# Patient Record
Sex: Male | Born: 1965 | Race: Black or African American | Hispanic: No | Marital: Single | Smoking: Current every day smoker
Health system: Southern US, Community
[De-identification: ages and names within clinical notes are randomized; demographics above are authoritative.]

## PROBLEM LIST (undated history)

## (undated) DIAGNOSIS — R569 Unspecified convulsions: Secondary | ICD-10-CM

## (undated) DIAGNOSIS — I10 Essential (primary) hypertension: Secondary | ICD-10-CM

## (undated) DIAGNOSIS — Z87442 Personal history of urinary calculi: Secondary | ICD-10-CM

## (undated) DIAGNOSIS — I509 Heart failure, unspecified: Secondary | ICD-10-CM

## (undated) DIAGNOSIS — J189 Pneumonia, unspecified organism: Secondary | ICD-10-CM

## (undated) DIAGNOSIS — E871 Hypo-osmolality and hyponatremia: Secondary | ICD-10-CM

## (undated) DIAGNOSIS — J449 Chronic obstructive pulmonary disease, unspecified: Secondary | ICD-10-CM

## (undated) DIAGNOSIS — F200 Paranoid schizophrenia: Secondary | ICD-10-CM

## (undated) DIAGNOSIS — Z720 Tobacco use: Secondary | ICD-10-CM

## (undated) HISTORY — PX: KIDNEY STONE SURGERY: SHX686

---

## 2020-01-19 ENCOUNTER — Emergency Department (HOSPITAL_COMMUNITY)
Admission: EM | Admit: 2020-01-19 | Discharge: 2020-01-19 | Disposition: A | Discharge status: Home / Self Care | Payer: Medicare PPO | Source: Home / Self Care | Attending: Emergency Medicine | Admitting: Emergency Medicine

## 2020-01-19 ENCOUNTER — Encounter (HOSPITAL_COMMUNITY): Payer: Self-pay

## 2020-01-19 ENCOUNTER — Other Ambulatory Visit: Payer: Self-pay

## 2020-01-19 DIAGNOSIS — R200 Anesthesia of skin: Secondary | ICD-10-CM | POA: Diagnosis not present

## 2020-01-19 DIAGNOSIS — D649 Anemia, unspecified: Secondary | ICD-10-CM | POA: Insufficient documentation

## 2020-01-19 DIAGNOSIS — R55 Syncope and collapse: Secondary | ICD-10-CM | POA: Diagnosis not present

## 2020-01-19 DIAGNOSIS — Z760 Encounter for issue of repeat prescription: Secondary | ICD-10-CM | POA: Insufficient documentation

## 2020-01-19 DIAGNOSIS — J449 Chronic obstructive pulmonary disease, unspecified: Secondary | ICD-10-CM | POA: Insufficient documentation

## 2020-01-19 DIAGNOSIS — E871 Hypo-osmolality and hyponatremia: Secondary | ICD-10-CM | POA: Insufficient documentation

## 2020-01-19 HISTORY — DX: Heart failure, unspecified: I50.9

## 2020-01-19 HISTORY — DX: Chronic obstructive pulmonary disease, unspecified: J44.9

## 2020-01-19 LAB — CBC
HCT: 28.7 % — ABNORMAL LOW (ref 39.0–52.0)
Hemoglobin: 8.8 g/dL — ABNORMAL LOW (ref 13.0–17.0)
MCH: 19.5 pg — ABNORMAL LOW (ref 26.0–34.0)
MCHC: 30.7 g/dL (ref 30.0–36.0)
MCV: 63.6 fL — ABNORMAL LOW (ref 80.0–100.0)
Platelets: 217 10*3/uL (ref 150–400)
RBC: 4.51 MIL/uL (ref 4.22–5.81)
RDW: 24.6 % — ABNORMAL HIGH (ref 11.5–15.5)
WBC: 7.2 10*3/uL (ref 4.0–10.5)
nRBC: 0.4 % — ABNORMAL HIGH (ref 0.0–0.2)

## 2020-01-19 LAB — BASIC METABOLIC PANEL
Anion gap: 10 (ref 5–15)
BUN: 11 mg/dL (ref 6–20)
CO2: 23 mmol/L (ref 22–32)
Calcium: 8.3 mg/dL — ABNORMAL LOW (ref 8.9–10.3)
Chloride: 93 mmol/L — ABNORMAL LOW (ref 98–111)
Creatinine, Ser: 0.65 mg/dL (ref 0.61–1.24)
GFR calc Af Amer: >60 mL/min (ref >60)
GFR calc non Af Amer: >60 mL/min (ref >60)
Glucose, Bld: 92 mg/dL (ref 70–99)
Potassium: 4.2 mmol/L (ref 3.5–5.1)
Sodium: 126 mmol/L — ABNORMAL LOW (ref 135–145)

## 2020-01-19 MED ORDER — METOPROLOL TARTRATE 25 MG PO TABS
50.0000 mg | ORAL_TABLET | Freq: Two times a day (BID) | ORAL | 1 refills | Status: DC
Start: 2020-01-19 — End: 2020-01-27

## 2020-01-19 MED ORDER — ALBUTEROL SULFATE HFA 108 (90 BASE) MCG/ACT IN AERS: 1.0000 | INHALATION_SPRAY | Freq: Four times a day (QID) | RESPIRATORY_TRACT | 1 refills | Status: DC | PRN

## 2020-01-19 MED ORDER — ALBUTEROL SULFATE HFA 108 (90 BASE) MCG/ACT IN AERS
4.0000 | INHALATION_SPRAY | Freq: Once | RESPIRATORY_TRACT | Status: AC
  Administered 2020-01-19: 4 via RESPIRATORY_TRACT
  Filled 2020-01-19: qty 6.7

## 2020-01-19 MED ORDER — LISINOPRIL-HYDROCHLOROTHIAZIDE 20-25 MG PO TABS: 1.0000 | ORAL_TABLET | Freq: Every day | ORAL | 1 refills | Status: DC

## 2020-01-19 NOTE — Discharge Instructions (Addendum)
Take the medications as prescribed.  Follow-up with a primary care doctor to further evaluate your anemia

## 2020-01-19 NOTE — ED Provider Notes (Signed)
[facility] EMERGENCY DEPARTMENT Provider Note   CSN: [id number] Arrival date & time: 01/19/20  1350     History Chief Complaint  Patient presents with  . Numbness    Jim Dunn is a 54 y.o. male.  HPI   Pt has history of COPD and CHF.  PT states he needs to get his medications refilled.  His doctor did not refill all of his medications.  Pt does not have his inhaler.  He also does not have his blood pressure medications, lisinopril (20/25) and metoprolol 25 mg bid.  Pt states he is not feeling short of breath. He denies fever or chills.  No chest pain .  No blood in the stool.  No concern for covid.  He took his last vaccination today.   Past Medical History:  Diagnosis Date  . CHF (congestive heart failure) (HCC)   . COPD (chronic obstructive pulmonary disease) (HCC)     There are no problems to display for this patient.   History reviewed. No pertinent surgical history.     History reviewed. No pertinent family history.  Social History   Tobacco Use  . Smoking status: Not on file  Substance Use Topics  . Alcohol use: Not on file  . Drug use: Not on file    Home Medications Prior to Admission medications   Medication Sig Start Date End Date Taking? Authorizing Provider  albuterol (VENTOLIN HFA) 108 (90 Base) MCG/ACT inhaler Inhale 1-2 puffs into the lungs every 6 (six) hours as needed for wheezing or shortness of breath. 01/19/20   Linwood Dibbles, MD  lisinopril-hydrochlorothiazide (ZESTORETIC) 20-25 MG tablet Take 1 tablet by mouth daily. 01/19/20   Linwood Dibbles, MD  metoprolol tartrate (LOPRESSOR) 25 MG tablet Take 2 tablets (50 mg total) by mouth 2 (two) times daily. 01/19/20   Linwood Dibbles, MD    Allergies    Patient has no allergy information on record.  Review of Systems   Review of Systems  All other systems reviewed and are negative.   Physical Exam Updated Vital Signs BP (!) 137/103 (BP Location: Left Arm)   Pulse (!) 110   Temp 98.6  F (37 C) (Oral)   Resp 18   SpO2 94%   Physical Exam Vitals and nursing note reviewed.  Constitutional:      General: He is not in acute distress.    Appearance: He is well-developed.  HENT:     Head: Normocephalic and atraumatic.     Right Ear: External ear normal.     Left Ear: External ear normal.  Eyes:     General: No scleral icterus.       Right eye: No discharge.        Left eye: No discharge.     Conjunctiva/sclera: Conjunctivae normal.  Neck:     Trachea: No tracheal deviation.  Cardiovascular:     Rate and Rhythm: Normal rate and regular rhythm.  Pulmonary:     Effort: No respiratory distress.     Breath sounds: Normal breath sounds. No stridor. No wheezing, rhonchi or rales.  Abdominal:     General: Bowel sounds are normal. There is no distension.     Palpations: Abdomen is soft.     Tenderness: There is no abdominal tenderness. There is no guarding or rebound.  Musculoskeletal:        General: No tenderness.     Cervical back: Neck supple.     Right lower leg: Edema present.  Left lower leg: Edema present.     Comments: Palpable dp pulses  Skin:    General: Skin is warm and dry.     Findings: No rash.  Neurological:     Mental Status: He is alert.     Cranial Nerves: No cranial nerve deficit (no facial droop, extraocular movements intact, no slurred speech).     Sensory: No sensory deficit.     Motor: No abnormal muscle tone or seizure activity.     Coordination: Coordination normal.     ED Results / Procedures / Treatments   Labs (all labs ordered are listed, but only abnormal results are displayed) Labs Reviewed  BASIC METABOLIC PANEL - Abnormal; Notable for the following components:      Result Value   Sodium 126 (*)    Chloride 93 (*)    Calcium 8.3 (*)    All other components within normal limits  CBC - Abnormal; Notable for the following components:   Hemoglobin 8.8 (*)    HCT 28.7 (*)    MCV 63.6 (*)    MCH 19.5 (*)    RDW 24.6 (*)     nRBC 0.4 (*)    All other components within normal limits    EKG None  Radiology No results found.  Procedures Procedures (including critical care time)  Medications Ordered in ED Medications  albuterol (VENTOLIN HFA) 108 (90 Base) MCG/ACT inhaler 4 puff (has no administration in time range)    ED Course  I have reviewed the triage vital signs and the nursing notes.  Pertinent labs & imaging results that were available during my care of the patient were reviewed by me and considered in my medical decision making (see chart for details).  Clinical Course as of Jan 19 2132  Mon Jan 19, 2020  2122 Labs reviewed.  Prior sodium was 129.  However, prior hemoglobin was 11.  His hemoglobin of 8.8 is new.  Prior labs were from 2019 however.   [JK]    Clinical Course User Index [JK] Linwood Dibbles, MD   MDM Rules/Calculators/A&P                     Hgb 11.8 in Jan 2019.  Na is 129.   Pt does not want to have a cxr.  Discussed doing a rectal exam to assess his anemia.  Pt refuses rectal exam  Patient presented to the ED with a request a refill of his medications.  Patient does have history of chronic COPD.  He does smoke.  He also has CHF.  Patient denies any respiratory difficulty.  He did seem to have some mild tachypnea but he states this is his baseline.  I asked the patient about doing a chest x-ray and he does not feel that is necessary.  He did have labs at triage that shows anemia and hyponatremia.  I was able to access records from 2019 at another medical facility through the EMR and the hyponatremia is chronic.  His anemia is new.  Patient denies any rectal bleeding or dark stools.  He does not want to have a rectal exam.  I encouraged outpatient follow-up.  I will give him a refill of his metoprolol and his lisinopril HCTZ as he requests.  He will also be given an albuterol inhaler here.  Final Clinical Impression(s) / ED Diagnoses Final diagnoses:  Medication refill  Anemia,  unspecified type  Hyponatremia    Rx / DC Orders ED  Discharge Orders         Ordered    metoprolol tartrate (LOPRESSOR) 25 MG tablet  2 times daily     01/19/20 2131    lisinopril-hydrochlorothiazide (ZESTORETIC) 20-25 MG tablet  Daily     01/19/20 2131    albuterol (VENTOLIN HFA) 108 (90 Base) MCG/ACT inhaler  Every 6 hours PRN     01/19/20 2132           Dorie Rank, MD 01/19/20 2133

## 2020-01-19 NOTE — ED Notes (Signed)
Pt refused repeat VS reassessment and refused repeat physical assessment. Pt was instructed on discharge instructions and discharged ambulatory to home after Albuterol Administration.

## 2020-01-19 NOTE — ED Triage Notes (Signed)
[place] and states he needs refills of his inhaler and nebulizer. Pt has hx of COPD and CHF. States both his feet have been numb for the past month. Pt denies CP/SOB. Pt ambulatory.

## 2020-01-19 NOTE — ED Notes (Signed)
Patient verbalizes understanding of discharge instructions. Opportunity for questioning and answers were provided. Armband removed by staff, pt discharged from ED ambulatory to home.  

## 2020-01-21 ENCOUNTER — Other Ambulatory Visit: Payer: Self-pay

## 2020-01-21 ENCOUNTER — Inpatient Hospital Stay (HOSPITAL_COMMUNITY)
Admission: EM | Admit: 2020-01-21 | Discharge: 2020-01-27 | DRG: 312 | Disposition: A | Discharge status: Skilled Nursing Facility | Payer: Medicare PPO | Attending: Internal Medicine | Admitting: Internal Medicine

## 2020-01-21 DIAGNOSIS — D509 Iron deficiency anemia, unspecified: Secondary | ICD-10-CM

## 2020-01-21 DIAGNOSIS — J90 Pleural effusion, not elsewhere classified: Secondary | ICD-10-CM | POA: Diagnosis present

## 2020-01-21 DIAGNOSIS — Z79899 Other long term (current) drug therapy: Secondary | ICD-10-CM

## 2020-01-21 DIAGNOSIS — Z91138 Patient's unintentional underdosing of medication regimen for other reason: Secondary | ICD-10-CM

## 2020-01-21 DIAGNOSIS — I110 Hypertensive heart disease with heart failure: Secondary | ICD-10-CM | POA: Diagnosis present

## 2020-01-21 DIAGNOSIS — R778 Other specified abnormalities of plasma proteins: Secondary | ICD-10-CM

## 2020-01-21 DIAGNOSIS — J441 Chronic obstructive pulmonary disease with (acute) exacerbation: Secondary | ICD-10-CM | POA: Diagnosis present

## 2020-01-21 DIAGNOSIS — E871 Hypo-osmolality and hyponatremia: Secondary | ICD-10-CM

## 2020-01-21 DIAGNOSIS — E785 Hyperlipidemia, unspecified: Secondary | ICD-10-CM | POA: Diagnosis present

## 2020-01-21 DIAGNOSIS — J449 Chronic obstructive pulmonary disease, unspecified: Secondary | ICD-10-CM | POA: Diagnosis present

## 2020-01-21 DIAGNOSIS — N179 Acute kidney failure, unspecified: Secondary | ICD-10-CM

## 2020-01-21 DIAGNOSIS — T463X6A Underdosing of coronary vasodilators, initial encounter: Secondary | ICD-10-CM | POA: Diagnosis present

## 2020-01-21 DIAGNOSIS — E876 Hypokalemia: Secondary | ICD-10-CM | POA: Diagnosis present

## 2020-01-21 DIAGNOSIS — R0989 Other specified symptoms and signs involving the circulatory and respiratory systems: Secondary | ICD-10-CM | POA: Diagnosis present

## 2020-01-21 DIAGNOSIS — R55 Syncope and collapse: Principal | ICD-10-CM

## 2020-01-21 DIAGNOSIS — E782 Mixed hyperlipidemia: Secondary | ICD-10-CM | POA: Diagnosis present

## 2020-01-21 DIAGNOSIS — I5023 Acute on chronic systolic (congestive) heart failure: Secondary | ICD-10-CM | POA: Diagnosis present

## 2020-01-21 DIAGNOSIS — T464X6A Underdosing of angiotensin-converting-enzyme inhibitors, initial encounter: Secondary | ICD-10-CM | POA: Diagnosis present

## 2020-01-21 DIAGNOSIS — Z791 Long term (current) use of non-steroidal anti-inflammatories (NSAID): Secondary | ICD-10-CM

## 2020-01-21 DIAGNOSIS — T447X6A Underdosing of beta-adrenoreceptor antagonists, initial encounter: Secondary | ICD-10-CM | POA: Diagnosis present

## 2020-01-21 DIAGNOSIS — Z7982 Long term (current) use of aspirin: Secondary | ICD-10-CM

## 2020-01-21 DIAGNOSIS — I071 Rheumatic tricuspid insufficiency: Secondary | ICD-10-CM | POA: Diagnosis present

## 2020-01-21 DIAGNOSIS — Z7951 Long term (current) use of inhaled steroids: Secondary | ICD-10-CM

## 2020-01-21 DIAGNOSIS — F17210 Nicotine dependence, cigarettes, uncomplicated: Secondary | ICD-10-CM | POA: Diagnosis present

## 2020-01-21 DIAGNOSIS — Z20822 Contact with and (suspected) exposure to covid-19: Secondary | ICD-10-CM | POA: Diagnosis present

## 2020-01-21 DIAGNOSIS — I5022 Chronic systolic (congestive) heart failure: Secondary | ICD-10-CM | POA: Diagnosis present

## 2020-01-21 DIAGNOSIS — G40909 Epilepsy, unspecified, not intractable, without status epilepticus: Secondary | ICD-10-CM

## 2020-01-21 DIAGNOSIS — Z590 Homelessness: Secondary | ICD-10-CM

## 2020-01-21 DIAGNOSIS — I428 Other cardiomyopathies: Secondary | ICD-10-CM | POA: Diagnosis present

## 2020-01-21 DIAGNOSIS — F200 Paranoid schizophrenia: Secondary | ICD-10-CM | POA: Diagnosis present

## 2020-01-21 DIAGNOSIS — T465X6A Underdosing of other antihypertensive drugs, initial encounter: Secondary | ICD-10-CM | POA: Diagnosis present

## 2020-01-21 DIAGNOSIS — R7989 Other specified abnormal findings of blood chemistry: Secondary | ICD-10-CM

## 2020-01-21 HISTORY — DX: Paranoid schizophrenia: F20.0

## 2020-01-21 HISTORY — DX: Tobacco use: Z72.0

## 2020-01-21 HISTORY — DX: Personal history of urinary calculi: Z87.442

## 2020-01-21 HISTORY — DX: Unspecified convulsions: R56.9

## 2020-01-21 LAB — BASIC METABOLIC PANEL
Anion gap: 12 (ref 5–15)
BUN: 35 mg/dL — ABNORMAL HIGH (ref 6–20)
CO2: 21 mmol/L — ABNORMAL LOW (ref 22–32)
Calcium: 8.5 mg/dL — ABNORMAL LOW (ref 8.9–10.3)
Chloride: 92 mmol/L — ABNORMAL LOW (ref 98–111)
Creatinine, Ser: 1.35 mg/dL — ABNORMAL HIGH (ref 0.61–1.24)
GFR calc Af Amer: >60 mL/min (ref >60)
GFR calc non Af Amer: >60 mL/min (ref >60)
Glucose, Bld: 111 mg/dL — ABNORMAL HIGH (ref 70–99)
Potassium: 4.8 mmol/L (ref 3.5–5.1)
Sodium: 125 mmol/L — ABNORMAL LOW (ref 135–145)

## 2020-01-21 LAB — CBC
HCT: 27.2 % — ABNORMAL LOW (ref 39.0–52.0)
Hemoglobin: 8.5 g/dL — ABNORMAL LOW (ref 13.0–17.0)
MCH: 19.8 pg — ABNORMAL LOW (ref 26.0–34.0)
MCHC: 31.3 g/dL (ref 30.0–36.0)
MCV: 63.3 fL — ABNORMAL LOW (ref 80.0–100.0)
Platelets: 201 10*3/uL (ref 150–400)
RBC: 4.3 MIL/uL (ref 4.22–5.81)
RDW: 24.2 % — ABNORMAL HIGH (ref 11.5–15.5)
WBC: 10.5 10*3/uL (ref 4.0–10.5)
nRBC: 0.4 % — ABNORMAL HIGH (ref 0.0–0.2)

## 2020-01-21 LAB — CBG MONITORING, ED: Glucose-Capillary: 116 mg/dL — ABNORMAL HIGH (ref 70–99)

## 2020-01-21 MED ORDER — SODIUM CHLORIDE 0.9% FLUSH: 3.0000 mL | Freq: Once | INTRAVENOUS | Status: DC

## 2020-01-21 NOTE — ED Triage Notes (Addendum)
Pt presents to ED BIB [facility]. Pt c/o syncope while walking. Unknown head trauma, no blood thinners. No complaints. Pt has edema in abdomen and legs. 90% on RA, EMS placed on 3L. Pt says that he had a syncopal episode Tuesday as well. EMS VS: 92/60 120 - HR 99% on 3L [place] CBG: 127

## 2020-01-22 ENCOUNTER — Emergency Department (HOSPITAL_COMMUNITY): Payer: Medicare PPO

## 2020-01-22 ENCOUNTER — Inpatient Hospital Stay (HOSPITAL_COMMUNITY): Payer: Medicare PPO

## 2020-01-22 ENCOUNTER — Encounter (HOSPITAL_COMMUNITY): Payer: Self-pay | Admitting: Internal Medicine

## 2020-01-22 DIAGNOSIS — J449 Chronic obstructive pulmonary disease, unspecified: Secondary | ICD-10-CM | POA: Diagnosis present

## 2020-01-22 DIAGNOSIS — D509 Iron deficiency anemia, unspecified: Secondary | ICD-10-CM | POA: Diagnosis present

## 2020-01-22 DIAGNOSIS — T463X6A Underdosing of coronary vasodilators, initial encounter: Secondary | ICD-10-CM | POA: Diagnosis present

## 2020-01-22 DIAGNOSIS — Z7951 Long term (current) use of inhaled steroids: Secondary | ICD-10-CM | POA: Diagnosis not present

## 2020-01-22 DIAGNOSIS — I5031 Acute diastolic (congestive) heart failure: Secondary | ICD-10-CM

## 2020-01-22 DIAGNOSIS — E871 Hypo-osmolality and hyponatremia: Secondary | ICD-10-CM | POA: Diagnosis present

## 2020-01-22 DIAGNOSIS — N179 Acute kidney failure, unspecified: Secondary | ICD-10-CM | POA: Diagnosis present

## 2020-01-22 DIAGNOSIS — Z791 Long term (current) use of non-steroidal anti-inflammatories (NSAID): Secondary | ICD-10-CM | POA: Diagnosis not present

## 2020-01-22 DIAGNOSIS — Z20822 Contact with and (suspected) exposure to covid-19: Secondary | ICD-10-CM | POA: Diagnosis present

## 2020-01-22 DIAGNOSIS — R200 Anesthesia of skin: Secondary | ICD-10-CM | POA: Diagnosis present

## 2020-01-22 DIAGNOSIS — I5023 Acute on chronic systolic (congestive) heart failure: Secondary | ICD-10-CM | POA: Diagnosis present

## 2020-01-22 DIAGNOSIS — E876 Hypokalemia: Secondary | ICD-10-CM | POA: Diagnosis present

## 2020-01-22 DIAGNOSIS — Z590 Homelessness: Secondary | ICD-10-CM | POA: Diagnosis not present

## 2020-01-22 DIAGNOSIS — I110 Hypertensive heart disease with heart failure: Secondary | ICD-10-CM | POA: Diagnosis present

## 2020-01-22 DIAGNOSIS — G40909 Epilepsy, unspecified, not intractable, without status epilepticus: Secondary | ICD-10-CM | POA: Diagnosis present

## 2020-01-22 DIAGNOSIS — Z79899 Other long term (current) drug therapy: Secondary | ICD-10-CM | POA: Diagnosis not present

## 2020-01-22 DIAGNOSIS — E782 Mixed hyperlipidemia: Secondary | ICD-10-CM | POA: Diagnosis present

## 2020-01-22 DIAGNOSIS — R55 Syncope and collapse: Principal | ICD-10-CM

## 2020-01-22 DIAGNOSIS — I428 Other cardiomyopathies: Secondary | ICD-10-CM | POA: Diagnosis present

## 2020-01-22 DIAGNOSIS — T447X6A Underdosing of beta-adrenoreceptor antagonists, initial encounter: Secondary | ICD-10-CM | POA: Diagnosis present

## 2020-01-22 DIAGNOSIS — F17210 Nicotine dependence, cigarettes, uncomplicated: Secondary | ICD-10-CM | POA: Diagnosis present

## 2020-01-22 DIAGNOSIS — T465X6A Underdosing of other antihypertensive drugs, initial encounter: Secondary | ICD-10-CM | POA: Diagnosis present

## 2020-01-22 DIAGNOSIS — F200 Paranoid schizophrenia: Secondary | ICD-10-CM | POA: Diagnosis present

## 2020-01-22 DIAGNOSIS — R0989 Other specified symptoms and signs involving the circulatory and respiratory systems: Secondary | ICD-10-CM | POA: Diagnosis present

## 2020-01-22 DIAGNOSIS — I5022 Chronic systolic (congestive) heart failure: Secondary | ICD-10-CM | POA: Diagnosis present

## 2020-01-22 DIAGNOSIS — E785 Hyperlipidemia, unspecified: Secondary | ICD-10-CM | POA: Diagnosis present

## 2020-01-22 DIAGNOSIS — J90 Pleural effusion, not elsewhere classified: Secondary | ICD-10-CM | POA: Diagnosis present

## 2020-01-22 DIAGNOSIS — J441 Chronic obstructive pulmonary disease with (acute) exacerbation: Secondary | ICD-10-CM | POA: Diagnosis present

## 2020-01-22 DIAGNOSIS — Z7982 Long term (current) use of aspirin: Secondary | ICD-10-CM | POA: Diagnosis not present

## 2020-01-22 LAB — URINALYSIS, ROUTINE W REFLEX MICROSCOPIC
Bacteria, UA: NONE SEEN
Bilirubin Urine: NEGATIVE
Glucose, UA: NEGATIVE mg/dL
Hgb urine dipstick: NEGATIVE
Ketones, ur: NEGATIVE mg/dL
Leukocytes,Ua: NEGATIVE
Nitrite: NEGATIVE
Protein, ur: 30 mg/dL — AB
Specific Gravity, Urine: 1.02 (ref 1.005–1.030)
pH: 5 (ref 5.0–8.0)

## 2020-01-22 LAB — TROPONIN I (HIGH SENSITIVITY)
Troponin I (High Sensitivity): 66 ng/L — ABNORMAL HIGH (ref <18)
Troponin I (High Sensitivity): 56 ng/L — ABNORMAL HIGH (ref <18)

## 2020-01-22 LAB — ECHOCARDIOGRAM COMPLETE

## 2020-01-22 LAB — SARS CORONAVIRUS 2 BY RT PCR (HOSPITAL ORDER, PERFORMED IN ~~LOC~~ HOSPITAL LAB): SARS Coronavirus 2: NEGATIVE

## 2020-01-22 LAB — BRAIN NATRIURETIC PEPTIDE: B Natriuretic Peptide: 697.9 pg/mL — ABNORMAL HIGH (ref 0.0–100.0)

## 2020-01-22 LAB — RAPID URINE DRUG SCREEN, HOSP PERFORMED
Amphetamines: NOT DETECTED
Barbiturates: NOT DETECTED
Benzodiazepines: POSITIVE — AB
Cocaine: NOT DETECTED
Opiates: NOT DETECTED
Tetrahydrocannabinol: NOT DETECTED

## 2020-01-22 LAB — HIV ANTIBODY (ROUTINE TESTING W REFLEX): HIV Screen 4th Generation wRfx: NONREACTIVE

## 2020-01-22 MED ORDER — PHENYTOIN SODIUM EXTENDED 100 MG PO CAPS
100.0000 mg | ORAL_CAPSULE | Freq: Three times a day (TID) | ORAL | Status: DC
  Administered 2020-01-22 – 2020-01-27 (×16): 100 mg via ORAL
  Filled 2020-01-22 (×18): qty 1

## 2020-01-22 MED ORDER — ALBUTEROL SULFATE (2.5 MG/3ML) 0.083% IN NEBU: 2.5000 mg | INHALATION_SOLUTION | Freq: Four times a day (QID) | RESPIRATORY_TRACT | Status: DC | PRN

## 2020-01-22 MED ORDER — QUETIAPINE FUMARATE 50 MG PO TABS
300.0000 mg | ORAL_TABLET | Freq: Every day | ORAL | Status: DC
  Administered 2020-01-22 – 2020-01-23 (×2): 300 mg via ORAL
  Filled 2020-01-22: qty 2
  Filled 2020-01-22: qty 6

## 2020-01-22 MED ORDER — AZITHROMYCIN 250 MG PO TABS
500.0000 mg | ORAL_TABLET | Freq: Every day | ORAL | Status: DC
  Administered 2020-01-22 – 2020-01-26 (×5): 500 mg via ORAL
  Filled 2020-01-22 (×5): qty 2

## 2020-01-22 MED ORDER — ZOLPIDEM TARTRATE 5 MG PO TABS: 10.0000 mg | ORAL_TABLET | Freq: Every evening | ORAL | Status: DC | PRN

## 2020-01-22 MED ORDER — GUAIFENESIN ER 600 MG PO TB12
600.0000 mg | ORAL_TABLET | Freq: Two times a day (BID) | ORAL | Status: DC
  Administered 2020-01-22 – 2020-01-27 (×11): 600 mg via ORAL
  Filled 2020-01-22 (×11): qty 1

## 2020-01-22 MED ORDER — METHYLPREDNISOLONE SODIUM SUCC 125 MG IJ SOLR
125.0000 mg | Freq: Once | INTRAMUSCULAR | Status: AC
  Administered 2020-01-22: 125 mg via INTRAVENOUS
  Filled 2020-01-22: qty 2

## 2020-01-22 MED ORDER — ACETAMINOPHEN 325 MG PO TABS
650.0000 mg | ORAL_TABLET | ORAL | Status: DC | PRN
  Administered 2020-01-24 – 2020-01-26 (×4): 650 mg via ORAL
  Filled 2020-01-22 (×4): qty 2

## 2020-01-22 MED ORDER — ALPRAZOLAM 0.25 MG PO TABS: 0.5000 mg | ORAL_TABLET | Freq: Two times a day (BID) | ORAL | Status: DC | PRN

## 2020-01-22 MED ORDER — GABAPENTIN 300 MG PO CAPS
300.0000 mg | ORAL_CAPSULE | Freq: Three times a day (TID) | ORAL | Status: DC
  Administered 2020-01-22 – 2020-01-27 (×16): 300 mg via ORAL
  Filled 2020-01-22 (×16): qty 1

## 2020-01-22 MED ORDER — BUDESONIDE 0.5 MG/2ML IN SUSP
0.5000 mg | Freq: Two times a day (BID) | RESPIRATORY_TRACT | Status: DC
  Administered 2020-01-22 – 2020-01-27 (×10): 0.5 mg via RESPIRATORY_TRACT
  Filled 2020-01-22 (×12): qty 2

## 2020-01-22 MED ORDER — CARVEDILOL 3.125 MG PO TABS
3.1250 mg | ORAL_TABLET | Freq: Two times a day (BID) | ORAL | Status: DC
  Administered 2020-01-22 – 2020-01-27 (×10): 3.125 mg via ORAL
  Filled 2020-01-22 (×12): qty 1

## 2020-01-22 MED ORDER — ASPIRIN EC 81 MG PO TBEC
81.0000 mg | DELAYED_RELEASE_TABLET | Freq: Every day | ORAL | Status: DC
  Administered 2020-01-22 – 2020-01-27 (×6): 81 mg via ORAL
  Filled 2020-01-22 (×6): qty 1

## 2020-01-22 MED ORDER — NICOTINE 21 MG/24HR TD PT24
21.0000 mg | MEDICATED_PATCH | Freq: Every day | TRANSDERMAL | Status: DC
  Administered 2020-01-22: 21 mg via TRANSDERMAL
  Filled 2020-01-22 (×5): qty 1

## 2020-01-22 MED ORDER — ATORVASTATIN CALCIUM 40 MG PO TABS
40.0000 mg | ORAL_TABLET | Freq: Every day | ORAL | Status: DC
  Administered 2020-01-22 – 2020-01-27 (×6): 40 mg via ORAL
  Filled 2020-01-22 (×6): qty 1

## 2020-01-22 MED ORDER — ALBUTEROL SULFATE (2.5 MG/3ML) 0.083% IN NEBU
2.5000 mg | INHALATION_SOLUTION | RESPIRATORY_TRACT | Status: AC
  Administered 2020-01-22: 2.5 mg via RESPIRATORY_TRACT
  Filled 2020-01-22: qty 3

## 2020-01-22 MED ORDER — ARFORMOTEROL TARTRATE 15 MCG/2ML IN NEBU
15.0000 ug | INHALATION_SOLUTION | Freq: Two times a day (BID) | RESPIRATORY_TRACT | Status: DC
  Administered 2020-01-22 – 2020-01-27 (×10): 15 ug via RESPIRATORY_TRACT
  Filled 2020-01-22 (×13): qty 2

## 2020-01-22 MED ORDER — SODIUM CHLORIDE 0.9 % IV SOLN: 250.0000 mL | INTRAVENOUS | Status: DC | PRN

## 2020-01-22 MED ORDER — SODIUM CHLORIDE 0.9% FLUSH: 3.0000 mL | INTRAVENOUS | Status: DC | PRN

## 2020-01-22 MED ORDER — ENOXAPARIN SODIUM 40 MG/0.4ML ~~LOC~~ SOLN
40.0000 mg | SUBCUTANEOUS | Status: DC
  Administered 2020-01-22 – 2020-01-27 (×6): 40 mg via SUBCUTANEOUS
  Filled 2020-01-22 (×6): qty 0.4

## 2020-01-22 MED ORDER — ALBUTEROL SULFATE (2.5 MG/3ML) 0.083% IN NEBU
2.5000 mg | INHALATION_SOLUTION | Freq: Four times a day (QID) | RESPIRATORY_TRACT | Status: DC
  Administered 2020-01-22 – 2020-01-23 (×3): 2.5 mg via RESPIRATORY_TRACT
  Filled 2020-01-22 (×3): qty 3

## 2020-01-22 MED ORDER — FUROSEMIDE 10 MG/ML IJ SOLN
20.0000 mg | Freq: Two times a day (BID) | INTRAMUSCULAR | Status: DC
  Administered 2020-01-22 – 2020-01-23 (×3): 20 mg via INTRAVENOUS
  Filled 2020-01-22 (×3): qty 2

## 2020-01-22 MED ORDER — ONDANSETRON HCL 4 MG/2ML IJ SOLN: 4.0000 mg | Freq: Four times a day (QID) | INTRAMUSCULAR | Status: DC | PRN

## 2020-01-22 MED ORDER — BUSPIRONE HCL 5 MG PO TABS
10.0000 mg | ORAL_TABLET | Freq: Three times a day (TID) | ORAL | Status: DC
  Administered 2020-01-22 – 2020-01-27 (×16): 10 mg via ORAL
  Filled 2020-01-22 (×16): qty 2

## 2020-01-22 MED ORDER — SODIUM CHLORIDE 0.9% FLUSH
3.0000 mL | Freq: Two times a day (BID) | INTRAVENOUS | Status: DC
  Administered 2020-01-22 – 2020-01-27 (×10): 3 mL via INTRAVENOUS

## 2020-01-22 NOTE — ED Notes (Signed)
O2 sat 85-87% while sleeping with audible congestion. Woken up and maintains the same for several minutes. O2 applied @ 2LPM via [place].

## 2020-01-22 NOTE — ED Notes (Signed)
Patient called out stating he spilled his coffee on the bed and floor; patient sheets and goen changed at this time. Patient provided with warm blankets and phone

## 2020-01-22 NOTE — ED Notes (Signed)
Transport here to take to Ultrasound- pt difficult to arouse- used sternal rub to awaken pt- BP 88/75- speech slurred, pt is oriented to place, and event, not to time or date.

## 2020-01-22 NOTE — Consult Note (Signed)
Reason for Consult: Decompensated systolic congestive heart failure Referring Physician:  [facility] hospitalist  Jim Dunn is an 54 y.o. male.  HPI: patient is 54 year old male with past medical history significant for hypertensive heart disease with systolic dysfunction, nonischemic dilated cardiomyopathy, history of recurrent congestive heart failure, resident of [place], [place], [place] approximately 20% as per patient, states was scheduled for ICD in the past, history of EtOH abuse in the past,hyperlipidemia, COPD, tobacco abuse, history of paranoid schizophrenia, seizure disorder,came to ER by [employer] had a fall while walking out of [employer].  Patient denies any seizure activity or syncopal episode.  Patient does give a history of PND, orthopnea and leg swelling for last few weeks.  States recently moved from [place] , [place] visiting his mom.  Patient denies any chest pain.  Denies nausea, vomiting, diaphoresis.  Denies palpitations, lightheadedness or syncope.  States has seen Dr. Lucianne Muss.  His cardiologist in [place], [place] and was scheduled for ICD in the past.  States had cardiac catheterization in the past and was noted to have no blockages.  Past Medical History:  Diagnosis Date  . CHF (congestive heart failure) (HCC)   . COPD (chronic obstructive pulmonary disease) (HCC)   . Paranoid schizophrenia (HCC)   . Seizures (HCC)   . Tobacco abuse     History reviewed. No pertinent surgical history.  Family History  Problem Relation Age of Onset  . Heart disease Other     Social History:  reports that he has been smoking cigarettes. He has been smoking about 1.00 pack per day. His smokeless tobacco use includes chew. He reports previous alcohol use. He reports that he does not use drugs.  Allergies: No Known Allergies  Medications: I have reviewed the patient's current medications.  Results for orders placed or performed during the hospital encounter of 01/21/20 (from  the past 48 hour(s))  CBG monitoring, ED     Status: Abnormal   Collection Time: 01/21/20  8:24 PM  Result Value Ref Range   Glucose-Capillary 116 (H) 70 - 99 mg/dL    Comment: Glucose reference range applies only to samples taken after fasting for at least 8 hours.  Basic metabolic panel     Status: Abnormal   Collection Time: 01/21/20  8:26 PM  Result Value Ref Range   Sodium 125 (L) 135 - 145 mmol/L   Potassium 4.8 3.5 - 5.1 mmol/L   Chloride 92 (L) 98 - 111 mmol/L   CO2 21 (L) 22 - 32 mmol/L   Glucose, Bld 111 (H) 70 - 99 mg/dL    Comment: Glucose reference range applies only to samples taken after fasting for at least 8 hours.   BUN 35 (H) 6 - 20 mg/dL   Creatinine, Ser 5.05 (H) 0.61 - 1.24 mg/dL   Calcium 8.5 (L) 8.9 - 10.3 mg/dL   GFR calc non Af Amer >60 >60 mL/min   GFR calc Af Amer >60 >60 mL/min   Anion gap 12 5 - 15    Comment: Performed at [facility], [address] [place], [place] [address]  CBC     Status: Abnormal   Collection Time: 01/21/20  8:26 PM  Result Value Ref Range   WBC 10.5 4.0 - 10.5 K/uL   RBC 4.30 4.22 - 5.81 MIL/uL   Hemoglobin 8.5 (L) 13.0 - 17.0 g/dL    Comment: Reticulocyte Hemoglobin testing may be clinically indicated, consider ordering this additional test [id number]  HCT 27.2 (L) 39.0 - 52.0 %   MCV 63.3 (L) 80.0 - 100.0 fL   MCH 19.8 (L) 26.0 - 34.0 pg   MCHC 31.3 30.0 - 36.0 g/dL   RDW 40.9 (H) 81.1 - 91.4 %   Platelets 201 150 - 400 K/uL    Comment: REPEATED TO VERIFY   nRBC 0.4 (H) 0.0 - 0.2 %    Comment: Performed at [facility], [address], [place], [place] [address]  Troponin I (High Sensitivity)     Status: Abnormal   Collection Time: 01/22/20  4:10 AM  Result Value Ref Range   Troponin I (High Sensitivity) 66 (H) <18 ng/L    Comment: (NOTE) Elevated high sensitivity troponin I (hsTnI) values and significant  changes across serial measurements may suggest ACS but many other  chronic and acute  conditions are known to elevate hsTnI results.  Refer to the "Links" section for chest pain algorithms and additional  guidance. Performed at [facility], [address] [place], [place] [address]   Brain natriuretic peptide     Status: Abnormal   Collection Time: 01/22/20  4:10 AM  Result Value Ref Range   B Natriuretic Peptide 697.9 (H) 0.0 - 100.0 pg/mL    Comment: Performed at [facility], [address] [place], [place] [address]  SARS Coronavirus 2 by RT PCR (hospital order, performed in [facility] hospital lab) Nasopharyngeal Nasopharyngeal Swab     Status: None   Collection Time: 01/22/20  4:10 AM   Specimen: Nasopharyngeal Swab  Result Value Ref Range   SARS Coronavirus 2 NEGATIVE NEGATIVE    Comment: (NOTE) SARS-CoV-2 target nucleic acids are NOT DETECTED. The SARS-CoV-2 RNA is generally detectable in upper and lower respiratory specimens during the acute phase of infection. The lowest concentration of SARS-CoV-2 viral copies this assay can detect is 250 copies / mL. A negative result does not preclude SARS-CoV-2 infection and should not be used as the sole basis for treatment or other patient management decisions.  A negative result may occur with improper specimen collection / handling, submission of specimen other than nasopharyngeal swab, presence of viral mutation(s) within the areas targeted by this assay, and inadequate number of viral copies (<250 copies / mL). A negative result must be combined with clinical observations, patient history, and epidemiological information. Fact Sheet for Patients:   BoilerBrush.com.cy Fact Sheet for Healthcare Providers: https://pope.com/ This test is not yet approved or cleared  by the [place] FDA and has been authorized for detection and/or diagnosis of SARS-CoV-2 by FDA under an Emergency Use Authorization (EUA).  This EUA will remain in effect (meaning this  test can be used) for the duration of the COVID-19 declaration under Section 564(b)(1) of the Act, 21 U.S.C. section 360bbb-3(b)(1), unless the authorization is terminated or revoked sooner. Performed at [facility], [address] [place], [place] [address]   Troponin I (High Sensitivity)     Status: Abnormal   Collection Time: 01/22/20  6:22 AM  Result Value Ref Range   Troponin I (High Sensitivity) 56 (H) <18 ng/L    Comment: (NOTE) Elevated high sensitivity troponin I (hsTnI) values and significant  changes across serial measurements may suggest ACS but many other  chronic and acute conditions are known to elevate hsTnI results.  Refer to the "Links" section for chest pain algorithms and additional  guidance. Performed at [facility], [address], [place], [place]  [address]   Urinalysis, Routine w reflex microscopic     Status: Abnormal   Collection Time: 01/22/20  6:33 AM  Result Value Ref Range   Color, Urine AMBER (A) YELLOW    Comment: BIOCHEMICALS MAY BE AFFECTED BY COLOR   APPearance HAZY (A) CLEAR   Specific Gravity, Urine 1.020 1.005 - 1.030   pH 5.0 5.0 - 8.0   Glucose, UA NEGATIVE NEGATIVE mg/dL   Hgb urine dipstick NEGATIVE NEGATIVE   Bilirubin Urine NEGATIVE NEGATIVE   Ketones, ur NEGATIVE NEGATIVE mg/dL   Protein, ur 30 (A) NEGATIVE mg/dL   Nitrite NEGATIVE NEGATIVE   Leukocytes,Ua NEGATIVE NEGATIVE   RBC / HPF 0-5 0 - 5 RBC/hpf   WBC, UA 0-5 0 - 5 WBC/hpf   Bacteria, UA NONE SEEN NONE SEEN   Squamous Epithelial / LPF 0-5 0 - 5   Mucus PRESENT    Hyaline Casts, UA PRESENT    Granular Casts, UA PRESENT     Comment: Performed at [facility], [address] [place], [place] [address]  HIV Antibody (routine testing w rflx)     Status: None   Collection Time: 01/22/20  8:40 AM  Result Value Ref Range   HIV Screen 4th Generation wRfx Non Reactive Non Reactive    Comment: Performed at [facility], [address],  [place], [place] [address]    US RENAL  Result Date: 01/22/2020 CLINICAL DATA:  Acute kidney injury.  Photo nerve Rea. EXAM: RENAL / URINARY TRACT ULTRASOUND COMPLETE COMPARISON:  None. FINDINGS: Right Kidney: Renal measurements: 12.0 x 4.4 x 6.9 cm = volume: 191 mL . Echogenicity within normal limits. No mass or hydronephrosis visualized. Left Kidney: Renal measurements: 11.4 x 6.8 x 5.9 cm = volume: 239 mL. Echogenicity within normal limits. No mass or hydronephrosis visualized. Bladder: Appears normal for degree of bladder distention. Other: Ascites and bilateral pleural effusions are noted. IMPRESSION: Slightly large but otherwise normal appearing kidneys. No focal lesion or hydronephrosis. Size could be within normal limits if the patient is a large person. Otherwise, there can be renal enlargement with either diabetic glomerular nephropathy or acute kidney injury. Ascites and pleural effusions. Electronically Signed   By: Paulina Fusi M.D.   On: 01/22/2020 10:29   DG Chest Port 1 View  Result Date: 01/22/2020 CLINICAL DATA:  Syncope EXAM: PORTABLE CHEST 1 VIEW COMPARISON:  None. FINDINGS: Cardiomegaly with vascular congestion. Moderate diffuse bilateral interstitial and ground-glass opacity, likely pulmonary edema. More confluent airspace disease at the bases. Possible right pleural effusion IMPRESSION: 1. Cardiomegaly with vascular congestion and diffuse bilateral interstitial and ground-glass opacity, probably reflecting edema 2. There is more confluent airspace disease at the right base, atelectasis versus pneumonia. Probable small right effusion Electronically Signed   By: Jasmine Pang M.D.   On: 01/22/2020 04:00    Review of Systems  Constitutional: Negative for diaphoresis and fever.  HENT: Negative for sore throat.   Respiratory: Positive for cough and shortness of breath. Negative for wheezing.   Cardiovascular: Positive for leg swelling. Negative for chest pain and palpitations.   Gastrointestinal: Positive for abdominal distention.  Musculoskeletal: Negative for back pain.  Neurological: Negative for dizziness and seizures.   Blood pressure (!) 104/91, pulse (!) 112, temperature 97.9 F (36.6 C), temperature source Oral, resp. rate 19, SpO2 99 %. Physical Exam  Constitutional: He is oriented to person, place, and time.  HENT:  Head: Normocephalic and atraumatic.  Eyes: Pupils are equal,  round, and reactive to light. Conjunctivae are normal. Left eye exhibits no discharge. No scleral icterus.  Neck: JVD present. No tracheal deviation present.  Cardiovascular:  Tachycardic, S1, S2.  Soft 2/6 systolic murmur and S3 gallop noted  Respiratory:  Decreased breath sounds at bases with bibasilar.  Rales present.  Occasional rhonchi noted  GI: Soft. Bowel sounds are normal. He exhibits distension. There is no abdominal tenderness.  Musculoskeletal:     Cervical back: Normal range of motion and neck supple.     Comments: No clubbing, cyanosis, 2+ edema with chronic ulcer noted  Neurological: He is oriented to person, place, and time.    Assessment/Plan: Acute decompensated systolic congestive heart failure. Minimally elevated high sensitivity troponin I is secondary to above Nonischemic dilated cardiomyopathy. Hypertensive heart disease with systolic dysfunction. Bronchitis, rule out pneumonia History of EtOH abuse. Tobacco abuse. COPD Hyperlipidemia Hypochromic microcytic anemia. Hyponatremia Paranoid  schizophrenia History of seizure disorder. Status post fall. Acute renal injury probably a component of cardiorenal syndrome Plan Check serial enzymes and EKG Check old records. Agree with present management Will restart low-dose carvedilol. Check renal function.  Hold ACE inhibitor today.  Will start entresto from tomorrow if renal function remained stable Check 2-D echo Anemia and Hyponatremia workup per primary team Charolette Forward 01/22/2020, 1:30 PM

## 2020-01-22 NOTE — ED Notes (Signed)
[name], mother, [phone] would like an update when available

## 2020-01-22 NOTE — H&P (Signed)
History and Physical    Jim Dunn TJQ:[id number] DOB: April 22, 1966 DOA: 01/21/2020  Referring MD/NP/PA: [name] Giovanni, MD PCP: System, Pcp Not In  Patient coming from: [facility]  Chief Complaint: Syncope  I have personally briefly reviewed patient's old medical records in [facility]   HPI: Jim Dunn is a 54 y.o. male with medical history significant of CHF, COPD, hyponatremia, schizophrenia, and seizure disorder presents after having a syncopal episode.  History is difficult to obtain from the patient.  Apparently, after leaving [employer] he was walking back to the car and felt like blood rushing to his head and had a near syncopal episode.  He reports that he never lost consciousness.  He had a similar episode like this occurred 2 days ago with standing.  Patient is originally from [facility] and that is where he receives all of his care.  He was here visiting his mom and notes that he ran out of all of his medication approximately 10 days ago and was unable to get his physician to refill them.  He tried to get medications refilled by his primary  Patient reports having associated symptoms of productive cough with yellowish sputum production, shortness of breath, wheezing, and lower extremity swelling.  He has a history of congestive heart failure and remembers being told at one point in time that his heart function was around 25%.  Patient's mother able to give additional history over the phone.  He was lying on the ground when she was able to find him and she suspects that he had passed out.  He  had been living with his mother since February of this year after becoming homeless.  Since that time she has been trying to track down his primary care provider to get refills of his medications, but has been unable to do so.  Reportedly the provider is under investigation and has not been able to be reached.  She reports that the patient smokes a pack or so of  cigarettes per day on average.  His mother notes that he is more than she can take care of at home and needs placement.   ED Course: Upon admission into the emergency department patient was noted to be afebrile, heart rate is 101-116, and all other vital signs maintained.  Labs significant for WBC 10.5, hemoglobin 8.5, sodium 125, BUN 35, creatinine 1.35, BNP 697.9, and troponin 66.  Chest x-ray showed cardiomegaly with pulmonary vascular congestion, signs of edema, and a small right pleural effusion.  TRH called to admit.  Review of Systems  Constitutional: Positive for malaise/fatigue. Negative for fever.  HENT: Negative for ear discharge and sinus pain.   Eyes: Negative for photophobia and pain.  Respiratory: Positive for cough, sputum production, shortness of breath and wheezing.   Cardiovascular: Positive for leg swelling. Negative for chest pain.  Gastrointestinal: Negative for abdominal pain, nausea and vomiting.  Genitourinary: Negative for hematuria and urgency.  Musculoskeletal: Positive for falls.  Skin: Negative for rash.  Neurological: Positive for dizziness. Negative for seizures.  Psychiatric/Behavioral: Negative for memory loss. The patient is nervous/anxious.     Past Medical History:  Diagnosis Date  . CHF (congestive heart failure) (HCC)   . COPD (chronic obstructive pulmonary disease) (HCC)   . Paranoid schizophrenia (HCC)   . Seizures (HCC)     History reviewed. No pertinent surgical history.   reports that he has been smoking cigarettes. He has been smoking about 1.00 pack  per day. His smokeless tobacco use includes chew. He reports previous alcohol use. He reports that he does not use drugs.  No Known Allergies  Family History  Problem Relation Age of Onset  . Heart disease Other     Prior to Admission medications   Medication Sig Start Date End Date Taking? Authorizing Provider  carvedilol (COREG) 25 MG tablet Take 1 tablet by mouth 2 (two) times daily.  10/02/17  Yes [provider]  albuterol (VENTOLIN HFA) 108 (90 Base) MCG/ACT inhaler Inhale 1-2 puffs into the lungs every 6 (six) hours as needed for wheezing or shortness of breath. 01/19/20   Dorie Rank, MD  ALPRAZolam Duanne Moron) 0.5 MG tablet Take 1 tablet by mouth 2 (two) times daily. 01/12/20   [provider]  aspirin (BAYER ASPIRIN EC LOW DOSE) 81 MG EC tablet Take 1 tablet by mouth daily.    [provider]  atorvastatin (LIPITOR) 40 MG tablet Take 1 tablet by mouth daily. 11/10/19   [provider]  benztropine (COGENTIN) 1 MG tablet Take 1 tablet by mouth daily.    [provider]  budesonide-formoterol (SYMBICORT) 160-4.5 MCG/ACT inhaler Inhale 2 puffs into the lungs 2 (two) times daily.    [provider]  busPIRone (BUSPAR) 10 MG tablet Take 10 mg by mouth 3 (three) times daily. 12/16/19   [provider]  gabapentin (NEURONTIN) 300 MG capsule Take 300 mg by mouth 3 (three) times daily. 01/16/20   [provider]  hydrALAZINE (APRESOLINE) 25 MG tablet Take 1 tablet by mouth 3 (three) times daily. 11/10/19   [provider]  INVEGA SUSTENNA 234 MG/1.5ML SUSY injection Inject 1.5 mLs into the muscle every 30 (thirty) days. 01/06/20   [provider]  lisinopril (ZESTRIL) 20 MG tablet Take 1 tablet by mouth daily. 11/10/19   [provider]  lisinopril-hydrochlorothiazide (ZESTORETIC) 20-25 MG tablet Take 1 tablet by mouth daily. 01/19/20   Dorie Rank, MD  losartan (COZAAR) 50 MG tablet Take 50 mg by mouth daily. 09/18/19   [provider]  meloxicam (MOBIC) 15 MG tablet Take 15 mg by mouth daily. 01/14/20   [provider]  metoprolol succinate (TOPROL-XL) 50 MG 24 hr tablet Take 1 tablet by mouth daily. 11/10/19   [provider]  metoprolol tartrate (LOPRESSOR) 25 MG tablet Take 2 tablets (50 mg total) by mouth 2 (two) times daily. 01/19/20   Dorie Rank, MD  phenytoin (DILANTIN) 100  MG ER capsule Take 1 capsule by mouth 3 (three) times daily. 11/10/19   [provider]  QUEtiapine (SEROQUEL) 300 MG tablet Take 1 tablet by mouth daily. 01/12/20   [provider]    Physical Exam:  Constitutional: Middle-age male who appears to be in no acute distress at this time   Vitals:   01/22/20 0800 01/22/20 0900 01/22/20 0941 01/22/20 0945  BP: (!) 87/69 93/74 118/81 (!) 104/91  Pulse: (!) 112 (!) 112    Resp: 16 13 19    Temp:      TempSrc:      SpO2: 96% 99%     Eyes: PERRL, lids and conjunctivae normal ENMT: Mucous membranes are moist. Posterior pharynx clear of any exudate or lesions.Normal dentition.  Neck: normal, supple, no masses, no thyromegaly.  JVD appreciated. Respiratory: Decreased overall aeration with positive wheeze and crackles appreciated..  Patient on 2 L nasal cannula oxygen with O2 saturations currently maintained. Cardiovascular: Regular rate and rhythm, no murmurs / rubs / gallops.  +  2 pitting lower extremity extremity edema. 2+ pedal pulses. No carotid bruits.  Abdomen: no tenderness, no masses palpated. No hepatosplenomegaly. Bowel sounds positive.  Musculoskeletal: no clubbing / cyanosis. No joint deformity upper and lower extremities. Good ROM, no contractures. Normal muscle tone.  Skin: no rashes, lesions, ulcers. No induration Neurologic: CN 2-12 grossly intact. Sensation intact, DTR normal. Strength 5/5 in all 4.  Psychiatric: Normal judgment and insight. Alert and oriented x 3. Normal mood.     Labs on Admission: I have personally reviewed following labs and imaging studies  CBC: Recent Labs  Lab 01/19/20 1415 01/21/20 2026  WBC 7.2 10.5  HGB 8.8* 8.5*  HCT 28.7* 27.2*  MCV 63.6* 63.3*  PLT 217 201   Basic Metabolic Panel: Recent Labs  Lab 01/19/20 1415 01/21/20 2026  NA 126* 125*  K 4.2 4.8  CL 93* 92*  CO2 23 21*  GLUCOSE 92 111*  BUN 11 35*  CREATININE 0.65 1.35*  CALCIUM 8.3* 8.5*   GFR: CrCl cannot  be calculated (Unknown ideal weight.). Liver Function Tests: No results for input(s): AST, ALT, ALKPHOS, BILITOT, PROT, ALBUMIN in the last 168 hours. No results for input(s): LIPASE, AMYLASE in the last 168 hours. No results for input(s): AMMONIA in the last 168 hours. Coagulation Profile: No results for input(s): INR, PROTIME in the last 168 hours. Cardiac Enzymes: No results for input(s): CKTOTAL, CKMB, CKMBINDEX, TROPONINI in the last 168 hours. BNP (last 3 results) No results for input(s): PROBNP in the last 8760 hours. HbA1C: No results for input(s): HGBA1C in the last 72 hours. CBG: Recent Labs  Lab 01/21/20 2024  GLUCAP 116*   Lipid Profile: No results for input(s): CHOL, HDL, LDLCALC, TRIG, CHOLHDL, LDLDIRECT in the last 72 hours. Thyroid Function Tests: No results for input(s): TSH, T4TOTAL, FREET4, T3FREE, THYROIDAB in the last 72 hours. Anemia Panel: No results for input(s): VITAMINB12, FOLATE, FERRITIN, TIBC, IRON, RETICCTPCT in the last 72 hours. Urine analysis:    Component Value Date/Time   COLORURINE AMBER (A) 01/22/2020 0633   APPEARANCEUR HAZY (A) 01/22/2020 0633   LABSPEC 1.020 01/22/2020 0633   PHURINE 5.0 01/22/2020 0633   GLUCOSEU NEGATIVE 01/22/2020 0633   HGBUR NEGATIVE 01/22/2020 0633   BILIRUBINUR NEGATIVE 01/22/2020 0633   KETONESUR NEGATIVE 01/22/2020 0633   PROTEINUR 30 (A) 01/22/2020 0633   NITRITE NEGATIVE 01/22/2020 0633   LEUKOCYTESUR NEGATIVE 01/22/2020 1610   Sepsis Labs: Recent Results (from the past 240 hour(s))  SARS Coronavirus 2 by RT PCR (hospital order, performed in [facility] hospital lab) Nasopharyngeal Nasopharyngeal Swab     Status: None   Collection Time: 01/22/20  4:10 AM   Specimen: Nasopharyngeal Swab  Result Value Ref Range Status   SARS Coronavirus 2 NEGATIVE NEGATIVE Final    Comment: (NOTE) SARS-CoV-2 target nucleic acids are NOT DETECTED. The SARS-CoV-2 RNA is generally detectable in upper and lower respiratory  specimens during the acute phase of infection. The lowest concentration of SARS-CoV-2 viral copies this assay can detect is 250 copies / mL. A negative result does not preclude SARS-CoV-2 infection and should not be used as the sole basis for treatment or other patient management decisions.  A negative result may occur with improper specimen collection / handling, submission of specimen other than nasopharyngeal swab, presence of viral mutation(s) within the areas targeted by this assay, and inadequate number of viral copies (<250 copies / mL). A negative result must be combined with clinical observations, patient history, and epidemiological information.  Fact Sheet for Patients:   BoilerBrush.com.cy Fact Sheet for Healthcare Providers: https://pope.com/ This test is not yet approved or cleared  by the [place] FDA and has been authorized for detection and/or diagnosis of SARS-CoV-2 by FDA under an Emergency Use Authorization (EUA).  This EUA will remain in effect (meaning this test can be used) for the duration of the COVID-19 declaration under Section 564(b)(1) of the Act, 21 U.S.C. section 360bbb-3(b)(1), unless the authorization is terminated or revoked sooner. Performed at [facility], [address] [place], [place] [address]      Radiological Exams on Admission: US RENAL  Result Date: 01/22/2020 CLINICAL DATA:  Acute kidney injury.  Photo nerve Rea. EXAM: RENAL / URINARY TRACT ULTRASOUND COMPLETE COMPARISON:  None. FINDINGS: Right Kidney: Renal measurements: 12.0 x 4.4 x 6.9 cm = volume: 191 mL . Echogenicity within normal limits. No mass or hydronephrosis visualized. Left Kidney: Renal measurements: 11.4 x 6.8 x 5.9 cm = volume: 239 mL. Echogenicity within normal limits. No mass or hydronephrosis visualized. Bladder: Appears normal for degree of bladder distention. Other: Ascites and bilateral pleural effusions are  noted. IMPRESSION: Slightly large but otherwise normal appearing kidneys. No focal lesion or hydronephrosis. Size could be within normal limits if the patient is a large person. Otherwise, there can be renal enlargement with either diabetic glomerular nephropathy or acute kidney injury. Ascites and pleural effusions. Electronically Signed   By: Paulina Fusi M.D.   On: 01/22/2020 10:29   DG Chest Port 1 View  Result Date: 01/22/2020 CLINICAL DATA:  Syncope EXAM: PORTABLE CHEST 1 VIEW COMPARISON:  None. FINDINGS: Cardiomegaly with vascular congestion. Moderate diffuse bilateral interstitial and ground-glass opacity, likely pulmonary edema. More confluent airspace disease at the bases. Possible right pleural effusion IMPRESSION: 1. Cardiomegaly with vascular congestion and diffuse bilateral interstitial and ground-glass opacity, probably reflecting edema 2. There is more confluent airspace disease at the right base, atelectasis versus pneumonia. Probable small right effusion Electronically Signed   By: Jasmine Pang M.D.   On: 01/22/2020 04:00    EKG: Independently reviewed.  Tachycardia 116 bpm with left posterior fascicular block and T wave inversions anterolaterally   Assessment/Plan Syncope/near syncope: Acute.  Patient reportedly got up and was walking to the car when he almost passed out.  From suspect secondary to orthostatic hypotension -Admit to a cardiac telemetry bed -Check echocardiogram  -Follow-up telemetry overnight     Systolic congestive heart failure exacerbation: Acute.  Reports being previously told EF was around 25%.  Noted to have been off his medications for at least the last 10 days.  Noted to have lower extremity swelling as well as JVD on physical exam.  Chest x-ray concerning for edema and possible pneumonia. -Heart failure orders set initiated  -Continuous pulse oximetry with nasal cannula oxygen as needed to keep O2 saturations >92% -Strict I&Os and daily weights -Elevate  lower extremities -Lasix 20 mg IV Bid -Reassess in a.m. and adjust diuresis as needed. -Check echocardiogram -Hold beta-blocker and ACE inhibitor due to hypotension and AKI. -Dr. Sharyn Lull of cardiology formally consulted  Suspected COPD exacerbation: Patient with significant history of tobacco abuse.  Noted to have O2 saturations around 90% on room air.  Patient noted not to be moving much air on physical exam. -Solu-Medrol 125 mg IV x1 dose now -Albuterol nebs 4 times daily -Brovana and budesonide nebs -Azithromycin day 1 of 5   Microcytic anemia: Acute.  Records note hemoglobin noted to be 8.5 on admission  and 8.8 on 5/17. -Check iron studies in a.m. -Consider need of iron transfusion.  Essential hypertension: On admission patient's blood pressures noted to be as low as 87/69 home blood pressure medications previously had included lisinopril hydrochlorothiazide 20-25 mg daily, Coreg 25 mg twice daily/metoprolol 50 mg daily, hydralazine 25 mg 3 times daily, isosorbide 20 mg twice daily.  It is not totally clear how long patient has been off these medications. -Holding home blood pressure medication due to soft pressures  Acute kidney injury: Patient's baseline creatinine previously noted to be 0.65, he presents with creatinine elevated up to 1.35 and BUN 35.  Urinalysis is relatively unremarkable. -Check renal ultrasound -Hold nephrotoxic agents -Continue to monitor kidney function with diuresis  Hyponatremia: Acute on chronic.  Sodium 125 on admission, but chronically reported to be low.  Suspecting a hypervolemic hyponatremia. -Continue to monitor sodium levels with diuresis  Paranoid schizophrenia: Home medications include Invega 1.5 mL injected into skin every 30 days, Seroquel 300 mg daily, Xanax 0.5 mg twice daily as needed for anxiety, and buspirone 10 mg 3 times daily. -Continue Seroquel, Xanax, buspirone  Seizure disorder: Patient has a history of seizure disorder.  Reports  being on phenytoin 100 mg 3 times daily for treatment.  Denies having any recent seizures. -Continue Phenytoin  Hyperlipidemia: Previous home medications include Lipitor 40 mg daily. -Continue atorvastatin  Tobacco abuse: Patient's mother reports that he is a chain smoker. -Nicotine patch offered    DVT prophylaxis: Lovenox Code Status: Full Family Communication: Discussed plan of care with  mother over the phone  Disposition Plan: Likely discharge home in 2 to 3 days. Consults called: Cardiology Admission status: Inpatient  Clydie Braun MD [facility] Pager [phone]   If 7PM-7AM, please contact night-coverage [email or web address] Password [email or web address]  01/22/2020, 1:14 PM

## 2020-01-22 NOTE — ED Notes (Signed)
Pt provided coffee  

## 2020-01-22 NOTE — ED Notes (Signed)
MD Rathore contacted for diet order

## 2020-01-22 NOTE — Progress Notes (Signed)
   01/22/20 1447  Assess: MEWS Score  Temp 97.8 F (36.6 C)  BP 100/79  Pulse Rate (!) 114  ECG Heart Rate (!) 114  Resp 18  Level of Consciousness Alert  SpO2 92 %  O2 Device Nasal Cannula  Patient Activity (if Appropriate) In bed  O2 Flow Rate (L/min) 2 L/min  Assess: MEWS Score  MEWS Temp 0  MEWS Systolic 1  MEWS Pulse 2  MEWS RR 1  MEWS LOC 0  MEWS Score 4  MEWS Score Color Red  Assess: if the MEWS score is Yellow or Red  Were vital signs taken at a resting state? Yes  Focused Assessment Documented focused assessment  Early Detection of Sepsis Score *See Row Information* Low  MEWS guidelines implemented *See Row Information* Yes  Treat  MEWS Interventions Other (Comment) (Patient just arrived to floor; meds administered in ED)  Take Vital Signs  Increase Vital Sign Frequency  Red: Q 1hr X 4 then Q 4hr X 4, if remains red, continue Q 4hrs  Escalate  MEWS: Escalate Red: discuss with charge nurse/RN and provider, consider discussing with RRT  Notify: Charge Nurse/RN  Name of Charge Nurse/RN Notified Eduardo Osier  Date Charge Nurse/RN Notified 01/22/20  Time Charge Nurse/RN Notified 1447  Notify: Provider  Provider Name/Title Dr. Katrinka Blazing  Date Provider Notified 01/22/20  Time Provider Notified 1447  Notification Type Page  Notification Reason Other (Comment) (new arrival to floor, red MEWS)  Response No new orders (Patient was just admitted; new orders were just placed in ED)  Date of Provider Response 01/22/20  Time of Provider Response 1510  Document  Patient Outcome Other (Comment) (just arrived to department; continue to treat and monitor)  Progress note created (see row info) Yes

## 2020-01-22 NOTE — ED Notes (Signed)
Pt eating breakfast tray 

## 2020-01-22 NOTE — ED Notes (Signed)
Pt called for vitals x3. No response 

## 2020-01-22 NOTE — ED Notes (Signed)
Pt called x3. No response 

## 2020-01-22 NOTE — Progress Notes (Signed)
Echocardiogram 2D Echocardiogram has been performed.  Warren Lacy Adyan Palau 01/22/2020, 11:23 AM

## 2020-01-22 NOTE — ED Provider Notes (Signed)
[facility] EMERGENCY DEPARTMENT Provider Note   CSN: [id number] Arrival date & time: 01/21/20  2010   History Chief complaint: Syncope  Jim Dunn is a 54 y.o. male.  The history is provided by the patient and the EMS personnel.  He has history of COPD and CHF and comes in following a syncopal episode.  He states that he had been in a store riding a motorized cart and after being for his purchase, he stood up to leave and had a syncopal episode.  He did feel like blood was rushing to his head.  He denies any chest pain, heaviness, tightness, pressure.  Denies any palpitations.  He did have a similar episode 2 days ago which also occurred with standing up.  He denies prior episodes of syncope.  He does state that he had run out of his medications about 10 days ago and had been unable to reach his physician to get a refill.  He had been seen in the ED to get his prescriptions refilled, but he states that the prescription was not with his discharge paperwork, so he has not had his prescriptions refilled.  EMS reported blood pressure 92/60, heart rate 120, oxygen saturation 90% on room air and he was placed on nasal oxygen.  He has had the COVID-19 vaccine.  Past Medical History:  Diagnosis Date  . CHF (congestive heart failure) (HCC)   . COPD (chronic obstructive pulmonary disease) (HCC)     There are no problems to display for this patient.   No past surgical history on file.     No family history on file.  Social History   Tobacco Use  . Smoking status: Not on file  Substance Use Topics  . Alcohol use: Not on file  . Drug use: Not on file    Home Medications Prior to Admission medications   Medication Sig Start Date End Date Taking? Authorizing Provider  albuterol (VENTOLIN HFA) 108 (90 Base) MCG/ACT inhaler Inhale 1-2 puffs into the lungs every 6 (six) hours as needed for wheezing or shortness of breath. 01/19/20   Linwood Dibbles, MD    lisinopril-hydrochlorothiazide (ZESTORETIC) 20-25 MG tablet Take 1 tablet by mouth daily. 01/19/20   Linwood Dibbles, MD  metoprolol tartrate (LOPRESSOR) 25 MG tablet Take 2 tablets (50 mg total) by mouth 2 (two) times daily. 01/19/20   Linwood Dibbles, MD    Allergies    Patient has no known allergies.  Review of Systems   Review of Systems  All other systems reviewed and are negative.   Physical Exam Updated Vital Signs BP 93/67 (BP Location: Right Arm)   Pulse (!) 116   Temp 97.9 F (36.6 C) (Oral)   Resp 20   SpO2 93%   Physical Exam Vitals and nursing note reviewed.   54 year old male, resting comfortably and in no acute distress. Vital signs are significant for elevated heart rate and low blood pressure. Oxygen saturation is 93%, which is normal. Head is normocephalic and atraumatic. PERRLA, EOMI. Oropharynx is clear. Neck is nontender and supple without adenopathy or JVD. Back is nontender and there is no CVA tenderness.  There is 2+ presacral edema. Lungs have decreased breath sounds at both bases, rales noted at the left base.  There are no wheezes or rhonchi. Chest is nontender. Heart has regular rate and rhythm without murmur. Abdomen is soft, flat, nontender without masses or hepatosplenomegaly and peristalsis is normoactive. Extremities have 1-2+ edema, full range of  motion is present. Skin is warm and dry without rash. Neurologic: Mental status is normal, cranial nerves are intact, there are no motor or sensory deficits.  ED Results / Procedures / Treatments   Labs (all labs ordered are listed, but only abnormal results are displayed) Labs Reviewed  BASIC METABOLIC PANEL - Abnormal; Notable for the following components:      Result Value   Sodium 125 (*)    Chloride 92 (*)    CO2 21 (*)    Glucose, Bld 111 (*)    BUN 35 (*)    Creatinine, Ser 1.35 (*)    Calcium 8.5 (*)    All other components within normal limits  CBC - Abnormal; Notable for the following  components:   Hemoglobin 8.5 (*)    HCT 27.2 (*)    MCV 63.3 (*)    MCH 19.8 (*)    RDW 24.2 (*)    nRBC 0.4 (*)    All other components within normal limits  URINALYSIS, ROUTINE W REFLEX MICROSCOPIC - Abnormal; Notable for the following components:   Color, Urine AMBER (*)    APPearance HAZY (*)    Protein, ur 30 (*)    All other components within normal limits  BRAIN NATRIURETIC PEPTIDE - Abnormal; Notable for the following components:   B Natriuretic Peptide 697.9 (*)    All other components within normal limits  CBG MONITORING, ED - Abnormal; Notable for the following components:   Glucose-Capillary 116 (*)    All other components within normal limits  TROPONIN I (HIGH SENSITIVITY) - Abnormal; Notable for the following components:   Troponin I (High Sensitivity) 66 (*)    All other components within normal limits  TROPONIN I (HIGH SENSITIVITY) - Abnormal; Notable for the following components:   Troponin I (High Sensitivity) 56 (*)    All other components within normal limits  SARS CORONAVIRUS 2 BY RT PCR [facility] ORDER, PERFORMED IN [facility] LAB)    EKG EKG Interpretation  Date/Time:  Wednesday Jan 21 2020 20:14:39 EDT Ventricular Rate:  116 PR Interval:  200 QRS Duration: 94 QT Interval:  312 QTC Calculation: 433 R Axis:   125 Text Interpretation: Sinus tachycardia Left atrial enlargement Left posterior fascicular block T wave abnormality, consider lateral ischemia Abnormal ECG No old tracing to compare Confirmed by Dione Booze ([id number]) on 01/21/2020 11:48:12 PM   Radiology DG Chest Port 1 View  Result Date: 01/22/2020 CLINICAL DATA:  Syncope EXAM: PORTABLE CHEST 1 VIEW COMPARISON:  None. FINDINGS: Cardiomegaly with vascular congestion. Moderate diffuse bilateral interstitial and ground-glass opacity, likely pulmonary edema. More confluent airspace disease at the bases. Possible right pleural effusion IMPRESSION: 1. Cardiomegaly with vascular congestion  and diffuse bilateral interstitial and ground-glass opacity, probably reflecting edema 2. There is more confluent airspace disease at the right base, atelectasis versus pneumonia. Probable small right effusion Electronically Signed   By: Jasmine Pang M.D.   On: 01/22/2020 04:00    Procedures Procedures   Medications Ordered in ED Medications  sodium chloride flush (NS) 0.9 % injection 3 mL (has no administration in time range)    ED Course  I have reviewed the triage vital signs and the nursing notes.  Pertinent labs & imaging results that were available during my care of the patient were reviewed by me and considered in my medical decision making (see chart for details).  MDM Rules/Calculators/A&P Syncope which certainly seems orthostatic based on history.  However, he was noted  to be hypotensive by EMS and is still running a low blood pressure.  Is also somewhat tachycardic.  ECG shows left posterior fascicular block and T wave inversions anterolaterally with no prior ECG available for comparison.  Hemoglobin is low at 8.5 which is a slight drop from ED visit 2 days ago.  RBC indices are microcytic suggesting iron deficiency.  Sodium is 125 which is essentially unchanged.  However, creatinine has increased significantly from 0.65 to 1.35.  In care everywhere, he is noted to have had hyponatremia in the past, but prior hemoglobin was 11.4 in January 2019, still with significantly microcytic indices.  Prior creatinine was normal.  No prior ECG reports are in care everywhere.  He will need to be admitted for further evaluation of his syncope, anemia, renal insufficiency.  BNP has come back significantly elevated over baseline.  Troponin is also mildly elevated but probably represents demand ischemia between combination of anemia and worsening heart failure.  Case is discussed with Dr. Marlowe Sax of [facility], who agrees to admit the patient.  Final Clinical Impression(s) / ED  Diagnoses Final diagnoses:  Syncope, unspecified syncope type  Microcytic anemia  Acute kidney injury (nontraumatic) (HCC)  Hyponatremia    Rx / DC Orders ED Discharge Orders    None       Delora Fuel, MD 63/78/58 [phone]

## 2020-01-22 NOTE — ED Notes (Signed)
US at bedside

## 2020-01-22 NOTE — Progress Notes (Signed)
PT Cancellation Note  Patient Details Name: Jim Dunn MRN: [id number] DOB: Aug 26, 1966   Cancelled Treatment:    Reason Eval/Treat Not Completed: Medical issues which prohibited therapy;Fatigue/lethargy limiting ability to participate.  Pt was mews red and then was quite lethargic, unable to keep him awake.  Follow up as time and pt allow.   Ivar Drape 01/22/2020, 5:25 PM   Samul Dada, PT MS Acute Rehab Dept. Number: [facility] [id number] and [facility] [phone]

## 2020-01-23 ENCOUNTER — Encounter (HOSPITAL_COMMUNITY): Payer: Self-pay | Admitting: Internal Medicine

## 2020-01-23 LAB — CBC
HCT: 25.6 % — ABNORMAL LOW (ref 39.0–52.0)
Hemoglobin: 8.1 g/dL — ABNORMAL LOW (ref 13.0–17.0)
MCH: 19.5 pg — ABNORMAL LOW (ref 26.0–34.0)
MCHC: 31.6 g/dL (ref 30.0–36.0)
MCV: 61.7 fL — ABNORMAL LOW (ref 80.0–100.0)
Platelets: 180 10*3/uL (ref 150–400)
RBC: 4.15 MIL/uL — ABNORMAL LOW (ref 4.22–5.81)
RDW: 23.6 % — ABNORMAL HIGH (ref 11.5–15.5)
WBC: 6.7 10*3/uL (ref 4.0–10.5)
nRBC: 0.9 % — ABNORMAL HIGH (ref 0.0–0.2)

## 2020-01-23 LAB — BASIC METABOLIC PANEL
Anion gap: 7 (ref 5–15)
BUN: 32 mg/dL — ABNORMAL HIGH (ref 6–20)
CO2: 25 mmol/L (ref 22–32)
Calcium: 8.1 mg/dL — ABNORMAL LOW (ref 8.9–10.3)
Chloride: 94 mmol/L — ABNORMAL LOW (ref 98–111)
Creatinine, Ser: 0.84 mg/dL (ref 0.61–1.24)
GFR calc Af Amer: >60 mL/min (ref >60)
GFR calc non Af Amer: >60 mL/min (ref >60)
Glucose, Bld: 150 mg/dL — ABNORMAL HIGH (ref 70–99)
Potassium: 4.7 mmol/L (ref 3.5–5.1)
Sodium: 126 mmol/L — ABNORMAL LOW (ref 135–145)

## 2020-01-23 LAB — FERRITIN: Ferritin: 38 ng/mL (ref 24–336)

## 2020-01-23 LAB — IRON AND TIBC
Iron: 14 ug/dL — ABNORMAL LOW (ref 45–182)
Saturation Ratios: 3 % — ABNORMAL LOW (ref 17.9–39.5)
TIBC: 421 ug/dL (ref 250–450)
UIBC: 407 ug/dL

## 2020-01-23 LAB — PROCALCITONIN: Procalcitonin: 0.4 ng/mL

## 2020-01-23 MED ORDER — QUETIAPINE FUMARATE 50 MG PO TABS
300.0000 mg | ORAL_TABLET | Freq: Every day | ORAL | Status: DC
  Administered 2020-01-23 – 2020-01-26 (×4): 300 mg via ORAL
  Filled 2020-01-23 (×3): qty 6

## 2020-01-23 MED ORDER — ALBUTEROL SULFATE (2.5 MG/3ML) 0.083% IN NEBU: 2.5000 mg | INHALATION_SOLUTION | Freq: Four times a day (QID) | RESPIRATORY_TRACT | Status: DC | PRN

## 2020-01-23 MED ORDER — FUROSEMIDE 10 MG/ML IJ SOLN
40.0000 mg | Freq: Two times a day (BID) | INTRAMUSCULAR | Status: DC
  Administered 2020-01-23 – 2020-01-25 (×4): 40 mg via INTRAVENOUS
  Filled 2020-01-23 (×4): qty 4

## 2020-01-23 NOTE — Plan of Care (Signed)

## 2020-01-23 NOTE — TOC Initial Note (Signed)
Transition of Care [facility]) - Initial/Assessment Note    Patient Details  Name: Jim Dunn MRN: [id number] Date of Birth: 1966-01-17  Transition of Care [facility]) CM/SW Contact:    Terrial Rhodes, LCSWA Phone Number: 01/23/2020, 4:50 PM  Clinical Narrative:                  CSW spoke with patient at bedside. Patient declined SNF placement. Patient says he lives with his mother at the moment but will be living by hisself soon in [place]. Patient says he needs a PCP and once he has one will follow up with possible SNF placement with them near his new home. Patient request a Restaurant manager, fast food. Patient states he has not been compliant with medication. Patient says he will follow up with new PCP.   TOC team will continue to follow for patient needs.   Expected Discharge Plan: Home/Self Care Barriers to Discharge: Continued Medical Work up   Patient Goals and CMS Choice Patient states their goals for this hospitalization and ongoing recovery are:: to return home with mother ellen   Choice offered to / list presented to : NA  Expected Discharge Plan and Services Expected Discharge Plan: Home/Self Care In-house Referral: Clinical Social Work   Post Acute Care Choice: NA Living arrangements for the past 2 months: Single Family Home                 DME Arranged: [facility] [employer] rolling(Weekend to follow)         HH Arranged: NA          Prior Living Arrangements/Services Living arrangements for the past 2 months: Single Family Home Lives with:: Self, Parents Patient language and need for interpreter reviewed:: Yes Do you feel safe going back to the place where you live?: Yes      Need for Family Participation in Patient Care: Yes (Comment) Care giver support system in place?: Yes (comment)   Criminal Activity/Legal Involvement Pertinent to Current Situation/Hospitalization: No - Comment as needed  Activities of Daily Living Home Assistive Devices/Equipment: None ADL  Screening (condition at time of admission) Patient's cognitive ability adequate to safely complete daily activities?: No Is the patient deaf or have difficulty hearing?: No Does the patient have difficulty seeing, even when wearing glasses/contacts?: No Does the patient have difficulty concentrating, remembering, or making decisions?: Yes Patient able to express need for assistance with ADLs?: No Does the patient have difficulty dressing or bathing?: No Independently performs ADLs?: Yes (appropriate for developmental age) Does the patient have difficulty walking or climbing stairs?: Yes Weakness of Legs: Both Weakness of Arms/Hands: None  Permission Sought/Granted Permission sought to share information with : Case Manager, Magazine features editor, Family Supports Permission granted to share information with : Yes, Verbal Permission Granted  Share Information with NAME: [name]     Permission granted to share info w Relationship: Mother  Permission granted to share info w Contact Information: [name] [phone]  Emotional Assessment Appearance:: Appears stated age Attitude/Demeanor/Rapport: Gracious Affect (typically observed): Calm Orientation: : Oriented to Self, Oriented to Place, Oriented to  Time, Oriented to Situation Alcohol / Substance Use: Not Applicable Psych Involvement: No (comment)  Admission diagnosis:  Hyponatremia [E87.1] Syncope [R55] Microcytic anemia [D50.9] Elevated troponin [R77.8] Acute kidney injury (nontraumatic) (HCC) [N17.9] AKI (acute kidney injury) (HCC) [N17.9] Syncope, unspecified syncope type [R55] Patient Active Problem List   Diagnosis Date Noted  . Syncope 01/22/2020  . Acute on chronic systolic CHF (congestive heart  failure) ([place]) 01/22/2020  . AKI (acute kidney injury) ([place]) 01/22/2020  . Hyperlipidemia 01/22/2020  . COPD with acute exacerbation ([place]) 01/22/2020  . Microcytic anemia 01/22/2020  . Chronic hyponatremia 01/22/2020  .  Seizure disorder ([place]) 01/22/2020   PCP:  System, Pcp Not In Pharmacy:   [facility], [address] [place] [address] Phone: [phone] Fax: [phone]  [facility], [address] [place] [address] Phone: [phone] Fax: [phone]     Social Determinants of Health (SDOH) Interventions    Readmission Risk Interventions Readmission Risk Prevention Plan 01/23/2020  Transportation Screening Complete  HRI or [place] Complete  Social Work Consult for [place] Planning/Counseling Complete  Palliative Care Screening Not Applicable  Medication Review Press photographer) (No Data)

## 2020-01-23 NOTE — Progress Notes (Signed)
PROGRESS NOTE  Jim Dunn LOV:[id number] DOB: 01-08-1966 DOA: 01/21/2020 PCP: Jim Dunn, Pcp Not In  Brief History   Jim Dunn is a 54 y.o. male with medical history significant of CHF, COPD, hyponatremia, schizophrenia, and seizure disorder presents after having a syncopal episode.  History is difficult to obtain from the patient.  Apparently, after leaving [employer] he was walking back to the car and felt like blood rushing to his head and had a near syncopal episode.  He reports that he never lost consciousness.  He had a similar episode like this occurred 2 days ago with standing.  Patient is originally from [facility] and that is where he receives all of his care.  He was here visiting his mom and notes that he ran out of all of his medication approximately 10 days ago and was unable to get his physician to refill them.  He tried to get medications refilled by his primary  Patient reports having associated symptoms of productive cough with yellowish sputum production, shortness of breath, wheezing, and lower extremity swelling.  He has a history of congestive heart failure and remembers being told at one point in time that his heart function was around 25%.  Patient's mother able to give additional history over the phone.  He was lying on the ground when she was able to find him and she suspects that he had passed out.  He  had been living with his mother since February of this year after becoming homeless.  Since that time she has been trying to track down his primary care provider to get refills of his medications, but has been unable to do so.  Reportedly the provider is under investigation and has not been able to be reached.  She reports that the patient smokes a pack or so of cigarettes per day on average.  His mother notes that he is more than she can take care of at home and needs placement.   ED Course: Upon admission into the emergency department patient was noted to be  afebrile, heart rate is 101-116, and all other vital signs maintained.  Labs significant for WBC 10.5, hemoglobin 8.5, sodium 125, BUN 35, creatinine 1.35, BNP 697.9, and troponin 66.  Chest x-ray showed cardiomegaly with pulmonary vascular congestion, signs of edema, and a small right pleural effusion.  TRH called to admit.  The patient has been admitted to a telemetry bed. Cardiology has been consulted. Echocardiogram has been performed and demonstrated EF of 25-30% with severely decreased LV function. The LV demonstrates global hypokinesis with mild to moderate dilatation of the left ventricular internal cavity. Diastolic parameters are indeterminate. There is a D shaped left ventricle in diastole suggesting RV overload. The RV systolic function is moderately reduced and RV size is moderately enlarged. The PASP is mildly elevated with RVSP estimated to be 41.4 mmHg. LA and RA are bother severely dilated. 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   Consultants  . Cardiology  Procedures  . None  Antibiotics   Anti-infectives (From admission, onward)   Start     Dose/Rate Route Frequency Ordered Stop   01/22/20 1400  azithromycin (ZITHROMAX) tablet 500 mg     500 mg Oral Daily 01/22/20 1337       Subjective  The patient is resting comfortably. He denies any new complaints.   Objective   Vitals:  Vitals:   01/23/20 1404 01/23/20 1600  BP:  107/70  Pulse:    Resp:  Temp: 98.1 F (36.7 C)   SpO2:     Exam:  Constitutional:  . The patient is awake, alert, and oriented x 3. No acute distress. Respiratory:  . No increased work of breathing. [name] Positive for scattered rales, and rhonchi. No wheezes. . No tactile fremitus Cardiovascular:  . Regular rate and rhythm . No ectopy or gallups . There is a 2/6 systolic murmur . No lateral PMI. No thrills. Abdomen:  . Abdomen is soft, non-tender, non-distended . No hernias, masses, or organomegaly . Normoactive bowel sounds.   Musculoskeletal:  . No cyanosis or clubbing . 2-3+ pitting edema of lower extremities bilaterally. Skin:  . No rashes, lesions, ulcers . palpation of skin: no induration or nodules Neurologic:  . CN 2-12 intact . Sensation all 4 extremities intact Psychiatric:  . Mental status o Mood, affect appropriate o Orientation to person, place, time  . judgment and insight appear intact  I have personally reviewed the following:   Today's Data  . Vitals, BMP, CBC, Iron studies  Cardiology Data  . Echocardiogram  Scheduled Meds: . arformoterol  15 mcg Nebulization BID  . aspirin EC  81 mg Oral Daily  . atorvastatin  40 mg Oral Daily  . azithromycin  500 mg Oral Daily  . budesonide (PULMICORT) nebulizer solution  0.5 mg Nebulization BID  . busPIRone  10 mg Oral TID  . carvedilol  3.125 mg Oral BID WC  . enoxaparin (LOVENOX) injection  40 mg Subcutaneous Q24H  . furosemide  40 mg Intravenous BID  . gabapentin  300 mg Oral TID  . guaiFENesin  600 mg Oral BID  . nicotine  21 mg Transdermal Daily  . phenytoin  100 mg Oral TID  . QUEtiapine  300 mg Oral Daily  . sodium chloride flush  3 mL Intravenous Once  . sodium chloride flush  3 mL Intravenous Q12H   Continuous Infusions: . sodium chloride      Principal Problem:   Syncope Active Problems:   Acute on chronic systolic CHF (congestive heart failure) (HCC)   AKI (acute kidney injury) (HCC)   Hyperlipidemia   COPD with acute exacerbation (HCC)   Microcytic anemia   Chronic hyponatremia   Seizure disorder (HCC)   LOS: 1 day   A & P  Syncope/near syncope: Acute.  Patient reportedly got up and was walking to the car when he almost passed out.  From suspect secondary to orthostatic hypotension. I appreciate cardiology's assistance.   Systolic congestive heart failure exacerbation: Acute. Reports being previously told EF was around 25%.  Noted to have been off his medications for at least the last 10 days.  Noted to have  lower extremity swelling as well as JVD on physical exam. Chest x-ray concerning for edema and possible pneumonia. Will check procalcitonin. Per cardiology recommendations Lasix has been increased to 40 mg bid. Beta blocker and ACE I have been held due to AKI and hypotension. I appreciate cardiology's assistance. Lower extremities will be kept elevated.  Suspected COPD exacerbation: Patient with significant history of tobacco abuse.  Noted to have O2 saturations around 90% on room air.  Patient noted not to be moving much air on physical exam. IV steroids. Continue beta agonist 4 times daily. Continue brovana and budesonide nebs. He has been started on azithromycin.   Iron deficiency anemia: Acute.  Records note hemoglobin noted to be 8.5 on admission and 8.8 on 5/17. Will ordere 510 mg of Feraheme x 1. Monitor hemoglobin and  transfuse for hemoglobin of less than 8.  Essential hypertension: Patient is actually low normotensive without beta blocker, HCZT, hydralazine, and ACE I. It seems that the patient has been non-compliant with these medications for an unknown period of time.   Acute kidney injury: Patient's baseline creatinine previously noted to be 0.65, he presents with creatinine elevated up to 1.35 and BUN 35.  Urinalysis is relatively unremarkable. Renal ultrasound has been performed and demonstrates slightly large, but otherwise normal appearing kidneys without lesions or hydronephrosis. As the patient is not abnormally large, the large size of his kidneys may demonstrate acute renal injury.   Hyponatremia: Acute on chronic.  Sodium 125 on admission, but chronically reported to be low.  126 this morning. Pt is on a 1 liter fluid restriction per cardiology. Monitor.   Paranoid schizophrenia: Home medications include Invega 1.5 mL injected into skin every 30 days, Seroquel 300 mg daily, Xanax 0.5 mg twice daily as needed for anxiety, and buspirone 10 mg 3 times daily. Continue Seroquel,  Xanax, buspirone.  Seizure disorder: Patient has a history of seizure disorder.  Reports being on phenytoin 100 mg 3 times daily for treatment.  Denies having any recent seizures. Continue Phenytoin. Monitor levels.  Hyperlipidemia: Previous home medications include Lipitor 40 mg daily. Continue.  Tobacco abuse: Patient's mother reports that he is a chain smoker. Nicotine patch offered.  I have seen and examined this patient myself. I have spent 42 minutes in the evaluation and care of this patient.  DVT prophylaxis: Lovenox Code Status: Full Family Communication: Discussed plan of care with  mother over the phone  Disposition Plan: Likely discharge home in 2 to 3 days.   Ava Swayze, DO [facility] Direct contact: see [email or web address]  7PM-7AM contact night coverage as above 01/23/2020, 5:17 PM  LOS: 1 day

## 2020-01-23 NOTE — Progress Notes (Signed)
Heart Failure Stewardship Pharmacist Progress Note   PCP: System, Pcp Not In PCP-Cardiologist: No primary care provider on file.    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 He reported he ran out of all his medications 10 days PTA and was unable to get refills from physician. He reported symptoms of shortness of breath, LE swelling, PND, and orthopnea.   Current HF Medications: Furosemide 40 mg IV BID Carvedilol 3.125 mg BID  Prior to admission HF Medications: Furosemide 40 mg BID - not taking KCl 10 mEq daily - not taking Carvedilol 25 mg BID - not taking Lisinopril-HCTZ 20/25 mg daily - not taking Hydralazine 25 mg TID + Isordil 20 mg BID  - not taking   Pertinent Lab Values: . Serum creatinine 0.84, BUN 32, Potassium 4.7, Sodium 126, BNP 697.9  Vital Signs: . Weight: 221 lbs (estimated dry weight: unknown) . Blood pressure: 90-100/80s  . Heart rate: 90s   Medication Assistance / Insurance Benefits Check: Does the patient have prescription insurance?   yes Type of insurance plan: [employer]  Does the patient qualify for medication assistance through manufacturers or grants?   Pending household income information  Eligible grants and/or patient assistance programs: pending  Medication assistance applications in progress: none  Medication assistance applications approved: none  Approved medication assistance renewals will be completed by: TBD  Outpatient Pharmacy:  Prior to admission outpatient pharmacy: [facility] Is the patient willing to use [facility] TOC pharmacy at discharge?   TBD Is the patient willing to transition their outpatient pharmacy to utilize a [facility] outpatient pharmacy?   TBD    Assessment: 1. Acute on chronic systolic CHF (EF 77-82%), due to nonischemic dilated cardiomyopathy.  NYHA class II/III symptoms. 3+ edema noted on MD exam. - Agree with increasing furosemide to 40 mg IV BID - Continue carvedilol 3.125 mg BID - Consider adding Entresto 24/26 mg BID once BP improves - Caution adding spironolactone with elevated baseline potassium today - Consider adding Farxiga at discharge   Plan: 1) Medication changes recommended at this time: - None - BP soft with current regimen  2) Patient assistance application(s): - None pending - Both Entresto and [place] are $0 copay with patient's insurance  3)  Education  - To be completed prior to discharge  Danae Orleans, PharmD, BCPS Heart Failure Stewardship Pharmacist Phone [phone] 01/23/2020       8:58 AM

## 2020-01-23 NOTE — Progress Notes (Signed)
Subjective:  Denies any chest pain.  States breathing has improved.  Still marked leg swelling.  Objective:  Vital Signs in the last 24 hours: Temp:  [97.4 F (36.3 C)-98.2 F (36.8 C)] 97.6 F (36.4 C) (05/21 0900) Pulse Rate:  [33-114] 94 (05/21 0900) Resp:  [17-25] 19 (05/21 0536) BP: (83-109)/(68-84) 109/80 (05/21 0846) SpO2:  [65 %-100 %] 90 % (05/21 0900) Weight:  [91.1 kg-100.5 kg] 100.5 kg (05/21 0512)  Intake/Output from previous day: 05/20 0701 - 05/21 0700 In: 363 [P.O.:360; I.V.:3] Out: 350 [Urine:350] Intake/Output from this shift: No intake/output data recorded.  Physical Exam: Neck: no adenopathy, no carotid bruit, no JVD, supple, symmetrical, trachea midline and thyroid not enlarged, symmetric, no tenderness/mass/nodules Lungs: rales decreased breath sounds at bases with bilateral rales noted and occasional rhonchi Heart: regular rate and rhythm, S1, S2 normal and 2/6 systolic murmur and S3 gallop noted Extremities: no clubbing, cyanosis 3+ edema with chronic ulcer noted  Lab Results: Recent Labs    01/21/20 2026 01/23/20 0350  WBC 10.5 6.7  HGB 8.5* 8.1*  PLT 201 180   Recent Labs    01/21/20 2026 01/23/20 0350  NA 125* 126*  K 4.8 4.7  CL 92* 94*  CO2 21* 25  GLUCOSE 111* 150*  BUN 35* 32*  CREATININE 1.35* 0.84   No results for input(s): TROPONINI in the last 72 hours.  Invalid input(s): CK, MB Hepatic Function Panel No results for input(s): PROT, ALBUMIN, AST, ALT, ALKPHOS, BILITOT, BILIDIR, IBILI in the last 72 hours. No results for input(s): CHOL in the last 72 hours. No results for input(s): PROTIME in the last 72 hours.  Imaging: Imaging results have been reviewed and US RENAL  Result Date: 01/22/2020 CLINICAL DATA:  Acute kidney injury.  Photo nerve Rea. EXAM: RENAL / URINARY TRACT ULTRASOUND COMPLETE COMPARISON:  None. FINDINGS: Right Kidney: Renal measurements: 12.0 x 4.4 x 6.9 cm = volume: 191 mL . Echogenicity within normal  limits. No mass or hydronephrosis visualized. Left Kidney: Renal measurements: 11.4 x 6.8 x 5.9 cm = volume: 239 mL. Echogenicity within normal limits. No mass or hydronephrosis visualized. Bladder: Appears normal for degree of bladder distention. Other: Ascites and bilateral pleural effusions are noted. IMPRESSION: Slightly large but otherwise normal appearing kidneys. No focal lesion or hydronephrosis. Size could be within normal limits if the patient is a large person. Otherwise, there can be renal enlargement with either diabetic glomerular nephropathy or acute kidney injury. Ascites and pleural effusions. Electronically Signed   By: Paulina Fusi M.D.   On: 01/22/2020 10:29   DG Chest Port 1 View  Result Date: 01/22/2020 CLINICAL DATA:  Syncope EXAM: PORTABLE CHEST 1 VIEW COMPARISON:  None. FINDINGS: Cardiomegaly with vascular congestion. Moderate diffuse bilateral interstitial and ground-glass opacity, likely pulmonary edema. More confluent airspace disease at the bases. Possible right pleural effusion IMPRESSION: 1. Cardiomegaly with vascular congestion and diffuse bilateral interstitial and ground-glass opacity, probably reflecting edema 2. There is more confluent airspace disease at the right base, atelectasis versus pneumonia. Probable small right effusion Electronically Signed   By: Jasmine Pang M.D.   On: 01/22/2020 04:00   ECHOCARDIOGRAM COMPLETE  Result Date: 01/22/2020    ECHOCARDIOGRAM REPORT   Patient Name:   Jim Dunn Date of Exam: 01/22/2020 Medical Rec #:  [id number]   Height:       68.0 in Accession #:    [id number]  Weight:       208.0 lb Date of Birth:  12/28/1965    BSA:          2.078 m Patient Age:    53 years    BP:           119/73 mmHg Patient Gender: M           HR:           112 bpm. Exam Location:  Inpatient Procedure: 2D Echo, Color Doppler and Cardiac Doppler Indications:    I50.31 Acute diastolic (congestive) heart failure  History:        Patient has no prior history of  Echocardiogram examinations.                 CHF; COPD.  Sonographer:    Irving Burton Senior RDCS Referring Phys: [phone] RONDELL A SMITH  Sonographer Comments: Poor apical windows. IMPRESSIONS  1. Left ventricular ejection fraction, by estimation, is 25 to 30%. The left ventricle has severely decreased function. The left ventricle demonstrates global hypokinesis. The left ventricular internal cavity size was mildly to moderately dilated. Left ventricular diastolic parameters are indeterminate. There is the interventricular septum is flattened in diastole ('D' shaped left ventricle), consistent with right ventricular volume overload.  2. Right ventricular systolic function is moderately reduced. The right ventricular size is moderately enlarged. There is mildly elevated pulmonary artery systolic pressure. The estimated right ventricular systolic pressure is 41.4 mmHg.  3. Left atrial size was severely dilated.  4. Right atrial size was severely dilated.  5. The mitral valve is normal in structure. Moderate mitral valve regurgitation. No evidence of mitral stenosis.  6. Tricuspid valve regurgitation is severe.  7. The aortic valve is normal in structure. Aortic valve regurgitation is not visualized. No aortic stenosis is present.  8. The inferior vena cava is dilated in size with <50% respiratory variability, suggesting right atrial pressure of 15 mmHg. FINDINGS  Left Ventricle: Left ventricular ejection fraction, by estimation, is 25 to 30%. The left ventricle has severely decreased function. The left ventricle demonstrates global hypokinesis. The left ventricular internal cavity size was mildly to moderately dilated. There is no left ventricular hypertrophy. The interventricular septum is flattened in diastole ('D' shaped left ventricle), consistent with right ventricular volume overload. Left ventricular diastolic parameters are indeterminate. Right Ventricle: The right ventricular size is moderately enlarged. No  increase in right ventricular wall thickness. Right ventricular systolic function is moderately reduced. There is mildly elevated pulmonary artery systolic pressure. The tricuspid regurgitant velocity is 2.57 m/s, and with an assumed right atrial pressure of 15 mmHg, the estimated right ventricular systolic pressure is 41.4 mmHg. Left Atrium: Left atrial size was severely dilated. Right Atrium: Right atrial size was severely dilated. Pericardium: A small pericardial effusion is present. Mitral Valve: The mitral valve is normal in structure. Normal mobility of the mitral valve leaflets. Moderate mitral valve regurgitation. No evidence of mitral valve stenosis. Tricuspid Valve: The tricuspid valve is normal in structure. Tricuspid valve regurgitation is severe. No evidence of tricuspid stenosis. Aortic Valve: The aortic valve is normal in structure. Aortic valve regurgitation is not visualized. No aortic stenosis is present. Pulmonic Valve: The pulmonic valve was normal in structure. Pulmonic valve regurgitation is mild. No evidence of pulmonic stenosis. Aorta: The aortic root and ascending aorta are structurally normal, with no evidence of dilitation. Venous: The inferior vena cava is dilated in size with less than 50% respiratory variability, suggesting right atrial pressure of 15 mmHg. IAS/Shunts: The interatrial septum was not well visualized.  LEFT VENTRICLE  PLAX 2D LVIDd:         6.25 cm      Diastology LVIDs:         5.60 cm      LV e' lateral:   15.30 cm/s LV PW:         1.45 cm      LV E/e' lateral: 10.8 LV IVS:        0.95 cm      LV e' medial:    7.07 cm/s LVOT diam:     2.00 cm      LV E/e' medial:  23.5 LV SV:         30 LV SV Index:   14 LVOT Area:     3.14 cm  LV Volumes (MOD) LV vol d, MOD A2C: 218.0 ml LV vol d, MOD A4C: 208.0 ml LV vol s, MOD A2C: 146.0 ml LV vol s, MOD A4C: 149.0 ml LV SV MOD A2C:     72.0 ml LV SV MOD A4C:     208.0 ml LV SV MOD BP:      64.0 ml RIGHT VENTRICLE RV S prime:      5.77 cm/s TAPSE (M-mode): 1.1 cm LEFT ATRIUM              Index       RIGHT ATRIUM           Index LA diam:        5.70 cm  2.74 cm/m  RA Area:     38.90 cm LA Vol (A2C):   172.0 ml 82.76 ml/m RA Volume:   160.00 ml 76.99 ml/m LA Vol (A4C):   145.0 ml 69.77 ml/m LA Biplane Vol: 161.0 ml 77.47 ml/m  AORTIC VALVE LVOT Vmax:   69.50 cm/s LVOT Vmean:  54.800 cm/s LVOT VTI:    0.094 m  AORTA Ao Root diam: 2.80 cm Ao Asc diam:  3.20 cm MITRAL VALVE                TRICUSPID VALVE MV Area (PHT): 6.27 cm     TR Peak grad:   26.4 mmHg MV Decel Time: 121 msec     TR Vmax:        257.00 cm/s MV E velocity: 166.00 cm/s MV A velocity: 36.00 cm/s   SHUNTS MV E/A ratio:  4.61         Systemic VTI:  0.09 m                             Systemic Diam: 2.00 cm Cherlynn Kaiser MD Electronically signed by Cherlynn Kaiser MD Signature Date/Time: 01/22/2020/7:36:44 PM    Final     Cardiac Studies:  Assessment/Plan:  Acute decompensated systolic congestive heart failure. Minimally elevated high sensitivity troponin I is secondary to above Nonischemic dilated cardiomyopathy. Hypertensive heart disease with systolic dysfunction. Bronchitis, rule out pneumonia History of EtOH abuse. Tobacco abuse. COPD Hyperlipidemia Hypochromic microcytic anemia. Hyponatremia Paranoid  schizophrenia History of seizure disorder. Status post fall. Status post Acute renal injury probably a component of cardiorenal syndrome Plan Increase Lasix to 40 mg IV twice daily. Hold ENTRESTO for now Daily weights. Restrict fluid to 1 L per 24 hours Strict I&O's  LOS: 1 day    Charolette Forward 01/23/2020, 11:02 AM

## 2020-01-23 NOTE — Care Management (Signed)
Per Chrystal M. W/[facility] @ [phone]   Co-pay amount for Xeralto 24mg /26mg  bid for  30 day supply, zero co-pay. Co-pay amount for Farxiga 10 mg daily for 30 day supply, zero co-pay.  No PA required Tier 3 and 4 Retail Pharmacy : [facility]  Pt. has : LIS

## 2020-01-23 NOTE — Evaluation (Signed)
Physical Therapy Evaluation Patient Details Name: Jim Dunn MRN: [id number] DOB: [birth year] Today's Date: 01/23/2020   History of Present Illness  54 y.o. male with medical history significant of CHF, COPD, hyponatremia, schizophrenia, and seizure disorder presents after having a syncopal episode. In ED x-ray showed cardiomegaly with pulmonary vascular congestion, signs of edema, and a small right pleural effusion. Admitted 01/22/20 for treatment of systolic congestive heart failure exacerbation and suspected COPD exacerbation   Clinical Impression  PTA pt reports living with his mother (who is frail)  in 2 story home with level entry and bed and bath upstairs. Pt reports he usually sleeps on the couch downstairs. Pt reports independence with mobility and iADLs, however also reports multiple bouts of falling over the last month. Pt is limited in safe mobility by decreased safety awareness and deficits, poor use of DME in the presence of decreased strength, balance and endurance. Pt requires min A for transfers. Pt began ambulation with light minA as ambulation progressed passed 40 feet pt requiring increasing support. Pt does not take any cues for resting or better use of RW to conserve energy. By the end of 80 feet ambulation pt requiring modA for support back to recliner. BP and SaO2 stable throughout ambulation. PT recommending SNF level rehab at discharge, however pt will likely decline. Pt mother likely can not provide support pt needs. Pt will at least need RW at discharge. PT will continue to follow acutely.      Follow Up Recommendations SNF    Equipment Recommendations  Rolling walker with 5" wheels       Precautions / Restrictions Precautions Precautions: Fall Precaution Comments: multiple falls possible syncope Restrictions Weight Bearing Restrictions: No      Mobility  Bed Mobility               General bed mobility comments: sitting up in recliner  Transfers Overall  transfer level: Needs assistance   Transfers: Sit to/from Stand Sit to Stand: Min guard         General transfer comment: min guard for safety, increased effort to steady  Ambulation/Gait Ambulation/Gait assistance: Min assist;Mod assist Gait Distance (Feet): 80 Feet Assistive device: Rolling walker (2 wheeled) Gait Pattern/deviations: Step-through pattern;Decreased step length - right;Decreased step length - left;Trunk flexed;Shuffle Gait velocity: slowed Gait velocity interpretation: <1.8 ft/sec, indicate of risk for recurrent falls General Gait Details: min A progressing to modA for support with ambulation with RW, pt overestimates his stability and requires maximal multimodal cuing for upright posture and proximity to RW        Balance Overall balance assessment: Needs assistance Sitting-balance support: Feet supported;No upper extremity supported Sitting balance-Leahy Scale: Fair     Standing balance support: Bilateral upper extremity supported Standing balance-Leahy Scale: Poor Standing balance comment: requires at least single UE support                              Pertinent Vitals/Pain Pain Assessment: No/denies pain    Home Living Family/patient expects to be discharged to:: Private residence Living Arrangements: Parent Available Help at Discharge: Family;Available 24 hours/day;Other (Comment)(mother health not good) Type of Home: House Home Access: Level entry     Home Layout: Two level;Bed/bath upstairs;1/2 bath on main level;Other (Comment)(usually sleeps on couch downstairs) Home Equipment: None      Prior Function Level of Independence: Independent         Comments: independent with household ambulation uses  scooter at grocery store         Extremity/Trunk Assessment   Upper Extremity Assessment Upper Extremity Assessment: Generalized weakness    Lower Extremity Assessment Lower Extremity Assessment: RLE deficits/detail;LLE  deficits/detail RLE Deficits / Details: LE edema, strength grossly 3+/4 RLE Sensation: decreased light touch RLE Coordination: decreased fine motor LLE Deficits / Details: LE edema, strength grossly 3+/4 LLE Sensation: decreased light touch LLE Coordination: decreased fine motor       Communication   Communication: No difficulties  Cognition Arousal/Alertness: Awake/alert Behavior During Therapy: WFL for tasks assessed/performed Overall Cognitive Status: No family/caregiver present to determine baseline cognitive functioning Area of Impairment: Safety/judgement;Awareness;Problem solving;Following commands                       Following Commands: Follows one step commands inconsistently;Follows multi-step commands with increased time;Follows one step commands with increased time;Follows multi-step commands inconsistently Safety/Judgement: Decreased awareness of safety;Decreased awareness of deficits Awareness: Emergent Problem Solving: Slow processing;Decreased initiation;Difficulty sequencing;Requires verbal cues;Requires tactile cues General Comments: pt with increased difficulty with sequencing and command follow, as well as safety with RW, over estimates how far he can safely ambulate      General Comments General comments (skin integrity, edema, etc.): Pt able to ambulate on RA with SaO2 >90%O2, BP in sitting 107/70, standing 102/76, after ambulation 97/76 and after 3 minutes of seated 109/80, HR in md 120s with ambulation         Assessment/Plan    PT Assessment Patient needs continued PT services  PT Problem List Decreased strength;Decreased activity tolerance;Decreased balance;Decreased mobility;Decreased coordination;Decreased cognition;Decreased knowledge of use of DME;Decreased safety awareness;Cardiopulmonary status limiting activity       PT Treatment Interventions DME instruction;Gait training;Functional mobility training;Therapeutic activities;Therapeutic  exercise;Balance training;Cognitive remediation;Patient/family education    PT Goals (Current goals can be found in the Care Plan section)  Acute Rehab PT Goals Patient Stated Goal: get a new place to stay PT Goal Formulation: With patient Time For Goal Achievement: 02/06/20 Potential to Achieve Goals: Fair    Frequency Min 3X/week   Barriers to discharge Decreased caregiver support(mother is frail )         AM-PAC PT "6 Clicks" Mobility  Outcome Measure Help needed turning from your back to your side while in a flat bed without using bedrails?: None Help needed moving from lying on your back to sitting on the side of a flat bed without using bedrails?: A Little Help needed moving to and from a bed to a chair (including a wheelchair)?: A Little Help needed standing up from a chair using your arms (e.g., wheelchair or bedside chair)?: A Little Help needed to walk in hospital room?: A Little Help needed climbing 3-5 steps with a railing? : A Lot 6 Click Score: 18    End of Session Equipment Utilized During Treatment: Gait belt Activity Tolerance: Patient limited by fatigue Patient left: in chair;with call bell/phone within reach;with chair alarm set Nurse Communication: Mobility status PT Visit Diagnosis: Unsteadiness on feet (R26.81);Other abnormalities of gait and mobility (R26.89);Repeated falls (R29.6);Muscle weakness (generalized) (M62.81);History of falling (Z91.81);Difficulty in walking, not elsewhere classified (R26.2)    Time: 2824-1753 PT Time Calculation (min) (ACUTE ONLY): 29 min   Charges:   PT Evaluation $PT Eval Moderate Complexity: 1 Mod PT Treatments $Gait Training: 8-22 mins        Jim Dunn PT, DPT Acute Rehabilitation Services Pager [phone] Office [phone]   Courtney Paris  [employer] 01/23/2020, 4:27 PM

## 2020-01-23 NOTE — Care Management (Addendum)
1039 01-23-20 Benefits check submitted for Entresto. Benefits check also submitted for Farxiga. Case Manager will follow for cost. Graves-Bigelow, Lamar Laundry, RN, BSN Case Manager

## 2020-01-24 LAB — BASIC METABOLIC PANEL
Anion gap: 9 (ref 5–15)
BUN: 36 mg/dL — ABNORMAL HIGH (ref 6–20)
CO2: 23 mmol/L (ref 22–32)
Calcium: 8.2 mg/dL — ABNORMAL LOW (ref 8.9–10.3)
Chloride: 93 mmol/L — ABNORMAL LOW (ref 98–111)
Creatinine, Ser: 0.92 mg/dL (ref 0.61–1.24)
GFR calc Af Amer: >60 mL/min (ref >60)
GFR calc non Af Amer: >60 mL/min (ref >60)
Glucose, Bld: 91 mg/dL (ref 70–99)
Potassium: 4.6 mmol/L (ref 3.5–5.1)
Sodium: 125 mmol/L — ABNORMAL LOW (ref 135–145)

## 2020-01-24 LAB — PROCALCITONIN: Procalcitonin: 0.36 ng/mL

## 2020-01-24 MED ORDER — ALPRAZOLAM 0.5 MG PO TABS
0.5000 mg | ORAL_TABLET | Freq: Two times a day (BID) | ORAL | Status: DC | PRN
  Administered 2020-01-24 – 2020-01-27 (×6): 0.5 mg via ORAL
  Filled 2020-01-24 (×6): qty 1

## 2020-01-24 MED ORDER — SACUBITRIL-VALSARTAN 24-26 MG PO TABS
1.0000 | ORAL_TABLET | Freq: Two times a day (BID) | ORAL | Status: DC
  Administered 2020-01-24 – 2020-01-27 (×7): 1 via ORAL
  Filled 2020-01-24 (×7): qty 1

## 2020-01-24 MED ORDER — DIGOXIN 125 MCG PO TABS
0.1250 mg | ORAL_TABLET | Freq: Every day | ORAL | Status: DC
  Administered 2020-01-24 – 2020-01-27 (×4): 0.125 mg via ORAL
  Filled 2020-01-24 (×4): qty 1

## 2020-01-24 MED ORDER — ZOLPIDEM TARTRATE 5 MG PO TABS
5.0000 mg | ORAL_TABLET | Freq: Once | ORAL | Status: AC
  Administered 2020-01-24: 5 mg via ORAL
  Filled 2020-01-24: qty 1

## 2020-01-24 NOTE — Progress Notes (Signed)
Subjective:  Patient denies any chest pain.  States breathing is slowly improving leg swelling persist  Objective:  Vital Signs in the last 24 hours: Temp:  [97.6 F (36.4 C)-98.3 F (36.8 C)] 97.6 F (36.4 C) (05/22 0441) Pulse Rate:  [91-98] 94 (05/22 0441) Resp:  [18-20] 18 (05/22 0441) BP: (87-110)/(69-86) 110/79 (05/22 1028) SpO2:  [91 %-100 %] 97 % (05/22 0908) Weight:  [101.4 kg] 101.4 kg (05/22 0441)  Intake/Output from previous day: 05/21 0701 - 05/22 0700 In: 480 [P.O.:480] Out: 1700 [Urine:1700] Intake/Output from this shift: Total I/O In: 520 [P.O.:520] Out: -   Physical Exam: Neck: no adenopathy, no carotid bruit, no JVD and supple, symmetrical, trachea midline Lungs: decreased breath sounds at bases with faint rales air entry improved Heart: regular rate and rhythm, S1, S2 normal and 2/6 systolic murmur and S3 gallop noted Abdomen: soft, non-tender; bowel sounds normal; no masses,  no organomegaly Extremities: no clubbing, cyanosis 3+ edema noted  Lab Results: Recent Labs    01/21/20 2026 01/23/20 0350  WBC 10.5 6.7  HGB 8.5* 8.1*  PLT 201 180   Recent Labs    01/23/20 0350 01/24/20 0441  NA 126* 125*  K 4.7 4.6  CL 94* 93*  CO2 25 23  GLUCOSE 150* 91  BUN 32* 36*  CREATININE 0.84 0.92   No results for input(s): TROPONINI in the last 72 hours.  Invalid input(s): CK, MB Hepatic Function Panel No results for input(s): PROT, ALBUMIN, AST, ALT, ALKPHOS, BILITOT, BILIDIR, IBILI in the last 72 hours. No results for input(s): CHOL in the last 72 hours. No results for input(s): PROTIME in the last 72 hours.  Imaging: Imaging results have been reviewed and No results found.  Cardiac Studies:  Assessment/Plan:  Acute decompensated systolic congestive heart failure. Minimally elevated high sensitivity troponin I is secondary to above Nonischemic dilated cardiomyopathy. Hypertensive heart disease with systolic dysfunction. Bronchitis, rule out  pneumonia History of EtOH abuse. Tobacco abuse. COPD Hyperlipidemia Hypochromic microcytic anemia. Hyponatremia Paranoid schizophrenia History of seizure disorder. Status post fall. Status post Acute renal injury probably a component of cardiorenal syndrome Plan Start digoxin low-dose Entresto as per orders  LOS: 2 days    Rinaldo Cloud 01/24/2020, 11:41 AM

## 2020-01-24 NOTE — [place] FL2 (Signed)
[place] MEDICAID FL2 LEVEL OF CARE SCREENING TOOL     IDENTIFICATION  Patient Name: Jim Dunn Birthdate: [birth year] Sex: male Admission Date (Current Location): 01/21/2020  [facility] and [place] Number:  Producer, television/film/video and Address:  [facility], [address] [place], [place] [address]      Provider Number: [id number]  Attending Physician Name and Address:  Fran Lowes, DO  Relative Name and Phone Number:       Current Level of Care: Hospital Recommended Level of Care: Skilled Nursing Facility Prior Approval Number:    Date Approved/Denied:   PASRR Number: pending  Discharge Plan: SNF    Current Diagnoses: Patient Active Problem List   Diagnosis Date Noted  . Syncope 01/22/2020  . Acute on chronic systolic CHF (congestive heart failure) (HCC) 01/22/2020  . AKI (acute kidney injury) (HCC) 01/22/2020  . Hyperlipidemia 01/22/2020  . COPD with acute exacerbation (HCC) 01/22/2020  . Microcytic anemia 01/22/2020  . Chronic hyponatremia 01/22/2020  . Seizure disorder (HCC) 01/22/2020    Orientation RESPIRATION BLADDER Height & Weight     Self, Situation, Place, Time  O2 Continent Weight: 223 lb 8 oz (101.4 kg) Height:  5\' 8"  (172.7 cm)  BEHAVIORAL SYMPTOMS/MOOD NEUROLOGICAL BOWEL NUTRITION STATUS    Convulsions/Seizures Continent    AMBULATORY STATUS COMMUNICATION OF NEEDS Skin   Limited Assist Verbally Normal                       Personal Care Assistance Level of Assistance  Bathing, Dressing Bathing Assistance: Limited assistance   Dressing Assistance: Limited assistance     Functional Limitations Info             SPECIAL CARE FACTORS FREQUENCY  PT (By licensed PT), OT (By licensed OT)                    Contractures Contractures Info: Not present    Additional Factors Info  Code Status Code Status Info: FULL CODE             Current Medications (01/24/2020):  This is the current hospital active  medication list Current Facility-Administered Medications  Medication Dose Route Frequency Provider Last Rate Last Admin  . 0.9 %  sodium chloride infusion  250 mL Intravenous PRN 01/26/2020 A, MD      . acetaminophen (TYLENOL) tablet 650 mg  650 mg Oral Q4H PRN Madelyn Flavors A, MD   650 mg at 01/24/20 0820  . albuterol (PROVENTIL) (2.5 MG/3ML) 0.083% nebulizer solution 2.5 mg  2.5 mg Nebulization Q6H PRN Swayze, Ava, DO      . arformoterol (BROVANA) nebulizer solution 15 mcg  15 mcg Nebulization BID 01/26/20 A, MD   15 mcg at 01/24/20 0905  . aspirin EC tablet 81 mg  81 mg Oral Daily Smith, Rondell A, MD   81 mg at 01/24/20 1030  . atorvastatin (LIPITOR) tablet 40 mg  40 mg Oral Daily Smith, Rondell A, MD   40 mg at 01/24/20 1026  . azithromycin (ZITHROMAX) tablet 500 mg  500 mg Oral Daily 01/26/20, Rondell A, MD   500 mg at 01/24/20 1023  . budesonide (PULMICORT) nebulizer solution 0.5 mg  0.5 mg Nebulization BID 01/26/20 A, MD   0.5 mg at 01/24/20 0905  . busPIRone (BUSPAR) tablet 10 mg  10 mg Oral TID 01/26/20 A, MD   10 mg at 01/24/20 1023  . carvedilol (  COREG) tablet 3.125 mg  3.125 mg Oral BID WC Charolette Forward, MD   3.125 mg at 01/24/20 1026  . enoxaparin (LOVENOX) injection 40 mg  40 mg Subcutaneous Q24H Smith, Rondell A, MD   40 mg at 01/24/20 1024  . furosemide (LASIX) injection 40 mg  40 mg Intravenous BID Charolette Forward, MD   40 mg at 01/24/20 1026  . gabapentin (NEURONTIN) capsule 300 mg  300 mg Oral TID Fuller Plan A, MD   300 mg at 01/24/20 1026  . guaiFENesin (MUCINEX) 12 hr tablet 600 mg  600 mg Oral BID Fuller Plan A, MD   600 mg at 01/24/20 1024  . nicotine (NICODERM CQ - dosed in mg/24 hours) patch 21 mg  21 mg Transdermal Daily Tamala Julian, Rondell A, MD   21 mg at 01/22/20 1355  . ondansetron (ZOFRAN) injection 4 mg  4 mg Intravenous Q6H PRN Fuller Plan A, MD      . phenytoin (DILANTIN) ER capsule 100 mg  100 mg Oral TID Fuller Plan A, MD   100 mg  at 01/24/20 1026  . QUEtiapine (SEROQUEL) tablet 300 mg  300 mg Oral QHS Blenda Nicely, RPH   300 mg at 01/23/20 2129  . sodium chloride flush (NS) 0.9 % injection 3 mL  3 mL Intravenous Once Smith, Rondell A, MD      . sodium chloride flush (NS) 0.9 % injection 3 mL  3 mL Intravenous Q12H Smith, Rondell A, MD   3 mL at 01/23/20 2130  . sodium chloride flush (NS) 0.9 % injection 3 mL  3 mL Intravenous PRN Norval Morton, MD         Discharge Medications: Please see discharge summary for a list of discharge medications.  Relevant Imaging Results:  Relevant Lab Results:   Additional Information SS# [id number]  Amador Cunas, [place]

## 2020-01-24 NOTE — Social Work (Addendum)
SW spoke with pt again re PT recommendation for SNF. Pt now reports agreeable to SNF if his mother agrees. Spoke to pt's mother, [name] [phone], and she is agreeable to SNF for STR and believes pt will need eventual LTC placement. Pt's mother has applied for Medicaid in [facility] on pt's behalf. SW reviewed SNF placement process for STR and LTC and answered questions. Pt will need 30 day note signed for PASRR. Request for SNF auth faxed to [employer]. Will f/u with offers once available.   Dellie Burns, MSW, LCSW [phone] (coverage)

## 2020-01-24 NOTE — Progress Notes (Addendum)
PROGRESS NOTE  Jim Dunn CXK:[id number] DOB: Oct 20, 1965 DOA: 01/21/2020 PCP: System, Pcp Not In  Brief History   Jim Dunn is a 54 y.o. male with medical history significant of CHF, COPD, hyponatremia, schizophrenia, and seizure disorder presents after having a syncopal episode.  History is difficult to obtain from the patient.  Apparently, after leaving [employer] he was walking back to the car and felt like blood rushing to his head and had a near syncopal episode.  He reports that he never lost consciousness.  He had a similar episode like this occurred 2 days ago with standing.  Patient is originally from [facility] and that is where he receives all of his care.  He was here visiting his mom and notes that he ran out of all of his medication approximately 10 days ago and was unable to get his physician to refill them.  He tried to get medications refilled by his primary  Patient reports having associated symptoms of productive cough with yellowish sputum production, shortness of breath, wheezing, and lower extremity swelling.  He has a history of congestive heart failure and remembers being told at one point in time that his heart function was around 25%.  Patient's mother able to give additional history over the phone.  He was lying on the ground when she was able to find him and she suspects that he had passed out.  He  had been living with his mother since February of this year after becoming homeless.  Since that time she has been trying to track down his primary care provider to get refills of his medications, but has been unable to do so.  Reportedly the provider is under investigation and has not been able to be reached.  She reports that the patient smokes a pack or so of cigarettes per day on average.  His mother notes that he is more than she can take care of at home and needs placement.  ED Course: Upon admission into the emergency department patient was noted to be  afebrile, heart rate is 101-116, and all other vital signs maintained.  Labs significant for WBC 10.5, hemoglobin 8.5, sodium 125, BUN 35, creatinine 1.35, BNP 697.9, and troponin 66.  Chest x-ray showed cardiomegaly with pulmonary vascular congestion, signs of edema, and a small right pleural effusion.  TRH called to admit.  The patient has been admitted to a telemetry bed. Cardiology has been consulted. Echocardiogram has been performed and demonstrated EF of 25-30% with severely decreased LV function. The LV demonstrates global hypokinesis with mild to moderate dilatation of the left ventricular internal cavity. Diastolic parameters are indeterminate. There is a D shaped left ventricle in diastole suggesting RV overload. The RV systolic function is moderately reduced and RV size is moderately enlarged. The PASP is mildly elevated with RVSP estimated to be 41.4 mmHg. LA and RA are bother severely dilated. 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   Consultants  . Cardiology  Procedures  . None  Antibiotics   Anti-infectives (From admission, onward)   Start     Dose/Rate Route Frequency Ordered Stop   01/22/20 1400  azithromycin (ZITHROMAX) tablet 500 mg     500 mg Oral Daily 01/22/20 1337       Subjective  The patient is resting comfortably. He denies any new complaints.   Objective   Vitals:  Vitals:   01/24/20 1248 01/24/20 1603  BP: 104/75 103/70  Pulse: 100 95  Resp: 18 18  Temp:  98.5 F (36.9 C) 98.5 F (36.9 C)  SpO2: 95%    Exam:  Constitutional:  . The patient is awake, alert, and oriented x 3. No acute distress. Respiratory:  . No increased work of breathing. [name] Positive for scattered rales, and rhonchi. No wheezes. . No tactile fremitus Cardiovascular:  . Regular rate and rhythm . No ectopy or gallups . There is a 2/6 systolic murmur . No lateral PMI. No thrills. Abdomen:  . Abdomen is soft, non-tender, non-distended . No hernias, masses, or  organomegaly . Normoactive bowel sounds.  Musculoskeletal:  . No cyanosis or clubbing . 2-3+ pitting edema of lower extremities bilaterally. Skin:  . No rashes, lesions, ulcers . palpation of skin: no induration or nodules Neurologic:  . CN 2-12 intact . Sensation all 4 extremities intact Psychiatric:  . Mental status o Mood, affect appropriate o Orientation to person, place, time  . judgment and insight appear intact  I have personally reviewed the following:   Today's Data  . Vitals, BMP, CBC, Iron studies  Cardiology Data  . Echocardiogram  Scheduled Meds: . arformoterol  15 mcg Nebulization BID  . aspirin EC  81 mg Oral Daily  . atorvastatin  40 mg Oral Daily  . azithromycin  500 mg Oral Daily  . budesonide (PULMICORT) nebulizer solution  0.5 mg Nebulization BID  . busPIRone  10 mg Oral TID  . carvedilol  3.125 mg Oral BID WC  . digoxin  0.125 mg Oral Daily  . enoxaparin (LOVENOX) injection  40 mg Subcutaneous Q24H  . furosemide  40 mg Intravenous BID  . gabapentin  300 mg Oral TID  . guaiFENesin  600 mg Oral BID  . nicotine  21 mg Transdermal Daily  . phenytoin  100 mg Oral TID  . QUEtiapine  300 mg Oral QHS  . sacubitril-valsartan  1 tablet Oral BID  . sodium chloride flush  3 mL Intravenous Once  . sodium chloride flush  3 mL Intravenous Q12H   Continuous Infusions: . sodium chloride      Principal Problem:   Syncope Active Problems:   Acute on chronic systolic CHF (congestive heart failure) (HCC)   AKI (acute kidney injury) (HCC)   Hyperlipidemia   COPD with acute exacerbation (HCC)   Microcytic anemia   Chronic hyponatremia   Seizure disorder (HCC)   LOS: 2 days   A & P  Syncope/near syncope: Acute.  Patient reportedly got up and was walking to the car when he almost passed out.  From suspect secondary to orthostatic hypotension. I appreciate cardiology's assistance.   Systolic congestive heart failure exacerbation: Acute. Reports being  previously told EF was around 25%.  Noted to have been off his medications for at least the last 10 days.  Noted to have lower extremity swelling as well as JVD on physical exam. Chest x-ray concerning for edema and possible pneumonia. Will check procalcitonin. Per cardiology recommendations Lasix has been increased to 40 mg bid. Beta blocker and ACE I have been held due to AKI and hypotension. I appreciate cardiology's assistance. Lower extremities will be kept elevated.  Suspected COPD exacerbation: Patient with significant history of tobacco abuse.  Noted to have O2 saturations around 90% on room air.  Patient noted not to be moving much air on physical exam. IV steroids. Continue beta agonist 4 times daily. Continue brovana and budesonide nebs. He has been started on azithromycin.   Iron deficiency anemia: Acute.  Records note hemoglobin noted to be  8.5 on admission and 8.8 on 5/17. Will ordere 510 mg of Feraheme x 1. Monitor hemoglobin and transfuse for hemoglobin of less than 8.  Essential hypertension: Patient is actually low normotensive without beta blocker, HCZT, hydralazine, and ACE I. It seems that the patient has been non-compliant with these medications for an unknown period of time.   Acute kidney injury: Patient's baseline creatinine previously noted to be 0.65, he presents with creatinine elevated up to 1.35 and BUN 35.  Urinalysis is relatively unremarkable. Renal ultrasound has been performed and demonstrates slightly large, but otherwise normal appearing kidneys without lesions or hydronephrosis. As the patient is not abnormally large, the large size of his kidneys may demonstrate acute renal injury.   Hyponatremia: Acute on chronic.  Sodium 125 on admission, but chronically reported to be low.  125 again this morning. Pt is on a 1 liter fluid restriction per cardiology. Will consult nephrology in the am. Urine sodium and urine osmolality are pending.  Paranoid schizophrenia: Home  medications include Invega 1.5 mL injected into skin every 30 days, Seroquel 300 mg daily, Xanax 0.5 mg twice daily as needed for anxiety, and buspirone 10 mg 3 times daily. Continue Seroquel, Xanax, buspirone.  Seizure disorder: Patient has a history of seizure disorder.  Reports being on phenytoin 100 mg 3 times daily for treatment.  Denies having any recent seizures. Continue Phenytoin. Monitor levels.  Hyperlipidemia: Previous home medications include Lipitor 40 mg daily. Continue.  Tobacco abuse: Patient's mother reports that he is a chain smoker. Nicotine patch offered.  I have seen and examined this patient myself. I have spent 42 minutes in the evaluation and care of this patient.  DVT prophylaxis: Lovenox Code Status: Full Family Communication: Discussed plan of care with  mother over the phone  Disposition Plan: The patient if from home and was cared for by his mother. She is no longer able to care for him. Anticipate discharge to SNF.  Kenisha Lynds, DO [facility] Direct contact: see [email or web address]  7PM-7AM contact night coverage as above 01/24/2020, 5:22 PM  LOS: 1 day

## 2020-01-25 LAB — BASIC METABOLIC PANEL
Anion gap: 7 (ref 5–15)
BUN: 30 mg/dL — ABNORMAL HIGH (ref 6–20)
CO2: 24 mmol/L (ref 22–32)
Calcium: 7.8 mg/dL — ABNORMAL LOW (ref 8.9–10.3)
Chloride: 94 mmol/L — ABNORMAL LOW (ref 98–111)
Creatinine, Ser: 0.82 mg/dL (ref 0.61–1.24)
GFR calc Af Amer: >60 mL/min (ref >60)
GFR calc non Af Amer: >60 mL/min (ref >60)
Glucose, Bld: 93 mg/dL (ref 70–99)
Potassium: 3.8 mmol/L (ref 3.5–5.1)
Sodium: 125 mmol/L — ABNORMAL LOW (ref 135–145)

## 2020-01-25 LAB — PROCALCITONIN: Procalcitonin: 0.19 ng/mL

## 2020-01-25 MED ORDER — TORSEMIDE 20 MG PO TABS
20.0000 mg | ORAL_TABLET | Freq: Two times a day (BID) | ORAL | Status: DC
  Administered 2020-01-25 – 2020-01-27 (×5): 20 mg via ORAL
  Filled 2020-01-25 (×5): qty 1

## 2020-01-25 NOTE — Progress Notes (Signed)
PROGRESS NOTE  Jim Dunn ZOX:[id number] DOB: [birth year] DOA: 01/21/2020 PCP: System, Pcp Not In  Brief History   Jim Dunn is a 54 y.o. male with medical history significant of CHF, COPD, hyponatremia, schizophrenia, and seizure disorder presents after having a syncopal episode.  History is difficult to obtain from the patient.  Apparently, after leaving [employer] he was walking back to the car and felt like blood rushing to his head and had a near syncopal episode.  He reports that he never lost consciousness.  He had a similar episode like this occurred 2 days ago with standing.  Patient is originally from [facility] and that is where he receives all of his care.  He was here visiting his mom and notes that he ran out of all of his medication approximately 10 days ago and was unable to get his physician to refill them.  He tried to get medications refilled by his primary  Patient reports having associated symptoms of productive cough with yellowish sputum production, shortness of breath, wheezing, and lower extremity swelling.  He has a history of congestive heart failure and remembers being told at one point in time that his heart function was around 25%.  Patient's mother able to give additional history over the phone.  He was lying on the ground when she was able to find him and she suspects that he had passed out.  He  had been living with his mother since February of this year after becoming homeless.  Since that time she has been trying to track down his primary care provider to get refills of his medications, but has been unable to do so.  Reportedly the provider is under investigation and has not been able to be reached.  She reports that the patient smokes a pack or so of cigarettes per day on average.  His mother notes that he is more than she can take care of at home and needs placement.  ED Course: Upon admission into the emergency department patient was noted to be  afebrile, heart rate is 101-116, and all other vital signs maintained.  Labs significant for WBC 10.5, hemoglobin 8.5, sodium 125, BUN 35, creatinine 1.35, BNP 697.9, and troponin 66.  Chest x-ray showed cardiomegaly with pulmonary vascular congestion, signs of edema, and a small right pleural effusion.  TRH called to admit.  The patient has been admitted to a telemetry bed. Cardiology has been consulted. Echocardiogram has been performed and demonstrated EF of 25-30% with severely decreased LV function. The LV demonstrates global hypokinesis with mild to moderate dilatation of the left ventricular internal cavity. Diastolic parameters are indeterminate. There is a D shaped left ventricle in diastole suggesting RV overload. The RV systolic function is moderately reduced and RV size is moderately enlarged. The PASP is mildly elevated with RVSP estimated to be 41.4 mmHg. LA and RA are bother severely dilated. 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   Dr. Arta Silence from nephrology was contacted today regarding the patient's ongoing hyponatremia. He stated that unless it became substantially worse, he would leave management of the hyponatremia to cardiology as they were managing the volume issue.  Consultants  . Cardiology . nephrology  Procedures  . None  Antibiotics   Anti-infectives (From admission, onward)   Start     Dose/Rate Route Frequency Ordered Stop   01/22/20 1400  azithromycin (ZITHROMAX) tablet 500 mg     500 mg Oral Daily 01/22/20 1337       Subjective  The patient is resting comfortably. He denies any new complaints.   Objective   Vitals:  Vitals:   01/25/20 1030 01/25/20 1207  BP: (!) 88/72 99/74  Pulse:  98  Resp:    Temp:  97.9 F (36.6 C)  SpO2:     Exam:  Constitutional:  . The patient is awake, alert, and oriented x 3. No acute distress. Respiratory:  . No increased work of breathing. [name] Positive for scattered rales, and rhonchi. No wheezes. . No tactile  fremitus Cardiovascular:  . Regular rate and rhythm . No ectopy or gallups . There is a 2/6 systolic murmur . No lateral PMI. No thrills. Abdomen:  . Abdomen is soft, non-tender, non-distended . No hernias, masses, or organomegaly . Normoactive bowel sounds.  Musculoskeletal:  . No cyanosis or clubbing . 2-3+ pitting edema of lower extremities bilaterally. Skin:  . No rashes, lesions, ulcers . palpation of skin: no induration or nodules Neurologic:  . CN 2-12 intact . Sensation all 4 extremities intact Psychiatric:  . Mental status o Mood, affect appropriate o Orientation to person, place, time  . judgment and insight appear intact  I have personally reviewed the following:   Today's Data  . Vitals, BMP  Cardiology Data  . Echocardiogram  Scheduled Meds: . arformoterol  15 mcg Nebulization BID  . aspirin EC  81 mg Oral Daily  . atorvastatin  40 mg Oral Daily  . azithromycin  500 mg Oral Daily  . budesonide (PULMICORT) nebulizer solution  0.5 mg Nebulization BID  . busPIRone  10 mg Oral TID  . carvedilol  3.125 mg Oral BID WC  . digoxin  0.125 mg Oral Daily  . enoxaparin (LOVENOX) injection  40 mg Subcutaneous Q24H  . gabapentin  300 mg Oral TID  . guaiFENesin  600 mg Oral BID  . nicotine  21 mg Transdermal Daily  . phenytoin  100 mg Oral TID  . QUEtiapine  300 mg Oral QHS  . sacubitril-valsartan  1 tablet Oral BID  . sodium chloride flush  3 mL Intravenous Once  . sodium chloride flush  3 mL Intravenous Q12H  . torsemide  20 mg Oral BID   Continuous Infusions: . sodium chloride      Principal Problem:   Syncope Active Problems:   Acute on chronic systolic CHF (congestive heart failure) (HCC)   AKI (acute kidney injury) ([place])   Hyperlipidemia   COPD with acute exacerbation (HCC)   Microcytic anemia   Chronic hyponatremia   Seizure disorder ([place])   LOS: 3 days   A & P  Syncope/near syncope: Acute.  Patient reportedly got up and was walking to the  car when he almost passed out.  From suspect secondary to orthostatic hypotension. I appreciate cardiology's assistance.   Systolic congestive heart failure exacerbation: Acute. Reports being previously told EF was around 25%.  Noted to have been off his medications for at least the last 10 days.  Noted to have lower extremity swelling as well as JVD on physical exam. Chest x-ray concerning for edema and possible pneumonia. Will check procalcitonin. Per cardiology recommendations Lasix has been increased to 40 mg bid. Beta blocker and ACE I have been held due to AKI and hypotension. I appreciate cardiology's assistance. Lower extremities will be kept elevated.  Suspected COPD exacerbation: Patient with significant history of tobacco abuse.  Noted to have O2 saturations around 90% on room air.  Patient noted not to be moving much air on physical  exam. IV steroids. Continue beta agonist 4 times daily. Continue brovana and budesonide nebs. He has been started on azithromycin.   Iron deficiency anemia: Acute.  Records note hemoglobin noted to be 8.5 on admission and 8.8 on 5/17. Will ordere 510 mg of Feraheme x 1. Monitor hemoglobin and transfuse for hemoglobin of less than 8.  Essential hypertension: Patient is actually low normotensive without beta blocker, HCZT, hydralazine, and ACE I. It seems that the patient has been non-compliant with these medications for an unknown period of time.   Acute kidney injury: Patient's baseline creatinine previously noted to be 0.65, he presents with creatinine elevated up to 1.35 and BUN 35.  Urinalysis is relatively unremarkable. Renal ultrasound has been performed and demonstrates slightly large, but otherwise normal appearing kidneys without lesions or hydronephrosis. As the patient is not abnormally large, the large size of his kidneys may demonstrate acute renal injury.   Hyponatremia: Acute on chronic.  Sodium 125 on admission, but chronically reported to be  low.  125 again this morning. Dr. Arta Silence from nephrology was contacted today regarding the patient's ongoing hyponatremia. He stated that unless it became substantially worse, he would leave management of the hyponatremia to cardiology as they were managing the volume issue.Pt is on a 2 liter fluid restriction per cardiology.  Urine sodium and urine osmolality are pending.  Paranoid schizophrenia: Home medications include Invega 1.5 mL injected into skin every 30 days, Seroquel 300 mg daily, Xanax 0.5 mg twice daily as needed for anxiety, and buspirone 10 mg 3 times daily. Continue Seroquel, Xanax, buspirone.  Seizure disorder: Patient has a history of seizure disorder.  Reports being on phenytoin 100 mg 3 times daily for treatment.  Denies having any recent seizures. Continue Phenytoin. Monitor levels.  Hyperlipidemia: Previous home medications include Lipitor 40 mg daily. Continue.  Tobacco abuse: Patient's mother reports that he is a chain smoker. Nicotine patch offered.  I have seen and examined this patient myself. I have spent 40 minutes in the evaluation and care of this patient.  DVT prophylaxis: Lovenox Code Status: Full Family Communication: None available Disposition Plan: The patient if from home and was cared for by his mother. She is no longer able to care for him. Anticipate discharge to SNF. Barriers to discharge: Diuresis, hyponatremia. Stabilization of patient volume.   Shamaya Kauer, DO [facility] Direct contact: see [email or web address]  7PM-7AM contact night coverage as above 01/25/2020, 2:44 PM  LOS: 1 day

## 2020-01-25 NOTE — Progress Notes (Signed)
Subjective:  Patient denies any chest pain or shortness of breath or leg swelling gradually improving.  Blood pressure remains soft.  Objective:  Vital Signs in the last 24 hours: Temp:  [97.9 F (36.6 C)-98.7 F (37.1 C)] 97.9 F (36.6 C) (05/23 0808) Pulse Rate:  [90-100] 90 (05/23 1007) Resp:  [15-20] 15 (05/23 0808) BP: (88-115)/(70-84) 88/72 (05/23 1030) SpO2:  [93 %-97 %] 97 % (05/23 0935) FiO2 (%):  [21 %] 21 % (05/23 0935) Weight:  [99.9 kg] 99.9 kg (05/23 0654)  Intake/Output from previous day: 05/22 0701 - 05/23 0700 In: 1061 [P.O.:1061] Out: 950 [Urine:950] Intake/Output from this shift: No intake/output data recorded.  Physical Exam: Neck: no adenopathy, no carotid bruit, no JVD and supple, symmetrical, trachea midline Lungs: Decreased breath sound at bases air entry has improved Heart: regular rate and rhythm, S1, S2 normal and 2/6 systolic murmur noted Abdomen: soft, non-tender; bowel sounds normal; no masses,  no organomegaly Extremities: No clubbing cyanosis 2+ edema noted left more than right  Lab Results: Recent Labs    01/23/20 0350  WBC 6.7  HGB 8.1*  PLT 180   Recent Labs    01/24/20 0441 01/25/20 0319  NA 125* 125*  K 4.6 3.8  CL 93* 94*  CO2 23 24  GLUCOSE 91 93  BUN 36* 30*  CREATININE 0.92 0.82   No results for input(s): TROPONINI in the last 72 hours.  Invalid input(s): CK, MB Hepatic Function Panel No results for input(s): PROT, ALBUMIN, AST, ALT, ALKPHOS, BILITOT, BILIDIR, IBILI in the last 72 hours. No results for input(s): CHOL in the last 72 hours. No results for input(s): PROTIME in the last 72 hours.  Imaging: Imaging results have been reviewed and No results found.  Cardiac Studies:  Assessment/Plan:  Resolving decompensated systolic congestive heart failure. Minimally elevated high sensitivity troponin I is secondary to above Nonischemic dilated cardiomyopathy. Valvular heart disease Pulmonary  hypertension Hypertensive heart disease with systolic dysfunction. Bronchitis, rule out pneumonia History of EtOH abuse. Tobacco abuse. COPD Hyperlipidemia Hypochromic microcytic anemia. Hyponatremia Paranoid schizophrenia History of seizure disorder. Status post fall. Status postAcute renal injury probably a component of cardiorenal syndrome Plan Change IV Lasix to p.o. torsemide as per orders Patient will need anemia work-up as per primary team Will uptitrate beta-blockers and Entresto as blood pressure tolerates.  Patient has seen EP in the past and follow-up with them for possible ICD evaluation if his EF remains below 35%.   LOS: 3 days    Rinaldo Cloud 01/25/2020, 10:49 AM

## 2020-01-25 NOTE — Social Work (Signed)
RE: Jim Dunn Date of Birth: [birth year]    To Whom It May Concern:  Please be advised that the above-named patient will require a short-term nursing home stay - anticipated 30 days or less for rehabilitation and strengthening.  The plan is for return home.

## 2020-01-26 LAB — BASIC METABOLIC PANEL
Anion gap: 16 — ABNORMAL HIGH (ref 5–15)
BUN: 22 mg/dL — ABNORMAL HIGH (ref 6–20)
CO2: 28 mmol/L (ref 22–32)
Calcium: 8 mg/dL — ABNORMAL LOW (ref 8.9–10.3)
Chloride: 91 mmol/L — ABNORMAL LOW (ref 98–111)
Creatinine, Ser: 0.89 mg/dL (ref 0.61–1.24)
GFR calc Af Amer: >60 mL/min (ref >60)
GFR calc non Af Amer: >60 mL/min (ref >60)
Glucose, Bld: 53 mg/dL — ABNORMAL LOW (ref 70–99)
Potassium: 3.7 mmol/L (ref 3.5–5.1)
Sodium: 135 mmol/L (ref 135–145)

## 2020-01-26 LAB — RETICULOCYTES
Immature Retic Fract: 26.1 % — ABNORMAL HIGH (ref 2.3–15.9)
RBC.: 5.2 MIL/uL (ref 4.22–5.81)
Retic Count, Absolute: 78 10*3/uL (ref 19.0–186.0)
Retic Ct Pct: 1.5 % (ref 0.4–3.1)

## 2020-01-26 LAB — HEMOGLOBIN AND HEMATOCRIT, BLOOD
HCT: 33.5 % — ABNORMAL LOW (ref 39.0–52.0)
Hemoglobin: 10.2 g/dL — ABNORMAL LOW (ref 13.0–17.0)

## 2020-01-26 LAB — FOLATE: Folate: 11.7 ng/mL (ref >5.9)

## 2020-01-26 LAB — VITAMIN B12: Vitamin B-12: 926 pg/mL — ABNORMAL HIGH (ref 180–914)

## 2020-01-26 MED ORDER — METOLAZONE 5 MG PO TABS
2.5000 mg | ORAL_TABLET | Freq: Every day | ORAL | Status: DC
  Administered 2020-01-26 – 2020-01-27 (×2): 2.5 mg via ORAL
  Filled 2020-01-26 (×2): qty 1

## 2020-01-26 MED ORDER — SODIUM CHLORIDE 0.9 % IV SOLN
510.0000 mg | Freq: Once | INTRAVENOUS | Status: AC
  Administered 2020-01-26: 510 mg via INTRAVENOUS
  Filled 2020-01-26: qty 17

## 2020-01-26 NOTE — Progress Notes (Signed)
Physical Therapy Treatment Patient Details Name: Jim Dunn MRN: [id number] DOB: [birth year] Today's Date: 01/26/2020    History of Present Illness 54 y.o. male with medical history significant of CHF, COPD, hyponatremia, schizophrenia, and seizure disorder presents after having a syncopal episode. In ED x-ray showed cardiomegaly with pulmonary vascular congestion, signs of edema, and a small right pleural effusion. Admitted 01/22/20 for treatment of systolic congestive heart failure exacerbation and suspected COPD exacerbation     PT Comments    Pt up in chair on entry, mother present in room and encourages pt to "go for a walk". Pt continues to be limited in safe mobility by decrease safety awareness and poor knowledge of his deficits, in the presence of decreased strength, balance and endurance. Pt min guard for transfers, and min A progressing to modA for ambulation with increased cuing for wider BoS and proximity to RW. D/c plans remain appropriate at this time. PT will continue to follow acutely.    Follow Up Recommendations  SNF     Equipment Recommendations  Rolling walker with 5" wheels       Precautions / Restrictions Precautions Precautions: Fall Precaution Comments: multiple falls possible syncope Restrictions Weight Bearing Restrictions: No    Mobility  Bed Mobility               General bed mobility comments: sitting up in recliner  Transfers Overall transfer level: Needs assistance   Transfers: Sit to/from Stand Sit to Stand: Min guard         General transfer comment: min guard for safety, increased effort to steady  Ambulation/Gait Ambulation/Gait assistance: Min assist;Mod assist Gait Distance (Feet): 100 Feet Assistive device: Rolling walker (2 wheeled) Gait Pattern/deviations: Step-through pattern;Decreased step length - right;Decreased step length - left;Trunk flexed;Shuffle Gait velocity: slowed Gait velocity interpretation: <1.8 ft/sec,  indicate of risk for recurrent falls General Gait Details: min A progressing to modA for steadying, multimodal cuing for proximity to RW, wider BoS, and upright posture       Balance Overall balance assessment: Needs assistance Sitting-balance support: Feet supported;No upper extremity supported Sitting balance-Leahy Scale: Fair     Standing balance support: Bilateral upper extremity supported Standing balance-Leahy Scale: Poor Standing balance comment: requires at least single UE support                             Cognition Arousal/Alertness: Awake/alert Behavior During Therapy: WFL for tasks assessed/performed Overall Cognitive Status: No family/caregiver present to determine baseline cognitive functioning Area of Impairment: Safety/judgement;Awareness;Problem solving;Following commands                       Following Commands: Follows one step commands inconsistently;Follows multi-step commands with increased time;Follows one step commands with increased time;Follows multi-step commands inconsistently Safety/Judgement: Decreased awareness of safety;Decreased awareness of deficits Awareness: Emergent Problem Solving: Slow processing;Decreased initiation;Difficulty sequencing;Requires verbal cues;Requires tactile cues General Comments: pt with increased difficulty with sequencing and command follow, as well as safety with RW, over estimates how far he can safely ambulate         General Comments General comments (skin integrity, edema, etc.): Pt's mother in room  VSS on RA      Pertinent Vitals/Pain  No/denies pain            PT Goals (current goals can now be found in the care plan section) Acute Rehab PT Goals Patient Stated Goal: get a new place  to stay PT Goal Formulation: With patient Time For Goal Achievement: 02/06/20 Potential to Achieve Goals: Fair Progress towards PT goals: Progressing toward goals    Frequency    Min  3X/week      PT Plan Current plan remains appropriate       AM-PAC PT "6 Clicks" Mobility   Outcome Measure  Help needed turning from your back to your side while in a flat bed without using bedrails?: None Help needed moving from lying on your back to sitting on the side of a flat bed without using bedrails?: A Little Help needed moving to and from a bed to a chair (including a wheelchair)?: A Little Help needed standing up from a chair using your arms (e.g., wheelchair or bedside chair)?: A Little Help needed to walk in hospital room?: A Little Help needed climbing 3-5 steps with a railing? : A Lot 6 Click Score: 18    End of Session Equipment Utilized During Treatment: Gait belt Activity Tolerance: Patient limited by fatigue Patient left: in chair;with call bell/phone within reach;with chair alarm set Nurse Communication: Mobility status PT Visit Diagnosis: Unsteadiness on feet (R26.81);Other abnormalities of gait and mobility (R26.89);Repeated falls (R29.6);Muscle weakness (generalized) (M62.81);History of falling (Z91.81);Difficulty in walking, not elsewhere classified (R26.2)     Time: 6967-8938 PT Time Calculation (min) (ACUTE ONLY): 21 min  Charges:  $Gait Training: 8-22 mins                     Giannamarie Paulus B. Beverely Risen PT, DPT Acute Rehabilitation Services Pager [phone] Office [phone]    Elon Alas Fleet 01/26/2020, 4:09 PM

## 2020-01-26 NOTE — TOC Progression Note (Addendum)
Transition of Care [facility]) - Progression Note    Patient Details  Name: Job Holtsclaw MRN: [id number] Date of Birth: May 23, 1966  Transition of Care [facility]) CM/SW Contact  Terrial Rhodes, LCSWA Phone Number: 01/26/2020, 4:10 PM  Clinical Narrative:     CSW spoke with patient and patients mother at  Bedside. Patient chose to go to [facility] for SNF placement.  Pending PASSR number and Pending insurance authorization for patient reference number is [id number].  Expected Discharge Plan: Home/Self Care Barriers to Discharge: Continued Medical Work up  Expected Discharge Plan and Services Expected Discharge Plan: Home/Self Care In-house Referral: Clinical Social Work   Post Acute Care Choice: NA Living arrangements for the past 2 months: Single Family Home                 DME Arranged: [facility] rolling(Weekend to follow)         HH Arranged: NA           Social Determinants of Health (SDOH) Interventions    Readmission Risk Interventions Readmission Risk Prevention Plan 01/23/2020  Transportation Screening Complete  HRI or Home Care Consult Complete  Social Work Consult for Recovery Care Planning/Counseling Complete  Palliative Care Screening Not Applicable  Medication Review Oceanographer) (No Data)

## 2020-01-26 NOTE — Progress Notes (Signed)
PROGRESS NOTE  Jim Dunn ZOX:[id number] DOB: 1966/08/29 DOA: 01/21/2020 PCP: System, Pcp Not In  Brief History   Jim Dunn is a 54 y.o. male with medical history significant of CHF, COPD, hyponatremia, schizophrenia, and seizure disorder presents after having a syncopal episode.  History is difficult to obtain from the patient.  Apparently, after leaving [employer] he was walking back to the car and felt like blood rushing to his head and had a near syncopal episode.  He reports that he never lost consciousness.  He had a similar episode like this occurred 2 days ago with standing.  Patient is originally from [facility] and that is where he receives all of his care.  He was here visiting his mom and notes that he ran out of all of his medication approximately 10 days ago and was unable to get his physician to refill them.  He tried to get medications refilled by his primary  Patient reports having associated symptoms of productive cough with yellowish sputum production, shortness of breath, wheezing, and lower extremity swelling.  He has a history of congestive heart failure and remembers being told at one point in time that his heart function was around 25%.  Patient's mother able to give additional history over the phone.  He was lying on the ground when she was able to find him and she suspects that he had passed out.  He  had been living with his mother since February of this year after becoming homeless.  Since that time she has been trying to track down his primary care provider to get refills of his medications, but has been unable to do so.  Reportedly the provider is under investigation and has not been able to be reached.  She reports that the patient smokes a pack or so of cigarettes per day on average.  His mother notes that he is more than she can take care of at home and needs placement.  ED Course: Upon admission into the emergency department patient was noted to be  afebrile, heart rate is 101-116, and all other vital signs maintained.  Labs significant for WBC 10.5, hemoglobin 8.5, sodium 125, BUN 35, creatinine 1.35, BNP 697.9, and troponin 66.  Chest x-ray showed cardiomegaly with pulmonary vascular congestion, signs of edema, and a small right pleural effusion.  TRH called to admit.  The patient has been admitted to a telemetry bed. Cardiology has been consulted. Echocardiogram has been performed and demonstrated EF of 25-30% with severely decreased LV function. The LV demonstrates global hypokinesis with mild to moderate dilatation of the left ventricular internal cavity. Diastolic parameters are indeterminate. There is a D shaped left ventricle in diastole suggesting RV overload. The RV systolic function is moderately reduced and RV size is moderately enlarged. The PASP is mildly elevated with RVSP estimated to be 41.4 mmHg. LA and RA are bother severely dilated. 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   Dr. Arta Silence from nephrology was contacted today regarding the patient's ongoing hyponatremia. He stated that unless it became substantially worse, he would leave management of the hyponatremia to cardiology as they were managing the volume issue.  Consultants  . Cardiology . nephrology  Procedures  . None  Antibiotics   Anti-infectives (From admission, onward)   Start     Dose/Rate Route Frequency Ordered Stop   01/22/20 1400  azithromycin (ZITHROMAX) tablet 500 mg  Status:  Discontinued     500 mg Oral Daily 01/22/20 1337 01/26/20 1626  Subjective  The patient is resting comfortably. He denies any new complaints.   Objective   Vitals:  Vitals:   01/26/20 1108 01/26/20 1645  BP:  122/80  Pulse: 95 95  Resp:    Temp: 98.6 F (37 C) 99.6 F (37.6 C)  SpO2: 95% 94%   Exam:  Constitutional:  . The patient is awake, alert, and oriented x 3. No acute distress. Respiratory:  . No increased work of breathing. [name] Positive for scattered rales,  and rhonchi. No wheezes. . No tactile fremitus Cardiovascular:  . Regular rate and rhythm . No ectopy or gallups . There is a 2/6 systolic murmur . No lateral PMI. No thrills. Abdomen:  . Abdomen is soft, non-tender, non-distended . No hernias, masses, or organomegaly . Normoactive bowel sounds.  Musculoskeletal:  . No cyanosis or clubbing . 2-3+ pitting edema of lower extremities bilaterally. Skin:  . No rashes, lesions, ulcers . palpation of skin: no induration or nodules Neurologic:  . CN 2-12 intact . Sensation all 4 extremities intact Psychiatric:  . Mental status o Mood, affect appropriate o Orientation to person, place, time  . judgment and insight appear intact  I have personally reviewed the following:   Today's Data  . Vitals, BMP  Cardiology Data  . Echocardiogram  Scheduled Meds: . arformoterol  15 mcg Nebulization BID  . aspirin EC  81 mg Oral Daily  . atorvastatin  40 mg Oral Daily  . budesonide (PULMICORT) nebulizer solution  0.5 mg Nebulization BID  . busPIRone  10 mg Oral TID  . carvedilol  3.125 mg Oral BID WC  . digoxin  0.125 mg Oral Daily  . enoxaparin (LOVENOX) injection  40 mg Subcutaneous Q24H  . gabapentin  300 mg Oral TID  . guaiFENesin  600 mg Oral BID  . metolazone  2.5 mg Oral Daily  . nicotine  21 mg Transdermal Daily  . phenytoin  100 mg Oral TID  . QUEtiapine  300 mg Oral QHS  . sacubitril-valsartan  1 tablet Oral BID  . sodium chloride flush  3 mL Intravenous Once  . sodium chloride flush  3 mL Intravenous Q12H  . torsemide  20 mg Oral BID   Continuous Infusions: . sodium chloride      Principal Problem:   Syncope Active Problems:   Acute on chronic systolic CHF (congestive heart failure) (HCC)   AKI (acute kidney injury) (HCC)   Hyperlipidemia   COPD with acute exacerbation (HCC)   Microcytic anemia   Chronic hyponatremia   Seizure disorder (HCC)   LOS: 4 days   A & P  Syncope/near syncope: Acute.  Patient  reportedly got up and was walking to the car when he almost passed out.  From suspect secondary to orthostatic hypotension. I appreciate cardiology's assistance.   Systolic congestive heart failure exacerbation: Acute. Reports being previously told EF was around 25%.  Noted to have been off his medications for at least the last 10 days.  Noted to have lower extremity swelling as well as JVD on physical exam. Chest x-ray concerning for edema and possible pneumonia. Will check procalcitonin. Per cardiology recommendations Lasix has been increased to 40 mg bid. Beta blocker and ACE I have been held due to AKI and hypotension. I appreciate cardiology's assistance. Lower extremities will be kept elevated. Cardiology to uptitrate the patient's entresto and diuretics as tolerated by blood pressure.  Suspected COPD exacerbation: Patient with significant history of tobacco abuse.  Noted to have O2  saturations around 90% on room air.  Patient noted not to be moving much air on physical exam. IV steroids. Continue beta agonist 4 times daily. Continue brovana and budesonide nebs. He has completed a course of azithromycin.   Iron deficiency anemia: Acute.  Records note hemoglobin noted to be 8.5 on admission and 8.8 on 5/17. Will ordere 510 mg of Feraheme x 1. Monitor hemoglobin and transfuse for hemoglobin of less than 8. Today's hemoglobin is 10.2.  Essential hypertension: Patient is actually low normotensive without beta blocker, HCZT, hydralazine, and ACE I. It seems that the patient has been non-compliant with these medications for an unknown period of time.   Acute kidney injury: Resolving.Patient's baseline creatinine previously noted to be 0.65, he presents with creatinine elevated up to 1.35 and BUN 35.  Urinalysis is relatively unremarkable. Renal ultrasound has been performed and demonstrates slightly large, but otherwise normal appearing kidneys without lesions or hydronephrosis. As the patient is not  abnormally large, the large size of his kidneys may demonstrate acute renal injury. Creatinine today is 0.89  Hyponatremia: Acute on chronic. Resolved. Sodium is 135 today.  Paranoid schizophrenia: Home medications include Invega 1.5 mL injected into skin every 30 days, Seroquel 300 mg daily, Xanax 0.5 mg twice daily as needed for anxiety, and buspirone 10 mg 3 times daily. Continue Seroquel, Xanax, buspirone.  Seizure disorder: Patient has a history of seizure disorder.  Reports being on phenytoin 100 mg 3 times daily for treatment.  Denies having any recent seizures. Continue Phenytoin. Monitor levels.  Hyperlipidemia: Previous home medications include Lipitor 40 mg daily. Continue.  Tobacco abuse: Patient's mother reports that he is a chain smoker. Nicotine patch offered.  I have seen and examined this patient myself. I have spent 390 minutes in the evaluation and care of this patient.  DVT prophylaxis: Lovenox Code Status: Full Family Communication: None available Disposition Plan: The patient if from home and was cared for by his mother. She is no longer able to care for him. Anticipate discharge to SNF. Barriers to discharge: . SNF placement. The patient is medically cleared for discharge.  Coulson Wehner, DO [facility] Direct contact: see [email or web address]  7PM-7AM contact night coverage as above 01/26/2020, 2:44 PM  LOS: 1 day

## 2020-01-26 NOTE — Progress Notes (Signed)
Subjective:  Patient up in chair.  Denies any chest pain or shortness of breath.  Objective:  Vital Signs in the last 24 hours: Temp:  [97.7 F (36.5 C)-98.6 F (37 C)] 98.6 F (37 C) (05/24 1108) Pulse Rate:  [91-97] 95 (05/24 1108) Resp:  [14-16] 15 (05/24 0442) BP: (90-122)/(61-80) 122/80 (05/24 0749) SpO2:  [91 %-100 %] 95 % (05/24 1108) FiO2 (%):  [21 %] 21 % (05/23 2002) Weight:  [95.3 kg] 95.3 kg (05/24 0442)  Intake/Output from previous day: 05/23 0701 - 05/24 0700 In: 720 [P.O.:720] Out: 3675 [Urine:3675] Intake/Output from this shift: Total I/O In: -  Out: 400 [Urine:400]  Physical Exam: Neck: no adenopathy, no carotid bruit, no JVD, supple, symmetrical, trachea midline and thyroid not enlarged, symmetric, no tenderness/mass/nodules Lungs: decreased breath sounds at bases with faint rales noted Abdomen: soft, non-tender; bowel sounds normal; no masses,  no organomegaly Extremities: no clubbing, cyanosis, 2+ edema noted  Lab Results: Recent Labs    01/26/20 1053  HGB 10.2*   Recent Labs    01/25/20 0319 01/26/20 0359  NA 125* 135  K 3.8 3.7  CL 94* 91*  CO2 24 28  GLUCOSE 93 53*  BUN 30* 22*  CREATININE 0.82 0.89   No results for input(s): TROPONINI in the last 72 hours.  Invalid input(s): CK, MB Hepatic Function Panel No results for input(s): PROT, ALBUMIN, AST, ALT, ALKPHOS, BILITOT, BILIDIR, IBILI in the last 72 hours. No results for input(s): CHOL in the last 72 hours. No results for input(s): PROTIME in the last 72 hours.  Imaging: Imaging results have been reviewed and No results found.  Cardiac Studies:  Assessment/Plan:  Resolving decompensated systolic congestive heart failure. Minimally elevated high sensitivity troponin I is secondary to above Nonischemic dilated cardiomyopathy. Valvular heart disease Pulmonary hypertension Hypertensive heart disease with systolic dysfunction. Bronchitis, rule out pneumonia History of EtOH  abuse. Tobacco abuse. COPD Hyperlipidemia Hypochromic microcytic anemia. Hyponatremia Paranoid schizophrenia History of seizure disorder. Status post fall. Status postAcute renal injury probably a component of cardiorenal syndrome Plan Add low-dose Zaroxolyn. Awaiting skilled nursing facility. We'll uptitrate Entresto and beta blockers as blood pressure tolerates  LOS: 4 days    Rinaldo Cloud 01/26/2020, 12:50 PM

## 2020-01-27 LAB — BASIC METABOLIC PANEL
Anion gap: 10 (ref 5–15)
BUN: 16 mg/dL (ref 6–20)
CO2: 35 mmol/L — ABNORMAL HIGH (ref 22–32)
Calcium: 8.4 mg/dL — ABNORMAL LOW (ref 8.9–10.3)
Chloride: 87 mmol/L — ABNORMAL LOW (ref 98–111)
Creatinine, Ser: 0.7 mg/dL (ref 0.61–1.24)
GFR calc Af Amer: >60 mL/min (ref >60)
GFR calc non Af Amer: >60 mL/min (ref >60)
Glucose, Bld: 90 mg/dL (ref 70–99)
Potassium: 3.2 mmol/L — ABNORMAL LOW (ref 3.5–5.1)
Sodium: 132 mmol/L — ABNORMAL LOW (ref 135–145)

## 2020-01-27 LAB — SARS CORONAVIRUS 2 BY RT PCR (HOSPITAL ORDER, PERFORMED IN ~~LOC~~ HOSPITAL LAB): SARS Coronavirus 2: NEGATIVE

## 2020-01-27 MED ORDER — POTASSIUM CHLORIDE CRYS ER 20 MEQ PO TBCR
40.0000 meq | EXTENDED_RELEASE_TABLET | Freq: Once | ORAL | Status: AC
  Administered 2020-01-27: 40 meq via ORAL
  Filled 2020-01-27: qty 2

## 2020-01-27 MED ORDER — TORSEMIDE 20 MG PO TABS: 20.0000 mg | ORAL_TABLET | Freq: Two times a day (BID) | ORAL | 0 refills | Status: DC

## 2020-01-27 MED ORDER — ALPRAZOLAM 0.5 MG PO TABS: 0.5000 mg | ORAL_TABLET | Freq: Two times a day (BID) | ORAL | 0 refills | Status: DC | PRN

## 2020-01-27 MED ORDER — METOLAZONE 2.5 MG PO TABS: 2.5000 mg | ORAL_TABLET | Freq: Every day | ORAL | 0 refills | Status: DC

## 2020-01-27 MED ORDER — CARVEDILOL 3.125 MG PO TABS: 3.1250 mg | ORAL_TABLET | Freq: Two times a day (BID) | ORAL | 0 refills | Status: DC

## 2020-01-27 MED ORDER — NICOTINE 21 MG/24HR TD PT24: 21.0000 mg | MEDICATED_PATCH | Freq: Every day | TRANSDERMAL | 0 refills | Status: DC

## 2020-01-27 MED ORDER — DIGOXIN 125 MCG PO TABS: 0.1250 mg | ORAL_TABLET | Freq: Every day | ORAL | 0 refills | Status: DC

## 2020-01-27 MED ORDER — BUDESONIDE 0.5 MG/2ML IN SUSP: 0.5000 mg | Freq: Two times a day (BID) | RESPIRATORY_TRACT | 0 refills | Status: DC

## 2020-01-27 MED ORDER — SACUBITRIL-VALSARTAN 24-26 MG PO TABS: 1.0000 | ORAL_TABLET | Freq: Two times a day (BID) | ORAL | 0 refills | Status: DC

## 2020-01-27 MED ORDER — ARFORMOTEROL TARTRATE 15 MCG/2ML IN NEBU: 15.0000 ug | INHALATION_SOLUTION | Freq: Two times a day (BID) | RESPIRATORY_TRACT | 0 refills | Status: DC

## 2020-01-27 MED ORDER — BUSPIRONE HCL 10 MG PO TABS: 10.0000 mg | ORAL_TABLET | Freq: Three times a day (TID) | ORAL | 0 refills | Status: DC

## 2020-01-27 NOTE — TOC Progression Note (Addendum)
Transition of Care [facility] - Progression Note    Patient Details  Name: Jim Dunn MRN: [id number] Date of Birth: 15-Jan-1966  [facility]) CM/SW Contact  Terrial Rhodes, LCSWA Phone Number: 01/27/2020, 9:48 AM  Clinical Narrative:     CSW received insurance authorization for patient. Reference number is [id number].Approval number is [id number]. Insurance authorization has been approved for [employer]. Next review date is the 27th. Will need to resend clinicals to [phone]. PASSR number is still under review.Tresa Endo from [facility] said we can do a Research officer, trade union for patient to possibly discharge today.   Insurance authorization has been approved. Patient has bed at [facility].  CSW will continue to follow.  Expected Discharge Plan: Home/Self Care Barriers to Discharge: Continued Medical Work up  Expected Discharge Plan and Services Expected Discharge Plan: Home/Self Care In-house Referral: Clinical Social Work   Post Acute Care Choice: NA Living arrangements for the past 2 months: Single Family Home                 DME Arranged: [facility] rolling(Weekend to follow)         HH Arranged: NA           Social Determinants of Health (SDOH) Interventions    Readmission Risk Interventions Readmission Risk Prevention Plan 01/23/2020  Transportation Screening Complete  HRI or Home Care Consult Complete  Social Work Consult for Recovery Care Planning/Counseling Complete  Palliative Care Screening Not Applicable  Medication Review Oceanographer) (No Data)

## 2020-01-27 NOTE — Progress Notes (Addendum)
Heart Failure Stewardship Pharmacist Progress Note   PCP: System, Pcp Not In PCP-Cardiologist: Dr. 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 He reported he ran out of all his medications 10 days PTA and was unable to get refills from physician. He reported symptoms of shortness of breath, LE swelling, PND, and orthopnea. He has diuresed about 11L of fluid and is down 21 lbs since admission.   Current HF Medications: Torsemide 20 mg daily Metolazone 2.5 mg daily Carvedilol 3.125 mg BID Entresto 24/26 mg BID Digoxin 0.125 mg daily  Prior to admission HF Medications: Furosemide 40 mg BID - not taking KCl 10 mEq daily - not taking Carvedilol 25 mg BID - not taking Lisinopril-HCTZ 20/25 mg daily - not taking Hydralazine 25 mg TID + Isordil 20 mg BID  - not taking   Pertinent Lab Values: . Serum creatinine 0.70, BUN 16, Potassium 3.2, Sodium 132, BNP 697.9  Vital Signs: . Weight: 200 lbs (estimated dry weight: unknown) . Blood pressure: 110/80s  . Heart rate: 90-100s   Medication Assistance / Insurance Benefits Check: Does the patient have prescription insurance?   yes Type of insurance plan: [employer]  Does the patient qualify for medication assistance through manufacturers or grants?   Pending household income information  Eligible grants and/or patient assistance programs: pending  Medication assistance applications in progress: none  Medication assistance applications approved: none  Approved medication assistance renewals will be completed by: Dr. Lucianne Muss or Dr. Annitta [place] office  Outpatient Pharmacy:  Prior to admission outpatient pharmacy: [facility] Is the patient willing to use [facility] pharmacy at discharge?   No Is the patient willing to transition their outpatient pharmacy to utilize a  [facility] outpatient pharmacy?   No - discharge to SNF. May be willing to change in the future.    Assessment: 1. Acute on chronic systolic CHF (EF 29-56%), due to nonischemic dilated cardiomyopathy. NYHA class II/III symptoms. 1+ edema noted on MD exam. - Continue torsemide 20 mg daily + metolazone 2.5 mg daily - Continue carvedilol 3.125 mg BID - Continue Entresto 24/26 mg BID - Consider adding spironolactone with +1 edema and low K today - Consider adding Farxiga prior to discharge - Feraheme x 1 given on 5/24   Plan: 1) Medication changes recommended at this time: - Add spironolactone 25 mg daily  2) Patient assistance application(s): - None pending - Both Entresto and [place] are $0 copay with patient's insurance  3)  Education  -Patient has been educated on current HF medications (torsemide, metolazone, KCl, carvedilol, Entresto, and digoxin) and potential additions to HF medication regimen (spironolactone, Farxiga) -Patient verbalizes understanding that over the next few months, these medication doses may change and more medications may be added to optimize HF regimen -Patient has been educated on basic disease state pathophysiology and goals of therapy -Time spent (30 min)   Danae Orleans, PharmD, BCPS Heart Failure Stewardship Pharmacist Phone [phone] 01/27/2020       4:18 PM

## 2020-01-27 NOTE — Progress Notes (Signed)
Attempted to call [facility] to given report. Call could not be completed at this time was the message received both times. Will attempt later.

## 2020-01-27 NOTE — Discharge Summary (Addendum)
Physician Discharge Summary  Jim Dunn AVW:[id number] DOB: [birth year] DOA: 01/21/2020  PCP: System, Pcp Not In  Admit date: 01/21/2020 Discharge date: 01/27/2020  Recommendations for Outpatient Follow-up:  1. Discharge to SNF with PT/OT 2. The patient will follow up with cardiology as directed by cardiology 3. The patient should have a chemistry, digoxin level, and dilantin level drawn on 02/01/2020 and reported to facility physician. 4. The patient should be weighed daily. Facility physician should be notified of weight gain of 3 lbs or more in 24 hours or 5 lb or more in a week.  Contact information for after-discharge care    Destination    [facility] Preferred SNF .   Service: Skilled Nursing Contact information: [address] [place] [address] [phone]               Discharge Diagnoses: Principal diagnosis is #1 1. Syncope 2. Hyponatremia 3. Exacerbation of systolic CHF 4. COPD Exacerbation 5. Fe Deficiency Anemia 6. Hypertension 7. AKI 8. Paranoid Schizophrenia 9. Seizure disorder 10. Hyperlipidemia 11. Hypokalemia  Discharge Condition: Fair  Disposition: To SNF  Diet recommendation: Heart healthy  Filed Weights   01/25/20 0654 01/26/20 0442 01/27/20 0341  Weight: 99.9 kg 95.3 kg 90.9 kg    History of present illness:  Jim Dunn is a 53 y.o. male with medical history significant of CHF, COPD, hyponatremia, schizophrenia, and seizure disorder presents after having a syncopal episode.  History is difficult to obtain from the patient.  Apparently, after leaving [employer] he was walking back to the car and felt like blood rushing to his head and had a near syncopal episode.  He reports that he never lost consciousness.  He had a similar episode like this occurred 2 days ago with standing.  Patient is originally from [facility] and that is where he receives all of his care.  He was here visiting his mom and  notes that he ran out of all of his medication approximately 10 days ago and was unable to get his physician to refill them.  He tried to get medications refilled by his primary  Patient reports having associated symptoms of productive cough with yellowish sputum production, shortness of breath, wheezing, and lower extremity swelling.  He has a history of congestive heart failure and remembers being told at one point in time that his heart function was around 25%.  Patient's mother able to give additional history over the phone.  He was lying on the ground when she was able to find him and she suspects that he had passed out.  He  had been living with his mother since February of this year after becoming homeless.  Since that time she has been trying to track down his primary care provider to get refills of his medications, but has been unable to do so.  Reportedly the provider is under investigation and has not been able to be reached.  She reports that the patient smokes a pack or so of cigarettes per day on average.  His mother notes that he is more than she can take care of at home and needs placement.   ED Course: Upon admission into the emergency department patient was noted to be afebrile, heart rate is 101-116, and all other vital signs maintained.  Labs significant for WBC 10.5, hemoglobin 8.5, sodium 125, BUN 35, creatinine 1.35, BNP 697.9, and troponin 66.  Chest x-ray showed cardiomegaly with pulmonary vascular congestion, signs of edema,  and a small right pleural effusion.  TR[facility] called to admit.  Hospital Course:  The patient has been admitted to a telemetry bed. Cardiology has been consulted. Echocardiogram has been performed and demonstrated EF of 25-30% with severely decreased LV function. The LV demonstrates global hypokinesis with mild to moderate dilatation of the left ventricular internal cavity. Diastolic parameters are indeterminate. There is a D shaped left ventricle in diastole  suggesting RV overload. The RV systolic function is moderately reduced and RV size is moderately enlarged. The PASP is mildly elevated with RVSP estimated to be 41.4 mmHg. LA and RA are bother severely dilated. 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   Hyponatremia was resolved with volume management. The patient has been started on entresto and diuretics have been up titrated. The patient is stable for discharge. He will be discharged to SNF for PT/OT.  Today's assessment: S: The patient is resting comfortably. No new complaints. O: Vitals:  Vitals:   01/27/20 0836 01/27/20 1113  BP:  118/86  Pulse:  92  Resp:  18  Temp:  98.3 F (36.8 C)  SpO2: 99% 97%   Exam:  Constitutional:  . The patient is awake, alert, and oriented x 3. No acute distress. Respiratory:  . No increased work of breathing. . No wheezes, rales, or rhonchi . No tactile fremitus Cardiovascular:  . Regular rate and rhythm . No murmurs, ectopy, or gallups. . No lateral PMI. No thrills. Abdomen:  . Abdomen is soft, non-tender, non-distended . No hernias, masses, or organomegaly . Normoactive bowel sounds.  Musculoskeletal:  . No cyanosis, clubbing, or edema Skin:  . No rashes, lesions, ulcers . palpation of skin: no induration or nodules Neurologic:  . CN 2-12 intact . Sensation all 4 extremities intact Psychiatric:  . Mental status o Mood, affect appropriate o Orientation to person, place, time  . judgment and insight appear intact   Discharge Instructions  Discharge Instructions    (HEART FAILURE PATIENTS) Call MD:  Anytime you have any of the following symptoms: 1) 3 pound weight gain in 24 hours or 5 pounds in 1 week 2) shortness of breath, with or without a dry hacking cough 3) swelling in the hands, feet or stomach 4) if you have to sleep on extra pillows at night in order to breathe.   Complete by: As directed    Activity as tolerated - No restrictions   Complete by: As directed    Call MD for:   difficulty breathing, headache or visual disturbances   Complete by: As directed    Call MD for:  persistant dizziness or light-headedness   Complete by: As directed    Call MD for:  temperature >100.4   Complete by: As directed    Diet - low sodium heart healthy   Complete by: As directed    Discharge instructions   Complete by: As directed    Discharge to SNF with PT/OT The patient will follow up with cardiology as directed by cardiology The patient should have a chemistry drawn on 02/01/2020 The patient should be weighed daily. Facility physician should be notified of weight gain of 3 lbs or more in 24 hours or 5 lb or more in a week.   Increase activity slowly   Complete by: As directed      Allergies as of 01/27/2020   No Known Allergies     Medication List    STOP taking these medications   furosemide 40 MG tablet Commonly known as: LASIX  hydrALAZINE 25 MG tablet Commonly known as: APRESOLINE   lisinopril-hydrochlorothiazide 20-25 MG tablet Commonly known as: ZESTORETIC   meloxicam 15 MG tablet Commonly known as: MOBIC   metoprolol tartrate 25 MG tablet Commonly known as: LOPRESSOR   zolpidem 10 MG tablet Commonly known as: AMBIEN     TAKE these medications   albuterol 108 (90 Base) MCG/ACT inhaler Commonly known as: VENTOLIN HFA Inhale 1-2 puffs into the lungs every 6 (six) hours as needed for wheezing or shortness of breath.   ALPRAZolam 0.5 MG tablet Commonly known as: XANAX Take 1 tablet (0.5 mg total) by mouth 2 (two) times daily as needed for anxiety. What changed:   when to take this  reasons to take this   arformoterol 15 MCG/2ML Nebu Commonly known as: BROVANA Take 2 mLs (15 mcg total) by nebulization 2 (two) times daily.   atorvastatin 40 MG tablet Commonly known as: LIPITOR Take 40 mg by mouth daily.   Bayer Aspirin EC Low Dose 81 MG EC tablet Generic drug: aspirin Take 81 mg by mouth daily.   budesonide 0.5 MG/2ML nebulizer  solution Commonly known as: PULMICORT Take 2 mLs (0.5 mg total) by nebulization 2 (two) times daily.   busPIRone 10 MG tablet Commonly known as: BUSPAR Take 10 mg by mouth 3 (three) times daily.   carvedilol 3.125 MG tablet Commonly known as: COREG Take 1 tablet (3.125 mg total) by mouth 2 (two) times daily with a meal. What changed:   medication strength  how much to take  when to take this   digoxin 0.125 MG tablet Commonly known as: LANOXIN Take 1 tablet (0.125 mg total) by mouth daily. Start taking on: Jan 28, 2020   gabapentin 300 MG capsule Commonly known as: NEURONTIN Take 300 mg by mouth 3 (three) times daily.   Hinda Glatter Sustenna 234 MG/1.5ML Susy injection Generic drug: paliperidone Inject 1.5 mLs into the muscle every 30 (thirty) days.   metolazone 2.5 MG tablet Commonly known as: ZAROXOLYN Take 1 tablet (2.5 mg total) by mouth daily. Start taking on: Jan 28, 2020   nicotine 21 mg/24hr patch Commonly known as: NICODERM CQ - dosed in mg/24 hours Place 1 patch (21 mg total) onto the skin daily. Start taking on: Jan 28, 2020   phenytoin 100 MG ER capsule Commonly known as: DILANTIN Take 1 capsule by mouth 3 (three) times daily.   potassium chloride 10 MEQ tablet Commonly known as: KLOR-CON Take 10 mEq by mouth daily.   QUEtiapine 300 MG tablet Commonly known as: SEROQUEL Take 300 mg by mouth daily.   sacubitril-valsartan 24-26 MG Commonly known as: ENTRESTO Take 1 tablet by mouth 2 (two) times daily.   Symbicort 160-4.5 MCG/ACT inhaler Generic drug: budesonide-formoterol Inhale 2 puffs into the lungs 2 (two) times daily.   torsemide 20 MG tablet Commonly known as: DEMADEX Take 1 tablet (20 mg total) by mouth 2 (two) times daily.      No Known Allergies  The results of significant diagnostics from this hospitalization (including imaging, microbiology, ancillary and laboratory) are listed below for reference.    Significant Diagnostic  Studies: US RENAL  Result Date: 01/22/2020 CLINICAL DATA:  Acute kidney injury.  Photo nerve Rea. EXAM: RENAL / URINARY TRACT ULTRASOUND COMPLETE COMPARISON:  None. FINDINGS: Right Kidney: Renal measurements: 12.0 x 4.4 x 6.9 cm = volume: 191 mL . Echogenicity within normal limits. No mass or hydronephrosis visualized. Left Kidney: Renal measurements: 11.4 x 6.8 x 5.9 cm = volume:  239 mL. Echogenicity within normal limits. No mass or hydronephrosis visualized. Bladder: Appears normal for degree of bladder distention. Other: Ascites and bilateral pleural effusions are noted. IMPRESSION: Slightly large but otherwise normal appearing kidneys. No focal lesion or hydronephrosis. Size could be within normal limits if the patient is a large person. Otherwise, there can be renal enlargement with either diabetic glomerular nephropathy or acute kidney injury. Ascites and pleural effusions. Electronically Signed   By: Paulina Fusi M.D.   On: 01/22/2020 10:29   DG Chest Port 1 View  Result Date: 01/22/2020 CLINICAL DATA:  Syncope EXAM: PORTABLE CHEST 1 VIEW COMPARISON:  None. FINDINGS: Cardiomegaly with vascular congestion. Moderate diffuse bilateral interstitial and ground-glass opacity, likely pulmonary edema. More confluent airspace disease at the bases. Possible right pleural effusion IMPRESSION: 1. Cardiomegaly with vascular congestion and diffuse bilateral interstitial and ground-glass opacity, probably reflecting edema 2. There is more confluent airspace disease at the right base, atelectasis versus pneumonia. Probable small right effusion Electronically Signed   By: Jasmine Pang M.D.   On: 01/22/2020 04:00   ECHOCARDIOGRAM COMPLETE  Result Date: 01/22/2020    ECHOCARDIOGRAM REPORT   Patient Name:   KATRELL MILHORN Date of Exam: 01/22/2020 Medical Rec #:  [id number]   Height:       68.0 in Accession #:    [id number]  Weight:       208.0 lb Date of Birth:  03/09/1966    BSA:          2.078 m Patient Age:    53 years     BP:           119/73 mmHg Patient Gender: M           HR:           112 bpm. Exam Location:  Inpatient Procedure: 2D Echo, Color Doppler and Cardiac Doppler Indications:    I50.31 Acute diastolic (congestive) heart failure  History:        Patient has no prior history of Echocardiogram examinations.                 CHF; COPD.  Sonographer:    Irving Burton Senior RDCS Referring Phys: [phone] RONDELL A SMITH  Sonographer Comments: Poor apical windows. IMPRESSIONS  1. Left ventricular ejection fraction, by estimation, is 25 to 30%. The left ventricle has severely decreased function. The left ventricle demonstrates global hypokinesis. The left ventricular internal cavity size was mildly to moderately dilated. Left ventricular diastolic parameters are indeterminate. There is the interventricular septum is flattened in diastole ('D' shaped left ventricle), consistent with right ventricular volume overload.  2. Right ventricular systolic function is moderately reduced. The right ventricular size is moderately enlarged. There is mildly elevated pulmonary artery systolic pressure. The estimated right ventricular systolic pressure is 41.4 mmHg.  3. Left atrial size was severely dilated.  4. Right atrial size was severely dilated.  5. The mitral valve is normal in structure. Moderate mitral valve regurgitation. No evidence of mitral stenosis.  6. Tricuspid valve regurgitation is severe.  7. The aortic valve is normal in structure. Aortic valve regurgitation is not visualized. No aortic stenosis is present.  8. The inferior vena cava is dilated in size with <50% respiratory variability, suggesting right atrial pressure of 15 mmHg. FINDINGS  Left Ventricle: Left ventricular ejection fraction, by estimation, is 25 to 30%. The left ventricle has severely decreased function. The left ventricle demonstrates global hypokinesis. The left ventricular internal cavity size was mildly to  moderately dilated. There is no left ventricular  hypertrophy. The interventricular septum is flattened in diastole ('D' shaped left ventricle), consistent with right ventricular volume overload. Left ventricular diastolic parameters are indeterminate. Right Ventricle: The right ventricular size is moderately enlarged. No increase in right ventricular wall thickness. Right ventricular systolic function is moderately reduced. There is mildly elevated pulmonary artery systolic pressure. The tricuspid regurgitant velocity is 2.57 m/s, and with an assumed right atrial pressure of 15 mmHg, the estimated right ventricular systolic pressure is 06.3 mmHg. Left Atrium: Left atrial size was severely dilated. Right Atrium: Right atrial size was severely dilated. Pericardium: A small pericardial effusion is present. Mitral Valve: The mitral valve is normal in structure. Normal mobility of the mitral valve leaflets. Moderate mitral valve regurgitation. No evidence of mitral valve stenosis. Tricuspid Valve: The tricuspid valve is normal in structure. Tricuspid valve regurgitation is severe. No evidence of tricuspid stenosis. Aortic Valve: The aortic valve is normal in structure. Aortic valve regurgitation is not visualized. No aortic stenosis is present. Pulmonic Valve: The pulmonic valve was normal in structure. Pulmonic valve regurgitation is mild. No evidence of pulmonic stenosis. Aorta: The aortic root and ascending aorta are structurally normal, with no evidence of dilitation. Venous: The inferior vena cava is dilated in size with less than 50% respiratory variability, suggesting right atrial pressure of 15 mmHg. IAS/Shunts: The interatrial septum was not well visualized.  LEFT VENTRICLE PLAX 2D LVIDd:         6.25 cm      Diastology LVIDs:         5.60 cm      LV e' lateral:   15.30 cm/s LV PW:         1.45 cm      LV E/e' lateral: 10.8 LV IVS:        0.95 cm      LV e' medial:    7.07 cm/s LVOT diam:     2.00 cm      LV E/e' medial:  23.5 LV SV:         30 LV SV Index:    14 LVOT Area:     3.14 cm  LV Volumes (MOD) LV vol d, MOD A2C: 218.0 ml LV vol d, MOD A4C: 208.0 ml LV vol s, MOD A2C: 146.0 ml LV vol s, MOD A4C: 149.0 ml LV SV MOD A2C:     72.0 ml LV SV MOD A4C:     208.0 ml LV SV MOD BP:      64.0 ml RIGHT VENTRICLE RV S prime:     5.77 cm/s TAPSE (M-mode): 1.1 cm LEFT ATRIUM              Index       RIGHT ATRIUM           Index LA diam:        5.70 cm  2.74 cm/m  RA Area:     38.90 cm LA Vol (A2C):   172.0 ml 82.76 ml/m RA Volume:   160.00 ml 76.99 ml/m LA Vol (A4C):   145.0 ml 69.77 ml/m LA Biplane Vol: 161.0 ml 77.47 ml/m  AORTIC VALVE LVOT Vmax:   69.50 cm/s LVOT Vmean:  54.800 cm/s LVOT VTI:    0.094 m  AORTA Ao Root diam: 2.80 cm Ao Asc diam:  3.20 cm MITRAL VALVE                TRICUSPID VALVE MV Area (PHT):  6.27 cm     TR Peak grad:   26.4 mmHg MV Decel Time: 121 msec     TR Vmax:        257.00 cm/s MV E velocity: 166.00 cm/s MV A velocity: 36.00 cm/s   SHUNTS MV E/A ratio:  4.61         Systemic VTI:  0.09 m                             Systemic Diam: 2.00 cm Weston Brass MD Electronically signed by Weston Brass MD Signature Date/Time: 01/22/2020/7:36:44 PM    Final     Microbiology: Recent Results (from the past 240 hour(s))  SARS Coronavirus 2 by RT PCR (hospital order, performed in [facility] hospital lab) Nasopharyngeal Nasopharyngeal Swab     Status: None   Collection Time: 01/22/20  4:10 AM   Specimen: Nasopharyngeal Swab  Result Value Ref Range Status   SARS Coronavirus 2 NEGATIVE NEGATIVE Final    Comment: (NOTE) SARS-CoV-2 target nucleic acids are NOT DETECTED. The SARS-CoV-2 RNA is generally detectable in upper and lower respiratory specimens during the acute phase of infection. The lowest concentration of SARS-CoV-2 viral copies this assay can detect is 250 copies / mL. A negative result does not preclude SARS-CoV-2 infection and should not be used as the sole basis for treatment or other patient management decisions.  A negative  result may occur with improper specimen collection / handling, submission of specimen other than nasopharyngeal swab, presence of viral mutation(s) within the areas targeted by this assay, and inadequate number of viral copies (<250 copies / mL). A negative result must be combined with clinical observations, patient history, and epidemiological information. Fact Sheet for Patients:   BoilerBrush.com.cy Fact Sheet for Healthcare Providers: https://pope.com/ This test is not yet approved or cleared  by the [place] FDA and has been authorized for detection and/or diagnosis of SARS-CoV-2 by FDA under an Emergency Use Authorization (EUA).  This EUA will remain in effect (meaning this test can be used) for the duration of the COVID-19 declaration under Section 564(b)(1) of the Act, 21 U.S.C. section 360bbb-3(b)(1), unless the authorization is terminated or revoked sooner. Performed at [facility], [address], [place], [place] [address]   SARS Coronavirus 2 by RT PCR (hospital order, performed in [facility] hospital lab) Nasopharyngeal Nasopharyngeal Swab     Status: None   Collection Time: 01/27/20  1:30 PM   Specimen: Nasopharyngeal Swab  Result Value Ref Range Status   SARS Coronavirus 2 NEGATIVE NEGATIVE Final    Comment: (NOTE) SARS-CoV-2 target nucleic acids are NOT DETECTED. The SARS-CoV-2 RNA is generally detectable in upper and lower respiratory specimens during the acute phase of infection. The lowest concentration of SARS-CoV-2 viral copies this assay can detect is 250 copies / mL. A negative result does not preclude SARS-CoV-2 infection and should not be used as the sole basis for treatment or other patient management decisions.  A negative result may occur with improper specimen collection / handling, submission of specimen other than nasopharyngeal swab, presence of viral mutation(s) within the areas targeted  by this assay, and inadequate number of viral copies (<250 copies / mL). A negative result must be combined with clinical observations, patient history, and epidemiological information. Fact Sheet for Patients:   BoilerBrush.com.cy Fact Sheet for Healthcare Providers: https://pope.com/ This test is not yet approved or cleared  by the [place]  FDA and has been authorized for detection and/or diagnosis of SARS-CoV-2 by FDA under an Emergency Use Authorization (EUA).  This EUA will remain in effect (meaning this test can be used) for the duration of the COVID-19 declaration under Section 564(b)(1) of the Act, 21 U.S.C. section 360bbb-3(b)(1), unless the authorization is terminated or revoked sooner. Performed at [facility], [address] [place], [place] [address]      Labs: Basic Metabolic Panel: Recent Labs  Lab 01/23/20 0350 01/24/20 0441 01/25/20 0319 01/26/20 0359 01/27/20 0418  NA 126* 125* 125* 135 132*  K 4.7 4.6 3.8 3.7 3.2*  CL 94* 93* 94* 91* 87*  CO2 35*  GLUCOSE 150* 91 93 53* 90  BUN 32* 36* 30* 22* 16  CREATININE 0.84 0.92 0.82 0.89 0.70  CALCIUM 8.1* 8.2* 7.8* 8.0* 8.4*   Liver Function Tests: No results for input(s): AST, ALT, ALKPHOS, BILITOT, PROT, ALBUMIN in the last 168 hours. No results for input(s): LIPASE, AMYLASE in the last 168 hours. No results for input(s): AMMONIA in the last 168 hours. CBC: Recent Labs  Lab 01/21/20 2026 01/23/20 0350 01/26/20 1053  WBC 10.5 6.7  --   HGB 8.5* 8.1* 10.2*  HCT 27.2* 25.6* 33.5*  MCV 63.3* 61.7*  --   PLT 201 180  --    Cardiac Enzymes: No results for input(s): CKTOTAL, CKMB, CKMBINDEX, TROPONINI in the last 168 hours. BNP: BNP (last 3 results) Recent Labs    01/22/20 0410  BNP 697.9*    ProBNP (last 3 results) No results for input(s): PROBNP in the last 8760 hours.  CBG: Recent Labs  Lab 01/21/20 2024  GLUCAP 116*      Principal Problem:   Syncope Active Problems:   Acute on chronic systolic CHF (congestive heart failure) (HCC)   AKI (acute kidney injury) (HCC)   Hyperlipidemia   COPD with acute exacerbation (HCC)   Microcytic anemia   Chronic hyponatremia   Seizure disorder (HCC)   Time coordinating discharge: 38 minutes  Signed:        Emmely Bittinger, DO [facility]  01/27/2020, 3:44 PM

## 2020-01-27 NOTE — [place] FL2 (Signed)
[place] MEDICAID FL2 LEVEL OF CARE SCREENING TOOL     IDENTIFICATION  Patient Name: Jim Dunn Birthdate: June 08, 1966 Sex: male Admission Date (Current Location): 01/21/2020  [facility] and [place] Number:  Producer, television/film/video and Address:  [facility]. [facility], [address], [place], [place] [address]      Provider Number: [id number]  Attending Physician Name and Address:  Fran Lowes, DO  Relative Name and Phone Number:       Current Level of Care: Hospital Recommended Level of Care: Skilled Nursing Facility Prior Approval Number:    Date Approved/Denied:   PASRR Number: pending  Discharge Plan: SNF    Current Diagnoses: Patient Active Problem List   Diagnosis Date Noted  . Syncope 01/22/2020  . Acute on chronic systolic CHF (congestive heart failure) (HCC) 01/22/2020  . AKI (acute kidney injury) (HCC) 01/22/2020  . Hyperlipidemia 01/22/2020  . COPD with acute exacerbation (HCC) 01/22/2020  . Microcytic anemia 01/22/2020  . Chronic hyponatremia 01/22/2020  . Seizure disorder (HCC) 01/22/2020    Orientation RESPIRATION BLADDER Height & Weight     Self, Situation, Place, Time  O2 Continent Weight: 200 lb 6.4 oz (90.9 kg) Height:  5\' 8"  (172.7 cm)  BEHAVIORAL SYMPTOMS/MOOD NEUROLOGICAL BOWEL NUTRITION STATUS    Convulsions/Seizures Continent Diet(See Discharge Summary)  AMBULATORY STATUS COMMUNICATION OF NEEDS Skin   Limited Assist Verbally Normal                       Personal Care Assistance Level of Assistance  Bathing, Dressing Bathing Assistance: Limited assistance   Dressing Assistance: Limited assistance     Functional Limitations Info             SPECIAL CARE FACTORS FREQUENCY  PT (By licensed PT), OT (By licensed OT)     PT Frequency: 5x min weekly OT Frequency: 5x min weekly            Contractures Contractures Info: Not present    Additional Factors Info  Code Status, Allergies, Psychotropic Code Status  Info: FULL CODE Allergies Info: No none allergies Psychotropic Info: busPIRone (BUSPAR) tablet 10 mg 3x daily PO ;QUEtiapine (SEROQUEL) tablet 300 mg daily at bedtime po         Current Medications (01/27/2020):  This is the current hospital active medication list Current Facility-Administered Medications  Medication Dose Route Frequency Provider Last Rate Last Admin  . 0.9 %  sodium chloride infusion  250 mL Intravenous PRN 01/29/2020 A, MD      . acetaminophen (TYLENOL) tablet 650 mg  650 mg Oral Q4H PRN Madelyn Flavors A, MD   650 mg at 01/26/20 1902  . albuterol (PROVENTIL) (2.5 MG/3ML) 0.083% nebulizer solution 2.5 mg  2.5 mg Nebulization Q6H PRN Swayze, Ava, DO      . ALPRAZolam (XANAX) tablet 0.5 mg  0.5 mg Oral BID PRN Swayze, Ava, DO   0.5 mg at 01/26/20 1902  . arformoterol (BROVANA) nebulizer solution 15 mcg  15 mcg Nebulization BID 01/28/20 A, MD   15 mcg at 01/27/20 0835  . aspirin EC tablet 81 mg  81 mg Oral Daily Smith, Rondell A, MD   81 mg at 01/27/20 1019  . atorvastatin (LIPITOR) tablet 40 mg  40 mg Oral Daily Smith, Rondell A, MD   40 mg at 01/27/20 1020  . budesonide (PULMICORT) nebulizer solution 0.5 mg  0.5 mg Nebulization BID 01/29/20, Rondell A, MD   0.5 mg  at 01/27/20 0835  . busPIRone (BUSPAR) tablet 10 mg  10 mg Oral TID Fuller Plan A, MD   10 mg at 01/27/20 1020  . carvedilol (COREG) tablet 3.125 mg  3.125 mg Oral BID WC Charolette Forward, MD   3.125 mg at 01/27/20 1020  . digoxin (LANOXIN) tablet 0.125 mg  0.125 mg Oral Daily Charolette Forward, MD   0.125 mg at 01/27/20 1019  . enoxaparin (LOVENOX) injection 40 mg  40 mg Subcutaneous Q24H Smith, Rondell A, MD   40 mg at 01/27/20 1021  . gabapentin (NEURONTIN) capsule 300 mg  300 mg Oral TID Fuller Plan A, MD   300 mg at 01/27/20 1020  . guaiFENesin (MUCINEX) 12 hr tablet 600 mg  600 mg Oral BID Tamala Julian, Rondell A, MD   600 mg at 01/27/20 1020  . metolazone (ZAROXOLYN) tablet 2.5 mg  2.5 mg Oral Daily Charolette Forward, MD   2.5 mg at 01/27/20 1019  . nicotine (NICODERM CQ - dosed in mg/24 hours) patch 21 mg  21 mg Transdermal Daily Tamala Julian, Rondell A, MD   21 mg at 01/22/20 1355  . ondansetron (ZOFRAN) injection 4 mg  4 mg Intravenous Q6H PRN Fuller Plan A, MD      . phenytoin (DILANTIN) ER capsule 100 mg  100 mg Oral TID Fuller Plan A, MD   100 mg at 01/27/20 1019  . QUEtiapine (SEROQUEL) tablet 300 mg  300 mg Oral QHS Blenda Nicely, RPH   300 mg at 01/26/20 2213  . sacubitril-valsartan (ENTRESTO) 24-26 mg per tablet  1 tablet Oral BID Charolette Forward, MD   1 tablet at 01/27/20 1020  . sodium chloride flush (NS) 0.9 % injection 3 mL  3 mL Intravenous Once Smith, Rondell A, MD      . sodium chloride flush (NS) 0.9 % injection 3 mL  3 mL Intravenous Q12H Smith, Rondell A, MD   3 mL at 01/27/20 1021  . sodium chloride flush (NS) 0.9 % injection 3 mL  3 mL Intravenous PRN Tamala Julian, Rondell A, MD      . torsemide (DEMADEX) tablet 20 mg  20 mg Oral BID Charolette Forward, MD   20 mg at 01/27/20 1021     Discharge Medications: Please see discharge summary for a list of discharge medications.  Relevant Imaging Results:  Relevant Lab Results:   Additional Information SS# [id number]  Trula Ore, LCSWA

## 2020-01-27 NOTE — TOC Transition Note (Signed)
Transition of Care [facility]) - CM/SW Discharge Note   Patient Details  Name: Jim Dunn MRN: [id number] Date of Birth: 1966/02/18  Transition of Care [facility]) CM/SW Contact:  Terrial Rhodes, LCSWA Phone Number: 01/27/2020, 4:15 PM   Clinical Narrative:     Patient will DC to: [facility]  Anticipated DC date: 01/27/2020  Family notified: [name]  Transport by: Sharin Mons  ?  Per MD patient ready for DC to . RN, patient, patient's family, and facility notified of DC. Discharge Summary sent to facility. RN given number for report tele# [phone] Room number 126B. DC packet on chart. Ambulance transport requested for patient.  CSW signing off.  Final next level of care: Skilled Nursing Facility Barriers to Discharge: No Barriers Identified   Patient Goals and CMS Choice Patient states their goals for this hospitalization and ongoing recovery are:: to go to skilled nursing facility CMS Medicare.gov Compare Post Acute Care list provided to:: Patient Choice offered to / list presented to : Patient  Discharge Placement              Patient chooses bed at: [facility] Patient to be transferred to facility by: [facility] Name of family member notified: [name] Patient and family notified of of transfer: 01/27/20  Discharge Plan and Services In-house Referral: Clinical Social Work   Post Acute Care Choice: NA          DME Arranged: Ephraim Hamburger rolling(Weekend to follow)         HH Arranged: NA          Social Determinants of Health (SDOH) Interventions     Readmission Risk Interventions Readmission Risk Prevention Plan 01/23/2020  Transportation Screening Complete  HRI or Home Care Consult Complete  Social Work Consult for Recovery Care Planning/Counseling Complete  Palliative Care Screening Not Applicable  Medication Review Oceanographer) (No Data)

## 2020-01-27 NOTE — Progress Notes (Signed)
Subjective:  Doing well.  Denies any chest pain or shortness of breath.  Objective:  Vital Signs in the last 24 hours: Temp:  [98.2 F (36.8 C)-99.6 F (37.6 C)] 98.3 F (36.8 C) (05/25 1113) Pulse Rate:  [92-98] 92 (05/25 1113) Resp:  [14-18] 18 (05/25 1113) BP: (109-136)/(66-91) 118/86 (05/25 1113) SpO2:  [94 %-100 %] 97 % (05/25 1113) Weight:  [90.9 kg] 90.9 kg (05/25 0341)  Intake/Output from previous day: 05/24 0701 - 05/25 0700 In: -  Out: 3500 [Urine:3500] Intake/Output from this shift: Total I/O In: 250 [P.O.:250] Out: 725 [Urine:725]  Physical Exam: Neck: no adenopathy, no carotid bruit, no JVD and supple, symmetrical, trachea midline Lungs: clear to auscultation bilaterally Heart: regular rate and rhythm, S1, S2 normal and soft systolic murmur noted Abdomen: soft, non-tender; bowel sounds normal; no masses,  no organomegaly Extremities: no clubbing, cyanosis, 1+ edema noted  Lab Results: Recent Labs    01/26/20 1053  HGB 10.2*   Recent Labs    01/26/20 0359 01/27/20 0418  NA 135 132*  K 3.7 3.2*  CL 91* 87*  CO2 28 35*  GLUCOSE 53* 90  BUN 22* 16  CREATININE 0.89 0.70   No results for input(s): TROPONINI in the last 72 hours.  Invalid input(s): CK, MB Hepatic Function Panel No results for input(s): PROT, ALBUMIN, AST, ALT, ALKPHOS, BILITOT, BILIDIR, IBILI in the last 72 hours. No results for input(s): CHOL in the last 72 hours. No results for input(s): PROTIME in the last 72 hours.  Imaging: Imaging results have been reviewed and No results found.  Cardiac Studies:  Assessment/Plan:  Compensated  systolic congestive heart failure. Minimally elevated high sensitivity troponin I is secondary to above Nonischemic dilated cardiomyopathy. Valvular heart disease Pulmonary hypertension hypokalemia Hypertensive heart disease with systolic dysfunction. Bronchitis, rule out pneumonia History of EtOH abuse. Tobacco  abuse. COPD Hyperlipidemia Hypochromic microcytic anemia. Hyponatremia Paranoid schizophrenia History of seizure disorder. Status post fall. Status postAcute renal injury probably a component of cardiorenal syndrome Plan Replace K Continue present management. I will sign off.  Please call if needed.  Follow-up with his cardiologist Dr. Lucianne Muss in one to 2 weeks or with me if he is in town here in [place].  LOS: 5 days    Rinaldo Cloud 01/27/2020, 12:30 PM

## 2020-02-19 ENCOUNTER — Inpatient Hospital Stay (HOSPITAL_COMMUNITY)
Admit: 2020-02-19 | Discharge: 2020-02-21 | DRG: 641 | Discharge status: Against Medical Advice | Payer: Medicare PPO | Attending: Internal Medicine | Admitting: Internal Medicine

## 2020-02-19 ENCOUNTER — Encounter (HOSPITAL_COMMUNITY): Payer: Self-pay

## 2020-02-19 ENCOUNTER — Other Ambulatory Visit: Payer: Self-pay

## 2020-02-19 ENCOUNTER — Emergency Department (HOSPITAL_COMMUNITY): Payer: Medicare PPO

## 2020-02-19 DIAGNOSIS — J449 Chronic obstructive pulmonary disease, unspecified: Secondary | ICD-10-CM | POA: Diagnosis not present

## 2020-02-19 DIAGNOSIS — G930 Cerebral cysts: Secondary | ICD-10-CM | POA: Diagnosis present

## 2020-02-19 DIAGNOSIS — E785 Hyperlipidemia, unspecified: Secondary | ICD-10-CM | POA: Diagnosis present

## 2020-02-19 DIAGNOSIS — I428 Other cardiomyopathies: Secondary | ICD-10-CM | POA: Diagnosis present

## 2020-02-19 DIAGNOSIS — Z20822 Contact with and (suspected) exposure to covid-19: Secondary | ICD-10-CM | POA: Diagnosis present

## 2020-02-19 DIAGNOSIS — G629 Polyneuropathy, unspecified: Secondary | ICD-10-CM | POA: Diagnosis present

## 2020-02-19 DIAGNOSIS — I445 Left posterior fascicular block: Secondary | ICD-10-CM | POA: Diagnosis present

## 2020-02-19 DIAGNOSIS — E876 Hypokalemia: Secondary | ICD-10-CM

## 2020-02-19 DIAGNOSIS — E871 Hypo-osmolality and hyponatremia: Secondary | ICD-10-CM | POA: Diagnosis not present

## 2020-02-19 DIAGNOSIS — Z79899 Other long term (current) drug therapy: Secondary | ICD-10-CM

## 2020-02-19 DIAGNOSIS — E861 Hypovolemia: Secondary | ICD-10-CM | POA: Diagnosis present

## 2020-02-19 DIAGNOSIS — E860 Dehydration: Secondary | ICD-10-CM | POA: Diagnosis present

## 2020-02-19 DIAGNOSIS — Z87442 Personal history of urinary calculi: Secondary | ICD-10-CM

## 2020-02-19 DIAGNOSIS — F200 Paranoid schizophrenia: Secondary | ICD-10-CM | POA: Diagnosis present

## 2020-02-19 DIAGNOSIS — Z7982 Long term (current) use of aspirin: Secondary | ICD-10-CM

## 2020-02-19 DIAGNOSIS — F17210 Nicotine dependence, cigarettes, uncomplicated: Secondary | ICD-10-CM | POA: Diagnosis present

## 2020-02-19 DIAGNOSIS — Z7951 Long term (current) use of inhaled steroids: Secondary | ICD-10-CM

## 2020-02-19 DIAGNOSIS — I5022 Chronic systolic (congestive) heart failure: Secondary | ICD-10-CM | POA: Diagnosis not present

## 2020-02-19 DIAGNOSIS — G40909 Epilepsy, unspecified, not intractable, without status epilepticus: Secondary | ICD-10-CM | POA: Diagnosis present

## 2020-02-19 DIAGNOSIS — Z5329 Procedure and treatment not carried out because of patient's decision for other reasons: Secondary | ICD-10-CM | POA: Diagnosis not present

## 2020-02-19 DIAGNOSIS — Z7289 Other problems related to lifestyle: Secondary | ICD-10-CM

## 2020-02-19 LAB — COMPREHENSIVE METABOLIC PANEL
ALT: 24 U/L (ref 0–44)
AST: 33 U/L (ref 15–41)
Albumin: 3.5 g/dL (ref 3.5–5.0)
Alkaline Phosphatase: 299 U/L — ABNORMAL HIGH (ref 38–126)
Anion gap: 14 (ref 5–15)
BUN: 25 mg/dL — ABNORMAL HIGH (ref 6–20)
CO2: 31 mmol/L (ref 22–32)
Calcium: 9.2 mg/dL (ref 8.9–10.3)
Chloride: 79 mmol/L — ABNORMAL LOW (ref 98–111)
Creatinine, Ser: 1.05 mg/dL (ref 0.61–1.24)
GFR calc Af Amer: >60 mL/min (ref >60)
GFR calc non Af Amer: >60 mL/min (ref >60)
Glucose, Bld: 94 mg/dL (ref 70–99)
Potassium: 2.8 mmol/L — ABNORMAL LOW (ref 3.5–5.1)
Sodium: 124 mmol/L — ABNORMAL LOW (ref 135–145)
Total Bilirubin: 1 mg/dL (ref 0.3–1.2)
Total Protein: 7.5 g/dL (ref 6.5–8.1)

## 2020-02-19 LAB — DIFFERENTIAL
Abs Immature Granulocytes: 0 10*3/uL (ref 0.00–0.07)
Basophils Absolute: 0 10*3/uL (ref 0.0–0.1)
Basophils Relative: 0 %
Eosinophils Absolute: 0.5 10*3/uL (ref 0.0–0.5)
Eosinophils Relative: 6 %
Lymphocytes Relative: 38 %
Lymphs Abs: 3.4 10*3/uL (ref 0.7–4.0)
Monocytes Absolute: 0.9 10*3/uL (ref 0.1–1.0)
Monocytes Relative: 10 %
Neutro Abs: 4.1 10*3/uL (ref 1.7–7.7)
Neutrophils Relative %: 46 %
nRBC: 0 /100 WBC

## 2020-02-19 LAB — CBC
HCT: 40.5 % (ref 39.0–52.0)
Hemoglobin: 12.6 g/dL — ABNORMAL LOW (ref 13.0–17.0)
MCH: 19.8 pg — ABNORMAL LOW (ref 26.0–34.0)
MCHC: 31.1 g/dL (ref 30.0–36.0)
MCV: 63.8 fL — ABNORMAL LOW (ref 80.0–100.0)
Platelets: 287 10*3/uL (ref 150–400)
RBC: 6.35 MIL/uL — ABNORMAL HIGH (ref 4.22–5.81)
RDW: 27.1 % — ABNORMAL HIGH (ref 11.5–15.5)
WBC: 8.7 10*3/uL (ref 4.0–10.5)
nRBC: 0 % (ref 0.0–0.2)

## 2020-02-19 LAB — URINALYSIS, ROUTINE W REFLEX MICROSCOPIC
Bilirubin Urine: NEGATIVE
Glucose, UA: NEGATIVE mg/dL
Hgb urine dipstick: NEGATIVE
Ketones, ur: NEGATIVE mg/dL
Leukocytes,Ua: NEGATIVE
Nitrite: NEGATIVE
Protein, ur: NEGATIVE mg/dL
Specific Gravity, Urine: 1.004 — ABNORMAL LOW (ref 1.005–1.030)
pH: 7 (ref 5.0–8.0)

## 2020-02-19 LAB — CBG MONITORING, ED: Glucose-Capillary: 102 mg/dL — ABNORMAL HIGH (ref 70–99)

## 2020-02-19 LAB — MAGNESIUM: Magnesium: 1.5 mg/dL — ABNORMAL LOW (ref 1.7–2.4)

## 2020-02-19 LAB — LACTIC ACID, PLASMA: Lactic Acid, Venous: 1.4 mmol/L (ref 0.5–1.9)

## 2020-02-19 LAB — PROTIME-INR
INR: 1.1 (ref 0.8–1.2)
Prothrombin Time: 13.6 seconds (ref 11.4–15.2)

## 2020-02-19 LAB — TROPONIN I (HIGH SENSITIVITY): Troponin I (High Sensitivity): 11 ng/L (ref <18)

## 2020-02-19 LAB — APTT: aPTT: 32 seconds (ref 24–36)

## 2020-02-19 MED ORDER — NICOTINE 21 MG/24HR TD PT24
21.0000 mg | MEDICATED_PATCH | Freq: Every day | TRANSDERMAL | Status: DC
  Administered 2020-02-20 (×2): 21 mg via TRANSDERMAL
  Filled 2020-02-19 (×2): qty 1

## 2020-02-19 MED ORDER — SACUBITRIL-VALSARTAN 24-26 MG PO TABS
1.0000 | ORAL_TABLET | Freq: Two times a day (BID) | ORAL | Status: DC
  Filled 2020-02-19: qty 1

## 2020-02-19 MED ORDER — POTASSIUM CHLORIDE 10 MEQ/100ML IV SOLN
10.0000 meq | Freq: Once | INTRAVENOUS | Status: AC
  Administered 2020-02-19: 10 meq via INTRAVENOUS
  Filled 2020-02-19: qty 100

## 2020-02-19 MED ORDER — ENOXAPARIN SODIUM 40 MG/0.4ML ~~LOC~~ SOLN
40.0000 mg | Freq: Every day | SUBCUTANEOUS | Status: DC
  Administered 2020-02-20: 40 mg via SUBCUTANEOUS
  Filled 2020-02-19: qty 0.4

## 2020-02-19 MED ORDER — POTASSIUM CHLORIDE CRYS ER 20 MEQ PO TBCR
40.0000 meq | EXTENDED_RELEASE_TABLET | Freq: Once | ORAL | Status: AC
  Administered 2020-02-19: 40 meq via ORAL
  Filled 2020-02-19: qty 2

## 2020-02-19 MED ORDER — ONDANSETRON HCL 4 MG/2ML IJ SOLN: 4.0000 mg | Freq: Four times a day (QID) | INTRAMUSCULAR | Status: DC | PRN

## 2020-02-19 MED ORDER — MOMETASONE FURO-FORMOTEROL FUM 200-5 MCG/ACT IN AERO
2.0000 | INHALATION_SPRAY | Freq: Two times a day (BID) | RESPIRATORY_TRACT | Status: DC
  Administered 2020-02-20: 2 via RESPIRATORY_TRACT
  Filled 2020-02-19: qty 8.8

## 2020-02-19 MED ORDER — ASPIRIN EC 81 MG PO TBEC
81.0000 mg | DELAYED_RELEASE_TABLET | Freq: Every day | ORAL | Status: DC
  Administered 2020-02-20: 81 mg via ORAL
  Filled 2020-02-19 (×2): qty 1

## 2020-02-19 MED ORDER — SODIUM CHLORIDE 0.9% FLUSH: 3.0000 mL | Freq: Once | INTRAVENOUS | Status: DC

## 2020-02-19 MED ORDER — CARVEDILOL 3.125 MG PO TABS
3.1250 mg | ORAL_TABLET | Freq: Two times a day (BID) | ORAL | Status: DC
  Administered 2020-02-20 (×2): 3.125 mg via ORAL
  Filled 2020-02-19 (×3): qty 1

## 2020-02-19 MED ORDER — ALBUTEROL SULFATE (2.5 MG/3ML) 0.083% IN NEBU: 2.5000 mg | INHALATION_SOLUTION | Freq: Four times a day (QID) | RESPIRATORY_TRACT | Status: DC | PRN

## 2020-02-19 MED ORDER — SODIUM CHLORIDE 0.9 % IV BOLUS
500.0000 mL | Freq: Once | INTRAVENOUS | Status: AC
  Administered 2020-02-19: 500 mL via INTRAVENOUS

## 2020-02-19 MED ORDER — MAGNESIUM SULFATE 2 GM/50ML IV SOLN
2.0000 g | Freq: Once | INTRAVENOUS | Status: DC
  Filled 2020-02-19: qty 50

## 2020-02-19 MED ORDER — SENNOSIDES-DOCUSATE SODIUM 8.6-50 MG PO TABS: 1.0000 | ORAL_TABLET | Freq: Every evening | ORAL | Status: DC | PRN

## 2020-02-19 MED ORDER — ATORVASTATIN CALCIUM 40 MG PO TABS
40.0000 mg | ORAL_TABLET | Freq: Every day | ORAL | Status: DC
  Administered 2020-02-20: 40 mg via ORAL
  Filled 2020-02-19 (×2): qty 1

## 2020-02-19 MED ORDER — ACETAMINOPHEN 650 MG RE SUPP: 650.0000 mg | Freq: Four times a day (QID) | RECTAL | Status: DC | PRN

## 2020-02-19 MED ORDER — BUSPIRONE HCL 5 MG PO TABS
10.0000 mg | ORAL_TABLET | Freq: Three times a day (TID) | ORAL | Status: DC
  Administered 2020-02-20 (×4): 10 mg via ORAL
  Filled 2020-02-19 (×4): qty 2

## 2020-02-19 MED ORDER — QUETIAPINE FUMARATE 100 MG PO TABS
300.0000 mg | ORAL_TABLET | Freq: Every day | ORAL | Status: DC
  Administered 2020-02-20 (×2): 300 mg via ORAL
  Filled 2020-02-19: qty 3
  Filled 2020-02-19: qty 1
  Filled 2020-02-19: qty 3

## 2020-02-19 MED ORDER — SODIUM CHLORIDE 0.9% FLUSH: 3.0000 mL | INTRAVENOUS | Status: DC | PRN

## 2020-02-19 MED ORDER — ALPRAZOLAM 0.5 MG PO TABS
0.5000 mg | ORAL_TABLET | Freq: Two times a day (BID) | ORAL | Status: DC | PRN
  Administered 2020-02-20: 0.5 mg via ORAL
  Filled 2020-02-19: qty 1

## 2020-02-19 MED ORDER — POTASSIUM CHLORIDE CRYS ER 10 MEQ PO TBCR: 10.0000 meq | EXTENDED_RELEASE_TABLET | Freq: Every day | ORAL | Status: DC

## 2020-02-19 MED ORDER — ACETAMINOPHEN 325 MG PO TABS
650.0000 mg | ORAL_TABLET | Freq: Four times a day (QID) | ORAL | Status: DC | PRN
  Administered 2020-02-20 (×2): 650 mg via ORAL
  Filled 2020-02-19 (×2): qty 2

## 2020-02-19 MED ORDER — PHENYTOIN SODIUM EXTENDED 100 MG PO CAPS
100.0000 mg | ORAL_CAPSULE | Freq: Three times a day (TID) | ORAL | Status: DC
  Administered 2020-02-20 (×4): 100 mg via ORAL
  Filled 2020-02-19 (×5): qty 1

## 2020-02-19 MED ORDER — DIGOXIN 125 MCG PO TABS
0.1250 mg | ORAL_TABLET | Freq: Every day | ORAL | Status: DC
  Administered 2020-02-20: 0.125 mg via ORAL
  Filled 2020-02-19 (×2): qty 1

## 2020-02-19 MED ORDER — GABAPENTIN 300 MG PO CAPS
300.0000 mg | ORAL_CAPSULE | Freq: Three times a day (TID) | ORAL | Status: DC
  Administered 2020-02-20 (×4): 300 mg via ORAL
  Filled 2020-02-19 (×4): qty 1

## 2020-02-19 MED ORDER — SODIUM CHLORIDE 0.9% FLUSH: 3.0000 mL | Freq: Two times a day (BID) | INTRAVENOUS | Status: DC

## 2020-02-19 MED ORDER — SODIUM CHLORIDE 0.9 % IV SOLN: 250.0000 mL | INTRAVENOUS | Status: DC | PRN

## 2020-02-19 MED ORDER — SODIUM CHLORIDE 0.9% FLUSH
3.0000 mL | Freq: Two times a day (BID) | INTRAVENOUS | Status: DC
  Administered 2020-02-20: 3 mL via INTRAVENOUS

## 2020-02-19 MED ORDER — ONDANSETRON HCL 4 MG PO TABS: 4.0000 mg | ORAL_TABLET | Freq: Four times a day (QID) | ORAL | Status: DC | PRN

## 2020-02-19 NOTE — ED Triage Notes (Signed)
Pt arrives to ED w/ c/o intermittent confusion and numbness in bilat hands and feet x 1 week. Pt AOx4 in triage. Pt denies headache, slurred speech, unilateral weakness.

## 2020-02-19 NOTE — ED Provider Notes (Signed)
[facility] EMERGENCY DEPARTMENT Provider Note   CSN: [id number] Arrival date & time: 02/19/20  1952     History Chief Complaint  Patient presents with  . Stroke Symptoms    Jim Dunn is a 54 y.o. male.  HPI 54 year old male presents with generalized weakness.  The patient is a vague historian.  He is mostly complaining of just overall not feeling well as well as some memory difficulties.  These all have been ongoing for about 2 months.  This includes problems with memory as well as bilateral leg numbness.  Mostly in his feet.  He is also noticed some fingertip numbness bilaterally.  Denies a headache though he states his "mind hurts".  No neck pain, chest pain, shortness of breath or abdominal pain.  No vomiting or diarrhea in the last week or so.  His mom told him to come to the hospital because he might be dehydrated and he feels dehydrated.  No focal weakness.   Past Medical History:  Diagnosis Date  . CHF (congestive heart failure) (HCC)   . COPD (chronic obstructive pulmonary disease) (HCC)   . History of kidney stones   . Paranoid schizophrenia (HCC)   . Seizures (HCC)   . Tobacco abuse     Patient Active Problem List   Diagnosis Date Noted  . Hyponatremia 02/19/2020  . Paranoid schizophrenia (HCC)   . Hypokalemia   . Syncope 01/22/2020  . Chronic systolic CHF (congestive heart failure) (HCC) 01/22/2020  . AKI (acute kidney injury) (HCC) 01/22/2020  . Hyperlipidemia 01/22/2020  . COPD (chronic obstructive pulmonary disease) (HCC) 01/22/2020  . Microcytic anemia 01/22/2020  . Chronic hyponatremia 01/22/2020  . Seizure disorder (HCC) 01/22/2020    Past Surgical History:  Procedure Laterality Date  . KIDNEY STONE SURGERY         Family History  Problem Relation Age of Onset  . Heart disease Other     Social History   Tobacco Use  . Smoking status: Current Every Day Smoker    Packs/day: 1.00    Types: Cigarettes  . Smokeless  tobacco: Current User    Types: Chew  Vaping Use  . Vaping Use: Never used  Substance Use Topics  . Alcohol use: Not Currently  . Drug use: Never    Home Medications Prior to Admission medications   Medication Sig Start Date End Date Taking? Authorizing Provider  acetaminophen (TYLENOL) 325 MG tablet Take 325 mg by mouth.  02/08/20  Yes [provider]  albuterol (VENTOLIN HFA) 108 (90 Base) MCG/ACT inhaler Inhale 1-2 puffs into the lungs every 6 (six) hours as needed for wheezing or shortness of breath. 01/19/20  Yes Linwood Dibbles, MD  ALPRAZolam Prudy Feeler) 0.5 MG tablet Take 1 tablet (0.5 mg total) by mouth 2 (two) times daily as needed for anxiety. 01/27/20  Yes Swayze, Ava, DO  aspirin (BAYER ASPIRIN EC LOW DOSE) 81 MG EC tablet Take 81 mg by mouth daily.    Yes [provider]  atorvastatin (LIPITOR) 40 MG tablet Take 40 mg by mouth daily.  11/10/19  Yes [provider]  budesonide (PULMICORT) 0.5 MG/2ML nebulizer solution Take 2 mLs (0.5 mg total) by nebulization 2 (two) times daily. 01/27/20  Yes Swayze, Ava, DO  budesonide-formoterol (SYMBICORT) 160-4.5 MCG/ACT inhaler Inhale 2 puffs into the lungs 2 (two) times daily.   Yes [provider]  busPIRone (BUSPAR) 10 MG tablet Take 1 tablet (10 mg total) by mouth 3 (three) times daily.  01/27/20  Yes Swayze, Ava, DO  carvedilol (COREG) 3.125 MG tablet Take 1 tablet (3.125 mg total) by mouth 2 (two) times daily with a meal. 01/27/20  Yes Swayze, Ava, DO  digoxin (LANOXIN) 0.125 MG tablet Take 1 tablet (0.125 mg total) by mouth daily. 01/28/20  Yes Swayze, Ava, DO  gabapentin (NEURONTIN) 300 MG capsule Take 300 mg by mouth 3 (three) times daily. 01/16/20  Yes [provider]  INVEGA SUSTENNA 234 MG/1.5ML SUSY injection Inject 1.5 mLs into the muscle every 30 (thirty) days. 01/06/20  Yes [provider]  metolazone (ZAROXOLYN) 2.5 MG tablet Take 1 tablet (2.5 mg total) by mouth daily. 01/28/20  Yes Swayze,  Ava, DO  nicotine (NICODERM CQ - DOSED IN MG/24 HOURS) 21 mg/24hr patch Place 1 patch (21 mg total) onto the skin daily. 01/28/20  Yes Swayze, Ava, DO  phenytoin (DILANTIN) 100 MG ER capsule Take 1 capsule by mouth 3 (three) times daily. 11/10/19  Yes [provider]  potassium chloride (KLOR-CON) 10 MEQ tablet Take 10 mEq by mouth daily.   Yes [provider]  QUEtiapine (SEROQUEL) 300 MG tablet Take 300 mg by mouth daily.  01/12/20  Yes [provider]  sacubitril-valsartan (ENTRESTO) 24-26 MG Take 1 tablet by mouth 2 (two) times daily. 01/27/20  Yes Swayze, Ava, DO  torsemide (DEMADEX) 20 MG tablet Take 1 tablet (20 mg total) by mouth 2 (two) times daily. 01/27/20  Yes Swayze, Ava, DO  arformoterol (BROVANA) 15 MCG/2ML NEBU Take 2 mLs (15 mcg total) by nebulization 2 (two) times daily. 01/27/20   Swayze, Ava, DO    Allergies    Patient has no known allergies.  Review of Systems   Review of Systems  Constitutional: Negative for fever.  Respiratory: Negative for cough and shortness of breath.   Cardiovascular: Negative for chest pain.  Gastrointestinal: Negative for abdominal pain, diarrhea and vomiting.  Musculoskeletal: Negative for neck pain.  Neurological: Positive for weakness and numbness. Negative for headaches.  Psychiatric/Behavioral: Positive for confusion.  All other systems reviewed and are negative.   Physical Exam Updated Vital Signs BP 119/86   Pulse 92   Temp 98.1 F (36.7 C) (Oral)   Resp 15   SpO2 97%   Physical Exam Vitals and nursing note reviewed.  Constitutional:      General: He is not in acute distress.    Appearance: He is well-developed. He is not ill-appearing or diaphoretic.  HENT:     Head: Normocephalic and atraumatic.     Right Ear: External ear normal.     Left Ear: External ear normal.     Nose: Nose normal.  Eyes:     General:        Right eye: No discharge.        Left eye: No discharge.     Extraocular Movements:  Extraocular movements intact.     Pupils: Pupils are equal, round, and reactive to light.  Cardiovascular:     Rate and Rhythm: Normal rate and regular rhythm.     Heart sounds: Normal heart sounds.  Pulmonary:     Effort: Pulmonary effort is normal.     Breath sounds: Normal breath sounds.  Abdominal:     Palpations: Abdomen is soft.     Tenderness: There is no abdominal tenderness.  Musculoskeletal:     Cervical back: Neck supple.  Skin:    General: Skin is warm and dry.  Neurological:     Mental Status:  He is alert and oriented to person, place, and time.     Comments: CN 3-12 grossly intact. 5/5 strength in all 4 extremities. Grossly normal sensation. Normal finger to nose.   Psychiatric:        Mood and Affect: Mood is not anxious.     ED Results / Procedures / Treatments   Labs (all labs ordered are listed, but only abnormal results are displayed) Labs Reviewed  CBC - Abnormal; Notable for the following components:      Result Value   RBC 6.35 (*)    Hemoglobin 12.6 (*)    MCV 63.8 (*)    MCH 19.8 (*)    RDW 27.1 (*)    All other components within normal limits  COMPREHENSIVE METABOLIC PANEL - Abnormal; Notable for the following components:   Sodium 124 (*)    Potassium 2.8 (*)    Chloride 79 (*)    BUN 25 (*)    Alkaline Phosphatase 299 (*)    All other components within normal limits  URINALYSIS, ROUTINE W REFLEX MICROSCOPIC - Abnormal; Notable for the following components:   Color, Urine STRAW (*)    Specific Gravity, Urine 1.004 (*)    All other components within normal limits  MAGNESIUM - Abnormal; Notable for the following components:   Magnesium 1.5 (*)    All other components within normal limits  CBG MONITORING, ED - Abnormal; Notable for the following components:   Glucose-Capillary 102 (*)    All other components within normal limits  SARS CORONAVIRUS 2 BY RT PCR (HOSPITAL ORDER, PERFORMED IN [facility])  PROTIME-INR  APTT    DIFFERENTIAL  LACTIC ACID, PLASMA  BASIC METABOLIC PANEL  HEPATIC FUNCTION PANEL  MAGNESIUM  CBC  TSH  VITAMIN B12  SODIUM, URINE, RANDOM  I-STAT CHEM 8, ED  TROPONIN I (HIGH SENSITIVITY)  TROPONIN I (HIGH SENSITIVITY)    EKG EKG Interpretation  Date/Time:  Thursday February 19 2020 20:33:10 EDT Ventricular Rate:  98 PR Interval:  176 QRS Duration: 106 QT Interval:  384 QTC Calculation: 490 R Axis:   126 Text Interpretation: Normal sinus rhythm Biatrial enlargement Left posterior fascicular block ST & T wave abnormality, consider inferolateral ischemia Prolonged QT similar to Jan 21 2020 Confirmed by Pricilla Loveless [phone]) on 02/19/2020 9:04:06 PM   Radiology DG Chest 2 View  Result Date: 02/19/2020 CLINICAL DATA:  Weakness EXAM: CHEST - 2 VIEW COMPARISON:  01/22/2020 FINDINGS: Cardiomegaly. No confluent opacities, effusions or edema. No acute bony abnormality. IMPRESSION: Cardiomegaly.  No active disease. Electronically Signed   By: Charlett Nose M.D.   On: 02/19/2020 21:26   CT HEAD WO CONTRAST  Result Date: 02/19/2020 CLINICAL DATA:  Numbness in bilateral hands and feet. EXAM: CT HEAD WITHOUT CONTRAST TECHNIQUE: Contiguous axial images were obtained from the base of the skull through the vertex without intravenous contrast. COMPARISON:  None. FINDINGS: Brain: 5.8 cm cystic structure noted in the right posterior cerebral hemisphere compatible with arachnoid cyst. No hemorrhage, hydrocephalus, acute infarction or midline shift. Vascular: No hyperdense vessel or unexpected calcification. Skull: No acute calvarial abnormality. Sinuses/Orbits: Visualized paranasal sinuses and mastoids clear. Orbital soft tissues unremarkable. Other: None IMPRESSION: 5.8 cm posterior right arachnoid cyst. No acute intracranial abnormality. Electronically Signed   By: Charlett Nose M.D.   On: 02/19/2020 21:46    Procedures Procedures (including critical care time)  Medications Ordered in  ED Medications  sodium chloride flush (NS) 0.9 % injection 3 mL (  has no administration in time range)  potassium chloride 10 mEq in 100 mL IVPB (10 mEq Intravenous New Bag/Given 02/19/20 2308)  aspirin EC tablet 81 mg (has no administration in time range)  atorvastatin (LIPITOR) tablet 40 mg (has no administration in time range)  carvedilol (COREG) tablet 3.125 mg (has no administration in time range)  digoxin (LANOXIN) tablet 0.125 mg (has no administration in time range)  sacubitril-valsartan (ENTRESTO) 24-26 mg per tablet (has no administration in time range)  ALPRAZolam (XANAX) tablet 0.5 mg (has no administration in time range)  busPIRone (BUSPAR) tablet 10 mg (has no administration in time range)  nicotine (NICODERM CQ - dosed in mg/24 hours) patch 21 mg (has no administration in time range)  QUEtiapine (SEROQUEL) tablet 300 mg (has no administration in time range)  gabapentin (NEURONTIN) capsule 300 mg (has no administration in time range)  phenytoin (DILANTIN) ER capsule 100 mg (has no administration in time range)  potassium chloride (KLOR-CON) CR tablet 10 mEq (has no administration in time range)  albuterol (PROVENTIL) (2.5 MG/3ML) 0.083% nebulizer solution 2.5 mg (has no administration in time range)  mometasone-formoterol (DULERA) 200-5 MCG/ACT inhaler 2 puff (has no administration in time range)  enoxaparin (LOVENOX) injection 40 mg (has no administration in time range)  sodium chloride flush (NS) 0.9 % injection 3 mL (has no administration in time range)  sodium chloride flush (NS) 0.9 % injection 3 mL (has no administration in time range)  sodium chloride flush (NS) 0.9 % injection 3 mL (has no administration in time range)  0.9 %  sodium chloride infusion (has no administration in time range)  acetaminophen (TYLENOL) tablet 650 mg (has no administration in time range)    Or  acetaminophen (TYLENOL) suppository 650 mg (has no administration in time range)  senna-docusate  (Senokot-S) tablet 1 tablet (has no administration in time range)  ondansetron (ZOFRAN) tablet 4 mg (has no administration in time range)    Or  ondansetron (ZOFRAN) injection 4 mg (has no administration in time range)  magnesium sulfate IVPB 2 g 50 mL (has no administration in time range)  sodium chloride 0.9 % bolus 500 mL (0 mLs Intravenous Stopped 02/19/20 2309)  potassium chloride SA (KLOR-CON) CR tablet 40 mEq (40 mEq Oral Given 02/19/20 2304)    ED Course  I have reviewed the triage vital signs and the nursing notes.  Pertinent labs & imaging results that were available during my care of the patient were reviewed by me and considered in my medical decision making (see chart for details).    MDM Rules/Calculators/A&P                          Patient with generalized weakness.  Found to have recurrent hyponatremia.  He had been given a small IV fluid bolus given initial hypotension though this was not found on repeat blood pressures and so may not have been a true finding.  Infection seems unlikely.  He also has hypokalemia.  I discussed with Dr. Myna Hidalgo as I think he will need admission and work-up/supportive care for the hyponatremia and electrolyte disturbance.  Otherwise no focal neuro deficits. Final Clinical Impression(s) / ED Diagnoses Final diagnoses:  Hyponatremia  Hypokalemia    Rx / DC Orders ED Discharge Orders    None       Sherwood Gambler, MD 02/20/20 0007

## 2020-02-19 NOTE — H&P (Signed)
History and Physical    Khoen Genet UYQ:[id number] DOB: 07-17-1966 DOA: 02/19/2020  PCP: System, Pcp Not In   Patient coming from: Home   Chief Complaint: Fatigue, generalized weakness, numbness in bilateral hands and feet   HPI: Jim Dunn is a 54 y.o. male with medical history significant for paranoid schizophrenia, COPD, chronic systolic CHF, and seizures, now presenting to the emergency department with some vague complaints that include generalized weakness and fatigue, as well as numbness in the bilateral hands and feet.  Unfortunately, the patient is a poor historian.  He has been experiencing these symptoms for weeks to a month now, had been in an SNF after hospital discharge late last month, reports being back at home with his mother for the past week, and has grown more weak in general and more fatigued in the past few days but began to feel this way closer to a month ago.  The bilateral hands and feet numbness has also been present for weeks to a month and seems to be intermittent.  He denies headache or focal weakness.  He denies any fall or trauma.  He denies fevers, chills, or chest pain.  He denies any lower extremity swelling at this time but notes that he had swollen legs when he was recently admitted.  Per report of ED personnel, patient's mother advised the patient to go to the ED for possible dehydration.  ED Course: Upon arrival to the ED, patient is found to be afebrile, saturating well on room air, and with initial blood pressure 77/67.  EKG features sinus rhythm with LPF B and ST-T abnormalities that appear similar to prior.  Chest x-rays negative for acute cardiopulmonary disease.  CT head negative for acute findings but notable for a arachnoid cyst.  Chemistry panel features potassium 2.8 and sodium 124, down from 132 at time of recent discharge.  CBC with chronic microcytosis.  Patient was given 500 cc of saline, as well as oral and IV potassium in the ED.  COVID-19 screening  test is pending.  Review of Systems:  All other systems reviewed and apart from HPI, are negative.  Past Medical History:  Diagnosis Date  . CHF (congestive heart failure) (HCC)   . COPD (chronic obstructive pulmonary disease) (HCC)   . History of kidney stones   . Paranoid schizophrenia (HCC)   . Seizures (HCC)   . Tobacco abuse     Past Surgical History:  Procedure Laterality Date  . KIDNEY STONE SURGERY       reports that he has been smoking cigarettes. He has been smoking about 1.00 pack per day. His smokeless tobacco use includes chew. He reports previous alcohol use. He reports that he does not use drugs.  No Known Allergies  Family History  Problem Relation Age of Onset  . Heart disease Other      Prior to Admission medications   Medication Sig Start Date End Date Taking? Authorizing Provider  acetaminophen (TYLENOL) 325 MG tablet Take 325 mg by mouth.  02/08/20  Yes [provider]  albuterol (VENTOLIN HFA) 108 (90 Base) MCG/ACT inhaler Inhale 1-2 puffs into the lungs every 6 (six) hours as needed for wheezing or shortness of breath. 01/19/20  Yes Linwood Dibbles, MD  ALPRAZolam Prudy Feeler) 0.5 MG tablet Take 1 tablet (0.5 mg total) by mouth 2 (two) times daily as needed for anxiety. 01/27/20  Yes Swayze, Ava, DO  aspirin (BAYER ASPIRIN EC LOW DOSE) 81 MG EC tablet Take 81 mg by  mouth daily.    Yes [provider]  atorvastatin (LIPITOR) 40 MG tablet Take 40 mg by mouth daily.  11/10/19  Yes [provider]  budesonide (PULMICORT) 0.5 MG/2ML nebulizer solution Take 2 mLs (0.5 mg total) by nebulization 2 (two) times daily. 01/27/20  Yes Swayze, Ava, DO  budesonide-formoterol (SYMBICORT) 160-4.5 MCG/ACT inhaler Inhale 2 puffs into the lungs 2 (two) times daily.   Yes [provider]  busPIRone (BUSPAR) 10 MG tablet Take 1 tablet (10 mg total) by mouth 3 (three) times daily. 01/27/20  Yes Swayze, Ava, DO  carvedilol (COREG) 3.125 MG tablet Take 1 tablet  (3.125 mg total) by mouth 2 (two) times daily with a meal. 01/27/20  Yes Swayze, Ava, DO  digoxin (LANOXIN) 0.125 MG tablet Take 1 tablet (0.125 mg total) by mouth daily. 01/28/20  Yes Swayze, Ava, DO  gabapentin (NEURONTIN) 300 MG capsule Take 300 mg by mouth 3 (three) times daily. 01/16/20  Yes [provider]  INVEGA SUSTENNA 234 MG/1.5ML SUSY injection Inject 1.5 mLs into the muscle every 30 (thirty) days. 01/06/20  Yes [provider]  metolazone (ZAROXOLYN) 2.5 MG tablet Take 1 tablet (2.5 mg total) by mouth daily. 01/28/20  Yes Swayze, Ava, DO  nicotine (NICODERM CQ - DOSED IN MG/24 HOURS) 21 mg/24hr patch Place 1 patch (21 mg total) onto the skin daily. 01/28/20  Yes Swayze, Ava, DO  phenytoin (DILANTIN) 100 MG ER capsule Take 1 capsule by mouth 3 (three) times daily. 11/10/19  Yes [provider]  potassium chloride (KLOR-CON) 10 MEQ tablet Take 10 mEq by mouth daily.   Yes [provider]  QUEtiapine (SEROQUEL) 300 MG tablet Take 300 mg by mouth daily.  01/12/20  Yes [provider]  sacubitril-valsartan (ENTRESTO) 24-26 MG Take 1 tablet by mouth 2 (two) times daily. 01/27/20  Yes Swayze, Ava, DO  torsemide (DEMADEX) 20 MG tablet Take 1 tablet (20 mg total) by mouth 2 (two) times daily. 01/27/20  Yes Swayze, Ava, DO  arformoterol (BROVANA) 15 MCG/2ML NEBU Take 2 mLs (15 mcg total) by nebulization 2 (two) times daily. 01/27/20   Swayze, Ava, DO    Physical Exam: Vitals:   02/19/20 2055 02/19/20 2206 02/19/20 2215 02/19/20 2230  BP:  115/79 115/76 121/77  Pulse: 94 89 86 84  Resp: 14     Temp:      TempSrc:      SpO2: 98% 98% 100% 98%    Constitutional: NAD, calm  Eyes: PERTLA, lids and conjunctivae normal ENMT: Mucous membranes are moist. Posterior pharynx clear of any exudate or lesions.   Neck: normal, supple, no masses, no thyromegaly Respiratory:  no wheezing, no crackles. No accessory muscle use.  Cardiovascular: S1 & S2 heard, regular rate  and rhythm. No extremity edema.  Abdomen: No distension, no tenderness, soft. Bowel sounds active.  Musculoskeletal: no clubbing / cyanosis. No joint deformity upper and lower extremities.   Skin: no significant rashes, lesions, ulcers. Warm, dry, well-perfused. Neurologic: CN 2-12 grossly intact. Sensation to light touch diminished in distal extremities x4. Strength 5/5 in all 4 limbs.  Psychiatric: Alert and oriented to person, place, and situation. Pleasant and cooperative.    Labs and Imaging on Admission: I have personally reviewed following labs and imaging studies  CBC: Recent Labs  Lab 02/19/20 2058  WBC 8.7  NEUTROABS 4.1  HGB 12.6*  HCT 40.5  MCV 63.8*  PLT 287   Basic Metabolic Panel: Recent Labs  Lab 02/19/20  2058  NA 124*  K 2.8*  CL 79*  CO2 31  GLUCOSE 94  BUN 25*  CREATININE 1.05  CALCIUM 9.2   GFR: CrCl cannot be calculated (Unknown ideal weight.). Liver Function Tests: Recent Labs  Lab 02/19/20 2058  AST 33  ALT 24  ALKPHOS 299*  BILITOT 1.0  PROT 7.5  ALBUMIN 3.5   No results for input(s): LIPASE, AMYLASE in the last 168 hours. No results for input(s): AMMONIA in the last 168 hours. Coagulation Profile: Recent Labs  Lab 02/19/20 2058  INR 1.1   Cardiac Enzymes: No results for input(s): CKTOTAL, CKMB, CKMBINDEX, TROPONINI in the last 168 hours. BNP (last 3 results) No results for input(s): PROBNP in the last 8760 hours. HbA1C: No results for input(s): HGBA1C in the last 72 hours. CBG: Recent Labs  Lab 02/19/20 2100  GLUCAP 102*   Lipid Profile: No results for input(s): CHOL, HDL, LDLCALC, TRIG, CHOLHDL, LDLDIRECT in the last 72 hours. Thyroid Function Tests: No results for input(s): TSH, T4TOTAL, FREET4, T3FREE, THYROIDAB in the last 72 hours. Anemia Panel: No results for input(s): VITAMINB12, FOLATE, FERRITIN, TIBC, IRON, RETICCTPCT in the last 72 hours. Urine analysis:    Component Value Date/Time   COLORURINE STRAW (A)  02/19/2020 2206   APPEARANCEUR CLEAR 02/19/2020 2206   LABSPEC 1.004 (L) 02/19/2020 2206   PHURINE 7.0 02/19/2020 2206   GLUCOSEU NEGATIVE 02/19/2020 2206   HGBUR NEGATIVE 02/19/2020 2206   BILIRUBINUR NEGATIVE 02/19/2020 2206   [place] 02/19/2020 2206   PROTEINUR NEGATIVE 02/19/2020 2206   NITRITE NEGATIVE 02/19/2020 2206   LEUKOCYTESUR NEGATIVE 02/19/2020 2206   Sepsis Labs: @LABRCNTIP (procalcitonin:4,lacticidven:4) )No results found for this or any previous visit (from the past 240 hour(s)).   Radiological Exams on Admission: DG Chest 2 View  Result Date: 02/19/2020 CLINICAL DATA:  Weakness EXAM: CHEST - 2 VIEW COMPARISON:  01/22/2020 FINDINGS: Cardiomegaly. No confluent opacities, effusions or edema. No acute bony abnormality. IMPRESSION: Cardiomegaly.  No active disease. Electronically Signed   By: Rolm Baptise M.D.   On: 02/19/2020 21:26   CT HEAD WO CONTRAST  Result Date: 02/19/2020 CLINICAL DATA:  Numbness in bilateral hands and feet. EXAM: CT HEAD WITHOUT CONTRAST TECHNIQUE: Contiguous axial images were obtained from the base of the skull through the vertex without intravenous contrast. COMPARISON:  None. FINDINGS: Brain: 5.8 cm cystic structure noted in the right posterior cerebral hemisphere compatible with arachnoid cyst. No hemorrhage, hydrocephalus, acute infarction or midline shift. Vascular: No hyperdense vessel or unexpected calcification. Skull: No acute calvarial abnormality. Sinuses/Orbits: Visualized paranasal sinuses and mastoids clear. Orbital soft tissues unremarkable. Other: None IMPRESSION: 5.8 cm posterior right arachnoid cyst. No acute intracranial abnormality. Electronically Signed   By: Rolm Baptise M.D.   On: 02/19/2020 21:46    EKG: Independently reviewed. Sinus rhythm, LPFB, ST-T abnormality, similar to prior.    Assessment/Plan   1. Hyponatremia  - Presents with malaise and fatigue worsening over weeks to a month and is found to have serum  sodium of 124, down from 132 late last month  - He appears hypovolemic and was hypotensive in ED initially, given a 500 cc NS bolus  - Check urine sodium, hold diuretics, restrict free-water, repeat chem panel in am   2. Chronic systolic CHF  - Appears compensated  - EF was 25-30% on echo from May 2021  - He had a low BP reading in ED and was given a 500 cc NS bolus  - Hold diuretics initially,  continue Coreg and Entresto with holding parameters for BP, monitor daily weight and I/Os   3. Schizophrenia  - Reports recent visual hallucinations and frequent SI over the last 18 years without any plan or attempts, denies current SI, denies HI, and denies any recent auditory hallucination  - He received [place] last week per pharmacy, continue Seroquel, Buspar, and as-needed Xanax    4. Hypokalemia  - Serum potassium is 2.8 in ED  - Treated with oral and IV potassium in ED  - Mag level pending  - Continue cardiac monitoring, follow-up mag level and repeat chem panel in am   5. COPD  - No cough or wheezing  - Continue ICS/LABA and as-needed albuterol    6. Neuropathy  - Reports numbness in bilateral hands and feet for weeks to a month without weakness or headache  - HIV was negative last month, will check TSH and B12   7. Seizures  - Continue gabapentin and Dilantin     DVT prophylaxis: Lovenox  Code Status: Full  Family Communication: Discussed with patient  Disposition Plan:  Patient is from: Home  Anticipated d/c is to: TBD  Anticipated d/c date is: 02/20/20 Patient currently: Pending additional lab workup   Consults called: none  Admission status: Observation    Briscoe Deutscher, MD [facility] Pager: See [email or web address]  If 7AM-7PM, please contact the daytime attending [email or web address]  02/19/2020, 11:44 PM

## 2020-02-20 ENCOUNTER — Other Ambulatory Visit: Payer: Self-pay

## 2020-02-20 DIAGNOSIS — Z7951 Long term (current) use of inhaled steroids: Secondary | ICD-10-CM | POA: Diagnosis not present

## 2020-02-20 DIAGNOSIS — F17210 Nicotine dependence, cigarettes, uncomplicated: Secondary | ICD-10-CM | POA: Diagnosis present

## 2020-02-20 DIAGNOSIS — Z20822 Contact with and (suspected) exposure to covid-19: Secondary | ICD-10-CM | POA: Diagnosis present

## 2020-02-20 DIAGNOSIS — Z79899 Other long term (current) drug therapy: Secondary | ICD-10-CM | POA: Diagnosis not present

## 2020-02-20 DIAGNOSIS — I428 Other cardiomyopathies: Secondary | ICD-10-CM | POA: Diagnosis present

## 2020-02-20 DIAGNOSIS — G40909 Epilepsy, unspecified, not intractable, without status epilepticus: Secondary | ICD-10-CM | POA: Diagnosis present

## 2020-02-20 DIAGNOSIS — Z5329 Procedure and treatment not carried out because of patient's decision for other reasons: Secondary | ICD-10-CM | POA: Diagnosis not present

## 2020-02-20 DIAGNOSIS — G629 Polyneuropathy, unspecified: Secondary | ICD-10-CM | POA: Diagnosis present

## 2020-02-20 DIAGNOSIS — E876 Hypokalemia: Secondary | ICD-10-CM | POA: Diagnosis present

## 2020-02-20 DIAGNOSIS — E871 Hypo-osmolality and hyponatremia: Secondary | ICD-10-CM | POA: Diagnosis present

## 2020-02-20 DIAGNOSIS — E860 Dehydration: Secondary | ICD-10-CM | POA: Diagnosis present

## 2020-02-20 DIAGNOSIS — Z7289 Other problems related to lifestyle: Secondary | ICD-10-CM | POA: Diagnosis not present

## 2020-02-20 DIAGNOSIS — I445 Left posterior fascicular block: Secondary | ICD-10-CM | POA: Diagnosis present

## 2020-02-20 DIAGNOSIS — E785 Hyperlipidemia, unspecified: Secondary | ICD-10-CM | POA: Diagnosis present

## 2020-02-20 DIAGNOSIS — F200 Paranoid schizophrenia: Secondary | ICD-10-CM | POA: Diagnosis present

## 2020-02-20 DIAGNOSIS — E861 Hypovolemia: Secondary | ICD-10-CM | POA: Diagnosis present

## 2020-02-20 DIAGNOSIS — Z87442 Personal history of urinary calculi: Secondary | ICD-10-CM | POA: Diagnosis not present

## 2020-02-20 DIAGNOSIS — I5022 Chronic systolic (congestive) heart failure: Secondary | ICD-10-CM | POA: Diagnosis present

## 2020-02-20 DIAGNOSIS — J449 Chronic obstructive pulmonary disease, unspecified: Secondary | ICD-10-CM | POA: Diagnosis present

## 2020-02-20 DIAGNOSIS — Z7982 Long term (current) use of aspirin: Secondary | ICD-10-CM | POA: Diagnosis not present

## 2020-02-20 DIAGNOSIS — G930 Cerebral cysts: Secondary | ICD-10-CM | POA: Diagnosis present

## 2020-02-20 LAB — BASIC METABOLIC PANEL
Anion gap: 15 (ref 5–15)
BUN: 27 mg/dL — ABNORMAL HIGH (ref 6–20)
CO2: 29 mmol/L (ref 22–32)
Calcium: 9.1 mg/dL (ref 8.9–10.3)
Chloride: 84 mmol/L — ABNORMAL LOW (ref 98–111)
Creatinine, Ser: 0.78 mg/dL (ref 0.61–1.24)
GFR calc Af Amer: >60 mL/min (ref >60)
GFR calc non Af Amer: >60 mL/min (ref >60)
Glucose, Bld: 85 mg/dL (ref 70–99)
Potassium: 2.9 mmol/L — ABNORMAL LOW (ref 3.5–5.1)
Sodium: 128 mmol/L — ABNORMAL LOW (ref 135–145)

## 2020-02-20 LAB — VITAMIN B12: Vitamin B-12: 679 pg/mL (ref 180–914)

## 2020-02-20 LAB — CBC
HCT: 39.7 % (ref 39.0–52.0)
Hemoglobin: 12.7 g/dL — ABNORMAL LOW (ref 13.0–17.0)
MCH: 20 pg — ABNORMAL LOW (ref 26.0–34.0)
MCHC: 32 g/dL (ref 30.0–36.0)
MCV: 62.6 fL — ABNORMAL LOW (ref 80.0–100.0)
Platelets: 280 10*3/uL (ref 150–400)
RBC: 6.34 MIL/uL — ABNORMAL HIGH (ref 4.22–5.81)
RDW: 26.9 % — ABNORMAL HIGH (ref 11.5–15.5)
WBC: 7.8 10*3/uL (ref 4.0–10.5)
nRBC: 0 % (ref 0.0–0.2)

## 2020-02-20 LAB — HEPATIC FUNCTION PANEL
ALT: 22 U/L (ref 0–44)
AST: 29 U/L (ref 15–41)
Albumin: 3.5 g/dL (ref 3.5–5.0)
Alkaline Phosphatase: 289 U/L — ABNORMAL HIGH (ref 38–126)
Bilirubin, Direct: 0.3 mg/dL — ABNORMAL HIGH (ref 0.0–0.2)
Indirect Bilirubin: 0.6 mg/dL (ref 0.3–0.9)
Total Bilirubin: 0.9 mg/dL (ref 0.3–1.2)
Total Protein: 7.3 g/dL (ref 6.5–8.1)

## 2020-02-20 LAB — TSH: TSH: 0.437 u[IU]/mL (ref 0.350–4.500)

## 2020-02-20 LAB — TROPONIN I (HIGH SENSITIVITY): Troponin I (High Sensitivity): 9 ng/L (ref <18)

## 2020-02-20 LAB — MAGNESIUM: Magnesium: 1.5 mg/dL — ABNORMAL LOW (ref 1.7–2.4)

## 2020-02-20 LAB — SARS CORONAVIRUS 2 BY RT PCR (HOSPITAL ORDER, PERFORMED IN ~~LOC~~ HOSPITAL LAB): SARS Coronavirus 2: NEGATIVE

## 2020-02-20 MED ORDER — SPIRONOLACTONE 12.5 MG HALF TABLET
12.5000 mg | ORAL_TABLET | Freq: Every day | ORAL | Status: DC
  Administered 2020-02-20: 12.5 mg via ORAL
  Filled 2020-02-20 (×2): qty 1

## 2020-02-20 MED ORDER — MAGNESIUM SULFATE 4 GM/100ML IV SOLN
4.0000 g | Freq: Once | INTRAVENOUS | Status: AC
  Administered 2020-02-20: 4 g via INTRAVENOUS
  Filled 2020-02-20: qty 100

## 2020-02-20 MED ORDER — POTASSIUM CHLORIDE CRYS ER 20 MEQ PO TBCR
40.0000 meq | EXTENDED_RELEASE_TABLET | ORAL | Status: AC
  Administered 2020-02-20 (×2): 40 meq via ORAL
  Filled 2020-02-20 (×2): qty 2

## 2020-02-20 NOTE — Consult Note (Addendum)
Advanced Heart Failure Team Consult Note   Primary Physician: System, Pcp Not In PCP-Cardiologist:  No primary care provider on file.  Reason for Consultation:  Chronic Systolic HF HPI:    Jim [place] is seen today for evaluation of  Chronic systolic HF at the request of Dr. Jerral Ralph.   Jim [place] is a 54 year old with a medical history significant for paranoid schizophrenia, COPD, chronic systolic CHF, smoker, former ETOH abuse, and seizures.  He has had cath in the past per Dr Sharyn Lull with no coronary disease.   On 01/19/2020 he presented to the ED for refill requests. He only had labs that showed anemia and hyponatremia. Hyponatremia has been a chronic issue. He was given refill for metoprolol, lisinopril/HCTZ and discharged home.   Admitted 01/21/2020 after syncopal episode. ECHO completed and EF 25-30%. Cardiology consulted--> Dr Precious Reel. [name] Discharged to SNF on metolazone 2.5 mg daily + torsemide  20 mg twice a day. Of note he had not not been on torsemide or metolazone in the past.   Presented to the ED with  generalized weakness and fatigue. This had been ongoing for weeks,  o a month. In the ED he was hypotensive and was given fluid bolus. Sodium was down to 124 so diuretics held. Chest x-rays was negative for acute cardiopulmonary disease. CT head negative for acute findings but notable for a arachnoid cyst.    Today he is feeling some better.   Echo on 01/23/2020:EF 25-30% RV moderately enlarged.  Review of Systems: [y] = yes, [ ]  = no   . General: Weight gain [ ] ; Weight loss [ ] ; Anorexia [ ] ; Fatigue [ Y]; Fever [ ] ; Chills [ ] ; Weakness [Y ]  . Cardiac: Chest pain/pressure [ ] ; Resting SOB [ ] ; Exertional SOB [ ] ; Orthopnea [ ] ; Pedal Edema [ ] ; Palpitations [ ] ; Syncope [ ] ; Presyncope [ ] ; Paroxysmal nocturnal dyspnea[ ]   . Pulmonary: Cough [ ] ; Wheezing[ ] ; Hemoptysis[ ] ; Sputum [ ] ; Snoring [ ]   . GI: Vomiting[ ] ; Dysphagia[ ] ; Melena[ ] ; Hematochezia [ ] ; Heartburn[ ] ;  Abdominal pain [ ] ; Constipation [ ] ; Diarrhea [ ] ; BRBPR [ ]   . GU: Hematuria[ ] ; Dysuria [ ] ; Nocturia[ ]   . Vascular: Pain in legs with walking [ x]; Pain in feet with lying flat [ ] ; Non-healing sores [ ] ; Stroke [ ] ; TIA [ ] ; Slurred speech [ ] ;  . Neuro: Headaches[ ] ; Vertigo[ ] ; Seizures[ ] ; Paresthesias[ ] ;Blurred vision [ ] ; Diplopia [ ] ; Vision changes [ ]   . Ortho/Skin: Arthritis [ ] ; Joint pain [ ] ; Muscle pain [ ] ; Joint swelling [ ] ; Back Pain [Y ]; Rash [ ]   . Psych: Depression[ Y]; Anxiety[ ]   . Heme: Bleeding problems [ ] ; Clotting disorders [ ] ; Anemia [ ]   . Endocrine: Diabetes [ ] ; Thyroid dysfunction[ ]   Home Medications Prior to Admission medications   Medication Sig Start Date End Date Taking? Authorizing Provider  acetaminophen (TYLENOL) 325 MG tablet Take 325 mg by mouth.  02/08/20  Yes [provider]  albuterol (VENTOLIN HFA) 108 (90 Base) MCG/ACT inhaler Inhale 1-2 puffs into the lungs every 6 (six) hours as needed for wheezing or shortness of breath. 01/19/20  Yes , MD  ALPRAZolam ) 0.5 MG tablet Take 1 tablet (0.5 mg total) by mouth 2 (two) times daily as needed for anxiety. 01/27/20  Yes Swayze, Ava, DO  aspirin (BAYER ASPIRIN EC LOW DOSE) 81 MG  EC tablet Take 81 mg by mouth daily.    Yes [provider]  atorvastatin (LIPITOR) 40 MG tablet Take 40 mg by mouth daily.  11/10/19  Yes [provider]  budesonide (PULMICORT) 0.5 MG/2ML nebulizer solution Take 2 mLs (0.5 mg total) by nebulization 2 (two) times daily. 01/27/20  Yes Swayze, Ava, DO  budesonide-formoterol (SYMBICORT) 160-4.5 MCG/ACT inhaler Inhale 2 puffs into the lungs 2 (two) times daily.   Yes [provider]  busPIRone (BUSPAR) 10 MG tablet Take 1 tablet (10 mg total) by mouth 3 (three) times daily. 01/27/20  Yes Swayze, Ava, DO  carvedilol (COREG) 3.125 MG tablet Take 1 tablet (3.125 mg total) by mouth 2 (two) times daily with a meal. 01/27/20  Yes Swayze,  Ava, DO  digoxin (LANOXIN) 0.125 MG tablet Take 1 tablet (0.125 mg total) by mouth daily. 01/28/20  Yes Swayze, Ava, DO  gabapentin (NEURONTIN) 300 MG capsule Take 300 mg by mouth 3 (three) times daily. 01/16/20  Yes [provider]  INVEGA SUSTENNA 234 MG/1.5ML SUSY injection Inject 1.5 mLs into the muscle every 30 (thirty) days. 01/06/20  Yes [provider]  metolazone (ZAROXOLYN) 2.5 MG tablet Take 1 tablet (2.5 mg total) by mouth daily. 01/28/20  Yes Swayze, Ava, DO  nicotine (NICODERM CQ - DOSED IN MG/24 HOURS) 21 mg/24hr patch Place 1 patch (21 mg total) onto the skin daily. 01/28/20  Yes Swayze, Ava, DO  phenytoin (DILANTIN) 100 MG ER capsule Take 1 capsule by mouth 3 (three) times daily. 11/10/19  Yes [provider]  potassium chloride (KLOR-CON) 10 MEQ tablet Take 10 mEq by mouth daily.   Yes [provider]  QUEtiapine (SEROQUEL) 300 MG tablet Take 300 mg by mouth daily.  01/12/20  Yes [provider]  sacubitril-valsartan (ENTRESTO) 24-26 MG Take 1 tablet by mouth 2 (two) times daily. 01/27/20  Yes Swayze, Ava, DO  torsemide (DEMADEX) 20 MG tablet Take 1 tablet (20 mg total) by mouth 2 (two) times daily. 01/27/20  Yes Swayze, Ava, DO  arformoterol (BROVANA) 15 MCG/2ML NEBU Take 2 mLs (15 mcg total) by nebulization 2 (two) times daily. 01/27/20   Swayze, Ava, DO    Past Medical History: Past Medical History:  Diagnosis Date  . CHF (congestive heart failure) (HCC)   . COPD (chronic obstructive pulmonary disease) (HCC)   . History of kidney stones   . Paranoid schizophrenia (HCC)   . Seizures (HCC)   . Tobacco abuse     Past Surgical History: Past Surgical History:  Procedure Laterality Date  . KIDNEY STONE SURGERY      Family History: Family History  Problem Relation Age of Onset  . Heart disease Other     Social History: Social History   Socioeconomic History  . Marital status: Divorced    Spouse name: Not on file  . Number of  children: Not on file  . Years of education: Not on file  . Highest education level: Not on file  Occupational History  . Not on file  Tobacco Use  . Smoking status: Current Every Day Smoker    Packs/day: 1.00    Types: Cigarettes  . Smokeless tobacco: Current User    Types: Chew  Vaping Use  . Vaping Use: Never used  Substance and Sexual Activity  . Alcohol use: Not Currently  . Drug use: Never  . Sexual activity: Not on file  Other Topics Concern  . Not on file  Social History Narrative  .  Not on file   Social Determinants of Health   Financial Resource Strain:   . Difficulty of Paying Living Expenses:   Food Insecurity:   . Worried About Programme researcher, broadcasting/film/video in the Last Year:   . Barista in the Last Year:   Transportation Needs:   . Freight forwarder (Medical):   [name] Lack of Transportation (Non-Medical):   Physical Activity:   . Days of Exercise per Week:   . Minutes of Exercise per Session:   Stress:   . Feeling of Stress :   Social Connections:   . Frequency of Communication with Friends and Family:   . Frequency of Social Gatherings with Friends and Family:   . Attends Religious Services:   . Active Member of Clubs or Organizations:   . Attends Banker Meetings:   [name] Marital Status:     Allergies:  No Known Allergies  Objective:    Vital Signs:   Temp:  [97.9 F (36.6 C)-98.5 F (36.9 C)] 98.1 F (36.7 C) (06/18 1302) Pulse Rate:  [84-113] 96 (06/18 1302) Resp:  [12-18] 18 (06/18 1302) BP: (77-144)/(58-109) 114/83 (06/18 1302) SpO2:  [92 %-100 %] 92 % (06/18 1302) Weight:  [82.7 kg] 82.7 kg (06/18 0037) Last BM Date: 02/19/20  Weight change: Filed Weights   02/20/20 0037  Weight: 82.7 kg    Intake/Output:   Intake/Output Summary (Last 24 hours) at 02/20/2020 1409 Last data filed at 02/20/2020 0757 Gross per 24 hour  Intake 716 ml  Output 1100 ml  Net -384 ml      Physical Exam    General:  Well appearing. No  resp difficulty HEENT: normal Neck: supple. JVP normal . Carotids 2+ bilat; no bruits. No lymphadenopathy or thyromegaly appreciated. Cor: PMI nondisplaced. Regular rate & rhythm. No rubs, gallops or murmurs. Lungs: clear Abdomen: soft, nontender, nondistended. No hepatosplenomegaly. No bruits or masses. Good bowel sounds. Extremities: no cyanosis, clubbing, rash, edema Neuro: alert & orientedx3, cranial nerves grossly intact. moves all 4 extremities w/o difficulty. Affect pleasant   Telemetry   SR-ST 90-100s   EKG    Normal sinus rhythm Biatrial enlargement Left posterior fascicular block ST & T wave abnormality, consider inferolateral ischemia Prolonged QT similar to Jan 21 2020   Labs   Basic Metabolic Panel: Recent Labs  Lab 02/19/20 2058 02/19/20 2247 02/20/20 0457  NA 124*  --  128*  K 2.8*  --  2.9*  CL 79*  --  84*  CO2 31  --  29  GLUCOSE 94  --  85  BUN 25*  --  27*  CREATININE 1.05  --  0.78  CALCIUM 9.2  --  9.1  MG  --  1.5* 1.5*    Liver Function Tests: Recent Labs  Lab 02/19/20 2058 02/20/20 0457  AST 33 29  ALT 24 22  ALKPHOS 299* 289*  BILITOT 1.0 0.9  PROT 7.5 7.3  ALBUMIN 3.5 3.5   No results for input(s): LIPASE, AMYLASE in the last 168 hours. No results for input(s): AMMONIA in the last 168 hours.  CBC: Recent Labs  Lab 02/19/20 2058 02/20/20 0457  WBC 8.7 7.8  NEUTROABS 4.1  --   HGB 12.6* 12.7*  HCT 40.5 39.7  MCV 63.8* 62.6*  PLT 287 280    Cardiac Enzymes: No results for input(s): CKTOTAL, CKMB, CKMBINDEX, TROPONINI in the last 168 hours.  BNP: BNP (last 3 results) Recent Labs  01/22/20 0410  BNP 697.9*    ProBNP (last 3 results) No results for input(s): PROBNP in the last 8760 hours.   CBG: Recent Labs  Lab 02/19/20 2100  GLUCAP 102*    Coagulation Studies: Recent Labs    02/19/20 2058  LABPROT 13.6  INR 1.1     Imaging   DG Chest 2 View  Result Date: 02/19/2020 CLINICAL DATA:  Weakness  EXAM: CHEST - 2 VIEW COMPARISON:  01/22/2020 FINDINGS: Cardiomegaly. No confluent opacities, effusions or edema. No acute bony abnormality. IMPRESSION: Cardiomegaly.  No active disease. Electronically Signed   By: Rolm Baptise M.D.   On: 02/19/2020 21:26   CT HEAD WO CONTRAST  Result Date: 02/19/2020 CLINICAL DATA:  Numbness in bilateral hands and feet. EXAM: CT HEAD WITHOUT CONTRAST TECHNIQUE: Contiguous axial images were obtained from the base of the skull through the vertex without intravenous contrast. COMPARISON:  None. FINDINGS: Brain: 5.8 cm cystic structure noted in the right posterior cerebral hemisphere compatible with arachnoid cyst. No hemorrhage, hydrocephalus, acute infarction or midline shift. Vascular: No hyperdense vessel or unexpected calcification. Skull: No acute calvarial abnormality. Sinuses/Orbits: Visualized paranasal sinuses and mastoids clear. Orbital soft tissues unremarkable. Other: None IMPRESSION: 5.8 cm posterior right arachnoid cyst. No acute intracranial abnormality. Electronically Signed   By: Rolm Baptise M.D.   On: 02/19/2020 21:46      Medications:     Current Medications: . aspirin EC  81 mg Oral Daily  . atorvastatin  40 mg Oral Daily  . busPIRone  10 mg Oral TID  . carvedilol  3.125 mg Oral BID WC  . digoxin  0.125 mg Oral Daily  . gabapentin  300 mg Oral TID  . mometasone-formoterol  2 puff Inhalation BID  . nicotine  21 mg Transdermal Daily  . phenytoin  100 mg Oral TID  . potassium chloride  40 mEq Oral Q4H  . QUEtiapine  300 mg Oral Daily  . sodium chloride flush  3 mL Intravenous Once  . sodium chloride flush  3 mL Intravenous Q12H  . sodium chloride flush  3 mL Intravenous Q12H  . spironolactone  12.5 mg Oral Daily     Infusions: . sodium chloride    . magnesium sulfate bolus IVPB         Assessment/Plan   1. Chronic Systolic HF, presumed NICM. Has had cath in past per Dr Terrence Dupont but we do not have recordx.   -brother passed from  HF @ 64. Pt w/ hx of drugs and ETOH   - LVEF 25-30% from May 2021- ? Non-compaction . In the past he was offered ICD not sure what happened.  - Volume status stable. Suspect he was over diuresed with new script for torsemide and metolazone.  - For now place on torsemide 20 mg daily.  - Keep off metolazone.  - Continue carvedilol 3.125 mg twice a  Day  -Increase spironolactone to 25 mg daily - continue digoxin .125mg  daily  - Given neuropathy he could have amyloid. Eventually would get PYP/CMRI  2. Chronic Hyponatremia  - hx in 2015 w/ NA of 132 and in 2019 Na was 129 -admit sodium 128 , 500 cc NS bolus given in ED - Keep off metolazone   3.Hypokalemia  -admit K 2.8, supplemented in ED -Potassium remains low. Supplement K.  - Increase spiro to 25 mg daily - Check BMET in am.   4. COPD -continue inhalers.    5 Schizophrenia -denies SI, HI or  any auditory or visual hallucinations  -Per primary team. Continue Buspar and PRN Xanax   6. Neuropathy  -admission complaint of bilateral hand and feet numbness -TSH .437, B12 679 both WNL - continue gabapentin 300 MG daily   7. Seizures  -continue dilantin 100 mg ER  -continue gabapentin 300mg   HF meds for dc -Torsemide 20 mg daily start 02/21/20 - K 40 meq daily.  -Spironolactone 25 mg daily - Digoxin 0.125 mg daily - Coreg 3.125 mg twice a day   Lives in [place] will refer to HF Paramedicine.   Length of Stay: 0 Amy Clegg NP-C  5:28 PM   Advanced Heart Failure Team Pager [phone] (M-F; 7a - 4p)  Please contact [facility] for night-coverage after hours (4p -7a ) and weekends on [email or web address]  Patient seen and examined with the above-signed Advanced Practice Provider and/or Housestaff. I personally reviewed laboratory data, imaging studies and relevant notes. I independently examined the patient and formulated the important aspects of the plan. I have edited the note to reflect any of my changes or salient points. I have  personally discussed the plan with the patient and/or family.  54 y/o male as above with NICM previous followed in [place], 46 and then by Dr. [place]. EF 25%  Has h/o hyponatremia. Admitted with volume depletion and worsening hyponatremia in setting of daily metolazone use. Improved with hydration   General:  Sitting up No resp difficulty HEENT: normal Neck: supple. no JVD. Carotids 2+ bilat; no bruits. No lymphadenopathy or thryomegaly appreciated. Cor: PMI nondisplaced. Regular rate & rhythm. 2/6 Jim Lungs: clear Abdomen: soft, nontender, nondistended. No hepatosplenomegaly. No bruits or masses. Good bowel sounds. Extremities: no cyanosis, clubbing, rash, edema Neuro: alert & orientedx3, cranial nerves grossly intact. moves all 4 extremities w/o difficulty. Affect pleasant  Etiology of CM unclear. In looking t echo I wonder if he may have LV noncompaction. Also has significant polyneuropathy which raises the question of TTR amyloid. Will need cMRI as outpatient.   For now would focus on getting him on appropriate GDMT regimen (see above). Will need to avoid free water.   Hopefully should be stable for d/c tomorrow. We have arranged outpatient f/u for him. He has poor insight into his HF and medication regimen. Will also set up Paramedicine support.   Sharyn Lull, MD  6:05 PM

## 2020-02-20 NOTE — Progress Notes (Signed)
PROGRESS NOTE        PATIENT DETAILS Name: Jim Dunn Age: 54 y.o. Sex: male Date of Birth: [birth year] Admit Date: 02/19/2020 Admitting Physician Vianne Bulls, MD KGY:[id number], Pcp Not In  Brief Narrative: Patient is a 54 y.o. male chronic systolic heart failure secondary to nonischemic cardiomyopathy, schizophrenia, COPD-presenting to the hospital with weakness, increasing tingling and numbness-found to have hyponatremia, hypotension and subsequently admitted to the hospitalist service.  Note-patient does acknowledge drinking "few bottles" of beer this past weekend.  Significant events: 6/17>> admit to [facility] for evaluation of hyponatremia/hypotension/weakness  Significant studies: None  Antimicrobial therapy: None  Microbiology data: None  Procedures : None  Consults: Advanced CHF team-Dr. Bensimhon over the phone.  DVT Prophylaxis : enoxaparin (LOVENOX) injection 40 mg Start: 02/20/20 1000   Subjective: Feels much better-less weakness-claims tingling/numbness in his hands and feet are almost 80% better.  Assessment/Plan: Hyponatremia: Suspect multifactorial-some element of dehydration-some element of free water/beer intake.  Improved with IV fluid hydration.  Continue fluid restrictions-supportive care-repeat electrolytes in a.m.  Hypokalemia: Secondary to diuretic regimen/hypomagnesemia and ?  EtOH use-continue to replete and recheck.  Hypomagnesemia: Secondary to diuretic regimen-?  EtOH use-replete and recheck.  Chronic systolic heart failure (EF 25-30% by TEE on 5/20): Per prior notes-has history of nonischemic cardiomyopathy.  Volume status stable-continue low-dose Coreg-given soft blood pressure-given hypotension on initial presentation/soft BP-stop Entresto.  Discussed with Dr. Wynn Banker a bit more stable-we can initiate losartan-add Aldactone for now.  Will reassess volume status tomorrow to see if other diuretics can be  reinitiated.  Remains on digoxin-check levels with a.m. labs.  Advanced heart failure team will either evaluate later today-or arrange for follow-up next week in the clinic.  COPD: Stable-continue bronchodilators  Seizure disorder: Continue phenytoin-check levels with a.m. labs.  Peripheral neuropathy: Has tingling/numbness for the past few months-worsened few days before this hospital stay-claims that this morning it is significantly better.  Suspect some neuropathy could be from hypotension/electrolyte abnormalities.  Vitamin B12/TSH within normal limits.  For now continue with supportive care.  Schizophrenia: Remains stable-continue Seroquel/BuSpar/prn Xanax  Alcohol use: Claims that he does not drink on a daily basis-only drinks on " occasion"-acknowledges that he had a few bottles of beer this past weekend.  He does have hypokalemia/hypomagnesemia-and will need to be watched closely for any signs of alcohol withdrawal.  Diet: Diet Order            Diet Heart Room service appropriate? Yes; Fluid consistency: Thin; Fluid restriction: 1500 mL Fluid  Diet effective now                  Code Status: Full code  Family Communication: None at bedside  Disposition Plan: Status is: Observation  The patient remains OBS appropriate and will d/c before 2 midnights.  Dispo: The patient is from: Home              Anticipated d/c is to: Home              Anticipated d/c date is: 1 day              Patient currently is not medically stable to d/c.  Barriers to Discharge: BP soft-needs electrolyte abnormalities corrected-diuretics restarted before consideration of discharge.  Antimicrobial agents: Anti-infectives (From admission, onward)   None       Time  spent: 25- minutes-Greater than 50% of this time was spent in counseling, explanation of diagnosis, planning of further management, and coordination of care.  MEDICATIONS: Scheduled Meds: . aspirin EC  81 mg Oral Daily  .  atorvastatin  40 mg Oral Daily  . busPIRone  10 mg Oral TID  . carvedilol  3.125 mg Oral BID WC  . digoxin  0.125 mg Oral Daily  . enoxaparin (LOVENOX) injection  40 mg Subcutaneous Daily  . gabapentin  300 mg Oral TID  . mometasone-formoterol  2 puff Inhalation BID  . nicotine  21 mg Transdermal Daily  . phenytoin  100 mg Oral TID  . potassium chloride  40 mEq Oral Q4H  . QUEtiapine  300 mg Oral Daily  . sodium chloride flush  3 mL Intravenous Once  . sodium chloride flush  3 mL Intravenous Q12H  . sodium chloride flush  3 mL Intravenous Q12H  . spironolactone  12.5 mg Oral Daily   Continuous Infusions: . sodium chloride    . magnesium sulfate bolus IVPB     PRN Meds:.sodium chloride, acetaminophen **OR** acetaminophen, albuterol, ALPRAZolam, ondansetron **OR** ondansetron (ZOFRAN) IV, senna-docusate, sodium chloride flush   PHYSICAL EXAM: Vital signs: Vitals:   02/20/20 0037 02/20/20 0413 02/20/20 0615 02/20/20 0859  BP: 121/81 (!) 93/58  113/84  Pulse: 95 97  (!) 101  Resp: 18 16  18   Temp: 97.9 F (36.6 C) 98.5 F (36.9 C)  98.4 F (36.9 C)  TempSrc: Oral Oral    SpO2: 98% 98%  94%  Weight: 82.7 kg     Height:   5\' 8"  (1.727 m)    Filed Weights   02/20/20 0037  Weight: 82.7 kg   Body mass index is 27.72 kg/m.   Gen Exam:Alert awake-not in any distress HEENT:atraumatic, normocephalic Chest: B/L clear to auscultation anteriorly CVS:S1S2 regular Abdomen:soft non tender, non distended Extremities:no edema Neurology: Non focal Skin: no rash  I have personally reviewed following labs and imaging studies  LABORATORY DATA: CBC: Recent Labs  Lab 02/19/20 2058 02/20/20 0457  WBC 8.7 7.8  NEUTROABS 4.1  --   HGB 12.6* 12.7*  HCT 40.5 39.7  MCV 63.8* 62.6*  PLT 287 280    Basic Metabolic Panel: Recent Labs  Lab 02/19/20 2058 02/19/20 2247 02/20/20 0457  NA 124*  --  128*  K 2.8*  --  2.9*  CL 79*  --  84*  CO2 31  --  29  GLUCOSE 94  --  85    BUN 25*  --  27*  CREATININE 1.05  --  0.78  CALCIUM 9.2  --  9.1  MG  --  1.5* 1.5*    GFR: Estimated Creatinine Clearance: 113.2 mL/min (by C-G formula based on SCr of 0.78 mg/dL).  Liver Function Tests: Recent Labs  Lab 02/19/20 2058 02/20/20 0457  AST 33 29  ALT 24 22  ALKPHOS 299* 289*  BILITOT 1.0 0.9  PROT 7.5 7.3  ALBUMIN 3.5 3.5   No results for input(s): LIPASE, AMYLASE in the last 168 hours. No results for input(s): AMMONIA in the last 168 hours.  Coagulation Profile: Recent Labs  Lab 02/19/20 2058  INR 1.1    Cardiac Enzymes: No results for input(s): CKTOTAL, CKMB, CKMBINDEX, TROPONINI in the last 168 hours.  BNP (last 3 results) No results for input(s): PROBNP in the last 8760 hours.  Lipid Profile: No results for input(s): CHOL, HDL, LDLCALC, TRIG, CHOLHDL, LDLDIRECT in the last  72 hours.  Thyroid Function Tests: Recent Labs    02/20/20 0457  TSH 0.437    Anemia Panel: Recent Labs    02/20/20 0457  VITAMINB12 679    Urine analysis:    Component Value Date/Time   COLORURINE STRAW (A) 02/19/2020 2206   APPEARANCEUR CLEAR 02/19/2020 2206   LABSPEC 1.004 (L) 02/19/2020 2206   PHURINE 7.0 02/19/2020 2206   GLUCOSEU NEGATIVE 02/19/2020 2206   HGBUR NEGATIVE 02/19/2020 2206   BILIRUBINUR NEGATIVE 02/19/2020 2206   KETONESUR NEGATIVE 02/19/2020 2206   PROTEINUR NEGATIVE 02/19/2020 2206   NITRITE NEGATIVE 02/19/2020 2206   LEUKOCYTESUR NEGATIVE 02/19/2020 2206    Sepsis Labs: Lactic Acid, Venous    Component Value Date/Time   LATICACIDVEN 1.4 02/19/2020 2058    MICROBIOLOGY: Recent Results (from the past 240 hour(s))  SARS Coronavirus 2 by RT PCR (hospital order, performed in [facility] hospital lab) Nasopharyngeal Nasopharyngeal Swab     Status: None   Collection Time: 02/19/20 11:09 PM   Specimen: Nasopharyngeal Swab  Result Value Ref Range Status   SARS Coronavirus 2 NEGATIVE NEGATIVE Final    Comment: (NOTE) SARS-CoV-2  target nucleic acids are NOT DETECTED.  The SARS-CoV-2 RNA is generally detectable in upper and lower respiratory specimens during the acute phase of infection. The lowest concentration of SARS-CoV-2 viral copies this assay can detect is 250 copies / mL. A negative result does not preclude SARS-CoV-2 infection and should not be used as the sole basis for treatment or other patient management decisions.  A negative result may occur with improper specimen collection / handling, submission of specimen other than nasopharyngeal swab, presence of viral mutation(s) within the areas targeted by this assay, and inadequate number of viral copies (<250 copies / mL). A negative result must be combined with clinical observations, patient history, and epidemiological information.  Fact Sheet for Patients:   BoilerBrush.com.cy  Fact Sheet for Healthcare Providers: https://pope.com/  This test is not yet approved or  cleared by the [place] FDA and has been authorized for detection and/or diagnosis of SARS-CoV-2 by FDA under an Emergency Use Authorization (EUA).  This EUA will remain in effect (meaning this test can be used) for the duration of the COVID-19 declaration under Section 564(b)(1) of the Act, 21 U.S.C. section 360bbb-3(b)(1), unless the authorization is terminated or revoked sooner.  Performed at [facility], [address] [place], [place] [address]     RADIOLOGY STUDIES/RESULTS: DG Chest 2 View  Result Date: 02/19/2020 CLINICAL DATA:  Weakness EXAM: CHEST - 2 VIEW COMPARISON:  01/22/2020 FINDINGS: Cardiomegaly. No confluent opacities, effusions or edema. No acute bony abnormality. IMPRESSION: Cardiomegaly.  No active disease. Electronically Signed   By: Charlett Nose M.D.   On: 02/19/2020 21:26   CT HEAD WO CONTRAST  Result Date: 02/19/2020 CLINICAL DATA:  Numbness in bilateral hands and feet. EXAM: CT HEAD WITHOUT  CONTRAST TECHNIQUE: Contiguous axial images were obtained from the base of the skull through the vertex without intravenous contrast. COMPARISON:  None. FINDINGS: Brain: 5.8 cm cystic structure noted in the right posterior cerebral hemisphere compatible with arachnoid cyst. No hemorrhage, hydrocephalus, acute infarction or midline shift. Vascular: No hyperdense vessel or unexpected calcification. Skull: No acute calvarial abnormality. Sinuses/Orbits: Visualized paranasal sinuses and mastoids clear. Orbital soft tissues unremarkable. Other: None IMPRESSION: 5.8 cm posterior right arachnoid cyst. No acute intracranial abnormality. Electronically Signed   By: Charlett Nose M.D.   On: 02/19/2020 21:46  LOS: 0 days   Jeoffrey Massed, MD  [facility]    To contact the attending provider between 7A-7P or the covering provider during after hours 7P-7A, please log into the web site [email or web address] and access using universal [place] password for that web site. If you do not have the password, please call the hospital operator.  02/20/2020, 12:31 PM

## 2020-02-21 LAB — BASIC METABOLIC PANEL
Anion gap: 12 (ref 5–15)
BUN: 20 mg/dL (ref 6–20)
CO2: 33 mmol/L — ABNORMAL HIGH (ref 22–32)
Calcium: 9.4 mg/dL (ref 8.9–10.3)
Chloride: 87 mmol/L — ABNORMAL LOW (ref 98–111)
Creatinine, Ser: 0.63 mg/dL (ref 0.61–1.24)
GFR calc Af Amer: >60 mL/min (ref >60)
GFR calc non Af Amer: >60 mL/min (ref >60)
Glucose, Bld: 101 mg/dL — ABNORMAL HIGH (ref 70–99)
Potassium: 3.4 mmol/L — ABNORMAL LOW (ref 3.5–5.1)
Sodium: 132 mmol/L — ABNORMAL LOW (ref 135–145)

## 2020-02-21 LAB — PHENYTOIN LEVEL, TOTAL: Phenytoin Lvl: 2.8 ug/mL — ABNORMAL LOW (ref 10.0–20.0)

## 2020-02-21 LAB — DIGOXIN LEVEL: Digoxin Level: <0.2 ng/mL — ABNORMAL LOW (ref 0.8–2.0)

## 2020-02-21 LAB — MAGNESIUM: Magnesium: 2.1 mg/dL (ref 1.7–2.4)

## 2020-02-21 MED ORDER — POTASSIUM CHLORIDE CRYS ER 20 MEQ PO TBCR: 40.0000 meq | EXTENDED_RELEASE_TABLET | Freq: Once | ORAL | Status: DC

## 2020-02-21 MED ORDER — PHENYTOIN SODIUM EXTENDED 100 MG PO CAPS
100.0000 mg | ORAL_CAPSULE | Freq: Two times a day (BID) | ORAL | Status: DC
  Filled 2020-02-21 (×2): qty 1

## 2020-02-21 MED ORDER — PHENYTOIN SODIUM EXTENDED 30 MG PO CAPS: 150.0000 mg | ORAL_CAPSULE | Freq: Every day | ORAL | Status: DC

## 2020-02-21 MED ORDER — SODIUM CHLORIDE 0.9 % IV SOLN
500.0000 mg | Freq: Once | INTRAVENOUS | Status: DC
  Filled 2020-02-21: qty 10

## 2020-02-21 NOTE — Progress Notes (Signed)
Patient left AMA, removed IV and cardiac monitoring.

## 2020-02-21 NOTE — Discharge Summary (Addendum)
AMA  Was paged at 7 AM that patient is willing to leave AMA and he is already dressed up to go, I asked the nurses to hold the patient till he has been seen by me and I will be there shortly, went to see the patient within half an hour, patient was already standing on the door, he is not suicidal or homicidal, he is awake alert and oriented x3, patient at this time expresses desire to leave the Hospital immidiately, patient has been warned that this is not Medically advisable at this time, and can result in Medical complications like Death and Disability, patient understands and accepts the risks involved and assumes full responsibilty of this decision.  He said he would gladly sign his papers and go home, states he lives with his mother who takes good care of him.   Susa Raring M.D on 02/21/2020 at 8:23 AM  [facility]  Time < 30 minutes  Last Note Below    Ghimire, Werner Lean, MD  Physician  Hospitalist  Progress Notes     Signed  Date of Service:  02/20/2020 12:31 PM          Signed      Expand All Collapse All  Show:Clear all [x] Manual[x] Template[] Copied  Added by: [x] Ghimire, , MD  [] Hover for details                                                                                                                            PROGRESS NOTE                                                               PATIENT DETAILS Name: Jim Dunn Age: 54 y.o. Sex: male Date of Birth: 1965-09-30 Admit Date: 02/19/2020 Admitting Physician 46, MD 04/05/1966, Pcp Not In  Brief Narrative: Patient is a 54 y.o. male chronic systolic heart failure secondary to nonischemic cardiomyopathy, schizophrenia, COPD-presenting to the hospital with weakness, increasing tingling and numbness-found to have hyponatremia,  hypotension and subsequently admitted to the hospitalist service.  Note-patient does acknowledge drinking "few bottles" of beer this past weekend.  Significant events: 6/17>> admit to [facility] for evaluation of hyponatremia/hypotension/weakness  Significant studies: None  Antimicrobial therapy: None  Microbiology data: None  Procedures : None  Consults: Advanced CHF team-Dr. Bensimhon over the phone.  DVT Prophylaxis : enoxaparin (LOVENOX) injection 40 mg Start: 02/20/20 1000  Subjective: Feels  much better-less weakness-claims tingling/numbness in his hands and feet are almost 80% better.  Assessment/Plan: Hyponatremia: Suspect multifactorial-some element of dehydration-some element of free water/beer intake.  Improved with IV fluid hydration.  Continue fluid restrictions-supportive care-repeat electrolytes in a.m.  Hypokalemia: Secondary to diuretic regimen/hypomagnesemia and ?  EtOH use-continue to replete and recheck.  Hypomagnesemia: Secondary to diuretic regimen-?  EtOH use-replete and recheck.  Chronic systolic heart failure (EF 25-30% by TEE on 5/20): Per prior notes-has history of nonischemic cardiomyopathy.  Volume status stable-continue low-dose Coreg-given soft blood pressure-given hypotension on initial presentation/soft BP-stop Entresto.  Discussed with Dr. Margarita Mail a bit more stable-we can initiate losartan-add Aldactone for now.  Will reassess volume status tomorrow to see if other diuretics can be reinitiated.  Remains on digoxin-check levels with a.m. labs.  Advanced heart failure team will either evaluate later today-or arrange for follow-up next week in the clinic.  COPD: Stable-continue bronchodilators  Seizure disorder: Continue phenytoin-check levels with a.m. labs.  Peripheral neuropathy: Has tingling/numbness for the past few months-worsened few days before this hospital stay-claims that this morning it is significantly better.   Suspect some neuropathy could be from hypotension/electrolyte abnormalities.  Vitamin B12/TSH within normal limits.  For now continue with supportive care.  Schizophrenia: Remains stable-continue Seroquel/BuSpar/prn Xanax  Alcohol use: Claims that he does not drink on a daily basis-only drinks on " occasion"-acknowledges that he had a few bottles of beer this past weekend.  He does have hypokalemia/hypomagnesemia-and will need to be watched closely for any signs of alcohol withdrawal.  Diet:    Diet Order                  Diet Heart Room service appropriate? Yes; Fluid consistency: Thin; Fluid restriction: 1500 mL Fluid  Diet effective now                   Code Status: Full code  Family Communication: None at bedside  Disposition Plan: Status is: Observation  The patient remains OBS appropriate and will d/c before 2 midnights.  Dispo: The patient is from: Home  Anticipated d/c is to: Home  Anticipated d/c date is: 1 day  Patient currently is not medically stable to d/c.  Barriers to Discharge: BP soft-needs electrolyte abnormalities corrected-diuretics restarted before consideration of discharge.  Antimicrobial agents:    Anti-infectives (From admission, onward)     None         Time spent: 25- minutes-Greater than 50% of this time was spent in counseling, explanation of diagnosis, planning of further management, and coordination of care.  MEDICATIONS: Scheduled Meds: . aspirin EC  81 mg Oral Daily  . atorvastatin  40 mg Oral Daily  . busPIRone  10 mg Oral TID  . carvedilol  3.125 mg Oral BID WC  . digoxin  0.125 mg Oral Daily  . enoxaparin (LOVENOX) injection  40 mg Subcutaneous Daily  . gabapentin  300 mg Oral TID  . mometasone-formoterol  2 puff Inhalation BID  . nicotine  21 mg Transdermal Daily  . phenytoin  100 mg Oral TID  . potassium chloride  40 mEq Oral Q4H  . QUEtiapine  300 mg Oral  Daily  . sodium chloride flush  3 mL Intravenous Once  . sodium chloride flush  3 mL Intravenous Q12H  . sodium chloride flush  3 mL Intravenous Q12H  . spironolactone  12.5 mg Oral Daily   Continuous Infusions: . sodium chloride    . magnesium sulfate bolus IVPB  PRN Meds:.sodium chloride, acetaminophen **OR** acetaminophen, albuterol, ALPRAZolam, ondansetron **OR** ondansetron (ZOFRAN) IV, senna-docusate, sodium chloride flush   PHYSICAL EXAM: Vital signs:       Vitals:   02/20/20 0037 02/20/20 0413 02/20/20 0615 02/20/20 0859  BP: 121/81 (!) 93/58  113/84  Pulse: 95 97  (!) 101  Resp: 18 16  18   Temp: 97.9 F (36.6 C) 98.5 F (36.9 C)  98.4 F (36.9 C)  TempSrc: Oral Oral    SpO2: 98% 98%  94%  Weight: 82.7 kg     Height:   5\' 8"  (1.727 m)       Filed Weights   02/20/20 0037  Weight: 82.7 kg   Body mass index is 27.72 kg/m.   Gen Exam:Alert awake-not in any distress HEENT:atraumatic, normocephalic Chest: B/L clear to auscultation anteriorly CVS:S1S2 regular Abdomen:soft non tender, non distended Extremities:no edema Neurology: Non focal Skin: no rash  I have personally reviewed following labs and imaging studies  LABORATORY DATA: CBC: Last Labs       Recent Labs  Lab 02/19/20 2058 02/20/20 0457  WBC 8.7 7.8  NEUTROABS 4.1  --   HGB 12.6* 12.7*  HCT 40.5 39.7  MCV 63.8* 62.6*  PLT 287 280      Basic Metabolic Panel: Last Labs        Recent Labs  Lab 02/19/20 2058 02/19/20 2247 02/20/20 0457  NA 124*  --  128*  K 2.8*  --  2.9*  CL 79*  --  84*  CO2 31  --  29  GLUCOSE 94  --  85  BUN 25*  --  27*  CREATININE 1.05  --  0.78  CALCIUM 9.2  --  9.1  MG  --  1.5* 1.5*      GFR: Estimated Creatinine Clearance: 113.2 mL/min (by C-G formula based on SCr of 0.78 mg/dL).  Liver Function Tests: Last Labs       Recent Labs  Lab 02/19/20 2058 02/20/20 0457  AST 33 29  ALT 24 22  ALKPHOS 299* 289*    BILITOT 1.0 0.9  PROT 7.5 7.3  ALBUMIN 3.5 3.5     Last Labs   No results for input(s): LIPASE, AMYLASE in the last 168 hours.   Last Labs   No results for input(s): AMMONIA in the last 168 hours.    Coagulation Profile: Last Labs      Recent Labs  Lab 02/19/20 2058  INR 1.1      Cardiac Enzymes: Last Labs   No results for input(s): CKTOTAL, CKMB, CKMBINDEX, TROPONINI in the last 168 hours.    BNP (last 3 results) Recent Labs (within last 365 days)  No results for input(s): PROBNP in the last 8760 hours.    Lipid Profile: Recent Labs (last 2 labs)   No results for input(s): CHOL, HDL, LDLCALC, TRIG, CHOLHDL, LDLDIRECT in the last 72 hours.    Thyroid Function Tests: Recent Labs (last 2 labs)      Recent Labs    02/20/20 0457  TSH 0.437      Anemia Panel: Recent Labs (last 2 labs)      Recent Labs    02/20/20 0457  VITAMINB12 679      Urine analysis: Labs (Brief)          Component Value Date/Time   COLORURINE STRAW (A) 02/19/2020 2206   APPEARANCEUR CLEAR 02/19/2020 2206   LABSPEC 1.004 (L) 02/19/2020 2206   PHURINE 7.0 02/19/2020 2206  GLUCOSEU NEGATIVE 02/19/2020 2206   HGBUR NEGATIVE 02/19/2020 2206   BILIRUBINUR NEGATIVE 02/19/2020 2206   KETONESUR NEGATIVE 02/19/2020 2206   PROTEINUR NEGATIVE 02/19/2020 2206   NITRITE NEGATIVE 02/19/2020 2206   LEUKOCYTESUR NEGATIVE 02/19/2020 2206      Sepsis Labs: Lactic Acid, Venous Labs (Brief)          Component Value Date/Time   LATICACIDVEN 1.4 02/19/2020 2058      MICROBIOLOGY:        Recent Results (from the past 240 hour(s))  SARS Coronavirus 2 by RT PCR (hospital order, performed in [facility] hospital lab) Nasopharyngeal Nasopharyngeal Swab     Status: None   Collection Time: 02/19/20 11:09 PM   Specimen: Nasopharyngeal Swab  Result Value Ref Range Status   SARS Coronavirus 2 NEGATIVE NEGATIVE Final    Comment: (NOTE) SARS-CoV-2 target  nucleic acids are NOT DETECTED.  The SARS-CoV-2 RNA is generally detectable in upper and lower respiratory specimens during the acute phase of infection. The lowest concentration of SARS-CoV-2 viral copies this assay can detect is 250 copies / mL. A negative result does not preclude SARS-CoV-2 infection and should not be used as the sole basis for treatment or other patient management decisions.  A negative result may occur with improper specimen collection / handling, submission of specimen other than nasopharyngeal swab, presence of viral mutation(s) within the areas targeted by this assay, and inadequate number of viral copies (<250 copies / mL). A negative result must be combined with clinical observations, patient history, and epidemiological information.  Fact Sheet for Patients:   BoilerBrush.com.cy  Fact Sheet for Healthcare Providers: https://pope.com/  This test is not yet approved or  cleared by the [place] FDA and has been authorized for detection and/or diagnosis of SARS-CoV-2 by FDA under an Emergency Use Authorization (EUA).  This EUA will remain in effect (meaning this test can be used) for the duration of the COVID-19 declaration under Section 564(b)(1) of the Act, 21 U.S.C. section 360bbb-3(b)(1), unless the authorization is terminated or revoked sooner.  Performed at [facility], [address], [place], [place] [address]     RADIOLOGY STUDIES/RESULTS:  Imaging Results (Last 48 hours)  DG Chest 2 View  Result Date: 02/19/2020 CLINICAL DATA:  Weakness EXAM: CHEST - 2 VIEW COMPARISON:  01/22/2020 FINDINGS: Cardiomegaly. No confluent opacities, effusions or edema. No acute bony abnormality. IMPRESSION: Cardiomegaly.  No active disease. Electronically Signed   By: Charlett Nose M.D.   On: 02/19/2020 21:26   CT HEAD WO CONTRAST  Result Date: 02/19/2020 CLINICAL DATA:  Numbness in bilateral  hands and feet. EXAM: CT HEAD WITHOUT CONTRAST TECHNIQUE: Contiguous axial images were obtained from the base of the skull through the vertex without intravenous contrast. COMPARISON:  None. FINDINGS: Brain: 5.8 cm cystic structure noted in the right posterior cerebral hemisphere compatible with arachnoid cyst. No hemorrhage, hydrocephalus, acute infarction or midline shift. Vascular: No hyperdense vessel or unexpected calcification. Skull: No acute calvarial abnormality. Sinuses/Orbits: Visualized paranasal sinuses and mastoids clear. Orbital soft tissues unremarkable. Other: None IMPRESSION: 5.8 cm posterior right arachnoid cyst. No acute intracranial abnormality. Electronically Signed   By: Charlett Nose M.D.   On: 02/19/2020 21:46      LOS: 0 days   Jeoffrey Massed, MD  [facility]    To contact the attending provider between 7A-7P or the covering provider during after hours 7P-7A, please log into the web site [email or web address] and access using universal [place] password  for that web site. If you do not have the password, please call the hospital operator.  02/20/2020, 12:31 PM          Note Details  Author Maretta Bees, MD File Time 02/20/2020 12:50 PM  Author Type Physician Status Signed  Last Editor Maretta Bees, MD Service [facility] Acct # [id number] Admit Date 02/19/2020

## 2020-02-21 NOTE — Progress Notes (Signed)
MEDICATION RELATED CONSULT NOTE - FOLLOW UP   Pharmacy Consult for Phenytoin Indication: History of seizures, low phenytoin level  No Known Allergies  Patient Measurements: Height: 5\' 8"  (172.7 cm) Weight: 82.4 kg (181 lb 10.5 oz) IBW/kg (Calculated) : 68.4  Vital Signs: Temp: 98.6 F (37 C) (06/19 0409) Temp Source: Oral (06/19 0409) BP: 125/92 (06/19 0409) Pulse Rate: 94 (06/19 0409) Intake/Output from previous day: 06/18 0701 - 06/19 0700 In: 716 [P.O.:716] Out: 1200 [Urine:1200] Intake/Output from this shift: Total I/O In: 480 [P.O.:480] Out: 1200 [Urine:1200]  Labs: Recent Labs    02/19/20 2058 02/19/20 2247 02/20/20 0457 02/21/20 0334  WBC 8.7  --  7.8  --   HGB 12.6*  --  12.7*  --   HCT 40.5  --  39.7  --   PLT 287  --  280  --   APTT 32  --   --   --   CREATININE 1.05  --  0.78 0.63  MG  --  1.5* 1.5* 2.1  ALBUMIN 3.5  --  3.5  --   PROT 7.5  --  7.3  --   AST 33  --  29  --   ALT 24  --  22  --   ALKPHOS 299*  --  289*  --   BILITOT 1.0  --  0.9  --   BILIDIR  --   --  0.3*  --   IBILI  --   --  0.6  --    Estimated Creatinine Clearance: 113.1 mL/min (by C-G formula based on SCr of 0.63 mg/dL).   Microbiology: Recent Results (from the past 720 hour(s))  SARS Coronavirus 2 by RT PCR (hospital order, performed in [facility] hospital lab) Nasopharyngeal Nasopharyngeal Swab     Status: None   Collection Time: 01/27/20  1:30 PM   Specimen: Nasopharyngeal Swab  Result Value Ref Range Status   SARS Coronavirus 2 NEGATIVE NEGATIVE Final    Comment: (NOTE) SARS-CoV-2 target nucleic acids are NOT DETECTED. The SARS-CoV-2 RNA is generally detectable in upper and lower respiratory specimens during the acute phase of infection. The lowest concentration of SARS-CoV-2 viral copies this assay can detect is 250 copies / mL. A negative result does not preclude SARS-CoV-2 infection and should not be used as the sole basis for treatment or other patient  management decisions.  A negative result may occur with improper specimen collection / handling, submission of specimen other than nasopharyngeal swab, presence of viral mutation(s) within the areas targeted by this assay, and inadequate number of viral copies (<250 copies / mL). A negative result must be combined with clinical observations, patient history, and epidemiological information. Fact Sheet for Patients:   01/29/20 Fact Sheet for Healthcare Providers: BoilerBrush.com.cy This test is not yet approved or cleared  by the https://pope.com/ FDA and has been authorized for detection and/or diagnosis of SARS-CoV-2 by FDA under an Emergency Use Authorization (EUA).  This EUA will remain in effect (meaning this test can be used) for the duration of the COVID-19 declaration under Section 564(b)(1) of the Act, 21 U.S.C. section 360bbb-3(b)(1), unless the authorization is terminated or revoked sooner. Performed at [facility], [address], [place], [place]   SARS Coronavirus 2 by RT PCR (hospital order, performed in [facility] hospital lab) Nasopharyngeal Nasopharyngeal Swab     Status: None   Collection Time: 02/19/20 11:09 PM   Specimen: Nasopharyngeal Swab  Result Value Ref  Range Status   SARS Coronavirus 2 NEGATIVE NEGATIVE Final    Comment: (NOTE) SARS-CoV-2 target nucleic acids are NOT DETECTED.  The SARS-CoV-2 RNA is generally detectable in upper and lower respiratory specimens during the acute phase of infection. The lowest concentration of SARS-CoV-2 viral copies this assay can detect is 250 copies / mL. A negative result does not preclude SARS-CoV-2 infection and should not be used as the sole basis for treatment or other patient management decisions.  A negative result may occur with improper specimen collection / handling, submission of specimen other than nasopharyngeal swab, presence of viral  mutation(s) within the areas targeted by this assay, and inadequate number of viral copies (<250 copies / mL). A negative result must be combined with clinical observations, patient history, and epidemiological information.  Fact Sheet for Patients:   StrictlyIdeas.no  Fact Sheet for Healthcare Providers: BankingDealers.co.za  This test is not yet approved or  cleared by the [place] FDA and has been authorized for detection and/or diagnosis of SARS-CoV-2 by FDA under an Emergency Use Authorization (EUA).  This EUA will remain in effect (meaning this test can be used) for the duration of the COVID-19 declaration under Section 564(b)(1) of the Act, 21 U.S.C. section 360bbb-3(b)(1), unless the authorization is terminated or revoked sooner.  Performed at [facility], [place] [address] [place], [place] [address]     Assessment: 54 y/o M on phenytoin PTA for history of seizures. Phenytoin level this AM is low at 2.8 (Albumin WNL at 3.5). PTA dose is 100 mg TID.   Goal of Therapy:  Phenytoin Level 10-20 mg/L  Plan:  -Phenytoin IV 500 mg IV x 1 now -Increase Dilantin ER dose from 100 mg TID (total daily dose 300 mg) to 100 mg BID and 150 mg at HS (total daily dose 350 mg) -Re-check level in 3 days  Narda Bonds, PharmD, [place] Pharmacist Phone: [phone]

## 2020-02-23 ENCOUNTER — Encounter (HOSPITAL_COMMUNITY): Payer: Self-pay | Admitting: Emergency Medicine

## 2020-02-23 ENCOUNTER — Other Ambulatory Visit: Payer: Self-pay

## 2020-02-23 ENCOUNTER — Emergency Department (HOSPITAL_COMMUNITY): Payer: Medicare PPO

## 2020-02-23 ENCOUNTER — Inpatient Hospital Stay (HOSPITAL_COMMUNITY)
Admission: EM | Admit: 2020-02-23 | Discharge: 2020-06-08 | DRG: 982 | Disposition: A | Discharge status: Home / Self Care | Payer: Medicare PPO | Attending: Family Medicine | Admitting: Family Medicine

## 2020-02-23 DIAGNOSIS — M79672 Pain in left foot: Secondary | ICD-10-CM | POA: Diagnosis not present

## 2020-02-23 DIAGNOSIS — E222 Syndrome of inappropriate secretion of antidiuretic hormone: Principal | ICD-10-CM | POA: Diagnosis present

## 2020-02-23 DIAGNOSIS — Z6834 Body mass index (BMI) 34.0-34.9, adult: Secondary | ICD-10-CM

## 2020-02-23 DIAGNOSIS — K0401 Reversible pulpitis: Secondary | ICD-10-CM | POA: Diagnosis not present

## 2020-02-23 DIAGNOSIS — E861 Hypovolemia: Secondary | ICD-10-CM | POA: Diagnosis present

## 2020-02-23 DIAGNOSIS — Z7951 Long term (current) use of inhaled steroids: Secondary | ICD-10-CM

## 2020-02-23 DIAGNOSIS — R569 Unspecified convulsions: Secondary | ICD-10-CM

## 2020-02-23 DIAGNOSIS — M79671 Pain in right foot: Secondary | ICD-10-CM | POA: Diagnosis not present

## 2020-02-23 DIAGNOSIS — I272 Pulmonary hypertension, unspecified: Secondary | ICD-10-CM | POA: Diagnosis not present

## 2020-02-23 DIAGNOSIS — K029 Dental caries, unspecified: Secondary | ICD-10-CM

## 2020-02-23 DIAGNOSIS — E871 Hypo-osmolality and hyponatremia: Secondary | ICD-10-CM | POA: Diagnosis present

## 2020-02-23 DIAGNOSIS — K8020 Calculus of gallbladder without cholecystitis without obstruction: Secondary | ICD-10-CM | POA: Diagnosis present

## 2020-02-23 DIAGNOSIS — G4733 Obstructive sleep apnea (adult) (pediatric): Secondary | ICD-10-CM | POA: Diagnosis present

## 2020-02-23 DIAGNOSIS — D509 Iron deficiency anemia, unspecified: Secondary | ICD-10-CM | POA: Diagnosis present

## 2020-02-23 DIAGNOSIS — Z20822 Contact with and (suspected) exposure to covid-19: Secondary | ICD-10-CM | POA: Diagnosis present

## 2020-02-23 DIAGNOSIS — I071 Rheumatic tricuspid insufficiency: Secondary | ICD-10-CM | POA: Diagnosis present

## 2020-02-23 DIAGNOSIS — F17210 Nicotine dependence, cigarettes, uncomplicated: Secondary | ICD-10-CM | POA: Diagnosis present

## 2020-02-23 DIAGNOSIS — K0530 Chronic periodontitis, unspecified: Secondary | ICD-10-CM | POA: Diagnosis present

## 2020-02-23 DIAGNOSIS — R7889 Finding of other specified substances, not normally found in blood: Secondary | ICD-10-CM

## 2020-02-23 DIAGNOSIS — E782 Mixed hyperlipidemia: Secondary | ICD-10-CM | POA: Diagnosis present

## 2020-02-23 DIAGNOSIS — I5042 Chronic combined systolic (congestive) and diastolic (congestive) heart failure: Secondary | ICD-10-CM | POA: Diagnosis present

## 2020-02-23 DIAGNOSIS — E669 Obesity, unspecified: Secondary | ICD-10-CM | POA: Diagnosis present

## 2020-02-23 DIAGNOSIS — G629 Polyneuropathy, unspecified: Secondary | ICD-10-CM | POA: Diagnosis present

## 2020-02-23 DIAGNOSIS — K047 Periapical abscess without sinus: Secondary | ICD-10-CM | POA: Diagnosis present

## 2020-02-23 DIAGNOSIS — Z87442 Personal history of urinary calculi: Secondary | ICD-10-CM

## 2020-02-23 DIAGNOSIS — I5022 Chronic systolic (congestive) heart failure: Secondary | ICD-10-CM | POA: Diagnosis not present

## 2020-02-23 DIAGNOSIS — Z9119 Patient's noncompliance with other medical treatment and regimen: Secondary | ICD-10-CM | POA: Diagnosis not present

## 2020-02-23 DIAGNOSIS — K122 Cellulitis and abscess of mouth: Secondary | ICD-10-CM | POA: Diagnosis not present

## 2020-02-23 DIAGNOSIS — F4312 Post-traumatic stress disorder, chronic: Secondary | ICD-10-CM | POA: Diagnosis present

## 2020-02-23 DIAGNOSIS — Z9151 Personal history of suicidal behavior: Secondary | ICD-10-CM | POA: Diagnosis not present

## 2020-02-23 DIAGNOSIS — J410 Simple chronic bronchitis: Secondary | ICD-10-CM | POA: Diagnosis not present

## 2020-02-23 DIAGNOSIS — M7989 Other specified soft tissue disorders: Secondary | ICD-10-CM | POA: Diagnosis not present

## 2020-02-23 DIAGNOSIS — E8770 Fluid overload, unspecified: Secondary | ICD-10-CM | POA: Diagnosis not present

## 2020-02-23 DIAGNOSIS — Z751 Person awaiting admission to adequate facility elsewhere: Secondary | ICD-10-CM

## 2020-02-23 DIAGNOSIS — J449 Chronic obstructive pulmonary disease, unspecified: Secondary | ICD-10-CM | POA: Diagnosis present

## 2020-02-23 DIAGNOSIS — F0390 Unspecified dementia without behavioral disturbance: Secondary | ICD-10-CM | POA: Diagnosis present

## 2020-02-23 DIAGNOSIS — Z9114 Patient's other noncompliance with medication regimen: Secondary | ICD-10-CM | POA: Diagnosis not present

## 2020-02-23 DIAGNOSIS — G40909 Epilepsy, unspecified, not intractable, without status epilepticus: Secondary | ICD-10-CM | POA: Diagnosis present

## 2020-02-23 DIAGNOSIS — K083 Retained dental root: Secondary | ICD-10-CM | POA: Diagnosis present

## 2020-02-23 DIAGNOSIS — F200 Paranoid schizophrenia: Secondary | ICD-10-CM | POA: Diagnosis present

## 2020-02-23 DIAGNOSIS — F1010 Alcohol abuse, uncomplicated: Secondary | ICD-10-CM | POA: Diagnosis present

## 2020-02-23 DIAGNOSIS — I5082 Biventricular heart failure: Secondary | ICD-10-CM | POA: Diagnosis present

## 2020-02-23 DIAGNOSIS — Z79899 Other long term (current) drug therapy: Secondary | ICD-10-CM

## 2020-02-23 DIAGNOSIS — E876 Hypokalemia: Secondary | ICD-10-CM | POA: Diagnosis present

## 2020-02-23 DIAGNOSIS — E860 Dehydration: Secondary | ICD-10-CM | POA: Diagnosis present

## 2020-02-23 DIAGNOSIS — R9431 Abnormal electrocardiogram [ECG] [EKG]: Secondary | ICD-10-CM | POA: Diagnosis present

## 2020-02-23 DIAGNOSIS — F79 Unspecified intellectual disabilities: Secondary | ICD-10-CM | POA: Diagnosis present

## 2020-02-23 DIAGNOSIS — K05219 Aggressive periodontitis, localized, unspecified severity: Secondary | ICD-10-CM | POA: Diagnosis present

## 2020-02-23 DIAGNOSIS — G4700 Insomnia, unspecified: Secondary | ICD-10-CM | POA: Diagnosis not present

## 2020-02-23 DIAGNOSIS — R4182 Altered mental status, unspecified: Secondary | ICD-10-CM | POA: Diagnosis not present

## 2020-02-23 DIAGNOSIS — T502X5A Adverse effect of carbonic-anhydrase inhibitors, benzothiadiazides and other diuretics, initial encounter: Secondary | ICD-10-CM | POA: Diagnosis not present

## 2020-02-23 DIAGNOSIS — R509 Fever, unspecified: Secondary | ICD-10-CM | POA: Diagnosis not present

## 2020-02-23 DIAGNOSIS — I110 Hypertensive heart disease with heart failure: Secondary | ICD-10-CM | POA: Diagnosis present

## 2020-02-23 DIAGNOSIS — R739 Hyperglycemia, unspecified: Secondary | ICD-10-CM | POA: Diagnosis not present

## 2020-02-23 DIAGNOSIS — J431 Panlobular emphysema: Secondary | ICD-10-CM | POA: Diagnosis not present

## 2020-02-23 DIAGNOSIS — I2729 Other secondary pulmonary hypertension: Secondary | ICD-10-CM | POA: Diagnosis present

## 2020-02-23 DIAGNOSIS — R45851 Suicidal ideations: Secondary | ICD-10-CM | POA: Diagnosis present

## 2020-02-23 DIAGNOSIS — Z8616 Personal history of COVID-19: Secondary | ICD-10-CM

## 2020-02-23 LAB — CBC
HCT: 40.3 % (ref 39.0–52.0)
Hemoglobin: 12.9 g/dL — ABNORMAL LOW (ref 13.0–17.0)
MCH: 20 pg — ABNORMAL LOW (ref 26.0–34.0)
MCHC: 32 g/dL (ref 30.0–36.0)
MCV: 62.5 fL — ABNORMAL LOW (ref 80.0–100.0)
Platelets: 243 10*3/uL (ref 150–400)
RBC: 6.45 MIL/uL — ABNORMAL HIGH (ref 4.22–5.81)
RDW: 26.8 % — ABNORMAL HIGH (ref 11.5–15.5)
WBC: 10.9 10*3/uL — ABNORMAL HIGH (ref 4.0–10.5)
nRBC: 0 % (ref 0.0–0.2)

## 2020-02-23 LAB — ETHANOL: Alcohol, Ethyl (B): <10 mg/dL (ref <10)

## 2020-02-23 LAB — BASIC METABOLIC PANEL
Anion gap: 14 (ref 5–15)
BUN: 12 mg/dL (ref 6–20)
CO2: 30 mmol/L (ref 22–32)
Calcium: 9.3 mg/dL (ref 8.9–10.3)
Chloride: 77 mmol/L — ABNORMAL LOW (ref 98–111)
Creatinine, Ser: 0.59 mg/dL — ABNORMAL LOW (ref 0.61–1.24)
GFR calc Af Amer: >60 mL/min (ref >60)
GFR calc non Af Amer: >60 mL/min (ref >60)
Glucose, Bld: 100 mg/dL — ABNORMAL HIGH (ref 70–99)
Potassium: 2.4 mmol/L — CL (ref 3.5–5.1)
Sodium: 121 mmol/L — ABNORMAL LOW (ref 135–145)

## 2020-02-23 LAB — URINALYSIS, ROUTINE W REFLEX MICROSCOPIC
Bilirubin Urine: NEGATIVE
Glucose, UA: NEGATIVE mg/dL
Hgb urine dipstick: NEGATIVE
Ketones, ur: NEGATIVE mg/dL
Leukocytes,Ua: NEGATIVE
Nitrite: NEGATIVE
Protein, ur: NEGATIVE mg/dL
Specific Gravity, Urine: 1.006 (ref 1.005–1.030)
pH: 7 (ref 5.0–8.0)

## 2020-02-23 LAB — COMPREHENSIVE METABOLIC PANEL
ALT: 21 U/L (ref 0–44)
AST: 31 U/L (ref 15–41)
Albumin: 3.6 g/dL (ref 3.5–5.0)
Alkaline Phosphatase: 294 U/L — ABNORMAL HIGH (ref 38–126)
Anion gap: 15 (ref 5–15)
BUN: 15 mg/dL (ref 6–20)
CO2: 25 mmol/L (ref 22–32)
Calcium: 9.3 mg/dL (ref 8.9–10.3)
Chloride: 77 mmol/L — ABNORMAL LOW (ref 98–111)
Creatinine, Ser: 0.53 mg/dL — ABNORMAL LOW (ref 0.61–1.24)
GFR calc Af Amer: >60 mL/min (ref >60)
GFR calc non Af Amer: >60 mL/min (ref >60)
Glucose, Bld: 102 mg/dL — ABNORMAL HIGH (ref 70–99)
Potassium: 2.6 mmol/L — CL (ref 3.5–5.1)
Sodium: 117 mmol/L — CL (ref 135–145)
Total Bilirubin: 1.4 mg/dL — ABNORMAL HIGH (ref 0.3–1.2)
Total Protein: 7.7 g/dL (ref 6.5–8.1)

## 2020-02-23 LAB — OSMOLALITY, URINE: Osmolality, Ur: 261 mOsm/kg — ABNORMAL LOW (ref 300–900)

## 2020-02-23 LAB — SARS CORONAVIRUS 2 BY RT PCR (HOSPITAL ORDER, PERFORMED IN ~~LOC~~ HOSPITAL LAB): SARS Coronavirus 2: NEGATIVE

## 2020-02-23 LAB — PHENYTOIN LEVEL, TOTAL: Phenytoin Lvl: <2.5 ug/mL — ABNORMAL LOW (ref 10.0–20.0)

## 2020-02-23 LAB — ACETAMINOPHEN LEVEL: Acetaminophen (Tylenol), Serum: <10 ug/mL — ABNORMAL LOW (ref 10–30)

## 2020-02-23 LAB — DIGOXIN LEVEL: Digoxin Level: <0.2 ng/mL — ABNORMAL LOW (ref 0.8–2.0)

## 2020-02-23 LAB — RAPID URINE DRUG SCREEN, HOSP PERFORMED
Amphetamines: NOT DETECTED
Barbiturates: NOT DETECTED
Benzodiazepines: NOT DETECTED
Cocaine: NOT DETECTED
Opiates: NOT DETECTED
Tetrahydrocannabinol: NOT DETECTED

## 2020-02-23 LAB — OSMOLALITY: Osmolality: 252 mOsm/kg — ABNORMAL LOW (ref 275–295)

## 2020-02-23 LAB — SALICYLATE LEVEL: Salicylate Lvl: <7 mg/dL — ABNORMAL LOW (ref 7.0–30.0)

## 2020-02-23 LAB — MAGNESIUM: Magnesium: 1.4 mg/dL — ABNORMAL LOW (ref 1.7–2.4)

## 2020-02-23 LAB — SODIUM: Sodium: 121 mmol/L — ABNORMAL LOW (ref 135–145)

## 2020-02-23 LAB — SODIUM, URINE, RANDOM: Sodium, Ur: 23 mmol/L

## 2020-02-23 MED ORDER — MAGNESIUM SULFATE 2 GM/50ML IV SOLN
2.0000 g | Freq: Once | INTRAVENOUS | Status: AC
  Administered 2020-02-23: 2 g via INTRAVENOUS
  Filled 2020-02-23: qty 50

## 2020-02-23 MED ORDER — SODIUM CHLORIDE 0.9 % IV SOLN
15.0000 mg/kg | Freq: Once | INTRAVENOUS | Status: AC
  Administered 2020-02-23: 1236 mg via INTRAVENOUS
  Filled 2020-02-23: qty 24.72

## 2020-02-23 MED ORDER — CARVEDILOL 3.125 MG PO TABS
3.1250 mg | ORAL_TABLET | Freq: Two times a day (BID) | ORAL | Status: DC
  Administered 2020-02-25 – 2020-06-08 (×203): 3.125 mg via ORAL
  Filled 2020-02-23 (×207): qty 1

## 2020-02-23 MED ORDER — IPRATROPIUM-ALBUTEROL 0.5-2.5 (3) MG/3ML IN SOLN: 3.0000 mL | RESPIRATORY_TRACT | Status: DC | PRN

## 2020-02-23 MED ORDER — ACETAMINOPHEN 325 MG PO TABS
650.0000 mg | ORAL_TABLET | Freq: Four times a day (QID) | ORAL | Status: DC | PRN
  Administered 2020-02-24 – 2020-03-20 (×10): 650 mg via ORAL
  Filled 2020-02-23 (×11): qty 2

## 2020-02-23 MED ORDER — ONDANSETRON HCL 4 MG/2ML IJ SOLN: 4.0000 mg | Freq: Four times a day (QID) | INTRAMUSCULAR | Status: DC | PRN

## 2020-02-23 MED ORDER — PHENYTOIN SODIUM EXTENDED 100 MG PO CAPS
100.0000 mg | ORAL_CAPSULE | Freq: Three times a day (TID) | ORAL | Status: DC
  Administered 2020-02-24 – 2020-03-20 (×78): 100 mg via ORAL
  Filled 2020-02-23 (×78): qty 1

## 2020-02-23 MED ORDER — QUETIAPINE FUMARATE 200 MG PO TABS
300.0000 mg | ORAL_TABLET | Freq: Every day | ORAL | Status: DC
  Administered 2020-02-24 – 2020-02-25 (×2): 300 mg via ORAL
  Filled 2020-02-23 (×2): qty 1

## 2020-02-23 MED ORDER — NICOTINE 21 MG/24HR TD PT24
21.0000 mg | MEDICATED_PATCH | Freq: Every day | TRANSDERMAL | Status: DC
  Administered 2020-02-24 – 2020-03-17 (×14): 21 mg via TRANSDERMAL
  Filled 2020-02-23 (×24): qty 1

## 2020-02-23 MED ORDER — BUSPIRONE HCL 10 MG PO TABS
10.0000 mg | ORAL_TABLET | Freq: Three times a day (TID) | ORAL | Status: DC
  Administered 2020-02-24 – 2020-06-08 (×312): 10 mg via ORAL
  Filled 2020-02-23 (×314): qty 1

## 2020-02-23 MED ORDER — GABAPENTIN 300 MG PO CAPS
300.0000 mg | ORAL_CAPSULE | Freq: Two times a day (BID) | ORAL | Status: DC
  Administered 2020-02-24 – 2020-03-05 (×21): 300 mg via ORAL
  Filled 2020-02-23 (×16): qty 1
  Filled 2020-02-23: qty 3
  Filled 2020-02-23 (×4): qty 1

## 2020-02-23 MED ORDER — ATORVASTATIN CALCIUM 40 MG PO TABS
40.0000 mg | ORAL_TABLET | Freq: Every day | ORAL | Status: DC
  Administered 2020-02-24 – 2020-06-08 (×106): 40 mg via ORAL
  Filled 2020-02-23 (×106): qty 1

## 2020-02-23 MED ORDER — ENOXAPARIN SODIUM 40 MG/0.4ML ~~LOC~~ SOLN
40.0000 mg | Freq: Every day | SUBCUTANEOUS | Status: AC
  Administered 2020-02-24 – 2020-04-21 (×23): 40 mg via SUBCUTANEOUS
  Filled 2020-02-23 (×55): qty 0.4

## 2020-02-23 MED ORDER — ALPRAZOLAM 0.5 MG PO TABS
0.5000 mg | ORAL_TABLET | Freq: Two times a day (BID) | ORAL | Status: DC | PRN
  Administered 2020-02-24 – 2020-04-08 (×62): 0.5 mg via ORAL
  Filled 2020-02-23 (×2): qty 1
  Filled 2020-02-23: qty 2
  Filled 2020-02-23 (×28): qty 1
  Filled 2020-02-23: qty 2
  Filled 2020-02-23 (×30): qty 1

## 2020-02-23 MED ORDER — SODIUM CHLORIDE 0.9 % IV SOLN: Freq: Once | INTRAVENOUS | Status: DC

## 2020-02-23 MED ORDER — POTASSIUM CHLORIDE IN NACL 20-0.9 MEQ/L-% IV SOLN
INTRAVENOUS | Status: DC
  Filled 2020-02-23 (×3): qty 1000

## 2020-02-23 MED ORDER — SODIUM CHLORIDE 3 % IV SOLN
INTRAVENOUS | Status: DC
  Filled 2020-02-23 (×2): qty 500

## 2020-02-23 MED ORDER — ACETAMINOPHEN 650 MG RE SUPP: 650.0000 mg | Freq: Four times a day (QID) | RECTAL | Status: DC | PRN

## 2020-02-23 MED ORDER — ONDANSETRON HCL 4 MG PO TABS: 4.0000 mg | ORAL_TABLET | Freq: Four times a day (QID) | ORAL | Status: DC | PRN

## 2020-02-23 MED ORDER — POTASSIUM CHLORIDE 10 MEQ/100ML IV SOLN
10.0000 meq | Freq: Once | INTRAVENOUS | Status: AC
  Administered 2020-02-23: 10 meq via INTRAVENOUS
  Filled 2020-02-23: qty 100

## 2020-02-23 MED ORDER — DIGOXIN 125 MCG PO TABS
0.1250 mg | ORAL_TABLET | Freq: Every day | ORAL | Status: DC
  Administered 2020-02-24 – 2020-06-08 (×106): 0.125 mg via ORAL
  Filled 2020-02-23 (×106): qty 1

## 2020-02-23 MED ORDER — POTASSIUM CHLORIDE 20 MEQ PO PACK
40.0000 meq | PACK | ORAL | Status: AC
  Administered 2020-02-23 – 2020-02-24 (×2): 40 meq via ORAL
  Filled 2020-02-23 (×2): qty 2

## 2020-02-23 MED ORDER — FLUTICASONE FUROATE-VILANTEROL 200-25 MCG/INH IN AEPB
1.0000 | INHALATION_SPRAY | Freq: Every day | RESPIRATORY_TRACT | Status: DC
  Administered 2020-02-26 – 2020-06-07 (×93): 1 via RESPIRATORY_TRACT
  Filled 2020-02-23 (×7): qty 28

## 2020-02-23 MED ORDER — LORAZEPAM 2 MG/ML IJ SOLN
1.0000 mg | Freq: Once | INTRAMUSCULAR | Status: DC
  Filled 2020-02-23: qty 1

## 2020-02-23 MED ORDER — SACUBITRIL-VALSARTAN 24-26 MG PO TABS
1.0000 | ORAL_TABLET | Freq: Two times a day (BID) | ORAL | Status: DC
  Administered 2020-02-24 – 2020-06-08 (×206): 1 via ORAL
  Filled 2020-02-23 (×212): qty 1

## 2020-02-23 MED ORDER — POLYETHYLENE GLYCOL 3350 17 G PO PACK
17.0000 g | PACK | Freq: Every day | ORAL | Status: DC | PRN
  Administered 2020-03-05 – 2020-04-02 (×2): 17 g via ORAL
  Filled 2020-02-23 (×2): qty 1

## 2020-02-23 NOTE — ED Triage Notes (Signed)
Pt. Stated, I keep repeating and really dont want to go on.

## 2020-02-23 NOTE — ED Notes (Signed)
MD notified of Lab Values

## 2020-02-23 NOTE — ED Triage Notes (Signed)
Pt. Stated Hurting myself for about 30 years. I need professional help.

## 2020-02-23 NOTE — ED Provider Notes (Signed)
[facility] EMERGENCY DEPARTMENT Provider Note   CSN: [id number] Arrival date & time: 02/23/20  1028     History Chief Complaint  Patient presents with  . Medical Clearance    Jim Dunn is a 54 y.o. male.  Pt presents to the ED today with SI.  Pt told the nurse that he has been thinking of hurting himself for several years and does not want to go on.  Pt was waiting in the waiting room to be seen.  Pt had a seizure while walking to the bathroom.  He does have a hx of seizures and is on dilantin.  He was also admitted from June 17-19 for hyponatremia.  He left AMA from that admission.  Pt is awake now and said he's been taking his meds.          Past Medical History:  Diagnosis Date  . CHF (congestive heart failure) (HCC)   . COPD (chronic obstructive pulmonary disease) (HCC)   . History of kidney stones   . Paranoid schizophrenia (HCC)   . Seizures (HCC)   . Tobacco abuse     Patient Active Problem List   Diagnosis Date Noted  . Hyponatremia 02/19/2020  . Paranoid schizophrenia (HCC)   . Hypokalemia   . Syncope 01/22/2020  . Chronic systolic CHF (congestive heart failure) (HCC) 01/22/2020  . AKI (acute kidney injury) (HCC) 01/22/2020  . Hyperlipidemia 01/22/2020  . COPD (chronic obstructive pulmonary disease) (HCC) 01/22/2020  . Microcytic anemia 01/22/2020  . Chronic hyponatremia 01/22/2020  . Seizure disorder (HCC) 01/22/2020    Past Surgical History:  Procedure Laterality Date  . KIDNEY STONE SURGERY         Family History  Problem Relation Age of Onset  . Heart disease Other     Social History   Tobacco Use  . Smoking status: Current Every Day Smoker    Packs/day: 1.00    Types: Cigarettes  . Smokeless tobacco: Current User    Types: Chew  Vaping Use  . Vaping Use: Never used  Substance Use Topics  . Alcohol use: Not Currently  . Drug use: Never    Home Medications Prior to Admission medications   Medication Sig  Start Date End Date Taking? Authorizing Provider  acetaminophen (TYLENOL) 325 MG tablet Take 650 mg by mouth every 4 (four) hours as needed for mild pain.  02/08/20  Yes [provider]  albuterol (VENTOLIN HFA) 108 (90 Base) MCG/ACT inhaler Inhale 1-2 puffs into the lungs every 6 (six) hours as needed for wheezing or shortness of breath. 01/19/20  Yes Linwood Dibbles, MD  ALPRAZolam Prudy Feeler) 0.5 MG tablet Take 1 tablet (0.5 mg total) by mouth 2 (two) times daily as needed for anxiety. 01/27/20  Yes Swayze, Ava, DO  atorvastatin (LIPITOR) 40 MG tablet Take 40 mg by mouth daily.  11/10/19  Yes [provider]  budesonide-formoterol (SYMBICORT) 160-4.5 MCG/ACT inhaler Inhale 2 puffs into the lungs 2 (two) times daily.   Yes [provider]  busPIRone (BUSPAR) 10 MG tablet Take 1 tablet (10 mg total) by mouth 3 (three) times daily. 01/27/20  Yes Swayze, Ava, DO  carvedilol (COREG) 3.125 MG tablet Take 1 tablet (3.125 mg total) by mouth 2 (two) times daily with a meal. 01/27/20  Yes Swayze, Ava, DO  digoxin (LANOXIN) 0.125 MG tablet Take 1 tablet (0.125 mg total) by mouth daily. 01/28/20  Yes Swayze, Ava, DO  gabapentin (NEURONTIN) 300 MG capsule Take 300 mg  by mouth in the morning and at bedtime.  01/16/20  Yes [provider]  INVEGA SUSTENNA 234 MG/1.5ML SUSY injection Inject 1.5 mLs into the muscle every 30 (thirty) days. 01/06/20  Yes [provider]  metolazone (ZAROXOLYN) 2.5 MG tablet Take 1 tablet (2.5 mg total) by mouth daily. 01/28/20  Yes Swayze, Ava, DO  phenytoin (DILANTIN) 100 MG ER capsule Take 1 capsule by mouth 3 (three) times daily. 11/10/19  Yes [provider]  potassium chloride (KLOR-CON) 10 MEQ tablet Take 10 mEq by mouth daily.   Yes [provider]  QUEtiapine (SEROQUEL) 300 MG tablet Take 300 mg by mouth daily.  01/12/20  Yes [provider]  sacubitril-valsartan (ENTRESTO) 24-26 MG Take 1 tablet by mouth 2 (two) times daily.  01/27/20  Yes Swayze, Ava, DO  torsemide (DEMADEX) 20 MG tablet Take 1 tablet (20 mg total) by mouth 2 (two) times daily. 01/27/20  Yes Swayze, Ava, DO  arformoterol (BROVANA) 15 MCG/2ML NEBU Take 2 mLs (15 mcg total) by nebulization 2 (two) times daily. Patient not taking: Reported on 02/23/2020 01/27/20   Swayze, Ava, DO  budesonide (PULMICORT) 0.5 MG/2ML nebulizer solution Take 2 mLs (0.5 mg total) by nebulization 2 (two) times daily. Patient not taking: Reported on 02/23/2020 01/27/20   Swayze, Ava, DO  nicotine (NICODERM CQ - DOSED IN MG/24 HOURS) 21 mg/24hr patch Place 1 patch (21 mg total) onto the skin daily. Patient not taking: Reported on 02/23/2020 01/28/20   Swayze, Ava, DO    Allergies    Patient has no known allergies.  Review of Systems   Review of Systems  Neurological: Positive for seizures.  Psychiatric/Behavioral: Positive for suicidal ideas.  All other systems reviewed and are negative.   Physical Exam Updated Vital Signs BP 114/84   Pulse 95   Temp 97.6 F (36.4 C) (Oral)   Resp 15   SpO2 96%   Physical Exam Vitals and nursing note reviewed.  Constitutional:      Appearance: Normal appearance.  HENT:     Head: Normocephalic and atraumatic.     Right Ear: External ear normal.     Left Ear: External ear normal.     Nose: Nose normal.     Mouth/Throat:     Mouth: Mucous membranes are moist.     Pharynx: Oropharynx is clear.  Eyes:     Extraocular Movements: Extraocular movements intact.     Conjunctiva/sclera: Conjunctivae normal.     Pupils: Pupils are equal, round, and reactive to light.  Neck:     Comments: Pt placed in c-collar after he had his seizure Cardiovascular:     Rate and Rhythm: Normal rate and regular rhythm.     Pulses: Normal pulses.     Heart sounds: Normal heart sounds.  Pulmonary:     Effort: Pulmonary effort is normal.     Breath sounds: Normal breath sounds.  Abdominal:     General: Abdomen is flat. Bowel sounds are normal.      Palpations: Abdomen is soft.  Musculoskeletal:        General: Normal range of motion.  Skin:    General: Skin is warm.     Capillary Refill: Capillary refill takes less than 2 seconds.  Neurological:     General: No focal deficit present.     Mental Status: He is alert and oriented to person, place, and time.  Psychiatric:        Thought Content: Thought content includes  suicidal ideation.     ED Results / Procedures / Treatments   Labs (all labs ordered are listed, but only abnormal results are displayed) Labs Reviewed  COMPREHENSIVE METABOLIC PANEL - Abnormal; Notable for the following components:      Result Value   Sodium 117 (*)    Potassium 2.6 (*)    Chloride 77 (*)    Glucose, Bld 102 (*)    Creatinine, Ser 0.53 (*)    Alkaline Phosphatase 294 (*)    Total Bilirubin 1.4 (*)    All other components within normal limits  SALICYLATE LEVEL - Abnormal; Notable for the following components:   Salicylate Lvl <7.0 (*)    All other components within normal limits  ACETAMINOPHEN LEVEL - Abnormal; Notable for the following components:   Acetaminophen (Tylenol), Serum <10 (*)    All other components within normal limits  CBC - Abnormal; Notable for the following components:   WBC 10.9 (*)    RBC 6.45 (*)    Hemoglobin 12.9 (*)    MCV 62.5 (*)    MCH 20.0 (*)    RDW 26.8 (*)    All other components within normal limits  PHENYTOIN LEVEL, TOTAL - Abnormal; Notable for the following components:   Phenytoin Lvl <2.5 (*)    All other components within normal limits  MAGNESIUM - Abnormal; Notable for the following components:   Magnesium 1.4 (*)    All other components within normal limits  SODIUM - Abnormal; Notable for the following components:   Sodium 121 (*)    All other components within normal limits  OSMOLALITY, URINE - Abnormal; Notable for the following components:   Osmolality, Ur 261 (*)    All other components within normal limits  DIGOXIN LEVEL - Abnormal;  Notable for the following components:   Digoxin Level <0.2 (*)    All other components within normal limits  OSMOLALITY - Abnormal; Notable for the following components:   Osmolality 252 (*)    All other components within normal limits  BASIC METABOLIC PANEL - Abnormal; Notable for the following components:   Sodium 121 (*)    Potassium 2.4 (*)    Chloride 77 (*)    Glucose, Bld 100 (*)    Creatinine, Ser 0.59 (*)    All other components within normal limits  SARS CORONAVIRUS 2 BY RT PCR (HOSPITAL ORDER, PERFORMED IN [facility])  ETHANOL  RAPID URINE DRUG SCREEN, HOSP PERFORMED  SODIUM, URINE, RANDOM  URINALYSIS, ROUTINE W REFLEX MICROSCOPIC  SODIUM  SODIUM  CBG MONITORING, ED    EKG EKG Interpretation  Date/Time:  Monday February 23 2020 16:12:35 EDT Ventricular Rate:  96 PR Interval:    QRS Duration: 117 QT Interval:  419 QTC Calculation: 530 R Axis:   135 Text Interpretation: Sinus rhythm Prolonged PR interval Right atrial enlargement Consider left ventricular hypertrophy Nonspecific T abnrm, anterolateral leads Prolonged QT interval No significant change since last tracing Confirmed by Jacalyn Lefevre [phone]) on 02/23/2020 5:03:04 PM   Radiology CT Head Wo Contrast  Result Date: 02/23/2020 CLINICAL DATA:  Generalized weakness. EXAM: CT HEAD WITHOUT CONTRAST TECHNIQUE: Contiguous axial images were obtained from the base of the skull through the vertex without intravenous contrast. COMPARISON:  February 19, 2020 FINDINGS: Brain: No evidence of acute infarction, hemorrhage, hydrocephalus, extra-axial collection or mass lesion/mass effect. A stable, approximately 5.5 cm x 4.6 cm area of CSF attenuation is seen within the right occipital lobe. There is no  evidence of associated mass effect or midline shift. Vascular: No hyperdense vessel or unexpected calcification. Skull: Normal. Negative for fracture or focal lesion. Sinuses/Orbits: No acute finding. Other: None.  IMPRESSION: 1. No acute intracranial abnormality. 2. Stable right occipital lobe arachnoid cyst. Electronically Signed   By: Aram Candela M.D.   On: 02/23/2020 18:34   CT Cervical Spine Wo Contrast  Result Date: 02/23/2020 CLINICAL DATA:  Generalized weakness. EXAM: CT CERVICAL SPINE WITHOUT CONTRAST TECHNIQUE: Multidetector CT imaging of the cervical spine was performed without intravenous contrast. Multiplanar CT image reconstructions were also generated. COMPARISON:  None. FINDINGS: Alignment: Normal. Skull base and vertebrae: No acute fracture. No primary bone lesion or focal pathologic process. Soft tissues and spinal canal: No prevertebral fluid or swelling. No visible canal hematoma. Disc levels: Moderate severity multilevel endplate sclerosis is seen. Moderate severity intervertebral disc space narrowing is seen at the level of C3-C4. Mild multilevel intervertebral disc space narrowing is seen throughout the remainder of the cervical spine. Normal bilateral multilevel facet joints are seen. Upper chest: Negative. Other: None. IMPRESSION: Multilevel degenerative disc disease of the cervical spine, most prominent at the level of C3-C4. Electronically Signed   By: Aram Candela M.D.   On: 02/23/2020 18:38   DG Chest Portable 1 View  Result Date: 02/23/2020 CLINICAL DATA:  Seizure today. No chest symptoms. Pt states wanting to hurt his selfseizure EXAM: PORTABLE CHEST 1 VIEW COMPARISON:  None. FINDINGS: Stable mild enlarged cardiac silhouette. Low lung volumes. No focal infiltrate. No pneumothorax. No pulmonary edema. No acute osseous abnormality. IMPRESSION: 1. Low lung volumes. 2. No acute findings. Electronically Signed   By: Genevive Bi M.D.   On: 02/23/2020 18:16    Procedures Procedures (including critical care time)  Medications Ordered in ED Medications  LORazepam (ATIVAN) injection 1 mg (has no administration in time range)  0.9 %  sodium chloride infusion (has no  administration in time range)  potassium chloride 10 mEq in 100 mL IVPB (0 mEq Intravenous Stopped 02/23/20 1732)  magnesium sulfate IVPB 2 g 50 mL (0 g Intravenous Stopped 02/23/20 2006)  fosPHENYtoin (CEREBYX) 1,236 mg PE in sodium chloride 0.9 % 50 mL IVPB (0 mg PE/kg  82.4 kg Intravenous Stopped 02/23/20 1926)  potassium chloride 10 mEq in 100 mL IVPB (10 mEq Intravenous New Bag/Given 02/23/20 2038)    ED Course  I have reviewed the triage vital signs and the nursing notes.  Pertinent labs & imaging results that were available during my care of the patient were reviewed by me and considered in my medical decision making (see chart for details).    MDM Rules/Calculators/A&P                          Seizure is likely multifactorial.  His sodium is 117 which is down from his last at 132 on 6/19.  His dilantin level was only 2.8 on 6/19.  Due to a seizure with a sodium of 117, I was going to start him on 3% NaCl, but it never got started.  Pt's sodium went up on its own to 121.  Pt d/w Dr. Arsenio Loader (ICU) who recommends regular NS at 75-100 cc/hr and repeat Na in 4 hrs.  Pt ok for step down.  K is low and is replaced.    Mg is low and is replaced.  Dilantin level is low and is replaced.  C-collar removed after CT came back nl and pt is  awake and has no neck pain.  Pt d/w Dr. Leafy Half (triad) for admission.   CRITICAL CARE Performed by: Jacalyn Lefevre   Total critical care time: 60 minutes  Critical care time was exclusive of separately billable procedures and treating other patients.  Critical care was necessary to treat or prevent imminent or life-threatening deterioration.  Critical care was time spent personally by me on the following activities: development of treatment plan with patient and/or surrogate as well as nursing, discussions with consultants, evaluation of patient's response to treatment, examination of patient, obtaining history from patient or surrogate, ordering  and performing treatments and interventions, ordering and review of laboratory studies, ordering and review of radiographic studies, pulse oximetry and re-evaluation of patient's condition.  Final Clinical Impression(s) / ED Diagnoses Final diagnoses:  Hyponatremia  Hypokalemia  Hypomagnesemia  Noncompliance with medications  Subtherapeutic serum dilantin level  Seizure [facility])    Rx / DC Orders ED Discharge Orders    None       Jacalyn Lefevre, MD 02/23/20 2216

## 2020-02-23 NOTE — ED Notes (Signed)
Informed by lobby staff that patient seized while walking to bathroom. Brought back to treatment room. Patient is alert and oriented at this time. Dr Particia Nearing at bedside

## 2020-02-23 NOTE — H&P (Signed)
History and Physical    Jim Dunn LXB:[id number] DOB: 1966/08/17 DOA: 02/23/2020  PCP: System, Pcp Not In  Patient coming from: Home   Chief Complaint:   Suicidal ideation  HPI:    54 year old male with past medical history of schizophrenia, seizure disorder, biventricular heart failure (Echo 01/2020 EF 25-30% with right-sided heart failure), severe tricuspid regurgitation, nicotine dependence and COPD who presents to [facility] emergency department with suicidal ideation and seizure.  Patient is an extremely poor historian.  Of note, patient was just admitted to our facility from 6/17-6/19.  During this hospitalization patient was being managed for substantial hyponatremia, thought to be multifactorial in origin.  Patient left AGAINST MEDICAL ADVICE in 6/19 and that time had sodium of 132.  Patient explains that since he left the hospital several days ago he has been experiencing increasing fatigue.  This increasing fatigue is associated with bilateral lower extremity tingling.  Symptoms are similar to what prompted his presentation to our facility on 6/17.  Upon further questioning patient denies chest pain, shortness of breath, lightheadedness, cough, fever, sick contacts, recent travel or contacts with confirmed COVID-19 infection.  Upon further questioning patient admits to not having taken his meds for at least the past 2 days.  In the past 24 hours, patient has additionally been experiencing suicidal ideation.  Patient states that he has been thinking of ending his life but he does not have a plan.  Patient reports that he has attempted suicide in the distant past using pills but was unsuccessful and never sought medical attention.  Patient proceeded to present to [facility] emergency department for evaluation.  While the patient was waiting in the waiting room, he experienced a witnessed seizure while attempting to walk to the bathroom.  Patient was immediately  brought back to the emergency department for further evaluation.  Patient was found to have substantial recurrent hyponatremia of 117 with concurrent hypokalemia of 2.6 and hypomagnesium of 1.4.  Patient was administered 1236 mg of intravenous Fosphenytoin and initiated on low rate normal saline infusion.  Patient was administered 2 g of intravenous magnesium and a 30 mEq of potassium.  Hospitalist group was then called to assess patient for admission the hospital.    Review of Systems: A 10-system review of systems has been performed and all systems are negative with the exception of what is listed in the HPI.  Review of systems limited by patient being a poor historian.   Past Medical History:  Diagnosis Date  . CHF (congestive heart failure) (HCC)   . COPD (chronic obstructive pulmonary disease) (HCC)   . History of kidney stones   . Paranoid schizophrenia (HCC)   . Seizures (HCC)   . Tobacco abuse     Past Surgical History:  Procedure Laterality Date  . KIDNEY STONE SURGERY       reports that he has been smoking cigarettes. He has been smoking about 1.00 pack per day. His smokeless tobacco use includes chew. He reports previous alcohol use. He reports that he does not use drugs.  No Known Allergies  Family History  Problem Relation Age of Onset  . Heart disease Other      Prior to Admission medications   Medication Sig Start Date End Date Taking? Authorizing Provider  acetaminophen (TYLENOL) 325 MG tablet Take 650 mg by mouth every 4 (four) hours as needed for mild pain.  02/08/20  Yes [provider]  albuterol (VENTOLIN HFA) 108 (90 Base) MCG/ACT  inhaler Inhale 1-2 puffs into the lungs every 6 (six) hours as needed for wheezing or shortness of breath. 01/19/20  Yes Linwood Dibbles, MD  ALPRAZolam Prudy Feeler) 0.5 MG tablet Take 1 tablet (0.5 mg total) by mouth 2 (two) times daily as needed for anxiety. 01/27/20  Yes Swayze, Ava, DO  atorvastatin (LIPITOR) 40 MG tablet Take 40 mg  by mouth daily.  11/10/19  Yes [provider]  budesonide-formoterol (SYMBICORT) 160-4.5 MCG/ACT inhaler Inhale 2 puffs into the lungs 2 (two) times daily.   Yes [provider]  busPIRone (BUSPAR) 10 MG tablet Take 1 tablet (10 mg total) by mouth 3 (three) times daily. 01/27/20  Yes Swayze, Ava, DO  carvedilol (COREG) 3.125 MG tablet Take 1 tablet (3.125 mg total) by mouth 2 (two) times daily with a meal. 01/27/20  Yes Swayze, Ava, DO  digoxin (LANOXIN) 0.125 MG tablet Take 1 tablet (0.125 mg total) by mouth daily. 01/28/20  Yes Swayze, Ava, DO  gabapentin (NEURONTIN) 300 MG capsule Take 300 mg by mouth in the morning and at bedtime.  01/16/20  Yes [provider]  INVEGA SUSTENNA 234 MG/1.5ML SUSY injection Inject 1.5 mLs into the muscle every 30 (thirty) days. 01/06/20  Yes [provider]  metolazone (ZAROXOLYN) 2.5 MG tablet Take 1 tablet (2.5 mg total) by mouth daily. 01/28/20  Yes Swayze, Ava, DO  phenytoin (DILANTIN) 100 MG ER capsule Take 1 capsule by mouth 3 (three) times daily. 11/10/19  Yes [provider]  potassium chloride (KLOR-CON) 10 MEQ tablet Take 10 mEq by mouth daily.   Yes [provider]  QUEtiapine (SEROQUEL) 300 MG tablet Take 300 mg by mouth daily.  01/12/20  Yes [provider]  sacubitril-valsartan (ENTRESTO) 24-26 MG Take 1 tablet by mouth 2 (two) times daily. 01/27/20  Yes Swayze, Ava, DO  torsemide (DEMADEX) 20 MG tablet Take 1 tablet (20 mg total) by mouth 2 (two) times daily. 01/27/20  Yes Swayze, Ava, DO  arformoterol (BROVANA) 15 MCG/2ML NEBU Take 2 mLs (15 mcg total) by nebulization 2 (two) times daily. Patient not taking: Reported on 02/23/2020 01/27/20   Swayze, Ava, DO  budesonide (PULMICORT) 0.5 MG/2ML nebulizer solution Take 2 mLs (0.5 mg total) by nebulization 2 (two) times daily. Patient not taking: Reported on 02/23/2020 01/27/20   Swayze, Ava, DO  nicotine (NICODERM CQ - DOSED IN MG/24 HOURS) 21 mg/24hr patch  Place 1 patch (21 mg total) onto the skin daily. Patient not taking: Reported on 02/23/2020 01/28/20   Fran Lowes, DO    Physical Exam: Vitals:   02/23/20 2005 02/23/20 2010 02/23/20 2015 02/23/20 2223  BP:   114/84 99/69  Pulse: 92 91 95 90  Resp:    14  Temp:      TempSrc:      SpO2: 95% 97% 96% 95%    Constitutional: Acute alert and oriented x3, no associated distress.   Skin: no rashes, no lesions, good skin turgor noted. Eyes: Pupils are equally reactive to light.  No evidence of scleral icterus or conjunctival pallor.  ENMT: Moist mucous membranes noted.  Posterior pharynx clear of any exudate or lesions.   Neck: normal, supple, no masses, no thyromegaly.  No evidence of jugular venous distension.   Respiratory: clear to auscultation bilaterally, no wheezing, no crackles. Normal respiratory effort. No accessory muscle use.  Cardiovascular: Regular rate and rhythm, no murmurs / rubs / gallops. No extremity edema. 2+ pedal pulses. No carotid bruits.  Chest:  Nontender without crepitus or deformity.   Back:   Nontender without crepitus or deformity. Abdomen: Abdomen is soft and nontender.  No evidence of intra-abdominal masses.  Positive bowel sounds noted in all quadrants.   Musculoskeletal: No joint deformity upper and lower extremities. Good ROM, no contractures. Normal muscle tone.  Neurologic: CN 2-12 grossly intact. Sensation intact, strength noted to be 5 out of 5 in all 4 extremities.  Patient is following all commands.  Patient is responsive to verbal stimuli.   Psychiatric: Patient presents as a normal mood with flat affect.  Patient currently contracts for safety and states that he is no longer having suicidal ideation.  Patient states that he is willing to stay and receive medical care.  Patient seems to possess insight as to theircurrent situation.     Labs on Admission: I have personally reviewed following labs and imaging studies -   CBC: Recent Labs  Lab  02/19/20 2058 02/20/20 0457 02/23/20 1106  WBC 8.7 7.8 10.9*  NEUTROABS 4.1  --   --   HGB 12.6* 12.7* 12.9*  HCT 40.5 39.7 40.3  MCV 63.8* 62.6* 62.5*  PLT 287 280 243   Basic Metabolic Panel: Recent Labs  Lab 02/19/20 2058 02/19/20 2058 02/19/20 2247 02/20/20 0457 02/21/20 0334 02/23/20 1106 02/23/20 1544 02/23/20 1910  NA 124*   < >  --  128* 132* 117* 121* 121*  K 2.8*  --   --  2.9* 3.4* 2.6*  --  2.4*  CL 79*  --   --  84* 87* 77*  --  77*  CO2 31  --   --  29 33* 25  --  30  GLUCOSE 94  --   --  85 101* 102*  --  100*  BUN 25*  --   --  27* 20 15  --  12  CREATININE 1.05  --   --  0.78 0.63 0.53*  --  0.59*  CALCIUM 9.2  --   --  9.1 9.4 9.3  --  9.3  MG  --   --  1.5* 1.5* 2.1  --  1.4*  --    < > = values in this interval not displayed.   GFR: Estimated Creatinine Clearance: 113.1 mL/min (A) (by C-G formula based on SCr of 0.59 mg/dL (L)). Liver Function Tests: Recent Labs  Lab 02/19/20 2058 02/20/20 0457 02/23/20 1106  AST 33 29 31  ALT 24 22 21   ALKPHOS 299* 289* 294*  BILITOT 1.0 0.9 1.4*  PROT 7.5 7.3 7.7  ALBUMIN 3.5 3.5 3.6   No results for input(s): LIPASE, AMYLASE in the last 168 hours. No results for input(s): AMMONIA in the last 168 hours. Coagulation Profile: Recent Labs  Lab 02/19/20 2058  INR 1.1   Cardiac Enzymes: No results for input(s): CKTOTAL, CKMB, CKMBINDEX, TROPONINI in the last 168 hours. BNP (last 3 results) No results for input(s): PROBNP in the last 8760 hours. HbA1C: No results for input(s): HGBA1C in the last 72 hours. CBG: Recent Labs  Lab 02/19/20 2100  GLUCAP 102*   Lipid Profile: No results for input(s): CHOL, HDL, LDLCALC, TRIG, CHOLHDL, LDLDIRECT in the last 72 hours. Thyroid Function Tests: No results for input(s): TSH, T4TOTAL, FREET4, T3FREE, THYROIDAB in the last 72 hours. Anemia Panel: No results for input(s): VITAMINB12, FOLATE, FERRITIN, TIBC, IRON, RETICCTPCT in the last 72 hours. Urine  analysis:    Component Value Date/Time   COLORURINE YELLOW 02/23/2020 1910  APPEARANCEUR CLEAR 02/23/2020 1910   LABSPEC 1.006 02/23/2020 1910   PHURINE 7.0 02/23/2020 1910   GLUCOSEU NEGATIVE 02/23/2020 1910   HGBUR NEGATIVE 02/23/2020 1910   BILIRUBINUR NEGATIVE 02/23/2020 1910   KETONESUR NEGATIVE 02/23/2020 1910   PROTEINUR NEGATIVE 02/23/2020 1910   NITRITE NEGATIVE 02/23/2020 1910   LEUKOCYTESUR NEGATIVE 02/23/2020 1910    Radiological Exams on Admission - Personally Reviewed: CT Head Wo Contrast  Result Date: 02/23/2020 CLINICAL DATA:  Generalized weakness. EXAM: CT HEAD WITHOUT CONTRAST TECHNIQUE: Contiguous axial images were obtained from the base of the skull through the vertex without intravenous contrast. COMPARISON:  February 19, 2020 FINDINGS: Brain: No evidence of acute infarction, hemorrhage, hydrocephalus, extra-axial collection or mass lesion/mass effect. A stable, approximately 5.5 cm x 4.6 cm area of CSF attenuation is seen within the right occipital lobe. There is no evidence of associated mass effect or midline shift. Vascular: No hyperdense vessel or unexpected calcification. Skull: Normal. Negative for fracture or focal lesion. Sinuses/Orbits: No acute finding. Other: None. IMPRESSION: 1. No acute intracranial abnormality. 2. Stable right occipital lobe arachnoid cyst. Electronically Signed   By: Aram Candela M.D.   On: 02/23/2020 18:34   CT Cervical Spine Wo Contrast  Result Date: 02/23/2020 CLINICAL DATA:  Generalized weakness. EXAM: CT CERVICAL SPINE WITHOUT CONTRAST TECHNIQUE: Multidetector CT imaging of the cervical spine was performed without intravenous contrast. Multiplanar CT image reconstructions were also generated. COMPARISON:  None. FINDINGS: Alignment: Normal. Skull base and vertebrae: No acute fracture. No primary bone lesion or focal pathologic process. Soft tissues and spinal canal: No prevertebral fluid or swelling. No visible canal hematoma. Disc  levels: Moderate severity multilevel endplate sclerosis is seen. Moderate severity intervertebral disc space narrowing is seen at the level of C3-C4. Mild multilevel intervertebral disc space narrowing is seen throughout the remainder of the cervical spine. Normal bilateral multilevel facet joints are seen. Upper chest: Negative. Other: None. IMPRESSION: Multilevel degenerative disc disease of the cervical spine, most prominent at the level of C3-C4. Electronically Signed   By: Aram Candela M.D.   On: 02/23/2020 18:38   DG Chest Portable 1 View  Result Date: 02/23/2020 CLINICAL DATA:  Seizure today. No chest symptoms. Pt states wanting to hurt his selfseizure EXAM: PORTABLE CHEST 1 VIEW COMPARISON:  None. FINDINGS: Stable mild enlarged cardiac silhouette. Low lung volumes. No focal infiltrate. No pneumothorax. No pulmonary edema. No acute osseous abnormality. IMPRESSION: 1. Low lung volumes. 2. No acute findings. Electronically Signed   By: Genevive Bi M.D.   On: 02/23/2020 18:16    EKG: Personally reviewed.  Rhythm is normal sinus rhythm with heart rate of 96 bpm.  Notable prolonged QTc interval of 530 no dynamic ST segment changes appreciated.  Assessment/Plan Principal Problem:   Hyponatremia   Patient presenting with substantial hyponatremia that has developed essentially in the past 2-3 days since he left AMA on 6/19  While patient's recurrent hyponatremia is relatively rapid in onset it technically is still chronic hyponatremia -  developing over 48 hours -and therefore will be managed as such  Slow measured intravenous normal saline infusion  Frequent chemistries to ensure slow measured correction of sodium levels with target sodium correction of 8-10 in 24 hours.  Concerning etiology, based on review of serum osmolality, urine osmolality and serum sodium numbers are suggestive of a hypovolemic hyponatremia  Temporarily holding diuretics  While some of patient's psychogenic  medications such as Seroquel can occasionally cause hyponatremia this is unlikely to be the case  here.  Patient denies symptoms suggestive of psychogenic polydipsia  Patient denies heavy alcohol use  Clinically patient does not seem to be volume overloaded / in acute congestive heart failure  Active Problems:   Seizure disorder (HCC)   Development of seizure in the waiting room is likely secondary to substantial hyponatremia in the setting of known seizure disorder and several day noncompliance with Dilantin regimen  No further seizure activity  Patient is already status post intravenous fosphenytoin infusion in the emergency department  Sodium levels uptrending   I have restarted patient's home regimen of oral Dilantin  As needed Ativan for recurrent seizure  If patient develops recurrent seizure will consult neurology    Suicidal ideation   Patient currently states that he is no longer having suicidal ideation and currently contracts for safety.  Patient states that he currently does not have a regular psychiatrist despite his known history of schizophrenia and his reported history of suicidal ideation  As medical conditions resolve, will consult psychiatry in the morning  I do not believe that the patient needs to be involuntarily committed at this time.    Chronic systolic CHF (congestive heart failure) (HCC)   No clinical evidence of volume overload  Temporarily holding diuretics  Continue Entresto, Coreg as blood pressure tolerates    COPD (chronic obstructive pulmonary disease) (HCC)   No evidence of COPD exacerbation  As needed bronchodilator therapy    Paranoid schizophrenia (HCC)   Continue home regimen of BuSpar, Seroquel, outpatient Invega injections    Hypokalemia   Replacing via both intravenous and oral means  Monitoring with serial chemistries    Hypomagnesemia   Replacing with intravenous magnesium sulfate  Monitoring with serial  chemistries    Mixed hyperlipidemia    Continue home regimen of statin therapy   Code Status:  Full code Family Communication: deferred   Status is: Inpatient  Remains inpatient appropriate because:Persistent severe electrolyte disturbances and IV treatments appropriate due to intensity of illness or inability to take PO   Dispo: The patient is from: Home              Anticipated d/c is to: Home              Anticipated d/c date is: > 3 days              Patient currently is not medically stable to d/c.        Vernelle Emerald MD [facility] Pager [phone]  If 7PM-7AM, please contact night-coverage [email or web address] Use universal [place] password for that web site. If you do not have the password, please call the hospital operator.  02/23/2020, 11:03 PM

## 2020-02-24 DIAGNOSIS — R569 Unspecified convulsions: Secondary | ICD-10-CM

## 2020-02-24 DIAGNOSIS — Z9114 Patient's other noncompliance with medication regimen: Secondary | ICD-10-CM

## 2020-02-24 LAB — BASIC METABOLIC PANEL
Anion gap: 12 (ref 5–15)
Anion gap: 13 (ref 5–15)
Anion gap: 12 (ref 5–15)
BUN: 10 mg/dL (ref 6–20)
BUN: 9 mg/dL (ref 6–20)
BUN: 10 mg/dL (ref 6–20)
CO2: 29 mmol/L (ref 22–32)
CO2: 26 mmol/L (ref 22–32)
CO2: 26 mmol/L (ref 22–32)
Calcium: 8.8 mg/dL — ABNORMAL LOW (ref 8.9–10.3)
Calcium: 9.1 mg/dL (ref 8.9–10.3)
Calcium: 8.7 mg/dL — ABNORMAL LOW (ref 8.9–10.3)
Chloride: 83 mmol/L — ABNORMAL LOW (ref 98–111)
Chloride: 86 mmol/L — ABNORMAL LOW (ref 98–111)
Chloride: 86 mmol/L — ABNORMAL LOW (ref 98–111)
Creatinine, Ser: 0.48 mg/dL — ABNORMAL LOW (ref 0.61–1.24)
Creatinine, Ser: 0.58 mg/dL — ABNORMAL LOW (ref 0.61–1.24)
Creatinine, Ser: 0.49 mg/dL — ABNORMAL LOW (ref 0.61–1.24)
GFR calc Af Amer: >60 mL/min (ref >60)
GFR calc Af Amer: >60 mL/min (ref >60)
GFR calc Af Amer: >60 mL/min (ref >60)
GFR calc non Af Amer: >60 mL/min (ref >60)
GFR calc non Af Amer: >60 mL/min (ref >60)
GFR calc non Af Amer: >60 mL/min (ref >60)
Glucose, Bld: 89 mg/dL (ref 70–99)
Glucose, Bld: 87 mg/dL (ref 70–99)
Glucose, Bld: 110 mg/dL — ABNORMAL HIGH (ref 70–99)
Potassium: 2.8 mmol/L — ABNORMAL LOW (ref 3.5–5.1)
Potassium: 3 mmol/L — ABNORMAL LOW (ref 3.5–5.1)
Potassium: 4.9 mmol/L (ref 3.5–5.1)
Sodium: 124 mmol/L — ABNORMAL LOW (ref 135–145)
Sodium: 125 mmol/L — ABNORMAL LOW (ref 135–145)
Sodium: 124 mmol/L — ABNORMAL LOW (ref 135–145)

## 2020-02-24 LAB — COMPREHENSIVE METABOLIC PANEL
ALT: 20 U/L (ref 0–44)
AST: 37 U/L (ref 15–41)
Albumin: 3.3 g/dL — ABNORMAL LOW (ref 3.5–5.0)
Alkaline Phosphatase: 266 U/L — ABNORMAL HIGH (ref 38–126)
Anion gap: 11 (ref 5–15)
BUN: 11 mg/dL (ref 6–20)
CO2: 28 mmol/L (ref 22–32)
Calcium: 8.9 mg/dL (ref 8.9–10.3)
Chloride: 85 mmol/L — ABNORMAL LOW (ref 98–111)
Creatinine, Ser: 0.56 mg/dL — ABNORMAL LOW (ref 0.61–1.24)
GFR calc Af Amer: >60 mL/min (ref >60)
GFR calc non Af Amer: >60 mL/min (ref >60)
Glucose, Bld: 80 mg/dL (ref 70–99)
Potassium: 4.2 mmol/L (ref 3.5–5.1)
Sodium: 124 mmol/L — ABNORMAL LOW (ref 135–145)
Total Bilirubin: 1.2 mg/dL (ref 0.3–1.2)
Total Protein: 7 g/dL (ref 6.5–8.1)

## 2020-02-24 LAB — CBC WITH DIFFERENTIAL/PLATELET
Abs Immature Granulocytes: 0.05 10*3/uL (ref 0.00–0.07)
Basophils Absolute: 0 10*3/uL (ref 0.0–0.1)
Basophils Relative: 0 %
Eosinophils Absolute: 0.2 10*3/uL (ref 0.0–0.5)
Eosinophils Relative: 2 %
HCT: 39.2 % (ref 39.0–52.0)
Hemoglobin: 12.5 g/dL — ABNORMAL LOW (ref 13.0–17.0)
Immature Granulocytes: 1 %
Lymphocytes Relative: 30 %
Lymphs Abs: 2.3 10*3/uL (ref 0.7–4.0)
MCH: 20 pg — ABNORMAL LOW (ref 26.0–34.0)
MCHC: 31.9 g/dL (ref 30.0–36.0)
MCV: 62.6 fL — ABNORMAL LOW (ref 80.0–100.0)
Monocytes Absolute: 1 10*3/uL (ref 0.1–1.0)
Monocytes Relative: 13 %
Neutro Abs: 4.2 10*3/uL (ref 1.7–7.7)
Neutrophils Relative %: 54 %
Platelets: 215 10*3/uL (ref 150–400)
RBC: 6.26 MIL/uL — ABNORMAL HIGH (ref 4.22–5.81)
RDW: 26.5 % — ABNORMAL HIGH (ref 11.5–15.5)
WBC: 7.9 10*3/uL (ref 4.0–10.5)
nRBC: 0 % (ref 0.0–0.2)

## 2020-02-24 LAB — MAGNESIUM
Magnesium: 1.7 mg/dL (ref 1.7–2.4)
Magnesium: 1.8 mg/dL (ref 1.7–2.4)

## 2020-02-24 LAB — BRAIN NATRIURETIC PEPTIDE: B Natriuretic Peptide: 184.7 pg/mL — ABNORMAL HIGH (ref 0.0–100.0)

## 2020-02-24 MED ORDER — POTASSIUM CHLORIDE CRYS ER 20 MEQ PO TBCR
40.0000 meq | EXTENDED_RELEASE_TABLET | Freq: Two times a day (BID) | ORAL | Status: AC
  Administered 2020-02-24 (×2): 40 meq via ORAL
  Filled 2020-02-24 (×2): qty 2

## 2020-02-24 MED ORDER — ARFORMOTEROL TARTRATE 15 MCG/2ML IN NEBU
15.0000 ug | INHALATION_SOLUTION | Freq: Two times a day (BID) | RESPIRATORY_TRACT | Status: DC
  Administered 2020-02-24: 15 ug via RESPIRATORY_TRACT
  Filled 2020-02-24 (×3): qty 2

## 2020-02-24 MED ORDER — SODIUM CHLORIDE 0.9 % IV SOLN: INTRAVENOUS | Status: AC

## 2020-02-24 MED ORDER — ARFORMOTEROL TARTRATE 15 MCG/2ML IN NEBU
15.0000 ug | INHALATION_SOLUTION | Freq: Two times a day (BID) | RESPIRATORY_TRACT | Status: DC
  Administered 2020-02-25 – 2020-02-26 (×3): 15 ug via RESPIRATORY_TRACT
  Filled 2020-02-24 (×3): qty 2

## 2020-02-24 MED ORDER — MAGNESIUM OXIDE 400 (241.3 MG) MG PO TABS
400.0000 mg | ORAL_TABLET | Freq: Two times a day (BID) | ORAL | Status: AC
  Administered 2020-02-24 (×2): 400 mg via ORAL
  Filled 2020-02-24 (×2): qty 1

## 2020-02-24 NOTE — Progress Notes (Signed)
[facility] PROGRESS NOTE    Progress Note  Jim Dunn  XJO:[id number] DOB: 12-06-1965 DOA: 02/23/2020 PCP: System, Pcp Not In     Brief Narrative:   Jediel Jimison is an 54 y.o. 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  Assessment/Plan:   Hypotonic hypovolemic hyponatremia: Serum osmolarity is 252 his urine is 261, he was recently discharged for hyponatremia which has developed over the last 48 hours. Patient relates lightheadedness upon standing and actually falling but not lose consciousness, he relates he was drinking alcohol and was also taking his torsemide and metolazone. He clearly describes dizziness upon standing, will check orthostatics continue IV fluids, will try to correct sodium no more than 14 mg in a 24-hour.  Recheck BMP to every 4. Monitor check I's and O's and daily weights, he relates his last drink was 4 days prior to admission.  Seizure disorder: Question due to hyponatremia and noncompliance of his Dilantin. He was given a loading IV infusion of fosphenytoin. Sodium is up trending.  Start him on his oral Dilantin Ativan as needed for seizures.  Suicidal ideation: He states he has suicidal thoughts because some people were bothering him and telling him he was going to die he does not have a plan He is not a threat to himself or others, he relates he thought about this briefly. Awaiting psych recommendations.  Chronic systolic CHF (congestive heart failure) (HCC) He appears dry on physical exam hold both diuretic therapy.  Continue Entresto Coreg.  His blood pressure is stable.  COPD: Not on oxygen continue Brovana and other inhalers.  Paranoid schizophrenia: Resume BuSpar, continue Seroquel. Awaiting psych recommendations.  Hypokalemia: Likely due to  dehydration he was started on normal saline with potassium supplementation and his potassium has resolved. We will change his fluids to normal saline infusion at 75 for 12 hours recheck Bumex every 4.  Hypomagnesemia Due to alcohol abuse replete orally recheck in the morning.   DVT prophylaxis: lovenxo Family Communication:none Status is: Inpatient  Remains inpatient appropriate because:Hemodynamically unstable   Dispo: The patient is from: Home              Anticipated d/c is to: Home              Anticipated d/c date is: 3 days              Patient currently is not medically stable to d/c.        Code Status:     Code Status Orders  (From admission, onward)         Start     Ordered   02/23/20 2302  Full code  Continuous        02/23/20 2301        Code Status History    Date Active Date Inactive Code Status Order ID Comments User Context   02/19/2020 2344 02/21/2020 1252 Full Code [id number]  Briscoe Deutscher, MD ED   01/22/2020 0758 01/28/2020 0134 Full Code [id number]  Clydie Braun, MD ED   Advance Care Planning Activity        IV Access:    Peripheral IV   Procedures and diagnostic studies:   CT Head Wo Contrast  Result Date: 02/23/2020 CLINICAL DATA:  Generalized weakness. EXAM: CT HEAD WITHOUT CONTRAST TECHNIQUE: Contiguous axial  images were obtained from the base of the skull through the vertex without intravenous contrast. COMPARISON:  February 19, 2020 FINDINGS: Brain: No evidence of acute infarction, hemorrhage, hydrocephalus, extra-axial collection or mass lesion/mass effect. A stable, approximately 5.5 cm x 4.6 cm area of CSF attenuation is seen within the right occipital lobe. There is no evidence of associated mass effect or midline shift. Vascular: No hyperdense vessel or unexpected calcification. Skull: Normal. Negative for fracture or focal lesion. Sinuses/Orbits: No acute finding. Other: None. IMPRESSION: 1. No acute intracranial abnormality. 2.  Stable right occipital lobe arachnoid cyst. Electronically Signed   By: Aram Candela M.D.   On: 02/23/2020 18:34   CT Cervical Spine Wo Contrast  Result Date: 02/23/2020 CLINICAL DATA:  Generalized weakness. EXAM: CT CERVICAL SPINE WITHOUT CONTRAST TECHNIQUE: Multidetector CT imaging of the cervical spine was performed without intravenous contrast. Multiplanar CT image reconstructions were also generated. COMPARISON:  None. FINDINGS: Alignment: Normal. Skull base and vertebrae: No acute fracture. No primary bone lesion or focal pathologic process. Soft tissues and spinal canal: No prevertebral fluid or swelling. No visible canal hematoma. Disc levels: Moderate severity multilevel endplate sclerosis is seen. Moderate severity intervertebral disc space narrowing is seen at the level of C3-C4. Mild multilevel intervertebral disc space narrowing is seen throughout the remainder of the cervical spine. Normal bilateral multilevel facet joints are seen. Upper chest: Negative. Other: None. IMPRESSION: Multilevel degenerative disc disease of the cervical spine, most prominent at the level of C3-C4. Electronically Signed   By: Aram Candela M.D.   On: 02/23/2020 18:38   DG Chest Portable 1 View  Result Date: 02/23/2020 CLINICAL DATA:  Seizure today. No chest symptoms. Pt states wanting to hurt his selfseizure EXAM: PORTABLE CHEST 1 VIEW COMPARISON:  None. FINDINGS: Stable mild enlarged cardiac silhouette. Low lung volumes. No focal infiltrate. No pneumothorax. No pulmonary edema. No acute osseous abnormality. IMPRESSION: 1. Low lung volumes. 2. No acute findings. Electronically Signed   By: Genevive Bi M.D.   On: 02/23/2020 18:16     Medical Consultants:    None.  Anti-Infectives:   none  Subjective:    [name] he relates he is thirsty and hungry.  Objective:    Vitals:   02/24/20 0436 02/24/20 0526 02/24/20 0542 02/24/20 0633  BP:  112/73 126/73 101/72  Pulse:  81 82 99    Resp: 11 10 11 18   Temp:      TempSrc:      SpO2:   96% 98%   SpO2: 98 %   Intake/Output Summary (Last 24 hours) at 02/24/2020 0738 Last data filed at 02/24/2020 0426 Gross per 24 hour  Intake --  Output 2300 ml  Net -2300 ml   There were no vitals filed for this visit.  Exam: General exam: In no acute distress. Respiratory system: Good air movement and clear to auscultation. Cardiovascular system: S1 & S2 heard, RRR. No JVD. Gastrointestinal system: Abdomen is nondistended, soft and nontender.  Central nervous system: Alert and oriented. No focal neurological deficits. Extremities: No pedal edema. Skin: No rashes, lesions or ulcers Psychiatry: Judgement and insight appear normal. Mood & affect appropriate.  He no longer has suicidal thoughts he did not have a plan to commit suicide hurt himself or others   Data Reviewed:    Labs: Basic Metabolic Panel: Recent Labs  Lab 02/20/20 0457 02/20/20 0457 02/21/20 0334 02/21/20 0334 02/23/20 1106 02/23/20 1106 02/23/20 1544 02/23/20 1910 02/23/20 1910 02/24/20 0030 02/24/20 02/26/20  NA 128*   < > 132*   < > 117*  --  121* 121*  --  124* 124*  K 2.9*   < > 3.4*   < > 2.6*   < >  --  2.4*   < > 2.8* 4.2  CL 84*   < > 87*  --  77*  --   --  77*  --  83* 85*  CO2 29   < > 33*  --  25  --   --  30  --  29 28  GLUCOSE 85   < > 101*  --  102*  --   --  100*  --  89 80  BUN 27*   < > 20  --  15  --   --  12  --  10 11  CREATININE 0.78   < > 0.63  --  0.53*  --   --  0.59*  --  0.48* 0.56*  CALCIUM 9.1   < > 9.4  --  9.3  --   --  9.3  --  8.8* 8.9  MG 1.5*  --  2.1  --   --   --  1.4*  --   --  1.7 1.8   < > = values in this interval not displayed.   GFR Estimated Creatinine Clearance: 113.1 mL/min (A) (by C-G formula based on SCr of 0.56 mg/dL (L)). Liver Function Tests: Recent Labs  Lab 02/19/20 2058 02/20/20 0457 02/23/20 1106 02/24/20 0412  AST 33 29 31 37  ALT 24 22 21 20   ALKPHOS 299* 289* 294* 266*  BILITOT  1.0 0.9 1.4* 1.2  PROT 7.5 7.3 7.7 7.0  ALBUMIN 3.5 3.5 3.6 3.3*   No results for input(s): LIPASE, AMYLASE in the last 168 hours. No results for input(s): AMMONIA in the last 168 hours. Coagulation profile Recent Labs  Lab 02/19/20 2058  INR 1.1   COVID-19 Labs  No results for input(s): DDIMER, FERRITIN, LDH, CRP in the last 72 hours.  Lab Results  Component Value Date   SARSCOV2NAA NEGATIVE 02/23/2020   SARSCOV2NAA NEGATIVE 02/19/2020   [place] NEGATIVE 01/27/2020   [place] NEGATIVE 01/22/2020    CBC: Recent Labs  Lab 02/19/20 2058 02/20/20 0457 02/23/20 1106 02/24/20 0412  WBC 8.7 7.8 10.9* 7.9  NEUTROABS 4.1  --   --  4.2  HGB 12.6* 12.7* 12.9* 12.5*  HCT 40.5 39.7 40.3 39.2  MCV 63.8* 62.6* 62.5* 62.6*  PLT 287 280 243 215   Cardiac Enzymes: No results for input(s): CKTOTAL, CKMB, CKMBINDEX, TROPONINI in the last 168 hours. BNP (last 3 results) No results for input(s): PROBNP in the last 8760 hours. CBG: Recent Labs  Lab 02/19/20 2100  GLUCAP 102*   D-Dimer: No results for input(s): DDIMER in the last 72 hours. Hgb A1c: No results for input(s): HGBA1C in the last 72 hours. Lipid Profile: No results for input(s): CHOL, HDL, LDLCALC, TRIG, CHOLHDL, LDLDIRECT in the last 72 hours. Thyroid function studies: No results for input(s): TSH, T4TOTAL, T3FREE, THYROIDAB in the last 72 hours.  Invalid input(s): FREET3 Anemia work up: No results for input(s): VITAMINB12, FOLATE, FERRITIN, TIBC, IRON, RETICCTPCT in the last 72 hours. Sepsis Labs: Recent Labs  Lab 02/19/20 2058 02/20/20 0457 02/23/20 1106 02/24/20 0412  WBC 8.7 7.8 10.9* 7.9  LATICACIDVEN 1.4  --   --   --    Microbiology Recent Results (from the past 240 hour(s))  SARS  Coronavirus 2 by RT PCR (hospital order, performed in [facility] hospital lab) Nasopharyngeal Nasopharyngeal Swab     Status: None   Collection Time: 02/19/20 11:09 PM   Specimen: Nasopharyngeal Swab  Result  Value Ref Range Status   SARS Coronavirus 2 NEGATIVE NEGATIVE Final    Comment: (NOTE) SARS-CoV-2 target nucleic acids are NOT DETECTED.  The SARS-CoV-2 RNA is generally detectable in upper and lower respiratory specimens during the acute phase of infection. The lowest concentration of SARS-CoV-2 viral copies this assay can detect is 250 copies / mL. A negative result does not preclude SARS-CoV-2 infection and should not be used as the sole basis for treatment or other patient management decisions.  A negative result may occur with improper specimen collection / handling, submission of specimen other than nasopharyngeal swab, presence of viral mutation(s) within the areas targeted by this assay, and inadequate number of viral copies (<250 copies / mL). A negative result must be combined with clinical observations, patient history, and epidemiological information.  Fact Sheet for Patients:   BoilerBrush.com.cy  Fact Sheet for Healthcare Providers: https://pope.com/  This test is not yet approved or  cleared by the [place] FDA and has been authorized for detection and/or diagnosis of SARS-CoV-2 by FDA under an Emergency Use Authorization (EUA).  This EUA will remain in effect (meaning this test can be used) for the duration of the COVID-19 declaration under Section 564(b)(1) of the Act, 21 U.S.C. section 360bbb-3(b)(1), unless the authorization is terminated or revoked sooner.  Performed at [facility], [address] [place], [place] [address]   SARS Coronavirus 2 by RT PCR (hospital order, performed in [facility] hospital lab) Nasopharyngeal Nasopharyngeal Swab     Status: None   Collection Time: 02/23/20  7:10 PM   Specimen: Nasopharyngeal Swab  Result Value Ref Range Status   SARS Coronavirus 2 NEGATIVE NEGATIVE Final    Comment: (NOTE) SARS-CoV-2 target nucleic acids are NOT DETECTED.  The SARS-CoV-2 RNA is  generally detectable in upper and lower respiratory specimens during the acute phase of infection. The lowest concentration of SARS-CoV-2 viral copies this assay can detect is 250 copies / mL. A negative result does not preclude SARS-CoV-2 infection and should not be used as the sole basis for treatment or other patient management decisions.  A negative result may occur with improper specimen collection / handling, submission of specimen other than nasopharyngeal swab, presence of viral mutation(s) within the areas targeted by this assay, and inadequate number of viral copies (<250 copies / mL). A negative result must be combined with clinical observations, patient history, and epidemiological information.  Fact Sheet for Patients:   BoilerBrush.com.cy  Fact Sheet for Healthcare Providers: https://pope.com/  This test is not yet approved or  cleared by the [place] FDA and has been authorized for detection and/or diagnosis of SARS-CoV-2 by FDA under an Emergency Use Authorization (EUA).  This EUA will remain in effect (meaning this test can be used) for the duration of the COVID-19 declaration under Section 564(b)(1) of the Act, 21 U.S.C. section 360bbb-3(b)(1), unless the authorization is terminated or revoked sooner.  Performed at [facility], [address], [place], [place] [address]      Medications:   . atorvastatin  40 mg Oral Daily  . busPIRone  10 mg Oral TID  . carvedilol  3.125 mg Oral BID WC  . digoxin  0.125 mg Oral Daily  . enoxaparin (LOVENOX) injection  40 mg  Subcutaneous Daily  . fluticasone furoate-vilanterol  1 puff Inhalation Daily  . gabapentin  300 mg Oral BID  . LORazepam  1 mg Intravenous Once  . nicotine  21 mg Transdermal Daily  . phenytoin  100 mg Oral TID  . QUEtiapine  300 mg Oral Daily  . sacubitril-valsartan  1 tablet Oral BID   Continuous Infusions: . 0.9 % NaCl with KCl 20 mEq /  L 50 mL/hr at 02/24/20 0019      LOS: 1 day   Marinda Elk  [facility]  02/24/2020, 7:38 AM

## 2020-02-24 NOTE — ED Notes (Signed)
Pt alert, eating lunch 

## 2020-02-24 NOTE — ED Notes (Signed)
Lunch Tray Ordered @ 1048. °

## 2020-02-24 NOTE — ED Notes (Signed)
Lunch tray at bedside. ?

## 2020-02-24 NOTE — Plan of Care (Signed)
  Problem: Self-Concept: Goal: Level of anxiety will decrease Outcome: Progressing   

## 2020-02-24 NOTE — ED Notes (Signed)
Pt alert, pleasant demeanor, eating breakfast; denies SI at this time

## 2020-02-24 NOTE — ED Notes (Signed)
Gave Pt water and a couple of graham crackers

## 2020-02-25 DIAGNOSIS — R45851 Suicidal ideations: Secondary | ICD-10-CM | POA: Diagnosis not present

## 2020-02-25 DIAGNOSIS — K0401 Reversible pulpitis: Secondary | ICD-10-CM | POA: Diagnosis not present

## 2020-02-25 DIAGNOSIS — F200 Paranoid schizophrenia: Secondary | ICD-10-CM | POA: Diagnosis not present

## 2020-02-25 DIAGNOSIS — E222 Syndrome of inappropriate secretion of antidiuretic hormone: Secondary | ICD-10-CM | POA: Diagnosis not present

## 2020-02-25 LAB — BASIC METABOLIC PANEL
Anion gap: 13 (ref 5–15)
BUN: 11 mg/dL (ref 6–20)
CO2: 22 mmol/L (ref 22–32)
Calcium: 8.6 mg/dL — ABNORMAL LOW (ref 8.9–10.3)
Chloride: 95 mmol/L — ABNORMAL LOW (ref 98–111)
Creatinine, Ser: 0.5 mg/dL — ABNORMAL LOW (ref 0.61–1.24)
GFR calc Af Amer: >60 mL/min (ref >60)
GFR calc non Af Amer: >60 mL/min (ref >60)
Glucose, Bld: 103 mg/dL — ABNORMAL HIGH (ref 70–99)
Potassium: 3.7 mmol/L (ref 3.5–5.1)
Sodium: 130 mmol/L — ABNORMAL LOW (ref 135–145)

## 2020-02-25 LAB — PATHOLOGIST SMEAR REVIEW

## 2020-02-25 LAB — MAGNESIUM: Magnesium: 1.9 mg/dL (ref 1.7–2.4)

## 2020-02-25 MED ORDER — TORSEMIDE 20 MG PO TABS
20.0000 mg | ORAL_TABLET | Freq: Every day | ORAL | Status: DC
  Administered 2020-02-25 – 2020-03-10 (×15): 20 mg via ORAL
  Filled 2020-02-25 (×16): qty 1

## 2020-02-25 MED ORDER — QUETIAPINE FUMARATE 200 MG PO TABS
200.0000 mg | ORAL_TABLET | Freq: Every day | ORAL | Status: DC
  Administered 2020-02-26 – 2020-06-08 (×104): 200 mg via ORAL
  Filled 2020-02-25 (×54): qty 1
  Filled 2020-02-25: qty 2
  Filled 2020-02-25 (×50): qty 1

## 2020-02-25 NOTE — Progress Notes (Addendum)
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  He was also found to be severely hyponatremic on presentation.He had left AGAINST MEDICAL ADVICE on 6/19.  On presentation he was hypokalemic/hypomagnesemic.  He also had seizure episodes while in the waiting room.  Patient reported not taking Dilantin at home.Sodium level has improved. Current plan is to place him in a group home.  Medically stable for discharge.   Assessment & Plan:   Principal Problem:   Hyponatremia Active Problems:   Chronic systolic CHF (congestive heart failure) (HCC)   Mixed hyperlipidemia   COPD (chronic obstructive pulmonary disease) (HCC)   Seizure disorder (HCC)   Paranoid schizophrenia (HCC)   Hypokalemia   Hypomagnesemia   Suicidal ideation   Hyponatremia: Suspected to be from hypotonic hypovolemic hyponatremia.  He was recently discharged from here after being managed for hyponatremia.  He was on torsemide, metolazone at home, also drinking alcohol.  He complained of dizziness upon standing.  Significantly improved with IV fluids.  We will stop metolazone on discharge. Monitor BMP  Seizure disorder:Had seizure episodes in the hospital while in the waiting room.   Suspected to be from noncompliance .  He was supposed to be on Dilantin at home which she was not taking.  Dilantin has been restarted.  He was given a loading dose of fosphenytoin.  Currently he is alert and oriented  Suicidal ideation/schizophrenia: He has history of schizophrenia.  There was concern for suicidal ideation so psychiatry was consulted.  He denies any suicidal ideation  now.  Psychiatry does not recommend inpatient psychiatric admission. Currently on BuSpar, Neurontin, Invega, Xanax as needed and outpatient psychiatry follow-up.  Dose of Seroquel decreased to 200 mg  Prolonged QTC: Prolonged QTC on presentation.  Improved to 450 today.  Dose of Seroquel decreased  Chronic systolic congestive heart failure: Appeared dry on physical examination on presentation.  Diuretic therapy were held.  On Entresto/Coreg [place] are being continued.  Given IV fluids which will be held now.  His echocardiogram done on May 2021 showed ejection fraction around 530%, global hypokinesis, right ventricular volume overload.We will resume torsemide 20 mg daily   COPD: Not on home oxygen.  Currently stable.  On inhalers at home.  Hypokalemia: Potassium being supplemented.  Continue to monitor.  Hypomagnesemia: Most likely secondary to alcohol intake.  Continue to monitor.  Unsafe home environment: Social Development worker, community recommending group home.          DVT prophylaxis: Lovenox Code Status: Full code Family Communication: None Status is: Inpatient  Remains inpatient appropriate because:Persistent severe electrolyte disturbances   Dispo: The patient is from: Home              Anticipated d/c is to: group home              Anticipated d/c date is: When placement is fixed              Patient currently is medically stable for discharge    Consultants: None  Procedures:None  Antimicrobials: None Anti-infectives (From admission, onward)   None      Subjective: Patient seen and examined at the bedside this  morning.  Sleepy during my evaluation but readily woke up after calling his name.  Alert and oriented.  Denies any complaints.  Objective: Vitals:   02/24/20 1747 02/24/20 1929 02/25/20 0000 02/25/20 0300  BP: (!) 130/93 108/83 124/76 97/71  Pulse: 95 97 90 99  Resp: 20 20 20 20   Temp: 97.8 F (36.6 C) 98.3 F (36.8 C) 98 F (36.7 C) 97.6 F  (36.4 C)  TempSrc: Oral Oral Oral Oral  SpO2: 99% 99% 98% 100%    Intake/Output Summary (Last 24 hours) at 02/25/2020 0759 Last data filed at 02/25/2020 0127 Gross per 24 hour  Intake 2371.37 ml  Output 1650 ml  Net 721.37 ml   There were no vitals filed for this visit.  Examination:  General exam: Appears calm and comfortable ,Not in distress,average built HEENT:PERRL,Oral mucosa moist, Ear/Nose normal on gross exam Respiratory system: Bilateral equal air entry, normal vesicular breath sounds, no wheezes or crackles  Cardiovascular system: S1 & S2 heard, RRR. No JVD, murmurs, rubs, gallops or clicks. No pedal edema. Gastrointestinal system: Abdomen is nondistended, soft and nontender. No organomegaly or masses felt. Normal bowel sounds heard. Central nervous system: Alert and oriented. No focal neurological deficits. Extremities: No edema, no clubbing ,no cyanosis, distal peripheral pulses palpable. Skin: No rashes, lesions or ulcers,no icterus ,no pallor    Data Reviewed: I have personally reviewed following labs and imaging studies  CBC: Recent Labs  Lab 02/19/20 2058 02/20/20 0457 02/23/20 1106 02/24/20 0412  WBC 8.7 7.8 10.9* 7.9  NEUTROABS 4.1  --   --  4.2  HGB 12.6* 12.7* 12.9* 12.5*  HCT 40.5 39.7 40.3 39.2  MCV 63.8* 62.6* 62.5* 62.6*  PLT 287 280 243 215   Basic Metabolic Panel: Recent Labs  Lab 02/21/20 0334 02/23/20 1106 02/23/20 1544 02/23/20 1910 02/24/20 0030 02/24/20 0412 02/24/20 0814 02/24/20 1159 02/25/20 0357  NA 132*   < > 121* 121* 124* 124* 125* 124*  --   K 3.4*   < >  --  2.4* 2.8* 4.2 3.0* 4.9  --   CL 87*   < >  --  77* 83* 85* 86* 86*  --   CO2 33*   < >  --  30 29 28 26 26   --   GLUCOSE 101*   < >  --  100* 89 80 87 110*  --   BUN 20   < >  --  12 10 11 9 10   --   CREATININE 0.63   < >  --  0.59* 0.48* 0.56* 0.58* 0.49*  --   CALCIUM 9.4   < >  --  9.3 8.8* 8.9 9.1 8.7*  --   MG 2.1  --  1.4*  --  1.7 1.8  --   --  1.9   < >  = values in this interval not displayed.   GFR: Estimated Creatinine Clearance: 113.1 mL/min (A) (by C-G formula based on SCr of 0.49 mg/dL (L)). Liver Function Tests: Recent Labs  Lab 02/19/20 2058 02/20/20 0457 02/23/20 1106 02/24/20 0412  AST 33 29 31 37  ALT 24 22 21 20   ALKPHOS 299* 289* 294* 266*  BILITOT 1.0 0.9 1.4* 1.2  PROT 7.5 7.3 7.7 7.0  ALBUMIN 3.5 3.5 3.6 3.3*   No results for input(s): LIPASE, AMYLASE in the last 168 hours. No results for input(s): AMMONIA in the last 168 hours. Coagulation Profile: Recent Labs  Lab 02/19/20 2058  INR 1.1  Cardiac Enzymes: No results for input(s): CKTOTAL, CKMB, CKMBINDEX, TROPONINI in the last 168 hours. BNP (last 3 results) No results for input(s): PROBNP in the last 8760 hours. HbA1C: No results for input(s): HGBA1C in the last 72 hours. CBG: Recent Labs  Lab 02/19/20 2100  GLUCAP 102*   Lipid Profile: No results for input(s): CHOL, HDL, LDLCALC, TRIG, CHOLHDL, LDLDIRECT in the last 72 hours. Thyroid Function Tests: No results for input(s): TSH, T4TOTAL, FREET4, T3FREE, THYROIDAB in the last 72 hours. Anemia Panel: No results for input(s): VITAMINB12, FOLATE, FERRITIN, TIBC, IRON, RETICCTPCT in the last 72 hours. Sepsis Labs: Recent Labs  Lab 02/19/20 2058  LATICACIDVEN 1.4    Recent Results (from the past 240 hour(s))  SARS Coronavirus 2 by RT PCR (hospital order, performed in [facility] hospital lab) Nasopharyngeal Nasopharyngeal Swab     Status: None   Collection Time: 02/19/20 11:09 PM   Specimen: Nasopharyngeal Swab  Result Value Ref Range Status   SARS Coronavirus 2 NEGATIVE NEGATIVE Final    Comment: (NOTE) SARS-CoV-2 target nucleic acids are NOT DETECTED.  The SARS-CoV-2 RNA is generally detectable in upper and lower respiratory specimens during the acute phase of infection. The lowest concentration of SARS-CoV-2 viral copies this assay can detect is 250 copies / mL. A negative result does  not preclude SARS-CoV-2 infection and should not be used as the sole basis for treatment or other patient management decisions.  A negative result may occur with improper specimen collection / handling, submission of specimen other than nasopharyngeal swab, presence of viral mutation(s) within the areas targeted by this assay, and inadequate number of viral copies (<250 copies / mL). A negative result must be combined with clinical observations, patient history, and epidemiological information.  Fact Sheet for Patients:   BoilerBrush.com.cy  Fact Sheet for Healthcare Providers: https://pope.com/  This test is not yet approved or  cleared by the [place] FDA and has been authorized for detection and/or diagnosis of SARS-CoV-2 by FDA under an Emergency Use Authorization (EUA).  This EUA will remain in effect (meaning this test can be used) for the duration of the COVID-19 declaration under Section 564(b)(1) of the Act, 21 U.S.C. section 360bbb-3(b)(1), unless the authorization is terminated or revoked sooner.  Performed at [facility], [address], [place], [place] [address]   SARS Coronavirus 2 by RT PCR (hospital order, performed in [facility] hospital lab) Nasopharyngeal Nasopharyngeal Swab     Status: None   Collection Time: 02/23/20  7:10 PM   Specimen: Nasopharyngeal Swab  Result Value Ref Range Status   SARS Coronavirus 2 NEGATIVE NEGATIVE Final    Comment: (NOTE) SARS-CoV-2 target nucleic acids are NOT DETECTED.  The SARS-CoV-2 RNA is generally detectable in upper and lower respiratory specimens during the acute phase of infection. The lowest concentration of SARS-CoV-2 viral copies this assay can detect is 250 copies / mL. A negative result does not preclude SARS-CoV-2 infection and should not be used as the sole basis for treatment or other patient management decisions.  A negative result may occur  with improper specimen collection / handling, submission of specimen other than nasopharyngeal swab, presence of viral mutation(s) within the areas targeted by this assay, and inadequate number of viral copies (<250 copies / mL). A negative result must be combined with clinical observations, patient history, and epidemiological information.  Fact Sheet for Patients:   BoilerBrush.com.cy  Fact Sheet for Healthcare Providers: https://pope.com/  This test is not yet approved  or  cleared by the [name] and has been authorized for detection and/or diagnosis of SARS-CoV-2 by FDA under an Emergency Use Authorization (EUA).  This EUA will remain in effect (meaning this test can be used) for the duration of the COVID-19 declaration under Section 564(b)(1) of the Act, 21 U.S.C. section 360bbb-3(b)(1), unless the authorization is terminated or revoked sooner.  Performed at [facility], [address] [place], [place] [address]          Radiology Studies: CT Head Wo Contrast  Result Date: 02/23/2020 CLINICAL DATA:  Generalized weakness. EXAM: CT HEAD WITHOUT CONTRAST TECHNIQUE: Contiguous axial images were obtained from the base of the skull through the vertex without intravenous contrast. COMPARISON:  February 19, 2020 FINDINGS: Brain: No evidence of acute infarction, hemorrhage, hydrocephalus, extra-axial collection or mass lesion/mass effect. A stable, approximately 5.5 cm x 4.6 cm area of CSF attenuation is seen within the right occipital lobe. There is no evidence of associated mass effect or midline shift. Vascular: No hyperdense vessel or unexpected calcification. Skull: Normal. Negative for fracture or focal lesion. Sinuses/Orbits: No acute finding. Other: None. IMPRESSION: 1. No acute intracranial abnormality. 2. Stable right occipital lobe arachnoid cyst. Electronically Signed   By: Aram Candela M.D.   On: 02/23/2020  18:34   CT Cervical Spine Wo Contrast  Result Date: 02/23/2020 CLINICAL DATA:  Generalized weakness. EXAM: CT CERVICAL SPINE WITHOUT CONTRAST TECHNIQUE: Multidetector CT imaging of the cervical spine was performed without intravenous contrast. Multiplanar CT image reconstructions were also generated. COMPARISON:  None. FINDINGS: Alignment: Normal. Skull base and vertebrae: No acute fracture. No primary bone lesion or focal pathologic process. Soft tissues and spinal canal: No prevertebral fluid or swelling. No visible canal hematoma. Disc levels: Moderate severity multilevel endplate sclerosis is seen. Moderate severity intervertebral disc space narrowing is seen at the level of C3-C4. Mild multilevel intervertebral disc space narrowing is seen throughout the remainder of the cervical spine. Normal bilateral multilevel facet joints are seen. Upper chest: Negative. Other: None. IMPRESSION: Multilevel degenerative disc disease of the cervical spine, most prominent at the level of C3-C4. Electronically Signed   By: Aram Candela M.D.   On: 02/23/2020 18:38   DG Chest Portable 1 View  Result Date: 02/23/2020 CLINICAL DATA:  Seizure today. No chest symptoms. Pt states wanting to hurt his selfseizure EXAM: PORTABLE CHEST 1 VIEW COMPARISON:  None. FINDINGS: Stable mild enlarged cardiac silhouette. Low lung volumes. No focal infiltrate. No pneumothorax. No pulmonary edema. No acute osseous abnormality. IMPRESSION: 1. Low lung volumes. 2. No acute findings. Electronically Signed   By: Genevive Bi M.D.   On: 02/23/2020 18:16        Scheduled Meds: . arformoterol  15 mcg Nebulization BID  . atorvastatin  40 mg Oral Daily  . busPIRone  10 mg Oral TID  . carvedilol  3.125 mg Oral BID WC  . digoxin  0.125 mg Oral Daily  . enoxaparin (LOVENOX) injection  40 mg Subcutaneous Daily  . fluticasone furoate-vilanterol  1 puff Inhalation Daily  . gabapentin  300 mg Oral BID  . LORazepam  1 mg Intravenous  Once  . nicotine  21 mg Transdermal Daily  . phenytoin  100 mg Oral TID  . QUEtiapine  300 mg Oral Daily  . sacubitril-valsartan  1 tablet Oral BID   Continuous Infusions: . 0.9 % NaCl with KCl 20 mEq / L 50 mL/hr at 02/24/20 0019     LOS: 2  days    Time spent: 35 mins.More than 50% of that time was spent in counseling and/or coordination of care.      Burnadette Pop, MD [facility] P6/23/2021, 7:59 AM
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
## 2020-02-25 NOTE — Consult Note (Signed)
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  [employer] of Knowledge:  Fair  Language:  Fair  Akathisia:  No  Handed:  Right  AIMS (if indicated):     Assets:  Communication Skills Desire for Improvement Financial Resources/Insurance Housing Intimacy Leisure Time Physical Health Resilience Social Support  ADL's:  Intact  Cognition:  WNL  Sleep:        Treatment Plan Summary: Patient discussed with  Dr. Lucianne Muss.   Patient is a 54 year old male admitted with suicidal ideations.  Patient currently denies all crisis criteria including suicidal ideations.  Patient is pleasant and cooperative, denies depressive symptoms, denies hallucinations.  Based on my evaluation today, patient does not require inpatient psychiatric hospitalization.  Recommendations: -Recommend decrease Seroquel to 200 mg nightly.  Repeat EKG, current QTC =530  Continue current home medications including: -Xanax 0.5 mg p.o. twice daily as needed -BuSpar 10 mg p.o. 3 times daily -Neurontin 300 mg p.o. twice daily -Invega 234 milligrams IM every 30 days last administration 02/10/2020. -Recommend consider social work consult to address current housing needs. -Recommend follow-up with outpatient psychiatry.  Disposition: No evidence of imminent risk to self or others at present.   Patient does not meet criteria for psychiatric inpatient admission. Supportive therapy provided about ongoing stressors.  Patrcia Dolly, FNP 02/25/2020 9:42 AM

## 2020-02-25 NOTE — Evaluation (Signed)
Physical Therapy Evaluation Patient Details Name: Jim Dunn MRN: [id number] DOB: April 17, 1966 Today's Date: 02/25/2020   History of Present Illness  Pt is a 54 y/o male admitted secondary to hyponatremia and suicidal ideation. PMH includes CHF, COPD, seizures, and paranoid schizophrenia.   Clinical Impression  Pt admitted secondary to problem above with deficits below. Pt requiring min to min guard A for ambulation with cane. Reporting increased weakness in BLE and noted slight buckling in BLE throughout. Educated about using RW at d/c to increase independence with mobility. Will continue to follow acutely to maximize functional mobility independence and safety.     Follow Up Recommendations Home health PT    Equipment Recommendations  Rolling walker with 5" wheels    Recommendations for Other Services       Precautions / Restrictions Precautions Precautions: Fall Precaution Comments: suicide sitter Restrictions Weight Bearing Restrictions: No      Mobility  Bed Mobility               General bed mobility comments: Sitting in chair upon entry.   Transfers Overall transfer level: Needs assistance Equipment used: Straight cane Transfers: Sit to/from Stand Sit to Stand: Min guard         General transfer comment: Mild unsteadiness noted initially upon standing. Min guard for safety.   Ambulation/Gait Ambulation/Gait assistance: Min guard;Min assist Gait Distance (Feet): 150 Feet Assistive device: Straight cane Gait Pattern/deviations: Step-through pattern;Decreased stride length;Shuffle Gait velocity: Decreased   General Gait Details: Pt with BLE functional weakness noted as pt with occasional knee buckling. Required cues for appropriate use of cane as he would let it drag behind. Educated about using RW for increased safety. Min to min guard A for steadying.   Stairs            Wheelchair Mobility    Modified Rankin (Stroke Patients Only)        Balance Overall balance assessment: Needs assistance Sitting-balance support: No upper extremity supported;Feet supported Sitting balance-Leahy Scale: Good     Standing balance support: No upper extremity supported;Single extremity supported;During functional activity Standing balance-Leahy Scale: Fair Standing balance comment: Able to maintain static standing without UE support                              Pertinent Vitals/Pain Pain Assessment: No/denies pain    Home Living Family/patient expects to be discharged to:: Private residence Living Arrangements: Parent Available Help at Discharge: Family Type of Home: House Home Access: Level entry     Home Layout: Two level;Bed/bath upstairs;1/2 bath on main level;Other (Comment) Home Equipment: Cane - single point      Prior Function Level of Independence: Independent with assistive device(s)         Comments: Had been using cane for ambulation      Hand Dominance        Extremity/Trunk Assessment   Upper Extremity Assessment Upper Extremity Assessment: Overall WFL for tasks assessed    Lower Extremity Assessment Lower Extremity Assessment: Generalized weakness    Cervical / Trunk Assessment Cervical / Trunk Assessment: Normal  Communication   Communication: No difficulties  Cognition Arousal/Alertness: Awake/alert Behavior During Therapy: Flat affect Overall Cognitive Status: History of cognitive impairments - at baseline                                 General Comments:  paranoid schizophrenia at baseline       General Comments      Exercises     Assessment/Plan    PT Assessment Patient needs continued PT services  PT Problem List Decreased strength;Decreased balance;Decreased mobility;Decreased cognition;Decreased knowledge of use of DME       PT Treatment Interventions Gait training;DME instruction;Functional mobility training;Stair training;Therapeutic  exercise;Therapeutic activities;Balance training;Patient/family education;Cognitive remediation    PT Goals (Current goals can be found in the Care Plan section)  Acute Rehab PT Goals Patient Stated Goal: none stated PT Goal Formulation: With patient Time For Goal Achievement: 03/10/20 Potential to Achieve Goals: Good    Frequency Min 3X/week   Barriers to discharge        Co-evaluation               AM-PAC PT "6 Clicks" Mobility  Outcome Measure Help needed turning from your back to your side while in a flat bed without using bedrails?: None Help needed moving from lying on your back to sitting on the side of a flat bed without using bedrails?: None Help needed moving to and from a bed to a chair (including a wheelchair)?: A Little Help needed standing up from a chair using your arms (e.g., wheelchair or bedside chair)?: A Little Help needed to walk in hospital room?: A Little Help needed climbing 3-5 steps with a railing? : A Lot 6 Click Score: 19    End of Session Equipment Utilized During Treatment: Gait belt Activity Tolerance: Patient tolerated treatment well Patient left: in bed;with call bell/phone within reach;with nursing/sitter in room (sitting EOB with safety sitter present ) Nurse Communication: Mobility status PT Visit Diagnosis: Unsteadiness on feet (R26.81);Muscle weakness (generalized) (M62.81)    Time: 1610-9604 PT Time Calculation (min) (ACUTE ONLY): 17 min   Charges:   PT Evaluation $PT Eval Moderate Complexity: 1 Mod          Reuel Derby, PT, DPT  Acute Rehabilitation Services  Pager: [phone] Office: [phone]   Rudean Hitt 02/25/2020, 1:39 PM
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
## 2020-02-25 NOTE — Plan of Care (Signed)
  Problem: Education: Goal: Expressions of having a comfortable level of knowledge regarding the disease process will increase Outcome: Progressing   

## 2020-02-26 LAB — BASIC METABOLIC PANEL
Anion gap: 6 (ref 5–15)
BUN: 15 mg/dL (ref 6–20)
CO2: 26 mmol/L (ref 22–32)
Calcium: 8.5 mg/dL — ABNORMAL LOW (ref 8.9–10.3)
Chloride: 97 mmol/L — ABNORMAL LOW (ref 98–111)
Creatinine, Ser: 0.64 mg/dL (ref 0.61–1.24)
GFR calc Af Amer: >60 mL/min (ref >60)
GFR calc non Af Amer: >60 mL/min (ref >60)
Glucose, Bld: 98 mg/dL (ref 70–99)
Potassium: 3.4 mmol/L — ABNORMAL LOW (ref 3.5–5.1)
Sodium: 129 mmol/L — ABNORMAL LOW (ref 135–145)

## 2020-02-26 MED ORDER — POTASSIUM CHLORIDE CRYS ER 20 MEQ PO TBCR
40.0000 meq | EXTENDED_RELEASE_TABLET | Freq: Once | ORAL | Status: AC
  Administered 2020-02-26: 40 meq via ORAL
  Filled 2020-02-26: qty 2

## 2020-02-26 MED ORDER — KETOROLAC TROMETHAMINE 30 MG/ML IJ SOLN
30.0000 mg | Freq: Once | INTRAMUSCULAR | Status: AC
  Administered 2020-02-26: 30 mg via INTRAVENOUS
  Filled 2020-02-26: qty 1

## 2020-02-26 MED ORDER — SODIUM CHLORIDE 1 G PO TABS
2.0000 g | ORAL_TABLET | Freq: Two times a day (BID) | ORAL | Status: DC
  Administered 2020-02-26: 2 g via ORAL
  Filled 2020-02-26: qty 2

## 2020-02-26 MED ORDER — POTASSIUM CHLORIDE IN NACL 20-0.9 MEQ/L-% IV SOLN: INTRAVENOUS | Status: DC

## 2020-02-26 MED ORDER — TRAMADOL HCL 50 MG PO TABS
50.0000 mg | ORAL_TABLET | Freq: Four times a day (QID) | ORAL | Status: DC | PRN
  Administered 2020-02-26 – 2020-03-07 (×6): 50 mg via ORAL
  Filled 2020-02-26 (×6): qty 1

## 2020-02-26 NOTE — Plan of Care (Signed)
  Problem: Self-Concept: Goal: Level of anxiety will decrease Outcome: Progressing   Problem: Education: Goal: Knowledge of General Education information will improve Description: Including pain rating scale, medication(s)/side effects and non-pharmacologic comfort measures Outcome: Progressing   

## 2020-02-26 NOTE — Progress Notes (Signed)
SI precautions decided per psych recommendations on 6/23.

## 2020-02-26 NOTE — Progress Notes (Addendum)
Outpatient CSW was consulted by Heart Failure team for potential Community paramedicine referral to help pt manage health concerns at home during last admission but pt was readmitted to hospital and CSW has been following for potential needs.    Pt now with emergent housing needs so CSW reached out to inpatient [facility] social worker to offer assistance moving forward with placement as patient family is not willing to have him return to their home due to pt mental health concerns.  They are hopeful for group home or ALF placement- CSW assisting in looking into potential options for patient.  CSW called [facility] and spoke with representative regarding pt case- she directed CSW towards group home listing.  CSW has reached out to the following facilities regarding availability:  -[facility]- [phone]- left VM with manager of facility - [facility]- requires an innovations waiver which is supplied through [place] which pt does not have at this time -[facility]- full -[facility]- left VM -[phone]- only a two bedroom facility with no availability -[facility]- [place] facilities in [place]  Also called [facility] which supplies supportive independent living for those with mental illness- left message for manager regarding application to their program  Outpatient CSW continuing to reach out to group homes to assess availability and will keep inpatient [facility] social worker informing of findings to assist with placement  Burna Sis, LCSW Clinical Social Worker [facility] Desk#: [phone] Cell#: [phone]
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
## 2020-02-26 NOTE — Progress Notes (Signed)
Occupational Therapy Evaluation  PTA, pt was living at home with his family and was modified independent with mobility and ADL @ cane level. Mom assisted with medication and financial management. Pt with mild unsteadiness during assessment. Copmleting ADL and functional mobility with overall S @ cane level. Pt has a history of peripheral neuropathy and states he "walks this way" becuase his "feet hurt". Pt able to give history and recount why he was admitted to the hospital. He states his "sodium ws low" and he was "talking out of his head". Pt would be appropriate for a group home setting with assistance with medication and financial management. Will follow up with pt acutely to facilitate safe DC.     02/26/20 1700  OT Visit Information  Last OT Received On 02/26/20  Assistance Needed +1  History of Present Illness Pt is a 54 y/o male admitted secondary to hyponatremia and suicidal ideation. PMH includes CHF, COPD, seizures, and paranoid schizophrenia.   Precautions  Precautions Fall  Precaution Comments suicide sitter  Home Living  Family/patient expects to be discharged to: Private residence  Living Arrangements Parent  Available Help at Discharge Family  Type of Home House  Home Access Level entry  Home Layout Two level;Bed/bath upstairs;1/2 bath on main level;Other (Comment)  Alternate Level Stairs-Number of Steps 13  Alternate Level Stairs-Rails Left  Bathroom Shower/Tub Tub/shower unit  Dealer - single point  Prior Function  Level of Independence Independent with assistive device(s)  Comments Mom assists with medicaiton and financial management  Communication  Communication No difficulties  Pain Assessment  Pain Assessment No/denies pain  Cognition  Arousal/Alertness Awake/alert  Behavior During Therapy WFL for tasks assessed/performed (hx of schitzophrenia)  Overall Cognitive Status History of cognitive impairments - at baseline   General Comments paranoid schizophrenia at baseline; ablet o give history of trauma and death in family; discussed how he use to work then lost him job when he "got sick" and was never able to "get back to normal"  Upper Extremity Assessment  Upper Extremity Assessment Overall WFL for tasks assessed  Lower Extremity Assessment  Lower Extremity Assessment Defer to PT evaluation (Hx of peripheral neuropathy)  Cervical / Trunk Assessment  Cervical / Trunk Assessment Normal  ADL  Overall ADL's  Needs assistance/impaired  Functional mobility during ADLs Supervision/safety;Cane  General ADL Comments Pt overall S with bathing adn dressing due to mild unsteadiness. Able to negotiate small spaces with use of cane. Did not demonstrate any LOB during session.; states he takes "21 medicines"  Bed Mobility  Overal bed mobility Independent  Transfers  Overall transfer level Needs assistance  Equipment used Straight cane  Transfers Sit to/from Stand  Sit to Stand Supervision  Balance  Sitting balance-Leahy Scale Good  Standing balance-Leahy Scale Fair  General Comments  General comments (skin integrity, edema, etc.) "I walk slow because my feet hurt"; switches hands at times when using cane - pt states this is normal for him  OT - End of Session  Equipment Utilized During Treatment Gait belt (cane)  Activity Tolerance Patient tolerated treatment well  Patient left in chair;with call bell/phone within reach;with nursing/sitter in room  Nurse Communication Mobility status  OT Assessment  OT Recommendation/Assessment Patient needs continued OT Services  OT Visit Diagnosis Unsteadiness on feet (R26.81)  OT Problem List Impaired balance (sitting and/or standing);Decreased safety awareness  Barriers to Discharge Decreased caregiver support  OT Plan  OT Frequency (ACUTE ONLY) Min 2X/week  OT Treatment/Interventions (ACUTE ONLY) Self-care/ADL training;Therapeutic exercise;DME and/or AE  instruction;Therapeutic activities;Patient/family education  AM-PAC OT "6 Clicks" Daily Activity Outcome Measure (Version 2)  Help from another person eating meals? 4  Help from another person taking care of personal grooming? 3  Help from another person toileting, which includes using toliet, bedpan, or urinal? 3  Help from another person bathing (including washing, rinsing, drying)? 3  Help from another person to put on and taking off regular upper body clothing? 3  Help from another person to put on and taking off regular lower body clothing? 3  6 Click Score 19  OT Recommendation  Follow Up Recommendations Other (comment);Supervision - Intermittent (S wtih all medication and financial management)  Individuals Consulted  Consulted and Agree with Results and Recommendations Patient  Acute Rehab OT Goals  Patient Stated Goal to have somewhere to go   OT Goal Formulation With patient  Time For Goal Achievement 03/11/20  Potential to Achieve Goals Good  OT Time Calculation  OT Start Time (ACUTE ONLY) 1553  OT Stop Time (ACUTE ONLY) 1610  OT Time Calculation (min) 17 min  OT General Charges  $OT Visit 1 Visit  OT Evaluation  $OT Eval Moderate Complexity 1 Mod  Written Expression  Dominant Hand Left  Maurie Boettcher, OT/L   Acute OT Clinical Specialist Acute Rehabilitation Services Pager [phone] Office [phone]

## 2020-02-26 NOTE — Progress Notes (Signed)
Physical Therapy Treatment Patient Details Name: Jim Dunn MRN: [id number] DOB: 11-11-1965 Today's Date: 02/26/2020    History of Present Illness Pt is a 54 y/o male admitted secondary to hyponatremia and suicidal ideation. PMH includes CHF, COPD, seizures, and paranoid schizophrenia.     PT Comments    Pt progressing towards physical therapy goals. Was able to perform transfers and ambulation with gross min guard assist to min assist without an AD. Pt refusing RW or SPC during session. Overall he is mildly unsteady however this is likely near baseline. He scored a 17/24 on the DGI, indicating he is at a high risk for falls. Pt reports he is going to an assisted living at d/c. Will continue to follow and progress as able per POC.    Follow Up Recommendations  Home health PT     Equipment Recommendations  Rolling walker with 5" wheels    Recommendations for Other Services       Precautions / Restrictions Precautions Precautions: Fall Precaution Comments: suicide sitter Restrictions Weight Bearing Restrictions: No    Mobility  Bed Mobility               General bed mobility comments: Sitter present upon entry. Pt was received in the recliner laid all the way back and the patient was essentially supine. No assist required to transition to full sitting position.   Transfers Overall transfer level: Needs assistance Equipment used: None Transfers: Sit to/from Stand Sit to Stand: Supervision         General transfer comment: Pt guarded but able to perform sit<>stand without assistance. Mild unsteadiness noted but pt did not require assist to recover.   Ambulation/Gait Ambulation/Gait assistance: Min guard;Min assist Gait Distance (Feet): 350 Feet Assistive device: None Gait Pattern/deviations: Step-through pattern;Decreased stride length;Shuffle Gait velocity: Decreased Gait velocity interpretation: <1.31 ft/sec, indicative of household ambulator General Gait  Details: Knees appeared slightly bent throughout gait training however no buckling noted. Pt refusing the RW and cane. Overall unsteady and pt required min guard to min assist throughout.    Stairs Stairs: Yes Stairs assistance: Min guard Stair Management: Two rails;Alternating pattern;Forwards Number of Stairs: 5 General stair comments: No assist required however close guard provided for safety.    Wheelchair Mobility    Modified Rankin (Stroke Patients Only)       Balance Overall balance assessment: Needs assistance Sitting-balance support: No upper extremity supported;Feet supported Sitting balance-Leahy Scale: Good     Standing balance support: No upper extremity supported;During functional activity Standing balance-Leahy Scale: Fair Standing balance comment: Able to maintain static standing without UE support                  Standardized Balance Assessment Standardized Balance Assessment : Dynamic Gait Index   Dynamic Gait Index Level Surface: Mild Impairment Change in Gait Speed: Mild Impairment Gait with Horizontal Head Turns: Mild Impairment Gait with Vertical Head Turns: Mild Impairment Gait and Pivot Turn: Normal Step Over Obstacle: Mild Impairment Step Around Obstacles: Mild Impairment Steps: Mild Impairment Total Score: 17      Cognition Arousal/Alertness: Awake/alert Behavior During Therapy: Flat affect Overall Cognitive Status: History of cognitive impairments - at baseline                                 General Comments: paranoid schizophrenia at baseline       Exercises      General Comments  Pertinent Vitals/Pain Pain Assessment: No/denies pain    Home Living                      Prior Function            PT Goals (current goals can now be found in the care plan section) Acute Rehab PT Goals Patient Stated Goal: none stated PT Goal Formulation: With patient Time For Goal Achievement:  03/10/20 Potential to Achieve Goals: Good Progress towards PT goals: Progressing toward goals    Frequency    Min 3X/week      PT Plan Current plan remains appropriate    Co-evaluation              AM-PAC PT "6 Clicks" Mobility   Outcome Measure  Help needed turning from your back to your side while in a flat bed without using bedrails?: None Help needed moving from lying on your back to sitting on the side of a flat bed without using bedrails?: None Help needed moving to and from a bed to a chair (including a wheelchair)?: A Little Help needed standing up from a chair using your arms (e.g., wheelchair or bedside chair)?: A Little Help needed to walk in hospital room?: A Little Help needed climbing 3-5 steps with a railing? : A Little 6 Click Score: 20    End of Session Equipment Utilized During Treatment: Gait belt Activity Tolerance: Patient tolerated treatment well Patient left: in bed;with call bell/phone within reach;with nursing/sitter in room (sitting EOB with safety sitter present ) Nurse Communication: Mobility status PT Visit Diagnosis: Unsteadiness on feet (R26.81);Muscle weakness (generalized) (M62.81)     Time: 6440-3474 PT Time Calculation (min) (ACUTE ONLY): 15 min  Charges:  $Gait Training: 8-22 mins                     Jim Dunn, PT, DPT Acute Rehabilitation Services Pager: [phone] Office: [phone]    Jim Dunn 02/26/2020, 2:30 PM

## 2020-02-26 NOTE — Progress Notes (Signed)
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  He was also found to be severely hyponatremic on presentation.He had left AGAINST MEDICAL ADVICE on 6/19.  On presentation he was hypokalemic/hypomagnesemic.  He also had seizure episodes while in the waiting room.  Patient reported not taking Dilantin at home.Sodium level has improved now and currently he is alert and oriented, seizure-free Pllan is to place him in a group home/ALF.  Medically stable for discharge.  Social worker assisting on placement.   Assessment & Plan:   Principal Problem:   Hyponatremia Active Problems:   Chronic systolic CHF (congestive heart failure) (HCC)   Mixed hyperlipidemia   COPD (chronic obstructive pulmonary disease) (HCC)   Seizure disorder (HCC)   Paranoid schizophrenia (HCC)   Hypokalemia   Hypomagnesemia   Suicidal ideation   Hyponatremia: Suspected to be from hypotonic hypovolemic hyponatremia on admission.  He was recently discharged from here after being managed for hyponatremia.  He was on torsemide, metolazone at home, also drinking alcohol.  He complained of dizziness upon standing.  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  He is on phenytoin Seroquel.  His  baseline sodium level is around 125-126.  Continue torsemide.  Seizure disorder:Had seizure episodes in the hospital while in the waiting room.   Suspected to be from noncompliance .  He was supposed to be on Dilantin at home which he was not taking.  Dilantin has been restarted.  He was given a loading dose of fosphenytoin.  Currently he is alert and  oriented  Suicidal ideation/schizophrenia: He has history of schizophrenia.  There was concern for suicidal ideation so psychiatry was consulted.  He denies any suicidal ideation now.  Psychiatry does not recommend inpatient psychiatric admission. Currently on BuSpar, Neurontin, Invega, Xanax as needed and outpatient psychiatry follow-up.  Dose of Seroquel decreased to 200 mg  Prolonged QTC: Prolonged QTC on presentation.  Improved to 450 .  Dose of Seroquel decreased to 200 mg HS  Chronic systolic congestive heart failure: Appeared dry on physical examination on presentation.  Diuretic therapy were held and given IV fluids.  On Entresto/Coreg Minus [place] are being continued.    His echocardiogram done on May 2021 showed ejection fraction around 25-30%, global hypokinesis, right ventricular volume overload.We resumed  torsemide 20 mg daily.  Metolazone can be held  COPD: Not on home oxygen.  Currently stable.  On inhalers at home.  Hypokalemia: Potassium  supplemented.  Continue to monitor.  Hypomagnesemia: Most likely secondary to alcohol intake.  Continue to monitor.  Unsafe home environment: Social Development worker, community recommending group home.          DVT prophylaxis: Lovenox Code Status: Full code Family Communication: None Status is: Inpatient  Remains inpatient appropriate because:Unsafe DC plan   Dispo: The patient is from: Home              Anticipated d/c is to: group home/ALF              Anticipated d/c date is: When placement is fixed              Patient currently is medically stable  for discharge    Consultants: None  Procedures:None  Antimicrobials: None Anti-infectives (From admission, onward)   None      Subjective: Patient seen and examined the bedside this morning.  Hemodynamically stable.  Comfortable.  No active issues.  Objective: Vitals:   02/25/20 1525 02/25/20 1903 02/25/20 2106 02/25/20 2300  BP: 116/89 96/72  128/74  Pulse: (!) 102  89  88  Resp: 18 19  18   Temp: 98.2 F (36.8 C) 98.4 F (36.9 C)  98.2 F (36.8 C)  TempSrc: Oral Oral  Oral  SpO2: 98% 100% 99% 99%    Intake/Output Summary (Last 24 hours) at 02/26/2020 0749 Last data filed at 02/25/2020 2100 Gross per 24 hour  Intake 1921.36 ml  Output 2725 ml  Net -803.64 ml   There were no vitals filed for this visit.  Examination:  General exam: Appears calm and comfortable ,Not in distress,average built HEENT:PERRL,Oral mucosa moist, Ear/Nose normal on gross exam Respiratory system: Bilateral equal air entry, normal vesicular breath sounds, no wheezes or crackles  Cardiovascular system: S1 & S2 heard, RRR. No JVD, murmurs, rubs, gallops or clicks. Gastrointestinal system: Abdomen is nondistended, soft and nontender. No organomegaly or masses felt. Normal bowel sounds heard. Central nervous system: Alert and oriented. No focal neurological deficits. Extremities: No edema, no clubbing ,no cyanosis. Skin: No rashes, lesions or ulcers,no icterus ,no pallor    Data Reviewed: I have personally reviewed following labs and imaging studies  CBC: Recent Labs  Lab 02/19/20 2058 02/20/20 0457 02/23/20 1106 02/24/20 0412  WBC 8.7 7.8 10.9* 7.9  NEUTROABS 4.1  --   --  4.2  HGB 12.6* 12.7* 12.9* 12.5*  HCT 40.5 39.7 40.3 39.2  MCV 63.8* 62.6* 62.5* 62.6*  PLT 287 280 243 215   Basic Metabolic Panel: Recent Labs  Lab 02/21/20 0334 02/23/20 1106 02/23/20 1544 02/23/20 1910 02/24/20 0030 02/24/20 0030 02/24/20 0412 02/24/20 0814 02/24/20 1159 02/25/20 0357 02/25/20 0811 02/26/20 0234  NA 132*   < > 121*   < > 124*   < > 124* 125* 124*  --  130* 129*  K 3.4*   < >  --    < > 2.8*   < > 4.2 3.0* 4.9  --  3.7 3.4*  CL 87*   < >  --    < > 83*   < > 85* 86* 86*  --  95* 97*  CO2 33*   < >  --    < > 29   < > 28 26 26   --  22 26  GLUCOSE 101*   < >  --    < > 89   < > 80 87 110*  --  103* 98  BUN 20   < >  --    < > 10   < > 11 9 10   --  11 15   CREATININE 0.63   < >  --    < > 0.48*   < > 0.56* 0.58* 0.49*  --  0.50* 0.64  CALCIUM 9.4   < >  --    < > 8.8*   < > 8.9 9.1 8.7*  --  8.6* 8.5*  MG 2.1  --  1.4*  --  1.7  --  1.8  --   --  1.9  --   --    < > = values in this interval not displayed.   GFR: Estimated Creatinine Clearance: 113.1  mL/min (by C-G formula based on SCr of 0.64 mg/dL). Liver Function Tests: Recent Labs  Lab 02/19/20 2058 02/20/20 0457 02/23/20 1106 02/24/20 0412  AST 33 29 31 37  ALT 24 22 21 20   ALKPHOS 299* 289* 294* 266*  BILITOT 1.0 0.9 1.4* 1.2  PROT 7.5 7.3 7.7 7.0  ALBUMIN 3.5 3.5 3.6 3.3*   No results for input(s): LIPASE, AMYLASE in the last 168 hours. No results for input(s): AMMONIA in the last 168 hours. Coagulation Profile: Recent Labs  Lab 02/19/20 2058  INR 1.1   Cardiac Enzymes: No results for input(s): CKTOTAL, CKMB, CKMBINDEX, TROPONINI in the last 168 hours. BNP (last 3 results) No results for input(s): PROBNP in the last 8760 hours. HbA1C: No results for input(s): HGBA1C in the last 72 hours. CBG: Recent Labs  Lab 02/19/20 2100  GLUCAP 102*   Lipid Profile: No results for input(s): CHOL, HDL, LDLCALC, TRIG, CHOLHDL, LDLDIRECT in the last 72 hours. Thyroid Function Tests: No results for input(s): TSH, T4TOTAL, FREET4, T3FREE, THYROIDAB in the last 72 hours. Anemia Panel: No results for input(s): VITAMINB12, FOLATE, FERRITIN, TIBC, IRON, RETICCTPCT in the last 72 hours. Sepsis Labs: Recent Labs  Lab 02/19/20 2058  LATICACIDVEN 1.4    Recent Results (from the past 240 hour(s))  SARS Coronavirus 2 by RT PCR (hospital order, performed in [facility] hospital lab) Nasopharyngeal Nasopharyngeal Swab     Status: None   Collection Time: 02/19/20 11:09 PM   Specimen: Nasopharyngeal Swab  Result Value Ref Range Status   SARS Coronavirus 2 NEGATIVE NEGATIVE Final    Comment: (NOTE) SARS-CoV-2 target nucleic acids are NOT DETECTED.  The SARS-CoV-2 RNA is generally  detectable in upper and lower respiratory specimens during the acute phase of infection. The lowest concentration of SARS-CoV-2 viral copies this assay can detect is 250 copies / mL. A negative result does not preclude SARS-CoV-2 infection and should not be used as the sole basis for treatment or other patient management decisions.  A negative result may occur with improper specimen collection / handling, submission of specimen other than nasopharyngeal swab, presence of viral mutation(s) within the areas targeted by this assay, and inadequate number of viral copies (<250 copies / mL). A negative result must be combined with clinical observations, patient history, and epidemiological information.  Fact Sheet for Patients:   02/21/20  Fact Sheet for Healthcare Providers: BoilerBrush.com.cy  This test is not yet approved or  cleared by the https://pope.com/ FDA and has been authorized for detection and/or diagnosis of SARS-CoV-2 by FDA under an Emergency Use Authorization (EUA).  This EUA will remain in effect (meaning this test can be used) for the duration of the COVID-19 declaration under Section 564(b)(1) of the Act, 21 U.S.C. section 360bbb-3(b)(1), unless the authorization is terminated or revoked sooner.  Performed at [facility], [address], [place], [place]   SARS Coronavirus 2 by RT PCR (hospital order, performed in [facility] hospital lab) Nasopharyngeal Nasopharyngeal Swab     Status: None   Collection Time: 02/23/20  7:10 PM   Specimen: Nasopharyngeal Swab  Result Value Ref Range Status   SARS Coronavirus 2 NEGATIVE NEGATIVE Final    Comment: (NOTE) SARS-CoV-2 target nucleic acids are NOT DETECTED.  The SARS-CoV-2 RNA is generally detectable in upper and lower respiratory specimens during the acute phase of infection. The lowest concentration of SARS-CoV-2 viral copies this assay can detect is  250 copies / mL. A negative result  does not preclude SARS-CoV-2 infection and should not be used as the sole basis for treatment or other patient management decisions.  A negative result may occur with improper specimen collection / handling, submission of specimen other than nasopharyngeal swab, presence of viral mutation(s) within the areas targeted by this assay, and inadequate number of viral copies (<250 copies / mL). A negative result must be combined with clinical observations, patient history, and epidemiological information.  Fact Sheet for Patients:   BoilerBrush.com.cy  Fact Sheet for Healthcare Providers: https://pope.com/  This test is not yet approved or  cleared by the [place] FDA and has been authorized for detection and/or diagnosis of SARS-CoV-2 by FDA under an Emergency Use Authorization (EUA).  This EUA will remain in effect (meaning this test can be used) for the duration of the COVID-19 declaration under Section 564(b)(1) of the Act, 21 U.S.C. section 360bbb-3(b)(1), unless the authorization is terminated or revoked sooner.  Performed at [facility], [address] [place], [place] [address]          Radiology Studies: No results found.      Scheduled Meds: . arformoterol  15 mcg Nebulization BID  . atorvastatin  40 mg Oral Daily  . busPIRone  10 mg Oral TID  . carvedilol  3.125 mg Oral BID WC  . digoxin  0.125 mg Oral Daily  . enoxaparin (LOVENOX) injection  40 mg Subcutaneous Daily  . fluticasone furoate-vilanterol  1 puff Inhalation Daily  . gabapentin  300 mg Oral BID  . LORazepam  1 mg Intravenous Once  . nicotine  21 mg Transdermal Daily  . phenytoin  100 mg Oral TID  . potassium chloride  40 mEq Oral Once  . QUEtiapine  200 mg Oral Daily  . sacubitril-valsartan  1 tablet Oral BID  . sodium chloride  2 g Oral BID WC  . torsemide  20 mg Oral Daily   Continuous  Infusions:    LOS: 3 days    Time spent: 35 mins.More than 50% of that time was spent in counseling and/or coordination of care.      Burnadette Pop, MD [facility] P6/24/2021, 7:49 AM

## 2020-02-27 LAB — BASIC METABOLIC PANEL
Anion gap: 12 (ref 5–15)
Anion gap: 11 (ref 5–15)
BUN: 12 mg/dL (ref 6–20)
BUN: 10 mg/dL (ref 6–20)
CO2: 22 mmol/L (ref 22–32)
CO2: 27 mmol/L (ref 22–32)
Calcium: 8.4 mg/dL — ABNORMAL LOW (ref 8.9–10.3)
Calcium: 8.9 mg/dL (ref 8.9–10.3)
Chloride: 94 mmol/L — ABNORMAL LOW (ref 98–111)
Chloride: 93 mmol/L — ABNORMAL LOW (ref 98–111)
Creatinine, Ser: 0.54 mg/dL — ABNORMAL LOW (ref 0.61–1.24)
Creatinine, Ser: 0.54 mg/dL — ABNORMAL LOW (ref 0.61–1.24)
GFR calc Af Amer: >60 mL/min (ref >60)
GFR calc Af Amer: >60 mL/min (ref >60)
GFR calc non Af Amer: >60 mL/min (ref >60)
GFR calc non Af Amer: >60 mL/min (ref >60)
Glucose, Bld: 129 mg/dL — ABNORMAL HIGH (ref 70–99)
Glucose, Bld: 96 mg/dL (ref 70–99)
Potassium: 5.6 mmol/L — ABNORMAL HIGH (ref 3.5–5.1)
Potassium: 4.2 mmol/L (ref 3.5–5.1)
Sodium: 128 mmol/L — ABNORMAL LOW (ref 135–145)
Sodium: 131 mmol/L — ABNORMAL LOW (ref 135–145)
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
## 2020-02-27 NOTE — Progress Notes (Signed)
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  He was also found to be hyponatremic on presentation.He had left AGAINST MEDICAL ADVICE on 6/19.  On presentation he was also hypokalemic/hypomagnesemic.  He also had seizure episodes while in the waiting room.  Patient reported not taking Dilantin at home.Sodium level has improved now and currently he is alert and oriented, seizure-free Due to unsafe home environment ,we are trying to place him in ALF.  Medically stable for discharge.  Social worker assisting on placement.   Assessment & Plan:   Principal Problem:   Hyponatremia Active Problems:   Chronic systolic CHF (congestive heart failure) (HCC)   Mixed hyperlipidemia   COPD (chronic obstructive pulmonary disease) (HCC)   Seizure disorder (HCC)   Paranoid schizophrenia (HCC)   Hypokalemia   Hypomagnesemia   Suicidal ideation   Hyponatremia: Suspected to be from hypotonic hypovolemic hyponatremia from dehydration on admission.  He was recently discharged from here after being managed for hyponatremia.  He was on torsemide, metolazone at home, also drinking alcohol.  He complained of dizziness upon standing.  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  He is on phenytoin ,Seroquel.  His  baseline sodium level is around 125-126.   Seizure disorder:Had seizure episodes in the hospital while in the waiting room.   Suspected to be from noncompliance .  He was supposed to be on Dilantin at home which he was not taking.  Dilantin has been restarted.  He was given a loading dose of fosphenytoin.  Currently he is alert and  oriented  Suicidal ideation/schizophrenia: He has history of schizophrenia.  There was concern for suicidal ideation so psychiatry was consulted.  He denies any suicidal ideation now.  Psychiatry does not recommend inpatient psychiatric admission. Currently on BuSpar, Neurontin, Invega, Xanax as needed and outpatient psychiatry follow-up.  Dose of Seroquel decreased to 200 mg  Prolonged QTC: Prolonged QTC on presentation.  Improved to 450 as per last EKG .  Dose of Seroquel decreased to 200 mg HS  Chronic systolic congestive heart failure: Appeared dry on  presentation.  Diuretic therapy were held and given IV fluids.  On Entresto/Coreg /digoxin which are being continued.    His echocardiogram done on May 2021 showed ejection fraction around 25-30%, global hypokinesis, right ventricular volume overload.Dose of torsemide decreased to 20 mg daily. We stopped metolazone. He is currently euvolemic  COPD: Not on home oxygen.  Currently stable.  On inhalers at home.  Hypokalemia/Hyperkalemis: Potassium  Supplemented and corrected but  today he is hypokalemic.  Most likely this is from hemolysis.  I will repeat BMP.  Hypomagnesemia: Most likely secondary to alcohol intake.  Supplemented   Unsafe home environment: Electrical engineer working on placement in ALF          DVT prophylaxis: Lovenox Code Status: Full code Family Communication: Discussed with patient Status is: Inpatient  Remains inpatient appropriate because:Unsafe DC plan   Dispo: The patient is from: Home              Anticipated d/c is to:ALF  Anticipated d/c date is: When placement is fixed              Patient currently is medically stable for discharge    Consultants: None  Procedures:None  Antimicrobials: None Anti-infectives (From admission, onward)   None      Subjective: Patient seen and examined at the bedside this morning.  Comfortable.  Sitting on the chair.  Denies any new  complaints.  Objective: Vitals:   02/26/20 1300 02/26/20 1926 02/26/20 2346 02/27/20 0752  BP: 120/74 94/64 117/81 103/81  Pulse:  99 82 96  Resp: 18 16 17 18   Temp: 98 F (36.7 C) 97.6 F (36.4 C) 98.5 F (36.9 C) 98.2 F (36.8 C)  TempSrc: Oral Oral Oral Oral  SpO2:  92% 100% 100%    Intake/Output Summary (Last 24 hours) at 02/27/2020 1125 Last data filed at 02/26/2020 2100 Gross per 24 hour  Intake 800 ml  Output 900 ml  Net -100 ml   There were no vitals filed for this visit.  Examination:  General exam: Appears calm and comfortable ,Not in distress,average built HEENT:PERRL,Oral mucosa moist, Ear/Nose normal on gross exam Respiratory system: Bilateral equal air entry, normal vesicular breath sounds, no wheezes or crackles  Cardiovascular system: S1 & S2 heard, RRR. No JVD, murmurs, rubs, gallops or clicks. Gastrointestinal system: Abdomen is nondistended, soft and nontender. No organomegaly or masses felt. Normal bowel sounds heard. Central nervous system: Alert and oriented. No focal neurological deficits. Extremities: No edema, no clubbing ,no cyanosis, distal peripheral pulses palpable. Skin: No rashes, lesions or ulcers,no icterus ,no pallor   Data Reviewed: I have personally reviewed following labs and imaging studies  CBC: Recent Labs  Lab 02/23/20 1106 02/24/20 0412  WBC 10.9* 7.9  NEUTROABS  --  4.2  HGB 12.9* 12.5*  HCT 40.3 39.2  MCV 62.5* 62.6*  PLT 243 215   Basic Metabolic Panel: Recent Labs  Lab 02/21/20 0334 02/23/20 1106 02/23/20 1544 02/23/20 1910 02/24/20 0030 02/24/20 0030 02/24/20 0412 02/24/20 0412 02/24/20 0814 02/24/20 1159 02/25/20 0357 02/25/20 0811 02/26/20 0234 02/27/20 0608  NA 132*   < > 121*   < > 124*   < > 124*   < > 125* 124*  --  130* 129* 128*  K 3.4*   < >  --    < > 2.8*   < > 4.2   < > 3.0* 4.9  --  3.7 3.4* 5.6*  CL 87*   < >  --    < > 83*   < > 85*   < > 86* 86*  --  95* 97* 94*  CO2 33*   < >  --    <  > 29   < > 28   < > 26 26  --  22 26 22   GLUCOSE 101*   < >  --    < > 89   < > 80   < > 87 110*  --  103* 98 129*  BUN 20   < >  --    < > 10   < > 11   < > 9 10  --  11 15 12   CREATININE 0.63   < >  --    < > 0.48*   < > 0.56*   < > 0.58* 0.49*  --  0.50* 0.64 0.54*  CALCIUM 9.4   < >  --    < > 8.8*   < >  8.9   < > 9.1 8.7*  --  8.6* 8.5* 8.4*  MG 2.1  --  1.4*  --  1.7  --  1.8  --   --   --  1.9  --   --   --    < > = values in this interval not displayed.   GFR: Estimated Creatinine Clearance: 113.1 mL/min (A) (by C-G formula based on SCr of 0.54 mg/dL (L)). Liver Function Tests: Recent Labs  Lab 02/23/20 1106 02/24/20 0412  AST 31 37  ALT 21 20  ALKPHOS 294* 266*  BILITOT 1.4* 1.2  PROT 7.7 7.0  ALBUMIN 3.6 3.3*   No results for input(s): LIPASE, AMYLASE in the last 168 hours. No results for input(s): AMMONIA in the last 168 hours. Coagulation Profile: No results for input(s): INR, PROTIME in the last 168 hours. Cardiac Enzymes: No results for input(s): CKTOTAL, CKMB, CKMBINDEX, TROPONINI in the last 168 hours. BNP (last 3 results) No results for input(s): PROBNP in the last 8760 hours. HbA1C: No results for input(s): HGBA1C in the last 72 hours. CBG: No results for input(s): GLUCAP in the last 168 hours. Lipid Profile: No results for input(s): CHOL, HDL, LDLCALC, TRIG, CHOLHDL, LDLDIRECT in the last 72 hours. Thyroid Function Tests: No results for input(s): TSH, T4TOTAL, FREET4, T3FREE, THYROIDAB in the last 72 hours. Anemia Panel: No results for input(s): VITAMINB12, FOLATE, FERRITIN, TIBC, IRON, RETICCTPCT in the last 72 hours. Sepsis Labs: No results for input(s): PROCALCITON, LATICACIDVEN in the last 168 hours.  Recent Results (from the past 240 hour(s))  SARS Coronavirus 2 by RT PCR (hospital order, performed in [facility] hospital lab) Nasopharyngeal Nasopharyngeal Swab     Status: None   Collection Time: 02/19/20 11:09 PM   Specimen: Nasopharyngeal Swab    Result Value Ref Range Status   SARS Coronavirus 2 NEGATIVE NEGATIVE Final    Comment: (NOTE) SARS-CoV-2 target nucleic acids are NOT DETECTED.  The SARS-CoV-2 RNA is generally detectable in upper and lower respiratory specimens during the acute phase of infection. The lowest concentration of SARS-CoV-2 viral copies this assay can detect is 250 copies / mL. A negative result does not preclude SARS-CoV-2 infection and should not be used as the sole basis for treatment or other patient management decisions.  A negative result may occur with improper specimen collection / handling, submission of specimen other than nasopharyngeal swab, presence of viral mutation(s) within the areas targeted by this assay, and inadequate number of viral copies (<250 copies / mL). A negative result must be combined with clinical observations, patient history, and epidemiological information.  Fact Sheet for Patients:   BoilerBrush.com.cy  Fact Sheet for Healthcare Providers: https://pope.com/  This test is not yet approved or  cleared by the [place] FDA and has been authorized for detection and/or diagnosis of SARS-CoV-2 by FDA under an Emergency Use Authorization (EUA).  This EUA will remain in effect (meaning this test can be used) for the duration of the COVID-19 declaration under Section 564(b)(1) of the Act, 21 U.S.C. section 360bbb-3(b)(1), unless the authorization is terminated or revoked sooner.  Performed at [facility], [address], [place], [place] [address]   SARS Coronavirus 2 by RT PCR (hospital order, performed in [facility] hospital lab) Nasopharyngeal Nasopharyngeal Swab     Status: None   Collection Time: 02/23/20  7:10 PM   Specimen: Nasopharyngeal Swab  Result Value Ref Range Status   SARS Coronavirus 2 NEGATIVE NEGATIVE Final  Comment: (NOTE) SARS-CoV-2 target nucleic acids are NOT DETECTED.  The SARS-CoV-2  RNA is generally detectable in upper and lower respiratory specimens during the acute phase of infection. The lowest concentration of SARS-CoV-2 viral copies this assay can detect is 250 copies / mL. A negative result does not preclude SARS-CoV-2 infection and should not be used as the sole basis for treatment or other patient management decisions.  A negative result may occur with improper specimen collection / handling, submission of specimen other than nasopharyngeal swab, presence of viral mutation(s) within the areas targeted by this assay, and inadequate number of viral copies (<250 copies / mL). A negative result must be combined with clinical observations, patient history, and epidemiological information.  Fact Sheet for Patients:   BoilerBrush.com.cy  Fact Sheet for Healthcare Providers: https://pope.com/  This test is not yet approved or  cleared by the [place] FDA and has been authorized for detection and/or diagnosis of SARS-CoV-2 by FDA under an Emergency Use Authorization (EUA).  This EUA will remain in effect (meaning this test can be used) for the duration of the COVID-19 declaration under Section 564(b)(1) of the Act, 21 U.S.C. section 360bbb-3(b)(1), unless the authorization is terminated or revoked sooner.  Performed at [facility], [address], [place], [place] [address]          Radiology Studies: No results found.      Scheduled Meds: . atorvastatin  40 mg Oral Daily  . busPIRone  10 mg Oral TID  . carvedilol  3.125 mg Oral BID WC  . digoxin  0.125 mg Oral Daily  . enoxaparin (LOVENOX) injection  40 mg Subcutaneous Daily  . fluticasone furoate-vilanterol  1 puff Inhalation Daily  . gabapentin  300 mg Oral BID  . nicotine  21 mg Transdermal Daily  . phenytoin  100 mg Oral TID  . QUEtiapine  200 mg Oral Daily  . sacubitril-valsartan  1 tablet Oral BID  . torsemide  20 mg Oral Daily    Continuous Infusions:    LOS: 4 days    Time spent: 35 mins.More than 50% of that time was spent in counseling and/or coordination of care.      Burnadette Pop, MD [facility] P6/25/2021, 11:25 AM

## 2020-02-28 DIAGNOSIS — R569 Unspecified convulsions: Secondary | ICD-10-CM

## 2020-02-28 DIAGNOSIS — Z9114 Patient's other noncompliance with medication regimen: Secondary | ICD-10-CM

## 2020-02-28 DIAGNOSIS — R7889 Finding of other specified substances, not normally found in blood: Secondary | ICD-10-CM

## 2020-02-28 LAB — BASIC METABOLIC PANEL
Anion gap: 9 (ref 5–15)
BUN: 17 mg/dL (ref 6–20)
CO2: 28 mmol/L (ref 22–32)
Calcium: 9 mg/dL (ref 8.9–10.3)
Chloride: 97 mmol/L — ABNORMAL LOW (ref 98–111)
Creatinine, Ser: 0.58 mg/dL — ABNORMAL LOW (ref 0.61–1.24)
GFR calc Af Amer: >60 mL/min (ref >60)
GFR calc non Af Amer: >60 mL/min (ref >60)
Glucose, Bld: 107 mg/dL — ABNORMAL HIGH (ref 70–99)
Potassium: 3.7 mmol/L (ref 3.5–5.1)
Sodium: 134 mmol/L — ABNORMAL LOW (ref 135–145)

## 2020-02-28 NOTE — Progress Notes (Signed)
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  He was also found to be hyponatremic on presentation.He had left AGAINST MEDICAL ADVICE on 6/19.  On presentation he was also hypokalemic/hypomagnesemic.  He also had seizure episodes while in the waiting room.  Patient reported not taking Dilantin at home.Sodium level has improved now and currently he is alert and oriented, seizure-free Due to unsafe home environment ,we are trying to place him in ALF.  Medically stable for discharge.  Social worker assisting on placement   Assessment & Plan:   Principal Problem:   Hyponatremia Active Problems:   Chronic systolic CHF (congestive heart failure) (HCC)   Mixed hyperlipidemia   COPD (chronic obstructive pulmonary disease) (HCC)   Seizure disorder (HCC)   Paranoid schizophrenia (HCC)   Hypokalemia   Hypomagnesemia   Suicidal ideation  Hyponatremia: Suspected to be from hypotonic hypovolemic hyponatremia from dehydration on admission.  He was recently discharged from here after being managed for hyponatremia.  He was on torsemide, metolazone at home, also drinking alcohol.  He complained of dizziness upon standing.  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  He is on phenytoin ,Seroquel.  His  baseline sodium level is around 125-126.   Seizure disorder:Had seizure episodes in the hospital while in the waiting room.   Suspected to be from noncompliance .  He was supposed to be on Dilantin at home which he was not taking.  Dilantin has been restarted.  He was given a loading dose of fosphenytoin.  Currently he is alert and  oriented  Suicidal ideation/schizophrenia: He has history of schizophrenia.  There was concern for suicidal ideation so psychiatry was consulted.  He denies any suicidal ideation now.  Psychiatry does not recommend inpatient psychiatric admission. Currently on BuSpar, Neurontin, Invega, Xanax as needed and outpatient psychiatry follow-up.  Dose of Seroquel decreased to 200 mg  Prolonged QTC: Prolonged QTC on presentation.  Improved to 450 as per last EKG .  Dose of Seroquel decreased to 200 mg HS  Chronic systolic congestive heart failure: Appeared dry on  presentation.  Diuretic therapy were held and given IV fluids.  On Entresto/Coreg /digoxin which are being continued.    His echocardiogram done on May 2021 showed ejection fraction around 25-30%, global hypokinesis, right ventricular volume overload.Dose of torsemide decreased to 20 mg daily. We stopped metolazone. He is currently euvolemic  COPD: Not on home oxygen.  Currently stable.  On inhalers at home.  Hypokalemia/Hyperkalemis: Potassium  Supplemented and corrected but  today he is hypokalemic.  Most likely this is from hemolysis.  I will repeat BMP.  Hypomagnesemia: Most likely secondary to alcohol intake.  Supplemented   Unsafe home environment: Engineer, civil (consulting) working on placement in ALF      DVT prophylaxis: Lovenox Code Status: Full code Family Communication: Discussed with patient Status is: Inpatient  Remains inpatient appropriate because:Unsafe DC plan, APS involved   Dispo: The patient is from: Home  Anticipated d/c is to:ALF  Anticipated d/c date is: When placement is fixed  Patient currently is medically stable for discharge       Code Status Orders  (  From admission, onward)         Start     Ordered   02/23/20 2302  Full code  Continuous        02/23/20 2301        Code Status History    Date Active Date Inactive Code Status Order  ID Comments User Context   02/19/2020 2344 02/21/2020 1252 Full Code [id number]  Vianne Bulls, MD ED   01/22/2020 0758 01/28/2020 0134 Full Code [id number]  Norval Morton, MD ED   Advance Care Planning Activity          Procedures:  DG Chest 2 View  Result Date: 02/19/2020 CLINICAL DATA:  Weakness EXAM: CHEST - 2 VIEW COMPARISON:  01/22/2020 FINDINGS: Cardiomegaly. No confluent opacities, effusions or edema. No acute bony abnormality. IMPRESSION: Cardiomegaly.  No active disease. Electronically Signed   By: Rolm Baptise M.D.   On: 02/19/2020 21:26   CT Head Wo Contrast  Result Date: 02/23/2020 CLINICAL DATA:  Generalized weakness. EXAM: CT HEAD WITHOUT CONTRAST TECHNIQUE: Contiguous axial images were obtained from the base of the skull through the vertex without intravenous contrast. COMPARISON:  February 19, 2020 FINDINGS: Brain: No evidence of acute infarction, hemorrhage, hydrocephalus, extra-axial collection or mass lesion/mass effect. A stable, approximately 5.5 cm x 4.6 cm area of CSF attenuation is seen within the right occipital lobe. There is no evidence of associated mass effect or midline shift. Vascular: No hyperdense vessel or unexpected calcification. Skull: Normal. Negative for fracture or focal lesion. Sinuses/Orbits: No acute finding. Other: None. IMPRESSION: 1. No acute intracranial abnormality. 2. Stable right occipital lobe arachnoid cyst. Electronically Signed   By: Virgina Norfolk M.D.   On: 02/23/2020 18:34   CT HEAD WO CONTRAST  Result Date: 02/19/2020 CLINICAL DATA:  Numbness in bilateral hands and feet. EXAM: CT HEAD WITHOUT CONTRAST TECHNIQUE: Contiguous axial images were obtained from the base of the skull through the vertex without intravenous contrast. COMPARISON:  None. FINDINGS: Brain: 5.8 cm cystic structure noted in the right posterior cerebral hemisphere compatible with arachnoid cyst. No hemorrhage, hydrocephalus, acute infarction or midline shift. Vascular: No  hyperdense vessel or unexpected calcification. Skull: No acute calvarial abnormality. Sinuses/Orbits: Visualized paranasal sinuses and mastoids clear. Orbital soft tissues unremarkable. Other: None IMPRESSION: 5.8 cm posterior right arachnoid cyst. No acute intracranial abnormality. Electronically Signed   By: Rolm Baptise M.D.   On: 02/19/2020 21:46   CT Cervical Spine Wo Contrast  Result Date: 02/23/2020 CLINICAL DATA:  Generalized weakness. EXAM: CT CERVICAL SPINE WITHOUT CONTRAST TECHNIQUE: Multidetector CT imaging of the cervical spine was performed without intravenous contrast. Multiplanar CT image reconstructions were also generated. COMPARISON:  None. FINDINGS: Alignment: Normal. Skull base and vertebrae: No acute fracture. No primary bone lesion or focal pathologic process. Soft tissues and spinal canal: No prevertebral fluid or swelling. No visible canal hematoma. Disc levels: Moderate severity multilevel endplate sclerosis is seen. Moderate severity intervertebral disc space narrowing is seen at the level of C3-C4. Mild multilevel intervertebral disc space narrowing is seen throughout the remainder of the cervical spine. Normal bilateral multilevel facet joints are seen. Upper chest: Negative. Other: None. IMPRESSION: Multilevel degenerative disc disease of the cervical spine, most prominent at the level of C3-C4. Electronically Signed   By: Virgina Norfolk M.D.   On: 02/23/2020 18:38   DG Chest Portable 1 View  Result Date: 02/23/2020 CLINICAL DATA:  Seizure today. No chest symptoms. Pt states wanting to hurt his selfseizure EXAM: PORTABLE  CHEST 1 VIEW COMPARISON:  None. FINDINGS: Stable mild enlarged cardiac silhouette. Low lung volumes. No focal infiltrate. No pneumothorax. No pulmonary edema. No acute osseous abnormality. IMPRESSION: 1. Low lung volumes. 2. No acute findings. Electronically Signed   By: Genevive Bi M.D.   On: 02/23/2020 18:16     Antimicrobials:    none   Subjective: No acute events overnight, examine dat bedaside -in chiar  Objective: Vitals:   02/28/20 0328 02/28/20 0819 02/28/20 0842 02/28/20 1126  BP: 126/82 124/75  97/79  Pulse: 89 82 88 81  Resp: 14 16 18 16   Temp: 98.5 F (36.9 C) 97.9 F (36.6 C)  98.2 F (36.8 C)  TempSrc: Oral Oral  Oral  SpO2: 96% 98% 99% 100%    Intake/Output Summary (Last 24 hours) at 02/28/2020 1214 Last data filed at 02/28/2020 0354 Gross per 24 hour  Intake 600 ml  Output 400 ml  Net 200 ml   There were no vitals filed for this visit.  Examination:  General exam: Appears calm and comfortable ,Not in distress, in bedside chair HEENT:PERRL,Oral mucosa moist, Ear/Nose normal on gross exam Respiratory system: Bilateral equal air entry, normal  breath sounds, no wheezes or crackles  Cardiovascular system: S1 & S2 heard, RRR. No JVD, murmurs, rubs, gallops or clicks. Gastrointestinal system: Abdomen is nondistended, soft and nontender. No organomegaly or masses felt. Normal bowel sounds heard. Central nervous system: Alert and oriented. No focal neurological deficits. Extremities: No edema, no clubbing ,no cyanosis, distal peripheral pulses palpable. Skin: No rashes, lesions or ulcers,no icterus ,no pallor    Data Reviewed: I have personally reviewed following labs and imaging studies  CBC: Recent Labs  Lab 02/23/20 1106 02/24/20 0412  WBC 10.9* 7.9  NEUTROABS  --  4.2  HGB 12.9* 12.5*  HCT 40.3 39.2  MCV 62.5* 62.6*  PLT 243 215   Basic Metabolic Panel: Recent Labs  Lab 02/23/20 1544 02/23/20 1910 02/24/20 0030 02/24/20 0030 02/24/20 0412 02/24/20 0814 02/24/20 1159 02/25/20 0357 02/25/20 0811 02/26/20 0234 02/27/20 0608 02/27/20 1118 02/28/20 0347  NA 121*   < > 124*   < > 124*   < >   < >  --  130* 129* 128* 131* 134*  K  --    < > 2.8*   < > 4.2   < >   < >  --  3.7 3.4* 5.6* 4.2 3.7  CL  --    < > 83*   < > 85*   < >   < >  --  95* 97* 94* 93* 97*  CO2   --    < > 29   < > 28   < >   < >  --  22 26 22 27 28   GLUCOSE  --    < > 89   < > 80   < >   < >  --  103* 98 129* 96 107*  BUN  --    < > 10   < > 11   < >   < >  --  11 15 12 10 17   CREATININE  --    < > 0.48*   < > 0.56*   < >   < >  --  0.50* 0.64 0.54* 0.54* 0.58*  CALCIUM  --    < > 8.8*   < > 8.9   < >   < >  --  8.6* 8.5*  8.4* 8.9 9.0  MG 1.4*  --  1.7  --  1.8  --   --  1.9  --   --   --   --   --    < > = values in this interval not displayed.   GFR: Estimated Creatinine Clearance: 113.1 mL/min (A) (by C-G formula based on SCr of 0.58 mg/dL (L)). Liver Function Tests: Recent Labs  Lab 02/23/20 1106 02/24/20 0412  AST 31 37  ALT 21 20  ALKPHOS 294* 266*  BILITOT 1.4* 1.2  PROT 7.7 7.0  ALBUMIN 3.6 3.3*   No results for input(s): LIPASE, AMYLASE in the last 168 hours. No results for input(s): AMMONIA in the last 168 hours. Coagulation Profile: No results for input(s): INR, PROTIME in the last 168 hours. Cardiac Enzymes: No results for input(s): CKTOTAL, CKMB, CKMBINDEX, TROPONINI in the last 168 hours. BNP (last 3 results) No results for input(s): PROBNP in the last 8760 hours. HbA1C: No results for input(s): HGBA1C in the last 72 hours. CBG: No results for input(s): GLUCAP in the last 168 hours. Lipid Profile: No results for input(s): CHOL, HDL, LDLCALC, TRIG, CHOLHDL, LDLDIRECT in the last 72 hours. Thyroid Function Tests: No results for input(s): TSH, T4TOTAL, FREET4, T3FREE, THYROIDAB in the last 72 hours. Anemia Panel: No results for input(s): VITAMINB12, FOLATE, FERRITIN, TIBC, IRON, RETICCTPCT in the last 72 hours. Sepsis Labs: No results for input(s): PROCALCITON, LATICACIDVEN in the last 168 hours.  Recent Results (from the past 240 hour(s))  SARS Coronavirus 2 by RT PCR (hospital order, performed in [facility] hospital lab) Nasopharyngeal Nasopharyngeal Swab     Status: None   Collection Time: 02/19/20 11:09 PM   Specimen: Nasopharyngeal Swab  Result  Value Ref Range Status   SARS Coronavirus 2 NEGATIVE NEGATIVE Final    Comment: (NOTE) SARS-CoV-2 target nucleic acids are NOT DETECTED.  The SARS-CoV-2 RNA is generally detectable in upper and lower respiratory specimens during the acute phase of infection. The lowest concentration of SARS-CoV-2 viral copies this assay can detect is 250 copies / mL. A negative result does not preclude SARS-CoV-2 infection and should not be used as the sole basis for treatment or other patient management decisions.  A negative result may occur with improper specimen collection / handling, submission of specimen other than nasopharyngeal swab, presence of viral mutation(s) within the areas targeted by this assay, and inadequate number of viral copies (<250 copies / mL). A negative result must be combined with clinical observations, patient history, and epidemiological information.  Fact Sheet for Patients:   BoilerBrush.com.cy  Fact Sheet for Healthcare Providers: https://pope.com/  This test is not yet approved or  cleared by the [place] FDA and has been authorized for detection and/or diagnosis of SARS-CoV-2 by FDA under an Emergency Use Authorization (EUA).  This EUA will remain in effect (meaning this test can be used) for the duration of the COVID-19 declaration under Section 564(b)(1) of the Act, 21 U.S.C. section 360bbb-3(b)(1), unless the authorization is terminated or revoked sooner.  Performed at [facility], [address], [place], [place] [address]   SARS Coronavirus 2 by RT PCR (hospital order, performed in [facility] hospital lab) Nasopharyngeal Nasopharyngeal Swab     Status: None   Collection Time: 02/23/20  7:10 PM   Specimen: Nasopharyngeal Swab  Result Value Ref Range Status   SARS Coronavirus 2 NEGATIVE NEGATIVE Final    Comment: (NOTE) SARS-CoV-2 target nucleic acids are NOT DETECTED.  The SARS-CoV-2 RNA is  generally detectable in upper and lower respiratory specimens during the acute phase of infection. The lowest concentration of SARS-CoV-2 viral copies this assay can detect is 250 copies / mL. A negative result does not preclude SARS-CoV-2 infection and should not be used as the sole basis for treatment or other patient management decisions.  A negative result may occur with improper specimen collection / handling, submission of specimen other than nasopharyngeal swab, presence of viral mutation(s) within the areas targeted by this assay, and inadequate number of viral copies (<250 copies / mL). A negative result must be combined with clinical observations, patient history, and epidemiological information.  Fact Sheet for Patients:   BoilerBrush.com.cy  Fact Sheet for Healthcare Providers: https://pope.com/  This test is not yet approved or  cleared by the [place] FDA and has been authorized for detection and/or diagnosis of SARS-CoV-2 by FDA under an Emergency Use Authorization (EUA).  This EUA will remain in effect (meaning this test can be used) for the duration of the COVID-19 declaration under Section 564(b)(1) of the Act, 21 U.S.C. section 360bbb-3(b)(1), unless the authorization is terminated or revoked sooner.  Performed at [facility], [address] [place], [place] [address]          Radiology Studies: No results found.      Scheduled Meds: . atorvastatin  40 mg Oral Daily  . busPIRone  10 mg Oral TID  . carvedilol  3.125 mg Oral BID WC  . digoxin  0.125 mg Oral Daily  . enoxaparin (LOVENOX) injection  40 mg Subcutaneous Daily  . fluticasone furoate-vilanterol  1 puff Inhalation Daily  . gabapentin  300 mg Oral BID  . nicotine  21 mg Transdermal Daily  . phenytoin  100 mg Oral TID  . QUEtiapine  200 mg Oral Daily  . sacubitril-valsartan  1 tablet Oral BID  . torsemide  20 mg Oral Daily    Continuous Infusions:   LOS: 5 days    Time spent: 35 min    Burke Keels, MD [facility]  If 7PM-7AM, please contact night-coverage  02/28/2020, 12:14 PM

## 2020-02-29 MED ORDER — FUROSEMIDE 10 MG/ML IJ SOLN
20.0000 mg | Freq: Once | INTRAMUSCULAR | Status: DC
  Filled 2020-02-29 (×3): qty 4

## 2020-02-29 MED ORDER — ZOLPIDEM TARTRATE 5 MG PO TABS
5.0000 mg | ORAL_TABLET | Freq: Once | ORAL | Status: AC
  Administered 2020-02-29: 5 mg via ORAL
  Filled 2020-02-29: qty 1

## 2020-02-29 NOTE — Progress Notes (Signed)
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 He was also found to be hyponatremic on presentation.He had left AGAINST MEDICAL ADVICE on 6/19. On presentation he was alsohypokalemic/hypomagnesemic. He also had seizure episodes while in the waiting room. Patient reported not taking Dilantin at home.Sodium level has improved now and currently he is alert and oriented, seizure-free Due to unsafe home environment ,we are trying to place him inALF. Medically stable for discharge. Social worker assisting on placement   Assessment & Plan:   Principal Problem:   Hyponatremia Active Problems:   Chronic systolic CHF (congestive heart failure) (HCC)   Mixed hyperlipidemia   COPD (chronic obstructive pulmonary disease) (HCC)   Seizure disorder (HCC)   Paranoid schizophrenia ([place])   Hypokalemia   Hypomagnesemia   Suicidal ideation   Noncompliance with medications   Subtherapeutic serum dilantin level   Seizure ([place])   Hyponatremia:Suspected to be from hypotonic hypovolemic hyponatremia from dehydrationon admission. He was recently discharged from here after being managed for hyponatremia. He was on torsemide, metolazone at home, also drinking alcohol. He complained of dizziness upon standing. 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 He is on phenytoin ,Seroquel.   Seizure disorder:Had seizure episodes in the hospital while in the waiting room. Suspected to be from noncompliance . He was supposed to be on Dilantin at home which he was not taking. Dilantin has been restarted. He was given a loading dose  of fosphenytoin. Currently he is alert and oriented  Suicidal ideation/schizophrenia:He has history of schizophrenia. There was concern for suicidal ideation so psychiatry was consulted. He denies any suicidal ideation now. Psychiatry does not recommend inpatient psychiatric admission. Currently on BuSpar, Neurontin, Invega, Xanax as needed and outpatient psychiatry follow-up. Dose of Seroquel decreased to 200 mg  Prolonged GHW:EXHBZJIRC QTC on presentation. Improved to 450 as per last EKG. Dose of Seroquel decreased to 200 mg HS  Chronic systolic congestive heart failure: Appeared dry on presentation. Diuretic therapy were held and given IV fluids. On Entresto/Coreg /digoxin which are being continued. His echocardiogram done on May 2021 showed ejection fraction around 25-30%, global hypokinesis, right ventricular volume overload.Dose oftorsemide decreased to 20 mg daily. We stopped metolazone. He iscurrently euvolemic  COPD: Not on home oxygen. Currently stable. On inhalers at home.  Hypokalemia/Hyperkalemis:Potassium Supplemented and corrected buttoday he is hypokalemic. Most likely this is from hemolysis. I will repeat BMP.  Hypomagnesemia:Most likely secondary to alcohol intake. Supplemented  Unsafe home environment:Social Development worker, community working on placement in ALF  DVT prophylaxis: Lovenox SQ  Code Status: FULL    Code Status Orders  (From admission, onward)         Start     Ordered   02/23/20 2302  Full code  Continuous        02/23/20 2301        Code Status History    Date Active Date Inactive Code Status Order ID Comments User Context   02/19/2020 2344 02/21/2020 1252 Full Code [id number]  Jim Bulls, MD ED   01/22/2020 0758 01/28/2020 0134 Full Code [id number]  Norval Morton, MD ED   Advance Care Planning Activity  Disposition Plan:    Family Communication:Discussed with patient Status is: Inpatient  Remains  inpatient appropriate because:Unsafe DC plan, APS involved   Dispo: The patient is from: Home Anticipated d/c is to:ALF Anticipated d/c date is: When placement is fixed Patient currently is medically stable for discharge  Consults called: None Admission status: Inpatient   Consultants:   AS ABOVE  Procedures:  DG Chest 2 View  Result Date: 02/19/2020 CLINICAL DATA:  Weakness EXAM: CHEST - 2 VIEW COMPARISON:  01/22/2020 FINDINGS: Cardiomegaly. No confluent opacities, effusions or edema. No acute bony abnormality. IMPRESSION: Cardiomegaly.  No active disease. Electronically Signed   By: Charlett Nose M.D.   On: 02/19/2020 21:26   CT Head Wo Contrast  Result Date: 02/23/2020 CLINICAL DATA:  Generalized weakness. EXAM: CT HEAD WITHOUT CONTRAST TECHNIQUE: Contiguous axial images were obtained from the base of the skull through the vertex without intravenous contrast. COMPARISON:  February 19, 2020 FINDINGS: Brain: No evidence of acute infarction, hemorrhage, hydrocephalus, extra-axial collection or mass lesion/mass effect. A stable, approximately 5.5 cm x 4.6 cm area of CSF attenuation is seen within the right occipital lobe. There is no evidence of associated mass effect or midline shift. Vascular: No hyperdense vessel or unexpected calcification. Skull: Normal. Negative for fracture or focal lesion. Sinuses/Orbits: No acute finding. Other: None. IMPRESSION: 1. No acute intracranial abnormality. 2. Stable right occipital lobe arachnoid cyst. Electronically Signed   By: Aram Candela M.D.   On: 02/23/2020 18:34   CT HEAD WO CONTRAST  Result Date: 02/19/2020 CLINICAL DATA:  Numbness in bilateral hands and feet. EXAM: CT HEAD WITHOUT CONTRAST TECHNIQUE: Contiguous axial images were obtained from the base of the skull through the vertex without intravenous contrast. COMPARISON:  None. FINDINGS: Brain: 5.8 cm cystic structure noted in the right posterior  cerebral hemisphere compatible with arachnoid cyst. No hemorrhage, hydrocephalus, acute infarction or midline shift. Vascular: No hyperdense vessel or unexpected calcification. Skull: No acute calvarial abnormality. Sinuses/Orbits: Visualized paranasal sinuses and mastoids clear. Orbital soft tissues unremarkable. Other: None IMPRESSION: 5.8 cm posterior right arachnoid cyst. No acute intracranial abnormality. Electronically Signed   By: Charlett Nose M.D.   On: 02/19/2020 21:46   CT Cervical Spine Wo Contrast  Result Date: 02/23/2020 CLINICAL DATA:  Generalized weakness. EXAM: CT CERVICAL SPINE WITHOUT CONTRAST TECHNIQUE: Multidetector CT imaging of the cervical spine was performed without intravenous contrast. Multiplanar CT image reconstructions were also generated. COMPARISON:  None. FINDINGS: Alignment: Normal. Skull base and vertebrae: No acute fracture. No primary bone lesion or focal pathologic process. Soft tissues and spinal canal: No prevertebral fluid or swelling. No visible canal hematoma. Disc levels: Moderate severity multilevel endplate sclerosis is seen. Moderate severity intervertebral disc space narrowing is seen at the level of C3-C4. Mild multilevel intervertebral disc space narrowing is seen throughout the remainder of the cervical spine. Normal bilateral multilevel facet joints are seen. Upper chest: Negative. Other: None. IMPRESSION: Multilevel degenerative disc disease of the cervical spine, most prominent at the level of C3-C4. Electronically Signed   By: Aram Candela M.D.   On: 02/23/2020 18:38   DG Chest Portable 1 View  Result Date: 02/23/2020 CLINICAL DATA:  Seizure today. No chest symptoms. Pt states wanting to hurt his selfseizure EXAM: PORTABLE CHEST 1 VIEW COMPARISON:  None. FINDINGS: Stable mild enlarged cardiac silhouette. Low lung volumes. No focal infiltrate. No pneumothorax. No pulmonary edema. No acute osseous abnormality. IMPRESSION: 1. Low lung volumes. 2. No  acute findings. Electronically Signed  By: Genevive Bi M.D.   On: 02/23/2020 18:16     Antimicrobials:   NONE    Subjective: rESTING IN BEDSIDE CHAIR, NO ACUTE EVENTS, AWAITING ALF  Objective: Vitals:   02/29/20 0302 02/29/20 0727 02/29/20 0807 02/29/20 1225  BP: 128/81  128/81 107/68  Pulse: 99  89 85  Resp: 16  18 16   Temp: 98.3 F (36.8 C)  98.3 F (36.8 C) 98.4 F (36.9 C)  TempSrc: Oral  Oral Oral  SpO2: 99% 99% 99% 100%    Intake/Output Summary (Last 24 hours) at 02/29/2020 1343 Last data filed at 02/29/2020 9357 Gross per 24 hour  Intake 120 ml  Output 1300 ml  Net -1180 ml   There were no vitals filed for this visit.  Examination:   General exam:Appears calm and comfortable ,Not in distress, in bedside chair HEENT:PERRL,Oral mucosa moist, Ear/Nose normal on gross exam Respiratory system: Bilateral equal air entry, normal  breath sounds, no wheezes or crackles  Cardiovascular system:S1 &S2 heard, RRR. No JVD, murmurs, rubs, gallops or clicks. Gastrointestinal system:Abdomen is nondistended, soft and nontender. No organomegaly or masses felt. Normal bowel sounds heard. Central nervous system:Alert and oriented. No focal neurological deficits. Extremities: No edema, no clubbing ,no cyanosis, distal peripheral pulses palpable. Skin: No rashes, lesions or ulcers,no icterus ,no pallor    Data Reviewed: I have personally reviewed following labs and imaging studies  CBC: Recent Labs  Lab 02/23/20 1106 02/24/20 0412  WBC 10.9* 7.9  NEUTROABS  --  4.2  HGB 12.9* 12.5*  HCT 40.3 39.2  MCV 62.5* 62.6*  PLT 243 215   Basic Metabolic Panel: Recent Labs  Lab 02/23/20 1544 02/23/20 1910 02/24/20 0030 02/24/20 0030 02/24/20 0412 02/24/20 0814 02/24/20 1159 02/25/20 0357 02/25/20 0811 02/26/20 0234 02/27/20 0608 02/27/20 1118 02/28/20 0347  NA 121*   < > 124*   < > 124*   < >   < >  --  130* 129* 128* 131* 134*  K  --    < > 2.8*   < >  4.2   < >   < >  --  3.7 3.4* 5.6* 4.2 3.7  CL  --    < > 83*   < > 85*   < >   < >  --  95* 97* 94* 93* 97*  CO2  --    < > 29   < > 28   < >   < >  --  22 26 22 27 28   GLUCOSE  --    < > 89   < > 80   < >   < >  --  103* 98 129* 96 107*  BUN  --    < > 10   < > 11   < >   < >  --  11 15 12 10 17   CREATININE  --    < > 0.48*   < > 0.56*   < >   < >  --  0.50* 0.64 0.54* 0.54* 0.58*  CALCIUM  --    < > 8.8*   < > 8.9   < >   < >  --  8.6* 8.5* 8.4* 8.9 9.0  MG 1.4*  --  1.7  --  1.8  --   --  1.9  --   --   --   --   --    < > = values in this  interval not displayed.   GFR: Estimated Creatinine Clearance: 113.1 mL/min (A) (by C-G formula based on SCr of 0.58 mg/dL (L)). Liver Function Tests: Recent Labs  Lab 02/23/20 1106 02/24/20 0412  AST 31 37  ALT 21 20  ALKPHOS 294* 266*  BILITOT 1.4* 1.2  PROT 7.7 7.0  ALBUMIN 3.6 3.3*   No results for input(s): LIPASE, AMYLASE in the last 168 hours. No results for input(s): AMMONIA in the last 168 hours. Coagulation Profile: No results for input(s): INR, PROTIME in the last 168 hours. Cardiac Enzymes: No results for input(s): CKTOTAL, CKMB, CKMBINDEX, TROPONINI in the last 168 hours. BNP (last 3 results) No results for input(s): PROBNP in the last 8760 hours. HbA1C: No results for input(s): HGBA1C in the last 72 hours. CBG: No results for input(s): GLUCAP in the last 168 hours. Lipid Profile: No results for input(s): CHOL, HDL, LDLCALC, TRIG, CHOLHDL, LDLDIRECT in the last 72 hours. Thyroid Function Tests: No results for input(s): TSH, T4TOTAL, FREET4, T3FREE, THYROIDAB in the last 72 hours. Anemia Panel: No results for input(s): VITAMINB12, FOLATE, FERRITIN, TIBC, IRON, RETICCTPCT in the last 72 hours. Sepsis Labs: No results for input(s): PROCALCITON, LATICACIDVEN in the last 168 hours.  Recent Results (from the past 240 hour(s))  SARS Coronavirus 2 by RT PCR (hospital order, performed in [facility] hospital lab) Nasopharyngeal  Nasopharyngeal Swab     Status: None   Collection Time: 02/19/20 11:09 PM   Specimen: Nasopharyngeal Swab  Result Value Ref Range Status   SARS Coronavirus 2 NEGATIVE NEGATIVE Final    Comment: (NOTE) SARS-CoV-2 target nucleic acids are NOT DETECTED.  The SARS-CoV-2 RNA is generally detectable in upper and lower respiratory specimens during the acute phase of infection. The lowest concentration of SARS-CoV-2 viral copies this assay can detect is 250 copies / mL. A negative result does not preclude SARS-CoV-2 infection and should not be used as the sole basis for treatment or other patient management decisions.  A negative result may occur with improper specimen collection / handling, submission of specimen other than nasopharyngeal swab, presence of viral mutation(s) within the areas targeted by this assay, and inadequate number of viral copies (<250 copies / mL). A negative result must be combined with clinical observations, patient history, and epidemiological information.  Fact Sheet for Patients:   BoilerBrush.com.cy  Fact Sheet for Healthcare Providers: https://pope.com/  This test is not yet approved or  cleared by the [place] FDA and has been authorized for detection and/or diagnosis of SARS-CoV-2 by FDA under an Emergency Use Authorization (EUA).  This EUA will remain in effect (meaning this test can be used) for the duration of the COVID-19 declaration under Section 564(b)(1) of the Act, 21 U.S.C. section 360bbb-3(b)(1), unless the authorization is terminated or revoked sooner.  Performed at [facility], [address] [place], [place] [address]   SARS Coronavirus 2 by RT PCR (hospital order, performed in [facility] hospital lab) Nasopharyngeal Nasopharyngeal Swab     Status: None   Collection Time: 02/23/20  7:10 PM   Specimen: Nasopharyngeal Swab  Result Value Ref Range Status   SARS Coronavirus 2  NEGATIVE NEGATIVE Final    Comment: (NOTE) SARS-CoV-2 target nucleic acids are NOT DETECTED.  The SARS-CoV-2 RNA is generally detectable in upper and lower respiratory specimens during the acute phase of infection. The lowest concentration of SARS-CoV-2 viral copies this assay can detect is 250 copies / mL. A negative result does not preclude SARS-CoV-2 infection  and should not be used as the sole basis for treatment or other patient management decisions.  A negative result may occur with improper specimen collection / handling, submission of specimen other than nasopharyngeal swab, presence of viral mutation(s) within the areas targeted by this assay, and inadequate number of viral copies (<250 copies / mL). A negative result must be combined with clinical observations, patient history, and epidemiological information.  Fact Sheet for Patients:   BoilerBrush.com.cy  Fact Sheet for Healthcare Providers: https://pope.com/  This test is not yet approved or  cleared by the [place] FDA and has been authorized for detection and/or diagnosis of SARS-CoV-2 by FDA under an Emergency Use Authorization (EUA).  This EUA will remain in effect (meaning this test can be used) for the duration of the COVID-19 declaration under Section 564(b)(1) of the Act, 21 U.S.C. section 360bbb-3(b)(1), unless the authorization is terminated or revoked sooner.  Performed at [facility], [address], [place], [place] [address]          Radiology Studies: No results found.      Scheduled Meds: . atorvastatin  40 mg Oral Daily  . busPIRone  10 mg Oral TID  . carvedilol  3.125 mg Oral BID WC  . digoxin  0.125 mg Oral Daily  . enoxaparin (LOVENOX) injection  40 mg Subcutaneous Daily  . fluticasone furoate-vilanterol  1 puff Inhalation Daily  . furosemide  20 mg Intravenous Once  . gabapentin  300 mg Oral BID  . nicotine  21 mg  Transdermal Daily  . phenytoin  100 mg Oral TID  . QUEtiapine  200 mg Oral Daily  . sacubitril-valsartan  1 tablet Oral BID  . torsemide  20 mg Oral Daily   Continuous Infusions:   LOS: 6 days    Time spent: 65 MIN    Burke Keels, MD [facility]  If 7PM-7AM, please contact night-coverage  02/29/2020, 1:43 PM

## 2020-03-01 DIAGNOSIS — J410 Simple chronic bronchitis: Secondary | ICD-10-CM

## 2020-03-01 LAB — BASIC METABOLIC PANEL
Anion gap: 9 (ref 5–15)
BUN: 9 mg/dL (ref 6–20)
CO2: 28 mmol/L (ref 22–32)
Calcium: 9.3 mg/dL (ref 8.9–10.3)
Chloride: 95 mmol/L — ABNORMAL LOW (ref 98–111)
Creatinine, Ser: 0.53 mg/dL — ABNORMAL LOW (ref 0.61–1.24)
GFR calc Af Amer: >60 mL/min (ref >60)
GFR calc non Af Amer: >60 mL/min (ref >60)
Glucose, Bld: 126 mg/dL — ABNORMAL HIGH (ref 70–99)
Potassium: 3.5 mmol/L (ref 3.5–5.1)
Sodium: 132 mmol/L — ABNORMAL LOW (ref 135–145)

## 2020-03-01 LAB — PHENYTOIN LEVEL, TOTAL: Phenytoin Lvl: <2.5 ug/mL — ABNORMAL LOW (ref 10.0–20.0)

## 2020-03-01 NOTE — Progress Notes (Signed)
[facility]                                                                              Patient Demographics  Jim Dunn, is a 54 y.o. male, DOB - 05-Jan-1966, OEU:[id number]  Admit date - 02/23/2020   Admitting Physician Marinda Elk, MD  Outpatient Primary MD for the patient is System, Pcp Not In  Outpatient specialists:   LOS - 7  days   Medical records reviewed and are as summarized below:    Chief Complaint  Patient presents with  . 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 He was also found to be hyponatremic on presentation.He had left AGAINST MEDICAL ADVICE on 6/19. On presentation he was alsohypokalemic/hypomagnesemic. He also had seizure episodes while in the waiting room. Patient reported not taking Dilantin at home.Sodium level has improved now and currently he is alert and oriented, seizure-free Due to unsafe home environment ,we are trying to place him inALF. Medically stable for discharge. Social worker assisting on placement  Assessment & Plan    Principal Problem:   Hyponatremia -Suspected to be from hypotonic hypovolemic hyponatremia from dehydration -Patient was on torsemide, metolazone at home, also drinking alcohol, orthostatic and dizzy upon standing. -Urine osmolality 261, serum osmolality 252 on 6/21 -Sodium improving, 132 also on phenytoin and Seroquel  Active Problems:  Seizure disorder -Had an episode of seizures while in the waiting room, suspected to be noncompliant with Dilantin -Received loading dose of fosphenytoin, Dilantin has been restarted, currently alert and oriented, no further seizures  Suicidal ideation, with  underlying history of schizophrenia -Psychiatry was consulted, denies any suicidal ideation now, psychiatry does not recommend inpatient psych admission -Currently on BuSpar, Neurontin, Invega, Xanax as needed, recommended outpatient psych follow-up -Seroquel decreased to 200 mg daily  Prolonged QTC - Improved to 450 as per last EKG. Dose of Seroquel decreased to 200 mg HS  Chronic systolic CHF -Appear to be dry on presentation, diuretic therapy was held and patient received IV fluids -2D echo May/2021 showed EF 25 to 30%, global hypokinesis, right ventricular volume overload. -Metolazone discontinued, continue torsemide 20 mg daily, currently euvolemic  COPD:  -Not on home oxygen. Currently stable.  -On inhalers at home.  Hypokalemia, hypomagnesemia -Improved  Unsafe home environment -TOC team working on placement in ALF  Code Status: Full CODE STATUS DVT Prophylaxis:  Lovenox Family Communication: Discussed all imaging results, lab results, explained to the patient    Disposition Plan:     Status is: Inpatient  Remains inpatient appropriate because:Inpatient level of care appropriate due to severity of illness   Dispo: The patient is from: Home              Anticipated d/c is to: ALF              Anticipated d/c date is:  1 day              Patient currently is medically stable to d/c.      Time Spent in minutes 25 minutes  Procedures:  None  Consultants:   None  Antimicrobials:   Anti-infectives (From admission, onward)   None          Medications  Scheduled Meds: . atorvastatin  40 mg Oral Daily  . busPIRone  10 mg Oral TID  . carvedilol  3.125 mg Oral BID WC  . digoxin  0.125 mg Oral Daily  . enoxaparin (LOVENOX) injection  40 mg Subcutaneous Daily  . fluticasone furoate-vilanterol  1 puff Inhalation Daily  . furosemide  20 mg Intravenous Once  . gabapentin  300 mg Oral BID  . nicotine  21 mg Transdermal Daily  . phenytoin  100 mg  Oral TID  . QUEtiapine  200 mg Oral Daily  . sacubitril-valsartan  1 tablet Oral BID  . torsemide  20 mg Oral Daily   Continuous Infusions: PRN Meds:.acetaminophen **OR** acetaminophen, ALPRAZolam, ipratropium-albuterol, ondansetron **OR** ondansetron (ZOFRAN) IV, polyethylene glycol, traMADol      Subjective:   Xzavian Semmel was seen and examined today.  Sitting in the chair, denies any specific complaints.  Overnight no acute issues.  Tolerating diet Patient denies dizziness, chest pain, shortness of breath, abdominal pain, N/V/D/C, new weakness, numbess, tingling. No fevers  Objective:   Vitals:   02/29/20 2336 03/01/20 0301 03/01/20 0821 03/01/20 0840  BP: 122/68 129/81  (!) 137/98  Pulse: 89 92  91  Resp: 18 18  18   Temp: 98.6 F (37 C) 98.5 F (36.9 C)  97.6 F (36.4 C)  TempSrc: Oral Oral  Oral  SpO2: 100% 100% 98% 99%    Intake/Output Summary (Last 24 hours) at 03/01/2020 1045 Last data filed at 02/29/2020 2300 Gross per 24 hour  Intake 820 ml  Output --  Net 820 ml     Wt Readings from Last 3 Encounters:  02/20/20 82.4 kg  01/27/20 90.9 kg     Exam  General: Alert and oriented x 3, NAD  Cardiovascular: S1 S2 auscultated, no murmurs, RRR  Respiratory: Clear to auscultation bilaterally, no wheezing, rales or rhonchi  Gastrointestinal: Soft, nontender, nondistended, + bowel sounds  Ext: no pedal edema bilaterally  Neuro: No new deficits  Musculoskeletal: No digital cyanosis, clubbing  Skin: No rashes  Psych: Normal affect and demeanor, alert and oriented x3    Data Reviewed:  I have personally reviewed following labs and imaging studies  Micro Results Recent Results (from the past 240 hour(s))  SARS Coronavirus 2 by RT PCR (hospital order, performed in [facility] hospital lab) Nasopharyngeal Nasopharyngeal Swab     Status: None   Collection Time: 02/23/20  7:10 PM   Specimen: Nasopharyngeal Swab  Result Value Ref Range Status   SARS  Coronavirus 2 NEGATIVE NEGATIVE Final    Comment: (NOTE) SARS-CoV-2 target nucleic acids are NOT DETECTED.  The SARS-CoV-2 RNA is generally detectable in upper and lower respiratory specimens during the acute phase of infection. The lowest concentration of SARS-CoV-2 viral copies this assay can detect is 250 copies / mL. A negative result does not preclude SARS-CoV-2 infection and should not be used as the sole basis for treatment or other patient management decisions.  A negative result may occur with improper specimen collection / handling, submission of specimen other than nasopharyngeal swab, presence of viral mutation(s) within the areas targeted by this  assay, and inadequate number of viral copies (<250 copies / mL). A negative result must be combined with clinical observations, patient history, and epidemiological information.  Fact Sheet for Patients:   BoilerBrush.com.cy  Fact Sheet for Healthcare Providers: https://pope.com/  This test is not yet approved or  cleared by the [place] FDA and has been authorized for detection and/or diagnosis of SARS-CoV-2 by FDA under an Emergency Use Authorization (EUA).  This EUA will remain in effect (meaning this test can be used) for the duration of the COVID-19 declaration under Section 564(b)(1) of the Act, 21 U.S.C. section 360bbb-3(b)(1), unless the authorization is terminated or revoked sooner.  Performed at [facility], [address] [place], [place] [address]     Radiology Reports DG Chest 2 View  Result Date: 02/19/2020 CLINICAL DATA:  Weakness EXAM: CHEST - 2 VIEW COMPARISON:  01/22/2020 FINDINGS: Cardiomegaly. No confluent opacities, effusions or edema. No acute bony abnormality. IMPRESSION: Cardiomegaly.  No active disease. Electronically Signed   By: Charlett Nose M.D.   On: 02/19/2020 21:26   CT Head Wo Contrast  Result Date: 02/23/2020 CLINICAL DATA:   Generalized weakness. EXAM: CT HEAD WITHOUT CONTRAST TECHNIQUE: Contiguous axial images were obtained from the base of the skull through the vertex without intravenous contrast. COMPARISON:  February 19, 2020 FINDINGS: Brain: No evidence of acute infarction, hemorrhage, hydrocephalus, extra-axial collection or mass lesion/mass effect. A stable, approximately 5.5 cm x 4.6 cm area of CSF attenuation is seen within the right occipital lobe. There is no evidence of associated mass effect or midline shift. Vascular: No hyperdense vessel or unexpected calcification. Skull: Normal. Negative for fracture or focal lesion. Sinuses/Orbits: No acute finding. Other: None. IMPRESSION: 1. No acute intracranial abnormality. 2. Stable right occipital lobe arachnoid cyst. Electronically Signed   By: Aram Candela M.D.   On: 02/23/2020 18:34   CT HEAD WO CONTRAST  Result Date: 02/19/2020 CLINICAL DATA:  Numbness in bilateral hands and feet. EXAM: CT HEAD WITHOUT CONTRAST TECHNIQUE: Contiguous axial images were obtained from the base of the skull through the vertex without intravenous contrast. COMPARISON:  None. FINDINGS: Brain: 5.8 cm cystic structure noted in the right posterior cerebral hemisphere compatible with arachnoid cyst. No hemorrhage, hydrocephalus, acute infarction or midline shift. Vascular: No hyperdense vessel or unexpected calcification. Skull: No acute calvarial abnormality. Sinuses/Orbits: Visualized paranasal sinuses and mastoids clear. Orbital soft tissues unremarkable. Other: None IMPRESSION: 5.8 cm posterior right arachnoid cyst. No acute intracranial abnormality. Electronically Signed   By: Charlett Nose M.D.   On: 02/19/2020 21:46   CT Cervical Spine Wo Contrast  Result Date: 02/23/2020 CLINICAL DATA:  Generalized weakness. EXAM: CT CERVICAL SPINE WITHOUT CONTRAST TECHNIQUE: Multidetector CT imaging of the cervical spine was performed without intravenous contrast. Multiplanar CT image reconstructions  were also generated. COMPARISON:  None. FINDINGS: Alignment: Normal. Skull base and vertebrae: No acute fracture. No primary bone lesion or focal pathologic process. Soft tissues and spinal canal: No prevertebral fluid or swelling. No visible canal hematoma. Disc levels: Moderate severity multilevel endplate sclerosis is seen. Moderate severity intervertebral disc space narrowing is seen at the level of C3-C4. Mild multilevel intervertebral disc space narrowing is seen throughout the remainder of the cervical spine. Normal bilateral multilevel facet joints are seen. Upper chest: Negative. Other: None. IMPRESSION: Multilevel degenerative disc disease of the cervical spine, most prominent at the level of C3-C4. Electronically Signed   By: Aram Candela M.D.   On: 02/23/2020 18:38   DG  Chest Portable 1 View  Result Date: 02/23/2020 CLINICAL DATA:  Seizure today. No chest symptoms. Pt states wanting to hurt his selfseizure EXAM: PORTABLE CHEST 1 VIEW COMPARISON:  None. FINDINGS: Stable mild enlarged cardiac silhouette. Low lung volumes. No focal infiltrate. No pneumothorax. No pulmonary edema. No acute osseous abnormality. IMPRESSION: 1. Low lung volumes. 2. No acute findings. Electronically Signed   By: Genevive Bi M.D.   On: 02/23/2020 18:16    Lab Data:  CBC: Recent Labs  Lab 02/23/20 1106 02/24/20 0412  WBC 10.9* 7.9  NEUTROABS  --  4.2  HGB 12.9* 12.5*  HCT 40.3 39.2  MCV 62.5* 62.6*  PLT 243 215   Basic Metabolic Panel: Recent Labs  Lab 02/23/20 1544 02/23/20 1910 02/24/20 0030 02/24/20 0030 02/24/20 0412 02/24/20 0814 02/25/20 0357 02/25/20 0811 02/26/20 0234 02/27/20 0608 02/27/20 1118 02/28/20 0347 03/01/20 0339  NA 121*   < > 124*   < > 124*   < >  --    < > 129* 128* 131* 134* 132*  K  --    < > 2.8*   < > 4.2   < >  --    < > 3.4* 5.6* 4.2 3.7 3.5  CL  --    < > 83*   < > 85*   < >  --    < > 97* 94* 93* 97* 95*  CO2  --    < > 29   < > 28   < >  --    < > 26  22 27 28 28   GLUCOSE  --    < > 89   < > 80   < >  --    < > 98 129* 96 107* 126*  BUN  --    < > 10   < > 11   < >  --    < > 15 12 10 17 9   CREATININE  --    < > 0.48*   < > 0.56*   < >  --    < > 0.64 0.54* 0.54* 0.58* 0.53*  CALCIUM  --    < > 8.8*   < > 8.9   < >  --    < > 8.5* 8.4* 8.9 9.0 9.3  MG 1.4*  --  1.7  --  1.8  --  1.9  --   --   --   --   --   --    < > = values in this interval not displayed.   GFR: Estimated Creatinine Clearance: 113.1 mL/min (A) (by C-G formula based on SCr of 0.53 mg/dL (L)). Liver Function Tests: Recent Labs  Lab 02/23/20 1106 02/24/20 0412  AST 31 37  ALT 21 20  ALKPHOS 294* 266*  BILITOT 1.4* 1.2  PROT 7.7 7.0  ALBUMIN 3.6 3.3*   No results for input(s): LIPASE, AMYLASE in the last 168 hours. No results for input(s): AMMONIA in the last 168 hours. Coagulation Profile: No results for input(s): INR, PROTIME in the last 168 hours. Cardiac Enzymes: No results for input(s): CKTOTAL, CKMB, CKMBINDEX, TROPONINI in the last 168 hours. BNP (last 3 results) No results for input(s): PROBNP in the last 8760 hours. HbA1C: No results for input(s): HGBA1C in the last 72 hours. CBG: No results for input(s): GLUCAP in the last 168 hours. Lipid Profile: No results for input(s): CHOL, HDL, LDLCALC, TRIG, CHOLHDL, LDLDIRECT in the last 72 hours.  Thyroid Function Tests: No results for input(s): TSH, T4TOTAL, FREET4, T3FREE, THYROIDAB in the last 72 hours. Anemia Panel: No results for input(s): VITAMINB12, FOLATE, FERRITIN, TIBC, IRON, RETICCTPCT in the last 72 hours. Urine analysis:    Component Value Date/Time   COLORURINE YELLOW 02/23/2020 1910   APPEARANCEUR CLEAR 02/23/2020 1910   LABSPEC 1.006 02/23/2020 1910   PHURINE 7.0 02/23/2020 1910   GLUCOSEU NEGATIVE 02/23/2020 1910   HGBUR NEGATIVE 02/23/2020 1910   BILIRUBINUR NEGATIVE 02/23/2020 1910   KETONESUR NEGATIVE 02/23/2020 1910   PROTEINUR NEGATIVE 02/23/2020 1910   NITRITE NEGATIVE  02/23/2020 1910   LEUKOCYTESUR NEGATIVE 02/23/2020 1910     Makoto Sellitto M.D. [facility] 03/01/2020, 10:45 AM   Call night coverage person covering after 7pm

## 2020-03-02 DIAGNOSIS — I272 Pulmonary hypertension, unspecified: Secondary | ICD-10-CM

## 2020-03-02 LAB — BASIC METABOLIC PANEL
Anion gap: 9 (ref 5–15)
BUN: 9 mg/dL (ref 6–20)
CO2: 28 mmol/L (ref 22–32)
Calcium: 9.3 mg/dL (ref 8.9–10.3)
Chloride: 94 mmol/L — ABNORMAL LOW (ref 98–111)
Creatinine, Ser: 0.53 mg/dL — ABNORMAL LOW (ref 0.61–1.24)
GFR calc Af Amer: >60 mL/min (ref >60)
GFR calc non Af Amer: >60 mL/min (ref >60)
Glucose, Bld: 94 mg/dL (ref 70–99)
Potassium: 4.2 mmol/L (ref 3.5–5.1)
Sodium: 131 mmol/L — ABNORMAL LOW (ref 135–145)

## 2020-03-02 MED ORDER — MELATONIN 3 MG PO TABS
6.0000 mg | ORAL_TABLET | Freq: Every evening | ORAL | Status: DC | PRN
  Administered 2020-03-02: 6 mg via ORAL
  Filled 2020-03-02: qty 2

## 2020-03-02 NOTE — Progress Notes (Signed)
PROGRESS NOTE    Jim Dunn  HKV:[id number] DOB: Sep 19, 1965 DOA: 02/23/2020 PCP: System, Pcp Not In     Brief Narrative:  54 y.o. BM PMHx   Seizures, paranoid schizophrenia, COPD, chronic systolic CHF, cholelithiasis, tobacco abuse,  Presenting to the emergency department with some vague complaints that include generalized weakness and fatigue, as well as numbness in the bilateral hands and feet.  Unfortunately, the patient is a poor historian.  He has been experiencing these symptoms for weeks to a month now, had been in an SNF after hospital discharge late last month, reports being back at home with his mother for the past week, and has grown more weak in general and more fatigued in the past few days but began to feel this way closer to a month ago.  The bilateral hands and feet numbness has also been present for weeks to a month and seems to be intermittent.  He denies headache or focal weakness.  He denies any fall or trauma.  He denies fevers, chills, or chest pain.  He denies any lower extremity swelling at this time but notes that he had swollen legs when he was recently admitted.  Per report of ED personnel, patient's mother advised the patient to go to the ED for possible dehydration.  ED Course: Upon arrival to the ED, patient is found to be afebrile, saturating well on room air, and with initial blood pressure 77/67.  EKG features sinus rhythm with LPF B and ST-T abnormalities that appear similar to prior.  Chest x-rays negative for acute cardiopulmonary disease.  CT head negative for acute findings but notable for a arachnoid cyst.  Chemistry panel features potassium 2.8 and sodium 124, down from 132 at time of recent discharge.  CBC with chronic microcytosis.  Patient was given 500 cc of saline, as well as oral and IV potassium in the ED.  COVID-19 screening test is pending   Subjective: A/O x4, negative CP, negative S OB, negative abdominal pain.  No suicidal ideation, no homicidal  ideation   Assessment & Plan: Covid vaccination;   Principal Problem:   Hyponatremia Active Problems:   Chronic systolic CHF (congestive heart failure) (HCC)   Mixed hyperlipidemia   COPD (chronic obstructive pulmonary disease) (HCC)   Seizure disorder (HCC)   Paranoid schizophrenia (HCC)   Hypokalemia   Hypomagnesemia   Suicidal ideation   Noncompliance with medications   Subtherapeutic serum dilantin level   Seizure (HCC)    Hyponatremia -Suspected to be from hypotonic hypovolemic hyponatremia from dehydration -Patient was on torsemide, metolazone at home, also drinking alcohol, orthostatic and dizzy upon standing. -Urine osmolality 261, serum osmolality 252 on 6/21 -Sodium improving, 132 also on phenytoin and Seroquel -Currently asymptomatic.  Seizure disorder -Had an episode of seizures while in the waiting room, suspected to be noncompliant with Dilantin -Received loading dose of fosphenytoin, Dilantin has been restarted, currently alert and oriented, no further seizures -Phenytoin 100 mg TID -Discussed increasing phenytoin with neurology/pharmacy in a.m. phenytoin level 6/28 <2.5  Suicidal ideation, with underlying history of schizophrenia -Psychiatry was consulted, denies any suicidal ideation now, psychiatry does not recommend inpatient psych admission -Currently on BuSpar, Neurontin, Invega, Xanax as needed, recommended outpatient psych follow-up -Seroquel decreased to 200 mg daily -6/29 patient has not been a threat to himself or staff however was presented with IVC paperwork today initiated by his mother by the [employer].  Have consulted psychiatry unsure whether this is legal.  Can we resend this paperwork?  Prolonged QTC - Improved to 450 as per last EKG.  -Seroquel decreased to 200 mg HS  Chronic systolic CHF -Appear to be dry on presentation, diuretic therapy was held and patient received IV fluids -2D echo May/2021 showed EF 25 to 30%, global  hypokinesis, right ventricular volume overload. -Metolazone discontinued, continue torsemide 20 mg daily, currently euvolemic -Strict in and out -Daily weight  Pulmonary HTN -CHF  COPD:  -Not on home oxygen. Currently stable.  -On inhalers at home.  Hypokalemia,  -Potassium goal> 4  Hypomagnesemia -Magnesium goal> 2  Unsafe home environment? -6/29 psychiatry has been consulted.  Patient currently not suicidal or homicidal.  Unsure of what is not safe in patient's home environment. -TOC team working on placement in ALF    DVT prophylaxis: Lovenox Code Status: Full Family Communication:  Status is: Inpatient    Dispo: The patient is from: Home              Anticipated d/c is to:??              Anticipated d/c date is:??                Patient currently medically stable      Consultants:  Psychiatry pending   Procedures/Significant Events:     I have personally reviewed and interpreted all radiology studies and my findings are as above.  VENTILATOR SETTINGS:    Cultures   Antimicrobials:    Devices    LINES / TUBES:      Continuous Infusions:   Objective: Vitals:   03/01/20 2100 03/02/20 0300 03/02/20 0802 03/02/20 1210  BP: (!) 142/65 130/84 (!) 130/91 101/82  Pulse: 98 89 (!) 106 98  Resp:  18 20 20   Temp: 98.3 F (36.8 C) 98.4 F (36.9 C) 98.6 F (37 C) 97.8 F (36.6 C)  TempSrc: Oral Oral Oral Oral  SpO2:  98% 99% 97%    Intake/Output Summary (Last 24 hours) at 03/02/2020 1406 Last data filed at 03/02/2020 0300 Gross per 24 hour  Intake 363 ml  Output 425 ml  Net -62 ml   There were no vitals filed for this visit.  Examination:  General: A/O x4, no acute respiratory distress Eyes: negative scleral hemorrhage, negative anisocoria, negative icterus ENT: Negative Runny nose, negative gingival bleeding, Neck:  Negative scars, masses, torticollis, lymphadenopathy, JVD Lungs: Clear to auscultation bilaterally without  wheezes or crackles Cardiovascular: Regular rate and rhythm without murmur gallop or rub normal S1 and S2 Abdomen: negative abdominal pain, nondistended, positive soft, bowel sounds, no rebound, no ascites, no appreciable mass Extremities: No significant cyanosis, clubbing, or edema bilateral lower extremities Skin: Negative rashes, lesions, ulcers Psychiatric:  Negative depression, negative anxiety, negative fatigue, negative mania  Central nervous system:  Cranial nerves II through XII intact, tongue/uvula midline, all extremities muscle strength 5/5, sensation intact throughout,  negative dysarthria, negative expressive aphasia, negative receptive aphasia.  .     Data Reviewed: Care during the described time interval was provided by me .  I have reviewed this patient's available data, including medical history, events of note, physical examination, and all test results as part of my evaluation.  CBC: No results for input(s): WBC, NEUTROABS, HGB, HCT, MCV, PLT in the last 168 hours. Basic Metabolic Panel: Recent Labs  Lab 02/25/20 0357 02/25/20 0811 02/27/20 0608 02/27/20 1118 02/28/20 0347 03/01/20 0339 03/02/20 0335  NA  --    < > 128* 131* 134* 132* 131*  K  --    < >  5.6* 4.2 3.7 3.5 4.2  CL  --    < > 94* 93* 97* 95* 94*  CO2  --    < > 22 27 28 28 28   GLUCOSE  --    < > 129* 96 107* 126* 94  BUN  --    < > 12 10 17 9 9   CREATININE  --    < > 0.54* 0.54* 0.58* 0.53* 0.53*  CALCIUM  --    < > 8.4* 8.9 9.0 9.3 9.3  MG 1.9  --   --   --   --   --   --    < > = values in this interval not displayed.   GFR: Estimated Creatinine Clearance: 113.1 mL/min (A) (by C-G formula based on SCr of 0.53 mg/dL (L)). Liver Function Tests: No results for input(s): AST, ALT, ALKPHOS, BILITOT, PROT, ALBUMIN in the last 168 hours. No results for input(s): LIPASE, AMYLASE in the last 168 hours. No results for input(s): AMMONIA in the last 168 hours. Coagulation Profile: No results for  input(s): INR, PROTIME in the last 168 hours. Cardiac Enzymes: No results for input(s): CKTOTAL, CKMB, CKMBINDEX, TROPONINI in the last 168 hours. BNP (last 3 results) No results for input(s): PROBNP in the last 8760 hours. HbA1C: No results for input(s): HGBA1C in the last 72 hours. CBG: No results for input(s): GLUCAP in the last 168 hours. Lipid Profile: No results for input(s): CHOL, HDL, LDLCALC, TRIG, CHOLHDL, LDLDIRECT in the last 72 hours. Thyroid Function Tests: No results for input(s): TSH, T4TOTAL, FREET4, T3FREE, THYROIDAB in the last 72 hours. Anemia Panel: No results for input(s): VITAMINB12, FOLATE, FERRITIN, TIBC, IRON, RETICCTPCT in the last 72 hours. Sepsis Labs: No results for input(s): PROCALCITON, LATICACIDVEN in the last 168 hours.  Recent Results (from the past 240 hour(s))  SARS Coronavirus 2 by RT PCR (hospital order, performed in [facility] hospital lab) Nasopharyngeal Nasopharyngeal Swab     Status: None   Collection Time: 02/23/20  7:10 PM   Specimen: Nasopharyngeal Swab  Result Value Ref Range Status   SARS Coronavirus 2 NEGATIVE NEGATIVE Final    Comment: (NOTE) SARS-CoV-2 target nucleic acids are NOT DETECTED.  The SARS-CoV-2 RNA is generally detectable in upper and lower respiratory specimens during the acute phase of infection. The lowest concentration of SARS-CoV-2 viral copies this assay can detect is 250 copies / mL. A negative result does not preclude SARS-CoV-2 infection and should not be used as the sole basis for treatment or other patient management decisions.  A negative result may occur with improper specimen collection / handling, submission of specimen other than nasopharyngeal swab, presence of viral mutation(s) within the areas targeted by this assay, and inadequate number of viral copies (<250 copies / mL). A negative result must be combined with clinical observations, patient history, and epidemiological information.  Fact Sheet  for Patients:   [facility]  Fact Sheet for Healthcare Providers: 02/25/20  This test is not yet approved or  cleared by the [employer] FDA and has been authorized for detection and/or diagnosis of SARS-CoV-2 by FDA under an Emergency Use Authorization (EUA).  This EUA will remain in effect (meaning this test can be used) for the duration of the COVID-19 declaration under Section 564(b)(1) of the Act, 21 U.S.C. section 360bbb-3(b)(1), unless the authorization is terminated or revoked sooner.  Performed at [facility], [address], [place], [address] [place]  Radiology Studies: No results found.      Scheduled Meds: . atorvastatin  40 mg Oral Daily  . busPIRone  10 mg Oral TID  . carvedilol  3.125 mg Oral BID WC  . digoxin  0.125 mg Oral Daily  . enoxaparin (LOVENOX) injection  40 mg Subcutaneous Daily  . fluticasone furoate-vilanterol  1 puff Inhalation Daily  . furosemide  20 mg Intravenous Once  . gabapentin  300 mg Oral BID  . nicotine  21 mg Transdermal Daily  . phenytoin  100 mg Oral TID  . QUEtiapine  200 mg Oral Daily  . sacubitril-valsartan  1 tablet Oral BID  . torsemide  20 mg Oral Daily   Continuous Infusions:   LOS: 8 days    Time spent:40 min    Delainie Chavana, Roselind Messier, MD [facility] Pager [phone]  If 7PM-7AM, please contact night-coverage [email or web address] Password [email or web address] 03/02/2020, 2:06 PM

## 2020-03-02 NOTE — Plan of Care (Signed)
  Problem: Education: Goal: Expressions of having a comfortable level of knowledge regarding the disease process will increase Outcome: Progressing   Problem: Nutrition: Goal: Adequate nutrition will be maintained Outcome: Progressing   Problem: Pain Managment: Goal: General experience of comfort will improve Outcome: Progressing

## 2020-03-02 NOTE — Progress Notes (Signed)
Occupational Therapy Treatment Patient Details Name: Jim Dunn MRN: [id number] DOB: Jul 08, 1966 Today's Date: 03/02/2020    History of present illness Pt is a 54 y/o male admitted secondary to hyponatremia and suicidal ideation. PMH includes CHF, COPD, seizures, and paranoid schizophrenia.    OT comments  Pt making steady progress towards OT goals this session. Session focus on functional mobility and LB ADLs. Overall, pt requires MIN A for functional mobility with SPC. Pt presents with mild balance impairments as noted by 1 LOB during transitions. Pt reports using SPC for past 3 months however required cues for cane mgmt during mobility. Pt completed LB dressing with min guard and presents with baseline cognitive impairments, however pt responding appropriately throughout session. Agree with DC plan below, will follow.    Follow Up Recommendations  Other (comment);Supervision - Intermittent (S wtih all medication and financial management)    Equipment Recommendations       Recommendations for Other Services      Precautions / Restrictions Precautions Precautions: Fall Restrictions Weight Bearing Restrictions: No       Mobility Bed Mobility Overal bed mobility: Needs Assistance Bed Mobility: Supine to Sit     Supine to sit: Min guard     General bed mobility comments: min guard for safety as pt in sidelying upon arrival needing min guard for safety when lowering rail down for pt to progress to EOB  Transfers Overall transfer level: Needs assistance Equipment used: Straight cane Transfers: Sit to/from Stand Sit to Stand: Supervision;Min guard         General transfer comment: pt completed x4 sit<>stands throughout session with min guard to supervision needind cues for safety    Balance Overall balance assessment: Needs assistance Sitting-balance support: No upper extremity supported;Feet supported Sitting balance-Leahy Scale: Good Sitting balance - Comments: able  to reach to BLEs to don shoes with no LOB   Standing balance support: No upper extremity supported;During functional activity Standing balance-Leahy Scale: Fair Standing balance comment: able to don mask with no UE support and gross supervision; noted 1 LOB during ambulation during transitions                           ADL either performed or assessed with clinical judgement   ADL Overall ADL's : Needs assistance/impaired             Lower Body Bathing: Min guard;Sitting/lateral leans Lower Body Bathing Details (indicate cue type and reason): simulated via LB dressing with min guard for safety     Lower Body Dressing: Min guard;Sitting/lateral leans Lower Body Dressing Details (indicate cue type and reason): to don shoes EOB; min guard for safety when reaching to feet Toilet Transfer: Minimal assistance;Ambulation;Supervision/safety Toilet Transfer Details (indicate cue type and reason): pt able to ambulate to toilet with MIN A for balance with SPC; close supervision for safety while standing to void bladder Toileting- Clothing Manipulation and Hygiene: Supervision/safety;Sit to/from stand Toileting - Clothing Manipulation Details (indicate cue type and reason): gross supervision     Functional mobility during ADLs: Minimal assistance;Cane General ADL Comments: pt with mild unsteadiness noted 1 LOB during transitions needing MIN A to self correct. MIN guard for LB ADLS and S for toileting     Vision       Perception     Praxis      Cognition Arousal/Alertness: Awake/alert Behavior During Therapy: WFL for tasks assessed/performed Overall Cognitive Status: History of cognitive impairments - at  baseline                                 General Comments: paranoid schizophrenia at baseline; able to state why pt was in hospital as well as "June, Tuesday and 21" when asked orientation questions        Exercises     Shoulder Instructions        General Comments pt reports neuropathy in hands, pt reports mom with assisting with medication mgmt and IADLs    Pertinent Vitals/ Pain       Pain Assessment: No/denies pain  Home Living                                          Prior Functioning/Environment              Frequency  Min 2X/week        Progress Toward Goals  OT Goals(current goals can now be found in the care plan section)  Progress towards OT goals: Progressing toward goals  Acute Rehab OT Goals Patient Stated Goal: to have somewhere to go  OT Goal Formulation: With patient Time For Goal Achievement: 03/11/20 Potential to Achieve Goals: Good  Plan Discharge plan remains appropriate;Frequency remains appropriate    Co-evaluation                 AM-PAC OT "6 Clicks" Daily Activity     Outcome Measure   Help from another person eating meals?: None Help from another person taking care of personal grooming?: A Little Help from another person toileting, which includes using toliet, bedpan, or urinal?: A Little Help from another person bathing (including washing, rinsing, drying)?: A Little Help from another person to put on and taking off regular upper body clothing?: A Little Help from another person to put on and taking off regular lower body clothing?: A Little 6 Click Score: 19    End of Session Equipment Utilized During Treatment: Gait belt;Other (comment) (SPC)  OT Visit Diagnosis: Unsteadiness on feet (R26.81)   Activity Tolerance Patient tolerated treatment well   Patient Left in bed;with call bell/phone within reach;with bed alarm set;Other (comment) (sitting EOB)   Nurse Communication Mobility status        Time: 8889-1694 OT Time Calculation (min): 14 min  Charges: OT General Charges $OT Visit: 1 Visit OT Treatments $Self Care/Home Management : 8-22 mins  Audery Amel., COTA/L Acute Rehabilitation Services [phone]    Angelina Pih 03/02/2020, 11:40 AM

## 2020-03-02 NOTE — Progress Notes (Signed)
PT Cancellation Note  Patient Details Name: Jim Dunn MRN: [id number] DOB: [birth year]   Cancelled Treatment:    Reason Eval/Treat Not Completed: Patient declined, no reason specified. Pt sleeping when PT entered. Increased time to rouse and open eyes to voice. He requests that PT return another time. Will continue to follow.    Marylynn Pearson 03/02/2020, 1:21 PM   Conni Slipper, PT, DPT Acute Rehabilitation Services Pager: [phone] Office: [phone]

## 2020-03-03 ENCOUNTER — Encounter (HOSPITAL_COMMUNITY): Payer: Medicare PPO

## 2020-03-03 LAB — COMPREHENSIVE METABOLIC PANEL
ALT: 36 U/L (ref 0–44)
AST: 37 U/L (ref 15–41)
Albumin: 3.5 g/dL (ref 3.5–5.0)
Alkaline Phosphatase: 229 U/L — ABNORMAL HIGH (ref 38–126)
Anion gap: 10 (ref 5–15)
BUN: 8 mg/dL (ref 6–20)
CO2: 27 mmol/L (ref 22–32)
Calcium: 9.3 mg/dL (ref 8.9–10.3)
Chloride: 94 mmol/L — ABNORMAL LOW (ref 98–111)
Creatinine, Ser: 0.52 mg/dL — ABNORMAL LOW (ref 0.61–1.24)
GFR calc Af Amer: >60 mL/min (ref >60)
GFR calc non Af Amer: >60 mL/min (ref >60)
Glucose, Bld: 101 mg/dL — ABNORMAL HIGH (ref 70–99)
Potassium: 3.7 mmol/L (ref 3.5–5.1)
Sodium: 131 mmol/L — ABNORMAL LOW (ref 135–145)
Total Bilirubin: 0.8 mg/dL (ref 0.3–1.2)
Total Protein: 7.2 g/dL (ref 6.5–8.1)

## 2020-03-03 LAB — CBC
HCT: 37.5 % — ABNORMAL LOW (ref 39.0–52.0)
Hemoglobin: 11.9 g/dL — ABNORMAL LOW (ref 13.0–17.0)
MCH: 20.2 pg — ABNORMAL LOW (ref 26.0–34.0)
MCHC: 31.7 g/dL (ref 30.0–36.0)
MCV: 63.7 fL — ABNORMAL LOW (ref 80.0–100.0)
Platelets: 202 10*3/uL (ref 150–400)
RBC: 5.89 MIL/uL — ABNORMAL HIGH (ref 4.22–5.81)
RDW: 26.2 % — ABNORMAL HIGH (ref 11.5–15.5)
WBC: 7.7 10*3/uL (ref 4.0–10.5)
nRBC: 0 % (ref 0.0–0.2)

## 2020-03-03 LAB — MAGNESIUM: Magnesium: 1.8 mg/dL (ref 1.7–2.4)

## 2020-03-03 LAB — PHOSPHORUS: Phosphorus: 5.1 mg/dL — ABNORMAL HIGH (ref 2.5–4.6)

## 2020-03-03 NOTE — Progress Notes (Signed)
[facility]                                                                              Patient Demographics  Larri Hamel, is a 54 y.o. male, DOB - Oct 26, 1965, OIT:[id number]  Admit date - 02/23/2020   Admitting Physician Marinda Elk, MD  Outpatient Primary MD for the patient is System, Pcp Not In  Outpatient specialists:   LOS - 9  days   Medical records reviewed and are as summarized below:    Chief Complaint  Patient presents with  . 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 He was also found to be hyponatremic on presentation.He had left AGAINST MEDICAL ADVICE on 6/19. On presentation he was alsohypokalemic/hypomagnesemic. He also had seizure episodes while in the waiting room. Patient reported not taking Dilantin at home.Sodium level has improved now and currently he is alert and oriented, seizure-free Due to unsafe home environment, SW assisting to place an ALF for group home. Medically stable for discharge. Social worker assisting on placement  Assessment & Plan    Principal Problem:   Hyponatremia -Suspected to be from hypotonic hypovolemic hyponatremia from dehydration -Patient was on torsemide, metolazone at home, also drinking alcohol, orthostatic and dizzy upon standing. -Urine osmolality 261, serum osmolality 252 on 6/21 -Sodium stable 131 on phenytoin, Seroquel  Active Problems:  Seizure disorder -Had an episode of seizures while in the waiting room, suspected to be noncompliant with Dilantin -Received loading dose of fosphenytoin, Dilantin has been restarted -Alert and oriented, no repeat seizures.  Suicidal ideation, with underlying  history of schizophrenia -Psychiatry was consulted, denies any suicidal ideation now, psychiatry does not recommend inpatient psych admission -Currently on BuSpar, Neurontin, Invega, Xanax as needed, recommended outpatient psych follow-up -Seroquel decreased to 200 mg daily -Psychiatry reconsulted as patient was involuntary committed by his mother on 6/29 and served by [facility] -Patient however has not been threatening or danger to self or others while inpatient.  Seen by psychiatry today, no new changes  Prolonged QTC - Improved to 450 as per last EKG. Dose of Seroquel decreased to 200 mg HS  Chronic systolic CHF -Appear to be dry on presentation, diuretic therapy was held and patient received IV fluids -2D echo May/2021 showed EF 25 to 30%, global hypokinesis, right ventricular volume overload. -Metolazone discontinued, continue torsemide 20 mg daily, currently euvolemic  COPD:  -Not on home oxygen. Currently stable.  -On inhalers at home.   Unsafe home environment -TOC team working on placement in ALF or group home  Code Status: Full CODE STATUS DVT Prophylaxis:  Lovenox Family Communication: Discussed all imaging results, lab results, explained to the patient    Disposition Plan:     Status is: Inpatient  Remains inpatient appropriate because:Inpatient level of care appropriate due to severity of illness   Dispo: The patient is  from: Home              Anticipated d/c is to: ALF              Anticipated d/c date is: 1 day              Patient currently is medically stable to d/c.  Discussed with social work, currently assisting with placement      Time Spent in minutes 25 minutes  Procedures:  None  Consultants:   Psychiatry  Antimicrobials:   Anti-infectives (From admission, onward)   None         Medications  Scheduled Meds: . atorvastatin  40 mg Oral Daily  . busPIRone  10 mg Oral TID  . carvedilol  3.125 mg Oral BID WC  . digoxin  0.125 mg  Oral Daily  . enoxaparin (LOVENOX) injection  40 mg Subcutaneous Daily  . fluticasone furoate-vilanterol  1 puff Inhalation Daily  . furosemide  20 mg Intravenous Once  . gabapentin  300 mg Oral BID  . nicotine  21 mg Transdermal Daily  . phenytoin  100 mg Oral TID  . QUEtiapine  200 mg Oral Daily  . sacubitril-valsartan  1 tablet Oral BID  . torsemide  20 mg Oral Daily   Continuous Infusions: PRN Meds:.acetaminophen **OR** acetaminophen, ALPRAZolam, ipratropium-albuterol, melatonin, ondansetron **OR** ondansetron (ZOFRAN) IV, polyethylene glycol, traMADol      Subjective:   Wilma Wuthrich was seen and examined today.  Sitting up in the chair, denied any specific complaints, sitter in the room.  No acute issues overnight.  No fevers or chills.  Tolerating diet.  Patient denies dizziness, chest pain, shortness of breath, abdominal pain, N/V/D/C, new weakness, numbess, tingling.   Objective:   Vitals:   03/03/20 0002 03/03/20 0350 03/03/20 0443 03/03/20 0824  BP: 123/78 (!) 135/95  (!) 153/95  Pulse: 89 88  79  Resp: 16 18    Temp: 98.1 F (36.7 C) (!) 97.4 F (36.3 C)  97.8 F (36.6 C)  TempSrc: Oral Oral  Oral  SpO2: 100% 98%  100%  Weight:   85.7 kg     Intake/Output Summary (Last 24 hours) at 03/03/2020 1359 Last data filed at 03/02/2020 1830 Gross per 24 hour  Intake 720 ml  Output --  Net 720 ml     Wt Readings from Last 3 Encounters:  03/03/20 85.7 kg  02/20/20 82.4 kg  01/27/20 90.9 kg    Physical Exam  General: Alert and oriented x 3, NAD  Cardiovascular: S1 S2 clear, RRR. No pedal edema b/l  Respiratory: CTAB, no wheezing, rales or rhonchi  Gastrointestinal: Soft, nontender, nondistended, NBS  Ext: no pedal edema bilaterally  Neuro: no new deficits  Musculoskeletal: No cyanosis, clubbing  Skin: No rashes  Psych: Normal affect and demeanor, alert and oriented x3    Data Reviewed:  I have personally reviewed following labs and imaging  studies  Micro Results Recent Results (from the past 240 hour(s))  SARS Coronavirus 2 by RT PCR (hospital order, performed in [facility] hospital lab) Nasopharyngeal Nasopharyngeal Swab     Status: None   Collection Time: 02/23/20  7:10 PM   Specimen: Nasopharyngeal Swab  Result Value Ref Range Status   SARS Coronavirus 2 NEGATIVE NEGATIVE Final    Comment: (NOTE) SARS-CoV-2 target nucleic acids are NOT DETECTED.  The SARS-CoV-2 RNA is generally detectable in upper and lower respiratory specimens during the acute phase of infection. The lowest concentration of  SARS-CoV-2 viral copies this assay can detect is 250 copies / mL. A negative result does not preclude SARS-CoV-2 infection and should not be used as the sole basis for treatment or other patient management decisions.  A negative result may occur with improper specimen collection / handling, submission of specimen other than nasopharyngeal swab, presence of viral mutation(s) within the areas targeted by this assay, and inadequate number of viral copies (<250 copies / mL). A negative result must be combined with clinical observations, patient history, and epidemiological information.  Fact Sheet for Patients:   BoilerBrush.com.cy  Fact Sheet for Healthcare Providers: https://pope.com/  This test is not yet approved or  cleared by the [place] FDA and has been authorized for detection and/or diagnosis of SARS-CoV-2 by FDA under an Emergency Use Authorization (EUA).  This EUA will remain in effect (meaning this test can be used) for the duration of the COVID-19 declaration under Section 564(b)(1) of the Act, 21 U.S.C. section 360bbb-3(b)(1), unless the authorization is terminated or revoked sooner.  Performed at [facility], [address], [place], [place] [address]     Radiology Reports DG Chest 2 View  Result Date: 02/19/2020 CLINICAL DATA:  Weakness EXAM:  CHEST - 2 VIEW COMPARISON:  01/22/2020 FINDINGS: Cardiomegaly. No confluent opacities, effusions or edema. No acute bony abnormality. IMPRESSION: Cardiomegaly.  No active disease. Electronically Signed   By: Charlett Nose M.D.   On: 02/19/2020 21:26   CT Head Wo Contrast  Result Date: 02/23/2020 CLINICAL DATA:  Generalized weakness. EXAM: CT HEAD WITHOUT CONTRAST TECHNIQUE: Contiguous axial images were obtained from the base of the skull through the vertex without intravenous contrast. COMPARISON:  February 19, 2020 FINDINGS: Brain: No evidence of acute infarction, hemorrhage, hydrocephalus, extra-axial collection or mass lesion/mass effect. A stable, approximately 5.5 cm x 4.6 cm area of CSF attenuation is seen within the right occipital lobe. There is no evidence of associated mass effect or midline shift. Vascular: No hyperdense vessel or unexpected calcification. Skull: Normal. Negative for fracture or focal lesion. Sinuses/Orbits: No acute finding. Other: None. IMPRESSION: 1. No acute intracranial abnormality. 2. Stable right occipital lobe arachnoid cyst. Electronically Signed   By: Aram Candela M.D.   On: 02/23/2020 18:34   CT HEAD WO CONTRAST  Result Date: 02/19/2020 CLINICAL DATA:  Numbness in bilateral hands and feet. EXAM: CT HEAD WITHOUT CONTRAST TECHNIQUE: Contiguous axial images were obtained from the base of the skull through the vertex without intravenous contrast. COMPARISON:  None. FINDINGS: Brain: 5.8 cm cystic structure noted in the right posterior cerebral hemisphere compatible with arachnoid cyst. No hemorrhage, hydrocephalus, acute infarction or midline shift. Vascular: No hyperdense vessel or unexpected calcification. Skull: No acute calvarial abnormality. Sinuses/Orbits: Visualized paranasal sinuses and mastoids clear. Orbital soft tissues unremarkable. Other: None IMPRESSION: 5.8 cm posterior right arachnoid cyst. No acute intracranial abnormality. Electronically Signed   By: Charlett Nose M.D.   On: 02/19/2020 21:46   CT Cervical Spine Wo Contrast  Result Date: 02/23/2020 CLINICAL DATA:  Generalized weakness. EXAM: CT CERVICAL SPINE WITHOUT CONTRAST TECHNIQUE: Multidetector CT imaging of the cervical spine was performed without intravenous contrast. Multiplanar CT image reconstructions were also generated. COMPARISON:  None. FINDINGS: Alignment: Normal. Skull base and vertebrae: No acute fracture. No primary bone lesion or focal pathologic process. Soft tissues and spinal canal: No prevertebral fluid or swelling. No visible canal hematoma. Disc levels: Moderate severity multilevel endplate sclerosis is seen. Moderate severity intervertebral disc space narrowing is seen  at the level of C3-C4. Mild multilevel intervertebral disc space narrowing is seen throughout the remainder of the cervical spine. Normal bilateral multilevel facet joints are seen. Upper chest: Negative. Other: None. IMPRESSION: Multilevel degenerative disc disease of the cervical spine, most prominent at the level of C3-C4. Electronically Signed   By: Aram Candela M.D.   On: 02/23/2020 18:38   DG Chest Portable 1 View  Result Date: 02/23/2020 CLINICAL DATA:  Seizure today. No chest symptoms. Pt states wanting to hurt his selfseizure EXAM: PORTABLE CHEST 1 VIEW COMPARISON:  None. FINDINGS: Stable mild enlarged cardiac silhouette. Low lung volumes. No focal infiltrate. No pneumothorax. No pulmonary edema. No acute osseous abnormality. IMPRESSION: 1. Low lung volumes. 2. No acute findings. Electronically Signed   By: Genevive Bi M.D.   On: 02/23/2020 18:16    Lab Data:  CBC: Recent Labs  Lab 03/03/20 0429  WBC 7.7  HGB 11.9*  HCT 37.5*  MCV 63.7*  PLT 202   Basic Metabolic Panel: Recent Labs  Lab 02/27/20 1118 02/28/20 0347 03/01/20 0339 03/02/20 0335 03/03/20 0429  NA 131* 134* 132* 131* 131*  K 4.2 3.7 3.5 4.2 3.7  CL 93* 97* 95* 94* 94*  CO2 27 28 28 28 27   GLUCOSE 96 107* 126* 94  101*  BUN 10 17 9 9 8   CREATININE 0.54* 0.58* 0.53* 0.53* 0.52*  CALCIUM 8.9 9.0 9.3 9.3 9.3  MG  --   --   --   --  1.8  PHOS  --   --   --   --  5.1*   GFR: Estimated Creatinine Clearance: 115 mL/min (A) (by C-G formula based on SCr of 0.52 mg/dL (L)). Liver Function Tests: Recent Labs  Lab 03/03/20 0429  AST 37  ALT 36  ALKPHOS 229*  BILITOT 0.8  PROT 7.2  ALBUMIN 3.5   No results for input(s): LIPASE, AMYLASE in the last 168 hours. No results for input(s): AMMONIA in the last 168 hours. Coagulation Profile: No results for input(s): INR, PROTIME in the last 168 hours. Cardiac Enzymes: No results for input(s): CKTOTAL, CKMB, CKMBINDEX, TROPONINI in the last 168 hours. BNP (last 3 results) No results for input(s): PROBNP in the last 8760 hours. HbA1C: No results for input(s): HGBA1C in the last 72 hours. CBG: No results for input(s): GLUCAP in the last 168 hours. Lipid Profile: No results for input(s): CHOL, HDL, LDLCALC, TRIG, CHOLHDL, LDLDIRECT in the last 72 hours. Thyroid Function Tests: No results for input(s): TSH, T4TOTAL, FREET4, T3FREE, THYROIDAB in the last 72 hours. Anemia Panel: No results for input(s): VITAMINB12, FOLATE, FERRITIN, TIBC, IRON, RETICCTPCT in the last 72 hours. Urine analysis:    Component Value Date/Time   COLORURINE YELLOW 02/23/2020 1910   APPEARANCEUR CLEAR 02/23/2020 1910   LABSPEC 1.006 02/23/2020 1910   PHURINE 7.0 02/23/2020 1910   GLUCOSEU NEGATIVE 02/23/2020 1910   HGBUR NEGATIVE 02/23/2020 1910   BILIRUBINUR NEGATIVE 02/23/2020 1910   KETONESUR NEGATIVE 02/23/2020 1910   PROTEINUR NEGATIVE 02/23/2020 1910   NITRITE NEGATIVE 02/23/2020 1910   LEUKOCYTESUR NEGATIVE 02/23/2020 1910     Haruto Demaria M.D. [facility] 03/03/2020, 1:59 PM   Call night coverage person covering after 7pm

## 2020-03-03 NOTE — Progress Notes (Signed)
Physical Therapy Treatment Patient Details Name: Jim Dunn MRN: [id number] DOB: [birth year] Today's Date: 03/03/2020    History of Present Illness Pt is a 54 y/o male admitted secondary to hyponatremia and suicidal ideation. PMH includes CHF, COPD, seizures, and paranoid schizophrenia.     PT Comments    Pt progressing towards physical therapy goals. Pt negotiating around room well without difficulty and without overt LOB, however when in the hallway, noted more frequent episodes of unsteadiness and required the [facility] for support. Pt awaiting d/c to ALF and feel this recommendation remains appropriate. Will continue to follow.     Follow Up Recommendations  Home health PT     Equipment Recommendations  Rolling walker with 5" wheels    Recommendations for Other Services       Precautions / Restrictions Precautions Precautions: Fall Precaution Comments: suicide sitter Restrictions Weight Bearing Restrictions: No    Mobility  Bed Mobility Overal bed mobility: Modified Independent Bed Mobility: Supine to Sit           General bed mobility comments: Pt received essentially in supine laid back in the recliner. He was able to transition from supine to standing very quickly without assistance. Despite the decreased safety awareness, pt was able to complete without difficulty.   Transfers Overall transfer level: Needs assistance Equipment used: Straight cane Transfers: Sit to/from Stand Sit to Stand: Supervision         General transfer comment: Approaching modified independence. Very light supervision required initially for transition to standing. No unsteadiness or LOB noted.   Ambulation/Gait Ambulation/Gait assistance: Min guard;Min assist Gait Distance (Feet): 200 Feet Assistive device: Straight cane Gait Pattern/deviations: Step-through pattern;Decreased stride length;Shuffle Gait velocity: Decreased Gait velocity interpretation: <1.31 ft/sec, indicative of  household ambulator General Gait Details: Overall unsteady without the cane. With SPC, only occasional assist required. At one point pt stopped walking and with blank stare ahead. Therapist asked several times if he was ok and what was going on, and pt eventually answered that his "feet feel numb sometimes", and reports this is not new. Unsure about this episode however pt returned to prior quality of ambulation after.    Stairs             Wheelchair Mobility    Modified Rankin (Stroke Patients Only)       Balance Overall balance assessment: Needs assistance Sitting-balance support: No upper extremity supported;Feet supported Sitting balance-Leahy Scale: Good     Standing balance support: No upper extremity supported;During functional activity Standing balance-Leahy Scale: Fair                              Cognition Arousal/Alertness: Awake/alert Behavior During Therapy: WFL for tasks assessed/performed Overall Cognitive Status: History of cognitive impairments - at baseline                                 General Comments: paranoid schizophrenia at baseline      Exercises      General Comments        Pertinent Vitals/Pain Pain Assessment: No/denies pain    Home Living                      Prior Function            PT Goals (current goals can now be found in the care plan section)  Acute Rehab PT Goals Patient Stated Goal: to have somewhere to go  PT Goal Formulation: With patient Time For Goal Achievement: 03/10/20 Potential to Achieve Goals: Good    Frequency    Min 3X/week      PT Plan Current plan remains appropriate    Co-evaluation              AM-PAC PT "6 Clicks" Mobility   Outcome Measure  Help needed turning from your back to your side while in a flat bed without using bedrails?: None Help needed moving from lying on your back to sitting on the side of a flat bed without using bedrails?:  None Help needed moving to and from a bed to a chair (including a wheelchair)?: A Little Help needed standing up from a chair using your arms (e.g., wheelchair or bedside chair)?: A Little Help needed to walk in hospital room?: A Little Help needed climbing 3-5 steps with a railing? : A Little 6 Click Score: 20    End of Session Equipment Utilized During Treatment: Gait belt Activity Tolerance: Patient tolerated treatment well Patient left: in bed;with call bell/phone within reach;with nursing/sitter in room (sitting EOB with safety sitter present ) Nurse Communication: Mobility status PT Visit Diagnosis: Unsteadiness on feet (R26.81);Muscle weakness (generalized) (M62.81)     Time: 1497-0263 PT Time Calculation (min) (ACUTE ONLY): 11 min  Charges:  $Gait Training: 8-22 mins                     Conni Slipper, PT, DPT Acute Rehabilitation Services Pager: [phone] Office: [phone]    Marylynn Pearson 03/03/2020, 2:34 PM

## 2020-03-03 NOTE — Consult Note (Signed)
[facility] Face-to-Face Psychiatry Consult   Reason for Consult:  "Per staff patient has not been a threat to himself or others during his stay. Was just presented with an IVC by [employer] which was initiated by his mother." Referring Physician:  Dr. Joseph Art Patient Identification: Jim Dunn MRN:  [id number] Principal Diagnosis: Hyponatremia Diagnosis:  Principal Problem:   Hyponatremia Active Problems:   Chronic systolic CHF (congestive heart failure) (HCC)   Mixed hyperlipidemia   COPD (chronic obstructive pulmonary disease) (HCC)   Seizure disorder (HCC)   Paranoid schizophrenia (HCC)   Hypokalemia   Hypomagnesemia   Suicidal ideation   Noncompliance with medications   Subtherapeutic serum dilantin level   Seizure (HCC)   Total Time spent with patient: 30 minutes  Subjective: Patient states "I just want to go home."  HPI: Jim Dunn is a 54 y.o. male patient.  Patient assessed by nurse practitioner.  Patient alert and oriented, answers appropriately. Patient denies suicidal ideations.  Patient denies history of self-harm.  Patient denies homicidal ideations.  Patient denies symptoms of paranoia.  Patient does not appear to be responding to internal stimuli. Patient reports he cannot reside with his mother right now.  Patient states "she does not like it when I drink, I will not drink anymore." Patient reports he has recently resided with his mother in the [place] area but is from [facility] and would like to return to [place].  Patient denies access to weapons.  Patient reports he is currently disabled related to his mental illness.  Patient states "I would like to go back home and find me a room, I get paid today, on my card."  Patient reports he would like to use a bus for transportation back to [facility]. Patient reports rare use of alcohol.  Patient denies substance use.  Patient reports he is followed by outpatient psychiatry in [facility] at  [facility].  Patient reports compliance with medications including Invega IM every 30 days. Patient gives verbal consent to speak with his mother, Jim Dunn.  Phone number [phone].  Attempted to reach patient's mother, left HIPAA compliant voicemail.   Past Psychiatric History: Paranoid schizophrenia  Risk to Self:  Denies Risk to Others:  Denies Prior Inpatient Therapy:  None reported Prior Outpatient Therapy:  Everlean Cherry, [place]  Past Medical History:  Past Medical History:  Diagnosis Date  . CHF (congestive heart failure) (HCC)   . COPD (chronic obstructive pulmonary disease) (HCC)   . History of kidney stones   . Paranoid schizophrenia (HCC)   . Seizures (HCC)   . Tobacco abuse     Past Surgical History:  Procedure Laterality Date  . KIDNEY STONE SURGERY     Family History:  Family History  Problem Relation Age of Onset  . Heart disease Other    Family Psychiatric  History: None reported Social History:  Social History   Substance and Sexual Activity  Alcohol Use Not Currently     Social History   Substance and Sexual Activity  Drug Use Never    Social History   Socioeconomic History  . Marital status: Single    Spouse name: Not on file  . Number of children: Not on file  . Years of education: Not on file  . Highest education level: Not on file  Occupational History  . Not on file  Tobacco Use  . Smoking status: Current Every Day Smoker    Packs/day: 1.00  Types: Cigarettes  . Smokeless tobacco: Current User    Types: Chew  Vaping Use  . Vaping Use: Never used  Substance and Sexual Activity  . Alcohol use: Not Currently  . Drug use: Never  . Sexual activity: Not on file  Other Topics Concern  . Not on file  Social History Narrative  . Not on file   Social Determinants of Health   Financial Resource Strain:   . Difficulty of Paying Living Expenses:   Food Insecurity:   . Worried About Programme researcher, broadcasting/film/video in the  Last Year:   . Barista in the Last Year:   Transportation Needs:   . Freight forwarder (Medical):   [name] Lack of Transportation (Non-Medical):   Physical Activity:   . Days of Exercise per Week:   . Minutes of Exercise per Session:   Stress:   . Feeling of Stress :   Social Connections:   . Frequency of Communication with Friends and Family:   . Frequency of Social Gatherings with Friends and Family:   . Attends Religious Services:   . Active Member of Clubs or Organizations:   . Attends Banker Meetings:   [name] Marital Status:    Additional Social History:    Allergies:  No Known Allergies  Labs:  Results for orders placed or performed during the hospital encounter of 02/23/20 (from the past 48 hour(s))  Basic metabolic panel     Status: Abnormal   Collection Time: 03/02/20  3:35 AM  Result Value Ref Range   Sodium 131 (L) 135 - 145 mmol/L   Potassium 4.2 3.5 - 5.1 mmol/L   Chloride 94 (L) 98 - 111 mmol/L   CO2 28 22 - 32 mmol/L   Glucose, Bld 94 70 - 99 mg/dL    Comment: Glucose reference range applies only to samples taken after fasting for at least 8 hours.   BUN 9 6 - 20 mg/dL   Creatinine, Ser 9.62 (L) 0.61 - 1.24 mg/dL   Calcium 9.3 8.9 - 22.9 mg/dL   GFR calc non Af Amer >60 >60 mL/min   GFR calc Af Amer >60 >60 mL/min   Anion gap 9 5 - 15    Comment: Performed at [facility], [address], [place], [place] [address]  Comprehensive metabolic panel     Status: Abnormal   Collection Time: 03/03/20  4:29 AM  Result Value Ref Range   Sodium 131 (L) 135 - 145 mmol/L   Potassium 3.7 3.5 - 5.1 mmol/L   Chloride 94 (L) 98 - 111 mmol/L   CO2 27 22 - 32 mmol/L   Glucose, Bld 101 (H) 70 - 99 mg/dL    Comment: Glucose reference range applies only to samples taken after fasting for at least 8 hours.   BUN 8 6 - 20 mg/dL   Creatinine, Ser 1.19 (L) 0.61 - 1.24 mg/dL   Calcium 9.3 8.9 - 41.7 mg/dL   Total Protein 7.2 6.5 - 8.1 g/dL   Albumin  3.5 3.5 - 5.0 g/dL   AST 37 15 - 41 U/L   ALT 36 0 - 44 U/L   Alkaline Phosphatase 229 (H) 38 - 126 U/L   Total Bilirubin 0.8 0.3 - 1.2 mg/dL   GFR calc non Af Amer >60 >60 mL/min   GFR calc Af Amer >60 >60 mL/min   Anion gap 10 5 - 15    Comment: Performed at [employer]  [facility], [address], [place], [place] [address]  Magnesium     Status: None   Collection Time: 03/03/20  4:29 AM  Result Value Ref Range   Magnesium 1.8 1.7 - 2.4 mg/dL    Comment: Performed at [facility], [address], [place], [place] [address]  Phosphorus     Status: Abnormal   Collection Time: 03/03/20  4:29 AM  Result Value Ref Range   Phosphorus 5.1 (H) 2.5 - 4.6 mg/dL    Comment: Performed at [facility], [address] [place], [place] [address]  CBC     Status: Abnormal   Collection Time: 03/03/20  4:29 AM  Result Value Ref Range   WBC 7.7 4.0 - 10.5 K/uL   RBC 5.89 (H) 4.22 - 5.81 MIL/uL   Hemoglobin 11.9 (L) 13.0 - 17.0 g/dL   HCT 61.9 (L) 39 - 52 %   MCV 63.7 (L) 80.0 - 100.0 fL   MCH 20.2 (L) 26.0 - 34.0 pg   MCHC 31.7 30.0 - 36.0 g/dL   RDW 50.9 (H) 32.6 - 71.2 %   Platelets 202 150 - 400 K/uL   nRBC 0.0 0.0 - 0.2 %    Comment: Performed at [facility], [address] [place], [place] [address]    Current Facility-Administered Medications  Medication Dose Route Frequency Provider Last Rate Last Admin  . acetaminophen (TYLENOL) tablet 650 mg  650 mg Oral Q6H PRN Marinda Elk, MD   650 mg at 02/25/20 1828   Or  . acetaminophen (TYLENOL) suppository 650 mg  650 mg Rectal Q6H PRN Shalhoub, Deno Lunger, MD      . ALPRAZolam Prudy Feeler) tablet 0.5 mg  0.5 mg Oral BID PRN Marinda Elk, MD   0.5 mg at 03/02/20 0931  . atorvastatin (LIPITOR) tablet 40 mg  40 mg Oral Daily Shalhoub, Deno Lunger, MD   40 mg at 03/02/20 0929  . busPIRone (BUSPAR) tablet 10 mg  10 mg Oral TID Marinda Elk, MD   10 mg at 03/02/20 2119  . carvedilol (COREG) tablet 3.125 mg  3.125 mg Oral  BID WC Shalhoub, Deno Lunger, MD   3.125 mg at 03/03/20 0815  . digoxin (LANOXIN) tablet 0.125 mg  0.125 mg Oral Daily Shalhoub, Deno Lunger, MD   0.125 mg at 03/02/20 0929  . enoxaparin (LOVENOX) injection 40 mg  40 mg Subcutaneous Daily Shalhoub, Deno Lunger, MD   40 mg at 03/02/20 0930  . fluticasone furoate-vilanterol (BREO ELLIPTA) 200-25 MCG/INH 1 puff  1 puff Inhalation Daily Shalhoub, Deno Lunger, MD   1 puff at 03/03/20 [phone]  . furosemide (LASIX) injection 20 mg  20 mg Intravenous Once Marzetta Board, MD      . gabapentin (NEURONTIN) capsule 300 mg  300 mg Oral BID Marinda Elk, MD   300 mg at 03/02/20 2119  . ipratropium-albuterol (DUONEB) 0.5-2.5 (3) MG/3ML nebulizer solution 3 mL  3 mL Nebulization Q4H PRN Shalhoub, Deno Lunger, MD      . melatonin tablet 6 mg  6 mg Oral QHS PRN Marikay Alar, FNP   6 mg at 03/02/20 2133  . nicotine (NICODERM CQ - dosed in mg/24 hours) patch 21 mg  21 mg Transdermal Daily Shalhoub, Deno Lunger, MD   21 mg at 03/02/20 0931  . ondansetron (ZOFRAN) tablet 4 mg  4 mg Oral Q6H PRN Shalhoub, Deno Lunger, MD  Or  . ondansetron (ZOFRAN) injection 4 mg  4 mg Intravenous Q6H PRN Shalhoub, Deno Lunger, MD      . phenytoin (DILANTIN) ER capsule 100 mg  100 mg Oral TID Marinda Elk, MD   100 mg at 03/02/20 2119  . polyethylene glycol (MIRALAX / GLYCOLAX) packet 17 g  17 g Oral Daily PRN Shalhoub, Deno Lunger, MD      . QUEtiapine (SEROQUEL) tablet 200 mg  200 mg Oral Daily Burnadette Pop, MD   200 mg at 03/02/20 0929  . sacubitril-valsartan (ENTRESTO) 24-26 mg per tablet  1 tablet Oral BID Marinda Elk, MD   1 tablet at 03/02/20 2119  . torsemide (DEMADEX) tablet 20 mg  20 mg Oral Daily Burnadette Pop, MD   20 mg at 03/02/20 0929  . traMADol (ULTRAM) tablet 50 mg  50 mg Oral Q6H PRN Burnadette Pop, MD   50 mg at 02/27/20 0059    Musculoskeletal: Strength & Muscle Tone: within normal limits Gait & Station: normal Patient leans: N/A  Psychiatric  Specialty Exam: Physical Exam Vitals and nursing note reviewed.  Constitutional:      Appearance: He is well-developed.  HENT:     Head: Normocephalic.  Cardiovascular:     Rate and Rhythm: Normal rate.  Pulmonary:     Effort: Pulmonary effort is normal.  Neurological:     Mental Status: He is alert and oriented to person, place, and time.  Psychiatric:        Mood and Affect: Mood normal.        Behavior: Behavior normal.        Thought Content: Thought content normal.        Judgment: Judgment normal.     Review of Systems  Constitutional: Negative.   HENT: Negative.   Eyes: Negative.   Respiratory: Negative.   Cardiovascular: Negative.   Gastrointestinal: Negative.   Genitourinary: Negative.   Musculoskeletal: Negative.   Skin: Negative.   Neurological: Negative.   Psychiatric/Behavioral: Negative.     Blood pressure (!) 153/95, pulse 79, temperature 97.8 F (36.6 C), temperature source Oral, resp. rate 18, weight 85.7 kg, SpO2 100 %.Body mass index is 28.74 kg/m.  General Appearance: Casual and Fairly Groomed  Eye Contact:  Good  Speech:  Clear and Coherent and Normal Rate  Volume:  Normal  Mood:  Euthymic  Affect:  Appropriate and Congruent  Thought Process:  Coherent, Goal Directed and Descriptions of Associations: Intact  Orientation:  Full (Time, Place, and Person)  Thought Content:  WDL and Logical  Suicidal Thoughts:  No  Homicidal Thoughts:  No  Memory:  Immediate;   Fair Recent;   Fair Remote;   Fair  Judgement:  Fair  Insight:  Fair  Psychomotor Activity:  Normal  Concentration:  Concentration: Good and Attention Span: Good  Recall:  Good  Fund of Knowledge:  Good  Language:  Good  Akathisia:  No  Handed:  Right  AIMS (if indicated):     Assets:  Communication Skills Desire for Improvement Financial Resources/Insurance Intimacy Leisure Time Physical Health Resilience Social Support  ADL's:  Intact  Cognition:  WNL  Sleep:         Treatment Plan Summary: Patient discussed with Dr. Lucianne Muss.  Patient is a 54 year old male.  Patient has history of paranoid schizophrenia, stable on medications, followed outpatient.  Based on my assessment today, patient would benefit from follow-up with established outpatient psychiatry provider.   Recommendations: -Seroquel to 200  mg nightly -Xanax 0.5 mg p.o. twice daily as needed -BuSpar 10 mg p.o. 3 times daily -Neurontin 300 mg p.o. twice daily -Invega 234 milligrams IM every 30 days last administration 02/10/2020. -Recommend consider social work consult to address current housing needs and transportation needs. -Recommend follow-up with established outpatient psychiatry.    Disposition: No evidence of imminent risk to self or others at present.   Patient does not meet criteria for psychiatric inpatient admission. Supportive therapy provided about ongoing stressors.  Patrcia Dolly, FNP 03/03/2020 9:12 AM

## 2020-03-03 NOTE — Progress Notes (Signed)
Pt's BP was 90/61. Pt. stated "it gets like this when I am anxious." Administered PRN Xanax. Notified MD. No new orders. BP rechecked and was 108/61.

## 2020-03-03 NOTE — Plan of Care (Signed)

## 2020-03-03 NOTE — TOC CAGE-AID Note (Signed)
Transition of Care [facility]) - CAGE-AID Screening   Patient Details  Name: Jim Dunn MRN: [id number] Date of Birth: 08-06-1966  Transition of [facility]) CM/SW Contact:    Emeterio Reeve, [place] Phone Number: 03/03/2020, 2:34 PM   Clinical Narrative:  CSW met with pt at bedside. CSW introduced self and explained her role at the hospital. Pt reported occasional alcohol use of less than monthly. Pt denies substance use. Pt reports he has a prescription for xanax and takes it as prescribed. Pt did not need resources at this time.   CAGE-AID Screening:    Have You Ever Felt You Ought to Cut Down on Your Drinking or Drug Use?: No Have People Annoyed You By Critizing Your Drinking Or Drug Use?: No Have You Felt Bad Or Guilty About Your Drinking Or Drug Use?: No Have You Ever Had a Drink or Used Drugs First Thing In The Morning to STeady Your Nerves or to Get Rid of a Hangover?: No CAGE-AID Score: 0  Substance Abuse Education Offered: Yes    Blima Ledger, [place] Social Worker [phone]

## 2020-03-03 NOTE — Plan of Care (Signed)
  Problem: Nutrition: Goal: Adequate nutrition will be maintained Outcome: Progressing   Problem: Pain Managment: Goal: General experience of comfort will improve Outcome: Progressing   Problem: Safety: Goal: Ability to remain free from injury will improve Outcome: Progressing   

## 2020-03-04 LAB — COMPREHENSIVE METABOLIC PANEL
ALT: 34 U/L (ref 0–44)
AST: 39 U/L (ref 15–41)
Albumin: 3.2 g/dL — ABNORMAL LOW (ref 3.5–5.0)
Alkaline Phosphatase: 198 U/L — ABNORMAL HIGH (ref 38–126)
Anion gap: 8 (ref 5–15)
BUN: 13 mg/dL (ref 6–20)
CO2: 28 mmol/L (ref 22–32)
Calcium: 9 mg/dL (ref 8.9–10.3)
Chloride: 95 mmol/L — ABNORMAL LOW (ref 98–111)
Creatinine, Ser: 0.54 mg/dL — ABNORMAL LOW (ref 0.61–1.24)
GFR calc Af Amer: >60 mL/min (ref >60)
GFR calc non Af Amer: >60 mL/min (ref >60)
Glucose, Bld: 96 mg/dL (ref 70–99)
Potassium: 3.7 mmol/L (ref 3.5–5.1)
Sodium: 131 mmol/L — ABNORMAL LOW (ref 135–145)
Total Bilirubin: 0.7 mg/dL (ref 0.3–1.2)
Total Protein: 6.2 g/dL — ABNORMAL LOW (ref 6.5–8.1)

## 2020-03-04 LAB — CBC
HCT: 34.6 % — ABNORMAL LOW (ref 39.0–52.0)
Hemoglobin: 11 g/dL — ABNORMAL LOW (ref 13.0–17.0)
MCH: 20.3 pg — ABNORMAL LOW (ref 26.0–34.0)
MCHC: 31.8 g/dL (ref 30.0–36.0)
MCV: 63.8 fL — ABNORMAL LOW (ref 80.0–100.0)
Platelets: 188 10*3/uL (ref 150–400)
RBC: 5.42 MIL/uL (ref 4.22–5.81)
RDW: 26.2 % — ABNORMAL HIGH (ref 11.5–15.5)
WBC: 7.8 10*3/uL (ref 4.0–10.5)
nRBC: 0 % (ref 0.0–0.2)

## 2020-03-04 LAB — MAGNESIUM: Magnesium: 1.7 mg/dL (ref 1.7–2.4)

## 2020-03-04 LAB — PHOSPHORUS: Phosphorus: 5.2 mg/dL — ABNORMAL HIGH (ref 2.5–4.6)

## 2020-03-04 NOTE — Progress Notes (Signed)
[facility]                                                                              Patient Demographics  Jim Dunn, is a 54 y.o. male, DOB - May 30, 1966, DJM:[id number]  Admit date - 02/23/2020   Admitting Physician Marinda Elk, MD  Outpatient Primary MD for the patient is System, Pcp Not In  Outpatient specialists:   LOS - 10  days   Medical records reviewed and are as summarized below:    Chief Complaint  Patient presents with  . 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 He was also found to be hyponatremic on presentation.He had left AGAINST MEDICAL ADVICE on 6/19. On presentation he was alsohypokalemic/hypomagnesemic. He also had seizure episodes while in the waiting room. Patient reported not taking Dilantin at home.Sodium level has improved now and currently he is alert and oriented, seizure-free Due to unsafe home environment, SW assisting to place an ALF for group home. Medically stable for discharge. Social worker assisting on placement  Assessment & Plan    Principal Problem:   Hyponatremia -Suspected to be from hypotonic hypovolemic hyponatremia from dehydration -Patient was on torsemide, metolazone at home, also drinking alcohol, orthostatic and dizzy upon standing. -Urine osmolality 261, serum osmolality 252 on 6/21 -Sodium stable 131 on phenytoin, Seroquel -No acute issues  Active Problems:  Seizure disorder -Had an episode of seizures while in the waiting room, suspected to be noncompliant with Dilantin -Received loading dose of fosphenytoin, Dilantin has been restarted -Alert and oriented, no repeat seizures   Suicidal ideation,  with underlying history of schizophrenia -Psychiatry was consulted, denies any suicidal ideation now, psychiatry does not recommend inpatient psych admission -Currently on BuSpar, Neurontin, Invega, Xanax as needed, recommended outpatient psych follow-up -Seroquel decreased to 200 mg daily -Psychiatry reconsulted as patient was involuntary committed by his mother on 6/29 and served by [facility] -Patient however has not been threatening or danger to self or others while inpatient.  Seen by psychiatry on 6/30, no new changes.  Prolonged QTC - Improved to 450 as per last EKG. Dose of Seroquel decreased to 200 mg HS  Chronic systolic CHF -Appear to be dry on presentation, diuretic therapy was held and patient received IV fluids -2D echo May/2021 showed EF 25 to 30%, global hypokinesis, right ventricular volume overload. -Metolazone discontinued, continue torsemide 20 mg daily, currently euvolemic  COPD:  -Not on home oxygen. Currently stable.  -On inhalers at home.   Unsafe home environment -TOC team working on placement in ALF or group home  Code Status: Full CODE STATUS DVT Prophylaxis:  Lovenox Family Communication: Discussed all imaging results, lab results, explained to the patient   Disposition Plan:     Status is: Inpatient  Remains inpatient appropriate because:Inpatient level of care appropriate due to severity of illness  Dispo: The patient is from: Home              Anticipated d/c is to: ALF              Anticipated d/c date is: 1 day              Patient currently is medically stable to d/c.  Discussed with social work, currently assisting with placement      Time Spent in minutes 25 minutes  Procedures:  None  Consultants:   Psychiatry  Antimicrobials:   Anti-infectives (From admission, onward)   None         Medications  Scheduled Meds: . atorvastatin  40 mg Oral Daily  . busPIRone  10 mg Oral TID  . carvedilol  3.125 mg Oral BID WC  .  digoxin  0.125 mg Oral Daily  . enoxaparin (LOVENOX) injection  40 mg Subcutaneous Daily  . fluticasone furoate-vilanterol  1 puff Inhalation Daily  . furosemide  20 mg Intravenous Once  . gabapentin  300 mg Oral BID  . nicotine  21 mg Transdermal Daily  . phenytoin  100 mg Oral TID  . QUEtiapine  200 mg Oral Daily  . sacubitril-valsartan  1 tablet Oral BID  . torsemide  20 mg Oral Daily   Continuous Infusions: PRN Meds:.acetaminophen **OR** acetaminophen, ALPRAZolam, ipratropium-albuterol, melatonin, ondansetron **OR** ondansetron (ZOFRAN) IV, polyethylene glycol, traMADol      Subjective:   Kevin Winquist was seen and examined today.  Sitting up in the chair, sitter at the bedside.  Patient is alert and oriented, calm and cooperative.  No acute issues overnight.  Tolerating diet.   Patient denies dizziness, chest pain, shortness of breath, abdominal pain, N/V/D/C, new weakness, numbess, tingling.   Objective:   Vitals:   03/04/20 0740 03/04/20 0802 03/04/20 1040 03/04/20 1145  BP: 133/83  (!) 108/57 102/61  Pulse: 86  79 83  Resp: 18   18  Temp: 98.1 F (36.7 C)   98.4 F (36.9 C)  TempSrc: Oral   Oral  SpO2: 100% 100% 99% 99%  Weight:        Intake/Output Summary (Last 24 hours) at 03/04/2020 1416 Last data filed at 03/04/2020 1145 Gross per 24 hour  Intake 360 ml  Output 1200 ml  Net -840 ml     Wt Readings from Last 3 Encounters:  03/03/20 85.7 kg  02/20/20 82.4 kg  01/27/20 90.9 kg   Physical Exam  General: Alert and oriented x 3, NAD  Cardiovascular: S1 S2 clear, RRR. No pedal edema b/l  Respiratory: CTAB, no wheezing, rales or rhonchi  Gastrointestinal: Soft, nontender, nondistended, NBS  Ext: no pedal edema bilaterally  Neuro: no new deficits  Musculoskeletal: No cyanosis, clubbing  Skin: No rashes  Psych: Normal affect and demeanor, alert and oriented x3    Data Reviewed:  I have personally reviewed following labs and imaging  studies  Micro Results Recent Results (from the past 240 hour(s))  SARS Coronavirus 2 by RT PCR (hospital order, performed in [facility] hospital lab) Nasopharyngeal Nasopharyngeal Swab     Status: None   Collection Time: 02/23/20  7:10 PM   Specimen: Nasopharyngeal Swab  Result Value Ref Range Status   SARS Coronavirus 2 NEGATIVE NEGATIVE Final    Comment: (NOTE) SARS-CoV-2 target nucleic acids are NOT DETECTED.  The SARS-CoV-2 RNA is generally detectable in upper and lower respiratory specimens during the acute phase of infection. The lowest concentration of SARS-CoV-2  viral copies this assay can detect is 250 copies / mL. A negative result does not preclude SARS-CoV-2 infection and should not be used as the sole basis for treatment or other patient management decisions.  A negative result may occur with improper specimen collection / handling, submission of specimen other than nasopharyngeal swab, presence of viral mutation(s) within the areas targeted by this assay, and inadequate number of viral copies (<250 copies / mL). A negative result must be combined with clinical observations, patient history, and epidemiological information.  Fact Sheet for Patients:   BoilerBrush.com.cy  Fact Sheet for Healthcare Providers: https://pope.com/  This test is not yet approved or  cleared by the [place] FDA and has been authorized for detection and/or diagnosis of SARS-CoV-2 by FDA under an Emergency Use Authorization (EUA).  This EUA will remain in effect (meaning this test can be used) for the duration of the COVID-19 declaration under Section 564(b)(1) of the Act, 21 U.S.C. section 360bbb-3(b)(1), unless the authorization is terminated or revoked sooner.  Performed at [facility], [address], [place], [place] [address]     Radiology Reports DG Chest 2 View  Result Date: 02/19/2020 CLINICAL DATA:  Weakness EXAM:  CHEST - 2 VIEW COMPARISON:  01/22/2020 FINDINGS: Cardiomegaly. No confluent opacities, effusions or edema. No acute bony abnormality. IMPRESSION: Cardiomegaly.  No active disease. Electronically Signed   By: Charlett Nose M.D.   On: 02/19/2020 21:26   CT Head Wo Contrast  Result Date: 02/23/2020 CLINICAL DATA:  Generalized weakness. EXAM: CT HEAD WITHOUT CONTRAST TECHNIQUE: Contiguous axial images were obtained from the base of the skull through the vertex without intravenous contrast. COMPARISON:  February 19, 2020 FINDINGS: Brain: No evidence of acute infarction, hemorrhage, hydrocephalus, extra-axial collection or mass lesion/mass effect. A stable, approximately 5.5 cm x 4.6 cm area of CSF attenuation is seen within the right occipital lobe. There is no evidence of associated mass effect or midline shift. Vascular: No hyperdense vessel or unexpected calcification. Skull: Normal. Negative for fracture or focal lesion. Sinuses/Orbits: No acute finding. Other: None. IMPRESSION: 1. No acute intracranial abnormality. 2. Stable right occipital lobe arachnoid cyst. Electronically Signed   By: Aram Candela M.D.   On: 02/23/2020 18:34   CT HEAD WO CONTRAST  Result Date: 02/19/2020 CLINICAL DATA:  Numbness in bilateral hands and feet. EXAM: CT HEAD WITHOUT CONTRAST TECHNIQUE: Contiguous axial images were obtained from the base of the skull through the vertex without intravenous contrast. COMPARISON:  None. FINDINGS: Brain: 5.8 cm cystic structure noted in the right posterior cerebral hemisphere compatible with arachnoid cyst. No hemorrhage, hydrocephalus, acute infarction or midline shift. Vascular: No hyperdense vessel or unexpected calcification. Skull: No acute calvarial abnormality. Sinuses/Orbits: Visualized paranasal sinuses and mastoids clear. Orbital soft tissues unremarkable. Other: None IMPRESSION: 5.8 cm posterior right arachnoid cyst. No acute intracranial abnormality. Electronically Signed   By: Charlett Nose M.D.   On: 02/19/2020 21:46   CT Cervical Spine Wo Contrast  Result Date: 02/23/2020 CLINICAL DATA:  Generalized weakness. EXAM: CT CERVICAL SPINE WITHOUT CONTRAST TECHNIQUE: Multidetector CT imaging of the cervical spine was performed without intravenous contrast. Multiplanar CT image reconstructions were also generated. COMPARISON:  None. FINDINGS: Alignment: Normal. Skull base and vertebrae: No acute fracture. No primary bone lesion or focal pathologic process. Soft tissues and spinal canal: No prevertebral fluid or swelling. No visible canal hematoma. Disc levels: Moderate severity multilevel endplate sclerosis is seen. Moderate severity intervertebral disc space narrowing is seen at  the level of C3-C4. Mild multilevel intervertebral disc space narrowing is seen throughout the remainder of the cervical spine. Normal bilateral multilevel facet joints are seen. Upper chest: Negative. Other: None. IMPRESSION: Multilevel degenerative disc disease of the cervical spine, most prominent at the level of C3-C4. Electronically Signed   By: Aram Candela M.D.   On: 02/23/2020 18:38   DG Chest Portable 1 View  Result Date: 02/23/2020 CLINICAL DATA:  Seizure today. No chest symptoms. Pt states wanting to hurt his selfseizure EXAM: PORTABLE CHEST 1 VIEW COMPARISON:  None. FINDINGS: Stable mild enlarged cardiac silhouette. Low lung volumes. No focal infiltrate. No pneumothorax. No pulmonary edema. No acute osseous abnormality. IMPRESSION: 1. Low lung volumes. 2. No acute findings. Electronically Signed   By: Genevive Bi M.D.   On: 02/23/2020 18:16    Lab Data:  CBC: Recent Labs  Lab 03/03/20 0429 03/04/20 0309  WBC 7.7 7.8  HGB 11.9* 11.0*  HCT 37.5* 34.6*  MCV 63.7* 63.8*  PLT 202 188   Basic Metabolic Panel: Recent Labs  Lab 02/28/20 0347 03/01/20 0339 03/02/20 0335 03/03/20 0429 03/04/20 0309  NA 134* 132* 131* 131* 131*  K 3.7 3.5 4.2 3.7 3.7  CL 97* 95* 94* 94* 95*  CO2 28  28 28 27 28   GLUCOSE 107* 126* 94 101* 96  BUN 17 9 9 8 13   CREATININE 0.58* 0.53* 0.53* 0.52* 0.54*  CALCIUM 9.0 9.3 9.3 9.3 9.0  MG  --   --   --  1.8 1.7  PHOS  --   --   --  5.1* 5.2*   GFR: Estimated Creatinine Clearance: 115 mL/min (A) (by C-G formula based on SCr of 0.54 mg/dL (L)). Liver Function Tests: Recent Labs  Lab 03/03/20 0429 03/04/20 0309  AST 37 39  ALT 36 34  ALKPHOS 229* 198*  BILITOT 0.8 0.7  PROT 7.2 6.2*  ALBUMIN 3.5 3.2*   No results for input(s): LIPASE, AMYLASE in the last 168 hours. No results for input(s): AMMONIA in the last 168 hours. Coagulation Profile: No results for input(s): INR, PROTIME in the last 168 hours. Cardiac Enzymes: No results for input(s): CKTOTAL, CKMB, CKMBINDEX, TROPONINI in the last 168 hours. BNP (last 3 results) No results for input(s): PROBNP in the last 8760 hours. HbA1C: No results for input(s): HGBA1C in the last 72 hours. CBG: No results for input(s): GLUCAP in the last 168 hours. Lipid Profile: No results for input(s): CHOL, HDL, LDLCALC, TRIG, CHOLHDL, LDLDIRECT in the last 72 hours. Thyroid Function Tests: No results for input(s): TSH, T4TOTAL, FREET4, T3FREE, THYROIDAB in the last 72 hours. Anemia Panel: No results for input(s): VITAMINB12, FOLATE, FERRITIN, TIBC, IRON, RETICCTPCT in the last 72 hours. Urine analysis:    Component Value Date/Time   COLORURINE YELLOW 02/23/2020 1910   APPEARANCEUR CLEAR 02/23/2020 1910   LABSPEC 1.006 02/23/2020 1910   PHURINE 7.0 02/23/2020 1910   GLUCOSEU NEGATIVE 02/23/2020 1910   HGBUR NEGATIVE 02/23/2020 1910   BILIRUBINUR NEGATIVE 02/23/2020 1910   KETONESUR NEGATIVE 02/23/2020 1910   PROTEINUR NEGATIVE 02/23/2020 1910   NITRITE NEGATIVE 02/23/2020 1910   LEUKOCYTESUR NEGATIVE 02/23/2020 1910     Leanna Hamid M.D. [facility] 03/04/2020, 2:16 PM   Call night coverage person covering after 7pm

## 2020-03-04 NOTE — Progress Notes (Signed)
OT Cancellation Note  Patient Details Name: Jim Dunn MRN: [id number] DOB: 1965/09/23   Cancelled Treatment:    Reason Eval/Treat Not Completed: Fatigue/lethargy limiting ability to participate;Other (comment) Pt asleep upon OTA arrival, unable to arouse pt. Sitter reports pt had just fallen asleep as pt did not sleep last night. Will check back as time allows for OT session.  Jim Amel., COTA/L Acute Rehabilitation Services [phone]   Jim Dunn 03/04/2020, 10:33 AM

## 2020-03-05 MED ORDER — GABAPENTIN 300 MG PO CAPS
300.0000 mg | ORAL_CAPSULE | Freq: Three times a day (TID) | ORAL | Status: DC
  Administered 2020-03-05 – 2020-03-17 (×36): 300 mg via ORAL
  Filled 2020-03-05 (×36): qty 1

## 2020-03-05 NOTE — Progress Notes (Signed)
Occupational Therapy Treatment Patient Details Name: Jim Dunn MRN: [id number] DOB: [birth year] Today's Date: 03/05/2020    History of present illness Pt is a 54 y/o male admitted secondary to hyponatremia and suicidal ideation. PMH includes CHF, COPD, seizures, and paranoid schizophrenia.    OT comments  Pt making steady progress towards OT goals this session. Pt continues to c/o numbness and tingling in Bil hands and feet. Pt able to complete household distance functional mobility with SPC and MIN A for balance. Pts balance greatly improves with use of cane and when pt wears shoes. Visual demo of cane mgmt during ambulation with pt able to return demo. Issued pt level 1 theraputty and HEP to assist with numbness and tingling in BUEs to facilitate  increased [facility] for higher level BADLs. Pt very appreciative of HEP and reports "this makes them feel better." Agree with DC plan below, will follow acutely per POC.   Follow Up Recommendations  Other (comment);Supervision - Intermittent (S wtih all medication and financial management)    Equipment Recommendations       Recommendations for Other Services      Precautions / Restrictions Precautions Precautions: Fall Precaution Comments: suicide sitter Restrictions Weight Bearing Restrictions: No       Mobility Bed Mobility               General bed mobility comments: pt OOB in chair upon arrival  Transfers Overall transfer level: Needs assistance Equipment used: Straight cane Transfers: Sit to/from Stand Sit to Stand: Supervision         General transfer comment: gross supervision for functional mobility; visual demo of managing cane during ambulation    Balance Overall balance assessment: Needs assistance Sitting-balance support: No upper extremity supported;Feet supported Sitting balance-Leahy Scale: Good Sitting balance - Comments: able to reach to BLEs to don shoes with no LOB   Standing balance support: No upper  extremity supported;During functional activity Standing balance-Leahy Scale: Fair                             ADL either performed or assessed with clinical judgement   ADL Overall ADL's : Needs assistance/impaired                     Lower Body Dressing: Supervision/safety;Sitting/lateral leans Lower Body Dressing Details (indicate cue type and reason): to don shoes from chair Toilet Transfer: Minimal assistance;Ambulation;Supervision/safety Toilet Transfer Details (indicate cue type and reason): simulated via functional mobility with SPC; MIN A for balance         Functional mobility during ADLs: Minimal assistance;Cane General ADL Comments: c/o numbess in hands; improved balance when pt ambulates in shoes with SPC     Vision Baseline Vision/History: Wears glasses (pt reports wears glassess for reading but doesnt have them)     Perception     Praxis      Cognition Arousal/Alertness: Awake/alert Behavior During Therapy: WFL for tasks assessed/performed Overall Cognitive Status: History of cognitive impairments - at baseline                                 General Comments: paranoid schizophrenia at baseline; [facility] for simple tasks during session. benefited from teach back method when instructing pt on HEP        Exercises Hand Exercises Digit Composite Flexion: AROM;Both;5 reps;Seated;Other (comment) (theraputty) Composite Extension: AROM;Both;5 reps;Seated;Other (comment) (  theraputty) Digit Composite Abduction: AROM;Both;5 reps;Seated;Other (comment) (level 1 theraputty) Digit Composite Adduction: AROM;Both;5 reps;Seated;Other (comment) (level 1 theraputty) Thumb Abduction: AROM;Both;5 reps;Seated;Other (comment) (level 1 theraputty) Thumb Adduction: AROM;5 reps;Seated;Other (comment);Both (level 1 theraputty) Other Exercises Other Exercises: issued pt level 1 theraputty with visual aids to assist with numbness and tingling in BUEs    Shoulder Instructions       General Comments issued pt level 1 theraputty and HEP    Pertinent Vitals/ Pain       Pain Assessment: Faces Faces Pain Scale: No hurt  Home Living                                          Prior Functioning/Environment              Frequency  Min 2X/week        Progress Toward Goals  OT Goals(current goals can now be found in the care plan section)  Progress towards OT goals: Progressing toward goals  Acute Rehab OT Goals Patient Stated Goal: to have somewhere to go  OT Goal Formulation: With patient Time For Goal Achievement: 03/11/20 Potential to Achieve Goals: Good  Plan Discharge plan remains appropriate;Frequency remains appropriate    Co-evaluation                 AM-PAC OT "6 Clicks" Daily Activity     Outcome Measure   Help from another person eating meals?: None Help from another person taking care of personal grooming?: A Little Help from another person toileting, which includes using toliet, bedpan, or urinal?: A Little Help from another person bathing (including washing, rinsing, drying)?: A Little Help from another person to put on and taking off regular upper body clothing?: A Little Help from another person to put on and taking off regular lower body clothing?: A Little 6 Click Score: 19    End of Session Equipment Utilized During Treatment: Gait belt;Other (comment) (SPC)  OT Visit Diagnosis: Unsteadiness on feet (R26.81)   Activity Tolerance Patient tolerated treatment well   Patient Left in chair;with call bell/phone within reach;with nursing/sitter in room   Nurse Communication Mobility status        Time: 1025-8527 OT Time Calculation (min): 23 min  Charges: OT General Charges $OT Visit: 1 Visit OT Treatments $Self Care/Home Management : 8-22 mins $Therapeutic Exercise: 8-22 mins  Audery Amel., COTA/L Acute Rehabilitation  Services [phone]    Angelina Pih 03/05/2020, 9:41 AM

## 2020-03-05 NOTE — Progress Notes (Signed)
[facility]                                                                              Patient Demographics  Jim Dunn, is a 54 y.o. male, DOB - 07-Feb-1966, YPP:[id number]  Admit date - 02/23/2020   Admitting Physician Marinda Elk, MD  Outpatient Primary MD for the patient is System, Pcp Not In  Outpatient specialists:   LOS - 11  days   Medical records reviewed and are as summarized below:    Chief Complaint  Patient presents with  . 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 He was also found to be hyponatremic on presentation.He had left AGAINST MEDICAL ADVICE on 6/19. On presentation he was alsohypokalemic/hypomagnesemic. He also had seizure episodes while in the waiting room. Patient reported not taking Dilantin at home.Sodium level has improved now and currently he is alert and oriented, seizure-free Due to unsafe home environment, SW assisting to place an ALF for group home. Medically stable for discharge. Social worker assisting on placement  Assessment & Plan    Principal Problem:   Hyponatremia -Suspected to be from hypotonic hypovolemic hyponatremia from dehydration -Patient was on torsemide, metolazone at home, also drinking alcohol, orthostatic and dizzy upon standing. -Urine osmolality 261, serum osmolality 252 on 6/21 -Sodium stable 131 on phenytoin, Seroquel -No acute issues  Active Problems:  Seizure disorder -Had an episode of seizures while in the waiting room, suspected to be noncompliant with Dilantin -Received loading dose of fosphenytoin, Dilantin has been restarted -No repeat seizures, alert and oriented x3   Suicidal  ideation, with underlying history of schizophrenia -Psychiatry was consulted, denies any suicidal ideation now, psychiatry does not recommend inpatient psych admission -Currently on BuSpar, Neurontin, Invega, Xanax as needed, recommended outpatient psych follow-up -Seroquel decreased to 200 mg daily -Psychiatry reconsulted as patient was involuntary committed by his mother on 6/29 and served by [facility] -Patient however has not been threatening or danger to self or others while inpatient.  Seen by psychiatry on 6/30, no new changes.  Prolonged QTC - Improved to 450 as per last EKG. Dose of Seroquel decreased to 200 mg HS  Chronic systolic CHF -Appear to be dry on presentation, diuretic therapy was held and patient received IV fluids -2D echo May/2021 showed EF 25 to 30%, global hypokinesis, right ventricular volume overload. -Metolazone discontinued, continue torsemide 20 mg daily, currently euvolemic  COPD:  -Not on home oxygen. Currently stable.  -On inhalers at home.  Bilateral neuropathy -Increase Neurontin to 300 3 times daily, continue tramadol as needed for pain  Unsafe home environment -TOC team working on placement in ALF or group home  Code Status: Full CODE STATUS DVT Prophylaxis:  Lovenox Family Communication: Discussed all imaging results, lab results, explained to the patient   Disposition Plan:     Status is: Inpatient  Remains inpatient appropriate because:Inpatient level of care appropriate due to severity of illness   Dispo: The patient is from: Home              Anticipated d/c is to: ALF              Anticipated d/c date is: 1 day              Patient currently is medically stable to d/c.  Discussed with social work, currently assisting with placement      Time Spent in minutes 25 minutes  Procedures:  None  Consultants:   Psychiatry  Antimicrobials:   Anti-infectives (From admission, onward)   None         Medications  Scheduled  Meds: . atorvastatin  40 mg Oral Daily  . busPIRone  10 mg Oral TID  . carvedilol  3.125 mg Oral BID WC  . digoxin  0.125 mg Oral Daily  . enoxaparin (LOVENOX) injection  40 mg Subcutaneous Daily  . fluticasone furoate-vilanterol  1 puff Inhalation Daily  . furosemide  20 mg Intravenous Once  . gabapentin  300 mg Oral BID  . nicotine  21 mg Transdermal Daily  . phenytoin  100 mg Oral TID  . QUEtiapine  200 mg Oral Daily  . sacubitril-valsartan  1 tablet Oral BID  . torsemide  20 mg Oral Daily   Continuous Infusions: PRN Meds:.acetaminophen **OR** acetaminophen, ALPRAZolam, ipratropium-albuterol, melatonin, ondansetron **OR** ondansetron (ZOFRAN) IV, polyethylene glycol, traMADol      Subjective:   Jim Dunn was seen and examined today.  No acute issues, sitter at the bedside.  Alert and oriented,.  Tolerating diet.  States has bilateral feet pain due to neuropathy and Neurontin not working. Patient denies dizziness, chest pain, shortness of breath, abdominal pain, N/V/D/C, new weakness,   Objective:   Vitals:   03/05/20 0402 03/05/20 0755 03/05/20 0757 03/05/20 1157  BP: (!) 152/86 116/82  119/79  Pulse: 95 (!) 103  96  Resp: 18 18  18   Temp: (!) 97.4 F (36.3 C) 97.7 F (36.5 C)  98.2 F (36.8 C)  TempSrc: Axillary Axillary  Oral  SpO2: 97% 99% 98% 100%  Weight:        Intake/Output Summary (Last 24 hours) at 03/05/2020 1226 Last data filed at 03/05/2020 1158 Gross per 24 hour  Intake 1300 ml  Output 2800 ml  Net -1500 ml     Wt Readings from Last 3 Encounters:  03/03/20 85.7 kg  02/20/20 82.4 kg  01/27/20 90.9 kg    Physical Exam  General: Alert and oriented x 3, NAD  Cardiovascular: S1 S2 clear, RRR. No pedal edema b/l  Respiratory: CTAB, no wheezing, rales or rhonchi  Gastrointestinal: Soft, nontender, nondistended, NBS  Ext: no pedal edema bilaterally  Neuro: no new deficits  Musculoskeletal: No cyanosis, clubbing  Skin: No rashes  Psych:  Normal affect and demeanor, alert and oriented x3     Data Reviewed:  I have personally reviewed following labs and imaging studies  Micro Results No results found for this or any previous visit (from the past 240 hour(s)).  Radiology Reports DG Chest 2 View  Result Date: 02/19/2020 CLINICAL DATA:  Weakness EXAM: CHEST - 2 VIEW COMPARISON:  01/22/2020 FINDINGS: Cardiomegaly. No confluent opacities, effusions or edema. No acute bony abnormality. IMPRESSION: Cardiomegaly.  No active disease. Electronically Signed   By: 01/24/2020 M.D.   On: 02/19/2020 21:26   CT Head Wo Contrast  Result Date: 02/23/2020 CLINICAL DATA:  Generalized weakness. EXAM: CT HEAD WITHOUT CONTRAST TECHNIQUE: Contiguous axial images were obtained from the base of the skull through the vertex without intravenous contrast. COMPARISON:  February 19, 2020 FINDINGS: Brain: No evidence of acute infarction, hemorrhage, hydrocephalus, extra-axial collection or mass lesion/mass effect. A stable, approximately 5.5 cm x 4.6 cm area of CSF attenuation is seen within the right occipital lobe. There is no evidence of associated mass effect or midline shift. Vascular: No hyperdense vessel or unexpected calcification. Skull: Normal. Negative for fracture or focal lesion. Sinuses/Orbits: No acute finding. Other: None. IMPRESSION: 1. No acute intracranial abnormality. 2. Stable right occipital lobe arachnoid cyst. Electronically Signed   By: Aram Candela M.D.   On: 02/23/2020 18:34   CT HEAD WO CONTRAST  Result Date: 02/19/2020 CLINICAL DATA:  Numbness in bilateral hands and feet. EXAM: CT HEAD WITHOUT CONTRAST TECHNIQUE: Contiguous axial images were obtained from the base of the skull through the vertex without intravenous contrast. COMPARISON:  None. FINDINGS: Brain: 5.8 cm cystic structure noted in the right posterior cerebral hemisphere compatible with arachnoid cyst. No hemorrhage, hydrocephalus, acute infarction or midline shift.  Vascular: No hyperdense vessel or unexpected calcification. Skull: No acute calvarial abnormality. Sinuses/Orbits: Visualized paranasal sinuses and mastoids clear. Orbital soft tissues unremarkable. Other: None IMPRESSION: 5.8 cm posterior right arachnoid cyst. No acute intracranial abnormality. Electronically Signed   By: Charlett Nose M.D.   On: 02/19/2020 21:46   CT Cervical Spine Wo Contrast  Result Date: 02/23/2020 CLINICAL DATA:  Generalized weakness. EXAM: CT CERVICAL SPINE WITHOUT CONTRAST TECHNIQUE: Multidetector CT imaging of the cervical spine was performed without intravenous contrast. Multiplanar CT image reconstructions were also generated. COMPARISON:  None. FINDINGS: Alignment: Normal. Skull base and vertebrae: No acute fracture. No primary bone lesion or focal pathologic process. Soft tissues and spinal canal: No prevertebral fluid or swelling. No visible canal hematoma. Disc levels: Moderate severity multilevel endplate sclerosis is seen. Moderate severity intervertebral disc space narrowing is seen at the level of C3-C4. Mild multilevel intervertebral disc space narrowing is seen throughout the remainder of the cervical spine. Normal bilateral multilevel facet joints are seen. Upper chest: Negative. Other: None. IMPRESSION: Multilevel degenerative disc disease of the cervical spine, most prominent at the level of C3-C4. Electronically Signed   By: Aram Candela M.D.   On: 02/23/2020 18:38   DG Chest Portable 1 View  Result Date: 02/23/2020 CLINICAL DATA:  Seizure today. No chest symptoms. Pt states wanting to hurt his selfseizure EXAM: PORTABLE CHEST 1 VIEW COMPARISON:  None. FINDINGS: Stable mild enlarged cardiac silhouette. Low lung volumes. No focal infiltrate. No pneumothorax. No pulmonary edema. No acute osseous abnormality. IMPRESSION: 1. Low lung volumes. 2. No acute findings. Electronically Signed   By: Genevive Bi M.D.   On: 02/23/2020 18:16    Lab Data:  CBC: Recent  Labs  Lab 03/03/20 0429 03/04/20 0309  WBC 7.7 7.8  HGB 11.9* 11.0*  HCT 37.5* 34.6*  MCV 63.7* 63.8*  PLT 202 188   Basic Metabolic Panel: Recent Labs  Lab 02/28/20 0347 03/01/20 0339 03/02/20 0335 03/03/20 0429 03/04/20 0309  NA 134* 132* 131* 131* 131*  K 3.7 3.5 4.2 3.7 3.7  CL 97* 95* 94* 94* 95*  CO2 28 28 28 27 28   GLUCOSE 107* 126* 94 101* 96  BUN 17 9 9 8 13   CREATININE 0.58* 0.53* 0.53* 0.52* 0.54*  CALCIUM 9.0 9.3 9.3 9.3 9.0  MG  --   --   --  1.8 1.7  PHOS  --   --   --  5.1* 5.2*   GFR: Estimated Creatinine Clearance: 115 mL/min (A) (by C-G formula based on SCr of 0.54 mg/dL (L)). Liver Function Tests: Recent Labs  Lab 03/03/20 0429 03/04/20 0309  AST 37 39  ALT 36 34  ALKPHOS 229* 198*  BILITOT 0.8 0.7  PROT 7.2 6.2*  ALBUMIN 3.5 3.2*   No results for input(s): LIPASE, AMYLASE in the last 168 hours. No results for input(s): AMMONIA in the last 168 hours. Coagulation Profile: No results for input(s): INR, PROTIME in the last 168 hours. Cardiac Enzymes: No results for input(s): CKTOTAL, CKMB, CKMBINDEX, TROPONINI in the last 168 hours. BNP (last 3 results) No results for input(s): PROBNP in the last 8760 hours. HbA1C: No results for input(s): HGBA1C in the last 72 hours. CBG: No results for input(s): GLUCAP in the last 168 hours. Lipid Profile: No results for input(s): CHOL, HDL, LDLCALC, TRIG, CHOLHDL, LDLDIRECT in the last 72 hours. Thyroid Function Tests: No results for input(s): TSH, T4TOTAL, FREET4, T3FREE, THYROIDAB in the last 72 hours. Anemia Panel: No results for input(s): VITAMINB12, FOLATE, FERRITIN, TIBC, IRON, RETICCTPCT in the last 72 hours. Urine analysis:    Component Value Date/Time   COLORURINE YELLOW 02/23/2020 1910   APPEARANCEUR CLEAR 02/23/2020 1910   LABSPEC 1.006 02/23/2020 1910   PHURINE 7.0 02/23/2020 1910   GLUCOSEU NEGATIVE 02/23/2020 1910   HGBUR NEGATIVE 02/23/2020 1910   BILIRUBINUR NEGATIVE 02/23/2020  1910   KETONESUR NEGATIVE 02/23/2020 1910   PROTEINUR NEGATIVE 02/23/2020 1910   NITRITE NEGATIVE 02/23/2020 1910   LEUKOCYTESUR NEGATIVE 02/23/2020 1910     Les Longmore M.D. [facility] 03/05/2020, 12:26 PM   Call night coverage person covering after 7pm

## 2020-03-06 MED ORDER — MELATONIN 3 MG PO TABS
6.0000 mg | ORAL_TABLET | Freq: Every day | ORAL | Status: DC
  Administered 2020-03-06 – 2020-03-24 (×18): 6 mg via ORAL
  Filled 2020-03-06 (×19): qty 2

## 2020-03-06 NOTE — Progress Notes (Signed)
Physical Therapy Treatment Patient Details Name: Jim Dunn MRN: [id number] DOB: [birth year] Today's Date: 03/06/2020    History of Present Illness Pt is a 54 y/o male admitted secondary to hyponatremia and suicidal ideation. PMH includes CHF, COPD, seizures, and paranoid schizophrenia.     PT Comments    Patient is making progress toward PT goals. This session focused on gait training with SPC. Pt requires grossly min guard for safety with gait training and min A needed at times due to instability. Current plan remains appropriate.   Follow Up Recommendations  Home health PT     Equipment Recommendations  Rolling walker with 5" wheels    Recommendations for Other Services       Precautions / Restrictions Precautions Precautions: Fall Precaution Comments: suicide precautions     Mobility  Bed Mobility               General bed mobility comments: pt sitting EOB upon arrival   Transfers Overall transfer level: Needs assistance Equipment used: None Transfers: Sit to/from Stand Sit to Stand: Supervision            Ambulation/Gait Ambulation/Gait assistance: Min guard;Min assist Gait Distance (Feet): 300 Feet Assistive device: Straight cane Gait Pattern/deviations: Step-through pattern;Wide base of support;Narrow base of support     General Gait Details: cues for 2 point gait pattern with SPC ; grossly min guard for safety with min A needed at times especially with turning; pt with varying BOS and bilat step lengths; at times very narrow BOS    Stairs             Wheelchair Mobility    Modified Rankin (Stroke Patients Only)       Balance Overall balance assessment: Needs assistance Sitting-balance support: No upper extremity supported;Feet supported Sitting balance-Leahy Scale: Good     Standing balance support: No upper extremity supported;During functional activity Standing balance-Leahy Scale: Fair                               Cognition Arousal/Alertness: Awake/alert Behavior During Therapy: WFL for tasks assessed/performed Overall Cognitive Status: History of cognitive impairments - at baseline                                 General Comments: paranoid schizophrenia at baseline; WFL for simple tasks during session      Exercises      General Comments        Pertinent Vitals/Pain Pain Assessment: Faces Faces Pain Scale: No hurt    Home Living                      Prior Function            PT Goals (current goals can now be found in the care plan section) Progress towards PT goals: Progressing toward goals    Frequency    Min 3X/week      PT Plan Current plan remains appropriate    Co-evaluation              AM-PAC PT "6 Clicks" Mobility   Outcome Measure  Help needed turning from your back to your side while in a flat bed without using bedrails?: None Help needed moving from lying on your back to sitting on the side of a flat bed without using bedrails?: None Help needed  moving to and from a bed to a chair (including a wheelchair)?: A Little Help needed standing up from a chair using your arms (e.g., wheelchair or bedside chair)?: A Little Help needed to walk in hospital room?: A Little Help needed climbing 3-5 steps with a railing? : A Little 6 Click Score: 20    End of Session Equipment Utilized During Treatment: Gait belt Activity Tolerance: Patient tolerated treatment well Patient left: with call bell/phone within reach;with nursing/sitter in room;in chair Nurse Communication: Mobility status PT Visit Diagnosis: Unsteadiness on feet (R26.81);Muscle weakness (generalized) (M62.81)     Time: 7737-3668 PT Time Calculation (min) (ACUTE ONLY): 13 min  Charges:  $Gait Training: 8-22 mins                     Erline Levine, PTA Acute Rehabilitation Services Pager: [phone] Office: [phone]     Carolynne Edouard 03/06/2020,  4:26 PM

## 2020-03-06 NOTE — Progress Notes (Signed)
Had a conversation with pt because his MEWS did turn yellow because his heart rate is up to 118 and 116. He has been having food brought in by family from outside and has been some unhealthy choices like whole pizzas, fried chicken drenched in BBQ sauce, chicken wings, large sub sandwiches, etc.  He said he likes food from restaurants and that's all he has to do is eat.  I reminded him that he has heart problems, is on a strong diuretic, and his EF is 25-30% and that he needs to do it in moderation and make healthier choices for his food selection.  I notified Dr Thad Ranger and she said he had been doing so well and had no problems that were causing MEWS score elevation.  She said she will talk to him as well tomorrow. She said to put a note on the door saying "No outside food to be brought in".  He is on a heart healthy diet and the food choices he is making, are not healthy for his heart.   I did educate him on this matter.

## 2020-03-06 NOTE — Progress Notes (Signed)
[facility]                                                                              Patient Demographics  Jim Dunn, is a 54 y.o. male, DOB - Mar 18, 1966, WCB:[id number]  Admit date - 02/23/2020   Admitting Physician Vernelle Emerald, MD  Outpatient Primary MD for the patient is System, Pcp Not In  Outpatient specialists:   LOS - 12  days   Medical records reviewed and are as summarized below:    Chief Complaint  Patient presents with  . 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 He was also found to be hyponatremic on presentation.He had left AGAINST MEDICAL ADVICE on 6/19. On presentation he was alsohypokalemic/hypomagnesemic. He also had seizure episodes while in the waiting room. Patient reported not taking Dilantin at home.Sodium level has improved now and currently he is alert and oriented, seizure-free Due to unsafe home environment, SW assisting to place an ALF for group home. Medically stable for discharge. Social worker assisting on placement  Assessment & Plan    Principal Problem:   Hyponatremia -Suspected to be from hypotonic hypovolemic hyponatremia from dehydration -Patient was on torsemide, metolazone at home, also drinking alcohol, orthostatic and dizzy upon standing. -Urine osmolality 261, serum osmolality 252 on 6/21 -Sodium stable 131 on phenytoin, Seroquel -Stable, no acute issues, will repeat be met in a.m.  Active Problems:  Seizure disorder -Had an episode of seizures while in the waiting room, suspected to be noncompliant with Dilantin -Received loading dose of fosphenytoin, Dilantin has been restarted -No further seizures, alert  and oriented x3   Suicidal ideation, with underlying history of schizophrenia -Psychiatry was consulted, denies any suicidal ideation now, psychiatry does not recommend inpatient psych admission -Currently on BuSpar, Neurontin, Invega, Xanax as needed, recommended outpatient psych follow-up -Seroquel decreased to 200 mg daily -Psychiatry reconsulted as patient was involuntary committed by his mother on 6/29 and served by [facility] -Patient however has not been threatening or danger to self or others while inpatient.  Seen by psychiatry on 6/30, no new changes.  Prolonged QTC - Improved to 450 as per last EKG. Dose of Seroquel decreased to 200 mg HS  Chronic systolic CHF -Appear to be dry on presentation, diuretic therapy was held and patient received IV fluids -2D echo May/2021 showed EF 25 to 30%, global hypokinesis, right ventricular volume overload. -Metolazone discontinued, continue torsemide 20 mg daily, currently euvolemic  COPD:  -Not on home oxygen. Currently stable.  -On inhalers at home.  Bilateral neuropathy -Increase Neurontin to 300 3 times daily, continue tramadol as needed for pain -Patient feels bilateral feet pain is better after increasing Neurontin  Unsafe home environment -TOC team working on placement in ALF or group home  Code Status: Full CODE STATUS DVT Prophylaxis:  Lovenox Family Communication: Discussed all imaging results,  lab results, explained to the patient   Disposition Plan:     Status is: Inpatient  Remains inpatient appropriate because:Inpatient level of care appropriate due to severity of illness   Dispo: The patient is from: Home              Anticipated d/c is to: ALF              Anticipated d/c date is: 1 day              Patient currently is medically stable to d/c.  Awaiting placement      Time Spent in minutes 25 minutes  Procedures:  None  Consultants:   Psychiatry  Antimicrobials:   Anti-infectives (From admission,  onward)   None         Medications  Scheduled Meds: . atorvastatin  40 mg Oral Daily  . busPIRone  10 mg Oral TID  . carvedilol  3.125 mg Oral BID WC  . digoxin  0.125 mg Oral Daily  . enoxaparin (LOVENOX) injection  40 mg Subcutaneous Daily  . fluticasone furoate-vilanterol  1 puff Inhalation Daily  . furosemide  20 mg Intravenous Once  . gabapentin  300 mg Oral TID  . melatonin  6 mg Oral QHS  . nicotine  21 mg Transdermal Daily  . phenytoin  100 mg Oral TID  . QUEtiapine  200 mg Oral Daily  . sacubitril-valsartan  1 tablet Oral BID  . torsemide  20 mg Oral Daily   Continuous Infusions: PRN Meds:.acetaminophen **OR** acetaminophen, ALPRAZolam, ipratropium-albuterol, ondansetron **OR** ondansetron (ZOFRAN) IV, polyethylene glycol, traMADol      Subjective:   Montrel Donahoe was seen and examined today.  No acute issues, sitter at the bedside.  States did not sleep well at night.  Bilateral feet pain is improving.  Patient denies dizziness, chest pain, shortness of breath, abdominal pain, N/V/D/C, new weakness,   Objective:   Vitals:   03/06/20 0402 03/06/20 0433 03/06/20 0809 03/06/20 1237  BP: 132/74  (!) 141/87 99/69  Pulse: 75  90 99  Resp: _0 Temp: 97.9 F (36.6 C)  98.1 F (36.7 C) 98.7 F (37.1 C)  TempSrc: Oral  Oral Oral  SpO2: 95%  99% 99%  Weight:  87.2 kg      Intake/Output Summary (Last 24 hours) at 03/06/2020 1349 Last data filed at 03/06/2020 1148 Gross per 24 hour  Intake 1820 ml  Output 3320 ml  Net -1500 ml     Wt Readings from Last 3 Encounters:  03/06/20 87.2 kg  02/20/20 82.4 kg  01/27/20 90.9 kg   Physical Exam  General: Alert and oriented x 3, NAD  Cardiovascular: S1 S2 clear, RRR. No pedal edema b/l  Respiratory: CTAB  Gastrointestinal: Soft, nontender, nondistended, NBS  Ext: no pedal edema bilaterally  Neuro: no new deficits  Musculoskeletal: No cyanosis, clubbing  Skin: No rashes  Psych: Normal affect and  demeanor, alert and oriented x3     Data Reviewed:  I have personally reviewed following labs and imaging studies  Micro Results No results found for this or any previous visit (from the past 240 hour(s)).  Radiology Reports DG Chest 2 View  Result Date: 02/19/2020 CLINICAL DATA:  Weakness EXAM: CHEST - 2 VIEW COMPARISON:  01/22/2020 FINDINGS: Cardiomegaly. No confluent opacities, effusions or edema. No acute bony abnormality. IMPRESSION: Cardiomegaly.  No active disease. Electronically Signed   By: Rolm Baptise M.D.   On: 02/19/2020  21:26   CT Head Wo Contrast  Result Date: 02/23/2020 CLINICAL DATA:  Generalized weakness. EXAM: CT HEAD WITHOUT CONTRAST TECHNIQUE: Contiguous axial images were obtained from the base of the skull through the vertex without intravenous contrast. COMPARISON:  February 19, 2020 FINDINGS: Brain: No evidence of acute infarction, hemorrhage, hydrocephalus, extra-axial collection or mass lesion/mass effect. A stable, approximately 5.5 cm x 4.6 cm area of CSF attenuation is seen within the right occipital lobe. There is no evidence of associated mass effect or midline shift. Vascular: No hyperdense vessel or unexpected calcification. Skull: Normal. Negative for fracture or focal lesion. Sinuses/Orbits: No acute finding. Other: None. IMPRESSION: 1. No acute intracranial abnormality. 2. Stable right occipital lobe arachnoid cyst. Electronically Signed   By: Virgina Norfolk M.D.   On: 02/23/2020 18:34   CT HEAD WO CONTRAST  Result Date: 02/19/2020 CLINICAL DATA:  Numbness in bilateral hands and feet. EXAM: CT HEAD WITHOUT CONTRAST TECHNIQUE: Contiguous axial images were obtained from the base of the skull through the vertex without intravenous contrast. COMPARISON:  None. FINDINGS: Brain: 5.8 cm cystic structure noted in the right posterior cerebral hemisphere compatible with arachnoid cyst. No hemorrhage, hydrocephalus, acute infarction or midline shift. Vascular: No  hyperdense vessel or unexpected calcification. Skull: No acute calvarial abnormality. Sinuses/Orbits: Visualized paranasal sinuses and mastoids clear. Orbital soft tissues unremarkable. Other: None IMPRESSION: 5.8 cm posterior right arachnoid cyst. No acute intracranial abnormality. Electronically Signed   By: Rolm Baptise M.D.   On: 02/19/2020 21:46   CT Cervical Spine Wo Contrast  Result Date: 02/23/2020 CLINICAL DATA:  Generalized weakness. EXAM: CT CERVICAL SPINE WITHOUT CONTRAST TECHNIQUE: Multidetector CT imaging of the cervical spine was performed without intravenous contrast. Multiplanar CT image reconstructions were also generated. COMPARISON:  None. FINDINGS: Alignment: Normal. Skull base and vertebrae: No acute fracture. No primary bone lesion or focal pathologic process. Soft tissues and spinal canal: No prevertebral fluid or swelling. No visible canal hematoma. Disc levels: Moderate severity multilevel endplate sclerosis is seen. Moderate severity intervertebral disc space narrowing is seen at the level of C3-C4. Mild multilevel intervertebral disc space narrowing is seen throughout the remainder of the cervical spine. Normal bilateral multilevel facet joints are seen. Upper chest: Negative. Other: None. IMPRESSION: Multilevel degenerative disc disease of the cervical spine, most prominent at the level of C3-C4. Electronically Signed   By: Virgina Norfolk M.D.   On: 02/23/2020 18:38   DG Chest Portable 1 View  Result Date: 02/23/2020 CLINICAL DATA:  Seizure today. No chest symptoms. Pt states wanting to hurt his selfseizure EXAM: PORTABLE CHEST 1 VIEW COMPARISON:  None. FINDINGS: Stable mild enlarged cardiac silhouette. Low lung volumes. No focal infiltrate. No pneumothorax. No pulmonary edema. No acute osseous abnormality. IMPRESSION: 1. Low lung volumes. 2. No acute findings. Electronically Signed   By: Suzy Bouchard M.D.   On: 02/23/2020 18:16    Lab Data:  CBC: Recent Labs  Lab  03/03/20 0429 03/04/20 0309  WBC 7.7 7.8  HGB 11.9* 11.0*  HCT 37.5* 34.6*  MCV 63.7* 63.8*  PLT 202 332   Basic Metabolic Panel: Recent Labs  Lab 03/01/20 0339 03/02/20 0335 03/03/20 0429 03/04/20 0309  NA 132* 131* 131* 131*  K 3.5 4.2 3.7 3.7  CL 95* 94* 94* 95*  CO2 _0 GLUCOSE 126* 94 101* 96  BUN _1 CREATININE 0.53* 0.53* 0.52* 0.54*  CALCIUM 9.3 9.3 9.3 9.0  MG  --   --  1.8 1.7  PHOS  --   --  5.1* 5.2*   GFR: Estimated Creatinine Clearance: 116 mL/min (A) (by C-G formula based on SCr of 0.54 mg/dL (L)). Liver Function Tests: Recent Labs  Lab 03/03/20 0429 03/04/20 0309  AST 37 39  ALT 36 34  ALKPHOS 229* 198*  BILITOT 0.8 0.7  PROT 7.2 6.2*  ALBUMIN 3.5 3.2*   No results for input(s): LIPASE, AMYLASE in the last 168 hours. No results for input(s): AMMONIA in the last 168 hours. Coagulation Profile: No results for input(s): INR, PROTIME in the last 168 hours. Cardiac Enzymes: No results for input(s): CKTOTAL, CKMB, CKMBINDEX, TROPONINI in the last 168 hours. BNP (last 3 results) No results for input(s): PROBNP in the last 8760 hours. HbA1C: No results for input(s): HGBA1C in the last 72 hours. CBG: No results for input(s): GLUCAP in the last 168 hours. Lipid Profile: No results for input(s): CHOL, HDL, LDLCALC, TRIG, CHOLHDL, LDLDIRECT in the last 72 hours. Thyroid Function Tests: No results for input(s): TSH, T4TOTAL, FREET4, T3FREE, THYROIDAB in the last 72 hours. Anemia Panel: No results for input(s): VITAMINB12, FOLATE, FERRITIN, TIBC, IRON, RETICCTPCT in the last 72 hours. Urine analysis:    Component Value Date/Time   COLORURINE YELLOW 02/23/2020 1910   APPEARANCEUR CLEAR 02/23/2020 1910   LABSPEC 1.006 02/23/2020 1910   PHURINE 7.0 02/23/2020 1910   GLUCOSEU NEGATIVE 02/23/2020 1910   HGBUR NEGATIVE 02/23/2020 1910   BILIRUBINUR NEGATIVE 02/23/2020 1910   KETONESUR NEGATIVE 02/23/2020 1910   PROTEINUR NEGATIVE  02/23/2020 1910   NITRITE NEGATIVE 02/23/2020 1910   LEUKOCYTESUR NEGATIVE 02/23/2020 1910     Bj Morlock M.D. [facility] 03/06/2020, 1:49 PM   Call night coverage person covering after 7pm

## 2020-03-07 LAB — BASIC METABOLIC PANEL
Anion gap: 8 (ref 5–15)
BUN: 15 mg/dL (ref 6–20)
CO2: 26 mmol/L (ref 22–32)
Calcium: 8.9 mg/dL (ref 8.9–10.3)
Chloride: 99 mmol/L (ref 98–111)
Creatinine, Ser: 0.69 mg/dL (ref 0.61–1.24)
GFR calc Af Amer: >60 mL/min (ref >60)
GFR calc non Af Amer: >60 mL/min (ref >60)
Glucose, Bld: 112 mg/dL — ABNORMAL HIGH (ref 70–99)
Potassium: 3.8 mmol/L (ref 3.5–5.1)
Sodium: 133 mmol/L — ABNORMAL LOW (ref 135–145)

## 2020-03-07 NOTE — Progress Notes (Addendum)
/21          [facility]                                                                              Patient Demographics  Jim Dunn, is a 54 y.o. male, DOB - 1966-03-16, VXB:[id number]  Admit date - 02/23/2020   Admitting Physician Marinda Elk, MD  Outpatient Primary MD for the patient is System, Pcp Not In  Outpatient specialists:   LOS - 13  days   Medical records reviewed and are as summarized below:    Chief Complaint  Patient presents with  . 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 He was also found to be hyponatremic on presentation.He had left AGAINST MEDICAL ADVICE on 6/19. On presentation he was alsohypokalemic/hypomagnesemic. He also had seizure episodes while in the waiting room. Patient reported not taking Dilantin at home.Sodium level has improved now and currently he is alert and oriented, seizure-free Due to unsafe home environment, SW assisting to place an ALF for group home. Medically stable for discharge. Social worker assisting on placement  03/07/20: No acute issues, awaiting placement  Assessment & Plan    Principal Problem:   Hyponatremia -Suspected to be from hypotonic hypovolemic hyponatremia from dehydration -Patient was on torsemide, metolazone at home, also drinking alcohol, orthostatic and dizzy upon standing. -Urine osmolality 261, serum osmolality 252 on 6/21 -Sodium stable 131 on phenytoin, Seroquel -Sodium now stable, 133  Active Problems:  Seizure disorder -Had an episode of seizures while in the waiting room, suspected to be noncompliant with Dilantin -Received loading dose of fosphenytoin, Dilantin has been restarted No  repeat seizures, alert and oriented x3   Suicidal ideation, with underlying history of schizophrenia -Psychiatry was consulted, denies any suicidal ideation now, psychiatry does not recommend inpatient psych admission -Currently on BuSpar, Neurontin, Invega, Xanax as needed, recommended outpatient psych follow-up -Seroquel decreased to 200 mg daily -Psychiatry reconsulted as patient was involuntary committed by his mother on 6/29 and served by [facility] -Patient however has not been threatening or danger to self or others while inpatient.  Seen by psychiatry on 6/30, no new changes.  Prolonged QTC - Improved to 450 as per last EKG. Dose of Seroquel decreased to 200 mg HS  Chronic systolic CHF -Appear to be dry on presentation, diuretic therapy was held and patient received IV fluids -2D echo May/2021 showed EF 25 to 30%, global hypokinesis, right ventricular volume overload. -Metolazone discontinued, continue torsemide 20 mg daily -Patient has been ordering food from outside, family also bringing him unhealthy choices, counseled the patient strongly on heart healthy diet.   COPD:  -Not on home oxygen. Currently stable.  -On inhalers at home.  Bilateral neuropathy -Increase Neurontin to 300 3 times daily, continue tramadol as needed for pain -Patient feels bilateral feet pain is better after increasing Neurontin  Unsafe home environment -TOC team working on  placement in ALF or group home  Code Status: Full CODE STATUS DVT Prophylaxis:  Lovenox Family Communication: Discussed all imaging results, lab results, explained to the patient   Disposition Plan:     Status is: Inpatient  Remains inpatient appropriate because:Inpatient level of care appropriate due to severity of illness   Dispo: The patient is from: Home              Anticipated d/c is to: ALF              Anticipated d/c date is: 1 day              Patient currently is medically stable to d/c.  Awaiting  placement    Time Spent in minutes 25 minutes  Procedures:  None  Consultants:   Psychiatry  Antimicrobials:   Anti-infectives (From admission, onward)   None         Medications  Scheduled Meds: . atorvastatin  40 mg Oral Daily  . busPIRone  10 mg Oral TID  . carvedilol  3.125 mg Oral BID WC  . digoxin  0.125 mg Oral Daily  . enoxaparin (LOVENOX) injection  40 mg Subcutaneous Daily  . fluticasone furoate-vilanterol  1 puff Inhalation Daily  . furosemide  20 mg Intravenous Once  . gabapentin  300 mg Oral TID  . melatonin  6 mg Oral QHS  . nicotine  21 mg Transdermal Daily  . phenytoin  100 mg Oral TID  . QUEtiapine  200 mg Oral Daily  . sacubitril-valsartan  1 tablet Oral BID  . torsemide  20 mg Oral Daily   Continuous Infusions: PRN Meds:.acetaminophen **OR** acetaminophen, ALPRAZolam, ipratropium-albuterol, ondansetron **OR** ondansetron (ZOFRAN) IV, polyethylene glycol, traMADol      Subjective:   Jim Dunn was seen and examined today. No acute issues, sitter at bedside. Bilateral feet pain is improving. Patient denies dizziness, chest pain, shortness of breath, abdominal pain, N/V/D/C, new weakness,   Objective:   Vitals:   03/07/20 0444 03/07/20 0500 03/07/20 0800 03/07/20 1015  BP: 133/86  100/77 106/70  Pulse: 90  100   Resp: 18  18   Temp: (!) 97.4 F (36.3 C)  98.2 F (36.8 C)   TempSrc: Oral  Oral   SpO2: 100%  99%   Weight:  89.4 kg      Intake/Output Summary (Last 24 hours) at 03/07/2020 1127 Last data filed at 03/06/2020 1700 Gross per 24 hour  Intake 440 ml  Output 600 ml  Net -160 ml     Wt Readings from Last 3 Encounters:  03/07/20 89.4 kg  02/20/20 82.4 kg  01/27/20 90.9 kg    Physical Exam  General: Alert and oriented x 3, NAD  Cardiovascular: S1 S2 clear, RRR. No pedal edema b/l  Respiratory: CTAB  Gastrointestinal: Soft, nontender, nondistended, NBS  Ext: no pedal edema bilaterally  Neuro: no new  deficits  Musculoskeletal: No cyanosis, clubbing  Skin: No rashes  Psych: Normal affect and demeanor, alert and oriented x3     Data Reviewed:  I have personally reviewed following labs and imaging studies  Micro Results No results found for this or any previous visit (from the past 240 hour(s)).  Radiology Reports DG Chest 2 View  Result Date: 02/19/2020 CLINICAL DATA:  Weakness EXAM: CHEST - 2 VIEW COMPARISON:  01/22/2020 FINDINGS: Cardiomegaly. No confluent opacities, effusions or edema. No acute bony abnormality. IMPRESSION: Cardiomegaly.  No active disease. Electronically Signed   By: Caryn Bee  Dover M.D.   On: 02/19/2020 21:26   CT Head Wo Contrast  Result Date: 02/23/2020 CLINICAL DATA:  Generalized weakness. EXAM: CT HEAD WITHOUT CONTRAST TECHNIQUE: Contiguous axial images were obtained from the base of the skull through the vertex without intravenous contrast. COMPARISON:  February 19, 2020 FINDINGS: Brain: No evidence of acute infarction, hemorrhage, hydrocephalus, extra-axial collection or mass lesion/mass effect. A stable, approximately 5.5 cm x 4.6 cm area of CSF attenuation is seen within the right occipital lobe. There is no evidence of associated mass effect or midline shift. Vascular: No hyperdense vessel or unexpected calcification. Skull: Normal. Negative for fracture or focal lesion. Sinuses/Orbits: No acute finding. Other: None. IMPRESSION: 1. No acute intracranial abnormality. 2. Stable right occipital lobe arachnoid cyst. Electronically Signed   By: Aram Candela M.D.   On: 02/23/2020 18:34   CT HEAD WO CONTRAST  Result Date: 02/19/2020 CLINICAL DATA:  Numbness in bilateral hands and feet. EXAM: CT HEAD WITHOUT CONTRAST TECHNIQUE: Contiguous axial images were obtained from the base of the skull through the vertex without intravenous contrast. COMPARISON:  None. FINDINGS: Brain: 5.8 cm cystic structure noted in the right posterior cerebral hemisphere compatible with  arachnoid cyst. No hemorrhage, hydrocephalus, acute infarction or midline shift. Vascular: No hyperdense vessel or unexpected calcification. Skull: No acute calvarial abnormality. Sinuses/Orbits: Visualized paranasal sinuses and mastoids clear. Orbital soft tissues unremarkable. Other: None IMPRESSION: 5.8 cm posterior right arachnoid cyst. No acute intracranial abnormality. Electronically Signed   By: Charlett Nose M.D.   On: 02/19/2020 21:46   CT Cervical Spine Wo Contrast  Result Date: 02/23/2020 CLINICAL DATA:  Generalized weakness. EXAM: CT CERVICAL SPINE WITHOUT CONTRAST TECHNIQUE: Multidetector CT imaging of the cervical spine was performed without intravenous contrast. Multiplanar CT image reconstructions were also generated. COMPARISON:  None. FINDINGS: Alignment: Normal. Skull base and vertebrae: No acute fracture. No primary bone lesion or focal pathologic process. Soft tissues and spinal canal: No prevertebral fluid or swelling. No visible canal hematoma. Disc levels: Moderate severity multilevel endplate sclerosis is seen. Moderate severity intervertebral disc space narrowing is seen at the level of C3-C4. Mild multilevel intervertebral disc space narrowing is seen throughout the remainder of the cervical spine. Normal bilateral multilevel facet joints are seen. Upper chest: Negative. Other: None. IMPRESSION: Multilevel degenerative disc disease of the cervical spine, most prominent at the level of C3-C4. Electronically Signed   By: Aram Candela M.D.   On: 02/23/2020 18:38   DG Chest Portable 1 View  Result Date: 02/23/2020 CLINICAL DATA:  Seizure today. No chest symptoms. Pt states wanting to hurt his selfseizure EXAM: PORTABLE CHEST 1 VIEW COMPARISON:  None. FINDINGS: Stable mild enlarged cardiac silhouette. Low lung volumes. No focal infiltrate. No pneumothorax. No pulmonary edema. No acute osseous abnormality. IMPRESSION: 1. Low lung volumes. 2. No acute findings. Electronically Signed    By: Genevive Bi M.D.   On: 02/23/2020 18:16    Lab Data:  CBC: Recent Labs  Lab 03/03/20 0429 03/04/20 0309  WBC 7.7 7.8  HGB 11.9* 11.0*  HCT 37.5* 34.6*  MCV 63.7* 63.8*  PLT 202 188   Basic Metabolic Panel: Recent Labs  Lab 03/01/20 0339 03/02/20 0335 03/03/20 0429 03/04/20 0309 03/07/20 0056  NA 132* 131* 131* 131* 133*  K 3.5 4.2 3.7 3.7 3.8  CL 95* 94* 94* 95* 99  CO2 28 28 27 28 26   GLUCOSE 126* 94 101* 96 112*  BUN 9 9 8 13 15   CREATININE 0.53* 0.53* 0.52*  0.54* 0.69  CALCIUM 9.3 9.3 9.3 9.0 8.9  MG  --   --  1.8 1.7  --   PHOS  --   --  5.1* 5.2*  --    GFR: Estimated Creatinine Clearance: 117.3 mL/min (by C-G formula based on SCr of 0.69 mg/dL). Liver Function Tests: Recent Labs  Lab 03/03/20 0429 03/04/20 0309  AST 37 39  ALT 36 34  ALKPHOS 229* 198*  BILITOT 0.8 0.7  PROT 7.2 6.2*  ALBUMIN 3.5 3.2*   No results for input(s): LIPASE, AMYLASE in the last 168 hours. No results for input(s): AMMONIA in the last 168 hours. Coagulation Profile: No results for input(s): INR, PROTIME in the last 168 hours. Cardiac Enzymes: No results for input(s): CKTOTAL, CKMB, CKMBINDEX, TROPONINI in the last 168 hours. BNP (last 3 results) No results for input(s): PROBNP in the last 8760 hours. HbA1C: No results for input(s): HGBA1C in the last 72 hours. CBG: No results for input(s): GLUCAP in the last 168 hours. Lipid Profile: No results for input(s): CHOL, HDL, LDLCALC, TRIG, CHOLHDL, LDLDIRECT in the last 72 hours. Thyroid Function Tests: No results for input(s): TSH, T4TOTAL, FREET4, T3FREE, THYROIDAB in the last 72 hours. Anemia Panel: No results for input(s): VITAMINB12, FOLATE, FERRITIN, TIBC, IRON, RETICCTPCT in the last 72 hours. Urine analysis:    Component Value Date/Time   COLORURINE YELLOW 02/23/2020 1910   APPEARANCEUR CLEAR 02/23/2020 1910   LABSPEC 1.006 02/23/2020 1910   PHURINE 7.0 02/23/2020 1910   GLUCOSEU NEGATIVE 02/23/2020  1910   HGBUR NEGATIVE 02/23/2020 1910   BILIRUBINUR NEGATIVE 02/23/2020 1910   KETONESUR NEGATIVE 02/23/2020 1910   PROTEINUR NEGATIVE 02/23/2020 1910   NITRITE NEGATIVE 02/23/2020 1910   LEUKOCYTESUR NEGATIVE 02/23/2020 1910     Madilyne Tadlock M.D. [facility] 03/07/2020, 11:27 AM   Call night coverage person covering after 7pm

## 2020-03-07 NOTE — Progress Notes (Signed)
PROGRESS NOTE    Jim Dunn  TGG:[id number]  DOB: [birth year]  PCP: System, Pcp Not In Admit date:02/23/2020 Chief compliant: suicidal ideation and seizures 54 year old male with past medical history of schizophrenia, seizure disorder, biventricular heart failure (Echo 01/2020 EF 25-30% with right-sided heart failure), severe tricuspid regurgitation, nicotine dependence and COPD who presents to [facility] emergency department with suicidal ideation and seizure.Patient explains that since he left the hospital several days ago (admitted 6/17-6/19- was being managed for hyponatremia, thought to be multifactorial in origin.  Patient left AMA on 6/19 and that time had sodium of 132), he has been experiencing increasing fatigue.  This increasing fatigue is associated with bilateral lower extremity tingling.  Symptoms were similar to what prompted his presentation on 6/17. Additionally, he reported feeling depressed with suicidal ideation, without a plan.  Patient reported that he had attempted suicide in the distant past using pills but was unsuccessful and never sought medical attention. ED Course: While the patient was waiting in the waiting room, he experienced a witnessed seizure while attempting to walk to the bathroom.  Patient was immediately brought back to the emergency department for further evaluation.  Patient was found to have substantial recurrent hyponatremia of 117 with concurrent hypokalemia of 2.6 and hypomagnesium of 1.4.  Patient was administered 1236 mg of intravenous Fosphenytoin and initiated on low rate normal saline infusion.  Patient was administered 2 g of intravenous magnesium and a 30 mEq of potassium.  Hospital course: Patient admitted to [facility] for further management.   Subjective:  Patient sitting comfortably in bedside chair and talking on the phone without any distress...  Denies any acute complaints.  He is aware of pending ALF placement and agreeable.  Bedside  sitter noted.  Objective: Vitals:   03/08/20 0407 03/08/20 0532 03/08/20 0705 03/08/20 0926  BP: 116/86  118/77   Pulse: 90  84   Resp:   16   Temp: 98 F (36.7 C)  97.9 F (36.6 C)   TempSrc: Oral  Oral   SpO2: 100%  100% 98%  Weight:  88.4 kg     No intake or output data in the 24 hours ending 03/08/20 0945 Filed Weights   03/06/20 0433 03/07/20 0500 03/08/20 0532  Weight: 87.2 kg 89.4 kg 88.4 kg    Physical Examination:  General: Moderately built, no acute distress noted Head ENT: Atraumatic normocephalic, PERRLA, neck supple Heart: S1-S2 heard, regular rate and rhythm, no murmurs.  No leg edema noted Lungs: Equal air entry bilaterally, no rhonchi or rales on exam, no accessory muscle use Abdomen: Bowel sounds heard, soft, nontender, nondistended. No organomegaly.  No CVA tenderness Extremities: No pedal edema.  No cyanosis or clubbing. Neurological: Awake alert oriented x3, no focal weakness or numbness, strength and sensations to crude touch intact Skin: No wounds or rashes. Mood: Appears stable and appropriate.  Data Reviewed: I have personally reviewed following labs and imaging studies  CBC: Recent Labs  Lab 03/03/20 0429 03/04/20 0309 03/08/20 0701  WBC 7.7 7.8 8.9  HGB 11.9* 11.0* 11.2*  HCT 37.5* 34.6* 35.8*  MCV 63.7* 63.8* 64.2*  PLT 202 188 195   Basic Metabolic Panel: Recent Labs  Lab 03/02/20 0335 03/03/20 0429 03/04/20 0309 03/07/20 0056 03/08/20 0701  NA 131* 131* 131* 133* 133*  K 4.2 3.7 3.7 3.8 4.1  CL 94* 94* 95* 99 97*  CO2 28 27 28 26 27   GLUCOSE 94 101* 96 112* 95  BUN 9 8 13  15 9  CREATININE 0.53* 0.52* 0.54* 0.69 0.47*  CALCIUM 9.3 9.3 9.0 8.9 9.3  MG  --  1.8 1.7  --   --   PHOS  --  5.1* 5.2*  --   --    GFR: Estimated Creatinine Clearance: 116.7 mL/min (A) (by C-G formula based on SCr of 0.47 mg/dL (L)). Liver Function Tests: Recent Labs  Lab 03/03/20 0429 03/04/20 0309  AST 37 39  ALT 36 34  ALKPHOS 229* 198*    BILITOT 0.8 0.7  PROT 7.2 6.2*  ALBUMIN 3.5 3.2*   No results for input(s): LIPASE, AMYLASE in the last 168 hours. No results for input(s): AMMONIA in the last 168 hours. Coagulation Profile: No results for input(s): INR, PROTIME in the last 168 hours. Cardiac Enzymes: No results for input(s): CKTOTAL, CKMB, CKMBINDEX, TROPONINI in the last 168 hours. BNP (last 3 results) No results for input(s): PROBNP in the last 8760 hours. HbA1C: No results for input(s): HGBA1C in the last 72 hours. CBG: No results for input(s): GLUCAP in the last 168 hours. Lipid Profile: No results for input(s): CHOL, HDL, LDLCALC, TRIG, CHOLHDL, LDLDIRECT in the last 72 hours. Thyroid Function Tests: No results for input(s): TSH, T4TOTAL, FREET4, T3FREE, THYROIDAB in the last 72 hours. Anemia Panel: No results for input(s): VITAMINB12, FOLATE, FERRITIN, TIBC, IRON, RETICCTPCT in the last 72 hours. Sepsis Labs: No results for input(s): PROCALCITON, LATICACIDVEN in the last 168 hours.  No results found for this or any previous visit (from the past 240 hour(s)).    Radiology Studies: No results found.    Scheduled Meds: . atorvastatin  40 mg Oral Daily  . busPIRone  10 mg Oral TID  . carvedilol  3.125 mg Oral BID WC  . digoxin  0.125 mg Oral Daily  . enoxaparin (LOVENOX) injection  40 mg Subcutaneous Daily  . fluticasone furoate-vilanterol  1 puff Inhalation Daily  . furosemide  20 mg Intravenous Once  . gabapentin  300 mg Oral TID  . melatonin  6 mg Oral QHS  . nicotine  21 mg Transdermal Daily  . phenytoin  100 mg Oral TID  . QUEtiapine  200 mg Oral Daily  . sacubitril-valsartan  1 tablet Oral BID  . torsemide  20 mg Oral Daily   Continuous Infusions:    Assessment/Plan:  1. Hyponatremia with seizures: Likely hypovolemic hyponatremia due to diuretic use/dehydration.  Serum osmolality 252, urine osmolality 261.  Patient was on torsemide, metolazone at home-currently on hold.  He was volume  depleted, orthostatic and dizzy upon standing.  Sodium now improved with hydration, stable at 133.  Torsemide resumed at 20 mg daily but metolazone remains discontinued.   2. Depression, Suicidal ideations: Patient with known h/o paranoid schizophrenia.  Seen by psychiatry who did not feel patient needed inpatient psychiatric admission.  Seroquel dose decreased to 200 mg daily.  Recommended to continue BuSpar Neurontin, Invega, Xanax as needed and to follow-up psychiatry as outpatient.  Psychiatry reconsulted on 6/29 after IVC taken out by mother on 6/29, served by [employer].  Followed up by psychiatry on 6/30 with no new medication changes.  3. Seizure disorder: In the setting of problem #1 and noncompliance with home medications.  Received loading dose of fosphenytoin in the ED, home dose of Dilantin has been resumed.  Stable now without any recurrence.  4. Chronic systolic CHF: Appeared hypovolemic on presentation.  Diuretic therapy held in concern for problem #1.  Echo from May 2021 showed EF 25  to 30%.  Continue Entresto, Coreg, torsemide resumed at 20 mg daily but metolazone remains discontinued.  There have been concerns for noncompliance with low-salt diet, patient and also family bringing food from outside.  5. COPD: stable. No evidence of COPD exacerbation. Resumed home bronchodilator therapy  6. Hyperlipidemia: on statins  7. Hypokalemia/hypomagnesemia: replace as needed.  8.  Prolonged QTC: Seroquel dose reduced to 200 mg nightly and electrolytes replaced as above.  Last EKG showed improvement in QT interval to 450 ms.  9.  Neuropathy: Neurontin dosage increased to 300 mg 3 times daily, also on tramadol as needed for pain -would avoid tramadol with seizure disorder-patient agreed to discontinuing.  10. Unsafe home environment-TOC team working on placement in ALF or group home  DVT prophylaxis: Lovenox Code Status: Full code Family / Patient Communication:  Disposition Plan:   Status  is: Inpatient  Remains inpatient appropriate because:Unsafe d/c plan   Dispo: The patient is from: Home              Anticipated d/c is to: ALF              Anticipated d/c date is: 1 day              Patient currently is medically stable to d/c.    Time spent: 15 minutes     >50% time spent in discussions with care team and coordination of care.    Alessandra Bevels, MD [facility] Pager in [place]  If 7PM-7AM, please contact night-coverage [email or web address] 03/08/2020, 9:45 AM

## 2020-03-07 NOTE — Plan of Care (Signed)

## 2020-03-08 LAB — BASIC METABOLIC PANEL
Anion gap: 9 (ref 5–15)
BUN: 9 mg/dL (ref 6–20)
CO2: 27 mmol/L (ref 22–32)
Calcium: 9.3 mg/dL (ref 8.9–10.3)
Chloride: 97 mmol/L — ABNORMAL LOW (ref 98–111)
Creatinine, Ser: 0.47 mg/dL — ABNORMAL LOW (ref 0.61–1.24)
GFR calc Af Amer: >60 mL/min (ref >60)
GFR calc non Af Amer: >60 mL/min (ref >60)
Glucose, Bld: 95 mg/dL (ref 70–99)
Potassium: 4.1 mmol/L (ref 3.5–5.1)
Sodium: 133 mmol/L — ABNORMAL LOW (ref 135–145)

## 2020-03-08 LAB — CBC
HCT: 35.8 % — ABNORMAL LOW (ref 39.0–52.0)
Hemoglobin: 11.2 g/dL — ABNORMAL LOW (ref 13.0–17.0)
MCH: 20.1 pg — ABNORMAL LOW (ref 26.0–34.0)
MCHC: 31.3 g/dL (ref 30.0–36.0)
MCV: 64.2 fL — ABNORMAL LOW (ref 80.0–100.0)
Platelets: 195 10*3/uL (ref 150–400)
RBC: 5.58 MIL/uL (ref 4.22–5.81)
RDW: 26.4 % — ABNORMAL HIGH (ref 11.5–15.5)
WBC: 8.9 10*3/uL (ref 4.0–10.5)
nRBC: 0 % (ref 0.0–0.2)

## 2020-03-08 NOTE — Progress Notes (Signed)
Occupational Therapy Treatment Patient Details Name: Jim Dunn MRN: [id number] DOB: 1966-04-17 Today's Date: 03/08/2020    History of present illness Pt is a 54 y/o male admitted secondary to hyponatremia and suicidal ideation. PMH includes CHF, COPD, seizures, and paranoid schizophrenia.    OT comments  Pt making steady progress towards OT goals this session. Session focus on education related to decreasing risk of falls and review of therex with level 1 theraputty. Pt able to demo therex from last session and reports no increased pain. Issued pt handout related to fall prevention strategies with pt receptive to education and verbalizes understanding. Agree with DC plan, will follow acutely per POC.    Follow Up Recommendations  Other (comment);Supervision - Intermittent (S wtih all medication and financial management)    Equipment Recommendations       Recommendations for Other Services      Precautions / Restrictions Precautions Precautions: Fall Precaution Comments: suicide precautions; sitter Restrictions Weight Bearing Restrictions: No       Mobility Bed Mobility               General bed mobility comments: pt OOB in recliner  Transfers                 General transfer comment: pt reports having already walked with sitter. pt reports good success with using cane    Balance Overall balance assessment: Needs assistance Sitting-balance support: No upper extremity supported;Feet supported Sitting balance-Leahy Scale: Good                                     ADL either performed or assessed with clinical judgement   ADL Overall ADL's : Needs assistance/impaired                                       General ADL Comments: pt dressed and sitting in recliner upon arrival. session focus on fall prevention strategies and review of theraputty exercises to increase [facility] for higher level BADL tasks     Vision Baseline  Vision/History: Wears glasses (pt reports reading glasses at baseline but doesn't have them here)     Perception     Praxis      Cognition Arousal/Alertness: Awake/alert Behavior During Therapy: WFL for tasks assessed/performed Overall Cognitive Status: History of cognitive impairments - at baseline                                 General Comments: paranoid schizophrenia at baseline; able to recall therex from last session        Exercises Hand Exercises Digit Composite Flexion: AROM;Both;5 reps;Seated;Other (comment) (theraputty) Composite Extension: AROM;Both;5 reps;Seated;Other (comment) (theraputty) Digit Composite Abduction: AROM;Both;5 reps;Seated;Other (comment) (theraputty) Digit Composite Adduction: AROM;Both;5 reps;Seated;Other (comment) (theraputty) Thumb Abduction: AROM;Both;5 reps;Seated;Other (comment) (theraputty) Thumb Adduction: AROM;5 reps;Seated;Other (comment);Both (threaputty) Other Exercises Other Exercises: pt reports no increased pain from therex   Shoulder Instructions       General Comments reviewed theraputty therex and issued pt handout regarding fall prevention strategies    Pertinent Vitals/ Pain       Pain Assessment: No/denies pain  Home Living  Prior Functioning/Environment              Frequency  Min 2X/week        Progress Toward Goals  OT Goals(current goals can now be found in the care plan section)  Progress towards OT goals: Progressing toward goals  Acute Rehab OT Goals Patient Stated Goal: to have somewhere to go  OT Goal Formulation: With patient Time For Goal Achievement: 03/11/20 Potential to Achieve Goals: Good  Plan Discharge plan remains appropriate;Frequency remains appropriate    Co-evaluation                 AM-PAC OT "6 Clicks" Daily Activity     Outcome Measure   Help from another person eating meals?: None Help from  another person taking care of personal grooming?: None Help from another person toileting, which includes using toliet, bedpan, or urinal?: A Little Help from another person bathing (including washing, rinsing, drying)?: A Little Help from another person to put on and taking off regular upper body clothing?: None Help from another person to put on and taking off regular lower body clothing?: None 6 Click Score: 22    End of Session    OT Visit Diagnosis: Unsteadiness on feet (R26.81)   Activity Tolerance Patient tolerated treatment well   Patient Left in chair;with call bell/phone within reach;with chair alarm set;with nursing/sitter in room   Nurse Communication Mobility status        Time: 0810-0820 OT Time Calculation (min): 10 min  Charges: OT General Charges $OT Visit: 1 Visit OT Treatments $Therapeutic Activity: 8-22 mins  Audery Amel., COTA/L Acute Rehabilitation Services [phone]    Angelina Pih 03/08/2020, 8:25 AM

## 2020-03-09 NOTE — Progress Notes (Signed)
IVC orders expired 03/09/2020 at 1210. Patient is not a harm to himself or others. The patient has not made comments about seeing things or been physically abusive as IVC paperwork states. Patient is A&O x4 at this time, VS WNL. Social Worker still looking for placement. Jim Dunn

## 2020-03-09 NOTE — Progress Notes (Signed)
PROGRESS NOTE    Jim Dunn  BJY:[id number]  DOB: 01/19/1966  PCP: System, Pcp Not In Admit date:02/23/2020 Chief compliant: suicidal ideation and seizures 54 year old male with past medical history of schizophrenia, seizure disorder, biventricular heart failure (Echo 01/2020 EF 25-30% with right-sided heart failure), severe tricuspid regurgitation, nicotine dependence and COPD who presents to [facility] emergency department with suicidal ideation and seizure.Patient explains that since he left the hospital several days ago (admitted 6/17-6/19- was being managed for hyponatremia, thought to be multifactorial in origin.  Patient left AMA on 6/19 and that time had sodium of 132), he has been experiencing increasing fatigue.  This increasing fatigue is associated with bilateral lower extremity tingling.  Symptoms were similar to what prompted his presentation on 6/17. Additionally, he reported feeling depressed with suicidal ideation, without a plan.  Patient reported that he had attempted suicide in the distant past using pills but was unsuccessful and never sought medical attention. ED Course: While the patient was waiting in the waiting room, he experienced a witnessed seizure while attempting to walk to the bathroom.  Patient was immediately brought back to the emergency department for further evaluation.  Patient was found to have substantial recurrent hyponatremia of 117 with concurrent hypokalemia of 2.6 and hypomagnesium of 1.4.  Patient was administered 1236 mg of intravenous Fosphenytoin and initiated on low rate normal saline infusion.  Patient was administered 2 g of intravenous magnesium and a 30 mEq of potassium.  Hospital course: Patient admitted to [facility] for further management.   Subjective:  Patient sleeping comfortably, no acute issues. Answers questions appropriately. Sitter at bedside.   Objective: Vitals:   03/08/20 2319 03/09/20 0415 03/09/20 0559 03/09/20 0841  BP:  126/70 130/72    Pulse: 84 90  61  Resp: 17 18  19   Temp: 98.4 F (36.9 C) 98.4 F (36.9 C)    TempSrc: Oral Oral    SpO2: 98% 99%  98%  Weight:   88.3 kg     Intake/Output Summary (Last 24 hours) at 03/09/2020 1226 Last data filed at 03/09/2020 05/10/2020 Gross per 24 hour  Intake 2580 ml  Output --  Net 2580 ml   Filed Weights   03/08/20 0532 03/08/20 1619 03/09/20 0559  Weight: 88.4 kg 91.5 kg 88.3 kg    Physical Examination:  General: Moderately built, no acute distress noted Head ENT: Atraumatic normocephalic, PERRLA, neck supple Heart: S1-S2 heard, regular rate and rhythm, no murmurs.  No leg edema noted Lungs: Equal air entry bilaterally, no rhonchi or rales on exam, no accessory muscle use Abdomen: Bowel sounds heard, soft, nontender, nondistended. No organomegaly.  No CVA tenderness Extremities: No pedal edema.  No cyanosis or clubbing. Neurological: Awake alert oriented x3, no focal weakness or numbness, strength and sensations to crude touch intact Skin: No wounds or rashes. Mood: Appears stable and appropriate.  Data Reviewed: I have personally reviewed following labs and imaging studies  CBC: Recent Labs  Lab 03/03/20 0429 03/04/20 0309 03/08/20 0701  WBC 7.7 7.8 8.9  HGB 11.9* 11.0* 11.2*  HCT 37.5* 34.6* 35.8*  MCV 63.7* 63.8* 64.2*  PLT 202 188 195   Basic Metabolic Panel: Recent Labs  Lab 03/03/20 0429 03/04/20 0309 03/07/20 0056 03/08/20 0701  NA 131* 131* 133* 133*  K 3.7 3.7 3.8 4.1  CL 94* 95* 99 97*  CO2 27 28 26 27   GLUCOSE 101* 96 112* 95  BUN 8 13 15 9   CREATININE 0.52* 0.54* 0.69 0.47*  CALCIUM 9.3 9.0 8.9 9.3  MG 1.8 1.7  --   --   PHOS 5.1* 5.2*  --   --    GFR: Estimated Creatinine Clearance: 116.7 mL/min (A) (by C-G formula based on SCr of 0.47 mg/dL (L)). Liver Function Tests: Recent Labs  Lab 03/03/20 0429 03/04/20 0309  AST 37 39  ALT 36 34  ALKPHOS 229* 198*  BILITOT 0.8 0.7  PROT 7.2 6.2*  ALBUMIN 3.5 3.2*   No  results for input(s): LIPASE, AMYLASE in the last 168 hours. No results for input(s): AMMONIA in the last 168 hours. Coagulation Profile: No results for input(s): INR, PROTIME in the last 168 hours. Cardiac Enzymes: No results for input(s): CKTOTAL, CKMB, CKMBINDEX, TROPONINI in the last 168 hours. BNP (last 3 results) No results for input(s): PROBNP in the last 8760 hours. HbA1C: No results for input(s): HGBA1C in the last 72 hours. CBG: No results for input(s): GLUCAP in the last 168 hours. Lipid Profile: No results for input(s): CHOL, HDL, LDLCALC, TRIG, CHOLHDL, LDLDIRECT in the last 72 hours. Thyroid Function Tests: No results for input(s): TSH, T4TOTAL, FREET4, T3FREE, THYROIDAB in the last 72 hours. Anemia Panel: No results for input(s): VITAMINB12, FOLATE, FERRITIN, TIBC, IRON, RETICCTPCT in the last 72 hours. Sepsis Labs: No results for input(s): PROCALCITON, LATICACIDVEN in the last 168 hours.  No results found for this or any previous visit (from the past 240 hour(s)).    Radiology Studies: No results found.    Scheduled Meds: . atorvastatin  40 mg Oral Daily  . busPIRone  10 mg Oral TID  . carvedilol  3.125 mg Oral BID WC  . digoxin  0.125 mg Oral Daily  . enoxaparin (LOVENOX) injection  40 mg Subcutaneous Daily  . fluticasone furoate-vilanterol  1 puff Inhalation Daily  . furosemide  20 mg Intravenous Once  . gabapentin  300 mg Oral TID  . melatonin  6 mg Oral QHS  . nicotine  21 mg Transdermal Daily  . phenytoin  100 mg Oral TID  . QUEtiapine  200 mg Oral Daily  . sacubitril-valsartan  1 tablet Oral BID  . torsemide  20 mg Oral Daily   Continuous Infusions:    Assessment/Plan:  1. Hyponatremia with seizures: Likely hypovolemic hyponatremia due to diuretic use/dehydration.  Serum osmolality 252, urine osmolality 261.  Patient was on torsemide, metolazone at home-currently on hold.  He was volume depleted, orthostatic and dizzy upon standing.  Sodium now  improved with hydration, remains stable at 133. Torsemide resumed at 20 mg daily but metolazone remains discontinued.   2. Depression, Suicidal ideations: Patient with known h/o paranoid schizophrenia.  Seen by psychiatry who did not feel patient needed inpatient psychiatric admission.  Seroquel dose decreased to 200 mg daily.  Recommended to continue BuSpar Neurontin, Invega, Xanax as needed and to follow-up psychiatry as outpatient.  Psychiatry reconsulted on 6/29 after IVC taken out by mother on 6/29, served by [employer].  Followed up by psychiatry on 6/30 with no new medication changes.  3. Seizure disorder: In the setting of problem #1 and noncompliance with home medications.  Received loading dose of fosphenytoin in the ED, home dose of Dilantin has been resumed.  Stable now without any recurrence.  4. Chronic systolic CHF: Appeared hypovolemic on presentation.  Diuretic therapy held in concern for problem #1.  Echo from May 2021 showed EF 25 to 30%.  Continue Entresto, Coreg, torsemide resumed at 20 mg daily but metolazone remains discontinued.  There  have been concerns for noncompliance with low-salt diet, patient and also family bringing food from outside.  5. COPD: stable. No evidence of COPD exacerbation. Resumed home bronchodilator therapy  6. Hyperlipidemia: on statins  7. Hypokalemia/hypomagnesemia: replace as needed.  8.  Prolonged QTC: Seroquel dose reduced to 200 mg nightly and electrolytes replaced as above.  Last EKG showed improvement in QT interval to 450 ms.  9.  Neuropathy: Neurontin dosage increased to 300 mg 3 times daily, was on tramadol as needed for pain -discontinued given seizure disorder-patient agreed and has not needed it.  10. Unsafe home environment-TOC team working on placement in ALF or group home  DVT prophylaxis: Lovenox Code Status: Full code Family / Patient Communication: none present at bedside. Will update Disposition Plan:   Status is:  Inpatient  Remains inpatient appropriate because:Unsafe d/c plan   Dispo: The patient is from: Home              Anticipated d/c is to: ALF              Anticipated d/c date is: 1 day              Patient currently is medically stable to d/c.    Time spent: 15 minutes     >50% time spent in discussions with care team and coordination of care.    Alessandra Bevels, MD [facility] Pager in [place]  If 7PM-7AM, please contact night-coverage [email or web address] 03/08/2020, 9:45 AM

## 2020-03-09 NOTE — Progress Notes (Signed)
Physical Therapy Treatment Patient Details Name: Jim Dunn MRN: [id number] DOB: 11-04-1965 Today's Date: 03/09/2020    History of Present Illness Pt is a 54 y/o male admitted secondary to hyponatremia and suicidal ideation. PMH includes CHF, COPD, seizures, and paranoid schizophrenia.     PT Comments    Pt progressing well towards physical therapy goals. Noted pt is medically ready for d/c and awaiting placement at assisted living. Pt reports he is very near baseline of function and he has met all acute PT goals except his stair goal. Will decrease frequency to keep checking on while admitted, however feel pt will likely meet all goals next session. Pt demonstrated the ability to move around the room well without an AD, however noted he did get mildly unsteady as he fatigued with distance during gait training. Will continue to follow and progress as able per POC.    Follow Up Recommendations  Home health PT     Equipment Recommendations  Rolling walker with 5" wheels    Recommendations for Other Services       Precautions / Restrictions Precautions Precautions: Fall Restrictions Weight Bearing Restrictions: No    Mobility  Bed Mobility Overal bed mobility: Modified Independent Bed Mobility: Supine to Sit           General bed mobility comments: Mildly increased time but overall pt was able to transition to EOB without difficulty. HOB flat.   Transfers Overall transfer level: Modified independent Equipment used: None Transfers: Sit to/from Stand           General transfer comment: Sit<>stand multiple times throughout session without SPC. Controlled descent noted and pt with good power-up to full stand.   Ambulation/Gait Ambulation/Gait assistance: Supervision;Min guard Gait Distance (Feet): 250 Feet Assistive device: Straight cane Gait Pattern/deviations: Step-through pattern Gait velocity: Decreased Gait velocity interpretation: <1.8 ft/sec, indicate of risk  for recurrent falls General Gait Details: Pt with good sequencing and smooth gait pattern with the SPC. As pt fatigued, he required a closer guard however was grossly at a supervision level. Pt reporting he is walking at baseline of function.    Stairs             Wheelchair Mobility    Modified Rankin (Stroke Patients Only)       Balance Overall balance assessment: Needs assistance Sitting-balance support: No upper extremity supported;Feet supported Sitting balance-Leahy Scale: Good     Standing balance support: No upper extremity supported;During functional activity Standing balance-Leahy Scale: Fair Standing balance comment: dynamically                            Cognition Arousal/Alertness: Awake/alert Behavior During Therapy: WFL for tasks assessed/performed Overall Cognitive Status: History of cognitive impairments - at baseline                                 General Comments: paranoid schizophrenia at baseline      Exercises      General Comments        Pertinent Vitals/Pain Pain Assessment: Faces Faces Pain Scale: Hurts a little bit Pain Location: Feet - pt reports his feet are numb and hurting him which is baseline.  Pain Descriptors / Indicators: Numbness Pain Intervention(s): Monitored during session    Home Living  Prior Function            PT Goals (current goals can now be found in the care plan section) Acute Rehab PT Goals Patient Stated Goal: to have somewhere to go  PT Goal Formulation: With patient Time For Goal Achievement: 03/10/20 Potential to Achieve Goals: Good Progress towards PT goals: Progressing toward goals    Frequency    Min 2X/week      PT Plan Frequency needs to be updated    Co-evaluation              AM-PAC PT "6 Clicks" Mobility   Outcome Measure  Help needed turning from your back to your side while in a flat bed without using bedrails?:  None Help needed moving from lying on your back to sitting on the side of a flat bed without using bedrails?: None Help needed moving to and from a bed to a chair (including a wheelchair)?: A Little Help needed standing up from a chair using your arms (e.g., wheelchair or bedside chair)?: A Little Help needed to walk in hospital room?: A Little Help needed climbing 3-5 steps with a railing? : A Little 6 Click Score: 20    End of Session Equipment Utilized During Treatment: Gait belt Activity Tolerance: Patient tolerated treatment well Patient left: with call bell/phone within reach;with nursing/sitter in room;in chair Nurse Communication: Mobility status PT Visit Diagnosis: Unsteadiness on feet (R26.81);Muscle weakness (generalized) (M62.81)     Time: 7096-4383 PT Time Calculation (min) (ACUTE ONLY): 16 min  Charges:  $Gait Training: 8-22 mins                     Jim Dunn, PT, DPT Acute Rehabilitation Services Pager: [phone] Office: [phone]    Jim Dunn 03/09/2020, 2:47 PM

## 2020-03-10 LAB — BASIC METABOLIC PANEL
Anion gap: 9 (ref 5–15)
BUN: 11 mg/dL (ref 6–20)
CO2: 25 mmol/L (ref 22–32)
Calcium: 9.2 mg/dL (ref 8.9–10.3)
Chloride: 98 mmol/L (ref 98–111)
Creatinine, Ser: 0.48 mg/dL — ABNORMAL LOW (ref 0.61–1.24)
GFR calc Af Amer: >60 mL/min (ref >60)
GFR calc non Af Amer: >60 mL/min (ref >60)
Glucose, Bld: 95 mg/dL (ref 70–99)
Potassium: 4.4 mmol/L (ref 3.5–5.1)
Sodium: 132 mmol/L — ABNORMAL LOW (ref 135–145)

## 2020-03-10 NOTE — Progress Notes (Addendum)
PROGRESS NOTE    Jim Dunn  YKD:[id number] DOB: 11/20/1965 DOA: 02/23/2020 PCP: System, Pcp Not In   Brief Narrative:  54 year old male with past medical history of schizophrenia, seizure disorder, biventricular heart failure(Echo 01/2020 EF 25-30%with right-sided heart failure),severe tricuspid regurgitation, nicotine dependence and COPD who presents to [facility] emergency department with suicidal ideation and seizure.Patient explains that since he left the hospital several days ago (admitted 6/17-6/19- was being managed for hyponatremia, thought to be multifactorial in origin. Patient left AMA on 6/19 and that time had sodium of 132), he has been experiencing increasing fatigue. This increasing fatigue is associated with bilateral lower extremity tingling. Symptoms were similar to what prompted his presentation on 6/17. Additionally, he reported feeling depressed with suicidal ideation, without a plan. Patient reported that he had attempted suicide in the distant past using pills but was unsuccessful and never sought medical attention. While the patient was waiting in the waiting room, he experienced a witnessed seizure while attempting to walk to the bathroom. Patient was immediately brought back to the emergency department for further evaluation. Patient was found to have substantial recurrent hyponatremia of 117 with concurrent hypokalemia of 2.6 and hypomagnesium of 1.4. Patient was administered 1236mg  of intravenous Fosphenytoinand initiated on low rate normal saline infusion. Patient was administered 2 g of intravenous magnesium and a 30 mEq of potassium.   Patient's electrolytes have improved, no further seizure episodes at this time.  Echo showed EF of 25-30%, cardiology recommended outpatient follow-up but otherwise continue Coreg, torsemide and Entresto.     Assessment & Plan:   Principal Problem:   Hyponatremia Active Problems:   Chronic systolic CHF (congestive  heart failure) (HCC)   Mixed hyperlipidemia   COPD (chronic obstructive pulmonary disease) (HCC)   Seizure disorder (HCC)   Paranoid schizophrenia (HCC)   Hypokalemia   Hypomagnesemia   Suicidal ideation   Noncompliance with medications   Subtherapeutic serum dilantin level   Seizure (HCC)  Hyponatremia with seizures:  Resolved with IV fluids, metaxalone has been discontinued.  Torsemide 20 mg daily resumed.  Depression, Suicidal ideations:  Resolved, seen by psychiatry.  Recommended to continue BuSpar, Neurontin, Invega.  Follow-up outpatient psychiatry.  Seizure disorder:  Secondary to hyperglycemia and issues with noncompliance.  Continue Dilantin 100 mg 3 times daily.  Microcytic anemia -Check iron studies  Congestive heart failure with reduced ejection fraction, 25%. Currently appears to be euvolemic.  Echo in May 2021-EF 25-30% Continue Coreg, torsemide 20 mg daily, Entresto.  COPD: stable.  As needed bronchodilators  Hyperlipidemia: Daily Lipitor 40 mg  Hypokalemia/hypomagnesemia Replace as needed  Prolonged QTC:  Appears to have resolved.  Currently on Seroquel 200 mg daily.  Neuropathy -Gabapentin 300 mg 3 times daily.  DVT prophylaxis: Lovenox Code Status: Full code Family Communication: None  Status is: Inpatient   Dispo: The patient is from: Home              Anticipated d/c is to: ALF              Anticipated d/c date is: 1 day              Patient currently is medically stable to d/c.  Currently awaiting placement, TOC team working on this.  Body mass index is 29.82 kg/m.   Subjective: No complaints, feels okay.  No suicidal homicidal ideation.  Review of Systems Otherwise negative except as per HPI, including: General: Denies fever, chills, night sweats or unintended weight loss. Resp: Denies  cough, wheezing, shortness of breath. Cardiac: Denies chest pain, palpitations, orthopnea, paroxysmal nocturnal dyspnea. GI: Denies  abdominal pain, nausea, vomiting, diarrhea or constipation GU: Denies dysuria, frequency, hesitancy or incontinence MS: Denies muscle aches, joint pain or swelling Neuro: Denies headache, neurologic deficits (focal weakness, numbness, tingling), abnormal gait Psych: Denies anxiety, depression, SI/HI/AVH Skin: Denies new rashes or lesions ID: Denies sick contacts, exotic exposures, travel  Examination:  General exam: Appears calm and comfortable  Respiratory system: Clear to auscultation. Respiratory effort normal. Cardiovascular system: S1 & S2 heard, RRR. No JVD, murmurs, rubs, gallops or clicks. No pedal edema. Gastrointestinal system: Abdomen is nondistended, soft and nontender. No organomegaly or masses felt. Normal bowel sounds heard. Central nervous system: Alert and oriented. No focal neurological deficits. Extremities: Symmetric 5 x 5 power. Skin: No rashes, lesions or ulcers Psychiatry: Judgement and insight appear normal. Mood & affect appropriate.  No suicidal or homicidal ideations   Objective: Vitals:   03/10/20 0418 03/10/20 0753 03/10/20 0900 03/10/20 1143  BP:  131/74  117/84  Pulse:  86  77  Resp:  18  18  Temp:  97.8 F (36.6 C)  98.4 F (36.9 C)  TempSrc:  Oral  Oral  SpO2:  100% 99% 100%  Weight: 89 kg      No intake or output data in the 24 hours ending 03/10/20 1205 Filed Weights   03/08/20 1619 03/09/20 0559 03/10/20 0418  Weight: 91.5 kg 88.3 kg 89 kg     Data Reviewed:   CBC: Recent Labs  Lab 03/04/20 0309 03/08/20 0701  WBC 7.8 8.9  HGB 11.0* 11.2*  HCT 34.6* 35.8*  MCV 63.8* 64.2*  PLT 188 195   Basic Metabolic Panel: Recent Labs  Lab 03/04/20 0309 03/07/20 0056 03/08/20 0701 03/10/20 0508  NA 131* 133* 133* 132*  K 3.7 3.8 4.1 4.4  CL 95* 99 97* 98  CO2 28 26 27 25   GLUCOSE 96 112* 95 95  BUN 13 15 9 11   CREATININE 0.54* 0.69 0.47* 0.48*  CALCIUM 9.0 8.9 9.3 9.2  MG 1.7  --   --   --   PHOS 5.2*  --   --   --     GFR: Estimated Creatinine Clearance: 117 mL/min (A) (by C-G formula based on SCr of 0.48 mg/dL (L)). Liver Function Tests: Recent Labs  Lab 03/04/20 0309  AST 39  ALT 34  ALKPHOS 198*  BILITOT 0.7  PROT 6.2*  ALBUMIN 3.2*   No results for input(s): LIPASE, AMYLASE in the last 168 hours. No results for input(s): AMMONIA in the last 168 hours. Coagulation Profile: No results for input(s): INR, PROTIME in the last 168 hours. Cardiac Enzymes: No results for input(s): CKTOTAL, CKMB, CKMBINDEX, TROPONINI in the last 168 hours. BNP (last 3 results) No results for input(s): PROBNP in the last 8760 hours. HbA1C: No results for input(s): HGBA1C in the last 72 hours. CBG: No results for input(s): GLUCAP in the last 168 hours. Lipid Profile: No results for input(s): CHOL, HDL, LDLCALC, TRIG, CHOLHDL, LDLDIRECT in the last 72 hours. Thyroid Function Tests: No results for input(s): TSH, T4TOTAL, FREET4, T3FREE, THYROIDAB in the last 72 hours. Anemia Panel: No results for input(s): VITAMINB12, FOLATE, FERRITIN, TIBC, IRON, RETICCTPCT in the last 72 hours. Sepsis Labs: No results for input(s): PROCALCITON, LATICACIDVEN in the last 168 hours.  No results found for this or any previous visit (from the past 240 hour(s)).       Radiology Studies:  No results found.      Scheduled Meds: . atorvastatin  40 mg Oral Daily  . busPIRone  10 mg Oral TID  . carvedilol  3.125 mg Oral BID WC  . digoxin  0.125 mg Oral Daily  . enoxaparin (LOVENOX) injection  40 mg Subcutaneous Daily  . fluticasone furoate-vilanterol  1 puff Inhalation Daily  . furosemide  20 mg Intravenous Once  . gabapentin  300 mg Oral TID  . melatonin  6 mg Oral QHS  . nicotine  21 mg Transdermal Daily  . phenytoin  100 mg Oral TID  . QUEtiapine  200 mg Oral Daily  . sacubitril-valsartan  1 tablet Oral BID  . torsemide  20 mg Oral Daily   Continuous Infusions:   LOS: 16 days   Time spent= 35  mins    Maleigha Colvard Joline Maxcy, MD [facility]  If 7PM-7AM, please contact night-coverage  03/10/2020, 12:05 PM

## 2020-03-11 LAB — CBC
HCT: 35.3 % — ABNORMAL LOW (ref 39.0–52.0)
Hemoglobin: 11 g/dL — ABNORMAL LOW (ref 13.0–17.0)
MCH: 20.1 pg — ABNORMAL LOW (ref 26.0–34.0)
MCHC: 31.2 g/dL (ref 30.0–36.0)
MCV: 64.4 fL — ABNORMAL LOW (ref 80.0–100.0)
Platelets: 217 10*3/uL (ref 150–400)
RBC: 5.48 MIL/uL (ref 4.22–5.81)
RDW: 26.5 % — ABNORMAL HIGH (ref 11.5–15.5)
WBC: 9.2 10*3/uL (ref 4.0–10.5)
nRBC: 0 % (ref 0.0–0.2)

## 2020-03-11 LAB — IRON AND TIBC
Iron: 107 ug/dL (ref 45–182)
Saturation Ratios: 28 % (ref 17.9–39.5)
TIBC: 385 ug/dL (ref 250–450)
UIBC: 278 ug/dL

## 2020-03-11 LAB — BASIC METABOLIC PANEL
Anion gap: 9 (ref 5–15)
BUN: 10 mg/dL (ref 6–20)
CO2: 24 mmol/L (ref 22–32)
Calcium: 9.6 mg/dL (ref 8.9–10.3)
Chloride: 96 mmol/L — ABNORMAL LOW (ref 98–111)
Creatinine, Ser: 0.53 mg/dL — ABNORMAL LOW (ref 0.61–1.24)
GFR calc Af Amer: >60 mL/min (ref >60)
GFR calc non Af Amer: >60 mL/min (ref >60)
Glucose, Bld: 102 mg/dL — ABNORMAL HIGH (ref 70–99)
Potassium: 4.4 mmol/L (ref 3.5–5.1)
Sodium: 129 mmol/L — ABNORMAL LOW (ref 135–145)

## 2020-03-11 LAB — FERRITIN: Ferritin: 229 ng/mL (ref 24–336)

## 2020-03-11 LAB — VITAMIN B12: Vitamin B-12: 442 pg/mL (ref 180–914)

## 2020-03-11 LAB — MAGNESIUM: Magnesium: 1.9 mg/dL (ref 1.7–2.4)

## 2020-03-11 MED ORDER — PALIPERIDONE PALMITATE ER 234 MG/1.5ML IM SUSY
234.0000 mg | PREFILLED_SYRINGE | Freq: Once | INTRAMUSCULAR | Status: AC
  Administered 2020-03-11: 234 mg via INTRAMUSCULAR
  Filled 2020-03-11 (×2): qty 1.5

## 2020-03-11 MED ORDER — SODIUM CHLORIDE 0.9 % IV SOLN: INTRAVENOUS | Status: AC

## 2020-03-11 NOTE — Progress Notes (Signed)
Occupational Therapy Treatment Patient Details Name: Jim Dunn MRN: [id number] DOB: April 26, 1966 Today's Date: 03/11/2020    History of present illness Pt is a 54 y/o male admitted secondary to hyponatremia and suicidal ideation. PMH includes CHF, COPD, seizures, and paranoid schizophrenia.    OT comments  Pt has met all acute OT goals. Reviewed fall prevention education with pt able to state 3 strategies. Back to baseline, mod I with basic ADLs. No further OT needs indicated. Pt awaiting placement into venue that can provide supervision which remains a good plan from OT standpoint. OT signing off.    Follow Up Recommendations    Supervision/Assistance for safety   Equipment Recommendations    none   Recommendations for Other Services      Precautions / Restrictions Precautions Precautions: Fall Restrictions Weight Bearing Restrictions: No       Mobility Bed Mobility Overal bed mobility: Modified Independent                Transfers Overall transfer level: Modified independent Equipment used: Straight cane             General transfer comment: cane at baseline    Balance Overall balance assessment: Mild deficits observed, not formally tested (cane at baseline)                                         ADL either performed or assessed with clinical judgement   ADL Overall ADL's : Needs assistance/impaired                                       General ADL Comments: supervision for safety with ADLs, asssit with IADLs     Vision       Perception     Praxis      Cognition Arousal/Alertness: Awake/alert Behavior During Therapy: WFL for tasks assessed/performed Overall Cognitive Status: History of cognitive impairments - at baseline                                 General Comments: paranoid schizophrenia at baseline        Exercises     Shoulder Instructions       General Comments       Pertinent Vitals/ Pain       Pain Assessment: Faces Faces Pain Scale: Hurts a little bit Pain Location: Feet - pt reports his feet are numb and hurting him which is baseline.  Pain Descriptors / Indicators: Numbness Pain Intervention(s): Monitored during session;Limited activity within patient's tolerance  Home Living                                          Prior Functioning/Environment              Frequency           Progress Toward Goals  OT Goals(current goals can now be found in the care plan section)  Progress towards OT goals: Goals met/education completed, patient discharged from OT  Acute Rehab OT Goals Patient Stated Goal: to have somewhere to go  OT Goal Formulation: With patient  Plan All goals met  and education completed, patient discharged from OT services    Co-evaluation                 AM-PAC OT "6 Clicks" Daily Activity     Outcome Measure   Help from another person eating meals?: None Help from another person taking care of personal grooming?: None Help from another person toileting, which includes using toliet, bedpan, or urinal?: None Help from another person bathing (including washing, rinsing, drying)?: A Little Help from another person to put on and taking off regular upper body clothing?: None Help from another person to put on and taking off regular lower body clothing?: None 6 Click Score: 23    End of Session Equipment Utilized During Treatment: Other (comment) (cane)      Activity Tolerance Patient tolerated treatment well   Patient Left in bed;with call bell/phone within reach   Nurse Communication          Time: 1316-1330 OT Time Calculation (min): 14 min  Charges: OT General Charges $OT Visit: 1 Visit OT Treatments $Self Care/Home Management : 8-22 mins  Tyrone Schimke, OT Acute Rehabilitation Services Pager: [phone] Office: [phone]    Hortencia Pilar 03/11/2020,  2:33 PM

## 2020-03-11 NOTE — Progress Notes (Signed)
Pharmacy - > Invega  Invega 234 mg  IM shot given 7/8  Thank you Okey Regal, PharmD

## 2020-03-11 NOTE — Progress Notes (Addendum)
PROGRESS NOTE    Jim Dunn  SWH:[id number] DOB: 12/15/1965 DOA: 02/23/2020 PCP: System, Pcp Not In   Brief Narrative:  54 year old male with past medical history of schizophrenia, seizure disorder, biventricular heart failure(Echo 01/2020 EF 25-30%with right-sided heart failure),severe tricuspid regurgitation, nicotine dependence and COPD who presents to [facility] emergency department with suicidal ideation and seizure.Patient explains that since he left the hospital several days ago (admitted 6/17-6/19- was being managed for hyponatremia, thought to be multifactorial in origin. Patient left AMA on 6/19 and that time had sodium of 132), he has been experiencing increasing fatigue. This increasing fatigue is associated with bilateral lower extremity tingling. Symptoms were similar to what prompted his presentation on 6/17. Additionally, he reported feeling depressed with suicidal ideation, without a plan. Patient reported that he had attempted suicide in the distant past using pills but was unsuccessful and never sought medical attention. While the patient was waiting in the waiting room, he experienced a witnessed seizure while attempting to walk to the bathroom. Patient was immediately brought back to the emergency department for further evaluation. Patient was found to have substantial recurrent hyponatremia of 117 with concurrent hypokalemia of 2.6 and hypomagnesium of 1.4. Patient was administered 1236mg  of intravenous Fosphenytoinand initiated on low rate normal saline infusion. Patient was administered 2 g of intravenous magnesium and a 30 mEq of potassium.   Patient's electrolytes have improved, no further seizure episodes at this time.  Echo showed EF of 25-30%, cardiology recommended outpatient follow-up but otherwise continue Coreg, torsemide and Entresto.     Assessment & Plan:   Principal Problem:   Hyponatremia Active Problems:   Chronic systolic CHF (congestive  heart failure) (HCC)   Mixed hyperlipidemia   COPD (chronic obstructive pulmonary disease) (HCC)   Seizure disorder (HCC)   Paranoid schizophrenia (HCC)   Hypokalemia   Hypomagnesemia   Suicidal ideation   Noncompliance with medications   Subtherapeutic serum dilantin level   Seizure (HCC)  Hyponatremia with seizures:  Intravascular dehydration Due to mild dehydration today we will hold off on torsemide.  Metolazone has been discontinued Normal saline 75 cc/h X 1 day  Depression, Suicidal ideations:  Resolved, seen by psychiatry.  Recommended to continue BuSpar, Neurontin, Invega.  Follow-up outpatient psychiatry. Shot of Invega due today, pharmacy consulted  Seizure disorder:  Secondary to hyperglycemia and issues with noncompliance.  Continue Dilantin 100 mg 3 times daily.  Microcytic anemia -Iron studies relatively normal, hemoglobin stable  Congestive heart failure with reduced ejection fraction, 25%. Currently appears to be euvolemic.  Echo in May 2021-EF 25-30% Continue Coreg, Entresto.  Torsemide on hold  COPD: stable.  As needed bronchodilators  Hyperlipidemia: Daily Lipitor 40 mg  Hypokalemia/hypomagnesemia Replace as needed  Prolonged QTC:  Appears to have resolved.  Currently on Seroquel 200 mg daily.  Neuropathy -Gabapentin 300 mg 3 times daily.  DVT prophylaxis: Lovenox Code Status: Full code Family Communication: Updated mother01-19-1999.   Status is: Inpatient   Dispo: The patient is from: Home              Anticipated d/c is to: ALF              Anticipated d/c date is: 1 day              Patient currently is medically stable to d/c.  Currently awaiting placement, TOC team working on this.  Body mass index is 30.36 kg/m.   Subjective: No complaints feels okay.  No acute events  overnight.   Review of Systems Otherwise negative except as per HPI, including: General: Denies fever, chills, night sweats or unintended weight  loss. Resp: Denies cough, wheezing, shortness of breath. Cardiac: Denies chest pain, palpitations, orthopnea, paroxysmal nocturnal dyspnea. GI: Denies abdominal pain, nausea, vomiting, diarrhea or constipation GU: Denies dysuria, frequency, hesitancy or incontinence MS: Denies muscle aches, joint pain or swelling Neuro: Denies headache, neurologic deficits (focal weakness, numbness, tingling), abnormal gait Psych: Denies anxiety, depression, SI/HI/AVH Skin: Denies new rashes or lesions ID: Denies sick contacts, exotic exposures, travel  Examination:  Constitutional: Not in acute distress Respiratory: Clear to auscultation bilaterally Cardiovascular: Normal sinus rhythm, no rubs Abdomen: Nontender nondistended good bowel sounds Musculoskeletal: No edema noted Skin: No rashes seen Neurologic: CN 2-12 grossly intact.  And nonfocal Psychiatric: Normal judgment and insight. Alert and oriented x 3. Normal mood.  Objective: Vitals:   03/10/20 1924 03/10/20 2347 03/11/20 0339 03/11/20 0838  BP: 111/72 (!) 140/96 107/70 131/79  Pulse: 94 83 95 96  Resp: 18 18 18 18   Temp: 97.8 F (36.6 C) 97.9 F (36.6 C) 98.8 F (37.1 C) 98.6 F (37 C)  TempSrc: Oral Oral Oral Oral  SpO2: 96% 98% 99% 100%  Weight:   90.6 kg     Intake/Output Summary (Last 24 hours) at 03/11/2020 1129 Last data filed at 03/11/2020 0930 Gross per 24 hour  Intake 480 ml  Output --  Net 480 ml   Filed Weights   03/09/20 0559 03/10/20 0418 03/11/20 0339  Weight: 88.3 kg 89 kg 90.6 kg     Data Reviewed:   CBC: Recent Labs  Lab 03/08/20 0701 03/11/20 0433  WBC 8.9 9.2  HGB 11.2* 11.0*  HCT 35.8* 35.3*  MCV 64.2* 64.4*  PLT 195 217   Basic Metabolic Panel: Recent Labs  Lab 03/07/20 0056 03/08/20 0701 03/10/20 0508 03/11/20 0433  NA 133* 133* 132* 129*  K 3.8 4.1 4.4 4.4  CL 99 97* 98 96*  CO2 26 27 25 24   GLUCOSE 112* 95 95 102*  BUN 15 9 11 10   CREATININE 0.69 0.47* 0.48* 0.53*  CALCIUM 8.9  9.3 9.2 9.6  MG  --   --   --  1.9   GFR: Estimated Creatinine Clearance: 118.1 mL/min (A) (by C-G formula based on SCr of 0.53 mg/dL (L)). Liver Function Tests: No results for input(s): AST, ALT, ALKPHOS, BILITOT, PROT, ALBUMIN in the last 168 hours. No results for input(s): LIPASE, AMYLASE in the last 168 hours. No results for input(s): AMMONIA in the last 168 hours. Coagulation Profile: No results for input(s): INR, PROTIME in the last 168 hours. Cardiac Enzymes: No results for input(s): CKTOTAL, CKMB, CKMBINDEX, TROPONINI in the last 168 hours. BNP (last 3 results) No results for input(s): PROBNP in the last 8760 hours. HbA1C: No results for input(s): HGBA1C in the last 72 hours. CBG: No results for input(s): GLUCAP in the last 168 hours. Lipid Profile: No results for input(s): CHOL, HDL, LDLCALC, TRIG, CHOLHDL, LDLDIRECT in the last 72 hours. Thyroid Function Tests: No results for input(s): TSH, T4TOTAL, FREET4, T3FREE, THYROIDAB in the last 72 hours. Anemia Panel: Recent Labs    03/11/20 0433  VITAMINB12 442  FERRITIN 229  TIBC 385  IRON 107   Sepsis Labs: No results for input(s): PROCALCITON, LATICACIDVEN in the last 168 hours.  No results found for this or any previous visit (from the past 240 hour(s)).       Radiology Studies: No results found.  Scheduled Meds: . atorvastatin  40 mg Oral Daily  . busPIRone  10 mg Oral TID  . carvedilol  3.125 mg Oral BID WC  . digoxin  0.125 mg Oral Daily  . enoxaparin (LOVENOX) injection  40 mg Subcutaneous Daily  . fluticasone furoate-vilanterol  1 puff Inhalation Daily  . furosemide  20 mg Intravenous Once  . gabapentin  300 mg Oral TID  . melatonin  6 mg Oral QHS  . nicotine  21 mg Transdermal Daily  . phenytoin  100 mg Oral TID  . QUEtiapine  200 mg Oral Daily  . sacubitril-valsartan  1 tablet Oral BID   Continuous Infusions: . sodium chloride 75 mL/hr at 03/11/20 1027     LOS: 17 days   Time  spent= 35 mins    Treonna Klee Joline Maxcy, MD [facility]  If 7PM-7AM, please contact night-coverage  03/11/2020, 11:29 AM

## 2020-03-12 LAB — FOLATE RBC
Folate, Hemolysate: 360 ng/mL
Folate, RBC: 1000 ng/mL (ref >498)
Hematocrit: 36 % — ABNORMAL LOW (ref 37.5–51.0)

## 2020-03-12 NOTE — Progress Notes (Signed)
Physical Therapy Treatment and Discharge Patient Details Name: Jim Dunn MRN: [id number] DOB: [birth year] Today's Date: 03/12/2020    History of Present Illness Pt is a 54 y/o male admitted secondary to hyponatremia and suicidal ideation. PMH includes CHF, COPD, seizures, and paranoid schizophrenia.     PT Comments    Pt progressing well with mobility and reports he is at his baseline of function. Nursing staff reports that the pt has been ambulating around his room and the unit with the [facility] and no assistance. MD wrote orders allowing patient off unit today and the patient was able to follow multi-step directions to navigate his way outside and back to the unit with min cues for path finding. Pt reports he does not wish to have home health services follow-up at d/c, and feel this is reasonable if he is at his functional baseline. Pt has met acute PT goals and we will sign off at this time. If needs change, please reconsult.    Follow Up Recommendations  No PT follow up     Equipment Recommendations  Cane    Recommendations for Other Services       Precautions / Restrictions Precautions Precautions: Fall Restrictions Weight Bearing Restrictions: No    Mobility  Bed Mobility Overal bed mobility: Modified Independent Bed Mobility: Supine to Sit           General bed mobility comments: Pt received essentially in supine laid back in the recliner. He was able to transition from supine to standing very quickly without assistance.  Transfers Overall transfer level: Modified independent Equipment used: Straight cane Transfers: Sit to/from Stand           General transfer comment: cane at baseline; no assist required.   Ambulation/Gait Ambulation/Gait assistance: Modified independent (Device/Increase time) Gait Distance (Feet): 500 Feet (x2) Assistive device: Straight cane Gait Pattern/deviations: Step-through pattern;Decreased stride length Gait velocity: Decreased Gait  velocity interpretation: 1.31 - 2.62 ft/sec, indicative of limited community ambulator General Gait Details: Pt with good sequencing and smooth gait pattern with the SPC. Pt reporting he is walking at his baseline of function.    Stairs Stairs: Yes Stairs assistance: Min guard Stair Management: Alternating pattern;Forwards;One rail Left;With cane Number of Stairs: 5 General stair comments: No assist. Pt quickly and easily negotiated stairs without unsteadiness or LOB noted.    Wheelchair Mobility    Modified Rankin (Stroke Patients Only)       Balance Overall balance assessment: Mild deficits observed, not formally tested (cane at baseline) Sitting-balance support: No upper extremity supported;Feet supported Sitting balance-Leahy Scale: Good Sitting balance - Comments: able to reach to BLEs to don shoes with no LOB   Standing balance support: No upper extremity supported;During functional activity Standing balance-Leahy Scale: Fair Standing balance comment: dynamically                            Cognition Arousal/Alertness: Awake/alert Behavior During Therapy: WFL for tasks assessed/performed Overall Cognitive Status: History of cognitive impairments - at baseline                                 General Comments: paranoid schizophrenia at baseline      Exercises      General Comments        Pertinent Vitals/Pain Pain Assessment: No/denies pain    Home Living  Prior Function            PT Goals (current goals can now be found in the care plan section) Acute Rehab PT Goals Patient Stated Goal: to have somewhere to go  PT Goal Formulation: With patient Time For Goal Achievement: 03/10/20 Potential to Achieve Goals: Good Progress towards PT goals: Goals met/education completed, patient discharged from PT    Frequency    Min 2X/week      PT Plan Discharge plan needs to be updated     Co-evaluation              AM-PAC PT "6 Clicks" Mobility   Outcome Measure  Help needed turning from your back to your side while in a flat bed without using bedrails?: None Help needed moving from lying on your back to sitting on the side of a flat bed without using bedrails?: None Help needed moving to and from a bed to a chair (including a wheelchair)?: A Little Help needed standing up from a chair using your arms (e.g., wheelchair or bedside chair)?: A Little Help needed to walk in hospital room?: A Little Help needed climbing 3-5 steps with a railing? : A Little 6 Click Score: 20    End of Session Equipment Utilized During Treatment: Gait belt Activity Tolerance: Patient tolerated treatment well Patient left: with call bell/phone within reach;with nursing/sitter in room;in chair Nurse Communication: Mobility status PT Visit Diagnosis: Unsteadiness on feet (R26.81);Muscle weakness (generalized) (M62.81)     Time: 1330-1400 PT Time Calculation (min) (ACUTE ONLY): 30 min  Charges:  $Gait Training: 8-22 mins $Therapeutic Activity: 8-22 mins                     [name], PT, DPT Acute Rehabilitation Services Pager: [phone] Office: [phone]    [name] 03/12/2020, 2:55 PM

## 2020-03-12 NOTE — Progress Notes (Signed)
PROGRESS NOTE    Jim Dunn  DJS:[id number] DOB: 1966/09/03 DOA: 02/23/2020 PCP: System, Pcp Not In   Brief Narrative:  54 year old male with past medical history of schizophrenia, seizure disorder, biventricular heart failure(Echo 01/2020 EF 25-30%with right-sided heart failure),severe tricuspid regurgitation, nicotine dependence and COPD who presents to [facility] emergency department with suicidal ideation and seizure.Patient explains that since he left the hospital several days ago (admitted 6/17-6/19- was being managed for hyponatremia, thought to be multifactorial in origin. Patient left AMA on 6/19 and that time had sodium of 132), he has been experiencing increasing fatigue. This increasing fatigue is associated with bilateral lower extremity tingling. Symptoms were similar to what prompted his presentation on 6/17. Additionally, he reported feeling depressed with suicidal ideation, without a plan. Patient reported that he had attempted suicide in the distant past using pills but was unsuccessful and never sought medical attention. While the patient was waiting in the waiting room, he experienced a witnessed seizure while attempting to walk to the bathroom. Patient was immediately brought back to the emergency department for further evaluation. Patient was found to have substantial recurrent hyponatremia of 117 with concurrent hypokalemia of 2.6 and hypomagnesium of 1.4. Patient was administered 1236mg  of intravenous Fosphenytoinand initiated on low rate normal saline infusion. Patient was administered 2 g of intravenous magnesium and a 30 mEq of potassium.   Patient's electrolytes have improved, no further seizure episodes at this time.  Echo showed EF of 25-30%, cardiology recommended outpatient follow-up but otherwise continue Coreg, torsemide and Entresto.     Assessment & Plan:   Principal Problem:   Hyponatremia Active Problems:   Chronic systolic CHF (congestive  heart failure) (HCC)   Mixed hyperlipidemia   COPD (chronic obstructive pulmonary disease) (HCC)   Seizure disorder (HCC)   Paranoid schizophrenia (HCC)   Hypokalemia   Hypomagnesemia   Suicidal ideation   Noncompliance with medications   Subtherapeutic serum dilantin level   Seizure (HCC)  Hyponatremia with seizures:  Intravascular dehydration Holding off on diuretics-torsemide, metaxalone discontinued A.m. labs pending. Normal saline 75 cc/h X 1 day  Depression, Suicidal ideations:  Resolved, seen by psychiatry.  Recommended to continue BuSpar, Neurontin, Invega.  Follow-up outpatient psychiatry. Shot of Invega due today, pharmacy consulted  Seizure disorder:  Secondary to hyperglycemia and issues with noncompliance.  Continue Dilantin 100 mg 3 times daily.  Microcytic anemia -Iron studies relatively normal, hemoglobin stable  Congestive heart failure with reduced ejection fraction, 25%. Currently appears to be euvolemic.  Echo in May 2021-EF 25-30% Continue Coreg, Entresto.  Torsemide on hold  COPD: stable.  As needed bronchodilators  Hyperlipidemia: Daily Lipitor 40 mg  Hypokalemia/hypomagnesemia Replace as needed  Prolonged QTC:  Appears to have resolved.  Currently on Seroquel 200 mg daily.  Neuropathy -Gabapentin 300 mg 3 times daily.  DVT prophylaxis: Lovenox Code Status: Full code Family Communication: Updated mother- 09-22-1997 yesterday  Status is: Inpatient   Dispo: The patient is from: Home              Anticipated d/c is to: ALF              Anticipated d/c date is: 1 day              Patient currently is medically stable to d/c.  Currently awaiting placement, TOC team working on this.  Mother is currently working to obtain custodial/guardianship.  Unsafe for discharge  Body mass index is 29.44 kg/m.   Subjective: Feels great no  complaints   Review of Systems Otherwise negative except as per HPI, including: General: Denies fever,  chills, night sweats or unintended weight loss. Resp: Denies cough, wheezing, shortness of breath. Cardiac: Denies chest pain, palpitations, orthopnea, paroxysmal nocturnal dyspnea. GI: Denies abdominal pain, nausea, vomiting, diarrhea or constipation GU: Denies dysuria, frequency, hesitancy or incontinence MS: Denies muscle aches, joint pain or swelling Neuro: Denies headache, neurologic deficits (focal weakness, numbness, tingling), abnormal gait Psych: Denies anxiety, depression, SI/HI/AVH Skin: Denies new rashes or lesions ID: Denies sick contacts, exotic exposures, travel  Examination:  Constitutional: Not in acute distress Respiratory: Clear to auscultation bilaterally Cardiovascular: Normal sinus rhythm, no rubs Abdomen: Nontender nondistended good bowel sounds Musculoskeletal: No edema noted Skin: No rashes seen Neurologic: CN 2-12 grossly intact.  And nonfocal Psychiatric: Normal judgment and insight. Alert and oriented x 3. Normal mood.  Objective: Vitals:   03/12/20 0330 03/12/20 0500 03/12/20 0842 03/12/20 0910  BP: (!) 129/96   139/81  Pulse: 100  98 80  Resp: 16  16 16   Temp: 98.6 F (37 C)   98.2 F (36.8 C)  TempSrc: Oral   Oral  SpO2: 99%  98% 99%  Weight:  87.8 kg      Intake/Output Summary (Last 24 hours) at 03/12/2020 1048 Last data filed at 03/12/2020 0534 Gross per 24 hour  Intake 2440.65 ml  Output 3460 ml  Net -1019.35 ml   Filed Weights   03/10/20 0418 03/11/20 0339 03/12/20 0500  Weight: 89 kg 90.6 kg 87.8 kg     Data Reviewed:   CBC: Recent Labs  Lab 03/08/20 0701 03/11/20 0433  WBC 8.9 9.2  HGB 11.2* 11.0*  HCT 35.8* 35.3*  MCV 64.2* 64.4*  PLT 195 217   Basic Metabolic Panel: Recent Labs  Lab 03/07/20 0056 03/08/20 0701 03/10/20 0508 03/11/20 0433  NA 133* 133* 132* 129*  K 3.8 4.1 4.4 4.4  CL 99 97* 98 96*  CO2 26 27 25 24   GLUCOSE 112* 95 95 102*  BUN 15 9 11 10   CREATININE 0.69 0.47* 0.48* 0.53*  CALCIUM 8.9 9.3  9.2 9.6  MG  --   --   --  1.9   GFR: Estimated Creatinine Clearance: 116.4 mL/min (A) (by C-G formula based on SCr of 0.53 mg/dL (L)). Liver Function Tests: No results for input(s): AST, ALT, ALKPHOS, BILITOT, PROT, ALBUMIN in the last 168 hours. No results for input(s): LIPASE, AMYLASE in the last 168 hours. No results for input(s): AMMONIA in the last 168 hours. Coagulation Profile: No results for input(s): INR, PROTIME in the last 168 hours. Cardiac Enzymes: No results for input(s): CKTOTAL, CKMB, CKMBINDEX, TROPONINI in the last 168 hours. BNP (last 3 results) No results for input(s): PROBNP in the last 8760 hours. HbA1C: No results for input(s): HGBA1C in the last 72 hours. CBG: No results for input(s): GLUCAP in the last 168 hours. Lipid Profile: No results for input(s): CHOL, HDL, LDLCALC, TRIG, CHOLHDL, LDLDIRECT in the last 72 hours. Thyroid Function Tests: No results for input(s): TSH, T4TOTAL, FREET4, T3FREE, THYROIDAB in the last 72 hours. Anemia Panel: Recent Labs    03/11/20 0433  VITAMINB12 442  FERRITIN 229  TIBC 385  IRON 107   Sepsis Labs: No results for input(s): PROCALCITON, LATICACIDVEN in the last 168 hours.  No results found for this or any previous visit (from the past 240 hour(s)).       Radiology Studies: No results found.  Scheduled Meds: . atorvastatin  40 mg Oral Daily  . busPIRone  10 mg Oral TID  . carvedilol  3.125 mg Oral BID WC  . digoxin  0.125 mg Oral Daily  . enoxaparin (LOVENOX) injection  40 mg Subcutaneous Daily  . fluticasone furoate-vilanterol  1 puff Inhalation Daily  . furosemide  20 mg Intravenous Once  . gabapentin  300 mg Oral TID  . melatonin  6 mg Oral QHS  . nicotine  21 mg Transdermal Daily  . phenytoin  100 mg Oral TID  . QUEtiapine  200 mg Oral Daily  . sacubitril-valsartan  1 tablet Oral BID   Continuous Infusions:    LOS: 18 days   Time spent= 25 mins    Rayvn Rickerson Joline Maxcy, MD [facility]  If 7PM-7AM, please contact night-coverage  03/12/2020, 10:48 AM

## 2020-03-13 NOTE — Progress Notes (Signed)
PROGRESS NOTE    Jim Dunn  KZL:[id number] DOB: Jan 08, 1966 DOA: 02/23/2020 PCP: System, Pcp Not In   Brief Narrative:  54 year old male with past medical history of schizophrenia, seizure disorder, biventricular heart failure(Echo 01/2020 EF 25-30%with right-sided heart failure),severe tricuspid regurgitation, nicotine dependence and COPD who presents to [facility] emergency department with suicidal ideation and seizure.Patient explains that since he left the hospital several days ago (admitted 6/17-6/19- was being managed for hyponatremia, thought to be multifactorial in origin. Patient left AMA on 6/19 and that time had sodium of 132), he has been experiencing increasing fatigue. This increasing fatigue is associated with bilateral lower extremity tingling. Symptoms were similar to what prompted his presentation on 6/17. Additionally, he reported feeling depressed with suicidal ideation, without a plan. Patient reported that he had attempted suicide in the distant past using pills but was unsuccessful and never sought medical attention. While the patient was waiting in the waiting room, he experienced a witnessed seizure while attempting to walk to the bathroom. Patient was immediately brought back to the emergency department for further evaluation. Patient was found to have substantial recurrent hyponatremia of 117 with concurrent hypokalemia of 2.6 and hypomagnesium of 1.4. Patient was administered 1236mg  of intravenous Fosphenytoinand initiated on low rate normal saline infusion. Patient was administered 2 g of intravenous magnesium and a 30 mEq of potassium.   Patient's electrolytes have improved, no further seizure episodes at this time.  Echo showed EF of 25-30%, cardiology recommended outpatient follow-up but otherwise continue Coreg, torsemide and Entresto.     Assessment & Plan:   Principal Problem:   Hyponatremia Active Problems:   Chronic systolic CHF (congestive  heart failure) (HCC)   Mixed hyperlipidemia   COPD (chronic obstructive pulmonary disease) (HCC)   Seizure disorder (HCC)   Paranoid schizophrenia (HCC)   Hypokalemia   Hypomagnesemia   Suicidal ideation   Noncompliance with medications   Subtherapeutic serum dilantin level   Seizure (HCC)  Hyponatremia with seizures:  Intravascular dehydration, improving Holding off on diuretics torsemide, metolazone discontinued Advised oral hydration Will resume oral diuretics when appropriate  Depression, Suicidal ideations:  Resolved, seen by psychiatry.  Recommended to continue BuSpar, Neurontin, Invega.  Follow-up outpatient psychiatry.  Received his routine Invega shot 7/8  Seizure disorder:  Secondary to hyperglycemia and issues with noncompliance.  Continue Dilantin 100 mg 3 times daily.  Microcytic anemia -Iron studies relatively normal, hemoglobin stable  Congestive heart failure with reduced ejection fraction, 25%. Currently appears to be euvolemic.  Echo in May 2021-EF 25-30% Continue Coreg, Entresto.  Torsemide on hold  COPD: stable.  As needed bronchodilators  Hyperlipidemia: Daily Lipitor 40 mg  Hypokalemia/hypomagnesemia Replace as needed  Prolonged QTC:  Appears to have resolved.  Currently on Seroquel 200 mg daily.  Neuropathy -Gabapentin 300 mg 3 times daily.  Okay to leave IV line out at patient's request, no acute issues ongoing at this time.  DVT prophylaxis: Lovenox Code Status: Full code Family Communication: None at bedside, mother has been updated periodically  Status is: Inpatient   Dispo: The patient is from: Home              Anticipated d/c is to: ALF              Anticipated d/c date is: 1 day              Patient currently is medically stable to d/c.  Currently awaiting placement, TOC team working on this.  Mother is  currently working to obtain custodial/guardianship.  Unsafe for discharge  Body mass index is 29.97 kg/m.    Subjective: Feels okay no complaints  Review of Systems Otherwise negative except as per HPI, including: General: Denies fever, chills, night sweats or unintended weight loss. Resp: Denies cough, wheezing, shortness of breath. Cardiac: Denies chest pain, palpitations, orthopnea, paroxysmal nocturnal dyspnea. GI: Denies abdominal pain, nausea, vomiting, diarrhea or constipation GU: Denies dysuria, frequency, hesitancy or incontinence MS: Denies muscle aches, joint pain or swelling Neuro: Denies headache, neurologic deficits (focal weakness, numbness, tingling), abnormal gait Psych: Denies anxiety, depression, SI/HI/AVH Skin: Denies new rashes or lesions ID: Denies sick contacts, exotic exposures, travel  Examination:  Constitutional: Not in acute distress Respiratory: Clear to auscultation bilaterally Cardiovascular: Normal sinus rhythm, no rubs Abdomen: Nontender nondistended good bowel sounds Musculoskeletal: No edema noted Skin: No rashes seen Neurologic: CN 2-12 grossly intact.  And nonfocal Psychiatric: Normal judgment and insight. Alert and oriented x 3. Normal mood. Objective: Vitals:   03/12/20 1900 03/12/20 2300 03/13/20 0300 03/13/20 0915  BP: 121/75 124/77 132/71 (!) 143/85  Pulse: 100 90 84 88  Resp: 18 17 19 20   Temp: 98.1 F (36.7 C) 97.9 F (36.6 C) 98.2 F (36.8 C) 97.9 F (36.6 C)  TempSrc: Oral Oral Oral Oral  SpO2:  98% 100% 98%  Weight:   89.4 kg     Intake/Output Summary (Last 24 hours) at 03/13/2020 1038 Last data filed at 03/13/2020 0908 Gross per 24 hour  Intake 270 ml  Output 500 ml  Net -230 ml   Filed Weights   03/11/20 0339 03/12/20 0500 03/13/20 0300  Weight: 90.6 kg 87.8 kg 89.4 kg     Data Reviewed:   CBC: Recent Labs  Lab 03/08/20 0701 03/11/20 0433  WBC 8.9 9.2  HGB 11.2* 11.0*  HCT 35.8* 35.3*  36.0*  MCV 64.2* 64.4*  PLT 195 217   Basic Metabolic Panel: Recent Labs  Lab 03/07/20 0056 03/08/20 0701 03/10/20 0508  03/11/20 0433  NA 133* 133* 132* 129*  K 3.8 4.1 4.4 4.4  CL 99 97* 98 96*  CO2 26 27 25 24   GLUCOSE 112* 95 95 102*  BUN 15 9 11 10   CREATININE 0.69 0.47* 0.48* 0.53*  CALCIUM 8.9 9.3 9.2 9.6  MG  --   --   --  1.9   GFR: Estimated Creatinine Clearance: 117.3 mL/min (A) (by C-G formula based on SCr of 0.53 mg/dL (L)). Liver Function Tests: No results for input(s): AST, ALT, ALKPHOS, BILITOT, PROT, ALBUMIN in the last 168 hours. No results for input(s): LIPASE, AMYLASE in the last 168 hours. No results for input(s): AMMONIA in the last 168 hours. Coagulation Profile: No results for input(s): INR, PROTIME in the last 168 hours. Cardiac Enzymes: No results for input(s): CKTOTAL, CKMB, CKMBINDEX, TROPONINI in the last 168 hours. BNP (last 3 results) No results for input(s): PROBNP in the last 8760 hours. HbA1C: No results for input(s): HGBA1C in the last 72 hours. CBG: No results for input(s): GLUCAP in the last 168 hours. Lipid Profile: No results for input(s): CHOL, HDL, LDLCALC, TRIG, CHOLHDL, LDLDIRECT in the last 72 hours. Thyroid Function Tests: No results for input(s): TSH, T4TOTAL, FREET4, T3FREE, THYROIDAB in the last 72 hours. Anemia Panel: Recent Labs    03/11/20 0433  VITAMINB12 442  FERRITIN 229  TIBC 385  IRON 107   Sepsis Labs: No results for input(s): PROCALCITON, LATICACIDVEN in the last 168 hours.  No results  found for this or any previous visit (from the past 240 hour(s)).       Radiology Studies: No results found.      Scheduled Meds: . atorvastatin  40 mg Oral Daily  . busPIRone  10 mg Oral TID  . carvedilol  3.125 mg Oral BID WC  . digoxin  0.125 mg Oral Daily  . enoxaparin (LOVENOX) injection  40 mg Subcutaneous Daily  . fluticasone furoate-vilanterol  1 puff Inhalation Daily  . furosemide  20 mg Intravenous Once  . gabapentin  300 mg Oral TID  . melatonin  6 mg Oral QHS  . nicotine  21 mg Transdermal Daily  . phenytoin  100 mg  Oral TID  . QUEtiapine  200 mg Oral Daily  . sacubitril-valsartan  1 tablet Oral BID   Continuous Infusions:    LOS: 19 days   Time spent= 25 mins    Dyer Klug Joline Maxcy, MD [facility]  If 7PM-7AM, please contact night-coverage  03/13/2020, 10:38 AM

## 2020-03-14 LAB — BASIC METABOLIC PANEL
Anion gap: 8 (ref 5–15)
BUN: 10 mg/dL (ref 6–20)
CO2: 24 mmol/L (ref 22–32)
Calcium: 9.2 mg/dL (ref 8.9–10.3)
Chloride: 98 mmol/L (ref 98–111)
Creatinine, Ser: 0.48 mg/dL — ABNORMAL LOW (ref 0.61–1.24)
GFR calc Af Amer: >60 mL/min (ref >60)
GFR calc non Af Amer: >60 mL/min (ref >60)
Glucose, Bld: 96 mg/dL (ref 70–99)
Potassium: 4.4 mmol/L (ref 3.5–5.1)
Sodium: 130 mmol/L — ABNORMAL LOW (ref 135–145)

## 2020-03-14 LAB — MAGNESIUM: Magnesium: 1.8 mg/dL (ref 1.7–2.4)

## 2020-03-14 NOTE — Progress Notes (Signed)
PROGRESS NOTE    Jim Dunn  FXT:[id number] DOB: 07/27/1966 DOA: 02/23/2020 PCP: System, Pcp Not In   Brief Narrative:  54 year old male with past medical history of schizophrenia, seizure disorder, biventricular heart failure(Echo 01/2020 EF 25-30%with right-sided heart failure),severe tricuspid regurgitation, nicotine dependence and COPD who presents to [facility] emergency department with suicidal ideation and seizure.Patient explains that since he left the hospital several days ago (admitted 6/17-6/19- was being managed for hyponatremia, thought to be multifactorial in origin. Patient left AMA on 6/19 and that time had sodium of 132), he has been experiencing increasing fatigue. This increasing fatigue is associated with bilateral lower extremity tingling. Symptoms were similar to what prompted his presentation on 6/17. Additionally, he reported feeling depressed with suicidal ideation, without a plan. Patient reported that he had attempted suicide in the distant past using pills but was unsuccessful and never sought medical attention. While the patient was waiting in the waiting room, he experienced a witnessed seizure while attempting to walk to the bathroom. Patient was immediately brought back to the emergency department for further evaluation. Patient was found to have substantial recurrent hyponatremia of 117 with concurrent hypokalemia of 2.6 and hypomagnesium of 1.4. Patient was administered 1236mg  of intravenous Fosphenytoinand initiated on low rate normal saline infusion. Patient was administered 2 g of intravenous magnesium and a 30 mEq of potassium.   Patient's electrolytes have improved, no further seizure episodes at this time.  Echo showed EF of 25-30%, cardiology recommended outpatient follow-up but otherwise continue Coreg, torsemide and Entresto.     Assessment & Plan:   Principal Problem:   Hyponatremia Active Problems:   Chronic systolic CHF (congestive  heart failure) (HCC)   Mixed hyperlipidemia   COPD (chronic obstructive pulmonary disease) (HCC)   Seizure disorder (HCC)   Paranoid schizophrenia (HCC)   Hypokalemia   Hypomagnesemia   Suicidal ideation   Noncompliance with medications   Subtherapeutic serum dilantin level   Seizure (HCC)  Hyponatremia with seizures:  Intravascular dehydration, improving Holding off on diuretics torsemide, metolazone discontinued Continue oral hydration Will resume oral diuretics when appropriate, lungs are clear therefore hold off on diuretics for now.  Depression, Suicidal ideations:  Resolved, seen by psychiatry.  Recommended to continue BuSpar, Neurontin, Invega.  Follow-up outpatient psychiatry.  Received his routine Invega shot 7/8  Seizure disorder:  Secondary to hyperglycemia and issues with noncompliance.  Continue Dilantin 100 mg 3 times daily.  Microcytic anemia -Iron studies relatively normal, hemoglobin stable  Congestive heart failure with reduced ejection fraction, 25%. Currently appears to be euvolemic.  Echo in May 2021-EF 25-30% Continue Coreg, Entresto.  Torsemide on hold  COPD: stable.  As needed bronchodilators  Hyperlipidemia: Daily Lipitor 40 mg  Hypokalemia/hypomagnesemia Replace as needed  Prolonged QTC:  Appears to have resolved.  Currently on Seroquel 200 mg daily.  Neuropathy -Gabapentin 300 mg 3 times daily.  Okay to leave IV line out at patient's request, no acute issues ongoing at this time.  DVT prophylaxis: Lovenox Code Status: Full code Family Communication: None at bedside, mother has been updated periodically  Status is: Inpatient   Dispo: The patient is from: Home              Anticipated d/c is to: ALF              Anticipated d/c date is: 1 day              Patient currently is medically stable to d/c.  Currently  awaiting placement, TOC team working on this.  Mother is currently working to obtain custodial/guardianship.  Unsafe  for discharge  Body mass index is 29.97 kg/m.   Subjective: Laying in the bed no complaints feels okay.  Ambulates frequently throughout the hallway multiple times a day  Review of Systems Otherwise negative except as per HPI, including: General: Denies fever, chills, night sweats or unintended weight loss. Resp: Denies cough, wheezing, shortness of breath. Cardiac: Denies chest pain, palpitations, orthopnea, paroxysmal nocturnal dyspnea. GI: Denies abdominal pain, nausea, vomiting, diarrhea or constipation GU: Denies dysuria, frequency, hesitancy or incontinence MS: Denies muscle aches, joint pain or swelling Neuro: Denies headache, neurologic deficits (focal weakness, numbness, tingling), abnormal gait Psych: Denies anxiety, depression, SI/HI/AVH Skin: Denies new rashes or lesions ID: Denies sick contacts, exotic exposures, travel  Examination:  Constitutional: Not in acute distress Respiratory: Clear to auscultation bilaterally Cardiovascular: Normal sinus rhythm, no rubs Abdomen: Nontender nondistended good bowel sounds Musculoskeletal: No edema noted Skin: No rashes seen Neurologic: CN 2-12 grossly intact.  And nonfocal Psychiatric: Normal judgment and insight. Alert and oriented x 3. Normal mood. Objective: Vitals:   03/13/20 2100 03/14/20 0003 03/14/20 0411 03/14/20 0821  BP: (!) 114/54 120/73 135/87 107/76  Pulse: 83 82 79 93  Resp: 17 17 18 20   Temp: 98.4 F (36.9 C) 97.6 F (36.4 C) 97.6 F (36.4 C) 98 F (36.7 C)  TempSrc: Oral Oral Oral Oral  SpO2: 98% 99% 100% 100%  Weight:        Intake/Output Summary (Last 24 hours) at 03/14/2020 1046 Last data filed at 03/14/2020 1004 Gross per 24 hour  Intake 480 ml  Output --  Net 480 ml   Filed Weights   03/11/20 0339 03/12/20 0500 03/13/20 0300  Weight: 90.6 kg 87.8 kg 89.4 kg     Data Reviewed:   CBC: Recent Labs  Lab 03/08/20 0701 03/11/20 0433  WBC 8.9 9.2  HGB 11.2* 11.0*  HCT 35.8* 35.3*   36.0*  MCV 64.2* 64.4*  PLT 195 217   Basic Metabolic Panel: Recent Labs  Lab 03/08/20 0701 03/10/20 0508 03/11/20 0433 03/14/20 0029  NA 133* 132* 129* 130*  K 4.1 4.4 4.4 4.4  CL 97* 98 96* 98  CO2 27 25 24 24   GLUCOSE 95 95 102* 96  BUN 9 11 10 10   CREATININE 0.47* 0.48* 0.53* 0.48*  CALCIUM 9.3 9.2 9.6 9.2  MG  --   --  1.9 1.8   GFR: Estimated Creatinine Clearance: 117.3 mL/min (A) (by C-G formula based on SCr of 0.48 mg/dL (L)). Liver Function Tests: No results for input(s): AST, ALT, ALKPHOS, BILITOT, PROT, ALBUMIN in the last 168 hours. No results for input(s): LIPASE, AMYLASE in the last 168 hours. No results for input(s): AMMONIA in the last 168 hours. Coagulation Profile: No results for input(s): INR, PROTIME in the last 168 hours. Cardiac Enzymes: No results for input(s): CKTOTAL, CKMB, CKMBINDEX, TROPONINI in the last 168 hours. BNP (last 3 results) No results for input(s): PROBNP in the last 8760 hours. HbA1C: No results for input(s): HGBA1C in the last 72 hours. CBG: No results for input(s): GLUCAP in the last 168 hours. Lipid Profile: No results for input(s): CHOL, HDL, LDLCALC, TRIG, CHOLHDL, LDLDIRECT in the last 72 hours. Thyroid Function Tests: No results for input(s): TSH, T4TOTAL, FREET4, T3FREE, THYROIDAB in the last 72 hours. Anemia Panel: No results for input(s): VITAMINB12, FOLATE, FERRITIN, TIBC, IRON, RETICCTPCT in the last 72 hours. Sepsis Labs:  No results for input(s): PROCALCITON, LATICACIDVEN in the last 168 hours.  No results found for this or any previous visit (from the past 240 hour(s)).       Radiology Studies: No results found.      Scheduled Meds: . atorvastatin  40 mg Oral Daily  . busPIRone  10 mg Oral TID  . carvedilol  3.125 mg Oral BID WC  . digoxin  0.125 mg Oral Daily  . enoxaparin (LOVENOX) injection  40 mg Subcutaneous Daily  . fluticasone furoate-vilanterol  1 puff Inhalation Daily  . furosemide  20 mg  Intravenous Once  . gabapentin  300 mg Oral TID  . melatonin  6 mg Oral QHS  . nicotine  21 mg Transdermal Daily  . phenytoin  100 mg Oral TID  . QUEtiapine  200 mg Oral Daily  . sacubitril-valsartan  1 tablet Oral BID   Continuous Infusions:    LOS: 20 days   Time spent= 25 mins    Ladd Cen Joline Maxcy, MD [facility]  If 7PM-7AM, please contact night-coverage  03/14/2020, 10:46 AM

## 2020-03-15 MED ORDER — FLUTICASONE PROPIONATE 50 MCG/ACT NA SUSP
1.0000 | Freq: Every day | NASAL | Status: AC
  Administered 2020-03-15 – 2020-03-19 (×4): 1 via NASAL
  Filled 2020-03-15: qty 16

## 2020-03-15 NOTE — Progress Notes (Signed)
PROGRESS NOTE    Jim Dunn  BJS:[id number] DOB: Jul 02, 1966 DOA: 02/23/2020 PCP: System, Pcp Not In   Brief Narrative:  54 year old male with past medical history of schizophrenia, seizure disorder, biventricular heart failure (Echo 01/2020 EF 25-30% with right-sided heart failure), severe tricuspid regurgitation, nicotine dependence and COPD who presents to [facility] emergency department with suicidal ideation and seizure.Patient explains that since he left the hospital several days ago (admitted 6/17-6/19- was being managed for hyponatremia, thought to be multifactorial in origin.  Patient left AMA on 6/19 and that time had sodium of 132), he has been experiencing increasing fatigue.  This increasing fatigue is associated with bilateral lower extremity tingling.  Symptoms were similar to what prompted his presentation on 6/17. Additionally, he reported feeling depressed with suicidal ideation, without a plan.  Patient reported that he had attempted suicide in the distant past using pills but was unsuccessful and never sought medical attention. While the patient was waiting in the waiting room, he experienced a witnessed seizure while attempting to walk to the bathroom.  Patient was immediately brought back to the emergency department for further evaluation.  Patient was found to have substantial recurrent hyponatremia of 117 with concurrent hypokalemia of 2.6 and hypomagnesium of 1.4.  Patient was administered 1236 mg of intravenous Fosphenytoin and initiated on low rate normal saline infusion.  Patient was administered 2 g of intravenous magnesium and a 30 mEq of potassium.   Patient's electrolytes have improved, no further seizure episodes at this time.  Echo showed EF of 25-30%, cardiology recommended outpatient follow-up but otherwise continue Coreg, torsemide and Entresto.     Assessment & Plan:   Principal Problem:   Hyponatremia Active Problems:   Chronic systolic CHF (congestive  heart failure) (HCC)   Mixed hyperlipidemia   COPD (chronic obstructive pulmonary disease) (HCC)   Seizure disorder (HCC)   Paranoid schizophrenia (HCC)   Hypokalemia   Hypomagnesemia   Suicidal ideation   Noncompliance with medications   Subtherapeutic serum dilantin level   Seizure (HCC)  Hyponatremia with seizures:  Intravascular dehydration, improving Holding off on diuretics torsemide, metolazone discontinued Diuretics currently on hold, tolerating orals.  Lungs are clear bilaterally   Depression, Suicidal ideations:  Resolved, seen by psychiatry.  Recommended to continue BuSpar, Neurontin, Invega.  Follow-up outpatient psychiatry.  Received his routine Invega shot 7/8, takes it monthly  Seizure disorder:  Secondary to hyperglycemia and issues with noncompliance.  Continue Dilantin 100 mg 3 times daily.  Microcytic anemia -Iron studies relatively normal, hemoglobin stable   Congestive heart failure with reduced ejection fraction, 25%. Currently appears to be euvolemic.  Echo in May 2021-EF 25-30% Continue Coreg, Entresto.  Torsemide on hold   COPD: stable.  As needed bronchodilators   Hyperlipidemia: Daily Lipitor 40 mg   Hypokalemia/hypomagnesemia Replace as needed   Prolonged QTC:  Appears to have resolved.  Currently on Seroquel 200 mg daily.   Neuropathy -Gabapentin 300 mg 3 times daily.  Okay to leave IV line out at patient's request, no acute issues ongoing at this time.   DVT prophylaxis: Lovenox Code Status: Full code Family Communication: None at bedside, mother has been updated periodically  Status is: Inpatient   Dispo: The patient is from: Home              Anticipated d/c is to: ALF              Anticipated d/c date is: 1 day  Patient currently is medically stable to d/c.  Currently awaiting placement, TOC team working on this.  Mother is currently working to obtain custodial/guardianship.  Unsafe for discharge  Body mass index  is 31.01 kg/m.   Subjective: Sitting in the chair, no complaints overall very pleasant.  Ambulates in the hallway multiple times, denies any shortness of breath.  Review of Systems Otherwise negative except as per HPI, including: General: Denies fever, chills, night sweats or unintended weight loss. Resp: Denies cough, wheezing, shortness of breath. Cardiac: Denies chest pain, palpitations, orthopnea, paroxysmal nocturnal dyspnea. GI: Denies abdominal pain, nausea, vomiting, diarrhea or constipation GU: Denies dysuria, frequency, hesitancy or incontinence MS: Denies muscle aches, joint pain or swelling Neuro: Denies headache, neurologic deficits (focal weakness, numbness, tingling), abnormal gait Psych: Denies anxiety, depression, SI/HI/AVH Skin: Denies new rashes or lesions ID: Denies sick contacts, exotic exposures, travel  Examination:  Constitutional: Not in acute distress Respiratory: Clear to auscultation bilaterally Cardiovascular: Normal sinus rhythm, no rubs Abdomen: Nontender nondistended good bowel sounds Musculoskeletal: No edema noted Skin: No rashes seen Neurologic: CN 2-12 grossly intact.  And nonfocal Psychiatric: Normal judgment and insight. Alert and oriented x 3. Normal mood. Objective: Vitals:   03/15/20 0300 03/15/20 0451 03/15/20 0741 03/15/20 0833  BP: 120/71  123/73   Pulse: 92  83   Resp: 18  20   Temp: 98 F (36.7 C)  98.1 F (36.7 C)   TempSrc: Oral  Oral   SpO2: 100%  100% 98%  Weight:  92.5 kg      Intake/Output Summary (Last 24 hours) at 03/15/2020 1008 Last data filed at 03/15/2020 0300 Gross per 24 hour  Intake 486 ml  Output 675 ml  Net -189 ml   Filed Weights   03/12/20 0500 03/13/20 0300 03/15/20 0451  Weight: 87.8 kg 89.4 kg 92.5 kg     Data Reviewed:   CBC: Recent Labs  Lab 03/11/20 0433  WBC 9.2  HGB 11.0*  HCT 35.3*  36.0*  MCV 64.4*  PLT 217   Basic Metabolic Panel: Recent Labs  Lab 03/10/20 0508  03/11/20 0433 03/14/20 0029  NA 132* 129* 130*  K 4.4 4.4 4.4  CL 98 96* 98  CO2 25 24 24   GLUCOSE 95 102* 96  BUN 11 10 10   CREATININE 0.48* 0.53* 0.48*  CALCIUM 9.2 9.6 9.2  MG  --  1.9 1.8   GFR: Estimated Creatinine Clearance: 119.2 mL/min (A) (by C-G formula based on SCr of 0.48 mg/dL (L)). Liver Function Tests: No results for input(s): AST, ALT, ALKPHOS, BILITOT, PROT, ALBUMIN in the last 168 hours. No results for input(s): LIPASE, AMYLASE in the last 168 hours. No results for input(s): AMMONIA in the last 168 hours. Coagulation Profile: No results for input(s): INR, PROTIME in the last 168 hours. Cardiac Enzymes: No results for input(s): CKTOTAL, CKMB, CKMBINDEX, TROPONINI in the last 168 hours. BNP (last 3 results) No results for input(s): PROBNP in the last 8760 hours. HbA1C: No results for input(s): HGBA1C in the last 72 hours. CBG: No results for input(s): GLUCAP in the last 168 hours. Lipid Profile: No results for input(s): CHOL, HDL, LDLCALC, TRIG, CHOLHDL, LDLDIRECT in the last 72 hours. Thyroid Function Tests: No results for input(s): TSH, T4TOTAL, FREET4, T3FREE, THYROIDAB in the last 72 hours. Anemia Panel: No results for input(s): VITAMINB12, FOLATE, FERRITIN, TIBC, IRON, RETICCTPCT in the last 72 hours. Sepsis Labs: No results for input(s): PROCALCITON, LATICACIDVEN in the last 168 hours.  No  results found for this or any previous visit (from the past 240 hour(s)).       Radiology Studies: No results found.      Scheduled Meds:  atorvastatin  40 mg Oral Daily   busPIRone  10 mg Oral TID   carvedilol  3.125 mg Oral BID WC   digoxin  0.125 mg Oral Daily   enoxaparin (LOVENOX) injection  40 mg Subcutaneous Daily   fluticasone furoate-vilanterol  1 puff Inhalation Daily   furosemide  20 mg Intravenous Once   gabapentin  300 mg Oral TID   melatonin  6 mg Oral QHS   nicotine  21 mg Transdermal Daily   phenytoin  100 mg Oral TID   QUEtiapine   200 mg Oral Daily   sacubitril-valsartan  1 tablet Oral BID   Continuous Infusions:     LOS: 21 days   Time spent= 25 mins    Yuval Rubens Joline Maxcy, MD [facility]  If 7PM-7AM, please contact night-coverage  03/15/2020, 10:08 AM

## 2020-03-15 NOTE — Progress Notes (Signed)
Auto CPAP set up for pt (min 5-max 18), large FFM, RA. Pt states he will place mask on himself when ready. RT showed pt how to turn on machine, and advised pt to notify for RT if any further assistance is needed.

## 2020-03-16 NOTE — Progress Notes (Signed)
PROGRESS NOTE    Jim Dunn  XKG:[id number] DOB: [birth year] DOA: 02/23/2020 PCP: System, Pcp Not In   Brief Narrative:  54 year old male with past medical history of schizophrenia, seizure disorder, biventricular heart failure (Echo 01/2020 EF 25-30% with right-sided heart failure), severe tricuspid regurgitation, nicotine dependence and COPD who presents to [facility] emergency department with suicidal ideation and seizure.Patient explains that since he left the hospital several days ago (admitted 6/17-6/19- was being managed for hyponatremia, thought to be multifactorial in origin.  Patient left AMA on 6/19 and that time had sodium of 132), he has been experiencing increasing fatigue.  This increasing fatigue is associated with bilateral lower extremity tingling.  Symptoms were similar to what prompted his presentation on 6/17. Additionally, he reported feeling depressed with suicidal ideation, without a plan.  Patient reported that he had attempted suicide in the distant past using pills but was unsuccessful and never sought medical attention. While the patient was waiting in the waiting room, he experienced a witnessed seizure while attempting to walk to the bathroom.  Patient was immediately brought back to the emergency department for further evaluation.  Patient was found to have substantial recurrent hyponatremia of 117 with concurrent hypokalemia of 2.6 and hypomagnesium of 1.4.  Patient was administered 1236 mg of intravenous Fosphenytoin and initiated on low rate normal saline infusion.  Patient was administered 2 g of intravenous magnesium and a 30 mEq of potassium.   Patient's electrolytes have improved, no further seizure episodes at this time.  Echo showed EF of 25-30%, cardiology recommended outpatient follow-up but otherwise continue Coreg, torsemide and Entresto.     Assessment & Plan:   Principal Problem:   Hyponatremia Active Problems:   Chronic systolic CHF (congestive  heart failure) (HCC)   Mixed hyperlipidemia   COPD (chronic obstructive pulmonary disease) (HCC)   Seizure disorder (HCC)   Paranoid schizophrenia (HCC)   Hypokalemia   Hypomagnesemia   Suicidal ideation   Noncompliance with medications   Subtherapeutic serum dilantin level   Seizure (HCC)   Hyponatremia with seizures:  Intravascular dehydration, resolved Holding off on diuretics torsemide, metolazone discontinued.  Currently appears euvolemic Diuretics currently on hold, tolerating orals.  Lungs are clear bilaterally   Depression, Suicidal ideations:  Resolved, seen by psychiatry.  Recommended to continue BuSpar, Neurontin, Invega.  Follow-up outpatient psychiatry.  Received his routine Invega shot 7/8, takes this monthly  Seizure disorder:  Stable Secondary to hyperglycemia and issues with noncompliance.  Continue Dilantin 100 mg 3 times daily.  Microcytic anemia -Iron studies relatively normal, hemoglobin stable   Congestive heart failure with reduced ejection fraction, 25%. Euvolemic.  Echo in May 2021-EF 25-30% Continue Coreg, Entresto.  Torsemide on hold   COPD: stable.  As needed bronchodilators   Hyperlipidemia: Daily Lipitor 40 mg   Hypokalemia/hypomagnesemia Replace as needed   Prolonged QTC:  Appears to have resolved.  Currently on Seroquel 200 mg daily.   Neuropathy -Gabapentin 300 mg 3 times daily.  Okay to leave IV line out at patient's request, no acute issues ongoing at this time.   DVT prophylaxis: Lovenox Code Status: Full code Family Communication: Met with mother at bedside day before yesterday  Status is: Inpatient   Dispo: The patient is from: Home              Anticipated d/c is to: ALF              Anticipated d/c date is: 1 day  Patient currently is medically stable to d/c.  Currently awaiting placement, TOC team working on this.  Mother is currently working to obtain custodial/guardianship.  Unsafe for discharge  Body  mass index is 31.01 kg/m.   Subjective: Sitting in the chair no complaints.  Asking me if he can order one meal from [employer] at some point.  I told him he can have one meal from there  Review of Systems Otherwise negative except as per HPI, including: General: Denies fever, chills, night sweats or unintended weight loss. Resp: Denies cough, wheezing, shortness of breath. Cardiac: Denies chest pain, palpitations, orthopnea, paroxysmal nocturnal dyspnea. GI: Denies abdominal pain, nausea, vomiting, diarrhea or constipation GU: Denies dysuria, frequency, hesitancy or incontinence MS: Denies muscle aches, joint pain or swelling Neuro: Denies headache, neurologic deficits (focal weakness, numbness, tingling), abnormal gait Psych: Denies anxiety, depression, SI/HI/AVH Skin: Denies new rashes or lesions ID: Denies sick contacts, exotic exposures, travel  Examination:  Constitutional: Not in acute distress Respiratory: Clear to auscultation bilaterally Cardiovascular: Normal sinus rhythm, no rubs Abdomen: Nontender nondistended good bowel sounds Musculoskeletal: No edema noted Skin: No rashes seen Neurologic: CN 2-12 grossly intact.  And nonfocal Psychiatric: Normal judgment and insight. Alert and oriented x 3. Normal mood.  Objective: Vitals:   03/15/20 2320 03/16/20 0038 03/16/20 0411 03/16/20 0819  BP: 129/72 (!) 188/81 132/84 131/79  Pulse: 86 (!) 108 75 82  Resp: _0 Temp: 98 F (36.7 C) 100.2 F (37.9 C) 98.1 F (36.7 C) 97.8 F (36.6 C)  TempSrc: Oral Oral Oral Oral  SpO2: 99% 100% 100% 97%  Weight:       No intake or output data in the 24 hours ending 03/16/20 0921 Filed Weights   03/12/20 0500 03/13/20 0300 03/15/20 0451  Weight: 87.8 kg 89.4 kg 92.5 kg     Data Reviewed:   CBC: Recent Labs  Lab 03/11/20 0433  WBC 9.2  HGB 11.0*  HCT 35.3*  36.0*  MCV 64.4*  PLT 845   Basic Metabolic Panel: Recent Labs  Lab 03/10/20 0508  03/11/20 0433 03/14/20 0029  NA 132* 129* 130*  K 4.4 4.4 4.4  CL 98 96* 98  CO2 _1 GLUCOSE 95 102* 96  BUN _2 CREATININE 0.48* 0.53* 0.48*  CALCIUM 9.2 9.6 9.2  MG  --  1.9 1.8   GFR: Estimated Creatinine Clearance: 119.2 mL/min (A) (by C-G formula based on SCr of 0.48 mg/dL (L)). Liver Function Tests: No results for input(s): AST, ALT, ALKPHOS, BILITOT, PROT, ALBUMIN in the last 168 hours. No results for input(s): LIPASE, AMYLASE in the last 168 hours. No results for input(s): AMMONIA in the last 168 hours. Coagulation Profile: No results for input(s): INR, PROTIME in the last 168 hours. Cardiac Enzymes: No results for input(s): CKTOTAL, CKMB, CKMBINDEX, TROPONINI in the last 168 hours. BNP (last 3 results) No results for input(s): PROBNP in the last 8760 hours. HbA1C: No results for input(s): HGBA1C in the last 72 hours. CBG: No results for input(s): GLUCAP in the last 168 hours. Lipid Profile: No results for input(s): CHOL, HDL, LDLCALC, TRIG, CHOLHDL, LDLDIRECT in the last 72 hours. Thyroid Function Tests: No results for input(s): TSH, T4TOTAL, FREET4, T3FREE, THYROIDAB in the last 72 hours. Anemia Panel: No results for input(s): VITAMINB12, FOLATE, FERRITIN, TIBC, IRON, RETICCTPCT in the last 72 hours. Sepsis Labs: No results for input(s): PROCALCITON, LATICACIDVEN in the last 168 hours.  No results found  for this or any previous visit (from the past 240 hour(s)).       Radiology Studies: No results found.      Scheduled Meds: . atorvastatin  40 mg Oral Daily  . busPIRone  10 mg Oral TID  . carvedilol  3.125 mg Oral BID WC  . digoxin  0.125 mg Oral Daily  . enoxaparin (LOVENOX) injection  40 mg Subcutaneous Daily  . fluticasone  1 spray Each Nare Daily  . fluticasone furoate-vilanterol  1 puff Inhalation Daily  . furosemide  20 mg Intravenous Once  . gabapentin  300 mg Oral TID  . melatonin  6 mg Oral QHS  . nicotine  21 mg Transdermal  Daily  . phenytoin  100 mg Oral TID  . QUEtiapine  200 mg Oral Daily  . sacubitril-valsartan  1 tablet Oral BID   Continuous Infusions:    LOS: 22 days   Time spent= 25 mins    Jakaylee Sasaki Arsenio Loader, MD [facility]  If 7PM-7AM, please contact night-coverage  03/16/2020, 9:21 AM

## 2020-03-17 LAB — BASIC METABOLIC PANEL
Anion gap: 10 (ref 5–15)
BUN: 10 mg/dL (ref 6–20)
CO2: 25 mmol/L (ref 22–32)
Calcium: 9.6 mg/dL (ref 8.9–10.3)
Chloride: 100 mmol/L (ref 98–111)
Creatinine, Ser: 0.49 mg/dL — ABNORMAL LOW (ref 0.61–1.24)
GFR calc Af Amer: >60 mL/min (ref >60)
GFR calc non Af Amer: >60 mL/min (ref >60)
Glucose, Bld: 100 mg/dL — ABNORMAL HIGH (ref 70–99)
Potassium: 4.4 mmol/L (ref 3.5–5.1)
Sodium: 135 mmol/L (ref 135–145)

## 2020-03-17 MED ORDER — GABAPENTIN 300 MG PO CAPS
300.0000 mg | ORAL_CAPSULE | Freq: Four times a day (QID) | ORAL | Status: DC
  Administered 2020-03-17 – 2020-04-13 (×103): 300 mg via ORAL
  Filled 2020-03-17 (×105): qty 1

## 2020-03-17 NOTE — Progress Notes (Signed)
[facility] PROGRESS NOTE  Jim Dunn MKL:[id number] DOB: August 27, 1966 DOA: 02/23/2020 PCP: System, Pcp Not In  Status: Inpatient---Remains inpatient appropriate because:Altered mental status and Unsafe d/c plan   Dispo: The patient is from: Home              Anticipated d/c is to: ALF              Anticipated d/c date is: > 3 days              Patient currently is medically stable to d/c.  Currently awaiting placement, TOC team working on this.  Mother is currently working to obtain custodial/guardianship.  Unsafe for discharge   HPI: 54 year old male with past medical history of schizophrenia, seizure disorder, biventricular heart failure(Echo 01/2020 EF 25-30%with right-sided heart failure),severe tricuspid regurgitation, nicotine dependence and COPD who presents to [facility] emergency department with suicidal ideation and seizure.Patient explains that since he left the hospital several days ago(admitted6/17-6/19-was being managed for hyponatremia, thought to be multifactorial in origin. Patient left AMA on6/19 and that time had sodium of 132),he has been experiencing increasing fatigue. This increasing fatigue is associated with bilateral lower extremity tingling. Symptomsweresimilar to what prompted his presentation on 6/17. Additionally, he reported feeling depressed withsuicidal ideation, withouta plan. Patient reportedthat he hadattempted suicide in the distant past using pills but was unsuccessful and never sought medical attention. While the patient was waiting in the waiting room, he experienced a witnessed seizure while attempting to walk to the bathroom. Patient was immediately brought back to the emergency department for further evaluation. Patient was found to have substantial recurrent hyponatremia of 117 with concurrent hypokalemia of 2.6 and hypomagnesium of 1.4. Patient was administered 1276m of intravenous Fosphenytoinand initiated on low rate  normal saline infusion. Patient was administered 2 g of intravenous magnesium and a 30 mEq of potassium.  Patient's electrolytes have improved, no further seizure episodes at this time.  Echo showed EF of 25-30%, cardiology recommended outpatient follow-up but otherwise continue Coreg, torsemide and Entresto.    Subjective: Denies shortness of breath, swelling in legs State feet are burning and tingling and equates this to his known peripheral neuropathy Is interested in eventually being transition to group home-states has previously resided in group home remotely  Objective: Vitals:   03/17/20 0814 03/17/20 1200  BP:  (!) 151/96  Pulse:  75  Resp:  18  Temp:  97.6 F (36.4 C)  SpO2: 93% 100%    Intake/Output Summary (Last 24 hours) at 03/17/2020 1221 Last data filed at 03/17/2020 0130 Gross per 24 hour  Intake 240 ml  Output 700 ml  Net -460 ml   Filed Weights   03/12/20 0500 03/13/20 0300 03/15/20 0451  Weight: 87.8 kg 89.4 kg 92.5 kg    Exam:  Constitutional: NAD, calm, comfortable ENMT: Mucous membranes are moist..Normal dentition.  Respiratory: clear to auscultation bilaterally, no wheezing, no crackles. Normal respiratory effort.  Room air Cardiovascular: Regular rate and rhythm, no murmurs / rubs / gallops. No extremity edema.  No JVD.  2+ pedal pulses.  Abdomen: no tenderness, no masses palpated. No hepatosplenomegaly. Bowel sounds positive.  Musculoskeletal: no clubbing / cyanosis. No joint deformity upper and lower extremities. Good ROM, no contractures. Normal muscle tone.  Skin: no rashes, lesions, ulcers. No induration Neurologic: CN 2-12 grossly intact. Sensation intact, DTR normal. Strength 5/5 x all 4 extremities.  Psychiatric: Alert and oriented x 3 although does not have appropriate insight or capacity to manage  care independently secondary to longstanding schizophrenia. Normal mood.    Assessment/Plan:  Hyponatremia with seizures: Intravascular  dehydration, resolved Holding off on diuretics- torsemide, metolazone discontinued.  Currently appears euvolemic  Depression, Suicidal ideations: Resolved, seen by psychiatry.  Recommended to continue BuSpar, Neurontin, Invega.  Follow-up outpatient psychiatry.  Received his routine Invega shot 7/8, takes this monthly  Seizure disorder: Stable Secondary to hyponatremia and issues with noncompliance with medications prior to admission.   Continue Dilantin 100 mg 3 times daily.  Microcytic anemia Iron studies relatively normal, hemoglobin stable  Chronic congestive heart failure with reduced ejection fraction, 25%./Associated right heart failure Euvolemic.  Echo in May 2021-EF 25-30% Continue Coreg, Entresto.   Torsemide on hold and would recommend diuretics on a prn basis only with close monitoring of electrolytes if required Daily weights with strict I's/O On heart healthy diet without fluid restriction  COPD: stable. As needed bronchodilators  Hyperlipidemia: Daily Lipitor 40 mg  Hypokalemia/hypomagnesemia Replace as needed  Prolonged QTC:  Appears to have resolved.  Currently on Seroquel 200 mg daily.  Neuropathy Currently on gabapentin 300 mg 3 times daily.  We will add a bedtime dose of 300 mg to see if this helps patient's discomfort  Modest obesity Estimated body mass index is 31.01 kg/m as calculated from the following:   Height as of 02/20/20: 5' 8"  (1.727 m).   Weight as of this encounter: 92.5 kg.  **Okay to leave IV line out at patient's request, no acute issues ongoing at this time.    Data Reviewed: Basic Metabolic Panel: Recent Labs  Lab 03/11/20 0433 03/14/20 0029 03/17/20 0117  NA 129* 130* 135  K 4.4 4.4 4.4  CL 96* 98 100  CO2 24 24 25   GLUCOSE 102* 96 100*  BUN 10 10 10   CREATININE 0.53* 0.48* 0.49*  CALCIUM 9.6 9.2 9.6  MG 1.9 1.8  --    Liver Function Tests: No results for input(s): AST, ALT, ALKPHOS, BILITOT, PROT,  ALBUMIN in the last 168 hours. No results for input(s): LIPASE, AMYLASE in the last 168 hours. No results for input(s): AMMONIA in the last 168 hours. CBC: Recent Labs  Lab 03/11/20 0433  WBC 9.2  HGB 11.0*  HCT 35.3*  36.0*  MCV 64.4*  PLT 217   Cardiac Enzymes: No results for input(s): CKTOTAL, CKMB, CKMBINDEX, TROPONINI in the last 168 hours. BNP (last 3 results) Recent Labs    01/22/20 0410 02/24/20 0030  BNP 697.9* 184.7*    ProBNP (last 3 results) No results for input(s): PROBNP in the last 8760 hours.  CBG: No results for input(s): GLUCAP in the last 168 hours.  No results found for this or any previous visit (from the past 240 hour(s)).   Studies: No results found.  Scheduled Meds: . atorvastatin  40 mg Oral Daily  . busPIRone  10 mg Oral TID  . carvedilol  3.125 mg Oral BID WC  . digoxin  0.125 mg Oral Daily  . enoxaparin (LOVENOX) injection  40 mg Subcutaneous Daily  . fluticasone  1 spray Each Nare Daily  . fluticasone furoate-vilanterol  1 puff Inhalation Daily  . furosemide  20 mg Intravenous Once  . gabapentin  300 mg Oral TID  . melatonin  6 mg Oral QHS  . nicotine  21 mg Transdermal Daily  . phenytoin  100 mg Oral TID  . QUEtiapine  200 mg Oral Daily  . sacubitril-valsartan  1 tablet Oral BID   Continuous Infusions:  Principal  Problem:   Hyponatremia Active Problems:   Chronic systolic CHF (congestive heart failure) (HCC)   Mixed hyperlipidemia   COPD (chronic obstructive pulmonary disease) (HCC)   Seizure disorder (HCC)   Paranoid schizophrenia ([place])   Hypokalemia   Hypomagnesemia   Suicidal ideation   Noncompliance with medications   Subtherapeutic serum dilantin level   Seizure ([place])     Code Status: Full Family Communication: Patient only on 7/14.  Attending met with patient's mother at the bedside on 7/12. DVT prophylaxis: Lovenox   Consultants:  None  Procedures:  None  Antibiotics: Anti-infectives (From  admission, onward)   None        Time spent: 20 minutes    Erin Hearing ANP  [facility] Pager [phone]. If 7PM-7AM, please contact night-coverage at [email or web address] 03/17/2020, 12:21 PM  LOS: 23 days

## 2020-03-17 NOTE — Progress Notes (Signed)
RT placed patient on CPAP. Patient tolerating well at this time. RT will monitor as needed. 

## 2020-03-17 NOTE — Progress Notes (Signed)
cpap adjusted for PT, he states he will place on himself when ready.

## 2020-03-18 NOTE — Progress Notes (Addendum)
[facility] PROGRESS NOTE  Jim Dunn FAO:[id number] DOB: Sep 10, 1965 DOA: 02/23/2020 PCP: System, Pcp Not In  Status: Inpatient---Remains inpatient appropriate because:Altered mental status and Unsafe d/c plan   Dispo: The patient is from: Home              Anticipated d/c is to: ALF              Anticipated d/c date is: > 3 days              Patient currently is medically stable to d/c.  Currently awaiting placement, TOC team working on this.  Mother is currently working to obtain custodial/guardianship.  Unsafe for discharge  Code Status: Full Family Communication: 7/15 updated patient's mother Jim Dunn by telephone [phone] DVT prophylaxis: Lovenox   HPI: 54 year old male with past medical history of schizophrenia, seizure disorder, biventricular heart failure(Echo 01/2020 EF 25-30%with right-sided heart failure),severe tricuspid regurgitation, nicotine dependence and COPD who presents to [facility] emergency department with suicidal ideation and seizure.Patient explains that since he left the hospital several days ago(admitted6/17-6/19-was being managed for hyponatremia, thought to be multifactorial in origin. Patient left AMA on6/19 and that time had sodium of 132),he has been experiencing increasing fatigue. This increasing fatigue is associated with bilateral lower extremity tingling. Symptomsweresimilar to what prompted his presentation on 6/17. Additionally, he reported feeling depressed withsuicidal ideation, withouta plan. Patient reportedthat he hadattempted suicide in the distant past using pills but was unsuccessful and never sought medical attention. While the patient was waiting in the waiting room, he experienced a witnessed seizure while attempting to walk to the bathroom. Patient was immediately brought back to the emergency department for further evaluation. Patient was found to have substantial recurrent hyponatremia of 117 with concurrent  hypokalemia of 2.6 and hypomagnesium of 1.4. Patient was administered 1236mg  of intravenous Fosphenytoinand initiated on low rate normal saline infusion. Patient was administered 2 g of intravenous magnesium and a 30 mEq of potassium.  Patient's electrolytes have improved, no further seizure episodes at this time.  Echo showed EF of 25-30%, cardiology recommended outpatient follow-up but otherwise continue Coreg, torsemide and Entresto.    Subjective: Sitting up in chair eating a small bag of Fritos corn chips Discussed with patient that snacks such as potato chips and corn chips have too much salt and can lead to heart failure.  He appeared surprised of this information. Discussed with patient that his mother can explore transitioning to group home after discharge to assisted living.  Explained to patient the concept and layout of a typical assisted living facility and encouraged him that there is communal dining as well as multiple people he can make friends with and activities to participate in once he is established.  Objective: Vitals:   03/18/20 0800 03/18/20 1110  BP: 111/79 114/66  Pulse: 93 75  Resp: 20 20  Temp: 98.2 F (36.8 C) 97.6 F (36.4 C)  SpO2: 99% 100%    Intake/Output Summary (Last 24 hours) at 03/18/2020 1300 Last data filed at 03/17/2020 1930 Gross per 24 hour  Intake 240 ml  Output --  Net 240 ml   Filed Weights   03/12/20 0500 03/13/20 0300 03/15/20 0451  Weight: 87.8 kg 89.4 kg 92.5 kg    Exam:  Constitutional: NAD, calm, comfortable ENMT: Mucous membranes are moist. Respiratory: clear to auscultation bilaterally, Normal respiratory effort.  Room air Cardiovascular: Regular rate and rhythm, no murmurs / rubs / gallops. No extremity edema.  No JVD.  2+  pedal pulses.  Abdomen: no tenderness, no masses palpated.  Bowel sounds positive.  Tolerating diet without difficulty Musculoskeletal: no clubbing / cyanosis. No joint deformity upper and lower  extremities. Good ROM, no contractures. Normal muscle tone.  Skin: no rashes, lesions, ulcers. No induration Neurologic: CN 2-12 grossly intact. Sensation intact, DTR normal. Strength 5/5 x all 4 extremities.  Psychiatric: Alert and oriented x 3 although does not have appropriate insight or capacity to manage care independently secondary to longstanding schizophrenia. Normal mood.    Assessment/Plan:  Hyponatremia with seizures: Intravascular dehydration, resolved Holding off on diuretics- torsemide, metolazone discontinued.   Currently appears euvolemic  Depression, Suicidal ideations: Resolved, seen by psychiatry.  Recommended to continue BuSpar, Neurontin, Invega.   Follow-up outpatient psychiatry.   Received his routine Invega shot 7/8, takes this monthly-next dose due 8/8  Seizure disorder: Stable Secondary to hyponatremia and issues with noncompliance with medications prior to admission.   Continue Dilantin 100 mg 3 times daily.  Microcytic anemia Iron studies relatively normal, hemoglobin stable  Chronic congestive heart failure with reduced ejection fraction, 25%./Associated right heart failure Euvolemic.  Echo in May 2021-EF 25-30% Continue Coreg, Entresto.   Torsemide on hold and would recommend diuretics on a prn basis only with close monitoring of electrolytes if required Daily weights with strict I's/O On heart healthy diet without fluid restriction  COPD: stable. As needed bronchodilators  Hyperlipidemia: Daily Lipitor 40 mg  Hypokalemia/hypomagnesemia Replace as needed  Prolonged QTC:  Appears to have resolved.   Can you Seroquel 200 mg daily.  Neuropathy Currently on gabapentin 300 mg 3 times daily.   On 7/14 added a bedtime dose of 300 mg to see if improves patient's neuropathic foot pain  Modest obesity Estimated body mass index is 31.01 kg/m as calculated from the following:   Height as of 02/20/20: 5\' 8"  (1.727 m).   Weight as of  this encounter: 92.5 kg.  **Okay to leave IV line out at patient's request, no acute issues ongoing at this time.    Data Reviewed: Basic Metabolic Panel: Recent Labs  Lab 03/14/20 0029 03/17/20 0117  NA 130* 135  K 4.4 4.4  CL 98 100  CO2 24 25  GLUCOSE 96 100*  BUN 10 10  CREATININE 0.48* 0.49*  CALCIUM 9.2 9.6  MG 1.8  --    Liver Function Tests: No results for input(s): AST, ALT, ALKPHOS, BILITOT, PROT, ALBUMIN in the last 168 hours. No results for input(s): LIPASE, AMYLASE in the last 168 hours. No results for input(s): AMMONIA in the last 168 hours. CBC: No results for input(s): WBC, NEUTROABS, HGB, HCT, MCV, PLT in the last 168 hours. Cardiac Enzymes: No results for input(s): CKTOTAL, CKMB, CKMBINDEX, TROPONINI in the last 168 hours. BNP (last 3 results) Recent Labs    01/22/20 0410 02/24/20 0030  BNP 697.9* 184.7*    ProBNP (last 3 results) No results for input(s): PROBNP in the last 8760 hours.  CBG: No results for input(s): GLUCAP in the last 168 hours.  No results found for this or any previous visit (from the past 240 hour(s)).   Studies: No results found.  Scheduled Meds: . atorvastatin  40 mg Oral Daily  . busPIRone  10 mg Oral TID  . carvedilol  3.125 mg Oral BID WC  . digoxin  0.125 mg Oral Daily  . enoxaparin (LOVENOX) injection  40 mg Subcutaneous Daily  . fluticasone  1 spray Each Nare Daily  . fluticasone furoate-vilanterol  1  puff Inhalation Daily  . furosemide  20 mg Intravenous Once  . gabapentin  300 mg Oral QID  . melatonin  6 mg Oral QHS  . nicotine  21 mg Transdermal Daily  . phenytoin  100 mg Oral TID  . QUEtiapine  200 mg Oral Daily  . sacubitril-valsartan  1 tablet Oral BID   Continuous Infusions:  Principal Problem:   Hyponatremia Active Problems:   Chronic systolic CHF (congestive heart failure) (HCC)   Mixed hyperlipidemia   COPD (chronic obstructive pulmonary disease) (HCC)   Seizure disorder (HCC)   Paranoid  schizophrenia (HCC)   Hypokalemia   Hypomagnesemia   Suicidal ideation   Noncompliance with medications   Subtherapeutic serum dilantin level   Seizure (HCC)        Consultants:  None  Procedures:  None  Antibiotics: Anti-infectives (From admission, onward)   None       Time spent: 20 minutes    Junious Silk ANP  [facility] Pager [phone]. If 7PM-7AM, please contact night-coverage at [email or web address] 03/18/2020, 1:00 PM  LOS: 24 days

## 2020-03-19 NOTE — TOC Progression Note (Signed)
Transition of Care [facility]) - Progression Note    Patient Details  Name: Jim Dunn MRN: [id number] Date of Birth: 04/08/1966  Transition of Care [facility]) CM/SW Contact  Baldemar Lenis, [place] Phone Number: 03/19/2020, 2:58 PM  Clinical Narrative:  CSW received call back from Administrator at [facility] that the patient's referral was received, but the patient does not make enough on his disability to qualify for ALF placement; he could not afford it with his income. CSW to follow.   Expected Discharge Plan: Assisted Living Barriers to Discharge: Continued Medical Work up, Unsafe home situation  Expected Discharge Plan and Services Expected Discharge Plan: Assisted Living       Living arrangements for the past 2 months: Single Family Home                                       Social Determinants of Health (SDOH) Interventions    Readmission Risk Interventions Readmission Risk Prevention Plan 01/23/2020  Transportation Screening Complete  HRI or Home Care Consult Complete  Social Work Consult for Recovery Care Planning/Counseling Complete  Palliative Care Screening Not Applicable  Medication Review Oceanographer) (No Data)

## 2020-03-19 NOTE — Progress Notes (Signed)
[facility] PROGRESS NOTE  Peterson Mathey POE:[id number] DOB: Feb 23, 1966 DOA: 02/23/2020 PCP: System, Pcp Not In  Status: Inpatient---Remains inpatient appropriate because:Altered mental status and Unsafe d/c plan   Dispo: The patient is from: Home              Anticipated d/c is to: ALF              Anticipated d/c date is: > 3 days              Patient currently is medically stable to d/c.  Currently awaiting placement, TOC team working on this.  Mother is currently working to obtain custodial/guardianship.  Unsafe for discharge  Code Status: Full Family Communication: 7/15 updated [name] by telephone [phone] DVT prophylaxis: Lovenox   HPI: 54 year old male with past medical history of schizophrenia, seizure disorder, biventricular heart failure(Echo 01/2020 EF 25-30%with right-sided heart failure),severe tricuspid regurgitation, nicotine dependence and COPD who presents to [facility] emergency department with suicidal ideation and seizure.Patient explains that since he left the hospital several days ago(admitted6/17-6/19-was being managed for hyponatremia, thought to be multifactorial in origin. Patient left AMA on6/19 and that time had sodium of 132),he has been experiencing increasing fatigue. This increasing fatigue is associated with bilateral lower extremity tingling. Symptomsweresimilar to what prompted his presentation on 6/17. Additionally, he reported feeling depressed withsuicidal ideation, withouta plan. Patient reportedthat he hadattempted suicide in the distant past using pills but was unsuccessful and never sought medical attention. While the patient was waiting in the waiting room, he experienced a witnessed seizure while attempting to walk to the bathroom. Patient was immediately brought back to the emergency department for further evaluation. Patient was found to have substantial recurrent hyponatremia of 117 with concurrent  hypokalemia of 2.6 and hypomagnesium of 1.4. Patient was administered 1236mg  of intravenous Fosphenytoinand initiated on low rate normal saline infusion. Patient was administered 2 g of intravenous magnesium and a 30 mEq of potassium.  Patient's electrolytes have improved, no further seizure episodes at this time.  Echo showed EF of 25-30%, cardiology recommended outpatient follow-up but otherwise continue Coreg, torsemide and Entresto.    Subjective: Ambulsting in room. States pedal neuropathy sx's better Objective: Vitals:   03/19/20 0830 03/19/20 0932  BP: (!) 150/83   Pulse: 77   Resp:    Temp:    SpO2:  96%    Intake/Output Summary (Last 24 hours) at 03/19/2020 1014 Last data filed at 03/18/2020 2100 Gross per 24 hour  Intake 480 ml  Output --  Net 480 ml   Filed Weights   03/12/20 0500 03/13/20 0300 03/15/20 0451  Weight: 87.8 kg 89.4 kg 92.5 kg    Exam:  Constitutional: NAD, calm, comfortable ENMT: Mucous membranes are moist. Respiratory: clear to auscultation bilaterally, Normal respiratory effort.  Room air Cardiovascular: Regular rate and rhythm, no murmurs / rubs / gallops. No extremity edema.  No JVD.  2+ pedal pulses.  Abdomen: no tenderness, no masses palpated.  Bowel sounds positive.  Tolerating diet without difficulty Musculoskeletal: no clubbing / cyanosis. No joint deformity upper and lower extremities. Good ROM, no contractures. Normal muscle tone.  Skin: no rashes, lesions, ulcers. No induration Neurologic: CN 2-12 grossly intact. Sensation intact, DTR normal. Strength 5/5 x all 4 extremities. Ambulates independently Psychiatric: Alert and oriented x 3 although does not have appropriate insight or capacity to manage care independently secondary to longstanding schizophrenia. Normal mood.    Assessment/Plan:  Hyponatremia with seizures: Intravascular dehydration, resolved  Holding off on diuretics- torsemide, metolazone discontinued.   Na+ 135 on  7/14- ck lytes prn   Currently appears euvolemic  Depression, Suicidal ideations: Resolved, seen by psychiatry.  Recommended to continue BuSpar, Neurontin, Invega.   Follow-up outpatient psychiatry.   Received his routine Invega shot 7/8, takes this monthly-next dose due 8/8  Seizure disorder: Stable Secondary to hyponatremia and issues with noncompliance with medications prior to admission.   Continue Dilantin 100 mg 3 times daily.  Microcytic anemia Iron studies relatively normal, hemoglobin stable  Chronic congestive heart failure with reduced ejection fraction, 25%./Associated right heart failure Euvolemic.  Echo in May 2021-EF 25-30% Continue Coreg, Entresto.   Torsemide on hold and would recommend diuretics on a prn basis only with close monitoring of electrolytes if required Daily weights with strict I's/O On heart healthy diet without fluid restriction  COPD: stable. As needed bronchodilators  Hyperlipidemia: Daily Lipitor 40 mg  Hypokalemia/hypomagnesemia Replace as needed  Prolonged QTC:  Appears to have resolved.   Can you Seroquel 200 mg daily.  Neuropathy Currently on gabapentin 300 mg 3 times daily.   On 7/14 added a bedtime dose of 300 mg to see if improves patient's neuropathic foot pain  Modest obesity Estimated body mass index is 31.01 kg/m as calculated from the following:   Height as of 02/20/20: 5\' 8"  (1.727 m).   Weight as of this encounter: 92.5 kg.  **Okay to leave IV line out at patient's request, no acute issues ongoing at this time.    Data Reviewed: Basic Metabolic Panel: Recent Labs  Lab 03/14/20 0029 03/17/20 0117  NA 130* 135  K 4.4 4.4  CL 98 100  CO2 24 25  GLUCOSE 96 100*  BUN 10 10  CREATININE 0.48* 0.49*  CALCIUM 9.2 9.6  MG 1.8  --    Liver Function Tests: No results for input(s): AST, ALT, ALKPHOS, BILITOT, PROT, ALBUMIN in the last 168 hours. No results for input(s): LIPASE, AMYLASE in the last 168  hours. No results for input(s): AMMONIA in the last 168 hours. CBC: No results for input(s): WBC, NEUTROABS, HGB, HCT, MCV, PLT in the last 168 hours. Cardiac Enzymes: No results for input(s): CKTOTAL, CKMB, CKMBINDEX, TROPONINI in the last 168 hours. BNP (last 3 results) Recent Labs    01/22/20 0410 02/24/20 0030  BNP 697.9* 184.7*    ProBNP (last 3 results) No results for input(s): PROBNP in the last 8760 hours.  CBG: No results for input(s): GLUCAP in the last 168 hours.  No results found for this or any previous visit (from the past 240 hour(s)).   Studies: No results found.  Scheduled Meds: . atorvastatin  40 mg Oral Daily  . busPIRone  10 mg Oral TID  . carvedilol  3.125 mg Oral BID WC  . digoxin  0.125 mg Oral Daily  . enoxaparin (LOVENOX) injection  40 mg Subcutaneous Daily  . fluticasone  1 spray Each Nare Daily  . fluticasone furoate-vilanterol  1 puff Inhalation Daily  . furosemide  20 mg Intravenous Once  . gabapentin  300 mg Oral QID  . melatonin  6 mg Oral QHS  . nicotine  21 mg Transdermal Daily  . phenytoin  100 mg Oral TID  . QUEtiapine  200 mg Oral Daily  . sacubitril-valsartan  1 tablet Oral BID   Continuous Infusions:  Principal Problem:   Hyponatremia Active Problems:   Chronic systolic CHF (congestive heart failure) (HCC)   Mixed hyperlipidemia   COPD (  chronic obstructive pulmonary disease) (HCC)   Seizure disorder (HCC)   Paranoid schizophrenia (HCC)   Hypokalemia   Hypomagnesemia   Suicidal ideation   Noncompliance with medications   Subtherapeutic serum dilantin level   Seizure (HCC)        Consultants:  None  Procedures:  None  Antibiotics: Anti-infectives (From admission, onward)   None       Time spent: 20 minutes    Junious Silk ANP  [facility] Pager [phone]. If 7PM-7AM, please contact night-coverage at [email or web address] 03/19/2020, 10:14 AM  LOS: 25 days

## 2020-03-19 NOTE — TOC Progression Note (Signed)
LATE NOTE SUBMISSION    Transition of Care [facility]) - Progression Note    Patient Details  Name: Jim Dunn MRN: [id number] Date of Birth: April 10, 1966  Transition of Care [facility]) CM/SW Contact  Baldemar Lenis, [place] Phone Number: 03/19/2020, 2:57 PM  Clinical Narrative:   CSW spoke with Basil Dess with [facility], provided update on lack of progress with group home placements. Camella suggested looking into ALF. CSW contacted [facility] and [facility], sent referrals to both of them. CSW to follow.    Expected Discharge Plan: Assisted Living Barriers to Discharge: Continued Medical Work up, Unsafe home situation  Expected Discharge Plan and Services Expected Discharge Plan: Assisted Living       Living arrangements for the past 2 months: Single Family Home                                       Social Determinants of Health (SDOH) Interventions    Readmission Risk Interventions Readmission Risk Prevention Plan 01/23/2020  Transportation Screening Complete  HRI or Home Care Consult Complete  Social Work Consult for Recovery Care Planning/Counseling Complete  Palliative Care Screening Not Applicable  Medication Review Oceanographer) (No Data)

## 2020-03-19 NOTE — Progress Notes (Signed)
RT NOTE:  Pt able to manage CPAP when he is ready to put on.

## 2020-03-19 NOTE — TOC Progression Note (Signed)
LATE NOTE SUBMISSION   Transition of Care [facility]) - Progression Note    Patient Details  Name: Jim Dunn MRN: [id number] Date of Birth: May 30, 1966  Transition of Care [facility]) CM/SW Contact  Baldemar Lenis, [place] Phone Number: 03/19/2020, 2:52 PM  Clinical Narrative:   CSW worked to find group home placement for the patient. CSW contacted [facility], spoke with director and they have a bed available. CSW sent referral, waiting on response. CSW spoke with [facility], and they have no beds available.     Expected Discharge Plan: Assisted Living Barriers to Discharge: Continued Medical Work up, Unsafe home situation  Expected Discharge Plan and Services Expected Discharge Plan: Assisted Living       Living arrangements for the past 2 months: Single Family Home                                       Social Determinants of Health (SDOH) Interventions    Readmission Risk Interventions Readmission Risk Prevention Plan 01/23/2020  Transportation Screening Complete  HRI or Home Care Consult Complete  Social Work Consult for Recovery Care Planning/Counseling Complete  Palliative Care Screening Not Applicable  Medication Review Oceanographer) (No Data)

## 2020-03-19 NOTE — TOC Progression Note (Signed)
LATE NOTE SUBMISSION   Transition of Care [facility]) - Progression Note    Patient Details  Name: Jim Dunn MRN: [id number] Date of Birth: Jul 29, 1966  Transition of Care [facility]) CM/SW Contact  Baldemar Lenis, [place] Phone Number: 03/19/2020, 2:54 PM  Clinical Narrative:   CSW worked to find group home placement for patient. CSW attempted to contact [employer], number was not correct. CSW contacted [employer], was given a number to contact the main office. CSW spoke with an Production designer, theatre/television/film in the main [facility] office, discussed the patient needing group home placement. CSW was told that the patient would have to submit application through [facility] for mental health group home placement, that they only handled substance abuse group home placements.     Expected Discharge Plan: Assisted Living Barriers to Discharge: Continued Medical Work up, Unsafe home situation  Expected Discharge Plan and Services Expected Discharge Plan: Assisted Living       Living arrangements for the past 2 months: Single Family Home                                       Social Determinants of Health (SDOH) Interventions    Readmission Risk Interventions Readmission Risk Prevention Plan 01/23/2020  Transportation Screening Complete  HRI or Home Care Consult Complete  Social Work Consult for Recovery Care Planning/Counseling Complete  Palliative Care Screening Not Applicable  Medication Review Oceanographer) (No Data)

## 2020-03-20 ENCOUNTER — Other Ambulatory Visit: Payer: Self-pay

## 2020-03-20 LAB — BASIC METABOLIC PANEL
Anion gap: 9 (ref 5–15)
BUN: 9 mg/dL (ref 6–20)
CO2: 23 mmol/L (ref 22–32)
Calcium: 9.3 mg/dL (ref 8.9–10.3)
Chloride: 100 mmol/L (ref 98–111)
Creatinine, Ser: 0.51 mg/dL — ABNORMAL LOW (ref 0.61–1.24)
GFR calc Af Amer: >60 mL/min (ref >60)
GFR calc non Af Amer: >60 mL/min (ref >60)
Glucose, Bld: 101 mg/dL — ABNORMAL HIGH (ref 70–99)
Potassium: 4.1 mmol/L (ref 3.5–5.1)
Sodium: 132 mmol/L — ABNORMAL LOW (ref 135–145)

## 2020-03-20 MED ORDER — FUROSEMIDE 40 MG PO TABS
40.0000 mg | ORAL_TABLET | Freq: Every day | ORAL | Status: DC
  Administered 2020-03-20 – 2020-04-11 (×23): 40 mg via ORAL
  Filled 2020-03-20 (×22): qty 1

## 2020-03-20 NOTE — Plan of Care (Signed)
  Problem: Education: Goal: Expressions of having a comfortable level of knowledge regarding the disease process will increase Outcome: Progressing   Problem: Health Behavior/Discharge Planning: Goal: Compliance with prescribed medication regimen will improve Outcome: Progressing   Problem: Medication: Goal: Risk for medication side effects will decrease Outcome: Progressing   Problem: Clinical Measurements: Goal: Complications related to the disease process, condition or treatment will be avoided or minimized Outcome: Progressing Goal: Diagnostic test results will improve Outcome: Progressing   Problem: Safety: Goal: Verbalization of understanding the information provided will improve Outcome: Progressing   Problem: Self-Concept: Goal: Level of anxiety will decrease Outcome: Progressing Goal: Ability to verbalize feelings about condition will improve Outcome: Progressing   Problem: Education: Goal: Knowledge of General Education information will improve Description: Including pain rating scale, medication(s)/side effects and non-pharmacologic comfort measures Outcome: Progressing   Problem: Health Behavior/Discharge Planning: Goal: Ability to manage health-related needs will improve Outcome: Progressing   Problem: Clinical Measurements: Goal: Ability to maintain clinical measurements within normal limits will improve Outcome: Progressing Goal: Will remain free from infection Outcome: Progressing Goal: Diagnostic test results will improve Outcome: Progressing Goal: Respiratory complications will improve Outcome: Progressing Goal: Cardiovascular complication will be avoided Outcome: Progressing   Problem: Activity: Goal: Risk for activity intolerance will decrease Outcome: Progressing   Problem: Nutrition: Goal: Adequate nutrition will be maintained Outcome: Progressing   Problem: Coping: Goal: Level of anxiety will decrease Outcome: Progressing   Problem:  Elimination: Goal: Will not experience complications related to bowel motility Outcome: Progressing Goal: Will not experience complications related to urinary retention Outcome: Progressing   Problem: Pain Managment: Goal: General experience of comfort will improve Outcome: Progressing   Problem: Safety: Goal: Ability to remain free from injury will improve Outcome: Progressing   Problem: Skin Integrity: Goal: Risk for impaired skin integrity will decrease Outcome: Progressing

## 2020-03-20 NOTE — Progress Notes (Signed)
Discussed with Dr. Sharon Seller, ok to check Dilantin level in AM with albumin.  Rexford Maus, PharmD PGY-1 Acute Care Pharmacy Resident Office: [phone] 03/20/2020 1:49 PM

## 2020-03-20 NOTE — Progress Notes (Signed)
Jim Dunn  PJK:[id number] DOB: [birth year] DOA: 02/23/2020 PCP: System, Pcp Not In    Brief Narrative:  53 year old with a history of schizophrenia, seizure disorder, biventricular heart failure (EF 25-30% with right heart failure via TTE May 2021), severe tricuspid regurgitation, tobacco abuse, and COPD who presented to the [facility] ED reporting suicidal ideation and seizures.  He left the hospital [facility] 6/19 while being treated for hyponatremia and reported persistent fatigue since that time.  Upon return to the ED with these complaints he was noted to have suffered a seizure while in the waiting room.  ED evaluation revealed a sodium of 117, potassium of 2.6, and magnesium of 1.4.   Antimicrobials:  None presently  Subjective: Awaiting placement in an assisted living facility pending Medicaid and a bed offer.  Resting comfortably at the time of my visit.  Assessment & Plan:  Hyponatremia Felt to have been due to use of diuretics and volume depletion -now euvolemic with stable sodium  Acute seizure with history of seizure disorder Likely precipitated by severe hyponatremia in setting of noncompliance with medications -continue Dilantin  Suicidal ideation -major depression Evaluated by psychiatry who recommended BuSpar, Neurontin, Invega -to follow-up as an outpatient with psychiatry -received monthly dose of Invega 7/8 with next due 8/8  Chronic systolic congestive heart failure and right heart failure EF 25% via TTE May 2021 -continue Coreg and Entresto -diuretic on hold as above -with mild lower extremity edema initiate Lasix and follow  COPD Well compensated  HLD Continue Lipitor  Prolonged QTC Appears to have resolved -limits ability to use antipsychotics  Chronic peripheral neuropathy Continue Neurontin  Overweight - Body mass index is 30.64 kg/m.    DVT prophylaxis: Refusing Lovenox consistently Code Status: FULL CODE Family Communication:  Status is:  Inpatient  Remains inpatient appropriate because:Unsafe d/c plan   Dispo: The patient is from: Home              Anticipated d/c is to: ALF              Anticipated d/c date is: 1 day              Patient currently is medically stable to d/c.   Consultants:  Psychiatry  Objective: Blood pressure 115/73, pulse 89, temperature 99.2 F (37.3 C), temperature source Oral, resp. rate 16, weight 91.4 kg, SpO2 97 %. No intake or output data in the 24 hours ending 03/20/20 0952 Filed Weights   03/13/20 0300 03/15/20 0451 03/20/20 0452  Weight: 89.4 kg 92.5 kg 91.4 kg    Examination: General: No acute respiratory distress Lungs: Clear to auscultation bilaterally  Cardiovascular: RRR Abdomen: Bowel sounds positive, no mass Extremities: Trace bilateral lower extremity edema  CBC: No results for input(s): WBC, NEUTROABS, HGB, HCT, MCV, PLT in the last 168 hours.  Basic Metabolic Panel: Recent Labs  Lab 03/14/20 0029 03/17/20 0117 03/20/20 0604  NA 130* 135 132*  K 4.4 4.4 4.1  CL 98 100 100  CO2 24 25 23   GLUCOSE 96 100* 101*  BUN 10 10 9   CREATININE 0.48* 0.49* 0.51*  CALCIUM 9.2 9.6 9.3  MG 1.8  --   --    GFR: Estimated Creatinine Clearance: 118.6 mL/min (A) (by C-G formula based on SCr of 0.51 mg/dL (L)).  Liver Function Tests: No results for input(s): AST, ALT, ALKPHOS, BILITOT, PROT, ALBUMIN in the last 168 hours. No results for input(s): LIPASE, AMYLASE in the last 168 hours. No results for input(s):  AMMONIA in the last 168 hours.  Coagulation Profile: No results for input(s): INR, PROTIME in the last 168 hours.  Cardiac Enzymes: No results for input(s): CKTOTAL, CKMB, CKMBINDEX, TROPONINI in the last 168 hours.  HbA1C: No results found for: HGBA1C  CBG: No results for input(s): GLUCAP in the last 168 hours.  No results found for this or any previous visit (from the past 240 hour(s)).   Scheduled Meds: . atorvastatin  40 mg Oral Daily  . busPIRone   10 mg Oral TID  . carvedilol  3.125 mg Oral BID WC  . digoxin  0.125 mg Oral Daily  . enoxaparin (LOVENOX) injection  40 mg Subcutaneous Daily  . fluticasone  1 spray Each Nare Daily  . fluticasone furoate-vilanterol  1 puff Inhalation Daily  . furosemide  20 mg Intravenous Once  . gabapentin  300 mg Oral QID  . melatonin  6 mg Oral QHS  . phenytoin  100 mg Oral TID  . QUEtiapine  200 mg Oral Daily  . sacubitril-valsartan  1 tablet Oral BID     LOS: 26 days   Lonia Blood, MD [facility] Office  [phone] Pager - Text Page per Amion  If 7PM-7AM, please contact night-coverage per Amion 03/20/2020, 9:52 AM

## 2020-03-21 LAB — COMPREHENSIVE METABOLIC PANEL
ALT: 31 U/L (ref 0–44)
AST: 28 U/L (ref 15–41)
Albumin: 3.7 g/dL (ref 3.5–5.0)
Alkaline Phosphatase: 222 U/L — ABNORMAL HIGH (ref 38–126)
Anion gap: 10 (ref 5–15)
BUN: 9 mg/dL (ref 6–20)
CO2: 25 mmol/L (ref 22–32)
Calcium: 9.6 mg/dL (ref 8.9–10.3)
Chloride: 97 mmol/L — ABNORMAL LOW (ref 98–111)
Creatinine, Ser: 0.49 mg/dL — ABNORMAL LOW (ref 0.61–1.24)
GFR calc Af Amer: >60 mL/min (ref >60)
GFR calc non Af Amer: >60 mL/min (ref >60)
Glucose, Bld: 99 mg/dL (ref 70–99)
Potassium: 4.2 mmol/L (ref 3.5–5.1)
Sodium: 132 mmol/L — ABNORMAL LOW (ref 135–145)
Total Bilirubin: 0.2 mg/dL — ABNORMAL LOW (ref 0.3–1.2)
Total Protein: 7.4 g/dL (ref 6.5–8.1)

## 2020-03-21 LAB — PHENYTOIN LEVEL, TOTAL: Phenytoin Lvl: 2.6 ug/mL — ABNORMAL LOW (ref 10.0–20.0)

## 2020-03-21 LAB — DIGOXIN LEVEL: Digoxin Level: <0.2 ng/mL — ABNORMAL LOW (ref 0.8–2.0)

## 2020-03-21 LAB — MAGNESIUM: Magnesium: 1.9 mg/dL (ref 1.7–2.4)

## 2020-03-21 MED ORDER — ACETAMINOPHEN 325 MG PO TABS
650.0000 mg | ORAL_TABLET | ORAL | Status: DC | PRN
  Administered 2020-03-21 – 2020-05-25 (×52): 650 mg via ORAL
  Filled 2020-03-21 (×54): qty 2

## 2020-03-21 MED ORDER — PHENYTOIN SODIUM EXTENDED 100 MG PO CAPS
200.0000 mg | ORAL_CAPSULE | Freq: Two times a day (BID) | ORAL | Status: DC
  Administered 2020-03-21 – 2020-03-28 (×15): 200 mg via ORAL
  Filled 2020-03-21 (×15): qty 2

## 2020-03-21 NOTE — Progress Notes (Signed)
Patient has been on phenytoin outpatient for seizures.    Levels drawn this AM:  -Phenytoin 2.6 -Albumin 3.7 -Corrected phenytoin 3.1  Spoke with Dr. Sharon Seller regarding dose, increased to phenytoin 200mg  BID.  Patient is currently not seizing, does not warrant a bolus dose at this time.  , PharmD PGY-1 Acute Care Pharmacy Resident Office: [phone] 03/21/2020 7:52 AM

## 2020-03-21 NOTE — Progress Notes (Signed)
   03/21/20 2152  Provider Notification  Provider Name/Title Dr Leafy Half  Date Provider Notified 03/21/20  Time Provider Notified 2143  Notification Type Page  Notification Reason Other (Comment) (Pt c/o of Bil feet pain)  Response See new orders  Date of Provider Response 03/21/20  Time of Provider Response 2152

## 2020-03-21 NOTE — Progress Notes (Signed)
Jim Dunn  ZOX:[id number] DOB: 1966-03-17 DOA: 02/23/2020 PCP: System, Pcp Not In    Brief Narrative:  54 year old with a history of schizophrenia, seizure disorder, biventricular heart failure (EF 25-30% with right heart failure via TTE May 2021), severe tricuspid regurgitation, tobacco abuse, and COPD who presented to the [facility] ED reporting suicidal ideation and seizures.  He left the hospital [facility] 6/19 while being treated for hyponatremia and reported persistent fatigue since that time.  Upon return to the ED with these complaints he was noted to have suffered a seizure while in the waiting room.  ED evaluation revealed a sodium of 117, potassium of 2.6, and magnesium of 1.4.  Antimicrobials:  None presently  Subjective: Awaiting placement in an assisted living facility pending Medicaid and a bed offer.  Sitting up in a bedside chair eating lunch.  Has no complaints.  Assessment & Plan:  Hyponatremia Initially felt to have been due to use of diuretics and volume depletion - now euvolemic with stable sodium - avoid metolazone - suspect his baseline sodium will chronically be 129-134 - follow intermittently with use of Lasix  Acute seizure with history of seizure disorder Likely precipitated by severe hyponatremia in setting of noncompliance with medications - continue Dilantin with dose increased due to level being well below therapeutic range  Suicidal ideation - major depression Evaluated by psychiatry who recommended BuSpar, Neurontin, Invega - to follow-up as an outpatient with psychiatry -received monthly dose of Invega 7/8 with next due 8/8  Chronic systolic congestive heart failure and right heart failure EF 25% via TTE May 2021 - continue Coreg and Entresto - with mild lower extremity edema Lasix was initiated in place of chronic torsemide - digoxin level not elevated -no gross volume overload on exam today  COPD Well compensated  HLD Continue Lipitor  Prolonged QTC Appears  to have resolved -limits ability to use antipsychotics  Chronic peripheral neuropathy Continue Neurontin  Overweight - Body mass index is 30.64 kg/m.    DVT prophylaxis: Refusing Lovenox consistently Code Status: FULL CODE Family Communication: No family present at time of exam Status is: Inpatient  Remains inpatient appropriate because:Unsafe d/c plan   Dispo: The patient is from: Home              Anticipated d/c is to: ALF              Anticipated d/c date is: 1 day              Patient currently is medically stable to d/c.   Consultants:  Psychiatry  Objective: Blood pressure 112/68, pulse 88, temperature 98.8 F (37.1 C), temperature source Oral, resp. rate 16, height 5\' 8"  (1.727 m), weight 91.4 kg, SpO2 98 %.  Intake/Output Summary (Last 24 hours) at 03/21/2020 0929 Last data filed at 03/21/2020 0809 Gross per 24 hour  Intake 420 ml  Output --  Net 420 ml   Filed Weights   03/15/20 0451 03/20/20 0452 03/20/20 1251  Weight: 92.5 kg 91.4 kg 91.4 kg    Examination: General: No acute respiratory distress Lungs: Clear to auscultation bilaterally without wheezing Cardiovascular: RRR Abdomen: Bowel sounds positive, no mass Extremities: No significant lower extremity edema today  CBC: No results for input(s): WBC, NEUTROABS, HGB, HCT, MCV, PLT in the last 168 hours.  Basic Metabolic Panel: Recent Labs  Lab 03/17/20 0117 03/20/20 0604 03/21/20 0511  NA 135 132* 132*  K 4.4 4.1 4.2  CL 100 100 97*  CO2 25  23 25  GLUCOSE 100* 101* 99  BUN 10 9 9   CREATININE 0.49* 0.51* 0.49*  CALCIUM 9.6 9.3 9.6  MG  --   --  1.9   GFR: Estimated Creatinine Clearance: 118.6 mL/min (A) (by C-G formula based on SCr of 0.49 mg/dL (L)).  Liver Function Tests: Recent Labs  Lab 03/21/20 0511  AST 28  ALT 31  ALKPHOS 222*  BILITOT 0.2*  PROT 7.4  ALBUMIN 3.7   No results for input(s): LIPASE, AMYLASE in the last 168 hours. No results for input(s): AMMONIA in the  last 168 hours.  Coagulation Profile: No results for input(s): INR, PROTIME in the last 168 hours.  Cardiac Enzymes: No results for input(s): CKTOTAL, CKMB, CKMBINDEX, TROPONINI in the last 168 hours.  HbA1C: No results found for: HGBA1C  CBG: No results for input(s): GLUCAP in the last 168 hours.  No results found for this or any previous visit (from the past 240 hour(s)).   Scheduled Meds: . atorvastatin  40 mg Oral Daily  . busPIRone  10 mg Oral TID  . carvedilol  3.125 mg Oral BID WC  . digoxin  0.125 mg Oral Daily  . enoxaparin (LOVENOX) injection  40 mg Subcutaneous Daily  . fluticasone furoate-vilanterol  1 puff Inhalation Daily  . furosemide  40 mg Oral Daily  . gabapentin  300 mg Oral QID  . melatonin  6 mg Oral QHS  . phenytoin  200 mg Oral BID  . QUEtiapine  200 mg Oral Daily  . sacubitril-valsartan  1 tablet Oral BID     LOS: 27 days   03/23/20, MD [facility] Office  [phone] Pager - Text Page per Amion  If 7PM-7AM, please contact night-coverage per Amion 03/21/2020, 9:29 AM

## 2020-03-22 NOTE — Progress Notes (Addendum)
[facility] PROGRESS NOTE  Jim Dunn PJA:[id number] DOB: [birth year] DOA: 02/23/2020 PCP: System, Pcp Not In  Status: Inpatient---Remains inpatient appropriate because:Altered mental status and Unsafe d/c plan   Dispo: The patient is from: Home              Anticipated d/c is to: ALF              Anticipated d/c date is: > 3 days              Patient currently is medically stable to d/c.  Currently awaiting placement, TOC team working on this.  Mother is currently working to obtain custodial/guardianship.  Unsafe for discharge  Code Status: Full Family Communication: 7/15 updated [name] by telephone [phone] DVT prophylaxis: Lovenox   HPI: 54 year old male with past medical history of schizophrenia, seizure disorder, biventricular heart failure(Echo 01/2020 EF 25-30%with right-sided heart failure),severe tricuspid regurgitation, nicotine dependence and COPD who presents to [facility] emergency department with suicidal ideation and seizure.Patient explains that since he left the hospital several days ago(admitted6/17-6/19-was being managed for hyponatremia, thought to be multifactorial in origin. Patient left AMA on6/19 and that time had sodium of 132),he has been experiencing increasing fatigue. This increasing fatigue is associated with bilateral lower extremity tingling. Symptomsweresimilar to what prompted his presentation on 6/17. Additionally, he reported feeling depressed withsuicidal ideation, withouta plan. Patient reportedthat he hadattempted suicide in the distant past using pills but was unsuccessful and never sought medical attention. While the patient was waiting in the waiting room, he experienced a witnessed seizure while attempting to walk to the bathroom. Patient was immediately brought back to the emergency department for further evaluation. Patient was found to have substantial recurrent hyponatremia of 117 with concurrent  hypokalemia of 2.6 and hypomagnesium of 1.4. Patient was administered 1236mg  of intravenous Fosphenytoinand initiated on low rate normal saline infusion. Patient was administered 2 g of intravenous magnesium and a 30 mEq of potassium.  Patient's electrolytes have improved, no further seizure episodes at this time.  Echo showed EF of 25-30%, cardiology recommended outpatient follow-up but otherwise continue Coreg, torsemide and Entresto.    Subjective: Ambulates easily in room and hallway No shortness of breath or chest pain, denies orthopnea  Objective: Vitals:   03/22/20 0321 03/22/20 0900  BP: 109/76 114/69  Pulse: 84 81  Resp: 18 16  Temp: 98.7 F (37.1 C) 98.3 F (36.8 C)  SpO2: 100% 100%   No intake or output data in the 24 hours ending 03/22/20 1307 Filed Weights   03/20/20 0452 03/20/20 1251 03/22/20 0628  Weight: 91.4 kg 91.4 kg 91.4 kg    Exam:  Constitutional: NAD, calm, comfortable Respiratory: clear to auscultation bilaterally, Normal respiratory effort.  Room air Cardiovascular: Regular rate and rhythm, no murmurs / rubs / gallops. No extremity edema.  No JVD.  2+ pedal pulses.  Abdomen: no tenderness, no masses palpated.  Bowel sounds positive.  Tolerating diet without difficulty Musculoskeletal: no clubbing / cyanosis. No joint deformity upper and lower extremities. Good ROM, no contractures. Normal muscle tone.  Skin: no rashes, lesions, ulcers. No induration Neurologic: CN 2-12 grossly intact. Sensation intact, DTR normal. Strength 5/5 x all 4 extremities. Ambulates independently Psychiatric: Alert and oriented x 3 although does not have appropriate insight or capacity to manage care independently secondary to longstanding schizophrenia. Normal mood.    Assessment/Plan:  Hyponatremia with seizures: Intravascular dehydration, resolved Held home diuretic diuretics- torsemide, metolazone discontinued.   Na+ 135 on  7/14- ck lytes prn   Currently appears  euvolemic  Chronic congestive heart failure with reduced ejection fraction, 25%./Associated right heart failure Euvolemic for several days but developed trace edema over the weekend Attending physician opted to place patient on low-dose Lasix-sodium has decreased to 132.  Patient reports increased urinary output.  Continue low-dose Lasix for now and repeat electrolyte panel on 7/21 Echo in May 2021-EF 25-30% Continue Coreg, Entresto.   Daily weights with strict I's/O On heart healthy diet without fluid restriction Digoxin level subtherapeutic at <0.02 therefore dose increased to 0.125 mg daily Patient again instructed to minimize salt intake but is allowed to have one snack size bag of chips such as Fritos once per week, encouraged to sit recliner with feet elevated   Depression, Suicidal ideations: Resolved, seen by psychiatry.  Recommended to continue BuSpar, Neurontin, Invega.   Follow-up outpatient psychiatry.   Received his routine Invega shot 7/8, takes this monthly-next dose due 8/8  Seizure disorder: Stable Secondary to hyponatremia and issues with noncompliance with medications prior to admission.   Dilantin level subtherapeutic on 7/18 at 2.6 - Dilantin dosage increased to 200 mg twice daily Plan repeat level in 1 week  Microcytic anemia Iron studies relatively normal, hemoglobin stable  COPD: stable. As needed bronchodilators  Hyperlipidemia: Daily Lipitor 40 mg  Hypokalemia/hypomagnesemia Replace as needed  Prolonged QTC:  Appears to have resolved.   Can you Seroquel 200 mg daily.  Neuropathy Currently on gabapentin 300 mg 3 TID   On 7/14 added a bedtime dose of 300 mg to see if improves patient's neuropathic foot pain  Modest obesity Estimated body mass index is 30.64 kg/m as calculated from the following:   Height as of this encounter: 5\' 8"  (1.727 m).   Weight as of this encounter: 91.4 kg.  **Okay to leave IV line out at patient's request, no  acute issues ongoing at this time.    Data Reviewed: Basic Metabolic Panel: Recent Labs  Lab 03/17/20 0117 03/20/20 0604 03/21/20 0511  NA 135 132* 132*  K 4.4 4.1 4.2  CL 100 100 97*  CO2 25 23 25   GLUCOSE 100* 101* 99  BUN 10 9 9   CREATININE 0.49* 0.51* 0.49*  CALCIUM 9.6 9.3 9.6  MG  --   --  1.9   Liver Function Tests: Recent Labs  Lab 03/21/20 0511  AST 28  ALT 31  ALKPHOS 222*  BILITOT 0.2*  PROT 7.4  ALBUMIN 3.7   No results for input(s): LIPASE, AMYLASE in the last 168 hours. No results for input(s): AMMONIA in the last 168 hours. CBC: No results for input(s): WBC, NEUTROABS, HGB, HCT, MCV, PLT in the last 168 hours. Cardiac Enzymes: No results for input(s): CKTOTAL, CKMB, CKMBINDEX, TROPONINI in the last 168 hours. BNP (last 3 results) Recent Labs    01/22/20 0410 02/24/20 0030  BNP 697.9* 184.7*    ProBNP (last 3 results) No results for input(s): PROBNP in the last 8760 hours.  CBG: No results for input(s): GLUCAP in the last 168 hours.  No results found for this or any previous visit (from the past 240 hour(s)).   Studies: No results found.  Scheduled Meds: . atorvastatin  40 mg Oral Daily  . busPIRone  10 mg Oral TID  . carvedilol  3.125 mg Oral BID WC  . digoxin  0.125 mg Oral Daily  . enoxaparin (LOVENOX) injection  40 mg Subcutaneous Daily  . fluticasone furoate-vilanterol  1 puff Inhalation Daily  .  furosemide  40 mg Oral Daily  . gabapentin  300 mg Oral QID  . melatonin  6 mg Oral QHS  . phenytoin  200 mg Oral BID  . QUEtiapine  200 mg Oral Daily  . sacubitril-valsartan  1 tablet Oral BID   Continuous Infusions:  Principal Problem:   Hyponatremia Active Problems:   Chronic systolic CHF (congestive heart failure) (HCC)   Mixed hyperlipidemia   COPD (chronic obstructive pulmonary disease) (HCC)   Seizure disorder (HCC)   Paranoid schizophrenia (HCC)   Hypokalemia   Hypomagnesemia   Suicidal ideation   Noncompliance  with medications   Subtherapeutic serum dilantin level   Seizure (HCC)        Consultants:  None  Procedures:  None  Antibiotics: Anti-infectives (From admission, onward)   None       Time spent: 20 minutes    Junious Silk ANP  [facility] Pager [phone]. If 7PM-7AM, please contact night-coverage at [email or web address] 03/22/2020, 1:07 PM  LOS: 28 days

## 2020-03-23 NOTE — Progress Notes (Signed)
Patient stated he would place himself on CPAP when he was ready. Informed patient if he needed help to have RN to call RT.

## 2020-03-23 NOTE — Progress Notes (Signed)
[facility] PROGRESS NOTE  Jim Dunn EVO:[id number] DOB: Oct 07, 1965 DOA: 02/23/2020 PCP: System, Pcp Not In  Status: Inpatient---Remains inpatient appropriate because:Altered mental status and Unsafe d/c plan   Dispo: The patient is from: Home              Anticipated d/c is to: ALF              Anticipated d/c date is: > 3 days              Patient currently is medically stable to d/c.  Currently awaiting placement, TOC team working on this.  Mother is currently working to obtain custodial/guardianship.  Unsafe for discharge  Code Status: Full Family Communication: 7/15 updated [name] by telephone [phone] DVT prophylaxis: Lovenox   HPI: 54 year old male with past medical history of schizophrenia, seizure disorder, biventricular heart failure(Echo 01/2020 EF 25-30%with right-sided heart failure),severe tricuspid regurgitation, nicotine dependence and COPD who presents to [facility] emergency department with suicidal ideation and seizure.Patient explains that since he left the hospital several days ago(admitted6/17-6/19-was being managed for hyponatremia, thought to be multifactorial in origin. Patient left AMA on6/19 and that time had sodium of 132),he has been experiencing increasing fatigue. This increasing fatigue is associated with bilateral lower extremity tingling. Symptomsweresimilar to what prompted his presentation on 6/17. Additionally, he reported feeling depressed withsuicidal ideation, withouta plan. Patient reportedthat he hadattempted suicide in the distant past using pills but was unsuccessful and never sought medical attention. While the patient was waiting in the waiting room, he experienced a witnessed seizure while attempting to walk to the bathroom. Patient was immediately brought back to the emergency department for further evaluation. Patient was found to have substantial recurrent hyponatremia of 117 with concurrent  hypokalemia of 2.6 and hypomagnesium of 1.4. Patient was administered 1236mg  of intravenous Fosphenytoinand initiated on low rate normal saline infusion. Patient was administered 2 g of intravenous magnesium and a 30 mEq of potassium.  Patient's electrolytes have improved, no further seizure episodes at this time.  Echo showed EF of 25-30%, cardiology recommended outpatient follow-up but otherwise continue Coreg, torsemide and Entresto.    Subjective: Ambulates easily in room and hallway Primarily complaining of boredom Discussed with staff who are in agreement with obtaining order to allow patient to leave the unit prn accompanied by staff   Objective: Vitals:   03/23/20 0819 03/23/20 0836  BP: 119/85   Pulse: 88   Resp: 18   Temp: 98.3 F (36.8 C)   SpO2: 99% 99%    Intake/Output Summary (Last 24 hours) at 03/23/2020 1141 Last data filed at 03/23/2020 0830 Gross per 24 hour  Intake 240 ml  Output --  Net 240 ml   Filed Weights   03/20/20 0452 03/20/20 1251 03/22/20 0628  Weight: 91.4 kg 91.4 kg 91.4 kg    Exam:  Constitutional: NAD, calm, comfortable-reports excessive boredom Respiratory: clear to auscultation bilaterally, Normal respiratory effort.  Room air Cardiovascular: Regular rate and rhythm, no murmurs / rubs / gallops. No extremity edema.  No JVD.  2+ pedal pulses.  Abdomen: no tenderness, no masses palpated.  Bowel sounds positive.   Musculoskeletal: no clubbing / cyanosis. No joint deformity upper and lower extremities. Good ROM, no contractures. Normal muscle tone.  Skin: no rashes, lesions, ulcers. No induration Neurologic: CN 2-12 grossly intact. Sensation intact, DTR normal. Strength 5/5 x all 4 extremities. Ambulates independently Psychiatric: Alert and oriented x 3 although does not have appropriate insight or capacity  to manage care independently secondary to longstanding schizophrenia. Normal mood.    Assessment/Plan:  Hyponatremia with  seizures: Intravascular dehydration, resolved Nisha held home diuretic diuretics- (torsemide, metolazone) and these were subsequently discontinued.   Na+ 135 on 7/14-with slight decrease to 132 with initiation of Lasix Euvolemic  Chronic congestive heart failure with reduced ejection fraction, 25%./Associated right heart failure Euvolemic for several days but developed trace edema over the weekend Attending physician opted to place patient on low-dose Lasix-sodium has decreased to 132.  Patient reports increased urinary output.  Continue low-dose Lasix -sodium remained stable and edema has resolved Echo in May 2021-EF 25-30% Continue Coreg, Entresto.   Daily weights with strict I's/O On heart healthy diet without fluid restriction Digoxin level subtherapeutic at <0.02 therefore dose increased to 0.125 mg daily Patient again instructed to minimize salt intake but is allowed to have one snack size bag of chips such as Fritos once per week, encouraged to sit recliner with feet elevated   Depression, Suicidal ideations: Resolved, seen by psychiatry.  Recommended to continue BuSpar, Neurontin, Invega.   Follow-up outpatient psychiatry.   Received his routine Invega shot 7/8, takes this monthly-next dose due 8/8 **Patient reporting bordom-plan is to patient to periodically leave unit accompanied by staff he may walk outside and walk around hospital short periods  Seizure disorder: Stable Secondary to hyponatremia and issues with noncompliance with medications prior to admission.   Dilantin level subtherapeutic on 7/18 at 2.6 - Dilantin dosage increased to 200 mg twice daily Plan repeat level in 1 week  Microcytic anemia Iron studies relatively normal, hemoglobin stable  COPD: stable. As needed bronchodilators  Hyperlipidemia: Daily Lipitor 40 mg  Hypokalemia/hypomagnesemia Replace as needed  Prolonged QTC:  Appears to have resolved.   Can you Seroquel 200 mg  daily.  Neuropathy Currently on gabapentin 300 mg 3 TID   On 7/14 added a bedtime dose of 300 mg to see if improves patient's neuropathic foot pain  Modest obesity Estimated body mass index is 30.64 kg/m as calculated from the following:   Height as of this encounter: 5\' 8"  (1.727 m).   Weight as of this encounter: 91.4 kg.  **Okay to leave IV line out at patient's request, no acute issues ongoing at this time.    Data Reviewed: Basic Metabolic Panel: Recent Labs  Lab 03/17/20 0117 03/20/20 0604 03/21/20 0511  NA 135 132* 132*  K 4.4 4.1 4.2  CL 100 100 97*  CO2 25 23 25   GLUCOSE 100* 101* 99  BUN 10 9 9   CREATININE 0.49* 0.51* 0.49*  CALCIUM 9.6 9.3 9.6  MG  --   --  1.9   Liver Function Tests: Recent Labs  Lab 03/21/20 0511  AST 28  ALT 31  ALKPHOS 222*  BILITOT 0.2*  PROT 7.4  ALBUMIN 3.7   No results for input(s): LIPASE, AMYLASE in the last 168 hours. No results for input(s): AMMONIA in the last 168 hours. CBC: No results for input(s): WBC, NEUTROABS, HGB, HCT, MCV, PLT in the last 168 hours. Cardiac Enzymes: No results for input(s): CKTOTAL, CKMB, CKMBINDEX, TROPONINI in the last 168 hours. BNP (last 3 results) Recent Labs    01/22/20 0410 02/24/20 0030  BNP 697.9* 184.7*    ProBNP (last 3 results) No results for input(s): PROBNP in the last 8760 hours.  CBG: No results for input(s): GLUCAP in the last 168 hours.  No results found for this or any previous visit (from the past 240 hour(s)).  Studies: No results found.  Scheduled Meds: . atorvastatin  40 mg Oral Daily  . busPIRone  10 mg Oral TID  . carvedilol  3.125 mg Oral BID WC  . digoxin  0.125 mg Oral Daily  . enoxaparin (LOVENOX) injection  40 mg Subcutaneous Daily  . fluticasone furoate-vilanterol  1 puff Inhalation Daily  . furosemide  40 mg Oral Daily  . gabapentin  300 mg Oral QID  . melatonin  6 mg Oral QHS  . phenytoin  200 mg Oral BID  . QUEtiapine  200 mg Oral Daily   . sacubitril-valsartan  1 tablet Oral BID   Continuous Infusions:  Principal Problem:   Hyponatremia Active Problems:   Chronic systolic CHF (congestive heart failure) (HCC)   Mixed hyperlipidemia   COPD (chronic obstructive pulmonary disease) (HCC)   Seizure disorder (HCC)   Paranoid schizophrenia (HCC)   Hypokalemia   Hypomagnesemia   Suicidal ideation   Noncompliance with medications   Subtherapeutic serum dilantin level   Seizure (HCC)        Consultants:  None  Procedures:  None  Antibiotics: Anti-infectives (From admission, onward)   None       Time spent: 20 minutes    Junious Silk ANP  [facility] Pager [phone]. If 7PM-7AM, please contact night-coverage at [email or web address] 03/23/2020, 11:41 AM  LOS: 29 days

## 2020-03-24 LAB — BASIC METABOLIC PANEL
Anion gap: 9 (ref 5–15)
BUN: 10 mg/dL (ref 6–20)
CO2: 26 mmol/L (ref 22–32)
Calcium: 9.4 mg/dL (ref 8.9–10.3)
Chloride: 98 mmol/L (ref 98–111)
Creatinine, Ser: 0.53 mg/dL — ABNORMAL LOW (ref 0.61–1.24)
GFR calc Af Amer: >60 mL/min (ref >60)
GFR calc non Af Amer: >60 mL/min (ref >60)
Glucose, Bld: 88 mg/dL (ref 70–99)
Potassium: 4 mmol/L (ref 3.5–5.1)
Sodium: 133 mmol/L — ABNORMAL LOW (ref 135–145)

## 2020-03-24 NOTE — Plan of Care (Signed)
progressing 

## 2020-03-24 NOTE — Progress Notes (Signed)
Pt ref CPAP for the night. At bedside if pt changes mind. RT to cont to mont

## 2020-03-24 NOTE — Progress Notes (Signed)
[facility] PROGRESS NOTE  Jim Dunn WER:[id number] DOB: May 05, 1966 DOA: 02/23/2020 PCP: System, Pcp Not In  Status: Inpatient---Remains inpatient appropriate because:Altered mental status and Unsafe d/c plan   Dispo: The patient is from: Home              Anticipated d/c is to: ALF              Anticipated d/c date is: > 3 days              Patient currently is medically stable to d/c.  Currently awaiting placement, TOC team working on this.  Mother is currently working to obtain Medicaid/guardianship.  As of 7/21 she has submitted all required paperwork to [facility] for [facility] in another county but needs clarification regarding which [place] he should actually apply for. Unsafe for discharge based on underlying schizophrenia and history of recurrent noncompliance was which led to patient's current hospitalization  Code Status: Full Family Communication: 7/21 updated [name] by telephone [phone] DVT prophylaxis: Lovenox   HPI: 54 year old male with past medical history of schizophrenia, seizure disorder, biventricular heart failure(Echo 01/2020 EF 25-30%with right-sided heart failure),severe tricuspid regurgitation, nicotine dependence and COPD who presents to [facility] emergency department with suicidal ideation and seizure.Patient explains that since he left the hospital several days ago(admitted6/17-6/19-was being managed for hyponatremia, thought to be multifactorial in origin. Patient left AMA on6/19 and that time had sodium of 132),he has been experiencing increasing fatigue. This increasing fatigue is associated with bilateral lower extremity tingling. Symptomsweresimilar to what prompted his presentation on 6/17. Additionally, he reported feeling depressed withsuicidal ideation, withouta plan. Patient reportedthat he hadattempted suicide in the distant past using pills but was unsuccessful and never sought medical attention.  While the patient was waiting in the waiting room, he experienced a witnessed seizure while attempting to walk to the bathroom. Patient was immediately brought back to the emergency department for further evaluation. Patient was found to have substantial recurrent hyponatremia of 117 with concurrent hypokalemia of 2.6 and hypomagnesium of 1.4. Patient was administered 1236mg  of intravenous Fosphenytoinand initiated on low rate normal saline infusion. Patient was administered 2 g of intravenous magnesium and a 30 mEq of potassium.  Patient's electrolytes have improved, no further seizure episodes at this time.  Echo showed EF of 25-30%, cardiology recommended outpatient follow-up but otherwise continue Coreg, torsemide and Entresto.    Subjective: Appreciated time off unit yesterday. Is interested in obtaining DVD player and movies if an option Chest pain or shortness of breath  Objective: Vitals:   03/24/20 0823 03/24/20 1135  BP:  112/69  Pulse:  77  Resp:  16  Temp:  98.8 F (37.1 C)  SpO2: 98% 99%    Intake/Output Summary (Last 24 hours) at 03/24/2020 1227 Last data filed at 03/23/2020 1331 Gross per 24 hour  Intake 360 ml  Output --  Net 360 ml   Filed Weights   03/20/20 1251 03/22/20 0628 03/24/20 0914  Weight: 91.4 kg 91.4 kg 92.5 kg    Exam:  Constitutional: NAD, calm, comfortable Respiratory: clear to auscultation bilaterally, Normal respiratory effort.  Room air Cardiovascular: Regular rate and rhythm, no murmurs / rubs / gallops. No extremity edema.  No JVD.  2+ pedal pulses.  Abdomen: no tenderness, no masses palpated.  Bowel sounds positive.  Tolerating diet without difficulty Musculoskeletal: no clubbing / cyanosis. No joint deformity upper and lower extremities. Good ROM, no contractures. Normal muscle tone.  Skin: no rashes,  lesions, ulcers. No induration Neurologic: CN 2-12 grossly intact. Sensation intact, DTR normal. Strength 5/5 x all 4 extremities.  Ambulates independently Psychiatric: Alert and oriented x 3 although does not have appropriate insight or capacity to manage care independently secondary to longstanding schizophrenia. Normal mood.    Assessment/Plan:  Hyponatremia with seizures: Intravascular dehydration, resolved Held home diuretic diuretics- torsemide, metolazone discontinued.   Na+ 133 on 7/21-repeat again on 7/22 and if remains stable check prn Currently appears euvolemic  Chronic congestive heart failure with reduced ejection fraction, 25%.-Associated right heart failure Euvolemic for several days but developed trace edema for was initiated on low-dose Lasix-sodium decreased slightly but otherwise has remained stable.   Continue low-dose Lasix for now and repeat electrolyte panel on 7/21 Echo in May 2021-EF 25-30% Continue Coreg, Entresto.   Daily weights with strict I's/O On heart healthy diet without fluid restriction Digoxin level subtherapeutic at <0.02 therefore dose increased to 0.125 mg daily Patient again instructed to minimize salt intake but is allowed to have one snack size bag of chips such as Fritos once per week, encouraged to sit recliner with feet elevated   Depression, Suicidal ideations: Resolved, seen by psychiatry.  Recommended to continue BuSpar, Neurontin, Invega.   Follow-up outpatient psychiatry.   Received his routine Invega shot 7/8, takes this monthly-next dose due 8/8  Seizure disorder: Stable Secondary to hyponatremia and issues of nonadherence with medications prior to admission.   Dilantin level subtherapeutic on 7/18 at 2.6 - Dilantin dosage increased to 200 mg twice daily Plan repeat level in 1 week  Microcytic anemia Iron studies relatively normal, hemoglobin stable  COPD: stable. As needed bronchodilators  Hyperlipidemia: Daily Lipitor 40 mg  Hypokalemia/hypomagnesemia Replace as needed  Prolonged QTC:  Appears to have resolved.   Can you Seroquel 200  mg daily.  Neuropathy Currently on gabapentin 300 mg 3 TID   On 7/14 added a bedtime dose of 300 mg with improvement in discomfort  Modest obesity Estimated body mass index is 31 kg/m as calculated from the following:   Height as of this encounter: 5\' 8"  (1.727 m).   Weight as of this encounter: 92.5 kg.  **Okay to leave IV line out at patient's request, no acute issues ongoing at this time.    Data Reviewed: Basic Metabolic Panel: Recent Labs  Lab 03/20/20 0604 03/21/20 0511 03/24/20 0632  NA 132* 132* 133*  K 4.1 4.2 4.0  CL 100 97* 98  CO2 23 25 26   GLUCOSE 101* 99 88  BUN 9 9 10   CREATININE 0.51* 0.49* 0.53*  CALCIUM 9.3 9.6 9.4  MG  --  1.9  --    Liver Function Tests: Recent Labs  Lab 03/21/20 0511  AST 28  ALT 31  ALKPHOS 222*  BILITOT 0.2*  PROT 7.4  ALBUMIN 3.7   No results for input(s): LIPASE, AMYLASE in the last 168 hours. No results for input(s): AMMONIA in the last 168 hours. CBC: No results for input(s): WBC, NEUTROABS, HGB, HCT, MCV, PLT in the last 168 hours. Cardiac Enzymes: No results for input(s): CKTOTAL, CKMB, CKMBINDEX, TROPONINI in the last 168 hours. BNP (last 3 results) Recent Labs    01/22/20 0410 02/24/20 0030  BNP 697.9* 184.7*    ProBNP (last 3 results) No results for input(s): PROBNP in the last 8760 hours.  CBG: No results for input(s): GLUCAP in the last 168 hours.  No results found for this or any previous visit (from the past 240 hour(s)).  Studies: No results found.  Scheduled Meds: . atorvastatin  40 mg Oral Daily  . busPIRone  10 mg Oral TID  . carvedilol  3.125 mg Oral BID WC  . digoxin  0.125 mg Oral Daily  . enoxaparin (LOVENOX) injection  40 mg Subcutaneous Daily  . fluticasone furoate-vilanterol  1 puff Inhalation Daily  . furosemide  40 mg Oral Daily  . gabapentin  300 mg Oral QID  . melatonin  6 mg Oral QHS  . phenytoin  200 mg Oral BID  . QUEtiapine  200 mg Oral Daily  .  sacubitril-valsartan  1 tablet Oral BID   Continuous Infusions:  Principal Problem:   Hyponatremia Active Problems:   Chronic systolic CHF (congestive heart failure) (HCC)   Mixed hyperlipidemia   COPD (chronic obstructive pulmonary disease) (HCC)   Seizure disorder (HCC)   Paranoid schizophrenia (HCC)   Hypokalemia   Hypomagnesemia   Suicidal ideation   Noncompliance with medications   Subtherapeutic serum dilantin level   Seizure (HCC)        Consultants:  None  Procedures:  None  Antibiotics: Anti-infectives (From admission, onward)   None       Time spent: 10 minutes    Junious Silk ANP  [facility] Pager [phone]. If 7PM-7AM, please contact night-coverage at [email or web address] 03/24/2020, 12:27 PM  LOS: 30 days

## 2020-03-25 LAB — BASIC METABOLIC PANEL
Anion gap: 8 (ref 5–15)
BUN: 8 mg/dL (ref 6–20)
CO2: 25 mmol/L (ref 22–32)
Calcium: 9.3 mg/dL (ref 8.9–10.3)
Chloride: 99 mmol/L (ref 98–111)
Creatinine, Ser: 0.54 mg/dL — ABNORMAL LOW (ref 0.61–1.24)
GFR calc Af Amer: >60 mL/min (ref >60)
GFR calc non Af Amer: >60 mL/min (ref >60)
Glucose, Bld: 87 mg/dL (ref 70–99)
Potassium: 4 mmol/L (ref 3.5–5.1)
Sodium: 132 mmol/L — ABNORMAL LOW (ref 135–145)

## 2020-03-25 MED ORDER — ZOLPIDEM TARTRATE 5 MG PO TABS
5.0000 mg | ORAL_TABLET | Freq: Every evening | ORAL | Status: DC | PRN
  Administered 2020-03-25 – 2020-05-03 (×34): 5 mg via ORAL
  Filled 2020-03-25 (×34): qty 1

## 2020-03-25 NOTE — Progress Notes (Signed)
Pt refused lovenox.  Stated he "walks a lot and only takes it every other day."  Educated patient.

## 2020-03-25 NOTE — Progress Notes (Signed)
PROGRESS NOTE    Jim Dunn  EXB:[id number] DOB: 1966/07/14 DOA: 02/23/2020 PCP: System, Pcp Not In   Brief Narrative:  54 year old male with past medical history of schizophrenia, seizure disorder, biventricular heart failure(Echo 01/2020 EF 25-30%with right-sided heart failure),severe tricuspid regurgitation, nicotine dependence and COPD who presents to [facility] emergency department with suicidal ideation and seizure.Patient explains that since he left the hospital several days ago(admitted6/17-6/19-was being managed for hyponatremia, thought to be multifactorial in origin. Patient left AMA on6/19 and that time had sodium of 132),he has been experiencing increasing fatigue. This increasing fatigue is associated with bilateral lower extremity tingling. Symptomsweresimilar to what prompted his presentation on 6/17. Additionally, he reported feeling depressed withsuicidal ideation, withouta plan. Patient reportedthat he hadattempted suicide in the distant past using pills but was unsuccessful and never sought medical attention. While the patient was waiting in the waiting room, he experienced a witnessed seizure while attempting to walk to the bathroom. Patient was immediately brought back to the emergency department for further evaluation. Patient was found to have substantial recurrent hyponatremia of 117 with concurrent hypokalemia of 2.6 and hypomagnesium of 1.4. Patient was administered 1236mg  of intravenous Fosphenytoinand initiated on low rate normal saline infusion. Patient was administered 2 g of intravenous magnesium and a 30 mEq of potassium.Patient's electrolytes have improved, no further seizure episodes at this time. Echo showed EF of 25-30%, cardiology recommended outpatient follow-up but otherwise continue Coreg, torsemide and Entresto.   Assessment & Plan   Hyponatremia with seizures/ Intravascular dehydration -Continues to have mild  hyponatremia -Dehydration appears to have resolved-appears to be euvolemic -Home diuretics of torsemide metolazone been discontinued -Sodium currently 132 -Continue to monitor BMP intermittently   Chronic systolic congestive heart failure  -Echocardiogram showed an EF of 25%, associated right heart sided failure  -Patient currently appears to be euvolemic and compensated -Patient has been on low-dose Lasix -Home diuretics of torsemide and metolazone have been discontinued -continue Coreg, Entresto, digoxin -Monitor intake and output, daily weights  Depression/ Suicidal ideations -Supposedly resolved -Psychiatry has been consulted and appreciated, recommended BuSpar, Neurontin, Invega (last injection on 03/11/2020) -Patient will need to follow-up with outpatient psychiatry   Seizure disorder -Currently stable -Likely secondary to hyponatremia and nonadherence with medications prior to admission -Dilantin level subtherapeutic on 7/18 at 2.6.  Dilantin dosage increased to 200 mg twice daily Plan repeat level in 1 week  Microcytic anemia -Iron studies relatively normal, hemoglobin stable -monitor H/H intermittently   COPD -Stable, continue bronchodilators PRN  Hyperlipidemia -Continue statin  Hypokalemia/hypomagnesemia -Continue to monitor and replace as needed  Prolonged QTC -Appears to have resolved. Continue seroquel  Neuropathy -Continue gabapentin TID and QHS  Modest obesity -BMP 31.07   DVT Prophylaxis  Lovenox/ambulatory  Code Status: Full  Family Communication: None at bedside  Disposition Plan:  Status is: Inpatient  Remains inpatient appropriate because:Unsafe d/c plan   Dispo: The patient is from: Home              Anticipated d/c is to: ALF              Anticipated d/c date is: > 3 days              Patient currently is medically stable to d/c.  Pending placement. TOC working on this.  Trying to obtain guardianship.  Patient currently  unsafe for discharge given his underlying schizophrenia and history of recurrent noncompliance which is led to his current hospitalization.  Consultants Psychiatry   Procedures  none  Antibiotics   Anti-infectives (From admission, onward)   None      Subjective:   Jim Dunn seen and examined today.  Patient with no complaints today. Denies current chest pain, shortness of breath, abdominal pain, N/V/D/C.  Objective:   Vitals:   03/25/20 0500 03/25/20 0820 03/25/20 0831 03/25/20 1206  BP:  133/74  (!) 138/85  Pulse:  81  73  Resp:  16  16  Temp:  98.4 F (36.9 C)  97.9 F (36.6 C)  TempSrc:  Oral  Oral  SpO2:  99% 99% 100%  Weight: 92.7 kg     Height:        Intake/Output Summary (Last 24 hours) at 03/25/2020 1456 Last data filed at 03/25/2020 3903 Gross per 24 hour  Intake 720 ml  Output --  Net 720 ml   Filed Weights   03/22/20 0628 03/24/20 0914 03/25/20 0500  Weight: 91.4 kg 92.5 kg 92.7 kg    Exam  General: Well developed, well nourished, NAD, appears stated age  HEENT: NCAT, mucous membranes moist.   Extremities: warm dry without cyanosis clubbing or edema  Neuro: AAOx3, nonfocal  Psych: Appropriate mood and affect   Data Reviewed: I have personally reviewed following labs and imaging studies  CBC: No results for input(s): WBC, NEUTROABS, HGB, HCT, MCV, PLT in the last 168 hours. Basic Metabolic Panel: Recent Labs  Lab 03/20/20 0604 03/21/20 0511 03/24/20 0632 03/25/20 0509  NA 132* 132* 133* 132*  K 4.1 4.2 4.0 4.0  CL 100 97* 98 99  CO2 23 25 26 25   GLUCOSE 101* 99 88 87  BUN 9 9 10 8   CREATININE 0.51* 0.49* 0.53* 0.54*  CALCIUM 9.3 9.6 9.4 9.3  MG  --  1.9  --   --    GFR: Estimated Creatinine Clearance: 119.3 mL/min (A) (by C-G formula based on SCr of 0.54 mg/dL (L)). Liver Function Tests: Recent Labs  Lab 03/21/20 0511  AST 28  ALT 31  ALKPHOS 222*  BILITOT 0.2*  PROT 7.4  ALBUMIN 3.7   No results for input(s):  LIPASE, AMYLASE in the last 168 hours. No results for input(s): AMMONIA in the last 168 hours. Coagulation Profile: No results for input(s): INR, PROTIME in the last 168 hours. Cardiac Enzymes: No results for input(s): CKTOTAL, CKMB, CKMBINDEX, TROPONINI in the last 168 hours. BNP (last 3 results) No results for input(s): PROBNP in the last 8760 hours. HbA1C: No results for input(s): HGBA1C in the last 72 hours. CBG: No results for input(s): GLUCAP in the last 168 hours. Lipid Profile: No results for input(s): CHOL, HDL, LDLCALC, TRIG, CHOLHDL, LDLDIRECT in the last 72 hours. Thyroid Function Tests: No results for input(s): TSH, T4TOTAL, FREET4, T3FREE, THYROIDAB in the last 72 hours. Anemia Panel: No results for input(s): VITAMINB12, FOLATE, FERRITIN, TIBC, IRON, RETICCTPCT in the last 72 hours. Urine analysis:    Component Value Date/Time   COLORURINE YELLOW 02/23/2020 1910   APPEARANCEUR CLEAR 02/23/2020 1910   LABSPEC 1.006 02/23/2020 1910   PHURINE 7.0 02/23/2020 1910   GLUCOSEU NEGATIVE 02/23/2020 1910   HGBUR NEGATIVE 02/23/2020 1910   BILIRUBINUR NEGATIVE 02/23/2020 1910   KETONESUR NEGATIVE 02/23/2020 1910   PROTEINUR NEGATIVE 02/23/2020 1910   NITRITE NEGATIVE 02/23/2020 1910   LEUKOCYTESUR NEGATIVE 02/23/2020 1910   Sepsis Labs: @LABRCNTIP (procalcitonin:4,lacticidven:4)  )No results found for this or any previous visit (from the past 240 hour(s)).    Radiology Studies: No results found.   Scheduled  Meds: . atorvastatin  40 mg Oral Daily  . busPIRone  10 mg Oral TID  . carvedilol  3.125 mg Oral BID WC  . digoxin  0.125 mg Oral Daily  . enoxaparin (LOVENOX) injection  40 mg Subcutaneous Daily  . fluticasone furoate-vilanterol  1 puff Inhalation Daily  . furosemide  40 mg Oral Daily  . gabapentin  300 mg Oral QID  . phenytoin  200 mg Oral BID  . QUEtiapine  200 mg Oral Daily  . sacubitril-valsartan  1 tablet Oral BID   Continuous Infusions:   LOS: 31  days   Time Spent in minutes   30 minutes  Stephinie Battisti D.O. on 03/25/2020 at 2:56 PM  Between 7am to 7pm - Please see pager noted on [email or web address]  After 7pm go to [email or web address]  And look for the night coverage person covering for me after hours  [facility] Office  [phone]

## 2020-03-25 NOTE — Progress Notes (Signed)
Placed patient on CPAP for the night via auto-mode.  

## 2020-03-26 NOTE — Progress Notes (Addendum)
[facility] PROGRESS NOTE  Jim Dunn UEA:[id number] DOB: [birth year] DOA: 02/23/2020 PCP: System, Pcp Not In  Status: Inpatient---Remains inpatient appropriate because:Altered mental status and Unsafe d/c plan   Dispo: The patient is from: Home              Anticipated d/c is to: ALF              Anticipated d/c date is: > 3 days              Patient currently is medically stable to d/c.  Currently awaiting placement, TOC team working on this.  Mother is currently working to obtain Medicaid/guardianship.  As of 7/21 she has submitted all required paperwork to [facility] for [facility] in another county but needs clarification regarding which [place] he should actually apply for. Unsafe for discharge based on underlying schizophrenia and history of recurrent noncompliance was which led to patient's current hospitalization  Code Status: Full Family Communication: 7/21 updated [name] by telephone [phone] DVT prophylaxis: Lovenox Vaccination status: Vaccinated   HPI: 53 year old male with past medical history of schizophrenia, seizure disorder, biventricular heart failure(Echo 01/2020 EF 25-30%with right-sided heart failure),severe tricuspid regurgitation, nicotine dependence and COPD who presents to [facility] emergency department with suicidal ideation and seizure.Patient explains that since he left the hospital several days ago(admitted6/17-6/19-was being managed for hyponatremia, thought to be multifactorial in origin. Patient left AMA on6/19 and that time had sodium of 132),he has been experiencing increasing fatigue. This increasing fatigue is associated with bilateral lower extremity tingling. Symptomsweresimilar to what prompted his presentation on 6/17. Additionally, he reported feeling depressed withsuicidal ideation, withouta plan. Patient reportedthat he hadattempted suicide in the distant past using pills but was unsuccessful and  never sought medical attention. While the patient was waiting in the waiting room, he experienced a witnessed seizure while attempting to walk to the bathroom. Patient was immediately brought back to the emergency department for further evaluation. Patient was found to have substantial recurrent hyponatremia of 117 with concurrent hypokalemia of 2.6 and hypomagnesium of 1.4. Patient was administered 1236mg  of intravenous Fosphenytoinand initiated on low rate normal saline infusion. Patient was administered 2 g of intravenous magnesium and a 30 mEq of potassium.  Patient's electrolytes have improved, no further seizure episodes at this time.  Echo showed EF of 25-30%, cardiology recommended outpatient follow-up but otherwise continue Coreg, torsemide and Entresto.    Subjective: Appreciated time off unit yesterday. Is interested in obtaining DVD player and movies if an option Chest pain or shortness of breath  Objective: Vitals:   03/26/20 0856 03/26/20 1145  BP: (!) 155/85 (!) 130/88  Pulse: 77 79  Resp: 20 17  Temp: 98.5 F (36.9 C) 98.3 F (36.8 C)  SpO2: 100% 99%    Intake/Output Summary (Last 24 hours) at 03/26/2020 1250 Last data filed at 03/26/2020 0905 Gross per 24 hour  Intake 700 ml  Output --  Net 700 ml   Filed Weights   03/22/20 0628 03/24/20 0914 03/25/20 0500  Weight: 91.4 kg 92.5 kg 92.7 kg    Exam:  Constitutional: NAD, calm, comfortable Respiratory: clear to auscultation bilaterally, Normal respiratory effort.  Room air Cardiovascular: Regular rate and rhythm, no murmurs / rubs / gallops. No extremity edema.  No JVD.  2+ pedal pulses.  Abdomen: no tenderness, no masses palpated.  Bowel sounds positive.  Tolerating diet without difficulty Neurologic: CN 2-12 grossly intact. Sensation intact, DTR normal. Strength 5/5 x all 4 extremities.  Ambulates independently Psychiatric: Alert and oriented x 3 although does not have appropriate insight or capacity to  manage care independently secondary to longstanding schizophrenia. Normal mood.    Assessment/Plan:  Hyponatremia with seizures: Intravascular dehydration, resolved Held home diuretic diuretics- torsemide, metolazone discontinued.   Na+ 133 on 7/21-repeat again on 7/22 was 132-this point can check prn Currently appears euvolemic  Chronic congestive heart failure with reduced ejection fraction, 25%.-Associated right heart failure Euvolemic for several days but developed trace edema for was initiated on low-dose Lasix-sodium decreased slightly but otherwise has remained stable.   Continue low-dose Lasix  Echo in May 2021-EF 25-30% Continue Coreg, Entresto.   Daily weights with strict I's/O On heart healthy diet without fluid restriction Digoxin level subtherapeutic at <0.02 therefore dose increased to 0.125 mg daily-recommend repeat level again on 7/25 Patient again instructed to minimize salt intake but is allowed to have one snack size bag of chips such as Fritos once per week, encouraged to sit recliner with feet elevated   Depression, Suicidal ideations: Resolved, seen by psychiatry.  Recommended to continue BuSpar, Neurontin, Invega.   Follow-up outpatient psychiatry.   Received his routine Invega shot 7/8, takes this monthly-next dose due 8/8  Seizure disorder: Stable Secondary to hyponatremia and issues of nonadherence with medications prior to admission.   Dilantin level subtherapeutic on 7/18 at 2.6 - Dilantin dosage increased to 200 mg twice daily Plan repeat level on 7/25  Microcytic anemia Iron studies relatively normal, hemoglobin stable  OSA Continue nocturnal CPAP  COPD: stable. As needed bronchodilators  Hyperlipidemia: Daily Lipitor 40 mg  Hypokalemia/hypomagnesemia Replace as needed  Prolonged QTC:  Appears to have resolved.   Can you Seroquel 200 mg daily.  Neuropathy Currently on gabapentin 300 mg 3 TID   On 7/14 added a bedtime dose  of 300 mg with improvement in discomfort  Modest obesity Estimated body mass index is 31.07 kg/m as calculated from the following:   Height as of this encounter: 5\' 8"  (1.727 m).   Weight as of this encounter: 92.7 kg.  **Okay to leave IV line out at patient's request, no acute issues ongoing at this time.    Data Reviewed: Basic Metabolic Panel: Recent Labs  Lab 03/20/20 0604 03/21/20 0511 03/24/20 0632 03/25/20 0509  NA 132* 132* 133* 132*  K 4.1 4.2 4.0 4.0  CL 100 97* 98 99  CO2 23 25 26 25   GLUCOSE 101* 99 88 87  BUN 9 9 10 8   CREATININE 0.51* 0.49* 0.53* 0.54*  CALCIUM 9.3 9.6 9.4 9.3  MG  --  1.9  --   --    Liver Function Tests: Recent Labs  Lab 03/21/20 0511  AST 28  ALT 31  ALKPHOS 222*  BILITOT 0.2*  PROT 7.4  ALBUMIN 3.7   No results for input(s): LIPASE, AMYLASE in the last 168 hours. No results for input(s): AMMONIA in the last 168 hours. CBC: No results for input(s): WBC, NEUTROABS, HGB, HCT, MCV, PLT in the last 168 hours. Cardiac Enzymes: No results for input(s): CKTOTAL, CKMB, CKMBINDEX, TROPONINI in the last 168 hours. BNP (last 3 results) Recent Labs    01/22/20 0410 02/24/20 0030  BNP 697.9* 184.7*    ProBNP (last 3 results) No results for input(s): PROBNP in the last 8760 hours.  CBG: No results for input(s): GLUCAP in the last 168 hours.  No results found for this or any previous visit (from the past 240 hour(s)).   Studies: No results  found.  Scheduled Meds: . atorvastatin  40 mg Oral Daily  . busPIRone  10 mg Oral TID  . carvedilol  3.125 mg Oral BID WC  . digoxin  0.125 mg Oral Daily  . enoxaparin (LOVENOX) injection  40 mg Subcutaneous Daily  . fluticasone furoate-vilanterol  1 puff Inhalation Daily  . furosemide  40 mg Oral Daily  . gabapentin  300 mg Oral QID  . phenytoin  200 mg Oral BID  . QUEtiapine  200 mg Oral Daily  . sacubitril-valsartan  1 tablet Oral BID   Continuous Infusions:  Principal  Problem:   Hyponatremia Active Problems:   Chronic systolic CHF (congestive heart failure) (HCC)   Mixed hyperlipidemia   COPD (chronic obstructive pulmonary disease) (HCC)   Seizure disorder (HCC)   Paranoid schizophrenia (HCC)   Hypokalemia   Hypomagnesemia   Suicidal ideation   Noncompliance with medications   Subtherapeutic serum dilantin level   Seizure (HCC)        Consultants:  None  Procedures:  None  Antibiotics: Anti-infectives (From admission, onward)   None       Time spent: 10 minutes    Junious Silk ANP  [facility] Pager [phone]. If 7PM-7AM, please contact night-coverage at [email or web address] 03/26/2020, 12:50 PM  LOS: 32 days

## 2020-03-27 NOTE — Progress Notes (Signed)
PROGRESS NOTE    Jim Dunn  MWU:[id number] DOB: [birth year] DOA: 02/23/2020 PCP: System, Pcp Not In   Brief Narrative:  54 year old male with past medical history of schizophrenia, seizure disorder, biventricular heart failure(Echo 01/2020 EF 25-30%with right-sided heart failure),severe tricuspid regurgitation, nicotine dependence and COPD who presents to [facility] emergency department with suicidal ideation and seizure.Patient explains that since he left the hospital several days ago(admitted6/17-6/19-was being managed for hyponatremia, thought to be multifactorial in origin. Patient left AMA on6/19 and that time had sodium of 132),he has been experiencing increasing fatigue. This increasing fatigue is associated with bilateral lower extremity tingling. Symptomsweresimilar to what prompted his presentation on 6/17. Additionally, he reported feeling depressed withsuicidal ideation, withouta plan. Patient reportedthat he hadattempted suicide in the distant past using pills but was unsuccessful and never sought medical attention. While the patient was waiting in the waiting room, he experienced a witnessed seizure while attempting to walk to the bathroom. Patient was immediately brought back to the emergency department for further evaluation. Patient was found to have substantial recurrent hyponatremia of 117 with concurrent hypokalemia of 2.6 and hypomagnesium of 1.4. Patient was administered 1236mg  of intravenous Fosphenytoinand initiated on low rate normal saline infusion. Patient was administered 2 g of intravenous magnesium and a 30 mEq of potassium.Patient's electrolytes have improved, no further seizure episodes at this time. Echo showed EF of 25-30%, cardiology recommended outpatient follow-up but otherwise continue Coreg, torsemide and Entresto.   Assessment & Plan   Hyponatremia with seizures/ Intravascular dehydration -Continues to have mild  hyponatremia -Dehydration appears to have resolved-appears to be euvolemic -Home diuretics of torsemide metolazone been discontinued -Sodium 132, last checked on 7/22 -Continue to monitor BMP intermittently   Chronic systolic congestive heart failure  -Echocardiogram showed an EF of 25%, associated right heart sided failure  -Patient currently appears to be euvolemic and compensated -Patient has been on low-dose Lasix -Home diuretics of torsemide and metolazone have been discontinued -continue Coreg, Entresto, digoxin -Monitor intake and output, daily weights  Depression/ Suicidal ideations -Supposedly resolved -Psychiatry has been consulted and appreciated, recommended BuSpar, Neurontin, Invega (last injection on 03/11/2020) -Patient will need to follow-up with outpatient psychiatry   Seizure disorder -Currently stable -Likely secondary to hyponatremia and nonadherence with medications prior to admission -Dilantin level subtherapeutic on 7/18 at 2.6.  Dilantin dosage increased to 200 mg twice daily Plan repeat level in 1 week  Microcytic anemia -Iron studies relatively normal, hemoglobin stable -monitor H/H intermittently   COPD -Stable, continue bronchodilators PRN  Hyperlipidemia -Continue statin  Hypokalemia/hypomagnesemia -Resolved with replacement -Continue to monitor and replace as needed  Prolonged QTC -Appears to have resolved. Continue seroquel  Neuropathy -Continue gabapentin TID and QHS  Modest obesity -BMP 31.07   DVT Prophylaxis  Lovenox/ambulatory  Code Status: Full  Family Communication: None at bedside  Disposition Plan:  Status is: Inpatient  Remains inpatient appropriate because:Unsafe d/c plan   Dispo: The patient is from: Home              Anticipated d/c is to: ALF              Anticipated d/c date is: > 3 days              Patient currently is medically stable to d/c.  Pending placement. TOC working on this.  Trying to  obtain guardianship.  Patient currently unsafe for discharge given his underlying schizophrenia and history of recurrent noncompliance which is led to his current hospitalization.  Consultants Psychiatry   Procedures  none  Antibiotics   Anti-infectives (From admission, onward)   None      Subjective:   Jim Dunn seen and examined today.  Patient with no complaints today.  Denies current chest pain or shortness of breath, abdominal pain, nausea vomiting, diarrhea constipation.  Feels very tired today.   Objective:   Vitals:   03/27/20 0315 03/27/20 0444 03/27/20 0812 03/27/20 0856  BP: (!) 143/94   127/71  Pulse: 81   85  Resp: 18   20  Temp: 97.6 F (36.4 C)   97.9 F (36.6 C)  TempSrc: Oral   Oral  SpO2: 97%  97% 99%  Weight:  (!) 93.2 kg    Height:        Intake/Output Summary (Last 24 hours) at 03/27/2020 1223 Last data filed at 03/27/2020 0400 Gross per 24 hour  Intake 1182 ml  Output --  Net 1182 ml   Filed Weights   03/24/20 0914 03/25/20 0500 03/27/20 0444  Weight: 92.5 kg 92.7 kg (!) 93.2 kg   Exam  General: Well developed, well nourished, NAD, appears stated age  HEENT: NCAT, mucous membranes moist.   Cardiovascular: S1 S2 auscultated, RRR  Respiratory: Clear to auscultation bilaterally  Abdomen: Soft, nontender, nondistended, + bowel sounds  Extremities: warm dry without cyanosis clubbing or edema  Neuro: AAOx3, nonfocal  Psych: appropriate mood and affect  Data Reviewed: I have personally reviewed following labs and imaging studies  CBC: No results for input(s): WBC, NEUTROABS, HGB, HCT, MCV, PLT in the last 168 hours. Basic Metabolic Panel: Recent Labs  Lab 03/21/20 0511 03/24/20 0632 03/25/20 0509  NA 132* 133* 132*  K 4.2 4.0 4.0  CL 97* 98 99  CO2 25 26 25   GLUCOSE 99 88 87  BUN 9 10 8   CREATININE 0.49* 0.53* 0.54*  CALCIUM 9.6 9.4 9.3  MG 1.9  --   --    GFR: Estimated Creatinine Clearance: 119.6 mL/min (A) (by C-G  formula based on SCr of 0.54 mg/dL (L)). Liver Function Tests: Recent Labs  Lab 03/21/20 0511  AST 28  ALT 31  ALKPHOS 222*  BILITOT 0.2*  PROT 7.4  ALBUMIN 3.7   No results for input(s): LIPASE, AMYLASE in the last 168 hours. No results for input(s): AMMONIA in the last 168 hours. Coagulation Profile: No results for input(s): INR, PROTIME in the last 168 hours. Cardiac Enzymes: No results for input(s): CKTOTAL, CKMB, CKMBINDEX, TROPONINI in the last 168 hours. BNP (last 3 results) No results for input(s): PROBNP in the last 8760 hours. HbA1C: No results for input(s): HGBA1C in the last 72 hours. CBG: No results for input(s): GLUCAP in the last 168 hours. Lipid Profile: No results for input(s): CHOL, HDL, LDLCALC, TRIG, CHOLHDL, LDLDIRECT in the last 72 hours. Thyroid Function Tests: No results for input(s): TSH, T4TOTAL, FREET4, T3FREE, THYROIDAB in the last 72 hours. Anemia Panel: No results for input(s): VITAMINB12, FOLATE, FERRITIN, TIBC, IRON, RETICCTPCT in the last 72 hours. Urine analysis:    Component Value Date/Time   COLORURINE YELLOW 02/23/2020 1910   APPEARANCEUR CLEAR 02/23/2020 1910   LABSPEC 1.006 02/23/2020 1910   PHURINE 7.0 02/23/2020 1910   GLUCOSEU NEGATIVE 02/23/2020 1910   HGBUR NEGATIVE 02/23/2020 1910   BILIRUBINUR NEGATIVE 02/23/2020 1910   KETONESUR NEGATIVE 02/23/2020 1910   PROTEINUR NEGATIVE 02/23/2020 1910   NITRITE NEGATIVE 02/23/2020 1910   LEUKOCYTESUR NEGATIVE 02/23/2020 1910   Sepsis Labs: @LABRCNTIP (procalcitonin:4,lacticidven:4)  )No  results found for this or any previous visit (from the past 240 hour(s)).    Radiology Studies: No results found.   Scheduled Meds: . atorvastatin  40 mg Oral Daily  . busPIRone  10 mg Oral TID  . carvedilol  3.125 mg Oral BID WC  . digoxin  0.125 mg Oral Daily  . enoxaparin (LOVENOX) injection  40 mg Subcutaneous Daily  . fluticasone furoate-vilanterol  1 puff Inhalation Daily  .  furosemide  40 mg Oral Daily  . gabapentin  300 mg Oral QID  . phenytoin  200 mg Oral BID  . QUEtiapine  200 mg Oral Daily  . sacubitril-valsartan  1 tablet Oral BID   Continuous Infusions:   LOS: 33 days   Time Spent in minutes   30 minutes  Dallon Dacosta D.O. on 03/27/2020 at 12:23 PM  Between 7am to 7pm - Please see pager noted on [email or web address]  After 7pm go to [email or web address]  And look for the night coverage person covering for me after hours  [facility] Office  [phone]

## 2020-03-28 DIAGNOSIS — F200 Paranoid schizophrenia: Secondary | ICD-10-CM | POA: Diagnosis not present

## 2020-03-28 DIAGNOSIS — K0401 Reversible pulpitis: Secondary | ICD-10-CM | POA: Diagnosis not present

## 2020-03-28 DIAGNOSIS — R45851 Suicidal ideations: Secondary | ICD-10-CM | POA: Diagnosis not present

## 2020-03-28 DIAGNOSIS — E222 Syndrome of inappropriate secretion of antidiuretic hormone: Secondary | ICD-10-CM | POA: Diagnosis not present

## 2020-03-28 LAB — DIGOXIN LEVEL: Digoxin Level: 0.3 ng/mL — ABNORMAL LOW (ref 0.8–2.0)

## 2020-03-28 LAB — BASIC METABOLIC PANEL
Anion gap: 8 (ref 5–15)
BUN: 15 mg/dL (ref 6–20)
CO2: 26 mmol/L (ref 22–32)
Calcium: 9.1 mg/dL (ref 8.9–10.3)
Chloride: 97 mmol/L — ABNORMAL LOW (ref 98–111)
Creatinine, Ser: 0.79 mg/dL (ref 0.61–1.24)
GFR calc Af Amer: >60 mL/min (ref >60)
GFR calc non Af Amer: >60 mL/min (ref >60)
Glucose, Bld: 120 mg/dL — ABNORMAL HIGH (ref 70–99)
Potassium: 4.1 mmol/L (ref 3.5–5.1)
Sodium: 131 mmol/L — ABNORMAL LOW (ref 135–145)

## 2020-03-28 LAB — HEMOGLOBIN AND HEMATOCRIT, BLOOD
HCT: 33.3 % — ABNORMAL LOW (ref 39.0–52.0)
Hemoglobin: 10.5 g/dL — ABNORMAL LOW (ref 13.0–17.0)

## 2020-03-28 LAB — PHENYTOIN LEVEL, TOTAL: Phenytoin Lvl: 4.2 ug/mL — ABNORMAL LOW (ref 10.0–20.0)

## 2020-03-28 MED ORDER — PHENYTOIN SODIUM EXTENDED 100 MG PO CAPS
200.0000 mg | ORAL_CAPSULE | Freq: Every day | ORAL | Status: DC
  Administered 2020-03-29 – 2020-04-02 (×5): 200 mg via ORAL
  Filled 2020-03-28 (×5): qty 2

## 2020-03-28 MED ORDER — PHENYTOIN SODIUM EXTENDED 100 MG PO CAPS
300.0000 mg | ORAL_CAPSULE | Freq: Every day | ORAL | Status: DC
  Administered 2020-03-28 – 2020-04-01 (×5): 300 mg via ORAL
  Filled 2020-03-28 (×5): qty 3

## 2020-03-28 NOTE — Progress Notes (Signed)
PROGRESS NOTE    Jim Dunn  EQA:[id number] DOB: [birth year] DOA: 02/23/2020 PCP: System, Pcp Not In   Brief Narrative:  54 year old male with past medical history of schizophrenia, seizure disorder, biventricular heart failure(Echo 01/2020 EF 25-30%with right-sided heart failure),severe tricuspid regurgitation, nicotine dependence and COPD who presents to [facility] emergency department with suicidal ideation and seizure.Patient explains that since he left the hospital several days ago(admitted6/17-6/19-was being managed for hyponatremia, thought to be multifactorial in origin. Patient left AMA on6/19 and that time had sodium of 132),he has been experiencing increasing fatigue. This increasing fatigue is associated with bilateral lower extremity tingling. Symptomsweresimilar to what prompted his presentation on 6/17. Additionally, he reported feeling depressed withsuicidal ideation, withouta plan. Patient reportedthat he hadattempted suicide in the distant past using pills but was unsuccessful and never sought medical attention. While the patient was waiting in the waiting room, he experienced a witnessed seizure while attempting to walk to the bathroom. Patient was immediately brought back to the emergency department for further evaluation. Patient was found to have substantial recurrent hyponatremia of 117 with concurrent hypokalemia of 2.6 and hypomagnesium of 1.4. Patient was administered 1236mg  of intravenous Fosphenytoinand initiated on low rate normal saline infusion. Patient was administered 2 g of intravenous magnesium and a 30 mEq of potassium.Patient's electrolytes have improved, no further seizure episodes at this time. Echo showed EF of 25-30%, cardiology recommended outpatient follow-up but otherwise continue Coreg, torsemide and Entresto.   Assessment & Plan   Hyponatremia with seizures/ Intravascular dehydration -Continues to have mild  hyponatremia -Dehydration appears to have resolved-appears to be euvolemic -Home diuretics of torsemide metolazone been discontinued -Sodium 131 today -Continue to monitor BMP intermittently   Chronic systolic congestive heart failure  -Echocardiogram showed an EF of 25%, associated right heart sided failure  -Patient currently appears to be euvolemic and compensated -Patient has been on low-dose Lasix -Home diuretics of torsemide and metolazone have been discontinued -continue Coreg, Entresto, digoxin (level subtherapeutic- pharmacy to adjust, however no arrhythmia) -Monitor intake and output, daily weights  Depression/ Suicidal ideations -Supposedly resolved -Psychiatry has been consulted and appreciated, recommended BuSpar, Neurontin, Invega (last injection on 03/11/2020) -Patient will need to follow-up with outpatient psychiatry   Seizure disorder -Currently stable -Likely secondary to hyponatremia and nonadherence with medications prior to admission -Dilantin level subtherapeutic on 7/25- 2.6 -Discussed with pharmacy, will change dose of phenytoin   Microcytic anemia -Iron studies relatively normal, hemoglobin stable -monitor H/H intermittently   COPD -Stable, continue bronchodilators PRN  Hyperlipidemia -Continue statin  Hypokalemia/hypomagnesemia -Resolved with replacement -Continue to monitor and replace as needed  Prolonged QTC -Appears to have resolved. Continue seroquel  Neuropathy -Continue gabapentin TID and QHS  Modest obesity -BMP 31.07   DVT Prophylaxis  Lovenox/ambulatory  Code Status: Full  Family Communication: None at bedside  Disposition Plan:  Status is: Inpatient  Remains inpatient appropriate because:Unsafe d/c plan   Dispo: The patient is from: Home              Anticipated d/c is to: ALF              Anticipated d/c date is: > 3 days              Patient currently is medically stable to d/c.  Pending placement. TOC  working on this.  Trying to obtain guardianship.  Patient currently unsafe for discharge given his underlying schizophrenia and history of recurrent noncompliance which is led to his current hospitalization.  Consultants  Psychiatry   Procedures  none  Antibiotics   Anti-infectives (From admission, onward)   None      Subjective:   Jim Dunn seen and examined today.  No complaints today. Denies current chest pain, shortness of breath, abdominal pain, N/V/D/C.  States he stays up late at night.  Objective:   Vitals:   03/28/20 0028 03/28/20 0432 03/28/20 0752 03/28/20 0842  BP: (!) 98/60 112/78  (!) 134/67  Pulse:  95  91  Resp:  18  19  Temp:  99.1 F (37.3 C)  98.6 F (37 C)  TempSrc:  Oral  Oral  SpO2:  100% 97% 100%  Weight:      Height:        Intake/Output Summary (Last 24 hours) at 03/28/2020 1250 Last data filed at 03/28/2020 0400 Gross per 24 hour  Intake 776 ml  Output --  Net 776 ml   Filed Weights   03/24/20 0914 03/25/20 0500 03/27/20 0444  Weight: 92.5 kg 92.7 kg (!) 93.2 kg   Exam  General: Well developed, well nourished, NAD, appears stated age  HEENT: NCAT, mucous membranes moist.   Cardiovascular: S1 S2 auscultated, RRR, no murmur  Respiratory: Clear to auscultation bilaterally  Abdomen: Soft, nontender, nondistended, + bowel sounds  Extremities: warm dry without cyanosis clubbing or edema  Neuro: AAOx3, nonfocal  Psych: Appropriate mood and affect, pleasant    Data Reviewed: I have personally reviewed following labs and imaging studies  CBC: Recent Labs  Lab 03/28/20 0631  HGB 10.5*  HCT 33.3*   Basic Metabolic Panel: Recent Labs  Lab 03/24/20 0632 03/25/20 0509 03/28/20 0631  NA 133* 132* 131*  K 4.0 4.0 4.1  CL 98 99 97*  CO2 26 25 26   GLUCOSE 88 87 120*  BUN 10 8 15   CREATININE 0.53* 0.54* 0.79  CALCIUM 9.4 9.3 9.1   GFR: Estimated Creatinine Clearance: 119.6 mL/min (by C-G formula based on SCr of 0.79  mg/dL). Liver Function Tests: No results for input(s): AST, ALT, ALKPHOS, BILITOT, PROT, ALBUMIN in the last 168 hours. No results for input(s): LIPASE, AMYLASE in the last 168 hours. No results for input(s): AMMONIA in the last 168 hours. Coagulation Profile: No results for input(s): INR, PROTIME in the last 168 hours. Cardiac Enzymes: No results for input(s): CKTOTAL, CKMB, CKMBINDEX, TROPONINI in the last 168 hours. BNP (last 3 results) No results for input(s): PROBNP in the last 8760 hours. HbA1C: No results for input(s): HGBA1C in the last 72 hours. CBG: No results for input(s): GLUCAP in the last 168 hours. Lipid Profile: No results for input(s): CHOL, HDL, LDLCALC, TRIG, CHOLHDL, LDLDIRECT in the last 72 hours. Thyroid Function Tests: No results for input(s): TSH, T4TOTAL, FREET4, T3FREE, THYROIDAB in the last 72 hours. Anemia Panel: No results for input(s): VITAMINB12, FOLATE, FERRITIN, TIBC, IRON, RETICCTPCT in the last 72 hours. Urine analysis:    Component Value Date/Time   COLORURINE YELLOW 02/23/2020 1910   APPEARANCEUR CLEAR 02/23/2020 1910   LABSPEC 1.006 02/23/2020 1910   PHURINE 7.0 02/23/2020 1910   GLUCOSEU NEGATIVE 02/23/2020 1910   HGBUR NEGATIVE 02/23/2020 1910   BILIRUBINUR NEGATIVE 02/23/2020 1910   KETONESUR NEGATIVE 02/23/2020 1910   PROTEINUR NEGATIVE 02/23/2020 1910   NITRITE NEGATIVE 02/23/2020 1910   LEUKOCYTESUR NEGATIVE 02/23/2020 1910   Sepsis Labs: @LABRCNTIP (procalcitonin:4,lacticidven:4)  )No results found for this or any previous visit (from the past 240 hour(s)).    Radiology Studies: No results found.   Scheduled Meds: .  atorvastatin  40 mg Oral Daily  . busPIRone  10 mg Oral TID  . carvedilol  3.125 mg Oral BID WC  . digoxin  0.125 mg Oral Daily  . enoxaparin (LOVENOX) injection  40 mg Subcutaneous Daily  . fluticasone furoate-vilanterol  1 puff Inhalation Daily  . furosemide  40 mg Oral Daily  . gabapentin  300 mg Oral QID   . phenytoin  200 mg Oral BID  . QUEtiapine  200 mg Oral Daily  . sacubitril-valsartan  1 tablet Oral BID   Continuous Infusions:   LOS: 34 days   Time Spent in minutes   30 minutes  Jasani Lengel D.O. on 03/28/2020 at 12:50 PM  Between 7am to 7pm - Please see pager noted on [email or web address]  After 7pm go to [email or web address]  And look for the night coverage person covering for me after hours  [facility] Office  [phone]

## 2020-03-28 NOTE — Progress Notes (Signed)
   03/28/20 2311  Provider Notification  Provider Name/Title Dr Rachael Darby  Date Provider Notified 03/28/20  Time Provider Notified 2310  Notification Type Page  Notification Reason Other (Comment) (Pt's HR went up to 120s, EKG done after, read SR, HR 80s)  Response No new orders  Date of Provider Response 03/28/20  Time of Provider Response 2315

## 2020-03-28 NOTE — Progress Notes (Signed)
Phenytoin Consult Indication: PMH of seizure disorder (witnessed seizure in ED 6/21).   No Known Allergies  Patient Measurements: Height: 5\' 8"  (172.7 cm) Weight: (!) 93.2 kg (205 lb 6.4 oz) IBW/kg (Calculated) : 68.4 TPN AdjBW (KG): 74.2 Body mass index is 31.23 kg/m.   Vital signs: Temp: 100.2 F (37.9 C) (07/25 1332) Temp Source: Oral (07/25 1332) BP: 124/75 (07/25 1332) Pulse Rate: 99 (07/25 1332)  Labs: Lab Results  Component Value Date/Time   Phenytoin Lvl 4.2 (L) 03/28/2020 0631   Lab Results  Component Value Date   PHENYTOIN 4.2 (L) 03/28/2020   Estimated Creatinine Clearance: 119.6 mL/min (by C-G formula based on SCr of 0.79 mg/dL).   Medications:  Scheduled:  . atorvastatin  40 mg Oral Daily  . busPIRone  10 mg Oral TID  . carvedilol  3.125 mg Oral BID WC  . digoxin  0.125 mg Oral Daily  . enoxaparin (LOVENOX) injection  40 mg Subcutaneous Daily  . fluticasone furoate-vilanterol  1 puff Inhalation Daily  . furosemide  40 mg Oral Daily  . gabapentin  300 mg Oral QID  . phenytoin  200 mg Oral BID  . QUEtiapine  200 mg Oral Daily  . sacubitril-valsartan  1 tablet Oral BID    Assessment: Corrected phenytoin level (if needed): 5 Seizure activity:no seizure since witnessed seizure in ED waiting room on 6/21 Significant potential drug interactions: digoxin (dec dig level), quetiapine (dec quetiapine level), atorvastatin (dec atorvastatin level)  Goals of care:  Total phenytoin level: 10-20 mcg/ml Free phenytoin level: 1-2 mcg/ml  Plan:  Increase phenytoin dose due to subtherapeutic corrected level of 5. Per pharmacokinetic equations, the goal phenytoin daily dose for this pt will be 700-750mg . However, this will put the patient above the recommended 6mg /kg/day. Will recheck level in 5 days.   Phenytoin 300mg  capsule by mouth every morning and phenytoin 200mg  capsule by mouth every evening.    7/21  PGY1 pharmacy resident 03/28/2020,1:47  PM

## 2020-03-29 ENCOUNTER — Inpatient Hospital Stay (HOSPITAL_COMMUNITY): Payer: Medicare PPO

## 2020-03-29 MED ORDER — SODIUM CHLORIDE 0.9 % IV SOLN
3.0000 g | Freq: Four times a day (QID) | INTRAVENOUS | Status: DC
  Administered 2020-03-29 – 2020-04-02 (×17): 3 g via INTRAVENOUS
  Filled 2020-03-29 (×5): qty 3
  Filled 2020-03-29: qty 8
  Filled 2020-03-29 (×2): qty 3
  Filled 2020-03-29: qty 8
  Filled 2020-03-29: qty 3
  Filled 2020-03-29 (×2): qty 8
  Filled 2020-03-29: qty 3
  Filled 2020-03-29 (×2): qty 8
  Filled 2020-03-29: qty 3
  Filled 2020-03-29 (×2): qty 8
  Filled 2020-03-29 (×2): qty 3

## 2020-03-29 MED ORDER — DIPHENHYDRAMINE HCL 25 MG PO CAPS
50.0000 mg | ORAL_CAPSULE | Freq: Once | ORAL | Status: AC
  Administered 2020-03-29: 50 mg via ORAL
  Filled 2020-03-29: qty 2

## 2020-03-29 NOTE — Progress Notes (Signed)
Pt's upper Lip observed to be swollen, with one of the upper tooth observed to be discolored and swollen gum, pt also c/o of pain around the area, tab tylenol 650mg  given, pt reassured, will however continue to monitor. Jim Dunn, Jim Dunn

## 2020-03-29 NOTE — Progress Notes (Signed)
[facility] PROGRESS NOTE  Jim Dunn PYK:[id number] DOB: [birth year] DOA: 02/23/2020 PCP: System, Pcp Not In  Status: Inpatient---Remains inpatient appropriate because:Altered mental status and Unsafe d/c plan   Dispo: The patient is from: Home              Anticipated d/c is to: ALF              Anticipated d/c date is: > 3 days              Patient currently is medically stable to d/c.  Currently awaiting placement, TOC team working on this.  Mother is currently working to obtain Medicaid/guardianship.  As of 7/21 she has submitted all required paperwork to [facility] for [facility] in another county but needs clarification regarding which [place] he should actually apply for. Unsafe for discharge based on underlying schizophrenia and history of recurrent noncompliance was which led to patient's current hospitalization  Code Status: Full Family Communication: 7/21 updated [name] by telephone [phone] DVT prophylaxis: Lovenox Vaccination status: Vaccinated   HPI: 54 year old male with past medical history of schizophrenia, seizure disorder, biventricular heart failure(Echo 01/2020 EF 25-30%with right-sided heart failure),severe tricuspid regurgitation, nicotine dependence and COPD who presents to [facility] emergency department with suicidal ideation and seizure.Patient explains that since he left the hospital several days ago(admitted6/17-6/19-was being managed for hyponatremia, thought to be multifactorial in origin. Patient left AMA on6/19 and that time had sodium of 132),he has been experiencing increasing fatigue. This increasing fatigue is associated with bilateral lower extremity tingling. Symptomsweresimilar to what prompted his presentation on 6/17. Additionally, he reported feeling depressed withsuicidal ideation, withouta plan. Patient reportedthat he hadattempted suicide in the distant past using pills but was unsuccessful and  never sought medical attention. While the patient was waiting in the waiting room, he experienced a witnessed seizure while attempting to walk to the bathroom. Patient was immediately brought back to the emergency department for further evaluation. Patient was found to have substantial recurrent hyponatremia of 117 with concurrent hypokalemia of 2.6 and hypomagnesium of 1.4. Patient was administered 1236mg  of intravenous Fosphenytoinand initiated on low rate normal saline infusion. Patient was administered 2 g of intravenous magnesium and a 30 mEq of potassium.  Patient's electrolytes have improved, no further seizure episodes at this time.  Echo showed EF of 25-30%, cardiology recommended outpatient follow-up but otherwise continue Coreg, torsemide and Entresto.    Subjective: Increased swelling and discomfort in upper lip ongoing for about 24 hours -worse this morning  Objective: Vitals:   03/29/20 0739 03/29/20 0801  BP:  (!) 137/82  Pulse:  81  Resp:  16  Temp:  98.9 F (37.2 C)  SpO2: 98% 97%    Intake/Output Summary (Last 24 hours) at 03/29/2020 1221 Last data filed at 03/28/2020 1356 Gross per 24 hour  Intake 240 ml  Output --  Net 240 ml   Filed Weights   03/24/20 0914 03/25/20 0500 03/27/20 0444  Weight: 92.5 kg 92.7 kg (!) 93.2 kg    Exam:  Constitutional: NAD, calm, comfortable HEENT: Entire upper lip swollen and tender to touch along with frontal sinuses, patient has extremely poor dentition throughout mouth with multiple broken teeth in front aspect of upper gums.  There is also redness as well as a discolored area.  No drainage detected Respiratory: clear to auscultation bilaterally, Normal respiratory effort.  Room air Cardiovascular: Regular rate and rhythm, no murmurs / rubs / gallops. No extremity edema.  No JVD.  2+ pedal pulses.  Abdomen: no tenderness, no masses palpated.  Bowel sounds positive.  Tolerating diet without difficulty Neurologic: CN 2-12  grossly intact. Sensation intact, DTR normal. Strength 5/5 x all 4 extremities. Ambulates independently Psychiatric: Alert and oriented x 3 although does not have appropriate insight or capacity to manage care independently secondary to longstanding schizophrenia. Normal mood.    Assessment/Plan:  Hyponatremia with seizures: Intravascular dehydration, resolved Held home diuretic diuretics- torsemide, metolazone discontinued.   Na+ 133 on 7/21-repeat again on 7/22 was 132-this point can check prn Currently appears euvolemic  Lip edema/dental caries with abscess Patient developed low-grade fever and evolving upper lip swelling along with pain to upper gum, lips and frontal sinuses Orthopantogram reveals a lucency around the roots of right side posterior molar suggestive of possible infection Begin IV Unasyn No signs of sepsis Tylenol for pain-if needed can give short course of Oxy IR if Tylenol does not relieve pain-given underlying mild renal insufficiency and severe systolic heart failure would avoid NSAIDs  Chronic congestive heart failure with reduced ejection fraction, 25%.-Associated right heart failure Euvolemic for several days but developed trace edema for was initiated on low-dose Lasix-sodium decreased slightly but otherwise has remained stable.   Continue low-dose Lasix  Echo in May 2021-EF 25-30% Continue Coreg, Entresto.   Daily weights with strict I's/O On heart healthy diet without fluid restriction Digoxin level subtherapeutic at <0.02 therefore dose increased to 0.125 mg daily-repeat level 7/25 also subtherapeutic but slightly higher than previous.  Given utilization for heart failure only patient otherwise stable from a cardiac standpoint no indication to continue to check levels Patient again instructed to minimize salt intake but is allowed to have one snack size bag of chips such as Fritos once per week, encouraged to sit recliner with feet elevated   Depression,  Suicidal ideations: Resolved, seen by psychiatry.  Recommended to continue BuSpar, Neurontin, Invega.   Follow-up outpatient psychiatry.   Received his routine Invega shot 7/8, takes this monthly-next dose due 8/8  Seizure disorder: Stable Secondary to hyponatremia and issues of nonadherence with medications prior to admission.   Dilantin level subtherapeutic on 7/18 at 2.6 - Dilantin dosage increased to 200 mg twice daily-repeat level on 7/25 also subtherapeutic to therefore dosage increased at recommendation of pharmacy to 200 in the morning and 300 at bedtime  Microcytic anemia Iron studies relatively normal, hemoglobin stable  OSA Continue nocturnal CPAP  COPD: stable. As needed bronchodilators  Hyperlipidemia: Daily Lipitor 40 mg  Hypokalemia/hypomagnesemia Replace as needed  Prolonged QTC:  Appears to have resolved.   Can you Seroquel 200 mg daily.  Neuropathy Currently on gabapentin 300 mg 3 TID   On 7/14 added a bedtime dose of 300 mg with improvement in discomfort  Modest obesity Estimated body mass index is 31.23 kg/m as calculated from the following:   Height as of this encounter: 5\' 8"  (1.727 m).   Weight as of this encounter: 93.2 kg.      Data Reviewed: Basic Metabolic Panel: Recent Labs  Lab 03/24/20 0632 03/25/20 0509 03/28/20 0631  NA 133* 132* 131*  K 4.0 4.0 4.1  CL 98 99 97*  CO2 26 25 26   GLUCOSE 88 87 120*  BUN 10 8 15   CREATININE 0.53* 0.54* 0.79  CALCIUM 9.4 9.3 9.1   Liver Function Tests: No results for input(s): AST, ALT, ALKPHOS, BILITOT, PROT, ALBUMIN in the last 168 hours. No results for input(s): LIPASE, AMYLASE in the last 168 hours. No results  for input(s): AMMONIA in the last 168 hours. CBC: Recent Labs  Lab 03/28/20 0631  HGB 10.5*  HCT 33.3*   Cardiac Enzymes: No results for input(s): CKTOTAL, CKMB, CKMBINDEX, TROPONINI in the last 168 hours. BNP (last 3 results) Recent Labs    01/22/20 0410  02/24/20 0030  BNP 697.9* 184.7*    ProBNP (last 3 results) No results for input(s): PROBNP in the last 8760 hours.  CBG: No results for input(s): GLUCAP in the last 168 hours.  No results found for this or any previous visit (from the past 240 hour(s)).   Studies: DG Orthopantogram  Result Date: 03/29/2020 CLINICAL DATA:  Dental caries. EXAM: ORTHOPANTOGRAM/PANORAMIC COMPARISON:  None. FINDINGS: No fracture or dislocation is noted. Extremely poor dentition is noted. There is noted lucency around the roots of right-sided posterior molar in the mandible suggesting possible infection. IMPRESSION: Lucency is noted around the roots of right-sided posterior molar in the mandible suggesting possible infection. No other abnormality is noted in the visualized portions of the mandible. Electronically Signed   By: Lupita Raider M.D.   On: 03/29/2020 09:56    Scheduled Meds: . atorvastatin  40 mg Oral Daily  . busPIRone  10 mg Oral TID  . carvedilol  3.125 mg Oral BID WC  . digoxin  0.125 mg Oral Daily  . enoxaparin (LOVENOX) injection  40 mg Subcutaneous Daily  . fluticasone furoate-vilanterol  1 puff Inhalation Daily  . furosemide  40 mg Oral Daily  . gabapentin  300 mg Oral QID  . phenytoin  200 mg Oral Daily  . phenytoin  300 mg Oral QHS  . QUEtiapine  200 mg Oral Daily  . sacubitril-valsartan  1 tablet Oral BID   Continuous Infusions: . ampicillin-sulbactam (UNASYN) IV      Principal Problem:   Hyponatremia Active Problems:   Chronic systolic CHF (congestive heart failure) (HCC)   Mixed hyperlipidemia   COPD (chronic obstructive pulmonary disease) (HCC)   Seizure disorder (HCC)   Paranoid schizophrenia (HCC)   Hypokalemia   Hypomagnesemia   Suicidal ideation   Noncompliance with medications   Subtherapeutic serum dilantin level   Seizure (HCC)        Consultants:  None  Procedures:  None  Antibiotics: Anti-infectives (From admission, onward)   Start      Dose/Rate Route Frequency Ordered Stop   03/29/20 0930  Ampicillin-Sulbactam (UNASYN) 3 g in sodium chloride 0.9 % 100 mL IVPB     Discontinue     3 g 200 mL/hr over 30 Minutes Intravenous Every 6 hours 03/29/20 0915         Time spent: 10 minutes    Junious Silk ANP  [facility] Pager [phone]. If 7PM-7AM, please contact night-coverage at [email or web address] 03/29/2020, 12:21 PM  LOS: 35 days

## 2020-03-29 NOTE — Plan of Care (Signed)
Patient stable, discussed POC with patient, agreeable with plan, denies question/concerns at this time.   Problem: Clinical Measurements: Goal: Complications related to the disease process, condition or treatment will be avoided or minimized Outcome: Progressing

## 2020-03-29 NOTE — Progress Notes (Signed)
Pharmacy Antibiotic Note  Jim Dunn is a 54 y.o. male admitted on 02/23/2020 with a possible oral abscess.  Pharmacy has been consulted for unasyn dosing.  Plan: Unasyn 3gm IV q6h Monitor for clinical course, deescalation, and LOT  Height: 5\' 8"  (172.7 cm) Weight: (!) 93.2 kg (205 lb 6.4 oz) IBW/kg (Calculated) : 68.4  Temp (24hrs), Avg:99.3 F (37.4 C), Min:98.9 F (37.2 C), Max:100.2 F (37.9 C)  Recent Labs  Lab 03/24/20 0632 03/25/20 0509 03/28/20 0631  CREATININE 0.53* 0.54* 0.79    Estimated Creatinine Clearance: 119.6 mL/min (by C-G formula based on SCr of 0.79 mg/dL).    No Known Allergies  Antimicrobials this admission: 7/26 Unasyn >>    Dose adjustments this admission: n/a    Jim Dunn A. 8/26, PharmD, BCPS, FNKF Clinical Pharmacist [place] Please utilize Amion for appropriate phone number to reach the unit pharmacist [facility])   03/29/2020 9:13 AM

## 2020-03-30 NOTE — Progress Notes (Signed)
[facility] PROGRESS NOTE  Jim Dunn YOM:[id number] DOB: 07/29/1966 DOA: 02/23/2020 PCP: System, Pcp Not In  Status: Inpatient---Remains inpatient appropriate because:Altered mental status and Unsafe d/c plan   Dispo: The patient is from: Home              Anticipated d/c is to: ALF              Anticipated d/c date is: > 3 days              Patient currently is medically stable to d/c.  Currently awaiting placement, TOC team working on this.  Mother is currently working to obtain Medicaid/guardianship.  As of 7/21 she has submitted all required paperwork to [facility] for [facility] in another county but needs clarification regarding which [place] he should actually apply for. Unsafe for discharge based on underlying schizophrenia and history of recurrent noncompliance was which led to patient's current hospitalization  Code Status: Full Family Communication: 7/21 updated [name] by telephone [phone] DVT prophylaxis: Lovenox Vaccination status: Vaccinated   HPI: 54 year old male with past medical history of schizophrenia, seizure disorder, biventricular heart failure(Echo 01/2020 EF 25-30%with right-sided heart failure),severe tricuspid regurgitation, nicotine dependence and COPD who presents to [facility] emergency department with suicidal ideation and seizure.Patient explains that since he left the hospital several days ago(admitted6/17-6/19-was being managed for hyponatremia, thought to be multifactorial in origin. Patient left AMA on6/19 and that time had sodium of 132),he has been experiencing increasing fatigue. This increasing fatigue is associated with bilateral lower extremity tingling. Symptomsweresimilar to what prompted his presentation on 6/17. Additionally, he reported feeling depressed withsuicidal ideation, withouta plan. Patient reportedthat he hadattempted suicide in the distant past using pills but was unsuccessful and  never sought medical attention. While the patient was waiting in the waiting room, he experienced a witnessed seizure while attempting to walk to the bathroom. Patient was immediately brought back to the emergency department for further evaluation. Patient was found to have substantial recurrent hyponatremia of 117 with concurrent hypokalemia of 2.6 and hypomagnesium of 1.4. Patient was administered 1236mg  of intravenous Fosphenytoinand initiated on low rate normal saline infusion. Patient was administered 2 g of intravenous magnesium and a 30 mEq of potassium.  Patient's electrolytes have improved, no further seizure episodes at this time.  Echo showed EF of 25-30%, cardiology recommended outpatient follow-up but otherwise continue Coreg, torsemide and Entresto.    Subjective: Significant decrease in upper lip swelling and pain.  Objective: Vitals:   03/30/20 0344 03/30/20 0756  BP: 118/66 109/80  Pulse: 81 94  Resp: 18 16  Temp: 98 F (36.7 C) 98 F (36.7 C)  SpO2: 99% 99%    Intake/Output Summary (Last 24 hours) at 03/30/2020 1055 Last data filed at 03/30/2020 0911 Gross per 24 hour  Intake 1564.87 ml  Output 400 ml  Net 1164.87 ml   Filed Weights   03/25/20 0500 03/27/20 0444 03/30/20 0500  Weight: 92.7 kg (!) 93.2 kg (!) 92.1 kg    Exam:  Constitutional: NAD, calm, comfortable HEENT: Upper lip edema resolved, minimally tender frontal sinuses, patient has extremely poor dentition throughout mouth with multiple broken teeth in front aspect of upper gums.  There is also redness as well as a discolored area.  No drainage detected Respiratory: clear to auscultation bilaterally, Normal respiratory effort.  Room air Cardiovascular: Regular rate and rhythm, no murmurs / rubs / gallops. No extremity edema.  No JVD.  2+ pedal pulses.  Abdomen: no  tenderness, no masses palpated.  Bowel sounds positive.  Tolerating diet without difficulty Neurologic: CN 2-12 grossly intact.  Sensation intact, DTR normal. Strength 5/5 x all 4 extremities. Ambulates independently Psychiatric: Alert and oriented x 3 although does not have appropriate insight or capacity to manage care independently secondary to longstanding schizophrenia. Normal mood.    Assessment/Plan:  Hyponatremia with seizures: Intravascular dehydration, resolved Held home diuretic diuretics- torsemide, metolazone discontinued.   Na+ 133 on 7/21-repeat again on 7/22 was 132-this point can check prn Currently appears euvolemic  Lip edema/dental caries with abscess Patient developed low-grade fever and evolving upper lip swelling along with pain to upper gum, lips and frontal sinuses Orthopantogram revealed a lucency around the roots of right side posterior molar suggestive of possible infection Continue IV Unasyn D# 2/7  No signs of sepsis Tylenol for pain-if needed can give short course of Oxy IR if Tylenol does not relieve pain-given underlying mild renal insufficiency and severe systolic heart failure would avoid NSAIDs  Chronic congestive heart failure with reduced ejection fraction, 25%.-Associated right heart failure Euvolemic for several days but developed trace edema for was initiated on low-dose Lasix-sodium decreased slightly but otherwise has remained stable.   Continue low-dose Lasix  Echo in May 2021-EF 25-30% Continue Coreg, Entresto.   Daily weights with strict I's/O On heart healthy diet without fluid restriction Digoxin level subtherapeutic at <0.02 therefore dose increased to 0.125 mg daily-repeat level 7/25 also subtherapeutic but slightly higher than previous.  Given utilization for heart failure only patient otherwise stable from a cardiac standpoint no indication to continue to check levels Patient again instructed to minimize salt intake but is allowed to have one snack size bag of chips such as Fritos once per week, encouraged to sit recliner with feet elevated   Depression,  Suicidal ideations: Resolved, seen by psychiatry.  Recommended to continue BuSpar, Neurontin, Invega.   Follow-up outpatient psychiatry.   Received his routine Invega shot 7/8, takes this monthly-next dose due 8/8  Seizure disorder: Stable Secondary to hyponatremia and issues of nonadherence with medications prior to admission.   Dilantin level subtherapeutic on 7/18 at 2.6 - Dilantin dosage increased to 200 mg twice daily-repeat level on 7/25 also subtherapeutic to therefore dosage increased at recommendation of pharmacy to 200 in the morning and 300 at bedtime  Microcytic anemia Iron studies relatively normal, hemoglobin stable  OSA Continue nocturnal CPAP  COPD: stable. As needed bronchodilators  Hyperlipidemia: Daily Lipitor 40 mg  Hypokalemia/hypomagnesemia Replace as needed  Prolonged QTC:  Appears to have resolved.   Can you Seroquel 200 mg daily.  Neuropathy Currently on gabapentin 300 mg 3 TID   On 7/14 added a bedtime dose of 300 mg with improvement in discomfort  Modest obesity Estimated body mass index is 30.87 kg/m as calculated from the following:   Height as of this encounter: 5\' 8"  (1.727 m).   Weight as of this encounter: 92.1 kg.      Data Reviewed: Basic Metabolic Panel: Recent Labs  Lab 03/24/20 0632 03/25/20 0509 03/28/20 0631  NA 133* 132* 131*  K 4.0 4.0 4.1  CL 98 99 97*  CO2 26 25 26   GLUCOSE 88 87 120*  BUN 10 8 15   CREATININE 0.53* 0.54* 0.79  CALCIUM 9.4 9.3 9.1   Liver Function Tests: No results for input(s): AST, ALT, ALKPHOS, BILITOT, PROT, ALBUMIN in the last 168 hours. No results for input(s): LIPASE, AMYLASE in the last 168 hours. No results for input(s): AMMONIA  in the last 168 hours. CBC: Recent Labs  Lab 03/28/20 0631  HGB 10.5*  HCT 33.3*   Cardiac Enzymes: No results for input(s): CKTOTAL, CKMB, CKMBINDEX, TROPONINI in the last 168 hours. BNP (last 3 results) Recent Labs    01/22/20 0410  02/24/20 0030  BNP 697.9* 184.7*    ProBNP (last 3 results) No results for input(s): PROBNP in the last 8760 hours.  CBG: No results for input(s): GLUCAP in the last 168 hours.  No results found for this or any previous visit (from the past 240 hour(s)).   Studies: DG Orthopantogram  Result Date: 03/29/2020 CLINICAL DATA:  Dental caries. EXAM: ORTHOPANTOGRAM/PANORAMIC COMPARISON:  None. FINDINGS: No fracture or dislocation is noted. Extremely poor dentition is noted. There is noted lucency around the roots of right-sided posterior molar in the mandible suggesting possible infection. IMPRESSION: Lucency is noted around the roots of right-sided posterior molar in the mandible suggesting possible infection. No other abnormality is noted in the visualized portions of the mandible. Electronically Signed   By: Lupita Raider M.D.   On: 03/29/2020 09:56    Scheduled Meds: . atorvastatin  40 mg Oral Daily  . busPIRone  10 mg Oral TID  . carvedilol  3.125 mg Oral BID WC  . digoxin  0.125 mg Oral Daily  . enoxaparin (LOVENOX) injection  40 mg Subcutaneous Daily  . fluticasone furoate-vilanterol  1 puff Inhalation Daily  . furosemide  40 mg Oral Daily  . gabapentin  300 mg Oral QID  . phenytoin  200 mg Oral Daily  . phenytoin  300 mg Oral QHS  . QUEtiapine  200 mg Oral Daily  . sacubitril-valsartan  1 tablet Oral BID   Continuous Infusions: . ampicillin-sulbactam (UNASYN) IV 3 g (03/30/20 0906)    Principal Problem:   Hyponatremia Active Problems:   Chronic systolic CHF (congestive heart failure) (HCC)   Mixed hyperlipidemia   COPD (chronic obstructive pulmonary disease) (HCC)   Seizure disorder (HCC)   Paranoid schizophrenia (HCC)   Hypokalemia   Hypomagnesemia   Suicidal ideation   Noncompliance with medications   Subtherapeutic serum dilantin level   Seizure (HCC)        Consultants:  None  Procedures:  None  Antibiotics: Anti-infectives (From admission,  onward)   Start     Dose/Rate Route Frequency Ordered Stop   03/29/20 0930  Ampicillin-Sulbactam (UNASYN) 3 g in sodium chloride 0.9 % 100 mL IVPB     Discontinue     3 g 200 mL/hr over 30 Minutes Intravenous Every 6 hours 03/29/20 0915         Time spent: 10 minutes    Junious Silk ANP  [facility] Pager [phone]. If 7PM-7AM, please contact night-coverage at [email or web address] 03/30/2020, 10:55 AM  LOS: 36 days

## 2020-03-31 NOTE — Progress Notes (Signed)
[facility] PROGRESS NOTE  Jim Dunn TMA:[id number] DOB: 27-Aug-1966 DOA: 02/23/2020 PCP: System, Pcp Not In  Status: Inpatient---Remains inpatient appropriate because:Altered mental status and Unsafe d/c plan   Dispo: The patient is from: Home              Anticipated d/c is to: Group home              Anticipated d/c date is: > 3 days              Patient currently is medically stable to d/c.  Currently awaiting placement, TOC team working on this.  Mother is currently working to obtain Medicaid/guardianship.  As of 7/21 she has submitted all required paperwork to [facility] for [facility] in another county but needs clarification regarding which [place] he should actually apply for. Unsafe for discharge based on underlying schizophrenia and history of recurrent noncompliance was which led to patient's current hospitalization  Discussed with LCSW/case management.  Given patient's current medical diagnosis he is not eligible for Medicaid but would be eligible for home which is patient's discharge preference.  Given this would need disability funds to provide payment we are looking at group homes outside the [facility] area preferably in [facility] where patient previously resided.  Code Status: Full Family Communication: 7/28 updated [name] by telephone [phone] or [phone] DVT prophylaxis: Lovenox Vaccination status: Vaccinated   HPI: 54 year old male with past medical history of schizophrenia, seizure disorder, biventricular heart failure(Echo 01/2020 EF 25-30%with right-sided heart failure),severe tricuspid regurgitation, nicotine dependence and COPD who presents to [facility] emergency department with suicidal ideation and seizure.Patient explains that since he left the hospital several days ago(admitted6/17-6/19-was being managed for hyponatremia, thought to be multifactorial in origin. Patient left AMA on6/19 and that time had  sodium of 132),he has been experiencing increasing fatigue. This increasing fatigue is associated with bilateral lower extremity tingling. Symptomsweresimilar to what prompted his presentation on 6/17. Additionally, he reported feeling depressed withsuicidal ideation, withouta plan. Patient reportedthat he hadattempted suicide in the distant past using pills but was unsuccessful and never sought medical attention. While the patient was waiting in the waiting room, he experienced a witnessed seizure while attempting to walk to the bathroom. Patient was immediately brought back to the emergency department for further evaluation. Patient was found to have substantial recurrent hyponatremia of 117 with concurrent hypokalemia of 2.6 and hypomagnesium of 1.4. Patient was administered 1236mg  of intravenous Fosphenytoinand initiated on low rate normal saline infusion. Patient was administered 2 g of intravenous magnesium and a 30 mEq of potassium.  Patient's electrolytes have improved, no further seizure episodes at this time.  Echo showed EF of 25-30%, cardiology recommended outpatient follow-up but otherwise continue Coreg, torsemide and Entresto.    Subjective: No complaints.  Denied that lip or facial pain was reason that he did not wear his BiPAP overnight and he stated "I just did not want to wear it"  Objective: Vitals:   03/31/20 0745 03/31/20 0839  BP: (!) 138/88   Pulse: 82   Resp: 16   Temp: 98 F (36.7 C)   SpO2: 99% 99%    Intake/Output Summary (Last 24 hours) at 03/31/2020 1233 Last data filed at 03/31/2020 0748 Gross per 24 hour  Intake --  Output 475 ml  Net -475 ml   Filed Weights   03/25/20 0500 03/27/20 0444 03/30/20 0500  Weight: 92.7 kg (!) 93.2 kg (!) 92.1 kg    Exam:  Constitutional: NAD, calm, comfortable HEENT: Upper lip edema and frontal sinus pain resolved-patient has extremely poor dentition throughout mouth with multiple broken teeth in front aspect  of upper gums.   Respiratory: clear to auscultation bilaterally, Normal respiratory effort.  Room air Cardiovascular: Regular rate and rhythm, no murmurs / rubs / gallops. No extremity edema.  No JVD.  2+ pedal pulses.  Abdomen: no tenderness, no masses palpated.  Bowel sounds positive.  Tolerating diet without difficulty Neurologic: CN 2-12 grossly intact. Sensation intact, DTR normal. Strength 5/5 x all 4 extremities. Ambulates independently Psychiatric: Alert and oriented x 3 although does not have appropriate insight or capacity to manage care independently secondary to longstanding schizophrenia. Normal mood.    Assessment/Plan:  Hyponatremia with seizures: Intravascular dehydration, resolved Held home diuretic diuretics- torsemide, metolazone discontinued.   Na+ 133 on 7/21-repeat again on 7/22 was 132-this point can check prn Currently appears euvolemic  Lip edema/dental caries with abscess Patient developed low-grade fever and evolving upper lip swelling along with pain to upper gum, lips and frontal sinuses Orthopantogram revealed a lucency around the roots of right side posterior molar suggestive of possible infection Continue IV Unasyn D# 3/7  No signs of sepsis Tylenol for pain-if needed can give short course of Oxy IR if Tylenol does not relieve pain-given underlying mild renal insufficiency and severe systolic heart failure would avoid NSAIDs  Chronic congestive heart failure with reduced ejection fraction, 25%.-Associated right heart failure Euvolemic for several days but developed trace edema for was initiated on low-dose Lasix-sodium decreased slightly but otherwise has remained stable.   Continue low-dose Lasix  Echo in May 2021-EF 25-30% Continue Coreg, Entresto.   Daily weights with strict I's/O On heart healthy diet without fluid restriction Digoxin level subtherapeutic at <0.02 therefore dose increased to 0.125 mg daily-repeat level 7/25 also subtherapeutic but  slightly higher than previous.  Given utilization for heart failure only patient otherwise stable from a cardiac standpoint no indication to continue to check levels Patient again instructed to minimize salt intake but is allowed to have one snack size bag of chips such as Fritos once per week, encouraged to sit recliner with feet elevated   Depression, Suicidal ideations: Resolved, seen by psychiatry.  Recommended to continue BuSpar, Neurontin, Invega.   Follow-up outpatient psychiatry.   Received his routine Invega shot 7/8, takes this monthly-next dose due 8/8  Seizure disorder: Stable Secondary to hyponatremia and issues of nonadherence with medications prior to admission.   Dilantin level subtherapeutic on 7/18 at 2.6 - Dilantin dosage increased to 200 mg twice daily-repeat level on 7/25 also subtherapeutic to therefore dosage increased at recommendation of pharmacy to 200 in the morning and 300 at bedtime  Microcytic anemia Iron studies relatively normal, hemoglobin stable  OSA Continue nocturnal CPAP  COPD: stable. As needed bronchodilators  Hyperlipidemia: Daily Lipitor 40 mg  Hypokalemia/hypomagnesemia Replace as needed  Prolonged QTC:  Appears to have resolved.   Can you Seroquel 200 mg daily.  Neuropathy Currently on gabapentin 300 mg 3 TID   On 7/14 added a bedtime dose of 300 mg with improvement in discomfort  Modest obesity Estimated body mass index is 30.87 kg/m as calculated from the following:   Height as of this encounter: 5\' 8"  (1.727 m).   Weight as of this encounter: 92.1 kg.      Data Reviewed: Basic Metabolic Panel: Recent Labs  Lab 03/25/20 0509 03/28/20 0631  NA 132* 131*  K 4.0 4.1  CL 99 97*  CO2 25 26  GLUCOSE 87 120*  BUN 8 15  CREATININE 0.54* 0.79  CALCIUM 9.3 9.1   Liver Function Tests: No results for input(s): AST, ALT, ALKPHOS, BILITOT, PROT, ALBUMIN in the last 168 hours. No results for input(s): LIPASE,  AMYLASE in the last 168 hours. No results for input(s): AMMONIA in the last 168 hours. CBC: Recent Labs  Lab 03/28/20 0631  HGB 10.5*  HCT 33.3*   Cardiac Enzymes: No results for input(s): CKTOTAL, CKMB, CKMBINDEX, TROPONINI in the last 168 hours. BNP (last 3 results) Recent Labs    01/22/20 0410 02/24/20 0030  BNP 697.9* 184.7*    ProBNP (last 3 results) No results for input(s): PROBNP in the last 8760 hours.  CBG: No results for input(s): GLUCAP in the last 168 hours.  No results found for this or any previous visit (from the past 240 hour(s)).   Studies: No results found.  Scheduled Meds: . atorvastatin  40 mg Oral Daily  . busPIRone  10 mg Oral TID  . carvedilol  3.125 mg Oral BID WC  . digoxin  0.125 mg Oral Daily  . enoxaparin (LOVENOX) injection  40 mg Subcutaneous Daily  . fluticasone furoate-vilanterol  1 puff Inhalation Daily  . furosemide  40 mg Oral Daily  . gabapentin  300 mg Oral QID  . phenytoin  200 mg Oral Daily  . phenytoin  300 mg Oral QHS  . QUEtiapine  200 mg Oral Daily  . sacubitril-valsartan  1 tablet Oral BID   Continuous Infusions: . ampicillin-sulbactam (UNASYN) IV 3 g (03/31/20 4081)    Principal Problem:   Hyponatremia Active Problems:   Chronic systolic CHF (congestive heart failure) (HCC)   Mixed hyperlipidemia   COPD (chronic obstructive pulmonary disease) (HCC)   Seizure disorder (HCC)   Paranoid schizophrenia (HCC)   Hypokalemia   Hypomagnesemia   Suicidal ideation   Noncompliance with medications   Subtherapeutic serum dilantin level   Seizure (HCC)        Consultants:  None  Procedures:  None  Antibiotics: Anti-infectives (From admission, onward)   Start     Dose/Rate Route Frequency Ordered Stop   03/29/20 0930  Ampicillin-Sulbactam (UNASYN) 3 g in sodium chloride 0.9 % 100 mL IVPB     Discontinue     3 g 200 mL/hr over 30 Minutes Intravenous Every 6 hours 03/29/20 0915         Time spent: 10  minutes    Junious Silk ANP  [facility] Pager [phone]. If 7PM-7AM, please contact night-coverage at [email or web address] 03/31/2020, 12:33 PM  LOS: 37 days

## 2020-03-31 NOTE — Plan of Care (Signed)
progressing 

## 2020-03-31 NOTE — Progress Notes (Signed)
Patient refused his bipap for tonight. He normally uses it but said he would not tonight.

## 2020-04-01 NOTE — Progress Notes (Addendum)
Pharmacy Antibiotic Note  Jim Dunn is a 54 y.o. male admitted on 02/23/2020 with a lip edema/ dental caries with oral abscess.  Pharmacy  consulted for unasyn dosing for oral abscess.  Orthopantogram revealed a lucency around the roots of right side posterior molar suggestive of possible infection.  Day #4 of 7 Unasyn.  SCr within normal limits.  Oral abscess resolving, no signs of sepsis per MD assessment  Plan: Unasyn 3gm IV q6h, MD notes plan x 7 days total.  End date 8/2 after AM dose. Monitor for clinical course  Height: 5\' 8"  (172.7 cm) Weight: (!) 92.1 kg (203 lb 0.7 oz) IBW/kg (Calculated) : 68.4  Temp (24hrs), Avg:98.9 F (37.2 C), Min:98.5 F (36.9 C), Max:99.4 F (37.4 C)  Recent Labs  Lab 03/28/20 0631  CREATININE 0.79    Estimated Creatinine Clearance: 119 mL/min (by C-G formula based on SCr of 0.79 mg/dL).    No Known Allergies  Antimicrobials this admission: 7/26 Unasyn >>    Dose adjustments this admission: n/a    8/26, RPh Clinical Pharmacist [place] Please utilize Amion for appropriate phone number to reach the unit pharmacist [facility])   04/01/2020 1:14 PM

## 2020-04-01 NOTE — Plan of Care (Signed)
°  Problem: Safety: Goal: Verbalization of understanding the information provided will improve Outcome: Progressing   Problem: Self-Concept: Goal: Level of anxiety will decrease Outcome: Progressing   Problem: Activity: Goal: Risk for activity intolerance will decrease Outcome: Progressing   Problem: Coping: Goal: Level of anxiety will decrease Outcome: Progressing

## 2020-04-01 NOTE — Progress Notes (Signed)
[facility] PROGRESS NOTE  Jim Dunn OAC:[id number] DOB: May 17, 1966 DOA: 02/23/2020 PCP: System, Pcp Not In  Status: Inpatient---Remains inpatient appropriate because:Altered mental status and Unsafe d/c plan   Dispo: The patient is from: Home              Anticipated d/c is to: Group home              Anticipated d/c date is: > 3 days              Patient currently is medically stable to d/c.  Currently awaiting placement, TOC team working on this.  Mother is currently working to obtain Medicaid/guardianship.  As of 7/21 she has submitted all required paperwork to [facility] for [facility] in another county but needs clarification regarding which [place] he should actually apply for. Unsafe for discharge based on underlying schizophrenia and history of recurrent noncompliance was which led to patient's current hospitalization  Discussed with LCSW/case management.  Given patient's current medical diagnosis he is not eligible for Medicaid but would be eligible for home which is patient's discharge preference.  Given this would need disability funds to provide payment we are looking at group homes outside the [facility] area preferably in [facility] where patient previously resided.  Code Status: Full Family Communication: 7/28 updated [name] by telephone [phone] or [phone] DVT prophylaxis: Lovenox Vaccination status: Vaccinated   HPI: 54 year old male with past medical history of schizophrenia, seizure disorder, biventricular heart failure(Echo 01/2020 EF 25-30%with right-sided heart failure),severe tricuspid regurgitation, nicotine dependence and COPD who presents to [facility] emergency department with suicidal ideation and seizure.Patient explains that since he left the hospital several days ago(admitted6/17-6/19-was being managed for hyponatremia, thought to be multifactorial in origin. Patient left AMA on6/19 and that time had  sodium of 132),he has been experiencing increasing fatigue. This increasing fatigue is associated with bilateral lower extremity tingling. Symptomsweresimilar to what prompted his presentation on 6/17. Additionally, he reported feeling depressed withsuicidal ideation, withouta plan. Patient reportedthat he hadattempted suicide in the distant past using pills but was unsuccessful and never sought medical attention. While the patient was waiting in the waiting room, he experienced a witnessed seizure while attempting to walk to the bathroom. Patient was immediately brought back to the emergency department for further evaluation. Patient was found to have substantial recurrent hyponatremia of 117 with concurrent hypokalemia of 2.6 and hypomagnesium of 1.4. Patient was administered 1236mg  of intravenous Fosphenytoinand initiated on low rate normal saline infusion. Patient was administered 2 g of intravenous magnesium and a 30 mEq of potassium.  Patient's electrolytes have improved, no further seizure episodes at this time.  Echo showed EF of 25-30%, cardiology recommended outpatient follow-up but otherwise continue Coreg, torsemide and Entresto.    Subjective: Stable.  No difficulty resting overnight. Glad discharge plan has changed to group home as opposed to ALF  Objective: Vitals:   04/01/20 0723 04/01/20 0925  BP:    Pulse:  88  Resp:    Temp:    SpO2: 95%    No intake or output data in the 24 hours ending 04/01/20 1029 Filed Weights   03/25/20 0500 03/27/20 0444 03/30/20 0500  Weight: 92.7 kg (!) 93.2 kg (!) 92.1 kg    Exam:  Constitutional: NAD, calm, comfortable HEENT: Extremely poor dentition throughout mouth with multiple broken teeth in front aspect of upper gums.   Respiratory: clear to auscultation bilaterally, Normal respiratory effort.  Room air Cardiovascular: Regular rate  and rhythm, no murmurs / rubs / gallops. No lower extremity edema.  No JVD.   Abdomen: no  tenderness, no masses palpated.  Bowel sounds positive.  Tolerating diet without difficulty Neurologic: CN 2-12 grossly intact. Sensation intact, DTR normal. Strength 5/5 x all 4 extremities. Ambulates independently Psychiatric: Alert and oriented x 3 although does not have appropriate insight or capacity to manage care independently secondary to longstanding schizophrenia. Normal mood.    Assessment/Plan:  Hyponatremia with seizures: Intravascular dehydration, resolved Held home diuretic diuretics- torsemide, metolazone discontinued.   Na+ 133 on 7/21-repeat again on 7/22 was 132-this point can check prn Currently appears euvolemic  Lip edema/dental caries with abscess Resolving Patient developed low-grade fever and evolving upper lip swelling along with pain to upper gum, lips and frontal sinuses Orthopantogram revealed a lucency around the roots of right side posterior molar suggestive of possible infection Continue IV Unasyn D# 4/7  No signs of sepsis Tylenol for pain-if needed can give short course of Oxy IR if Tylenol does not relieve pain-given underlying mild renal insufficiency and severe systolic heart failure would avoid NSAIDs  Chronic congestive heart failure with reduced ejection fraction, 25%.-Associated right heart failure Euvolemic for several days but developed trace edema for was initiated on low-dose Lasix-sodium decreased slightly but otherwise has remained stable.   Continue low-dose Lasix  Echo in May 2021-EF 25-30% Continue Coreg, Entresto.   Daily weights with strict I's/O On heart healthy diet without fluid restriction Digoxin level subtherapeutic at <0.02 therefore dose increased to 0.125 mg daily-repeat level 7/25 also subtherapeutic but slightly higher than previous.  Given utilization for heart failure only patient otherwise stable from a cardiac standpoint no indication to continue to check levels Patient again instructed to minimize salt intake but is  allowed to have one snack size bag of chips such as Fritos once per week, encouraged to sit recliner with feet elevated   Depression, Suicidal ideations: Resolved, seen by psychiatry.  Recommended to continue BuSpar, Neurontin, Invega.   Follow-up outpatient psychiatry.   Received his routine Invega shot 7/8, takes this monthly-next dose due 8/8  Seizure disorder: Stable Secondary to hyponatremia and issues of nonadherence with medications prior to admission.   Dilantin level subtherapeutic on 7/18 at 2.6 - Dilantin dosage increased to 200 mg twice daily-repeat level on 7/25 also subtherapeutic to therefore dosage increased at recommendation of pharmacy to 200 in the morning and 300 at bedtime Repeat Dilantin level scheduled for 7/30  Microcytic anemia Iron studies relatively normal, hemoglobin stable  OSA Continue nocturnal CPAP  COPD: stable. As needed bronchodilators  Hyperlipidemia: Daily Lipitor 40 mg  Hypokalemia/hypomagnesemia Replace as needed  Prolonged QTC:  Appears to have resolved.   Can you Seroquel 200 mg daily.  Neuropathy Currently on gabapentin 300 mg 3 TID   On 7/14 added a bedtime dose of 300 mg with improvement in discomfort  Modest obesity Estimated body mass index is 30.87 kg/m as calculated from the following:   Height as of this encounter: 5\' 8"  (1.727 m).   Weight as of this encounter: 92.1 kg.      Data Reviewed: Basic Metabolic Panel: Recent Labs  Lab 03/28/20 0631  NA 131*  K 4.1  CL 97*  CO2 26  GLUCOSE 120*  BUN 15  CREATININE 0.79  CALCIUM 9.1   Liver Function Tests: No results for input(s): AST, ALT, ALKPHOS, BILITOT, PROT, ALBUMIN in the last 168 hours. No results for input(s): LIPASE, AMYLASE in the last 168  hours. No results for input(s): AMMONIA in the last 168 hours. CBC: Recent Labs  Lab 03/28/20 0631  HGB 10.5*  HCT 33.3*   Cardiac Enzymes: No results for input(s): CKTOTAL, CKMB, CKMBINDEX,  TROPONINI in the last 168 hours. BNP (last 3 results) Recent Labs    01/22/20 0410 02/24/20 0030  BNP 697.9* 184.7*    ProBNP (last 3 results) No results for input(s): PROBNP in the last 8760 hours.  CBG: No results for input(s): GLUCAP in the last 168 hours.  No results found for this or any previous visit (from the past 240 hour(s)).   Studies: No results found.  Scheduled Meds: . atorvastatin  40 mg Oral Daily  . busPIRone  10 mg Oral TID  . carvedilol  3.125 mg Oral BID WC  . digoxin  0.125 mg Oral Daily  . enoxaparin (LOVENOX) injection  40 mg Subcutaneous Daily  . fluticasone furoate-vilanterol  1 puff Inhalation Daily  . furosemide  40 mg Oral Daily  . gabapentin  300 mg Oral QID  . phenytoin  200 mg Oral Daily  . phenytoin  300 mg Oral QHS  . QUEtiapine  200 mg Oral Daily  . sacubitril-valsartan  1 tablet Oral BID   Continuous Infusions: . ampicillin-sulbactam (UNASYN) IV 3 g (04/01/20 0927)    Principal Problem:   Hyponatremia Active Problems:   Chronic systolic CHF (congestive heart failure) (HCC)   Mixed hyperlipidemia   COPD (chronic obstructive pulmonary disease) (HCC)   Seizure disorder (HCC)   Paranoid schizophrenia (HCC)   Hypokalemia   Hypomagnesemia   Suicidal ideation   Noncompliance with medications   Subtherapeutic serum dilantin level   Seizure (HCC)        Consultants:  None  Procedures:  None  Antibiotics: Anti-infectives (From admission, onward)   Start     Dose/Rate Route Frequency Ordered Stop   03/29/20 0930  Ampicillin-Sulbactam (UNASYN) 3 g in sodium chloride 0.9 % 100 mL IVPB     Discontinue     3 g 200 mL/hr over 30 Minutes Intravenous Every 6 hours 03/29/20 0915         Time spent: 10 minutes    Junious Silk ANP  [facility] Pager [phone]. If 7PM-7AM, please contact night-coverage at [email or web address] 04/01/2020, 10:29 AM  LOS: 38 days

## 2020-04-01 NOTE — Progress Notes (Signed)
Patient continues to refuse CPAP for HS.

## 2020-04-02 LAB — PHENYTOIN LEVEL, TOTAL: Phenytoin Lvl: 6.4 ug/mL — ABNORMAL LOW (ref 10.0–20.0)

## 2020-04-02 MED ORDER — AMOXICILLIN-POT CLAVULANATE 875-125 MG PO TABS
1.0000 | ORAL_TABLET | Freq: Two times a day (BID) | ORAL | Status: AC
  Administered 2020-04-02 – 2020-04-04 (×5): 1 via ORAL
  Filled 2020-04-02 (×5): qty 1

## 2020-04-02 MED ORDER — PHENYTOIN SODIUM EXTENDED 100 MG PO CAPS
300.0000 mg | ORAL_CAPSULE | Freq: Two times a day (BID) | ORAL | Status: DC
  Administered 2020-04-02 – 2020-06-08 (×134): 300 mg via ORAL
  Filled 2020-04-02 (×135): qty 3

## 2020-04-02 NOTE — Progress Notes (Addendum)
[facility] PROGRESS NOTE  Jim Dunn RFF:[id number] DOB: Sep 11, 1965 DOA: 02/23/2020 PCP: System, Pcp Not In  Status: Inpatient---Remains inpatient appropriate because:Altered mental status and Unsafe d/c plan   Dispo: The patient is from: Home              Anticipated d/c is to: Group home              Anticipated d/c date is: > 3 days              Patient currently is medically stable to d/c.  Currently awaiting placement, TOC team working on this.  Mother is currently working to obtain Medicaid/guardianship.  As of 7/21 she has submitted all required paperwork to [facility] for [facility] in another county but needs clarification regarding which [place] he should actually apply for. Unsafe for discharge based on underlying schizophrenia and history of recurrent noncompliance was which led to patient's current hospitalization  Discussed with LCSW/case management.  Given patient's current medical diagnosis he is not eligible for Medicaid but would be eligible for home which is patient's discharge preference.  Given this would need disability funds to provide payment we are looking at group homes outside the [facility] area preferably in [facility] where patient previously resided.  Code Status: Full Family Communication: 7/28 updated [name] by telephone [phone] or [phone] DVT prophylaxis: Lovenox Vaccination status: Vaccinated   HPI: 54 year old male with past medical history of schizophrenia, seizure disorder, biventricular heart failure(Echo 01/2020 EF 25-30%with right-sided heart failure),severe tricuspid regurgitation, nicotine dependence and COPD who presents to [facility] emergency department with suicidal ideation and seizure.Patient explains that since he left the hospital several days ago(admitted6/17-6/19-was being managed for hyponatremia, thought to be multifactorial in origin. Patient left AMA on6/19 and that time had  sodium of 132),he has been experiencing increasing fatigue. This increasing fatigue is associated with bilateral lower extremity tingling. Symptomsweresimilar to what prompted his presentation on 6/17. Additionally, he reported feeling depressed withsuicidal ideation, withouta plan. Patient reportedthat he hadattempted suicide in the distant past using pills but was unsuccessful and never sought medical attention. While the patient was waiting in the waiting room, he experienced a witnessed seizure while attempting to walk to the bathroom. Patient was immediately brought back to the emergency department for further evaluation. Patient was found to have substantial recurrent hyponatremia of 117 with concurrent hypokalemia of 2.6 and hypomagnesium of 1.4. Patient was administered 1236mg  of intravenous Fosphenytoinand initiated on low rate normal saline infusion. Patient was administered 2 g of intravenous magnesium and a 30 mEq of potassium.  Patient's electrolytes have improved, no further seizure episodes at this time.  Echo showed EF of 25-30%, cardiology recommended outpatient follow-up but otherwise continue Coreg, torsemide and Entresto.    Subjective: Denies shortness of breath or chest discomfort. States he has money and would like to be able to leave unit to purchase a salad in the cafeteria if he is hungry.  Informed him that he could buy a salad but no other junk food.  Order placed in computer Discussed with patient his recurrent refusal to not utilize CPAP at bedtime.  Explained to him abnormal physiology that would result including heart failure exacerbation if he does not use CPAP regularly.  He is also aware plan to continue CPAP after discharge.  Objective: Vitals:   04/02/20 0743 04/02/20 0826  BP: (!) 132/79   Pulse: 75   Resp: 20   Temp: 98.5 F (36.9 C)  SpO2: 99% 97%    Intake/Output Summary (Last 24 hours) at 04/02/2020 1053 Last data filed at 04/01/2020  1150 Gross per 24 hour  Intake --  Output 600 ml  Net -600 ml   Filed Weights   03/25/20 0500 03/27/20 0444 03/30/20 0500  Weight: 92.7 kg (!) 93.2 kg (!) 92.1 kg    Exam:  Constitutional: NAD, calm, comfortable HEENT: Extremely poor dentition throughout mouth with multiple broken teeth in front aspect of upper gums.   Respiratory: clear to auscultation bilaterally, Normal respiratory effort.  Room air Cardiovascular: Regular rate and rhythm, no murmurs / rubs / gallops. No lower extremity edema.  No JVD.   Abdomen: no tenderness, no masses palpated.  Bowel sounds positive.  Tolerating diet without difficulty Neurologic: CN 2-12 grossly intact. Sensation intact,Strength 5/5 x all 4 extremities. Ambulates independently Psychiatric: Alert and oriented x 3 although does not have appropriate insight or capacity to manage care independently secondary to longstanding schizophrenia.  Pleasant mood.    Assessment/Plan:  Hyponatremia with seizures: Intravascular dehydration, resolved Held home diuretic diuretics- torsemide, metolazone discontinued.   Na+ 133 on 7/21-repeat again on 7/22 was 132-this point can check prn Currently appears euvolemic We will repeat electrolyte panel tomorrow on 7/31  Lip edema/dental caries with abscess Resolving Patient developed low-grade fever and evolving upper lip swelling along with pain to upper gum, lips and frontal sinuses Orthopantogram revealed a lucency around the roots of right side posterior molar suggestive of possible infection 7/30 IV Unasyn D# 4/7 -will transition to Augmentin No signs of sepsis Tylenol for pain-if needed can give short course of Oxy IR if Tylenol does not relieve pain-given underlying mild renal insufficiency and severe systolic heart failure would avoid NSAIDs  Chronic congestive heart failure with reduced ejection fraction, 25%.-Associated right heart failure Euvolemic for several days but developed trace edema for  was initiated on low-dose Lasix-sodium decreased slightly but otherwise has remained stable.   Continue low-dose Lasix  Echo in May 2021-EF 25-30% Continue Coreg, Entresto.   Daily weights with strict I's/O On heart healthy diet without fluid restriction Digoxin level subtherapeutic at <0.02 therefore dose increased to 0.125 mg daily-repeat level 7/25 also subtherapeutic but slightly higher than previous.  Given utilization for heart failure only patient otherwise stable from a cardiac standpoint no indication to continue to check levels Patient again instructed to minimize salt intake but is allowed to have one snack size bag of chips such as Fritos once per week, encouraged to sit recliner with feet elevated   Depression, Suicidal ideations: Resolved, seen by psychiatry.  Recommended to continue BuSpar, Neurontin, Invega.   Follow-up outpatient psychiatry.   Received his routine Invega shot 7/8, takes this monthly-next dose due 8/8  Seizure disorder: Stable Secondary to hyponatremia and issues of nonadherence with medications prior to admission.   Dilantin level subtherapeutic on 7/18 at 2.6 - Dilantin dosage increased to 200 mg twice daily-repeat level on 7/25 also subtherapeutic to therefore dosage increased at recommendation of pharmacy to 200 in the morning and 300 at bedtime Repeat Dilantin level 7/30 has increased but still remains low at 6.4-discussed with pharmacist and have increased a.m. dose from 200 to 300 mg  Microcytic anemia Iron studies relatively normal, hemoglobin stable  OSA Continue nocturnal CPAP-the above regarding patient noncompliant  COPD: stable. As needed bronchodilators  Hyperlipidemia: Daily Lipitor 40 mg  Hypokalemia/hypomagnesemia Replace as needed  Prolonged QTC:  Appears to have resolved.   Can you Seroquel 200 mg daily.  Neuropathy Currently on gabapentin 300 mg 3 TID   On 7/14 added a bedtime dose of 300 mg with improvement in  discomfort  Modest obesity Estimated body mass index is 30.87 kg/m as calculated from the following:   Height as of this encounter: 5\' 8"  (1.727 m).   Weight as of this encounter: 92.1 kg.      Data Reviewed: Basic Metabolic Panel: Recent Labs  Lab 03/28/20 0631  NA 131*  K 4.1  CL 97*  CO2 26  GLUCOSE 120*  BUN 15  CREATININE 0.79  CALCIUM 9.1   Liver Function Tests: No results for input(s): AST, ALT, ALKPHOS, BILITOT, PROT, ALBUMIN in the last 168 hours. No results for input(s): LIPASE, AMYLASE in the last 168 hours. No results for input(s): AMMONIA in the last 168 hours. CBC: Recent Labs  Lab 03/28/20 0631  HGB 10.5*  HCT 33.3*   Cardiac Enzymes: No results for input(s): CKTOTAL, CKMB, CKMBINDEX, TROPONINI in the last 168 hours. BNP (last 3 results) Recent Labs    01/22/20 0410 02/24/20 0030  BNP 697.9* 184.7*    ProBNP (last 3 results) No results for input(s): PROBNP in the last 8760 hours.  CBG: No results for input(s): GLUCAP in the last 168 hours.  No results found for this or any previous visit (from the past 240 hour(s)).   Studies: No results found.  Scheduled Meds: . atorvastatin  40 mg Oral Daily  . busPIRone  10 mg Oral TID  . carvedilol  3.125 mg Oral BID WC  . digoxin  0.125 mg Oral Daily  . enoxaparin (LOVENOX) injection  40 mg Subcutaneous Daily  . fluticasone furoate-vilanterol  1 puff Inhalation Daily  . furosemide  40 mg Oral Daily  . gabapentin  300 mg Oral QID  . phenytoin  200 mg Oral Daily  . phenytoin  300 mg Oral QHS  . QUEtiapine  200 mg Oral Daily  . sacubitril-valsartan  1 tablet Oral BID   Continuous Infusions: . ampicillin-sulbactam (UNASYN) IV 3 g (04/02/20 0455)    Principal Problem:   Hyponatremia Active Problems:   Chronic systolic CHF (congestive heart failure) (HCC)   Mixed hyperlipidemia   COPD (chronic obstructive pulmonary disease) (HCC)   Seizure disorder (HCC)   Paranoid schizophrenia (HCC)    Hypokalemia   Hypomagnesemia   Suicidal ideation   Noncompliance with medications   Subtherapeutic serum dilantin level   Seizure (HCC)        Consultants:  None  Procedures:  None  Antibiotics: Anti-infectives (From admission, onward)   Start     Dose/Rate Route Frequency Ordered Stop   03/29/20 0930  Ampicillin-Sulbactam (UNASYN) 3 g in sodium chloride 0.9 % 100 mL IVPB     Discontinue     3 g 200 mL/hr over 30 Minutes Intravenous Every 6 hours 03/29/20 0915 04/05/20 0959       Time spent: 10 minutes    06/05/20 ANP  [facility] Pager [phone]. If 7PM-7AM, please contact night-coverage at [email or web address] 04/02/2020, 10:53 AM  LOS: 39 days

## 2020-04-02 NOTE — Progress Notes (Addendum)
RT placed patient on CPAP HS. NO O2 bleed in needed. Patient tolerating well at this time.  

## 2020-04-03 LAB — BASIC METABOLIC PANEL
Anion gap: 10 (ref 5–15)
BUN: 9 mg/dL (ref 6–20)
CO2: 23 mmol/L (ref 22–32)
Calcium: 9.3 mg/dL (ref 8.9–10.3)
Chloride: 98 mmol/L (ref 98–111)
Creatinine, Ser: 0.52 mg/dL — ABNORMAL LOW (ref 0.61–1.24)
GFR calc Af Amer: >60 mL/min (ref >60)
GFR calc non Af Amer: >60 mL/min (ref >60)
Glucose, Bld: 155 mg/dL — ABNORMAL HIGH (ref 70–99)
Potassium: 3.8 mmol/L (ref 3.5–5.1)
Sodium: 131 mmol/L — ABNORMAL LOW (ref 135–145)

## 2020-04-03 NOTE — Progress Notes (Signed)
[facility] PROGRESS NOTE  Jim Dunn WJX:[id number] DOB: [birth year] DOA: 02/23/2020 PCP: System, Pcp Not In  Status: Inpatient---Remains inpatient appropriate because:Altered mental status and Unsafe d/c plan   Dispo: The patient is from: Home              Anticipated d/c is to: Group home              Anticipated d/c date is: > 3 days              Patient currently is medically stable to d/c.  Currently awaiting placement, TOC team working on this.  Mother is currently working to obtain Medicaid/guardianship.  As of 7/21 she has submitted all required paperwork to [facility] for [facility] in another county but needs clarification regarding which [place] he should actually apply for. Unsafe for discharge based on underlying schizophrenia and history of recurrent noncompliance was which led to patient's current hospitalization  Discussed with LCSW/case management.  Given patient's current medical diagnosis he is not eligible for Medicaid but would be eligible for home which is patient's discharge preference.  Given this would need disability funds to provide payment we are looking at group homes outside the [facility] area preferably in [facility] where patient previously resided.  Code Status: Full Family Communication: 7/28 updated [name] by telephone [phone] or [phone] DVT prophylaxis: Lovenox Vaccination status: Vaccinated   HPI: 54 year old male with past medical history of schizophrenia, seizure disorder, biventricular heart failure(Echo 01/2020 EF 25-30%with right-sided heart failure),severe tricuspid regurgitation, nicotine dependence and COPD who presents to [facility] emergency department with suicidal ideation and seizure.Patient explains that since he left the hospital several days ago(admitted6/17-6/19-was being managed for hyponatremia, thought to be multifactorial in origin. Patient left AMA on6/19 and that time had  sodium of 132),he has been experiencing increasing fatigue. This increasing fatigue is associated with bilateral lower extremity tingling. Symptomsweresimilar to what prompted his presentation on 6/17. Additionally, he reported feeling depressed withsuicidal ideation, withouta plan. Patient reportedthat he hadattempted suicide in the distant past using pills but was unsuccessful and never sought medical attention. While the patient was waiting in the waiting room, he experienced a witnessed seizure while attempting to walk to the bathroom. Patient was immediately brought back to the emergency department for further evaluation. Patient was found to have substantial recurrent hyponatremia of 117 with concurrent hypokalemia of 2.6 and hypomagnesium of 1.4. Patient was administered 1236mg  of intravenous Fosphenytoinand initiated on low rate normal saline infusion. Patient was administered 2 g of intravenous magnesium and a 30 mEq of potassium.  Patient's electrolytes have improved, no further seizure episodes at this time.  Echo showed EF of 25-30%, cardiology recommended outpatient follow-up but otherwise continue Coreg, torsemide and Entresto.    Subjective: No acute issues or events overnight, patient complaining of left nipple pain, clear etiology, denies any trauma fall or recent scratching.  Otherwise denies nausea, vomiting, diarrhea, constipation, headache, fevers, chills.  Objective: Vitals:   04/02/20 2354 04/03/20 0411  BP: (!) 151/88 (!) 137/78  Pulse: 89 81  Resp: 18 18  Temp: 98.8 F (37.1 C) 98.5 F (36.9 C)  SpO2: 96% 99%    Intake/Output Summary (Last 24 hours) at 04/03/2020 0753 Last data filed at 04/03/2020 0400 Gross per 24 hour  Intake 1626 ml  Output 2100 ml  Net -474 ml   Filed Weights   03/27/20 0444 03/30/20 0500 04/03/20 0500  Weight: (!) 93.2 kg (!) 92.1 kg 04/05/20)  93.2 kg    Exam:  Constitutional: NAD, calm, comfortable HEENT: Extremely poor  dentition throughout mouth with multiple broken teeth in front aspect of upper gums.   Respiratory: clear to auscultation bilaterally, Normal respiratory effort.  Room air Cardiovascular: Regular rate and rhythm, no murmurs / rubs / gallops. No lower extremity edema.  No JVD.   Abdomen: no tenderness, no masses palpated.  Bowel sounds positive.  Tolerating diet without difficulty Neurologic: CN 2-12 grossly intact. Sensation intact,Strength 5/5 x all 4 extremities. Ambulates independently Psychiatric: Alert and oriented x 3 although does not have appropriate insight or capacity to manage care independently secondary to longstanding schizophrenia.  Pleasant mood.    Assessment/Plan:  Hypovolemic hyponatremia with associated seizures, POA: Intravascular dehydration, resolved Held home diuretic diuretics- torsemide, metolazone discontinued.   Na+ 133 on 7/21-repeat again on 7/22 was 132- 7/31: 131 - quite stable follow only PRN now given improved PO intake  Currently appears euvolemic  Lip edema/dental caries with abscess Resolving, did not meet sepsis criteria Patient developed low-grade fever and evolving upper lip swelling along with pain to upper gum, lips and frontal sinuses Orthopantogram revealed a lucency around the roots of right side posterior molar suggestive of possible infection 7/30 IV Unasyn D# 4/7 -will transition to Augmentin - stop date 04/05/20 Avoid NSAIDs in setting of renal insufficiency - pain currently well controlled  Chronic congestive heart failure with reduced ejection fraction, 25%.-Associated right heart failure Without acute exacerbation - well controlled on current diuretics - not currently on fluid restriction Echo in May 2021-EF 25-30% Continue Coreg, Entresto.   Daily weights with strict I's/O Digoxin level subtherapeutic at <0.02 therefore dose increased to 0.125 mg daily-repeat level 7/25 also subtherapeutic but slightly higher than previous.  Given  utilization for heart failure only patient otherwise stable from a cardiac standpoint no indication to continue to check levels Lengthy discussion daily about salt restriction - if he cannot remain complaint will change diet to low salt to help with volume management  Depression, Suicidal ideations, POA: Resolved, previously evaluated and cleared by psychiatry.  Recommended to continue BuSpar, Neurontin, Invega.   Follow-up outpatient psychiatry.   Received his routine Invega shot 7/8, takes this monthly-next dose due 8/8  Seizure disorder: Stable Secondary to hyponatremia and issues of nonadherence with medications prior to admission.   Dilantin level subtherapeutic on 7/18 at 2.6 - Dilantin dosage increased to 200 mg twice daily-repeat level on 7/25 also subtherapeutic to therefore dosage increased at recommendation of pharmacy to 200 in the morning and 300 at bedtime Repeat Dilantin level 7/30 has increased but still remains low at 6.4-discussed with pharmacist and have increased a.m. dose from 200 to 300 mg  Microcytic anemia Iron studies relatively normal, hemoglobin stable  OSA Continue nocturnal CPAP-the above regarding patient noncompliant  COPD: stable, no acute exacerbation. As needed bronchodilators  Hyperlipidemia: Daily Lipitor 40 mg  Hypokalemia/hypomagnesemia Replace as needed  Prolonged QTC:  Appears to have resolved.   Continue Seroquel 200 mg daily.  Neuropathy Currently on gabapentin 300 mg 3 TID   On 7/14 added a bedtime dose of 300 mg with improvement in discomfort  Modest obesity Estimated body mass index is 31.23 kg/m as calculated from the following:   Height as of this encounter: 5\' 8"  (1.727 m).   Weight as of this encounter: 93.2 kg.   Data Reviewed: Basic Metabolic Panel: Recent Labs  Lab 03/28/20 0631  NA 131*  K 4.1  CL 97*  CO2 26  GLUCOSE 120*  BUN 15  CREATININE 0.79  CALCIUM 9.1   Liver Function Tests: No results  for input(s): AST, ALT, ALKPHOS, BILITOT, PROT, ALBUMIN in the last 168 hours. No results for input(s): LIPASE, AMYLASE in the last 168 hours. No results for input(s): AMMONIA in the last 168 hours. CBC: Recent Labs  Lab 03/28/20 0631  HGB 10.5*  HCT 33.3*   Cardiac Enzymes: No results for input(s): CKTOTAL, CKMB, CKMBINDEX, TROPONINI in the last 168 hours. BNP (last 3 results) Recent Labs    01/22/20 0410 02/24/20 0030  BNP 697.9* 184.7*    ProBNP (last 3 results) No results for input(s): PROBNP in the last 8760 hours.  CBG: No results for input(s): GLUCAP in the last 168 hours.  No results found for this or any previous visit (from the past 240 hour(s)).   Studies: No results found.  Scheduled Meds: . amoxicillin-clavulanate  1 tablet Oral Q12H  . atorvastatin  40 mg Oral Daily  . busPIRone  10 mg Oral TID  . carvedilol  3.125 mg Oral BID WC  . digoxin  0.125 mg Oral Daily  . enoxaparin (LOVENOX) injection  40 mg Subcutaneous Daily  . fluticasone furoate-vilanterol  1 puff Inhalation Daily  . furosemide  40 mg Oral Daily  . gabapentin  300 mg Oral QID  . phenytoin  300 mg Oral BID  . QUEtiapine  200 mg Oral Daily  . sacubitril-valsartan  1 tablet Oral BID   Continuous Infusions:   Principal Problem:   Hyponatremia Active Problems:   Chronic systolic CHF (congestive heart failure) (HCC)   Mixed hyperlipidemia   COPD (chronic obstructive pulmonary disease) (HCC)   Seizure disorder (HCC)   Paranoid schizophrenia (HCC)   Hypokalemia   Hypomagnesemia   Suicidal ideation   Noncompliance with medications   Subtherapeutic serum dilantin level   Seizure (HCC)   Consultants:  None  Procedures:  None  Antibiotics: Anti-infectives (From admission, onward)   Start     Dose/Rate Route Frequency Ordered Stop   04/02/20 2200  amoxicillin-clavulanate (AUGMENTIN) 875-125 MG per tablet 1 tablet     Discontinue     1 tablet Oral Every 12 hours 04/02/20 1405  04/05/20 0959   03/29/20 0930  Ampicillin-Sulbactam (UNASYN) 3 g in sodium chloride 0.9 % 100 mL IVPB  Status:  Discontinued        3 g 200 mL/hr over 30 Minutes Intravenous Every 6 hours 03/29/20 0915 04/02/20 1405       Time spent: 25 minutes   Azucena Fallen DO  04/03/2020, 7:53 AM  LOS: 40 days

## 2020-04-04 NOTE — Progress Notes (Signed)
[facility] PROGRESS NOTE  Jim Dunn IBB:[id number] DOB: [birth year] DOA: 02/23/2020 PCP: System, Pcp Not In  Status: Inpatient---Remains inpatient appropriate because:Altered mental status and Unsafe d/c plan   Dispo: The patient is from: Home              Anticipated d/c is to: Group home              Anticipated d/c date is: > 3 days              Patient currently is medically stable to d/c.  Currently awaiting placement, TOC team working on this.  Mother is currently working to obtain Medicaid/guardianship.  As of 7/21 she has submitted all required paperwork to [facility] for [facility] in another county but needs clarification regarding which [place] he should actually apply for. Unsafe for discharge based on underlying schizophrenia and history of recurrent noncompliance was which led to patient's current hospitalization  Discussed with LCSW/case management.  Given patient's current medical diagnosis he is not eligible for Medicaid but would be eligible for home which is patient's discharge preference.  Given this would need disability funds to provide payment we are looking at group homes outside the [facility] area preferably in [facility] where patient previously resided.  Code Status: Full Family Communication: 7/28 updated [name] by telephone [phone] or [phone] DVT prophylaxis: Lovenox Vaccination status: Vaccinated   HPI: 54 year old male with past medical history of schizophrenia, seizure disorder, biventricular heart failure(Echo 01/2020 EF 25-30%with right-sided heart failure),severe tricuspid regurgitation, nicotine dependence and COPD who presents to [facility] emergency department with suicidal ideation and seizure.Patient explains that since he left the hospital several days ago(admitted6/17-6/19-was being managed for hyponatremia, thought to be multifactorial in origin. Patient left AMA on6/19 and that time had  sodium of 132),he has been experiencing increasing fatigue. This increasing fatigue is associated with bilateral lower extremity tingling. Symptomsweresimilar to what prompted his presentation on 6/17. Additionally, he reported feeling depressed withsuicidal ideation, withouta plan. Patient reportedthat he hadattempted suicide in the distant past using pills but was unsuccessful and never sought medical attention. While the patient was waiting in the waiting room, he experienced a witnessed seizure while attempting to walk to the bathroom. Patient was immediately brought back to the emergency department for further evaluation. Patient was found to have substantial recurrent hyponatremia of 117 with concurrent hypokalemia of 2.6 and hypomagnesium of 1.4. Patient was administered 1236mg  of intravenous Fosphenytoinand initiated on low rate normal saline infusion. Patient was administered 2 g of intravenous magnesium and a 30 mEq of potassium.  Patient's electrolytes have improved, no further seizure episodes at this time.  Echo showed EF of 25-30%, cardiology recommended outpatient follow-up but otherwise continue Coreg, torsemide and Entresto.    Subjective: No acute issues or events overnight, left nipple pain appears to have resolved, still without clear etiology, denies any trauma fall or recent scratching.  Otherwise denies nausea, vomiting, diarrhea, constipation, headache, fevers, chills.  Objective: Vitals:   04/03/20 2355 04/04/20 0300  BP: 110/69 117/72  Pulse: 86 78  Resp: 19 17  Temp: 98 F (36.7 C) 98.4 F (36.9 C)  SpO2: 99% 100%    Intake/Output Summary (Last 24 hours) at 04/04/2020 0743 Last data filed at 04/03/2020 0837 Gross per 24 hour  Intake 720 ml  Output --  Net 720 ml   Filed Weights   03/27/20 0444 03/30/20 0500 04/03/20 0500  Weight: (!) 93.2 kg (!) 92.1 kg 04/05/20)  93.2 kg    Exam:  Constitutional: NAD, calm, comfortable HEENT: Extremely poor dentition  throughout mouth with multiple broken teeth in front aspect of upper gums.   Respiratory: clear to auscultation bilaterally, Normal respiratory effort.  Room air Cardiovascular: Regular rate and rhythm, no murmurs / rubs / gallops. No lower extremity edema.  No JVD.   Abdomen: no tenderness, no masses palpated.  Bowel sounds positive.  Tolerating diet without difficulty Neurologic: CN 2-12 grossly intact. Sensation intact,Strength 5/5 x all 4 extremities. Ambulates independently Psychiatric: Alert and oriented x 3 although does not have appropriate insight or capacity to manage care independently secondary to longstanding schizophrenia.  Pleasant mood.    Assessment/Plan:  Hypovolemic hyponatremia with associated seizures, POA: Intravascular dehydration, resolved Held home diuretic diuretics- torsemide, metolazone discontinued.   Na+ 133 on 7/21-repeat again on 7/22 was 132- 7/31: 131 - quite stable follow only PRN now given improved PO intake  Currently appears euvolemic  Lip edema/dental caries with abscess Resolving, did not meet sepsis criteria Patient developed low-grade fever and evolving upper lip swelling along with pain to upper gum, lips and frontal sinuses Orthopantogram revealed a lucency around the roots of right side posterior molar suggestive of possible infection 7/30 IV Unasyn D# 4/7 -will transition to Augmentin - stop date 04/05/20 Avoid NSAIDs in setting of renal insufficiency - pain currently well controlled  Chronic congestive heart failure with reduced ejection fraction, 25%.-Associated right heart failure Without acute exacerbation - well controlled on current diuretics - not currently on fluid restriction Echo in May 2021-EF 25-30% Continue Coreg, Entresto.   Daily weights with strict I's/O Digoxin level subtherapeutic at <0.02 therefore dose increased to 0.125 mg daily-repeat level 7/25 also subtherapeutic but slightly higher than previous.  Given utilization for  heart failure only patient otherwise stable from a cardiac standpoint no indication to continue to check levels Lengthy discussion daily about salt restriction - if he cannot remain complaint will change diet to low salt to help with volume management  Depression, Suicidal ideations, POA: Resolved, previously evaluated and cleared by psychiatry.  Recommended to continue BuSpar, Neurontin, Invega.   Follow-up outpatient psychiatry.   Received his routine Invega shot 7/8, takes this monthly-next dose due 8/8  Seizure disorder: Stable Secondary to hyponatremia and issues of nonadherence with medications prior to admission.   Dilantin level subtherapeutic on 7/18 at 2.6 - Dilantin dosage increased to 200 mg twice daily-repeat level on 7/25 also subtherapeutic to therefore dosage increased at recommendation of pharmacy to 200 in the morning and 300 at bedtime Repeat Dilantin level 7/30 has increased but still remains low at 6.4-discussed with pharmacist and have increased a.m. dose from 200 to 300 mg  Microcytic anemia Iron studies relatively normal, hemoglobin stable  OSA Continue nocturnal CPAP-the above regarding patient noncompliant  COPD: stable, no acute exacerbation. As needed bronchodilators  Hyperlipidemia: Daily Lipitor 40 mg  Hypokalemia/hypomagnesemia Replace as needed  Prolonged QTC:  Appears to have resolved.   Continue Seroquel 200 mg daily.  Neuropathy Currently on gabapentin 300 mg 3 TID   On 7/14 added a bedtime dose of 300 mg with improvement in discomfort  Modest obesity Estimated body mass index is 31.23 kg/m as calculated from the following:   Height as of this encounter: 5\' 8"  (1.727 m).   Weight as of this encounter: 93.2 kg.   Data Reviewed: Basic Metabolic Panel: Recent Labs  Lab 04/03/20 0924  NA 131*  K 3.8  CL 98  CO2 23  GLUCOSE 155*  BUN 9  CREATININE 0.52*  CALCIUM 9.3   Liver Function Tests: No results for input(s): AST,  ALT, ALKPHOS, BILITOT, PROT, ALBUMIN in the last 168 hours. No results for input(s): LIPASE, AMYLASE in the last 168 hours. No results for input(s): AMMONIA in the last 168 hours. CBC: No results for input(s): WBC, NEUTROABS, HGB, HCT, MCV, PLT in the last 168 hours. Cardiac Enzymes: No results for input(s): CKTOTAL, CKMB, CKMBINDEX, TROPONINI in the last 168 hours. BNP (last 3 results) Recent Labs    01/22/20 0410 02/24/20 0030  BNP 697.9* 184.7*    ProBNP (last 3 results) No results for input(s): PROBNP in the last 8760 hours.  CBG: No results for input(s): GLUCAP in the last 168 hours.  No results found for this or any previous visit (from the past 240 hour(s)).   Studies: No results found.  Scheduled Meds: . amoxicillin-clavulanate  1 tablet Oral Q12H  . atorvastatin  40 mg Oral Daily  . busPIRone  10 mg Oral TID  . carvedilol  3.125 mg Oral BID WC  . digoxin  0.125 mg Oral Daily  . enoxaparin (LOVENOX) injection  40 mg Subcutaneous Daily  . fluticasone furoate-vilanterol  1 puff Inhalation Daily  . furosemide  40 mg Oral Daily  . gabapentin  300 mg Oral QID  . phenytoin  300 mg Oral BID  . QUEtiapine  200 mg Oral Daily  . sacubitril-valsartan  1 tablet Oral BID   Continuous Infusions:   Principal Problem:   Hyponatremia Active Problems:   Chronic systolic CHF (congestive heart failure) (HCC)   Mixed hyperlipidemia   COPD (chronic obstructive pulmonary disease) (HCC)   Seizure disorder (HCC)   Paranoid schizophrenia (HCC)   Hypokalemia   Hypomagnesemia   Suicidal ideation   Noncompliance with medications   Subtherapeutic serum dilantin level   Seizure (HCC)   Consultants:  None  Procedures:  None  Antibiotics: Anti-infectives (From admission, onward)   Start     Dose/Rate Route Frequency Ordered Stop   04/02/20 2200  amoxicillin-clavulanate (AUGMENTIN) 875-125 MG per tablet 1 tablet     Discontinue     1 tablet Oral Every 12 hours 04/02/20  1405 04/05/20 0959   03/29/20 0930  Ampicillin-Sulbactam (UNASYN) 3 g in sodium chloride 0.9 % 100 mL IVPB  Status:  Discontinued        3 g 200 mL/hr over 30 Minutes Intravenous Every 6 hours 03/29/20 0915 04/02/20 1405       Time spent: 25 minutes   Azucena Fallen DO  04/04/2020, 7:43 AM  LOS: 41 days

## 2020-04-04 NOTE — Progress Notes (Signed)
Pt placed on cpap 

## 2020-04-05 NOTE — Progress Notes (Signed)
Patient refuses CPAP for the night. At bedside if changes his mind

## 2020-04-05 NOTE — Progress Notes (Signed)
[facility] PROGRESS NOTE  Emin Foree ION:[id number] DOB: 1966/05/05 DOA: 02/23/2020 PCP: System, Pcp Not In  Status: Inpatient---Remains inpatient appropriate because:Altered mental status and Unsafe d/c plan   Dispo: The patient is from: Home              Anticipated d/c is to: Group home              Anticipated d/c date is: > 3 days              Patient currently is medically stable to d/c.  Currently awaiting placement, TOC team working on this.  Mother is currently working to obtain Medicaid/guardianship.  As of 7/21 she has submitted all required paperwork to [facility] for [facility] in another county but needs clarification regarding which [place] he should actually apply for. Unsafe for discharge based on underlying schizophrenia and history of recurrent noncompliance was which led to patient's current hospitalization  Discussed with LCSW/case management.  Given patient's current medical diagnosis he is not eligible for Medicaid but would be eligible for home which is patient's discharge preference.  Given this would need disability funds to provide payment we are looking at group homes outside the [facility] area preferably in [facility] where patient previously resided.  Code Status: Full Family Communication: 7/28 updated [name] by telephone [phone] or [phone] DVT prophylaxis: Lovenox Vaccination status: Vaccinated   HPI: 54 year old male with past medical history of schizophrenia, seizure disorder, biventricular heart failure(Echo 01/2020 EF 25-30%with right-sided heart failure),severe tricuspid regurgitation, nicotine dependence and COPD who presents to [facility] emergency department with suicidal ideation and seizure.Patient explains that since he left the hospital several days ago(admitted6/17-6/19-was being managed for hyponatremia, thought to be multifactorial in origin. Patient left AMA on6/19 and that time had  sodium of 132),he has been experiencing increasing fatigue. This increasing fatigue is associated with bilateral lower extremity tingling. Symptomsweresimilar to what prompted his presentation on 6/17. Additionally, he reported feeling depressed withsuicidal ideation, withouta plan. Patient reportedthat he hadattempted suicide in the distant past using pills but was unsuccessful and never sought medical attention. While the patient was waiting in the waiting room, he experienced a witnessed seizure while attempting to walk to the bathroom. Patient was immediately brought back to the emergency department for further evaluation. Patient was found to have substantial recurrent hyponatremia of 117 with concurrent hypokalemia of 2.6 and hypomagnesium of 1.4. Patient was administered 1236mg  of intravenous Fosphenytoinand initiated on low rate normal saline infusion. Patient was administered 2 g of intravenous magnesium and a 30 mEq of potassium.  Patient's electrolytes have improved, no further seizure episodes at this time.  Echo showed EF of 25-30%, cardiology recommended outpatient follow-up but otherwise continue Coreg, torsemide and Entresto.    Subjective: Sitting up in chair.  Denies shortness of breath, chest pain, abdominal pain or dyspnea on exertion.  Objective: Vitals:   04/05/20 0852 04/05/20 1154  BP: (!) 116/87 120/83  Pulse: 92 74  Resp: 17 18  Temp: 98.8 F (37.1 C) 97.9 F (36.6 C)  SpO2: 100% 100%    Intake/Output Summary (Last 24 hours) at 04/05/2020 1242 Last data filed at 04/05/2020 1045 Gross per 24 hour  Intake 924 ml  Output --  Net 924 ml   Filed Weights   03/27/20 0444 03/30/20 0500 04/03/20 0500  Weight: (!) 93.2 kg (!) 92.1 kg (!) 93.2 kg    Exam:  Constitutional: NAD, calm, comfortable HEENT: Extremely poor dentition throughout  mouth with multiple broken teeth in front aspect of upper gums.   Respiratory: clear to auscultation bilaterally, Normal  respiratory effort.  Room air Cardiovascular: Regular rate and rhythm, no murmurs / rubs / gallops. No lower extremity edema.  No JVD.   Abdomen: no tenderness, no masses palpated.  Bowel sounds positive.  Tolerating solid diet.  First to eat salads when can obtain. Neurologic: CN 2-12 grossly intact. Sensation intact,Strength 5/5 x all 4 extremities. Ambulates independently Psychiatric: Alert and oriented x 3 although does not have appropriate insight or capacity to manage care independently 2/2 longstanding schizophrenia.  Pleasant mood.    Assessment/Plan:  Hyponatremia with seizures: Intravascular dehydration, resolved Held home diuretic diuretics- torsemide, metolazone discontinued.   Na+ 133 on 7/21-repeat again on 7/22 was 132-this point can check prn Currently appears euvolemic We will repeat electrolyte panel 7/31 stable  Lip edema/dental caries with abscess Resolving Patient developed low-grade fever and evolving upper lip swelling along with pain to upper gum, lips and frontal sinuses Orthopantogram revealed a lucency around the roots of right side posterior molar suggestive of possible infection 7/30 IV Unasyn D# 4/7 been transitioned to Augmentin with last dose due today on 8/2 Continue Tylenol as needed for any discomfort  Chronic congestive heart failure with reduced ejection fraction, 25%.-Associated right heart failure Euvolemic for several days but developed trace edema for was initiated on low-dose Lasix-sodium decreased slightly but otherwise has remained stable.   Continue low-dose Lasix  Echo in May 2021-EF 25-30% Continue Coreg, Entresto.   Daily weights with strict I's/O On heart healthy diet without fluid restriction Digoxin level subtherapeutic at <0.02 therefore dose increased to 0.125 mg daily-repeat level 7/25 also subtherapeutic.  Given utilization for heart failure only patient otherwise stable from a cardiac standpoint no indication to continue to  check levels Counseled extensively by multiple providers regarding need to adhere to salt restricted diet  Depression, Suicidal ideations: Resolved, seen by psychiatry.  Recommended to continue BuSpar, Neurontin, Invega.   Follow-up outpatient psychiatry.   Received his routine Invega shot 7/8, takes this monthly-next dose due 8/8  Seizure disorder: Stable Secondary to hyponatremia and issues of nonadherence with medications prior to admission.   Dilantin level subtherapeutic on 7/18 at 2.6 - Dilantin dosage increased to 200 mg twice daily-repeat level on 7/25 also subtherapeutic to therefore dosage increased at recommendation of pharmacy to 200 in the morning and 300 at bedtime Repeat Dilantin level 7/30 has increased but still remains low at 6.4-discussed with pharmacist and have increased a.m. dose from 200 to 300 mg  Microcytic anemia Iron studies relatively normal, hemoglobin stable  OSA Continue nocturnal CPAP-the above regarding patient noncompliant  COPD: stable. As needed bronchodilators  Hyperlipidemia: Daily Lipitor 40 mg  Hypokalemia/hypomagnesemia Replace as needed  Prolonged QTC:  Appears to have resolved.   Can you Seroquel 200 mg daily.  Neuropathy Currently on gabapentin 300 mg 3 TID   On 7/14 added a bedtime dose of 300 mg with improvement in discomfort  Modest obesity Estimated body mass index is 31.23 kg/m as calculated from the following:   Height as of this encounter: 5\' 8"  (1.727 m).   Weight as of this encounter: 93.2 kg.      Data Reviewed: Basic Metabolic Panel: Recent Labs  Lab 04/03/20 0924  NA 131*  K 3.8  CL 98  CO2 23  GLUCOSE 155*  BUN 9  CREATININE 0.52*  CALCIUM 9.3   Liver Function Tests: No results for input(s): AST,  ALT, ALKPHOS, BILITOT, PROT, ALBUMIN in the last 168 hours. No results for input(s): LIPASE, AMYLASE in the last 168 hours. No results for input(s): AMMONIA in the last 168 hours. CBC: No  results for input(s): WBC, NEUTROABS, HGB, HCT, MCV, PLT in the last 168 hours. Cardiac Enzymes: No results for input(s): CKTOTAL, CKMB, CKMBINDEX, TROPONINI in the last 168 hours. BNP (last 3 results) Recent Labs    01/22/20 0410 02/24/20 0030  BNP 697.9* 184.7*    ProBNP (last 3 results) No results for input(s): PROBNP in the last 8760 hours.  CBG: No results for input(s): GLUCAP in the last 168 hours.  No results found for this or any previous visit (from the past 240 hour(s)).   Studies: No results found.  Scheduled Meds: . atorvastatin  40 mg Oral Daily  . busPIRone  10 mg Oral TID  . carvedilol  3.125 mg Oral BID WC  . digoxin  0.125 mg Oral Daily  . enoxaparin (LOVENOX) injection  40 mg Subcutaneous Daily  . fluticasone furoate-vilanterol  1 puff Inhalation Daily  . furosemide  40 mg Oral Daily  . gabapentin  300 mg Oral QID  . phenytoin  300 mg Oral BID  . QUEtiapine  200 mg Oral Daily  . sacubitril-valsartan  1 tablet Oral BID   Continuous Infusions:   Principal Problem:   Hyponatremia Active Problems:   Chronic systolic CHF (congestive heart failure) (HCC)   Mixed hyperlipidemia   COPD (chronic obstructive pulmonary disease) (HCC)   Seizure disorder (HCC)   Paranoid schizophrenia (HCC)   Hypokalemia   Hypomagnesemia   Suicidal ideation   Noncompliance with medications   Subtherapeutic serum dilantin level   Seizure (HCC)        Consultants:  None  Procedures:  None  Antibiotics: Anti-infectives (From admission, onward)   Start     Dose/Rate Route Frequency Ordered Stop   04/02/20 2200  amoxicillin-clavulanate (AUGMENTIN) 875-125 MG per tablet 1 tablet        1 tablet Oral Every 12 hours 04/02/20 1405 04/04/20 2130   03/29/20 0930  Ampicillin-Sulbactam (UNASYN) 3 g in sodium chloride 0.9 % 100 mL IVPB  Status:  Discontinued        3 g 200 mL/hr over 30 Minutes Intravenous Every 6 hours 03/29/20 0915 04/02/20 1405       Time  spent: 10 minutes    Junious Silk ANP  [facility] Pager [phone]. If 7PM-7AM, please contact night-coverage at [email or web address] 04/05/2020, 12:42 PM  LOS: 42 days

## 2020-04-06 NOTE — Progress Notes (Signed)
[facility] PROGRESS NOTE  Jim Dunn:[id number] DOB: 11-09-1965 DOA: 02/23/2020 PCP: System, Pcp Not In  Status: Inpatient---Remains inpatient appropriate because:Altered mental status and Unsafe d/c plan   Dispo: The patient is from: Home              Anticipated d/c is to: Group home              Anticipated d/c date is: > 3 days              Patient currently is medically stable to d/c.  Currently awaiting placement, TOC team working on this.  Mother is currently working to obtain Medicaid/guardianship.  As of 7/21 she has submitted all required paperwork to [facility] for [facility] in another county but needs clarification regarding which [place] he should actually apply for. Unsafe for discharge based on underlying schizophrenia and history of recurrent noncompliance was which led to patient's current hospitalization  Discussed with LCSW/case management.  Given patient's current medical diagnosis he is not eligible for Medicaid but would be eligible for home which is patient's discharge preference.  Given this would need disability funds to provide payment we are looking at group homes outside the [facility] area preferably in [facility] where patient previously resided.  Code Status: Full Family Communication: 7/28 updated [name] by telephone [phone] or [phone] DVT prophylaxis: Lovenox Vaccination status: Vaccinated   HPI: 54 year old male with past medical history of schizophrenia, seizure disorder, biventricular heart failure(Echo 01/2020 EF 25-30%with right-sided heart failure),severe tricuspid regurgitation, nicotine dependence and COPD who presents to [facility] emergency department with suicidal ideation and seizure.Patient explains that since he left the hospital several days ago(admitted6/17-6/19-was being managed for hyponatremia, thought to be multifactorial in origin. Patient left AMA on6/19 and that time had  sodium of 132),he has been experiencing increasing fatigue. This increasing fatigue is associated with bilateral lower extremity tingling. Symptomsweresimilar to what prompted his presentation on 6/17. Additionally, he reported feeling depressed withsuicidal ideation, withouta plan. Patient reportedthat he hadattempted suicide in the distant past using pills but was unsuccessful and never sought medical attention. While the patient was waiting in the waiting room, he experienced a witnessed seizure while attempting to walk to the bathroom. Patient was immediately brought back to the emergency department for further evaluation. Patient was found to have substantial recurrent hyponatremia of 117 with concurrent hypokalemia of 2.6 and hypomagnesium of 1.4. Patient was administered 1236mg  of intravenous Fosphenytoinand initiated on low rate normal saline infusion. Patient was administered 2 g of intravenous magnesium and a 30 mEq of potassium.  Patient's electrolytes have improved, no further seizure episodes at this time.  Echo showed EF of 25-30%, cardiology recommended outpatient follow-up but otherwise continue Coreg, torsemide and Entresto.    Subjective: No shortness of breath, chest pain or abdominal discomfort verbalized. Discussed with patient why he continues to refuse CPAP.  He states "I cannot breathe" during utilization of the mask. I informed the patient that I would ask RT to try different masks  Objective: Vitals:   04/06/20 0810 04/06/20 1200  BP: (!) 153/85 (!) 153/85  Pulse: 75 69  Resp:  17  Temp:  (!) 97.5 F (36.4 C)  SpO2:  100%    Intake/Output Summary (Last 24 hours) at 04/06/2020 1247 Last data filed at 04/05/2020 2100 Gross per 24 hour  Intake 720 ml  Output --  Net 720 ml   Filed Weights   03/30/20 0500 04/03/20 0500 04/06/20 06/06/20  Weight: (!) 92.1 kg (!) 93.2 kg 94.3 kg    Exam:  Constitutional: NAD, calm, comfortable HEENT: Extremely poor  dentition throughout mouth with multiple broken teeth in front aspect of upper gums.   Respiratory: clear to auscultation bilaterally, Normal respiratory effort.  Room air Cardiovascular: Regular rate and rhythm, no murmurs / rubs / gallops. No lower extremity edema.  No JVD.   Abdomen: no tenderness, no masses palpated.  Bowel sounds positive.  Tolerating solid diet.  First to eat salads when can obtain. Neurologic: CN 2-12 grossly intact. Sensation intact,Strength 5/5 x all 4 extremities. Ambulates independently Psychiatric: Alert and oriented x 3 although does not have appropriate insight or capacity to manage care independently 2/2 longstanding schizophrenia.  Pleasant mood.    Assessment/Plan:  Hyponatremia with seizures: Intravascular dehydration, resolved Held home diuretic diuretics- torsemide, metolazone discontinued.   Na+ 133 on 7/21-repeat again on 7/22 was 132-this point can check prn Currently appears euvolemic We will repeat electrolyte panel 7/31 stable  Lip edema/dental caries with abscess Resolved Patient developed low-grade fever and evolving upper lip swelling along with pain to upper gum, lips and frontal sinuses Orthopantogram revealed a lucency around the roots of right side posterior molar suggestive of possible infection Has completed IV Unasyn and Augmentin as of 8/2 Continue Tylenol as needed for any discomfort  Chronic congestive heart failure with reduced ejection fraction, 25%.-Associated right heart failure Euvolemic for several days but developed trace edema for was initiated on low-dose Lasix-sodium decreased slightly but otherwise has remained stable.   Continue low-dose Lasix  Echo in May 2021-EF 25-30% Continue Coreg, Entresto.   Daily weights with strict I's/O On heart healthy diet without fluid restriction Digoxin level subtherapeutic at <0.02 therefore dose increased to 0.125 mg daily-repeat level 7/25 also subtherapeutic but no indication to  continue to follow given utilization for heart failure physiology only Counseled extensively by multiple providers regarding need to adhere to salt restricted diet  Depression, Suicidal ideations: Resolved, seen by psychiatry.  Recommended to continue BuSpar, Neurontin, Invega.   Follow-up outpatient psychiatry.   Received his routine Invega shot 7/8, takes this monthly-next dose due 8/8  Seizure disorder: Stable Secondary to hyponatremia and issues of nonadherence with medications prior to admission.   Dilantin level subtherapeutic on 7/18 at 2.6 - Dilantin dosage increased to 200 mg twice daily-repeat level on 7/25 also subtherapeutic to therefore dosage increased at recommendation of pharmacy to 200 in the morning and 300 at bedtime Repeat Dilantin level 7/30 has increased but still remains low at 6.4-discussed with pharmacist and have increased a.m. dose from 200 to 300 mg  Microcytic anemia Iron studies relatively normal, hemoglobin stable  OSA Continue nocturnal CPAP-the above regarding patient noncompliant  COPD: stable. As needed bronchodilators  Hyperlipidemia: Daily Lipitor 40 mg  Hypokalemia/hypomagnesemia Replace as needed  Prolonged QTC:  Appears to have resolved.   Can you Seroquel 200 mg daily.  Neuropathy Currently on gabapentin 300 mg 3 TID   On 7/14 added a bedtime dose of 300 mg with improvement in discomfort  Modest obesity Estimated body mass index is 31.61 kg/m as calculated from the following:   Height as of this encounter: 5\' 8"  (1.727 m).   Weight as of this encounter: 94.3 kg.      Data Reviewed: Basic Metabolic Panel: Recent Labs  Lab 04/03/20 0924  NA 131*  K 3.8  CL 98  CO2 23  GLUCOSE 155*  BUN 9  CREATININE 0.52*  CALCIUM 9.3  Liver Function Tests: No results for input(s): AST, ALT, ALKPHOS, BILITOT, PROT, ALBUMIN in the last 168 hours. No results for input(s): LIPASE, AMYLASE in the last 168 hours. No results  for input(s): AMMONIA in the last 168 hours. CBC: No results for input(s): WBC, NEUTROABS, HGB, HCT, MCV, PLT in the last 168 hours. Cardiac Enzymes: No results for input(s): CKTOTAL, CKMB, CKMBINDEX, TROPONINI in the last 168 hours. BNP (last 3 results) Recent Labs    01/22/20 0410 02/24/20 0030  BNP 697.9* 184.7*    ProBNP (last 3 results) No results for input(s): PROBNP in the last 8760 hours.  CBG: No results for input(s): GLUCAP in the last 168 hours.  No results found for this or any previous visit (from the past 240 hour(s)).   Studies: No results found.  Scheduled Meds: . atorvastatin  40 mg Oral Daily  . busPIRone  10 mg Oral TID  . carvedilol  3.125 mg Oral BID WC  . digoxin  0.125 mg Oral Daily  . enoxaparin (LOVENOX) injection  40 mg Subcutaneous Daily  . fluticasone furoate-vilanterol  1 puff Inhalation Daily  . furosemide  40 mg Oral Daily  . gabapentin  300 mg Oral QID  . phenytoin  300 mg Oral BID  . QUEtiapine  200 mg Oral Daily  . sacubitril-valsartan  1 tablet Oral BID   Continuous Infusions:   Principal Problem:   Hyponatremia Active Problems:   Chronic systolic CHF (congestive heart failure) (HCC)   Mixed hyperlipidemia   COPD (chronic obstructive pulmonary disease) (HCC)   Seizure disorder (HCC)   Paranoid schizophrenia (HCC)   Hypokalemia   Hypomagnesemia   Suicidal ideation   Noncompliance with medications   Subtherapeutic serum dilantin level   Seizure (HCC)        Consultants:  None  Procedures:  None  Antibiotics: Anti-infectives (From admission, onward)   Start     Dose/Rate Route Frequency Ordered Stop   04/02/20 2200  amoxicillin-clavulanate (AUGMENTIN) 875-125 MG per tablet 1 tablet        1 tablet Oral Every 12 hours 04/02/20 1405 04/04/20 2130   03/29/20 0930  Ampicillin-Sulbactam (UNASYN) 3 g in sodium chloride 0.9 % 100 mL IVPB  Status:  Discontinued        3 g 200 mL/hr over 30 Minutes Intravenous Every 6  hours 03/29/20 0915 04/02/20 1405       Time spent: 10 minutes    Junious Silk ANP  [facility] Pager [phone]. If 7PM-7AM, please contact night-coverage at [email or web address] 04/06/2020, 12:47 PM  LOS: 43 days

## 2020-04-07 LAB — ALBUMIN: Albumin: 3.5 g/dL (ref 3.5–5.0)

## 2020-04-07 LAB — PHENYTOIN LEVEL, TOTAL: Phenytoin Lvl: 8.7 ug/mL — ABNORMAL LOW (ref 10.0–20.0)

## 2020-04-07 NOTE — Progress Notes (Addendum)
[facility] PROGRESS NOTE  Jim Dunn LYY:[id number] DOB: [birth year] DOA: 02/23/2020 PCP: System, Pcp Not In  Status: Inpatient---Remains inpatient appropriate because:Altered mental status and Unsafe d/c plan   Dispo: The patient is from: Home              Anticipated d/c is to: Group home              Anticipated d/c date is: > 3 days              Patient currently is not medically stable to d/c. **NEEDS SAFE DC PLAN** Currently awaiting placement, TOC team working on this.  Mother is currently working to obtain Medicaid/guardianship.  As of 7/21 she has submitted all required paperwork to [facility] for [facility] in another county but needs clarification regarding which [place] he should actually apply for. Unsafe for discharge based on underlying schizophrenia and history of recurrent noncompliance was which led to patient's current hospitalization  Discussed with LCSW/case management.  Given patient's current medical diagnosis he is not eligible for Medicaid but would be eligible for home which is patient's discharge preference.  Given this would need disability funds to provide payment we are looking at group homes outside the [facility] area preferably in [facility] [place] where patient previously resided.  Code Status: Full Family Communication: 7/28 updated [name] by telephone [phone] or [phone] DVT prophylaxis: Lovenox Vaccination status: Vaccinated   HPI: 54 year old male with past medical history of schizophrenia, seizure disorder, biventricular heart failure(Echo 01/2020 EF 25-30%with right-sided heart failure),severe tricuspid regurgitation, nicotine dependence and COPD who presents to [facility] emergency department with suicidal ideation and seizure.Patient explains that since he left the hospital several days ago(admitted6/17-6/19-was being managed for hyponatremia, thought to be multifactorial in origin. Patient left AMA  on6/19 and that time had sodium of 132),he has been experiencing increasing fatigue. This increasing fatigue is associated with bilateral lower extremity tingling. Symptomsweresimilar to what prompted his presentation on 6/17. Additionally, he reported feeling depressed withsuicidal ideation, withouta plan. Patient reportedthat he hadattempted suicide in the distant past using pills but was unsuccessful and never sought medical attention. While the patient was waiting in the waiting room, he experienced a witnessed seizure while attempting to walk to the bathroom. Patient was immediately brought back to the emergency department for further evaluation. Patient was found to have substantial recurrent hyponatremia of 117 with concurrent hypokalemia of 2.6 and hypomagnesium of 1.4. Patient was administered 1236mg  of intravenous Fosphenytoinand initiated on low rate normal saline infusion. Patient was administered 2 g of intravenous magnesium and a 30 mEq of potassium.  Patient's electrolytes have improved, no further seizure episodes at this time.  Echo showed EF of 25-30%, cardiology recommended outpatient follow-up but otherwise continue Coreg, torsemide and Entresto.    Subjective: No complaints. Utilized a different CPAP mask overnight which he tolerated better.  Objective: Vitals:   04/07/20 0350 04/07/20 0749  BP: 100/68 111/69  Pulse: 83 78  Resp: 18 16  Temp: 98.3 F (36.8 C) 98 F (36.7 C)  SpO2: 99% 96%    Intake/Output Summary (Last 24 hours) at 04/07/2020 1210 Last data filed at 04/06/2020 1837 Gross per 24 hour  Intake 236 ml  Output --  Net 236 ml   Filed Weights   04/03/20 0500 04/06/20 0558 04/07/20 0602  Weight: (!) 93.2 kg 94.3 kg 94.2 kg    Exam:  Constitutional: NAD, calm, comfortable HEENT: Extremely poor dentition throughout mouth with multiple broken  teeth in front aspect of upper gums.   Respiratory: clear to auscultation bilaterally, Normal  respiratory effort at rest.  Room air Cardiovascular: Regular rate and rhythm, no murmurs / rubs / gallops. No lower extremity edema.  No JVD.  Peripheral pulses easily palpable. Abdomen: no tenderness, no masses palpated.  Bowel sounds positive.  Tolerating solid diet.   Neurologic: CN 2-12 grossly intact. Sensation intact,Strength 5/5 x all 4 extremities. Ambulates independently Psychiatric: Alert and oriented x 3 although does not have appropriate insight or capacity to manage care independently 2/2 longstanding schizophrenia.  Pleasant mood.    Assessment/Plan:  Hyponatremia with seizures: Intravascular dehydration, resolved Held home diuretic diuretics- torsemide, metolazone discontinued.   Na+ 133 on 7/21-repeat again on 7/22 was 132-this point can check prn Currently appears euvolemic 7/31 Na stable 131  Lip edema/dental caries with abscess Resolved Patient developed low-grade fever and evolving upper lip swelling along with pain to upper gum, lips and frontal sinuses Orthopantogram revealed a lucency around the roots of right side posterior molar suggestive of possible infection Has completed IV Unasyn and Augmentin as of 8/2 Continue Tylenol as needed for any discomfort  Chronic congestive heart failure with reduced ejection fraction, 25%.-Associated right heart failure Euvolemic for several days but developed trace edema for was initiated on low-dose Lasix-sodium decreased slightly but otherwise has remained stable.   Continue low-dose Lasix  Echo in May 2021-EF 25-30% Continue Coreg, Entresto.   Daily weights with strict I's/O On heart healthy diet without fluid restriction Digoxin level subtherapeutic at <0.02 therefore dose increased to 0.125 mg daily-repeat level 7/25 also subtherapeutic but no indication to continue to follow given utilization for heart failure physiology only Counseled extensively by multiple providers regarding need to adhere to salt restricted  diet  Depression, Suicidal ideations: Resolved, seen by psychiatry.  Recommended to continue BuSpar, Neurontin, Invega.   Follow-up outpatient psychiatry.   Received his routine Invega shot 7/8, takes this monthly-next dose due 8/8  Seizure disorder: Stable Secondary to hyponatremia and issues of nonadherence with medications prior to admission.   Dilantin level subtherapeutic on 7/18 at 2.6 - Dilantin dosage increased to 200 mg twice daily-repeat level on 7/25 also subtherapeutic to therefore dosage increased at recommendation of pharmacy to 200 in the morning and 300 at bedtime Repeat Dilantin level 7/30 has increased but still remains low at 6.4-discussed with pharmacist and have increased a.m. dose from 200 to 300 mg; repeat Dilantin level 8.7 on 8/4-repeat level in 1 week.  Microcytic anemia Iron studies relatively normal, hemoglobin stable  OSA Continue nocturnal CPAP-the above regarding patient noncompliant  COPD: stable. As needed bronchodilators  Hyperlipidemia: Daily Lipitor 40 mg  Hypokalemia/hypomagnesemia Replace as needed  Prolonged QTC:  Appears to have resolved.   Can you Seroquel 200 mg daily.  Neuropathy Currently on gabapentin 300 mg 3 TID   On 7/14 added a bedtime dose of 300 mg with improvement in discomfort  Modest obesity Estimated body mass index is 31.57 kg/m as calculated from the following:   Height as of this encounter: 5\' 8"  (1.727 m).   Weight as of this encounter: 94.2 kg.      Data Reviewed: Basic Metabolic Panel: Recent Labs  Lab 04/03/20 0924  NA 131*  K 3.8  CL 98  CO2 23  GLUCOSE 155*  BUN 9  CREATININE 0.52*  CALCIUM 9.3   Liver Function Tests: Recent Labs  Lab 04/07/20 0330  ALBUMIN 3.5   No results for input(s): LIPASE, AMYLASE  in the last 168 hours. No results for input(s): AMMONIA in the last 168 hours. CBC: No results for input(s): WBC, NEUTROABS, HGB, HCT, MCV, PLT in the last 168 hours. Cardiac  Enzymes: No results for input(s): CKTOTAL, CKMB, CKMBINDEX, TROPONINI in the last 168 hours. BNP (last 3 results) Recent Labs    01/22/20 0410 02/24/20 0030  BNP 697.9* 184.7*    ProBNP (last 3 results) No results for input(s): PROBNP in the last 8760 hours.  CBG: No results for input(s): GLUCAP in the last 168 hours.  No results found for this or any previous visit (from the past 240 hour(s)).   Studies: No results found.  Scheduled Meds: . atorvastatin  40 mg Oral Daily  . busPIRone  10 mg Oral TID  . carvedilol  3.125 mg Oral BID WC  . digoxin  0.125 mg Oral Daily  . enoxaparin (LOVENOX) injection  40 mg Subcutaneous Daily  . fluticasone furoate-vilanterol  1 puff Inhalation Daily  . furosemide  40 mg Oral Daily  . gabapentin  300 mg Oral QID  . phenytoin  300 mg Oral BID  . QUEtiapine  200 mg Oral Daily  . sacubitril-valsartan  1 tablet Oral BID   Continuous Infusions:   Principal Problem:   Hyponatremia Active Problems:   Chronic systolic CHF (congestive heart failure) (HCC)   Mixed hyperlipidemia   COPD (chronic obstructive pulmonary disease) (HCC)   Seizure disorder (HCC)   Paranoid schizophrenia (HCC)   Hypokalemia   Hypomagnesemia   Suicidal ideation   Noncompliance with medications   Subtherapeutic serum dilantin level   Seizure (HCC)        Consultants:  None  Procedures:  None  Antibiotics: Anti-infectives (From admission, onward)   Start     Dose/Rate Route Frequency Ordered Stop   04/02/20 2200  amoxicillin-clavulanate (AUGMENTIN) 875-125 MG per tablet 1 tablet        1 tablet Oral Every 12 hours 04/02/20 1405 04/04/20 2130   03/29/20 0930  Ampicillin-Sulbactam (UNASYN) 3 g in sodium chloride 0.9 % 100 mL IVPB  Status:  Discontinued        3 g 200 mL/hr over 30 Minutes Intravenous Every 6 hours 03/29/20 0915 04/02/20 1405       Time spent: 10 minutes    Junious Silk ANP  [facility] Pager [phone]. If 7PM-7AM,  please contact night-coverage at [email or web address] 04/07/2020, 12:10 PM  LOS: 44 days

## 2020-04-08 DIAGNOSIS — E871 Hypo-osmolality and hyponatremia: Secondary | ICD-10-CM | POA: Diagnosis not present

## 2020-04-08 MED ORDER — CLONAZEPAM 0.5 MG PO TABS
0.5000 mg | ORAL_TABLET | Freq: Two times a day (BID) | ORAL | Status: AC | PRN
  Administered 2020-04-08 – 2020-04-11 (×7): 0.5 mg via ORAL
  Filled 2020-04-08 (×7): qty 1

## 2020-04-08 NOTE — TOC Progression Note (Signed)
Transition of Care [facility]) - Progression Note    Patient Details  Name: Dontrelle Mazon MRN: [id number] Date of Birth: 04-May-1966  [facility] CM/SW Contact  Kermit Balo, RN Phone Number: 04/08/2020, 8:33 AM  Clinical Narrative:    CM has reached out to over 10 Group homes in the [facility] area. Some numbers aren't working, some don't have space, CM has left voicemail with several others. CM did speak to someone at [facility] home and was suppose to get a call back but no return call. TOC will continue to work on placement.    Expected Discharge Plan: Assisted Living Barriers to Discharge: Continued Medical Work up, Unsafe home situation  Expected Discharge Plan and Services Expected Discharge Plan: Assisted Living       Living arrangements for the past 2 months: Single Family Home                                       Social Determinants of Health (SDOH) Interventions    Readmission Risk Interventions Readmission Risk Prevention Plan 01/23/2020  Transportation Screening Complete  HRI or Home Care Consult Complete  Social Work Consult for Recovery Care Planning/Counseling Complete  Palliative Care Screening Not Applicable  Medication Review Oceanographer) (No Data)

## 2020-04-08 NOTE — Progress Notes (Signed)
Pt has been refusing Lovenox. Ok to place SCDs per Junious Silk, NP.   Ulyses Southward, PharmD, BCIDP, AAHIVP, CPP Infectious Disease Pharmacist 04/08/2020 2:16 PM

## 2020-04-08 NOTE — Progress Notes (Addendum)
[facility] PROGRESS NOTE  Jim Dunn:[id number] DOB: 06-Sep-1965 DOA: 02/23/2020 PCP: System, Pcp Not In  Status: Inpatient---Remains inpatient appropriate because:Altered mental status and Unsafe d/c plan   Dispo: The patient is from: Home              Anticipated d/c is to: Group home              Anticipated d/c date is: > 3 days              Patient currently is not medically stable to d/c. **NEEDS SAFE DC PLAN** Currently awaiting placement, TOC team working on this.  Mother is currently working to obtain Medicaid/guardianship.  As of 7/21 she has submitted all required paperwork to [facility] for [facility] in another county but needs clarification regarding which [place] he should actually apply for. Unsafe for discharge based on underlying schizophrenia and history of recurrent noncompliance was which led to patient's current hospitalization  Discussed with LCSW/case management.  Given patient's current medical diagnosis he is not eligible for Medicaid but would be eligible for home which is patient's discharge preference.  Given this would need disability funds to provide payment we are looking at group homes outside the [facility] area preferably in [facility] where patient previously resided.  Code Status: Full Family Communication: 7/28 updated patient's mother Jim Dunn by telephone [phone] or [phone] DVT prophylaxis: 8/5 has been refusing Lovenox over the past several days therefore switch to SCDs since ambulating without difficulty Vaccination status: Vaccinated   HPI: 54 year old male with past medical history of schizophrenia, seizure disorder, biventricular heart failure(Echo 01/2020 EF 25-30%with right-sided heart failure),severe tricuspid regurgitation, nicotine dependence and COPD who presents to [facility] emergency department with suicidal ideation and seizure.Patient explains that since he left the hospital several days  ago(admitted6/17-6/19-was being managed for hyponatremia, thought to be multifactorial in origin. Patient left AMA on6/19 and that time had sodium of 132),he has been experiencing increasing fatigue. This increasing fatigue is associated with bilateral lower extremity tingling. Symptomsweresimilar to what prompted his presentation on 6/17. Additionally, he reported feeling depressed withsuicidal ideation, withouta plan. Patient reportedthat he hadattempted suicide in the distant past using pills but was unsuccessful and never sought medical attention. While the patient was waiting in the waiting room, he experienced a witnessed seizure while attempting to walk to the bathroom. Patient was immediately brought back to the emergency department for further evaluation. Patient was found to have substantial recurrent hyponatremia of 117 with concurrent hypokalemia of 2.6 and hypomagnesium of 1.4. Patient was administered 1236mg  of intravenous Fosphenytoinand initiated on low rate normal saline infusion. Patient was administered 2 g of intravenous magnesium and a 30 mEq of potassium.  Patient's electrolytes have improved, no further seizure episodes at this time.  Echo showed EF of 25-30%, cardiology recommended outpatient follow-up but otherwise continue Coreg, torsemide and Entresto.    Subjective: Standing at sink shaving. States is feeling a little restless and anxious and was requesting to have his Invega administered a few days early.  Discussed with patient does not want option but plan to give as needed Klonopin every 12 hours till receives Invega dose then discontinue.  Patient agreeable.  Objective: Vitals:   04/08/20 0351 04/08/20 0740  BP: 138/81 (!) 136/91  Pulse: 86 82  Resp: 19 16  Temp: 98.4 F (36.9 C) 98.2 F (36.8 C)  SpO2: 99% 98%    Intake/Output Summary (Last 24 hours) at 04/08/2020 1212 Last data filed  at 04/07/2020 2324 Gross per 24 hour  Intake --  Output 500  ml  Net -500 ml   Filed Weights   04/06/20 0558 04/07/20 0602 04/08/20 0351  Weight: 94.3 kg 94.2 kg 95.7 kg    Exam:  Constitutional: NAD, calm, comfortable HEENT: Extremely poor dentition throughout mouth with multiple broken teeth in front aspect of upper gums.   Respiratory: clear to auscultation bilaterally, Normal respiratory effort at rest.  Room air Cardiovascular: Regular rate and rhythm, no murmurs / rubs / gallops. No lower extremity edema.  No JVD.  Peripheral pulses easily palpable. Abdomen: no tenderness, no masses palpated.  Bowel sounds positive.  Tolerating solid diet.   Neurologic: CN 2-12 grossly intact. Sensation intact,Strength 5/5 x all 4 extremities. Ambulates independently Psychiatric: Alert and oriented x 3 although does not have appropriate insight or capacity to manage care independently 2/2 longstanding schizophrenia.  Pleasant mood.    Assessment/Plan:  Hyponatremia with seizures: Intravascular dehydration, resolved Held home diuretic diuretics- torsemide, metolazone discontinued.   Na+ 133 on 7/21-repeat again on 7/22 was 132-this point can check prn Currently appears euvolemic 7/31 Na stable 131  Lip edema/dental caries with abscess Resolved Patient developed low-grade fever and evolving upper lip swelling along with pain to upper gum, lips and frontal sinuses Orthopantogram revealed a lucency around the roots of right side posterior molar suggestive of possible infection Has completed IV Unasyn and Augmentin as of 8/2 Continue Tylenol as needed for any discomfort  Chronic congestive heart failure with reduced ejection fraction, 25%.-Associated right heart failure Euvolemic for several days but developed trace edema for was initiated on low-dose Lasix-sodium decreased slightly but otherwise has remained stable.   Continue low-dose Lasix  Echo in May 2021-EF 25-30% Continue Coreg, Entresto.   Daily weights with strict I's/O On heart healthy  diet without fluid restriction Digoxin level subtherapeutic at <0.02 therefore dose increased to 0.125 mg daily-repeat level 7/25 also subtherapeutic but no indication to continue to follow given utilization for heart failure physiology only Counseled extensively by multiple providers regarding need to adhere to salt restricted diet  Depression, Suicidal ideations: Resolved, seen by psychiatry.  Recommended to continue BuSpar, Neurontin, Invega.   Follow-up outpatient psychiatry.   Received his routine Invega shot 7/8, takes this monthly-next dose due 8/8 On 8/5 patient was complaining of anxiety and restlessness and had requested his Invega shot early.  Explained to patient that this not possible but did begin Klonopin 0.5 mg every 12 hours prn.  This medication needs to be discontinued after patient receives Invega dose on 8/8  Seizure disorder: Stable Secondary to hyponatremia and issues of nonadherence with medications prior to admission.   Dilantin level subtherapeutic on 7/18 at 2.6 - Dilantin dosage increased to 200 mg twice daily-repeat level on 7/25 also subtherapeutic to therefore dosage increased at recommendation of pharmacy to 200 in the morning and 300 at bedtime Repeat Dilantin level 7/30 has increased but still remains low at 6.4-discussed with pharmacist and have increased a.m. dose from 200 to 300 mg; repeat Dilantin level 8.7 on 8/4-repeat level in 1 week.  Microcytic anemia Iron studies relatively normal, hemoglobin stable  OSA Continue nocturnal CPAP-the above regarding patient noncompliant  COPD: stable. As needed bronchodilators  Hyperlipidemia: Daily Lipitor 40 mg  Hypokalemia/hypomagnesemia Replace as needed  Prolonged QTC:  Appears to have resolved.   Can you Seroquel 200 mg daily.  Neuropathy Currently on gabapentin 300 mg 3 TID   On 7/14 added a bedtime dose  of 300 mg with improvement in discomfort  Modest obesity Estimated body mass index  is 32.08 kg/m as calculated from the following:   Height as of this encounter: 5\' 8"  (1.727 m).   Weight as of this encounter: 95.7 kg.      Data Reviewed: Basic Metabolic Panel: Recent Labs  Lab 04/03/20 0924  NA 131*  K 3.8  CL 98  CO2 23  GLUCOSE 155*  BUN 9  CREATININE 0.52*  CALCIUM 9.3   Liver Function Tests: Recent Labs  Lab 04/07/20 0330  ALBUMIN 3.5   No results for input(s): LIPASE, AMYLASE in the last 168 hours. No results for input(s): AMMONIA in the last 168 hours. CBC: No results for input(s): WBC, NEUTROABS, HGB, HCT, MCV, PLT in the last 168 hours. Cardiac Enzymes: No results for input(s): CKTOTAL, CKMB, CKMBINDEX, TROPONINI in the last 168 hours. BNP (last 3 results) Recent Labs    01/22/20 0410 02/24/20 0030  BNP 697.9* 184.7*    ProBNP (last 3 results) No results for input(s): PROBNP in the last 8760 hours.  CBG: No results for input(s): GLUCAP in the last 168 hours.  No results found for this or any previous visit (from the past 240 hour(s)).   Studies: No results found.  Scheduled Meds: . atorvastatin  40 mg Oral Daily  . busPIRone  10 mg Oral TID  . carvedilol  3.125 mg Oral BID WC  . digoxin  0.125 mg Oral Daily  . enoxaparin (LOVENOX) injection  40 mg Subcutaneous Daily  . fluticasone furoate-vilanterol  1 puff Inhalation Daily  . furosemide  40 mg Oral Daily  . gabapentin  300 mg Oral QID  . phenytoin  300 mg Oral BID  . QUEtiapine  200 mg Oral Daily  . sacubitril-valsartan  1 tablet Oral BID   Continuous Infusions:   Principal Problem:   Hyponatremia Active Problems:   Chronic systolic CHF (congestive heart failure) (HCC)   Mixed hyperlipidemia   COPD (chronic obstructive pulmonary disease) (HCC)   Seizure disorder (HCC)   Paranoid schizophrenia (HCC)   Hypokalemia   Hypomagnesemia   Suicidal ideation   Noncompliance with medications   Subtherapeutic serum dilantin level   Seizure  (HCC)        Consultants:  None  Procedures:  None  Antibiotics: Anti-infectives (From admission, onward)   Start     Dose/Rate Route Frequency Ordered Stop   04/02/20 2200  amoxicillin-clavulanate (AUGMENTIN) 875-125 MG per tablet 1 tablet        1 tablet Oral Every 12 hours 04/02/20 1405 04/04/20 2130   03/29/20 0930  Ampicillin-Sulbactam (UNASYN) 3 g in sodium chloride 0.9 % 100 mL IVPB  Status:  Discontinued        3 g 200 mL/hr over 30 Minutes Intravenous Every 6 hours 03/29/20 0915 04/02/20 1405       Time spent: 15 minutes    04/04/20 ANP  [facility] Pager [phone]. If 7PM-7AM, please contact night-coverage at [email or web address] 04/08/2020, 12:12 PM  LOS: 45 days

## 2020-04-09 DIAGNOSIS — E871 Hypo-osmolality and hyponatremia: Secondary | ICD-10-CM | POA: Diagnosis not present

## 2020-04-09 NOTE — Progress Notes (Signed)
  PROGRESS NOTE    Sahith Nurse   JPE:[id number]  DOB: 1966/07/08  DOA: 02/23/2020     SUBJECTIVE: Patient sleeping, he has no complaints headache, chest pain, seizures, fever, dyspnea, nausea, vomiting.    OBJECTIVE: BP 108/89 (BP Location: Right Arm)   Pulse 86   Temp 98.4 F (36.9 C) (Oral)   Resp 18   Ht 5\' 8"  (1.727 m)   Wt 95.7 kg   SpO2 96%   BMI 32.08 kg/m   General: Adult male, lying in bed, no acute distress HEENT:    Cardiac: RRR, no murmurs, no lower extremity edema Respiratory: Normal respiratory rate and rhythm, lungs clear without rales or wheezes Abdomen: No tenderness to palpation or guarding, no ascites or distention Extremities:  Neuro: Sleeping but arousable, makes eye contact, moves upper extremities with normal strength and coordination, speech fluent Psych: Unable to assess     ASSESSMENT AND PLAN:  Seizure No seizure activity -Continue phenytoin  Hyponatremia Sodium stable, asymptomatic  Chronic systolic CHF Chronic right heart failure Appears euvolemic -Continue atorvastatin, carvedilol, digoxin, furosemide, Entresto  Depression/anxiety -Continue Seroquel  Anemia Hemoglobin stable around 10  OSA -CPAP at night  COPD No evidence of flare -Continue ICS/LABA      MD 04/10/2020

## 2020-04-09 NOTE — Progress Notes (Signed)
[facility] PROGRESS NOTE  Jim Dunn CBJ:[id number] DOB: November 18, 1965 DOA: 02/23/2020 PCP: System, Pcp Not In  Status: Inpatient---Remains inpatient appropriate because:Altered mental status and Unsafe d/c plan   Dispo: The patient is from: Home              Anticipated d/c is to: Group home              Anticipated d/c date is: > 3 days              Patient currently is not medically stable to d/c. **NEEDS SAFE DC PLAN** Currently awaiting placement, TOC team working on this.  Mother is currently working to obtain Medicaid/guardianship.  As of 7/21 she has submitted all required paperwork to [facility] for [facility] in another county but needs clarification regarding which [place] he should actually apply for. Unsafe for discharge based on underlying schizophrenia and history of recurrent noncompliance was which led to patient's current hospitalization  Discussed with LCSW/case management.  Given patient's current medical diagnosis he is not eligible for Medicaid but would be eligible for home which is patient's discharge preference.  Given this would need disability funds to provide payment we are looking at group homes outside the [facility] [place] [name] area preferably in [facility] where patient previously resided.  Code Status: Full Family Communication: 7/28 updated [name] by telephone [phone] or [phone] DVT prophylaxis: 8/5 has been refusing Lovenox over the past several days therefore switch to SCDs since ambulating without difficulty Vaccination status: Vaccinated   HPI: 54 year old male with past medical history of schizophrenia, seizure disorder, biventricular heart failure(Echo 01/2020 EF 25-30%with right-sided heart failure),severe tricuspid regurgitation, nicotine dependence and COPD who presents to [facility] emergency department with suicidal ideation and seizure.Patient explains that since he left the hospital several days  ago(admitted6/17-6/19-was being managed for hyponatremia, thought to be multifactorial in origin. Patient left AMA on6/19 and that time had sodium of 132),he has been experiencing increasing fatigue. This increasing fatigue is associated with bilateral lower extremity tingling. Symptomsweresimilar to what prompted his presentation on 6/17. Additionally, he reported feeling depressed withsuicidal ideation, withouta plan. Patient reportedthat he hadattempted suicide in the distant past using pills but was unsuccessful and never sought medical attention. While the patient was waiting in the waiting room, he experienced a witnessed seizure while attempting to walk to the bathroom. Patient was immediately brought back to the emergency department for further evaluation. Patient was found to have substantial recurrent hyponatremia of 117 with concurrent hypokalemia of 2.6 and hypomagnesium of 1.4. Patient was administered 1236mg  of intravenous Fosphenytoinand initiated on low rate normal saline infusion. Patient was administered 2 g of intravenous magnesium and a 30 mEq of potassium.  Patient's electrolytes have improved, no further seizure episodes at this time.  Echo showed EF of 25-30%, cardiology recommended outpatient follow-up but otherwise continue Coreg, torsemide and Entresto.    Subjective: Feels better and less anxious and restless with as needed Klonopin.  He has been made aware that once he receives his monthly injection of Invega on 8/8 that be Klonopin will be discontinued.  Verbalized understanding.  Objective: Vitals:   04/09/20 0756 04/09/20 0814  BP:  131/85  Pulse:  72  Resp:  18  Temp:  97.6 F (36.4 C)  SpO2: 98% 99%    Intake/Output Summary (Last 24 hours) at 04/09/2020 1105 Last data filed at 04/09/2020 0900 Gross per 24 hour  Intake 910 ml  Output --  Net 910 ml  Filed Weights   04/06/20 0558 04/07/20 0602 04/08/20 0351  Weight: 94.3 kg 94.2 kg 95.7 kg     Exam:  Constitutional: NAD, calm, comfortable HEENT: Extremely poor dentition throughout mouth with multiple broken teeth in front aspect of upper gums.   Respiratory: clear to auscultation bilaterally, Normal respiratory effort at rest.  Room air Cardiovascular: Regular rate and rhythm, no murmurs / rubs / gallops. No lower extremity edema.  No JVD.  Peripheral pulses easily palpable. Abdomen: no tenderness, no masses palpated.  Bowel sounds positive.  Tolerating solid diet.   Neurologic: CN 2-12 grossly intact. Sensation intact,Strength 5/5 x all 4 extremities. Ambulates independently Psychiatric: Alert and oriented x 3 although does not have appropriate insight or capacity to manage care independently 2/2 longstanding schizophrenia.  Pleasant mood.    Assessment/Plan:  Hyponatremia with seizures: Intravascular dehydration, resolved Held home diuretic diuretics- torsemide, metolazone discontinued.   Na+ 133 on 7/21-repeat again on 7/22 was 132-this point can check prn Currently appears euvolemic 7/31 Na stable 131  Lip edema/dental caries with abscess Resolved Patient developed low-grade fever and evolving upper lip swelling along with pain to upper gum, lips and frontal sinuses Orthopantogram revealed a lucency around the roots of right side posterior molar suggestive of possible infection Has completed IV Unasyn and Augmentin as of 8/2 Continue Tylenol as needed for any discomfort  Chronic congestive heart failure with reduced ejection fraction, 25%.-Associated right heart failure Euvolemic for several days but developed trace edema for was initiated on low-dose Lasix-sodium decreased slightly but otherwise has remained stable.   Continue low-dose Lasix  Echo in May 2021-EF 25-30% Continue Coreg, Entresto.   Daily weights with strict I's/O On heart healthy diet without fluid restriction Digoxin level subtherapeutic at <0.02 therefore dose increased to 0.125 mg  daily-repeat level 7/25 also subtherapeutic but no indication to continue to follow given utilization for heart failure physiology only Counseled extensively by multiple providers regarding need to adhere to salt restricted diet  Depression, Suicidal ideations: Resolved, seen by psychiatry.  Recommended to continue BuSpar, Neurontin, Invega.   Follow-up outpatient psychiatry.   Received his routine Invega shot 7/8, takes this monthly-next dose due 8/8 On 8/5 patient was complaining of anxiety and restlessness and had requested his Invega shot early.  Explained to patient that this not possible but did begin Klonopin 0.5 mg every 12 hours prn.  This medication needs to be discontinued after patient receives Invega dose on 8/8  Seizure disorder: Stable Secondary to hyponatremia and issues of nonadherence with medications prior to admission.   Dilantin level subtherapeutic on 7/18 at 2.6 - Dilantin dosage increased to 200 mg twice daily-repeat level on 7/25 also subtherapeutic to therefore dosage increased at recommendation of pharmacy to 200 in the morning and 300 at bedtime Repeat Dilantin level 7/30 has increased but still remains low at 6.4-discussed with pharmacist and have increased a.m. dose from 200 to 300 mg; repeat Dilantin level 8.7 on 8/4-repeat level in 1 week.  Microcytic anemia Iron studies relatively normal, hemoglobin stable  OSA Continue nocturnal CPAP-the above regarding patient noncompliant  COPD: stable. As needed bronchodilators  Hyperlipidemia: Daily Lipitor 40 mg  Hypokalemia/hypomagnesemia Replace as needed  Prolonged QTC:  Appears to have resolved.   Can you Seroquel 200 mg daily.  Neuropathy Currently on gabapentin 300 mg 3 TID   On 7/14 added a bedtime dose of 300 mg with improvement in discomfort  Modest obesity Estimated body mass index is 32.08 kg/m as calculated from  the following:   Height as of this encounter: 5\' 8"  (1.727 m).   Weight  as of this encounter: 95.7 kg.      Data Reviewed: Basic Metabolic Panel: Recent Labs  Lab 04/03/20 0924  NA 131*  K 3.8  CL 98  CO2 23  GLUCOSE 155*  BUN 9  CREATININE 0.52*  CALCIUM 9.3   Liver Function Tests: Recent Labs  Lab 04/07/20 0330  ALBUMIN 3.5   No results for input(s): LIPASE, AMYLASE in the last 168 hours. No results for input(s): AMMONIA in the last 168 hours. CBC: No results for input(s): WBC, NEUTROABS, HGB, HCT, MCV, PLT in the last 168 hours. Cardiac Enzymes: No results for input(s): CKTOTAL, CKMB, CKMBINDEX, TROPONINI in the last 168 hours. BNP (last 3 results) Recent Labs    01/22/20 0410 02/24/20 0030  BNP 697.9* 184.7*    ProBNP (last 3 results) No results for input(s): PROBNP in the last 8760 hours.  CBG: No results for input(s): GLUCAP in the last 168 hours.  No results found for this or any previous visit (from the past 240 hour(s)).   Studies: No results found.  Scheduled Meds: . atorvastatin  40 mg Oral Daily  . busPIRone  10 mg Oral TID  . carvedilol  3.125 mg Oral BID WC  . digoxin  0.125 mg Oral Daily  . enoxaparin (LOVENOX) injection  40 mg Subcutaneous Daily  . fluticasone furoate-vilanterol  1 puff Inhalation Daily  . furosemide  40 mg Oral Daily  . gabapentin  300 mg Oral QID  . phenytoin  300 mg Oral BID  . QUEtiapine  200 mg Oral Daily  . sacubitril-valsartan  1 tablet Oral BID   Continuous Infusions:   Principal Problem:   Hyponatremia Active Problems:   Chronic systolic CHF (congestive heart failure) (HCC)   Mixed hyperlipidemia   COPD (chronic obstructive pulmonary disease) (HCC)   Seizure disorder (HCC)   Paranoid schizophrenia (HCC)   Hypokalemia   Hypomagnesemia   Suicidal ideation   Noncompliance with medications   Subtherapeutic serum dilantin level   Seizure (HCC)        Consultants:  None  Procedures:  None  Antibiotics: Anti-infectives (From admission, onward)   Start      Dose/Rate Route Frequency Ordered Stop   04/02/20 2200  amoxicillin-clavulanate (AUGMENTIN) 875-125 MG per tablet 1 tablet        1 tablet Oral Every 12 hours 04/02/20 1405 04/04/20 2130   03/29/20 0930  Ampicillin-Sulbactam (UNASYN) 3 g in sodium chloride 0.9 % 100 mL IVPB  Status:  Discontinued        3 g 200 mL/hr over 30 Minutes Intravenous Every 6 hours 03/29/20 0915 04/02/20 1405       Time spent: 15 minutes    04/04/20 ANP  [facility] Pager [phone]. If 7PM-7AM, please contact night-coverage at [email or web address] 04/09/2020, 11:05 AM  LOS: 46 days

## 2020-04-10 DIAGNOSIS — E871 Hypo-osmolality and hyponatremia: Secondary | ICD-10-CM | POA: Diagnosis not present

## 2020-04-10 NOTE — Progress Notes (Signed)
   04/10/20 2300  Provider Notification  Provider Name/Title Dr Loney Loh  Date Provider Notified 04/10/20  Time Provider Notified 2300  Notification Type Page  Notification Reason Other (Comment) (Pt bilateral breast observed to be slightly enlarged)  Response No new orders (said pt will be reviewed in the morning by admitting Doc)  Date of Provider Response 04/10/20  Time of Provider Response 2304

## 2020-04-11 DIAGNOSIS — E871 Hypo-osmolality and hyponatremia: Secondary | ICD-10-CM | POA: Diagnosis not present

## 2020-04-11 MED ORDER — PALIPERIDONE PALMITATE ER 234 MG/1.5ML IM SUSY
234.0000 mg | PREFILLED_SYRINGE | Freq: Once | INTRAMUSCULAR | Status: AC
  Administered 2020-04-11: 234 mg via INTRAMUSCULAR
  Filled 2020-04-11 (×2): qty 1.5

## 2020-04-11 NOTE — Progress Notes (Signed)
°  PROGRESS NOTE    Jim Dunn   WHQ:[id number]  DOB: 1965/11/06  DOA: 02/23/2020     SUBJECTIVE: Patient lying in bed, no acute distress, no headache, chest pain, seizures, dyspnea, nausea, vomiting.   OBJECTIVE: BP 125/80 (BP Location: Left Arm)    Pulse 88    Temp 98 F (36.7 C)    Resp 18    Ht 5\' 8"  (1.727 m)    Wt 94.8 kg    SpO2 97%    BMI 31.76 kg/m   General appearance: Adult male, lying in bed, no acute distress, blanket over his head     HEENT:    Skin: The left nipple appears red and swollen, there is no underlying gynecomastia, which seems slightly edematous or irritated Cardiac: RRR, no murmurs, no lower extremity edema Respiratory: Normal respiratory rate and rhythm, lungs clear without rales or wheezes Abdomen: No tenderness palpation or guarding, no ascites or distention MSK:  Neuro: Awake alert, interactive, makes eye contact, moves upper extremities with normal strength and coordination, speech fluent Psych: Attention normal, affect normal, judgment impaired slightly impaired, no hallucinations or psychosis         ASSESSMENT AND PLAN:  History of seizures No seizure activity -Continue phenytoin  Hyponatremia Sodium stable, asymptomatic  Chronic systolic CHF Chronic right heart failure Appears euvolemic -Continue atorvastatin, carvedilol, digoxin, furosemide, Entresto  Schizoaffective disorder -Continue Seroquel -Give -Stop clonazepam  Nipple irritation This is not gynecomastia, there is no tissue underlying the breast, it appears to be irritated nipple -Observe/monitor  Anemia Hemoglobin stable around 10  OSA -CPAP at night  COPD No evidence of flare -Continue ICS/LABA      [place] MD 04/11/2020

## 2020-04-11 NOTE — Progress Notes (Signed)
Patient's last dose of [place] was 03/11/2020. Due for next dose today 04/11/2020. Discussed with Dr. Maryfrances Bunnell and put order in for [facility] Sustenna 234 mg.   Pervis Hocking, PharmD PGY1 Pharmacy Resident 04/11/2020 12:57 PM  Please check [email or web address] for unit-specific pharmacy phone numbers.

## 2020-04-11 NOTE — Progress Notes (Signed)
Pt states he does not want to wear CPAP tonight. I told him if he changes his mind to call RT

## 2020-04-12 DIAGNOSIS — E871 Hypo-osmolality and hyponatremia: Secondary | ICD-10-CM | POA: Diagnosis not present

## 2020-04-12 LAB — BASIC METABOLIC PANEL
Anion gap: 6 (ref 5–15)
BUN: 10 mg/dL (ref 6–20)
CO2: 27 mmol/L (ref 22–32)
Calcium: 9.3 mg/dL (ref 8.9–10.3)
Chloride: 94 mmol/L — ABNORMAL LOW (ref 98–111)
Creatinine, Ser: 0.5 mg/dL — ABNORMAL LOW (ref 0.61–1.24)
GFR calc Af Amer: >60 mL/min (ref >60)
GFR calc non Af Amer: >60 mL/min (ref >60)
Glucose, Bld: 96 mg/dL (ref 70–99)
Potassium: 4 mmol/L (ref 3.5–5.1)
Sodium: 127 mmol/L — ABNORMAL LOW (ref 135–145)

## 2020-04-12 MED ORDER — FUROSEMIDE 40 MG PO TABS
40.0000 mg | ORAL_TABLET | Freq: Two times a day (BID) | ORAL | Status: DC
  Administered 2020-04-12 – 2020-04-14 (×6): 40 mg via ORAL
  Filled 2020-04-12 (×6): qty 1

## 2020-04-12 NOTE — Progress Notes (Signed)
[facility] PROGRESS NOTE  Jim Dunn ZOX:[id number] DOB: Jan 21, 1966 DOA: 02/23/2020 PCP: System, Pcp Not In  Status: Inpatient---Remains inpatient appropriate because:Altered mental status and Unsafe d/c plan   Dispo: The patient is from: Home              Anticipated d/c is to: Group home              Anticipated d/c date is: > 3 days              Patient currently is not medically stable to d/c. **NEEDS SAFE DC PLAN** Currently awaiting placement, TOC team working on this.  Mother is currently working to obtain Medicaid/guardianship. Unsafe for discharge based on underlying schizophrenia and history of recurrent noncompliance which led to patient's current hospitalization  Discussed with LCSW/case management. Mother has applied for Medicaid/financial assistance through [facility] where patient previously resided.  Code Status: Full Family Communication: 7/28 updated [name] by telephone [phone] or [phone] DVT prophylaxis: 8/5 has been refusing Lovenox over the past several days therefore switch to SCDs since ambulating without difficulty Vaccination status: Vaccinated   HPI: 54 year old male with past medical history of schizophrenia, seizure disorder, biventricular heart failure(Echo 01/2020 EF 25-30%with right-sided heart failure),severe tricuspid regurgitation, nicotine dependence and COPD who presents to [facility] emergency department with suicidal ideation and seizure.Patient explains that since he left the hospital several days ago(admitted6/17-6/19-was being managed for hyponatremia, thought to be multifactorial in origin. Patient left AMA on6/19 and that time had sodium of 132),he has been experiencing increasing fatigue. This increasing fatigue is associated with bilateral lower extremity tingling. Symptomsweresimilar to what prompted his presentation on 6/17. Additionally, he reported feeling depressed withsuicidal  ideation, withouta plan. Patient reportedthat he hadattempted suicide in the distant past using pills but was unsuccessful and never sought medical attention. While the patient was waiting in the waiting room, he experienced a witnessed seizure while attempting to walk to the bathroom. Patient was immediately brought back to the emergency department for further evaluation. Patient was found to have substantial recurrent hyponatremia of 117 with concurrent hypokalemia of 2.6 and hypomagnesium of 1.4. Patient was administered 1236mg  of intravenous Fosphenytoinand initiated on low rate normal saline infusion. Patient was administered 2 g of intravenous magnesium and a 30 mEq of potassium.  Patient's electrolytes have improved, no further seizure episodes at this time.  Echo showed EF of 25-30%, cardiology recommended outpatient follow-up but otherwise continue Coreg, torsemide and Entresto.    Subjective: Denies shortness of breath or chest pain.  Denies orthopnea. Patient apparently fell out of bed during the night but did not awaken and nurse found him sleeping on the floor uninjured.  Family/mother updated by patient and nurse.  Objective: Vitals:   04/12/20 0759 04/12/20 0823  BP: 128/78   Pulse: 98   Resp: 20   Temp: 98.6 F (37 C)   SpO2: 99% 95%    Intake/Output Summary (Last 24 hours) at 04/12/2020 1119 Last data filed at 04/12/2020 0400 Gross per 24 hour  Intake --  Output 650 ml  Net -650 ml   Filed Weights   04/10/20 0900 04/11/20 0600 04/12/20 0701  Weight: 94.8 kg 94.8 kg 95.3 kg    Exam:  Constitutional: NAD, calm, comfortable HEENT: Extremely poor dentition   Respiratory: clear to auscultation bilaterally on posterior exam, Normal respiratory effort at rest.  Room air Cardiovascular: Regular rate and rhythm, no murmurs / rubs / gallops. No lower extremity edema.  No JVD.  Peripheral pulses easily palpable. Abdomen: no tenderness, no masses palpated.  Bowel sounds  positive.  Tolerating solid diet.   Neurologic: CN 2-12 grossly intact. Sensation intact,Strength 5/5 x all 4 extremities. Ambulates independently Psychiatric: Alert and oriented x 3 although does not have appropriate insight or capacity to manage care independently 2/2 longstanding schizophrenia.  Pleasant mood.    Assessment/Plan:  Hyponatremia with seizures: Intravascular dehydration, resolved Held home diuretic diuretics OF torsemide, metolazone which were eventually discontinued.   Na+ had been stable between 131 and 133 but as of 8/9 down to 127 Weight increasing and in positive balance I/O therefore back some mild volume overload therefore we will treat additional doses of diuretics and follow electrolyte panel daily  Lip edema/dental caries with abscess Resolved Patient developed low-grade fever and evolving upper lip swelling along with pain to upper gum, lips and frontal sinuses Orthopantogram revealed a lucency around the roots of right side posterior molar suggestive of possible infection Has completed IV Unasyn and Augmentin as of 8/2  Chronic congestive heart failure with reduced ejection fraction, 25%.-Associated right heart failure Euvolemic for several days but developed trace edema for was initiated on low-dose Lasix-sodium decreased slightly but otherwise has remained stable.   Sodium has decreased to 127 and is in +3 L balance.  Dry weight appears to be around 3 to 205 pounds with current weight 210 pounds 8/9: Increase Lasix 40 mg to twice daily for at least 2 days; follow sodium closely Daily electrolyte panel Echo in May 2021-EF 25-30% Continue Coreg, Entresto.   Daily weights with strict I's/O On heart healthy diet without fluid restriction Digoxin level subtherapeutic at <0.02 therefore dose increased to 0.125 mg daily-repeat level 7/25 also subtherapeutic but no indication to continue to follow given utilization for heart failure physiology only Counseled  extensively by multiple providers regarding need to adhere to salt restricted diet  Depression, Suicidal ideations: Resolved, seen by psychiatry.  Recommended to continue BuSpar, Neurontin, Invega.   Follow-up outpatient psychiatry.   Received his routine Invega shot 7/8, takes this monthly-next dose due 8/8 On 8/5 patient was complaining of anxiety and restlessness and had requested his Invega shot early.  Explained to patient that this not possible but did begin Klonopin 0.5 mg every 12 hours prn.  This medication needs to be discontinued after patient receives Invega dose on 8/8  Seizure disorder: Stable Secondary to hyponatremia and issues of nonadherence with medications prior to admission.   Dilantin level subtherapeutic on 7/18 at 2.6 - Dilantin dosage increased to 200 mg twice daily-repeat level on 7/25 also subtherapeutic to therefore dosage increased at recommendation of pharmacy to 200 in the morning and 300 at bedtime Repeat Dilantin level 7/30 has increased but still remains low at 6.4-discussed with pharmacist and have increased a.m. dose from 200 to 300 mg; repeat Dilantin level 8.7 on 8/4-repeat level in 1 week.  Microcytic anemia Iron studies relatively normal, hemoglobin stable  OSA Continue nocturnal CPAP-the above regarding patient noncompliant  COPD: stable. As needed bronchodilators  Hyperlipidemia: Daily Lipitor 40 mg  Hypokalemia/hypomagnesemia Replace as needed  Prolonged QTC:  Appears to have resolved.   Can you Seroquel 200 mg daily.  Neuropathy Currently on gabapentin 300 mg 3 TID   On 7/14 added a bedtime dose of 300 mg with improvement in discomfort  Modest obesity Estimated body mass index is 31.95 kg/m as calculated from the following:   Height as of this encounter: 5\' 8"  (1.727 m).  Weight as of this encounter: 95.3 kg.      Data Reviewed: Basic Metabolic Panel: Recent Labs  Lab 04/12/20 0414  NA 127*  K 4.0  CL 94*   CO2 27  GLUCOSE 96  BUN 10  CREATININE 0.50*  CALCIUM 9.3   Liver Function Tests: Recent Labs  Lab 04/07/20 0330  ALBUMIN 3.5   No results for input(s): LIPASE, AMYLASE in the last 168 hours. No results for input(s): AMMONIA in the last 168 hours. CBC: No results for input(s): WBC, NEUTROABS, HGB, HCT, MCV, PLT in the last 168 hours. Cardiac Enzymes: No results for input(s): CKTOTAL, CKMB, CKMBINDEX, TROPONINI in the last 168 hours. BNP (last 3 results) Recent Labs    01/22/20 0410 02/24/20 0030  BNP 697.9* 184.7*    ProBNP (last 3 results) No results for input(s): PROBNP in the last 8760 hours.  CBG: No results for input(s): GLUCAP in the last 168 hours.  No results found for this or any previous visit (from the past 240 hour(s)).   Studies: No results found.  Scheduled Meds: . atorvastatin  40 mg Oral Daily  . busPIRone  10 mg Oral TID  . carvedilol  3.125 mg Oral BID WC  . digoxin  0.125 mg Oral Daily  . enoxaparin (LOVENOX) injection  40 mg Subcutaneous Daily  . fluticasone furoate-vilanterol  1 puff Inhalation Daily  . furosemide  40 mg Oral BID  . gabapentin  300 mg Oral QID  . phenytoin  300 mg Oral BID  . QUEtiapine  200 mg Oral Daily  . sacubitril-valsartan  1 tablet Oral BID   Continuous Infusions:   Principal Problem:   Hyponatremia Active Problems:   Chronic systolic CHF (congestive heart failure) (HCC)   Mixed hyperlipidemia   COPD (chronic obstructive pulmonary disease) (HCC)   Seizure disorder (HCC)   Paranoid schizophrenia (HCC)   Hypokalemia   Hypomagnesemia   Suicidal ideation   Noncompliance with medications   Subtherapeutic serum dilantin level   Seizure (HCC)        Consultants:  None  Procedures:  None  Antibiotics: Anti-infectives (From admission, onward)   Start     Dose/Rate Route Frequency Ordered Stop   04/02/20 2200  amoxicillin-clavulanate (AUGMENTIN) 875-125 MG per tablet 1 tablet        1 tablet  Oral Every 12 hours 04/02/20 1405 04/04/20 2130   03/29/20 0930  Ampicillin-Sulbactam (UNASYN) 3 g in sodium chloride 0.9 % 100 mL IVPB  Status:  Discontinued        3 g 200 mL/hr over 30 Minutes Intravenous Every 6 hours 03/29/20 0915 04/02/20 1405       Time spent: 20 minutes    Junious Silk ANP  [facility] Pager [phone]. If 7PM-7AM, please contact night-coverage at [email or web address] 04/12/2020, 11:19 AM  LOS: 49 days

## 2020-04-12 NOTE — Progress Notes (Signed)
   04/12/20 0120  What Happened  Was fall witnessed? No  Was patient injured? No  Patient found on floor (at bedside)  Found by Staff-comment (Phyl RN)  Stated prior activity other (comment) (SLEEPING, Slid out of bed)  Follow Up  MD notified Dr Loney Loh  Time MD notified [phone]  Family notified Yes - comment (Mother)  Time family notified 0121 (called by patient)  Additional tests No  Progress note created (see row info) Yes  Adult Fall Risk Assessment  Risk Factor Category (scoring not indicated) Fall has occurred during this admission (document High fall risk)  Age 54  Fall History: Fall within 6 months prior to admission 0  Elimination; Bowel and/or Urine Incontinence 0  Elimination; Bowel and/or Urine Urgency/Frequency 0  Medications: includes PCA/Opiates, Anti-convulsants, Anti-hypertensives, Diuretics, Hypnotics, Laxatives, Sedatives, and Psychotropics 3  Patient Care Equipment 1  Mobility-Assistance 0  Mobility-Gait 0  Mobility-Sensory Deficit 0  Altered awareness of immediate physical environment 0  Impulsiveness 0  Lack of understanding of one's physical/cognitive limitations 0  Total Score 4  Patient Fall Risk Level High fall risk  Adult Fall Risk Interventions  Required Bundle Interventions *See Row Information* High fall risk - low, moderate, and high requirements implemented  Additional Interventions Use of appropriate toileting equipment (bedpan, BSC, etc.)  Screening for Fall Injury Risk (To be completed on HIGH fall risk patients) - Assessing Need for Low Bed  Risk For Fall Injury- Low Bed Criteria None identified - Continue screening  Screening for Fall Injury Risk (To be completed on HIGH fall risk patients who do not meet crieteria for Low Bed) - Assessing Need for Floor Mats Only  Risk For Fall Injury- Criteria for Floor Mats None identified - No additional interventions needed

## 2020-04-13 DIAGNOSIS — E871 Hypo-osmolality and hyponatremia: Secondary | ICD-10-CM | POA: Diagnosis not present

## 2020-04-13 LAB — BASIC METABOLIC PANEL
Anion gap: 10 (ref 5–15)
BUN: 8 mg/dL (ref 6–20)
CO2: 24 mmol/L (ref 22–32)
Calcium: 9.3 mg/dL (ref 8.9–10.3)
Chloride: 94 mmol/L — ABNORMAL LOW (ref 98–111)
Creatinine, Ser: 0.52 mg/dL — ABNORMAL LOW (ref 0.61–1.24)
GFR calc Af Amer: >60 mL/min (ref >60)
GFR calc non Af Amer: >60 mL/min (ref >60)
Glucose, Bld: 112 mg/dL — ABNORMAL HIGH (ref 70–99)
Potassium: 3.8 mmol/L (ref 3.5–5.1)
Sodium: 128 mmol/L — ABNORMAL LOW (ref 135–145)

## 2020-04-13 NOTE — Progress Notes (Signed)
Pt requested to put on cpap after 12:00am.

## 2020-04-13 NOTE — Progress Notes (Signed)
[facility] PROGRESS NOTE  Jim Dunn IRJ:[id number] DOB: February 25, 1966 DOA: 02/23/2020 PCP: System, Pcp Not In  Status: Inpatient---Remains inpatient appropriate because:Altered mental status and Unsafe d/c plan   Dispo: The patient is from: Home              Anticipated d/c is to: Group home              Anticipated d/c date is: > 3 days              Patient currently is not medically stable to d/c. **NEEDS SAFE DC PLAN** Currently awaiting placement, TOC team working on this.  Mother is currently working to obtain Medicaid/guardianship. Unsafe for discharge based on underlying schizophrenia and history of recurrent noncompliance which led to patient's current hospitalization  Discussed with LCSW/case management. Mother has applied for Medicaid/financial assistance through [facility] where patient previously resided.  Code Status: Full Family Communication: 7/28 updated [name] by telephone [phone] or [phone] DVT prophylaxis: 8/5 has been refusing Lovenox over the past several days therefore switch to SCDs since ambulating without difficulty Vaccination status: Vaccinated   HPI: 54 year old male with past medical history of schizophrenia, seizure disorder, biventricular heart failure(Echo 01/2020 EF 25-30%with right-sided heart failure),severe tricuspid regurgitation, nicotine dependence and COPD who presents to [facility] emergency department with suicidal ideation and seizure.Patient explains that since he left the hospital several days ago(admitted6/17-6/19-was being managed for hyponatremia, thought to be multifactorial in origin. Patient left AMA on6/19 and that time had sodium of 132),he has been experiencing increasing fatigue. This increasing fatigue is associated with bilateral lower extremity tingling. Symptomsweresimilar to what prompted his presentation on 6/17. Additionally, he reported feeling depressed withsuicidal  ideation, withouta plan. Patient reportedthat he hadattempted suicide in the distant past using pills but was unsuccessful and never sought medical attention. While the patient was waiting in the waiting room, he experienced a witnessed seizure while attempting to walk to the bathroom. Patient was immediately brought back to the emergency department for further evaluation. Patient was found to have substantial recurrent hyponatremia of 117 with concurrent hypokalemia of 2.6 and hypomagnesium of 1.4. Patient was administered 1236mg  of intravenous Fosphenytoinand initiated on low rate normal saline infusion. Patient was administered 2 g of intravenous magnesium and a 30 mEq of potassium.  Patient's electrolytes have improved, no further seizure episodes at this time.  Echo showed EF of 25-30%, cardiology recommended outpatient follow-up but otherwise continue Coreg, torsemide and Entresto.    Subjective: No complaints verbalized.  When asked stated did wear his CPAP for most of the night.  Objective: Vitals:   04/13/20 0815 04/13/20 0850  BP: 115/77   Pulse: 85   Resp: 18   Temp: 98 F (36.7 C)   SpO2: 99% 99%    Intake/Output Summary (Last 24 hours) at 04/13/2020 1232 Last data filed at 04/13/2020 1100 Gross per 24 hour  Intake 924 ml  Output 900 ml  Net 24 ml   Filed Weights   04/11/20 0600 04/12/20 0701 04/13/20 0700  Weight: 94.8 kg 95.3 kg 94.8 kg    Exam:  Constitutional: NAD, calm, comfortable HEENT: Extremely poor dentition   Respiratory: clear to auscultation bilaterally on posterior exam, Normal respiratory effort at rest.  Room air Cardiovascular: Regular rate and rhythm, no murmurs / rubs / gallops. No lower extremity edema.  No JVD.  Peripheral pulses easily palpable. Abdomen: no tenderness, no masses palpated.  Bowel sounds positive.  Tolerating solid diet.  Neurologic: CN 2-12 grossly intact. Sensation intact,Strength 5/5 x all 4 extremities. Ambulates  independently Psychiatric: Alert and oriented x 3 although does not have appropriate insight or capacity to manage care independently 2/2 longstanding schizophrenia.  Pleasant mood.    Assessment/Plan:  Hyponatremia with seizures: Intravascular dehydration, resolved Held home diuretic diuretics OF torsemide, metolazone which were eventually discontinued.   Na+ had been stable between 131 and 133 but as of 8/9 down to 127 Weight increasing and in positive balance I/O therefore back some mild volume overload therefore we will treat additional doses of diuretics and follow electrolyte panel daily As of 8/10 sodium has increased slightly to 128 after initiation of diuretics therefore we will continue to treat heart failure as below  Lip edema/dental caries with abscess Resolved Patient developed low-grade fever and evolving upper lip swelling along with pain to upper gum, lips and frontal sinuses Orthopantogram revealed a lucency around the roots of right side posterior molar suggestive of possible infection Has completed IV Unasyn and Augmentin as of 8/2  Chronic congestive heart failure with reduced ejection fraction, 25%.-Associated right heart failure Euvolemic for several days but developed trace edema for was initiated on low-dose Lasix-sodium decreased slightly but otherwise has remained stable.   8/9 sodium had decreased to 127 and was in +3 L balance.  Dry weight appears to be around 3 to 205 pounds with current weight 210 pounds 8/9: Increased Lasix 40 mg to twice daily for at least 2 days; follow sodium closely Daily electrolyte panel Echo in May 2021-EF 25-30% Continue Coreg, Entresto.   Daily weights with strict I's/O-8/10 weight down about 1.5 pounds in the past 24 hours sodium up to 128 On heart healthy diet without fluid restriction Digoxin level subtherapeutic at <0.02 therefore dose increased to 0.125 mg daily-repeat level 7/25 also subtherapeutic but no indication to  continue to follow given utilization for heart failure physiology only Counseled extensively by multiple providers regarding need to adhere to salt restricted diet  Depression, Suicidal ideations: Resolved, seen by psychiatry.  Recommended to continue BuSpar, Neurontin, Invega.   Follow-up outpatient psychiatry.   Received his routine Invega shot 7/8, takes this monthly-next dose due 8/8 On 8/5 patient was complaining of anxiety and restlessness and had requested his Invega shot early.  Explained to patient that this not possible but did begin Klonopin 0.5 mg every 12 hours prn.  This medication needs to be discontinued after patient receives Invega dose on 8/8  Seizure disorder: Stable Secondary to hyponatremia and issues of nonadherence with medications prior to admission.   Dilantin level subtherapeutic on 7/18 at 2.6 - Dilantin dosage increased to 200 mg twice daily-repeat level on 7/25 also subtherapeutic to therefore dosage increased at recommendation of pharmacy to 200 in the morning and 300 at bedtime Repeat Dilantin level 7/30 has increased but still remains low at 6.4-discussed with pharmacist and have increased a.m. dose from 200 to 300 mg; repeat Dilantin level 8.7 on 8/4-repeat level in 1 week.  Microcytic anemia Iron studies relatively normal, hemoglobin stable  OSA Continue nocturnal CPAP-since mask changed compliance has improved  COPD: stable. As needed bronchodilators  Hyperlipidemia: Daily Lipitor 40 mg  Hypokalemia/hypomagnesemia Replace as needed  Prolonged QTC:  Appears to have resolved.   Can you Seroquel 200 mg daily.  Neuropathy Currently on gabapentin 300 mg 3 TID   On 7/14 added a bedtime dose of 300 mg with improvement in discomfort  Modest obesity Estimated body mass index is 31.76 kg/m as  calculated from the following:   Height as of this encounter: 5\' 8"  (1.727 m).   Weight as of this encounter: 94.8 kg.      Data  Reviewed: Basic Metabolic Panel: Recent Labs  Lab 04/12/20 0414 04/13/20 0305  NA 127* 128*  K 4.0 3.8  CL 94* 94*  CO2 27 24  GLUCOSE 96 112*  BUN 10 8  CREATININE 0.50* 0.52*  CALCIUM 9.3 9.3   Liver Function Tests: Recent Labs  Lab 04/07/20 0330  ALBUMIN 3.5   No results for input(s): LIPASE, AMYLASE in the last 168 hours. No results for input(s): AMMONIA in the last 168 hours. CBC: No results for input(s): WBC, NEUTROABS, HGB, HCT, MCV, PLT in the last 168 hours. Cardiac Enzymes: No results for input(s): CKTOTAL, CKMB, CKMBINDEX, TROPONINI in the last 168 hours. BNP (last 3 results) Recent Labs    01/22/20 0410 02/24/20 0030  BNP 697.9* 184.7*    ProBNP (last 3 results) No results for input(s): PROBNP in the last 8760 hours.  CBG: No results for input(s): GLUCAP in the last 168 hours.  No results found for this or any previous visit (from the past 240 hour(s)).   Studies: No results found.  Scheduled Meds: . atorvastatin  40 mg Oral Daily  . busPIRone  10 mg Oral TID  . carvedilol  3.125 mg Oral BID WC  . digoxin  0.125 mg Oral Daily  . enoxaparin (LOVENOX) injection  40 mg Subcutaneous Daily  . fluticasone furoate-vilanterol  1 puff Inhalation Daily  . furosemide  40 mg Oral BID  . gabapentin  300 mg Oral QID  . phenytoin  300 mg Oral BID  . QUEtiapine  200 mg Oral Daily  . sacubitril-valsartan  1 tablet Oral BID   Continuous Infusions:   Principal Problem:   Hyponatremia Active Problems:   Chronic systolic CHF (congestive heart failure) (HCC)   Mixed hyperlipidemia   COPD (chronic obstructive pulmonary disease) (HCC)   Seizure disorder (HCC)   Paranoid schizophrenia (HCC)   Hypokalemia   Hypomagnesemia   Suicidal ideation   Noncompliance with medications   Subtherapeutic serum dilantin level   Seizure (HCC)        Consultants:  None  Procedures:  None  Antibiotics: Anti-infectives (From admission, onward)   Start      Dose/Rate Route Frequency Ordered Stop   04/02/20 2200  amoxicillin-clavulanate (AUGMENTIN) 875-125 MG per tablet 1 tablet        1 tablet Oral Every 12 hours 04/02/20 1405 04/04/20 2130   03/29/20 0930  Ampicillin-Sulbactam (UNASYN) 3 g in sodium chloride 0.9 % 100 mL IVPB  Status:  Discontinued        3 g 200 mL/hr over 30 Minutes Intravenous Every 6 hours 03/29/20 0915 04/02/20 1405       Time spent: 20 minutes    04/04/20 ANP  [facility] Pager [phone]. If 7PM-7AM, please contact night-coverage at [email or web address] 04/13/2020, 12:32 PM  LOS: 50 days

## 2020-04-14 DIAGNOSIS — E871 Hypo-osmolality and hyponatremia: Secondary | ICD-10-CM | POA: Diagnosis not present

## 2020-04-14 LAB — BASIC METABOLIC PANEL
Anion gap: 10 (ref 5–15)
BUN: 10 mg/dL (ref 6–20)
CO2: 24 mmol/L (ref 22–32)
Calcium: 9.3 mg/dL (ref 8.9–10.3)
Chloride: 94 mmol/L — ABNORMAL LOW (ref 98–111)
Creatinine, Ser: 0.53 mg/dL — ABNORMAL LOW (ref 0.61–1.24)
GFR calc Af Amer: >60 mL/min (ref >60)
GFR calc non Af Amer: >60 mL/min (ref >60)
Glucose, Bld: 90 mg/dL (ref 70–99)
Potassium: 4 mmol/L (ref 3.5–5.1)
Sodium: 128 mmol/L — ABNORMAL LOW (ref 135–145)

## 2020-04-14 LAB — PHENYTOIN LEVEL, TOTAL: Phenytoin Lvl: 11.7 ug/mL (ref 10.0–20.0)

## 2020-04-14 LAB — VITAMIN B12: Vitamin B-12: 401 pg/mL (ref 180–914)

## 2020-04-14 MED ORDER — PREGABALIN 50 MG PO CAPS
50.0000 mg | ORAL_CAPSULE | Freq: Three times a day (TID) | ORAL | Status: DC
  Administered 2020-04-14 – 2020-04-27 (×40): 50 mg via ORAL
  Filled 2020-04-14 (×41): qty 1

## 2020-04-14 NOTE — Progress Notes (Signed)
Pt refused cpap for tonight 

## 2020-04-14 NOTE — TOC Progression Note (Signed)
Transition of Care [facility]) - Progression Note    Patient Details  Name: Jamonta Goerner MRN: [id number] Date of Birth: 1966/05/08  Transition of Care [facility]) CM/SW Contact  Baldemar Lenis, [place] Phone Number: 04/14/2020, 4:06 PM  Clinical Narrative:   CSW following for placement. CSW made the following phone calls:  [employer]: Number doesn't work [facility]: Interior and spatial designer not available, told CSW to call back tomorrow [address]: Left a Conservation officer, nature [facility]: Left a message with Air cabin crew Home: Number doesn't work [facility]: Left a Surveyor, mining  No beds available at this time. CSW to follow.    Expected Discharge Plan: Assisted Living Barriers to Discharge: Continued Medical Work up, Unsafe home situation  Expected Discharge Plan and Services Expected Discharge Plan: Assisted Living       Living arrangements for the past 2 months: Single Family Home                                       Social Determinants of Health (SDOH) Interventions    Readmission Risk Interventions Readmission Risk Prevention Plan 01/23/2020  Transportation Screening Complete  HRI or Home Care Consult Complete  Social Work Consult for Recovery Care Planning/Counseling Complete  Palliative Care Screening Not Applicable  Medication Review [employer]) (No Data)

## 2020-04-14 NOTE — Progress Notes (Addendum)
[facility] PROGRESS NOTE  Jim Dunn BJY:[id number] DOB: 1965-11-18 DOA: 02/23/2020 PCP: System, Pcp Not In  Status: Inpatient---Remains inpatient appropriate because:Altered mental status and Unsafe d/c plan   Dispo: The patient is from: Home              Anticipated d/c is to: Group home              Anticipated d/c date is: > 3 days              Patient currently is not medically stable to d/c. **NEEDS SAFE DC PLAN** Currently awaiting placement, TOC team working on this.  Mother is currently working to obtain Medicaid/guardianship. Unsafe for discharge based on underlying schizophrenia and history of recurrent noncompliance which led to patient's current hospitalization  Discussed with LCSW/case management. Mother has applied for Medicaid/financial assistance through [facility] where patient previously resided.  Code Status: Full Family Communication: 7/28 updated [name] by telephone [phone] or [phone] DVT prophylaxis: 8/5 has been refusing Lovenox over the past several days therefore switch to SCDs since ambulating without difficulty Vaccination status: Vaccinated   HPI: 54 year old male with past medical history of schizophrenia, seizure disorder, biventricular heart failure(Echo 01/2020 EF 25-30%with right-sided heart failure),severe tricuspid regurgitation, nicotine dependence and COPD who presents to [facility] emergency department with suicidal ideation and seizure.Patient explains that since he left the hospital several days ago(admitted6/17-6/19-was being managed for hyponatremia, thought to be multifactorial in origin. Patient left AMA on6/19 and that time had sodium of 132),he has been experiencing increasing fatigue. This increasing fatigue is associated with bilateral lower extremity tingling. Symptomsweresimilar to what prompted his presentation on 6/17. Additionally, he reported feeling depressed withsuicidal  ideation, withouta plan. Patient reportedthat he hadattempted suicide in the distant past using pills but was unsuccessful and never sought medical attention. While the patient was waiting in the waiting room, he experienced a witnessed seizure while attempting to walk to the bathroom. Patient was immediately brought back to the emergency department for further evaluation. Patient was found to have substantial recurrent hyponatremia of 117 with concurrent hypokalemia of 2.6 and hypomagnesium of 1.4. Patient was administered 1236mg  of intravenous Fosphenytoinand initiated on low rate normal saline infusion. Patient was administered 2 g of intravenous magnesium and a 30 mEq of potassium.  Patient's electrolytes have improved, no further seizure episodes at this time.  Echo showed EF of 25-30%, cardiology recommended outpatient follow-up but otherwise continue Coreg, torsemide and Entresto.    Subjective: Reporting a decreased numbness and tingling in his feet.  States gabapentin no longer working.  Objective: Vitals:   04/14/20 1102 04/14/20 1138  BP:  129/78  Pulse: 92 73  Resp:  16  Temp:  98.2 F (36.8 C)  SpO2:  97%    Intake/Output Summary (Last 24 hours) at 04/14/2020 1306 Last data filed at 04/14/2020 06/14/2020 Gross per 24 hour  Intake 2655 ml  Output 900 ml  Net 1755 ml   Filed Weights   04/11/20 0600 04/12/20 0701 04/13/20 0700  Weight: 94.8 kg 95.3 kg 94.8 kg    Exam:  Constitutional: NAD, calm, comfortable HEENT: Extremely poor dentition   Respiratory: clear to auscultation bilaterally on posterior exam, Normal respiratory effort at rest.  Room air Cardiovascular: Regular rate and rhythm, no murmurs / rubs / gallops. No lower extremity edema.  No JVD.  Peripheral pulses easily palpable. Abdomen: no tenderness, no masses palpated.  Bowel sounds positive.  Tolerating solid diet.  Neurologic: CN 2-12 grossly intact. Sensation intact,Strength 5/5 x all 4 extremities.  Ambulates independently Psychiatric: Alert and oriented x 3 although does not have appropriate insight or capacity to manage care independently 2/2 longstanding schizophrenia.  Pleasant mood.    Assessment/Plan:  Hyponatremia with seizures: Intravascular dehydration, resolved Held home diuretic diuretics OF torsemide, metolazone which were eventually discontinued.   Na+ had been stable between 131 and 133 but as of 8/9 down to 127 Weight increasing and in positive balance I/O therefore back some mild volume overload therefore we will treat additional doses of diuretics and follow electrolyte panel daily As of 8/10 sodium has increased slightly to 128 after initiation of diuretics therefore we will continue to treat heart failure as below  Lip edema/dental caries with abscess Resolved Patient developed low-grade fever and evolving upper lip swelling along with pain to upper gum, lips and frontal sinuses Orthopantogram revealed a lucency around the roots of right side posterior molar suggestive of possible infection Has completed IV Unasyn and Augmentin as of 8/2  Chronic congestive heart failure with reduced ejection fraction, 25%.-Associated right heart failure Euvolemic for several days but developed trace edema for was initiated on low-dose Lasix-sodium decreased slightly but otherwise has remained stable.   8/11 sodium stable and improving with the addition of Lasix. Likely will transition to once daily oral Lasix on 8/12 and increased from 40mg  to 10 mg I > O- currently not on fluid restriction but if increased dose of Lasix does not help manage volume status may need to institute fluid restriction Echo in May 2021-EF 25-30% Continue Coreg, Entresto.   Daily weights with strict I/O Digoxin level subtherapeutic at <0.02 therefore dose increased to 0.125 mg daily-repeat level 7/25 also subtherapeutic but no indication to continue to follow given utilization for heart failure physiology  only Counseled extensively by multiple providers regarding need to adhere to salt restricted diet  Depression, Suicidal ideations: Resolved, seen by psychiatry.  Recommended to continue BuSpar, Neurontin, Invega.   Follow-up outpatient psychiatry.   Received his routine Invega shot 7/8, takes this monthly-next dose due 8/8 On 8/5 patient was complaining of anxiety and restlessness and had requested his Invega shot early.  Explained to patient that this not possible but did begin Klonopin 0.5 mg every 12 hours prn.  This medication needs to be discontinued after patient receives Invega dose on 8/8  Seizure disorder: Stable Secondary to hyponatremia and issues of nonadherence with medications prior to admission.   Dilantin level subtherapeutic on 7/18 at 2.6 - Dilantin dosage increased to 200 mg twice daily-repeat level on 7/25 also subtherapeutic to therefore dosage increased at recommendation of pharmacy to 200 in the morning and 300 at bedtime Repeat Dilantin level 7/30 has increased but still remains low at 6.4-discussed with pharmacist and have increased a.m. dose from 200 to 300 mg; repeat Dilantin level 8.7 on 8/4- 8/11 level now therapeutic at 11.7 so cont current dose  Microcytic anemia Iron studies relatively normal, hemoglobin stable  OSA Continue nocturnal CPAP-since mask changed compliance has improved  COPD: stable. As needed bronchodilators  Hyperlipidemia: Daily Lipitor 40 mg  Hypokalemia/hypomagnesemia Replace as needed  Prolonged QTC:  Appears to have resolved.   Can you Seroquel 200 mg daily.  Peripheral neuropathy Change gabapentin 300 mg QID to Lyrica 50 mg TID Repeat B12 level  Modest obesity Estimated body mass index is 31.76 kg/m as calculated from the following:   Height as of this encounter: 5\' 8"  (1.727 m).  Weight as of this encounter: 94.8 kg.      Data Reviewed: Basic Metabolic Panel: Recent Labs  Lab 04/12/20 0414  04/13/20 0305 04/14/20 0449  NA 127* 128* 128*  K 4.0 3.8 4.0  CL 94* 94* 94*  CO2 27 24 24   GLUCOSE 96 112* 90  BUN 10 8 10   CREATININE 0.50* 0.52* 0.53*  CALCIUM 9.3 9.3 9.3   Liver Function Tests: No results for input(s): AST, ALT, ALKPHOS, BILITOT, PROT, ALBUMIN in the last 168 hours. No results for input(s): LIPASE, AMYLASE in the last 168 hours. No results for input(s): AMMONIA in the last 168 hours. CBC: No results for input(s): WBC, NEUTROABS, HGB, HCT, MCV, PLT in the last 168 hours. Cardiac Enzymes: No results for input(s): CKTOTAL, CKMB, CKMBINDEX, TROPONINI in the last 168 hours. BNP (last 3 results) Recent Labs    01/22/20 0410 02/24/20 0030  BNP 697.9* 184.7*    ProBNP (last 3 results) No results for input(s): PROBNP in the last 8760 hours.  CBG: No results for input(s): GLUCAP in the last 168 hours.  No results found for this or any previous visit (from the past 240 hour(s)).   Studies: No results found.  Scheduled Meds: . atorvastatin  40 mg Oral Daily  . busPIRone  10 mg Oral TID  . carvedilol  3.125 mg Oral BID WC  . digoxin  0.125 mg Oral Daily  . enoxaparin (LOVENOX) injection  40 mg Subcutaneous Daily  . fluticasone furoate-vilanterol  1 puff Inhalation Daily  . furosemide  40 mg Oral BID  . phenytoin  300 mg Oral BID  . pregabalin  50 mg Oral TID  . QUEtiapine  200 mg Oral Daily  . sacubitril-valsartan  1 tablet Oral BID   Continuous Infusions:   Principal Problem:   Hyponatremia Active Problems:   Chronic systolic CHF (congestive heart failure) (HCC)   Mixed hyperlipidemia   COPD (chronic obstructive pulmonary disease) (HCC)   Seizure disorder (HCC)   Paranoid schizophrenia (HCC)   Hypokalemia   Hypomagnesemia   Suicidal ideation   Noncompliance with medications   Subtherapeutic serum dilantin level   Seizure (HCC)        Consultants:  None  Procedures:  None  Antibiotics: Anti-infectives (From admission,  onward)   Start     Dose/Rate Route Frequency Ordered Stop   04/02/20 2200  amoxicillin-clavulanate (AUGMENTIN) 875-125 MG per tablet 1 tablet        1 tablet Oral Every 12 hours 04/02/20 1405 04/04/20 2130   03/29/20 0930  Ampicillin-Sulbactam (UNASYN) 3 g in sodium chloride 0.9 % 100 mL IVPB  Status:  Discontinued        3 g 200 mL/hr over 30 Minutes Intravenous Every 6 hours 03/29/20 0915 04/02/20 1405       Time spent: 15 minutes    03/31/20 ANP  [facility] Pager [phone]. If 7PM-7AM, please contact night-coverage at [email or web address] 04/14/2020, 1:06 PM  LOS: 51 days

## 2020-04-15 DIAGNOSIS — E871 Hypo-osmolality and hyponatremia: Secondary | ICD-10-CM | POA: Diagnosis not present

## 2020-04-15 LAB — BASIC METABOLIC PANEL
Anion gap: 9 (ref 5–15)
BUN: 8 mg/dL (ref 6–20)
CO2: 25 mmol/L (ref 22–32)
Calcium: 9.2 mg/dL (ref 8.9–10.3)
Chloride: 92 mmol/L — ABNORMAL LOW (ref 98–111)
Creatinine, Ser: 0.53 mg/dL — ABNORMAL LOW (ref 0.61–1.24)
GFR calc Af Amer: >60 mL/min (ref >60)
GFR calc non Af Amer: >60 mL/min (ref >60)
Glucose, Bld: 94 mg/dL (ref 70–99)
Potassium: 3.8 mmol/L (ref 3.5–5.1)
Sodium: 126 mmol/L — ABNORMAL LOW (ref 135–145)

## 2020-04-15 MED ORDER — FUROSEMIDE 40 MG PO TABS
60.0000 mg | ORAL_TABLET | Freq: Every day | ORAL | Status: DC
  Administered 2020-04-15: 60 mg via ORAL
  Filled 2020-04-15: qty 1

## 2020-04-15 NOTE — Progress Notes (Signed)
[facility] PROGRESS NOTE  Jim Dunn ZOX:[id number] DOB: 1966-07-17 DOA: 02/23/2020 PCP: System, Pcp Not In  Status: Inpatient---Remains inpatient appropriate because:Altered mental status and Unsafe d/c plan   Dispo: The patient is from: Home              Anticipated d/c is to: Group home              Anticipated d/c date is: > 3 days              Patient currently is not medically stable to d/c. **NEEDS SAFE DC PLAN** Currently awaiting placement, TOC team working on this.  Mother is currently working to obtain Medicaid/guardianship. Unsafe for discharge based on underlying schizophrenia and history of recurrent noncompliance which led to patient's current hospitalization  Discussed with LCSW/case management. Mother has applied for Medicaid/financial assistance through [facility] where patient previously resided.  Code Status: Full Family Communication: 7/28 updated [name] by telephone [phone] or [phone] DVT prophylaxis: 8/5 has been refusing Lovenox over the past several days therefore switch to SCDs since ambulating without difficulty Vaccination status: Vaccinated   HPI: 54 year old male with past medical history of schizophrenia, seizure disorder, biventricular heart failure(Echo 01/2020 EF 25-30%with right-sided heart failure),severe tricuspid regurgitation, nicotine dependence and COPD who presents to [facility] emergency department with suicidal ideation and seizure.Patient explains that since he left the hospital several days ago(admitted6/17-6/19-was being managed for hyponatremia, thought to be multifactorial in origin. Patient left AMA on6/19 and that time had sodium of 132),he has been experiencing increasing fatigue. This increasing fatigue is associated with bilateral lower extremity tingling. Symptomsweresimilar to what prompted his presentation on 6/17. Additionally, he reported feeling depressed withsuicidal  ideation, withouta plan. Patient reportedthat he hadattempted suicide in the distant past using pills but was unsuccessful and never sought medical attention. While the patient was waiting in the waiting room, he experienced a witnessed seizure while attempting to walk to the bathroom. Patient was immediately brought back to the emergency department for further evaluation. Patient was found to have substantial recurrent hyponatremia of 117 with concurrent hypokalemia of 2.6 and hypomagnesium of 1.4. Patient was administered 1236mg  of intravenous Fosphenytoinand initiated on low rate normal saline infusion. Patient was administered 2 g of intravenous magnesium and a 30 mEq of potassium.  Patient's electrolytes have improved, no further seizure episodes at this time.  Echo showed EF of 25-30%, cardiology recommended outpatient follow-up but otherwise continue Coreg, torsemide and Entresto.    Subjective: No mention regarding neuropathic pain Sitting up in chair ordering breakfast tray  Objective: Vitals:   04/15/20 0732 04/15/20 1132  BP: 110/88 103/62  Pulse: 74 88  Resp: 18 18  Temp: 98.3 F (36.8 C) 98.7 F (37.1 C)  SpO2: 98% 100%    Intake/Output Summary (Last 24 hours) at 04/15/2020 1231 Last data filed at 04/15/2020 0700 Gross per 24 hour  Intake 1412 ml  Output 1275 ml  Net 137 ml   Filed Weights   04/12/20 0701 04/13/20 0700 04/15/20 0345  Weight: 95.3 kg 94.8 kg 93.8 kg    Exam:  Constitutional: NAD, calm, comfortable HEENT: Extremely poor dentition   Respiratory: clear to auscultation bilaterally on posterior exam, Normal respiratory effort at rest.  Room air Cardiovascular: Regular rate, no murmurs / rubs / gallops. No lower extremity edema.  No JVD.  Peripheral pulses easily palpable. Abdomen: no tenderness, no masses palpated.  Bowel sounds positive.  Tolerating solid diet.   Neurologic: CN  2-12 grossly intact. Sensation intact,Strength 5/5 x all 4  extremities. Ambulates independently Psychiatric: Alert and oriented x 3 although does not have appropriate insight or capacity to manage care independently 2/2 longstanding schizophrenia.  Pleasant mood.    Assessment/Plan:  Hyponatremia with seizures: Intravascular dehydration, resolved Held home diuretic diuretics: torsemide, metolazone all of which were eventually discontinued.   Na+ had been stable between 131 and 133 but as of 8/9 down to 127 and further decreased to 126 initially went up to 28 but as of 8/12 after acute treatment for asymptomatic volume overload Weight had increased to 210.1 lbs and in positive balance I/O therefore associated decreased sodium suspected volume overload-below regarding treatment regimen  Chronic congestive heart failure with reduced ejection fraction, 25%.-Associated right heart failure Euvolemic for several days but developed trace edema for was initiated on low-dose Lasix-sodium decreased slightly but otherwise has remained stable.   8/12 sodium decreased to 126 but weight has also decreased from 210.1 lbs to 206.8 lbs therefore will decrease to daily Lasix but increase baseline dose to 60 mg daily I > O- currently not on fluid restriction but if increased dose of Lasix does not help manage volume status may need to institute fluid restriction Echo in May 2021-EF 25-30% Continue Coreg, Entresto, Daily weights with strict I/O Digoxin level subtherapeutic at <0.02 therefore dose increased to 0.125 mg daily-repeat level 7/25 also subtherapeutic but no indication to continue to follow given utilization for heart failure physiology only Counseled extensively by multiple providers regarding need to adhere to salt restricted diet  Lip edema/dental caries with abscess Resolved Patient developed low-grade fever and evolving upper lip swelling along with pain to upper gum, lips and frontal sinuses Orthopantogram revealed a lucency around the roots of right  side posterior molar suggestive of possible infection Has completed IV Unasyn and Augmentin as of 8/2  Depression, Suicidal ideations: Resolved, seen by psychiatry.  Recommended to continue BuSpar, Neurontin, Invega.   Follow-up outpatient psychiatry.   Received his routine Invega shot 7/8, takes this monthly-next dose due 8/8 On 8/5 patient was complaining of anxiety and restlessness and had requested his Invega shot early.  Explained to patient that this not possible but did begin Klonopin 0.5 mg every 12 hours prn.  This medication needs to be discontinued after patient receives Invega dose on 8/8  Seizure disorder: Stable Secondary to hyponatremia and issues of nonadherence with medications prior to admission.   Dilantin level subtherapeutic on 7/18 at 2.6 - Dilantin dosage increased to 200 mg twice daily-repeat level on 7/25 also subtherapeutic to therefore dosage increased at recommendation of pharmacy to 200 in the morning and 300 at bedtime Repeat Dilantin level 7/30 has increased but still remains low at 6.4-discussed with pharmacist and have increased a.m. dose from 200 to 300 mg; repeat Dilantin level 8.7 on 8/4- 8/11 level now therapeutic at 11.7 so cont current dose  Microcytic anemia Iron studies relatively normal, hemoglobin stable  OSA Continue nocturnal CPAP-since mask changed compliance has improved  COPD: stable. As needed bronchodilators  Hyperlipidemia: Daily Lipitor 40 mg  Hypokalemia/hypomagnesemia Replace as needed  Prolonged QTC:  Appears to have resolved.   Can you Seroquel 200 mg daily.  Peripheral neuropathy Change gabapentin 300 mg QID to Lyrica 50 mg TID Repeat B12 level  Modest obesity Estimated body mass index is 31.44 kg/m as calculated from the following:   Height as of this encounter: 5\' 8"  (1.727 m).   Weight as of this encounter: 93.8 kg.  Data Reviewed: Basic Metabolic Panel: Recent Labs  Lab 04/12/20 0414  04/13/20 0305 04/14/20 0449 04/15/20 0152  NA 127* 128* 128* 126*  K 4.0 3.8 4.0 3.8  CL 94* 94* 94* 92*  CO2 27 24 24 25   GLUCOSE 96 112* 90 94  BUN 10 8 10 8   CREATININE 0.50* 0.52* 0.53* 0.53*  CALCIUM 9.3 9.3 9.3 9.2   Liver Function Tests: No results for input(s): AST, ALT, ALKPHOS, BILITOT, PROT, ALBUMIN in the last 168 hours. No results for input(s): LIPASE, AMYLASE in the last 168 hours. No results for input(s): AMMONIA in the last 168 hours. CBC: No results for input(s): WBC, NEUTROABS, HGB, HCT, MCV, PLT in the last 168 hours. Cardiac Enzymes: No results for input(s): CKTOTAL, CKMB, CKMBINDEX, TROPONINI in the last 168 hours. BNP (last 3 results) Recent Labs    01/22/20 0410 02/24/20 0030  BNP 697.9* 184.7*    ProBNP (last 3 results) No results for input(s): PROBNP in the last 8760 hours.  CBG: No results for input(s): GLUCAP in the last 168 hours.  No results found for this or any previous visit (from the past 240 hour(s)).   Studies: No results found.  Scheduled Meds: . atorvastatin  40 mg Oral Daily  . busPIRone  10 mg Oral TID  . carvedilol  3.125 mg Oral BID WC  . digoxin  0.125 mg Oral Daily  . enoxaparin (LOVENOX) injection  40 mg Subcutaneous Daily  . fluticasone furoate-vilanterol  1 puff Inhalation Daily  . furosemide  60 mg Oral Daily  . phenytoin  300 mg Oral BID  . pregabalin  50 mg Oral TID  . QUEtiapine  200 mg Oral Daily  . sacubitril-valsartan  1 tablet Oral BID   Continuous Infusions:   Principal Problem:   Hyponatremia Active Problems:   Chronic systolic CHF (congestive heart failure) (HCC)   Mixed hyperlipidemia   COPD (chronic obstructive pulmonary disease) (HCC)   Seizure disorder (HCC)   Paranoid schizophrenia (HCC)   Hypokalemia   Hypomagnesemia   Suicidal ideation   Noncompliance with medications   Subtherapeutic serum dilantin level   Seizure  (HCC)        Consultants:  None  Procedures:  None  Antibiotics: Anti-infectives (From admission, onward)   Start     Dose/Rate Route Frequency Ordered Stop   04/02/20 2200  amoxicillin-clavulanate (AUGMENTIN) 875-125 MG per tablet 1 tablet        1 tablet Oral Every 12 hours 04/02/20 1405 04/04/20 2130   03/29/20 0930  Ampicillin-Sulbactam (UNASYN) 3 g in sodium chloride 0.9 % 100 mL IVPB  Status:  Discontinued        3 g 200 mL/hr over 30 Minutes Intravenous Every 6 hours 03/29/20 0915 04/02/20 1405       Time spent: 15 minutes    03/31/20 ANP  [facility] Pager [phone]. If 7PM-7AM, please contact night-coverage at [email or web address] 04/15/2020, 12:31 PM  LOS: 52 days

## 2020-04-16 DIAGNOSIS — E871 Hypo-osmolality and hyponatremia: Secondary | ICD-10-CM | POA: Diagnosis not present

## 2020-04-16 LAB — BASIC METABOLIC PANEL
Anion gap: 8 (ref 5–15)
BUN: 9 mg/dL (ref 6–20)
CO2: 24 mmol/L (ref 22–32)
Calcium: 8.9 mg/dL (ref 8.9–10.3)
Chloride: 90 mmol/L — ABNORMAL LOW (ref 98–111)
Creatinine, Ser: 0.58 mg/dL — ABNORMAL LOW (ref 0.61–1.24)
GFR calc Af Amer: >60 mL/min (ref >60)
GFR calc non Af Amer: >60 mL/min (ref >60)
Glucose, Bld: 88 mg/dL (ref 70–99)
Potassium: 3.9 mmol/L (ref 3.5–5.1)
Sodium: 122 mmol/L — ABNORMAL LOW (ref 135–145)

## 2020-04-16 MED ORDER — FUROSEMIDE 40 MG PO TABS
60.0000 mg | ORAL_TABLET | Freq: Every day | ORAL | Status: DC
  Administered 2020-04-18 – 2020-04-28 (×11): 60 mg via ORAL
  Filled 2020-04-16 (×12): qty 1

## 2020-04-16 NOTE — Progress Notes (Signed)
[facility] PROGRESS NOTE  Mourad Cwikla JJO:[id number] DOB: 1966/07/14 DOA: 02/23/2020 PCP: System, Pcp Not In  Status: Inpatient---Remains inpatient appropriate because:Altered mental status and Unsafe d/c plan   Dispo: The patient is from: Home              Anticipated d/c is to: Group home              Anticipated d/c date is: > 3 days              Patient currently is not medically stable to d/c. **NEEDS SAFE DC PLAN** Currently awaiting placement, TOC team working on this.  Mother is currently working to obtain Medicaid/guardianship. Unsafe for discharge based on underlying schizophrenia and history of recurrent noncompliance which led to patient's current hospitalization  Discussed with LCSW/case management. Mother has applied for Medicaid/financial assistance through [facility] where patient previously resided.  Code Status: Full Family Communication: 7/28 updated [name] by telephone [phone] or [phone] DVT prophylaxis: 8/5 refused Lovenox therefore switched to SCDs since ambulating without difficulty Vaccination status: Vaccinated   HPI: 54 year old male with past medical history of schizophrenia, seizure disorder, biventricular heart failure(Echo 01/2020 EF 25-30%with right-sided heart failure),severe tricuspid regurgitation, nicotine dependence and COPD who presents to [facility] emergency department with suicidal ideation and seizure.Patient explains that since he left the hospital several days ago(admitted6/17-6/19-was being managed for hyponatremia, thought to be multifactorial in origin. Patient left AMA on6/19 and that time had sodium of 132),he has been experiencing increasing fatigue. This increasing fatigue is associated with bilateral lower extremity tingling. Symptomsweresimilar to what prompted his presentation on 6/17. Additionally, he reported feeling depressed withsuicidal ideation, withouta plan. Patient  reportedthat he hadattempted suicide in the distant past using pills but was unsuccessful and never sought medical attention. While the patient was waiting in the waiting room, he experienced a witnessed seizure while attempting to walk to the bathroom. Patient was immediately brought back to the emergency department for further evaluation. Patient was found to have substantial recurrent hyponatremia of 117 with concurrent hypokalemia of 2.6 and hypomagnesium of 1.4. Patient was administered 1236mg  of intravenous Fosphenytoinand initiated on low rate normal saline infusion. Patient was administered 2 g of intravenous magnesium and a 30 mEq of potassium.  Patient's electrolytes have improved, no further seizure episodes at this time.  Echo showed EF of 25-30%, cardiology recommended outpatient follow-up but otherwise continue Coreg, torsemide and Entresto.    Subjective: Denies any neuropathic pain at this juncture Tolerating diet, no complaints of weakness or muscle spasms  Objective: Vitals:   04/16/20 0816 04/16/20 0827  BP: 95/80   Pulse: 97   Resp: 18   Temp: 97.8 F (36.6 C)   SpO2: 98% 93%    Intake/Output Summary (Last 24 hours) at 04/16/2020 1106 Last data filed at 04/16/2020 0830 Gross per 24 hour  Intake 1140 ml  Output --  Net 1140 ml   Filed Weights   04/12/20 0701 04/13/20 0700 04/15/20 0345  Weight: 95.3 kg 94.8 kg 93.8 kg    Exam:  Constitutional: NAD, calm, comfortable HEENT: Extremely poor dentition   Respiratory: clear to auscultation bilaterally on posterior exam, Normal respiratory effort at rest.  Room air Cardiovascular: Regular rate, no murmurs / rubs / gallops. No lower extremity edema.  No JVD.  Peripheral pulses easily palpable. Abdomen: no tenderness, no masses palpated.  Bowel sounds positive.  Tolerating solid diet.   Neurologic: CN 2-12 grossly intact. Sensation intact,Strength 5/5 x  all 4 extremities. Ambulates independently Psychiatric:  Alert and oriented x 3 although does not have appropriate insight or capacity to manage care independently 2/2 longstanding schizophrenia.  Pleasant mood.    Assessment/Plan:  Hyponatremia with seizures: Intravascular dehydration, resolved Held home diuretic diuretics: torsemide, metolazone all of which were eventually discontinued.   Na+ had been stable between 131 and 133 but as of 8/9 down to 127 and further decreased to 126 initially went up to 28 but as of 8/12 after acute treatment for asymptomatic volume overload Weight had increased to 210.1 lbs and in positive balance I/O therefore associated decreased sodium suspected volume overload-below regarding treatment regimen 8/13 after diuresis sodium has decreased to 122 therefore Lasix placed on hold for 48 hours with automatic resumption date 8/15 Continue daily electrolyte panel  Chronic congestive heart failure with reduced ejection fraction, 25%.-Associated right heart failure Euvolemic for several days but developed trace edema for was initiated on low-dose Lasix-sodium decreased slightly but otherwise has remained stable.   8/12 sodium decreased to 126 but weight has also decreased from 210.1 lbs to 206.8 lbs therefore will decrease to daily Lasix but increase baseline dose to 60 mg daily-unfortunately as of 8/13 sodium decreased further to 122 therefore Lasix has been placed on hold for 48 hours with automatic resumption planned for 8/15-continue to follow electrolytes daily I > O- currently not on fluid restriction but if increased dose of Lasix does not help manage volume status may need to institute fluid restriction Echo in May 2021-EF 25-30% Continue Coreg, Entresto, Daily weights with strict I/O Digoxin level subtherapeutic at <0.02 therefore dose increased to 0.125 mg daily-repeat level 7/25 also subtherapeutic but no indication to continue to follow given utilization for heart failure physiology only Counseled extensively by  multiple providers regarding need to adhere to salt restricted diet   Lip edema/dental caries with abscess Resolved Patient developed low-grade fever and evolving upper lip swelling along with pain to upper gum, lips and frontal sinuses Orthopantogram revealed a lucency around the roots of right side posterior molar suggestive of possible infection Has completed IV Unasyn and Augmentin as of 8/2  Depression, Suicidal ideations: Resolved, seen by psychiatry.  Recommended to continue BuSpar, Neurontin, Invega.   Follow-up outpatient psychiatry.   Received his routine Invega shot 7/8, takes this monthly-next dose due 8/8 On 8/5 patient was complaining of anxiety and restlessness and had requested his Invega shot early.  Explained to patient that this not possible but did begin Klonopin 0.5 mg every 12 hours prn.  This medication needs to be discontinued after patient receives Invega dose on 8/8  Seizure disorder: Stable Secondary to hyponatremia and issues of nonadherence with medications prior to admission.   Dilantin level subtherapeutic on 7/18 at 2.6 - Dilantin dosage increased to 200 mg twice daily-repeat level on 7/25 also subtherapeutic to therefore dosage increased at recommendation of pharmacy to 200 in the morning and 300 at bedtime Repeat Dilantin level 7/30 has increased but still remains low at 6.4-discussed with pharmacist and have increased a.m. dose from 200 to 300 mg; repeat Dilantin level 8.7 on 8/4- 8/11 level now therapeutic at 11.7 so cont current dose  Microcytic anemia Iron studies relatively normal, hemoglobin stable  OSA Continue nocturnal CPAP-since mask changed compliance has improved  COPD: stable. As needed bronchodilators  Hyperlipidemia: Daily Lipitor 40 mg  Hypokalemia/hypomagnesemia Replace as needed  Prolonged QTC:  Appears to have resolved.   Can you Seroquel 200 mg daily.  Peripheral neuropathy Change  gabapentin 300 mg QID to Lyrica 50  mg TID Repeat B12 level  Modest obesity Estimated body mass index is 31.44 kg/m as calculated from the following:   Height as of this encounter: 5\' 8"  (1.727 m).   Weight as of this encounter: 93.8 kg.      Data Reviewed: Basic Metabolic Panel: Recent Labs  Lab 04/12/20 0414 04/13/20 0305 04/14/20 0449 04/15/20 0152 04/16/20 0154  NA 127* 128* 128* 126* 122*  K 4.0 3.8 4.0 3.8 3.9  CL 94* 94* 94* 92* 90*  CO2 27 24 24 25 24   GLUCOSE 96 112* 90 94 88  BUN 10 8 10 8 9   CREATININE 0.50* 0.52* 0.53* 0.53* 0.58*  CALCIUM 9.3 9.3 9.3 9.2 8.9   Liver Function Tests: No results for input(s): AST, ALT, ALKPHOS, BILITOT, PROT, ALBUMIN in the last 168 hours. No results for input(s): LIPASE, AMYLASE in the last 168 hours. No results for input(s): AMMONIA in the last 168 hours. CBC: No results for input(s): WBC, NEUTROABS, HGB, HCT, MCV, PLT in the last 168 hours. Cardiac Enzymes: No results for input(s): CKTOTAL, CKMB, CKMBINDEX, TROPONINI in the last 168 hours. BNP (last 3 results) Recent Labs    01/22/20 0410 02/24/20 0030  BNP 697.9* 184.7*    ProBNP (last 3 results) No results for input(s): PROBNP in the last 8760 hours.  CBG: No results for input(s): GLUCAP in the last 168 hours.  No results found for this or any previous visit (from the past 240 hour(s)).   Studies: No results found.  Scheduled Meds: . atorvastatin  40 mg Oral Daily  . busPIRone  10 mg Oral TID  . carvedilol  3.125 mg Oral BID WC  . digoxin  0.125 mg Oral Daily  . enoxaparin (LOVENOX) injection  40 mg Subcutaneous Daily  . fluticasone furoate-vilanterol  1 puff Inhalation Daily  . [START ON 04/18/2020] furosemide  60 mg Oral Daily  . phenytoin  300 mg Oral BID  . pregabalin  50 mg Oral TID  . QUEtiapine  200 mg Oral Daily  . sacubitril-valsartan  1 tablet Oral BID   Continuous Infusions:   Principal Problem:   Hyponatremia Active Problems:   Chronic systolic CHF (congestive heart  failure) (HCC)   Mixed hyperlipidemia   COPD (chronic obstructive pulmonary disease) (HCC)   Seizure disorder (HCC)   Paranoid schizophrenia (HCC)   Hypokalemia   Hypomagnesemia   Suicidal ideation   Noncompliance with medications   Subtherapeutic serum dilantin level   Seizure (HCC)        Consultants:  None  Procedures:  None  Antibiotics: Anti-infectives (From admission, onward)   Start     Dose/Rate Route Frequency Ordered Stop   04/02/20 2200  amoxicillin-clavulanate (AUGMENTIN) 875-125 MG per tablet 1 tablet        1 tablet Oral Every 12 hours 04/02/20 1405 04/04/20 2130   03/29/20 0930  Ampicillin-Sulbactam (UNASYN) 3 g in sodium chloride 0.9 % 100 mL IVPB  Status:  Discontinued        3 g 200 mL/hr over 30 Minutes Intravenous Every 6 hours 03/29/20 0915 04/02/20 1405       Time spent: 20 minutes    03/31/20 ANP  [facility] Pager [phone]. If 7PM-7AM, please contact night-coverage at [email or web address] 04/16/2020, 11:06 AM  LOS: 53 days

## 2020-04-17 DIAGNOSIS — E876 Hypokalemia: Secondary | ICD-10-CM | POA: Diagnosis not present

## 2020-04-17 DIAGNOSIS — J410 Simple chronic bronchitis: Secondary | ICD-10-CM | POA: Diagnosis not present

## 2020-04-17 DIAGNOSIS — I5022 Chronic systolic (congestive) heart failure: Secondary | ICD-10-CM | POA: Diagnosis not present

## 2020-04-17 DIAGNOSIS — E871 Hypo-osmolality and hyponatremia: Secondary | ICD-10-CM | POA: Diagnosis not present

## 2020-04-17 LAB — BASIC METABOLIC PANEL
Anion gap: 9 (ref 5–15)
BUN: 5 mg/dL — ABNORMAL LOW (ref 6–20)
CO2: 20 mmol/L — ABNORMAL LOW (ref 22–32)
Calcium: 9.2 mg/dL (ref 8.9–10.3)
Chloride: 97 mmol/L — ABNORMAL LOW (ref 98–111)
Creatinine, Ser: 0.4 mg/dL — ABNORMAL LOW (ref 0.61–1.24)
GFR calc Af Amer: >60 mL/min (ref >60)
GFR calc non Af Amer: >60 mL/min (ref >60)
Glucose, Bld: 88 mg/dL (ref 70–99)
Potassium: 4.2 mmol/L (ref 3.5–5.1)
Sodium: 126 mmol/L — ABNORMAL LOW (ref 135–145)

## 2020-04-17 MED ORDER — CLONAZEPAM 0.25 MG PO TBDP
0.2500 mg | ORAL_TABLET | Freq: Every day | ORAL | Status: DC
  Administered 2020-04-17 – 2020-05-04 (×18): 0.25 mg via ORAL
  Filled 2020-04-17 (×18): qty 1

## 2020-04-17 NOTE — Progress Notes (Signed)
PROGRESS NOTE    Jim Dunn  WPY:[id number] DOB: November 30, 1965 DOA: 02/23/2020 PCP: System, Pcp Not In    Chief Complaint  Patient presents with  . Medical Clearance    Brief Narrative: Male prior history of schizophrenia, seizure disorder, heart failure, COPD presents to ED with suicidal ideations and seizures.  Assessment & Plan:   Principal Problem:   Hyponatremia Active Problems:   Chronic systolic CHF (congestive heart failure) (HCC)   Mixed hyperlipidemia   COPD (chronic obstructive pulmonary disease) (HCC)   Seizure disorder (HCC)   Paranoid schizophrenia (HCC)   Hypokalemia   Hypomagnesemia   Suicidal ideation   Noncompliance with medications   Subtherapeutic serum dilantin level   Seizure (HCC)   Chronic systolic heart failure Patient appears to be compensated at this time.  No pedal edema, patient is on room air with good oxygen saturation.  Last echocardiogram from May 2021 showed left ventricular ejection fraction of 25 to 30%.  Recommend to continue with Coreg, Entresto and digoxin. Continue with strict intake and output and daily weights. Hold Lasix for hyponatremia.   Hyponatremia Initially  thought to be secondary to dehydration and intravascular depletion. Sodium had improved from 110 to 131 slowly, but now with lasix , sodium gradually decreased to 122,. Lasix has been held.  Pt currently denies any dizziness,nausea, headache or vomiting.  Sodium has improved to 126 today.  Continue to monitor.    Dental caries with abscess Resolved. Orthopantogram revealed a lucency around the roots of the right side posterior molar suggestive of a possible infection and he completed the course of antibiotics by 04/05/2020.   Depression, suicidal ideation Resolved Continue with BuSpar, Neurontin and Invega.   Seizures Possibly secondary to noncompliance to medications. Dilantin was subtherapeutic on admission currently at subtherapeutic level.   Microcytic  anemia Hemoglobin has been stable.   Peripheral neuropathy Continue with gabapentin and Lyrica.   Hypokalemia Replaced.   Prolonged QTC Appears to have resolved.    Hyperlipidemia continue with the Lipitor.    COPD Stable, no wheezing heard on exam today, continue with bronchodilators as needed.   Obstructive sleep apnea Continue with CPAP at night.  Body mass index is 31.41 kg/m. Obesity    DVT prophylaxis: scd's Code Status: full code.  Family Communication: none at bedside.  Disposition:   Status is: Inpatient  Remains inpatient appropriate because:Unsafe d/c plan   Dispo: The patient is from: Home              Anticipated d/c is to: Group home              Anticipated d/c date is: > 3 days              Patient currently is not medically stable to d/c.       Consultants:   None.    Procedures: none.   Antimicrobials: none.   Subjective: Complaining of anxiety, requesting for anti anxiety medications.   Objective: Vitals:   04/17/20 0752 04/17/20 0753 04/17/20 0834 04/17/20 1131  BP: (!) 141/80 (!) 141/80  128/84  Pulse: 74 74  76  Resp: 16 16  20   Temp: 99.2 F (37.3 C) 99.2 F (37.3 C)  98.4 F (36.9 C)  TempSrc:  Oral  Oral  SpO2: 100% 100% 100% 98%  Weight:      Height:        Intake/Output Summary (Last 24 hours) at 04/17/2020 1408 Last data filed at 04/16/2020 2009 Gross per  24 hour  Intake 240 ml  Output 400 ml  Net -160 ml   Filed Weights   04/13/20 0700 04/15/20 0345 04/17/20 0420  Weight: 94.8 kg 93.8 kg 93.7 kg    Examination:  General exam: Appears calm and comfortable  Respiratory system: Clear to auscultation. Respiratory effort normal. Cardiovascular system: S1 & S2 heard, RRR. No JVD, No pedal edema. Gastrointestinal system: Abdomen is nondistended, soft and nontender.  Normal bowel sounds heard. Central nervous system: Alert and oriented. No focal neurological deficits. Extremities: Symmetric 5 x 5  power. Skin: No rashes, lesions or ulcers Psychiatry:  Mood & affect appropriate.     Data Reviewed: I have personally reviewed following labs and imaging studies  CBC: No results for input(s): WBC, NEUTROABS, HGB, HCT, MCV, PLT in the last 168 hours.  Basic Metabolic Panel: Recent Labs  Lab 04/13/20 0305 04/14/20 0449 04/15/20 0152 04/16/20 0154 04/17/20 1316  NA 128* 128* 126* 122* 126*  K 3.8 4.0 3.8 3.9 4.2  CL 94* 94* 92* 90* 97*  CO2 24 24 25 24  20*  GLUCOSE 112* 90 94 88 88  BUN 8 10 8 9  5*  CREATININE 0.52* 0.53* 0.53* 0.58* 0.40*  CALCIUM 9.3 9.3 9.2 8.9 9.2    GFR: Estimated Creatinine Clearance: 119.9 mL/min (A) (by C-G formula based on SCr of 0.4 mg/dL (L)).  Liver Function Tests: No results for input(s): AST, ALT, ALKPHOS, BILITOT, PROT, ALBUMIN in the last 168 hours.  CBG: No results for input(s): GLUCAP in the last 168 hours.   No results found for this or any previous visit (from the past 240 hour(s)).       Radiology Studies: No results found.      Scheduled Meds: . atorvastatin  40 mg Oral Daily  . busPIRone  10 mg Oral TID  . carvedilol  3.125 mg Oral BID WC  . clonazePAM  0.25 mg Oral Daily  . digoxin  0.125 mg Oral Daily  . enoxaparin (LOVENOX) injection  40 mg Subcutaneous Daily  . fluticasone furoate-vilanterol  1 puff Inhalation Daily  . [START ON 04/18/2020] furosemide  60 mg Oral Daily  . phenytoin  300 mg Oral BID  . pregabalin  50 mg Oral TID  . QUEtiapine  200 mg Oral Daily  . sacubitril-valsartan  1 tablet Oral BID   Continuous Infusions:   LOS: 54 days        , MD [facility]   To contact the attending provider between 7A-7P or the covering provider during after hours 7P-7A, please log into the web site [email or web address] and access using universal [place] password for that web site. If you do not have the password, please call the hospital operator.  04/17/2020, 2:08 PM

## 2020-04-18 DIAGNOSIS — E871 Hypo-osmolality and hyponatremia: Secondary | ICD-10-CM | POA: Diagnosis not present

## 2020-04-18 DIAGNOSIS — J410 Simple chronic bronchitis: Secondary | ICD-10-CM | POA: Diagnosis not present

## 2020-04-18 DIAGNOSIS — I5022 Chronic systolic (congestive) heart failure: Secondary | ICD-10-CM | POA: Diagnosis not present

## 2020-04-18 LAB — SODIUM: Sodium: 130 mmol/L — ABNORMAL LOW (ref 135–145)

## 2020-04-18 NOTE — Progress Notes (Addendum)
PROGRESS NOTE    Jim Dunn  KGM:[id number] DOB: 1966-05-30 DOA: 02/23/2020 PCP: System, Pcp Not In    Chief Complaint  Patient presents with  . Medical Clearance    Brief Narrative: Male prior history of schizophrenia, seizure disorder, heart failure, COPD presents to ED with suicidal ideations and seizures. Pt seen and examined at bedside, no new complaints. Sodium improved to 130.    Assessment & Plan:   Principal Problem:   Hyponatremia Active Problems:   Chronic systolic CHF (congestive heart failure) (HCC)   Mixed hyperlipidemia   COPD (chronic obstructive pulmonary disease) (HCC)   Seizure disorder (HCC)   Paranoid schizophrenia (HCC)   Hypokalemia   Hypomagnesemia   Suicidal ideation   Noncompliance with medications   Subtherapeutic serum dilantin level   Seizure (HCC)   Chronic systolic heart failure Patient appears to be compensated at this time.  No pedal edema, patient is on room air with good oxygen saturation.  Last echocardiogram from May 2021 showed left ventricular ejection fraction of 25 to 30%.  Recommend to continue with Coreg, Entresto and digoxin. Continue with strict intake and output and daily weights. Restarted the lasix today. Sodium has improved to 130    Hyponatremia Initially  thought to be secondary to dehydration and intravascular depletion. Sodium had improved from 110 to 131 slowly, but now with lasix , sodium gradually decreased to 122,.  Pt currently denies any dizziness,nausea, headache or vomiting.  Sodium has improved to 130.  Continue to monitor.    Dental caries with abscess Resolved. Orthopantogram revealed a lucency around the roots of the right side posterior molar suggestive of a possible infection and he completed the course of antibiotics by 04/05/2020.   Depression, suicidal ideation Resolved Continue with BuSpar, Neurontin and Invega.   Seizures Possibly secondary to noncompliance to medications. Dilantin was  subtherapeutic on admission currently at subtherapeutic level.   Microcytic anemia Hemoglobin has been stable.   Peripheral neuropathy Continue with gabapentin and Lyrica.   Hypokalemia Replaced.   Prolonged QTC Appears to have resolved.    Hyperlipidemia continue with the Lipitor.    COPD Stable, no wheezing heard on exam today, continue with bronchodilators as needed.   Obstructive sleep apnea Continue with CPAP at night.  Body mass index is 31.41 kg/m. Obesity    DVT prophylaxis: scd's Code Status: full code.  Family Communication: none at bedside.  Disposition:   Status is: Inpatient  Remains inpatient appropriate because:Unsafe d/c plan   Dispo: The patient is from: Home              Anticipated d/c is to: Group home              Anticipated d/c date is: > 3 days              Patient currently is not medically stable to d/c.       Consultants:   None.    Procedures: none.   Antimicrobials: none.   Subjective: No new complaints today.   Objective: Vitals:   04/18/20 0333 04/18/20 0800 04/18/20 0906 04/18/20 1633  BP: 107/75 (!) 135/99  114/78  Pulse: 88 71  100  Resp: 18   20  Temp: 98.5 F (36.9 C) (!) 97.5 F (36.4 C)  98 F (36.7 C)  TempSrc: Oral Oral    SpO2: 94% 100% 100% 99%  Weight:      Height:        Intake/Output Summary (Last 24  hours) at 04/18/2020 1701 Last data filed at 04/18/2020 0630 Gross per 24 hour  Intake 600 ml  Output 650 ml  Net -50 ml   Filed Weights   04/13/20 0700 04/15/20 0345 04/17/20 0420  Weight: 94.8 kg 93.8 kg 93.7 kg    Examination:  General exam: alert and comfortable.  Respiratory system: Clear to auscultation bilaterally, no wheezing or rhonchi. Cardiovascular system: S1-S2 heard, regular rate rhythm, no JVD, no pedal edema. Gastrointestinal system: Abdomen is soft, nontender, nondistended bowel sounds normal. Central nervous system: Alert oriented, no focal deficits at this  time. Extremities: No cyanosis or clubbing. Skin: No rashes  Psychiatry: mood  is appropriate.     Data Reviewed: I have personally reviewed following labs and imaging studies  CBC: No results for input(s): WBC, NEUTROABS, HGB, HCT, MCV, PLT in the last 168 hours.  Basic Metabolic Panel: Recent Labs  Lab 04/13/20 0305 04/13/20 0305 04/14/20 0449 04/15/20 0152 04/16/20 0154 04/17/20 1316 04/18/20 0957  NA 128*   < > 128* 126* 122* 126* 130*  K 3.8  --  4.0 3.8 3.9 4.2  --   CL 94*  --  94* 92* 90* 97*  --   CO2 24  --  24 25 24  20*  --   GLUCOSE 112*  --  90 94 88 88  --   BUN 8  --  10 8 9  5*  --   CREATININE 0.52*  --  0.53* 0.53* 0.58* 0.40*  --   CALCIUM 9.3  --  9.3 9.2 8.9 9.2  --    < > = values in this interval not displayed.    GFR: Estimated Creatinine Clearance: 119.9 mL/min (A) (by C-G formula based on SCr of 0.4 mg/dL (L)).  Liver Function Tests: No results for input(s): AST, ALT, ALKPHOS, BILITOT, PROT, ALBUMIN in the last 168 hours.  CBG: No results for input(s): GLUCAP in the last 168 hours.   No results found for this or any previous visit (from the past 240 hour(s)).       Radiology Studies: No results found.      Scheduled Meds: . atorvastatin  40 mg Oral Daily  . busPIRone  10 mg Oral TID  . carvedilol  3.125 mg Oral BID WC  . clonazePAM  0.25 mg Oral Daily  . digoxin  0.125 mg Oral Daily  . enoxaparin (LOVENOX) injection  40 mg Subcutaneous Daily  . fluticasone furoate-vilanterol  1 puff Inhalation Daily  . furosemide  60 mg Oral Daily  . phenytoin  300 mg Oral BID  . pregabalin  50 mg Oral TID  . QUEtiapine  200 mg Oral Daily  . sacubitril-valsartan  1 tablet Oral BID   Continuous Infusions:   LOS: 55 days        , MD [facility]   To contact the attending provider between 7A-7P or the covering provider during after hours 7P-7A, please log into the web site [email or web address] and access using  universal [place] password for that web site. If you do not have the password, please call the hospital operator.  04/18/2020, 5:01 PM

## 2020-04-19 DIAGNOSIS — E871 Hypo-osmolality and hyponatremia: Secondary | ICD-10-CM | POA: Diagnosis not present

## 2020-04-19 NOTE — Progress Notes (Signed)
[facility] PROGRESS NOTE  Jim Dunn RCV:[id number] DOB: [birth year] DOA: 02/23/2020 PCP: System, Pcp Not In  Status: Inpatient---Remains inpatient appropriate because:Altered mental status and Unsafe d/c plan   Dispo: The patient is from: Home              Anticipated d/c is to: Group home              Anticipated d/c date is: > 3 days              Patient currently is not medically stable to d/c. **NEEDS SAFE DC PLAN** Currently awaiting placement, TOC team working on this.  Mother is currently working to obtain Medicaid/guardianship. Unsafe for discharge based on underlying schizophrenia and history of recurrent noncompliance which led to patient's current hospitalization  Discussed with LCSW/case management. Mother has applied for Medicaid/financial assistance through [facility] where patient previously resided.  Code Status: Full Family Communication: 7/28 updated [name] by telephone [phone] or [phone] DVT prophylaxis: 8/5 refused Lovenox therefore switched to SCDs since ambulating without difficulty Vaccination status: Vaccinated   HPI: 54 year old male with past medical history of schizophrenia, seizure disorder, biventricular heart failure(Echo 01/2020 EF 25-30%with right-sided heart failure),severe tricuspid regurgitation, nicotine dependence and COPD who presents to [facility] emergency department with suicidal ideation and seizure.Patient explains that since he left the hospital several days ago(admitted6/17-6/19-was being managed for hyponatremia, thought to be multifactorial in origin. Patient left AMA on6/19 and that time had sodium of 132),he has been experiencing increasing fatigue. This increasing fatigue is associated with bilateral lower extremity tingling. Symptomsweresimilar to what prompted his presentation on 6/17. Additionally, he reported feeling depressed withsuicidal ideation, withouta plan. Patient  reportedthat he hadattempted suicide in the distant past using pills but was unsuccessful and never sought medical attention. While the patient was waiting in the waiting room, he experienced a witnessed seizure while attempting to walk to the bathroom. Patient was immediately brought back to the emergency department for further evaluation. Patient was found to have substantial recurrent hyponatremia of 117 with concurrent hypokalemia of 2.6 and hypomagnesium of 1.4. Patient was administered 1236mg  of intravenous Fosphenytoinand initiated on low rate normal saline infusion. Patient was administered 2 g of intravenous magnesium and a 30 mEq of potassium.  Patient's electrolytes have improved, no further seizure episodes at this time.  Echo showed EF of 25-30%, cardiology recommended outpatient follow-up but otherwise continue Coreg, torsemide and Entresto.    Subjective: Sitting up in chair.  No complaints.  Enjoying reading his books.  Objective: Vitals:   04/19/20 0749 04/19/20 0817  BP: 126/69   Pulse: 68   Resp: 16   Temp: 98.2 F (36.8 C)   SpO2: 100% 96%    Intake/Output Summary (Last 24 hours) at 04/19/2020 1137 Last data filed at 04/18/2020 2300 Gross per 24 hour  Intake 480 ml  Output 400 ml  Net 80 ml   Filed Weights   04/13/20 0700 04/15/20 0345 04/17/20 0420  Weight: 94.8 kg 93.8 kg 93.7 kg    Exam:  Constitutional: NAD, calm, comfortable HEENT: Extremely poor dentition   Respiratory: clear to auscultation bilaterally on posterior exam, Normal respiratory effort at rest.  Room air Cardiovascular: Regular rate, no murmurs / rubs / gallops. No lower extremity edema.  No JVD.  Peripheral pulses easily palpable. Abdomen: no tenderness, no masses palpated.  Bowel sounds positive.  Tolerating solid diet.   Neurologic: CN 2-12 grossly intact. Sensation intact,Strength 5/5 x all 4 extremities.  Ambulates independently Psychiatric: Alert and oriented x 3 although does not  have appropriate insight or capacity to manage care independently 2/2 longstanding schizophrenia.  Pleasant mood.    Assessment/Plan:  Hyponatremia with seizures: Intravascular dehydration, resolved At time of admission diuretics torsemide & metolazone were held and subsequently  discontinued.   Na+ had been stable between 131 and 133 but as of 8/9 down to 127 and further decreased to 126 initially went up to 28 but as of 8/12 after acute treatment for asymptomatic volume overload Weight had increased to 210.1 lbs and in positive balance I/O therefore associated decreased sodium suspected volume overload-below regarding treatment regimen 8/13 after diuresis sodium has decreased to 122 therefore Lasix placed on hold for 48 hours with automatic resumption date 8/15 with follow-up sodium 8/16 of 130 Continue daily electrolyte panel  Chronic congestive heart failure with reduced ejection fraction, 25%.-Associated right heart failure.   Recently treated for 2 days with twice daily Lasix dosing in context of asymptomatic heart failure exacerbation/weight gain.  Sodium subsequently decreased to 126 and weight decreased from 210.1 lbs to 206.8 lbs-Lasix decreased to daily but baseline dosage increased from 40 mg to 60 mg -see above regarding sodium Echo May 2021-EF 25-30% Continue Coreg, Entresto, Daily weights with strict I/O Digoxin level subtherapeutic at <0.02 therefore dose increased to 0.125 mg daily-repeat level 7/25 also subtherapeutic but no indication to continue to follow given utilization for heart failure physiology only Counseled extensively by multiple providers regarding need to adhere to salt restricted diet   Lip edema/dental caries with abscess Resolved Patient developed low-grade fever and evolving upper lip swelling along with pain to upper gum, lips and frontal sinuses Orthopantogram revealed a lucency around the roots of right side posterior molar suggestive of possible  infection Has completed IV Unasyn and Augmentin as of 8/2  Depression, Suicidal ideations: Resolved, seen by psychiatry.  Recommended to continue BuSpar, Neurontin, Invega.   Follow-up outpatient psychiatry.   Received his routine Invega shot 8/8, takes this monthly-next dose due 9/8  Seizure disorder: Stable Secondary to hyponatremia and issues of nonadherence with medications prior to admission.   Dilantin level subtherapeutic on 7/18 at 2.6 - Dilantin dosage increased to 200 mg twice daily-repeat level on 7/25 also subtherapeutic to therefore dosage increased at recommendation of pharmacy to 200 in the morning and 300 at bedtime Repeat Dilantin level 7/30 has increased but still remains low at 6.4-discussed with pharmacist and have increased a.m. dose from 200 to 300 mg; repeat Dilantin level 8.7 on 8/4- 8/11 level now therapeutic at 11.7 so cont current dose  Microcytic anemia Iron studies relatively normal, hemoglobin stable  OSA Continue nocturnal CPAP-since mask changed compliance has improved  COPD: stable. As needed bronchodilators  Hyperlipidemia: Daily Lipitor 40 mg  Hypokalemia/hypomagnesemia Replace as needed  Prolonged QTC:  Appears to have resolved.   Can you Seroquel 200 mg daily.  Peripheral neuropathy Change gabapentin 300 mg QID to Lyrica 50 mg TID Repeat B12 level  Modest obesity Estimated body mass index is 31.41 kg/m as calculated from the following:   Height as of this encounter: 5\' 8"  (1.727 m).   Weight as of this encounter: 93.7 kg.      Data Reviewed: Basic Metabolic Panel: Recent Labs  Lab 04/13/20 0305 04/13/20 0305 04/14/20 0449 04/15/20 0152 04/16/20 0154 04/17/20 1316 04/18/20 0957  NA 128*   < > 128* 126* 122* 126* 130*  K 3.8  --  4.0 3.8 3.9 4.2  --  CL 94*  --  94* 92* 90* 97*  --   CO2 24  --  24 25 24  20*  --   GLUCOSE 112*  --  90 94 88 88  --   BUN 8  --  10 8 9  5*  --   CREATININE 0.52*  --  0.53*  0.53* 0.58* 0.40*  --   CALCIUM 9.3  --  9.3 9.2 8.9 9.2  --    < > = values in this interval not displayed.   Liver Function Tests: No results for input(s): AST, ALT, ALKPHOS, BILITOT, PROT, ALBUMIN in the last 168 hours. No results for input(s): LIPASE, AMYLASE in the last 168 hours. No results for input(s): AMMONIA in the last 168 hours. CBC: No results for input(s): WBC, NEUTROABS, HGB, HCT, MCV, PLT in the last 168 hours. Cardiac Enzymes: No results for input(s): CKTOTAL, CKMB, CKMBINDEX, TROPONINI in the last 168 hours. BNP (last 3 results) Recent Labs    01/22/20 0410 02/24/20 0030  BNP 697.9* 184.7*    ProBNP (last 3 results) No results for input(s): PROBNP in the last 8760 hours.  CBG: No results for input(s): GLUCAP in the last 168 hours.  No results found for this or any previous visit (from the past 240 hour(s)).   Studies: No results found.  Scheduled Meds: . atorvastatin  40 mg Oral Daily  . busPIRone  10 mg Oral TID  . carvedilol  3.125 mg Oral BID WC  . clonazePAM  0.25 mg Oral Daily  . digoxin  0.125 mg Oral Daily  . enoxaparin (LOVENOX) injection  40 mg Subcutaneous Daily  . fluticasone furoate-vilanterol  1 puff Inhalation Daily  . furosemide  60 mg Oral Daily  . phenytoin  300 mg Oral BID  . pregabalin  50 mg Oral TID  . QUEtiapine  200 mg Oral Daily  . sacubitril-valsartan  1 tablet Oral BID   Continuous Infusions:   Principal Problem:   Hyponatremia Active Problems:   Chronic systolic CHF (congestive heart failure) (HCC)   Mixed hyperlipidemia   COPD (chronic obstructive pulmonary disease) (HCC)   Seizure disorder (HCC)   Paranoid schizophrenia (HCC)   Hypokalemia   Hypomagnesemia   Suicidal ideation   Noncompliance with medications   Subtherapeutic serum dilantin level   Seizure (HCC)        Consultants:  None  Procedures:  None  Antibiotics: Anti-infectives (From admission, onward)   Start     Dose/Rate Route  Frequency Ordered Stop   04/02/20 2200  amoxicillin-clavulanate (AUGMENTIN) 875-125 MG per tablet 1 tablet        1 tablet Oral Every 12 hours 04/02/20 1405 04/04/20 2130   03/29/20 0930  Ampicillin-Sulbactam (UNASYN) 3 g in sodium chloride 0.9 % 100 mL IVPB  Status:  Discontinued        3 g 200 mL/hr over 30 Minutes Intravenous Every 6 hours 03/29/20 0915 04/02/20 1405       Time spent: 15 minutes    03/31/20 ANP  [facility] Pager [phone]. If 7PM-7AM, please contact night-coverage at [email or web address] 04/19/2020, 11:37 AM  LOS: 56 days

## 2020-04-19 NOTE — Progress Notes (Signed)
Pt refused cpap. RT told him to have the nurse call if he decides he wants the cpap tonight.

## 2020-04-19 NOTE — Progress Notes (Signed)
Placed patient on CPAP for HS use via FFM, 8.0 cn H20 (previous settings) 21%. Tolerating well at this time. RN will continue to monitor.

## 2020-04-20 LAB — PHENYTOIN LEVEL, TOTAL: Phenytoin Lvl: 13.3 ug/mL (ref 10.0–20.0)

## 2020-04-20 NOTE — TOC Progression Note (Signed)
Transition of Care [facility]) - Progression Note    Patient Details  Name: Quanta Roher MRN: [id number] Date of Birth: [birth year]  Transition of Care [facility]) CM/SW [place], [place] Phone Number: 04/20/2020, 4:17 PM  Clinical Narrative:   CSW met with patient's mother at bedside, she is working on [employer] application. Mother needs FL2 for Medicaid application. CSW printed and provided for application process.  No group home available at this time. CSW to follow.    Expected Discharge Plan: Assisted Living Barriers to Discharge: Continued Medical Work up, Unsafe home situation  Expected Discharge Plan and Services Expected Discharge Plan: Assisted Living       Living arrangements for the past 2 months: Single Family Home                                       Social Determinants of Health (SDOH) Interventions    Readmission Risk Interventions Readmission Risk Prevention Plan 01/23/2020  Transportation Screening Complete  HRI or Home Care Consult Complete  Social Work Consult for [place] Planning/Counseling [place] Not Applicable  Medication Review Press photographer) (No Data)

## 2020-04-20 NOTE — Progress Notes (Signed)
[facility] PROGRESS NOTE  Jim Dunn DGL:[id number] DOB: 12/24/1965 DOA: 02/23/2020 PCP: System, Pcp Not In  Status: Inpatient---Remains inpatient appropriate because:Altered mental status and Unsafe d/c plan   Dispo: The patient is from: Home              Anticipated d/c is to: Group home              Anticipated d/c date is: > 3 days              Patient currently is not medically stable to d/c. **NEEDS SAFE DC PLAN** Currently awaiting placement, TOC team working on this.  Mother is currently working to obtain Medicaid/guardianship. Unsafe for discharge based on underlying schizophrenia and history of recurrent noncompliance which led to patient's current hospitalization  Discussed with LCSW/case management. Mother has applied for Medicaid/financial assistance through [facility] where patient previously resided.  Code Status: Full Family Communication: 8/17 updated [name] by telephone HIPPA appropriate VM left [ [phone] or [phone]] DVT prophylaxis: 8/5 refused Lovenox therefore switched to SCDs since ambulating without difficulty Vaccination status: Vaccinated   HPI: 54 year old male with past medical history of schizophrenia, seizure disorder, biventricular heart failure(Echo 01/2020 EF 25-30%with right-sided heart failure),severe tricuspid regurgitation, nicotine dependence and COPD who presents to [facility] emergency department with suicidal ideation and seizure.Patient explains that since he left the hospital several days ago(admitted6/17-6/19-was being managed for hyponatremia, thought to be multifactorial in origin. Patient left AMA on6/19 and that time had sodium of 132),he has been experiencing increasing fatigue. This increasing fatigue is associated with bilateral lower extremity tingling. Symptomsweresimilar to what prompted his presentation on 6/17. Additionally, he reported feeling depressed withsuicidal ideation,  withouta plan. Patient reportedthat he hadattempted suicide in the distant past using pills but was unsuccessful and never sought medical attention. While the patient was waiting in the waiting room, he experienced a witnessed seizure while attempting to walk to the bathroom. Patient was immediately brought back to the emergency department for further evaluation. Patient was found to have substantial recurrent hyponatremia of 117 with concurrent hypokalemia of 2.6 and hypomagnesium of 1.4. Patient was administered 1236mg  of intravenous Fosphenytoinand initiated on low rate normal saline infusion. Patient was administered 2 g of intravenous magnesium and a 30 mEq of potassium.  Patient's electrolytes have improved, no further seizure episodes at this time.  Echo showed EF of 25-30%, cardiology recommended outpatient follow-up but otherwise continue Coreg, torsemide and Entresto.    Subjective: Sitting up in chair.  Currently reading his Bible.  Denies chest pain or shortness of breath.  Objective: Vitals:   04/20/20 0754 04/20/20 1104  BP: (!) 147/91   Pulse: 67 88  Resp: 16   Temp: 97.6 F (36.4 C)   SpO2: 99%     Intake/Output Summary (Last 24 hours) at 04/20/2020 1158 Last data filed at 04/20/2020 0944 Gross per 24 hour  Intake 240 ml  Output --  Net 240 ml   Filed Weights   04/15/20 0345 04/17/20 0420 04/20/20 0411  Weight: 93.8 kg 93.7 kg 94.4 kg    Exam:  Constitutional: NAD, calm, comfortable HEENT: Extremely poor dentition   Respiratory: clear to auscultation bilaterally on posterior exam, Normal respiratory effort at rest.  Room air Cardiovascular: Regular rate, no murmurs / rubs / gallops. No lower extremity edema.  No JVD.  Abdomen: no tenderness, no masses palpated.  Bowel sounds positive.  Tolerating solid diet.   Neurologic: CN 2-12 grossly intact. Sensation  intact,Strength 5/5 x all 4 extremities. Ambulates independently Psychiatric: Alert and oriented x 3  although does not have appropriate insight or capacity to manage care independently 2/2 longstanding schizophrenia.  Pleasant mood.    Assessment/Plan:  Hyponatremia with seizures: Intravascular dehydration, resolved At time of admission diuretics torsemide & metolazone were held and subsequently  discontinued.   As of 8/9 Na+ down to 127 and further decreased to 126 initially went up to 28 but as of 8/12 after acute treatment for asymptomatic volume overload Weight had increased to 210.1 lbs and in positive balance I/O therefore associated decreased sodium suspected volume overload-below regarding treatment regimen 8/13 after diuresis sodium has decreased to 122 therefore Lasix placed on hold for 48 hours with automatic resumption date 8/15 with follow-up sodium 8/16 of 130  Chronic congestive heart failure with reduced ejection fraction, 25%.-Associated right heart failure.   Recently treated for 2 days with twice daily Lasix dosing in context of asymptomatic heart failure exacerbation/weight gain.  Sodium subsequently decreased to 126 and weight decreased from 210.1 lbs to 206.8 lbs-Lasix decreased to daily but baseline dosage increased from 40 mg to 60 mg -see above regarding sodium Echo May 2021-EF 25-30% Continue Coreg, Entresto, Daily weights with strict I/O Counseled extensively by multiple providers regarding need to adhere to salt restricted diet   Lip edema/dental caries with abscess Resolved Patient developed low-grade fever and evolving upper lip swelling along with pain to upper gum, lips and frontal sinuses Orthopantogram revealed a lucency around the roots of right side posterior molar suggestive of infection Has completed IV Unasyn and Augmentin as of 8/2  Depression, Suicidal ideations: Resolved, seen by psychiatry.  Recommended to continue BuSpar, Neurontin, Invega.   Follow-up outpatient psychiatry.   Received his routine Invega shot 8/8, takes this monthly-next dose  due 9/8  Seizure disorder: Stable Secondary to hyponatremia and issues of nonadherence with medications prior to admission.   Dilantin level subtherapeutic on 7/18 at 2.6 - Dilantin dosage increased to 200 mg twice daily-repeat level on 7/25 also subtherapeutic to therefore dosage increased at recommendation of pharmacy to 200 in the morning and 300 at bedtime Repeat Dilantin level 7/30 has increased but still remains low at 6.4-discussed with pharmacist and have increased a.m. dose from 200 to 300 mg; repeat Dilantin level 8.7 on 8/4- 8/11 level now therapeutic at 11.7 so cont current dose  Microcytic anemia Iron studies relatively normal, hemoglobin stable  OSA Continue nocturnal CPAP-since mask changed compliance has improved  COPD: stable. As needed bronchodilators  Hyperlipidemia: Daily Lipitor 40 mg  Hypokalemia/hypomagnesemia Replace as needed  Prolonged QTC:  Appears to have resolved.   Can you Seroquel 200 mg daily.  Peripheral neuropathy Change gabapentin 300 mg QID to Lyrica 50 mg TID Repeat B12 level  Modest obesity Estimated body mass index is 31.64 kg/m as calculated from the following:   Height as of this encounter: 5\' 8"  (1.727 m).   Weight as of this encounter: 94.4 kg.      Data Reviewed: Basic Metabolic Panel: Recent Labs  Lab 04/14/20 0449 04/15/20 0152 04/16/20 0154 04/17/20 1316 04/18/20 0957  NA 128* 126* 122* 126* 130*  K 4.0 3.8 3.9 4.2  --   CL 94* 92* 90* 97*  --   CO2 24 25 24  20*  --   GLUCOSE 90 94 88 88  --   BUN 10 8 9  5*  --   CREATININE 0.53* 0.53* 0.58* 0.40*  --   CALCIUM 9.3 9.2  8.9 9.2  --    Liver Function Tests: No results for input(s): AST, ALT, ALKPHOS, BILITOT, PROT, ALBUMIN in the last 168 hours. No results for input(s): LIPASE, AMYLASE in the last 168 hours. No results for input(s): AMMONIA in the last 168 hours. CBC: No results for input(s): WBC, NEUTROABS, HGB, HCT, MCV, PLT in the last 168  hours. Cardiac Enzymes: No results for input(s): CKTOTAL, CKMB, CKMBINDEX, TROPONINI in the last 168 hours. BNP (last 3 results) Recent Labs    01/22/20 0410 02/24/20 0030  BNP 697.9* 184.7*    ProBNP (last 3 results) No results for input(s): PROBNP in the last 8760 hours.  CBG: No results for input(s): GLUCAP in the last 168 hours.  No results found for this or any previous visit (from the past 240 hour(s)).   Studies: No results found.  Scheduled Meds: . atorvastatin  40 mg Oral Daily  . busPIRone  10 mg Oral TID  . carvedilol  3.125 mg Oral BID WC  . clonazePAM  0.25 mg Oral Daily  . digoxin  0.125 mg Oral Daily  . enoxaparin (LOVENOX) injection  40 mg Subcutaneous Daily  . fluticasone furoate-vilanterol  1 puff Inhalation Daily  . furosemide  60 mg Oral Daily  . phenytoin  300 mg Oral BID  . pregabalin  50 mg Oral TID  . QUEtiapine  200 mg Oral Daily  . sacubitril-valsartan  1 tablet Oral BID   Continuous Infusions:   Principal Problem:   Hyponatremia Active Problems:   Chronic systolic CHF (congestive heart failure) (HCC)   Mixed hyperlipidemia   COPD (chronic obstructive pulmonary disease) (HCC)   Seizure disorder (HCC)   Paranoid schizophrenia (HCC)   Hypokalemia   Hypomagnesemia   Suicidal ideation   Noncompliance with medications   Subtherapeutic serum dilantin level   Seizure (HCC)        Consultants:  None  Procedures:  None  Antibiotics: Anti-infectives (From admission, onward)   Start     Dose/Rate Route Frequency Ordered Stop   04/02/20 2200  amoxicillin-clavulanate (AUGMENTIN) 875-125 MG per tablet 1 tablet        1 tablet Oral Every 12 hours 04/02/20 1405 04/04/20 2130   03/29/20 0930  Ampicillin-Sulbactam (UNASYN) 3 g in sodium chloride 0.9 % 100 mL IVPB  Status:  Discontinued        3 g 200 mL/hr over 30 Minutes Intravenous Every 6 hours 03/29/20 0915 04/02/20 1405       Time spent: 15 minutes    Junious Silk  ANP  [facility] Pager [phone]. If 7PM-7AM, please contact night-coverage at [email or web address] 04/20/2020, 11:58 AM  LOS: 57 days

## 2020-04-20 NOTE — [place] FL2 (Signed)
[place] MEDICAID FL2 LEVEL OF CARE SCREENING TOOL     IDENTIFICATION  Patient Name: Jim Dunn Birthdate: 1966-03-05 Sex: male Admission Date (Current Location): 02/23/2020  [facility] and [place] Number:   Programme researcher, broadcasting/film/video)   Facility and Address:  [facility] [address] [place], [place] [address]      Provider Number: [id number]  Attending Physician Name and Address:  Kathlen Mody, MD  Relative Name and Phone Number:       Current Level of Care: Hospital Recommended Level of Care: Other (Comment) (group home) Prior Approval Number:    Date Approved/Denied:   PASRR Number:    Discharge Plan: Other (Comment) (memory care)    Current Diagnoses: Patient Active Problem List   Diagnosis Date Noted  . Noncompliance with medications   . Subtherapeutic serum dilantin level   . Seizure (HCC)   . Hypomagnesemia 02/23/2020  . Suicidal ideation 02/23/2020  . Hyponatremia 02/19/2020  . Paranoid schizophrenia (HCC)   . Hypokalemia   . Chronic systolic CHF (congestive heart failure) (HCC) 01/22/2020  . Mixed hyperlipidemia 01/22/2020  . COPD (chronic obstructive pulmonary disease) (HCC) 01/22/2020  . Microcytic anemia 01/22/2020  . Chronic hyponatremia 01/22/2020  . Seizure disorder (HCC) 01/22/2020    Orientation RESPIRATION BLADDER Height & Weight     Self, Time, Situation, Place  Normal Continent Weight: 208 lb 1.6 oz (94.4 kg) Height:  5\' 8"  (172.7 cm)  BEHAVIORAL SYMPTOMS/MOOD NEUROLOGICAL BOWEL NUTRITION STATUS      Continent Diet (heart healthy)  AMBULATORY STATUS COMMUNICATION OF NEEDS Skin   Supervision Verbally Normal                       Personal Care Assistance Level of Assistance  Bathing, Feeding, Dressing Bathing Assistance: Limited assistance Feeding assistance: Independent Dressing Assistance: Limited assistance     Functional Limitations Info             SPECIAL CARE FACTORS FREQUENCY                        Contractures Contractures Info: Not present    Additional Factors Info  Code Status, Allergies, Psychotropic Code Status Info: Full Allergies Info: NKA Psychotropic Info: Buspar 10mg  3x/day; Seroquel 200mg  daily; Klonopin .25mg  daily         Current Medications (04/20/2020):  This is the current hospital active medication list Current Facility-Administered Medications  Medication Dose Route Frequency Provider Last Rate Last Admin  . acetaminophen (TYLENOL) tablet 650 mg  650 mg Oral Q4H PRN , MD   650 mg at 04/18/20 2228  . atorvastatin (LIPITOR) tablet 40 mg  40 mg Oral Daily Shalhoub, 04/22/2020, MD   40 mg at 04/20/20 1103  . busPIRone (BUSPAR) tablet 10 mg  10 mg Oral TID 04/20/20, MD   10 mg at 04/20/20 1103  . carvedilol (COREG) tablet 3.125 mg  3.125 mg Oral BID WC Shalhoub, Deno Lunger, MD   3.125 mg at 04/20/20 0819  . clonazePAM (KLONOPIN) disintegrating tablet 0.25 mg  0.25 mg Oral Daily Marinda Elk, MD   0.25 mg at 04/20/20 1104  . digoxin (LANOXIN) tablet 0.125 mg  0.125 mg Oral Daily Shalhoub, Deno Lunger, MD   0.125 mg at 04/20/20 1104  . enoxaparin (LOVENOX) injection 40 mg  40 mg Subcutaneous Daily Shalhoub, Kathlen Mody, MD   40 mg at 04/15/20 0939  . fluticasone furoate-vilanterol (BREO ELLIPTA)  200-25 MCG/INH 1 puff  1 puff Inhalation Daily Shalhoub, Deno Lunger, MD   1 puff at 04/20/20 0753  . furosemide (LASIX) tablet 60 mg  60 mg Oral Daily Russella Dar, NP   60 mg at 04/20/20 1104  . ipratropium-albuterol (DUONEB) 0.5-2.5 (3) MG/3ML nebulizer solution 3 mL  3 mL Nebulization Q4H PRN Shalhoub, Deno Lunger, MD      . ondansetron [facility]) tablet 4 mg  4 mg Oral Q6H PRN Shalhoub, Deno Lunger, MD       Or  . ondansetron [facility]) injection 4 mg  4 mg Intravenous Q6H PRN Shalhoub, Deno Lunger, MD      . phenytoin (DILANTIN) ER capsule 300 mg  300 mg Oral BID Russella Dar, NP   300 mg at 04/20/20 1104  . polyethylene glycol (MIRALAX / GLYCOLAX) packet 17  g  17 g Oral Daily PRN Marinda Elk, MD   17 g at 04/02/20 2102  . pregabalin (LYRICA) capsule 50 mg  50 mg Oral TID Russella Dar, NP   50 mg at 04/20/20 1103  . QUEtiapine (SEROQUEL) tablet 200 mg  200 mg Oral Daily Burnadette Pop, MD   200 mg at 04/20/20 1104  . sacubitril-valsartan (ENTRESTO) 24-26 mg per tablet  1 tablet Oral BID Shalhoub, Deno Lunger, MD   1 tablet at 04/20/20 1103  . zolpidem (AMBIEN) tablet 5 mg  5 mg Oral QHS PRN Edsel Petrin, DO   5 mg at 04/19/20 2201     Discharge Medications: Please see discharge summary for a list of discharge medications.  Relevant Imaging Results:  Relevant Lab Results:   Additional Information SS#: [id number]  Baldemar Lenis, LCSW

## 2020-04-21 NOTE — Progress Notes (Signed)
[facility] PROGRESS NOTE  Jim Dunn HOO:[id number] DOB: 02/15/1966 DOA: 02/23/2020 PCP: System, Pcp Not In  Status: Inpatient---Remains inpatient appropriate because:Altered mental status and Unsafe d/c plan   Dispo: The patient is from: Home              Anticipated d/c is to: Group home              Anticipated d/c date is: > 3 days              Patient currently is not medically stable to d/c. **NEEDS SAFE DC PLAN** Currently awaiting placement, TOC team working on this.  Mother is currently working to obtain Medicaid/guardianship. Unsafe for discharge based on underlying schizophrenia and history of recurrent noncompliance which led to patient's current hospitalization  Discussed with LCSW/case management. Mother presented to hospital on 8/17 to work with patient regarding Medicaid application.  She also obtained a completed FL2 form which was requested by [facility].  Code Status: Full Family Communication: 8/17 updated [name] by telephone HIPPA appropriate VM left [ [phone] or [phone]] DVT prophylaxis: 8/5 refused Lovenox therefore switched to SCDs since ambulating without difficulty Vaccination status: Vaccinated   HPI: 54 year old male with past medical history of schizophrenia, seizure disorder, biventricular heart failure(Echo 01/2020 EF 25-30%with right-sided heart failure),severe tricuspid regurgitation, nicotine dependence and COPD who presents to [facility] emergency department with suicidal ideation and seizure.Patient explains that since he left the hospital several days ago(admitted6/17-6/19-was being managed for hyponatremia, thought to be multifactorial in origin. Patient left AMA on6/19 and that time had sodium of 132),he has been experiencing increasing fatigue. This increasing fatigue is associated with bilateral lower extremity tingling. Symptomsweresimilar to what prompted his presentation on 6/17. Additionally,  he reported feeling depressed withsuicidal ideation, withouta plan. Patient reportedthat he hadattempted suicide in the distant past using pills but was unsuccessful and never sought medical attention. While the patient was waiting in the waiting room, he experienced a witnessed seizure while attempting to walk to the bathroom. Patient was immediately brought back to the emergency department for further evaluation. Patient was found to have substantial recurrent hyponatremia of 117 with concurrent hypokalemia of 2.6 and hypomagnesium of 1.4. Patient was administered 1236mg  of intravenous Fosphenytoinand initiated on low rate normal saline infusion. Patient was administered 2 g of intravenous magnesium and a 30 mEq of potassium.  Patient's electrolytes have improved, no further seizure episodes at this time.  Echo showed EF of 25-30%, cardiology recommended outpatient follow-up but otherwise continue Coreg, torsemide and Entresto.    Subjective: Sitting in chair beside bed.  No complaints verbalized.  RN at bedside stating patient with good urinary output  Objective: Vitals:   04/21/20 0851 04/21/20 1144  BP: (!) 135/93 90/61  Pulse: 76 87  Resp: 16 18  Temp: 98.4 F (36.9 C) (!) 97.5 F (36.4 C)  SpO2: 99% 99%    Intake/Output Summary (Last 24 hours) at 04/21/2020 1343 Last data filed at 04/21/2020 04/23/2020 Gross per 24 hour  Intake 1000 ml  Output 2000 ml  Net -1000 ml   Filed Weights   04/15/20 0345 04/17/20 0420 04/20/20 0411  Weight: 93.8 kg 93.7 kg 94.4 kg    Exam:  Constitutional: NAD, calm, comfortable Respiratory: clear to auscultation bilaterally on posterior exam, Normal respiratory effort at rest.  Room air Cardiovascular: Regular rate, no murmurs / rubs / gallops. No lower extremity edema.  No JVD.  Abdomen: no tenderness, Bowel sounds positive.  Tolerating solid diet.   Neurologic: CN 2-12 grossly intact. Sensation intact,Strength 5/5 x all 4 extremities.  Ambulates independently Psychiatric: Alert and oriented x 3 although does not have appropriate insight or capacity to manage care independently 2/2 longstanding schizophrenia.  Pleasant mood.    Assessment/Plan:  Hyponatremia with seizures: Intravascular dehydration, resolved At time of admission diuretics torsemide & metolazone were held and subsequently  discontinued.   As of 8/9 Na+ down to 127 and further decreased to 126 initially went up to 28 but as of 8/12 after acute treatment for asymptomatic volume overload Weight had increased to 210.1 lbs and in positive balance I/O therefore associated decreased sodium suspected volume overload-below regarding treatment regimen 8/13 after diuresis sodium has decreased to 122 therefore Lasix placed on hold for 48 hours with automatic resumption date 8/15 with follow-up sodium 8/16 of 130  Chronic congestive heart failure with reduced ejection fraction, 25%.-Associated right heart failure.   Recently treated for 2 days with twice daily Lasix dosing in context of asymptomatic heart failure exacerbation/weight gain. Na+ subsequently decreased to 126 and weight decreased from 210.1 lbs to 206.8 lbs-Lasix decreased to daily but baseline dosage increased from 40 mg to 60 mg -see above regarding sodium Echo May 2021-EF 25-30% Continue Coreg, Entresto, Daily weights with strict I/O Counseled extensively by multiple providers regarding need to adhere to salt restricted diet   Lip edema/dental caries with abscess Resolved Patient developed low-grade fever and evolving upper lip swelling along with pain to upper gum, lips and frontal sinuses Orthopantogram revealed a lucency around the roots of right side posterior molar suggestive of infection Completed IV Unasyn >> Augmentin 8/2  Depression, Suicidal ideations: Resolved, seen by psychiatry.  Recommended to continue BuSpar, Neurontin, Invega.   Follow-up outpatient psychiatry.   Received his routine  Invega shot 8/8, takes this monthly-next dose due 9/8  Seizure disorder: Stable Secondary to hyponatremia and issues of nonadherence with medications prior to admission.   Dilantin level subtherapeutic on 7/18 at 2.6 - Dilantin dosage increased to 200 mg twice daily-repeat level on 7/25 also subtherapeutic to therefore dosage increased at recommendation of pharmacy to 200 in the morning and 300 at bedtime Repeat Dilantin level 7/30 has increased but still remains low at 6.4-discussed with pharmacist and have increased a.m. dose from 200 to 300 mg; repeat Dilantin level 8.7 on 8/4- 8/11 level now therapeutic at 11.7 so cont current dose  Microcytic anemia Iron studies relatively normal, hemoglobin stable  OSA Continue nocturnal CPAP-since mask changed compliance has improved  COPD: stable. As needed bronchodilators  Hyperlipidemia: Daily Lipitor 40 mg  Hypokalemia/hypomagnesemia Replace as needed  Prolonged QTC:  Appears to have resolved.   Can you Seroquel 200 mg daily.  Peripheral neuropathy Change gabapentin 300 mg QID to Lyrica 50 mg TID Repeat B12 level  Modest obesity Estimated body mass index is 31.64 kg/m as calculated from the following:   Height as of this encounter: 5\' 8"  (1.727 m).   Weight as of this encounter: 94.4 kg.      Data Reviewed: Basic Metabolic Panel: Recent Labs  Lab 04/15/20 0152 04/16/20 0154 04/17/20 1316 04/18/20 0957  NA 126* 122* 126* 130*  K 3.8 3.9 4.2  --   CL 92* 90* 97*  --   CO2 25 24 20*  --   GLUCOSE 94 88 88  --   BUN 8 9 5*  --   CREATININE 0.53* 0.58* 0.40*  --   CALCIUM 9.2 8.9 9.2  --  Liver Function Tests: No results for input(s): AST, ALT, ALKPHOS, BILITOT, PROT, ALBUMIN in the last 168 hours. No results for input(s): LIPASE, AMYLASE in the last 168 hours. No results for input(s): AMMONIA in the last 168 hours. CBC: No results for input(s): WBC, NEUTROABS, HGB, HCT, MCV, PLT in the last 168  hours. Cardiac Enzymes: No results for input(s): CKTOTAL, CKMB, CKMBINDEX, TROPONINI in the last 168 hours. BNP (last 3 results) Recent Labs    01/22/20 0410 02/24/20 0030  BNP 697.9* 184.7*    ProBNP (last 3 results) No results for input(s): PROBNP in the last 8760 hours.  CBG: No results for input(s): GLUCAP in the last 168 hours.  No results found for this or any previous visit (from the past 240 hour(s)).   Studies: No results found.  Scheduled Meds: . atorvastatin  40 mg Oral Daily  . busPIRone  10 mg Oral TID  . carvedilol  3.125 mg Oral BID WC  . clonazePAM  0.25 mg Oral Daily  . digoxin  0.125 mg Oral Daily  . enoxaparin (LOVENOX) injection  40 mg Subcutaneous Daily  . fluticasone furoate-vilanterol  1 puff Inhalation Daily  . furosemide  60 mg Oral Daily  . phenytoin  300 mg Oral BID  . pregabalin  50 mg Oral TID  . QUEtiapine  200 mg Oral Daily  . sacubitril-valsartan  1 tablet Oral BID   Continuous Infusions:   Principal Problem:   Hyponatremia Active Problems:   Chronic systolic CHF (congestive heart failure) (HCC)   Mixed hyperlipidemia   COPD (chronic obstructive pulmonary disease) (HCC)   Seizure disorder (HCC)   Paranoid schizophrenia (HCC)   Hypokalemia   Hypomagnesemia   Suicidal ideation   Noncompliance with medications   Subtherapeutic serum dilantin level   Seizure (HCC)        Consultants:  None  Procedures:  None  Antibiotics: Anti-infectives (From admission, onward)   Start     Dose/Rate Route Frequency Ordered Stop   04/02/20 2200  amoxicillin-clavulanate (AUGMENTIN) 875-125 MG per tablet 1 tablet        1 tablet Oral Every 12 hours 04/02/20 1405 04/04/20 2130   03/29/20 0930  Ampicillin-Sulbactam (UNASYN) 3 g in sodium chloride 0.9 % 100 mL IVPB  Status:  Discontinued        3 g 200 mL/hr over 30 Minutes Intravenous Every 6 hours 03/29/20 0915 04/02/20 1405       Time spent: 15 minutes    Junious Silk  ANP  [facility] Pager [phone]. If 7PM-7AM, please contact night-coverage at [email or web address] 04/21/2020, 1:43 PM  LOS: 58 days

## 2020-04-21 NOTE — Progress Notes (Signed)
[facility] PROGRESS NOTE  Jim Dunn LKG:[id number] DOB: 08-09-1966 DOA: 02/23/2020 PCP: System, Pcp Not In  Status: Inpatient---Remains inpatient appropriate because:Altered mental status and Unsafe d/c plan   Dispo: The patient is from: Home              Anticipated d/c is to: Group home              Anticipated d/c date is: > 3 days              Patient currently is not medically stable to d/c. **NEEDS SAFE DC PLAN** Currently awaiting placement, TOC team working on this.  Mother is currently working to obtain Medicaid/guardianship. Unsafe for discharge based on underlying schizophrenia and history of recurrent noncompliance which led to patient's current hospitalization  Discussed with LCSW/case management. Mother has applied for Medicaid/financial assistance through [facility] where patient previously resided.  Code Status: Full Family Communication: 8/17 updated [name] by telephone HIPPA appropriate VM left [ [phone] or [phone]] DVT prophylaxis: 8/5 refused Lovenox therefore switched to SCDs since ambulating without difficulty Vaccination status: Vaccinated   HPI: 54 year old male with past medical history of schizophrenia, seizure disorder, biventricular heart failure(Echo 01/2020 EF 25-30%with right-sided heart failure),severe tricuspid regurgitation, nicotine dependence and COPD who presents to [facility] emergency department with suicidal ideation and seizure.Patient explains that since he left the hospital several days ago(admitted6/17-6/19-was being managed for hyponatremia, thought to be multifactorial in origin. Patient left AMA on6/19 and that time had sodium of 132),he has been experiencing increasing fatigue. This increasing fatigue is associated with bilateral lower extremity tingling. Symptomsweresimilar to what prompted his presentation on 6/17. Additionally, he reported feeling depressed withsuicidal ideation,  withouta plan. Patient reportedthat he hadattempted suicide in the distant past using pills but was unsuccessful and never sought medical attention. While the patient was waiting in the waiting room, he experienced a witnessed seizure while attempting to walk to the bathroom. Patient was immediately brought back to the emergency department for further evaluation. Patient was found to have substantial recurrent hyponatremia of 117 with concurrent hypokalemia of 2.6 and hypomagnesium of 1.4. Patient was administered 1236mg  of intravenous Fosphenytoinand initiated on low rate normal saline infusion. Patient was administered 2 g of intravenous magnesium and a 30 mEq of potassium.  Patient's electrolytes have improved, no further seizure episodes at this time.  Echo showed EF of 25-30%, cardiology recommended outpatient follow-up but otherwise continue Coreg, torsemide and Entresto.    Subjective: Sitting up in chair.  Currently reading his Bible.  Denies chest pain or shortness of breath.  Objective: Vitals:   04/21/20 0851 04/21/20 1144  BP: (!) 135/93 90/61  Pulse: 76 87  Resp: 16 18  Temp: 98.4 F (36.9 C) (!) 97.5 F (36.4 C)  SpO2: 99% 99%    Intake/Output Summary (Last 24 hours) at 04/21/2020 1306 Last data filed at 04/21/2020 04/23/2020 Gross per 24 hour  Intake 1000 ml  Output 2000 ml  Net -1000 ml   Filed Weights   04/15/20 0345 04/17/20 0420 04/20/20 0411  Weight: 93.8 kg 93.7 kg 94.4 kg    Exam:  Constitutional: NAD, calm, comfortable HEENT: Extremely poor dentition   Respiratory: clear to auscultation bilaterally on posterior exam, Normal respiratory effort at rest.  Room air Cardiovascular: Regular rate, no murmurs / rubs / gallops. No lower extremity edema.  No JVD.  Abdomen: no tenderness, no masses palpated.  Bowel sounds positive.  Tolerating solid diet.   Neurologic:  CN 2-12 grossly intact. Sensation intact,Strength 5/5 x all 4 extremities. Ambulates  independently Psychiatric: Alert and oriented x 3 although does not have appropriate insight or capacity to manage care independently 2/2 longstanding schizophrenia.  Pleasant mood.    Assessment/Plan:  Hyponatremia with seizures: Intravascular dehydration, resolved At time of admission diuretics torsemide & metolazone were held and subsequently  discontinued.   As of 8/9 Na+ down to 127 and further decreased to 126 initially went up to 28 but as of 8/12 after acute treatment for asymptomatic volume overload Weight had increased to 210.1 lbs and in positive balance I/O therefore associated decreased sodium suspected volume overload-below regarding treatment regimen 8/13 after diuresis sodium has decreased to 122 therefore Lasix placed on hold for 48 hours with automatic resumption date 8/15 with follow-up sodium 8/16 of 130  Chronic congestive heart failure with reduced ejection fraction, 25%.-Associated right heart failure.   Recently treated for 2 days with twice daily Lasix dosing in context of asymptomatic heart failure exacerbation/weight gain.  Sodium subsequently decreased to 126 and weight decreased from 210.1 lbs to 206.8 lbs-Lasix decreased to daily but baseline dosage increased from 40 mg to 60 mg -see above regarding sodium Echo May 2021-EF 25-30% Continue Coreg, Entresto, Daily weights with strict I/O Counseled extensively by multiple providers regarding need to adhere to salt restricted diet   Lip edema/dental caries with abscess Resolved Patient developed low-grade fever and evolving upper lip swelling along with pain to upper gum, lips and frontal sinuses Orthopantogram revealed a lucency around the roots of right side posterior molar suggestive of infection Has completed IV Unasyn and Augmentin as of 8/2  Depression, Suicidal ideations: Resolved, seen by psychiatry.  Recommended to continue BuSpar, Neurontin, Invega.   Follow-up outpatient psychiatry.   Received his  routine Invega shot 8/8, takes this monthly-next dose due 9/8  Seizure disorder: Stable Secondary to hyponatremia and issues of nonadherence with medications prior to admission.   Dilantin level subtherapeutic on 7/18 at 2.6 - Dilantin dosage increased to 200 mg twice daily-repeat level on 7/25 also subtherapeutic to therefore dosage increased at recommendation of pharmacy to 200 in the morning and 300 at bedtime Repeat Dilantin level 7/30 has increased but still remains low at 6.4-discussed with pharmacist and have increased a.m. dose from 200 to 300 mg; repeat Dilantin level 8.7 on 8/4- 8/11 level now therapeutic at 11.7 so cont current dose  Microcytic anemia Iron studies relatively normal, hemoglobin stable  OSA Continue nocturnal CPAP-since mask changed compliance has improved  COPD: stable. As needed bronchodilators  Hyperlipidemia: Daily Lipitor 40 mg  Hypokalemia/hypomagnesemia Replace as needed  Prolonged QTC:  Appears to have resolved.   Can you Seroquel 200 mg daily.  Peripheral neuropathy Change gabapentin 300 mg QID to Lyrica 50 mg TID Repeat B12 level  Modest obesity Estimated body mass index is 31.64 kg/m as calculated from the following:   Height as of this encounter: 5\' 8"  (1.727 m).   Weight as of this encounter: 94.4 kg.      Data Reviewed: Basic Metabolic Panel: Recent Labs  Lab 04/15/20 0152 04/16/20 0154 04/17/20 1316 04/18/20 0957  NA 126* 122* 126* 130*  K 3.8 3.9 4.2  --   CL 92* 90* 97*  --   CO2 25 24 20*  --   GLUCOSE 94 88 88  --   BUN 8 9 5*  --   CREATININE 0.53* 0.58* 0.40*  --   CALCIUM 9.2 8.9 9.2  --  Liver Function Tests: No results for input(s): AST, ALT, ALKPHOS, BILITOT, PROT, ALBUMIN in the last 168 hours. No results for input(s): LIPASE, AMYLASE in the last 168 hours. No results for input(s): AMMONIA in the last 168 hours. CBC: No results for input(s): WBC, NEUTROABS, HGB, HCT, MCV, PLT in the last 168  hours. Cardiac Enzymes: No results for input(s): CKTOTAL, CKMB, CKMBINDEX, TROPONINI in the last 168 hours. BNP (last 3 results) Recent Labs    01/22/20 0410 02/24/20 0030  BNP 697.9* 184.7*    ProBNP (last 3 results) No results for input(s): PROBNP in the last 8760 hours.  CBG: No results for input(s): GLUCAP in the last 168 hours.  No results found for this or any previous visit (from the past 240 hour(s)).   Studies: No results found.  Scheduled Meds: . atorvastatin  40 mg Oral Daily  . busPIRone  10 mg Oral TID  . carvedilol  3.125 mg Oral BID WC  . clonazePAM  0.25 mg Oral Daily  . digoxin  0.125 mg Oral Daily  . enoxaparin (LOVENOX) injection  40 mg Subcutaneous Daily  . fluticasone furoate-vilanterol  1 puff Inhalation Daily  . furosemide  60 mg Oral Daily  . phenytoin  300 mg Oral BID  . pregabalin  50 mg Oral TID  . QUEtiapine  200 mg Oral Daily  . sacubitril-valsartan  1 tablet Oral BID   Continuous Infusions:   Principal Problem:   Hyponatremia Active Problems:   Chronic systolic CHF (congestive heart failure) (HCC)   Mixed hyperlipidemia   COPD (chronic obstructive pulmonary disease) (HCC)   Seizure disorder (HCC)   Paranoid schizophrenia (HCC)   Hypokalemia   Hypomagnesemia   Suicidal ideation   Noncompliance with medications   Subtherapeutic serum dilantin level   Seizure (HCC)        Consultants:  None  Procedures:  None  Antibiotics: Anti-infectives (From admission, onward)   Start     Dose/Rate Route Frequency Ordered Stop   04/02/20 2200  amoxicillin-clavulanate (AUGMENTIN) 875-125 MG per tablet 1 tablet        1 tablet Oral Every 12 hours 04/02/20 1405 04/04/20 2130   03/29/20 0930  Ampicillin-Sulbactam (UNASYN) 3 g in sodium chloride 0.9 % 100 mL IVPB  Status:  Discontinued        3 g 200 mL/hr over 30 Minutes Intravenous Every 6 hours 03/29/20 0915 04/02/20 1405       Time spent: 15 minutes    Junious Silk  ANP  [facility] Pager [phone]. If 7PM-7AM, please contact night-coverage at [email or web address] 04/21/2020, 1:06 PM  LOS: 58 days

## 2020-04-22 NOTE — Progress Notes (Signed)
[facility] PROGRESS NOTE  Jim Dunn HDQ:[id number] DOB: 29-Jun-1966 DOA: 02/23/2020 PCP: System, Pcp Not In  Status: Inpatient---Remains inpatient appropriate because:Altered mental status and Unsafe d/c plan   Dispo: The patient is from: Home              Anticipated d/c is to: Group home              Anticipated d/c date is: > 3 days              Patient currently is not medically stable to d/c. **NEEDS SAFE DC PLAN** Currently awaiting placement, TOC team working on this.  Mother is currently working to obtain Medicaid/guardianship. Unsafe for discharge based on underlying schizophrenia and history of recurrent noncompliance which led to patient's current hospitalization  Discussed with LCSW/case management. Mother presented to hospital on 8/17 to work with patient regarding Medicaid application.  She also obtained a completed FL2 form which was requested by [facility].  Code Status: Full Family Communication: 8/17 updated patient's mother Jim Dunn by telephone HIPPA appropriate VM left [ [phone] or [phone]] DVT prophylaxis: 8/5 refused Lovenox therefore switched to SCDs since ambulating without difficulty Vaccination status: Vaccinated   HPI: 54 year old male with past medical history of schizophrenia, seizure disorder, biventricular heart failure(Echo 01/2020 EF 25-30%with right-sided heart failure),severe tricuspid regurgitation, nicotine dependence and COPD who presents to [facility] emergency department with suicidal ideation and seizure.Patient explains that since he left the hospital several days ago(admitted6/17-6/19-was being managed for hyponatremia, thought to be multifactorial in origin. Patient left AMA on6/19 and that time had sodium of 132),he has been experiencing increasing fatigue. This increasing fatigue is associated with bilateral lower extremity tingling. Symptomsweresimilar to what prompted his presentation on 6/17. Additionally,  he reported feeling depressed withsuicidal ideation, withouta plan. Patient reportedthat he hadattempted suicide in the distant past using pills but was unsuccessful and never sought medical attention. While the patient was waiting in the waiting room, he experienced a witnessed seizure while attempting to walk to the bathroom. Patient was immediately brought back to the emergency department for further evaluation. Patient was found to have substantial recurrent hyponatremia of 117 with concurrent hypokalemia of 2.6 and hypomagnesium of 1.4. Patient was administered 1236mg  of intravenous Fosphenytoinand initiated on low rate normal saline infusion. Patient was administered 2 g of intravenous magnesium and a 30 mEq of potassium.  Patient's electrolytes have improved, no further seizure episodes at this time.  Echo showed EF of 25-30%, cardiology recommended outpatient follow-up but otherwise continue Coreg, torsemide and Entresto.    Subjective: Relaxing in bed.  Nursing staff at bedside performing a.m. care.  Patient tolerated CPAP overnight.  Objective: Vitals:   04/22/20 0437 04/22/20 0829  BP: 106/62 (!) 151/79  Pulse: 77 63  Resp: 18 18  Temp: (!) 97.5 F (36.4 C) 98.2 F (36.8 C)  SpO2: 94% 100%    Intake/Output Summary (Last 24 hours) at 04/22/2020 1156 Last data filed at 04/22/2020 0800 Gross per 24 hour  Intake 720 ml  Output 1000 ml  Net -280 ml   Filed Weights   04/17/20 0420 04/20/20 0411 04/22/20 0700  Weight: 93.7 kg 94.4 kg 95.1 kg    Exam:  Constitutional: NAD, calm, comfortable Respiratory: clear to auscultation bilaterally on posterior exam, Normal respiratory effort at rest.  Room air Cardiovascular: Regular rate, no murmurs / rubs / gallops. No lower extremity edema.  No JVD.  Abdomen: no tenderness, Bowel sounds positive.    Neurologic:  CN 2-12 grossly intact. Sensation intact,Strength 5/5 x all 4 extremities. Ambulates independently Psychiatric:  Alert and oriented x 3 although does not have appropriate insight or capacity to manage care independently 2/2 longstanding schizophrenia.  Pleasant mood.    Assessment/Plan:  Hyponatremia with seizures: Intravascular dehydration, resolved At time of admission diuretics torsemide & metolazone were held and subsequently  discontinued.   As of 8/9 Na+ down to 127 and further decreased to 126 initially went up to 28 but as of 8/12 after acute treatment for asymptomatic volume overload Weight had increased to 210.1 lbs and in positive balance I/O therefore associated decreased sodium suspected volume overload-below regarding treatment regimen 8/13 after diuresis sodium has decreased to 122 therefore Lasix placed on hold for 48 hours with automatic resumption date 8/15 with follow-up sodium 8/16 of 130  Chronic congestive heart failure with reduced ejection fraction, 25%.-Associated right heart failure.   Recently treated for 2 days with twice daily Lasix dosing in context of asymptomatic heart failure exacerbation/weight gain. Na+ subsequently decreased to 126 and weight decreased from 210.1 lbs to 206.8 lbs-Lasix decreased to daily but baseline dosage increased from 40 mg to 60 mg -see above regarding sodium Echo May 2021-EF 25-30% Continue Coreg, Entresto, Daily weights with strict I/O-having excellent urinary output with current Lasix dosage-check electrolyte panel in a.m. Counseled extensively by multiple providers regarding need to adhere to salt restricted diet   Lip edema/dental caries with abscess Resolved Patient developed low-grade fever and evolving upper lip swelling along with pain to upper gum, lips and frontal sinuses Orthopantogram revealed a lucency around the roots of right side posterior molar suggestive of infection Completed IV Unasyn >> Augmentin 8/2  Depression, Suicidal ideations: Resolved, seen by psychiatry.  Recommended to continue BuSpar, Neurontin, Invega.    Follow-up outpatient psychiatry.   Received his routine Invega shot 8/8, takes this monthly-next dose due 9/8  Seizure disorder: Stable Secondary to hyponatremia and issues of nonadherence with medications prior to admission.   Dilantin level subtherapeutic on 7/18 at 2.6 - Dilantin dosage increased to 200 mg twice daily-repeat level on 7/25 also subtherapeutic to therefore dosage increased at recommendation of pharmacy to 200 in the morning and 300 at bedtime Repeat Dilantin level 7/30 has increased but still remains low at 6.4-discussed with pharmacist and have increased a.m. dose from 200 to 300 mg; repeat Dilantin level 8.7 on 8/4- 8/11 level now therapeutic at 11.7 so cont current dose  Microcytic anemia Iron studies relatively normal, hemoglobin stable  OSA Continue nocturnal CPAP-since mask changed compliance has improved  COPD: stable. As needed bronchodilators  Hyperlipidemia: Daily Lipitor 40 mg  Hypokalemia/hypomagnesemia Replace as needed  Prolonged QTC:  Appears to have resolved.   Can you Seroquel 200 mg daily.  Peripheral neuropathy Change gabapentin 300 mg QID to Lyrica 50 mg TID Repeat B12 level  Modest obesity Estimated body mass index is 31.88 kg/m as calculated from the following:   Height as of this encounter: 5\' 8"  (1.727 m).   Weight as of this encounter: 95.1 kg.      Data Reviewed: Basic Metabolic Panel: Recent Labs  Lab 04/16/20 0154 04/17/20 1316 04/18/20 0957  NA 122* 126* 130*  K 3.9 4.2  --   CL 90* 97*  --   CO2 24 20*  --   GLUCOSE 88 88  --   BUN 9 5*  --   CREATININE 0.58* 0.40*  --   CALCIUM 8.9 9.2  --    Liver  Function Tests: No results for input(s): AST, ALT, ALKPHOS, BILITOT, PROT, ALBUMIN in the last 168 hours. No results for input(s): LIPASE, AMYLASE in the last 168 hours. No results for input(s): AMMONIA in the last 168 hours. CBC: No results for input(s): WBC, NEUTROABS, HGB, HCT, MCV, PLT in the last  168 hours. Cardiac Enzymes: No results for input(s): CKTOTAL, CKMB, CKMBINDEX, TROPONINI in the last 168 hours. BNP (last 3 results) Recent Labs    01/22/20 0410 02/24/20 0030  BNP 697.9* 184.7*    ProBNP (last 3 results) No results for input(s): PROBNP in the last 8760 hours.  CBG: No results for input(s): GLUCAP in the last 168 hours.  No results found for this or any previous visit (from the past 240 hour(s)).   Studies: No results found.  Scheduled Meds: . atorvastatin  40 mg Oral Daily  . busPIRone  10 mg Oral TID  . carvedilol  3.125 mg Oral BID WC  . clonazePAM  0.25 mg Oral Daily  . digoxin  0.125 mg Oral Daily  . enoxaparin (LOVENOX) injection  40 mg Subcutaneous Daily  . fluticasone furoate-vilanterol  1 puff Inhalation Daily  . furosemide  60 mg Oral Daily  . phenytoin  300 mg Oral BID  . pregabalin  50 mg Oral TID  . QUEtiapine  200 mg Oral Daily  . sacubitril-valsartan  1 tablet Oral BID   Continuous Infusions:   Principal Problem:   Hyponatremia Active Problems:   Chronic systolic CHF (congestive heart failure) (HCC)   Mixed hyperlipidemia   COPD (chronic obstructive pulmonary disease) (HCC)   Seizure disorder (HCC)   Paranoid schizophrenia (HCC)   Hypokalemia   Hypomagnesemia   Suicidal ideation   Noncompliance with medications   Subtherapeutic serum dilantin level   Seizure (HCC)        Consultants:  None  Procedures:  None  Antibiotics: Anti-infectives (From admission, onward)   Start     Dose/Rate Route Frequency Ordered Stop   04/02/20 2200  amoxicillin-clavulanate (AUGMENTIN) 875-125 MG per tablet 1 tablet        1 tablet Oral Every 12 hours 04/02/20 1405 04/04/20 2130   03/29/20 0930  Ampicillin-Sulbactam (UNASYN) 3 g in sodium chloride 0.9 % 100 mL IVPB  Status:  Discontinued        3 g 200 mL/hr over 30 Minutes Intravenous Every 6 hours 03/29/20 0915 04/02/20 1405       Time spent: 15 minutes    Junious Silk  ANP  [facility] Pager [phone]. If 7PM-7AM, please contact night-coverage at [email or web address] 04/22/2020, 11:56 AM  LOS: 59 days

## 2020-04-22 NOTE — Progress Notes (Signed)
Placed patient on CPAP via FFM, 8.0 cm H20 21%. Tolerating well at this time.

## 2020-04-23 LAB — BASIC METABOLIC PANEL
Anion gap: 6 (ref 5–15)
BUN: 9 mg/dL (ref 6–20)
CO2: 26 mmol/L (ref 22–32)
Calcium: 9 mg/dL (ref 8.9–10.3)
Chloride: 95 mmol/L — ABNORMAL LOW (ref 98–111)
Creatinine, Ser: 0.69 mg/dL (ref 0.61–1.24)
GFR calc Af Amer: >60 mL/min (ref >60)
GFR calc non Af Amer: >60 mL/min (ref >60)
Glucose, Bld: 94 mg/dL (ref 70–99)
Potassium: 4.2 mmol/L (ref 3.5–5.1)
Sodium: 127 mmol/L — ABNORMAL LOW (ref 135–145)

## 2020-04-23 NOTE — Progress Notes (Signed)
[facility] PROGRESS NOTE  Ryland Tungate VVO:[id number] DOB: 12-Jan-1966 DOA: 02/23/2020 PCP: System, Pcp Not In  Status: Inpatient---Remains inpatient appropriate because:Altered mental status and Unsafe d/c plan   Dispo: The patient is from: Home              Anticipated d/c is to: Group home              Anticipated d/c date is: > 3 days              Patient currently is not medically stable to d/c. **NEEDS SAFE DC PLAN** Currently awaiting placement, TOC team working on this.  Mother is currently working to obtain Medicaid/guardianship. Unsafe for discharge based on underlying schizophrenia and history of recurrent noncompliance which led to patient's current hospitalization  Discussed with LCSW/case management. Mother presented to hospital on 8/17 to work with patient regarding Medicaid application.  She also obtained a completed FL2 form which was requested by [facility].  Code Status: Full Family Communication: 8/17 updated [name] by telephone HIPPA appropriate VM left [ [phone] or [phone]] DVT prophylaxis: 8/5 refused Lovenox therefore switched to SCDs since ambulating without difficulty Vaccination status: Vaccinated   HPI: 54 year old male with past medical history of schizophrenia, seizure disorder, biventricular heart failure(Echo 01/2020 EF 25-30%with right-sided heart failure),severe tricuspid regurgitation, nicotine dependence and COPD who presents to [facility] emergency department with suicidal ideation and seizure.Patient explains that since he left the hospital several days ago(admitted6/17-6/19-was being managed for hyponatremia, thought to be multifactorial in origin. Patient left AMA on6/19 and that time had sodium of 132),he has been experiencing increasing fatigue. This increasing fatigue is associated with bilateral lower extremity tingling. Symptomsweresimilar to what prompted his presentation on 6/17. Additionally,  he reported feeling depressed withsuicidal ideation, withouta plan. Patient reportedthat he hadattempted suicide in the distant past using pills but was unsuccessful and never sought medical attention. While the patient was waiting in the waiting room, he experienced a witnessed seizure while attempting to walk to the bathroom. Patient was immediately brought back to the emergency department for further evaluation. Patient was found to have substantial recurrent hyponatremia of 117 with concurrent hypokalemia of 2.6 and hypomagnesium of 1.4. Patient was administered 1236mg  of intravenous Fosphenytoinand initiated on low rate normal saline infusion. Patient was administered 2 g of intravenous magnesium and a 30 mEq of potassium.  Patient's electrolytes have improved, no further seizure episodes at this time.  Echo showed EF of 25-30%, cardiology recommended outpatient follow-up but otherwise continue Coreg, torsemide and Entresto.    Subjective: In chair looking at phone. Denies shortness of breath, chest pain or difficulty sleeping.  Objective: Vitals:   04/23/20 0832 04/23/20 0908  BP: 98/67   Pulse: 99   Resp: 18   Temp: 98.2 F (36.8 C)   SpO2: 100% 98%    Intake/Output Summary (Last 24 hours) at 04/23/2020 1223 Last data filed at 04/23/2020 0654 Gross per 24 hour  Intake --  Output 4550 ml  Net -4550 ml   Filed Weights   04/20/20 0411 04/22/20 0700 04/23/20 0654  Weight: 94.4 kg 95.1 kg 94.1 kg    Exam:  Constitutional: NAD, calm, comfortable Respiratory: clear to auscultation bilaterally on posterior exam, Normal respiratory effort at rest.  Room air Cardiovascular: Regular rate, no murmurs / rubs / gallops. No lower extremity edema.  No JVD.  Abdomen: no tenderness, Bowel sounds positive.    Neurologic: CN 2-12 grossly intact. Sensation intact,Strength 5/5 x  all 4 extremities. Ambulates independently Psychiatric: Alert and oriented x 3 although does not have  appropriate insight or capacity to manage care independently   Assessment/Plan:  Hyponatremia with seizures: Intravascular dehydration, resolved At time of admission diuretics torsemide & metolazone were held and subsequently  discontinued.   As of 8/9 Na+ down to 127 and further decreased to 126 initially went up to 28 but as of 8/12 after acute treatment for asymptomatic volume overload Weight had increased to 210.1 lbs and in positive balance I/O therefore associated decreased sodium suspected volume overload-below regarding treatment regimen 8/13 after diuresis sodium has decreased to 122 therefore Lasix placed on hold for 48 hours with automatic resumption date 8/15 with follow-up sodium 8/20 of 127  Chronic congestive heart failure with reduced ejection fraction, 25%.-Associated right heart failure.   Recently treated for 2 days with twice daily Lasix dosing in context of asymptomatic heart failure exacerbation/weight gain. Na+ subsequently decreased to 126 and weight decreased from 210.1 lbs to 206.8 lbs-Lasix decreased to daily but baseline dosage increased from 40 mg to 60 mg -see above regarding sodium Echo May 2021-EF 25-30% Continue Coreg, Entresto, Daily weights with strict I/O-having excellent urinary output with current Lasix dosage-check electrolyte panel in a.m. Counseled extensively by multiple providers regarding need to adhere to salt restricted diet  Currently in a net negative balance of 1300 cc  Lip edema/dental caries with abscess Resolved Patient developed low-grade fever and evolving upper lip swelling along with pain to upper gum, lips and frontal sinuses Orthopantogram revealed a lucency around the roots of right side posterior molar suggestive of infection Completed IV Unasyn >> Augmentin 8/2  Depression, Suicidal ideations: Resolved, seen by psychiatry.  Recommended to continue BuSpar, Neurontin, Invega.   Follow-up outpatient psychiatry.   Received his  routine Invega shot 8/8, takes this monthly-next dose due 9/8  Seizure disorder: Stable Secondary to hyponatremia and issues of nonadherence with medications prior to admission.   Dilantin level subtherapeutic on 7/18 at 2.6 - Dilantin dosage increased to 200 mg twice daily-repeat level on 7/25 also subtherapeutic to therefore dosage increased at recommendation of pharmacy to 200 in the morning and 300 at bedtime Repeat Dilantin level 7/30 has increased but still remains low at 6.4-discussed with pharmacist and have increased a.m. dose from 200 to 300 mg; repeat Dilantin level 8.7 on 8/4- 8/11 level now therapeutic at 11.7 so cont current dose  Microcytic anemia Iron studies relatively normal, hemoglobin stable  OSA Continue nocturnal CPAP-since mask changed compliance has improved  COPD: stable. As needed bronchodilators  Hyperlipidemia: Daily Lipitor 40 mg  Hypokalemia/hypomagnesemia Replace as needed  Prolonged QTC:  Appears to have resolved.   Can you Seroquel 200 mg daily.  Peripheral neuropathy Change gabapentin 300 mg QID to Lyrica 50 mg TID Repeat B12 level  Modest obesity Estimated body mass index is 31.55 kg/m as calculated from the following:   Height as of this encounter: 5\' 8"  (1.727 m).   Weight as of this encounter: 94.1 kg.      Data Reviewed: Basic Metabolic Panel: Recent Labs  Lab 04/17/20 1316 04/18/20 0957 04/23/20 0155  NA 126* 130* 127*  K 4.2  --  4.2  CL 97*  --  95*  CO2 20*  --  26  GLUCOSE 88  --  94  BUN 5*  --  9  CREATININE 0.40*  --  0.69  CALCIUM 9.2  --  9.0   Liver Function Tests: No results for input(s):  AST, ALT, ALKPHOS, BILITOT, PROT, ALBUMIN in the last 168 hours. No results for input(s): LIPASE, AMYLASE in the last 168 hours. No results for input(s): AMMONIA in the last 168 hours. CBC: No results for input(s): WBC, NEUTROABS, HGB, HCT, MCV, PLT in the last 168 hours. Cardiac Enzymes: No results for  input(s): CKTOTAL, CKMB, CKMBINDEX, TROPONINI in the last 168 hours. BNP (last 3 results) Recent Labs    01/22/20 0410 02/24/20 0030  BNP 697.9* 184.7*    ProBNP (last 3 results) No results for input(s): PROBNP in the last 8760 hours.  CBG: No results for input(s): GLUCAP in the last 168 hours.  No results found for this or any previous visit (from the past 240 hour(s)).   Studies: No results found.  Scheduled Meds: . atorvastatin  40 mg Oral Daily  . busPIRone  10 mg Oral TID  . carvedilol  3.125 mg Oral BID WC  . clonazePAM  0.25 mg Oral Daily  . digoxin  0.125 mg Oral Daily  . enoxaparin (LOVENOX) injection  40 mg Subcutaneous Daily  . fluticasone furoate-vilanterol  1 puff Inhalation Daily  . furosemide  60 mg Oral Daily  . phenytoin  300 mg Oral BID  . pregabalin  50 mg Oral TID  . QUEtiapine  200 mg Oral Daily  . sacubitril-valsartan  1 tablet Oral BID   Continuous Infusions:   Principal Problem:   Hyponatremia Active Problems:   Chronic systolic CHF (congestive heart failure) (HCC)   Mixed hyperlipidemia   COPD (chronic obstructive pulmonary disease) (HCC)   Seizure disorder (HCC)   Paranoid schizophrenia (HCC)   Hypokalemia   Hypomagnesemia   Suicidal ideation   Noncompliance with medications   Subtherapeutic serum dilantin level   Seizure (HCC)        Consultants:  None  Procedures:  None  Antibiotics: Anti-infectives (From admission, onward)   Start     Dose/Rate Route Frequency Ordered Stop   04/02/20 2200  amoxicillin-clavulanate (AUGMENTIN) 875-125 MG per tablet 1 tablet        1 tablet Oral Every 12 hours 04/02/20 1405 04/04/20 2130   03/29/20 0930  Ampicillin-Sulbactam (UNASYN) 3 g in sodium chloride 0.9 % 100 mL IVPB  Status:  Discontinued        3 g 200 mL/hr over 30 Minutes Intravenous Every 6 hours 03/29/20 0915 04/02/20 1405       Time spent: 15 minutes    Junious Silk ANP  [facility] Pager [phone]. If  7PM-7AM, please contact night-coverage at [email or web address] 04/23/2020, 12:23 PM  LOS: 60 days

## 2020-04-24 ENCOUNTER — Inpatient Hospital Stay (HOSPITAL_COMMUNITY): Payer: Medicare PPO

## 2020-04-24 DIAGNOSIS — K029 Dental caries, unspecified: Secondary | ICD-10-CM

## 2020-04-24 DIAGNOSIS — J431 Panlobular emphysema: Secondary | ICD-10-CM

## 2020-04-24 DIAGNOSIS — K122 Cellulitis and abscess of mouth: Secondary | ICD-10-CM

## 2020-04-24 LAB — CBC WITH DIFFERENTIAL/PLATELET
Abs Immature Granulocytes: 0.08 10*3/uL — ABNORMAL HIGH (ref 0.00–0.07)
Basophils Absolute: 0 10*3/uL (ref 0.0–0.1)
Basophils Relative: 0 %
Eosinophils Absolute: 0.3 10*3/uL (ref 0.0–0.5)
Eosinophils Relative: 2 %
HCT: 33.7 % — ABNORMAL LOW (ref 39.0–52.0)
Hemoglobin: 10.7 g/dL — ABNORMAL LOW (ref 13.0–17.0)
Immature Granulocytes: 1 %
Lymphocytes Relative: 30 %
Lymphs Abs: 3.2 10*3/uL (ref 0.7–4.0)
MCH: 21.4 pg — ABNORMAL LOW (ref 26.0–34.0)
MCHC: 31.8 g/dL (ref 30.0–36.0)
MCV: 67.5 fL — ABNORMAL LOW (ref 80.0–100.0)
Monocytes Absolute: 1.4 10*3/uL — ABNORMAL HIGH (ref 0.1–1.0)
Monocytes Relative: 13 %
Neutro Abs: 5.8 10*3/uL (ref 1.7–7.7)
Neutrophils Relative %: 54 %
Platelets: 209 10*3/uL (ref 150–400)
RBC: 4.99 MIL/uL (ref 4.22–5.81)
RDW: 23.1 % — ABNORMAL HIGH (ref 11.5–15.5)
WBC: 10.7 10*3/uL — ABNORMAL HIGH (ref 4.0–10.5)
nRBC: 0 % (ref 0.0–0.2)

## 2020-04-24 LAB — PHOSPHORUS: Phosphorus: 4.5 mg/dL (ref 2.5–4.6)

## 2020-04-24 LAB — COMPREHENSIVE METABOLIC PANEL
ALT: 31 U/L (ref 0–44)
AST: 24 U/L (ref 15–41)
Albumin: 3.6 g/dL (ref 3.5–5.0)
Alkaline Phosphatase: 200 U/L — ABNORMAL HIGH (ref 38–126)
Anion gap: 9 (ref 5–15)
BUN: 9 mg/dL (ref 6–20)
CO2: 25 mmol/L (ref 22–32)
Calcium: 9.1 mg/dL (ref 8.9–10.3)
Chloride: 91 mmol/L — ABNORMAL LOW (ref 98–111)
Creatinine, Ser: 0.5 mg/dL — ABNORMAL LOW (ref 0.61–1.24)
GFR calc Af Amer: >60 mL/min (ref >60)
GFR calc non Af Amer: >60 mL/min (ref >60)
Glucose, Bld: 109 mg/dL — ABNORMAL HIGH (ref 70–99)
Potassium: 4.1 mmol/L (ref 3.5–5.1)
Sodium: 125 mmol/L — ABNORMAL LOW (ref 135–145)
Total Bilirubin: 0.5 mg/dL (ref 0.3–1.2)
Total Protein: 7 g/dL (ref 6.5–8.1)

## 2020-04-24 LAB — LACTIC ACID, PLASMA
Lactic Acid, Venous: 0.7 mmol/L (ref 0.5–1.9)
Lactic Acid, Venous: 1.1 mmol/L (ref 0.5–1.9)

## 2020-04-24 LAB — MAGNESIUM: Magnesium: 1.8 mg/dL (ref 1.7–2.4)

## 2020-04-24 MED ORDER — SODIUM CHLORIDE 0.9 % IV SOLN
3.0000 g | Freq: Four times a day (QID) | INTRAVENOUS | Status: DC
  Administered 2020-04-24 – 2020-05-02 (×29): 3 g via INTRAVENOUS
  Filled 2020-04-24 (×4): qty 8
  Filled 2020-04-24: qty 3
  Filled 2020-04-24 (×2): qty 8
  Filled 2020-04-24 (×5): qty 3
  Filled 2020-04-24 (×2): qty 8
  Filled 2020-04-24 (×2): qty 3
  Filled 2020-04-24 (×5): qty 8
  Filled 2020-04-24: qty 3
  Filled 2020-04-24 (×3): qty 8
  Filled 2020-04-24: qty 3
  Filled 2020-04-24: qty 8
  Filled 2020-04-24 (×3): qty 3
  Filled 2020-04-24: qty 8
  Filled 2020-04-24 (×2): qty 3

## 2020-04-24 NOTE — Progress Notes (Signed)
Pt. Refused cpap for tonight. 

## 2020-04-24 NOTE — Progress Notes (Signed)
PROGRESS NOTE    Jim Dunn  QIH:[id number] DOB: Jul 05, 1966 DOA: 02/23/2020 PCP: System, Pcp Not In     Brief Narrative:  54 year old BM PMHx Schizophrenia, seizure disorder, biventricular heart failure(Echo 01/2020 EF 25-30%with right-sided heart failure),severe tricuspid regurgitation, nicotine dependence and COPD   Presents to [facility] emergency department with suicidal ideation and seizure.Patient explains that since he left the hospital several days ago(admitted6/17-6/19-was being managed for hyponatremia, thought to be multifactorial in origin. Patient left AMA on6/19 and that time had sodium of 132),he has been experiencing increasing fatigue. This increasing fatigue is associated with bilateral lower extremity tingling. Symptomsweresimilar to what prompted his presentation on 6/17. Additionally, he reported feeling depressed withsuicidal ideation, withouta plan. Patient reportedthat he hadattempted suicide in the distant past using pills but was unsuccessful and never sought medical attention. While the patient was waiting in the waiting room, he experienced a witnessed seizure while attempting to walk to the bathroom. Patient was immediately brought back to the emergency department for further evaluation. Patient was found to have substantial recurrent hyponatremia of 117 with concurrent hypokalemia of 2.6 and hypomagnesium of 1.4. Patient was administered 1236mg  of intravenous Fosphenytoinand initiated on low rate normal saline infusion. Patient was administered 2 g of intravenous magnesium and a 30 mEq of potassium.Patient's electrolytes have improved, no further seizure episodes at this time. Echo showed EF of 25-30%, cardiology recommended outpatient follow-up but otherwise continue Coreg, torsemide and Entresto.    Subjective: Afebrile last 24 hours A/O x4, negative S OB, negative CP, negative abdominal pain positive bilateral facial pain LEFT>>>  RIGHT   Assessment & Plan: Covid vaccination; positive vaccination   Principal Problem:   Hyponatremia Active Problems:   Chronic systolic CHF (congestive heart failure) (HCC)   Mixed hyperlipidemia   COPD (chronic obstructive pulmonary disease) (HCC)   Seizure disorder (HCC)   Paranoid schizophrenia (HCC)   Hypokalemia   Hypomagnesemia   Suicidal ideation   Noncompliance with medications   Subtherapeutic serum dilantin level   Seizure (HCC)   Hyponatremia with seizures: -Patient's sodium continues to trend down   Intravascular dehydration,resolved  Chronic systolic CHF -May/2021 EF 25% -Strict renal -1.9 L -Daily weight  Filed Weights   04/22/20 0700 04/23/20 0654 04/24/20 0500  Weight: 95.1 kg 94.1 kg 95.4 kg    Lip edema/Periodontal Abscesses -8/21 patient treated with course of Completed IV Unasyn >> Augmentin 8/2  -8/21 repeat Orthopantogram secondary to facial swelling, gum swelling, pain -8/21Orthopantogram reveals continued abscess results below -Restart Unasyn, consult Dentist in A.m. patient needs most if not all of his teeth pulled as well as the fragments of his teeth removed.  Depression, Suicidal ideations: Resolved, seen by psychiatry. Recommended to continue BuSpar, Neurontin, Invega.  Follow-up outpatient psychiatry.  Received his routine Invega shot 8/8, takes this monthly-next dose due 9/8  Seizure disorder:Stable Secondary to hyponatremia and issues of nonadherence with medications prior to admission.  Dilantin level subtherapeutic on 7/18 at 2.6 - Dilantin dosage increased to 200 mg twice daily-repeat level on 7/25 also subtherapeutic to therefore dosage increased at recommendation of pharmacy to 200 in the morning and 300 at bedtime Repeat Dilantin level 7/30 has increased but still remains low at 6.4-discussed with pharmacist and have increased a.m. dose from 200 to 300 mg; repeat Dilantin level 8.7 on 8/4- 8/11 level now  therapeutic at 11.7 so cont current dose  Microcytic anemia Iron studies relatively normal, hemoglobin stable  OSA Continue nocturnal CPAP-since mask changed compliance has improved  COPD: stable. As needed bronchodilators  Hyperlipidemia:  Daily Lipitor 40 mg  Hypokalemia/hypomagnesemia Replace as needed  Prolonged QTC:  Appears to have resolved.  Can you Seroquel 200 mg daily.  Peripheral neuropathy Change gabapentin 300 mg QID to Lyrica 50 mg TID Repeat B12 level  Modest obesity Estimated body mass index is 31.55 kg/m as calculated from the following:   Height as of this encounter: 5\' 8"  (1.727 m).   Weight as of this encounter: 94.1 kg.   DVT prophylaxis:  Code Status: Full Family Communication:  Status is: Inpatient    Dispo: The patient is from: Home              Anticipated d/c is to: Group home placement               Anticipated d/c date is:??              Patient currently unstable      Consultants:  Psychiatry CHF team   Procedures/Significant Events:  8/21Orthopantogram;Periapical lucencies of a RIGHT UPPER molar and of all non-incisor LOWER teeth are compatible with periapical periodontitis/abscesses. -Multiple missing teeth and dental caries  I have personally reviewed and interpreted all radiology studies and my findings are as above.  VENTILATOR SETTINGS:    Cultures 6/21 SARS coronavirus negative    Antimicrobials:    Devices    LINES / TUBES:      Continuous Infusions:   Objective: Vitals:   04/24/20 0004 04/24/20 0004 04/24/20 0423 04/24/20 0500  BP: 100/64 100/64 103/62   Pulse: 91 91 76   Resp: 18 18 19    Temp: 98.5 F (36.9 C) 98.5 F (36.9 C) 98.2 F (36.8 C)   TempSrc: Oral Oral Oral   SpO2: 97% 97% 98%   Weight:    95.4 kg  Height:        Intake/Output Summary (Last 24 hours) at 04/24/2020 0815 Last data filed at 04/23/2020 2100 Gross per 24 hour  Intake 960 ml  Output 1100 ml   Net -140 ml   Filed Weights   04/22/20 0700 04/23/20 0654 04/24/20 0500  Weight: 95.1 kg 94.1 kg 95.4 kg    Examination:  General: A/O x4, No acute respiratory distress Eyes: negative scleral hemorrhage, negative anisocoria, negative icterus ENT: Negative Runny nose, negative gingival bleeding, patient with extremely poor dentation cracked and broken teeth on both sides of mouth upper and lower, swollen jaws tender to the touch warm to the touch. Neck:  Negative scars, masses, torticollis, lymphadenopathy, JVD Lungs: Clear to auscultation bilaterally without wheezes or crackles Cardiovascular: Regular rate and rhythm without murmur gallop or rub normal S1 and S2 Abdomen: negative abdominal pain, nondistended, positive soft, bowel sounds, no rebound, no ascites, no appreciable mass Extremities: No significant cyanosis, clubbing, or edema bilateral lower extremities Skin: Negative rashes, lesions, ulcers Psychiatric:  Negative depression, negative anxiety, negative fatigue, negative mania  Central nervous system:  Cranial nerves II through XII intact, tongue/uvula midline, all extremities muscle strength 5/5, sensation intact throughout, negative dysarthria, negative expressive aphasia, negative receptive aphasia.  .     Data Reviewed: Care during the described time interval was provided by me .  I have reviewed this patient's available data, including medical history, events of note, physical examination, and all test results as part of my evaluation.  CBC: No results for input(s): WBC, NEUTROABS, HGB, HCT, MCV, PLT in the last 168 hours. Basic Metabolic Panel: Recent Labs  Lab 04/17/20 1316 04/18/20 0957  04/23/20 0155  NA 126* 130* 127*  K 4.2  --  4.2  CL 97*  --  95*  CO2 20*  --  26  GLUCOSE 88  --  94  BUN 5*  --  9  CREATININE 0.40*  --  0.69  CALCIUM 9.2  --  9.0   GFR: Estimated Creatinine Clearance: 121 mL/min (by C-G formula based on SCr of 0.69 mg/dL). Liver  Function Tests: No results for input(s): AST, ALT, ALKPHOS, BILITOT, PROT, ALBUMIN in the last 168 hours. No results for input(s): LIPASE, AMYLASE in the last 168 hours. No results for input(s): AMMONIA in the last 168 hours. Coagulation Profile: No results for input(s): INR, PROTIME in the last 168 hours. Cardiac Enzymes: No results for input(s): CKTOTAL, CKMB, CKMBINDEX, TROPONINI in the last 168 hours. BNP (last 3 results) No results for input(s): PROBNP in the last 8760 hours. HbA1C: No results for input(s): HGBA1C in the last 72 hours. CBG: No results for input(s): GLUCAP in the last 168 hours. Lipid Profile: No results for input(s): CHOL, HDL, LDLCALC, TRIG, CHOLHDL, LDLDIRECT in the last 72 hours. Thyroid Function Tests: No results for input(s): TSH, T4TOTAL, FREET4, T3FREE, THYROIDAB in the last 72 hours. Anemia Panel: No results for input(s): VITAMINB12, FOLATE, FERRITIN, TIBC, IRON, RETICCTPCT in the last 72 hours. Sepsis Labs: No results for input(s): PROCALCITON, LATICACIDVEN in the last 168 hours.  No results found for this or any previous visit (from the past 240 hour(s)).       Radiology Studies: No results found.      Scheduled Meds: . atorvastatin  40 mg Oral Daily  . busPIRone  10 mg Oral TID  . carvedilol  3.125 mg Oral BID WC  . clonazePAM  0.25 mg Oral Daily  . digoxin  0.125 mg Oral Daily  . enoxaparin (LOVENOX) injection  40 mg Subcutaneous Daily  . fluticasone furoate-vilanterol  1 puff Inhalation Daily  . furosemide  60 mg Oral Daily  . phenytoin  300 mg Oral BID  . pregabalin  50 mg Oral TID  . QUEtiapine  200 mg Oral Daily  . sacubitril-valsartan  1 tablet Oral BID   Continuous Infusions:   LOS: 61 days    Time spent:40 min    Hye Trawick, Roselind Messier, MD [facility] Pager [phone]  If 7PM-7AM, please contact night-coverage [email or web address] Password [facility] 04/24/2020, 8:15 AM

## 2020-04-25 LAB — CBC WITH DIFFERENTIAL/PLATELET
Abs Immature Granulocytes: 0.06 10*3/uL (ref 0.00–0.07)
Basophils Absolute: 0 10*3/uL (ref 0.0–0.1)
Basophils Relative: 1 %
Eosinophils Absolute: 0.3 10*3/uL (ref 0.0–0.5)
Eosinophils Relative: 3 %
HCT: 32.1 % — ABNORMAL LOW (ref 39.0–52.0)
Hemoglobin: 10.3 g/dL — ABNORMAL LOW (ref 13.0–17.0)
Immature Granulocytes: 1 %
Lymphocytes Relative: 35 %
Lymphs Abs: 3.1 10*3/uL (ref 0.7–4.0)
MCH: 21.9 pg — ABNORMAL LOW (ref 26.0–34.0)
MCHC: 32.1 g/dL (ref 30.0–36.0)
MCV: 68.3 fL — ABNORMAL LOW (ref 80.0–100.0)
Monocytes Absolute: 1.2 10*3/uL — ABNORMAL HIGH (ref 0.1–1.0)
Monocytes Relative: 14 %
Neutro Abs: 4 10*3/uL (ref 1.7–7.7)
Neutrophils Relative %: 46 %
Platelets: 205 10*3/uL (ref 150–400)
RBC: 4.7 MIL/uL (ref 4.22–5.81)
RDW: 22.9 % — ABNORMAL HIGH (ref 11.5–15.5)
WBC: 8.7 10*3/uL (ref 4.0–10.5)
nRBC: 0 % (ref 0.0–0.2)

## 2020-04-25 LAB — COMPREHENSIVE METABOLIC PANEL
ALT: 32 U/L (ref 0–44)
AST: 37 U/L (ref 15–41)
Albumin: 3.4 g/dL — ABNORMAL LOW (ref 3.5–5.0)
Alkaline Phosphatase: 200 U/L — ABNORMAL HIGH (ref 38–126)
Anion gap: 9 (ref 5–15)
BUN: 9 mg/dL (ref 6–20)
CO2: 25 mmol/L (ref 22–32)
Calcium: 8.9 mg/dL (ref 8.9–10.3)
Chloride: 93 mmol/L — ABNORMAL LOW (ref 98–111)
Creatinine, Ser: 0.6 mg/dL — ABNORMAL LOW (ref 0.61–1.24)
GFR calc Af Amer: >60 mL/min (ref >60)
GFR calc non Af Amer: >60 mL/min (ref >60)
Glucose, Bld: 95 mg/dL (ref 70–99)
Potassium: 4.5 mmol/L (ref 3.5–5.1)
Sodium: 127 mmol/L — ABNORMAL LOW (ref 135–145)
Total Bilirubin: 0.6 mg/dL (ref 0.3–1.2)
Total Protein: 6.8 g/dL (ref 6.5–8.1)

## 2020-04-25 LAB — MAGNESIUM: Magnesium: 1.7 mg/dL (ref 1.7–2.4)

## 2020-04-25 LAB — PHOSPHORUS: Phosphorus: 4 mg/dL (ref 2.5–4.6)

## 2020-04-25 MED ORDER — MAGNESIUM SULFATE 50 % IJ SOLN
3.0000 g | Freq: Once | INTRAVENOUS | Status: AC
  Administered 2020-04-25: 3 g via INTRAVENOUS
  Filled 2020-04-25: qty 6

## 2020-04-25 NOTE — Plan of Care (Signed)
Patient stable, discussed POC with patient, agreeable with plan, denies question/concerns at this time.   Problem: Education: Goal: Expressions of having a comfortable level of knowledge regarding the disease process will increase Outcome: Progressing   Problem: Health Behavior/Discharge Planning: Goal: Compliance with prescribed medication regimen will improve Outcome: Progressing   Problem: Medication: Goal: Risk for medication side effects will decrease Outcome: Progressing   Problem: Clinical Measurements: Goal: Complications related to the disease process, condition or treatment will be avoided or minimized Outcome: Progressing Goal: Diagnostic test results will improve Outcome: Progressing   Problem: Safety: Goal: Verbalization of understanding the information provided will improve Outcome: Progressing   Problem: Self-Concept: Goal: Level of anxiety will decrease Outcome: Progressing Goal: Ability to verbalize feelings about condition will improve Outcome: Progressing   Problem: Education: Goal: Knowledge of General Education information will improve Description: Including pain rating scale, medication(s)/side effects and non-pharmacologic comfort measures Outcome: Progressing   Problem: Health Behavior/Discharge Planning: Goal: Ability to manage health-related needs will improve Outcome: Progressing   Problem: Clinical Measurements: Goal: Ability to maintain clinical measurements within normal limits will improve Outcome: Progressing Goal: Will remain Jim Dunn from infection Outcome: Progressing Goal: Diagnostic test results will improve Outcome: Progressing Goal: Respiratory complications will improve Outcome: Progressing Goal: Cardiovascular complication will be avoided Outcome: Progressing   Problem: Activity: Goal: Risk for activity intolerance will decrease Outcome: Progressing   Problem: Nutrition: Goal: Adequate nutrition will be maintained Outcome:  Progressing   Problem: Coping: Goal: Level of anxiety will decrease Outcome: Progressing   Problem: Elimination: Goal: Will not experience complications related to bowel motility Outcome: Progressing Goal: Will not experience complications related to urinary retention Outcome: Progressing   Problem: Pain Managment: Goal: General experience of comfort will improve Outcome: Progressing   Problem: Safety: Goal: Ability to remain [name] from injury will improve Outcome: Progressing   Problem: Skin Integrity: Goal: Risk for impaired skin integrity will decrease Outcome: Progressing

## 2020-04-25 NOTE — Progress Notes (Signed)
PROGRESS NOTE    Jim Dunn  ORV:[id number] DOB: 1966/02/14 DOA: 02/23/2020 PCP: System, Pcp Not In     Brief Narrative:  54 year old BM PMHx Schizophrenia, seizure disorder, biventricular heart failure(Echo 01/2020 EF 25-30%with right-sided heart failure),severe tricuspid regurgitation, nicotine dependence and COPD   Presents to [facility] emergency department with suicidal ideation and seizure.Patient explains that since he left the hospital several days ago(admitted6/17-6/19-was being managed for hyponatremia, thought to be multifactorial in origin. Patient left AMA on6/19 and that time had sodium of 132),he has been experiencing increasing fatigue. This increasing fatigue is associated with bilateral lower extremity tingling. Symptomsweresimilar to what prompted his presentation on 6/17. Additionally, he reported feeling depressed withsuicidal ideation, withouta plan. Patient reportedthat he hadattempted suicide in the distant past using pills but was unsuccessful and never sought medical attention. While the patient was waiting in the waiting room, he experienced a witnessed seizure while attempting to walk to the bathroom. Patient was immediately brought back to the emergency department for further evaluation. Patient was found to have substantial recurrent hyponatremia of 117 with concurrent hypokalemia of 2.6 and hypomagnesium of 1.4. Patient was administered 1236mg  of intravenous Fosphenytoinand initiated on low rate normal saline infusion. Patient was administered 2 g of intravenous magnesium and a 30 mEq of potassium.Patient's electrolytes have improved, no further seizure episodes at this time. Echo showed EF of 25-30%, cardiology recommended outpatient follow-up but otherwise continue Coreg, torsemide and Entresto.    Subjective: 8/22 afebrile last 24 hours, negative S OB, negative CP, negative abdominal pain.  Continued facial pain but decreased.   RIGHT lower jawline>>>> REMAINDER of face   Assessment & Plan: Covid vaccination; positive vaccination   Principal Problem:   Hyponatremia Active Problems:   Chronic systolic CHF (congestive heart failure) (HCC)   Mixed hyperlipidemia   COPD (chronic obstructive pulmonary disease) (HCC)   Seizure disorder (HCC)   Paranoid schizophrenia (HCC)   Hypokalemia   Hypomagnesemia   Suicidal ideation   Noncompliance with medications   Subtherapeutic serum dilantin level   Seizure (HCC)   Hyponatremia with seizures: Lab Results  Component Value Date   NA 127 (L) 04/25/2020   NA 125 (L) 04/24/2020   NA 127 (L) 04/23/2020   NA 130 (L) 04/18/2020   NA 126 (L) 04/17/2020  -Low but stable.  Patient asymptomatic most likely his baseline   Intravascular dehydration, -Resolved  Chronic systolic CHF -May/2021 EF 25% -Strict renal -1.4 L -Daily weight  Filed Weights   04/23/20 0654 04/24/20 0500 04/25/20 0500  Weight: 94.1 kg 95.4 kg 96 kg    Lip edema/Periodontal Abscesses -8/21 patient treated with course of Completed IV Unasyn >> Augmentin 8/2  -8/21 repeat Orthopantogram secondary to facial swelling, gum swelling, pain -8/21Orthopantogram reveals continued abscess results below -Restart Unasyn, -8/22 discussed case with DDS Letitia Neri who stated give them a call back during the week.   Patient needs most if not all of his teeth pulled as well as the fragments of his teeth removed.  Depression, Suicidal ideations: -Resolved, seen by psychiatry.  -Recommended to continue BuSpar 10 mg TID -Klonopin 0.25 mg daily -Lyrica 50 mg  TID -Seroquel 200 mg daily -follow-up outpatient psychiatry.  -Received his routine Invega shot 8/8, takes this monthly-next dose due 9/8  Seizure disorder:Stable Secondary to hyponatremia and issues of nonadherence with medications prior to admission.  Dilantin level subtherapeutic on 7/18 at 2.6 - Dilantin dosage increased to 200 mg  twice daily-repeat level on 7/25 also subtherapeutic  to therefore dosage increased at recommendation of pharmacy to 200 in the morning and 300 at bedtime Repeat Dilantin level 7/30 has increased but still remains low at 6.4-discussed with pharmacist and have increased a.m. dose from 200 to 300 mg; repeat Dilantin level 8.7 on 8/4- 8/11 level now therapeutic at 11.7 so cont current dose  Microcytic anemia Iron studies relatively normal, hemoglobin stable  OSA -CPAP per respiratory   COPD: stable. -As needed bronchodilators  Hyperlipidemia:  -Lipitor 40 mg daily   Hypokalemia -Potassium goal> 4  Hypomagnesmia -Magnesium goal> 2 -Magnesium IV 3 g  Prolonged QTC:  -Appears to have resolved.  -Can you Seroquel 200 mg daily.  Peripheral neuropathy - Lyrica 50 mg TID -Repeat B12 level= 401  Modest obesity Estimated body mass index is 31.55 kg/m as calculated from the following:   Height as of this encounter: 5\' 8"  (1.727 m).   Weight as of this encounter: 94.1 kg.   DVT prophylaxis: Lovenox Code Status: Full Family Communication:  Status is: Inpatient    Dispo: The patient is from: Home              Anticipated d/c is to: Group home placement               Anticipated d/c date is:??              Patient currently unstable      Consultants:  Psychiatry CHF team   Procedures/Significant Events:  8/21Orthopantogram;Periapical lucencies of a RIGHT UPPER molar and of all non-incisor LOWER teeth are compatible with periapical periodontitis/abscesses. -Multiple missing teeth and dental caries  I have personally reviewed and interpreted all radiology studies and my findings are as above.  VENTILATOR SETTINGS:    Cultures 6/21 SARS coronavirus negative    Antimicrobials: Anti-infectives (From admission, onward)   Start     Ordered Stop   04/24/20 2100  Ampicillin-Sulbactam (UNASYN) 3 g in sodium chloride 0.9 % 100 mL IVPB        04/24/20 1936      04/02/20 2200  amoxicillin-clavulanate (AUGMENTIN) 875-125 MG per tablet 1 tablet        04/02/20 1405 04/04/20 2130   03/29/20 0930  Ampicillin-Sulbactam (UNASYN) 3 g in sodium chloride 0.9 % 100 mL IVPB  Status:  Discontinued        03/29/20 0915 04/02/20 1405       Devices    LINES / TUBES:      Continuous Infusions: . ampicillin-sulbactam (UNASYN) IV Stopped (04/25/20 1243)     Objective: Vitals:   04/25/20 0500 04/25/20 0733 04/25/20 0917 04/25/20 1214  BP:   108/63 110/77  Pulse:  72 93 71  Resp:  16 16 16   Temp:   98 F (36.7 C) 97.6 F (36.4 C)  TempSrc:   Oral Oral  SpO2:   100% 97%  Weight: 96 kg     Height:        Intake/Output Summary (Last 24 hours) at 04/25/2020 1607 Last data filed at 04/25/2020 1500 Gross per 24 hour  Intake 1260 ml  Output --  Net 1260 ml   Filed Weights   04/23/20 0654 04/24/20 0500 04/25/20 0500  Weight: 94.1 kg 95.4 kg 96 kg    Examination:  General: A/O x4, No acute respiratory distress Eyes: negative scleral hemorrhage, negative anisocoria, negative icterus ENT: Negative Runny nose, negative gingival bleeding, patient with extremely poor dentation cracked and broken teeth on both sides of mouth upper and  lower, swollen jaws tender to the touch warm to the touch. Neck:  Negative scars, masses, torticollis, lymphadenopathy, JVD Lungs: Clear to auscultation bilaterally without wheezes or crackles Cardiovascular: Regular rate and rhythm without murmur gallop or rub normal S1 and S2 Abdomen: negative abdominal pain, nondistended, positive soft, bowel sounds, no rebound, no ascites, no appreciable mass Extremities: No significant cyanosis, clubbing, or edema bilateral lower extremities Skin: Negative rashes, lesions, ulcers Psychiatric:  Negative depression, negative anxiety, negative fatigue, negative mania  Central nervous system:  Cranial nerves II through XII intact, tongue/uvula midline, all extremities muscle strength  5/5, sensation intact throughout, negative dysarthria, negative expressive aphasia, negative receptive aphasia.  .     Data Reviewed: Care during the described time interval was provided by me .  I have reviewed this patient's available data, including medical history, events of note, physical examination, and all test results as part of my evaluation.  CBC: Recent Labs  Lab 04/24/20 0856 04/25/20 0220  WBC 10.7* 8.7  NEUTROABS 5.8 4.0  HGB 10.7* 10.3*  HCT 33.7* 32.1*  MCV 67.5* 68.3*  PLT 209 205   Basic Metabolic Panel: Recent Labs  Lab 04/23/20 0155 04/24/20 0856 04/25/20 0220  NA 127* 125* 127*  K 4.2 4.1 4.5  CL 95* 91* 93*  CO2 26 25 25   GLUCOSE 94 109* 95  BUN 9 9 9   CREATININE 0.69 0.50* 0.60*  CALCIUM 9.0 9.1 8.9  MG  --  1.8 1.7  PHOS  --  4.5 4.0   GFR: Estimated Creatinine Clearance: 121.3 mL/min (A) (by C-G formula based on SCr of 0.6 mg/dL (L)). Liver Function Tests: Recent Labs  Lab 04/24/20 0856 04/25/20 0220  AST 24 37  ALT 31 32  ALKPHOS 200* 200*  BILITOT 0.5 0.6  PROT 7.0 6.8  ALBUMIN 3.6 3.4*   No results for input(s): LIPASE, AMYLASE in the last 168 hours. No results for input(s): AMMONIA in the last 168 hours. Coagulation Profile: No results for input(s): INR, PROTIME in the last 168 hours. Cardiac Enzymes: No results for input(s): CKTOTAL, CKMB, CKMBINDEX, TROPONINI in the last 168 hours. BNP (last 3 results) No results for input(s): PROBNP in the last 8760 hours. HbA1C: No results for input(s): HGBA1C in the last 72 hours. CBG: No results for input(s): GLUCAP in the last 168 hours. Lipid Profile: No results for input(s): CHOL, HDL, LDLCALC, TRIG, CHOLHDL, LDLDIRECT in the last 72 hours. Thyroid Function Tests: No results for input(s): TSH, T4TOTAL, FREET4, T3FREE, THYROIDAB in the last 72 hours. Anemia Panel: No results for input(s): VITAMINB12, FOLATE, FERRITIN, TIBC, IRON, RETICCTPCT in the last 72 hours. Sepsis  Labs: Recent Labs  Lab 04/24/20 1610 04/24/20 1848  LATICACIDVEN 0.7 1.1    No results found for this or any previous visit (from the past 240 hour(s)).       Radiology Studies: DG Orthopantogram  Result Date: 04/24/2020 CLINICAL DATA:  Follow-up periapical dental abscesses EXAM: ORTHOPANTOGRAM/PANORAMIC COMPARISON:  03/29/2020 FINDINGS: Multiple missing teeth are noted. There are dental caries involving many of the remaining teeth. Periapical lucencies of a RIGHT UPPER molar and of all non-incisor LOWER teeth are compatible with periapical periodontitis/abscesses. IMPRESSION: 1. Bilateral periapical periodontitis/abscesses as described above. 2. Multiple missing teeth and dental caries. Electronically Signed   By: 04/26/2020 M.D.   On: 04/24/2020 17:02        Scheduled Meds: . atorvastatin  40 mg Oral Daily  . busPIRone  10 mg Oral TID  . carvedilol  3.125 mg Oral BID WC  . clonazePAM  0.25 mg Oral Daily  . digoxin  0.125 mg Oral Daily  . enoxaparin (LOVENOX) injection  40 mg Subcutaneous Daily  . fluticasone furoate-vilanterol  1 puff Inhalation Daily  . furosemide  60 mg Oral Daily  . phenytoin  300 mg Oral BID  . pregabalin  50 mg Oral TID  . QUEtiapine  200 mg Oral Daily  . sacubitril-valsartan  1 tablet Oral BID   Continuous Infusions: . ampicillin-sulbactam (UNASYN) IV Stopped (04/25/20 1243)     LOS: 62 days    Time spent:40 min    Antwann Preziosi, Roselind Messier, MD [facility] Pager [phone]  If 7PM-7AM, please contact night-coverage [email or web address] Password [facility] 04/25/2020, 4:07 PM

## 2020-04-26 LAB — CBC WITH DIFFERENTIAL/PLATELET
Abs Immature Granulocytes: 0.05 10*3/uL (ref 0.00–0.07)
Basophils Absolute: 0 10*3/uL (ref 0.0–0.1)
Basophils Relative: 1 %
Eosinophils Absolute: 0.3 10*3/uL (ref 0.0–0.5)
Eosinophils Relative: 3 %
HCT: 33.4 % — ABNORMAL LOW (ref 39.0–52.0)
Hemoglobin: 10.6 g/dL — ABNORMAL LOW (ref 13.0–17.0)
Immature Granulocytes: 1 %
Lymphocytes Relative: 37 %
Lymphs Abs: 2.9 10*3/uL (ref 0.7–4.0)
MCH: 21.7 pg — ABNORMAL LOW (ref 26.0–34.0)
MCHC: 31.7 g/dL (ref 30.0–36.0)
MCV: 68.4 fL — ABNORMAL LOW (ref 80.0–100.0)
Monocytes Absolute: 1.1 10*3/uL — ABNORMAL HIGH (ref 0.1–1.0)
Monocytes Relative: 14 %
Neutro Abs: 3.6 10*3/uL (ref 1.7–7.7)
Neutrophils Relative %: 44 %
Platelets: 217 10*3/uL (ref 150–400)
RBC: 4.88 MIL/uL (ref 4.22–5.81)
RDW: 23 % — ABNORMAL HIGH (ref 11.5–15.5)
WBC: 8 10*3/uL (ref 4.0–10.5)
nRBC: 0 % (ref 0.0–0.2)

## 2020-04-26 LAB — MAGNESIUM: Magnesium: 2.1 mg/dL (ref 1.7–2.4)

## 2020-04-26 LAB — COMPREHENSIVE METABOLIC PANEL
ALT: 32 U/L (ref 0–44)
AST: 30 U/L (ref 15–41)
Albumin: 3.6 g/dL (ref 3.5–5.0)
Alkaline Phosphatase: 209 U/L — ABNORMAL HIGH (ref 38–126)
Anion gap: 8 (ref 5–15)
BUN: 10 mg/dL (ref 6–20)
CO2: 26 mmol/L (ref 22–32)
Calcium: 9 mg/dL (ref 8.9–10.3)
Chloride: 94 mmol/L — ABNORMAL LOW (ref 98–111)
Creatinine, Ser: 0.55 mg/dL — ABNORMAL LOW (ref 0.61–1.24)
GFR calc Af Amer: >60 mL/min (ref >60)
GFR calc non Af Amer: >60 mL/min (ref >60)
Glucose, Bld: 100 mg/dL — ABNORMAL HIGH (ref 70–99)
Potassium: 4.2 mmol/L (ref 3.5–5.1)
Sodium: 128 mmol/L — ABNORMAL LOW (ref 135–145)
Total Bilirubin: 0.3 mg/dL (ref 0.3–1.2)
Total Protein: 7.1 g/dL (ref 6.5–8.1)

## 2020-04-26 LAB — PHOSPHORUS: Phosphorus: 4.6 mg/dL (ref 2.5–4.6)

## 2020-04-26 NOTE — Progress Notes (Signed)
CPAP at bedside. Patient stated he would place himself on when he is ready. RT instructed patient to have RT called if assistance is needed. RT will monitor as needed.

## 2020-04-26 NOTE — Progress Notes (Signed)
Due to patient constantly pulling IV out restarted IV and place Kling around it to secure it. Site intace.

## 2020-04-26 NOTE — Progress Notes (Signed)
[facility] PROGRESS NOTE  Jim Dunn FXT:[id number] DOB: 1965/12/07 DOA: 02/23/2020 PCP: System, Pcp Not In  Status: Inpatient---Remains inpatient appropriate because:Altered mental status and Unsafe d/c plan   Dispo: The patient is from: Home              Anticipated d/c is to: Group home              Anticipated d/c date is: > 3 days              Patient currently is not medically stable to d/c. **NEEDS SAFE DC PLAN** Currently awaiting placement, TOC team working on this.  Mother is currently working to obtain Medicaid/guardianship. Unsafe for discharge based on underlying schizophrenia and history of recurrent noncompliance which led to patient's current hospitalization  Discussed with LCSW/case management. Mother presented to hospital on 8/17 to work with patient regarding Medicaid application.  She also obtained a completed FL2 form which was requested by [facility].  Code Status: Full Family Communication: 8/17 updated [name] by telephone HIPPA appropriate VM left [ [phone] or [phone]] DVT prophylaxis: 8/5 refused Lovenox therefore switched to SCDs since ambulating without difficulty Vaccination status: Vaccinated   HPI (time of admission): 54 year old male with past medical history of schizophrenia, seizure disorder, biventricular heart failure(Echo 01/2020 EF 25-30%with right-sided heart failure),severe tricuspid regurgitation, nicotine dependence and COPD who presents to [facility] emergency department with suicidal ideation and seizure.Patient explains that since he left the hospital several days ago(admitted6/17-6/19-was being managed for hyponatremia, thought to be multifactorial in origin. Patient left AMA on6/19 and that time had sodium of 132),he has been experiencing increasing fatigue. This increasing fatigue is associated with bilateral lower extremity tingling. Symptomsweresimilar to what prompted his presentation on  6/17. Additionally, he reported feeling depressed withsuicidal ideation, withouta plan. Patient reportedthat he hadattempted suicide in the distant past using pills but was unsuccessful and never sought medical attention. While the patient was waiting in the waiting room, he experienced a witnessed seizure while attempting to walk to the bathroom. Patient was immediately brought back to the emergency department for further evaluation. Patient was found to have substantial recurrent hyponatremia of 117 with concurrent hypokalemia of 2.6 and hypomagnesium of 1.4. Patient was administered 1236mg  of intravenous Fosphenytoinand initiated on low rate normal saline infusion. Patient was administered 2 g of intravenous magnesium and a 30 mEq of potassium.  Patient's electrolytes have improved, no further seizure episodes at this time.  Echo showed EF of 25-30%, cardiology recommended outpatient follow-up but otherwise continue Coreg, torsemide and Entresto.    Subjective: Laying in bed.  Reporting right posterior molar pain.  Some difficulty chewing secondary to pain.  Objective: Vitals:   04/26/20 0836 04/26/20 0915  BP:  128/81  Pulse: 92 83  Resp: 16 18  Temp:  98.3 F (36.8 C)  SpO2: 96% 100%    Intake/Output Summary (Last 24 hours) at 04/26/2020 1258 Last data filed at 04/26/2020 0500 Gross per 24 hour  Intake 706 ml  Output 1300 ml  Net -594 ml   Filed Weights   04/23/20 0654 04/24/20 0500 04/25/20 0500  Weight: 94.1 kg 95.4 kg 96 kg    Exam:  Constitutional: NAD, calm, comfortable Respiratory: clear to auscultation bilaterally on posterior exam, Normal respiratory effort at rest.  Room air Cardiovascular: Regular rate, no murmurs / rubs / gallops. No lower extremity edema.  No JVD.  Abdomen: no tenderness, Bowel sounds positive.    Neurologic: CN 2-12  grossly intact. Sensation intact,Strength 5/5 x all 4 extremities. Ambulates independently Psychiatric: Alert and oriented  x 3 although does not have appropriate insight or capacity to manage care independently   Assessment/Plan: Lip edema/dental caries with abscess Recurrent.  Initial episode in July that responded to IV antibiotics.  Dental caries and periapical abscess confirmed orthopantogram Had recurrence of her this weekend on 8/21 with again imaging confirming periapical periodontitis with abscesses involving the right upper molar and all of the nonincisor lower teeth Previously responded well to IV Unasyn so this was resumed on 8/21-WBC 8.0 Have requested formal dental consultation with Dr. Kristin Bruins.  Anticipate inpatient dental extraction  Hyponatremia with seizures: Intravascular dehydration, resolved At time of admission diuretics torsemide & metolazone were held and subsequently  discontinued.   As of 8/9 Na+ down to 127 and further decreased to 126 initially went up to 28 but as of 8/12 after acute treatment for asymptomatic volume overload Weight had increased to 210.1 lbs and in positive balance I/O therefore associated decreased sodium suspected volume overload-below regarding treatment regimen 8/13 after diuresis sodium has decreased to 122 therefore Lasix placed on hold for 48 hours  8/23 Lasix has been resumed for multiple days and sodium remained stable at 8  Chronic congestive heart failure with reduced ejection fraction, 25%.-Associated right heart failure.   Recently treated for 2 days with twice daily Lasix dosing in context of asymptomatic heart failure exacerbation/weight gain. Na+ subsequently decreased to 126 and weight decreased from 210.1 lbs to 206.8 lbs-Lasix decreased to daily but baseline dosage increased from 40 mg to 60 mg -see above regarding sodium Echo May 2021-EF 25-30% Continue Coreg, Entresto, Daily weights with strict I/O-having excellent urinary output with current Lasix dosage 60 mg daily Counseled extensively by multiple providers regarding need to adhere to salt  restricted diet  May need to obtain cardiology consultation to clear for possible dental surgery-defer to Dr. Kristin Bruins to make that recommendation  Depression, Suicidal ideations: Resolved, seen by psychiatry.  Recommended to continue BuSpar, Neurontin, Invega.   Follow-up outpatient psychiatry.   Received his routine Invega shot 8/8, takes this monthly-next dose due 9/8  Seizure disorder: Stable Secondary to hyponatremia and issues of nonadherence with medications prior to admission.   Dilantin level subtherapeutic on 7/18 at 2.6 - Dilantin dosage increased to 200 mg twice daily-repeat level on 7/25 also subtherapeutic to therefore dosage increased at recommendation of pharmacy to 200 in the morning and 300 at bedtime Repeat Dilantin level 7/30 has increased but still remains low at 6.4-discussed with pharmacist and have increased a.m. dose from 200 to 300 mg; repeat Dilantin level 8.7 on 8/4- 8/11 level now therapeutic at 11.7 so continue current dose  Microcytic anemia Iron studies relatively normal, hemoglobin stable  OSA Continue nocturnal CPAP-since mask changed compliance has improved  COPD: stable. As needed bronchodilators  Hyperlipidemia: Daily Lipitor 40 mg  Hypokalemia/hypomagnesemia Replace as needed  Prolonged QTC:  Appears to have resolved.   Can you Seroquel 200 mg daily.  Peripheral neuropathy Change gabapentin 300 mg QID to Lyrica 50 mg TID Repeat B12 level  Modest obesity Estimated body mass index is 32.18 kg/m as calculated from the following:   Height as of this encounter: 5\' 8"  (1.727 m).   Weight as of this encounter: 96 kg.      Data Reviewed: Basic Metabolic Panel: Recent Labs  Lab 04/23/20 0155 04/24/20 0856 04/25/20 0220 04/26/20 0217  NA 127* 125* 127* 128*  K 4.2 4.1 4.5  4.2  CL 95* 91* 93* 94*  CO2 26 25 25 26   GLUCOSE 94 109* 95 100*  BUN 9 9 9 10   CREATININE 0.69 0.50* 0.60* 0.55*  CALCIUM 9.0 9.1 8.9 9.0  MG  --   1.8 1.7 2.1  PHOS  --  4.5 4.0 4.6   Liver Function Tests: Recent Labs  Lab 04/24/20 0856 04/25/20 0220 04/26/20 0217  AST 24 37 30  ALT 31 32 32  ALKPHOS 200* 200* 209*  BILITOT 0.5 0.6 0.3  PROT 7.0 6.8 7.1  ALBUMIN 3.6 3.4* 3.6   No results for input(s): LIPASE, AMYLASE in the last 168 hours. No results for input(s): AMMONIA in the last 168 hours. CBC: Recent Labs  Lab 04/24/20 0856 04/25/20 0220 04/26/20 0217  WBC 10.7* 8.7 8.0  NEUTROABS 5.8 4.0 3.6  HGB 10.7* 10.3* 10.6*  HCT 33.7* 32.1* 33.4*  MCV 67.5* 68.3* 68.4*  PLT 209 205 217   Cardiac Enzymes: No results for input(s): CKTOTAL, CKMB, CKMBINDEX, TROPONINI in the last 168 hours. BNP (last 3 results) Recent Labs    01/22/20 0410 02/24/20 0030  BNP 697.9* 184.7*    ProBNP (last 3 results) No results for input(s): PROBNP in the last 8760 hours.  CBG: No results for input(s): GLUCAP in the last 168 hours.  No results found for this or any previous visit (from the past 240 hour(s)).   Studies: DG Orthopantogram  Result Date: 04/24/2020 CLINICAL DATA:  Follow-up periapical dental abscesses EXAM: ORTHOPANTOGRAM/PANORAMIC COMPARISON:  03/29/2020 FINDINGS: Multiple missing teeth are noted. There are dental caries involving many of the remaining teeth. Periapical lucencies of a RIGHT UPPER molar and of all non-incisor LOWER teeth are compatible with periapical periodontitis/abscesses. IMPRESSION: 1. Bilateral periapical periodontitis/abscesses as described above. 2. Multiple missing teeth and dental caries. Electronically Signed   By: Harmon Pier M.D.   On: 04/24/2020 17:02    Scheduled Meds:  atorvastatin  40 mg Oral Daily   busPIRone  10 mg Oral TID   carvedilol  3.125 mg Oral BID WC   clonazePAM  0.25 mg Oral Daily   digoxin  0.125 mg Oral Daily   enoxaparin (LOVENOX) injection  40 mg Subcutaneous Daily   fluticasone furoate-vilanterol  1 puff Inhalation Daily   furosemide  60 mg Oral Daily    phenytoin  300 mg Oral BID   pregabalin  50 mg Oral TID   QUEtiapine  200 mg Oral Daily   sacubitril-valsartan  1 tablet Oral BID   Continuous Infusions:  ampicillin-sulbactam (UNASYN) IV 3 g (04/26/20 0903)    Principal Problem:   Hyponatremia Active Problems:   Chronic systolic CHF (congestive heart failure) (HCC)   Mixed hyperlipidemia   COPD (chronic obstructive pulmonary disease) (HCC)   Seizure disorder (HCC)   Paranoid schizophrenia (HCC)   Hypokalemia   Hypomagnesemia   Suicidal ideation   Noncompliance with medications   Subtherapeutic serum dilantin level   Seizure (HCC)        Consultants:  Dentist/oral surgeon  Procedures:  None  Antibiotics: Anti-infectives (From admission, onward)   Start     Dose/Rate Route Frequency Ordered Stop   04/24/20 2100  Ampicillin-Sulbactam (UNASYN) 3 g in sodium chloride 0.9 % 100 mL IVPB        3 g 200 mL/hr over 30 Minutes Intravenous Every 6 hours 04/24/20 1936     04/02/20 2200  amoxicillin-clavulanate (AUGMENTIN) 875-125 MG per tablet 1 tablet  1 tablet Oral Every 12 hours 04/02/20 1405 04/04/20 2130   03/29/20 0930  Ampicillin-Sulbactam (UNASYN) 3 g in sodium chloride 0.9 % 100 mL IVPB  Status:  Discontinued        3 g 200 mL/hr over 30 Minutes Intravenous Every 6 hours 03/29/20 0915 04/02/20 1405       Time spent: 25 minutes    Junious Silk ANP  [facility] Pager [phone]. If 7PM-7AM, please contact night-coverage at [email or web address] 04/26/2020, 12:58 PM  LOS: 63 days

## 2020-04-27 ENCOUNTER — Encounter (HOSPITAL_COMMUNITY): Payer: Self-pay | Admitting: Internal Medicine

## 2020-04-27 ENCOUNTER — Telehealth (HOSPITAL_COMMUNITY): Payer: Self-pay | Admitting: Dentistry

## 2020-04-27 LAB — COMPREHENSIVE METABOLIC PANEL
ALT: 32 U/L (ref 0–44)
AST: 28 U/L (ref 15–41)
Albumin: 3.9 g/dL (ref 3.5–5.0)
Alkaline Phosphatase: 204 U/L — ABNORMAL HIGH (ref 38–126)
Anion gap: 9 (ref 5–15)
BUN: 6 mg/dL (ref 6–20)
CO2: 25 mmol/L (ref 22–32)
Calcium: 9.6 mg/dL (ref 8.9–10.3)
Chloride: 95 mmol/L — ABNORMAL LOW (ref 98–111)
Creatinine, Ser: 0.56 mg/dL — ABNORMAL LOW (ref 0.61–1.24)
GFR calc Af Amer: >60 mL/min (ref >60)
GFR calc non Af Amer: >60 mL/min (ref >60)
Glucose, Bld: 111 mg/dL — ABNORMAL HIGH (ref 70–99)
Potassium: 4.1 mmol/L (ref 3.5–5.1)
Sodium: 129 mmol/L — ABNORMAL LOW (ref 135–145)
Total Bilirubin: 0.6 mg/dL (ref 0.3–1.2)
Total Protein: 7.5 g/dL (ref 6.5–8.1)

## 2020-04-27 LAB — CBC WITH DIFFERENTIAL/PLATELET
Abs Immature Granulocytes: 0.06 10*3/uL (ref 0.00–0.07)
Basophils Absolute: 0.1 10*3/uL (ref 0.0–0.1)
Basophils Relative: 1 %
Eosinophils Absolute: 0.3 10*3/uL (ref 0.0–0.5)
Eosinophils Relative: 4 %
HCT: 37 % — ABNORMAL LOW (ref 39.0–52.0)
Hemoglobin: 11.7 g/dL — ABNORMAL LOW (ref 13.0–17.0)
Immature Granulocytes: 1 %
Lymphocytes Relative: 41 %
Lymphs Abs: 2.8 10*3/uL (ref 0.7–4.0)
MCH: 22.1 pg — ABNORMAL LOW (ref 26.0–34.0)
MCHC: 31.6 g/dL (ref 30.0–36.0)
MCV: 69.8 fL — ABNORMAL LOW (ref 80.0–100.0)
Monocytes Absolute: 0.8 10*3/uL (ref 0.1–1.0)
Monocytes Relative: 12 %
Neutro Abs: 2.8 10*3/uL (ref 1.7–7.7)
Neutrophils Relative %: 41 %
Platelets: 228 10*3/uL (ref 150–400)
RBC: 5.3 MIL/uL (ref 4.22–5.81)
RDW: 23.2 % — ABNORMAL HIGH (ref 11.5–15.5)
WBC: 6.7 10*3/uL (ref 4.0–10.5)
nRBC: 0 % (ref 0.0–0.2)

## 2020-04-27 LAB — MAGNESIUM: Magnesium: 1.7 mg/dL (ref 1.7–2.4)

## 2020-04-27 LAB — PHOSPHORUS: Phosphorus: 4.7 mg/dL — ABNORMAL HIGH (ref 2.5–4.6)

## 2020-04-27 MED ORDER — CEFAZOLIN SODIUM-DEXTROSE 2-4 GM/100ML-% IV SOLN
2.0000 g | INTRAVENOUS | Status: AC
  Administered 2020-04-29: 2 g via INTRAVENOUS

## 2020-04-27 MED ORDER — PREGABALIN 75 MG PO CAPS
75.0000 mg | ORAL_CAPSULE | Freq: Three times a day (TID) | ORAL | Status: DC
  Administered 2020-04-27 – 2020-06-08 (×123): 75 mg via ORAL
  Filled 2020-04-27 (×124): qty 1

## 2020-04-27 NOTE — Progress Notes (Signed)
RT set up CPAP at patient's bedside.  Patient stated he would place himself on CPAP when he is ready.  RT informed patient to have RT called if assistance is needed. RT will monitor as needed.

## 2020-04-27 NOTE — Progress Notes (Addendum)
[facility] PROGRESS NOTE  Boone Gear SNK:[id number] DOB: 21-Apr-1966 DOA: 02/23/2020 PCP: System, Pcp Not In  Status: Inpatient---Remains inpatient appropriate because:Altered mental status and Unsafe d/c plan   Dispo: The patient is from: Home              Anticipated d/c is to: Group home              Anticipated d/c date is: > 3 days              Patient currently is not medically stable to d/c. **NEEDS SAFE DC PLAN** Currently awaiting placement, TOC team working on this.  Mother is currently working to obtain Medicaid/guardianship. Unsafe for discharge based on underlying schizophrenia and history of recurrent noncompliance which led to patient's current hospitalization  Discussed with LCSW/case management. Mother presented to hospital on 8/17 to work with patient regarding Medicaid application.  She also obtained a completed FL2 form which was requested by [facility].  Code Status: Full Family Communication: 8/24 updated [name] by telephone HIPPA appropriate VM left [ [phone] or [phone]] DVT prophylaxis: 8/5 refused Lovenox therefore switched to SCDs since ambulating without difficulty Vaccination status: Vaccinated   HPI (time of admission): 54 year old male with past medical history of schizophrenia, seizure disorder, biventricular heart failure(Echo 01/2020 EF 25-30%with right-sided heart failure),severe tricuspid regurgitation, nicotine dependence and COPD who presents to [facility] emergency department with suicidal ideation and seizure.Patient explains that since he left the hospital several days ago(admitted6/17-6/19-was being managed for hyponatremia, thought to be multifactorial in origin. Patient left AMA on6/19 and that time had sodium of 132),he has been experiencing increasing fatigue. This increasing fatigue is associated with bilateral lower extremity tingling. Symptomsweresimilar to what prompted his presentation on  6/17. Additionally, he reported feeling depressed withsuicidal ideation, withouta plan. Patient reportedthat he hadattempted suicide in the distant past using pills but was unsuccessful and never sought medical attention. While the patient was waiting in the waiting room, he experienced a witnessed seizure while attempting to walk to the bathroom. Patient was immediately brought back to the emergency department for further evaluation. Patient was found to have substantial recurrent hyponatremia of 117 with concurrent hypokalemia of 2.6 and hypomagnesium of 1.4. Patient was administered 1236mg  of intravenous Fosphenytoinand initiated on low rate normal saline infusion. Patient was administered 2 g of intravenous magnesium and a 30 mEq of potassium.  Patient's electrolytes have improved, no further seizure episodes at this time.  Echo showed EF of 25-30%, cardiology recommended outpatient follow-up but otherwise continue Coreg, torsemide and Entresto.    Subjective: Sitting up in chair without complaints verbalized.  When questioned about CPAP use overnight stated he wore it some time.  Continues to have right lower jaw dental pain.  Objective: Vitals:   04/27/20 0822 04/27/20 0920  BP:  128/83  Pulse:  74  Resp:    Temp:    SpO2: 96%     Intake/Output Summary (Last 24 hours) at 04/27/2020 1025 Last data filed at 04/27/2020 1000 Gross per 24 hour  Intake 1260 ml  Output 600 ml  Net 660 ml   Filed Weights   04/24/20 0500 04/25/20 0500 04/27/20 0626  Weight: 95.4 kg 96 kg 94.4 kg    Exam:  Constitutional: NAD, calm, comfortable Oral: Continues to have extremely poor dentition.  No obvious visible inflammation or drainage. Respiratory: clear to auscultation bilaterally on posterior exam, Normal respiratory effort at rest.  Room air Cardiovascular: Regular rate, no murmurs /  rubs / gallops. No lower extremity edema.  No JVD.  Abdomen: no tenderness, Bowel sounds positive.     Neurologic: CN 2-12 grossly intact. Sensation intact,Strength 5/5 x all 4 extremities. Ambulates independently Psychiatric: Alert and oriented x 3 although does not have appropriate insight or capacity to manage care independently   Assessment/Plan: Lip edema/dental caries with abscess (recurrent) Recurrent.  Initial episode in July that responded to IV antibiotics.  Dental caries and periapical abscess confirmed orthopantogram Had recurrence of her this weekend on 8/21 with again imaging confirming periapical periodontitis with abscesses involving the right upper molar and all of the nonincisor lower teeth Previously responded well to IV Unasyn so this was resumed on 8/21-WBC 8.0 Appreciate assistance of Dr. Kristin Bruins.  Plan for full dental extraction 8/26  Hyponatremia with seizures: Intravascular dehydration, resolved At time of admission diuretics torsemide & metolazone were held and subsequently  discontinued.   As of 8/9 Na+ down to 127 and further decreased to 126 initially went up to 28 but as of 8/12 after acute treatment for asymptomatic volume overload Weight had increased to 210.1 lbs and in positive balance I/O therefore associated decreased sodium suspected volume overload-below regarding treatment regimen 8/13 after diuresis sodium has decreased to 122 therefore Lasix placed on hold for 48 hours  8/23 Lasix has been resumed for multiple days and sodium remained stable at 128  Chronic congestive heart failure with reduced ejection fraction, 25%.-Associated right heart failure.   Recently treated for 2 days with twice daily Lasix dosing in context of asymptomatic heart failure exacerbation/weight gain. Na+ subsequently decreased to 126 and weight decreased from 210.1 lbs to 206.8 lbs-Lasix decreased to daily but baseline dosage increased from 40 mg to 60 mg -see above regarding sodium Echo May 2021-EF 25-30% Continue Coreg, Entresto, Daily weights with strict I/O-having excellent  urinary output with current Lasix dosage 60 mg daily Counseled extensively by multiple providers regarding need to adhere to salt restricted diet  Cardiac clearance requested by dental service-Dr. Sharyn Lull consulted and has cleared pt for surgery  Depression, Suicidal ideations: Resolved, seen by psychiatry.  Recommended to continue BuSpar, Neurontin, Invega.   Follow-up outpatient psychiatry.   Received his routine Invega shot 8/8, takes this monthly-next dose due 9/8  Seizure disorder: Stable Secondary to hyponatremia and issues of nonadherence with medications prior to admission.   Dilantin level subtherapeutic on 7/18 at 2.6 - Dilantin dosage increased to 200 mg twice daily-repeat level on 7/25 also subtherapeutic to therefore dosage increased at recommendation of pharmacy to 200 in the morning and 300 at bedtime Repeat Dilantin level 7/30 has increased but still remains low at 6.4-discussed with pharmacist and have increased a.m. dose from 200 to 300 mg; repeat Dilantin level 8.7 on 8/4 -8/11 Dilantin level therapeutic at 11.7 -continue current dose  Microcytic anemia Iron studies relatively normal, hemoglobin stable  OSA Continue nocturnal CPAP-since mask changed compliance has improved  COPD: stable. As needed bronchodilators  Hyperlipidemia: Daily Lipitor 40 mg  Hypokalemia/hypomagnesemia Replace as needed  Prolonged QTC:  Appears to have resolved.   Can you Seroquel 200 mg daily.  Peripheral neuropathy Change gabapentin 300 mg QID to Lyrica 50 mg TID Repeat B12 level  Modest obesity Estimated body mass index is 31.66 kg/m as calculated from the following:   Height as of this encounter: 5\' 8"  (1.727 m).   Weight as of this encounter: 94.4 kg.      Data Reviewed: Basic Metabolic Panel: Recent Labs  Lab 04/23/20 0155  04/24/20 0856 04/25/20 0220 04/26/20 0217 04/27/20 0754  NA 127* 125* 127* 128* 129*  K 4.2 4.1 4.5 4.2 4.1  CL 95* 91* 93* 94*  95*  CO2 26 25 25 26 25   GLUCOSE 94 109* 95 100* 111*  BUN 9 9 9 10 6   CREATININE 0.69 0.50* 0.60* 0.55* 0.56*  CALCIUM 9.0 9.1 8.9 9.0 9.6  MG  --  1.8 1.7 2.1 1.7  PHOS  --  4.5 4.0 4.6 4.7*   Liver Function Tests: Recent Labs  Lab 04/24/20 0856 04/25/20 0220 04/26/20 0217 04/27/20 0754  AST 24 37 30 28  ALT 31 32 32 32  ALKPHOS 200* 200* 209* 204*  BILITOT 0.5 0.6 0.3 0.6  PROT 7.0 6.8 7.1 7.5  ALBUMIN 3.6 3.4* 3.6 3.9   No results for input(s): LIPASE, AMYLASE in the last 168 hours. No results for input(s): AMMONIA in the last 168 hours. CBC: Recent Labs  Lab 04/24/20 0856 04/25/20 0220 04/26/20 0217  WBC 10.7* 8.7 8.0  NEUTROABS 5.8 4.0 3.6  HGB 10.7* 10.3* 10.6*  HCT 33.7* 32.1* 33.4*  MCV 67.5* 68.3* 68.4*  PLT 209 205 217   Cardiac Enzymes: No results for input(s): CKTOTAL, CKMB, CKMBINDEX, TROPONINI in the last 168 hours. BNP (last 3 results) Recent Labs    01/22/20 0410 02/24/20 0030  BNP 697.9* 184.7*    ProBNP (last 3 results) No results for input(s): PROBNP in the last 8760 hours.  CBG: No results for input(s): GLUCAP in the last 168 hours.  No results found for this or any previous visit (from the past 240 hour(s)).   Studies: No results found.  Scheduled Meds: . atorvastatin  40 mg Oral Daily  . busPIRone  10 mg Oral TID  . carvedilol  3.125 mg Oral BID WC  . clonazePAM  0.25 mg Oral Daily  . digoxin  0.125 mg Oral Daily  . enoxaparin (LOVENOX) injection  40 mg Subcutaneous Daily  . fluticasone furoate-vilanterol  1 puff Inhalation Daily  . furosemide  60 mg Oral Daily  . phenytoin  300 mg Oral BID  . pregabalin  50 mg Oral TID  . QUEtiapine  200 mg Oral Daily  . sacubitril-valsartan  1 tablet Oral BID   Continuous Infusions: . ampicillin-sulbactam (UNASYN) IV 3 g (04/27/20 1021)    Principal Problem:   Hyponatremia Active Problems:   Chronic systolic CHF (congestive heart failure) (HCC)   Mixed hyperlipidemia   COPD  (chronic obstructive pulmonary disease) (HCC)   Seizure disorder (HCC)   Paranoid schizophrenia (HCC)   Hypokalemia   Hypomagnesemia   Suicidal ideation   Noncompliance with medications   Subtherapeutic serum dilantin level   Seizure (HCC)        Consultants:  Dentist/oral surgeon  Procedures:  None  Antibiotics: Anti-infectives (From admission, onward)   Start     Dose/Rate Route Frequency Ordered Stop   04/24/20 2100  Ampicillin-Sulbactam (UNASYN) 3 g in sodium chloride 0.9 % 100 mL IVPB        3 g 200 mL/hr over 30 Minutes Intravenous Every 6 hours 04/24/20 1936     04/02/20 2200  amoxicillin-clavulanate (AUGMENTIN) 875-125 MG per tablet 1 tablet        1 tablet Oral Every 12 hours 04/02/20 1405 04/04/20 2130   03/29/20 0930  Ampicillin-Sulbactam (UNASYN) 3 g in sodium chloride 0.9 % 100 mL IVPB  Status:  Discontinued        3 g 200 mL/hr over  30 Minutes Intravenous Every 6 hours 03/29/20 0915 04/02/20 1405       Time spent: 25 minutes    Junious Silk ANP  [facility] Pager [phone]. If 7PM-7AM, please contact night-coverage at [email or web address] 04/27/2020, 10:25 AM  LOS: 64 days

## 2020-04-27 NOTE — Telephone Encounter (Signed)
04/27/2020  Patient:            Jim Dunn Date of Birth:  1966-03-31 MRN:                [id number]  I called and talked with Alex Gardener, the mother, concerning the plan of care for the patient -Jim Dunn. We discussed the risks, benefits and complications of various treatment options and will proceed with extraction of remaining teeth with alveoloplasty and preprosthetic surgery as needed in the operating room with general anesthesia.  The operating room procedure has been scheduled for Thursday at 9 AM at [facility].  The mother is aware of all the risks associated with the procedure up to and including death and agrees with the plan of care at this time.  [name], DDS

## 2020-04-27 NOTE — Consult Note (Addendum)
DENTAL CONSULTATION  Date of Consultation:  04/27/2020 Patient Name:   Jim Dunn Date of Birth:   [birth year] Medical Record Number: [id number]  VITALS: BP 139/82 (BP Location: Left Arm)   Pulse 65   Temp 97.8 F (36.6 C) (Oral)   Resp 18   Ht 5\' 8"  (1.727 m)   Wt 94.4 kg   SpO2 90%   BMI 31.66 kg/m   CHIEF COMPLAINT: Patient referred by Dr. Carolyne Littles for dental consultation.  HPI: Jim Dunn is a 54 year old male admitted on 02/23/2020 for multiple medical problems.  Patient subsequently developed toothache symptoms.  Dental consultation requested to evaluate poor dentition and provide treatment as indicated.    Patient is complaining of lower right quadrant pain for the past 3 days.  Patient describes sharp as well as dull achy pain that last for hours at a time.  Pain has reached an intensity of 8 out of 10 at this time.  Patient last saw dentist approximately 2 to 3 years ago to have a dental extraction.  There were no complications from that dental extraction.  The extraction was performed by a dentist in [place], [place].  The patient denies having partial dentures.  Patient denies having dental phobia.  The patient is interested in getting" all my teeth pulled".   PROBLEM LIST: Patient Active Problem List   Diagnosis Date Noted  . Noncompliance with medications   . Subtherapeutic serum dilantin level   . Seizure (HCC)   . Hypomagnesemia 02/23/2020  . Suicidal ideation 02/23/2020  . Hyponatremia 02/19/2020  . Paranoid schizophrenia (HCC)   . Hypokalemia   . Chronic systolic CHF (congestive heart failure) (HCC) 01/22/2020  . Mixed hyperlipidemia 01/22/2020  . COPD (chronic obstructive pulmonary disease) (HCC) 01/22/2020  . Microcytic anemia 01/22/2020  . Chronic hyponatremia 01/22/2020  . Seizure disorder (HCC) 01/22/2020    PMH: Past Medical History:  Diagnosis Date  . CHF (congestive heart failure) (HCC)   . COPD (chronic obstructive pulmonary disease)  (HCC)   . History of kidney stones   . Paranoid schizophrenia (HCC)   . Seizures (HCC)   . Tobacco abuse     PSH: Past Surgical History:  Procedure Laterality Date  . KIDNEY STONE SURGERY      ALLERGIES: No Known Allergies  MEDICATIONS: Current Facility-Administered Medications  Medication Dose Route Frequency Provider Last Rate Last Admin  . acetaminophen (TYLENOL) tablet 650 mg  650 mg Oral Q4H PRN Marinda Elk, MD   650 mg at 04/24/20 2149  . Ampicillin-Sulbactam (UNASYN) 3 g in sodium chloride 0.9 % 100 mL IVPB  3 g Intravenous Q6H Earnie Larsson, RPH 200 mL/hr at 04/27/20 0347 3 g at 04/27/20 0347  . atorvastatin (LIPITOR) tablet 40 mg  40 mg Oral Daily Shalhoub, Deno Lunger, MD   40 mg at 04/26/20 0858  . busPIRone (BUSPAR) tablet 10 mg  10 mg Oral TID Marinda Elk, MD   10 mg at 04/26/20 2149  . carvedilol (COREG) tablet 3.125 mg  3.125 mg Oral BID WC Shalhoub, Deno Lunger, MD   3.125 mg at 04/26/20 1614  . clonazePAM (KLONOPIN) disintegrating tablet 0.25 mg  0.25 mg Oral Daily Kathlen Mody, MD   0.25 mg at 04/26/20 0859  . digoxin (LANOXIN) tablet 0.125 mg  0.125 mg Oral Daily Shalhoub, Deno Lunger, MD   0.125 mg at 04/26/20 0859  . enoxaparin (LOVENOX) injection 40 mg  40 mg Subcutaneous Daily Shalhoub, Deno Lunger, MD  40 mg at 04/21/20 0902  . fluticasone furoate-vilanterol (BREO ELLIPTA) 200-25 MCG/INH 1 puff  1 puff Inhalation Daily Shalhoub, Deno Lunger, MD   1 puff at 04/26/20 0836  . furosemide (LASIX) tablet 60 mg  60 mg Oral Daily Russella Dar, NP   60 mg at 04/26/20 0859  . ipratropium-albuterol (DUONEB) 0.5-2.5 (3) MG/3ML nebulizer solution 3 mL  3 mL Nebulization Q4H PRN Shalhoub, Deno Lunger, MD      . ondansetron [facility]) tablet 4 mg  4 mg Oral Q6H PRN Shalhoub, Deno Lunger, MD       Or  . ondansetron [facility]) injection 4 mg  4 mg Intravenous Q6H PRN Shalhoub, Deno Lunger, MD      . phenytoin (DILANTIN) ER capsule 300 mg  300 mg Oral BID Russella Dar, NP   300  mg at 04/26/20 2149  . polyethylene glycol (MIRALAX / GLYCOLAX) packet 17 g  17 g Oral Daily PRN Marinda Elk, MD   17 g at 04/02/20 2102  . pregabalin (LYRICA) capsule 50 mg  50 mg Oral TID Russella Dar, NP   50 mg at 04/26/20 2149  . QUEtiapine (SEROQUEL) tablet 200 mg  200 mg Oral Daily Burnadette Pop, MD   200 mg at 04/26/20 0858  . sacubitril-valsartan (ENTRESTO) 24-26 mg per tablet  1 tablet Oral BID Marinda Elk, MD   1 tablet at 04/26/20 2150  . zolpidem (AMBIEN) tablet 5 mg  5 mg Oral QHS PRN Edsel Petrin, DO   5 mg at 04/26/20 2150    LABS: Lab Results  Component Value Date   WBC 8.0 04/26/2020   HGB 10.6 (L) 04/26/2020   HCT 33.4 (L) 04/26/2020   MCV 68.4 (L) 04/26/2020   PLT 217 04/26/2020      Component Value Date/Time   NA 128 (L) 04/26/2020 0217   K 4.2 04/26/2020 0217   CL 94 (L) 04/26/2020 0217   CO2 26 04/26/2020 0217   GLUCOSE 100 (H) 04/26/2020 0217   BUN 10 04/26/2020 0217   CREATININE 0.55 (L) 04/26/2020 0217   CALCIUM 9.0 04/26/2020 0217   GFRNONAA >60 04/26/2020 0217   GFRAA >60 04/26/2020 0217   Lab Results  Component Value Date   INR 1.1 02/19/2020   No results found for: PTT  SOCIAL HISTORY: Social History   Socioeconomic History  . Marital status: Single    Spouse name: Not on file  . Number of children: Not on file  . Years of education: Not on file  . Highest education level: Not on file  Occupational History  . Not on file  Tobacco Use  . Smoking status: Current Every Day Smoker    Packs/day: 1.00    Types: Cigarettes  . Smokeless tobacco: Current User    Types: Chew  Vaping Use  . Vaping Use: Never used  Substance and Sexual Activity  . Alcohol use: Not Currently  . Drug use: Never  . Sexual activity: Not on file  Other Topics Concern  . Not on file  Social History Narrative  . Not on file   Social Determinants of Health   Financial Resource Strain:   . Difficulty of Paying Living Expenses: Not on  file  Food Insecurity:   . Worried About Programme researcher, broadcasting/film/video in the Last Year: Not on file  . Ran Out of Food in the Last Year: Not on file  Transportation Needs:   . Lack of Transportation (Medical): Not  on file  . Lack of Transportation (Non-Medical): Not on file  Physical Activity:   . Days of Exercise per Week: Not on file  . Minutes of Exercise per Session: Not on file  Stress:   . Feeling of Stress : Not on file  Social Connections:   . Frequency of Communication with Friends and Family: Not on file  . Frequency of Social Gatherings with Friends and Family: Not on file  . Attends Religious Services: Not on file  . Active Member of Clubs or Organizations: Not on file  . Attends Banker Meetings: Not on file  . Marital Status: Not on file  Intimate Partner Violence:   . Fear of Current or Ex-Partner: Not on file  . Emotionally Abused: Not on file  . Physically Abused: Not on file  . Sexually Abused: Not on file    FAMILY HISTORY: Family History  Problem Relation Age of Onset  . Heart disease Other     REVIEW OF SYSTEMS: Reviewed with the patient as per History of present illness. Psych: Patient denies having dental phobia.  DENTAL HISTORY: CHIEF COMPLAINT: Patient referred by Dr. Carolyne Littles for dental consultation.  HPI: Jim Dunn is a 54 year old male admitted on 02/23/2020 for multiple medical problems.  Patient subsequently developed toothache symptoms.  Dental consultation requested to evaluate poor dentition and provide treatment as indicated.    Patient is complaining of lower right quadrant pain for the past 3 days.  Patient describes sharp as well as dull achy pain that last for hours at a time.  Pain has reached an intensity of 8 out of 10 at this time.  Patient last saw dentist approximately 2 to 3 years ago to have a dental extraction.  There were no complications from that dental extraction.  The extraction was performed by a dentist in [place],  [place].  The patient denies having partial dentures.  Patient denies having dental phobia.  The patient is interested in getting" all my teeth pulled".   DENTAL EXAMINATION: GENERAL: The patient is a well-developed, well-nourished male in no acute distress. HEAD AND NECK: There is some slight right neck lymphadenopathy.  There is no facial swelling.  Patient denies acute TMJ symptoms. INTRAORAL EXAM: Patient has normal saliva.  There is no evidence of oral abscess formation. DENTITION: Patient with multiple missing teeth and multiple retained root segments. PERIODONTAL: Patient has chronic periodontitis with plaque calculus accumulations, gingival recession, moderate bone loss noted.  Tooth mobility is noted. DENTAL CARIES/SUBOPTIMAL RESTORATIONS: Multiple dental caries are noted. ENDODONTIC: Patient is complaining of acute vulvitis symptoms.  There are multiple areas of periapical pathology and radiolucency. CROWN AND BRIDGE: There are no crown bridge restorations. PROSTHODONTIC: Patient denies having any partial dentures. OCCLUSION: Patient has a poor occlusal scheme and malocclusion secondary to multiple missing teeth, multiple retained root segments, and lack replacing of the missing teeth with dental prostheses.  RADIOGRAPHIC INTERPRETATION: An orthopantogram was taken on 03/09/2020 and 04/24/2020. There are multiple missing teeth.  There are multiple retained root segments.  There are multiple areas of periapical pathology and radiolucency.  There is supra eruption drifting of the unopposed teeth into the edentulous areas.  There is moderate to severe bone loss.  Multiple dental caries.   ASSESSMENTS: 1.  Chronic systolic congestive heart failure 2.  Paranoid schizophrenia 3.  History of acute pulpitis 4.  Chronic apical periodontitis 5.  Multiple retained root segments 6.  Rampant dental caries 7.  Multiple missing teeth  8.  Chronic periodontitis with bone loss 9.  Gingival  recession 10.  Accretions 11. Tooth mobility 12.  No history of partial dentures 13.  Poor occlusal scheme and malocclusion 14.  Risk for complications with anticipated invasive dental procedures in the operating with general anesthesia up to and including death due to his overall cardiovascular compromise. 13.  Risk of bleeding with invasive dental procedures due to current Lovenox therapy.  PLAN/RECOMMENDATIONS: 1. I discussed the risks, benefits, and complications of various treatment options with the patient in relationship to his medical and dental conditions, specifically chronic systolic congestive heart failure and paranoid schizophrenia. We discussed various treatment options to include no treatment, multiple extractions with alveoloplasty, pre-prosthetic surgery as indicated, periodontal therapy, dental restorations, root canal therapy, crown and bridge therapy, implant therapy, and replacement of missing teeth as indicated. The patient currently wishes to proceed with extraction of remaining teeth with alveoloplasty and preprosthetic surgery as needed in the operating room with general anesthesia.  We will need to have cardiac clearance prior to scheduling of this procedure.  Dental procedures will then be scheduled as time and space permits in my schedule as well as the operating room schedule.  Alternatively, the patient may seek treatment with the oral surgeon of his choice as an inpatient or outpatient.  The dental extractions, the patient will follow up with primary dentist of his choice for fabrication of upper and lower complete dentures after adequate healing.   2. Discussion of findings with medical team and coordination of future medical and dental care as needed.    [name], DDS

## 2020-04-27 NOTE — Consult Note (Signed)
Reason for Consult: Preop clearance for dental extraction Referring Physician: [facility] hospitalist  Jim [place] is an 54 y.o. male.  HPI: Patient is 54 year old male with past medical history significant for multiple medical problems i.e. 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 Cardiology consultation is called for preop cardiac clearance. Patient presently denies any chest pain or shortness of breath. Denies any palpitation lightheadedness or syncope. Denies PND orthopnea leg swelling denies any further suicidal ideation. No further seizures episode. Sodium still remains low which is improving. Patient denies any fever or chills and is maintained on IV antibiotics.  Past Medical History:  Diagnosis Date  . CHF (congestive heart failure) (HCC)   . COPD (chronic obstructive pulmonary disease) (HCC)   . History of kidney stones   . Paranoid schizophrenia (HCC)   . Seizures (HCC)   . Tobacco abuse     Past Surgical History:  Procedure Laterality Date  . KIDNEY STONE SURGERY      Family History  Problem Relation Age of Onset  . Heart disease Other     Social History:  reports that he has been smoking cigarettes. He has been smoking about 1.00 pack per day. His smokeless tobacco use includes chew. He reports previous alcohol use. He reports that he does not use drugs.  Allergies: No Known Allergies  Medications: I have reviewed the patient's current medications.  Results for orders placed or performed during the hospital encounter of 02/23/20 (from the past 48 hour(s))   Comprehensive metabolic panel     Status: Abnormal   Collection Time: 04/26/20  2:17 AM  Result Value Ref Range   Sodium 128 (L) 135 - 145 mmol/L   Potassium 4.2 3.5 - 5.1 mmol/L   Chloride 94 (L) 98 - 111 mmol/L   CO2 26 22 - 32 mmol/L   Glucose, Bld 100 (H) 70 - 99 mg/dL    Comment: Glucose reference range applies only to samples taken after fasting for at least 8 hours.   BUN 10 6 - 20 mg/dL   Creatinine, Ser 0.92 (L) 0.61 - 1.24 mg/dL   Calcium 9.0 8.9 - 33.0 mg/dL   Total Protein 7.1 6.5 - 8.1 g/dL   Albumin 3.6 3.5 - 5.0 g/dL   AST 30 15 - 41 U/L   ALT 32 0 - 44 U/L   Alkaline Phosphatase 209 (H) 38 - 126 U/L   Total Bilirubin 0.3 0.3 - 1.2 mg/dL   GFR calc non Af Amer >60 >60 mL/min   GFR calc Af Amer >60 >60 mL/min   Anion gap 8 5 - 15    Comment: Performed at [facility], [address], [place], [place] [address]  CBC with Differential/Platelet     Status: Abnormal   Collection Time: 04/26/20  2:17 AM  Result Value Ref Range   WBC 8.0 4.0 - 10.5 K/uL   RBC 4.88 4.22 - 5.81 MIL/uL   Hemoglobin 10.6 (L) 13.0 - 17.0 g/dL   HCT 63.3 (L) 39 - 52 %   MCV 68.4 (L) 80.0 - 100.0 fL   MCH 21.7 (L) 26.0 - 34.0 pg   MCHC 31.7 30.0 - 36.0  g/dL   RDW 29.9 (H) 37.1 - 69.6 %   Platelets 217 150 - 400 K/uL   nRBC 0.0 0.0 - 0.2 %   Neutrophils Relative % 44 %   Neutro Abs 3.6 1.7 - 7.7 K/uL   Lymphocytes Relative 37 %   Lymphs Abs 2.9 0.7 - 4.0 K/uL   Monocytes Relative 14 %   Monocytes Absolute 1.1 (H) 0 - 1 K/uL   Eosinophils Relative 3 %   Eosinophils Absolute 0.3 0 - 0 K/uL   Basophils Relative 1 %   Basophils Absolute 0.0 0 - 0 K/uL   Immature Granulocytes 1 %   Abs Immature Granulocytes 0.05 0.00 - 0.07 K/uL   Polychromasia PRESENT    Target Cells PRESENT     Comment: Performed at [facility], [address], [place], [place] [address]  Magnesium     Status: None   Collection Time: 04/26/20  2:17 AM  Result Value Ref Range   Magnesium 2.1 1.7 - 2.4  mg/dL    Comment: Performed at [facility], [address], [place], [place] [address]  Phosphorus     Status: None   Collection Time: 04/26/20  2:17 AM  Result Value Ref Range   Phosphorus 4.6 2.5 - 4.6 mg/dL    Comment: Performed at [facility], [address], [place], [place] [address]  Comprehensive metabolic panel     Status: Abnormal   Collection Time: 04/27/20  7:54 AM  Result Value Ref Range   Sodium 129 (L) 135 - 145 mmol/L   Potassium 4.1 3.5 - 5.1 mmol/L   Chloride 95 (L) 98 - 111 mmol/L   CO2 25 22 - 32 mmol/L   Glucose, Bld 111 (H) 70 - 99 mg/dL    Comment: Glucose reference range applies only to samples taken after fasting for at least 8 hours.   BUN 6 6 - 20 mg/dL   Creatinine, Ser 5.27 (L) 0.61 - 1.24 mg/dL   Calcium 9.6 8.9 - 78.2 mg/dL   Total Protein 7.5 6.5 - 8.1 g/dL   Albumin 3.9 3.5 - 5.0 g/dL   AST 28 15 - 41 U/L   ALT 32 0 - 44 U/L   Alkaline Phosphatase 204 (H) 38 - 126 U/L   Total Bilirubin 0.6 0.3 - 1.2 mg/dL   GFR calc non Af Amer >60 >60 mL/min   GFR calc Af Amer >60 >60 mL/min   Anion gap 9 5 - 15    Comment: Performed at [facility], [address] [place], [place] [address]  Magnesium     Status: None   Collection Time: 04/27/20  7:54 AM  Result Value Ref Range   Magnesium 1.7 1.7 - 2.4 mg/dL    Comment: Performed at [facility], [address] [place], [place] [address]  Phosphorus     Status: Abnormal   Collection Time: 04/27/20  7:54 AM  Result Value Ref Range   Phosphorus 4.7 (H) 2.5 - 4.6 mg/dL    Comment: Performed at [facility], [address] [place], [place] [address]    No results found.  Review of Systems  Constitutional: Negative for chills and fever.  HENT: Positive for dental problem. Negative for facial swelling and sore throat.   Eyes: Negative for discharge.  Respiratory: Negative for chest tightness and shortness of breath.   Cardiovascular: Negative for chest pain, palpitations  and leg swelling.  Gastrointestinal: Negative for abdominal distention and abdominal pain.  Genitourinary: Negative for difficulty urinating.  Neurological: Negative for dizziness.   Blood pressure 128/83, pulse 74, temperature 97.8 F (36.6 C), temperature source Oral, resp. rate 18, height 5\' 8"  (1.727 m), weight 94.4 kg, SpO2 96 %. Physical Exam Constitutional:      Appearance: Normal appearance.  HENT:     Head: Normocephalic and atraumatic.  Eyes:     Extraocular Movements: Extraocular movements intact.     Pupils: Pupils are equal, round, and reactive to light.  Cardiovascular:     Rate and Rhythm: Normal rate and regular rhythm.     Heart sounds: Murmur (2/6 systolic murmur noted) heard.   Pulmonary:     Effort: Pulmonary effort is normal.     Breath sounds: Normal breath sounds. No wheezing or rales.  Abdominal:     General: Abdomen is flat. Bowel sounds are normal. There is no distension.     Palpations: Abdomen is soft.     Tenderness: There is no abdominal tenderness.  Musculoskeletal:        General: No swelling, tenderness or deformity.     Cervical back: Normal range of motion and neck supple. No rigidity.  Skin:    General: Skin is warm and dry.  Neurological:     General: No focal deficit present.     Mental Status: He is alert and oriented to person, place, and time.     Assessment/Plan: Nonischemic dilated cardiomyopathy Compensated systolic congestive heart failure Hyponatremia secondary to above Hypertensive heart disease with systolic dysfunction Hyperlipidemia COPD History of tobacco abuse Paranoid schizophrenia Seizure disorder History of EtOH abuse Tobacco abuse Plan Continue present management for now Consider reducing Lasix and increasing Entresto as blood pressure tolerates Patient presently fully compensated from cardiac point of view and acceptable risk for dental procedure from cardiac point of view. Patient will need repeat 2D echo  in few months once on maximally tolerated Entresto and carvedilol and if EF remains below 35% will refer him to EP for possible ICD.   04/27/2020, 10:12 AM

## 2020-04-27 NOTE — TOC Progression Note (Signed)
Transition of Care [facility]) - Progression Note    Patient Details  Name: [place] MRN: [id number] Date of Birth: [birth year]  Transition of Care [facility]) CM/SW Contact  Kermit Balo, RN Phone Number: 04/27/2020, 3:37 PM  Clinical Narrative:    CM has reached out to 11 different numbers for Group Homes in [place]/ [place] and [place] in [place]. Either no beds or had to leave a message. The [facility] numbers either didn't work or could leave a message.  TOC following.   Expected Discharge Plan: Assisted Living Barriers to Discharge: Continued Medical Work up, Unsafe home situation  Expected Discharge Plan and Services Expected Discharge Plan: Assisted Living       Living arrangements for the past 2 months: Single Family Home                                       Social Determinants of Health (SDOH) Interventions    Readmission Risk Interventions Readmission Risk Prevention Plan 01/23/2020  Transportation Screening Complete  HRI or Home Care Consult Complete  Social Work Consult for Recovery Care Planning/Counseling Complete  Palliative Care Screening Not Applicable  Medication Review Oceanographer) (No Data)

## 2020-04-28 LAB — COMPREHENSIVE METABOLIC PANEL
ALT: 28 U/L (ref 0–44)
AST: 26 U/L (ref 15–41)
Albumin: 3.4 g/dL — ABNORMAL LOW (ref 3.5–5.0)
Alkaline Phosphatase: 194 U/L — ABNORMAL HIGH (ref 38–126)
Anion gap: 6 (ref 5–15)
BUN: 10 mg/dL (ref 6–20)
CO2: 26 mmol/L (ref 22–32)
Calcium: 9 mg/dL (ref 8.9–10.3)
Chloride: 96 mmol/L — ABNORMAL LOW (ref 98–111)
Creatinine, Ser: 0.47 mg/dL — ABNORMAL LOW (ref 0.61–1.24)
GFR calc Af Amer: >60 mL/min (ref >60)
GFR calc non Af Amer: >60 mL/min (ref >60)
Glucose, Bld: 95 mg/dL (ref 70–99)
Potassium: 4.1 mmol/L (ref 3.5–5.1)
Sodium: 128 mmol/L — ABNORMAL LOW (ref 135–145)
Total Bilirubin: 0.5 mg/dL (ref 0.3–1.2)
Total Protein: 6.7 g/dL (ref 6.5–8.1)

## 2020-04-28 LAB — MAGNESIUM: Magnesium: 1.8 mg/dL (ref 1.7–2.4)

## 2020-04-28 LAB — CBC WITH DIFFERENTIAL/PLATELET
Abs Immature Granulocytes: 0.05 10*3/uL (ref 0.00–0.07)
Basophils Absolute: 0.1 10*3/uL (ref 0.0–0.1)
Basophils Relative: 1 %
Eosinophils Absolute: 0.3 10*3/uL (ref 0.0–0.5)
Eosinophils Relative: 4 %
HCT: 32.6 % — ABNORMAL LOW (ref 39.0–52.0)
Hemoglobin: 10.2 g/dL — ABNORMAL LOW (ref 13.0–17.0)
Immature Granulocytes: 1 %
Lymphocytes Relative: 41 %
Lymphs Abs: 3.2 10*3/uL (ref 0.7–4.0)
MCH: 21.4 pg — ABNORMAL LOW (ref 26.0–34.0)
MCHC: 31.3 g/dL (ref 30.0–36.0)
MCV: 68.3 fL — ABNORMAL LOW (ref 80.0–100.0)
Monocytes Absolute: 0.9 10*3/uL (ref 0.1–1.0)
Monocytes Relative: 12 %
Neutro Abs: 3.2 10*3/uL (ref 1.7–7.7)
Neutrophils Relative %: 41 %
Platelets: 209 10*3/uL (ref 150–400)
RBC: 4.77 MIL/uL (ref 4.22–5.81)
RDW: 22.8 % — ABNORMAL HIGH (ref 11.5–15.5)
WBC: 7.8 10*3/uL (ref 4.0–10.5)
nRBC: 0 % (ref 0.0–0.2)

## 2020-04-28 LAB — SARS CORONAVIRUS 2 BY RT PCR (HOSPITAL ORDER, PERFORMED IN ~~LOC~~ HOSPITAL LAB): SARS Coronavirus 2: NEGATIVE

## 2020-04-28 LAB — SURGICAL PCR SCREEN
MRSA, PCR: NEGATIVE
Staphylococcus aureus: NEGATIVE

## 2020-04-28 LAB — PHOSPHORUS: Phosphorus: 4.7 mg/dL — ABNORMAL HIGH (ref 2.5–4.6)

## 2020-04-28 LAB — PHENYTOIN LEVEL, TOTAL: Phenytoin Lvl: 12.9 ug/mL (ref 10.0–20.0)

## 2020-04-28 MED ORDER — CLONAZEPAM 0.5 MG PO TABS: 0.5000 mg | ORAL_TABLET | Freq: Three times a day (TID) | ORAL | Status: DC | PRN

## 2020-04-28 NOTE — Progress Notes (Signed)
Subjective:  Patient denies any chest pain or shortness of breath.  Denies palpitations, lightheadedness or syncope.  Dental pain, resolved.  Objective:  Vital Signs in the last 24 hours: Temp:  [97.7 F (36.5 C)-98.9 F (37.2 C)] 98.9 F (37.2 C) (08/25 1122) Pulse Rate:  [73-86] 86 (08/25 1122) Resp:  [16-18] 18 (08/25 1122) BP: (102-132)/(62-89) 102/71 (08/25 1122) SpO2:  [96 %-100 %] 98 % (08/25 1122) Weight:  [95.2 kg] 95.2 kg (08/25 0318)  Intake/Output from previous day: 08/24 0701 - 08/25 0700 In: 240 [P.O.:240] Out: -  Intake/Output from this shift: No intake/output data recorded.  Physical Exam: Neck: no adenopathy, no carotid bruit, no JVD and supple, symmetrical, trachea midline Lungs: clear to auscultation bilaterally Heart: regular rate and rhythm, S1, S2 normal and 2/6 systolic murmur noted Abdomen: soft, non-tender; bowel sounds normal; no masses,  no organomegaly Extremities: extremities normal, atraumatic, no cyanosis or edema  Lab Results: Recent Labs    04/27/20 0754 04/28/20 0154  WBC 6.7 7.8  HGB 11.7* 10.2*  PLT 228 209   Recent Labs    04/27/20 0754 04/28/20 0154  NA 129* 128*  K 4.1 4.1  CL 95* 96*  CO2 25 26  GLUCOSE 111* 95  BUN 6 10  CREATININE 0.56* 0.47*   No results for input(s): TROPONINI in the last 72 hours.  Invalid input(s): CK, MB Hepatic Function Panel Recent Labs    04/28/20 0154  PROT 6.7  ALBUMIN 3.4*  AST 26  ALT 28  ALKPHOS 194*  BILITOT 0.5   No results for input(s): CHOL in the last 72 hours. No results for input(s): PROTIME in the last 72 hours.  Imaging: Imaging results have been reviewed and No results found.  Cardiac Studies:  Assessment/Plan:  Nonischemic dilated cardiomyopathy Compensated systolic congestive heart failure Hyponatremia secondary to above Hypertensive heart disease with systolic dysfunction Hyperlipidemia COPD History of tobacco abuse Paranoid schizophrenia Seizure  disorder History of EtOH abuse Tobacco abuse Plan Continue present management. I will sign off.  Please call if needed. Follow-up with me in one to 2 weeks after discharge.  We'll uptitrate beta blockers, and  Entresto as tolerated  LOS: 65 days    Rinaldo Cloud 04/28/2020, 3:22 PM

## 2020-04-28 NOTE — TOC Progression Note (Signed)
Transition of Care [facility]) - Progression Note    Patient Details  Name: Jim [place] MRN: [id number] Date of Birth: 03-15-1966  Transition of Care [facility]) CM/SW Contact  Baldemar [place], [place] Phone Number: 04/28/2020, 2:47 PM  Clinical Narrative:   CSW following for discharge to group home. The following calls were made:  [facility]: Still has not reviewed referral, will follow back up [address] [facility]: Got office number, but Production designer, theatre/television/film not in. Unable to leave a message, told to call back [facility]: No male beds available right now [facility]: No answer, no voicemail available [facility]: Only takes females [employer]: Mental health clinic, no group home services [facility]: Answered, told to call back, then when called back, no answer and no voicemail setup [facility]: No male beds at this time    Expected Discharge Plan: Assisted Living Barriers to Discharge: Continued Medical Work up, Unsafe home situation  Expected Discharge Plan and Services Expected Discharge Plan: Assisted Living       Living arrangements for the past 2 months: Single Family Home                                       Social Determinants of Health (SDOH) Interventions    Readmission Risk Interventions Readmission Risk Prevention Plan 01/23/2020  Transportation Screening Complete  HRI or Home Care Consult Complete  Social Work Consult for Recovery Care Planning/Counseling Complete  Palliative Care Screening Not Applicable  Medication Review Oceanographer) (No Data)

## 2020-04-28 NOTE — Progress Notes (Signed)
[facility] PROGRESS NOTE  Jim Dunn PJK:[id number] DOB: 1965-11-15 DOA: 02/23/2020 PCP: System, Pcp Not In  Status: Inpatient---Remains inpatient appropriate because:Altered mental status and Unsafe d/c plan   Dispo: The patient is from: Home              Anticipated d/c is to: Group home              Anticipated d/c date is: > 3 days              Patient currently is not medically stable to d/c. **NEEDS SAFE DC PLAN** Currently awaiting placement, TOC team working on this.  Mother is currently working to obtain Medicaid/guardianship. Unsafe for discharge based on underlying schizophrenia and history of recurrent noncompliance which led to patient's current hospitalization  Discussed with LCSW/case management. Mother presented to hospital on 8/17 to work with patient regarding Medicaid application.  She also obtained a completed FL2 form which was requested by [facility].  Code Status: Full Family Communication: 8/24 updated patient's mother Jim Dunn by telephone HIPPA appropriate VM left [ [phone] or [phone]] DVT prophylaxis: 8/5 refused Lovenox therefore switched to SCDs since ambulating without difficulty Vaccination status: Vaccinated   HPI (time of admission): 54 year old male with past medical history of schizophrenia, seizure disorder, biventricular heart failure(Echo 01/2020 EF 25-30%with right-sided heart failure),severe tricuspid regurgitation, nicotine dependence and COPD who presents to [facility] emergency department with suicidal ideation and seizure.Patient explains that since he left the hospital several days ago(admitted6/17-6/19-was being managed for hyponatremia, thought to be multifactorial in origin. Patient left AMA on6/19 and that time had sodium of 132),he has been experiencing increasing fatigue. This increasing fatigue is associated with bilateral lower extremity tingling. Symptomsweresimilar to what prompted his presentation on  6/17. Additionally, he reported feeling depressed withsuicidal ideation, withouta plan. Patient reportedthat he hadattempted suicide in the distant past using pills but was unsuccessful and never sought medical attention. While the patient was waiting in the waiting room, he experienced a witnessed seizure while attempting to walk to the bathroom. Patient was immediately brought back to the emergency department for further evaluation. Patient was found to have substantial recurrent hyponatremia of 117 with concurrent hypokalemia of 2.6 and hypomagnesium of 1.4. Patient was administered 1236mg  of intravenous Fosphenytoinand initiated on low rate normal saline infusion. Patient was administered 2 g of intravenous magnesium and a 30 mEq of potassium.  Patient's electrolytes have improved, no further seizure episodes at this time.  Echo showed EF of 25-30%, cardiology recommended outpatient follow-up but otherwise continue Coreg, torsemide and Entresto.    Subjective: Denies significant dental pain.  States is very anxious about upcoming surgery and requesting medication for "my nerves".  Objective: Vitals:   04/28/20 0832 04/28/20 1122  BP:  102/71  Pulse:  86  Resp:  18  Temp:  98.9 F (37.2 C)  SpO2: 98% 98%   No intake or output data in the 24 hours ending 04/28/20 1331 Filed Weights   04/25/20 0500 04/27/20 0626 04/28/20 0318  Weight: 96 kg 94.4 kg 95.2 kg    Exam:  Constitutional: NAD, calm, comfortable Oral: Continues to have extremely poor dentition.  No obvious visible inflammation or drainage. Respiratory: clear to auscultation bilaterally on posterior exam, Normal respiratory effort at rest.  Room air Cardiovascular: Regular rate, no murmurs / rubs / gallops. No lower extremity edema.  No JVD.  Abdomen: no tenderness, Bowel sounds positive.    Neurologic: CN 2-12 grossly intact. Sensation intact,Strength 5/5  x all 4 extremities. Ambulates independently Psychiatric:  Alert and oriented x 3 although does not have appropriate insight or capacity to manage care independently   Assessment/Plan: Lip edema/dental caries with abscess (recurrent) Recurrent.  Initial episode in July that responded to IV antibiotics.  Dental caries and periapical abscess confirmed orthopantogram Had recurrence of her this weekend on 8/21 with again imaging confirming periapical periodontitis with abscesses involving the right upper molar and all of the nonincisor lower teeth Previously responded well to IV Unasyn so this was resumed on 8/21-WBC 8.0 Appreciate assistance of Dr. Kristin Bruins.  Plan for full dental extraction 8/26 Patient anxious regarding upcoming surgery-see below regarding as needed pharmacotherapy  Hyponatremia with seizures: Intravascular dehydration, resolved At time of admission diuretics torsemide & metolazone were held and subsequently  discontinued.   As of 8/9 Na+ down to 127 and further decreased to 126 initially went up to 28 but as of 8/12 after acute treatment for asymptomatic volume overload Weight had increased to 210.1 lbs and in positive balance I/O therefore associated decreased sodium suspected volume overload-below regarding treatment regimen 8/13 after diuresis sodium has decreased to 122 therefore Lasix placed on hold for 48 hours  8/23 Lasix has been resumed for multiple days and sodium remained stable at 128  Chronic congestive heart failure with reduced ejection fraction, 25%.-Associated right heart failure.   Recently treated for 2 days with twice daily Lasix dosing in context of asymptomatic heart failure exacerbation/weight gain. Na+ subsequently decreased to 126 and weight decreased from 210.1 lbs to 206.8 lbs-Lasix decreased to daily but baseline dosage increased from 40 mg to 60 mg -see above regarding sodium Echo May 2021-EF 25-30% Continue Coreg, Entresto, Daily weights with strict I/O-having excellent urinary output with current Lasix  dosage 60 mg daily-unable to uptitrate Entresto due to lower range BP readings Counseled extensively by multiple providers regarding need to adhere to salt restricted diet  Cardiac clearance requested by dental service-Dr. Sharyn Lull consulted and has cleared pt for surgery  Depression, Suicidal ideations: Resolved, seen by psychiatry.  Recommended to continue BuSpar, Neurontin, Invega.   Follow-up outpatient psychiatry.   Received his routine Invega shot 8/8, takes this monthly-next dose due 9/8 Increasing anxiety about impending surgery for 8/26 therefore Klonopin 0.5 mg TID prn ordered  Seizure disorder: Stable Secondary to hyponatremia and issues of nonadherence with medications prior to admission.   Dilantin level subtherapeutic on 7/18 at 2.6 - Dilantin dosage increased to 200 mg twice daily-repeat level on 7/25 also subtherapeutic to therefore dosage increased at recommendation of pharmacy to 200 in the morning and 300 at bedtime Repeat Dilantin level 7/30 has increased but still remains low at 6.4-discussed with pharmacist and have increased a.m. dose from 200 to 300 mg; repeat Dilantin level 8.7 on 8/4 -8/11 Dilantin level therapeutic at 11.7 -continue current dose  Microcytic anemia Iron studies relatively normal, hemoglobin stable  OSA Continue nocturnal CPAP-since mask changed compliance has improved  COPD: stable. As needed bronchodilators  Hyperlipidemia: Daily Lipitor 40 mg  Hypokalemia/hypomagnesemia Replace as needed  Prolonged QTC:  Appears to have resolved.   Can you Seroquel 200 mg daily.  Peripheral neuropathy Change gabapentin 300 mg QID to Lyrica 50 mg TID Repeat B12 level  Modest obesity Estimated body mass index is 31.91 kg/m as calculated from the following:   Height as of this encounter: 5\' 8"  (1.727 m).   Weight as of this encounter: 95.2 kg.      Data Reviewed: Basic Metabolic Panel: Recent  Labs  Lab 04/24/20 0856  04/25/20 0220 04/26/20 0217 04/27/20 0754 04/28/20 0154  NA 125* 127* 128* 129* 128*  K 4.1 4.5 4.2 4.1 4.1  CL 91* 93* 94* 95* 96*  CO2 25 25 26 25 26   GLUCOSE 109* 95 100* 111* 95  BUN 9 9 10 6 10   CREATININE 0.50* 0.60* 0.55* 0.56* 0.47*  CALCIUM 9.1 8.9 9.0 9.6 9.0  MG 1.8 1.7 2.1 1.7 1.8  PHOS 4.5 4.0 4.6 4.7* 4.7*   Liver Function Tests: Recent Labs  Lab 04/24/20 0856 04/25/20 0220 04/26/20 0217 04/27/20 0754 04/28/20 0154  AST 24 37 30 28 26   ALT 31 32 32 32 28  ALKPHOS 200* 200* 209* 204* 194*  BILITOT 0.5 0.6 0.3 0.6 0.5  PROT 7.0 6.8 7.1 7.5 6.7  ALBUMIN 3.6 3.4* 3.6 3.9 3.4*   No results for input(s): LIPASE, AMYLASE in the last 168 hours. No results for input(s): AMMONIA in the last 168 hours. CBC: Recent Labs  Lab 04/24/20 0856 04/25/20 0220 04/26/20 0217 04/27/20 0754 04/28/20 0154  WBC 10.7* 8.7 8.0 6.7 7.8  NEUTROABS 5.8 4.0 3.6 2.8 3.2  HGB 10.7* 10.3* 10.6* 11.7* 10.2*  HCT 33.7* 32.1* 33.4* 37.0* 32.6*  MCV 67.5* 68.3* 68.4* 69.8* 68.3*  PLT 209 205 217 228 209   Cardiac Enzymes: No results for input(s): CKTOTAL, CKMB, CKMBINDEX, TROPONINI in the last 168 hours. BNP (last 3 results) Recent Labs    01/22/20 0410 02/24/20 0030  BNP 697.9* 184.7*    ProBNP (last 3 results) No results for input(s): PROBNP in the last 8760 hours.  CBG: No results for input(s): GLUCAP in the last 168 hours.  No results found for this or any previous visit (from the past 240 hour(s)).   Studies: No results found.  Scheduled Meds: . atorvastatin  40 mg Oral Daily  . busPIRone  10 mg Oral TID  . carvedilol  3.125 mg Oral BID WC  . clonazePAM  0.25 mg Oral Daily  . digoxin  0.125 mg Oral Daily  . fluticasone furoate-vilanterol  1 puff Inhalation Daily  . furosemide  60 mg Oral Daily  . phenytoin  300 mg Oral BID  . pregabalin  75 mg Oral TID  . QUEtiapine  200 mg Oral Daily  . sacubitril-valsartan  1 tablet Oral BID   Continuous Infusions: .  ampicillin-sulbactam (UNASYN) IV 3 g (04/28/20 0832)  . [START ON 04/29/2020]  ceFAZolin (ANCEF) IV      Principal Problem:   Hyponatremia Active Problems:   Chronic systolic CHF (congestive heart failure) (HCC)   Mixed hyperlipidemia   COPD (chronic obstructive pulmonary disease) (HCC)   Seizure disorder (HCC)   Paranoid schizophrenia (HCC)   Hypokalemia   Hypomagnesemia   Suicidal ideation   Noncompliance with medications   Subtherapeutic serum dilantin level   Seizure (HCC)        Consultants:  Dentist/oral surgeon  Procedures:  None  Antibiotics: Anti-infectives (From admission, onward)   Start     Dose/Rate Route Frequency Ordered Stop   04/29/20 0800  ceFAZolin (ANCEF) IVPB 2g/100 mL premix        2 g 200 mL/hr over 30 Minutes Intravenous On call to O.R. 04/27/20 1814 04/30/20 0559   04/24/20 2100  Ampicillin-Sulbactam (UNASYN) 3 g in sodium chloride 0.9 % 100 mL IVPB        3 g 200 mL/hr over 30 Minutes Intravenous Every 6 hours 04/24/20 1936     04/02/20  2200  amoxicillin-clavulanate (AUGMENTIN) 875-125 MG per tablet 1 tablet        1 tablet Oral Every 12 hours 04/02/20 1405 04/04/20 2130   03/29/20 0930  Ampicillin-Sulbactam (UNASYN) 3 g in sodium chloride 0.9 % 100 mL IVPB  Status:  Discontinued        3 g 200 mL/hr over 30 Minutes Intravenous Every 6 hours 03/29/20 0915 04/02/20 1405       Time spent: 25 minutes    Junious Silk ANP  [facility] Pager [phone]. If 7PM-7AM, please contact night-coverage at [email or web address] 04/28/2020, 1:31 PM  LOS: 65 days

## 2020-04-29 ENCOUNTER — Inpatient Hospital Stay (HOSPITAL_COMMUNITY): Payer: Medicare PPO | Admitting: Anesthesiology

## 2020-04-29 ENCOUNTER — Encounter (HOSPITAL_COMMUNITY)
Admission: EM | Disposition: A | Discharge status: Home / Self Care | Payer: Self-pay | Source: Home / Self Care | Attending: Internal Medicine

## 2020-04-29 ENCOUNTER — Encounter (HOSPITAL_COMMUNITY): Payer: Self-pay | Admitting: Internal Medicine

## 2020-04-29 DIAGNOSIS — F200 Paranoid schizophrenia: Secondary | ICD-10-CM | POA: Diagnosis not present

## 2020-04-29 DIAGNOSIS — E222 Syndrome of inappropriate secretion of antidiuretic hormone: Secondary | ICD-10-CM | POA: Diagnosis not present

## 2020-04-29 DIAGNOSIS — R45851 Suicidal ideations: Secondary | ICD-10-CM | POA: Diagnosis not present

## 2020-04-29 DIAGNOSIS — K0401 Reversible pulpitis: Secondary | ICD-10-CM | POA: Diagnosis not present

## 2020-04-29 HISTORY — PX: MULTIPLE EXTRACTIONS WITH ALVEOLOPLASTY: SHX5342

## 2020-04-29 LAB — CBC WITH DIFFERENTIAL/PLATELET
Abs Immature Granulocytes: 0.06 10*3/uL (ref 0.00–0.07)
Basophils Absolute: 0.1 10*3/uL (ref 0.0–0.1)
Basophils Relative: 1 %
Eosinophils Absolute: 0.3 10*3/uL (ref 0.0–0.5)
Eosinophils Relative: 4 %
HCT: 34.8 % — ABNORMAL LOW (ref 39.0–52.0)
Hemoglobin: 10.9 g/dL — ABNORMAL LOW (ref 13.0–17.0)
Immature Granulocytes: 1 %
Lymphocytes Relative: 42 %
Lymphs Abs: 3.3 10*3/uL (ref 0.7–4.0)
MCH: 21.5 pg — ABNORMAL LOW (ref 26.0–34.0)
MCHC: 31.3 g/dL (ref 30.0–36.0)
MCV: 68.8 fL — ABNORMAL LOW (ref 80.0–100.0)
Monocytes Absolute: 0.8 10*3/uL (ref 0.1–1.0)
Monocytes Relative: 11 %
Neutro Abs: 3.2 10*3/uL (ref 1.7–7.7)
Neutrophils Relative %: 41 %
Platelets: 196 10*3/uL (ref 150–400)
RBC: 5.06 MIL/uL (ref 4.22–5.81)
RDW: 22.6 % — ABNORMAL HIGH (ref 11.5–15.5)
WBC: 7.7 10*3/uL (ref 4.0–10.5)
nRBC: 0 % (ref 0.0–0.2)

## 2020-04-29 LAB — COMPREHENSIVE METABOLIC PANEL
ALT: 31 U/L (ref 0–44)
AST: 32 U/L (ref 15–41)
Albumin: 3.6 g/dL (ref 3.5–5.0)
Alkaline Phosphatase: 193 U/L — ABNORMAL HIGH (ref 38–126)
Anion gap: 9 (ref 5–15)
BUN: 8 mg/dL (ref 6–20)
CO2: 26 mmol/L (ref 22–32)
Calcium: 9.2 mg/dL (ref 8.9–10.3)
Chloride: 94 mmol/L — ABNORMAL LOW (ref 98–111)
Creatinine, Ser: 0.51 mg/dL — ABNORMAL LOW (ref 0.61–1.24)
GFR calc Af Amer: >60 mL/min (ref >60)
GFR calc non Af Amer: >60 mL/min (ref >60)
Glucose, Bld: 91 mg/dL (ref 70–99)
Potassium: 4 mmol/L (ref 3.5–5.1)
Sodium: 129 mmol/L — ABNORMAL LOW (ref 135–145)
Total Bilirubin: 0.2 mg/dL — ABNORMAL LOW (ref 0.3–1.2)
Total Protein: 6.9 g/dL (ref 6.5–8.1)

## 2020-04-29 LAB — MAGNESIUM: Magnesium: 1.8 mg/dL (ref 1.7–2.4)

## 2020-04-29 LAB — PHOSPHORUS: Phosphorus: 4.8 mg/dL — ABNORMAL HIGH (ref 2.5–4.6)

## 2020-04-29 SURGERY — MULTIPLE EXTRACTION WITH ALVEOLOPLASTY
Anesthesia: General | Site: Mouth | Laterality: Bilateral

## 2020-04-29 MED ORDER — LIDOCAINE 2% (20 MG/ML) 5 ML SYRINGE
INTRAMUSCULAR | Status: AC
  Filled 2020-04-29: qty 5

## 2020-04-29 MED ORDER — KCL IN DEXTROSE-NACL 20-5-0.9 MEQ/L-%-% IV SOLN
INTRAVENOUS | Status: DC
  Filled 2020-04-29 (×2): qty 1000

## 2020-04-29 MED ORDER — MIDAZOLAM HCL 2 MG/2ML IJ SOLN
INTRAMUSCULAR | Status: AC
  Filled 2020-04-29: qty 2

## 2020-04-29 MED ORDER — 0.9 % SODIUM CHLORIDE (POUR BTL) OPTIME
TOPICAL | Status: DC | PRN
  Administered 2020-04-29: 1000 mL

## 2020-04-29 MED ORDER — ONDANSETRON HCL 4 MG/2ML IJ SOLN
INTRAMUSCULAR | Status: AC
  Filled 2020-04-29: qty 2

## 2020-04-29 MED ORDER — CHLORHEXIDINE GLUCONATE 0.12 % MT SOLN
15.0000 mL | OROMUCOSAL | Status: AC
  Administered 2020-04-29: 15 mL via OROMUCOSAL
  Filled 2020-04-29: qty 15

## 2020-04-29 MED ORDER — LIDOCAINE 2% (20 MG/ML) 5 ML SYRINGE
INTRAMUSCULAR | Status: DC | PRN
  Administered 2020-04-29: 60 mg via INTRAVENOUS

## 2020-04-29 MED ORDER — LACTATED RINGERS IV SOLN: INTRAVENOUS | Status: DC

## 2020-04-29 MED ORDER — MORPHINE SULFATE (CONCENTRATE) 10 MG/0.5ML PO SOLN
5.0000 mg | ORAL | Status: DC | PRN
  Administered 2020-04-29 – 2020-05-07 (×13): 5 mg via ORAL
  Filled 2020-04-29 (×14): qty 0.5

## 2020-04-29 MED ORDER — PROPOFOL 10 MG/ML IV BOLUS
INTRAVENOUS | Status: DC | PRN
  Administered 2020-04-29: 40 mg via INTRAVENOUS
  Administered 2020-04-29: 60 mg via INTRAVENOUS

## 2020-04-29 MED ORDER — ROCURONIUM BROMIDE 10 MG/ML (PF) SYRINGE
PREFILLED_SYRINGE | INTRAVENOUS | Status: AC
  Filled 2020-04-29: qty 10

## 2020-04-29 MED ORDER — HYDROMORPHONE HCL 1 MG/ML IJ SOLN
0.2500 mg | INTRAMUSCULAR | Status: DC | PRN
  Administered 2020-04-29: 0.25 mg via INTRAVENOUS

## 2020-04-29 MED ORDER — ROCURONIUM BROMIDE 10 MG/ML (PF) SYRINGE
PREFILLED_SYRINGE | INTRAVENOUS | Status: AC
  Filled 2020-04-29: qty 20

## 2020-04-29 MED ORDER — DEXAMETHASONE SODIUM PHOSPHATE 4 MG/ML IJ SOLN
INTRAMUSCULAR | Status: DC | PRN
  Administered 2020-04-29: 10 mg via INTRAVENOUS

## 2020-04-29 MED ORDER — FENTANYL CITRATE (PF) 250 MCG/5ML IJ SOLN
INTRAMUSCULAR | Status: AC
  Filled 2020-04-29: qty 5

## 2020-04-29 MED ORDER — PHENYLEPHRINE 40 MCG/ML (10ML) SYRINGE FOR IV PUSH (FOR BLOOD PRESSURE SUPPORT)
PREFILLED_SYRINGE | INTRAVENOUS | Status: DC | PRN
  Administered 2020-04-29 (×3): 80 ug via INTRAVENOUS
  Administered 2020-04-29: 120 ug via INTRAVENOUS

## 2020-04-29 MED ORDER — ROCURONIUM BROMIDE 10 MG/ML (PF) SYRINGE
PREFILLED_SYRINGE | INTRAVENOUS | Status: DC | PRN
  Administered 2020-04-29: 20 mg via INTRAVENOUS
  Administered 2020-04-29: 50 mg via INTRAVENOUS

## 2020-04-29 MED ORDER — GELATIN ABSORBABLE 12-7 MM EX MISC
CUTANEOUS | Status: DC | PRN
  Administered 2020-04-29: 1

## 2020-04-29 MED ORDER — HYDROMORPHONE HCL 1 MG/ML IJ SOLN
INTRAMUSCULAR | Status: AC
  Filled 2020-04-29: qty 1

## 2020-04-29 MED ORDER — MIDAZOLAM HCL 5 MG/5ML IJ SOLN
INTRAMUSCULAR | Status: DC | PRN
  Administered 2020-04-29: 2 mg via INTRAVENOUS

## 2020-04-29 MED ORDER — AMINOCAPROIC ACID SOLUTION 5% (50 MG/ML)
ORAL | Status: DC | PRN
  Administered 2020-04-29: 10 mL via ORAL

## 2020-04-29 MED ORDER — LIDOCAINE-EPINEPHRINE 2 %-1:100000 IJ SOLN
INTRAMUSCULAR | Status: AC
  Filled 2020-04-29: qty 10.2

## 2020-04-29 MED ORDER — SUGAMMADEX SODIUM 200 MG/2ML IV SOLN
INTRAVENOUS | Status: DC | PRN
  Administered 2020-04-29: 200 mg via INTRAVENOUS

## 2020-04-29 MED ORDER — MORPHINE SULFATE (PF) 2 MG/ML IV SOLN
1.0000 mg | INTRAVENOUS | Status: DC | PRN
  Administered 2020-04-29 (×3): 2 mg via INTRAVENOUS
  Filled 2020-04-29 (×4): qty 1

## 2020-04-29 MED ORDER — ONDANSETRON HCL 4 MG/2ML IJ SOLN
INTRAMUSCULAR | Status: DC | PRN
  Administered 2020-04-29: 4 mg via INTRAVENOUS

## 2020-04-29 MED ORDER — CHLORHEXIDINE GLUCONATE 0.12 % MT SOLN
OROMUCOSAL | Status: AC
  Administered 2020-04-30: 15 mL via OROMUCOSAL
  Filled 2020-04-29: qty 15

## 2020-04-29 MED ORDER — BUPIVACAINE-EPINEPHRINE 0.5% -1:200000 IJ SOLN
INTRAMUSCULAR | Status: DC | PRN
  Administered 2020-04-29: 3.4 mL

## 2020-04-29 MED ORDER — ACETAMINOPHEN 500 MG PO TABS
1000.0000 mg | ORAL_TABLET | Freq: Once | ORAL | Status: AC
  Administered 2020-04-29: 1000 mg via ORAL
  Filled 2020-04-29: qty 2

## 2020-04-29 MED ORDER — DEXAMETHASONE SODIUM PHOSPHATE 10 MG/ML IJ SOLN
INTRAMUSCULAR | Status: AC
  Filled 2020-04-29: qty 1

## 2020-04-29 MED ORDER — LIDOCAINE 2% (20 MG/ML) 5 ML SYRINGE
INTRAMUSCULAR | Status: AC
  Filled 2020-04-29: qty 10

## 2020-04-29 MED ORDER — PHENYLEPHRINE 40 MCG/ML (10ML) SYRINGE FOR IV PUSH (FOR BLOOD PRESSURE SUPPORT)
PREFILLED_SYRINGE | INTRAVENOUS | Status: AC
  Filled 2020-04-29: qty 10

## 2020-04-29 MED ORDER — FENTANYL CITRATE (PF) 100 MCG/2ML IJ SOLN
INTRAMUSCULAR | Status: DC | PRN
Start: 2020-04-29 — End: 2020-04-29
  Administered 2020-04-29: 25 ug via INTRAVENOUS
  Administered 2020-04-29 (×2): 50 ug via INTRAVENOUS
  Administered 2020-04-29: 100 ug via INTRAVENOUS
  Administered 2020-04-29: 25 ug via INTRAVENOUS

## 2020-04-29 MED ORDER — CHLORHEXIDINE GLUCONATE 0.12 % MT SOLN
15.0000 mL | Freq: Two times a day (BID) | OROMUCOSAL | Status: DC
  Administered 2020-04-29 – 2020-06-08 (×71): 15 mL via OROMUCOSAL
  Filled 2020-04-29 (×71): qty 15

## 2020-04-29 MED ORDER — OXYMETAZOLINE HCL 0.05 % NA SOLN
NASAL | Status: AC
  Filled 2020-04-29: qty 30

## 2020-04-29 MED ORDER — STERILE WATER FOR IRRIGATION IR SOLN
Status: DC | PRN
  Administered 2020-04-29: 1000 mL

## 2020-04-29 MED ORDER — OXYCODONE-ACETAMINOPHEN 5-325 MG PO TABS
1.0000 | ORAL_TABLET | Freq: Four times a day (QID) | ORAL | Status: DC | PRN
  Filled 2020-04-29: qty 2

## 2020-04-29 MED ORDER — BUPIVACAINE-EPINEPHRINE (PF) 0.5% -1:200000 IJ SOLN
INTRAMUSCULAR | Status: AC
  Filled 2020-04-29: qty 3.6

## 2020-04-29 MED ORDER — AMINOCAPROIC ACID SOLUTION 5% (50 MG/ML)
10.0000 mL | ORAL | Status: AC
  Administered 2020-04-29 (×7): 10 mL via ORAL
  Filled 2020-04-29: qty 100

## 2020-04-29 MED ORDER — LIDOCAINE-EPINEPHRINE 2 %-1:100000 IJ SOLN
INTRAMUSCULAR | Status: DC | PRN
  Administered 2020-04-29: 10.2 mL

## 2020-04-29 MED ORDER — THROMBIN 5000 UNITS EX SOLR
CUTANEOUS | Status: AC
  Filled 2020-04-29: qty 5000

## 2020-04-29 MED ORDER — ORAL CARE MOUTH RINSE
15.0000 mL | Freq: Two times a day (BID) | OROMUCOSAL | Status: DC
  Administered 2020-04-30 – 2020-05-28 (×38): 15 mL via OROMUCOSAL

## 2020-04-29 SURGICAL SUPPLY — 41 items
ALCOHOL 70% 16 OZ (MISCELLANEOUS) ×3 IMPLANT
ATTRACTOMAT 16X20 MAGNETIC DRP (DRAPES) ×3 IMPLANT
BANDAGE HEMOSTAT MRDH 4X4 STRL (MISCELLANEOUS) IMPLANT
BLADE SURG 15 STRL LF DISP TIS (BLADE) ×4 IMPLANT
BLADE SURG 15 STRL SS (BLADE) ×8
BNDG HEMOSTAT MRDH 4X4 STRL (MISCELLANEOUS)
COVER SURGICAL LIGHT HANDLE (MISCELLANEOUS) ×3 IMPLANT
COVER WAND RF STERILE (DRAPES) IMPLANT
GAUZE 4X4 16PLY RFD (DISPOSABLE) ×3 IMPLANT
GAUZE PACKING FOLDED 2  STR (GAUZE/BANDAGES/DRESSINGS) ×2
GAUZE PACKING FOLDED 2 STR (GAUZE/BANDAGES/DRESSINGS) ×1 IMPLANT
GAUZE SPONGE 4X4 16PLY XRAY LF (GAUZE/BANDAGES/DRESSINGS) ×3 IMPLANT
GLOVE BIO SURGEON STRL SZ 6.5 (GLOVE) ×4 IMPLANT
GLOVE BIO SURGEONS STRL SZ 6.5 (GLOVE) ×2
GLOVE SURG ORTHO 8.0 STRL STRW (GLOVE) ×3 IMPLANT
GOWN STRL REUS W/ TWL LRG LVL3 (GOWN DISPOSABLE) ×1 IMPLANT
GOWN STRL REUS W/TWL 2XL LVL3 (GOWN DISPOSABLE) ×3 IMPLANT
GOWN STRL REUS W/TWL LRG LVL3 (GOWN DISPOSABLE) ×2
HEMOSTAT SURGICEL 2X14 (HEMOSTASIS) IMPLANT
KIT BASIN OR (CUSTOM PROCEDURE TRAY) ×3 IMPLANT
KIT TURNOVER KIT B (KITS) ×3 IMPLANT
MANIFOLD NEPTUNE II (INSTRUMENTS) ×3 IMPLANT
NEEDLE BLUNT 16X1.5 OR ONLY (NEEDLE) ×3 IMPLANT
NEEDLE DENTAL 27 LONG (NEEDLE) ×3 IMPLANT
NS IRRIG 1000ML POUR BTL (IV SOLUTION) ×3 IMPLANT
PACK EENT II TURBAN DRAPE (CUSTOM PROCEDURE TRAY) ×3 IMPLANT
PAD ARMBOARD 7.5X6 YLW CONV (MISCELLANEOUS) ×3 IMPLANT
SPONGE SURGIFOAM ABS GEL 100 (HEMOSTASIS) ×3 IMPLANT
SPONGE SURGIFOAM ABS GEL 12-7 (HEMOSTASIS) IMPLANT
SPONGE SURGIFOAM ABS GEL SZ50 (HEMOSTASIS) IMPLANT
SUCTION FRAZIER HANDLE 10FR (MISCELLANEOUS) ×2
SUCTION TUBE FRAZIER 10FR DISP (MISCELLANEOUS) ×1 IMPLANT
SUT CHROMIC 3 0 PS 2 (SUTURE) ×6 IMPLANT
SUT CHROMIC 4 0 P 3 18 (SUTURE) ×9 IMPLANT
SYR 50ML SLIP (SYRINGE) ×3 IMPLANT
TOWEL GREEN STERILE (TOWEL DISPOSABLE) ×3 IMPLANT
TUBE CONNECTING 12'X1/4 (SUCTIONS) ×1
TUBE CONNECTING 12X1/4 (SUCTIONS) ×2 IMPLANT
WATER STERILE IRR 1000ML POUR (IV SOLUTION) ×3 IMPLANT
WATER TABLETS ICX (MISCELLANEOUS) ×3 IMPLANT
YANKAUER SUCT BULB TIP NO VENT (SUCTIONS) ×3 IMPLANT

## 2020-04-29 NOTE — Anesthesia Procedure Notes (Addendum)
Procedure Name: Intubation Date/Time: 04/29/2020 9:07 AM Performed by: Caren Macadam, CRNA Pre-anesthesia Checklist: Patient identified, Emergency Drugs available, Suction available and Patient being monitored Patient Re-evaluated:Patient Re-evaluated prior to induction Oxygen Delivery Method: Circle system utilized Preoxygenation: Pre-oxygenation with 100% oxygen Induction Type: IV induction Ventilation: Mask ventilation without difficulty Laryngoscope Size: Glidescope (elective glide) Grade View: Grade I Nasal Tubes: Nasal prep performed and Nasal Rae Number of attempts: 1 Placement Confirmation: ETT inserted through vocal cords under direct vision,  positive ETCO2 and breath sounds checked- equal and bilateral Secured at: 24 cm Tube secured with: Tape Dental Injury: Teeth and Oropharynx as per pre-operative assessment

## 2020-04-29 NOTE — Anesthesia Postprocedure Evaluation (Signed)
Anesthesia Post Note  Patient: Jim Dunn  Procedure(s) Performed: Extraction of tooth #'s 2-13, 17,18, 21-25, and 27-31 with alveoloplasty and bilateral lingual exostoses reductions. (Bilateral Mouth)     Patient location during evaluation: PACU Anesthesia Type: General Level of consciousness: awake and alert Pain management: pain level controlled Vital Signs Assessment: post-procedure vital signs reviewed and stable Respiratory status: spontaneous breathing, nonlabored ventilation and respiratory function stable Cardiovascular status: blood pressure returned to baseline and stable Postop Assessment: no apparent nausea or vomiting Anesthetic complications: no   No complications documented.  Last Vitals:  Vitals:   04/29/20 1150 04/29/20 1157  BP: (!) 136/91 (!) 141/86  Pulse: 92 86  Resp: 11 14  Temp:  36.6 C  SpO2: 96% 95%    Last Pain:  Vitals:   04/29/20 1157  TempSrc:   PainSc: 7                  Braxtyn Dorff,W. EDMOND

## 2020-04-29 NOTE — Progress Notes (Signed)
Pt. Refused cpap for tonight. Pt. States he had sx in his mouth today.

## 2020-04-29 NOTE — Progress Notes (Signed)
[facility] PROGRESS NOTE  Jim Dunn ATF:[id number] DOB: [birth year] DOA: 02/23/2020 PCP: System, Pcp Not In  Status: Inpatient---Remains inpatient appropriate because:Altered mental status and Unsafe d/c plan   Dispo: The patient is from: Home              Anticipated d/c is to: Group home              Anticipated d/c date is: > 3 days              Patient currently is not medically stable to d/c. **NEEDS SAFE DC PLAN** Currently awaiting placement, TOC team working on this.  Mother is currently working to obtain Medicaid/guardianship. Unsafe for discharge based on underlying schizophrenia and history of recurrent noncompliance which led to patient's current hospitalization  Discussed with LCSW/case management. Mother presented to hospital on 8/17 to work with patient regarding Medicaid application.  She also obtained a completed FL2 form which was requested by [facility].  Code Status: Full Family Communication: 8/26 updated [name] bedside [ [phone] or [phone]] DVT prophylaxis: 8/5 refused Lovenox therefore switched to SCDs since ambulating without difficulty Vaccination status: Vaccinated   HPI (time of admission): 54 year old male with past medical history of schizophrenia, seizure disorder, biventricular heart failure(Echo 01/2020 EF 25-30%with right-sided heart failure),severe tricuspid regurgitation, nicotine dependence and COPD who presents to [facility] emergency department with suicidal ideation and seizure.Patient explains that since he left the hospital several days ago(admitted6/17-6/19-was being managed for hyponatremia, thought to be multifactorial in origin. Patient left AMA on6/19 and that time had sodium of 132),he has been experiencing increasing fatigue. This increasing fatigue is associated with bilateral lower extremity tingling. Symptomsweresimilar to what prompted his presentation on 6/17. Additionally, he  reported feeling depressed withsuicidal ideation, withouta plan. Patient reportedthat he hadattempted suicide in the distant past using pills but was unsuccessful and never sought medical attention. While the patient was waiting in the waiting room, he experienced a witnessed seizure while attempting to walk to the bathroom. Patient was immediately brought back to the emergency department for further evaluation. Patient was found to have substantial recurrent hyponatremia of 117 with concurrent hypokalemia of 2.6 and hypomagnesium of 1.4. Patient was administered 1236mg  of intravenous Fosphenytoinand initiated on low rate normal saline infusion. Patient was administered 2 g of intravenous magnesium and a 30 mEq of potassium.  Patient's electrolytes have improved, no further seizure episodes at this time.  Echo showed EF of 25-30%, cardiology recommended outpatient follow-up but otherwise continue Coreg, torsemide and Entresto.    Subjective: Evaluated postoperatively-denies significant pain Appears to be in good spirits-mother at bedside.  Objective: Vitals:   04/29/20 1150 04/29/20 1157  BP: (!) 136/91 (!) 141/86  Pulse: 92 86  Resp: 11 14  Temp:  97.8 F (36.6 C)  SpO2: 96% 95%    Intake/Output Summary (Last 24 hours) at 04/29/2020 1246 Last data filed at 04/29/2020 1100 Gross per 24 hour  Intake 400 ml  Output 150 ml  Net 250 ml   Filed Weights   04/28/20 0318 04/29/20 0420 04/29/20 0821  Weight: 95.2 kg 95.1 kg 95.1 kg    Exam:  Constitutional: NAD, calm, comfortable Oral: Oral packing in place status post full dental extraction.  Minimal bleeding on packing. Respiratory: clear to auscultation bilaterally on posterior exam, Normal respiratory effort at rest.  Room air Cardiovascular: Regular rate, no murmurs / rubs / gallops. No lower extremity edema.  No JVD.  Abdomen: no tenderness, Bowel  sounds positive.    Neurologic: CN 2-12 grossly intact. Sensation  intact,Strength 5/5 x all 4 extremities. Ambulates independently Psychiatric: Alert and oriented x 3 although does not have appropriate insight or capacity to manage care independently   Assessment/Plan: Recurrent dental abscesses status post full dental extraction on 8/26 Recurrent.  Initial episode in July that responded to IV antibiotics.  Dental caries and periapical abscess confirmed orthopantogram Had recurrence of her this weekend on 8/21 with again imaging confirming periapical periodontitis with abscesses involving the right upper molar and all of the nonincisor lower teeth Previously responded well to IV Unasyn so this was resumed on 8/21-WBC 8.0 Status post full dental extraction on 8/26.  Anticipate patient may have variable oral intake so have initiated IV fluids at 50 cc/h, stopped usual Lasix, transitioned pain medications to morphine elixir with IV morphine for breakthrough pain  Hyponatremia with seizures: Intravascular dehydration, resolved At time of admission diuretics torsemide & metolazone were held and subsequently  discontinued.   As of 8/9 Na+ down to 127 and further decreased to 126 initially went up to 28 but as of 8/12 after acute treatment for asymptomatic volume overload Weight had increased to 210.1 lbs and in positive balance I/O therefore associated decreased sodium suspected volume overload-below regarding treatment regimen 8/13 after diuresis sodium has decreased to 122 therefore Lasix placed on hold for 48 hours  8/23 Lasix has been resumed for multiple days and sodium remained stable at 128 but as of 8/26 Lasix discontinued temporarily until able to tolerate oral diet better-see above  Chronic congestive heart failure with reduced ejection fraction, 25%.-Associated right heart failure.   Recently treated for 2 days with twice daily Lasix dosing in context of asymptomatic heart failure exacerbation/weight gain. Na+ subsequently decreased to 126 and weight  decreased from 210.1 lbs to 206.8 lbs-Lasix decreased to daily but baseline dosage increased from 40 mg to 60 mg -see above regarding sodium Echo May 2021-EF 25-30% Continue Coreg, Entresto, Daily weights with strict I/O-having excellent urinary output with current Lasix dosage 60 mg daily-unable to uptitrate Entresto due to lower range BP readings Counseled extensively by multiple providers regarding need to adhere to salt restricted diet  For surgery by Dr. Paulette Blanch will need cardiac follow-up after discharge and likely will need follow-up echo-if EF remains 30% or less would need to be referred to EP clinic for consideration for ICD implantation  Depression, Suicidal ideations: Resolved, seen by psychiatry.  Recommended to continue BuSpar, Neurontin, Invega.   Follow-up outpatient psychiatry.   Received his routine Invega shot 8/8, takes this monthly-next dose due 9/8 Increasing anxiety about impending surgery for 8/26 therefore Klonopin 0.5 mg TID prn ordered  Seizure disorder: Stable Secondary to hyponatremia and issues of nonadherence with medications prior to admission.   Dilantin level subtherapeutic on 7/18 at 2.6 - Dilantin dosage increased to 200 mg twice daily-repeat level on 7/25 also subtherapeutic to therefore dosage increased at recommendation of pharmacy to 200 in the morning and 300 at bedtime Repeat Dilantin level 7/30 has increased but still remains low at 6.4-discussed with pharmacist and have increased a.m. dose from 200 to 300 mg; repeat Dilantin level 8.7 on 8/4 -8/11 Dilantin level therapeutic at 11.7 -continue current dose  Microcytic anemia Iron studies relatively normal, hemoglobin stable If has significant bleeding postoperatively may need to repeat CBC for several days.  OSA Continue nocturnal CPAP-since mask changed compliance has improved  COPD: stable. As needed bronchodilators  Hyperlipidemia: Daily Lipitor 40  mg  Hypokalemia/hypomagnesemia Replace as needed  Prolonged QTC:  Appears to have resolved.   Can you Seroquel 200 mg daily.  Peripheral neuropathy Change gabapentin 300 mg QID to Lyrica 50 mg TID Repeat B12 level  Modest obesity Estimated body mass index is 31.88 kg/m as calculated from the following:   Height as of this encounter: 5\' 8"  (1.727 m).   Weight as of this encounter: 95.1 kg.      Data Reviewed: Basic Metabolic Panel: Recent Labs  Lab 04/25/20 0220 04/26/20 0217 04/27/20 0754 04/28/20 0154 04/29/20 0443  NA 127* 128* 129* 128* 129*  K 4.5 4.2 4.1 4.1 4.0  CL 93* 94* 95* 96* 94*  CO2 25 26 25 26 26   GLUCOSE 95 100* 111* 95 91  BUN 9 10 6 10 8   CREATININE 0.60* 0.55* 0.56* 0.47* 0.51*  CALCIUM 8.9 9.0 9.6 9.0 9.2  MG 1.7 2.1 1.7 1.8 1.8  PHOS 4.0 4.6 4.7* 4.7* 4.8*   Liver Function Tests: Recent Labs  Lab 04/25/20 0220 04/26/20 0217 04/27/20 0754 04/28/20 0154 04/29/20 0443  AST 37 30 28 26  32  ALT 32 32 32 28 31  ALKPHOS 200* 209* 204* 194* 193*  BILITOT 0.6 0.3 0.6 0.5 0.2*  PROT 6.8 7.1 7.5 6.7 6.9  ALBUMIN 3.4* 3.6 3.9 3.4* 3.6   No results for input(s): LIPASE, AMYLASE in the last 168 hours. No results for input(s): AMMONIA in the last 168 hours. CBC: Recent Labs  Lab 04/25/20 0220 04/26/20 0217 04/27/20 0754 04/28/20 0154 04/29/20 0443  WBC 8.7 8.0 6.7 7.8 7.7  NEUTROABS 4.0 3.6 2.8 3.2 3.2  HGB 10.3* 10.6* 11.7* 10.2* 10.9*  HCT 32.1* 33.4* 37.0* 32.6* 34.8*  MCV 68.3* 68.4* 69.8* 68.3* 68.8*  PLT 205 217 228 209 196   Cardiac Enzymes: No results for input(s): CKTOTAL, CKMB, CKMBINDEX, TROPONINI in the last 168 hours. BNP (last 3 results) Recent Labs    01/22/20 0410 02/24/20 0030  BNP 697.9* 184.7*    ProBNP (last 3 results) No results for input(s): PROBNP in the last 8760 hours.  CBG: No results for input(s): GLUCAP in the last 168 hours.  Recent Results (from the past 240 hour(s))  SARS Coronavirus 2  by RT PCR (hospital order, performed in [facility] hospital lab) Nasopharyngeal Nasopharyngeal Swab     Status: None   Collection Time: 04/28/20 12:38 PM   Specimen: Nasopharyngeal Swab  Result Value Ref Range Status   SARS Coronavirus 2 NEGATIVE NEGATIVE Final    Comment: (NOTE) SARS-CoV-2 target nucleic acids are NOT DETECTED.  The SARS-CoV-2 RNA is generally detectable in upper and lower respiratory specimens during the acute phase of infection. The lowest concentration of SARS-CoV-2 viral copies this assay can detect is 250 copies / mL. A negative result does not preclude SARS-CoV-2 infection and should not be used as the sole basis for treatment or other patient management decisions.  A negative result may occur with improper specimen collection / handling, submission of specimen other than nasopharyngeal swab, presence of viral mutation(s) within the areas targeted by this assay, and inadequate number of viral copies (<250 copies / mL). A negative result must be combined with clinical observations, patient history, and epidemiological information.  Fact Sheet for Patients:   BoilerBrush.com.cy  Fact Sheet for Healthcare Providers: https://pope.com/  This test is not yet approved or  cleared by the [place] FDA and has been authorized for detection and/or diagnosis of SARS-CoV-2 by FDA under an Emergency Use Authorization (  EUA).  This EUA will remain in effect (meaning this test can be used) for the duration of the COVID-19 declaration under Section 564(b)(1) of the Act, 21 U.S.C. section 360bbb-3(b)(1), unless the authorization is terminated or revoked sooner.  Performed at [facility], [address], [place], [place] [address]   Surgical pcr screen     Status: None   Collection Time: 04/28/20  3:37 PM   Specimen: Nasal Mucosa; Nasal Swab  Result Value Ref Range Status   MRSA, PCR NEGATIVE NEGATIVE Final    Staphylococcus aureus NEGATIVE NEGATIVE Final    Comment: (NOTE) The Xpert SA Assay (FDA approved for NASAL specimens in patients 58 years of age and older), is one component of a comprehensive surveillance program. It is not intended to diagnose infection nor to guide or monitor treatment. Performed at [facility], [address] [place], [place] [address]      Studies: No results found.  Scheduled Meds: . aminocaproic acid  10 mL Oral Q1H  . atorvastatin  40 mg Oral Daily  . busPIRone  10 mg Oral TID  . carvedilol  3.125 mg Oral BID WC  . chlorhexidine      . clonazePAM  0.25 mg Oral Daily  . digoxin  0.125 mg Oral Daily  . fluticasone furoate-vilanterol  1 puff Inhalation Daily  . HYDROmorphone      . phenytoin  300 mg Oral BID  . pregabalin  75 mg Oral TID  . QUEtiapine  200 mg Oral Daily  . sacubitril-valsartan  1 tablet Oral BID   Continuous Infusions: . ampicillin-sulbactam (UNASYN) IV 3 g (04/29/20 0319)  . dextrose 5 % and 0.9 % NaCl with KCl 20 mEq/L    . lactated ringers 10 mL/hr at 04/29/20 0858  . lactated ringers      Principal Problem:   Hyponatremia Active Problems:   Chronic systolic CHF (congestive heart failure) (HCC)   Mixed hyperlipidemia   COPD (chronic obstructive pulmonary disease) (HCC)   Seizure disorder (HCC)   Paranoid schizophrenia (HCC)   Hypokalemia   Hypomagnesemia   Suicidal ideation   Noncompliance with medications   Subtherapeutic serum dilantin level   Seizure (HCC)        Consultants:  Dentist/oral surgeon  Procedures:  None  Antibiotics: Anti-infectives (From admission, onward)   Start     Dose/Rate Route Frequency Ordered Stop   04/29/20 0800  ceFAZolin (ANCEF) IVPB 2g/100 mL premix        2 g 200 mL/hr over 30 Minutes Intravenous On call to O.R. 04/27/20 1814 04/29/20 0918   04/24/20 2100  Ampicillin-Sulbactam (UNASYN) 3 g in sodium chloride 0.9 % 100 mL IVPB        3 g 200 mL/hr over 30 Minutes  Intravenous Every 6 hours 04/24/20 1936     04/02/20 2200  amoxicillin-clavulanate (AUGMENTIN) 875-125 MG per tablet 1 tablet        1 tablet Oral Every 12 hours 04/02/20 1405 04/04/20 2130   03/29/20 0930  Ampicillin-Sulbactam (UNASYN) 3 g in sodium chloride 0.9 % 100 mL IVPB  Status:  Discontinued        3 g 200 mL/hr over 30 Minutes Intravenous Every 6 hours 03/29/20 0915 04/02/20 1405       Time spent: 25 minutes    Junious Silk ANP  [facility] Pager [phone]. If 7PM-7AM, please contact night-coverage at [email or web address] 04/29/2020, 12:46 PM  LOS: 66 days

## 2020-04-29 NOTE — Progress Notes (Signed)
PRE-OPERATIVE NOTE:  04/29/2020 [name] [id number]  VITALS: BP (!) 144/91 (BP Location: Left Arm)   Pulse 68   Temp 97.7 F (36.5 C) (Oral)   Resp 18   Ht 5\' 8"  (1.727 m)   Wt 95.1 kg   SpO2 98%   BMI 31.88 kg/m   Lab Results  Component Value Date   WBC 7.7 04/29/2020   HGB 10.9 (L) 04/29/2020   HCT 34.8 (L) 04/29/2020   MCV 68.8 (L) 04/29/2020   PLT 196 04/29/2020   BMET    Component Value Date/Time   NA 129 (L) 04/29/2020 0443   K 4.0 04/29/2020 0443   CL 94 (L) 04/29/2020 0443   CO2 26 04/29/2020 0443   GLUCOSE 91 04/29/2020 0443   BUN 8 04/29/2020 0443   CREATININE 0.51 (L) 04/29/2020 0443   CALCIUM 9.2 04/29/2020 0443   GFRNONAA >60 04/29/2020 0443   GFRAA >60 04/29/2020 0443    Lab Results  Component Value Date   INR 1.1 02/19/2020   No results found for: PTT   02/21/2020 presents for multiple dental extractions with alveoloplasty and preprosthetic surgery as needed in the operating room with general anesthesia.    SUBJECTIVE: The patient denies any acute medical or dental changes and agrees to proceed with treatment as planned.  EXAM: No sign of acute dental changes.  ASSESSMENT: Patient is affected by chronic apical periodontitis, retained root segments, dental caries, chronic periodontitis, and loose teeth.  PLAN: Patient agrees to proceed with treatment as planned in the operating room as previously discussed and accepts the risks, benefits, and complications of the proposed treatment. Patient is aware of the risk for bleeding, bruising, swelling, infection, pain, nerve damage, soft tissue damage, sinus involvement, root tip fracture, mandible fracture, and the risks of complications associated with the anesthesia. Patient also is aware of the potential for other complications not mentioned above.   [name], DDS

## 2020-04-29 NOTE — Discharge Instructions (Signed)

## 2020-04-29 NOTE — Progress Notes (Signed)
Pt returned from PACU at this time.  He is complaining of some pain but was medicated in PACU.  He has gauze in mouth and draining bloody drainage.  He is alert and oriented.  VSS.  Bed alarm set and call bell within reach.

## 2020-04-29 NOTE — Anesthesia Preprocedure Evaluation (Addendum)
Anesthesia Evaluation  Patient identified by MRN, date of birth, ID band Patient awake    Reviewed: Allergy & Precautions, H&P , NPO status , Patient's Chart, lab work & pertinent test results  Airway Mallampati: II  TM Distance: >3 FB Neck ROM: Full    Dental no notable dental hx. (+) Poor Dentition, Dental Advisory Given   Pulmonary COPD,  COPD inhaler, Current Smoker,    Pulmonary exam normal breath sounds clear to auscultation       Cardiovascular +CHF  + Valvular Problems/Murmurs MR  Rhythm:Regular Rate:Normal     Neuro/Psych Seizures -, Well Controlled,  Schizophrenia    GI/Hepatic negative GI ROS, Neg liver ROS,   Endo/Other  negative endocrine ROS  Renal/GU negative Renal ROS  negative genitourinary   Musculoskeletal   Abdominal   Peds  Hematology  (+) Blood dyscrasia, anemia ,   Anesthesia Other Findings   Reproductive/Obstetrics negative OB ROS                           Anesthesia Physical Anesthesia Plan  ASA: III  Anesthesia Plan: General   Post-op Pain Management:    Induction: Intravenous  PONV Risk Score and Plan: 2 and Ondansetron, Dexamethasone and Midazolam  Airway Management Planned: Nasal ETT and Video Laryngoscope Planned  Additional Equipment:   Intra-op Plan:   Post-operative Plan: Extubation in OR  Informed Consent: I have reviewed the patients History and Physical, chart, labs and discussed the procedure including the risks, benefits and alternatives for the proposed anesthesia with the patient or authorized representative who has indicated his/her understanding and acceptance.     Dental advisory given  Plan Discussed with: CRNA  Anesthesia Plan Comments:         Anesthesia Quick Evaluation

## 2020-04-29 NOTE — Transfer of Care (Signed)
Immediate Anesthesia Transfer of Care Note  Patient: Jim Dunn  Procedure(s) Performed: Extraction of tooth #'s 2-13, 17,18, 21-25, and 27-31 with alveoloplasty and bilateral lingual exostoses reductions. (Bilateral Mouth)  Patient Location: PACU  Anesthesia Type:General  Level of Consciousness: awake  Airway & Oxygen Therapy: Patient Spontanous Breathing and Patient connected to face mask oxygen  Post-op Assessment: Report given to RN and Post -op Vital signs reviewed and stable  Post vital signs: Reviewed and stable  Last Vitals:  Vitals Value Taken Time  BP    Temp    Pulse    Resp    SpO2      Last Pain:  Vitals:   04/29/20 0800  TempSrc:   PainSc: 0-No pain      Patients Stated Pain Goal: 0 (04/24/20 1944)  Complications: No complications documented.

## 2020-04-29 NOTE — Op Note (Signed)
OPERATIVE REPORT  Patient:            Jim Dunn Date of Birth:  08-Jul-1966 MRN:                [id number]   DATE OF PROCEDURE:  04/29/2020  PREOPERATIVE DIAGNOSES: 1.  Chronic systolic congestive heart failure 2.  Schizophrenia 3.  Acute pulpitis 4.  Chronic apical periodontitis 5.  Retained root segments 6.  Dental caries 7.  Chronic periodontitis 8.  Loose teeth 9.  Bilateral mandibular lingual exostoses  POSTOPERATIVE DIAGNOSES: 1.  Chronic systolic congestive heart failure 2.  Schizophrenia 3.  Acute pulpitis 4.  Chronic apical periodontitis 5.  Retained root segments 6.  Dental caries 7.  Chronic periodontitis 8.  Loose teeth 9.  Bilateral mandibular lingual exostoses  OPERATIONS: 1. Multiple extraction of tooth numbers 2 -13, 17, 18, 21 -25, and 27 - 31. 2. 4 Quadrants of alveoloplasty 3.  Bilateral mandibular lingual exostoses reductions  SURGEON: [name], DDS  ASSISTANT: Pearletha Ashon (dental assistant)  ANESTHESIA: General anesthesia via nasoendotracheal tube.  MEDICATIONS: 1. Ancef 2 g IV prior to invasive dental procedures. 2. Local anesthesia with a total utilization of 5 carpules each containing 34 mg of lidocaine with 0.017 mg of epinephrine as well as 2 carpules each containing 9 mg of bupivacaine with 0.009 mg of epinephrine.  SPECIMENS: There are 24 teeth that were discarded.  DRAINS: None  CULTURES: None  COMPLICATIONS: None  ESTIMATED BLOOD LOSS: 150 mLs.  INTRAVENOUS FLUIDS: 600 mLs of Lactated ringers solution.  INDICATIONS: The patient was recently admitted with chronic systolic congestive heart failure and hyponatremia.  Patient subsequently developed acute pulpitis symptoms. A medically necessary dental consultation was then requested to evaluate poor dentition.  The patient was examined and treatment planned for traction remaining teeth with alveoloplasty and preprosthetic surgery as indicated..  This treatment plan  was formulated to decrease the risks and complications associated with dental infection from affecting the patient's systemic health.  OPERATIVE FINDINGS: Patient was examined operating room number 11.  The teeth were identified for extraction. The patient was noted be affected by chronic periodontitis, chronic apical periodontitis, dental caries, retained root segments, loose teeth, and bilateral mandibular lingual exostoses.   DESCRIPTION OF PROCEDURE: Patient was brought to the main operating room number 11. Patient was then placed in the supine position on the operating table. General anesthesia was then induced per the anesthesia team. The patient was then prepped and draped in the usual manner for dental medicine procedure. A timeout was performed. The patient was identified and procedures were verified. A throat pack was placed at this time. The oral cavity was then thoroughly examined with the findings noted above. The patient was then ready for dental medicine procedure as follows:  Local anesthesia was then administered sequentially with a total utilization of 6 carpules each containing 34 mg of lidocaine with 0.017 mg of epinephrine as well as 2 carpules  each containing 9 mg bupivacaine with 0.009 mg of epinephrine.  The Maxillary left and right quadrants were first approached. Anesthesia was then delivered utilizing infiltration with lidocaine with epinephrine. A #15 blade incision was then made from the maxillary right tuberosity and extended to the distal of #14.  A  surgical flap was then carefully reflected. The remaining maxillary teeth were then subluxated with a series of straight elevators. Tooth numbers 2, 3, 4, 5, 6, 7, 8, 9, 10, 11, 12, and 13 were then removed with a  150 forceps without complications. Alveoloplasty was then performed utilizing a ronguers and bone file to help achieve primary closure. The surgical site was then irrigated with copious amounts of sterile saline. The  tissues were approximated and trimmed appropriately. The surgical site was then closed from the maxillary right tuberosity and extended the mesial #8 utilizing 3-0 Chromic Gut suture in a continuous erupted suture technique x1.  The maxillary left surgical site was then closed from the distal #14 extended the mesial #9 utilizing 3-0 Chromic Gut suture in a continuous erupted suture technique x1.  At this point time, the mandibular quadrants were approached. The patient was given bilateral inferior alveolar nerve blocks and long buccal nerve blocks utilizing the bupivacaine with epinephrine. Further infiltration was then achieved utilizing the lidocaine with epinephrine. A 15 blade incision was then made from the distal of number 17 extended the distal #32.  A surgical flap was then carefully reflected.  The remaining mandibular teeth were then subluxated with a series straight elevators.  Tooth numbers 17, 18, 21, 22, 23, 24, 25, 27, 28, 29, 30, 31 were then removed with a 151 forceps without complications.  Alveoloplasty was then performed utilizing a rongeurs and bone file to help achieve primary closure. The mandibular lingual flaps were then reflected appropriately to expose the lateral exostoses.  The bilateral mandibular lingual exostoses were then reduced utilizing a surgical handpiece and bur and copious amounts sterile water.  Alveoloplasty was then again performed utilizing rongeurs and bone file.  The surgical sites were irrigated with copious amounts sterile saline x4.  The tissues were approximated and trimmed appropriately.  The mandibular left surgical site was then closed from the distal #17 extended the mesial #24 utilizing 3-0 Chromic Gut suture in a continuous erupted suture technique x1.  The mandibular right surgical site was then closed for the distal of #32 and extended the mesial of #25 utilizing 3-0 Chromic Gut suture in a continuous erupted suture technique x1.  2 individual interrupted  sutures were then placed further close surgical site.    At this point time, the entire mouth was irrigated with copious amounts of sterile saline. The patient was examined for complications, seeing none, the dental medicine procedure was deemed to be complete. The throat pack was removed at this time. An oral airway was then placed at the request of the anesthesia team. A series of 4 x 4 gauze moistened with Amicar 5% rinse were placed in the mouth to aid hemostasis. The patient was then handed over to the anesthesia team for final disposition. After an appropriate amount of time, the patient was extubated and taken to the postanesthsia care unit in good condition. All counts were correct for the dental medicine procedure.  The patient is to continue the Amicar 5% rinses postoperatively.  Patient is to rinse with 10 mils every hour for the next 10 hours and a swish and spit manner.  Lovenox therapy may be restarted this evening at 12 midnight at the discretion of the medical team.   [name], DDS.

## 2020-04-29 NOTE — Progress Notes (Signed)
Pt transported to Short Stay for procedure at this time.  Report called to [phone].

## 2020-04-30 ENCOUNTER — Encounter (HOSPITAL_COMMUNITY): Payer: Self-pay | Admitting: Dentistry

## 2020-04-30 LAB — PHOSPHORUS: Phosphorus: 4.2 mg/dL (ref 2.5–4.6)

## 2020-04-30 LAB — CBC WITH DIFFERENTIAL/PLATELET
Abs Immature Granulocytes: 0.07 10*3/uL (ref 0.00–0.07)
Basophils Absolute: 0 10*3/uL (ref 0.0–0.1)
Basophils Relative: 0 %
Eosinophils Absolute: 0.1 10*3/uL (ref 0.0–0.5)
Eosinophils Relative: 1 %
HCT: 28.6 % — ABNORMAL LOW (ref 39.0–52.0)
Hemoglobin: 9.1 g/dL — ABNORMAL LOW (ref 13.0–17.0)
Immature Granulocytes: 1 %
Lymphocytes Relative: 29 %
Lymphs Abs: 3.1 10*3/uL (ref 0.7–4.0)
MCH: 21.9 pg — ABNORMAL LOW (ref 26.0–34.0)
MCHC: 31.8 g/dL (ref 30.0–36.0)
MCV: 68.8 fL — ABNORMAL LOW (ref 80.0–100.0)
Monocytes Absolute: 1.4 10*3/uL — ABNORMAL HIGH (ref 0.1–1.0)
Monocytes Relative: 13 %
Neutro Abs: 6.2 10*3/uL (ref 1.7–7.7)
Neutrophils Relative %: 56 %
Platelets: 163 10*3/uL (ref 150–400)
RBC: 4.16 MIL/uL — ABNORMAL LOW (ref 4.22–5.81)
RDW: 21.9 % — ABNORMAL HIGH (ref 11.5–15.5)
WBC: 10.8 10*3/uL — ABNORMAL HIGH (ref 4.0–10.5)
nRBC: 0 % (ref 0.0–0.2)

## 2020-04-30 LAB — BASIC METABOLIC PANEL
Anion gap: 6 (ref 5–15)
BUN: <5 mg/dL — ABNORMAL LOW (ref 6–20)
CO2: 23 mmol/L (ref 22–32)
Calcium: 8.7 mg/dL — ABNORMAL LOW (ref 8.9–10.3)
Chloride: 95 mmol/L — ABNORMAL LOW (ref 98–111)
Creatinine, Ser: 0.41 mg/dL — ABNORMAL LOW (ref 0.61–1.24)
GFR calc Af Amer: >60 mL/min (ref >60)
GFR calc non Af Amer: >60 mL/min (ref >60)
Glucose, Bld: 107 mg/dL — ABNORMAL HIGH (ref 70–99)
Potassium: 3.9 mmol/L (ref 3.5–5.1)
Sodium: 124 mmol/L — ABNORMAL LOW (ref 135–145)

## 2020-04-30 LAB — MAGNESIUM: Magnesium: 1.6 mg/dL — ABNORMAL LOW (ref 1.7–2.4)

## 2020-04-30 MED ORDER — SODIUM CHLORIDE 0.9 % IR SOLN
200.0000 mL | Status: DC
  Administered 2020-04-30: 200 mL

## 2020-04-30 MED ORDER — POTASSIUM CHLORIDE IN NACL 20-0.9 MEQ/L-% IV SOLN
INTRAVENOUS | Status: DC
  Filled 2020-04-30 (×2): qty 1000

## 2020-04-30 MED ORDER — MAGNESIUM SULFATE 4 GM/100ML IV SOLN
4.0000 g | Freq: Once | INTRAVENOUS | Status: AC
  Administered 2020-04-30: 4 g via INTRAVENOUS
  Filled 2020-04-30: qty 100

## 2020-04-30 NOTE — Progress Notes (Signed)
Initial Nutrition Assessment  DOCUMENTATION CODES:   Obesity unspecified  INTERVENTION:   -RD will monitor for diet advancement and adjust supplement regimen as appropriate -MVI with minerals daily -Magic cup TID with meals, each supplement provides 290 kcal and 9 grams of protein  NUTRITION DIAGNOSIS:   Increased nutrient needs related to post-op healing as evidenced by estimated needs.  GOAL:   Patient will meet greater than or equal to 90% of their needs  MONITOR:   PO intake, Supplement acceptance, Diet advancement, Labs, Weight trends, Skin, I & O's  REASON FOR ASSESSMENT:   Consult Assessment of nutrition requirement/status  ASSESSMENT:   54 year old male with past medical history of schizophrenia, seizure disorder, biventricular heart failure (Echo 01/2020 EF 25-30% with right-sided heart failure), severe tricuspid regurgitation, nicotine dependence and COPD who presents to [facility] emergency department with suicidal ideation and seizure.  Pt admitted with hyponatremia with seizures and recurrent dental abscesses.   8/26- s/p OPERATIONS: 1. Multiple extraction of tooth numbers 2 -13, 17, 18, 21 -25, and 27 - 31. 2. 4 Quadrants of alveoloplasty 3.  Bilateral mandibular lingual exostoses reductions  Reviewed I/O's: +466 ml x 24 hours and -3.5 L since 04/16/20  UOP: 1.7 L x 24 hours  Spoke with pt at bedside, who was pleasant and in good spirits at time of visit. He reports he has a great appetite and was consuming most of the food he was provided off meal trays this admission. Pt consumed 100% of his clear liquid tray. Pt reports tolerating well, but wishing for more substantial foods. RD discussed potential for diet advancement and how he may require a mechanically altered diet depending on how he can tolerate solid foods. He reports his pain level is a 4, but the ice packs are helping with his swallowing and comfort.   Discussed importance of continued  good oral intake to promote post-operative healing.   Case discussed with MD, who was amenable to advance to full liquid diet for lunch.   Reviewed wt hx; wt has been stable since admission. He does not think he lost weight PTA.   Medications reviewed and include 0.9% NaCl with KCl 20 mEq/L infusion @ 50 ml/hr.   TOC team working diligently on group home placement.   Labs reviewed: Na: 124. Mg: 1.6.  NUTRITION - FOCUSED PHYSICAL EXAM:    Most Recent Value  Orbital Region No depletion  Upper Arm Region No depletion  Thoracic and Lumbar Region No depletion  Buccal Region No depletion  Temple Region No depletion  Clavicle Bone Region No depletion  Clavicle and Acromion Bone Region No depletion  Scapular Bone Region No depletion  Dorsal Hand No depletion  Patellar Region No depletion  Anterior Thigh Region No depletion  Posterior Calf Region No depletion  Edema (RD Assessment) None  Hair Reviewed  Eyes Reviewed  Mouth Reviewed  Skin Reviewed  Nails Reviewed       Diet Order:   Diet Order            Diet full liquid Room service appropriate? Yes; Fluid consistency: Thin  Diet effective now                 EDUCATION NEEDS:   Education needs have been addressed  Skin:  Skin Assessment: Reviewed RN Assessment  Last BM:  04/29/20  Height:   Ht Readings from Last 1 Encounters:  04/29/20 5\' 8"  (1.727 m)    Weight:   Wt Readings from  Last 1 Encounters:  04/29/20 95.1 kg    Ideal Body Weight:  70 kg  BMI:  Body mass index is 31.88 kg/m.  Estimated Nutritional Needs:   Kcal:  1900-2100  Protein:  90-105 grams  Fluid:  >1.9 L    Levada Schilling, RD, LDN, CDCES Registered Dietitian II Certified Diabetes Care and Education Specialist Please refer to [facility] for RD and/or RD on-call/weekend/after hours pager

## 2020-04-30 NOTE — Progress Notes (Signed)
POST OPERATIVE NOTE:  04/30/2020 [name] [id number]   VITALS: BP 134/72 (BP Location: Left Arm)   Pulse 82   Temp 98.4 F (36.9 C) (Axillary)   Resp 20   Ht 5\' 8"  (1.727 m)   Wt 95.1 kg   SpO2 98%   BMI 31.88 kg/m   LABS:  Lab Results  Component Value Date   WBC 10.8 (H) 04/30/2020   HGB 9.1 (L) 04/30/2020   HCT 28.6 (L) 04/30/2020   MCV 68.8 (L) 04/30/2020   PLT 163 04/30/2020   BMET    Component Value Date/Time   NA 124 (L) 04/30/2020 0400   K 3.9 04/30/2020 0400   CL 95 (L) 04/30/2020 0400   CO2 23 04/30/2020 0400   GLUCOSE 107 (H) 04/30/2020 0400   BUN <5 (L) 04/30/2020 0400   CREATININE 0.41 (L) 04/30/2020 0400   CALCIUM 8.7 (L) 04/30/2020 0400   GFRNONAA >60 04/30/2020 0400   GFRAA >60 04/30/2020 0400    Lab Results  Component Value Date   INR 1.1 02/19/2020   No results found for: PTT   Jim Dunn is status post extraction of remaining teeth with the veloplasty and preprosthetic surgery as needed in the operating room with general anesthesia on 04/29/2020.  SUBJECTIVE: Patient with minimal complaints today.  Patient did have some significant discomfort last evening.  EXAM: There is no sign of oral infection or ooze.  Sutures are intact.  Patient has significant intraoral and extraoral swelling. Patient is now completely edentulous.  ASSESSMENT: Post operative course is consistent with dental procedures performed the operating room with general anesthesia Loss of teeth due to extractions Patient is now edentulous.   PLAN: 1.  Use salt water rinses every 2 hours while awake and as needed to aid healing. 2.  Advance diet as tolerated per dietary consultation. 3.  Patient to follow-up with dental medicine for evaluation of healing and suture removal once discharged.   05/01/2020, DDS

## 2020-04-30 NOTE — Progress Notes (Addendum)
[facility] PROGRESS NOTE  Jim Dunn QJJ:[id number] DOB: 03-11-1966 DOA: 02/23/2020 PCP: System, Pcp Not In  Status: Inpatient---Remains inpatient appropriate because:Altered mental status and Unsafe d/c plan   Dispo: The patient is from: Home              Anticipated d/c is to: Group home              Anticipated d/c date is: > 3 days              Patient currently is not medically stable to d/c. **NEEDS SAFE DC PLAN** Currently awaiting placement, TOC team working on this.  Mother is currently working to obtain Medicaid/guardianship. Unsafe for discharge based on underlying schizophrenia and history of recurrent noncompliance which led to patient's current hospitalization  Discussed with LCSW/case management. Mother presented to hospital on 8/17 to work with patient regarding Medicaid application.  She also obtained a completed FL2 form which was requested by [facility].  **As of 8/26 TOC has received information from group home outside of [facility] that they are coming next week to review patient's chart and evaluate him for appropriateness for their facility.  Code Status: Full Family Communication: 8/26 updated [name] bedside [ [phone] or [phone]] DVT prophylaxis: 8/5 refused Lovenox therefore switched to SCDs since ambulating without difficulty Vaccination status: Vaccinated   HPI (time of admission): 54 year old male with past medical history of schizophrenia, seizure disorder, biventricular heart failure(Echo 01/2020 EF 25-30%with right-sided heart failure),severe tricuspid regurgitation, nicotine dependence and COPD who presents to [facility] emergency department with suicidal ideation and seizure.Patient explains that since he left the hospital several days ago(admitted6/17-6/19-was being managed for hyponatremia, thought to be multifactorial in origin. Patient left AMA on6/19 and that time had sodium of 132),he has been  experiencing increasing fatigue. This increasing fatigue is associated with bilateral lower extremity tingling. Symptomsweresimilar to what prompted his presentation on 6/17. Additionally, he reported feeling depressed withsuicidal ideation, withouta plan. Patient reportedthat he hadattempted suicide in the distant past using pills but was unsuccessful and never sought medical attention. While the patient was waiting in the waiting room, he experienced a witnessed seizure while attempting to walk to the bathroom. Patient was immediately brought back to the emergency department for further evaluation. Patient was found to have substantial recurrent hyponatremia of 117 with concurrent hypokalemia of 2.6 and hypomagnesium of 1.4. Patient was administered 1236mg  of intravenous Fosphenytoinand initiated on low rate normal saline infusion. Patient was administered 2 g of intravenous magnesium and a 30 mEq of potassium.  Patient's electrolytes have improved, no further seizure episodes at this time.  Echo showed EF of 25-30%, cardiology recommended outpatient follow-up but otherwise continue Coreg, torsemide and Entresto.    Subjective: Reports adequately controlled pain.  Difficulty eating clears and request something more substantial.  Objective: Vitals:   04/30/20 0804 04/30/20 1158  BP: 134/72 140/81  Pulse: 82 66  Resp: 20 18  Temp: 98.4 F (36.9 C) 97.6 F (36.4 C)  SpO2: 98% 95%    Intake/Output Summary (Last 24 hours) at 04/30/2020 1307 Last data filed at 04/30/2020 1217 Gross per 24 hour  Intake 2425.8 ml  Output 2225 ml  Net 200.8 ml   Filed Weights   04/28/20 0318 04/29/20 0420 04/29/20 0821  Weight: 95.2 kg 95.1 kg 95.1 kg    Exam:  Constitutional: NAD, calm, comfortable Oral: Oral packing in place status post full dental extraction.  Minimal bleeding on packing.  Face  remains swollen in the postoperative setting Respiratory: clear to auscultation bilaterally on  posterior exam, Normal respiratory effort at rest.  Room air Cardiovascular: Regular rate, no murmurs / rubs / gallops. No lower extremity edema.  No JVD.  Abdomen: no tenderness, Bowel sounds positive.    Neurologic: CN 2-12 grossly intact. Sensation intact,Strength 5/5 x all 4 extremities. Ambulates independently Psychiatric: Alert and oriented x 3 although does not have appropriate insight or capacity to manage care independently   Assessment/Plan: Recurrent dental abscesses status post full dental extraction on 8/26 Recurrent.  Initial episode in July that responded to IV antibiotics.  Dental caries and periapical abscess confirmed orthopantogram Had recurrence of her this weekend on 8/21 with again imaging confirming periapical periodontitis with abscesses involving the right upper molar and all of the nonincisor lower teeth Previously responded well to IV Unasyn so this was resumed on 8/21-WBC 8.0-continue Unasyn for a total of 10 days with ASD in place Status post full dental extraction on 8/26.  Anticipate patient may have variable oral intake so have initiated IV fluids at 50 cc/h, stopped usual Lasix, transitioned pain medications to morphine elixir with IV morphine for breakthrough pain Dental surgeon recommendation: salt water rinses every 2 hours while awake, okay to advance diet as tolerated which we have done to full liquids, and patient is to follow-up with dental medicine for evaluation of healing and suture removal once discharged.  Hyponatremia with seizures: Intravascular dehydration, resolved At time of admission diuretics torsemide & metolazone were held and subsequently  discontinued.   As of 8/9 Na+ down to 127 and further decreased to 126 initially went up to 28 but as of 8/12 after acute treatment for asymptomatic volume overload Weight had increased to 210.1 lbs and in positive balance I/O therefore associated decreased sodium suspected volume overload-below regarding  treatment regimen 8/13 after diuresis sodium has decreased to 122 therefore Lasix placed on hold for 48 hours  8/23 Lasix has been resumed for multiple days and sodium remained stable at 128 but as of 8/26 Lasix discontinued temporarily until able to tolerate oral diet better-see above 8/27 sodium decreased to 124 with utilization of dextrose containing IV fluids therefore IV fluids changed to normal saline only.  Lasix remains on hold.  Chronic congestive heart failure with reduced ejection fraction, 25%.-Associated right heart failure.   Recently treated for 2 days with twice daily Lasix dosing in context of asymptomatic heart failure exacerbation/weight gain. Na+ subsequently decreased to 126 and weight decreased from 210.1 lbs to 206.8 lbs-Lasix decreased to daily but baseline dosage increased from 40 mg to 60 mg -see above regarding sodium Echo May 2021-EF 25-30% Continue Coreg, Entresto, Daily weights with strict I/O-having excellent urinary output with current Lasix dosage 60 mg daily-unable to uptitrate Entresto due to lower range BP readings Counseled extensively by multiple providers regarding need to adhere to salt restricted diet  For surgery by Dr. Paulette Blanch will need cardiac follow-up after discharge and likely will need follow-up echo-if EF remains 30% or less would need to be referred to EP clinic for consideration for ICD implantation  Depression, Suicidal ideations: Resolved, seen by psychiatry.  Recommended to continue BuSpar, Neurontin, Invega.   Follow-up outpatient psychiatry.   Received his routine Invega shot 8/8, takes this monthly-next dose due 9/8 Increasing anxiety about impending surgery for 8/26 therefore Klonopin 0.5 mg TID prn ordered  Seizure disorder: Stable Secondary to hyponatremia and issues of nonadherence with medications prior to admission.   Dilantin level  subtherapeutic on 7/18 at 2.6 - Dilantin dosage increased to 200 mg twice daily-repeat level  on 7/25 also subtherapeutic to therefore dosage increased at recommendation of pharmacy to 200 in the morning and 300 at bedtime Repeat Dilantin level 7/30 has increased but still remains low at 6.4-discussed with pharmacist and have increased a.m. dose from 200 to 300 mg; repeat Dilantin level 8.7 on 8/4 -8/11 Dilantin level therapeutic at 11.7 -continue current dose 8/27 follow-up Dilantin level 12.9-unless showed signs of toxicity no indication to repeat Dilantin level for at least 6 weeks  Microcytic anemia Iron studies relatively normal, hemoglobin stable If has significant bleeding postoperatively may need to repeat CBC for several days.  OSA Continue nocturnal CPAP-since mask changed compliance has improved  COPD: stable. As needed bronchodilators  Hyperlipidemia: Daily Lipitor 40 mg  Hypokalemia/hypomagnesemia Replace as needed 8/27 magnesium back down to 1.6 and with his history of low EF have opted to give 4 g IV and repeat labs in a.m.  Prolonged QTC:  Appears to have resolved.   Can you Seroquel 200 mg daily.  Peripheral neuropathy Change gabapentin 300 mg QID to Lyrica 50 mg TID Repeat B12 level  Modest obesity Estimated body mass index is 31.88 kg/m as calculated from the following:   Height as of this encounter: 5\' 8"  (1.727 m).   Weight as of this encounter: 95.1 kg.      Data Reviewed: Basic Metabolic Panel: Recent Labs  Lab 04/26/20 0217 04/27/20 0754 04/28/20 0154 04/29/20 0443 04/30/20 0400  NA 128* 129* 128* 129* 124*  K 4.2 4.1 4.1 4.0 3.9  CL 94* 95* 96* 94* 95*  CO2 26 25 26 26 23   GLUCOSE 100* 111* 95 91 107*  BUN 10 6 10 8  <5*  CREATININE 0.55* 0.56* 0.47* 0.51* 0.41*  CALCIUM 9.0 9.6 9.0 9.2 8.7*  MG 2.1 1.7 1.8 1.8 1.6*  PHOS 4.6 4.7* 4.7* 4.8* 4.2   Liver Function Tests: Recent Labs  Lab 04/25/20 0220 04/26/20 0217 04/27/20 0754 04/28/20 0154 04/29/20 0443  AST 37 30 28 26  32  ALT 32 32 32 28 31  ALKPHOS 200*  209* 204* 194* 193*  BILITOT 0.6 0.3 0.6 0.5 0.2*  PROT 6.8 7.1 7.5 6.7 6.9  ALBUMIN 3.4* 3.6 3.9 3.4* 3.6   No results for input(s): LIPASE, AMYLASE in the last 168 hours. No results for input(s): AMMONIA in the last 168 hours. CBC: Recent Labs  Lab 04/26/20 0217 04/27/20 0754 04/28/20 0154 04/29/20 0443 04/30/20 0400  WBC 8.0 6.7 7.8 7.7 10.8*  NEUTROABS 3.6 2.8 3.2 3.2 6.2  HGB 10.6* 11.7* 10.2* 10.9* 9.1*  HCT 33.4* 37.0* 32.6* 34.8* 28.6*  MCV 68.4* 69.8* 68.3* 68.8* 68.8*  PLT 217 228 209 196 163   Cardiac Enzymes: No results for input(s): CKTOTAL, CKMB, CKMBINDEX, TROPONINI in the last 168 hours. BNP (last 3 results) Recent Labs    01/22/20 0410 02/24/20 0030  BNP 697.9* 184.7*    ProBNP (last 3 results) No results for input(s): PROBNP in the last 8760 hours.  CBG: No results for input(s): GLUCAP in the last 168 hours.  Recent Results (from the past 240 hour(s))  SARS Coronavirus 2 by RT PCR (hospital order, performed in [facility] hospital lab) Nasopharyngeal Nasopharyngeal Swab     Status: None   Collection Time: 04/28/20 12:38 PM   Specimen: Nasopharyngeal Swab  Result Value Ref Range Status   SARS Coronavirus 2 NEGATIVE NEGATIVE Final    Comment: (NOTE) SARS-CoV-2  target nucleic acids are NOT DETECTED.  The SARS-CoV-2 RNA is generally detectable in upper and lower respiratory specimens during the acute phase of infection. The lowest concentration of SARS-CoV-2 viral copies this assay can detect is 250 copies / mL. A negative result does not preclude SARS-CoV-2 infection and should not be used as the sole basis for treatment or other patient management decisions.  A negative result may occur with improper specimen collection / handling, submission of specimen other than nasopharyngeal swab, presence of viral mutation(s) within the areas targeted by this assay, and inadequate number of viral copies (<250 copies / mL). A negative result must be  combined with clinical observations, patient history, and epidemiological information.  Fact Sheet for Patients:   BoilerBrush.com.cy  Fact Sheet for Healthcare Providers: https://pope.com/  This test is not yet approved or  cleared by the [place] FDA and has been authorized for detection and/or diagnosis of SARS-CoV-2 by FDA under an Emergency Use Authorization (EUA).  This EUA will remain in effect (meaning this test can be used) for the duration of the COVID-19 declaration under Section 564(b)(1) of the Act, 21 U.S.C. section 360bbb-3(b)(1), unless the authorization is terminated or revoked sooner.  Performed at [facility], [address], [place], [place] [address]   Surgical pcr screen     Status: None   Collection Time: 04/28/20  3:37 PM   Specimen: Nasal Mucosa; Nasal Swab  Result Value Ref Range Status   MRSA, PCR NEGATIVE NEGATIVE Final   Staphylococcus aureus NEGATIVE NEGATIVE Final    Comment: (NOTE) The Xpert SA Assay (FDA approved for NASAL specimens in patients 54 years of age and older), is one component of a comprehensive surveillance program. It is not intended to diagnose infection nor to guide or monitor treatment. Performed at [facility], [address] [place], [place] [address]      Studies: No results found.  Scheduled Meds: . atorvastatin  40 mg Oral Daily  . busPIRone  10 mg Oral TID  . carvedilol  3.125 mg Oral BID WC  . chlorhexidine  15 mL Mouth Rinse BID  . clonazePAM  0.25 mg Oral Daily  . digoxin  0.125 mg Oral Daily  . fluticasone furoate-vilanterol  1 puff Inhalation Daily  . mouth rinse  15 mL Mouth Rinse q12n4p  . phenytoin  300 mg Oral BID  . pregabalin  75 mg Oral TID  . QUEtiapine  200 mg Oral Daily  . sacubitril-valsartan  1 tablet Oral BID   Continuous Infusions: . 0.9 % NaCl with KCl 20 mEq / L 50 mL/hr at 04/30/20 0829  . ampicillin-sulbactam (UNASYN)  IV 3 g (04/30/20 1042)  . lactated ringers 10 mL/hr at 04/29/20 0858  . lactated ringers    . sodium chloride irrigation      Principal Problem:   Hyponatremia Active Problems:   Chronic systolic CHF (congestive heart failure) (HCC)   Mixed hyperlipidemia   COPD (chronic obstructive pulmonary disease) (HCC)   Seizure disorder (HCC)   Paranoid schizophrenia (HCC)   Hypokalemia   Hypomagnesemia   Suicidal ideation   Noncompliance with medications   Subtherapeutic serum dilantin level   Seizure [facility])        Consultants:  Dentist/oral surgeon  Procedures:  None  Antibiotics: Anti-infectives (From admission, onward)   Start     Dose/Rate Route Frequency Ordered Stop   04/29/20 0800  ceFAZolin (ANCEF) IVPB 2g/100 mL premix  2 g 200 mL/hr over 30 Minutes Intravenous On call to O.R. 04/27/20 1814 04/29/20 0918   04/24/20 2100  Ampicillin-Sulbactam (UNASYN) 3 g in sodium chloride 0.9 % 100 mL IVPB        3 g 200 mL/hr over 30 Minutes Intravenous Every 6 hours 04/24/20 1936 05/04/20 2059   04/02/20 2200  amoxicillin-clavulanate (AUGMENTIN) 875-125 MG per tablet 1 tablet        1 tablet Oral Every 12 hours 04/02/20 1405 04/04/20 2130   03/29/20 0930  Ampicillin-Sulbactam (UNASYN) 3 g in sodium chloride 0.9 % 100 mL IVPB  Status:  Discontinued        3 g 200 mL/hr over 30 Minutes Intravenous Every 6 hours 03/29/20 0915 04/02/20 1405       Time spent: 25 minutes    Junious Silk ANP  [facility] Pager [phone]. If 7PM-7AM, please contact night-coverage at [email or web address] 04/30/2020, 1:07 PM  LOS: 67 days

## 2020-04-30 NOTE — Progress Notes (Signed)
S/p full dental extraction. Ok to add 10d stop date for Unasyn per Junious Silk [facility])  Ulyses Southward, PharmD, BCIDP, AAHIVP, CPP Infectious Disease Pharmacist 04/30/2020 10:09 AM

## 2020-04-30 NOTE — Progress Notes (Signed)
Pt had an  Uneventful night no active bleeding but blood stain dooling Jim Dunn I moderation. Oral care done as needed pain under control with pain medication tolerating Po well. Face slightly swollen and ice pack applied most of the shift intermittently. Will continue to monitor pt

## 2020-04-30 NOTE — Progress Notes (Signed)
Pt stated he has had dental work done and is not able to tolerate the pressure of the CPAP.  Pt doesn't want to wear CPAP tonight.

## 2020-04-30 NOTE — Plan of Care (Signed)
  Problem: Medication: Goal: Risk for medication side effects will decrease Outcome: Progressing   Problem: Health Behavior/Discharge Planning: Goal: Compliance with prescribed medication regimen will improve Outcome: Progressing

## 2020-05-01 LAB — CBC
HCT: 30.9 % — ABNORMAL LOW (ref 39.0–52.0)
Hemoglobin: 9.8 g/dL — ABNORMAL LOW (ref 13.0–17.0)
MCH: 21.9 pg — ABNORMAL LOW (ref 26.0–34.0)
MCHC: 31.7 g/dL (ref 30.0–36.0)
MCV: 69.1 fL — ABNORMAL LOW (ref 80.0–100.0)
Platelets: 180 10*3/uL (ref 150–400)
RBC: 4.47 MIL/uL (ref 4.22–5.81)
RDW: 22.4 % — ABNORMAL HIGH (ref 11.5–15.5)
WBC: 8.1 10*3/uL (ref 4.0–10.5)
nRBC: 0 % (ref 0.0–0.2)

## 2020-05-01 LAB — COMPREHENSIVE METABOLIC PANEL
ALT: 27 U/L (ref 0–44)
AST: 28 U/L (ref 15–41)
Albumin: 3.7 g/dL (ref 3.5–5.0)
Alkaline Phosphatase: 168 U/L — ABNORMAL HIGH (ref 38–126)
Anion gap: 9 (ref 5–15)
BUN: <5 mg/dL — ABNORMAL LOW (ref 6–20)
CO2: 24 mmol/L (ref 22–32)
Calcium: 9 mg/dL (ref 8.9–10.3)
Chloride: 97 mmol/L — ABNORMAL LOW (ref 98–111)
Creatinine, Ser: 0.49 mg/dL — ABNORMAL LOW (ref 0.61–1.24)
GFR calc Af Amer: >60 mL/min (ref >60)
GFR calc non Af Amer: >60 mL/min (ref >60)
Glucose, Bld: 101 mg/dL — ABNORMAL HIGH (ref 70–99)
Potassium: 3.9 mmol/L (ref 3.5–5.1)
Sodium: 130 mmol/L — ABNORMAL LOW (ref 135–145)
Total Bilirubin: 0.6 mg/dL (ref 0.3–1.2)
Total Protein: 6.9 g/dL (ref 6.5–8.1)

## 2020-05-01 LAB — MAGNESIUM: Magnesium: 1.6 mg/dL — ABNORMAL LOW (ref 1.7–2.4)

## 2020-05-01 LAB — PHOSPHORUS: Phosphorus: 4.1 mg/dL (ref 2.5–4.6)

## 2020-05-01 MED ORDER — MELATONIN 3 MG PO TABS: 3.0000 mg | ORAL_TABLET | Freq: Every evening | ORAL | Status: DC | PRN

## 2020-05-01 MED ORDER — MAGNESIUM SULFATE 2 GM/50ML IV SOLN
2.0000 g | Freq: Once | INTRAVENOUS | Status: AC
  Administered 2020-05-01: 2 g via INTRAVENOUS
  Filled 2020-05-01: qty 50

## 2020-05-01 MED ORDER — FUROSEMIDE 40 MG PO TABS
60.0000 mg | ORAL_TABLET | Freq: Every day | ORAL | Status: DC
  Administered 2020-05-02 – 2020-05-05 (×4): 60 mg via ORAL
  Filled 2020-05-01 (×4): qty 1

## 2020-05-01 NOTE — Progress Notes (Signed)
PROGRESS NOTE    Jim Dunn  SPQ:[id number] DOB: Sep 30, 1965 DOA: 02/23/2020 PCP: System, Pcp Not In     Brief Narrative:  Jim Dunn is a 54 year old male with past medical history of schizophrenia, seizure disorder, biventricular heart failure(Echo 01/2020 EF 25-30%with right-sided heart failure),severe tricuspid regurgitation, nicotine dependence and COPD who presents to [facility] emergency department with suicidal ideation and seizure.Patient explains that since he left the hospital several days ago(admitted6/17-6/19-was being managed for hyponatremia, thought to be multifactorial in origin. Patient left AMA on6/19 and that time had sodium of 132),he has been experiencing increasing fatigue. This increasing fatigue is associated with bilateral lower extremity tingling. Symptomsweresimilar to what prompted his presentation on 6/17. Additionally, he reported feeling depressed withsuicidal ideation, withouta plan. Patient reportedthat he hadattempted suicide in the distant past using pills but was unsuccessful and never sought medical attention. While the patient was waiting in the waiting room, he experienced a witnessed seizure while attempting to walk to the bathroom. Patient was immediately brought back to the emergency department for further evaluation. Patient was found to have substantial recurrent hyponatremia of 117 with concurrent hypokalemia of 2.6 and hypomagnesium of 1.4. Patient was administered 1236mg  of intravenous Fosphenytoinand initiated on low rate normal saline infusion. Patient was administered 2 g of intravenous magnesium and a 30 mEq of potassium.Patient's electrolytes have improved, no further seizure episodes at this time. Echo showed EF of 25-30%, cardiology recommended outpatient follow-up but otherwise continue Coreg, torsemide and Entresto.   New events last 24 hours / Subjective: Doing well this morning. Asking for increased dose of  ambien. Has been tolerating soft diet without issues.   Assessment & Plan:   Principal Problem:   Hyponatremia Active Problems:   Chronic systolic CHF (congestive heart failure) (HCC)   Mixed hyperlipidemia   COPD (chronic obstructive pulmonary disease) (HCC)   Seizure disorder (HCC)   Paranoid schizophrenia (HCC)   Hypokalemia   Hypomagnesemia   Suicidal ideation   Noncompliance with medications   Subtherapeutic serum dilantin level   Seizure (HCC)   Hyponatremia with seizures -Sodium level stable today 130 -Stop IVF as tolerating PO  -Trend BMP   Recurrent dental abscesses -Status post full dental extraction 8/26 -Doing well, stop IV fluid, tolerating soft diet -Continue Unasyn -Follow-up with Dr. 9/26 outpatient  Chronic systolic CHF -Continue Coreg, Entresto, digoxin -Resume lasix tomorrow  -Follow-up with cardiology outpatient  Depression, suicidal ideation -Appreciate psychiatry -Continue BuSpar, Invega, Seroquel  Seizure disorder -Continue Dilantin  OSA -Continue CPAP  Hyperlipidemia -Continue Lipitor  Hypomagnesemia -Replace, trend  Insomnia -Continue Ambien, add melatonin   DVT prophylaxis:  SCDs Start: 04/29/20 1219 Place and maintain sequential compression device Start: 04/08/20 1416  Code Status: Full Family Communication: None at bedside  Disposition Plan:   Status is: Inpatient  Remains inpatient appropriate because:Unsafe d/c plan   Dispo: The patient is from: Home              Anticipated d/c is to: Group home              Anticipated d/c date is: > 3 days              Patient currently is medically stable to d/c.    Antimicrobials:  Anti-infectives (From admission, onward)   Start     Dose/Rate Route Frequency Ordered Stop   04/29/20 0800  ceFAZolin (ANCEF) IVPB 2g/100 mL premix        2 g 200 mL/hr over 30 Minutes  Intravenous On call to O.R. 04/27/20 1814 04/29/20 0918   04/24/20 2100  Ampicillin-Sulbactam  (UNASYN) 3 g in sodium chloride 0.9 % 100 mL IVPB        3 g 200 mL/hr over 30 Minutes Intravenous Every 6 hours 04/24/20 1936 05/04/20 2059   04/02/20 2200  amoxicillin-clavulanate (AUGMENTIN) 875-125 MG per tablet 1 tablet        1 tablet Oral Every 12 hours 04/02/20 1405 04/04/20 2130   03/29/20 0930  Ampicillin-Sulbactam (UNASYN) 3 g in sodium chloride 0.9 % 100 mL IVPB  Status:  Discontinued        3 g 200 mL/hr over 30 Minutes Intravenous Every 6 hours 03/29/20 0915 04/02/20 1405        Objective: Vitals:   04/30/20 1952 04/30/20 2335 05/01/20 0339 05/01/20 0845  BP: 108/66 (!) 151/97 123/66 129/79  Pulse: 76 67 78 81  Resp: 16 17 18 20   Temp: 97.7 F (36.5 C) 97.7 F (36.5 C) 98 F (36.7 C) (!) 97.5 F (36.4 C)  TempSrc: Oral Oral  Oral  SpO2: 98% 100% 95% 100%  Weight:      Height:        Intake/Output Summary (Last 24 hours) at 05/01/2020 1045 Last data filed at 05/01/2020 0546 Gross per 24 hour  Intake 2422.95 ml  Output 3000 ml  Net -577.05 ml   Filed Weights   04/28/20 0318 04/29/20 0420 04/29/20 0821  Weight: 95.2 kg 95.1 kg 95.1 kg    Examination:  General exam: Appears calm and comfortable  Respiratory system: Clear to auscultation. Respiratory effort normal. No respiratory distress. No conversational dyspnea.  Cardiovascular system: S1 & S2 heard, RRR. No murmurs. No pedal edema. Gastrointestinal system: Abdomen is nondistended, soft and nontender. Normal bowel sounds heard. Central nervous system: Alert. No focal neurological deficits. Speech clear.  Extremities: Symmetric in appearance  Skin: No rashes, lesions or ulcers on exposed skin  Psychiatry:  Mood & affect appropriate.   Data Reviewed: I have personally reviewed following labs and imaging studies  CBC: Recent Labs  Lab 04/26/20 0217 04/26/20 0217 04/27/20 0754 04/28/20 0154 04/29/20 0443 04/30/20 0400 05/01/20 0152  WBC 8.0   < > 6.7 7.8 7.7 10.8* 8.1  NEUTROABS 3.6  --  2.8 3.2  3.2 6.2  --   HGB 10.6*   < > 11.7* 10.2* 10.9* 9.1* 9.8*  HCT 33.4*   < > 37.0* 32.6* 34.8* 28.6* 30.9*  MCV 68.4*   < > 69.8* 68.3* 68.8* 68.8* 69.1*  PLT 217   < > 228 209 196 163 180   < > = values in this interval not displayed.   Basic Metabolic Panel: Recent Labs  Lab 04/27/20 0754 04/28/20 0154 04/29/20 0443 04/30/20 0400 05/01/20 0152  NA 129* 128* 129* 124* 130*  K 4.1 4.1 4.0 3.9 3.9  CL 95* 96* 94* 95* 97*  CO2 25 26 26 23 24   GLUCOSE 111* 95 91 107* 101*  BUN 6 10 8  <5* <5*  CREATININE 0.56* 0.47* 0.51* 0.41* 0.49*  CALCIUM 9.6 9.0 9.2 8.7* 9.0  MG 1.7 1.8 1.8 1.6* 1.6*  PHOS 4.7* 4.7* 4.8* 4.2 4.1   GFR: Estimated Creatinine Clearance: 120.8 mL/min (A) (by C-G formula based on SCr of 0.49 mg/dL (L)). Liver Function Tests: Recent Labs  Lab 04/26/20 0217 04/27/20 0754 04/28/20 0154 04/29/20 0443 05/01/20 0152  AST 30 28 26  32 28  ALT 32 32 28 31 27   ALKPHOS 209* 204*  194* 193* 168*  BILITOT 0.3 0.6 0.5 0.2* 0.6  PROT 7.1 7.5 6.7 6.9 6.9  ALBUMIN 3.6 3.9 3.4* 3.6 3.7   No results for input(s): LIPASE, AMYLASE in the last 168 hours. No results for input(s): AMMONIA in the last 168 hours. Coagulation Profile: No results for input(s): INR, PROTIME in the last 168 hours. Cardiac Enzymes: No results for input(s): CKTOTAL, CKMB, CKMBINDEX, TROPONINI in the last 168 hours. BNP (last 3 results) No results for input(s): PROBNP in the last 8760 hours. HbA1C: No results for input(s): HGBA1C in the last 72 hours. CBG: No results for input(s): GLUCAP in the last 168 hours. Lipid Profile: No results for input(s): CHOL, HDL, LDLCALC, TRIG, CHOLHDL, LDLDIRECT in the last 72 hours. Thyroid Function Tests: No results for input(s): TSH, T4TOTAL, FREET4, T3FREE, THYROIDAB in the last 72 hours. Anemia Panel: No results for input(s): VITAMINB12, FOLATE, FERRITIN, TIBC, IRON, RETICCTPCT in the last 72 hours. Sepsis Labs: Recent Labs  Lab 04/24/20 1610  04/24/20 1848  LATICACIDVEN 0.7 1.1    Recent Results (from the past 240 hour(s))  SARS Coronavirus 2 by RT PCR (hospital order, performed in [facility] hospital lab) Nasopharyngeal Nasopharyngeal Swab     Status: None   Collection Time: 04/28/20 12:38 PM   Specimen: Nasopharyngeal Swab  Result Value Ref Range Status   SARS Coronavirus 2 NEGATIVE NEGATIVE Final    Comment: (NOTE) SARS-CoV-2 target nucleic acids are NOT DETECTED.  The SARS-CoV-2 RNA is generally detectable in upper and lower respiratory specimens during the acute phase of infection. The lowest concentration of SARS-CoV-2 viral copies this assay can detect is 250 copies / mL. A negative result does not preclude SARS-CoV-2 infection and should not be used as the sole basis for treatment or other patient management decisions.  A negative result may occur with improper specimen collection / handling, submission of specimen other than nasopharyngeal swab, presence of viral mutation(s) within the areas targeted by this assay, and inadequate number of viral copies (<250 copies / mL). A negative result must be combined with clinical observations, patient history, and epidemiological information.  Fact Sheet for Patients:   [employer]cy  Fact Sheet for Healthcare Providers: https://pope.com/  This test is not yet approved or  cleared by the [place] FDA and has been authorized for detection and/or diagnosis of SARS-CoV-2 by FDA under an Emergency Use Authorization (EUA).  This EUA will remain in effect (meaning this test can be used) for the duration of the COVID-19 declaration under Section 564(b)(1) of the Act, 21 U.S.C. section 360bbb-3(b)(1), unless the authorization is terminated or revoked sooner.  Performed at [facility], [address] [place], [place] [address]   Surgical pcr screen     Status: None   Collection Time: 04/28/20  3:37 PM    Specimen: Nasal Mucosa; Nasal Swab  Result Value Ref Range Status   MRSA, PCR NEGATIVE NEGATIVE Final   Staphylococcus aureus NEGATIVE NEGATIVE Final    Comment: (NOTE) The Xpert SA Assay (FDA approved for NASAL specimens in patients 5 years of age and older), is one component of a comprehensive surveillance program. It is not intended to diagnose infection nor to guide or monitor treatment. Performed at [facility], [address], [place], [place] [address]       Radiology Studies: No results found.    Scheduled Meds: . atorvastatin  40 mg Oral Daily  . busPIRone  10 mg Oral TID  . carvedilol  3.125 mg Oral BID WC  . chlorhexidine  15 mL Mouth Rinse BID  . clonazePAM  0.25 mg Oral Daily  . digoxin  0.125 mg Oral Daily  . fluticasone furoate-vilanterol  1 puff Inhalation Daily  . mouth rinse  15 mL Mouth Rinse q12n4p  . phenytoin  300 mg Oral BID  . pregabalin  75 mg Oral TID  . QUEtiapine  200 mg Oral Daily  . sacubitril-valsartan  1 tablet Oral BID   Continuous Infusions: . ampicillin-sulbactam (UNASYN) IV 3 g (05/01/20 0944)  . lactated ringers 10 mL/hr at 04/29/20 0858  . lactated ringers    . sodium chloride irrigation       LOS: 68 days      Time spent: 20 minutes   Noralee Stain, DO [facility] 05/01/2020, 10:45 AM   Available via Epic secure chat 7am-7pm After these hours, please refer to coverage provider listed on [email or web address]

## 2020-05-01 NOTE — Progress Notes (Signed)
Pt stated he still doesn't want to wear CPAP due to dental work done. CPAP in pt's room in case pt changes his mind.

## 2020-05-02 LAB — BASIC METABOLIC PANEL
Anion gap: 10 (ref 5–15)
BUN: 8 mg/dL (ref 6–20)
CO2: 25 mmol/L (ref 22–32)
Calcium: 9 mg/dL (ref 8.9–10.3)
Chloride: 97 mmol/L — ABNORMAL LOW (ref 98–111)
Creatinine, Ser: 0.49 mg/dL — ABNORMAL LOW (ref 0.61–1.24)
GFR calc Af Amer: >60 mL/min (ref >60)
GFR calc non Af Amer: >60 mL/min (ref >60)
Glucose, Bld: 92 mg/dL (ref 70–99)
Potassium: 4.2 mmol/L (ref 3.5–5.1)
Sodium: 132 mmol/L — ABNORMAL LOW (ref 135–145)

## 2020-05-02 LAB — MAGNESIUM: Magnesium: 1.7 mg/dL (ref 1.7–2.4)

## 2020-05-02 MED ORDER — AMOXICILLIN-POT CLAVULANATE 875-125 MG PO TABS
1.0000 | ORAL_TABLET | Freq: Two times a day (BID) | ORAL | Status: AC
  Administered 2020-05-02 – 2020-05-03 (×4): 1 via ORAL
  Filled 2020-05-02 (×4): qty 1

## 2020-05-02 MED ORDER — AMOXICILLIN-POT CLAVULANATE 875-125 MG PO TABS: 1.0000 | ORAL_TABLET | Freq: Two times a day (BID) | ORAL | Status: DC

## 2020-05-02 MED ORDER — MAGNESIUM OXIDE 400 (241.3 MG) MG PO TABS
400.0000 mg | ORAL_TABLET | Freq: Every day | ORAL | Status: DC
  Administered 2020-05-02 – 2020-06-08 (×38): 400 mg via ORAL
  Filled 2020-05-02 (×38): qty 1

## 2020-05-02 NOTE — Progress Notes (Signed)
PROGRESS NOTE    Jim Dunn  GEX:[id number] DOB: 04-05-1966 DOA: 02/23/2020 PCP: System, Pcp Not In     Brief Narrative:  Jim Dunn is a 54 year old male with past medical history of schizophrenia, seizure disorder, biventricular heart failure(Echo 01/2020 EF 25-30%with right-sided heart failure),severe tricuspid regurgitation, nicotine dependence and COPD who presents to [facility] emergency department with suicidal ideation and seizure.Patient explains that since he left the hospital several days ago(admitted6/17-6/19-was being managed for hyponatremia, thought to be multifactorial in origin. Patient left AMA on6/19 and that time had sodium of 132),he has been experiencing increasing fatigue. This increasing fatigue is associated with bilateral lower extremity tingling. Symptomsweresimilar to what prompted his presentation on 6/17. Additionally, he reported feeling depressed withsuicidal ideation, withouta plan. Patient reportedthat he hadattempted suicide in the distant past using pills but was unsuccessful and never sought medical attention. While the patient was waiting in the waiting room, he experienced a witnessed seizure while attempting to walk to the bathroom. Patient was immediately brought back to the emergency department for further evaluation. Patient was found to have substantial recurrent hyponatremia of 117 with concurrent hypokalemia of 2.6 and hypomagnesium of 1.4. Patient was administered 1236mg  of intravenous Fosphenytoinand initiated on low rate normal saline infusion. Patient was administered 2 g of intravenous magnesium and a 30 mEq of potassium.Patient's electrolytes have improved, no further seizure episodes at this time. Echo showed EF of 25-30%, cardiology recommended outpatient follow-up but otherwise continue Coreg, torsemide and Entresto.   New events last 24 hours / Subjective: No new complaints on examination.  Tolerating diet without  any issues.  Assessment & Plan:   Principal Problem:   Hyponatremia Active Problems:   Chronic systolic CHF (congestive heart failure) (HCC)   Mixed hyperlipidemia   COPD (chronic obstructive pulmonary disease) (HCC)   Seizure disorder (HCC)   Paranoid schizophrenia (HCC)   Hypokalemia   Hypomagnesemia   Suicidal ideation   Noncompliance with medications   Subtherapeutic serum dilantin level   Seizure (HCC)   Hyponatremia with seizures -Sodium level stable today 132 -Trend BMP   Recurrent dental abscesses -Status post full dental extraction 8/26 -Unasyn --> Augmentin -Follow-up with Dr. Robin Searing outpatient  Chronic systolic CHF -Continue Coreg, Entresto, digoxin -Resume lasix  -Follow-up with cardiology outpatient  Depression, suicidal ideation -Appreciate psychiatry -Continue BuSpar, Invega, Seroquel  Seizure disorder -Continue Dilantin  OSA -Continue CPAP  Hyperlipidemia -Continue Lipitor  Insomnia -Continue Ambien, add melatonin   DVT prophylaxis:  SCDs Start: 04/29/20 1219 Place and maintain sequential compression device Start: 04/08/20 1416  Code Status: Full Family Communication: None at bedside  Disposition Plan:   Status is: Inpatient  Remains inpatient appropriate because:Unsafe d/c plan   Dispo: The patient is from: Home              Anticipated d/c is to: Group home              Anticipated d/c date is: > 3 days              Patient currently is medically stable to d/c.    Antimicrobials:  Anti-infectives (From admission, onward)   Start     Dose/Rate Route Frequency Ordered Stop   05/02/20 1100  amoxicillin-clavulanate (AUGMENTIN) 875-125 MG per tablet 1 tablet  Status:  Discontinued        1 tablet Oral Every 12 hours 05/02/20 1048 05/02/20 1048   05/02/20 1100  amoxicillin-clavulanate (AUGMENTIN) 875-125 MG per tablet 1 tablet  1 tablet Oral Every 12 hours 05/02/20 1048 05/03/20 2359   04/29/20 0800  ceFAZolin (ANCEF)  IVPB 2g/100 mL premix        2 g 200 mL/hr over 30 Minutes Intravenous On call to O.R. 04/27/20 1814 04/29/20 0918   04/24/20 2100  Ampicillin-Sulbactam (UNASYN) 3 g in sodium chloride 0.9 % 100 mL IVPB  Status:  Discontinued        3 g 200 mL/hr over 30 Minutes Intravenous Every 6 hours 04/24/20 1936 05/02/20 1048   04/02/20 2200  amoxicillin-clavulanate (AUGMENTIN) 875-125 MG per tablet 1 tablet        1 tablet Oral Every 12 hours 04/02/20 1405 04/04/20 2130   03/29/20 0930  Ampicillin-Sulbactam (UNASYN) 3 g in sodium chloride 0.9 % 100 mL IVPB  Status:  Discontinued        3 g 200 mL/hr over 30 Minutes Intravenous Every 6 hours 03/29/20 0915 04/02/20 1405       Objective: Vitals:   05/01/20 2350 05/02/20 0350 05/02/20 0407 05/02/20 0755  BP: 134/73 138/75  (!) 147/77  Pulse: 85 81  81  Resp: 16 18  18   Temp: 97.9 F (36.6 C) 98 F (36.7 C)  97.7 F (36.5 C)  TempSrc: Axillary Axillary  Axillary  SpO2: 99% 98%  99%  Weight:   96.3 kg   Height:        Intake/Output Summary (Last 24 hours) at 05/02/2020 1049 Last data filed at 05/02/2020 0900 Gross per 24 hour  Intake 360 ml  Output 2350 ml  Net -1990 ml   Filed Weights   04/29/20 0420 04/29/20 0821 05/02/20 0407  Weight: 95.1 kg 95.1 kg 96.3 kg    Examination: General exam: Appears calm and comfortable     Data Reviewed: I have personally reviewed following labs and imaging studies  CBC: Recent Labs  Lab 04/26/20 0217 04/26/20 0217 04/27/20 0754 04/28/20 0154 04/29/20 0443 04/30/20 0400 05/01/20 0152  WBC 8.0   < > 6.7 7.8 7.7 10.8* 8.1  NEUTROABS 3.6  --  2.8 3.2 3.2 6.2  --   HGB 10.6*   < > 11.7* 10.2* 10.9* 9.1* 9.8*  HCT 33.4*   < > 37.0* 32.6* 34.8* 28.6* 30.9*  MCV 68.4*   < > 69.8* 68.3* 68.8* 68.8* 69.1*  PLT 217   < > 228 209 196 163 180   < > = values in this interval not displayed.   Basic Metabolic Panel: Recent Labs  Lab 04/27/20 0754 04/27/20 0754 04/28/20 0154 04/29/20 0443  04/30/20 0400 05/01/20 0152 05/02/20 0117  NA 129*   < > 128* 129* 124* 130* 132*  K 4.1   < > 4.1 4.0 3.9 3.9 4.2  CL 95*   < > 96* 94* 95* 97* 97*  CO2 25   < > 26 26 23 24 25   GLUCOSE 111*   < > 95 91 107* 101* 92  BUN 6   < > 10 8 <5* <5* 8  CREATININE 0.56*   < > 0.47* 0.51* 0.41* 0.49* 0.49*  CALCIUM 9.6   < > 9.0 9.2 8.7* 9.0 9.0  MG 1.7   < > 1.8 1.8 1.6* 1.6* 1.7  PHOS 4.7*  --  4.7* 4.8* 4.2 4.1  --    < > = values in this interval not displayed.   GFR: Estimated Creatinine Clearance: 121.6 mL/min (A) (by C-G formula based on SCr of 0.49 mg/dL (L)). Liver Function Tests: Recent Labs  Lab 04/26/20 0217 04/27/20 0754 04/28/20 0154 04/29/20 0443 05/01/20 0152  AST 30 28 26  32 28  ALT 32 32 28 31 27   ALKPHOS 209* 204* 194* 193* 168*  BILITOT 0.3 0.6 0.5 0.2* 0.6  PROT 7.1 7.5 6.7 6.9 6.9  ALBUMIN 3.6 3.9 3.4* 3.6 3.7   No results for input(s): LIPASE, AMYLASE in the last 168 hours. No results for input(s): AMMONIA in the last 168 hours. Coagulation Profile: No results for input(s): INR, PROTIME in the last 168 hours. Cardiac Enzymes: No results for input(s): CKTOTAL, CKMB, CKMBINDEX, TROPONINI in the last 168 hours. BNP (last 3 results) No results for input(s): PROBNP in the last 8760 hours. HbA1C: No results for input(s): HGBA1C in the last 72 hours. CBG: No results for input(s): GLUCAP in the last 168 hours. Lipid Profile: No results for input(s): CHOL, HDL, LDLCALC, TRIG, CHOLHDL, LDLDIRECT in the last 72 hours. Thyroid Function Tests: No results for input(s): TSH, T4TOTAL, FREET4, T3FREE, THYROIDAB in the last 72 hours. Anemia Panel: No results for input(s): VITAMINB12, FOLATE, FERRITIN, TIBC, IRON, RETICCTPCT in the last 72 hours. Sepsis Labs: No results for input(s): PROCALCITON, LATICACIDVEN in the last 168 hours.  Recent Results (from the past 240 hour(s))  SARS Coronavirus 2 by RT PCR (hospital order, performed in [facility] hospital lab)  Nasopharyngeal Nasopharyngeal Swab     Status: None   Collection Time: 04/28/20 12:38 PM   Specimen: Nasopharyngeal Swab  Result Value Ref Range Status   SARS Coronavirus 2 NEGATIVE NEGATIVE Final    Comment: (NOTE) SARS-CoV-2 target nucleic acids are NOT DETECTED.  The SARS-CoV-2 RNA is generally detectable in upper and lower respiratory specimens during the acute phase of infection. The lowest concentration of SARS-CoV-2 viral copies this assay can detect is 250 copies / mL. A negative result does not preclude SARS-CoV-2 infection and should not be used as the sole basis for treatment or other patient management decisions.  A negative result may occur with improper specimen collection / handling, submission of specimen other than nasopharyngeal swab, presence of viral mutation(s) within the areas targeted by this assay, and inadequate number of viral copies (<250 copies / mL). A negative result must be combined with clinical observations, patient history, and epidemiological information.  Fact Sheet for Patients:   [facility]  Fact Sheet for Healthcare Providers: 04/30/20  This test is not yet approved or  cleared by the [employer] FDA and has been authorized for detection and/or diagnosis of SARS-CoV-2 by FDA under an Emergency Use Authorization (EUA).  This EUA will remain in effect (meaning this test can be used) for the duration of the COVID-19 declaration under Section 564(b)(1) of the Act, 21 U.S.C. section 360bbb-3(b)(1), unless the authorization is terminated or revoked sooner.  Performed at [facility], [address] [place], [address] [place]   Surgical pcr screen     Status: None   Collection Time: 04/28/20  3:37 PM   Specimen: Nasal Mucosa; Nasal Swab  Result Value Ref Range Status   MRSA, PCR NEGATIVE NEGATIVE Final   Staphylococcus aureus NEGATIVE NEGATIVE Final    Comment:  (NOTE) The Xpert SA Assay (FDA approved for NASAL specimens in patients 50 years of age and older), is one component of a comprehensive surveillance program. It is not intended to diagnose infection nor to guide or monitor treatment. Performed at [facility], [address], [place], [address] [place]       Radiology Studies: No  results found.    Scheduled Meds: . amoxicillin-clavulanate  1 tablet Oral Q12H  . atorvastatin  40 mg Oral Daily  . busPIRone  10 mg Oral TID  . carvedilol  3.125 mg Oral BID WC  . chlorhexidine  15 mL Mouth Rinse BID  . clonazePAM  0.25 mg Oral Daily  . digoxin  0.125 mg Oral Daily  . fluticasone furoate-vilanterol  1 puff Inhalation Daily  . furosemide  60 mg Oral Daily  . magnesium oxide  400 mg Oral Daily  . mouth rinse  15 mL Mouth Rinse q12n4p  . phenytoin  300 mg Oral BID  . pregabalin  75 mg Oral TID  . QUEtiapine  200 mg Oral Daily  . sacubitril-valsartan  1 tablet Oral BID   Continuous Infusions: . sodium chloride irrigation       LOS: 69 days      Time spent: 10 minutes   Noralee Stain, DO [facility] 05/02/2020, 10:49 AM   Available via Epic secure chat 7am-7pm After these hours, please refer to coverage provider listed on [email or web address]

## 2020-05-02 NOTE — Progress Notes (Signed)
Pt continues to refuse to use CPAP.

## 2020-05-02 NOTE — Progress Notes (Signed)
Pt ambulated this shift in halls. Pt appropriate. Pt c/o mouth pain needing medications*1 this shift.

## 2020-05-03 LAB — BASIC METABOLIC PANEL
Anion gap: 9 (ref 5–15)
BUN: 6 mg/dL (ref 6–20)
CO2: 25 mmol/L (ref 22–32)
Calcium: 8.9 mg/dL (ref 8.9–10.3)
Chloride: 95 mmol/L — ABNORMAL LOW (ref 98–111)
Creatinine, Ser: 0.62 mg/dL (ref 0.61–1.24)
GFR calc Af Amer: >60 mL/min (ref >60)
GFR calc non Af Amer: >60 mL/min (ref >60)
Glucose, Bld: 105 mg/dL — ABNORMAL HIGH (ref 70–99)
Potassium: 4.2 mmol/L (ref 3.5–5.1)
Sodium: 129 mmol/L — ABNORMAL LOW (ref 135–145)

## 2020-05-03 MED ORDER — FUROSEMIDE 40 MG PO TABS: 60.0000 mg | ORAL_TABLET | Freq: Every day | ORAL | Status: DC

## 2020-05-03 NOTE — Progress Notes (Signed)
Patient continues to refuse CPAP 

## 2020-05-03 NOTE — Progress Notes (Signed)
[facility] PROGRESS NOTE  Jim Dunn AJL:[id number] DOB: Mar 25, 1966 DOA: 02/23/2020 PCP: System, Pcp Not In  Status: Inpatient---Remains inpatient appropriate because:Altered mental status and Unsafe d/c plan   Dispo: The patient is from: Home              Anticipated d/c is to: Group home              Anticipated d/c date is: > 3 days              Patient currently is not medically stable to d/c. **NEEDS SAFE DC PLAN** Currently awaiting placement, TOC team working on this.  Mother is currently working to obtain Medicaid/guardianship. Unsafe for discharge based on underlying schizophrenia and history of recurrent noncompliance which led to patient's current hospitalization  Discussed with LCSW/case management. Mother presented to hospital on 8/17 to work with patient regarding Medicaid application.  She also obtained a completed FL2 form which was requested by [facility].  **As of 8/26 TOC has received information from group home outside of [facility] that they are coming next week to review patient's chart and evaluate him for appropriateness for their facility.  Code Status: Full Family Communication: 8/26 updated patient's mother Jim Dunn bedside [ [phone] or [phone]] DVT prophylaxis: 8/5 refused Lovenox therefore switched to SCDs since ambulating without difficulty Vaccination status: Vaccinated   HPI (time of admission): 54 year old male with past medical history of schizophrenia, seizure disorder, biventricular heart failure(Echo 01/2020 EF 25-30%with right-sided heart failure),severe tricuspid regurgitation, nicotine dependence and COPD who presents to [facility] emergency department with suicidal ideation and seizure.Patient explains that since he left the hospital several days ago(admitted6/17-6/19-was being managed for hyponatremia, thought to be multifactorial in origin. Patient left AMA on6/19 and that time had sodium of 132),he has been  experiencing increasing fatigue. This increasing fatigue is associated with bilateral lower extremity tingling. Symptomsweresimilar to what prompted his presentation on 6/17. Additionally, he reported feeling depressed withsuicidal ideation, withouta plan. Patient reportedthat he hadattempted suicide in the distant past using pills but was unsuccessful and never sought medical attention. While the patient was waiting in the waiting room, he experienced a witnessed seizure while attempting to walk to the bathroom. Patient was immediately brought back to the emergency department for further evaluation. Patient was found to have substantial recurrent hyponatremia of 117 with concurrent hypokalemia of 2.6 and hypomagnesium of 1.4. Patient was administered 1236mg  of intravenous Fosphenytoinand initiated on low rate normal saline infusion. Patient was administered 2 g of intravenous magnesium and a 30 mEq of potassium.  Patient's electrolytes have improved, no further seizure episodes at this time.  Echo showed EF of 25-30%, cardiology recommended outpatient follow-up but otherwise continue Coreg, torsemide and Entresto.    Subjective: Ports pain is adequately controlled but CPAP mask still remains uncomfortable. Bleeding from the mouth Asked when he would be fitted for dentures.  Objective: Vitals:   05/03/20 0734 05/03/20 0811  BP: (!) 161/92   Pulse: 75   Resp: 18   Temp: 97.8 F (36.6 C)   SpO2: 100% 99%    Intake/Output Summary (Last 24 hours) at 05/03/2020 1053 Last data filed at 05/02/2020 1200 Gross per 24 hour  Intake 420 ml  Output 800 ml  Net -380 ml   Filed Weights   04/29/20 0821 05/02/20 0407 05/02/20 2350  Weight: 95.1 kg 96.3 kg 96.2 kg    Exam:  Constitutional: NAD, calm, comfortable Oral: Oral packing in place status post full dental  extraction.  Minimal bleeding on packing.  Face remains swollen in the postoperative setting Respiratory: clear to  auscultation bilaterally on posterior exam, Normal respiratory effort at rest.  Room air Cardiovascular: Regular rate, no murmurs / rubs / gallops. No lower extremity edema.  No JVD.  Abdomen: no tenderness, Bowel sounds positive.    Neurologic: CN 2-12 grossly intact. Sensation intact,Strength 5/5 x all 4 extremities. Ambulates independently Psychiatric: Alert and oriented x 3 although does not have appropriate insight or capacity to manage care independently   Assessment/Plan: Recurrent dental abscesses status post full dental extraction on 8/26 Recurrent.  Initial episode in July that responded to IV antibiotics.  Dental caries and periapical abscess confirmed orthopantogram Had recurrence of her this weekend on 8/21 with again imaging confirming periapical periodontitis with abscesses involving the right upper molar and all of the nonincisor lower teeth Has been treated with IV Unasyn and transition to Augmentin with ASD scheduled for today 8/30 Status post full dental extraction on 8/26.  Anticipate patient may have variable oral intake so have initiated IV fluids at 50 cc/h, stopped usual Lasix, transitioned pain medications to morphine elixir with IV morphine for breakthrough pain Dental surgeon recommendation: salt water rinses every 2 hours while awake, okay to advance diet as tolerated which we have done to full liquids, and patient is to follow-up with dental medicine for evaluation of healing and suture removal once discharged.  Hyponatremia with seizures: Intravascular dehydration, resolved At time of admission diuretics torsemide & metolazone were held and subsequently  discontinued.   As of 8/9 Na+ down to 127 and further decreased to 126 initially went up to 28 but as of 8/12 after acute treatment for asymptomatic volume overload Weight had increased to 210.1 lbs and in positive balance I/O therefore associated decreased sodium suspected volume overload-below regarding treatment  regimen 8/13 after diuresis sodium has decreased to 122 -Lasix briefly held 8/23 Lasix resumed but back on hold in anticipation of upcoming surgery 8/30 Eating well-Na 129 - will resume Lasix and follow sodium in 48 hours  Chronic congestive heart failure with reduced ejection fraction, 25%.-Associated right heart failure.   Recently treated for 2 days with twice daily Lasix dosing in context of asymptomatic heart failure exacerbation/weight gain. Na+ subsequently decreased to 126 and weight decreased from 210.1 lbs to 206.8 lbs-Lasix decreased to daily but baseline dosage increased from 40 mg to 60 mg -see above regarding sodium and Lasix adjustments Echo May 2021-EF 25-30% Continue Coreg, Entresto, Daily weights with strict I/O-having excellent urinary output with current Lasix dosage 60 mg daily-unable to uptitrate Entresto due to lower range BP readings Counseled extensively by multiple providers regarding need to adhere to salt restricted diet  Cardiology/Dr. Sharyn Lull recommended cardiac follow-up after discharge and likely will need follow-up echo-if EF remains 30% or less would need to be referred to EP clinic for consideration for ICD implantation  Depression, Suicidal ideations: Resolved, seen by psychiatry.  Recommended to continue BuSpar, Neurontin, Invega.   Follow-up outpatient psychiatry.   Received his routine Invega shot 8/8, takes this monthly-next dose due 9/8 Increasing anxiety about impending surgery for 8/26 therefore Klonopin 0.5 mg TID prn ordered  Seizure disorder: Stable Secondary to hyponatremia and issues of nonadherence with medications prior to admission.   Dilantin level subtherapeutic on 7/18 at 2.6 - Dilantin dosage increased to 200 mg twice daily-repeat level on 7/25 also subtherapeutic to therefore dosage increased at recommendation of pharmacy to 200 in the morning and 300 at  bedtime Repeat Dilantin level 7/30 has increased but still remains low at  6.4-discussed with pharmacist and have increased a.m. dose from 200 to 300 mg; repeat Dilantin level 8.7 on 8/4 -8/11 Dilantin level therapeutic at 11.7 -continue current dose 8/27 follow-up Dilantin level 12.9-unless showed signs of toxicity no indication to repeat Dilantin level for at least 6 weeks  Microcytic anemia Iron studies relatively normal, hemoglobin stable If has significant bleeding postoperatively may need to repeat CBC for several days.  OSA Continue nocturnal CPAP-since mask changed compliance has improved Due to oral pain after recent dental extraction patient has not been consistently wearing the CPAP due to complaints of pain  COPD: stable. As needed bronchodilators  Hyperlipidemia: Daily Lipitor 40 mg  Hypokalemia/hypomagnesemia Replace as needed 8/27 magnesium back down to 1.6 and with his history of low EF have opted to give 4 g IV and repeat labs in a.m.  Prolonged QTC:  Appears to have resolved.   Can you Seroquel 200 mg daily.  Peripheral neuropathy Change gabapentin 300 mg QID to Lyrica 50 mg TID Repeat B12 level  Modest obesity Estimated body mass index is 32.23 kg/m as calculated from the following:   Height as of this encounter: 5\' 8"  (1.727 m).   Weight as of this encounter: 96.2 kg.      Data Reviewed: Basic Metabolic Panel: Recent Labs  Lab 04/27/20 0754 04/27/20 0754 04/28/20 0154 04/28/20 0154 04/29/20 0443 04/30/20 0400 05/01/20 0152 05/02/20 0117 05/03/20 0117  NA 129*   < > 128*   < > 129* 124* 130* 132* 129*  K 4.1   < > 4.1   < > 4.0 3.9 3.9 4.2 4.2  CL 95*   < > 96*   < > 94* 95* 97* 97* 95*  CO2 25   < > 26   < > 26 23 24 25 25   GLUCOSE 111*   < > 95   < > 91 107* 101* 92 105*  BUN 6   < > 10   < > 8 <5* <5* 8 6  CREATININE 0.56*   < > 0.47*   < > 0.51* 0.41* 0.49* 0.49* 0.62  CALCIUM 9.6   < > 9.0   < > 9.2 8.7* 9.0 9.0 8.9  MG 1.7   < > 1.8  --  1.8 1.6* 1.6* 1.7  --   PHOS 4.7*  --  4.7*  --  4.8* 4.2  4.1  --   --    < > = values in this interval not displayed.   Liver Function Tests: Recent Labs  Lab 04/27/20 0754 04/28/20 0154 04/29/20 0443 05/01/20 0152  AST 28 26 32 28  ALT 32 28 31 27   ALKPHOS 204* 194* 193* 168*  BILITOT 0.6 0.5 0.2* 0.6  PROT 7.5 6.7 6.9 6.9  ALBUMIN 3.9 3.4* 3.6 3.7   No results for input(s): LIPASE, AMYLASE in the last 168 hours. No results for input(s): AMMONIA in the last 168 hours. CBC: Recent Labs  Lab 04/27/20 0754 04/28/20 0154 04/29/20 0443 04/30/20 0400 05/01/20 0152  WBC 6.7 7.8 7.7 10.8* 8.1  NEUTROABS 2.8 3.2 3.2 6.2  --   HGB 11.7* 10.2* 10.9* 9.1* 9.8*  HCT 37.0* 32.6* 34.8* 28.6* 30.9*  MCV 69.8* 68.3* 68.8* 68.8* 69.1*  PLT 228 209 196 163 180   Cardiac Enzymes: No results for input(s): CKTOTAL, CKMB, CKMBINDEX, TROPONINI in the last 168 hours. BNP (last 3 results) Recent Labs  01/22/20 0410 02/24/20 0030  BNP 697.9* 184.7*    ProBNP (last 3 results) No results for input(s): PROBNP in the last 8760 hours.  CBG: No results for input(s): GLUCAP in the last 168 hours.  Recent Results (from the past 240 hour(s))  SARS Coronavirus 2 by RT PCR (hospital order, performed in [facility] hospital lab) Nasopharyngeal Nasopharyngeal Swab     Status: None   Collection Time: 04/28/20 12:38 PM   Specimen: Nasopharyngeal Swab  Result Value Ref Range Status   SARS Coronavirus 2 NEGATIVE NEGATIVE Final    Comment: (NOTE) SARS-CoV-2 target nucleic acids are NOT DETECTED.  The SARS-CoV-2 RNA is generally detectable in upper and lower respiratory specimens during the acute phase of infection. The lowest concentration of SARS-CoV-2 viral copies this assay can detect is 250 copies / mL. A negative result does not preclude SARS-CoV-2 infection and should not be used as the sole basis for treatment or other patient management decisions.  A negative result may occur with improper specimen collection / handling, submission of  specimen other than nasopharyngeal swab, presence of viral mutation(s) within the areas targeted by this assay, and inadequate number of viral copies (<250 copies / mL). A negative result must be combined with clinical observations, patient history, and epidemiological information.  Fact Sheet for Patients:   BoilerBrush.com.cy  Fact Sheet for Healthcare Providers: https://pope.com/  This test is not yet approved or  cleared by the [place] FDA and has been authorized for detection and/or diagnosis of SARS-CoV-2 by FDA under an Emergency Use Authorization (EUA).  This EUA will remain in effect (meaning this test can be used) for the duration of the COVID-19 declaration under Section 564(b)(1) of the Act, 21 U.S.C. section 360bbb-3(b)(1), unless the authorization is terminated or revoked sooner.  Performed at [facility], [address], [place], [place] [address]   Surgical pcr screen     Status: None   Collection Time: 04/28/20  3:37 PM   Specimen: Nasal Mucosa; Nasal Swab  Result Value Ref Range Status   MRSA, PCR NEGATIVE NEGATIVE Final   Staphylococcus aureus NEGATIVE NEGATIVE Final    Comment: (NOTE) The Xpert SA Assay (FDA approved for NASAL specimens in patients 76 years of age and older), is one component of a comprehensive surveillance program. It is not intended to diagnose infection nor to guide or monitor treatment. Performed at [facility], [address], [place], [place] [address]      Studies: No results found.  Scheduled Meds: . amoxicillin-clavulanate  1 tablet Oral Q12H  . atorvastatin  40 mg Oral Daily  . busPIRone  10 mg Oral TID  . carvedilol  3.125 mg Oral BID WC  . chlorhexidine  15 mL Mouth Rinse BID  . clonazePAM  0.25 mg Oral Daily  . digoxin  0.125 mg Oral Daily  . fluticasone furoate-vilanterol  1 puff Inhalation Daily  . furosemide  60 mg Oral Daily  . magnesium oxide   400 mg Oral Daily  . mouth rinse  15 mL Mouth Rinse q12n4p  . phenytoin  300 mg Oral BID  . pregabalin  75 mg Oral TID  . QUEtiapine  200 mg Oral Daily  . sacubitril-valsartan  1 tablet Oral BID   Continuous Infusions: . sodium chloride irrigation      Principal Problem:   Hyponatremia Active Problems:   Chronic systolic CHF (congestive heart failure) (HCC)   Mixed hyperlipidemia   COPD (chronic obstructive pulmonary  disease) (HCC)   Seizure disorder (HCC)   Paranoid schizophrenia (HCC)   Hypokalemia   Hypomagnesemia   Suicidal ideation   Noncompliance with medications   Subtherapeutic serum dilantin level   Seizure (HCC)        Consultants:  Dentist/oral surgeon  Procedures:  None  Antibiotics: Anti-infectives (From admission, onward)   Start     Dose/Rate Route Frequency Ordered Stop   05/02/20 1100  amoxicillin-clavulanate (AUGMENTIN) 875-125 MG per tablet 1 tablet  Status:  Discontinued        1 tablet Oral Every 12 hours 05/02/20 1048 05/02/20 1048   05/02/20 1100  amoxicillin-clavulanate (AUGMENTIN) 875-125 MG per tablet 1 tablet        1 tablet Oral Every 12 hours 05/02/20 1048 05/03/20 2359   04/29/20 0800  ceFAZolin (ANCEF) IVPB 2g/100 mL premix        2 g 200 mL/hr over 30 Minutes Intravenous On call to O.R. 04/27/20 1814 04/29/20 0918   04/24/20 2100  Ampicillin-Sulbactam (UNASYN) 3 g in sodium chloride 0.9 % 100 mL IVPB  Status:  Discontinued        3 g 200 mL/hr over 30 Minutes Intravenous Every 6 hours 04/24/20 1936 05/02/20 1048   04/02/20 2200  amoxicillin-clavulanate (AUGMENTIN) 875-125 MG per tablet 1 tablet        1 tablet Oral Every 12 hours 04/02/20 1405 04/04/20 2130   03/29/20 0930  Ampicillin-Sulbactam (UNASYN) 3 g in sodium chloride 0.9 % 100 mL IVPB  Status:  Discontinued        3 g 200 mL/hr over 30 Minutes Intravenous Every 6 hours 03/29/20 0915 04/02/20 1405       Time spent: 15 minutes    Junious Silk ANP  [facility] Pager [phone]. If 7PM-7AM, please contact night-coverage at [email or web address] 05/03/2020, 10:53 AM  LOS: 70 days

## 2020-05-04 MED ORDER — ZOLPIDEM TARTRATE 5 MG PO TABS
5.0000 mg | ORAL_TABLET | Freq: Every evening | ORAL | Status: DC | PRN
  Administered 2020-05-04 – 2020-05-13 (×7): 5 mg via ORAL
  Filled 2020-05-04 (×8): qty 1

## 2020-05-04 MED ORDER — MELATONIN 3 MG PO TABS
3.0000 mg | ORAL_TABLET | Freq: Every evening | ORAL | Status: DC | PRN
  Administered 2020-05-18 – 2020-05-25 (×2): 3 mg via ORAL
  Filled 2020-05-04 (×2): qty 1

## 2020-05-04 NOTE — Progress Notes (Signed)
Patient refused CPAP HS.  

## 2020-05-04 NOTE — Progress Notes (Signed)
[facility] PROGRESS NOTE  Jim Dunn KGU:[id number] DOB: 1966-06-14 DOA: 02/23/2020 PCP: System, Pcp Not In  Status: Inpatient---Remains inpatient appropriate because:Altered mental status and Unsafe d/c plan   Dispo: The patient is from: Home              Anticipated d/c is to: Group home              Anticipated d/c date is: > 3 days              Patient currently is not medically stable to d/c. **NEEDS SAFE DC PLAN** Currently awaiting placement, TOC team working on this.  Mother is currently working to obtain Medicaid/guardianship. Unsafe for discharge based on underlying schizophrenia and history of recurrent noncompliance which led to patient's current hospitalization  Discussed with LCSW/case management. Mother presented to hospital on 8/17 to work with patient regarding Medicaid application.  She also obtained a completed FL2 form which was requested by [facility].  **As of 8/26 TOC has received information from group home outside of [facility] that they are coming this week to review patient's chart and evaluate him for appropriateness for their facility.  Code Status: Full Family Communication: 8/26 updated patient's mother Jim Dunn bedside [ [phone] or [phone]] DVT prophylaxis: 8/5 refused Lovenox therefore switched to SCDs since ambulating without difficulty Vaccination status: Vaccinated   HPI (time of admission): 54 year old male with past medical history of schizophrenia, seizure disorder, biventricular heart failure(Echo 01/2020 EF 25-30%with right-sided heart failure),severe tricuspid regurgitation, nicotine dependence and COPD who presents to [facility] emergency department with suicidal ideation and seizure.Patient explains that since he left the hospital several days ago(admitted6/17-6/19-was being managed for hyponatremia, thought to be multifactorial in origin. Patient left AMA on6/19 and that time had sodium of 132),he has been  experiencing increasing fatigue. This increasing fatigue is associated with bilateral lower extremity tingling. Symptomsweresimilar to what prompted his presentation on 6/17. Additionally, he reported feeling depressed withsuicidal ideation, withouta plan. Patient reportedthat he hadattempted suicide in the distant past using pills but was unsuccessful and never sought medical attention. While the patient was waiting in the waiting room, he experienced a witnessed seizure while attempting to walk to the bathroom. Patient was immediately brought back to the emergency department for further evaluation. Patient was found to have substantial recurrent hyponatremia of 117 with concurrent hypokalemia of 2.6 and hypomagnesium of 1.4. Patient was administered 1236mg  of intravenous Fosphenytoinand initiated on low rate normal saline infusion. Patient was administered 2 g of intravenous magnesium and a 30 mEq of potassium.  Patient's electrolytes have improved, no further seizure episodes at this time.  Echo showed EF of 25-30%, cardiology recommended outpatient follow-up but otherwise continue Coreg, torsemide and Entresto.    Subjective: Discussed with patient need to attempt to place CPAP mask to few hours overnight. Explained he had not been wearing mask due to concerns over this causing "dry socket".  Encouraged patient to wear CPAP since he is far enough out from surgery that this has not yet occurred and I doubt CPAP would contribute.  Objective: Vitals:   05/04/20 0300 05/04/20 0826  BP: 112/76 127/82  Pulse: 82 81  Resp: 18 16  Temp: 98 F (36.7 C) (!) 97.5 F (36.4 C)  SpO2: 99% 100%    Intake/Output Summary (Last 24 hours) at 05/04/2020 1030 Last data filed at 05/04/2020 0827 Gross per 24 hour  Intake 360 ml  Output --  Net 360 ml   05/06/2020  04/29/20 0821 05/02/20 0407 05/02/20 2350  Weight: 95.1 kg 96.3 kg 96.2 kg    Exam: Constitutional: NAD, calm,  comfortable Oral: Oral packing in place status post full dental extraction.  No bleeding from mouth.  Swelling has nearly resolved and speech more clear as edema has decreased. Respiratory: clear to auscultation bilaterally on posterior exam, Normal respiratory effort at rest.  Room air Cardiovascular: Regular rate, no murmurs / rubs / gallops. No lower extremity edema.  No JVD.  Abdomen: no tenderness, Bowel sounds positive.    Neurologic: CN 2-12 grossly intact. Sensation intact,Strength 5/5 x all 4 extremities. Ambulates independently Psychiatric: Alert and oriented x 3 although does not have appropriate insight or capacity to manage care independently   Assessment/Plan: Recurrent dental abscesses status post full dental extraction on 8/26 Recurrent.  Initial episode in July that responded to IV antibiotics.  Dental caries and periapical abscess confirmed orthopantogram Had recurrence of her this weekend on 8/21 with again imaging confirming periapical periodontitis with abscesses involving the right upper molar and all of the nonincisor lower teeth Has been treated with IV Unasyn and transition to Augmentin with ASD scheduled for today 8/30 Status post full dental extraction on 8/26.  Anticipate patient may have variable oral intake so have initiated IV fluids at 50 cc/h, stopped usual Lasix, transitioned pain medications to morphine elixir with IV morphine for breakthrough pain Dental surgeon recommendation: salt water rinses every 2 hours while awake, okay to advance diet as tolerated, and patient is to follow-up with dental medicine for evaluation of healing and suture removal once discharged.  Hyponatremia with seizures: Intravascular dehydration, resolved At time of admission diuretics torsemide & metolazone were held and subsequently  discontinued.   As of 8/9 Na+ down to 127 and further decreased to 126 initially went up to 28 but as of 8/12 after acute treatment for asymptomatic  volume overload Weight had increased to 210.1 lbs and in positive balance I/O therefore associated decreased sodium suspected volume overload-below regarding treatment regimen 8/13 after diuresis sodium has decreased to 122 -Lasix briefly held 8/23 Lasix resumed but back on hold in anticipation of upcoming surgery 8/30 Eating well-Na 129 -Lasix resumed-check electrolyte panel on 9/1  Chronic congestive heart failure with reduced ejection fraction, 25%.-Associated right heart failure.   Recently treated for 2 days with twice daily Lasix dosing in context of asymptomatic heart failure exacerbation/weight gain. Na+ subsequently decreased to 126 and weight decreased from 210.1 lbs to 206.8 lbs-Lasix decreased to daily but baseline dosage increased from 40 mg to 60 mg -see above regarding sodium and Lasix adjustments Echo May 2021-EF 25-30% Continue Coreg, Entresto, Lasix along with Daily weights with strict I/O--unable to uptitrate Entresto due to lower range BP readings Counseled extensively by multiple providers regarding need to adhere to salt restricted diet  Cardiology/Dr. Sharyn Lull recommended cardiac follow-up after discharge and likely will need follow-up echo-if EF remains 30% or less would need to be referred to EP clinic for consideration for ICD implantation  Depression, Suicidal ideations: Resolved, seen by psychiatry.  Recommended to continue BuSpar, Neurontin, Invega.   Follow-up outpatient psychiatry.   Received his routine Invega shot 8/8, takes this monthly-next dose due 9/8 Increasing anxiety about impending surgery for 8/26 therefore Klonopin 0.5 mg TID prn ordered  Seizure disorder: Stable Secondary to hyponatremia and issues of nonadherence with medications prior to admission.   Dilantin level subtherapeutic on 7/18 at 2.6 - Dilantin dosage increased to 200 mg twice daily-repeat level on 7/25  also subtherapeutic to therefore dosage increased at recommendation of pharmacy to  200 in the morning and 300 at bedtime Repeat Dilantin level 7/30 has increased but still remains low at 6.4-discussed with pharmacist and have increased a.m. dose from 200 to 300 mg; repeat Dilantin level 8.7 on 8/4 -8/11 Dilantin level therapeutic at 11.7 -continue current dose 8/27 follow-up Dilantin level 12.9-unless showed signs of toxicity no indication to repeat Dilantin level for at least 6 weeks  Microcytic anemia Iron studies relatively normal, hemoglobin stable If has significant bleeding postoperatively may need to repeat CBC for several days.  OSA Continue nocturnal CPAP-since mask changed compliance has improved Due to oral pain after recent dental extraction patient has not been consistently wearing the CPAP due to complaints of pain  COPD: stable. As needed bronchodilators  Hyperlipidemia: Daily Lipitor 40 mg  Hypokalemia/hypomagnesemia Replace as needed 8/27 magnesium back down to 1.6 and with his history of low EF have opted to give 4 g IV and repeat labs in a.m.  Prolonged QTC:  Appears to have resolved.   Can you Seroquel 200 mg daily.  Peripheral neuropathy Change gabapentin 300 mg QID to Lyrica 50 mg TID Repeat B12 level  Modest obesity Estimated body mass index is 32.23 kg/m as calculated from the following:   Height as of this encounter: 5\' 8"  (1.727 m).   Weight as of this encounter: 96.2 kg.      Data Reviewed: Basic Metabolic Panel: Recent Labs  Lab 04/28/20 0154 04/28/20 0154 04/29/20 0443 04/30/20 0400 05/01/20 0152 05/02/20 0117 05/03/20 0117  NA 128*   < > 129* 124* 130* 132* 129*  K 4.1   < > 4.0 3.9 3.9 4.2 4.2  CL 96*   < > 94* 95* 97* 97* 95*  CO2 26   < > 26 23 24 25 25   GLUCOSE 95   < > 91 107* 101* 92 105*  BUN 10   < > 8 <5* <5* 8 6  CREATININE 0.47*   < > 0.51* 0.41* 0.49* 0.49* 0.62  CALCIUM 9.0   < > 9.2 8.7* 9.0 9.0 8.9  MG 1.8  --  1.8 1.6* 1.6* 1.7  --   PHOS 4.7*  --  4.8* 4.2 4.1  --   --    < > =  values in this interval not displayed.   Liver Function Tests: Recent Labs  Lab 04/28/20 0154 04/29/20 0443 05/01/20 0152  AST 26 32 28  ALT 28 31 27   ALKPHOS 194* 193* 168*  BILITOT 0.5 0.2* 0.6  PROT 6.7 6.9 6.9  ALBUMIN 3.4* 3.6 3.7   No results for input(s): LIPASE, AMYLASE in the last 168 hours. No results for input(s): AMMONIA in the last 168 hours. CBC: Recent Labs  Lab 04/28/20 0154 04/29/20 0443 04/30/20 0400 05/01/20 0152  WBC 7.8 7.7 10.8* 8.1  NEUTROABS 3.2 3.2 6.2  --   HGB 10.2* 10.9* 9.1* 9.8*  HCT 32.6* 34.8* 28.6* 30.9*  MCV 68.3* 68.8* 68.8* 69.1*  PLT 209 196 163 180   Cardiac Enzymes: No results for input(s): CKTOTAL, CKMB, CKMBINDEX, TROPONINI in the last 168 hours. BNP (last 3 results) Recent Labs    01/22/20 0410 02/24/20 0030  BNP 697.9* 184.7*    ProBNP (last 3 results) No results for input(s): PROBNP in the last 8760 hours.  CBG: No results for input(s): GLUCAP in the last 168 hours.  Recent Results (from the past 240 hour(s))  SARS Coronavirus 2 by  RT PCR (hospital order, performed in [facility] hospital lab) Nasopharyngeal Nasopharyngeal Swab     Status: None   Collection Time: 04/28/20 12:38 PM   Specimen: Nasopharyngeal Swab  Result Value Ref Range Status   SARS Coronavirus 2 NEGATIVE NEGATIVE Final    Comment: (NOTE) SARS-CoV-2 target nucleic acids are NOT DETECTED.  The SARS-CoV-2 RNA is generally detectable in upper and lower respiratory specimens during the acute phase of infection. The lowest concentration of SARS-CoV-2 viral copies this assay can detect is 250 copies / mL. A negative result does not preclude SARS-CoV-2 infection and should not be used as the sole basis for treatment or other patient management decisions.  A negative result may occur with improper specimen collection / handling, submission of specimen other than nasopharyngeal swab, presence of viral mutation(s) within the areas targeted by this  assay, and inadequate number of viral copies (<250 copies / mL). A negative result must be combined with clinical observations, patient history, and epidemiological information.  Fact Sheet for Patients:   BoilerBrush.com.cy  Fact Sheet for Healthcare Providers: https://pope.com/  This test is not yet approved or  cleared by the [place] FDA and has been authorized for detection and/or diagnosis of SARS-CoV-2 by FDA under an Emergency Use Authorization (EUA).  This EUA will remain in effect (meaning this test can be used) for the duration of the COVID-19 declaration under Section 564(b)(1) of the Act, 21 U.S.C. section 360bbb-3(b)(1), unless the authorization is terminated or revoked sooner.  Performed at [facility], [address] [place], [place] [address]   Surgical pcr screen     Status: None   Collection Time: 04/28/20  3:37 PM   Specimen: Nasal Mucosa; Nasal Swab  Result Value Ref Range Status   MRSA, PCR NEGATIVE NEGATIVE Final   Staphylococcus aureus NEGATIVE NEGATIVE Final    Comment: (NOTE) The Xpert SA Assay (FDA approved for NASAL specimens in patients 69 years of age and older), is one component of a comprehensive surveillance program. It is not intended to diagnose infection nor to guide or monitor treatment. Performed at [facility], [address] [place], [place] [address]      Studies: No results found.  Scheduled Meds: . atorvastatin  40 mg Oral Daily  . busPIRone  10 mg Oral TID  . carvedilol  3.125 mg Oral BID WC  . chlorhexidine  15 mL Mouth Rinse BID  . clonazePAM  0.25 mg Oral Daily  . digoxin  0.125 mg Oral Daily  . fluticasone furoate-vilanterol  1 puff Inhalation Daily  . furosemide  60 mg Oral Daily  . magnesium oxide  400 mg Oral Daily  . mouth rinse  15 mL Mouth Rinse q12n4p  . phenytoin  300 mg Oral BID  . pregabalin  75 mg Oral TID  . QUEtiapine  200 mg Oral Daily   . sacubitril-valsartan  1 tablet Oral BID   Continuous Infusions: . sodium chloride irrigation      Principal Problem:   Hyponatremia Active Problems:   Chronic systolic CHF (congestive heart failure) (HCC)   Mixed hyperlipidemia   COPD (chronic obstructive pulmonary disease) (HCC)   Seizure disorder (HCC)   Paranoid schizophrenia (HCC)   Hypokalemia   Hypomagnesemia   Suicidal ideation   Noncompliance with medications   Subtherapeutic serum dilantin level   Seizure [facility])        Consultants:  Dentist/oral surgeon  Procedures:  None  Antibiotics: Anti-infectives (From admission, onward)  Start     Dose/Rate Route Frequency Ordered Stop   05/02/20 1100  amoxicillin-clavulanate (AUGMENTIN) 875-125 MG per tablet 1 tablet  Status:  Discontinued        1 tablet Oral Every 12 hours 05/02/20 1048 05/02/20 1048   05/02/20 1100  amoxicillin-clavulanate (AUGMENTIN) 875-125 MG per tablet 1 tablet        1 tablet Oral Every 12 hours 05/02/20 1048 05/03/20 2143   04/29/20 0800  ceFAZolin (ANCEF) IVPB 2g/100 mL premix        2 g 200 mL/hr over 30 Minutes Intravenous On call to O.R. 04/27/20 1814 04/29/20 0918   04/24/20 2100  Ampicillin-Sulbactam (UNASYN) 3 g in sodium chloride 0.9 % 100 mL IVPB  Status:  Discontinued        3 g 200 mL/hr over 30 Minutes Intravenous Every 6 hours 04/24/20 1936 05/02/20 1048   04/02/20 2200  amoxicillin-clavulanate (AUGMENTIN) 875-125 MG per tablet 1 tablet        1 tablet Oral Every 12 hours 04/02/20 1405 04/04/20 2130   03/29/20 0930  Ampicillin-Sulbactam (UNASYN) 3 g in sodium chloride 0.9 % 100 mL IVPB  Status:  Discontinued        3 g 200 mL/hr over 30 Minutes Intravenous Every 6 hours 03/29/20 0915 04/02/20 1405       Time spent: 15 minutes    Junious Silk ANP  [facility] Pager [phone]. If 7PM-7AM, please contact night-coverage at [email or web address] 05/04/2020, 10:30 AM  LOS: 71 days

## 2020-05-05 LAB — BASIC METABOLIC PANEL
Anion gap: 9 (ref 5–15)
BUN: 6 mg/dL (ref 6–20)
CO2: 23 mmol/L (ref 22–32)
Calcium: 8.9 mg/dL (ref 8.9–10.3)
Chloride: 94 mmol/L — ABNORMAL LOW (ref 98–111)
Creatinine, Ser: 0.49 mg/dL — ABNORMAL LOW (ref 0.61–1.24)
GFR calc Af Amer: >60 mL/min (ref >60)
GFR calc non Af Amer: >60 mL/min (ref >60)
Glucose, Bld: 84 mg/dL (ref 70–99)
Potassium: 4 mmol/L (ref 3.5–5.1)
Sodium: 126 mmol/L — ABNORMAL LOW (ref 135–145)

## 2020-05-05 MED ORDER — CLONAZEPAM 0.25 MG PO TBDP
0.5000 mg | ORAL_TABLET | Freq: Two times a day (BID) | ORAL | Status: DC | PRN
  Administered 2020-05-05 – 2020-05-10 (×8): 0.5 mg via ORAL
  Filled 2020-05-05 (×8): qty 2

## 2020-05-05 NOTE — Progress Notes (Signed)
[facility] PROGRESS NOTE  Jim Dunn IHW:[id number] DOB: 21-Oct-1965 DOA: 02/23/2020 PCP: System, Pcp Not In  Status: Inpatient---Remains inpatient appropriate because:Altered mental status and Unsafe d/c plan   Dispo: The patient is from: Home              Anticipated d/c is to: Group home              Anticipated d/c date is: > 3 days              Patient currently is not medically stable to d/c. **NEEDS SAFE DC PLAN** Currently awaiting placement, TOC team working on this.  Mother is currently working to obtain Medicaid/guardianship. Unsafe for discharge based on underlying schizophrenia and history of recurrent noncompliance which led to patient's current hospitalization  Discussed with LCSW/case management. Mother presented to hospital on 8/17 to work with patient regarding Medicaid application.  She also obtained a completed FL2 form which was requested by [facility].  **As of 8/26 TOC has received information from group home outside of [facility] that they are coming the week of 8/30 to review patient's chart and evaluate him for appropriateness for their facility.  Code Status: Full Family Communication: 8/26 updated patient's mother Jim Dunn bedside [ [phone] or [phone]] DVT prophylaxis: 8/5 refused Lovenox therefore switched to SCDs since ambulating without difficulty Vaccination status: Vaccinated   HPI (time of admission): 54 year old male with past medical history of schizophrenia, seizure disorder, biventricular heart failure(Echo 01/2020 EF 25-30%with right-sided heart failure),severe tricuspid regurgitation, nicotine dependence and COPD who presents to [facility] emergency department with suicidal ideation and seizure.Patient explains that since he left the hospital several days ago(admitted6/17-6/19-was being managed for hyponatremia, thought to be multifactorial in origin. Patient left AMA on6/19 and that time had sodium of 132),he has  been experiencing increasing fatigue. This increasing fatigue is associated with bilateral lower extremity tingling. Symptomsweresimilar to what prompted his presentation on 6/17. Additionally, he reported feeling depressed withsuicidal ideation, withouta plan. Patient reportedthat he hadattempted suicide in the distant past using pills but was unsuccessful and never sought medical attention. While the patient was waiting in the waiting room, he experienced a witnessed seizure while attempting to walk to the bathroom. Patient was immediately brought back to the emergency department for further evaluation. Patient was found to have substantial recurrent hyponatremia of 117 with concurrent hypokalemia of 2.6 and hypomagnesium of 1.4. Patient was administered 1236mg  of intravenous Fosphenytoinand initiated on low rate normal saline infusion. Patient was administered 2 g of intravenous magnesium and a 30 mEq of potassium.  Patient's electrolytes have improved, no further seizure episodes at this time.  Echo showed EF of 25-30%, cardiology recommended outpatient follow-up but otherwise continue Coreg, torsemide and Entresto.    Subjective: .  Apparently had difficult night overnight with increasing anxiety which is typical for him as his long-acting antipsychotic injection is due 1 week from today. Still reports was unable to tolerate CPAP mask due to pain from recent dental extraction.  Objective: Vitals:   05/05/20 0752 05/05/20 1127  BP:  123/84  Pulse:  92  Resp:  20  Temp:  (!) 97.1 F (36.2 C)  SpO2: 98% 100%    Intake/Output Summary (Last 24 hours) at 05/05/2020 1157 Last data filed at 05/05/2020 0417 Gross per 24 hour  Intake 480 ml  Output 1300 ml  Net -820 ml   Filed Weights   04/29/20 0821 05/02/20 0407 05/02/20 2350  Weight: 95.1 kg 96.3 kg  96.2 kg    Exam: Constitutional: NAD, mildly anxious but reports is significantly improved as compared to overnight,  comfortable Oral: Oral packing in place status post full dental extraction.  No bleeding from mouth.  Swelling has nearly resolved and speech more clear as edema has decreased. Respiratory: clear to auscultation bilaterally on posterior exam, Normal respiratory effort at rest.  Room air Cardiovascular: Regular rate, no murmurs / rubs / gallops. No lower extremity edema.  No JVD.  Abdomen: no tenderness, Bowel sounds positive.    Neurologic: CN 2-12 grossly intact. Sensation intact,Strength 5/5 x all 4 extremities. Ambulates independently Psychiatric: Alert and oriented x 3 although does not have appropriate insight or capacity to manage care independently   Assessment/Plan: Recurrent dental abscesses status post full dental extraction on 8/26 Recurrent.  Initial episode in July that responded to IV antibiotics.  Dental caries and periapical abscess confirmed orthopantogram Had recurrence of her this weekend on 8/21 with again imaging confirming periapical periodontitis with abscesses involving the right upper molar and all of the nonincisor lower teeth Has been treated with IV Unasyn and transition to Augmentin with ASD scheduled for today 8/30 Status post full dental extraction on 8/26.  Anticipate patient may have variable oral intake so have initiated IV fluids at 50 cc/h, stopped usual Lasix, transitioned pain medications to morphine elixir with IV morphine for breakthrough pain Dental surgeon recommendation: salt water rinses every 2 hours while awake, okay to advance diet as tolerated, and patient is to follow-up with dental medicine for evaluation of healing and suture removal once discharged.  Hyponatremia with seizures: Intravascular dehydration, resolved At time of admission diuretics torsemide & metolazone were held and subsequently  discontinued.   As of 8/9 Na+ down to 127 and further decreased to 126 initially went up to 28 but as of 8/12 after acute treatment for asymptomatic  volume overload Weight had increased to 210.1 lbs and in positive balance I/O therefore associated decreased sodium suspected volume overload-below regarding treatment regimen 8/13 after diuresis sodium has decreased to 122 -Lasix briefly held 8/23 Lasix resumed but back on hold in anticipation of upcoming surgery 8/30 Eating well-Na 129 -Lasix resumed-check electrolyte panel on 9/1  Chronic congestive heart failure with reduced ejection fraction, 25%.-Associated right heart failure.   Recently treated for 2 days with twice daily Lasix dosing in context of asymptomatic heart failure exacerbation/weight gain. Na+ subsequently decreased to 126 and weight decreased from 210.1 lbs to 206.8 lbs-Lasix decreased to daily but baseline dosage increased from 40 mg to 60 mg -see above regarding sodium and Lasix adjustments Echo May 2021-EF 25-30% Continue Coreg, Entresto, Lasix along with Daily weights with strict I/O--unable to uptitrate Entresto due to lower range BP readings Counseled extensively by multiple providers regarding need to adhere to salt restricted diet  Cardiology/Dr. Sharyn Lull recommended cardiac follow-up after discharge and likely will need follow-up echo-if EF remains 30% or less would need to be referred to EP clinic for consideration for ICD implantation  Depression, Suicidal ideations: Resolved, seen by psychiatry.  Recommended to continue BuSpar, Neurontin, Invega.   Follow-up outpatient psychiatry.   Received his routine Invega shot 8/8, takes this monthly-next dose due 9/8 Increasing anxiety over the past 24 hours which is a typical trend he has been demonstrating within the 1 week prior to repeat dosing of Invega. Continue as needed Klonopin but will increase dose to 0.5 mg  Seizure disorder: Stable Secondary to hyponatremia and issues of nonadherence with medications prior to admission.  Dilantin level subtherapeutic on 7/18 at 2.6 - Dilantin dosage increased to 200 mg twice  daily-repeat level on 7/25 also subtherapeutic to therefore dosage increased at recommendation of pharmacy to 200 in the morning and 300 at bedtime Repeat Dilantin level 7/30 has increased but still remains low at 6.4-discussed with pharmacist and have increased a.m. dose from 200 to 300 mg; repeat Dilantin level 8.7 on 8/4 -8/11 Dilantin level therapeutic at 11.7 -continue current dose 8/27 follow-up Dilantin level 12.9-unless showed signs of toxicity no indication to repeat Dilantin level for at least 6 weeks  Microcytic anemia Iron studies relatively normal, hemoglobin stable If has significant bleeding postoperatively may need to repeat CBC for several days.  OSA Continue nocturnal CPAP-since mask changed compliance has improved Due to oral pain after recent dental extraction patient has not been consistently wearing the CPAP due to complaints of pain  COPD: stable. As needed bronchodilators  Hyperlipidemia: Daily Lipitor 40 mg  Hypokalemia/hypomagnesemia Replace as needed 8/27 magnesium back down to 1.6 and with his history of low EF have opted to give 4 g IV and repeat labs in a.m.  Prolonged QTC:  Appears to have resolved.   Can you Seroquel 200 mg daily.  Peripheral neuropathy Change gabapentin 300 mg QID to Lyrica 50 mg TID Repeat B12 level  Modest obesity Estimated body mass index is 32.23 kg/m as calculated from the following:   Height as of this encounter: 5\' 8"  (1.727 m).   Weight as of this encounter: 96.2 kg.      Data Reviewed: Basic Metabolic Panel: Recent Labs  Lab 04/29/20 0443 04/29/20 0443 04/30/20 0400 05/01/20 0152 05/02/20 0117 05/03/20 0117 05/05/20 0658  NA 129*   < > 124* 130* 132* 129* 126*  K 4.0   < > 3.9 3.9 4.2 4.2 4.0  CL 94*   < > 95* 97* 97* 95* 94*  CO2 26   < > 23 24 25 25 23   GLUCOSE 91   < > 107* 101* 92 105* 84  BUN 8   < > <5* <5* 8 6 6   CREATININE 0.51*   < > 0.41* 0.49* 0.49* 0.62 0.49*  CALCIUM 9.2   < >  8.7* 9.0 9.0 8.9 8.9  MG 1.8  --  1.6* 1.6* 1.7  --   --   PHOS 4.8*  --  4.2 4.1  --   --   --    < > = values in this interval not displayed.   Liver Function Tests: Recent Labs  Lab 04/29/20 0443 05/01/20 0152  AST 32 28  ALT 31 27  ALKPHOS 193* 168*  BILITOT 0.2* 0.6  PROT 6.9 6.9  ALBUMIN 3.6 3.7   No results for input(s): LIPASE, AMYLASE in the last 168 hours. No results for input(s): AMMONIA in the last 168 hours. CBC: Recent Labs  Lab 04/29/20 0443 04/30/20 0400 05/01/20 0152  WBC 7.7 10.8* 8.1  NEUTROABS 3.2 6.2  --   HGB 10.9* 9.1* 9.8*  HCT 34.8* 28.6* 30.9*  MCV 68.8* 68.8* 69.1*  PLT 196 163 180   Cardiac Enzymes: No results for input(s): CKTOTAL, CKMB, CKMBINDEX, TROPONINI in the last 168 hours. BNP (last 3 results) Recent Labs    01/22/20 0410 02/24/20 0030  BNP 697.9* 184.7*    ProBNP (last 3 results) No results for input(s): PROBNP in the last 8760 hours.  CBG: No results for input(s): GLUCAP in the last 168 hours.  Recent Results (from the past  240 hour(s))  SARS Coronavirus 2 by RT PCR (hospital order, performed in [facility] hospital lab) Nasopharyngeal Nasopharyngeal Swab     Status: None   Collection Time: 04/28/20 12:38 PM   Specimen: Nasopharyngeal Swab  Result Value Ref Range Status   SARS Coronavirus 2 NEGATIVE NEGATIVE Final    Comment: (NOTE) SARS-CoV-2 target nucleic acids are NOT DETECTED.  The SARS-CoV-2 RNA is generally detectable in upper and lower respiratory specimens during the acute phase of infection. The lowest concentration of SARS-CoV-2 viral copies this assay can detect is 250 copies / mL. A negative result does not preclude SARS-CoV-2 infection and should not be used as the sole basis for treatment or other patient management decisions.  A negative result may occur with improper specimen collection / handling, submission of specimen other than nasopharyngeal swab, presence of viral mutation(s) within  the areas targeted by this assay, and inadequate number of viral copies (<250 copies / mL). A negative result must be combined with clinical observations, patient history, and epidemiological information.  Fact Sheet for Patients:   BoilerBrush.com.cy  Fact Sheet for Healthcare Providers: https://pope.com/  This test is not yet approved or  cleared by the [place] FDA and has been authorized for detection and/or diagnosis of SARS-CoV-2 by FDA under an Emergency Use Authorization (EUA).  This EUA will remain in effect (meaning this test can be used) for the duration of the COVID-19 declaration under Section 564(b)(1) of the Act, 21 U.S.C. section 360bbb-3(b)(1), unless the authorization is terminated or revoked sooner.  Performed at [facility], [address], [place], [place] [address]   Surgical pcr screen     Status: None   Collection Time: 04/28/20  3:37 PM   Specimen: Nasal Mucosa; Nasal Swab  Result Value Ref Range Status   MRSA, PCR NEGATIVE NEGATIVE Final   Staphylococcus aureus NEGATIVE NEGATIVE Final    Comment: (NOTE) The Xpert SA Assay (FDA approved for NASAL specimens in patients 24 years of age and older), is one component of a comprehensive surveillance program. It is not intended to diagnose infection nor to guide or monitor treatment. Performed at [facility], [address] [place], [place] [address]      Studies: No results found.  Scheduled Meds: . atorvastatin  40 mg Oral Daily  . busPIRone  10 mg Oral TID  . carvedilol  3.125 mg Oral BID WC  . chlorhexidine  15 mL Mouth Rinse BID  . digoxin  0.125 mg Oral Daily  . fluticasone furoate-vilanterol  1 puff Inhalation Daily  . furosemide  60 mg Oral Daily  . magnesium oxide  400 mg Oral Daily  . mouth rinse  15 mL Mouth Rinse q12n4p  . phenytoin  300 mg Oral BID  . pregabalin  75 mg Oral TID  . QUEtiapine  200 mg Oral Daily  .  sacubitril-valsartan  1 tablet Oral BID   Continuous Infusions: . sodium chloride irrigation      Principal Problem:   Hyponatremia Active Problems:   Chronic systolic CHF (congestive heart failure) (HCC)   Mixed hyperlipidemia   COPD (chronic obstructive pulmonary disease) (HCC)   Seizure disorder (HCC)   Paranoid schizophrenia (HCC)   Hypokalemia   Hypomagnesemia   Suicidal ideation   Noncompliance with medications   Subtherapeutic serum dilantin level   Seizure [facility])        Consultants:  Dentist/oral surgeon  Procedures:  None  Antibiotics: Anti-infectives (From admission, onward)  Start     Dose/Rate Route Frequency Ordered Stop   05/02/20 1100  amoxicillin-clavulanate (AUGMENTIN) 875-125 MG per tablet 1 tablet  Status:  Discontinued        1 tablet Oral Every 12 hours 05/02/20 1048 05/02/20 1048   05/02/20 1100  amoxicillin-clavulanate (AUGMENTIN) 875-125 MG per tablet 1 tablet        1 tablet Oral Every 12 hours 05/02/20 1048 05/03/20 2143   04/29/20 0800  ceFAZolin (ANCEF) IVPB 2g/100 mL premix        2 g 200 mL/hr over 30 Minutes Intravenous On call to O.R. 04/27/20 1814 04/29/20 0918   04/24/20 2100  Ampicillin-Sulbactam (UNASYN) 3 g in sodium chloride 0.9 % 100 mL IVPB  Status:  Discontinued        3 g 200 mL/hr over 30 Minutes Intravenous Every 6 hours 04/24/20 1936 05/02/20 1048   04/02/20 2200  amoxicillin-clavulanate (AUGMENTIN) 875-125 MG per tablet 1 tablet        1 tablet Oral Every 12 hours 04/02/20 1405 04/04/20 2130   03/29/20 0930  Ampicillin-Sulbactam (UNASYN) 3 g in sodium chloride 0.9 % 100 mL IVPB  Status:  Discontinued        3 g 200 mL/hr over 30 Minutes Intravenous Every 6 hours 03/29/20 0915 04/02/20 1405       Time spent: 20 minutes    Junious Silk ANP  [facility] Pager [phone]. If 7PM-7AM, please contact night-coverage at [email or web address] 05/05/2020, 11:57 AM  LOS: 72 days

## 2020-05-05 NOTE — Progress Notes (Signed)
Nutrition Follow-up  DOCUMENTATION CODES:   Obesity unspecified  INTERVENTION:   -Continue Magic cup TID with meals, each supplement provides 290 kcal and 9 grams of protein -Continue MVI with minerals daily -Downgrade diet to dysphagia 3 (advanced mechanical soft) for ease of intake  NUTRITION DIAGNOSIS:   Increased nutrient needs related to post-op healing as evidenced by estimated needs.  Ongoing  GOAL:   Patient will meet greater than or equal to 90% of their needs  Progressing   MONITOR:   PO intake, Supplement acceptance, Diet advancement, Labs, Weight trends, Skin, I & O's  REASON FOR ASSESSMENT:   Consult Assessment of nutrition requirement/status  ASSESSMENT:   54 year old male with past medical history of schizophrenia, seizure disorder, biventricular heart failure (Echo 01/2020 EF 25-30% with right-sided heart failure), severe tricuspid regurgitation, nicotine dependence and COPD who presents to [facility] emergency department with suicidal ideation and seizure.  8/26- s/p OPERATIONS: 1. Multiple extraction of tooth numbers2-13, 17, 18, 21-25,and27-31. 2.4Quadrants of alveoloplasty 3.Bilateral mandibular lingual exostoses reductions  Reviewed I/O's: -220 ml x 24 hours and -6.7 L since admission  UOP: 1.3 L x 24 hours  Spoke with pt at bedside, who was pleasant and in good spirits today. He reports feeling better and is consuming almost all of the food he is given. Observed breakfast tray- pt consumed 100% of meal except for bacon and about 25% of grits.   Pt reports that he still has some mouth pain while chewing, especially harder foods, such as bacon. Pt is currently on a soft diet, which is a surgical soft diet (low fiber) designed for pt with GI illness or who recently underwent GI surgery. Pt would benefit more from a mechanically altered diet (ex. Dysphagia 3) which provides softer foods for ease of intake.   Wt has been stable  since admission.  RD discussed importance of continued good meal and supplement intake to promote healing. Pt very appreciative of visit.   Medications reviewed and include lasix, magnesium oxide, and dilantin.   Labs reviewed: Na: 126.   Diet Order:   Diet Order            DIET DYS 3 Room service appropriate? Yes with Assist; Fluid consistency: Thin  Diet effective now                 EDUCATION NEEDS:   Education needs have been addressed  Skin:  Skin Assessment: Reviewed RN Assessment  Last BM:  05/03/20  Height:   Ht Readings from Last 1 Encounters:  04/29/20 5\' 8"  (1.727 m)    Weight:   Wt Readings from Last 1 Encounters:  05/02/20 96.2 kg    Ideal Body Weight:  70 kg  BMI:  Body mass index is 32.23 kg/m.  Estimated Nutritional Needs:   Kcal:  1900-2100  Protein:  90-105 grams  Fluid:  >1.9 L    05/04/20, RD, LDN, CDCES Registered Dietitian II Certified Diabetes Care and Education Specialist Please refer to [facility] for RD and/or RD on-call/weekend/after hours pager

## 2020-05-05 NOTE — Progress Notes (Signed)
Pt called this RN and c/o of about loosing his mind, pt observed to be restless and anxious, requested for his daily Seroquel tab, Dr Toniann Fail (on call) paged, called back and okayed to give it, same given. Also his lower gum was observed to be bleeding, rolled up gauze applied for pressure, pt reassured and encouraged to play and listen his gospel music to calm him down, will however continue to monitor. Obasogie-Asidi, Mardella Nuckles Efe

## 2020-05-06 MED ORDER — FUROSEMIDE 40 MG PO TABS
40.0000 mg | ORAL_TABLET | Freq: Every day | ORAL | Status: DC
  Administered 2020-05-06 – 2020-05-07 (×2): 40 mg via ORAL
  Filled 2020-05-06 (×2): qty 1

## 2020-05-06 NOTE — Progress Notes (Addendum)
[facility] PROGRESS NOTE  Jim Dunn NTZ:[id number] DOB: 08/20/1966 DOA: 02/23/2020 PCP: System, Pcp Not In  Status: Inpatient---Remains inpatient appropriate because:Altered mental status and Unsafe d/c plan   Dispo: The patient is from: Home              Anticipated d/c is to: Group home              Anticipated d/c date is: > 3 days              Patient currently is not medically stable to d/c. **NEEDS SAFE DC PLAN** Currently awaiting placement, TOC team working on this.  Mother is currently working to obtain Medicaid/guardianship. Unsafe for discharge based on underlying schizophrenia and history of recurrent noncompliance which led to patient's current hospitalization  Discussed with LCSW/case management. Mother presented to hospital on 8/17 to work with patient regarding Medicaid application.  She also obtained a completed FL2 form which was requested by [facility].  **As of 8/26 TOC has received information from group home outside of [facility] that they are coming the week of 8/30 to review patient's chart and evaluate him for appropriateness for their facility.  Code Status: Full Family Communication: 8/26 updated patient's mother Jim Dunn bedside [ [phone] or [phone]] DVT prophylaxis: 8/5 refused Lovenox therefore switched to SCDs since ambulating without difficulty Vaccination status: Vaccinated   HPI (time of admission): 54 year old male with past medical history of schizophrenia, seizure disorder, biventricular heart failure(Echo 01/2020 EF 25-30%with right-sided heart failure),severe tricuspid regurgitation, nicotine dependence and COPD who presents to [facility] emergency department with suicidal ideation and seizure.Patient explains that since he left the hospital several days ago(admitted6/17-6/19-was being managed for hyponatremia, thought to be multifactorial in origin. Patient left AMA on6/19 and that time had sodium of 132),he has  been experiencing increasing fatigue. This increasing fatigue is associated with bilateral lower extremity tingling. Symptomsweresimilar to what prompted his presentation on 6/17. Additionally, he reported feeling depressed withsuicidal ideation, withouta plan. Patient reportedthat he hadattempted suicide in the distant past using pills but was unsuccessful and never sought medical attention. While the patient was waiting in the waiting room, he experienced a witnessed seizure while attempting to walk to the bathroom. Patient was immediately brought back to the emergency department for further evaluation. Patient was found to have substantial recurrent hyponatremia of 117 with concurrent hypokalemia of 2.6 and hypomagnesium of 1.4. Patient was administered 1236mg  of intravenous Fosphenytoinand initiated on low rate normal saline infusion. Patient was administered 2 g of intravenous magnesium and a 30 mEq of potassium.  Patient's electrolytes have improved, no further seizure episodes at this time.  Echo showed EF of 25-30%, cardiology recommended outpatient follow-up but otherwise continue Coreg, torsemide and Entresto.    Subjective: No specific complaints. States did not require sleep medicine overnight but forgot to put his CPAP on-states he will try tube placed tonight States sutures are falling out of his gums  Objective: Vitals:   05/06/20 0741 05/06/20 0827  BP: 121/74   Pulse: 88   Resp: 18   Temp: 98.6 F (37 C)   SpO2: 99% 98%    Intake/Output Summary (Last 24 hours) at 05/06/2020 0958 Last data filed at 05/05/2020 1830 Gross per 24 hour  Intake 480 ml  Output --  Net 480 ml   Filed Weights   04/29/20 0821 05/02/20 0407 05/02/20 2350  Weight: 95.1 kg 96.3 kg 96.2 kg    Exam: Constitutional: NAD, mildly anxious but reports  is significantly improved as compared to overnight, comfortable Oral: No bleeding from mouth.  Swelling has nearly resolved and speech more  clear as edema has decreased.  Patient reports sutures spontaneously falling off of gums Respiratory: clear to auscultation bilaterally on posterior exam, Normal respiratory effort at rest.  Room air Cardiovascular: Regular rate, no murmurs / rubs / gallops. No lower extremity edema.  No JVD.  Abdomen: no tenderness, Bowel sounds positive.    Neurologic: CN 2-12 grossly intact. Sensation intact,Strength 5/5 x all 4 extremities. Ambulates independently Psychiatric: Alert and oriented x 3 although does not have appropriate insight or capacity to manage care independently   Assessment/Plan: Recurrent dental abscesses status post full dental extraction on 8/26 Recurrent.  Initial episode in July that responded to IV antibiotics.  Dental caries and periapical abscess confirmed orthopantogram Had recurrence of her this weekend on 8/21 with again imaging confirming periapical periodontitis with abscesses involving the right upper molar and all of the nonincisor lower teeth Has been treated with IV Unasyn and transition to Augmentin with ASD scheduled for today 8/30 Status post full dental extraction on 8/26.  Anticipate patient may have variable oral intake so have initiated IV fluids at 50 cc/h, stopped usual Lasix, transitioned pain medications to morphine elixir with IV morphine for breakthrough pain Dental surgeon recommendation: salt water rinses every 2 hours while awake, okay to advance diet as tolerated, and patient is to follow-up with dental medicine for evaluation of healing and suture removal once discharged.  Hyponatremia with seizures: Intravascular dehydration, resolved At time of admission diuretics torsemide & metolazone were held and subsequently  discontinued.   As of 8/9 Na+ down to 127 and further decreased to 126 initially went up to 28 but as of 8/12 after acute treatment for asymptomatic volume overload Weight had increased to 210.1 lbs and in positive balance I/O therefore  associated decreased sodium suspected volume overload-below regarding treatment regimen 8/13 after diuresis sodium has decreased to 122 -Lasix briefly held 8/23 Lasix resumed but back on hold in anticipation of upcoming surgery 8/30 Eating well-Na 129 -Lasix resumed-check electrolyte panel on 9/1 9/2 sodium down to 126-weight up slightly and will continue to monitor-as a precaution will decrease Lasix back to 40 mg-if sodium continues to decrease may need 1-2 extra doses of Lasix since could be secondary to hemodilution/pneumo overload Plan to repeat electrolyte panel on 9/4  Chronic congestive heart failure with reduced ejection fraction, 25%.-Associated right heart failure.   Recently treated for 2 days with twice daily Lasix dosing in context of asymptomatic heart failure exacerbation/weight gain. Na+ subsequently decreased to 126 and weight decreased from 210.1 lbs to 206.8 lbs-Lasix decreased to daily but baseline dosage increased from 40 mg to 60 mg -see above regarding sodium and Lasix adjustments Echo May 2021-EF 25-30% Continue Coreg, Entresto, Lasix along with Daily weights with strict I/O--unable to uptitrate Entresto due to lower range BP readings Counseled extensively by multiple providers regarding need to adhere to salt restricted diet  Cardiology/Dr. Sharyn Lull recommended cardiac follow-up after discharge and likely will need follow-up echo-if EF remains 30% or less would need to be referred to EP clinic for consideration for ICD implantation  Depression, Suicidal ideations: Resolved, seen by psychiatry.  Recommended to continue BuSpar, Neurontin, Invega.   Follow-up outpatient psychiatry.   Received his routine Invega shot 8/8, takes this monthly-next dose due 9/8 Increasing anxiety over the past 24 hours which is a typical trend he has been demonstrating within the 1 week  prior to repeat dosing of Invega. Continue as needed Klonopin but will increase dose to 0.5 mg  Seizure  disorder: Stable Secondary to hyponatremia and issues of nonadherence with medications prior to admission.   Dilantin level subtherapeutic on 7/18 at 2.6 - Dilantin dosage increased to 200 mg twice daily-repeat level on 7/25 also subtherapeutic to therefore dosage increased at recommendation of pharmacy to 200 in the morning and 300 at bedtime Repeat Dilantin level 7/30 has increased but still remains low at 6.4-discussed with pharmacist and have increased a.m. dose from 200 to 300 mg; repeat Dilantin level 8.7 on 8/4 -8/11 Dilantin level therapeutic at 11.7 -continue current dose 8/27 follow-up Dilantin level 12.9-unless showed signs of toxicity no indication to repeat Dilantin level for at least 6 weeks  Microcytic anemia Iron studies relatively normal, hemoglobin stable If has significant bleeding postoperatively may need to repeat CBC for several days.  OSA Continue nocturnal CPAP-since mask changed compliance has improved Due to oral pain after recent dental extraction patient has not been consistently wearing the CPAP due to complaints of pain  COPD: stable. As needed bronchodilators  Hyperlipidemia: Daily Lipitor 40 mg  Hypokalemia/hypomagnesemia Replace as needed  Prolonged QTC:  Appears to have resolved.   Can you Seroquel 200 mg daily.  Peripheral neuropathy Change gabapentin 300 mg QID to Lyrica 50 mg TID Repeat B12 level  Modest obesity Estimated body mass index is 32.23 kg/m as calculated from the following:   Height as of this encounter:  (1.727 m).   Weight as of this encounter: 96.2 kg.      Data Reviewed: Basic Metabolic Panel: Recent Labs  Lab 04/30/20 0400 05/01/20 0152 05/02/20 0117 05/03/20 0117 05/05/20 0658  NA 124* 130* 132* 129* 126*  K 3.9 3.9 4.2 4.2 4.0  CL 95* 97* 97* 95* 94*  CO2 GLUCOSE 107* 101* 92 105* 84  BUN <5* <5* CREATININE 0.41* 0.49* 0.49* 0.62 0.49*  CALCIUM 8.7* 9.0 9.0 8.9 8.9   MG 1.6* 1.6* 1.7  --   --   PHOS 4.2 4.1  --   --   --    Liver Function Tests: Recent Labs  Lab 05/01/20 0152  AST 28  ALT 27  ALKPHOS 168*  BILITOT 0.6  PROT 6.9  ALBUMIN 3.7   No results for input(s): LIPASE, AMYLASE in the last 168 hours. No results for input(s): AMMONIA in the last 168 hours. CBC: Recent Labs  Lab 04/30/20 0400 05/01/20 0152  WBC 10.8* 8.1  NEUTROABS 6.2  --   HGB 9.1* 9.8*  HCT 28.6* 30.9*  MCV 68.8* 69.1*  PLT 163 180   Cardiac Enzymes: No results for input(s): CKTOTAL, CKMB, CKMBINDEX, TROPONINI in the last 168 hours. BNP (last 3 results) Recent Labs    01/22/20 0410 02/24/20 0030  BNP 697.9* 184.7*    ProBNP (last 3 results) No results for input(s): PROBNP in the last 8760 hours.  CBG: No results for input(s): GLUCAP in the last 168 hours.  Recent Results (from the past 240 hour(s))  SARS Coronavirus 2 by RT PCR (hospital order, performed in [facility] hospital lab) Nasopharyngeal Nasopharyngeal Swab     Status: None   Collection Time: 04/28/20 12:38 PM   Specimen: Nasopharyngeal Swab  Result Value Ref Range Status   SARS Coronavirus 2 NEGATIVE NEGATIVE Final    Comment: (NOTE) SARS-CoV-2 target nucleic acids are NOT DETECTED.  The SARS-CoV-2 RNA is  generally detectable in upper and lower respiratory specimens during the acute phase of infection. The lowest concentration of SARS-CoV-2 viral copies this assay can detect is 250 copies / mL. A negative result does not preclude SARS-CoV-2 infection and should not be used as the sole basis for treatment or other patient management decisions.  A negative result may occur with improper specimen collection / handling, submission of specimen other than nasopharyngeal swab, presence of viral mutation(s) within the areas targeted by this assay, and inadequate number of viral copies (<250 copies / mL). A negative result must be combined with clinical observations, patient history, and  epidemiological information.  Fact Sheet for Patients:   BoilerBrush.com.cy  Fact Sheet for Healthcare Providers: https://pope.com/  This test is not yet approved or  cleared by the [place] FDA and has been authorized for detection and/or diagnosis of SARS-CoV-2 by FDA under an Emergency Use Authorization (EUA).  This EUA will remain in effect (meaning this test can be used) for the duration of the COVID-19 declaration under Section 564(b)(1) of the Act, 21 U.S.C. section 360bbb-3(b)(1), unless the authorization is terminated or revoked sooner.  Performed at [facility], [address] [place], [place] [address]   Surgical pcr screen     Status: None   Collection Time: 04/28/20  3:37 PM   Specimen: Nasal Mucosa; Nasal Swab  Result Value Ref Range Status   MRSA, PCR NEGATIVE NEGATIVE Final   Staphylococcus aureus NEGATIVE NEGATIVE Final    Comment: (NOTE) The Xpert SA Assay (FDA approved for NASAL specimens in patients 43 years of age and older), is one component of a comprehensive surveillance program. It is not intended to diagnose infection nor to guide or monitor treatment. Performed at [facility], [address], [place], [place] [address]      Studies: No results found.  Scheduled Meds: . atorvastatin  40 mg Oral Daily  . busPIRone  10 mg Oral TID  . carvedilol  3.125 mg Oral BID WC  . chlorhexidine  15 mL Mouth Rinse BID  . digoxin  0.125 mg Oral Daily  . fluticasone furoate-vilanterol  1 puff Inhalation Daily  . furosemide  40 mg Oral Daily  . magnesium oxide  400 mg Oral Daily  . mouth rinse  15 mL Mouth Rinse q12n4p  . phenytoin  300 mg Oral BID  . pregabalin  75 mg Oral TID  . QUEtiapine  200 mg Oral Daily  . sacubitril-valsartan  1 tablet Oral BID   Continuous Infusions: . sodium chloride irrigation      Principal Problem:   Hyponatremia Active Problems:   Chronic systolic CHF  (congestive heart failure) (HCC)   Mixed hyperlipidemia   COPD (chronic obstructive pulmonary disease) (HCC)   Seizure disorder (HCC)   Paranoid schizophrenia (HCC)   Hypokalemia   Hypomagnesemia   Suicidal ideation   Noncompliance with medications   Subtherapeutic serum dilantin level   Seizure (HCC)        Consultants:  Dentist/oral surgeon  Procedures:  None  Antibiotics: Anti-infectives (From admission, onward)   Start     Dose/Rate Route Frequency Ordered Stop   05/02/20 1100  amoxicillin-clavulanate (AUGMENTIN) 875-125 MG per tablet 1 tablet  Status:  Discontinued        1 tablet Oral Every 12 hours 05/02/20 1048 05/02/20 1048   05/02/20 1100  amoxicillin-clavulanate (AUGMENTIN) 875-125 MG per tablet 1 tablet        1 tablet Oral Every  12 hours 05/02/20 1048 05/03/20 2143   04/29/20 0800  ceFAZolin (ANCEF) IVPB 2g/100 mL premix        2 g 200 mL/hr over 30 Minutes Intravenous On call to O.R. 04/27/20 1814 04/29/20 0918   04/24/20 2100  Ampicillin-Sulbactam (UNASYN) 3 g in sodium chloride 0.9 % 100 mL IVPB  Status:  Discontinued        3 g 200 mL/hr over 30 Minutes Intravenous Every 6 hours 04/24/20 1936 05/02/20 1048   04/02/20 2200  amoxicillin-clavulanate (AUGMENTIN) 875-125 MG per tablet 1 tablet        1 tablet Oral Every 12 hours 04/02/20 1405 04/04/20 2130   03/29/20 0930  Ampicillin-Sulbactam (UNASYN) 3 g in sodium chloride 0.9 % 100 mL IVPB  Status:  Discontinued        3 g 200 mL/hr over 30 Minutes Intravenous Every 6 hours 03/29/20 0915 04/02/20 1405       Time spent: 20 minutes    Junious Silk ANP  [facility] Pager [phone]. If 7PM-7AM, please contact night-coverage at [email or web address] 05/06/2020, 9:58 AM  LOS: 73 days

## 2020-05-07 MED ORDER — GABAPENTIN 600 MG PO TABS
300.0000 mg | ORAL_TABLET | Freq: Once | ORAL | Status: AC
  Administered 2020-05-07: 300 mg via ORAL
  Filled 2020-05-07: qty 1

## 2020-05-07 NOTE — Progress Notes (Addendum)
[facility] PROGRESS NOTE  Jim Dunn Jim Dunn:[id number] DOB: 06-10-1966 DOA: 02/23/2020 PCP: System, Pcp Not In  Status: Inpatient---Remains inpatient appropriate because:Altered mental status and Unsafe d/c plan   Dispo: The patient is from: Home              Anticipated d/c is to: Group home              Anticipated d/c date is: > 3 days              Patient currently is not medically stable to d/c. **NEEDS SAFE DC PLAN** Currently awaiting placement, TOC team working on this.  Mother is currently working to obtain Medicaid/guardianship. Unsafe for discharge based on underlying schizophrenia and history of recurrent noncompliance which led to patient's current hospitalization  Discussed with LCSW/case management. Mother presented to hospital on 8/17 to work with patient regarding Medicaid application.  She also obtained a completed FL2 form which was requested by [facility].  **As of 8/26 TOC has received information from group home outside of [facility] that they are coming the week of 8/30 to review patient's chart and evaluate him for appropriateness for their facility.  Code Status: Full Family Communication: 8/26 updated [name] bedside [ [phone] or [phone]] DVT prophylaxis: 8/5 refused Lovenox therefore switched to SCDs since ambulating without difficulty Vaccination status: Vaccinated   HPI (time of admission): 54 year old male with past medical history of schizophrenia, seizure disorder, biventricular heart failure(Echo 01/2020 EF 25-30%with right-sided heart failure),severe tricuspid regurgitation, nicotine dependence and COPD who presents to [facility] emergency department with suicidal ideation and seizure.Patient explains that since he left the hospital several days ago(admitted6/17-6/19-was being managed for hyponatremia, thought to be multifactorial in origin. Patient left AMA on6/19 and that time had sodium of 132),he has  been experiencing increasing fatigue. This increasing fatigue is associated with bilateral lower extremity tingling. Symptomsweresimilar to what prompted his presentation on 6/17. Additionally, he reported feeling depressed withsuicidal ideation, withouta plan. Patient reportedthat he hadattempted suicide in the distant past using pills but was unsuccessful and never sought medical attention. While the patient was waiting in the waiting room, he experienced a witnessed seizure while attempting to walk to the bathroom. Patient was immediately brought back to the emergency department for further evaluation. Patient was found to have substantial recurrent hyponatremia of 117 with concurrent hypokalemia of 2.6 and hypomagnesium of 1.4. Patient was administered 1236mg  of intravenous Fosphenytoinand initiated on low rate normal saline infusion. Patient was administered 2 g of intravenous magnesium and a 30 mEq of potassium.  Patient's electrolytes have improved, no further seizure episodes at this time.  Echo showed EF of 25-30%, cardiology recommended outpatient follow-up but otherwise continue Coreg, torsemide and Entresto.    Subjective: When asked about utilization of CPAP last night patient states that he forgot again.  He has requested that RT come to the room nightly to offer him CPAP again like previously.  Objective: Vitals:   05/07/20 0913 05/07/20 0916  BP: 103/75   Pulse: 83 83  Resp:    Temp:    SpO2:     No intake or output data in the 24 hours ending 05/07/20 1055 Filed Weights   04/29/20 0821 05/02/20 0407 05/02/20 2350  Weight: 95.1 kg 96.3 kg 96.2 kg    Exam: Constitutional: NAD, mildly anxious but reports is significantly improved as compared to overnight, comfortable Oral: No bleeding from mouth.  Swelling has nearly resolved and speech more clear  as edema has decreased.  Tolerating diet. Respiratory: clear to auscultation bilaterally on posterior exam, Normal  respiratory effort at rest.  Room air Cardiovascular: Regular rate, no murmurs / rubs / gallops. No lower extremity edema.  No JVD.  Abdomen: no tenderness, Bowel sounds positive.    Neurologic: CN 2-12 grossly intact. Sensation intact,Strength 5/5 x all 4 extremities. Ambulates independently Psychiatric: Alert and oriented x 3 although does not have appropriate insight or capacity to manage care independently   Assessment/Plan: Recurrent dental abscesses status post full dental extraction on 8/26 Recurrent.  Initial episode in July that responded to IV antibiotics recurrent episode in August.  Both with confirmed dental abscess orthopantogram in both episodes responsive to IV Unasyn. Status post full dental extraction on 8/26.  Dental surgeon recommends   follow-up with dental medicine for evaluation of healing and suture removal once discharged.  Hyponatremia with seizures: Intravascular dehydration, resolved At time of admission diuretics torsemide & metolazone were held and subsequently  discontinued.   Weight had gone up and suspected evolving heart failure therefore Lasix was increased to BID dosing.  8/13 after diuresis sodium has decreased to 122 -Lasix briefly held 8/23 Lasix resumed but was held briefly for oral surgery due to concerns of a possible concomitant decreased oral intake and dehydration 8/30 Eating well-Na 129 -Lasix resumed 9/2 sodium down to 126-weight up slightly but does not appear consistent with heart failure therefore have decreased Lasix back to 40 mg daily Plan to repeat electrolyte panel on 9/4  Chronic congestive heart failure with reduced ejection fraction, 25%.-Associated right heart failure.   Lasix decreased to daily but baseline dosage increased from 40 mg to 60 mg given recent treatment for suspected evolving CHF exacerbation-see above regarding sodium and Lasix adjustments-current dose down to 40 mg daily as above Echo May 2021-EF 25-30% Continue  Coreg, Entresto, Lasix along with Daily weights with strict I/O- Unable to [employer] due to lower range BP readings Counseled extensively by multiple providers regarding need to adhere to salt restricted diet  Cardiology/Dr. Sharyn Lull recommended cardiac follow-up after discharge and likely will need follow-up echo-if EF remains 30% or less would need to be referred to EP clinic for consideration for ICD implantation  Depression, Suicidal ideations: Resolved, seen by psychiatry.  Recommended to continue BuSpar, Neurontin, Invega.   Follow-up outpatient psychiatry.   Received his routine Invega shot 8/8, takes this monthly-next dose due 9/8 Increasing anxiety over the past 24 hours which is a typical trend he has been demonstrating within the 1 week prior to repeat dosing of Invega. Continue prn Klonopin 0.5 mg  Seizure disorder: Stable Secondary to hyponatremia and issues of nonadherence with medications prior to admission.   Dilantin level subtherapeutic on 7/18 at 2.6 with dosage adjustments made several times during the admission based on Dilantin level 8/11 Dilantin level therapeutic at 11.7 -continue current dose 8/27 follow-up Dilantin level 12.9-unless showed signs of toxicity no indication to repeat Dilantin level for at least 6 weeks  Microcytic anemia Iron studies relatively normal, hemoglobin stable If has significant bleeding postoperatively may need to repeat CBC for several days.  OSA Continue nocturnal CPAP-since mask changed compliance has improved Due to oral pain after recent dental extraction patient has not been consistently wearing the CPAP due to complaints of pain  COPD: stable. As needed bronchodilators  Hyperlipidemia: Daily Lipitor 40 mg  Hypokalemia/hypomagnesemia Replace as needed  Prolonged QTC:  Appears to have resolved.   Can you Seroquel 200 mg daily.  Peripheral neuropathy Change gabapentin 300 mg QID to Lyrica 50 mg  TID Repeat B12 level  Modest obesity Estimated body mass index is 32.23 kg/m as calculated from the following:   Height as of this encounter: 5\' 8"  (1.727 m).   Weight as of this encounter: 96.2 kg.      Data Reviewed: Basic Metabolic Panel: Recent Labs  Lab 05/01/20 0152 05/02/20 0117 05/03/20 0117 05/05/20 0658  NA 130* 132* 129* 126*  K 3.9 4.2 4.2 4.0  CL 97* 97* 95* 94*  CO2 24 25 25 23   GLUCOSE 101* 92 105* 84  BUN <5* 8 6 6   CREATININE 0.49* 0.49* 0.62 0.49*  CALCIUM 9.0 9.0 8.9 8.9  MG 1.6* 1.7  --   --   PHOS 4.1  --   --   --    Liver Function Tests: Recent Labs  Lab 05/01/20 0152  AST 28  ALT 27  ALKPHOS 168*  BILITOT 0.6  PROT 6.9  ALBUMIN 3.7   No results for input(s): LIPASE, AMYLASE in the last 168 hours. No results for input(s): AMMONIA in the last 168 hours. CBC: Recent Labs  Lab 05/01/20 0152  WBC 8.1  HGB 9.8*  HCT 30.9*  MCV 69.1*  PLT 180   Cardiac Enzymes: No results for input(s): CKTOTAL, CKMB, CKMBINDEX, TROPONINI in the last 168 hours. BNP (last 3 results) Recent Labs    01/22/20 0410 02/24/20 0030  BNP 697.9* 184.7*    ProBNP (last 3 results) No results for input(s): PROBNP in the last 8760 hours.  CBG: No results for input(s): GLUCAP in the last 168 hours.  Recent Results (from the past 240 hour(s))  SARS Coronavirus 2 by RT PCR (hospital order, performed in [facility] hospital lab) Nasopharyngeal Nasopharyngeal Swab     Status: None   Collection Time: 04/28/20 12:38 PM   Specimen: Nasopharyngeal Swab  Result Value Ref Range Status   SARS Coronavirus 2 NEGATIVE NEGATIVE Final    Comment: (NOTE) SARS-CoV-2 target nucleic acids are NOT DETECTED.  The SARS-CoV-2 RNA is generally detectable in upper and lower respiratory specimens during the acute phase of infection. The lowest concentration of SARS-CoV-2 viral copies this assay can detect is 250 copies / mL. A negative result does not preclude SARS-CoV-2  infection and should not be used as the sole basis for treatment or other patient management decisions.  A negative result may occur with improper specimen collection / handling, submission of specimen other than nasopharyngeal swab, presence of viral mutation(s) within the areas targeted by this assay, and inadequate number of viral copies (<250 copies / mL). A negative result must be combined with clinical observations, patient history, and epidemiological information.  Fact Sheet for Patients:   BoilerBrush.com.cy  Fact Sheet for Healthcare Providers: https://pope.com/  This test is not yet approved or  cleared by the [place] FDA and has been authorized for detection and/or diagnosis of SARS-CoV-2 by FDA under an Emergency Use Authorization (EUA).  This EUA will remain in effect (meaning this test can be used) for the duration of the COVID-19 declaration under Section 564(b)(1) of the Act, 21 U.S.C. section 360bbb-3(b)(1), unless the authorization is terminated or revoked sooner.  Performed at [facility], [address] [place], [place] [address]   Surgical pcr screen     Status: None   Collection Time: 04/28/20  3:37 PM   Specimen: Nasal Mucosa; Nasal Swab  Result Value Ref Range Status   MRSA, PCR  NEGATIVE NEGATIVE Final   Staphylococcus aureus NEGATIVE NEGATIVE Final    Comment: (NOTE) The Xpert SA Assay (FDA approved for NASAL specimens in patients 53 years of age and older), is one component of a comprehensive surveillance program. It is not intended to diagnose infection nor to guide or monitor treatment. Performed at [facility], [address] [place], [place] [address]      Studies: No results found.  Scheduled Meds:  atorvastatin  40 mg Oral Daily   busPIRone  10 mg Oral TID   carvedilol  3.125 mg Oral BID WC   chlorhexidine  15 mL Mouth Rinse BID   digoxin  0.125 mg Oral Daily    fluticasone furoate-vilanterol  1 puff Inhalation Daily   furosemide  40 mg Oral Daily   magnesium oxide  400 mg Oral Daily   mouth rinse  15 mL Mouth Rinse q12n4p   phenytoin  300 mg Oral BID   pregabalin  75 mg Oral TID   QUEtiapine  200 mg Oral Daily   sacubitril-valsartan  1 tablet Oral BID   Continuous Infusions:  sodium chloride irrigation      Principal Problem:   Hyponatremia Active Problems:   Chronic systolic CHF (congestive heart failure) (HCC)   Mixed hyperlipidemia   COPD (chronic obstructive pulmonary disease) (HCC)   Seizure disorder (HCC)   Paranoid schizophrenia (HCC)   Hypokalemia   Hypomagnesemia   Suicidal ideation   Noncompliance with medications   Subtherapeutic serum dilantin level   Seizure (HCC)        Consultants:  Dentist/oral surgeon  Procedures:  None  Antibiotics: Anti-infectives (From admission, onward)   Start     Dose/Rate Route Frequency Ordered Stop   05/02/20 1100  amoxicillin-clavulanate (AUGMENTIN) 875-125 MG per tablet 1 tablet  Status:  Discontinued        1 tablet Oral Every 12 hours 05/02/20 1048 05/02/20 1048   05/02/20 1100  amoxicillin-clavulanate (AUGMENTIN) 875-125 MG per tablet 1 tablet        1 tablet Oral Every 12 hours 05/02/20 1048 05/03/20 2143   04/29/20 0800  ceFAZolin (ANCEF) IVPB 2g/100 mL premix        2 g 200 mL/hr over 30 Minutes Intravenous On call to O.R. 04/27/20 1814 04/29/20 0918   04/24/20 2100  Ampicillin-Sulbactam (UNASYN) 3 g in sodium chloride 0.9 % 100 mL IVPB  Status:  Discontinued        3 g 200 mL/hr over 30 Minutes Intravenous Every 6 hours 04/24/20 1936 05/02/20 1048   04/02/20 2200  amoxicillin-clavulanate (AUGMENTIN) 875-125 MG per tablet 1 tablet        1 tablet Oral Every 12 hours 04/02/20 1405 04/04/20 2130   03/29/20 0930  Ampicillin-Sulbactam (UNASYN) 3 g in sodium chloride 0.9 % 100 mL IVPB  Status:  Discontinued        3 g 200 mL/hr over 30 Minutes Intravenous  Every 6 hours 03/29/20 0915 04/02/20 1405       Time spent: 20 minutes    Junious Silk ANP  [facility] Pager [phone]. If 7PM-7AM, please contact night-coverage at [email or web address] 05/07/2020, 10:55 AM  LOS: 74 days

## 2020-05-07 NOTE — Progress Notes (Signed)
   05/07/20 1530  Clinical Encounter Type  Visited With Patient  Visit Type Initial  Referral From Physician  Consult/Referral To Chaplain  Spiritual Encounters  Spiritual Needs Prayer;Ritual  Stress Factors  Patient Stress Factors Other (Comment) (Pt expressed concern for the state of his mind.)   Chaplain prayed with Pt. as requested. Chaplain confirmed with Pt. that he would like a blessing with oil. Chaplain did not have oil at that moment and told Pt. they would come back at a later time.   This note was prepared by Chaplain Resident, Tacy Learn, MDiv. For questions, please contact by phone at [phone].

## 2020-05-08 LAB — BASIC METABOLIC PANEL
Anion gap: 7 (ref 5–15)
BUN: 5 mg/dL — ABNORMAL LOW (ref 6–20)
CO2: 25 mmol/L (ref 22–32)
Calcium: 8.9 mg/dL (ref 8.9–10.3)
Chloride: 93 mmol/L — ABNORMAL LOW (ref 98–111)
Creatinine, Ser: 0.48 mg/dL — ABNORMAL LOW (ref 0.61–1.24)
GFR calc Af Amer: >60 mL/min (ref >60)
GFR calc non Af Amer: >60 mL/min (ref >60)
Glucose, Bld: 101 mg/dL — ABNORMAL HIGH (ref 70–99)
Potassium: 4.2 mmol/L (ref 3.5–5.1)
Sodium: 125 mmol/L — ABNORMAL LOW (ref 135–145)

## 2020-05-08 LAB — BRAIN NATRIURETIC PEPTIDE: B Natriuretic Peptide: 48.2 pg/mL (ref 0.0–100.0)

## 2020-05-08 NOTE — Progress Notes (Signed)
PROGRESS NOTE    Jim Dunn  YBO:[id number] DOB: Jan 21, 1966 DOA: 02/23/2020 PCP: System, Pcp Not In   Brief Narrative:   54 year old male with past medical history of schizophrenia, seizure disorder, biventricular heart failure(Echo 01/2020 EF 25-30%with right-sided heart failure),severe tricuspid regurgitation, nicotine dependence and COPD who presents to [facility] emergency department with suicidal ideation and seizure.Patient explains that since he left the hospital several days ago(admitted6/17-6/19-was being managed for hyponatremia, thought to be multifactorial in origin. Patient left AMA on6/19 and that time had sodium of 132),he has been experiencing increasing fatigue. This increasing fatigue is associated with bilateral lower extremity tingling. Symptomsweresimilar to what prompted his presentation on 6/17. Additionally, he reported feeling depressed withsuicidal ideation, withouta plan. Patient reportedthat he hadattempted suicide in the distant past using pills but was unsuccessful and never sought medical attention. While the patient was waiting in the waiting room, he experienced a witnessed seizure while attempting to walk to the bathroom. Patient was immediately brought back to the emergency department for further evaluation. Patient was found to have substantial recurrent hyponatremia of 117 with concurrent hypokalemia of 2.6 and hypomagnesium of 1.4. Patient was administered 1236mg  of intravenous Fosphenytoinand initiated on low rate normal saline infusion. Patient was administered 2 g of intravenous magnesium and a 30 mEq of potassium.Patient's electrolytes have improved, no further seizure episodes at this time. Echo showed EF of 25-30%, cardiology recommended outpatient follow-up but otherwise continue Coreg, torsemide and Entresto.  Currently awaiting placement.   Assessment & Plan:   Principal Problem:   Hyponatremia Active Problems:    Chronic systolic CHF (congestive heart failure) (HCC)   Mixed hyperlipidemia   COPD (chronic obstructive pulmonary disease) (HCC)   Seizure disorder (HCC)   Paranoid schizophrenia (HCC)   Hypokalemia   Hypomagnesemia   Suicidal ideation   Noncompliance with medications   Subtherapeutic serum dilantin level   Seizure (HCC)   Recurrent dental infections status post full dental extraction 8/26-supportive care and follow-up outpatient.  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  Closely monitor sodium levels  Congestive heart failure reduced ejection fraction, EF 25%-still somewhat intravascular volume depletion therefore holding on diuretics.  Closely monitor and resume when appropriate  History of depression with suicidal ideation-would continue current home medications  History of seizure disorder-continue current medication  Obstructive sleep apnea-CPAP  COPD-stable  Peripheral neuropathy-on Lyrica 50 units 3 times daily  DVT prophylaxis: Patient is ambulatory Code Status: Full code Family Communication: Occasionally mother has been updated  Status is: Inpatient \ Currently patient is medically stable awaiting placement.  TOC team has been working on this.    Body mass index is 33.15 kg/m.    Subjective: No complaints this morning, sitting up in the chair.  Has been ambulating in the hallway.  Review of Systems Otherwise negative except as per HPI, including: General: Denies fever, chills, night sweats or unintended weight loss. Resp: Denies cough, wheezing, shortness of breath. Cardiac: Denies chest pain, palpitations, orthopnea, paroxysmal nocturnal dyspnea. GI: Denies abdominal pain, nausea, vomiting, diarrhea or constipation GU: Denies dysuria, frequency, hesitancy or incontinence MS: Denies muscle aches, joint pain or swelling Neuro: Denies headache, neurologic deficits (focal weakness, numbness,  tingling), abnormal gait Psych: Denies anxiety, depression, SI/HI/AVH Skin: Denies new rashes or lesions ID: Denies sick contacts, exotic exposures, travel  Examination:  General exam: Appears calm and comfortable  Respiratory system: Clear to auscultation. Respiratory effort normal. Cardiovascular system: S1 & S2 heard, RRR. No JVD, murmurs,  rubs, gallops or clicks. No pedal edema. Gastrointestinal system: Abdomen is nondistended, soft and nontender. No organomegaly or masses felt. Normal bowel sounds heard. Central nervous system: Alert and oriented. No focal neurological deficits. Extremities: Symmetric 5 x 5 power. Skin: No rashes, lesions or ulcers Psychiatry: Judgement and insight appear poor due to intellectual disability.. Mood & affect appropriate.     Objective: Vitals:   05/08/20 0356 05/08/20 0415 05/08/20 0823 05/08/20 0837  BP: 105/74  132/72   Pulse: 83  75   Resp: 18  20   Temp: (!) 97.5 F (36.4 C)  (!) 97.5 F (36.4 C)   TempSrc: Oral  Oral   SpO2: 100%  97% 98%  Weight:  98.9 kg    Height:       No intake or output data in the 24 hours ending 05/08/20 0907 Filed Weights   05/02/20 0407 05/02/20 2350 05/08/20 0415  Weight: 96.3 kg 96.2 kg 98.9 kg     Data Reviewed:   CBC: No results for input(s): WBC, NEUTROABS, HGB, HCT, MCV, PLT in the last 168 hours. Basic Metabolic Panel: Recent Labs  Lab 05/02/20 0117 05/03/20 0117 05/05/20 0658 05/08/20 0252  NA 132* 129* 126* 125*  K 4.2 4.2 4.0 4.2  CL 97* 95* 94* 93*  CO2 25 25 23 25   GLUCOSE 92 105* 84 101*  BUN 8 6 6  5*  CREATININE 0.49* 0.62 0.49* 0.48*  CALCIUM 9.0 8.9 8.9 8.9  MG 1.7  --   --   --    GFR: Estimated Creatinine Clearance: 123.1 mL/min (A) (by C-G formula based on SCr of 0.48 mg/dL (L)). Liver Function Tests: No results for input(s): AST, ALT, ALKPHOS, BILITOT, PROT, ALBUMIN in the last 168 hours. No results for input(s): LIPASE, AMYLASE in the last 168 hours. No results  for input(s): AMMONIA in the last 168 hours. Coagulation Profile: No results for input(s): INR, PROTIME in the last 168 hours. Cardiac Enzymes: No results for input(s): CKTOTAL, CKMB, CKMBINDEX, TROPONINI in the last 168 hours. BNP (last 3 results) No results for input(s): PROBNP in the last 8760 hours. HbA1C: No results for input(s): HGBA1C in the last 72 hours. CBG: No results for input(s): GLUCAP in the last 168 hours. Lipid Profile: No results for input(s): CHOL, HDL, LDLCALC, TRIG, CHOLHDL, LDLDIRECT in the last 72 hours. Thyroid Function Tests: No results for input(s): TSH, T4TOTAL, FREET4, T3FREE, THYROIDAB in the last 72 hours. Anemia Panel: No results for input(s): VITAMINB12, FOLATE, FERRITIN, TIBC, IRON, RETICCTPCT in the last 72 hours. Sepsis Labs: No results for input(s): PROCALCITON, LATICACIDVEN in the last 168 hours.  Recent Results (from the past 240 hour(s))  SARS Coronavirus 2 by RT PCR (hospital order, performed in [facility] hospital lab) Nasopharyngeal Nasopharyngeal Swab     Status: None   Collection Time: 04/28/20 12:38 PM   Specimen: Nasopharyngeal Swab  Result Value Ref Range Status   SARS Coronavirus 2 NEGATIVE NEGATIVE Final    Comment: (NOTE) SARS-CoV-2 target nucleic acids are NOT DETECTED.  The SARS-CoV-2 RNA is generally detectable in upper and lower respiratory specimens during the acute phase of infection. The lowest concentration of SARS-CoV-2 viral copies this assay can detect is 250 copies / mL. A negative result does not preclude SARS-CoV-2 infection and should not be used as the sole basis for treatment or other patient management decisions.  A negative result may occur with improper specimen collection / handling, submission of specimen other than nasopharyngeal swab, presence  of viral mutation(s) within the areas targeted by this assay, and inadequate number of viral copies (<250 copies / mL). A negative result must be combined with  clinical observations, patient history, and epidemiological information.  Fact Sheet for Patients:   BoilerBrush.com.cy  Fact Sheet for Healthcare Providers: https://pope.com/  This test is not yet approved or  cleared by the [place] FDA and has been authorized for detection and/or diagnosis of SARS-CoV-2 by FDA under an Emergency Use Authorization (EUA).  This EUA will remain in effect (meaning this test can be used) for the duration of the COVID-19 declaration under Section 564(b)(1) of the Act, 21 U.S.C. section 360bbb-3(b)(1), unless the authorization is terminated or revoked sooner.  Performed at [facility], [address] [place], [place] [address]   Surgical pcr screen     Status: None   Collection Time: 04/28/20  3:37 PM   Specimen: Nasal Mucosa; Nasal Swab  Result Value Ref Range Status   MRSA, PCR NEGATIVE NEGATIVE Final   Staphylococcus aureus NEGATIVE NEGATIVE Final    Comment: (NOTE) The Xpert SA Assay (FDA approved for NASAL specimens in patients 33 years of age and older), is one component of a comprehensive surveillance program. It is not intended to diagnose infection nor to guide or monitor treatment. Performed at [facility], [address], [place], [place] [address]          Radiology Studies: No results found.      Scheduled Meds: . atorvastatin  40 mg Oral Daily  . busPIRone  10 mg Oral TID  . carvedilol  3.125 mg Oral BID WC  . chlorhexidine  15 mL Mouth Rinse BID  . digoxin  0.125 mg Oral Daily  . fluticasone furoate-vilanterol  1 puff Inhalation Daily  . magnesium oxide  400 mg Oral Daily  . mouth rinse  15 mL Mouth Rinse q12n4p  . phenytoin  300 mg Oral BID  . pregabalin  75 mg Oral TID  . QUEtiapine  200 mg Oral Daily  . sacubitril-valsartan  1 tablet Oral BID   Continuous Infusions: . sodium chloride irrigation       LOS: 75 days   Time spent= 15  mins    Nakyia Dau Joline Maxcy, MD [facility]  If 7PM-7AM, please contact night-coverage  05/08/2020, 9:07 AM

## 2020-05-09 LAB — BASIC METABOLIC PANEL
Anion gap: 7 (ref 5–15)
BUN: 6 mg/dL (ref 6–20)
CO2: 25 mmol/L (ref 22–32)
Calcium: 9.4 mg/dL (ref 8.9–10.3)
Chloride: 97 mmol/L — ABNORMAL LOW (ref 98–111)
Creatinine, Ser: 0.49 mg/dL — ABNORMAL LOW (ref 0.61–1.24)
GFR calc Af Amer: >60 mL/min (ref >60)
GFR calc non Af Amer: >60 mL/min (ref >60)
Glucose, Bld: 88 mg/dL (ref 70–99)
Potassium: 4.2 mmol/L (ref 3.5–5.1)
Sodium: 129 mmol/L — ABNORMAL LOW (ref 135–145)

## 2020-05-09 LAB — CBC
HCT: 32.4 % — ABNORMAL LOW (ref 39.0–52.0)
Hemoglobin: 10.2 g/dL — ABNORMAL LOW (ref 13.0–17.0)
MCH: 22.1 pg — ABNORMAL LOW (ref 26.0–34.0)
MCHC: 31.5 g/dL (ref 30.0–36.0)
MCV: 70.1 fL — ABNORMAL LOW (ref 80.0–100.0)
Platelets: 199 10*3/uL (ref 150–400)
RBC: 4.62 MIL/uL (ref 4.22–5.81)
RDW: 21 % — ABNORMAL HIGH (ref 11.5–15.5)
WBC: 7.5 10*3/uL (ref 4.0–10.5)
nRBC: 0 % (ref 0.0–0.2)

## 2020-05-09 NOTE — Progress Notes (Signed)
PROGRESS NOTE    Master Touchet  HCW:[id number] DOB: Sep 30, 1965 DOA: 02/23/2020 PCP: System, Pcp Not In   Brief Narrative:   54 year old male with past medical history of schizophrenia, seizure disorder, biventricular heart failure(Echo 01/2020 EF 25-30%with right-sided heart failure),severe tricuspid regurgitation, nicotine dependence and COPD who presents to [facility] emergency department with suicidal ideation and seizure.Patient explains that since he left the hospital several days ago(admitted6/17-6/19-was being managed for hyponatremia, thought to be multifactorial in origin. Patient left AMA on6/19 and that time had sodium of 132),he has been experiencing increasing fatigue. This increasing fatigue is associated with bilateral lower extremity tingling. Symptomsweresimilar to what prompted his presentation on 6/17. Additionally, he reported feeling depressed withsuicidal ideation, withouta plan. Patient reportedthat he hadattempted suicide in the distant past using pills but was unsuccessful and never sought medical attention. While the patient was waiting in the waiting room, he experienced a witnessed seizure while attempting to walk to the bathroom. Patient was immediately brought back to the emergency department for further evaluation. Patient was found to have substantial recurrent hyponatremia of 117 with concurrent hypokalemia of 2.6 and hypomagnesium of 1.4. Patient was administered 1236mg  of intravenous Fosphenytoinand initiated on low rate normal saline infusion. Patient was administered 2 g of intravenous magnesium and a 30 mEq of potassium.Patient's electrolytes have improved, no further seizure episodes at this time. Echo showed EF of 25-30%, cardiology recommended outpatient follow-up but otherwise continue Coreg, torsemide and Entresto.  Currently awaiting placement.   Assessment & Plan:   Principal Problem:   Hyponatremia Active Problems:    Chronic systolic CHF (congestive heart failure) (HCC)   Mixed hyperlipidemia   COPD (chronic obstructive pulmonary disease) (HCC)   Seizure disorder (HCC)   Paranoid schizophrenia (HCC)   Hypokalemia   Hypomagnesemia   Suicidal ideation   Noncompliance with medications   Subtherapeutic serum dilantin level   Seizure (HCC)   Recurrent dental infections status post full dental extraction 8/26-supportive care and follow-up outpatient.  Hyponatremia due to intravascular volume depletion-sodium levels improved. Currently his home diuretics are on hold.  Closely monitor sodium levels  Congestive heart failure reduced ejection fraction, EF 25%-still somewhat intravascular volume depletion therefore holding on diuretics.  Closely monitor and resume when appropriate  History of depression with suicidal ideation-would continue current home medications  History of seizure disorder-continue current medication  Obstructive sleep apnea-CPAP  COPD-stable  Peripheral neuropathy-on Lyrica 50 units 3 times daily  DVT prophylaxis: Patient is ambulatory Code Status: Full code Family Communication: Occasionally mother has been updated  Status is: Inpatient \ Currently patient is medically stable awaiting placement.  TOC team has been working on this.    Body mass index is 33.15 kg/m.    Subjective: Seen and examined at bedside, no complaints feels okay. Review of Systems Otherwise negative except as per HPI, including: General: Denies fever, chills, night sweats or unintended weight loss. Resp: Denies cough, wheezing, shortness of breath. Cardiac: Denies chest pain, palpitations, orthopnea, paroxysmal nocturnal dyspnea. GI: Denies abdominal pain, nausea, vomiting, diarrhea or constipation GU: Denies dysuria, frequency, hesitancy or incontinence MS: Denies muscle aches, joint pain or swelling Neuro: Denies headache, neurologic deficits (focal weakness, numbness, tingling), abnormal  gait Psych: Denies anxiety, depression, SI/HI/AVH Skin: Denies new rashes or lesions ID: Denies sick contacts, exotic exposures, travel Examination: Constitutional: Not in acute distress Respiratory: Clear to auscultation bilaterally Cardiovascular: Normal sinus rhythm, no rubs Abdomen: Nontender nondistended good bowel sounds Musculoskeletal: No edema noted Skin: No rashes seen Neurologic: CN  2-12 grossly intact.  And nonfocal Psychiatric: Normal judgment and insight. Alert and oriented x 3. Normal mood.  Objective: Vitals:   05/09/20 0430 05/09/20 0745 05/09/20 0746 05/09/20 0756  BP: 134/81 128/79 128/78   Pulse: 71 70 73   Resp: 18 18    Temp: 98 F (36.7 C) 98.9 F (37.2 C)    TempSrc: Oral Oral    SpO2: 99% 100%  98%  Weight:      Height:       No intake or output data in the 24 hours ending 05/09/20 0942 Filed Weights   05/02/20 0407 05/02/20 2350 05/08/20 0415  Weight: 96.3 kg 96.2 kg 98.9 kg     Data Reviewed:   CBC: Recent Labs  Lab 05/09/20 0354  WBC 7.5  HGB 10.2*  HCT 32.4*  MCV 70.1*  PLT 199   Basic Metabolic Panel: Recent Labs  Lab 05/03/20 0117 05/05/20 0658 05/08/20 0252 05/09/20 0354  NA 129* 126* 125* 129*  K 4.2 4.0 4.2 4.2  CL 95* 94* 93* 97*  CO2 25 23 25 25   GLUCOSE 105* 84 101* 88  BUN 6 6 5* 6  CREATININE 0.62 0.49* 0.48* 0.49*  CALCIUM 8.9 8.9 8.9 9.4   GFR: Estimated Creatinine Clearance: 123.1 mL/min (A) (by C-G formula based on SCr of 0.49 mg/dL (L)). Liver Function Tests: No results for input(s): AST, ALT, ALKPHOS, BILITOT, PROT, ALBUMIN in the last 168 hours. No results for input(s): LIPASE, AMYLASE in the last 168 hours. No results for input(s): AMMONIA in the last 168 hours. Coagulation Profile: No results for input(s): INR, PROTIME in the last 168 hours. Cardiac Enzymes: No results for input(s): CKTOTAL, CKMB, CKMBINDEX, TROPONINI in the last 168 hours. BNP (last 3 results) No results for input(s): PROBNP in  the last 8760 hours. HbA1C: No results for input(s): HGBA1C in the last 72 hours. CBG: No results for input(s): GLUCAP in the last 168 hours. Lipid Profile: No results for input(s): CHOL, HDL, LDLCALC, TRIG, CHOLHDL, LDLDIRECT in the last 72 hours. Thyroid Function Tests: No results for input(s): TSH, T4TOTAL, FREET4, T3FREE, THYROIDAB in the last 72 hours. Anemia Panel: No results for input(s): VITAMINB12, FOLATE, FERRITIN, TIBC, IRON, RETICCTPCT in the last 72 hours. Sepsis Labs: No results for input(s): PROCALCITON, LATICACIDVEN in the last 168 hours.  No results found for this or any previous visit (from the past 240 hour(s)).       Radiology Studies: No results found.      Scheduled Meds: . atorvastatin  40 mg Oral Daily  . busPIRone  10 mg Oral TID  . carvedilol  3.125 mg Oral BID WC  . chlorhexidine  15 mL Mouth Rinse BID  . digoxin  0.125 mg Oral Daily  . fluticasone furoate-vilanterol  1 puff Inhalation Daily  . magnesium oxide  400 mg Oral Daily  . mouth rinse  15 mL Mouth Rinse q12n4p  . phenytoin  300 mg Oral BID  . pregabalin  75 mg Oral TID  . QUEtiapine  200 mg Oral Daily  . sacubitril-valsartan  1 tablet Oral BID   Continuous Infusions: . sodium chloride irrigation       LOS: 76 days   Time spent= 15 mins    Haile Toppins , MD [facility]  If 7PM-7AM, please contact night-coverage  05/09/2020, 9:42 AM

## 2020-05-10 LAB — BASIC METABOLIC PANEL
Anion gap: 8 (ref 5–15)
BUN: 6 mg/dL (ref 6–20)
CO2: 24 mmol/L (ref 22–32)
Calcium: 9 mg/dL (ref 8.9–10.3)
Chloride: 93 mmol/L — ABNORMAL LOW (ref 98–111)
Creatinine, Ser: 0.63 mg/dL (ref 0.61–1.24)
GFR calc Af Amer: >60 mL/min (ref >60)
GFR calc non Af Amer: >60 mL/min (ref >60)
Glucose, Bld: 96 mg/dL (ref 70–99)
Potassium: 4.3 mmol/L (ref 3.5–5.1)
Sodium: 125 mmol/L — ABNORMAL LOW (ref 135–145)

## 2020-05-10 NOTE — Progress Notes (Signed)
PROGRESS NOTE    Jim Dunn  FYB:[id number] DOB: 08-25-1966 DOA: 02/23/2020 PCP: System, Pcp Not In   Brief Narrative:   54 year old male with past medical history of schizophrenia, seizure disorder, biventricular heart failure(Echo 01/2020 EF 25-30%with right-sided heart failure),severe tricuspid regurgitation, nicotine dependence and COPD who presents to [facility] emergency department with suicidal ideation and seizure.Patient explains that since he left the hospital several days ago(admitted6/17-6/19-was being managed for hyponatremia, thought to be multifactorial in origin. Patient left AMA on6/19 and that time had sodium of 132),he has been experiencing increasing fatigue. This increasing fatigue is associated with bilateral lower extremity tingling. Symptomsweresimilar to what prompted his presentation on 6/17. Additionally, he reported feeling depressed withsuicidal ideation, withouta plan. Patient reportedthat he hadattempted suicide in the distant past using pills but was unsuccessful and never sought medical attention. While the patient was waiting in the waiting room, he experienced a witnessed seizure while attempting to walk to the bathroom. Patient was immediately brought back to the emergency department for further evaluation. Patient was found to have substantial recurrent hyponatremia of 117 with concurrent hypokalemia of 2.6 and hypomagnesium of 1.4. Patient was administered 1236mg  of intravenous Fosphenytoinand initiated on low rate normal saline infusion. Patient was administered 2 g of intravenous magnesium and a 30 mEq of potassium.Patient's electrolytes have improved, no further seizure episodes at this time. Echo showed EF of 25-30%, cardiology recommended outpatient follow-up but otherwise continue Coreg, torsemide and Entresto.  Currently awaiting placement.   Assessment & Plan:   Principal Problem:   Hyponatremia Active Problems:    Chronic systolic CHF (congestive heart failure) (HCC)   Mixed hyperlipidemia   COPD (chronic obstructive pulmonary disease) (HCC)   Seizure disorder (HCC)   Paranoid schizophrenia (HCC)   Hypokalemia   Hypomagnesemia   Suicidal ideation   Noncompliance with medications   Subtherapeutic serum dilantin level   Seizure (HCC)   Recurrent dental infections status post full dental extraction 8/26-supportive care and follow-up outpatient.  Hyponatremia due to intravascular volume depletion-closely monitor sodium levels, and diuretics currently on hold.  Congestive heart failure reduced ejection fraction, EF 25%-still somewhat intravascular volume depletion therefore holding on diuretics.  Closely monitor and resume when appropriate  History of depression with suicidal ideation-would continue current home medications  History of seizure disorder-continue current medication  Obstructive sleep apnea-CPAP  COPD-stable  Peripheral neuropathy-on Lyrica 50 units 3 times daily  DVT prophylaxis: Patient is ambulatory Code Status: Full code Family Communication: Mother updated periodically  Status is: Inpatient \ Currently patient is medically stable awaiting placement.  TOC team has been working on this.    Body mass index is 32.86 kg/m.    Subjective: Seen and examined at bedside, listening to music on his phone does not have any complaints.   Examination: Constitutional: Not in acute distress Respiratory: Clear to auscultation bilaterally Cardiovascular: Normal sinus rhythm, no rubs Abdomen: Nontender nondistended good bowel sounds Musculoskeletal: No edema noted Skin: No rashes seen Neurologic: CN 2-12 grossly intact.  And nonfocal Psychiatric: Normal judgment and insight. Alert and oriented x 3. Normal mood.  Objective: Vitals:   05/10/20 0757 05/10/20 0824 05/10/20 0825 05/10/20 1203  BP:  126/81  126/68  Pulse:  79  98  Resp:  18  18  Temp:  97.6 F (36.4 C)   97.7 F (36.5 C)  TempSrc:  Oral  Axillary  SpO2:  100% 98% 100%  Weight: 98 kg     Height:  No intake or output data in the 24 hours ending 05/10/20 1211 Filed Weights   05/02/20 2350 05/08/20 0415 05/10/20 0757  Weight: 96.2 kg 98.9 kg 98 kg     Data Reviewed:   CBC: Recent Labs  Lab 05/09/20 0354  WBC 7.5  HGB 10.2*  HCT 32.4*  MCV 70.1*  PLT 199   Basic Metabolic Panel: Recent Labs  Lab 05/05/20 0658 05/08/20 0252 05/09/20 0354 05/10/20 0320  NA 126* 125* 129* 125*  K 4.0 4.2 4.2 4.3  CL 94* 93* 97* 93*  CO2 23 25 25 24   GLUCOSE 84 101* 88 96  BUN 6 5* 6 6  CREATININE 0.49* 0.48* 0.49* 0.63  CALCIUM 8.9 8.9 9.4 9.0   GFR: Estimated Creatinine Clearance: 122.5 mL/min (by C-G formula based on SCr of 0.63 mg/dL). Liver Function Tests: No results for input(s): AST, ALT, ALKPHOS, BILITOT, PROT, ALBUMIN in the last 168 hours. No results for input(s): LIPASE, AMYLASE in the last 168 hours. No results for input(s): AMMONIA in the last 168 hours. Coagulation Profile: No results for input(s): INR, PROTIME in the last 168 hours. Cardiac Enzymes: No results for input(s): CKTOTAL, CKMB, CKMBINDEX, TROPONINI in the last 168 hours. BNP (last 3 results) No results for input(s): PROBNP in the last 8760 hours. HbA1C: No results for input(s): HGBA1C in the last 72 hours. CBG: No results for input(s): GLUCAP in the last 168 hours. Lipid Profile: No results for input(s): CHOL, HDL, LDLCALC, TRIG, CHOLHDL, LDLDIRECT in the last 72 hours. Thyroid Function Tests: No results for input(s): TSH, T4TOTAL, FREET4, T3FREE, THYROIDAB in the last 72 hours. Anemia Panel: No results for input(s): VITAMINB12, FOLATE, FERRITIN, TIBC, IRON, RETICCTPCT in the last 72 hours. Sepsis Labs: No results for input(s): PROCALCITON, LATICACIDVEN in the last 168 hours.  No results found for this or any previous visit (from the past 240 hour(s)).       Radiology Studies: No results  found.      Scheduled Meds: . atorvastatin  40 mg Oral Daily  . busPIRone  10 mg Oral TID  . carvedilol  3.125 mg Oral BID WC  . chlorhexidine  15 mL Mouth Rinse BID  . digoxin  0.125 mg Oral Daily  . fluticasone furoate-vilanterol  1 puff Inhalation Daily  . magnesium oxide  400 mg Oral Daily  . mouth rinse  15 mL Mouth Rinse q12n4p  . phenytoin  300 mg Oral BID  . pregabalin  75 mg Oral TID  . QUEtiapine  200 mg Oral Daily  . sacubitril-valsartan  1 tablet Oral BID   Continuous Infusions: . sodium chloride irrigation       LOS: 77 days   Time spent= 15 mins    Connelly Spruell , MD [facility]  If 7PM-7AM, please contact night-coverage  05/10/2020, 12:11 PM

## 2020-05-11 LAB — BASIC METABOLIC PANEL
Anion gap: 11 (ref 5–15)
BUN: 7 mg/dL (ref 6–20)
CO2: 21 mmol/L — ABNORMAL LOW (ref 22–32)
Calcium: 9.1 mg/dL (ref 8.9–10.3)
Chloride: 95 mmol/L — ABNORMAL LOW (ref 98–111)
Creatinine, Ser: 0.52 mg/dL — ABNORMAL LOW (ref 0.61–1.24)
GFR calc Af Amer: >60 mL/min (ref >60)
GFR calc non Af Amer: >60 mL/min (ref >60)
Glucose, Bld: 88 mg/dL (ref 70–99)
Potassium: 4.7 mmol/L (ref 3.5–5.1)
Sodium: 127 mmol/L — ABNORMAL LOW (ref 135–145)

## 2020-05-11 MED ORDER — PALIPERIDONE PALMITATE ER 234 MG/1.5ML IM SUSY
234.0000 mg | PREFILLED_SYRINGE | Freq: Once | INTRAMUSCULAR | Status: AC
  Administered 2020-05-12: 234 mg via INTRAMUSCULAR
  Filled 2020-05-11: qty 1.5

## 2020-05-11 MED ORDER — CLONAZEPAM 0.25 MG PO TBDP
0.5000 mg | ORAL_TABLET | Freq: Two times a day (BID) | ORAL | Status: DC | PRN
  Administered 2020-05-11: 0.5 mg via ORAL

## 2020-05-11 MED ORDER — CLONAZEPAM 0.25 MG PO TBDP
0.5000 mg | ORAL_TABLET | Freq: Two times a day (BID) | ORAL | Status: DC | PRN
  Filled 2020-05-11 (×2): qty 2

## 2020-05-11 MED ORDER — CLONAZEPAM 0.5 MG PO TABS
0.5000 mg | ORAL_TABLET | Freq: Once | ORAL | Status: AC
  Administered 2020-05-12: 0.5 mg via ORAL
  Filled 2020-05-11: qty 1

## 2020-05-11 NOTE — TOC Transition Note (Signed)
Transition of Care [facility]) - CM/SW Discharge Note   Patient Details  Name: Jim Dunn MRN: [id number] Date of Birth: 1965/11/06  Transition of Care [facility]) CM/SW Contact:  Kermit Balo, RN Phone Number: 05/11/2020, 4:33 PM   Clinical Narrative:    CM made more than 20 phone calls attempting to locate a group home. I have one to call back tomorrow to see if they have availability.  TOC following.     Barriers to Discharge: Continued Medical Work up, Unsafe home situation   Patient Goals and CMS Choice Patient states their goals for this hospitalization and ongoing recovery are:: to find a safe place to live CMS Medicare.gov Compare Post Acute Care list provided to:: Patient Choice offered to / list presented to : Patient  Discharge Placement                       Discharge Plan and Services                                     Social Determinants of Health (SDOH) Interventions     Readmission Risk Interventions Readmission Risk Prevention Plan 01/23/2020  Transportation Screening Complete  HRI or Home Care Consult Complete  Social Work Consult for Recovery Care Planning/Counseling Complete  Palliative Care Screening Not Applicable  Medication Review [employer]) (No Data)

## 2020-05-11 NOTE — Progress Notes (Signed)
Ok to give his monthly Invega 234mg  IM x1 in AM per Dr. .  Nelson Chimes, PharmD, BCIDP, AAHIVP, CPP Infectious Disease Pharmacist 05/11/2020 9:10 AM

## 2020-05-11 NOTE — Progress Notes (Addendum)
PROGRESS NOTE    Jim Dunn  GEZ:[id number] DOB: 21-Apr-1966 DOA: 02/23/2020 PCP: System, Pcp Not In   Brief Narrative:   54 year old male with past medical history of schizophrenia, seizure disorder, biventricular heart failure(Echo 01/2020 EF 25-30%with right-sided heart failure),severe tricuspid regurgitation, nicotine dependence and COPD who presents to [facility] emergency department with suicidal ideation and seizure.Patient explains that since he left the hospital several days ago(admitted6/17-6/19-was being managed for hyponatremia, thought to be multifactorial in origin. Patient left AMA on6/19 and that time had sodium of 132),he has been experiencing increasing fatigue. This increasing fatigue is associated with bilateral lower extremity tingling. Symptomsweresimilar to what prompted his presentation on 6/17. Additionally, he reported feeling depressed withsuicidal ideation, withouta plan. Patient reportedthat he hadattempted suicide in the distant past using pills but was unsuccessful and never sought medical attention. While the patient was waiting in the waiting room, he experienced a witnessed seizure while attempting to walk to the bathroom. Patient was immediately brought back to the emergency department for further evaluation. Patient was found to have substantial recurrent hyponatremia of 117 with concurrent hypokalemia of 2.6 and hypomagnesium of 1.4. Patient was administered 1236mg  of intravenous Fosphenytoinand initiated on low rate normal saline infusion. Patient was administered 2 g of intravenous magnesium and a 30 mEq of potassium.Patient's electrolytes have improved, no further seizure episodes at this time. Echo showed EF of 25-30%, cardiology recommended outpatient follow-up but otherwise continue Coreg, torsemide and Entresto.  Currently awaiting placement.   Assessment & Plan:   Principal Problem:   Hyponatremia Active Problems:    Chronic systolic CHF (congestive heart failure) (HCC)   Mixed hyperlipidemia   COPD (chronic obstructive pulmonary disease) (HCC)   Seizure disorder (HCC)   Paranoid schizophrenia (HCC)   Hypokalemia   Hypomagnesemia   Suicidal ideation   Noncompliance with medications   Subtherapeutic serum dilantin level   Seizure (HCC)   Recurrent dental infections status post full dental extraction 8/26-supportive care and follow-up outpatient dentist  Hyponatremia due to intravascular volume depletion-continue to monitor sodium levels, diuretics on hold.  Daily assess his volume status and give as needed diuretics.  Congestive heart failure reduced ejection fraction, EF 25%-still somewhat intravascular volume depletion therefore holding on diuretics.  Closely monitor and resume when appropriate  History of depression with suicidal ideation-would continue current home medications.  He will be getting a shot of Invega tomorrow morning, discussed with the pharmacist  History of seizure disorder-continue current medication  Obstructive sleep apnea-CPAP  COPD-stable  Peripheral neuropathy-on Lyrica 50 units 3 times daily  DVT prophylaxis: Patient is ambulatory Code Status: Full code Family Communication: Mother updated periodically  Status is: Inpatient  Currently patient is medically stable awaiting placement.  TOC team has been working on this.    Body mass index is 32.86 kg/m.    Subjective: Seen and examined in his room, no complaints.  Requesting a shot of Klonopin tomorrow with his Invega.  Examination: Constitutional: Not in acute distress Respiratory: Clear to auscultation bilaterally Cardiovascular: Normal sinus rhythm, no rubs Abdomen: Nontender nondistended good bowel sounds Musculoskeletal: No edema noted Skin: No rashes seen Neurologic: CN 2-12 grossly intact.  And nonfocal Psychiatric: Normal judgment and insight. Alert and oriented x 3. Normal mood.      Objective: Vitals:   05/10/20 2334 05/11/20 0304 05/11/20 0754 05/11/20 0926  BP: 130/76 124/65 92/78 132/84  Pulse: 77 75 87 71  Resp: 18 19 16    Temp: 98.3 F (36.8 C) 97.9 F (36.6  C) 98.6 F (37 C)   TempSrc: Oral Oral Oral   SpO2: 100% 100% 97%   Weight:      Height:       No intake or output data in the 24 hours ending 05/11/20 1155 Filed Weights   05/02/20 2350 05/08/20 0415 05/10/20 0757  Weight: 96.2 kg 98.9 kg 98 kg     Data Reviewed:   CBC: Recent Labs  Lab 05/09/20 0354  WBC 7.5  HGB 10.2*  HCT 32.4*  MCV 70.1*  PLT 199   Basic Metabolic Panel: Recent Labs  Lab 05/05/20 0658 05/08/20 0252 05/09/20 0354 05/10/20 0320 05/11/20 0731  NA 126* 125* 129* 125* 127*  K 4.0 4.2 4.2 4.3 4.7  CL 94* 93* 97* 93* 95*  CO2 23 25 25 24  21*  GLUCOSE 84 101* 88 96 88  BUN 6 5* 6 6 7   CREATININE 0.49* 0.48* 0.49* 0.63 0.52*  CALCIUM 8.9 8.9 9.4 9.0 9.1   GFR: Estimated Creatinine Clearance: 122.5 mL/min (A) (by C-G formula based on SCr of 0.52 mg/dL (L)). Liver Function Tests: No results for input(s): AST, ALT, ALKPHOS, BILITOT, PROT, ALBUMIN in the last 168 hours. No results for input(s): LIPASE, AMYLASE in the last 168 hours. No results for input(s): AMMONIA in the last 168 hours. Coagulation Profile: No results for input(s): INR, PROTIME in the last 168 hours. Cardiac Enzymes: No results for input(s): CKTOTAL, CKMB, CKMBINDEX, TROPONINI in the last 168 hours. BNP (last 3 results) No results for input(s): PROBNP in the last 8760 hours. HbA1C: No results for input(s): HGBA1C in the last 72 hours. CBG: No results for input(s): GLUCAP in the last 168 hours. Lipid Profile: No results for input(s): CHOL, HDL, LDLCALC, TRIG, CHOLHDL, LDLDIRECT in the last 72 hours. Thyroid Function Tests: No results for input(s): TSH, T4TOTAL, FREET4, T3FREE, THYROIDAB in the last 72 hours. Anemia Panel: No results for input(s): VITAMINB12, FOLATE, FERRITIN, TIBC,  IRON, RETICCTPCT in the last 72 hours. Sepsis Labs: No results for input(s): PROCALCITON, LATICACIDVEN in the last 168 hours.  No results found for this or any previous visit (from the past 240 hour(s)).       Radiology Studies: No results found.      Scheduled Meds: . atorvastatin  40 mg Oral Daily  . busPIRone  10 mg Oral TID  . carvedilol  3.125 mg Oral BID WC  . chlorhexidine  15 mL Mouth Rinse BID  . digoxin  0.125 mg Oral Daily  . fluticasone furoate-vilanterol  1 puff Inhalation Daily  . magnesium oxide  400 mg Oral Daily  . mouth rinse  15 mL Mouth Rinse q12n4p  . [START ON 05/12/2020] paliperidone  234 mg Intramuscular Once  . phenytoin  300 mg Oral BID  . pregabalin  75 mg Oral TID  . QUEtiapine  200 mg Oral Daily  . sacubitril-valsartan  1 tablet Oral BID   Continuous Infusions: . sodium chloride irrigation       LOS: 78 days   Time spent= 15 mins    Nakina Spatz , MD [facility]  If 7PM-7AM, please contact night-coverage  05/11/2020, 11:55 AM

## 2020-05-11 NOTE — Progress Notes (Signed)
Pt placed on cpap with no compplications

## 2020-05-12 LAB — BASIC METABOLIC PANEL
Anion gap: 8 (ref 5–15)
BUN: 6 mg/dL (ref 6–20)
CO2: 25 mmol/L (ref 22–32)
Calcium: 9 mg/dL (ref 8.9–10.3)
Chloride: 93 mmol/L — ABNORMAL LOW (ref 98–111)
Creatinine, Ser: 0.41 mg/dL — ABNORMAL LOW (ref 0.61–1.24)
GFR calc Af Amer: >60 mL/min (ref >60)
GFR calc non Af Amer: >60 mL/min (ref >60)
Glucose, Bld: 98 mg/dL (ref 70–99)
Potassium: 4.3 mmol/L (ref 3.5–5.1)
Sodium: 126 mmol/L — ABNORMAL LOW (ref 135–145)

## 2020-05-12 MED ORDER — CLONAZEPAM 0.25 MG PO TBDP
0.5000 mg | ORAL_TABLET | Freq: Two times a day (BID) | ORAL | Status: DC | PRN
  Administered 2020-05-15 – 2020-05-31 (×14): 0.5 mg via ORAL
  Filled 2020-05-12 (×15): qty 2

## 2020-05-12 NOTE — Progress Notes (Signed)
   05/12/20 1400  Clinical Encounter Type  Visited With Patient and family together  Visit Type Follow-up  Referral From Nurse  Consult/Referral To Chaplain   Chaplain provided blessing for Pt. Pt's mom, [name], was also in the room. Chaplain let Pt know he could reach out again.  This note was prepared by Chaplain Resident, Tacy Learn, MDiv. For questions, please contact by phone at [phone].

## 2020-05-12 NOTE — Progress Notes (Signed)
PROGRESS NOTE    Jim Dunn  RXV:[id number] DOB: 08-28-1966 DOA: 02/23/2020 PCP: System, Pcp Not In    Chief Complaint  Patient presents with  . Medical Clearance    Brief Narrative:  54 year old male with past medical history of schizophrenia, seizure disorder, biventricular heart failure(Echo 01/2020 EF 25-30%with right-sided heart failure),severe tricuspid regurgitation, nicotine dependence and COPD who presents to [facility] emergency department with suicidal ideation and seizure.Patient explains that since he left the hospital several days ago(admitted6/17-6/19-was being managed for hyponatremia, thought to be multifactorial in origin. Patient left AMA on6/19 and that time had sodium of 132),he has been experiencing increasing fatigue. This increasing fatigue is associated with bilateral lower extremity tingling. Symptomsweresimilar to what prompted his presentation on 6/17. Additionally, he reported feeling depressed withsuicidal ideation, withouta plan. Patient reportedthat he hadattempted suicide in the distant past using pills but was unsuccessful and never sought medical attention. While the patient was waiting in the waiting room, he experienced a witnessed seizure while attempting to walk to the bathroom. Patient was immediately brought back to the emergency department for further evaluation. Patient was found to have substantial recurrent hyponatremia of 117 with concurrent hypokalemia of 2.6 and hypomagnesium of 1.4. Patient was administered 1236mg  of intravenous Fosphenytoinand initiated on low rate normal saline infusion. Patient was administered 2 g of intravenous magnesium and a 30 mEq of potassium.Patient's electrolytes have improved, no further seizure episodes at this time. Echo showed EF of 25-30%, cardiology recommended outpatient follow-up but otherwise continue Coreg, torsemide and Entresto.  Currently awaiting placement.  Assessment &  Plan:   Principal Problem:   Hyponatremia Active Problems:   Chronic systolic CHF (congestive heart failure) (HCC)   Mixed hyperlipidemia   COPD (chronic obstructive pulmonary disease) (HCC)   Seizure disorder (HCC)   Paranoid schizophrenia (HCC)   Hypokalemia   Hypomagnesemia   Suicidal ideation   Noncompliance with medications   Subtherapeutic serum dilantin level   Seizure (HCC)   Recurrent dental infections status post full dental extraction 8/26-supportive care and follow-up outpatient dentist  Hyponatremia due to intravascular volume depletion-continue to monitor sodium levels, diuretics on hold.  Daily assess his volume status and give as needed diuretics.  Congestive heart failure reduced ejection fraction, EF 25%-still somewhat intravascular volume depletion therefore holding on diuretics.  Closely monitor and resume when appropriate  History of depression with suicidal ideation-would continue current home medications.  He will be getting a shot of Invega tomorrow morning, discussed with the pharmacist  History of seizure disorder-continue current medication  Obstructive sleep apnea-CPAP  COPD-stable  Peripheral neuropathy-on Lyrica 50 units 3 times daily   DVT prophylaxis: ambulatory.  Code Status: FULL CODE.  Family Communication: none at bedside.  Disposition:   Status is: Inpatient  Remains inpatient appropriate because:Unsafe d/c plan and Inpatient level of care appropriate due to severity of illness, hyponatremia.    Dispo: The patient is from: Home              Anticipated d/c is to: SNF              Anticipated d/c date is: > 3 days              Patient currently is not medically stable to d/c.       Consultants:   None.    Procedures: none.    Antimicrobials: none.    Subjective: No new complaints.   Objective: Vitals:   05/12/20 0727 05/12/20 1219 05/12/20 1223 05/12/20  1601  BP: (!) 162/83  (!) 91/58 113/79  Pulse: 78  95 97 95  Resp: 16 16 16 20   Temp: 97.9 F (36.6 C) 97.7 F (36.5 C) (!) 97.5 F (36.4 C) 98.8 F (37.1 C)  TempSrc: Oral Oral Oral Oral  SpO2: 100% 95% 99% 100%  Weight: 98 kg     Height:        Intake/Output Summary (Last 24 hours) at 05/12/2020 1831 Last data filed at 05/12/2020 1030 Gross per 24 hour  Intake 240 ml  Output --  Net 240 ml   Filed Weights   05/08/20 0415 05/10/20 0757 05/12/20 0727  Weight: 98.9 kg 98 kg 98 kg    Examination:  General exam: Appears calm and comfortable  Respiratory system: Clear to auscultation. Respiratory effort normal. Cardiovascular system: S1 & S2 heard, RRR. No JVD,  No pedal edema. Gastrointestinal system: Abdomen is nondistended, soft and nontender. . Normal bowel sounds heard. Central nervous system: Alert and oriented. No focal neurological deficits. Extremities: Symmetric 5 x 5 power. Skin: No rashes, lesions or ulcers Psychiatry:Mood & affect appropriate.     Data Reviewed: I have personally reviewed following labs and imaging studies  CBC: Recent Labs  Lab 05/09/20 0354  WBC 7.5  HGB 10.2*  HCT 32.4*  MCV 70.1*  PLT 199    Basic Metabolic Panel: Recent Labs  Lab 05/08/20 0252 05/09/20 0354 05/10/20 0320 05/11/20 0731 05/12/20 0343  NA 125* 129* 125* 127* 126*  K 4.2 4.2 4.3 4.7 4.3  CL 93* 97* 93* 95* 93*  CO2 25 25 24  21* 25  GLUCOSE 101* 88 96 88 98  BUN 5* 6 6 7 6   CREATININE 0.48* 0.49* 0.63 0.52* 0.41*  CALCIUM 8.9 9.4 9.0 9.1 9.0    GFR: Estimated Creatinine Clearance: 122.5 mL/min (A) (by C-G formula based on SCr of 0.41 mg/dL (L)).  Liver Function Tests: No results for input(s): AST, ALT, ALKPHOS, BILITOT, PROT, ALBUMIN in the last 168 hours.  CBG: No results for input(s): GLUCAP in the last 168 hours.   No results found for this or any previous visit (from the past 240 hour(s)).       Radiology Studies: No results found.      Scheduled Meds: . atorvastatin  40 mg Oral  Daily  . busPIRone  10 mg Oral TID  . carvedilol  3.125 mg Oral BID WC  . chlorhexidine  15 mL Mouth Rinse BID  . digoxin  0.125 mg Oral Daily  . fluticasone furoate-vilanterol  1 puff Inhalation Daily  . magnesium oxide  400 mg Oral Daily  . mouth rinse  15 mL Mouth Rinse q12n4p  . phenytoin  300 mg Oral BID  . pregabalin  75 mg Oral TID  . QUEtiapine  200 mg Oral Daily  . sacubitril-valsartan  1 tablet Oral BID   Continuous Infusions: . sodium chloride irrigation       LOS: 79 days       07/12/20, MD [facility]   To contact the attending provider between 7A-7P or the covering provider during after hours 7P-7A, please log into the web site [email or web address] and access using universal [place] password for that web site. If you do not have the password, please call the hospital operator.  05/12/2020, 6:31 PM

## 2020-05-12 NOTE — Progress Notes (Signed)
   05/12/20 1130  Clinical Encounter Type  Visited With Patient  Visit Type Follow-up  Referral From Nurse  Consult/Referral To Chaplain   Chaplain attempted to provide blessing, however, Pt was asleep. Chaplain will try again later today or tomorrow.  This note was prepared by Chaplain Resident, Tacy Learn, MDiv. For questions, please contact by phone at [phone].

## 2020-05-12 NOTE — Progress Notes (Signed)
Nutrition Follow-up  DOCUMENTATION CODES:   Obesity unspecified  INTERVENTION:   -Continue Magic cup TID with meals, each supplement provides 290 kcal and 9 grams of protein -Continue MVI with minerals daily -Downgrade diet to dysphagia 3 (advanced mechanical soft) for ease of intake -RD to sign off secondary to medical stability; please re-consult RD if further nutrition-related concerns arise  NUTRITION DIAGNOSIS:   Increased nutrient needs related to post-op healing as evidenced by estimated needs.  Ongoing  GOAL:   Patient will meet greater than or equal to 90% of their needs  Met  MONITOR:   PO intake, Supplement acceptance, Diet advancement, Labs, Weight trends, Skin, I & O's  REASON FOR ASSESSMENT:   Consult Assessment of nutrition requirement/status  ASSESSMENT:   54 year old male with past medical history of schizophrenia, seizure disorder, biventricular heart failure (Echo 01/2020 EF 25-30% with right-sided heart failure), severe tricuspid regurgitation, nicotine dependence and COPD who presents to [facility] emergency department with suicidal ideation and seizure.  8/26- s/pOPERATIONS: 1. Multiple extraction of tooth numbers2-13, 17, 18, 21-25,and27-31. 2.4Quadrants of alveoloplasty 3.Bilateral mandibular lingual exostoses reductions  Pt sleeping soundly at time of visit. He did not respond when name was called.   Pt continues with good appetite; noted meal completion 100%. Diet was downgraded to dysphagia 3 (advanced mechanical soft) for ease of intake secondary to dental extractions.   Per MD notes, pt medically stable for discharge, however, continues to await placement. TOC continues to follow for group home placement.   Labs reviewed: Na: 129.   Diet Order:   Diet Order            DIET DYS 3 Room service appropriate? Yes with Assist; Fluid consistency: Thin  Diet effective now                 EDUCATION NEEDS:    Education needs have been addressed  Skin:  Skin Assessment: Reviewed RN Assessment  Last BM:  05/10/20  Height:   Ht Readings from Last 1 Encounters:  04/29/20 5' 8"  (1.727 m)    Weight:   Wt Readings from Last 1 Encounters:  05/12/20 98 kg    Ideal Body Weight:  70 kg  BMI:  Body mass index is 32.86 kg/m.  Estimated Nutritional Needs:   Kcal:  1900-2100  Protein:  90-105 grams  Fluid:  >1.9 L    Loistine Chance, RD, LDN, [place] Registered Dietitian II Certified Diabetes Care and Education Specialist Please refer to [facility] for RD and/or RD on-call/weekend/after hours pager

## 2020-05-12 NOTE — Progress Notes (Signed)
Pt refusing cpap for the night, RT will continue to monitor as needed. 

## 2020-05-13 LAB — OSMOLALITY, URINE: Osmolality, Ur: 256 mOsm/kg — ABNORMAL LOW (ref 300–900)

## 2020-05-13 LAB — BASIC METABOLIC PANEL
Anion gap: 10 (ref 5–15)
BUN: 5 mg/dL — ABNORMAL LOW (ref 6–20)
CO2: 23 mmol/L (ref 22–32)
Calcium: 8.9 mg/dL (ref 8.9–10.3)
Chloride: 94 mmol/L — ABNORMAL LOW (ref 98–111)
Creatinine, Ser: 0.55 mg/dL — ABNORMAL LOW (ref 0.61–1.24)
GFR calc Af Amer: >60 mL/min (ref >60)
GFR calc non Af Amer: >60 mL/min (ref >60)
Glucose, Bld: 118 mg/dL — ABNORMAL HIGH (ref 70–99)
Potassium: 3.9 mmol/L (ref 3.5–5.1)
Sodium: 127 mmol/L — ABNORMAL LOW (ref 135–145)

## 2020-05-13 LAB — SODIUM, URINE, RANDOM: Sodium, Ur: 52 mmol/L

## 2020-05-13 LAB — OSMOLALITY: Osmolality: 267 mOsm/kg — ABNORMAL LOW (ref 275–295)

## 2020-05-13 NOTE — Progress Notes (Signed)
PROGRESS NOTE    Jim Dunn  [id number]:[id number] DOB: 1966-04-23 DOA: 02/23/2020 PCP: System, Pcp Not In    Chief Complaint  Patient presents with  . Medical Clearance    Brief Narrative:  54 year old male prior history of schizophrenia, seizure disorder, biventricular heart failure, severe tricuspid regurgitation, COPD presents to ED with suicidal ideation and seizures.  He was also found to have hyponatremia thought to be multifactorial in origin.   Currently patient does not have any seizures and his electrolytes have improved.  Last echocardiogram showed left ventricular ejection fraction of 25 to 30%.  PT OT evaluation recommending SNF placement.  Assessment & Plan:   Principal Problem:   Hyponatremia Active Problems:   Chronic systolic CHF (congestive heart failure) (HCC)   Mixed hyperlipidemia   COPD (chronic obstructive pulmonary disease) (HCC)   Seizure disorder (HCC)   Paranoid schizophrenia (HCC)   Hypokalemia   Hypomagnesemia   Suicidal ideation   Noncompliance with medications   Subtherapeutic serum dilantin level   Seizure (HCC)   Recurrent dental infections s/p full dental extraction on 8/26-21 Patient currently afebrile and denies any new complaints at this time recommend outpatient follow-up with dental on discharge.  Hyponatremia probably secondary to intravascular volume depletion versus medication related hyponatremia Currently Lasix on hold.  Patient appears to be euvolemic but his sodium levels have been around 125-127 over the last few days.  Patient asymptomatic.  Recheck sodium tomorrow. Urine and serum osmolality and urine sodium ordered for further evaluation of hyponatremia  Congestive heart failure reduced ejection fraction, EF 25%- Patient appears to be compensated at this time. Continue with Coreg 3.125 mg twice daily, dig 0.125 daily, Entresto 24-26 mg 1 tab twice daily  History of depression with suicidal ideation- No suicidal or homicidal  ideations at this time.  Patient had a Invega injection yesterday.  Continue with Seroquel.   History of seizure disorder- Continue with phenytoin 300 mg twice daily  Obstructive sleep apnea-CPAP  COPD-no wheezing heard on exam, stable  Peripheral neuropathy- Continue with Lyrica 75 mg 3 times daily.   DVT prophylaxis: ambulatory.  Code Status: FULL CODE.  Family Communication: none at bedside.  Disposition:   Status is: Inpatient  Remains inpatient appropriate because:Unsafe d/c plan and Inpatient level of care appropriate due to severity of illness, hyponatremia.    Dispo: The patient is from: Home              Anticipated d/c is to: SNF              Anticipated d/c date is: > 3 days              Patient currently is not medically stable to d/c.       Consultants:   None.    Procedures: none.    Antimicrobials: none.    Subjective: No new complaints.   Objective: Vitals:   05/13/20 0405 05/13/20 0659 05/13/20 0802 05/13/20 1100  BP: 107/61  (!) 108/55 (!) 105/55  Pulse: 70  75 74  Resp: 18  20 16   Temp: 98.3 F (36.8 C)  98.1 F (36.7 C) 98.1 F (36.7 C)  TempSrc: Oral  Oral Oral  SpO2: 99%  99% 96%  Weight:  98.4 kg    Height:        Intake/Output Summary (Last 24 hours) at 05/13/2020 1517 Last data filed at 05/13/2020 0800 Gross per 24 hour  Intake 360 ml  Output --  Net 360 ml  Filed Weights   05/10/20 0757 05/12/20 0727 05/13/20 0659  Weight: 98 kg 98 kg 98.4 kg    Examination:  General exam: Alert and comfortable, not in any kind of distress Respiratory system: Clear to auscultation bilaterally, no wheezing or rhonchi Cardiovascular system: S1-S2 heard, regular rate rhythm, no JVD no pedal edema  Gastrointestinal system: Abdomen is soft, nontender bowel sounds normal Central nervous system: Alert and oriented. Extremities: No pedal edema. Skin: No rashes seen Psychiatry mood is appropriate.     Data Reviewed: I have  personally reviewed following labs and imaging studies  CBC: Recent Labs  Lab 05/09/20 0354  WBC 7.5  HGB 10.2*  HCT 32.4*  MCV 70.1*  PLT 199    Basic Metabolic Panel: Recent Labs  Lab 05/09/20 0354 05/10/20 0320 05/11/20 0731 05/12/20 0343 05/13/20 0418  NA 129* 125* 127* 126* 127*  K 4.2 4.3 4.7 4.3 3.9  CL 97* 93* 95* 93* 94*  CO2 25 24 21* 25 23  GLUCOSE 88 96 88 98 118*  BUN 6 6 7 6  5*  CREATININE 0.49* 0.63 0.52* 0.41* 0.55*  CALCIUM 9.4 9.0 9.1 9.0 8.9    GFR: Estimated Creatinine Clearance: 122.8 mL/min (A) (by C-G formula based on SCr of 0.55 mg/dL (L)).  Liver Function Tests: No results for input(s): AST, ALT, ALKPHOS, BILITOT, PROT, ALBUMIN in the last 168 hours.  CBG: No results for input(s): GLUCAP in the last 168 hours.   No results found for this or any previous visit (from the past 240 hour(s)).       Radiology Studies: No results found.      Scheduled Meds: . atorvastatin  40 mg Oral Daily  . busPIRone  10 mg Oral TID  . carvedilol  3.125 mg Oral BID WC  . chlorhexidine  15 mL Mouth Rinse BID  . digoxin  0.125 mg Oral Daily  . fluticasone furoate-vilanterol  1 puff Inhalation Daily  . magnesium oxide  400 mg Oral Daily  . mouth rinse  15 mL Mouth Rinse q12n4p  . phenytoin  300 mg Oral BID  . pregabalin  75 mg Oral TID  . QUEtiapine  200 mg Oral Daily  . sacubitril-valsartan  1 tablet Oral BID   Continuous Infusions: . sodium chloride irrigation       LOS: 80 days       , MD [facility]   To contact the attending provider between 7A-7P or the covering provider during after hours 7P-7A, please log into the web site [email or web address] and access using universal [place] password for that web site. If you do not have the password, please call the hospital operator.  05/13/2020, 3:17 PM

## 2020-05-14 LAB — BASIC METABOLIC PANEL
Anion gap: 8 (ref 5–15)
BUN: 5 mg/dL — ABNORMAL LOW (ref 6–20)
CO2: 24 mmol/L (ref 22–32)
Calcium: 9.3 mg/dL (ref 8.9–10.3)
Chloride: 96 mmol/L — ABNORMAL LOW (ref 98–111)
Creatinine, Ser: 0.43 mg/dL — ABNORMAL LOW (ref 0.61–1.24)
GFR calc Af Amer: >60 mL/min (ref >60)
GFR calc non Af Amer: >60 mL/min (ref >60)
Glucose, Bld: 87 mg/dL (ref 70–99)
Potassium: 4.2 mmol/L (ref 3.5–5.1)
Sodium: 128 mmol/L — ABNORMAL LOW (ref 135–145)

## 2020-05-14 NOTE — Progress Notes (Signed)
PROGRESS NOTE    Jim Dunn  [id number]:[id number] DOB: December 06, 1965 DOA: 02/23/2020 PCP: System, Pcp Not In    Chief Complaint  Patient presents with  . Medical Clearance    Brief Narrative:  54 year old male prior history of schizophrenia, seizure disorder, biventricular heart failure, severe tricuspid regurgitation, COPD presents to ED with suicidal ideation and seizures.  He was also found to have hyponatremia thought to be multifactorial in origin.   Currently patient does not have any seizures and his electrolytes have improved.  Last echocardiogram showed left ventricular ejection fraction of 25 to 30%.  PT OT evaluation recommending SNF placement. Pt seen and examined at bedside. No new complaints. As per RN, he is drinking multiple cups of coffee and soda.  Fluid restriction ordered.   Assessment & Plan:   Principal Problem:   Hyponatremia Active Problems:   Chronic systolic CHF (congestive heart failure) (HCC)   Mixed hyperlipidemia   COPD (chronic obstructive pulmonary disease) (HCC)   Seizure disorder (HCC)   Paranoid schizophrenia (HCC)   Hypokalemia   Hypomagnesemia   Suicidal ideation   Noncompliance with medications   Subtherapeutic serum dilantin level   Seizure (HCC)   Recurrent dental infections s/p full dental extraction on 8/26-21 Patient currently afebrile and denies any new complaints at this time recommend outpatient follow-up with dental on discharge.  Hyponatremia  hypotonic  hyponatremia versus medication related hyponatremia  Currently Lasix on hold.  Patient appears to be euvolemic but his sodium levels have been around 125-127 over the last few days.  Patient asymptomatic.  Recheck sodium is 128.  Urine and serum osmolality and urine sodium ordered for further evaluation of hyponatremia.  Urine osmolality is 256, urine sodium is 52, serum osmo is 267.  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 Fluid restriction ordered and discussed with the patient.    Congestive heart failure reduced ejection fraction, EF 25%- Patient appears to be compensated at this time. Continue with Coreg 3.125 mg twice daily, dig 0.125 daily, Entresto 24-26 mg 1 tab twice daily  History of depression with suicidal ideation- No suicidal or homicidal ideations at this time.  Patient had a Invega injection yesterday.  Continue with Seroquel.   History of seizure disorder- Continue with phenytoin 300 mg twice daily  Obstructive sleep apnea-CPAP  COPD-no wheezing heard on exam, stable  Peripheral neuropathy- Continue with Lyrica 75 mg 3 times daily.   DVT prophylaxis: ambulatory.  Code Status: FULL CODE.  Family Communication: none at bedside.  Disposition:   Status is: Inpatient  Remains inpatient appropriate because:Unsafe d/c plan and Inpatient level of care appropriate due to severity of illness, hyponatremia.    Dispo: The patient is from: Home              Anticipated d/c is to: SNF              Anticipated d/c date is: > 3 days              Patient currently is not medically stable to d/c.       Consultants:   None.    Procedures: none.    Antimicrobials: none.    Subjective: No new complaints.   Objective: Vitals:   05/14/20 0407 05/14/20 0812 05/14/20 1137 05/14/20 1534  BP: 125/75 (!) 151/81 (!) 150/87 115/72  Pulse: 74 72 72 92  Resp: 18 20 20 20   Temp: 97.8 F (36.6 C) 97.9 F (36.6 C) (!) 97.4 F (36.3 C) 98.2 F (  36.8 C)  TempSrc: Oral Oral Oral Oral  SpO2: 99% 99% 100% 100%  Weight:      Height:        Intake/Output Summary (Last 24 hours) at 05/14/2020 1910 Last data filed at 05/14/2020 0814 Gross per 24 hour  Intake 240 ml  Output 600 ml  Net -360 ml   Filed Weights   05/10/20 0757 05/12/20 0727 05/13/20 0659  Weight: 98 kg 98 kg 98.4 kg    Examination:  General exam: Alert and comfortable.  Respiratory system: clear to auscultation, no wheezing or rhonchi.  Cardiovascular system: S1S2,  RRR, no JVD,  Gastrointestinal system: Abdomen is soft, non tender non distended, bowel sounds wnl.  Central nervous system: alert and oriented.  Extremities: No pedal edema.  Skin: No rashes seen.  Psychiatry :Mood is appropriate.     Data Reviewed: I have personally reviewed following labs and imaging studies  CBC: Recent Labs  Lab 05/09/20 0354  WBC 7.5  HGB 10.2*  HCT 32.4*  MCV 70.1*  PLT 199    Basic Metabolic Panel: Recent Labs  Lab 05/10/20 0320 05/11/20 0731 05/12/20 0343 05/13/20 0418 05/14/20 0906  NA 125* 127* 126* 127* 128*  K 4.3 4.7 4.3 3.9 4.2  CL 93* 95* 93* 94* 96*  CO2 24 21* 25 23 24   GLUCOSE 96 88 98 118* 87  BUN 6 7 6  5* 5*  CREATININE 0.63 0.52* 0.41* 0.55* 0.43*  CALCIUM 9.0 9.1 9.0 8.9 9.3    GFR: Estimated Creatinine Clearance: 122.8 mL/min (A) (by C-G formula based on SCr of 0.43 mg/dL (L)).  Liver Function Tests: No results for input(s): AST, ALT, ALKPHOS, BILITOT, PROT, ALBUMIN in the last 168 hours.  CBG: No results for input(s): GLUCAP in the last 168 hours.   No results found for this or any previous visit (from the past 240 hour(s)).       Radiology Studies: No results found.      Scheduled Meds: . atorvastatin  40 mg Oral Daily  . busPIRone  10 mg Oral TID  . carvedilol  3.125 mg Oral BID WC  . chlorhexidine  15 mL Mouth Rinse BID  . digoxin  0.125 mg Oral Daily  . fluticasone furoate-vilanterol  1 puff Inhalation Daily  . magnesium oxide  400 mg Oral Daily  . mouth rinse  15 mL Mouth Rinse q12n4p  . phenytoin  300 mg Oral BID  . pregabalin  75 mg Oral TID  . QUEtiapine  200 mg Oral Daily  . sacubitril-valsartan  1 tablet Oral BID   Continuous Infusions: . sodium chloride irrigation       LOS: 81 days       , MD [facility]   To contact the attending provider between 7A-7P or the covering provider during after hours 7P-7A, please log into the web site [email or web address] and  access using universal [place] password for that web site. If you do not have the password, please call the hospital operator.  05/14/2020, 7:10 PM

## 2020-05-15 LAB — SODIUM: Sodium: 127 mmol/L — ABNORMAL LOW (ref 135–145)

## 2020-05-15 NOTE — Progress Notes (Signed)
Patient refused CPAP tonight 

## 2020-05-15 NOTE — Progress Notes (Signed)
PROGRESS NOTE    Jim Dunn  WUJ:[id number] DOB: 21-Aug-1966 DOA: 02/23/2020 PCP: System, Pcp Not In    Chief Complaint  Patient presents with  . Medical Clearance    Brief Narrative:  54 year old male prior history of schizophrenia, seizure disorder, biventricular heart failure, severe tricuspid regurgitation, COPD presents to ED with suicidal ideation and seizures.  He was also found to have hyponatremia thought to be multifactorial in origin.   Currently patient does not have any seizures and his electrolytes have improved.  Last echocardiogram showed left ventricular ejection fraction of 25 to 30%.  PT OT evaluation recommending SNF placement. As per RN, he is drinking multiple cups of coffee and soda.  Fluid restriction ordered. Pt seen and examined at bedside, sleeping comfortably, no new complaints.   Assessment & Plan:   Principal Problem:   Hyponatremia Active Problems:   Chronic systolic CHF (congestive heart failure) (HCC)   Mixed hyperlipidemia   COPD (chronic obstructive pulmonary disease) (HCC)   Seizure disorder (HCC)   Paranoid schizophrenia (HCC)   Hypokalemia   Hypomagnesemia   Suicidal ideation   Noncompliance with medications   Subtherapeutic serum dilantin level   Seizure (HCC)   Recurrent dental infections s/p full dental extraction on 8/26-21 Patient currently afebrile and denies any dental pain.  recommend outpatient follow-up with dental on discharge. Pt denies any new complaints.   Hyponatremia  hypotonic  hyponatremia versus medication related hyponatremia  Currently Lasix on hold.  Patient appears to be euvolemic but his sodium levels have been around 125-127 over the last few days.  Patient asymptomatic.  Recheck sodium is 127.  Urine and serum osmolality and urine sodium ordered for further evaluation of hyponatremia.  Urine osmolality is 256, urine sodium is 52, serum osmo is 267.  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 Fluid restriction ordered and  discussed with the patient.  Pt asymptomatic.   Congestive heart failure reduced ejection fraction, EF 25%- Patient appears to be compensated at this time. Continue with Coreg 3.125 mg twice daily, dig 0.125 daily, Entresto 24-26 mg 1 tab twice daily  History of depression with suicidal ideation- No suicidal or homicidal ideations at this time.  Patient had a Invega injection.  Continue with Seroquel.   History of seizure disorder- Continue with phenytoin 300 mg twice daily  Obstructive sleep apnea- resume CPAP  COPD-no wheezing heard on exam, stable  Peripheral neuropathy- Continue with Lyrica 75 mg 3 times daily.   DVT prophylaxis: ambulatory.  Code Status: FULL CODE.  Family Communication: none at bedside.  Disposition:   Status is: Inpatient  Remains inpatient appropriate because:Unsafe d/c plan and Inpatient level of care appropriate due to severity of illness, hyponatremia.    Dispo: The patient is from: Home              Anticipated d/c is to: SNF              Anticipated d/c date is: > 3 days              Patient currently is not medically stable to d/c.       Consultants:   None.    Procedures: none.    Antimicrobials: none.    Subjective: No chest pain or sob, no dizziness.   Objective: Vitals:   05/15/20 0635 05/15/20 0830 05/15/20 1155 05/15/20 1649  BP:  (!) 143/91 122/84 116/77  Pulse:  77 77 81  Resp:  18 16 16   Temp:  97.9  F (36.6 C) 97.6 F (36.4 C) 98.5 F (36.9 C)  TempSrc:  Oral Oral Oral  SpO2:  98% (!) 85% 99%  Weight: 95.3 kg     Height:        Intake/Output Summary (Last 24 hours) at 05/15/2020 1918 Last data filed at 05/14/2020 2330 Gross per 24 hour  Intake 240 ml  Output --  Net 240 ml   Filed Weights   05/12/20 0727 05/13/20 0659 05/15/20 0635  Weight: 98 kg 98.4 kg 95.3 kg    Examination:  General exam: alert and comfortable.  Respiratory system: Clear to auscultation, no wheezing or rhonchi.   Cardiovascular system: S1S2, RRR, no JVD,  Gastrointestinal system: Abdomen is soft, non tender non distended. Bowel sounds wnl.  Central nervous system: Alert and oriented.  Extremities: No pedal edema.  Skin: No rashes seen.  Psychiatry :Mood is appropriate.     Data Reviewed: I have personally reviewed following labs and imaging studies  CBC: Recent Labs  Lab 05/09/20 0354  WBC 7.5  HGB 10.2*  HCT 32.4*  MCV 70.1*  PLT 199    Basic Metabolic Panel: Recent Labs  Lab 05/10/20 0320 05/10/20 0320 05/11/20 0731 05/12/20 0343 05/13/20 0418 05/14/20 0906 05/15/20 0956  NA 125*   < > 127* 126* 127* 128* 127*  K 4.3  --  4.7 4.3 3.9 4.2  --   CL 93*  --  95* 93* 94* 96*  --   CO2 24  --  21* 25 23 24   --   GLUCOSE 96  --  88 98 118* 87  --   BUN 6  --  7 6 5* 5*  --   CREATININE 0.63  --  0.52* 0.41* 0.55* 0.43*  --   CALCIUM 9.0  --  9.1 9.0 8.9 9.3  --    < > = values in this interval not displayed.    GFR: Estimated Creatinine Clearance: 121 mL/min (A) (by C-G formula based on SCr of 0.43 mg/dL (L)).  Liver Function Tests: No results for input(s): AST, ALT, ALKPHOS, BILITOT, PROT, ALBUMIN in the last 168 hours.  CBG: No results for input(s): GLUCAP in the last 168 hours.   No results found for this or any previous visit (from the past 240 hour(s)).       Radiology Studies: No results found.      Scheduled Meds: . atorvastatin  40 mg Oral Daily  . busPIRone  10 mg Oral TID  . carvedilol  3.125 mg Oral BID WC  . chlorhexidine  15 mL Mouth Rinse BID  . digoxin  0.125 mg Oral Daily  . fluticasone furoate-vilanterol  1 puff Inhalation Daily  . magnesium oxide  400 mg Oral Daily  . mouth rinse  15 mL Mouth Rinse q12n4p  . phenytoin  300 mg Oral BID  . pregabalin  75 mg Oral TID  . QUEtiapine  200 mg Oral Daily  . sacubitril-valsartan  1 tablet Oral BID   Continuous Infusions: . sodium chloride irrigation       LOS: 82 days        , MD [facility]   To contact the attending provider between 7A-7P or the covering provider during after hours 7P-7A, please log into the web site [email or web address] and access using universal [place] password for that web site. If you do not have the password, please call the hospital operator.  05/15/2020, 7:18 PM

## 2020-05-16 NOTE — Progress Notes (Signed)
Patient refused CPAP for the night  

## 2020-05-16 NOTE — Progress Notes (Signed)
Pt placed on full face cpap and tolerating well at this time.  RT will continue to monitor.  

## 2020-05-16 NOTE — Progress Notes (Signed)
PROGRESS NOTE    Jim Dunn  ATF:[id number] DOB: 1965/11/15 DOA: 02/23/2020 PCP: System, Pcp Not In    Chief Complaint  Patient presents with  . Medical Clearance    Brief Narrative:  54 year old male prior history of schizophrenia, seizure disorder, biventricular heart failure, severe tricuspid regurgitation, COPD presents to ED with suicidal ideation and seizures.  He was also found to have hyponatremia thought to be multifactorial in origin.   Currently patient does not have any seizures and his electrolytes have improved.  Last echocardiogram showed left ventricular ejection fraction of 25 to 30%.  PT OT evaluation recommending SNF placement. As per RN, he is drinking multiple cups of coffee and soda.  Fluid restriction ordered. Discussed with RN.   Assessment & Plan:   Principal Problem:   Hyponatremia Active Problems:   Chronic systolic CHF (congestive heart failure) (HCC)   Mixed hyperlipidemia   COPD (chronic obstructive pulmonary disease) (HCC)   Seizure disorder (HCC)   Paranoid schizophrenia (HCC)   Hypokalemia   Hypomagnesemia   Suicidal ideation   Noncompliance with medications   Subtherapeutic serum dilantin level   Seizure (HCC)   Recurrent dental infections s/p full dental extraction on 8/26-21 Patient currently afebrile and denies any dental pain.  recommend outpatient follow-up with dental on discharge. Pt denies any dental pain.   Hyponatremia  hypotonic  hyponatremia versus medication related hyponatremia  Currently Lasix on hold.  Patient appears to be euvolemic but his sodium levels have been around 125-127 over the last few days.  Patient asymptomatic.  Recheck sodium is 127.  Urine and serum osmolality and urine sodium ordered for further evaluation of hyponatremia.  Urine osmolality is 256, urine sodium is 52, serum osmo is 267.  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 Fluid restriction ordered and discussed with the patient.  Pt asymptomatic.    Congestive heart failure reduced ejection fraction, EF 25%- Patient appears to be compensated at this time. Continue with Coreg 3.125 mg twice daily, dig 0.125 daily, Entresto 24-26 mg 1 tab twice daily  History of depression with suicidal ideation- No suicidal or Homicidal ideations.   Patient had a Invega injection.  Continue with Seroquel.   History of seizure disorder- Continue with phenytoin 300 mg twice daily  Obstructive sleep apnea- resume CPAP  COPD-No wheezing heard, continue with bronchodilators prn.   Peripheral neuropathy- Continue with Lyrica 75 mg 3 times daily.   DVT prophylaxis: ambulatory.  Code Status: FULL CODE.  Family Communication: none at bedside.  Disposition:   Status is: Inpatient  Remains inpatient appropriate because:Unsafe d/c plan and Inpatient level of care appropriate due to severity of illness, hyponatremia.    Dispo: The patient is from: Home              Anticipated d/c is to: SNF              Anticipated d/c date is: > 3 days              Patient currently is not medically stable to d/c.       Consultants:   None.    Procedures: none.    Antimicrobials: none.    Subjective: No dizziness, no headache, discussed about  fluid restriction.   Objective: Vitals:   05/16/20 0335 05/16/20 0726 05/16/20 1114 05/16/20 1608  BP: 112/75 (!) 142/82 129/82 99/60  Pulse: 77 81 86 96  Resp: 19 16 18 20   Temp: 98.2 F (36.8 C) 97.9 F (36.6 C)  97.8 F (36.6 C) 97.6 F (36.4 C)  TempSrc:  Oral Oral Oral  SpO2: 99% 100% 99% 100%  Weight:      Height:        Intake/Output Summary (Last 24 hours) at 05/16/2020 1635 Last data filed at 05/16/2020 0655 Gross per 24 hour  Intake 120 ml  Output --  Net 120 ml   Filed Weights   05/12/20 0727 05/13/20 0659 05/15/20 0635  Weight: 98 kg 98.4 kg 95.3 kg    Examination:  General exam: Sleeping comfortably.  Respiratory system: clear to auscultation, no wheezing heard. No  tachypnea.  Cardiovascular system: S1S2, heard, RRR, no JVD, no pedal edema.  Gastrointestinal system: Abdomen is soft, non tender, non distended, bowel sounds wnl.  Central nervous system: Alert and comfortable, grossly non focal.  Extremities: No pedal edema.  Skin: No rashes seen.  Psychiatry :Mood is appropriate.     Data Reviewed: I have personally reviewed following labs and imaging studies  CBC: No results for input(s): WBC, NEUTROABS, HGB, HCT, MCV, PLT in the last 168 hours.  Basic Metabolic Panel: Recent Labs  Lab 05/10/20 0320 05/10/20 0320 05/11/20 0731 05/12/20 0343 05/13/20 0418 05/14/20 0906 05/15/20 0956  NA 125*   < > 127* 126* 127* 128* 127*  K 4.3  --  4.7 4.3 3.9 4.2  --   CL 93*  --  95* 93* 94* 96*  --   CO2 24  --  21* 25 23 24   --   GLUCOSE 96  --  88 98 118* 87  --   BUN 6  --  7 6 5* 5*  --   CREATININE 0.63  --  0.52* 0.41* 0.55* 0.43*  --   CALCIUM 9.0  --  9.1 9.0 8.9 9.3  --    < > = values in this interval not displayed.    GFR: Estimated Creatinine Clearance: 121 mL/min (A) (by C-G formula based on SCr of 0.43 mg/dL (L)).  Liver Function Tests: No results for input(s): AST, ALT, ALKPHOS, BILITOT, PROT, ALBUMIN in the last 168 hours.  CBG: No results for input(s): GLUCAP in the last 168 hours.   No results found for this or any previous visit (from the past 240 hour(s)).       Radiology Studies: No results found.      Scheduled Meds: . atorvastatin  40 mg Oral Daily  . busPIRone  10 mg Oral TID  . carvedilol  3.125 mg Oral BID WC  . chlorhexidine  15 mL Mouth Rinse BID  . digoxin  0.125 mg Oral Daily  . fluticasone furoate-vilanterol  1 puff Inhalation Daily  . magnesium oxide  400 mg Oral Daily  . mouth rinse  15 mL Mouth Rinse q12n4p  . phenytoin  300 mg Oral BID  . pregabalin  75 mg Oral TID  . QUEtiapine  200 mg Oral Daily  . sacubitril-valsartan  1 tablet Oral BID   Continuous Infusions: . sodium chloride  irrigation       LOS: 83 days       , MD [facility]   To contact the attending provider between 7A-7P or the covering provider during after hours 7P-7A, please log into the web site [email or web address] and access using universal [place] password for that web site. If you do not have the password, please call the hospital operator.  05/16/2020, 4:35 PM

## 2020-05-17 LAB — BASIC METABOLIC PANEL WITH GFR
Anion gap: 10 (ref 5–15)
BUN: 16 mg/dL (ref 6–20)
CO2: 24 mmol/L (ref 22–32)
Calcium: 8.8 mg/dL — ABNORMAL LOW (ref 8.9–10.3)
Chloride: 90 mmol/L — ABNORMAL LOW (ref 98–111)
Creatinine, Ser: 0.64 mg/dL (ref 0.61–1.24)
GFR calc Af Amer: >60 mL/min
GFR calc non Af Amer: >60 mL/min
Glucose, Bld: 97 mg/dL (ref 70–99)
Potassium: 4.5 mmol/L (ref 3.5–5.1)
Sodium: 124 mmol/L — ABNORMAL LOW (ref 135–145)

## 2020-05-17 MED ORDER — FUROSEMIDE 40 MG PO TABS
40.0000 mg | ORAL_TABLET | Freq: Every day | ORAL | Status: DC
  Administered 2020-05-17 – 2020-06-08 (×23): 40 mg via ORAL
  Filled 2020-05-17 (×23): qty 1

## 2020-05-17 MED ORDER — ZOLPIDEM TARTRATE 5 MG PO TABS
10.0000 mg | ORAL_TABLET | Freq: Every evening | ORAL | Status: DC | PRN
  Administered 2020-05-17 – 2020-06-07 (×20): 10 mg via ORAL
  Filled 2020-05-17 (×20): qty 2

## 2020-05-17 MED ORDER — IPRATROPIUM-ALBUTEROL 20-100 MCG/ACT IN AERS
1.0000 | INHALATION_SPRAY | Freq: Four times a day (QID) | RESPIRATORY_TRACT | Status: DC | PRN
  Filled 2020-05-17: qty 4

## 2020-05-17 NOTE — Progress Notes (Signed)
Patient refused CPAP for the night  

## 2020-05-17 NOTE — Progress Notes (Signed)
[facility] PROGRESS NOTE  Jim Dunn VOH:[id number] DOB: 1966/03/15 DOA: 02/23/2020 PCP: System, Pcp Not In  Status: Inpatient---Remains inpatient appropriate because:Altered mental status and Unsafe d/c plan   Dispo: The patient is from: Home              Anticipated d/c is to: Group home              Anticipated d/c date is: > 3 days              Patient currently is not medically stable to d/c. **NEEDS SAFE DC PLAN** Currently awaiting placement, TOC team working on this.  Mother is currently working to obtain Medicaid/guardianship. Unsafe for discharge based on underlying schizophrenia and history of recurrent noncompliance which led to patient's current hospitalization  Discussed with LCSW/case management. Mother presented to hospital on 8/17 to work with patient regarding Medicaid application.  She also obtained a completed FL2 form which was requested by [facility].  **As of 8/26 TOC has received information from group home outside of [facility] that they are coming the week of 8/30 but unfortunately visit did not occur  Code Status: Full Family Communication: 8/26 updated patient's mother Jim Dunn bedside [ [phone] or [phone]] DVT prophylaxis: 8/5 refused Lovenox therefore switched to SCDs since ambulating without difficulty Vaccination status: Vaccinated   HPI (time of admission): 54 year old male with past medical history of schizophrenia, seizure disorder, biventricular heart failure(Echo 01/2020 EF 25-30%with right-sided heart failure),severe tricuspid regurgitation, nicotine dependence and COPD who presents to [facility] emergency department with suicidal ideation and seizure.Patient explains that since he left the hospital several days ago(admitted6/17-6/19-was being managed for hyponatremia, thought to be multifactorial in origin. Patient left AMA on6/19 and that time had sodium of 132),he has been experiencing increasing fatigue. This  increasing fatigue is associated with bilateral lower extremity tingling. Symptomsweresimilar to what prompted his presentation on 6/17. Additionally, he reported feeling depressed withsuicidal ideation, withouta plan. Patient reportedthat he hadattempted suicide in the distant past using pills but was unsuccessful and never sought medical attention. While the patient was waiting in the waiting room, he experienced a witnessed seizure while attempting to walk to the bathroom. Patient was immediately brought back to the emergency department for further evaluation. Patient was found to have substantial recurrent hyponatremia of 117 with concurrent hypokalemia of 2.6 and hypomagnesium of 1.4. Patient was administered 1236mg  of intravenous Fosphenytoinand initiated on low rate normal saline infusion. Patient was administered 2 g of intravenous magnesium and a 30 mEq of potassium.  Patient's electrolytes have improved, no further seizure episodes at this time.  Echo showed EF of 25-30%, cardiology recommended outpatient follow-up but otherwise continue Coreg, torsemide and Entresto.    Subjective: Sitting up in chair.  Reported continued issues with insomnia but denied orthopnea or trouble breathing as reason he could not sleep.  Requesting Lunesta if available.  Confirmed that he felt that his abdominal girth has increased over the past couple of weeks.  Objective: Vitals:   05/17/20 0340 05/17/20 0720  BP: 95/65 126/86  Pulse: 82 85  Resp: 18 18  Temp: 97.8 F (36.6 C) 97.7 F (36.5 C)  SpO2: 99% 100%    Intake/Output Summary (Last 24 hours) at 05/17/2020 1046 Last data filed at 05/17/2020 0745 Gross per 24 hour  Intake 840 ml  Output --  Net 840 ml   Filed Weights   05/12/20 0727 05/13/20 0659 05/15/20 0635  Weight: 98 kg 98.4 kg 95.3 kg  Exam: Constitutional: NAD, mildly anxious but reports is significantly improved as compared to overnight, comfortable Oral: No bleeding  from mouth.  Swelling has nearly resolved and speech more clear as edema has decreased.  Tolerating diet. Respiratory: clear to auscultation bilaterally on posterior exam, Normal respiratory effort at rest.  Room air Cardiovascular: Regular rate, no murmurs / rubs / gallops. No lower extremity edema.  No JVD.  Abdomen: no tenderness, Bowel sounds positive.  Increasing abdominal girth Neurologic: CN 2-12 grossly intact. Sensation intact,Strength 5/5 x all 4 extremities. Ambulates independently Psychiatric: Alert and oriented x 3 although does not have appropriate insight or capacity to manage care independently   Assessment/Plan: Recurrent dental abscesses status post full dental extraction on 8/26 Recurrent.  Initial episode in July that responded to IV antibiotics recurrent episode in August.  Both with confirmed dental abscess orthopantogram in both episodes responsive to IV Unasyn. Status post full dental extraction on 8/26.  Dental surgeon recommends   follow-up with dental medicine for evaluation of healing and suture removal once discharged.  Hyponatremia with seizures/multifactorial etiology: 1. Intravascular dehydration, resolved 2.  SIADH 3.  Chronic systolic heart failure intermittent volume overload At time of admission diuretics torsemide & metolazone were held and subsequently  discontinued.   Weight had gone up and suspected evolving heart failure therefore Lasix was increased to BID dosing x 3 days and transitioned to 60 mg daily.   9/2 sodium down to 126-weight up slightly but does not appear consistent with heart failure therefore have decreased Lasix back to 40 mg daily Week of 9/4: Due to persistent hyponatremia covering attending physician obtained SIADH labs which confirmed SIADH physiology.  Lasix was discontinued.  Placed on fluid restriction which patient has been inconsistent in following -sodium subsequently decreased despite FR.  Weight has also increased. 9/13 given  increasing weight and decreasing sodium over 24 hours despite adhering to 1200 cc FR have opted to resume low-dose Lasix 40 mg daily Plan to follow labs closely Suspect patient's baseline sodium will be in the 120s  Chronic congestive heart failure with reduced ejection fraction, 25%.-Associated right heart failure.   Lasix decreased to daily but baseline dosage increased from 40 mg to 60 mg given recent treatment for suspected evolving CHF exacerbation-see above regarding sodium and Lasix adjustments-current dose down to 40 mg daily as above Echo May 2021-EF 25-30% Continue Coreg, Entresto, Lasix along with Daily weights with strict I/O- Unable to [employer] due to lower range BP readings Counseled extensively by multiple providers regarding need to adhere to salt restricted diet  Cardiology/Dr. Sharyn Lull recommended cardiac follow-up after discharge and likely will need follow-up echo-if EF remains 30% or less would need to be referred to EP clinic for consideration for ICD implantation  Insomnia Unclear if related to volume overload and patient not perceptive that respiratory symptoms may be contributing-treating volume overload as above Patient requested Lunesta which is nonformulary We will increase Ambien from 5 mg to 10 mg as needed Continue melatonin as well  Depression, Suicidal ideations: Resolved, seen by psychiatry.  Recommended to continue BuSpar, Invega and Seroquel.   Follow-up with outpatient psychiatry after.   Continues with a pattern of increasing anxiety 1 week prior to repeat dosing of Invega which is responsive to prn Klonopin  Seizure disorder: Stable Secondary to hyponatremia and issues of nonadherence with medications prior to admission.   Dilantin level subtherapeutic on 7/18 at 2.6 with dosage adjustments made several times during the admission based on Dilantin level  8/11 Dilantin level therapeutic at 11.7 -continue current dose 8/27 follow-up Dilantin  level 12.9-unless showed signs of toxicity no indication to repeat Dilantin level for at least 6 weeks  Microcytic anemia Iron studies relatively normal, hemoglobin stable If has significant bleeding postoperatively may need to repeat CBC for several days.  OSA Continue nocturnal CPAP-changing to different mask briefly improve CPAP compliance but currently is not wearing consistently  COPD: stable. As needed bronchodilators  Hyperlipidemia: Daily Lipitor 40 mg  Hypokalemia/hypomagnesemia Replace as needed  Peripheral neuropathy Changed gabapentin to Lyrica 50 mg TID and subsequently titrated up to 75 mg Repeat B12 level 401  Modest obesity Estimated body mass index is 31.93 kg/m as calculated from the following:   Height as of this encounter: 5\' 8"  (1.727 m).   Weight as of this encounter: 95.3 kg.      Data Reviewed: Basic Metabolic Panel: Recent Labs  Lab 05/11/20 0731 05/11/20 0731 05/12/20 0343 05/13/20 0418 05/14/20 0906 05/15/20 0956 05/17/20 0313  NA 127*   < > 126* 127* 128* 127* 124*  K 4.7  --  4.3 3.9 4.2  --  4.5  CL 95*  --  93* 94* 96*  --  90*  CO2 21*  --  25 23 24   --  24  GLUCOSE 88  --  98 118* 87  --  97  BUN 7  --  6 5* 5*  --  16  CREATININE 0.52*  --  0.41* 0.55* 0.43*  --  0.64  CALCIUM 9.1  --  9.0 8.9 9.3  --  8.8*   < > = values in this interval not displayed.   Liver Function Tests: No results for input(s): AST, ALT, ALKPHOS, BILITOT, PROT, ALBUMIN in the last 168 hours. No results for input(s): LIPASE, AMYLASE in the last 168 hours. No results for input(s): AMMONIA in the last 168 hours. CBC: No results for input(s): WBC, NEUTROABS, HGB, HCT, MCV, PLT in the last 168 hours. Cardiac Enzymes: No results for input(s): CKTOTAL, CKMB, CKMBINDEX, TROPONINI in the last 168 hours. BNP (last 3 results) Recent Labs    01/22/20 0410 02/24/20 0030 05/08/20 0827  BNP 697.9* 184.7* 48.2    ProBNP (last 3 results) No results  for input(s): PROBNP in the last 8760 hours.  CBG: No results for input(s): GLUCAP in the last 168 hours.  No results found for this or any previous visit (from the past 240 hour(s)).   Studies: No results found.  Scheduled Meds: . atorvastatin  40 mg Oral Daily  . busPIRone  10 mg Oral TID  . carvedilol  3.125 mg Oral BID WC  . chlorhexidine  15 mL Mouth Rinse BID  . digoxin  0.125 mg Oral Daily  . fluticasone furoate-vilanterol  1 puff Inhalation Daily  . furosemide  40 mg Oral Daily  . magnesium oxide  400 mg Oral Daily  . mouth rinse  15 mL Mouth Rinse q12n4p  . phenytoin  300 mg Oral BID  . pregabalin  75 mg Oral TID  . QUEtiapine  200 mg Oral Daily  . sacubitril-valsartan  1 tablet Oral BID   Continuous Infusions:   Principal Problem:   Hyponatremia Active Problems:   Chronic systolic CHF (congestive heart failure) (HCC)   Mixed hyperlipidemia   COPD (chronic obstructive pulmonary disease) (HCC)   Seizure disorder (HCC)   Paranoid schizophrenia (HCC)   Hypokalemia   Hypomagnesemia   Suicidal ideation   Noncompliance with medications  Subtherapeutic serum dilantin level   Seizure [facility])        Consultants:  Dentist/oral surgeon  Procedures:  None  Antibiotics: Anti-infectives (From admission, onward)   Start     Dose/Rate Route Frequency Ordered Stop   05/02/20 1100  amoxicillin-clavulanate (AUGMENTIN) 875-125 MG per tablet 1 tablet  Status:  Discontinued        1 tablet Oral Every 12 hours 05/02/20 1048 05/02/20 1048   05/02/20 1100  amoxicillin-clavulanate (AUGMENTIN) 875-125 MG per tablet 1 tablet        1 tablet Oral Every 12 hours 05/02/20 1048 05/03/20 2143   04/29/20 0800  ceFAZolin (ANCEF) IVPB 2g/100 mL premix        2 g 200 mL/hr over 30 Minutes Intravenous On call to O.R. 04/27/20 1814 04/29/20 0918   04/24/20 2100  Ampicillin-Sulbactam (UNASYN) 3 g in sodium chloride 0.9 % 100 mL IVPB  Status:  Discontinued        3 g 200 mL/hr  over 30 Minutes Intravenous Every 6 hours 04/24/20 1936 05/02/20 1048   04/02/20 2200  amoxicillin-clavulanate (AUGMENTIN) 875-125 MG per tablet 1 tablet        1 tablet Oral Every 12 hours 04/02/20 1405 04/04/20 2130   03/29/20 0930  Ampicillin-Sulbactam (UNASYN) 3 g in sodium chloride 0.9 % 100 mL IVPB  Status:  Discontinued        3 g 200 mL/hr over 30 Minutes Intravenous Every 6 hours 03/29/20 0915 04/02/20 1405       Time spent: 20 minutes    Junious Silk ANP  [facility] Pager [phone]. If 7PM-7AM, please contact night-coverage at [email or web address] 05/17/2020, 10:46 AM  LOS: 84 days

## 2020-05-18 LAB — BASIC METABOLIC PANEL
Anion gap: 8 (ref 5–15)
BUN: 7 mg/dL (ref 6–20)
CO2: 25 mmol/L (ref 22–32)
Calcium: 9.3 mg/dL (ref 8.9–10.3)
Chloride: 97 mmol/L — ABNORMAL LOW (ref 98–111)
Creatinine, Ser: 0.5 mg/dL — ABNORMAL LOW (ref 0.61–1.24)
GFR calc Af Amer: >60 mL/min (ref >60)
GFR calc non Af Amer: >60 mL/min (ref >60)
Glucose, Bld: 90 mg/dL (ref 70–99)
Potassium: 4.6 mmol/L (ref 3.5–5.1)
Sodium: 130 mmol/L — ABNORMAL LOW (ref 135–145)

## 2020-05-18 NOTE — Progress Notes (Signed)
[facility] PROGRESS NOTE  Jim Dunn YCX:[id number] DOB: [birth year] DOA: 02/23/2020 PCP: System, Pcp Not In  Status: Inpatient---Remains inpatient appropriate because:Altered mental status and Unsafe d/c plan   Dispo: The patient is from: Home              Anticipated d/c is to: Group home              Anticipated d/c date is: > 3 days              Patient currently is not medically stable to d/c. **NEEDS SAFE DC PLAN** Currently awaiting placement, TOC team working on this.  Mother is currently working to obtain Medicaid/guardianship. Unsafe for discharge based on underlying schizophrenia and history of recurrent noncompliance which led to patient's current hospitalization  Discussed with LCSW/case management. Mother presented to hospital on 8/17 to work with patient regarding Medicaid application.  She also obtained a completed FL2 form which was requested by [facility].  **As of 8/26 TOC has received information from group home outside of [facility] that they are coming the week of 8/30 but unfortunately visit did not occur  Code Status: Full Family Communication: 8/26 updated [name] bedside [ [phone] or [phone]] DVT prophylaxis: 8/5 refused Lovenox therefore switched to SCDs since ambulating without difficulty Vaccination status: Vaccinated   HPI (time of admission): 54 year old male with past medical history of schizophrenia, seizure disorder, biventricular heart failure(Echo 01/2020 EF 25-30%with right-sided heart failure),severe tricuspid regurgitation, nicotine dependence and COPD who presents to [facility] emergency department with suicidal ideation and seizure.Patient explains that since he left the hospital several days ago(admitted6/17-6/19-was being managed for hyponatremia, thought to be multifactorial in origin. Patient left AMA on6/19 and that time had sodium of 132),he has been experiencing increasing fatigue. This  increasing fatigue is associated with bilateral lower extremity tingling. Symptomsweresimilar to what prompted his presentation on 6/17. Additionally, he reported feeling depressed withsuicidal ideation, withouta plan. Patient reportedthat he hadattempted suicide in the distant past using pills but was unsuccessful and never sought medical attention. While the patient was waiting in the waiting room, he experienced a witnessed seizure while attempting to walk to the bathroom. Patient was immediately brought back to the emergency department for further evaluation. Patient was found to have substantial recurrent hyponatremia of 117 with concurrent hypokalemia of 2.6 and hypomagnesium of 1.4. Patient was administered 1236mg  of intravenous Fosphenytoinand initiated on low rate normal saline infusion. Patient was administered 2 g of intravenous magnesium and a 30 mEq of potassium.  Patient's electrolytes have improved, no further seizure episodes at this time.  Echo showed EF of 25-30%, cardiology recommended outpatient follow-up but otherwise continue Coreg, torsemide and Entresto.    Subjective: Sitting up in bed. States she urinated quite a bit yesterday. Has not been urinating in urinal and saving for nurse so has been instructed to do so. No other complaints verbalized.  Objective: Vitals:   05/18/20 0939 05/18/20 1149  BP: (!) 157/81 109/65  Pulse: 69 89  Resp:  18  Temp:  98.1 F (36.7 C)  SpO2:  99%    Intake/Output Summary (Last 24 hours) at 05/18/2020 1231 Last data filed at 05/18/2020 0646 Gross per 24 hour  Intake 480 ml  Output --  Net 480 ml   Filed Weights   05/12/20 0727 05/13/20 0659 05/15/20 0635  Weight: 98 kg 98.4 kg 95.3 kg    Exam: Constitutional: NAD, mildly anxious but reports is significantly improved as  compared to overnight, comfortable Oral: No bleeding from mouth.  Swelling has nearly resolved and speech more clear as edema has decreased.  Tolerating  diet. Respiratory: clear to auscultation bilaterally on posterior exam, Normal respiratory effort at rest.  Room air Cardiovascular: Regular rate, no murmurs / rubs / gallops. No lower extremity edema.  No JVD.  Abdomen: no tenderness, Bowel sounds positive.  Increasing abdominal girth Neurologic: CN 2-12 grossly intact. Sensation intact,Strength 5/5 x all 4 extremities. Ambulates independently Psychiatric: Alert and oriented x 3 although does not have appropriate insight or capacity to manage care independently   Assessment/Plan: Recurrent dental abscesses status post full dental extraction on 8/26 Recurrent.  Initial episode in July that responded to IV antibiotics recurrent episode in August.  Both with confirmed dental abscess orthopantogram in both episodes responsive to IV Unasyn. Status post full dental extraction on 8/26.  Dental surgeon recommends   follow-up with dental medicine for evaluation of healing and suture removal once discharged.  Hyponatremia with seizures/multifactorial etiology: 1. Intravascular dehydration, resolved 2.  SIADH 3.  Chronic systolic heart failure intermittent volume overload At time of admission diuretics torsemide & metolazone were held and subsequently  discontinued.   Weight had gone up and suspected evolving heart failure therefore Lasix was increased to BID dosing x 3 days and transitioned to 60 mg daily.   9/2 sodium down to 126-weight up slightly but does not appear consistent with heart failure therefore have decreased Lasix back to 40 mg daily Week of 9/4: Due to persistent hyponatremia covering attending physician obtained SIADH labs which confirmed SIADH physiology.  Lasix was discontinued.  Placed on fluid restriction which patient has been inconsistent in following -sodium subsequently decreased despite FR.  Weight has also increased. 9/13 given increasing weight and decreasing sodium over 24 hours despite adhering to 1200 cc FR have opted to  resume low-dose Lasix 40 mg daily 9/14 after resuming Lasix sodium has increased to 130 Suspect patient's baseline sodium will be in the 120s  Chronic congestive heart failure with reduced ejection fraction, 25%.-Associated right heart failure.   Lasix decreased to daily but baseline dosage increased from 40 mg to 60 mg given recent treatment for suspected evolving CHF exacerbation-see above regarding sodium and Lasix adjustments-current dose down to 40 mg daily as above Echo May 2021-EF 25-30% Continue Coreg, Entresto, Lasix along with Daily weights with strict I/O size to patient and to nursing staff- Unable to [employer] due to lower range BP readings Counseled extensively by multiple providers regarding need to adhere to salt restricted diet  Cardiology/Dr. Sharyn Lull recommended cardiac follow-up after discharge and likely will need follow-up echo-if EF remains 30% or less would need to be referred to EP clinic for consideration for ICD implantation  Insomnia Unclear if related to volume overload and patient not perceptive that respiratory symptoms may be contributing-treating volume overload as above Patient requested Lunesta which is nonformulary We will increase Ambien from 5 mg to 10 mg as needed Continue melatonin as well  Depression, Suicidal ideations: Resolved, seen by psychiatry.  Recommended to continue BuSpar, Invega and Seroquel.   Follow-up with outpatient psychiatry after.   Continues with a pattern of increasing anxiety 1 week prior to repeat dosing of Invega which is responsive to prn Klonopin  Seizure disorder: Stable Secondary to hyponatremia and issues of nonadherence with medications prior to admission.   Dilantin level subtherapeutic on 7/18 at 2.6 with dosage adjustments made several times during the admission based on Dilantin level  8/11 Dilantin level therapeutic at 11.7 -continue current dose 8/27 follow-up Dilantin level 12.9-unless showed signs of  toxicity no indication to repeat Dilantin level for at least 6 weeks  Microcytic anemia Iron studies relatively normal, hemoglobin stable If has significant bleeding postoperatively may need to repeat CBC for several days.  OSA Continue nocturnal CPAP-changing to different mask briefly improve CPAP compliance but currently is not wearing consistently  COPD: stable. As needed bronchodilators  Hyperlipidemia: Daily Lipitor 40 mg  Hypokalemia/hypomagnesemia Replace as needed  Peripheral neuropathy Changed gabapentin to Lyrica 50 mg TID and subsequently titrated up to 75 mg Repeat B12 level 401  Modest obesity Estimated body mass index is 31.93 kg/m as calculated from the following:   Height as of this encounter: 5\' 8"  (1.727 m).   Weight as of this encounter: 95.3 kg.      Data Reviewed: Basic Metabolic Panel: Recent Labs  Lab 05/12/20 0343 05/12/20 0343 05/13/20 0418 05/14/20 0906 05/15/20 0956 05/17/20 0313 05/18/20 0400  NA 126*   < > 127* 128* 127* 124* 130*  K 4.3  --  3.9 4.2  --  4.5 4.6  CL 93*  --  94* 96*  --  90* 97*  CO2 25  --  23 24  --  24 25  GLUCOSE 98  --  118* 87  --  97 90  BUN 6  --  5* 5*  --  16 7  CREATININE 0.41*  --  0.55* 0.43*  --  0.64 0.50*  CALCIUM 9.0  --  8.9 9.3  --  8.8* 9.3   < > = values in this interval not displayed.   Liver Function Tests: No results for input(s): AST, ALT, ALKPHOS, BILITOT, PROT, ALBUMIN in the last 168 hours. No results for input(s): LIPASE, AMYLASE in the last 168 hours. No results for input(s): AMMONIA in the last 168 hours. CBC: No results for input(s): WBC, NEUTROABS, HGB, HCT, MCV, PLT in the last 168 hours. Cardiac Enzymes: No results for input(s): CKTOTAL, CKMB, CKMBINDEX, TROPONINI in the last 168 hours. BNP (last 3 results) Recent Labs    01/22/20 0410 02/24/20 0030 05/08/20 0827  BNP 697.9* 184.7* 48.2    ProBNP (last 3 results) No results for input(s): PROBNP in the last  8760 hours.  CBG: No results for input(s): GLUCAP in the last 168 hours.  No results found for this or any previous visit (from the past 240 hour(s)).   Studies: No results found.  Scheduled Meds: . atorvastatin  40 mg Oral Daily  . busPIRone  10 mg Oral TID  . carvedilol  3.125 mg Oral BID WC  . chlorhexidine  15 mL Mouth Rinse BID  . digoxin  0.125 mg Oral Daily  . fluticasone furoate-vilanterol  1 puff Inhalation Daily  . furosemide  40 mg Oral Daily  . magnesium oxide  400 mg Oral Daily  . mouth rinse  15 mL Mouth Rinse q12n4p  . phenytoin  300 mg Oral BID  . pregabalin  75 mg Oral TID  . QUEtiapine  200 mg Oral Daily  . sacubitril-valsartan  1 tablet Oral BID   Continuous Infusions:   Principal Problem:   Hyponatremia Active Problems:   Chronic systolic CHF (congestive heart failure) (HCC)   Mixed hyperlipidemia   COPD (chronic obstructive pulmonary disease) (HCC)   Seizure disorder (HCC)   Paranoid schizophrenia (HCC)   Hypokalemia   Hypomagnesemia   Suicidal ideation   Noncompliance with medications  Subtherapeutic serum dilantin level   Seizure [facility])        Consultants:  Dentist/oral surgeon  Procedures:  None  Antibiotics: Anti-infectives (From admission, onward)   Start     Dose/Rate Route Frequency Ordered Stop   05/02/20 1100  amoxicillin-clavulanate (AUGMENTIN) 875-125 MG per tablet 1 tablet  Status:  Discontinued        1 tablet Oral Every 12 hours 05/02/20 1048 05/02/20 1048   05/02/20 1100  amoxicillin-clavulanate (AUGMENTIN) 875-125 MG per tablet 1 tablet        1 tablet Oral Every 12 hours 05/02/20 1048 05/03/20 2143   04/29/20 0800  ceFAZolin (ANCEF) IVPB 2g/100 mL premix        2 g 200 mL/hr over 30 Minutes Intravenous On call to O.R. 04/27/20 1814 04/29/20 0918   04/24/20 2100  Ampicillin-Sulbactam (UNASYN) 3 g in sodium chloride 0.9 % 100 mL IVPB  Status:  Discontinued        3 g 200 mL/hr over 30 Minutes Intravenous Every 6  hours 04/24/20 1936 05/02/20 1048   04/02/20 2200  amoxicillin-clavulanate (AUGMENTIN) 875-125 MG per tablet 1 tablet        1 tablet Oral Every 12 hours 04/02/20 1405 04/04/20 2130   03/29/20 0930  Ampicillin-Sulbactam (UNASYN) 3 g in sodium chloride 0.9 % 100 mL IVPB  Status:  Discontinued        3 g 200 mL/hr over 30 Minutes Intravenous Every 6 hours 03/29/20 0915 04/02/20 1405       Time spent: 20 minutes    Junious Silk ANP  [facility] Pager [phone]. If 7PM-7AM, please contact night-coverage at [email or web address] 05/18/2020, 12:31 PM  LOS: 85 days

## 2020-05-19 LAB — BASIC METABOLIC PANEL
Anion gap: 9 (ref 5–15)
BUN: 11 mg/dL (ref 6–20)
CO2: 23 mmol/L (ref 22–32)
Calcium: 9.4 mg/dL (ref 8.9–10.3)
Chloride: 97 mmol/L — ABNORMAL LOW (ref 98–111)
Creatinine, Ser: 0.49 mg/dL — ABNORMAL LOW (ref 0.61–1.24)
GFR calc Af Amer: >60 mL/min (ref >60)
GFR calc non Af Amer: >60 mL/min (ref >60)
Glucose, Bld: 99 mg/dL (ref 70–99)
Potassium: 4.3 mmol/L (ref 3.5–5.1)
Sodium: 129 mmol/L — ABNORMAL LOW (ref 135–145)

## 2020-05-19 NOTE — Progress Notes (Signed)
[facility] PROGRESS NOTE  Jim Dunn ZOX:[id number] DOB: [birth year] DOA: 02/23/2020 PCP: Pcp, No  Status: Inpatient---Remains inpatient appropriate because:Altered mental status and Unsafe d/c plan   Dispo: The patient is from: Home              Anticipated d/c is to: Group home              Anticipated d/c date is: > 3 days              Patient currently is not medically stable to d/c. **NEEDS SAFE DC PLAN** Currently awaiting placement, TOC team working on this.  Mother is currently working to obtain Medicaid/guardianship. Unsafe for discharge based on underlying schizophrenia and history of recurrent noncompliance which led to patient's current hospitalization  Discussed with LCSW/case management. Mother presented to hospital on 8/17 to work with patient regarding Medicaid application.  She also obtained a completed FL2 form which was requested by [facility].  **As of 8/26 TOC has received information from group home outside of [facility] that they are coming the week of 8/30 but unfortunately visit did not occur  Code Status: Full Family Communication: 9/15 updated [name] [ [phone] or [phone]] DVT prophylaxis: 8/5 refused Lovenox therefore switched to SCDs since ambulating without difficulty Vaccination status: Vaccinated   HPI (time of admission): 54 year old male with past medical history of schizophrenia, seizure disorder, biventricular heart failure(Echo 01/2020 EF 25-30%with right-sided heart failure),severe tricuspid regurgitation, nicotine dependence and COPD who presents to [facility] emergency department with suicidal ideation and seizure.Patient explains that since he left the hospital several days ago(admitted6/17-6/19-was being managed for hyponatremia, thought to be multifactorial in origin. Patient left AMA on6/19 and that time had sodium of 132),he has been experiencing increasing fatigue. This increasing fatigue  is associated with bilateral lower extremity tingling. Symptomsweresimilar to what prompted his presentation on 6/17. Additionally, he reported feeling depressed withsuicidal ideation, withouta plan. Patient reportedthat he hadattempted suicide in the distant past using pills but was unsuccessful and never sought medical attention. While the patient was waiting in the waiting room, he experienced a witnessed seizure while attempting to walk to the bathroom. Patient was immediately brought back to the emergency department for further evaluation. Patient was found to have substantial recurrent hyponatremia of 117 with concurrent hypokalemia of 2.6 and hypomagnesium of 1.4. Patient was administered 1236mg  of intravenous Fosphenytoinand initiated on low rate normal saline infusion. Patient was administered 2 g of intravenous magnesium and a 30 mEq of potassium.  Patient's electrolytes have improved, no further seizure episodes at this time.  Echo showed EF of 25-30%, cardiology recommended outpatient follow-up but otherwise continue Coreg, torsemide and Entresto.    Subjective: Alert.  Eating in bed.  Expressing frustration over lack of progression and discharge disposition and location.  Objective: Vitals:   05/19/20 0334 05/19/20 0755  BP: 140/81 140/81  Pulse: 64 80  Resp: 20 18  Temp: 97.9 F (36.6 C) 97.9 F (36.6 C)  SpO2: 100% 100%    Intake/Output Summary (Last 24 hours) at 05/19/2020 1136 Last data filed at 05/18/2020 2145 Gross per 24 hour  Intake 240 ml  Output --  Net 240 ml   Filed Weights   05/13/20 0659 05/15/20 0635 05/19/20 0334  Weight: 98.4 kg 95.3 kg 98.5 kg    Exam: Constitutional: NAD, mildly anxious but reports is significantly improved as compared to overnight, comfortable Respiratory: clear to auscultation bilaterally on posterior exam, Normal respiratory effort at  rest.  Room air Cardiovascular: Regular rate, no murmurs / rubs / gallops. No lower  extremity edema.  No JVD.  Abdomen: no tenderness, Bowel sounds positive.  Increasing abdominal girth Neurologic: CN 2-12 grossly intact. Sensation intact,Strength 5/5 x all 4 extremities. Ambulates independently Psychiatric: Alert and oriented x 3 although does not have appropriate insight or capacity to manage care independently   Assessment/Plan: Recurrent dental abscesses status post full dental extraction on 8/26 Recurrent.  Initial episode in July that responded to IV antibiotics recurrent episode in August.  Both with confirmed dental abscess orthopantogram in both episodes responsive to IV Unasyn. Status post full dental extraction on 8/26.  Dental surgeon recommends   follow-up with dental medicine for evaluation of healing and suture removal once discharged.  Hyponatremia with seizures/multifactorial etiology: 1. Intravascular dehydration, resolved 2.  SIADH 3.  Chronic systolic heart failure intermittent volume overload At time of admission diuretics torsemide & metolazone were held and subsequently  discontinued.   Weight had gone up and suspected evolving heart failure therefore Lasix was increased to BID dosing x 3 days and transitioned to 60 mg daily.   9/2 sodium down to 126-weight up slightly but does not appear consistent with heart failure therefore have decreased Lasix back to 40 mg daily Week of 9/4: Due to persistent hyponatremia covering attending physician obtained SIADH labs which confirmed SIADH physiology.  Lasix was discontinued.  Placed on fluid restriction which patient has been inconsistent in following -sodium subsequently decreased despite FR.  Weight has also increased. 9/13 given increasing weight and decreasing sodium over 24 hours despite adhering to 1200 cc FR have opted to resume low-dose Lasix 40 mg daily 9/14 after resuming Lasix sodium has increased to 130 and as of 9/15 was 129 Suspect patient's baseline sodium will be in the 120s  Chronic congestive  heart failure with reduced ejection fraction, 25%.-Associated right heart failure.   Lasix decreased to daily but baseline dosage increased from 40 mg to 60 mg given recent treatment for suspected evolving CHF exacerbation-see above regarding sodium and Lasix adjustments-current dose down to 40 mg daily as above Echo May 2021-EF 25-30% Continue Coreg, Entresto, Lasix along with Daily weights with strict I/O size to patient and to nursing staff- Unable to [employer] due to lower range BP readings Counseled extensively by multiple providers regarding need to adhere to salt restricted diet  Cardiology/Dr. Sharyn Lull recommended cardiac follow-up after discharge and likely will need follow-up echo-if EF remains 30% or less would need to be referred to EP clinic for consideration for ICD implantation  Insomnia Unclear if related to volume overload and patient not perceptive that respiratory symptoms may be contributing-treating volume overload as above Patient requested Lunesta which is nonformulary We will increase Ambien from 5 mg to 10 mg as needed Continue melatonin as well  Depression, Suicidal ideations: Resolved, seen by psychiatry.  Recommended to continue BuSpar, Invega and Seroquel.   Follow-up with outpatient psychiatry after.   Continues with a pattern of increasing anxiety 1 week prior to repeat dosing of Invega which is responsive to prn Klonopin  Seizure disorder: Stable Secondary to hyponatremia and issues of nonadherence with medications prior to admission.   Dilantin level subtherapeutic on 7/18 at 2.6 with dosage adjustments made several times during the admission based on Dilantin level 8/11 Dilantin level therapeutic at 11.7 -continue current dose 8/27 follow-up Dilantin level 12.9-unless showed signs of toxicity no indication to repeat Dilantin level for at least 6 weeks  Microcytic  anemia Iron studies relatively normal, hemoglobin stable If has significant  bleeding postoperatively may need to repeat CBC for several days.  OSA Continue nocturnal CPAP-changing to different mask briefly improve CPAP compliance but currently is not wearing consistently  COPD: stable. As needed bronchodilators  Hyperlipidemia: Daily Lipitor 40 mg  Hypokalemia/hypomagnesemia Replace as needed  Peripheral neuropathy Changed gabapentin to Lyrica 50 mg TID and subsequently titrated up to 75 mg Repeat B12 level 401  Modest obesity Estimated body mass index is 33.03 kg/m as calculated from the following:   Height as of this encounter: 5\' 8"  (1.727 m).   Weight as of this encounter: 98.5 kg.      Data Reviewed: Basic Metabolic Panel: Recent Labs  Lab 05/13/20 0418 05/13/20 0418 05/14/20 0906 05/15/20 0956 05/17/20 0313 05/18/20 0400 05/19/20 0210  NA 127*   < > 128* 127* 124* 130* 129*  K 3.9  --  4.2  --  4.5 4.6 4.3  CL 94*  --  96*  --  90* 97* 97*  CO2 23  --  24  --  24 25 23   GLUCOSE 118*  --  87  --  97 90 99  BUN 5*  --  5*  --  16 7 11   CREATININE 0.55*  --  0.43*  --  0.64 0.50* 0.49*  CALCIUM 8.9  --  9.3  --  8.8* 9.3 9.4   < > = values in this interval not displayed.   Liver Function Tests: No results for input(s): AST, ALT, ALKPHOS, BILITOT, PROT, ALBUMIN in the last 168 hours. No results for input(s): LIPASE, AMYLASE in the last 168 hours. No results for input(s): AMMONIA in the last 168 hours. CBC: No results for input(s): WBC, NEUTROABS, HGB, HCT, MCV, PLT in the last 168 hours. Cardiac Enzymes: No results for input(s): CKTOTAL, CKMB, CKMBINDEX, TROPONINI in the last 168 hours. BNP (last 3 results) Recent Labs    01/22/20 0410 02/24/20 0030 05/08/20 0827  BNP 697.9* 184.7* 48.2    ProBNP (last 3 results) No results for input(s): PROBNP in the last 8760 hours.  CBG: No results for input(s): GLUCAP in the last 168 hours.  No results found for this or any previous visit (from the past 240 hour(s)).    Studies: No results found.  Scheduled Meds: . atorvastatin  40 mg Oral Daily  . busPIRone  10 mg Oral TID  . carvedilol  3.125 mg Oral BID WC  . chlorhexidine  15 mL Mouth Rinse BID  . digoxin  0.125 mg Oral Daily  . fluticasone furoate-vilanterol  1 puff Inhalation Daily  . furosemide  40 mg Oral Daily  . magnesium oxide  400 mg Oral Daily  . mouth rinse  15 mL Mouth Rinse q12n4p  . phenytoin  300 mg Oral BID  . pregabalin  75 mg Oral TID  . QUEtiapine  200 mg Oral Daily  . sacubitril-valsartan  1 tablet Oral BID   Continuous Infusions:   Principal Problem:   Hyponatremia Active Problems:   Chronic systolic CHF (congestive heart failure) (HCC)   Mixed hyperlipidemia   COPD (chronic obstructive pulmonary disease) (HCC)   Seizure disorder (HCC)   Paranoid schizophrenia (HCC)   Hypokalemia   Hypomagnesemia   Suicidal ideation   Noncompliance with medications   Subtherapeutic serum dilantin level   Seizure [facility])        Consultants:  Dentist/oral surgeon  Procedures:  None  Antibiotics: Anti-infectives (From admission, onward)  Start     Dose/Rate Route Frequency Ordered Stop   05/02/20 1100  amoxicillin-clavulanate (AUGMENTIN) 875-125 MG per tablet 1 tablet  Status:  Discontinued        1 tablet Oral Every 12 hours 05/02/20 1048 05/02/20 1048   05/02/20 1100  amoxicillin-clavulanate (AUGMENTIN) 875-125 MG per tablet 1 tablet        1 tablet Oral Every 12 hours 05/02/20 1048 05/03/20 2143   04/29/20 0800  ceFAZolin (ANCEF) IVPB 2g/100 mL premix        2 g 200 mL/hr over 30 Minutes Intravenous On call to O.R. 04/27/20 1814 04/29/20 0918   04/24/20 2100  Ampicillin-Sulbactam (UNASYN) 3 g in sodium chloride 0.9 % 100 mL IVPB  Status:  Discontinued        3 g 200 mL/hr over 30 Minutes Intravenous Every 6 hours 04/24/20 1936 05/02/20 1048   04/02/20 2200  amoxicillin-clavulanate (AUGMENTIN) 875-125 MG per tablet 1 tablet        1 tablet Oral Every 12 hours  04/02/20 1405 04/04/20 2130   03/29/20 0930  Ampicillin-Sulbactam (UNASYN) 3 g in sodium chloride 0.9 % 100 mL IVPB  Status:  Discontinued        3 g 200 mL/hr over 30 Minutes Intravenous Every 6 hours 03/29/20 0915 04/02/20 1405       Time spent: 15 minutes    Junious Silk ANP  [facility] Pager [phone]. If 7PM-7AM, please contact night-coverage at [email or web address] 05/19/2020, 11:36 AM  LOS: 86 days

## 2020-05-19 NOTE — Progress Notes (Signed)
Pt refused cpap for the night.  

## 2020-05-20 LAB — BASIC METABOLIC PANEL
Anion gap: 9 (ref 5–15)
BUN: 8 mg/dL (ref 6–20)
CO2: 26 mmol/L (ref 22–32)
Calcium: 9.5 mg/dL (ref 8.9–10.3)
Chloride: 93 mmol/L — ABNORMAL LOW (ref 98–111)
Creatinine, Ser: 0.6 mg/dL — ABNORMAL LOW (ref 0.61–1.24)
GFR calc Af Amer: >60 mL/min (ref >60)
GFR calc non Af Amer: >60 mL/min (ref >60)
Glucose, Bld: 93 mg/dL (ref 70–99)
Potassium: 4.1 mmol/L (ref 3.5–5.1)
Sodium: 128 mmol/L — ABNORMAL LOW (ref 135–145)
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
## 2020-05-21 NOTE — Progress Notes (Deleted)
Nutrition Education Note  RD consulted for nutrition education regarding a Heart Healthy diet.   Lipid Panel  No results found for: CHOL, TRIG, HDL, CHOLHDL, VLDL, LDLCALC  RD provided "Heart Healthy Nutrition Therapy" handout from the Academy of Nutrition and Dietetics. Reviewed patient's dietary recall. Provided examples on ways to decrease sodium and fat intake in diet. Discouraged intake of processed foods and use of salt shaker. Encouraged fresh fruits and vegetables as well as whole grain sources of carbohydrates to maximize fiber intake. Teach back method used.  Expect fair compliance.  Body mass index is 33.24 kg/m. Pt meets criteria for obesity based on current BMI.  Current diet order is Dysphagia 3, patient is consuming approximately 100% of meals at this time. Labs and medications reviewed. No further nutrition interventions warranted at this time. RD contact information provided. If additional nutrition issues arise, please re-consult RD.  Eugene Gavia, MS, RD, LDN RD pager number and weekend/on-call pager number located in [place].
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
## 2020-05-21 NOTE — Plan of Care (Signed)
Nutrition Education Note  RD consulted for nutrition education regarding a Heart Healthy diet.   Lipid Panel  No results found for: CHOL, TRIG, HDL, CHOLHDL, VLDL, LDLCALC  RD provided "Heart Healthy Nutrition Therapy" handout from the Academy of Nutrition and Dietetics. Reviewed patient's dietary recall. Provided examples on ways to decrease sodium and fat intake in diet. Discouraged intake of processed foods and use of salt shaker. Encouraged fresh fruits and vegetables as well as whole grain sources of carbohydrates to maximize fiber intake. Teach back method used.  Expect fair compliance.  Body mass index is 33.24 kg/m. Pt meets criteria for obesity based on current BMI.  Current diet order is Dysphagia 3, patient is consuming approximately 100% of meals at this time. Labs and medications reviewed. No further nutrition interventions warranted at this time. RD contact information provided. If additional nutrition issues arise, please re-consult RD.  Jim Ruggiero, MS, RD, LDN RD pager number and weekend/on-call pager number located in [place].   

## 2020-05-21 NOTE — Progress Notes (Addendum)
[facility] PROGRESS NOTE  Gale Hulse OJJ:[id number] DOB: [birth year] DOA: 02/23/2020 PCP: Pcp, No  Status: Inpatient---Remains inpatient appropriate because:Altered mental status and Unsafe d/c plan   Dispo: The patient is from: Home              Anticipated d/c is to: ALF               Anticipated d/c date is: > 3 days              Patient currently is not medically stable to d/c. **NEEDS SAFE DC PLAN** Currently awaiting placement, TOC team working on this.  Mother is currently working to obtain Medicaid/guardianship. Unsafe for discharge based on underlying schizophrenia and history of recurrent noncompliance which led to patient's current hospitalization.  As of 9/17 given patient's normal MMSE  But abnormal SBT a psychiatric reevaluation has been requested to determine if patient now appropriate to discharge to independent living in a local apartment.  Unfortunately given patient does not have an associated developmental disability diagnosis along with his schizophrenia he is not eligible for group home placement.  Since he does not have an associated diagnosis of dementia his Medicaid/Medicare would not pay for ALF placement.   Code Status: Full Family Communication: 9/15 updated [name] [ [phone] or [phone]] DVT prophylaxis: 8/5 refused Lovenox therefore switched to SCDs since ambulating without difficulty Vaccination status: Vaccinated   HPI (time of admission): 54 year old male with past medical history of schizophrenia, seizure disorder, biventricular heart failure(Echo 01/2020 EF 25-30%with right-sided heart failure),severe tricuspid regurgitation, nicotine dependence and COPD who presents to [facility] emergency department with suicidal ideation and seizure.Patient explains that since he left the hospital several days ago(admitted6/17-6/19-was being managed for hyponatremia, thought to be multifactorial in origin. Patient left AMA  on6/19 and that time had sodium of 132),he has been experiencing increasing fatigue. This increasing fatigue is associated with bilateral lower extremity tingling. Symptomsweresimilar to what prompted his presentation on 6/17. Additionally, he reported feeling depressed withsuicidal ideation, withouta plan. Patient reportedthat he hadattempted suicide in the distant past using pills but was unsuccessful and never sought medical attention. While the patient was waiting in the waiting room, he experienced a witnessed seizure while attempting to walk to the bathroom. Patient was immediately brought back to the emergency department for further evaluation. Patient was found to have substantial recurrent hyponatremia of 117 with concurrent hypokalemia of 2.6 and hypomagnesium of 1.4. Patient was administered 1236mg  of intravenous Fosphenytoinand initiated on low rate normal saline infusion. Patient was administered 2 g of intravenous magnesium and a 30 mEq of potassium.  Patient's electrolytes have improved, no further seizure episodes at this time.  Echo showed EF of 25-30%, cardiology recommended outpatient follow-up but otherwise continue Coreg, torsemide and Entresto.    Subjective: Alert.  Updated on need to change discharge plan and disposition.  Patient request once we have more details to contact his mother.  Objective: Vitals:   05/21/20 0727 05/21/20 0753  BP: 116/67   Pulse: 81 84  Resp: 18 18  Temp: 97.6 F (36.4 C)   SpO2: 100% 99%    Intake/Output Summary (Last 24 hours) at 05/21/2020 1210 Last data filed at 05/21/2020 0900 Gross per 24 hour  Intake 600 ml  Output --  Net 600 ml   Filed Weights   05/15/20 0635 05/19/20 0334 05/20/20 0800  Weight: 95.3 kg 98.5 kg 99.2 kg    Exam: Constitutional: NAD, mildly anxious but reports is  significantly improved as compared to overnight, comfortable Respiratory: clear to auscultation bilaterally on posterior exam, Normal  respiratory effort at rest.  Room air Cardiovascular: Regular rate, no murmurs / rubs / gallops. No lower extremity edema.  No JVD.  Abdomen: no tenderness, Bowel sounds positive.  Increasing abdominal girth Neurologic: CN 2-12 grossly intact. Sensation intact,Strength 5/5 x all 4 extremities. Ambulates independently Psychiatric: Alert and oriented x name and place although does not have appropriate insight or capacity to manage care independently   Assessment/Plan: Recurrent dental abscesses status post full dental extraction on 8/26 Recurrent.  Initial episode in July that responded to IV antibiotics recurrent episode in August.  Both with confirmed dental abscess orthopantogram in both episodes responsive to IV Unasyn. Status post full dental extraction on 8/26.  Dental surgeon recommends   follow-up with dental medicine for evaluation of healing and suture removal once discharged.  Hyponatremia with seizures/multifactorial etiology: 1. Intravascular dehydration, resolved 2.  SIADH 3.  Chronic systolic heart failure intermittent volume overload At time of admission diuretics torsemide & metolazone were held and subsequently  discontinued.   Weight had gone up and suspected evolving heart failure therefore Lasix was increased to BID dosing x 3 days and transitioned to 60 mg daily.   9/2 sodium down to 126-weight up slightly but does not appear consistent with heart failure therefore have decreased Lasix back to 40 mg daily Week of 9/4: Due to persistent hyponatremia covering attending physician obtained SIADH labs which confirmed SIADH physiology.  Lasix was discontinued.  Placed on fluid restriction which patient has been inconsistent in following -sodium subsequently decreased despite FR.  Weight has also increased. 9/13 given increasing weight and decreasing sodium over 24 hours despite adhering to 1200 cc FR have opted to resume low-dose Lasix 40 mg daily 9/14 after resuming Lasix sodium  has increased to 130 and as of 9/15 was 129 Suspect patient's baseline sodium will be in the 120s  Chronic congestive heart failure with reduced ejection fraction, 25%.-Associated right heart failure.   Lasix decreased to daily but baseline dosage increased from 40 mg to 60 mg given recent treatment for suspected evolving CHF exacerbation-see above regarding sodium and Lasix adjustments-current dose down to 40 mg daily as above Echo May 2021-EF 25-30% Continue Coreg, Entresto, Lasix along with Daily weights with strict I/O size to patient and to nursing staff- Unable to [employer] due to lower range BP readings Counseled extensively by multiple providers regarding need to adhere to salt restricted diet  Cardiology/Dr. Sharyn Lull recommended cardiac follow-up after discharge and likely will need follow-up echo-if EF remains 30% or less would need to be referred to EP clinic for consideration for ICD implantation **9/17 asked staff to begin educating patient regarding management of CHF.  Area request is to get patient to participate in monitoring tasks such as obtaining daily weight and writing down; in addition I have asked for dietitian to come by and provide written educational materials regarding heart failure diet.**  Insomnia Unclear if related to volume overload and patient not perceptive that respiratory symptoms may be contributing-treating volume overload as above Patient requested Lunesta which is nonformulary We will increase Ambien from 5 mg to 10 mg as needed Continue melatonin as well  Depression/paranoid schizophrenia, Suicidal ideations--Functional Dementia: Resolved, seen by psychiatry early during the hospitalization.  Recommended to continue BuSpar, Invega and Seroquel.   Follow-up with outpatient psychiatry after.   Continues with a pattern of increasing anxiety 1 week prior to repeat dosing  of Invega which is responsive to prn Klonopin  Scored very well on MMSE testing  (30) but scored poorly on Short Blessed Test which is consistent w/ functional short term memory deficits c/w a dementia diagnosis. HAVE ASKED PSYCH FOR INPUT also (see below)  " 9/17 OT administered the SBT. He scored an 8 placing him in "questional impairment." His main deficits were in memory and problem solving. I feel he will have difficulty with IADLs including medication management, bill paying, grocery shopping, etc. Apparently (according to Mr. Knaak and his mother), his roommate did these things for him PTA. I will write my note soon and have the numbers in for you. He will need assistance for IADLs."  As noted above given lack of concomitant developmental delay diagnosis he is in eligible for group home placement   Seizure disorder: Stable Secondary to hyponatremia and issues of nonadherence with medications prior to admission.   Dilantin level subtherapeutic on 7/18 at 2.6 with dosage adjustments made several times during the admission based on Dilantin level 8/11 Dilantin level therapeutic at 11.7 -continue current dose 8/27 follow-up Dilantin level 12.9-unless showed signs of toxicity no indication to repeat Dilantin level for at least 6 weeks  Microcytic anemia Iron studies relatively normal, hemoglobin stable If has significant bleeding postoperatively may need to repeat CBC for several days.  OSA Continue nocturnal CPAP-changing to different mask briefly improve CPAP compliance but currently is not wearing consistently  COPD: stable. As needed bronchodilators  Hyperlipidemia: Daily Lipitor 40 mg  Hypokalemia/hypomagnesemia Replace as needed  Peripheral neuropathy Changed gabapentin to Lyrica 50 mg TID and subsequently titrated up to 75 mg Repeat B12 level 401  Modest obesity Estimated body mass index is 33.24 kg/m as calculated from the following:   Height as of this encounter: 5\' 8"  (1.727 m).   Weight as of this encounter: 99.2 kg.      Data  Reviewed: Basic Metabolic Panel: Recent Labs  Lab 05/15/20 0956 05/17/20 0313 05/18/20 0400 05/19/20 0210 05/20/20 0946  NA 127* 124* 130* 129* 128*  K  --  4.5 4.6 4.3 4.1  CL  --  90* 97* 97* 93*  CO2  --  24 25 23 26   GLUCOSE  --  97 90 99 93  BUN  --  16 7 11 8   CREATININE  --  0.64 0.50* 0.49* 0.60*  CALCIUM  --  8.8* 9.3 9.4 9.5   Liver Function Tests: No results for input(s): AST, ALT, ALKPHOS, BILITOT, PROT, ALBUMIN in the last 168 hours. No results for input(s): LIPASE, AMYLASE in the last 168 hours. No results for input(s): AMMONIA in the last 168 hours. CBC: No results for input(s): WBC, NEUTROABS, HGB, HCT, MCV, PLT in the last 168 hours. Cardiac Enzymes: No results for input(s): CKTOTAL, CKMB, CKMBINDEX, TROPONINI in the last 168 hours. BNP (last 3 results) Recent Labs    01/22/20 0410 02/24/20 0030 05/08/20 0827  BNP 697.9* 184.7* 48.2    ProBNP (last 3 results) No results for input(s): PROBNP in the last 8760 hours.  CBG: No results for input(s): GLUCAP in the last 168 hours.  No results found for this or any previous visit (from the past 240 hour(s)).   Studies: No results found.  Scheduled Meds: . atorvastatin  40 mg Oral Daily  . busPIRone  10 mg Oral TID  . carvedilol  3.125 mg Oral BID WC  . chlorhexidine  15 mL Mouth Rinse BID  . digoxin  0.125 mg  Oral Daily  . fluticasone furoate-vilanterol  1 puff Inhalation Daily  . furosemide  40 mg Oral Daily  . magnesium oxide  400 mg Oral Daily  . mouth rinse  15 mL Mouth Rinse q12n4p  . phenytoin  300 mg Oral BID  . pregabalin  75 mg Oral TID  . QUEtiapine  200 mg Oral Daily  . sacubitril-valsartan  1 tablet Oral BID   Continuous Infusions:   Principal Problem:   Hyponatremia Active Problems:   Chronic systolic CHF (congestive heart failure) (HCC)   Mixed hyperlipidemia   COPD (chronic obstructive pulmonary disease) (HCC)   Seizure disorder (HCC)   Paranoid schizophrenia (HCC)    Hypokalemia   Hypomagnesemia   Suicidal ideation   Noncompliance with medications   Subtherapeutic serum dilantin level   Seizure (HCC)        Consultants:  Dentist/oral surgeon  Procedures:  None  Antibiotics: Anti-infectives (From admission, onward)   Start     Dose/Rate Route Frequency Ordered Stop   05/02/20 1100  amoxicillin-clavulanate (AUGMENTIN) 875-125 MG per tablet 1 tablet  Status:  Discontinued        1 tablet Oral Every 12 hours 05/02/20 1048 05/02/20 1048   05/02/20 1100  amoxicillin-clavulanate (AUGMENTIN) 875-125 MG per tablet 1 tablet        1 tablet Oral Every 12 hours 05/02/20 1048 05/03/20 2143   04/29/20 0800  ceFAZolin (ANCEF) IVPB 2g/100 mL premix        2 g 200 mL/hr over 30 Minutes Intravenous On call to O.R. 04/27/20 1814 04/29/20 0918   04/24/20 2100  Ampicillin-Sulbactam (UNASYN) 3 g in sodium chloride 0.9 % 100 mL IVPB  Status:  Discontinued        3 g 200 mL/hr over 30 Minutes Intravenous Every 6 hours 04/24/20 1936 05/02/20 1048   04/02/20 2200  amoxicillin-clavulanate (AUGMENTIN) 875-125 MG per tablet 1 tablet        1 tablet Oral Every 12 hours 04/02/20 1405 04/04/20 2130   03/29/20 0930  Ampicillin-Sulbactam (UNASYN) 3 g in sodium chloride 0.9 % 100 mL IVPB  Status:  Discontinued        3 g 200 mL/hr over 30 Minutes Intravenous Every 6 hours 03/29/20 0915 04/02/20 1405       Time spent: 25 minutes    Junious Silk ANP  [facility] Pager [phone]. If 7PM-7AM, please contact night-coverage at [email or web address] 05/21/2020, 12:10 PM  LOS: 88 days

## 2020-05-21 NOTE — Evaluation (Addendum)
Occupational Therapy Re-evaluation Patient Details Name: Jim Dunn MRN: [id number] DOB: 1966-03-16 Today's Date: 05/21/2020    History of Present Illness Pt is a 54 y/o male admitted secondary to hyponatremia and suicidal ideation. PMH includes CHF, COPD, seizures, and paranoid schizophrenia.    Clinical Impression   Re-assessment to further assess cognition and performance of IADLs. Pt in recliner with mother at bedside upon arrival. Pt very agreeable therapy. Administering Short Blessed Test to assess cognition. Pt scoring 8 errors placing him in "questional impairment." Pt presenting with ST memory and problem solving deficits. Pt reporting that PTA he received assistance for IADLs including bill management, medication management, grocery shopping, etc. Due to PLOF and current cognition, feel pt will need assistance for IADLs including medication to increase safety and return readmission. Would benefit from increase support at home or ALF for assistance. Will follow acutely to continue assessing cognition and executive functioning.     Follow Up Recommendations  Other (comment);Supervision - Intermittent (S wtih all medication and financial management)    Equipment Recommendations  None recommended by OT    Recommendations for Other Services PT consult     Precautions / Restrictions Precautions Precautions: Fall Precaution Comments: suicide precautions; sitter      Mobility Bed Mobility               General bed mobility comments: In recliner upon arrival  Transfers                      Balance Overall balance assessment: Mild deficits observed, not formally tested (cane at baseline) Sitting-balance support: No upper extremity supported;Feet supported Sitting balance-Leahy Scale: Good Sitting balance - Comments: able to reach to BLEs to don shoes with no LOB                                   ADL either performed or assessed with clinical  judgement   ADL Overall ADL's : Needs assistance/impaired                                       General ADL Comments: Administering the SBT per order of NP. Scored 8. See cognition section. Discussion on impacted IADL areas. Pt reporting his roommate would help with medication managment, bill paying, grocery shopping, etc. Pt will need assistance for IADLs     Vision Baseline Vision/History: Wears glasses (pt reports reading glasses at baseline but doesn't have them here)       Perception     Praxis      Pertinent Vitals/Pain Pain Assessment: No/denies pain     Hand Dominance Left   Extremity/Trunk Assessment Upper Extremity Assessment Upper Extremity Assessment: Overall WFL for tasks assessed   Lower Extremity Assessment Lower Extremity Assessment: Defer to PT evaluation   Cervical / Trunk Assessment Cervical / Trunk Assessment: Normal   Communication Communication Communication: No difficulties   Cognition Arousal/Alertness: Awake/alert Behavior During Therapy: WFL for tasks assessed/performed Overall Cognitive Status: Impaired/Different from baseline Area of Impairment: Memory;Problem solving                     Memory: Decreased short-term memory       Problem Solving: Slow processing;Difficulty sequencing;Requires verbal cues General Comments: paranoid schizophrenia at baseline. Administered the SBT. Pt scoring 8 placing him in  the "questionable impairment" range. Pt presenting with ST memory deficits and problem solving deficits.    General Comments  Mother present throughout    Exercises     Shoulder Instructions      Home Living Family/patient expects to be discharged to:: Private residence Living Arrangements: Parent Available Help at Discharge: Family Type of Home: House Home Access: Level entry     Home Layout: Two level;Bed/bath upstairs;1/2 bath on main level;Other (Comment) Alternate Level Stairs-Number of Steps:  13 Alternate Level Stairs-Rails: Left Bathroom Shower/Tub: Chief Strategy Officer: Standard     Home Equipment: Cane - single point      Lives With: Family    Prior Functioning/Environment Level of Independence: Independent with assistive device(s)        Comments: Mom assists with medicaiton and financial management        OT Problem List: Impaired balance (sitting and/or standing);Decreased safety awareness      OT Treatment/Interventions: Self-care/ADL training;Therapeutic exercise;DME and/or AE instruction;Therapeutic activities;Patient/family education    OT Goals(Current goals can be found in the care plan section) Acute Rehab OT Goals Patient Stated Goal: Have support for harder things. OT Goal Formulation: With patient Time For Goal Achievement: 06/04/20 Potential to Achieve Goals: Good  OT Frequency: Min 2X/week   Barriers to D/C: Decreased caregiver support          Co-evaluation              AM-PAC OT "6 Clicks" Daily Activity     Outcome Measure Help from another person eating meals?: None Help from another person taking care of personal grooming?: None Help from another person toileting, which includes using toliet, bedpan, or urinal?: None Help from another person bathing (including washing, rinsing, drying)?: A Little Help from another person to put on and taking off regular upper body clothing?: None Help from another person to put on and taking off regular lower body clothing?: None 6 Click Score: 23   End of Session Equipment Utilized During Treatment: Other (comment) (cane) Nurse Communication: Mobility status  Activity Tolerance: Patient tolerated treatment well Patient left: in chair;with call bell/phone within reach;with family/visitor present  OT Visit Diagnosis: Unsteadiness on feet (R26.81)                Time: 7564-3329 OT Time Calculation (min): 26 min Charges:  OT General Charges $OT Visit: 1 Visit OT  Evaluation $OT Re-eval: 1 Re-Eval OT Treatments $Self Care/Home Management : 8-22 mins  Taris Galindo MSOT, OTR/L Acute Rehab Pager: [phone] Office: [phone]  Theodoro Grist Ayslin Kundert 05/21/2020, 2:01 PM

## 2020-05-22 LAB — BASIC METABOLIC PANEL
Anion gap: 8 (ref 5–15)
BUN: 10 mg/dL (ref 6–20)
CO2: 23 mmol/L (ref 22–32)
Calcium: 8.9 mg/dL (ref 8.9–10.3)
Chloride: 95 mmol/L — ABNORMAL LOW (ref 98–111)
Creatinine, Ser: 0.47 mg/dL — ABNORMAL LOW (ref 0.61–1.24)
GFR calc Af Amer: >60 mL/min (ref >60)
GFR calc non Af Amer: >60 mL/min (ref >60)
Glucose, Bld: 92 mg/dL (ref 70–99)
Potassium: 4.1 mmol/L (ref 3.5–5.1)
Sodium: 126 mmol/L — ABNORMAL LOW (ref 135–145)

## 2020-05-22 NOTE — Progress Notes (Addendum)
Subjective: Patient denies any complaints.  Informed by the nursing staff that the patient was consuming a lot of fluids.  He was told that he needed to stay within the restricted limit to avoid fluid overload.  Patient denies any chest pain or shortness of breath currently.  Denies any cough.  No dysuria.  Objective: Temperature this morning noted to be 100.5.  Heart rate is 80, respiratory rate 18 blood pressure 142/86 and he is saturating 99% on room air.  General appearance: Awake alert.  In no distress Resp: Clear to auscultation bilaterally.  Normal effort Cardio: S1-S2 is normal regular.  No S3-S4.  No rubs murmurs or bruit GI: Abdomen is soft.  Nontender nondistended.  Bowel sounds are present normal.  No masses organomegaly Extremities: No edema.  Full range of motion of lower extremities.  Sodium noted to be 126 this morning.  Creatinine 0.47.  Chloride 95.   Assessment/plan:  Patient has been in the hospital for 89 days waiting on placement.  He is considered an unsafe discharge at this time.  Main issues include hyponatremia which is thought to be due to combination of SIADH and hypervolemia from systolic CHF.  Patient was started back on Lasix with stable sodium levels.  Noted to be slightly lower today compared to 2 days ago.  No neurological changes noted.  It appears that his sodium has mostly been in the 120s.  Will not change management at this time.  He was told to cut back on his fluid intake.  We will recheck labs on Monday.  Chronic systolic CHF with a EF of 25%.  Currently seems to be euvolemic.  History of depression/paranoid schizophrenia: This is being worked up.  Seen by psychiatry yesterday.  There is some concern for cognitive impairment.  Neuropsych consultation has been requested.  Low-grade fever: No obvious source of infection.  He does not have any new symptoms.  Continue to monitor for now.  If he starts spiking fevers then we will have to check more  labs.  Please review progress note by Junious Silk, NP from 9/17 for further details.  Jim Dunn 05/22/2020

## 2020-05-23 MED ORDER — FUROSEMIDE 10 MG/ML IJ SOLN: 40.0000 mg | Freq: Once | INTRAMUSCULAR | Status: DC

## 2020-05-23 MED ORDER — FUROSEMIDE 40 MG PO TABS
60.0000 mg | ORAL_TABLET | Freq: Once | ORAL | Status: AC
  Administered 2020-05-23: 60 mg via ORAL
  Filled 2020-05-23: qty 1

## 2020-05-23 NOTE — Progress Notes (Signed)
Subjective: Patient denies any complaints this morning.  Denies any shortness of breath or chest pain.  Patient tells me that he has not been drinking a lot of fluids as discussed yesterday.  Objective: His temperature has been normal the last 24 hours.  He did have a temperature of 100.5 yesterday morning but no spikes noted since then.    Other vital signs are stable.    His weight is noted to be higher than last to 3 days compared to before.  He has been recorded as weighing about 99 kg.  He was weighing closer to 96 kg about a week ago.  General appearance: Awake alert.  In no distress Resp: Normal effort.  Somewhat diminished air entry at the bases with a few crackles.  No wheezing or rhonchi. Cardio: S1-S2 is normal regular.  No S3-S4.  No rubs murmurs or bruit GI: Abdomen is soft.  Nontender nondistended.  Bowel sounds are present normal.  No masses organomegaly Extremities: Mild edema bilateral lower extremities.  Able to move his legs. No obvious focal neurological deficits noted.  Assessment/plan:  Patient has been in the hospital for about 90 days awaiting placement.  He is considered an unsafe discharge at this time.  Sodium level noted to be 126 yesterday.  Thought to be due to combination of hypervolemia due to systolic CHF as well as SIADH.  He remains on once a day dose of Lasix.  However his weight seems to be higher than what it was last week.  Will give additional dose of Lasix this evening.  Recheck his sodium level tomorrow morning.  Chronic systolic CHF with a EF of 25%: See above.  Additional dose of Lasix this evening.  Recheck labs tomorrow.  History of depression/paranoid schizophrenia: Seen by psychiatry.  There is some concern for cognitive impairment in his ability to live by himself.  Neuropsychiatry consultation has been requested.  Low-grade fever: Has not been replicated since yesterday morning.  No obvious source of infection.  Will not pursue this further  at this time unless he starts spiking fever again.  Please review progress note by Junious Silk, NP from 9/17 for details regarding other chronic/stable conditions.  Osvaldo Shipper 05/23/2020

## 2020-05-24 LAB — BASIC METABOLIC PANEL
Anion gap: 8 (ref 5–15)
BUN: 14 mg/dL (ref 6–20)
CO2: 24 mmol/L (ref 22–32)
Calcium: 8.8 mg/dL — ABNORMAL LOW (ref 8.9–10.3)
Chloride: 93 mmol/L — ABNORMAL LOW (ref 98–111)
Creatinine, Ser: 0.62 mg/dL (ref 0.61–1.24)
GFR calc Af Amer: >60 mL/min (ref >60)
GFR calc non Af Amer: >60 mL/min (ref >60)
Glucose, Bld: 112 mg/dL — ABNORMAL HIGH (ref 70–99)
Potassium: 4.6 mmol/L (ref 3.5–5.1)
Sodium: 125 mmol/L — ABNORMAL LOW (ref 135–145)

## 2020-05-24 LAB — CBC
HCT: 32 % — ABNORMAL LOW (ref 39.0–52.0)
Hemoglobin: 10.1 g/dL — ABNORMAL LOW (ref 13.0–17.0)
MCH: 22.7 pg — ABNORMAL LOW (ref 26.0–34.0)
MCHC: 31.6 g/dL (ref 30.0–36.0)
MCV: 71.9 fL — ABNORMAL LOW (ref 80.0–100.0)
Platelets: 107 10*3/uL — ABNORMAL LOW (ref 150–400)
RBC: 4.45 MIL/uL (ref 4.22–5.81)
RDW: 18.4 % — ABNORMAL HIGH (ref 11.5–15.5)
WBC: 7.4 10*3/uL (ref 4.0–10.5)
nRBC: 0 % (ref 0.0–0.2)

## 2020-05-24 NOTE — Progress Notes (Signed)
[facility] PROGRESS NOTE  Jim Dunn QIH:[id number] DOB: [birth year] DOA: 02/23/2020 PCP: Pcp, No  Status: Inpatient---Remains inpatient appropriate because:Altered mental status and Unsafe d/c plan   Dispo: The patient is from: Home              Anticipated d/c is to: ALF               Anticipated d/c date is: > 3 days              Patient currently is not medically stable to d/c. **NEEDS SAFE DC PLAN** Currently awaiting placement, TOC team working on this.  Mother is currently working to obtain Medicaid/guardianship. Unsafe for discharge based on underlying schizophrenia and history of recurrent noncompliance which led to patient's current hospitalization.  As of 9/17 given patient's normal MMSE  But abnormal SBT a psychiatric reevaluation has been requested to determine if patient now appropriate to discharge to independent living in a local apartment.  Based on clinical evaluation as well as screening tests during the hospitalization patient appears to have neurocognitive abnormalities of uncertain etiology.  Formal neuropsychiatric consultation has been placed.  Code Status: Full Family Communication: 9/15 updated patient's mother Jim Dunn [ [phone] or [phone]]-mother was also at bedside on 9/17 and spoke extensively with OT during their evaluation DVT prophylaxis: 8/5 refused Lovenox therefore switched to SCDs since ambulating without difficulty Vaccination status: Vaccinated   HPI (time of admission): 54 year old male with past medical history of schizophrenia, seizure disorder, biventricular heart failure(Echo 01/2020 EF 25-30%with right-sided heart failure),severe tricuspid regurgitation, nicotine dependence and COPD who presents to [facility] emergency department with suicidal ideation and seizure.Patient explains that since he left the hospital several days ago(admitted6/17-6/19-was being managed for hyponatremia, thought to be multifactorial in origin.  Patient left AMA on6/19 and that time had sodium of 132),he has been experiencing increasing fatigue. This increasing fatigue is associated with bilateral lower extremity tingling. Symptomsweresimilar to what prompted his presentation on 6/17. Additionally, he reported feeling depressed withsuicidal ideation, withouta plan. Patient reportedthat he hadattempted suicide in the distant past using pills but was unsuccessful and never sought medical attention. While the patient was waiting in the waiting room, he experienced a witnessed seizure while attempting to walk to the bathroom. Patient was immediately brought back to the emergency department for further evaluation. Patient was found to have substantial recurrent hyponatremia of 117 with concurrent hypokalemia of 2.6 and hypomagnesium of 1.4. Patient was administered 1236mg  of intravenous Fosphenytoinand initiated on low rate normal saline infusion. Patient was administered 2 g of intravenous magnesium and a 30 mEq of potassium.  Patient's electrolytes have improved, no further seizure episodes at this time.  Echo showed EF of 25-30%, cardiology recommended outpatient follow-up but otherwise continue Coreg, torsemide and Entresto.    Subjective: Alert and oriented.  No shortness of breath or chest pain.  Does understand that he is incapable of managing safely at home especially in regards to basic IADLs and documented past behavior of lack of judgment when left alone at home.  Objective: Vitals:   05/24/20 0450 05/24/20 0714  BP: 117/80 125/85  Pulse: 78 78  Resp: 14 16  Temp: 98 F (36.7 C) 98 F (36.7 C)  SpO2: 99% 100%    Intake/Output Summary (Last 24 hours) at 05/24/2020 1315 Last data filed at 05/23/2020 2230 Gross per 24 hour  Intake 480 ml  Output --  Net 480 ml   Filed Weights   05/22/20 0500 05/23/20 0426  05/24/20 0500  Weight: 99.9 kg 99.8 kg 99.8 kg    Exam: Constitutional: NAD, mildly anxious but reports  is significantly improved as compared to overnight, comfortable Respiratory: clear to auscultation bilaterally on posterior exam, Normal respiratory effort at rest.  Room air Cardiovascular: Regular rate, no murmurs / rubs / gallops. No lower extremity edema.  No JVD.  Abdomen: no tenderness, Bowel sounds positive.  Increasing abdominal girth Neurologic: CN 2-12 grossly intact. Sensation intact,Strength 5/5 x all 4 extremities. Ambulates independently Psychiatric: Alert and oriented x name and place although does not have appropriate insight or capacity to manage care independently   Assessment/Plan: Hyponatremia with seizures/multifactorial etiology: 1. Intravascular dehydration, resolved 2.  SIADH 3.  Chronic systolic heart failure intermittent volume overload At time of admission diuretics torsemide & metolazone were held and subsequently  discontinued.   Weight had gone up and suspected evolving heart failure therefore Lasix was increased to BID dosing x 3 days and transitioned to 60 mg daily.   9/2 sodium down to 126-weight up slightly but does not appear consistent with heart failure therefore have decreased Lasix back to 40 mg daily Week of 9/4: Due to persistent hyponatremia covering attending physician obtained SIADH labs which confirmed SIADH physiology.  Lasix was discontinued.  Placed on fluid restriction which patient has been inconsistent in following -sodium subsequently decreased despite FR.  Weight has also increased. 9/13 given increasing weight and decreasing sodium over 24 hours despite adhering to 1200 cc FR have opted to resume low-dose Lasix 40 mg daily 9/14 after resuming Lasix sodium has increased to 130 and as of 9/15 was 129 Suspect patient's baseline sodium will be in the 120s  Chronic congestive heart failure with reduced ejection fraction, 25%.-Associated right heart failure.   Lasix decreased to daily but baseline dosage increased from 40 mg to 60 mg given recent  treatment for suspected evolving CHF exacerbation-see above regarding sodium and Lasix adjustments-current dose down to 40 mg daily as above Echo May 2021-EF 25-30% Continue Coreg, Entresto, Lasix along with Daily weights with strict I/O  Unable to uptitrate Entresto due to lower range BP readings Counseled extensively by multiple providers regarding need to adhere to salt restricted diet well as fluid restriction but patient remains noncompliant Cardiology/Dr. Sharyn Lull recommended cardiac follow-up after discharge and likely will need follow-up echo-if EF remains 30% or less would need to be referred to EP clinic for consideration for ICD implantation 9/17 asked staff to begin educating patient regarding management of CHF.  Area request is to get patient to participate in monitoring tasks such as obtaining daily weight and writing down; in addition I have asked for dietitian to come by and provide written educational materials regarding heart failure diet. Dry weight unclear but appears to be between 216 and 220 pounds.  Due to weight gain over the weekend was given extra Lasix with subsequent decrease in sodium to 125 with no significant change in weight.   Depression/paranoid schizophrenia, Suicidal ideations--Functional Dementia: Resolved, seen by psychiatry early during the hospitalization.  Recommended to continue BuSpar, Invega and Seroquel.   Follow-up with outpatient psychiatry after.   Continues with a pattern of increasing anxiety 1 week prior to repeat dosing of Invega which is responsive to prn Klonopin  Scored very well on MMSE testing (30) but scored poorly on Short Blessed Test which is consistent w/ functional short term memory deficits c/w a dementia diagnosis.  Appreciate psychiatry evaluation-see note for details-patient has capacity but lacks judgment therefore given  abnormal SBT as described below have asked for formal neuropsychiatric evaluation " 9/17 OT administered the SBT.  He scored an 8 placing him in "questional impairment." His main deficits were in memory and problem solving. I feel he will have difficulty with IADLs including medication management, bill paying, grocery shopping, etc. Apparently (according to Mr. Whiten and his mother), his roommate did these things for him PTA. I will write my note soon and have the numbers in for you. He will need assistance for IADLs."  Recurrent dental abscesses status post full dental extraction on 8/26 Recurrent.  Initial episode in July that responded to IV antibiotics recurrent episode in August.  Both with confirmed dental abscess orthopantogram in both episodes responsive to IV Unasyn. Status post full dental extraction on 8/26.  Need to follow-up with dental surgery after discharge to ensure appropriate wound healing and evaluation for dentures  Insomnia Unclear if related to volume overload and patient not perceptive that respiratory symptoms may be contributing-treating volume overload as above Patient requested Lunesta which is nonformulary We will increase Ambien from 5 mg to 10 mg as needed Continue melatonin as well  Seizure disorder: Stable Secondary to hyponatremia and issues of nonadherence with medications prior to admission.   Dilantin level subtherapeutic on 7/18 at 2.6 with dosage adjustments made several times during the admission based on Dilantin level 8/11 Dilantin level therapeutic at 11.7 -continue current dose 8/27 follow-up Dilantin level 12.9-unless showed signs of toxicity no indication to repeat Dilantin level for at least 6 weeks  Microcytic anemia Iron studies relatively normal, hemoglobin stable If has significant bleeding postoperatively may need to repeat CBC for several days.  OSA Continue nocturnal CPAP-changing to different mask briefly improve CPAP compliance but currently is not wearing consistently  COPD: stable. As needed bronchodilators  Hyperlipidemia: Daily Lipitor  40 mg  Hypokalemia/hypomagnesemia Replace as needed  Peripheral neuropathy Changed gabapentin to Lyrica 50 mg TID and subsequently titrated up to 75 mg Repeat B12 level 401  Modest obesity Estimated body mass index is 33.47 kg/m as calculated from the following:   Height as of this encounter: 5\' 8"  (1.727 m).   Weight as of this encounter: 99.8 kg.      Data Reviewed: Basic Metabolic Panel: Recent Labs  Lab 05/18/20 0400 05/19/20 0210 05/20/20 0946 05/22/20 0149 05/24/20 0203  NA 130* 129* 128* 126* 125*  K 4.6 4.3 4.1 4.1 4.6  CL 97* 97* 93* 95* 93*  CO2 25 23 26 23 24   GLUCOSE 90 99 93 92 112*  BUN 7 11 8 10 14   CREATININE 0.50* 0.49* 0.60* 0.47* 0.62  CALCIUM 9.3 9.4 9.5 8.9 8.8*   Liver Function Tests: No results for input(s): AST, ALT, ALKPHOS, BILITOT, PROT, ALBUMIN in the last 168 hours. No results for input(s): LIPASE, AMYLASE in the last 168 hours. No results for input(s): AMMONIA in the last 168 hours. CBC: Recent Labs  Lab 05/24/20 0203  WBC 7.4  HGB 10.1*  HCT 32.0*  MCV 71.9*  PLT 107*   Cardiac Enzymes: No results for input(s): CKTOTAL, CKMB, CKMBINDEX, TROPONINI in the last 168 hours. BNP (last 3 results) Recent Labs    01/22/20 0410 02/24/20 0030 05/08/20 0827  BNP 697.9* 184.7* 48.2    ProBNP (last 3 results) No results for input(s): PROBNP in the last 8760 hours.  CBG: No results for input(s): GLUCAP in the last 168 hours.  No results found for this or any previous visit (from the past 240  hour(s)).   Studies: No results found.  Scheduled Meds: . atorvastatin  40 mg Oral Daily  . busPIRone  10 mg Oral TID  . carvedilol  3.125 mg Oral BID WC  . chlorhexidine  15 mL Mouth Rinse BID  . digoxin  0.125 mg Oral Daily  . fluticasone furoate-vilanterol  1 puff Inhalation Daily  . furosemide  40 mg Oral Daily  . magnesium oxide  400 mg Oral Daily  . mouth rinse  15 mL Mouth Rinse q12n4p  . phenytoin  300 mg Oral BID  .  pregabalin  75 mg Oral TID  . QUEtiapine  200 mg Oral Daily  . sacubitril-valsartan  1 tablet Oral BID   Continuous Infusions:   Principal Problem:   Hyponatremia Active Problems:   Chronic systolic CHF (congestive heart failure) (HCC)   Mixed hyperlipidemia   COPD (chronic obstructive pulmonary disease) (HCC)   Seizure disorder (HCC)   Paranoid schizophrenia (HCC)   Hypokalemia   Hypomagnesemia   Suicidal ideation   Noncompliance with medications   Subtherapeutic serum dilantin level   Seizure (HCC)        Consultants:  Dentist/oral surgeon  Procedures:  None  Antibiotics: Anti-infectives (From admission, onward)   Start     Dose/Rate Route Frequency Ordered Stop   05/02/20 1100  amoxicillin-clavulanate (AUGMENTIN) 875-125 MG per tablet 1 tablet  Status:  Discontinued        1 tablet Oral Every 12 hours 05/02/20 1048 05/02/20 1048   05/02/20 1100  amoxicillin-clavulanate (AUGMENTIN) 875-125 MG per tablet 1 tablet        1 tablet Oral Every 12 hours 05/02/20 1048 05/03/20 2143   04/29/20 0800  ceFAZolin (ANCEF) IVPB 2g/100 mL premix        2 g 200 mL/hr over 30 Minutes Intravenous On call to O.R. 04/27/20 1814 04/29/20 0918   04/24/20 2100  Ampicillin-Sulbactam (UNASYN) 3 g in sodium chloride 0.9 % 100 mL IVPB  Status:  Discontinued        3 g 200 mL/hr over 30 Minutes Intravenous Every 6 hours 04/24/20 1936 05/02/20 1048   04/02/20 2200  amoxicillin-clavulanate (AUGMENTIN) 875-125 MG per tablet 1 tablet        1 tablet Oral Every 12 hours 04/02/20 1405 04/04/20 2130   03/29/20 0930  Ampicillin-Sulbactam (UNASYN) 3 g in sodium chloride 0.9 % 100 mL IVPB  Status:  Discontinued        3 g 200 mL/hr over 30 Minutes Intravenous Every 6 hours 03/29/20 0915 04/02/20 1405       Time spent: 25 minutes    Junious Silk ANP  [facility] Pager [phone]. If 7PM-7AM, please contact night-coverage at [email or web address] 05/24/2020, 1:15 PM  LOS: 91 days

## 2020-05-25 NOTE — Progress Notes (Signed)
Occupational Therapy Treatment Patient Details Name: Jim Dunn MRN: [id number] DOB: 03/06/1966 Today's Date: 05/25/2020    History of present illness Pt is a 54 y/o male admitted secondary to hyponatremia and suicidal ideation. PMH includes CHF, COPD, seizures, and paranoid schizophrenia.    OT comments  Pt provided pill box test with multiple errors and required >10 minutes to finalize the task. Recommendation for (A) for all medication management for d/c recommendations. Pt with near fall from bed surface due to lack of awareness to how close he was to the eob upon waking. If OT were not present, pt would have fallen. Continue to recommend therapy follow up at this time.    Follow Up Recommendations  Supervision - Intermittent    Equipment Recommendations  None recommended by OT    Recommendations for Other Services      Precautions / Restrictions Precautions Precautions: Fall       Mobility Bed Mobility Overal bed mobility: Needs Assistance Bed Mobility: Supine to Sit     Supine to sit: Max assist     General bed mobility comments: OT arriving and pt asleep. pt attempting to sit on the eob and nearing sitting in floor due to poor awareness to space. OT max (A) bil LE onto bed surface placing pt on belly in bed. Pt then able to on all 4 extremities progress to EOB and sit appropriately. Pt static sitting MOD I.   Transfers     Transfers: Sit to/from Stand Sit to Stand: Supervision         General transfer comment: bed to chair with chair alarm set    Balance                                           ADL either performed or assessed with clinical judgement   ADL                                               Vision       Perception     Praxis      Cognition Arousal/Alertness: Awake/alert Behavior During Therapy: WFL for tasks assessed/performed Overall Cognitive Status: Impaired/Different from baseline                                  General Comments: pt provided with pill box test and noted to have multiple errors and lack awareness to errors. pt does at one point read label to OT and states I dont understand what this mean. Pt was seeking (A) from OT during the assessment. OT asking guiding cue back to patient whom states "oh okay " and then proceeds to incorrectly place pills. Pt with near fall out of bed and lack awareness to how closely he came to falling        Exercises     Shoulder Instructions       General Comments      Pertinent Vitals/ Pain          Home Living  Prior Functioning/Environment              Frequency  Min 2X/week        Progress Toward Goals  OT Goals(current goals can now be found in the care plan section)  Progress towards OT goals: Progressing toward goals  Acute Rehab OT Goals Patient Stated Goal: i think pharmacy shoudl make all my medications and put it in a calendar thing like before OT Goal Formulation: With patient Time For Goal Achievement: 06/04/20 Potential to Achieve Goals: Good ADL Goals Pt Will Perform Lower Body Bathing: with modified independence;sit to/from stand Pt Will Perform Lower Body Dressing: with modified independence;sit to/from stand Pt Will Transfer to Toilet: with modified independence;ambulating Pt Will Perform Toileting - Clothing Manipulation and hygiene: with modified independence;sit to/from stand Additional ADL Goal #1: Pt will independently verbalize 3 strategies to reduce risk of falls  Plan Discharge plan remains appropriate    Co-evaluation                 AM-PAC OT "6 Clicks" Daily Activity     Outcome Measure   Help from another person eating meals?: None Help from another person taking care of personal grooming?: None Help from another person toileting, which includes using toliet, bedpan, or urinal?: None Help from  another person bathing (including washing, rinsing, drying)?: A Little Help from another person to put on and taking off regular upper body clothing?: None Help from another person to put on and taking off regular lower body clothing?: None 6 Click Score: 23    End of Session    OT Visit Diagnosis: Unsteadiness on feet (R26.81)   Activity Tolerance Patient tolerated treatment well   Patient Left in chair;with call bell/phone within reach;with chair alarm set   Nurse Communication Mobility status;Precautions        Time: 5093-2671 OT Time Calculation (min): 19 min  Charges: OT General Charges $OT Visit: 1 Visit OT Treatments $Cognitive Funtion inital: Initial 15 mins   Brynn, OTR/L  Acute Rehabilitation Services Pager: [phone] Office: [phone] .     Mateo Flow 05/25/2020, 3:21 PM

## 2020-05-25 NOTE — Care Management Important Message (Signed)
Important Message  Patient Details  Name: Jim Dunn MRN: [id number] Date of Birth: 1966/04/12   Medicare Important Message Given:  Yes     Leonna Schlee 05/25/2020, 1:23 PM

## 2020-05-25 NOTE — Progress Notes (Signed)
[facility] PROGRESS NOTE  Jim Dunn STM:[id number] DOB: 1965/10/30 DOA: 02/23/2020 PCP: Pcp, No  Status: Inpatient---Remains inpatient appropriate because:Altered mental status and Unsafe d/c plan   Dispo: The patient is from: Home              Anticipated d/c is to: ALF               Anticipated d/c date is: > 3 days              Patient currently is not medically stable to d/c. **NEEDS SAFE DC PLAN** Currently awaiting placement, TOC team working on this.  Mother is currently working to obtain Medicaid/guardianship. Unsafe for discharge based on underlying schizophrenia and history of recurrent noncompliance which led to patient's current hospitalization.  As of 9/17 given patient's normal MMSE  But abnormal SBT a psychiatric reevaluation has been requested to determine if patient now appropriate to discharge to independent living in a local apartment.  Based on clinical evaluation as well as screening tests during the hospitalization patient appears to have neurocognitive abnormalities of uncertain etiology.  Formal neuropsychiatric consultation has been placed.  Code Status: Full Family Communication: 9/15 updated [name] [phone] or [phone] was also at bedside on 9/17 and spoke extensively with OT during their evaluation DVT prophylaxis: 8/5 refused Lovenox therefore switched to SCDs since ambulating without difficulty Vaccination status: Vaccinated   HPI (time of admission): 54 year old male with past medical history of schizophrenia, seizure disorder, biventricular heart failure(Echo 01/2020 EF 25-30%with right-sided heart failure),severe tricuspid regurgitation, nicotine dependence and COPD who presents to [facility] emergency department with suicidal ideation and seizure.Patient explains that since he left the hospital several days ago(admitted6/17-6/19-was being managed for hyponatremia, thought to be multifactorial in origin.  Patient left AMA on6/19 and that time had sodium of 132),he has been experiencing increasing fatigue. This increasing fatigue is associated with bilateral lower extremity tingling. Symptomsweresimilar to what prompted his presentation on 6/17. Additionally, he reported feeling depressed withsuicidal ideation, withouta plan. Patient reportedthat he hadattempted suicide in the distant past using pills but was unsuccessful and never sought medical attention. While the patient was waiting in the waiting room, he experienced a witnessed seizure while attempting to walk to the bathroom. Patient was immediately brought back to the emergency department for further evaluation. Patient was found to have substantial recurrent hyponatremia of 117 with concurrent hypokalemia of 2.6 and hypomagnesium of 1.4. Patient was administered 1236mg  of intravenous Fosphenytoinand initiated on low rate normal saline infusion. Patient was administered 2 g of intravenous magnesium and a 30 mEq of potassium.  Patient's electrolytes have improved, no further seizure episodes at this time.  Echo showed EF of 25-30%, cardiology recommended outpatient follow-up but otherwise continue Coreg, torsemide and Entresto.    Subjective: Alert and appropriate.  Asking questions about discharge disposition.  Reexplained purpose of pending neuro psychological evaluation.  Objective: Vitals:   05/25/20 0818 05/25/20 1214  BP: (!) 158/91 (!) 146/91  Pulse: 75 94  Resp: 16 18  Temp: 98.7 F (37.1 C) 98.3 F (36.8 C)  SpO2: 100% 100%    Intake/Output Summary (Last 24 hours) at 05/25/2020 1230 Last data filed at 05/24/2020 1700 Gross per 24 hour  Intake 240 ml  Output --  Net 240 ml   Filed Weights   05/23/20 0426 05/24/20 0500 05/25/20 0645  Weight: 99.8 kg 99.8 kg 98.2 kg    Exam: Constitutional: NAD, pleasant and nonanxious. Respiratory: clear to auscultation bilaterally  on posterior exam, Normal respiratory  effort at rest.  Room air Cardiovascular: Regular rate, no murmurs / rubs / gallops. No lower extremity edema.  No JVD.  Abdomen: no tenderness, Bowel sounds positive.  Increasing abdominal girth Neurologic: CN 2-12 grossly intact. Sensation intact,Strength 5/5 x all 4 extremities. Ambulates independently Psychiatric: Alert and oriented x name, year and place although does not have appropriate insight or capacity to manage care independently   Assessment/Plan: Hyponatremia with seizures/multifactorial etiology: 1. Intravascular dehydration, resolved 2.  SIADH 3.  Chronic systolic heart failure intermittent volume overload At time of admission diuretics torsemide & metolazone were held and subsequently  discontinued.   Weight had gone up and suspected evolving heart failure therefore Lasix was increased to BID dosing x 3 days and transitioned to 60 mg daily.   9/2 sodium down to 126-weight up slightly but does not appear consistent with heart failure therefore have decreased Lasix back to 40 mg daily Week of 9/4: Due to persistent hyponatremia covering attending physician obtained SIADH labs which confirmed SIADH physiology.  Lasix was discontinued.  Placed on fluid restriction which patient has been inconsistent in following -sodium subsequently decreased despite FR.  Weight has also increased. 9/13 given increasing weight and decreasing sodium over 24 hours despite adhering to 1200 cc FR have opted to resume low-dose Lasix 40 mg daily 9/14 after resuming Lasix sodium has increased to 130 and as of 9/15 was 129 Will repeat electrolyte panel again on 9/22 Suspect patient's baseline sodium will be in the 120s  Chronic congestive heart failure with reduced ejection fraction, 25%.-Associated right heart failure.   Lasix decreased to daily but baseline dosage increased from 40 mg to 60 mg given recent treatment for suspected evolving CHF exacerbation-see above regarding sodium and Lasix  adjustments-current dose down to 40 mg daily as above Echo May 2021-EF 25-30% Continue Coreg, Entresto, Lasix along with Daily weights with strict I/O  Unable to uptitrate Entresto due to lower range BP readings Counseled extensively by multiple providers regarding need to adhere to salt restricted diet well as fluid restriction but patient remains noncompliant Cardiology/Dr. Sharyn Lull recommended cardiac follow-up after discharge and likely will need follow-up echo-if EF remains 30% or less would need to be referred to EP clinic for consideration for ICD implantation 9/17 asked staff to begin educating patient regarding management of CHF.   Dry weight unclear but appears to be between 216 and 220 pounds.  Due to weight gain over the weekend was given extra Lasix with subsequent decrease in sodium to 125 with no significant change in weight.   Depression/paranoid schizophrenia, Suicidal ideations--Functional Dementia: Resolved, seen by psychiatry early during the hospitalization.  Recommended to continue BuSpar, Invega and Seroquel.   Follow-up with outpatient psychiatry after.   Continues with a pattern of increasing anxiety 1 week prior to repeat dosing of Invega which is responsive to prn Klonopin  Scored very well on MMSE testing (30) but scored poorly on Short Blessed Test which is consistent w/ functional short term memory deficits c/w a dementia diagnosis.  Appreciate psychiatry evaluation-see note for details-patient has capacity but lacks judgment therefore given abnormal SBT as described below have asked for formal neuropsychiatric evaluation " 9/17 OT administered the SBT. He scored an 8 placing him in "questional impairment." His main deficits were in memory and problem solving. I feel he will have difficulty with IADLs including medication management, bill paying, grocery shopping, etc. Apparently (according to Mr. Kimura and his mother), his roommate did  these things for him PTA. I will  write my note soon and have the numbers in for you. He will need assistance for IADLs."  Recurrent dental abscesses status post full dental extraction on 8/26 Recurrent.  Initial episode in July that responded to IV antibiotics recurrent episode in August.  Both with confirmed dental abscess orthopantogram in both episodes responsive to IV Unasyn. Status post full dental extraction on 8/26.  Need to follow-up with dental surgery after discharge to ensure appropriate wound healing and evaluation for dentures  Insomnia Unclear if related to volume overload and patient not perceptive that respiratory symptoms may be contributing-treating volume overload as above Patient requested Lunesta which is nonformulary We will increase Ambien from 5 mg to 10 mg as needed Continue melatonin as well  Seizure disorder: Stable Secondary to hyponatremia and issues of nonadherence with medications prior to admission.   Dilantin level subtherapeutic on 7/18 at 2.6 with dosage adjustments made several times during the admission based on Dilantin level 8/11 Dilantin level therapeutic at 11.7 -continue current dose 8/27 follow-up Dilantin level 12.9-unless showed signs of toxicity no indication to repeat Dilantin level for at least 6 weeks  Microcytic anemia Iron studies relatively normal, hemoglobin stable If has significant bleeding postoperatively may need to repeat CBC for several days.  OSA Continue nocturnal CPAP-changing to different mask briefly improve CPAP compliance but currently is not wearing consistently  COPD: stable. As needed bronchodilators  Hyperlipidemia: Daily Lipitor 40 mg  Hypokalemia/hypomagnesemia Replace as needed  Peripheral neuropathy Changed gabapentin to Lyrica 50 mg TID and subsequently titrated up to 75 mg Repeat B12 level 401  Modest obesity Estimated body mass index is 32.93 kg/m as calculated from the following:   Height as of this encounter: 5\' 8"  (1.727  m).   Weight as of this encounter: 98.2 kg.      Data Reviewed: Basic Metabolic Panel: Recent Labs  Lab 05/19/20 0210 05/20/20 0946 05/22/20 0149 05/24/20 0203  NA 129* 128* 126* 125*  K 4.3 4.1 4.1 4.6  CL 97* 93* 95* 93*  CO2 23 26 23 24   GLUCOSE 99 93 92 112*  BUN 11 8 10 14   CREATININE 0.49* 0.60* 0.47* 0.62  CALCIUM 9.4 9.5 8.9 8.8*   Liver Function Tests: No results for input(s): AST, ALT, ALKPHOS, BILITOT, PROT, ALBUMIN in the last 168 hours. No results for input(s): LIPASE, AMYLASE in the last 168 hours. No results for input(s): AMMONIA in the last 168 hours. CBC: Recent Labs  Lab 05/24/20 0203  WBC 7.4  HGB 10.1*  HCT 32.0*  MCV 71.9*  PLT 107*   Cardiac Enzymes: No results for input(s): CKTOTAL, CKMB, CKMBINDEX, TROPONINI in the last 168 hours. BNP (last 3 results) Recent Labs    01/22/20 0410 02/24/20 0030 05/08/20 0827  BNP 697.9* 184.7* 48.2    ProBNP (last 3 results) No results for input(s): PROBNP in the last 8760 hours.  CBG: No results for input(s): GLUCAP in the last 168 hours.  No results found for this or any previous visit (from the past 240 hour(s)).   Studies: No results found.  Scheduled Meds: . atorvastatin  40 mg Oral Daily  . busPIRone  10 mg Oral TID  . carvedilol  3.125 mg Oral BID WC  . chlorhexidine  15 mL Mouth Rinse BID  . digoxin  0.125 mg Oral Daily  . fluticasone furoate-vilanterol  1 puff Inhalation Daily  . furosemide  40 mg Oral Daily  . magnesium oxide  400 mg Oral Daily  . mouth rinse  15 mL Mouth Rinse q12n4p  . phenytoin  300 mg Oral BID  . pregabalin  75 mg Oral TID  . QUEtiapine  200 mg Oral Daily  . sacubitril-valsartan  1 tablet Oral BID   Continuous Infusions:   Principal Problem:   Hyponatremia Active Problems:   Chronic systolic CHF (congestive heart failure) (HCC)   Mixed hyperlipidemia   COPD (chronic obstructive pulmonary disease) (HCC)   Seizure disorder (HCC)   Paranoid  schizophrenia (HCC)   Hypokalemia   Hypomagnesemia   Suicidal ideation   Noncompliance with medications   Subtherapeutic serum dilantin level   Seizure (HCC)        Consultants:  Dentist/oral surgeon  Procedures:  None  Antibiotics: Anti-infectives (From admission, onward)   Start     Dose/Rate Route Frequency Ordered Stop   05/02/20 1100  amoxicillin-clavulanate (AUGMENTIN) 875-125 MG per tablet 1 tablet  Status:  Discontinued        1 tablet Oral Every 12 hours 05/02/20 1048 05/02/20 1048   05/02/20 1100  amoxicillin-clavulanate (AUGMENTIN) 875-125 MG per tablet 1 tablet        1 tablet Oral Every 12 hours 05/02/20 1048 05/03/20 2143   04/29/20 0800  ceFAZolin (ANCEF) IVPB 2g/100 mL premix        2 g 200 mL/hr over 30 Minutes Intravenous On call to O.R. 04/27/20 1814 04/29/20 0918   04/24/20 2100  Ampicillin-Sulbactam (UNASYN) 3 g in sodium chloride 0.9 % 100 mL IVPB  Status:  Discontinued        3 g 200 mL/hr over 30 Minutes Intravenous Every 6 hours 04/24/20 1936 05/02/20 1048   04/02/20 2200  amoxicillin-clavulanate (AUGMENTIN) 875-125 MG per tablet 1 tablet        1 tablet Oral Every 12 hours 04/02/20 1405 04/04/20 2130   03/29/20 0930  Ampicillin-Sulbactam (UNASYN) 3 g in sodium chloride 0.9 % 100 mL IVPB  Status:  Discontinued        3 g 200 mL/hr over 30 Minutes Intravenous Every 6 hours 03/29/20 0915 04/02/20 1405       Time spent: 25 minutes    Junious Silk ANP  [facility] Pager [phone]. If 7PM-7AM, please contact night-coverage at [email or web address] 05/25/2020, 12:30 PM  LOS: 92 days

## 2020-05-26 LAB — BASIC METABOLIC PANEL
Anion gap: 6 (ref 5–15)
BUN: 10 mg/dL (ref 6–20)
CO2: 24 mmol/L (ref 22–32)
Calcium: 9 mg/dL (ref 8.9–10.3)
Chloride: 95 mmol/L — ABNORMAL LOW (ref 98–111)
Creatinine, Ser: 0.55 mg/dL — ABNORMAL LOW (ref 0.61–1.24)
GFR calc Af Amer: >60 mL/min (ref >60)
GFR calc non Af Amer: >60 mL/min (ref >60)
Glucose, Bld: 94 mg/dL (ref 70–99)
Potassium: 4.3 mmol/L (ref 3.5–5.1)
Sodium: 125 mmol/L — ABNORMAL LOW (ref 135–145)

## 2020-05-26 NOTE — Progress Notes (Signed)
[facility] PROGRESS NOTE  Jim Dunn XBJ:[id number] DOB: [birth year] DOA: 02/23/2020 PCP: Pcp, No  Status: Inpatient---Remains inpatient appropriate because:Altered mental status and Unsafe d/c plan   Dispo: The patient is from: Home              Anticipated d/c is to: ALF               Anticipated d/c date is: > 3 days              Patient currently is not medically stable to d/c. **NEEDS SAFE DC PLAN** Currently awaiting placement, TOC team working on this.  Mother is currently working to obtain Medicaid/guardianship. Unsafe for discharge based on underlying schizophrenia and history of recurrent noncompliance which led to patient's current hospitalization.  As of 9/17 given patient's normal MMSE in the context of an abnormal SBT, a psychiatric reevaluation has been requested to determine if patient now appropriate to discharge to independent living in a local apartment.  Based on clinical evaluation as well as screening tests during the hospitalization patient appears to have neurocognitive abnormalities of uncertain etiology.  Formal neuropsychiatric consultation has been placed.  Code Status: Full Family Communication: 9/15 updated [name] [phone] or [phone] was also at bedside on 9/17 and spoke extensively with OT during their evaluation DVT prophylaxis: 8/5 refused Lovenox therefore switched to SCDs since ambulating without difficulty Vaccination status: Vaccinated   HPI (time of admission): 54 year old male with past medical history of schizophrenia, seizure disorder, biventricular heart failure(Echo 01/2020 EF 25-30%with right-sided heart failure),severe tricuspid regurgitation, nicotine dependence and COPD who presents to [facility] emergency department with suicidal ideation and seizure.Patient explains that since he left the hospital several days ago(admitted6/17-6/19-was being managed for hyponatremia, thought to be  multifactorial in origin. Patient left AMA on6/19 and that time had sodium of 132),he has been experiencing increasing fatigue. This increasing fatigue is associated with bilateral lower extremity tingling. Symptomsweresimilar to what prompted his presentation on 6/17. Additionally, he reported feeling depressed withsuicidal ideation, withouta plan. Patient reportedthat he hadattempted suicide in the distant past using pills but was unsuccessful and never sought medical attention. While the patient was waiting in the waiting room, he experienced a witnessed seizure while attempting to walk to the bathroom. Patient was immediately brought back to the emergency department for further evaluation. Patient was found to have substantial recurrent hyponatremia of 117 with concurrent hypokalemia of 2.6 and hypomagnesium of 1.4. Patient was administered 1236mg  of intravenous Fosphenytoinand initiated on low rate normal saline infusion. Patient was administered 2 g of intravenous magnesium and a 30 mEq of potassium.  Patient's electrolytes have improved, no further seizure episodes at this time.  Echo showed EF of 25-30%, cardiology recommended outpatient follow-up but otherwise continue Coreg, torsemide and Entresto.    Subjective: Sleeping soundly. I did not awaken.  Objective: Vitals:   05/26/20 0741 05/26/20 1204  BP:  129/70  Pulse:  66  Resp:  18  Temp:  98.9 F (37.2 C)  SpO2: 98% 96%    Intake/Output Summary (Last 24 hours) at 05/26/2020 1245 Last data filed at 05/26/2020 0900 Gross per 24 hour  Intake 1364 ml  Output --  Net 1364 ml   Filed Weights   05/24/20 0500 05/25/20 0645 05/26/20 0547  Weight: 99.8 kg 98.2 kg 99.5 kg    Exam: Constitutional: NAD, pleasant and nonanxious. Respiratory: clear to auscultation bilaterally on posterior exam, Normal respiratory effort at rest.  Room air Cardiovascular: Regular  rate, no murmurs / rubs / gallops. No lower extremity edema.   No JVD.  Abdomen: no tenderness, Bowel sounds positive.  Increasing abdominal girth Neurologic: But at baseline CN 2-12 grossly intact. Sensation intact,Strength 5/5 x all 4 extremities. Ambulates independently but does have some balance difficulties when initially getting out of bed while sleepy as documented by PT/OT Psychiatric: Sleeping but at baseline alert and oriented x name, year and place although does not have appropriate insight or capacity to manage care independently   Assessment/Plan: Hyponatremia with seizures/multifactorial etiology: 1. Intravascular dehydration, resolved 2.  SIADH 3.  Chronic systolic heart failure intermittent volume overload At time of admission diuretics torsemide & metolazone were held and subsequently  discontinued.   Weight had gone up and suspected evolving heart failure therefore Lasix was increased to BID dosing x 3 days and transitioned to 60 mg daily.   9/2 sodium down to 126-weight up slightly but does not appear consistent with heart failure therefore have decreased Lasix back to 40 mg daily Week of 9/4: Due to persistent hyponatremia covering attending physician obtained SIADH labs which confirmed SIADH physiology.  Lasix was discontinued.  Placed on fluid restriction which patient has been inconsistent in following -sodium subsequently decreased despite FR.  Weight has also increased. 9/13 given increasing weight and decreasing sodium over 24 hours despite adhering to 1200 cc FR have opted to resume low-dose Lasix 40 mg daily 9/14 after resuming Lasix sodium has increased to 130 and as of 9/15 was 129 As of 9/22 sodium stable at 125 Suspect patient's baseline sodium will be in the 120s  Chronic congestive heart failure with reduced ejection fraction, 25%.-Associated right heart failure.   Lasix decreased to daily but baseline dosage increased from 40 mg to 60 mg given recent treatment for suspected evolving CHF exacerbation-see above regarding  sodium and Lasix adjustments-current dose down to 40 mg daily as above Echo May 2021-EF 25-30% Continue Coreg, Entresto, Lasix along with Daily weights with strict I/O  Unable to uptitrate Entresto due to lower range BP readings Counseled extensively by multiple providers regarding need to adhere to salt restricted diet well as fluid restriction but patient remains noncompliant Cardiology/Dr. Sharyn Lull recommended cardiac follow-up after discharge and likely will need follow-up echo-if EF remains 30% or less would need to be referred to EP clinic for consideration for ICD implantation 9/17 asked staff to begin educating patient regarding management of CHF.   Dry weight unclear but appears to be between 216 and 220 pounds.  Due to weight gain over the weekend was given extra Lasix with subsequent decrease in sodium to 125 with no significant change in weight.   Depression/paranoid schizophrenia, Suicidal ideations--Functional Dementia: Resolved, seen by psychiatry early during the hospitalization.  Recommended to continue BuSpar, Invega and Seroquel.   Follow-up with outpatient psychiatry after.   Continues with a pattern of increasing anxiety 1 week prior to repeat dosing of Invega which is responsive to prn Klonopin  Scored very well on MMSE testing (30) but scored poorly on Short Blessed Test which is consistent w/ functional short term memory deficits c/w a dementia diagnosis.  Appreciate psychiatry evaluation-see note for details-patient has capacity but lacks judgment therefore given abnormal SBT -awaiting formal neuropsychiatric evaluation " 9/17 OT administered the SBT. He scored an 8 placing him in "questional impairment." His main deficits were in memory and problem solving. I feel he will have difficulty with IADLs including medication management, bill paying, grocery shopping, etc. Apparently (according to Mr.  Visser and his mother), his roommate did these things for him PTA. I will write my  note soon and have the numbers in for you. He will need assistance for IADLs."  Recurrent dental abscesses status post full dental extraction on 8/26 Recurrent.  Initial episode in July that responded to IV antibiotics recurrent episode in August.  Both with confirmed dental abscess orthopantogram in both episodes responsive to IV Unasyn. Status post full dental extraction on 8/26.  Need to follow-up with dental surgery after discharge to ensure appropriate wound healing and evaluation for dentures  Insomnia Unclear if related to volume overload and patient not perceptive that respiratory symptoms may be contributing-treating volume overload as above Patient requested Lunesta which is nonformulary We will increase Ambien from 5 mg to 10 mg as needed Continue melatonin as well  Seizure disorder: Stable Secondary to hyponatremia and issues of nonadherence with medications prior to admission.   Dilantin level subtherapeutic on 7/18 at 2.6 with dosage adjustments made several times during the admission based on Dilantin level 8/11 Dilantin level therapeutic at 11.7 -continue current dose 8/27 follow-up Dilantin level 12.9-unless showed signs of toxicity no indication to repeat Dilantin level for at least 6 weeks  Microcytic anemia Iron studies relatively normal, hemoglobin stable If has significant bleeding postoperatively may need to repeat CBC for several days.  OSA Continue nocturnal CPAP-changing to different mask briefly improve CPAP compliance but currently is not wearing consistently  COPD: stable. As needed bronchodilators  Hyperlipidemia: Daily Lipitor 40 mg  Hypokalemia/hypomagnesemia Replace as needed  Peripheral neuropathy Changed gabapentin to Lyrica 50 mg TID and subsequently titrated up to 75 mg Repeat B12 level 401  Modest obesity Estimated body mass index is 33.36 kg/m as calculated from the following:   Height as of this encounter: 5\' 8"  (1.727 m).    Weight as of this encounter: 99.5 kg.      Data Reviewed: Basic Metabolic Panel: Recent Labs  Lab 05/20/20 0946 05/22/20 0149 05/24/20 0203 05/26/20 0327  NA 128* 126* 125* 125*  K 4.1 4.1 4.6 4.3  CL 93* 95* 93* 95*  CO2 26 23 24 24   GLUCOSE 93 92 112* 94  BUN 8 10 14 10   CREATININE 0.60* 0.47* 0.62 0.55*  CALCIUM 9.5 8.9 8.8* 9.0   Liver Function Tests: No results for input(s): AST, ALT, ALKPHOS, BILITOT, PROT, ALBUMIN in the last 168 hours. No results for input(s): LIPASE, AMYLASE in the last 168 hours. No results for input(s): AMMONIA in the last 168 hours. CBC: Recent Labs  Lab 05/24/20 0203  WBC 7.4  HGB 10.1*  HCT 32.0*  MCV 71.9*  PLT 107*   Cardiac Enzymes: No results for input(s): CKTOTAL, CKMB, CKMBINDEX, TROPONINI in the last 168 hours. BNP (last 3 results) Recent Labs    01/22/20 0410 02/24/20 0030 05/08/20 0827  BNP 697.9* 184.7* 48.2    ProBNP (last 3 results) No results for input(s): PROBNP in the last 8760 hours.  CBG: No results for input(s): GLUCAP in the last 168 hours.  No results found for this or any previous visit (from the past 240 hour(s)).   Studies: No results found.  Scheduled Meds: . atorvastatin  40 mg Oral Daily  . busPIRone  10 mg Oral TID  . carvedilol  3.125 mg Oral BID WC  . chlorhexidine  15 mL Mouth Rinse BID  . digoxin  0.125 mg Oral Daily  . fluticasone furoate-vilanterol  1 puff Inhalation Daily  . furosemide  40  mg Oral Daily  . magnesium oxide  400 mg Oral Daily  . mouth rinse  15 mL Mouth Rinse q12n4p  . phenytoin  300 mg Oral BID  . pregabalin  75 mg Oral TID  . QUEtiapine  200 mg Oral Daily  . sacubitril-valsartan  1 tablet Oral BID   Continuous Infusions:   Principal Problem:   Hyponatremia Active Problems:   Chronic systolic CHF (congestive heart failure) (HCC)   Mixed hyperlipidemia   COPD (chronic obstructive pulmonary disease) (HCC)   Seizure disorder (HCC)   Paranoid schizophrenia  (HCC)   Hypokalemia   Hypomagnesemia   Suicidal ideation   Noncompliance with medications   Subtherapeutic serum dilantin level   Seizure (HCC)        Consultants:  Dentist/oral surgeon  Procedures:  None  Antibiotics: Anti-infectives (From admission, onward)   Start     Dose/Rate Route Frequency Ordered Stop   05/02/20 1100  amoxicillin-clavulanate (AUGMENTIN) 875-125 MG per tablet 1 tablet  Status:  Discontinued        1 tablet Oral Every 12 hours 05/02/20 1048 05/02/20 1048   05/02/20 1100  amoxicillin-clavulanate (AUGMENTIN) 875-125 MG per tablet 1 tablet        1 tablet Oral Every 12 hours 05/02/20 1048 05/03/20 2143   04/29/20 0800  ceFAZolin (ANCEF) IVPB 2g/100 mL premix        2 g 200 mL/hr over 30 Minutes Intravenous On call to O.R. 04/27/20 1814 04/29/20 0918   04/24/20 2100  Ampicillin-Sulbactam (UNASYN) 3 g in sodium chloride 0.9 % 100 mL IVPB  Status:  Discontinued        3 g 200 mL/hr over 30 Minutes Intravenous Every 6 hours 04/24/20 1936 05/02/20 1048   04/02/20 2200  amoxicillin-clavulanate (AUGMENTIN) 875-125 MG per tablet 1 tablet        1 tablet Oral Every 12 hours 04/02/20 1405 04/04/20 2130   03/29/20 0930  Ampicillin-Sulbactam (UNASYN) 3 g in sodium chloride 0.9 % 100 mL IVPB  Status:  Discontinued        3 g 200 mL/hr over 30 Minutes Intravenous Every 6 hours 03/29/20 0915 04/02/20 1405       Time spent: 25 minutes    Junious Silk ANP  [facility] Pager [phone]. If 7PM-7AM, please contact night-coverage at [email or web address] 05/26/2020, 12:45 PM  LOS: 93 days

## 2020-05-27 DIAGNOSIS — F4312 Post-traumatic stress disorder, chronic: Secondary | ICD-10-CM

## 2020-05-27 NOTE — [place] FL2 (Signed)
[place] MEDICAID FL2 LEVEL OF CARE SCREENING TOOL     IDENTIFICATION  Patient Name: Jim Dunn Birthdate: Jan 18, 1966 Sex: male Admission Date (Current Location): 02/23/2020  [facility] and [place] Number:   Programme researcher, broadcasting/film/video)   Facility and Address:  [facility], [address], [place], [place] [address]      Provider Number: [id number]  Attending Physician Name and Address:  Osvaldo Shipper, MD   Relative Name and Phone Number:       Current Level of Care: Hospital Recommended Level of Care: Assisted Living Facility Prior Approval Number:    Date Approved/Denied:   PASRR Number:    Discharge Plan: Other (Comment) (ALF)    Current Diagnoses: Patient Active Problem List   Diagnosis Date Noted  . [name] Major neurocognitive disorder (dementia nonprogressive) Chronic post-traumatic stress disorder (PTSD)   . Noncompliance with medications   . Subtherapeutic serum dilantin level   . Seizure (HCC)   . Hypomagnesemia 02/23/2020  . Suicidal ideation 02/23/2020  . Hyponatremia 02/19/2020  . Paranoid schizophrenia (HCC)   . Hypokalemia   . Chronic systolic CHF (congestive heart failure) (HCC) 01/22/2020  . Mixed hyperlipidemia 01/22/2020  . COPD (chronic obstructive pulmonary disease) (HCC) 01/22/2020  . Microcytic anemia 01/22/2020  . Chronic hyponatremia 01/22/2020  . Seizure disorder (HCC)    Orientation RESPIRATION BLADDER Height & Weight     Self, Time, Situation, Place  Normal Continent Weight: 218 lb 14.4 oz (99.3 kg) Height:  5\' 8"  (172.7 cm)  BEHAVIORAL SYMPTOMS/MOOD NEUROLOGICAL BOWEL NUTRITION STATUS      Continent Diet (heart healthy)  AMBULATORY STATUS COMMUNICATION OF NEEDS Skin   Supervision Verbally Normal                       Personal Care Assistance Level of Assistance  Bathing, Feeding, Dressing Bathing Assistance: Limited assistance Feeding assistance: Independent Dressing Assistance: Limited assistance     Functional  Limitations Info             SPECIAL CARE FACTORS FREQUENCY                       Contractures Contractures Info: Not present    Additional Factors Info  Code Status, Allergies, Psychotropic Code Status Info: Full Allergies Info: NKA Psychotropic Info: Buspar 10mg  3x/day; Seroquel 200mg  daily; Klonopin .25mg  daily         Current Medications (05/27/2020):  This is the current hospital active medication list Current Facility-Administered Medications  Medication Dose Route Frequency Provider Last Rate Last Admin  . acetaminophen (TYLENOL) tablet 650 mg  650 mg Oral Q4H PRN , DDS   650 mg at 05/25/20 2117  . atorvastatin (LIPITOR) tablet 40 mg  40 mg Oral Daily 05/29/2020 F, DDS   40 mg at 05/27/20 1153  . busPIRone (BUSPAR) tablet 10 mg  10 mg Oral TID 05/27/20, DDS   10 mg at 05/27/20 1153  . carvedilol (COREG) tablet 3.125 mg  3.125 mg Oral BID WC Cindra Eves F, DDS   3.125 mg at 05/27/20 1152  . chlorhexidine (PERIDEX) 0.12 % solution 15 mL  15 mL Mouth Rinse BID [name], MD   15 mL at 05/27/20 1151  . clonazePAM (KLONOPIN) disintegrating tablet 0.5 mg  0.5 mg Oral BID PRN Cindra Eves, MD   0.5 mg at 05/26/20 Marinda Elk  . digoxin (LANOXIN) tablet 0.125 mg  0.125 mg Oral Daily Kulinski,  Derrell Lolling, DDS   0.125 mg at 05/27/20 1152  . fluticasone furoate-vilanterol (BREO ELLIPTA) 200-25 MCG/INH 1 puff  1 puff Inhalation Daily Cindra Eves F, DDS   1 puff at 05/27/20 0736  . furosemide (LASIX) tablet 40 mg  40 mg Oral Daily Russella Dar, NP   40 mg at 05/27/20 1153  . Ipratropium-Albuterol (COMBIVENT) respimat 1 puff  1 puff Inhalation Q6H PRN Russella Dar, NP      . magnesium oxide (MAG-OX) tablet 400 mg  400 mg Oral Daily Noralee Stain, DO   400 mg at 05/27/20 1152  . MEDLINE mouth rinse  15 mL Mouth Rinse q12n4p Marinda Elk, MD   15 mL at 05/27/20 1154  . melatonin tablet 3 mg  3 mg Oral QHS PRN Noralee Stain, DO   3 mg at 05/25/20 2118  . ondansetron (ZOFRAN) tablet 4 mg  4 mg Oral Q6H PRN [name], DDS       Or  . ondansetron [facility]) injection 4 mg  4 mg Intravenous Q6H PRN [name], DDS      . phenytoin (DILANTIN) ER capsule 300 mg  300 mg Oral BID Cindra Eves F, DDS   300 mg at 05/27/20 1151  . polyethylene glycol (MIRALAX / GLYCOLAX) packet 17 g  17 g Oral Daily PRN [name], DDS   17 g at 04/02/20 2102  . pregabalin (LYRICA) capsule 75 mg  75 mg Oral TID [name], DDS   75 mg at 05/27/20 1154  . QUEtiapine (SEROQUEL) tablet 200 mg  200 mg Oral Daily Cindra Eves F, DDS   200 mg at 05/27/20 1153  . sacubitril-valsartan (ENTRESTO) 24-26 mg per tablet  1 tablet Oral BID Cindra Eves F, DDS   1 tablet at 05/27/20 1152  . zolpidem (AMBIEN) tablet 10 mg  10 mg Oral QHS PRN Russella Dar, NP   10 mg at 05/26/20 2203     Discharge Medications: Please see discharge summary for a list of discharge medications.  Relevant Imaging Results:  Relevant Lab Results:   Additional Information SS#: [id number]  Baldemar Lenis, LCSW
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
## 2020-05-27 NOTE — Plan of Care (Signed)
Problem: Education: Goal: Expressions of having a comfortable level of knowledge regarding the disease process will increase 05/27/2020 1943 by Jasaun Carn, Lloyd Huger, RN Outcome: Progressing 05/27/2020 1942 by Marcellene Shivley, Lloyd Huger, RN Outcome: Progressing   Problem: Health Behavior/Discharge Planning: Goal: Compliance with prescribed medication regimen will improve 05/27/2020 1943 by Adalene Gulotta, Lloyd Huger, RN Outcome: Progressing 05/27/2020 1942 by Luanne Krzyzanowski, Lloyd Huger, RN Outcome: Progressing   Problem: Medication: Goal: Risk for medication side effects will decrease 05/27/2020 1943 by Sneijder Bernards, Lloyd Huger, RN Outcome: Progressing 05/27/2020 1942 by Malinda Mayden, Lloyd Huger, RN Outcome: Progressing   Problem: Clinical Measurements: Goal: Complications related to the disease process, condition or treatment will be avoided or minimized 05/27/2020 1943 by Jenni Thew, Lloyd Huger, RN Outcome: Progressing 05/27/2020 1942 by Lethea Killings, RN Outcome: Progressing Goal: Diagnostic test results will improve 05/27/2020 1943 by Terree Gaultney, Lloyd Huger, RN Outcome: Progressing 05/27/2020 1942 by Bassel Gaskill, Lloyd Huger, RN Outcome: Progressing   Problem: Safety: Goal: Verbalization of understanding the information provided will improve 05/27/2020 1943 by Patton Rabinovich, Lloyd Huger, RN Outcome: Progressing 05/27/2020 1942 by Jansen Goodpasture, Lloyd Huger, RN Outcome: Progressing   Problem: Self-Concept: Goal: Level of anxiety will decrease 05/27/2020 1943 by Kalissa Grays, Lloyd Huger, RN Outcome: Progressing 05/27/2020 1942 by Scotlynn Noyes, Lloyd Huger, RN Outcome: Progressing Goal: Ability to verbalize feelings about condition will improve 05/27/2020 1943 by Jarely Juncaj, Lloyd Huger, RN Outcome: Progressing 05/27/2020 1942 by Laiya Wisby, Lloyd Huger, RN Outcome: Progressing   Problem: Education: Goal: Knowledge of General Education information will improve Description: Including pain rating scale, medication(s)/side effects and non-pharmacologic comfort  measures 05/27/2020 1943 by Dyke Weible, Lloyd Huger, RN Outcome: Progressing 05/27/2020 1942 by Nely Dedmon, Lloyd Huger, RN Outcome: Progressing   Problem: Health Behavior/Discharge Planning: Goal: Ability to manage health-related needs will improve 05/27/2020 1943 by Mavis Gravelle, Lloyd Huger, RN Outcome: Progressing 05/27/2020 1942 by Arya Boxley, Lloyd Huger, RN Outcome: Progressing   Problem: Clinical Measurements: Goal: Ability to maintain clinical measurements within normal limits will improve 05/27/2020 1943 by Dessiree Sze, Lloyd Huger, RN Outcome: Progressing 05/27/2020 1942 by Stanlee Roehrig, Lloyd Huger, RN Outcome: Progressing Goal: Will remain Torren Maffeo from infection 05/27/2020 1943 by Corissa Oguinn, Lloyd Huger, RN Outcome: Progressing 05/27/2020 1942 by Lethea Killings, RN Outcome: Progressing Goal: Diagnostic test results will improve 05/27/2020 1943 by Ramsey Guadamuz, Lloyd Huger, RN Outcome: Progressing 05/27/2020 1942 by Lethea Killings, RN Outcome: Progressing Goal: Respiratory complications will improve 05/27/2020 1943 by Saurabh Hettich, Lloyd Huger, RN Outcome: Progressing 05/27/2020 1942 by Lethea Killings, RN Outcome: Progressing Goal: Cardiovascular complication will be avoided 05/27/2020 1943 by Numa Heatwole, Lloyd Huger, RN Outcome: Progressing 05/27/2020 1942 by Anisah Kuck, Lloyd Huger, RN Outcome: Progressing   Problem: Activity: Goal: Risk for activity intolerance will decrease 05/27/2020 1943 by Marcelis Wissner, Lloyd Huger, RN Outcome: Progressing 05/27/2020 1942 by Anjela Cassara, Lloyd Huger, RN Outcome: Progressing   Problem: Nutrition: Goal: Adequate nutrition will be maintained 05/27/2020 1943 by Rasheedah Reis, Lloyd Huger, RN Outcome: Progressing 05/27/2020 1942 by [name], Lloyd Huger, RN Outcome: Progressing   Problem: Coping: Goal: Level of anxiety will decrease 05/27/2020 1943 by Amneet Cendejas, Lloyd Huger, RN Outcome: Progressing 05/27/2020 1942 by Avaiah Stempel, Lloyd Huger, RN Outcome: Progressing   Problem: Elimination: Goal: Will not  experience complications related to bowel motility 05/27/2020 1943 by Sebastain Fishbaugh, Lloyd Huger, RN Outcome: Progressing 05/27/2020 1942 by Denelle Capurro, Lloyd Huger, RN Outcome: Progressing Goal: Will not experience complications related to urinary retention 05/27/2020 1943 by Michalla Ringer, Lloyd Huger, RN Outcome: Progressing 05/27/2020 1942 by Brighten Orndoff, Lloyd Huger, RN Outcome: Progressing   Problem: Pain Managment: Goal: General experience  of comfort will improve 05/27/2020 1943 by Dashawn Golda, Lloyd Huger, RN Outcome: Progressing 05/27/2020 1942 by Kelissa Merlin, Lloyd Huger, RN Outcome: Progressing   Problem: Safety: Goal: Ability to remain Kito Cuffe from injury will improve 05/27/2020 1943 by Kimberly Coye, Lloyd Huger, RN Outcome: Progressing 05/27/2020 1942 by Mimi Debellis, Lloyd Huger, RN Outcome: Progressing   Problem: Skin Integrity: Goal: Risk for impaired skin integrity will decrease 05/27/2020 1943 by Parks Czajkowski, Lloyd Huger, RN Outcome: Progressing 05/27/2020 1942 by Marshall Roehrich, Lloyd Huger, RN Outcome: Progressing

## 2020-05-27 NOTE — Progress Notes (Addendum)
[facility] PROGRESS NOTE  Jim Dunn MBT:[id number] DOB: [birth year] DOA: 02/23/2020 PCP: Pcp, No  Status: Inpatient---Remains inpatient appropriate because:Altered mental status and Unsafe d/c plan   Dispo: The patient is from: Home              Anticipated d/c is to: ALF               Anticipated d/c date is: > 3 days              Patient currently is not medically stable to d/c. **NEEDS SAFE DC PLAN** Currently awaiting placement, TOC team working on this.  Mother is currently working to obtain Medicaid/guardianship. Unsafe for discharge based on underlying schizophrenia and history of recurrent noncompliance which led to patient's current hospitalization.  As of 9/17 given patient's normal MMSE in the context of an abnormal SBT, a psychiatric reevaluation has been requested to determine if patient now appropriate to discharge to independent living in a local apartment.  Based on clinical evaluation as well as screening tests during the hospitalization patient appears to have neurocognitive abnormalities of uncertain etiology.  Formal neuropsychiatric consultation completed on 9/23 with patient demonstrating multiple cognitive deficits or impairments particularly related to inability to store and organize newly learned information. This evaluation is consistent with a diagnosis of dementia. Also has known schizophrenia and clinical features of PTSD.  Code Status: Full Family Communication: 9/15 updated [name] [phone] or [phone] was also at bedside on 9/17 and spoke extensively with OT during their evaluation DVT prophylaxis: 8/5 refused Lovenox therefore switched to SCDs since ambulating without difficulty Vaccination status: Vaccinated   HPI (time of admission): 54 year old male with past medical history of schizophrenia, seizure disorder, biventricular heart failure(Echo 01/2020 EF 25-30%with right-sided heart failure),severe tricuspid  regurgitation, nicotine dependence and COPD who presents to [facility] emergency department with suicidal ideation and seizure.Patient explains that since he left the hospital several days ago(admitted6/17-6/19-was being managed for hyponatremia, thought to be multifactorial in origin. Patient left AMA on6/19 and that time had sodium of 132),he has been experiencing increasing fatigue. This increasing fatigue is associated with bilateral lower extremity tingling. Symptomsweresimilar to what prompted his presentation on 6/17. Additionally, he reported feeling depressed withsuicidal ideation, withouta plan. Patient reportedthat he hadattempted suicide in the distant past using pills but was unsuccessful and never sought medical attention. While the patient was waiting in the waiting room, he experienced a witnessed seizure while attempting to walk to the bathroom. Patient was immediately brought back to the emergency department for further evaluation. Patient was found to have substantial recurrent hyponatremia of 117 with concurrent hypokalemia of 2.6 and hypomagnesium of 1.4. Patient was administered 1236mg  of intravenous Fosphenytoinand initiated on low rate normal saline infusion. Patient was administered 2 g of intravenous magnesium and a 30 mEq of potassium.  Patient's electrolytes have improved, no further seizure episodes at this time.  Echo showed EF of 25-30%, cardiology recommended outpatient follow-up but otherwise continue Coreg, torsemide and Entresto.   In addition to the above patient was noted to have significant difficulties with impaired memory and consistent application of skills necessary to perform IADLs in the outpatient setting. Screening confirmed suspicions. On arrival psychological evaluation completed on 9/23 which confirms a diagnosis of dementia as well as PTSD with recommendation for skilled nursing to ensure patient's safety and stability.    Subjective: Patient alert and without complaints.  States "that man came by and talked with me yesterday "in regards to  neuropsych evaluation.  He reports that that individual return today (I presume for additional assessment and possible neurocognitive evaluation/testing)  Objective: Vitals:   05/27/20 0731 05/27/20 0736  BP: 110/75   Pulse: 72 79  Resp: 16 16  Temp: 98 F (36.7 C)   SpO2: 96% 94%    Intake/Output Summary (Last 24 hours) at 05/27/2020 0911 Last data filed at 05/26/2020 1500 Gross per 24 hour  Intake 320 ml  Output --  Net 320 ml   Filed Weights   05/25/20 0645 05/26/20 0547 05/27/20 0646  Weight: 98.2 kg 99.5 kg 99.3 kg    Exam: Constitutional: NAD, pleasant and nonanxious. Respiratory: clear to auscultation bilaterally on posterior exam, Normal respiratory effort at rest.  Room air Cardiovascular: Regular rate, no murmurs / rubs / gallops. No lower extremity edema.  No JVD.  Abdomen: no tenderness, Bowel sounds positive.  Increasing abdominal girth Neurologic: CN 2-12 grossly intact. Sensation intact,Strength 5/5 x all 4 extremities. Ambulates independently but does have some balance difficulties when initially getting out of bed while sleepy as documented by PT/OT Psychiatric: alert and oriented x name, year and place although does not have appropriate insight or capacity to manage care independently   Assessment/Plan: Hyponatremia with seizures/multifactorial etiology: 1. Intravascular dehydration, resolved 2.  SIADH 3.  Chronic systolic heart failure intermittent volume overload At time of admission diuretics torsemide & metolazone were held and subsequently  discontinued.   Weight had gone up and suspected evolving heart failure therefore Lasix was increased to BID dosing x 3 days and transitioned to 60 mg daily.   9/2 sodium down to 126-weight up slightly but does not appear consistent with heart failure therefore have decreased Lasix back to 40 mg  daily Week of 9/4: Due to persistent hyponatremia covering attending physician obtained SIADH labs which confirmed SIADH physiology.  Lasix was discontinued.  Placed on fluid restriction which patient has been inconsistent in following -sodium subsequently decreased despite FR.  Weight has also increased. 9/13 given increasing weight and decreasing sodium over 24 hours despite adhering to 1200 cc FR have opted to resume low-dose Lasix 40 mg daily 9/14 after resuming Lasix sodium has increased to 130 and as of 9/15 was 129 As of 9/22 sodium stable at 125 Suspect patient's baseline sodium will be in the 120s  Chronic congestive heart failure with reduced ejection fraction, 25%.-Associated right heart failure.   Lasix decreased to daily but baseline dosage increased from 40 mg to 60 mg given recent treatment for suspected evolving CHF exacerbation-see above regarding sodium and Lasix adjustments-current dose down to 40 mg daily as above Echo May 2021-EF 25-30% Continue Coreg, Entresto, Lasix along with Daily weights with strict I/O  Unable to uptitrate Entresto due to lower range BP readings Counseled extensively by multiple providers regarding need to adhere to salt restricted diet well as fluid restriction but patient remains noncompliant Cardiology/Dr. Sharyn Lull recommended cardiac follow-up after discharge and likely will need follow-up echo-if EF remains 30% or less would need to be referred to EP clinic for consideration for ICD implantation 9/17 asked staff to begin educating patient regarding management of CHF.   Dry weight unclear but appears to be between 216 and 220 pounds.  Due to weight gain was given extra Lasix w/ subsequent decrease in sodium to 125 with no significant change in weight.  Of 9/22 sodium remains stable at 125  Depression/paranoid schizophrenia, Suicidal ideations--Functional Dementia: Resolved, seen by psychiatry early during the hospitalization.  Recommended to continue  BuSpar, Invega and Seroquel.   Follow-up with outpatient psychiatry after.   Continues with a pattern of increasing anxiety 1 week prior to repeat dosing of Invega which is responsive to prn Klonopin  Scored very well on MMSE testing (30) but scored poorly on Short Blessed Test which is consistent w/ functional short term memory deficits c/w a dementia diagnosis.  Appreciate psychiatry evaluation-see note for details-patient has capacity but lacks judgment -given abnormal SBT neuropsychiatric evaluation in process Formal neuro psychiatric cognitive evaluation completed on 9/23 with confirmation of a diagnosis of dementia as well as PTSD in addition to known schizophrenia. Recommendation is for skilled nursing.  Recurrent dental abscesses status post full dental extraction on 8/26 Recurrent.  Initial episode in July that responded to IV antibiotics recurrent episode in August.  Both with confirmed dental abscess orthopantogram in both episodes responsive to IV Unasyn. Status post full dental extraction on 8/26.  Need to follow-up with dental surgery after discharge to ensure appropriate wound healing and evaluation for dentures  Insomnia Unclear if related to volume overload and patient not perceptive that respiratory symptoms may be contributing-treating volume overload as above Patient requested Lunesta which is nonformulary We will increase Ambien from 5 mg to 10 mg as needed Continue melatonin as well  Seizure disorder: Stable Secondary to hyponatremia and issues of nonadherence with medications prior to admission.   Dilantin level subtherapeutic on 7/18 at 2.6 with dosage adjustments made several times during the admission based on Dilantin level 8/11 Dilantin level therapeutic at 11.7 -continue current dose 8/27 follow-up Dilantin level 12.9-unless showed signs of toxicity no indication to repeat Dilantin level for at least 6 weeks  Microcytic anemia Iron studies relatively normal,  hemoglobin stable If has significant bleeding postoperatively may need to repeat CBC for several days.  OSA Continue nocturnal CPAP-changing to different mask briefly improve CPAP compliance but currently is not wearing consistently  COPD: stable. As needed bronchodilators  Hyperlipidemia: Daily Lipitor 40 mg  Hypokalemia/hypomagnesemia Replace as needed  Peripheral neuropathy Changed gabapentin to Lyrica 50 mg TID and subsequently titrated up to 75 mg Repeat B12 level 401  Modest obesity Estimated body mass index is 33.28 kg/m as calculated from the following:   Height as of this encounter: 5\' 8"  (1.727 m).   Weight as of this encounter: 99.3 kg.      Data Reviewed: Basic Metabolic Panel: Recent Labs  Lab 05/20/20 0946 05/22/20 0149 05/24/20 0203 05/26/20 0327  NA 128* 126* 125* 125*  K 4.1 4.1 4.6 4.3  CL 93* 95* 93* 95*  CO2 26 23 24 24   GLUCOSE 93 92 112* 94  BUN 8 10 14 10   CREATININE 0.60* 0.47* 0.62 0.55*  CALCIUM 9.5 8.9 8.8* 9.0   Liver Function Tests: No results for input(s): AST, ALT, ALKPHOS, BILITOT, PROT, ALBUMIN in the last 168 hours. No results for input(s): LIPASE, AMYLASE in the last 168 hours. No results for input(s): AMMONIA in the last 168 hours. CBC: Recent Labs  Lab 05/24/20 0203  WBC 7.4  HGB 10.1*  HCT 32.0*  MCV 71.9*  PLT 107*   Cardiac Enzymes: No results for input(s): CKTOTAL, CKMB, CKMBINDEX, TROPONINI in the last 168 hours. BNP (last 3 results) Recent Labs    01/22/20 0410 02/24/20 0030 05/08/20 0827  BNP 697.9* 184.7* 48.2    ProBNP (last 3 results) No results for input(s): PROBNP in the last 8760 hours.  CBG: No results for input(s): GLUCAP in the last 168 hours.  No results found for this or any previous visit (from the past 240 hour(s)).   Studies: No results found.  Scheduled Meds: . atorvastatin  40 mg Oral Daily  . busPIRone  10 mg Oral TID  . carvedilol  3.125 mg Oral BID WC  .  chlorhexidine  15 mL Mouth Rinse BID  . digoxin  0.125 mg Oral Daily  . fluticasone furoate-vilanterol  1 puff Inhalation Daily  . furosemide  40 mg Oral Daily  . magnesium oxide  400 mg Oral Daily  . mouth rinse  15 mL Mouth Rinse q12n4p  . phenytoin  300 mg Oral BID  . pregabalin  75 mg Oral TID  . QUEtiapine  200 mg Oral Daily  . sacubitril-valsartan  1 tablet Oral BID   Continuous Infusions:   Principal Problem:   Hyponatremia Active Problems:   Chronic systolic CHF (congestive heart failure) (HCC)   Mixed hyperlipidemia   COPD (chronic obstructive pulmonary disease) (HCC)   Seizure disorder (HCC)   Paranoid schizophrenia (HCC)   Hypokalemia   Hypomagnesemia   Suicidal ideation   Noncompliance with medications   Subtherapeutic serum dilantin level   Seizure (HCC)   Chronic post-traumatic stress disorder (PTSD)        Consultants:  Dentist/oral surgeon  Procedures:  None  Antibiotics: Anti-infectives (From admission, onward)   Start     Dose/Rate Route Frequency Ordered Stop   05/02/20 1100  amoxicillin-clavulanate (AUGMENTIN) 875-125 MG per tablet 1 tablet  Status:  Discontinued        1 tablet Oral Every 12 hours 05/02/20 1048 05/02/20 1048   05/02/20 1100  amoxicillin-clavulanate (AUGMENTIN) 875-125 MG per tablet 1 tablet        1 tablet Oral Every 12 hours 05/02/20 1048 05/03/20 2143   04/29/20 0800  ceFAZolin (ANCEF) IVPB 2g/100 mL premix        2 g 200 mL/hr over 30 Minutes Intravenous On call to O.R. 04/27/20 1814 04/29/20 0918   04/24/20 2100  Ampicillin-Sulbactam (UNASYN) 3 g in sodium chloride 0.9 % 100 mL IVPB  Status:  Discontinued        3 g 200 mL/hr over 30 Minutes Intravenous Every 6 hours 04/24/20 1936 05/02/20 1048   04/02/20 2200  amoxicillin-clavulanate (AUGMENTIN) 875-125 MG per tablet 1 tablet        1 tablet Oral Every 12 hours 04/02/20 1405 04/04/20 2130   03/29/20 0930  Ampicillin-Sulbactam (UNASYN) 3 g in sodium chloride 0.9 %  100 mL IVPB  Status:  Discontinued        3 g 200 mL/hr over 30 Minutes Intravenous Every 6 hours 03/29/20 0915 04/02/20 1405       Time spent: 25 minutes    Junious Silk ANP  [facility] Pager [phone]. If 7PM-7AM, please contact night-coverage at [email or web address] 05/27/2020, 9:11 AM  LOS: 94 days

## 2020-05-28 NOTE — Progress Notes (Signed)
[facility] PROGRESS NOTE  Jim Dunn WCH:[id number] DOB: November 18, 1965 DOA: 02/23/2020 PCP: Pcp, No  Status: Inpatient---Remains inpatient appropriate because:Altered mental status and Unsafe d/c plan   Dispo: The patient is from: Home              Anticipated d/c is to: ALF               Anticipated d/c date is: > 3 days              Patient currently is not medically stable to d/c. **NEEDS SAFE DC PLAN** Currently awaiting placement, TOC team working on this.  Mother is currently working to obtain Medicaid/guardianship. Unsafe for discharge based on underlying schizophrenia and history of recurrent noncompliance which led to patient's current hospitalization.  As of 9/17 given patient's normal MMSE in the context of an abnormal SBT, a psychiatric reevaluation has been requested to determine if patient now appropriate to discharge to independent living in a local apartment.  Based on clinical evaluation as well as screening tests during the hospitalization patient appears to have neurocognitive abnormalities of uncertain etiology.  Formal neuropsychiatric consultation completed on 9/23 with patient demonstrating multiple cognitive deficits or impairments particularly related to inability to store and organize newly learned information. This evaluation is consistent with a diagnosis of dementia. Also has known schizophrenia and clinical features of PTSD.  Code Status: Full Family Communication: 9/24 updated patient's mother Jim Dunn [phone] or [phone] was also at bedside on 9/17 and spoke extensively with OT during their evaluation DVT prophylaxis: 8/5 refused Lovenox therefore switched to SCDs since ambulating without difficulty Vaccination status: Vaccinated   HPI (time of admission): 54 year old male with past medical history of schizophrenia, seizure disorder, biventricular heart failure(Echo 01/2020 EF 25-30%with right-sided heart failure),severe tricuspid  regurgitation, nicotine dependence and COPD who presents to [facility] emergency department with suicidal ideation and seizure.Patient explains that since he left the hospital several days ago(admitted6/17-6/19-was being managed for hyponatremia, thought to be multifactorial in origin. Patient left AMA on6/19 and that time had sodium of 132),he has been experiencing increasing fatigue. This increasing fatigue is associated with bilateral lower extremity tingling. Symptomsweresimilar to what prompted his presentation on 6/17. Additionally, he reported feeling depressed withsuicidal ideation, withouta plan. Patient reportedthat he hadattempted suicide in the distant past using pills but was unsuccessful and never sought medical attention. While the patient was waiting in the waiting room, he experienced a witnessed seizure while attempting to walk to the bathroom. Patient was immediately brought back to the emergency department for further evaluation. Patient was found to have substantial recurrent hyponatremia of 117 with concurrent hypokalemia of 2.6 and hypomagnesium of 1.4. Patient was administered 1236mg  of intravenous Fosphenytoinand initiated on low rate normal saline infusion. Patient was administered 2 g of intravenous magnesium and a 30 mEq of potassium.  Patient's electrolytes have improved, no further seizure episodes at this time.  Echo showed EF of 25-30%, cardiology recommended outpatient follow-up but otherwise continue Coreg, torsemide and Entresto.   In addition to the above patient was noted to have significant difficulties with impaired memory and consistent application of skills necessary to perform IADLs in the outpatient setting. Screening confirmed suspicions. On arrival psychological evaluation completed on 9/23 which confirms a diagnosis of dementia as well as PTSD with recommendation for skilled nursing to ensure patient's safety and stability.    Subjective: Patient alert.  Splane findings of neurocognitive testing wrote down the 2 new diagnosis for patient to give to  his mother per his request.  Objective: Vitals:   05/28/20 0715 05/28/20 0751  BP: (!) 143/86   Pulse: 71   Resp: 20   Temp: 98.5 F (36.9 C)   SpO2: 97% 93%    Intake/Output Summary (Last 24 hours) at 05/28/2020 1237 Last data filed at 05/28/2020 0900 Gross per 24 hour  Intake 1368 ml  Output --  Net 1368 ml   Filed Weights   05/26/20 0547 05/27/20 0646 05/28/20 0639  Weight: 99.5 kg 99.3 kg 97.9 kg    Exam: Constitutional: NAD, pleasant and nonanxious. Respiratory: clear to auscultation bilaterally on posterior exam, Normal respiratory effort at rest.  Room air Cardiovascular: Regular rate, no murmurs / rubs / gallops. No lower extremity edema.  No JVD.  Abdomen: no tenderness, Bowel sounds positive.  Increasing abdominal girth Neurologic: CN 2-12 grossly intact. Sensation intact,Strength 5/5 x all 4 extremities. Ambulates independently but does have some balance difficulties when initially getting out of bed while sleepy as documented by PT/OT Psychiatric: alert and oriented x name, year and place although does not have appropriate insight or capacity to manage care independently   Assessment/Plan: Hyponatremia with seizures/multifactorial etiology: 1. Intravascular dehydration, resolved 2.  SIADH 3.  Chronic systolic heart failure intermittent volume overload At time of admission diuretics torsemide & metolazone were held and subsequently  discontinued.   Weight had gone up and suspected evolving heart failure therefore Lasix was increased to BID dosing x 3 days and transitioned to 60 mg daily.   9/2 sodium down to 126-weight up slightly but does not appear consistent with heart failure therefore have decreased Lasix back to 40 mg daily Week of 9/4: Due to persistent hyponatremia covering attending physician obtained SIADH labs which confirmed  SIADH physiology.  Lasix was discontinued.  Placed on fluid restriction which patient has been inconsistent in following -sodium subsequently decreased despite FR.  Weight has also increased. 9/13 given increasing weight and decreasing sodium over 24 hours despite adhering to 1200 cc FR have opted to resume low-dose Lasix 40 mg daily 9/14 after resuming Lasix sodium has increased to 130 and as of 9/15 was 129 As of 9/22 sodium stable at 125 Suspect patient's baseline sodium will be in the 120s  Chronic congestive heart failure with reduced ejection fraction, 25%.-Associated right heart failure.   Lasix decreased to daily but baseline dosage increased from 40 mg to 60 mg given recent treatment for suspected evolving CHF exacerbation-see above regarding sodium and Lasix adjustments-current dose down to 40 mg daily as above Echo May 2021-EF 25-30% Continue Coreg, Entresto, Lasix along with Daily weights with strict I/O  Unable to uptitrate Entresto due to lower range BP readings Counseled extensively by multiple providers regarding need to adhere to salt restricted diet well as fluid restriction but patient remains noncompliant Cardiology/Dr. Sharyn Lull recommended cardiac follow-up after discharge and likely will need follow-up echo-if EF remains 30% or less would need to be referred to EP clinic for consideration for ICD implantation 9/17 asked staff to begin educating patient regarding management of CHF.   Dry weight unclear but appears to be between 216 and 220 pounds.  Due to weight gain was given extra Lasix w/ subsequent decrease in sodium to 125 with no significant change in weight.  Of 9/22 sodium remains stable at 125  Depression/paranoid schizophrenia/major neurocognitive disorder (dementia nonprogressive): Resolved, seen by psychiatry early during the hospitalization.  Recommended to continue BuSpar, Invega and Seroquel.   Follow-up with outpatient psychiatry after.  Continues with a  pattern of increasing anxiety 1 week prior to repeat dosing of Invega which is responsive to prn Klonopin  Scored very well on MMSE testing (30) but scored poorly on Short Blessed Test which is consistent w/ functional short term memory deficits c/w a dementia diagnosis.  Appreciate psychiatry evaluation-see note for details-patient has capacity but lacks judgment -given abnormal SBT neuropsychiatric evaluation in process Formal neuro psychiatric cognitive evaluation completed on 9/23 with confirmation of a diagnosis of dementia as well as PTSD in addition to known schizophrenia. Recommendation is for skilled nursing. **Mother requesting any necessary paperwork regarding these new diagnosis to submit to disability as well as Medicaid to determine if patient eligible for additional funding/increases in services.LCSW aware and will mail to patient's mother**  Recurrent dental abscesses status post full dental extraction on 8/26 Recurrent.  Initial episode in July that responded to IV antibiotics recurrent episode in August.  Both with confirmed dental abscess orthopantogram in both episodes responsive to IV Unasyn. Status post full dental extraction on 8/26.  Need to follow-up with dental surgery after discharge to ensure appropriate wound healing and evaluation for dentures  Insomnia Unclear if related to volume overload and patient not perceptive that respiratory symptoms may be contributing-treating volume overload as above Patient requested Lunesta which is nonformulary We will increase Ambien from 5 mg to 10 mg as needed Continue melatonin as well  Seizure disorder: Stable Secondary to hyponatremia and issues of nonadherence with medications prior to admission.   Dilantin level subtherapeutic on 7/18 at 2.6 with dosage adjustments made several times during the admission based on Dilantin level 8/11 Dilantin level therapeutic at 11.7 -continue current dose 8/27 follow-up Dilantin level  12.9-unless showed signs of toxicity no indication to repeat Dilantin level for at least 6 weeks  Microcytic anemia Iron studies relatively normal, hemoglobin stable If has significant bleeding postoperatively may need to repeat CBC for several days.  OSA Continue nocturnal CPAP-changing to different mask briefly improve CPAP compliance but currently is not wearing consistently  COPD: stable. As needed bronchodilators  Hyperlipidemia: Daily Lipitor 40 mg  Hypokalemia/hypomagnesemia Replace as needed  Peripheral neuropathy Changed gabapentin to Lyrica 50 mg TID and subsequently titrated up to 75 mg Repeat B12 level 401  Modest obesity Estimated body mass index is 32.83 kg/m as calculated from the following:   Height as of this encounter: 5\' 8"  (1.727 m).   Weight as of this encounter: 97.9 kg.      Data Reviewed: Basic Metabolic Panel: Recent Labs  Lab 05/22/20 0149 05/24/20 0203 05/26/20 0327  NA 126* 125* 125*  K 4.1 4.6 4.3  CL 95* 93* 95*  CO2 23 24 24   GLUCOSE 92 112* 94  BUN 10 14 10   CREATININE 0.47* 0.62 0.55*  CALCIUM 8.9 8.8* 9.0   Liver Function Tests: No results for input(s): AST, ALT, ALKPHOS, BILITOT, PROT, ALBUMIN in the last 168 hours. No results for input(s): LIPASE, AMYLASE in the last 168 hours. No results for input(s): AMMONIA in the last 168 hours. CBC: Recent Labs  Lab 05/24/20 0203  WBC 7.4  HGB 10.1*  HCT 32.0*  MCV 71.9*  PLT 107*   Cardiac Enzymes: No results for input(s): CKTOTAL, CKMB, CKMBINDEX, TROPONINI in the last 168 hours. BNP (last 3 results) Recent Labs    01/22/20 0410 02/24/20 0030 05/08/20 0827  BNP 697.9* 184.7* 48.2    ProBNP (last 3 results) No results for input(s): PROBNP in the last 8760 hours.  CBG: No results for input(s): GLUCAP in the last 168 hours.  No results found for this or any previous visit (from the past 240 hour(s)).   Studies: No results found.  Scheduled Meds: .  atorvastatin  40 mg Oral Daily  . busPIRone  10 mg Oral TID  . carvedilol  3.125 mg Oral BID WC  . chlorhexidine  15 mL Mouth Rinse BID  . digoxin  0.125 mg Oral Daily  . fluticasone furoate-vilanterol  1 puff Inhalation Daily  . furosemide  40 mg Oral Daily  . magnesium oxide  400 mg Oral Daily  . mouth rinse  15 mL Mouth Rinse q12n4p  . phenytoin  300 mg Oral BID  . pregabalin  75 mg Oral TID  . QUEtiapine  200 mg Oral Daily  . sacubitril-valsartan  1 tablet Oral BID   Continuous Infusions:   Principal Problem:   Hyponatremia Active Problems:   Chronic systolic CHF (congestive heart failure) (HCC)   Mixed hyperlipidemia   COPD (chronic obstructive pulmonary disease) (HCC)   Seizure disorder (HCC)   Paranoid schizophrenia (HCC)   Hypokalemia   Hypomagnesemia   Suicidal ideation   Noncompliance with medications   Subtherapeutic serum dilantin level   Seizure (HCC)   Chronic post-traumatic stress disorder (PTSD)        Consultants:  Dentist/oral surgeon  Procedures:  None  Antibiotics: Anti-infectives (From admission, onward)   Start     Dose/Rate Route Frequency Ordered Stop   05/02/20 1100  amoxicillin-clavulanate (AUGMENTIN) 875-125 MG per tablet 1 tablet  Status:  Discontinued        1 tablet Oral Every 12 hours 05/02/20 1048 05/02/20 1048   05/02/20 1100  amoxicillin-clavulanate (AUGMENTIN) 875-125 MG per tablet 1 tablet        1 tablet Oral Every 12 hours 05/02/20 1048 05/03/20 2143   04/29/20 0800  ceFAZolin (ANCEF) IVPB 2g/100 mL premix        2 g 200 mL/hr over 30 Minutes Intravenous On call to O.R. 04/27/20 1814 04/29/20 0918   04/24/20 2100  Ampicillin-Sulbactam (UNASYN) 3 g in sodium chloride 0.9 % 100 mL IVPB  Status:  Discontinued        3 g 200 mL/hr over 30 Minutes Intravenous Every 6 hours 04/24/20 1936 05/02/20 1048   04/02/20 2200  amoxicillin-clavulanate (AUGMENTIN) 875-125 MG per tablet 1 tablet        1 tablet Oral Every 12 hours  04/02/20 1405 04/04/20 2130   03/29/20 0930  Ampicillin-Sulbactam (UNASYN) 3 g in sodium chloride 0.9 % 100 mL IVPB  Status:  Discontinued        3 g 200 mL/hr over 30 Minutes Intravenous Every 6 hours 03/29/20 0915 04/02/20 1405       Time spent: 15 minutes    Junious Silk ANP  [facility] Pager [phone]. If 7PM-7AM, please contact night-coverage at [email or web address] 05/28/2020, 12:37 PM  LOS: 95 days

## 2020-05-29 NOTE — Progress Notes (Signed)
PROGRESS NOTE    Jim Dunn  VFI:[id number] DOB: [birth year] DOA: 02/23/2020 PCP: Pcp, No   Brief Narrative: See narrative from note performed by Junious Silk PA on 9/24 for detail of Hospital course.     Assessment & Plan:   Principal Problem:   Hyponatremia Active Problems:   Chronic systolic CHF (congestive heart failure) (HCC)   Mixed hyperlipidemia   COPD (chronic obstructive pulmonary disease) (HCC)   Seizure disorder (HCC)   Paranoid schizophrenia (HCC)   Hypokalemia   Hypomagnesemia   Suicidal ideation   Noncompliance with medications   Subtherapeutic serum dilantin level   Seizure (HCC)   Chronic post-traumatic stress disorder (PTSD)    1-Hyponatremia with seizure;  SIADD, HF Sodium stable.  continue with lasix.   2-Chronic CHF systolic.  Continue with lasix.      3-Depression, paranoid, dementia, PTSD.  Stable./  Continue with current med.   4-Dental abscess; treated,   Seizure;  On dilanti  COPD ; stable HLD; lipitor OSA   Nutrition Problem: Increased nutrient needs Etiology: post-op healing    Signs/Symptoms: estimated needs    Interventions: Magic cup, MVI  Estimated body mass index is 34.19 kg/m as calculated from the following:   Height as of this encounter: 5\' 8"  (1.727 m).   Weight as of this encounter: 102 kg.   DVT prophylaxis: SCD Code Status: Full code    Subjective: Alert, no new complaints.   Objective: Vitals:   05/29/20 0354 05/29/20 0604 05/29/20 0750 05/29/20 1202  BP: 106/64  105/69 140/80  Pulse: 67  79 77  Resp: 16  18 18   Temp: (!) 97.5 F (36.4 C)  (!) 97.4 F (36.3 C) 98 F (36.7 C)  TempSrc: Oral  Oral   SpO2: 100%  98% 97%  Weight:  102 kg    Height:        Intake/Output Summary (Last 24 hours) at 05/29/2020 1526 Last data filed at 05/28/2020 2030 Gross per 24 hour  Intake 460 ml  Output --  Net 460 ml   Filed Weights   05/27/20 0646 05/28/20 0639 05/29/20 0604  Weight: 99.3 kg  97.9 kg 102 kg    Examination:  General exam: Appears calm and comfortable  Respiratory system: Clear to auscultation. Respiratory effort normal. Cardiovascular system: S1 & S2 heard, RRR. No JVD, murmurs, rubs, gallops or clicks. No pedal edema. Gastrointestinal system: Abdomen is nondistended, soft and nontender. No organomegaly or masses felt. Normal bowel sounds heard. Central nervous system: Alert and oriented.   Data Reviewed: I have personally reviewed following labs and imaging studies  CBC: Recent Labs  Lab 05/24/20 0203  WBC 7.4  HGB 10.1*  HCT 32.0*  MCV 71.9*  PLT 107*   Basic Metabolic Panel: Recent Labs  Lab 05/24/20 0203 05/26/20 0327  NA 125* 125*  K 4.6 4.3  CL 93* 95*  CO2 24 24  GLUCOSE 112* 94  BUN 14 10  CREATININE 0.62 0.55*  CALCIUM 8.8* 9.0   GFR: Estimated Creatinine Clearance: 125 mL/min (A) (by C-G formula based on SCr of 0.55 mg/dL (L)). Liver Function Tests: No results for input(s): AST, ALT, ALKPHOS, BILITOT, PROT, ALBUMIN in the last 168 hours. No results for input(s): LIPASE, AMYLASE in the last 168 hours. No results for input(s): AMMONIA in the last 168 hours. Coagulation Profile: No results for input(s): INR, PROTIME in the last 168 hours. Cardiac Enzymes: No results for input(s): CKTOTAL, CKMB, CKMBINDEX, TROPONINI in the last 168 hours.  BNP (last 3 results) No results for input(s): PROBNP in the last 8760 hours. HbA1C: No results for input(s): HGBA1C in the last 72 hours. CBG: No results for input(s): GLUCAP in the last 168 hours. Lipid Profile: No results for input(s): CHOL, HDL, LDLCALC, TRIG, CHOLHDL, LDLDIRECT in the last 72 hours. Thyroid Function Tests: No results for input(s): TSH, T4TOTAL, FREET4, T3FREE, THYROIDAB in the last 72 hours. Anemia Panel: No results for input(s): VITAMINB12, FOLATE, FERRITIN, TIBC, IRON, RETICCTPCT in the last 72 hours. Sepsis Labs: No results for input(s): PROCALCITON, LATICACIDVEN  in the last 168 hours.  No results found for this or any previous visit (from the past 240 hour(s)).       Radiology Studies: No results found.      Scheduled Meds:  atorvastatin  40 mg Oral Daily   busPIRone  10 mg Oral TID   carvedilol  3.125 mg Oral BID WC   chlorhexidine  15 mL Mouth Rinse BID   digoxin  0.125 mg Oral Daily   fluticasone furoate-vilanterol  1 puff Inhalation Daily   furosemide  40 mg Oral Daily   magnesium oxide  400 mg Oral Daily   mouth rinse  15 mL Mouth Rinse q12n4p   phenytoin  300 mg Oral BID   pregabalin  75 mg Oral TID   QUEtiapine  200 mg Oral Daily   sacubitril-valsartan  1 tablet Oral BID   Continuous Infusions:   LOS: 96 days    Time spent: 15 minutes    Agam Tuohy A Kody Vigil, MD [facility]   If 7PM-7AM, please contact night-coverage [email or web address]  05/29/2020, 3:26 PM

## 2020-05-30 NOTE — Progress Notes (Signed)
PROGRESS NOTE    Jim Dunn  ZHY:[id number] DOB: 1965-10-09 DOA: 02/23/2020 PCP: Pcp, No   Brief Narrative: See narrative from note performed by Junious Silk PA on 9/24 for detail of Hospital course.     Assessment & Plan:   Principal Problem:   Hyponatremia Active Problems:   Chronic systolic CHF (congestive heart failure) (HCC)   Mixed hyperlipidemia   COPD (chronic obstructive pulmonary disease) (HCC)   Seizure disorder (HCC)   Paranoid schizophrenia (HCC)   Hypokalemia   Hypomagnesemia   Suicidal ideation   Noncompliance with medications   Subtherapeutic serum dilantin level   Seizure (HCC)   Chronic post-traumatic stress disorder (PTSD)    1-Hyponatremia with seizure;  SIADD, HF Sodium stable.  continue with lasix.  Check Bmet on Monday.   2-Chronic CHF systolic.  Continue with lasix.      3-Depression, paranoid, dementia, PTSD.  Stable./  Continue with current med.   4-Dental abscess; treated,   Seizure;  On dilanti  COPD ; stable HLD; lipitor OSA   Nutrition Problem: Increased nutrient needs Etiology: post-op healing    Signs/Symptoms: estimated needs    Interventions: Magic cup, MVI  Estimated body mass index is 34.29 kg/m as calculated from the following:   Height as of this encounter: 5\' 8"  (1.727 m).   Weight as of this encounter: 102.3 kg.   DVT prophylaxis: SCD Code Status: Full code    Subjective: Walking on the hall. No new complaints. He has been reading.   Objective: Vitals:   05/30/20 0339 05/30/20 0531 05/30/20 0739 05/30/20 0802  BP: 128/65  133/81   Pulse: 66  68   Resp: 16  16   Temp: 97.7 F (36.5 C)  97.6 F (36.4 C)   TempSrc: Oral  Oral   SpO2: 94%  100% 98%  Weight:  102.3 kg    Height:        Intake/Output Summary (Last 24 hours) at 05/30/2020 0844 Last data filed at 05/30/2020 0740 Gross per 24 hour  Intake 1200 ml  Output --  Net 1200 ml   Filed Weights   05/28/20 0639 05/29/20 0604  05/30/20 0531  Weight: 97.9 kg 102 kg 102.3 kg    Examination:  General exam: NAD Respiratory system: CTA Cardiovascular system: S 1, S 2 RRR Gastrointestinal system: BS present, soft, nt Central nervous system: Alert   Data Reviewed: I have personally reviewed following labs and imaging studies  CBC: Recent Labs  Lab 05/24/20 0203  WBC 7.4  HGB 10.1*  HCT 32.0*  MCV 71.9*  PLT 107*   Basic Metabolic Panel: Recent Labs  Lab 05/24/20 0203 05/26/20 0327  NA 125* 125*  K 4.6 4.3  CL 93* 95*  CO2 24 24  GLUCOSE 112* 94  BUN 14 10  CREATININE 0.62 0.55*  CALCIUM 8.8* 9.0   GFR: Estimated Creatinine Clearance: 125.3 mL/min (A) (by C-G formula based on SCr of 0.55 mg/dL (L)). Liver Function Tests: No results for input(s): AST, ALT, ALKPHOS, BILITOT, PROT, ALBUMIN in the last 168 hours. No results for input(s): LIPASE, AMYLASE in the last 168 hours. No results for input(s): AMMONIA in the last 168 hours. Coagulation Profile: No results for input(s): INR, PROTIME in the last 168 hours. Cardiac Enzymes: No results for input(s): CKTOTAL, CKMB, CKMBINDEX, TROPONINI in the last 168 hours. BNP (last 3 results) No results for input(s): PROBNP in the last 8760 hours. HbA1C: No results for input(s): HGBA1C in the last 72 hours.  CBG: No results for input(s): GLUCAP in the last 168 hours. Lipid Profile: No results for input(s): CHOL, HDL, LDLCALC, TRIG, CHOLHDL, LDLDIRECT in the last 72 hours. Thyroid Function Tests: No results for input(s): TSH, T4TOTAL, FREET4, T3FREE, THYROIDAB in the last 72 hours. Anemia Panel: No results for input(s): VITAMINB12, FOLATE, FERRITIN, TIBC, IRON, RETICCTPCT in the last 72 hours. Sepsis Labs: No results for input(s): PROCALCITON, LATICACIDVEN in the last 168 hours.  No results found for this or any previous visit (from the past 240 hour(s)).       Radiology Studies: No results found.      Scheduled Meds: . atorvastatin  40  mg Oral Daily  . busPIRone  10 mg Oral TID  . carvedilol  3.125 mg Oral BID WC  . chlorhexidine  15 mL Mouth Rinse BID  . digoxin  0.125 mg Oral Daily  . fluticasone furoate-vilanterol  1 puff Inhalation Daily  . furosemide  40 mg Oral Daily  . magnesium oxide  400 mg Oral Daily  . mouth rinse  15 mL Mouth Rinse q12n4p  . phenytoin  300 mg Oral BID  . pregabalin  75 mg Oral TID  . QUEtiapine  200 mg Oral Daily  . sacubitril-valsartan  1 tablet Oral BID   Continuous Infusions:   LOS: 97 days    Time spent: 15 minutes    Brion Hedges A Rayven Hendrickson, MD [facility]   If 7PM-7AM, please contact night-coverage [email or web address]  05/30/2020, 8:44 AM

## 2020-05-31 LAB — BASIC METABOLIC PANEL
Anion gap: 5 (ref 5–15)
BUN: 11 mg/dL (ref 6–20)
CO2: 24 mmol/L (ref 22–32)
Calcium: 9.1 mg/dL (ref 8.9–10.3)
Chloride: 97 mmol/L — ABNORMAL LOW (ref 98–111)
Creatinine, Ser: 0.54 mg/dL — ABNORMAL LOW (ref 0.61–1.24)
GFR calc Af Amer: >60 mL/min (ref >60)
GFR calc non Af Amer: >60 mL/min (ref >60)
Glucose, Bld: 92 mg/dL (ref 70–99)
Potassium: 3.9 mmol/L (ref 3.5–5.1)
Sodium: 126 mmol/L — ABNORMAL LOW (ref 135–145)

## 2020-05-31 NOTE — Progress Notes (Signed)
Occupational Therapy Treatment Patient Details Name: Jim Dunn MRN: [id number] DOB: Jul 26, 1966 Today's Date: 05/31/2020    History of present illness Pt is a 54 y/o male admitted secondary to hyponatremia and suicidal ideation. PMH includes CHF, COPD, seizures, and paranoid schizophrenia.    OT comments  Patient continues to make steady progress towards goals in skilled OT session. Patient's session encompassed ADLs at sink, functional mobility, and multi-step tasks in order to assess cognition. Pt was able to complete transfers, ADLs, functional mobility, and following of two-step commands at the mod I level. Pt was able to ambulate in the hallway without assist, and demonstrated no need to brace himself at the sink when completing oral care and washing his face. Pt is able to follow two-step commands with no cues to reorient to task, and is able to have a conversation while engaging in ambulation without any lack of attention or awareness noted. At time, pt is no longer in need of acute OT services, and has met all of his goals in his plan of care. Discharge plan remains appropriate (although pending placement), will sign off at this time.    Follow Up Recommendations  Supervision - Intermittent    Equipment Recommendations  None recommended by OT    Recommendations for Other Services      Precautions / Restrictions Precautions Precautions: Fall Restrictions Weight Bearing Restrictions: No       Mobility Bed Mobility Overal bed mobility: Modified Independent Bed Mobility: Supine to Sit;Sit to Supine              Transfers Overall transfer level: Modified independent   Transfers: Sit to/from Stand Sit to Stand: Modified independent (Device/Increase time)         General transfer comment: Multiple transfers in and out of bed, able to ambulate around hallway with no need of single point cane in session    Balance Overall balance assessment: Modified  Independent Sitting-balance support: No upper extremity supported;Feet supported Sitting balance-Leahy Scale: Good     Standing balance support: No upper extremity supported;During functional activity Standing balance-Leahy Scale: Good Standing balance comment: dynamically                           ADL either performed or assessed with clinical judgement   ADL Overall ADL's : Modified independent     Grooming: Wash/dry hands;Wash/dry face;Oral care               Lower Body Dressing: Supervision/safety Lower Body Dressing Details (indicate cue type and reason): donning shoes in standing Toilet Transfer: Ambulation;Supervision/safety Toilet Transfer Details (indicate cue type and reason): simulated with various transfers in session         Functional mobility during ADLs: Modified independent General ADL Comments: Pt at baseline from acute OT standpoint, will sign off     Vision       Perception     Praxis      Cognition Arousal/Alertness: Awake/alert Behavior During Therapy: WFL for tasks assessed/performed Overall Cognitive Status: Impaired/Different from baseline                                 General Comments: See latest psych note for further in-depth assessment on cognition, pt is now at baseline from an acute care standpoint        Exercises     Shoulder Instructions  General Comments      Pertinent Vitals/ Pain       Pain Assessment: No/denies pain  Home Living                                          Prior Functioning/Environment              Frequency  Min 2X/week        Progress Toward Goals  OT Goals(current goals can now be found in the care plan section)  Progress towards OT goals: Progressing toward goals  Acute Rehab OT Goals Patient Stated Goal: Im going to get some sleep and call in my lunch OT Goal Formulation: With patient Time For Goal Achievement:  06/04/20 Potential to Achieve Goals: Good  Plan All goals met and education completed, patient discharged from OT services    Co-evaluation                 AM-PAC OT "6 Clicks" Daily Activity     Outcome Measure   Help from another person eating meals?: None Help from another person taking care of personal grooming?: None Help from another person toileting, which includes using toliet, bedpan, or urinal?: None Help from another person bathing (including washing, rinsing, drying)?: None Help from another person to put on and taking off regular upper body clothing?: None Help from another person to put on and taking off regular lower body clothing?: None 6 Click Score: 24    End of Session    OT Visit Diagnosis: Unsteadiness on feet (R26.81)   Activity Tolerance Patient tolerated treatment well   Patient Left in bed;with call bell/phone within reach   Nurse Communication Mobility status;Precautions        Time: 4840-3979 OT Time Calculation (min): 10 min  Charges: OT General Charges $OT Visit: 1 Visit OT Treatments $Self Care/Home Management : 8-22 mins  Corinne [place]s [place]. [place], [place] [facility] [phone] [place] 05/31/2020, 11:59 AM

## 2020-05-31 NOTE — Progress Notes (Signed)
Pt puts self on/off cpap as needed. RT will continue to monitor.

## 2020-05-31 NOTE — Progress Notes (Signed)
[facility] PROGRESS NOTE  Jim Dunn FMB:[id number] DOB: [birth year] DOA: 02/23/2020 PCP: Pcp, No  Status: Inpatient---Remains inpatient appropriate because:Altered mental status and Unsafe d/c plan   Dispo: The patient is from: Home              Anticipated d/c is to: ALF               Anticipated d/c date is: > 3 days              Patient currently is not medically stable to d/c. **NEEDS SAFE DC PLAN** Currently awaiting placement, TOC team working on this.  Mother is currently working to obtain Medicaid/guardianship. Unsafe for discharge based on underlying schizophrenia and history of recurrent noncompliance which led to patient's current hospitalization.   Based on clinical evaluation as well as screening tests during the hospitalization patient appears to have neurocognitive abnormalities. Formal neuropsychiatric consultation completed on 9/23 with patient demonstrating multiple cognitive deficits/impairments particularly related to inability to store and organize newly learned information. This evaluation is consistent with a diagnosis of dementia. Also has known schizophrenia and clinical features of PTSD.  Code Status: Full Family Communication: 9/24 updated [name] [phone] or [phone]]-mother was also at bedside on 9/17 and spoke extensively with OT during their evaluation DVT prophylaxis: 8/5 refused Lovenox therefore switched to SCDs since ambulating without difficulty Vaccination status: Vaccinated   HPI (time of admission): 54 year old male with past medical history of schizophrenia, seizure disorder, biventricular heart failure(Echo 01/2020 EF 25-30%with right-sided heart failure),severe tricuspid regurgitation, nicotine dependence and COPD who presents to [facility] emergency department with suicidal ideation and seizure.Patient explains that since he left the hospital several days ago(admitted6/17-6/19-was being managed for  hyponatremia, thought to be multifactorial in origin. Patient left AMA on6/19 and that time had sodium of 132),he has been experiencing increasing fatigue. This increasing fatigue is associated with bilateral lower extremity tingling. Symptomsweresimilar to what prompted his presentation on 6/17. Additionally, he reported feeling depressed withsuicidal ideation, withouta plan. Patient reportedthat he hadattempted suicide in the distant past using pills but was unsuccessful and never sought medical attention. While the patient was waiting in the waiting room, he experienced a witnessed seizure while attempting to walk to the bathroom. Patient was immediately brought back to the emergency department for further evaluation. Patient was found to have substantial recurrent hyponatremia of 117 with concurrent hypokalemia of 2.6 and hypomagnesium of 1.4. Patient was administered 1236mg  of intravenous Fosphenytoinand initiated on low rate normal saline infusion. Patient was administered 2 g of intravenous magnesium and a 30 mEq of potassium.  Patient's electrolytes have improved, no further seizure episodes at this time.  Echo showed EF of 25-30%, cardiology recommended outpatient follow-up but otherwise continue Coreg, torsemide and Entresto.   In addition to the above patient was noted to have significant difficulties with impaired memory and consistent application of skills necessary to perform IADLs in the outpatient setting. Screening confirmed suspicions. On arrival psychological evaluation completed on 9/23 which confirms a diagnosis of dementia as well as PTSD with recommendation for skilled nursing to ensure patient's safety and stability.   Subjective: Patient alert.  No complaints regarding pain or shortness of breath.  Objective: Vitals:   05/31/20 1005 05/31/20 1148  BP:  117/62  Pulse: 77 83  Resp:  16  Temp:  (!) 97.5 F (36.4 C)  SpO2:  95%    Intake/Output Summary (Last  24 hours) at 05/31/2020 1244 Last data filed at 05/30/2020  1300 Gross per 24 hour  Intake 90 ml  Output --  Net 90 ml   Filed Weights   05/29/20 0604 05/30/20 0531 05/31/20 0437  Weight: 102 kg 102.3 kg 100.5 kg    Exam: Constitutional: NAD, pleasant, comfortable Respiratory: clear to auscultation bilaterally on posterior exam, Normal respiratory effort at rest.  Room air Cardiovascular: Regular rate, no murmurs / rubs / gallops. No lower extremity edema.  No JVD.  Abdomen: no tenderness, Bowel sounds positive.  Increasing abdominal girth Neurologic: CN 2-12 grossly intact. Sensation intact,Strength 5/5 x all 4 extremities. Ambulates independently but does have some balance difficulties when initially getting out of bed while sleepy as documented by PT/OT Psychiatric: alert and oriented x name, year and place although does not have appropriate insight or capacity to manage care independently   Assessment/Plan: Hyponatremia with seizures/multifactorial etiology: 1. Intravascular dehydration, resolved 2.  SIADH 3.  Chronic systolic heart failure intermittent volume overload At time of admission diuretics torsemide & metolazone were held and subsequently  discontinued.   Weight had gone up and suspected evolving heart failure therefore Lasix was increased to BID dosing x 3 days and transitioned to 60 mg daily.   9/2 sodium down to 126-weight up slightly but does not appear consistent with heart failure therefore have decreased Lasix back to 40 mg daily Week of 9/4: Due to persistent hyponatremia covering attending physician obtained SIADH labs which confirmed SIADH physiology.  Lasix was discontinued.  Placed on fluid restriction which patient has been inconsistent in following -sodium subsequently decreased despite FR.  Weight has also increased. 9/13 given increasing weight and decreasing sodium over 24 hours despite adhering to 1200 cc FR have opted to resume low-dose Lasix 40 mg  daily 9/14 after resuming Lasix sodium has increased to 130 and as of 9/15 was 129 Sodium stable at 125 - 126 Suspect patient's baseline sodium will be in the 120s  Chronic congestive heart failure with reduced ejection fraction, 25%.-Associated right heart failure.   Lasix decreased to daily but baseline dosage increased from 40 mg to 60 mg given recent treatment for suspected evolving CHF exacerbation-see above regarding sodium and Lasix adjustments-current dose down to 40 mg daily as above Echo May 2021-EF 25-30% Continue Coreg, Entresto, Lasix along with Daily weights with strict I/O  Unable to uptitrate Entresto due to lower range BP readings Counseled extensively by multiple providers regarding need to adhere to salt restricted diet well as fluid restriction but patient remains noncompliant Cardiology/Dr. Sharyn Lull recommended cardiac follow-up after discharge and likely will need follow-up echo-if EF remains 30% or less would need to be referred to EP clinic for consideration for ICD implantation 9/17 asked staff to begin educating patient regarding management of CHF.   Dry weight unclear but appears to be between 216 and 220 pounds.  Depression/paranoid schizophrenia/major neurocognitive disorder (dementia nonprogressive): Resolved, seen by psychiatry early during the hospitalization.  Recommended to continue BuSpar, Invega and Seroquel.   Follow-up with outpatient psychiatry after.   Continues with a pattern of increasing anxiety 1 week prior to repeat dosing of Invega which is responsive to prn Klonopin  Scored very well on MMSE testing (30) but scored poorly on Short Blessed Test which is consistent w/ functional short term memory deficits c/w a dementia diagnosis.  Appreciate psychiatry evaluation-see note for details-patient has capacity but lacks judgment -given abnormal SBT neuropsychiatric evaluation in process Formal neuro psychiatric cognitive evaluation completed on 9/23 with  confirmation of a diagnosis of dementia as  well as PTSD in addition to known schizophrenia. Recommendation is for skilled nursing. **Mother requesting any necessary paperwork regarding these new diagnosis to submit to disability as well as Medicaid to determine if patient eligible for additional funding/increases in services.LCSW aware and will mail to patient's mother**  Recurrent dental abscesses status post full dental extraction on 8/26 Recurrent.  Initial episode in July that responded to IV antibiotics recurrent episode in August.  Both with confirmed dental abscess orthopantogram in both episodes responsive to IV Unasyn. Status post full dental extraction on 8/26.  Need to follow-up with dental surgery after discharge to ensure appropriate wound healing and evaluation for dentures  Insomnia Unclear if related to volume overload and patient not perceptive that respiratory symptoms may be contributing-treating volume overload as above Patient requested Lunesta which is nonformulary We will increase Ambien from 5 mg to 10 mg as needed Continue melatonin as well  Seizure disorder: Stable Secondary to hyponatremia and issues of nonadherence with medications prior to admission.   Dilantin level subtherapeutic on 7/18 at 2.6 with dosage adjustments made several times during the admission based on Dilantin level 8/11 Dilantin level therapeutic at 11.7 -continue current dose 8/27 follow-up Dilantin level 12.9-unless showed signs of toxicity no indication to repeat Dilantin level for at least 6 weeks  Microcytic anemia Iron studies relatively normal, hemoglobin stable If has significant bleeding postoperatively may need to repeat CBC for several days.  OSA Continue nocturnal CPAP-changing to different mask briefly improve CPAP compliance but currently is not wearing consistently  COPD: stable. As needed bronchodilators  Hyperlipidemia: Daily Lipitor 40  mg  Hypokalemia/hypomagnesemia Replace as needed  Peripheral neuropathy Changed gabapentin to Lyrica 50 mg TID and subsequently titrated up to 75 mg Repeat B12 level 401  Modest obesity Estimated body mass index is 33.68 kg/m as calculated from the following:   Height as of this encounter: 5\' 8"  (1.727 m).   Weight as of this encounter: 100.5 kg.      Data Reviewed: Basic Metabolic Panel: Recent Labs  Lab 05/26/20 0327 05/31/20 0327  NA 125* 126*  K 4.3 3.9  CL 95* 97*  CO2 24 24  GLUCOSE 94 92  BUN 10 11  CREATININE 0.55* 0.54*  CALCIUM 9.0 9.1   Liver Function Tests: No results for input(s): AST, ALT, ALKPHOS, BILITOT, PROT, ALBUMIN in the last 168 hours. No results for input(s): LIPASE, AMYLASE in the last 168 hours. No results for input(s): AMMONIA in the last 168 hours. CBC: No results for input(s): WBC, NEUTROABS, HGB, HCT, MCV, PLT in the last 168 hours. Cardiac Enzymes: No results for input(s): CKTOTAL, CKMB, CKMBINDEX, TROPONINI in the last 168 hours. BNP (last 3 results) Recent Labs    01/22/20 0410 02/24/20 0030 05/08/20 0827  BNP 697.9* 184.7* 48.2    ProBNP (last 3 results) No results for input(s): PROBNP in the last 8760 hours.  CBG: No results for input(s): GLUCAP in the last 168 hours.  No results found for this or any previous visit (from the past 240 hour(s)).   Studies: No results found.  Scheduled Meds: . atorvastatin  40 mg Oral Daily  . busPIRone  10 mg Oral TID  . carvedilol  3.125 mg Oral BID WC  . chlorhexidine  15 mL Mouth Rinse BID  . digoxin  0.125 mg Oral Daily  . fluticasone furoate-vilanterol  1 puff Inhalation Daily  . furosemide  40 mg Oral Daily  . magnesium oxide  400 mg Oral Daily  .  phenytoin  300 mg Oral BID  . pregabalin  75 mg Oral TID  . QUEtiapine  200 mg Oral Daily  . sacubitril-valsartan  1 tablet Oral BID   Continuous Infusions:   Principal Problem:   Hyponatremia Active Problems:    Chronic systolic CHF (congestive heart failure) (HCC)   Mixed hyperlipidemia   COPD (chronic obstructive pulmonary disease) (HCC)   Seizure disorder (HCC)   Paranoid schizophrenia (HCC)   Hypokalemia   Hypomagnesemia   Suicidal ideation   Noncompliance with medications   Subtherapeutic serum dilantin level   Seizure (HCC)   Chronic post-traumatic stress disorder (PTSD)        Consultants:  Dentist/oral surgeon  Procedures:  None  Antibiotics: Anti-infectives (From admission, onward)   Start     Dose/Rate Route Frequency Ordered Stop   05/02/20 1100  amoxicillin-clavulanate (AUGMENTIN) 875-125 MG per tablet 1 tablet  Status:  Discontinued        1 tablet Oral Every 12 hours 05/02/20 1048 05/02/20 1048   05/02/20 1100  amoxicillin-clavulanate (AUGMENTIN) 875-125 MG per tablet 1 tablet        1 tablet Oral Every 12 hours 05/02/20 1048 05/03/20 2143   04/29/20 0800  ceFAZolin (ANCEF) IVPB 2g/100 mL premix        2 g 200 mL/hr over 30 Minutes Intravenous On call to O.R. 04/27/20 1814 04/29/20 0918   04/24/20 2100  Ampicillin-Sulbactam (UNASYN) 3 g in sodium chloride 0.9 % 100 mL IVPB  Status:  Discontinued        3 g 200 mL/hr over 30 Minutes Intravenous Every 6 hours 04/24/20 1936 05/02/20 1048   04/02/20 2200  amoxicillin-clavulanate (AUGMENTIN) 875-125 MG per tablet 1 tablet        1 tablet Oral Every 12 hours 04/02/20 1405 04/04/20 2130   03/29/20 0930  Ampicillin-Sulbactam (UNASYN) 3 g in sodium chloride 0.9 % 100 mL IVPB  Status:  Discontinued        3 g 200 mL/hr over 30 Minutes Intravenous Every 6 hours 03/29/20 0915 04/02/20 1405       Time spent: 15 minutes    Junious Silk ANP  [facility] Pager [phone]. If 7PM-7AM, please contact night-coverage at [email or web address] 05/31/2020, 12:44 PM  LOS: 98 days

## 2020-05-31 NOTE — TOC Progression Note (Addendum)
Transition of Care [facility]) - Progression Note    Patient Details  Name: Jim Dunn MRN: [id number] Date of Birth: [birth year]  [facility] CM/SW Contact  Kermit Balo, RN Phone Number: 05/31/2020, 3:02 PM  Clinical Narrative:    So late last week I spoke to patients case worker through [facility] a Ms Clelia Croft [phone]. With pts mothers permission I faxed Ms Clelia Croft information to assist with his special medicaid [phone]). Today I have called multiple ALF in [place], [facility]. I have faxed his information to [facility] in [place], [place] and [facility] in [place], [place]. Most of the other facilities either didn't take Medicaid, I had to leave a message or the phone wasn't working. I have also attempted some of the ALFs that accept medicaid in [place]. I have faxed his information to [employer].  [address], [facility], [place] all were having phone issues.  TOC following.   Expected Discharge Plan: Assisted Living Barriers to Discharge: Continued Medical Work up, Unsafe home situation  Expected Discharge Plan and Services Expected Discharge Plan: Assisted Living       Living arrangements for the past 2 months: Single Family Home                                       Social Determinants of Health (SDOH) Interventions    Readmission Risk Interventions Readmission Risk Prevention Plan 01/23/2020  Transportation Screening Complete  HRI or Home Care Consult Complete  Social Work Consult for Recovery Care Planning/Counseling Complete  Palliative Care Screening Not Applicable  Medication Review Oceanographer) (No Data)

## 2020-06-01 MED ORDER — WHITE PETROLATUM EX OINT
TOPICAL_OINTMENT | CUTANEOUS | Status: AC
  Administered 2020-06-01: 0.2
  Filled 2020-06-01: qty 28.35

## 2020-06-01 NOTE — Progress Notes (Signed)
[facility] PROGRESS NOTE  Jim Dunn DJM:[id number] DOB: [birth year] DOA: 02/23/2020 PCP: Pcp, No  Status: Inpatient---Remains inpatient appropriate because:Altered mental status and Unsafe d/c plan   Dispo: The patient is from: Home              Anticipated d/c is to: ALF               Anticipated d/c date is: > 3 days              Patient currently is not medically stable to d/c. **NEEDS SAFE DC PLAN** Currently awaiting placement, TOC team working on this.  Mother is currently working to obtain Medicaid/guardianship. Unsafe for discharge based on underlying schizophrenia and history of recurrent noncompliance which led to patient's current hospitalization.   Based on clinical evaluation as well as screening tests during the hospitalization patient appears to have neurocognitive abnormalities. Formal neuropsychiatric consultation completed on 9/23 with patient demonstrating multiple cognitive deficits/impairments particularly related to inability to store and organize newly learned information. This evaluation is consistent with a diagnosis of dementia. Also has known schizophrenia and clinical features of PTSD.  Currently awaiting facility bed that accepts Medicaid.  No bed offers as of yet.  Code Status: Full Family Communication: 9/24 updated [name] [phone] or [phone] was also at bedside on 9/17 and spoke extensively with OT during their evaluation DVT prophylaxis: 8/5 refused Lovenox therefore switched to SCDs since ambulating without difficulty Vaccination status: Vaccinated   HPI (time of admission): 54 year old male with past medical history of schizophrenia, seizure disorder, biventricular heart failure(Echo 01/2020 EF 25-30%with right-sided heart failure),severe tricuspid regurgitation, nicotine dependence and COPD who presents to [facility] emergency department with suicidal ideation and seizure.Patient explains that since he  left the hospital several days ago(admitted6/17-6/19-was being managed for hyponatremia, thought to be multifactorial in origin. Patient left AMA on6/19 and that time had sodium of 132),he has been experiencing increasing fatigue. This increasing fatigue is associated with bilateral lower extremity tingling. Symptomsweresimilar to what prompted his presentation on 6/17. Additionally, he reported feeling depressed withsuicidal ideation, withouta plan. Patient reportedthat he hadattempted suicide in the distant past using pills but was unsuccessful and never sought medical attention. While the patient was waiting in the waiting room, he experienced a witnessed seizure while attempting to walk to the bathroom. Patient was immediately brought back to the emergency department for further evaluation. Patient was found to have substantial recurrent hyponatremia of 117 with concurrent hypokalemia of 2.6 and hypomagnesium of 1.4. Patient was administered 1236mg  of intravenous Fosphenytoinand initiated on low rate normal saline infusion. Patient was administered 2 g of intravenous magnesium and a 30 mEq of potassium.  Patient's electrolytes have improved, no further seizure episodes at this time.  Echo showed EF of 25-30%, cardiology recommended outpatient follow-up but otherwise continue Coreg, torsemide and Entresto.   In addition to the above patient was noted to have significant difficulties with impaired memory and consistent application of skills necessary to perform IADLs in the outpatient setting. Screening confirmed suspicions. On arrival psychological evaluation completed on 9/23 which confirms a diagnosis of dementia as well as PTSD with recommendation for skilled nursing to ensure patient's safety and stability.   Subjective: Ambulating in room.  Denies chest pain or shortness of breath.  Wore CPAP last night.  Objective: Vitals:   06/01/20 0422 06/01/20 0726  BP: 95/65 129/74   Pulse: 70 76  Resp: 19 18  Temp: 98.8 F (37.1 C) 98 F (  36.7 C)  SpO2: 98% 97%    Intake/Output Summary (Last 24 hours) at 06/01/2020 1041 Last data filed at 06/01/2020 0700 Gross per 24 hour  Intake 710 ml  Output --  Net 710 ml   Filed Weights   05/30/20 0531 05/31/20 0437 06/01/20 0545  Weight: 102.3 kg 100.5 kg 100.1 kg    Exam: Constitutional: NAD, pleasant, comfortable Respiratory: clear to auscultation bilaterally on posterior exam, Normal respiratory effort at rest.  Room air Cardiovascular: Regular rate, no murmurs / rubs / gallops. No lower extremity edema.  No JVD.  Abdomen: no tenderness, Bowel sounds positive.  Increasing abdominal girth Neurologic: CN 2-12 grossly intact. Sensation intact,Strength 5/5 x all 4 extremities. Ambulates independently but does have some balance difficulties when initially getting out of bed while sleepy as documented by PT/OT Psychiatric: alert and oriented x name, year and place although does not have appropriate insight or capacity to manage care independently   Assessment/Plan: Hyponatremia with seizures/multifactorial etiology: 1. Intravascular dehydration, resolved 2.  SIADH 3.  Chronic systolic heart failure intermittent volume overload At time of admission diuretics torsemide & metolazone were held and subsequently discontinued in context of severe dehydration.   Stable on Lasix 40 mg daily Due to persistent hyponatremia covering attending physician obtained SIADH labs which confirmed SIADH physiology.  Formal fluid restriction placed and patient continually educated on importance of maintaining fluid restriction Sodium stable at 125 - 126 Suspect patient's baseline sodium will be in the 120s  Chronic congestive heart failure with reduced ejection fraction, 25%.-Associated right heart failure.   Lasix decreased to daily but baseline dosage increased from 40 mg to 60 mg given recent treatment for suspected evolving CHF  exacerbation-see above regarding sodium and Lasix adjustments-current dose down to 40 mg daily as above Echo May 2021-EF 25-30% Continue Coreg, Entresto, Lasix along with Daily weights with strict I/O  Unable to uptitrate Entresto due to lower range BP readings Counseled extensively by multiple providers regarding need to adhere to salt restricted diet well as fluid restriction but patient remains noncompliant Cardiology/Dr. Sharyn Lull recommended cardiac follow-up after discharge and likely will need follow-up echo-if EF remains 30% or less would need to be referred to EP clinic for consideration for ICD implantation 9/17 asked staff to begin educating patient regarding management of CHF.   Dry weight unclear but appears to be between 216 and 220 pounds.  Depression/paranoid schizophrenia/major neurocognitive disorder (dementia nonprogressive): Resolved, seen by psychiatry early during the hospitalization.  Recommended to continue BuSpar, Invega and Seroquel.   Follow-up with outpatient psychiatry after.   Continues with a pattern of increasing anxiety 1 week prior to repeat dosing of Invega which is responsive to prn Klonopin  Scored very well on MMSE testing (30) but scored poorly on Short Blessed Test which is consistent w/ functional short term memory deficits c/w a dementia diagnosis.  Appreciate psychiatry evaluation-see note for details-patient has capacity but lacks judgment -given abnormal SBT neuropsychiatric evaluation in process Formal neuro psychiatric cognitive evaluation completed on 9/23 with confirmation of a diagnosis of dementia as well as PTSD in addition to known schizophrenia. Recommendation is for skilled nursing.  Recurrent dental abscesses status post full dental extraction on 8/26 Recurrent.  Initial episode in July that responded to IV antibiotics recurrent episode in August.  Both with confirmed dental abscess orthopantogram in both episodes responsive to IV  Unasyn. Status post full dental extraction on 8/26.  Need to follow-up with dental surgery after discharge to ensure appropriate wound  healing and evaluation for dentures  Insomnia Unclear if related to volume overload and patient not perceptive that respiratory symptoms may be contributing-treating volume overload as above Patient requested Lunesta which is nonformulary We will increase Ambien from 5 mg to 10 mg as needed Continue melatonin as well  Seizure disorder: Stable Secondary to hyponatremia and issues of nonadherence with medications prior to admission.   Dilantin level subtherapeutic on 7/18 at 2.6 with dosage adjustments made several times during the admission based on Dilantin level 8/11 Dilantin level therapeutic at 11.7 -continue current dose 8/27 follow-up Dilantin level 12.9-unless showed signs of toxicity no indication to repeat Dilantin level for at least 6 weeks  Microcytic anemia Iron studies relatively normal, hemoglobin stable If has significant bleeding postoperatively may need to repeat CBC for several days.  OSA Continue nocturnal CPAP-changing to different mask briefly improve CPAP compliance but currently is not wearing consistently  COPD: stable. As needed bronchodilators  Hyperlipidemia: Daily Lipitor 40 mg  Hypokalemia/hypomagnesemia Replace as needed  Peripheral neuropathy Changed gabapentin to Lyrica 50 mg TID and subsequently titrated up to 75 mg Repeat B12 level 401  Modest obesity Estimated body mass index is 33.54 kg/m as calculated from the following:   Height as of this encounter: 5\' 8"  (1.727 m).   Weight as of this encounter: 100.1 kg.      Data Reviewed: Basic Metabolic Panel: Recent Labs  Lab 05/26/20 0327 05/31/20 0327  NA 125* 126*  K 4.3 3.9  CL 95* 97*  CO2 24 24  GLUCOSE 94 92  BUN 10 11  CREATININE 0.55* 0.54*  CALCIUM 9.0 9.1   Liver Function Tests: No results for input(s): AST, ALT, ALKPHOS,  BILITOT, PROT, ALBUMIN in the last 168 hours. No results for input(s): LIPASE, AMYLASE in the last 168 hours. No results for input(s): AMMONIA in the last 168 hours. CBC: No results for input(s): WBC, NEUTROABS, HGB, HCT, MCV, PLT in the last 168 hours. Cardiac Enzymes: No results for input(s): CKTOTAL, CKMB, CKMBINDEX, TROPONINI in the last 168 hours. BNP (last 3 results) Recent Labs    01/22/20 0410 02/24/20 0030 05/08/20 0827  BNP 697.9* 184.7* 48.2    ProBNP (last 3 results) No results for input(s): PROBNP in the last 8760 hours.  CBG: No results for input(s): GLUCAP in the last 168 hours.  No results found for this or any previous visit (from the past 240 hour(s)).   Studies: No results found.  Scheduled Meds: . atorvastatin  40 mg Oral Daily  . busPIRone  10 mg Oral TID  . carvedilol  3.125 mg Oral BID WC  . chlorhexidine  15 mL Mouth Rinse BID  . digoxin  0.125 mg Oral Daily  . fluticasone furoate-vilanterol  1 puff Inhalation Daily  . furosemide  40 mg Oral Daily  . magnesium oxide  400 mg Oral Daily  . phenytoin  300 mg Oral BID  . pregabalin  75 mg Oral TID  . QUEtiapine  200 mg Oral Daily  . sacubitril-valsartan  1 tablet Oral BID   Continuous Infusions:   Principal Problem:   Hyponatremia Active Problems:   Chronic systolic CHF (congestive heart failure) (HCC)   Mixed hyperlipidemia   COPD (chronic obstructive pulmonary disease) (HCC)   Seizure disorder (HCC)   Paranoid schizophrenia (HCC)   Hypokalemia   Hypomagnesemia   Suicidal ideation   Noncompliance with medications   Subtherapeutic serum dilantin level   Seizure (HCC)   Chronic post-traumatic stress disorder (PTSD)  Consultants:  Dentist/oral surgeon  Procedures:  None  Antibiotics: Anti-infectives (From admission, onward)   Start     Dose/Rate Route Frequency Ordered Stop   05/02/20 1100  amoxicillin-clavulanate (AUGMENTIN) 875-125 MG per tablet 1 tablet  Status:   Discontinued        1 tablet Oral Every 12 hours 05/02/20 1048 05/02/20 1048   05/02/20 1100  amoxicillin-clavulanate (AUGMENTIN) 875-125 MG per tablet 1 tablet        1 tablet Oral Every 12 hours 05/02/20 1048 05/03/20 2143   04/29/20 0800  ceFAZolin (ANCEF) IVPB 2g/100 mL premix        2 g 200 mL/hr over 30 Minutes Intravenous On call to O.R. 04/27/20 1814 04/29/20 0918   04/24/20 2100  Ampicillin-Sulbactam (UNASYN) 3 g in sodium chloride 0.9 % 100 mL IVPB  Status:  Discontinued        3 g 200 mL/hr over 30 Minutes Intravenous Every 6 hours 04/24/20 1936 05/02/20 1048   04/02/20 2200  amoxicillin-clavulanate (AUGMENTIN) 875-125 MG per tablet 1 tablet        1 tablet Oral Every 12 hours 04/02/20 1405 04/04/20 2130   03/29/20 0930  Ampicillin-Sulbactam (UNASYN) 3 g in sodium chloride 0.9 % 100 mL IVPB  Status:  Discontinued        3 g 200 mL/hr over 30 Minutes Intravenous Every 6 hours 03/29/20 0915 04/02/20 1405       Time spent: 15 minutes    Junious Silk ANP  [facility] Pager [phone]. If 7PM-7AM, please contact night-coverage at [email or web address] 06/01/2020, 10:41 AM  LOS: 99 days

## 2020-06-02 NOTE — Progress Notes (Signed)
[facility] PROGRESS NOTE  Jim Dunn PXT:[id number] DOB: August 08, 1966 DOA: 02/23/2020 PCP: Pcp, No  Status: Inpatient---Remains inpatient appropriate because:Altered mental status and Unsafe d/c plan   Dispo: The patient is from: Home              Anticipated d/c is to: ALF               Anticipated d/c date is: > 3 days              Patient currently is not medically stable to d/c. **NEEDS SAFE DC PLAN** Currently awaiting placement, TOC team working on this.  Mother is currently working to obtain Medicaid/guardianship. Unsafe for discharge based on underlying schizophrenia and history of recurrent noncompliance which led to patient's current hospitalization.   Based on clinical evaluation as well as screening tests during the hospitalization patient appears to have neurocognitive abnormalities. Formal neuropsychiatric consultation completed on 9/23 with patient demonstrating multiple cognitive deficits/impairments particularly related to inability to store and organize newly learned information. This evaluation is consistent with a diagnosis of dementia. Also has known schizophrenia and clinical features of PTSD.  Currently awaiting facility bed that accepts Medicaid.  No bed offers as of yet.  Code Status: Full Family Communication: 9/24 updated [name] [phone] or [phone]]-mother was also at bedside on 9/17 and spoke extensively with OT during their evaluation DVT prophylaxis: 8/5 refused Lovenox therefore switched to SCDs since ambulating without difficulty Vaccination status: Vaccinated   HPI (time of admission): 54 year old male with past medical history of schizophrenia, seizure disorder, biventricular heart failure(Echo 01/2020 EF 25-30%with right-sided heart failure),severe tricuspid regurgitation, nicotine dependence and COPD who presents to [facility] emergency department with suicidal ideation and seizure.Patient explains that since he  left the hospital several days ago(admitted6/17-6/19-was being managed for hyponatremia, thought to be multifactorial in origin. Patient left AMA on6/19 and that time had sodium of 132),he has been experiencing increasing fatigue. This increasing fatigue is associated with bilateral lower extremity tingling. Symptomsweresimilar to what prompted his presentation on 6/17. Additionally, he reported feeling depressed withsuicidal ideation, withouta plan. Patient reportedthat he hadattempted suicide in the distant past using pills but was unsuccessful and never sought medical attention. While the patient was waiting in the waiting room, he experienced a witnessed seizure while attempting to walk to the bathroom. Patient was immediately brought back to the emergency department for further evaluation. Patient was found to have substantial recurrent hyponatremia of 117 with concurrent hypokalemia of 2.6 and hypomagnesium of 1.4. Patient was administered 1236mg  of intravenous Fosphenytoinand initiated on low rate normal saline infusion. Patient was administered 2 g of intravenous magnesium and a 30 mEq of potassium.  Patient's electrolytes have improved, no further seizure episodes at this time.  Echo showed EF of 25-30%, cardiology recommended outpatient follow-up but otherwise continue Coreg, torsemide and Entresto.   In addition to the above patient was noted to have significant difficulties with impaired memory and consistent application of skills necessary to perform IADLs in the outpatient setting. Screening confirmed suspicions. On arrival psychological evaluation completed on 9/23 which confirms a diagnosis of dementia as well as PTSD with recommendation for skilled nursing to ensure patient's safety and stability.   Subjective: Sitting up in chair in room.  In the past 24 hours patient has shaved all of his hair off  Objective: Vitals:   06/02/20 0817 06/02/20 1151  BP: (!) 141/75 (!)  90/49  Pulse: 77 86  Resp: 18 18  Temp:  97.7 F (36.5 C) 98.9 F (37.2 C)  SpO2: 99% 95%    Intake/Output Summary (Last 24 hours) at 06/02/2020 1301 Last data filed at 06/02/2020 0640 Gross per 24 hour  Intake 597 ml  Output 900 ml  Net -303 ml   Filed Weights   05/31/20 0437 06/01/20 0545 06/02/20 0621  Weight: 100.5 kg 100.1 kg 99.7 kg    Exam: Constitutional: NAD, pleasant, comfortable Respiratory: clear to auscultation bilaterally on posterior exam, Normal respiratory effort at rest.  Room air Cardiovascular: Regular rate, no murmurs / rubs / gallops. No lower extremity edema.  No JVD.  Abdomen: no tenderness, Bowel sounds positive.  Increasing abdominal girth Neurologic: CN 2-12 grossly intact. Sensation intact,Strength 5/5 x all 4 extremities. Ambulates independently but does have some balance difficulties when initially getting out of bed while sleepy as documented by PT/OT Psychiatric: alert and oriented x name, year and place although does not have appropriate insight or capacity to manage care independently   Assessment/Plan: Hyponatremia with seizures/multifactorial etiology: 1. Intravascular dehydration, resolved 2.  SIADH 3.  Chronic systolic heart failure intermittent volume overload At time of admission diuretics torsemide & metolazone were held and subsequently discontinued in context of severe dehydration.   Stable on Lasix 40 mg daily Due to persistent hyponatremia covering attending physician obtained SIADH labs which confirmed SIADH physiology.  Formal fluid restriction placed and patient continually educated on importance of maintaining fluid restriction Sodium stable at 125 - 126 Suspect patient's baseline sodium will be in the 120s  Chronic congestive heart failure with reduced ejection fraction, 25%.-Associated right heart failure.   Lasix decreased to daily but baseline dosage increased from 40 mg to 60 mg given recent treatment for suspected evolving  CHF exacerbation-see above regarding sodium and Lasix adjustments-current dose down to 40 mg daily as above Echo May 2021-EF 25-30% Continue Coreg, Entresto, Lasix along with Daily weights with strict I/O  Unable to uptitrate Entresto due to lower range BP readings Counseled extensively by multiple providers regarding need to adhere to salt restricted diet well as fluid restriction but patient remains noncompliant Cardiology/Dr. Sharyn Lull recommended cardiac follow-up after discharge and likely will need follow-up echo-if EF remains 30% or less would need to be referred to EP clinic for consideration for ICD implantation 9/17 asked staff to begin educating patient regarding management of CHF.   Dry weight unclear but appears to be between 216 and 220 pounds.  Depression/paranoid schizophrenia/major neurocognitive disorder (dementia nonprogressive): Resolved, seen by psychiatry early during the hospitalization.  Recommended to continue BuSpar, Invega and Seroquel.   Follow-up with outpatient psychiatry after.   Continues with a pattern of increasing anxiety 1 week prior to repeat dosing of Invega which is responsive to prn Klonopin  Formal neuro psychiatric cognitive evaluation completed on 9/23 with confirmation of a diagnosis of dementia as well as PTSD in addition to known schizophrenia. Recommendation is for skilled nursing.  Recurrent dental abscesses status post full dental extraction on 8/26 Recurrent.  Initial episode in July that responded to IV antibiotics recurrent episode in August.  Both with confirmed dental abscess orthopantogram in both episodes responsive to IV Unasyn. Status post full dental extraction on 8/26.  Need to follow-up with dental surgery after discharge to ensure appropriate wound healing and evaluation for dentures  Insomnia Continue Ambien 10 mg as needed Continue melatonin   Seizure disorder: Stable Secondary to hyponatremia and issues of nonadherence with  medications prior to admission.   Dilantin level subtherapeutic on 7/18  at 2.6  Patient has undergone several up titrations in his Dilantin dosage.  8/27 follow-up Dilantin level 12.9-unless showed signs of toxicity no indication to repeat Dilantin level for at least 6 weeks  Microcytic anemia Iron studies relatively normal, hemoglobin stable If has significant bleeding postoperatively may need to repeat CBC for several days.  OSA Continue nocturnal CPAP-changing to different mask briefly improve CPAP compliance but currently is not wearing consistently  COPD: stable. As needed bronchodilators  Hyperlipidemia: Daily Lipitor 40 mg  Hypokalemia/hypomagnesemia Replace as needed  Peripheral neuropathy Changed gabapentin to Lyrica 50 mg TID and subsequently titrated up to 75 mg Repeat B12 level 401  Modest obesity Estimated body mass index is 33.42 kg/m as calculated from the following:   Height as of this encounter: 5\' 8"  (1.727 m).   Weight as of this encounter: 99.7 kg.      Data Reviewed: Basic Metabolic Panel: Recent Labs  Lab 05/31/20 0327  NA 126*  K 3.9  CL 97*  CO2 24  GLUCOSE 92  BUN 11  CREATININE 0.54*  CALCIUM 9.1   Liver Function Tests: No results for input(s): AST, ALT, ALKPHOS, BILITOT, PROT, ALBUMIN in the last 168 hours. No results for input(s): LIPASE, AMYLASE in the last 168 hours. No results for input(s): AMMONIA in the last 168 hours. CBC: No results for input(s): WBC, NEUTROABS, HGB, HCT, MCV, PLT in the last 168 hours. Cardiac Enzymes: No results for input(s): CKTOTAL, CKMB, CKMBINDEX, TROPONINI in the last 168 hours. BNP (last 3 results) Recent Labs    01/22/20 0410 02/24/20 0030 05/08/20 0827  BNP 697.9* 184.7* 48.2    ProBNP (last 3 results) No results for input(s): PROBNP in the last 8760 hours.  CBG: No results for input(s): GLUCAP in the last 168 hours.  No results found for this or any previous visit (from the  past 240 hour(s)).   Studies: No results found.  Scheduled Meds: . atorvastatin  40 mg Oral Daily  . busPIRone  10 mg Oral TID  . carvedilol  3.125 mg Oral BID WC  . chlorhexidine  15 mL Mouth Rinse BID  . digoxin  0.125 mg Oral Daily  . fluticasone furoate-vilanterol  1 puff Inhalation Daily  . furosemide  40 mg Oral Daily  . magnesium oxide  400 mg Oral Daily  . phenytoin  300 mg Oral BID  . pregabalin  75 mg Oral TID  . QUEtiapine  200 mg Oral Daily  . sacubitril-valsartan  1 tablet Oral BID   Continuous Infusions:   Principal Problem:   Hyponatremia Active Problems:   Chronic systolic CHF (congestive heart failure) (HCC)   Mixed hyperlipidemia   COPD (chronic obstructive pulmonary disease) (HCC)   Seizure disorder (HCC)   Paranoid schizophrenia (HCC)   Hypokalemia   Hypomagnesemia   Suicidal ideation   Noncompliance with medications   Subtherapeutic serum dilantin level   Seizure (HCC)   Chronic post-traumatic stress disorder (PTSD)        Consultants:  Dentist/oral surgeon  Procedures:  None  Antibiotics: Anti-infectives (From admission, onward)   Start     Dose/Rate Route Frequency Ordered Stop   05/02/20 1100  amoxicillin-clavulanate (AUGMENTIN) 875-125 MG per tablet 1 tablet  Status:  Discontinued        1 tablet Oral Every 12 hours 05/02/20 1048 05/02/20 1048   05/02/20 1100  amoxicillin-clavulanate (AUGMENTIN) 875-125 MG per tablet 1 tablet        1 tablet Oral Every 12  hours 05/02/20 1048 05/03/20 2143   04/29/20 0800  ceFAZolin (ANCEF) IVPB 2g/100 mL premix        2 g 200 mL/hr over 30 Minutes Intravenous On call to O.R. 04/27/20 1814 04/29/20 0918   04/24/20 2100  Ampicillin-Sulbactam (UNASYN) 3 g in sodium chloride 0.9 % 100 mL IVPB  Status:  Discontinued        3 g 200 mL/hr over 30 Minutes Intravenous Every 6 hours 04/24/20 1936 05/02/20 1048   04/02/20 2200  amoxicillin-clavulanate (AUGMENTIN) 875-125 MG per tablet 1 tablet        1  tablet Oral Every 12 hours 04/02/20 1405 04/04/20 2130   03/29/20 0930  Ampicillin-Sulbactam (UNASYN) 3 g in sodium chloride 0.9 % 100 mL IVPB  Status:  Discontinued        3 g 200 mL/hr over 30 Minutes Intravenous Every 6 hours 03/29/20 0915 04/02/20 1405       Time spent: 15 minutes    Junious Silk ANP  [facility] Pager [phone]. If 7PM-7AM, please contact night-coverage at [email or web address] 06/02/2020, 1:01 PM  LOS: 100 days

## 2020-06-02 NOTE — TOC Progression Note (Signed)
Transition of Care [facility]) - Progression Note    Patient Details  Name: Lea Walbert MRN: [id number] Date of Birth: [birth year]  Transition of Care [facility]) CM/SW Contact  Kermit Balo, RN Phone Number: 06/02/2020, 3:43 PM  Clinical Narrative:    [facility] in [place], [place] and [facility] in [place], [place] have both said no for an ALF admission.  CM has faxed the patients information to [facility] in [place] for review and left message for [employer] to see if they have reviewed pts information. TOC following.   Expected Discharge Plan: Assisted Living Barriers to Discharge: Continued Medical Work up, Unsafe home situation  Expected Discharge Plan and Services Expected Discharge Plan: Assisted Living       Living arrangements for the past 2 months: Single Family Home                                       Social Determinants of Health (SDOH) Interventions    Readmission Risk Interventions Readmission Risk Prevention Plan 01/23/2020  Transportation Screening Complete  HRI or Home Care Consult Complete  Social Work Consult for Recovery Care Planning/Counseling Complete  Palliative Care Screening Not Applicable  Medication Review Oceanographer) (No Data)

## 2020-06-03 MED ORDER — ALPRAZOLAM 0.5 MG PO TABS
0.5000 mg | ORAL_TABLET | Freq: Two times a day (BID) | ORAL | Status: DC
  Administered 2020-06-03 – 2020-06-08 (×11): 0.5 mg via ORAL
  Filled 2020-06-03 (×11): qty 1

## 2020-06-03 NOTE — Progress Notes (Signed)
[facility] PROGRESS NOTE  Kirklin Barno XOV:[id number] DOB: 1965/10/25 DOA: 02/23/2020 PCP: Pcp, No  Status: Inpatient---Remains inpatient appropriate because:Altered mental status and Unsafe d/c plan   Dispo: The patient is from: Home              Anticipated d/c is to: ALF               Anticipated d/c date is: > 3 days              Patient currently is not medically stable to d/c. **NEEDS SAFE DC PLAN** Currently awaiting placement, TOC team working on this.  Mother is currently working to obtain Medicaid/guardianship. Unsafe for discharge based on underlying schizophrenia and history of recurrent noncompliance which led to patient's current hospitalization.   Based on clinical evaluation as well as screening tests during the hospitalization patient appears to have neurocognitive abnormalities. Formal neuropsychiatric consultation completed on 9/23 with patient demonstrating multiple cognitive deficits/impairments particularly related to inability to store and organize newly learned information. This evaluation is consistent with a diagnosis of dementia. Also has known schizophrenia and clinical features of PTSD.  Currently awaiting facility bed that accepts Medicaid.  No bed offers as of yet.  Code Status: Full Family Communication: 9/24 updated [name] [phone] or [phone] was also at bedside on 9/17 and spoke extensively with OT during their evaluation DVT prophylaxis: 8/5 refused Lovenox therefore switched to SCDs since ambulating without difficulty Vaccination status: Vaccinated   HPI (time of admission): 54 year old male with past medical history of schizophrenia, seizure disorder, biventricular heart failure(Echo 01/2020 EF 25-30%with right-sided heart failure),severe tricuspid regurgitation, nicotine dependence and COPD who presents to [facility] emergency department with suicidal ideation and seizure.Patient explains that since he  left the hospital several days ago(admitted6/17-6/19-was being managed for hyponatremia, thought to be multifactorial in origin. Patient left AMA on6/19 and that time had sodium of 132),he has been experiencing increasing fatigue. This increasing fatigue is associated with bilateral lower extremity tingling. Symptomsweresimilar to what prompted his presentation on 6/17. Additionally, he reported feeling depressed withsuicidal ideation, withouta plan. Patient reportedthat he hadattempted suicide in the distant past using pills but was unsuccessful and never sought medical attention. While the patient was waiting in the waiting room, he experienced a witnessed seizure while attempting to walk to the bathroom. Patient was immediately brought back to the emergency department for further evaluation. Patient was found to have substantial recurrent hyponatremia of 117 with concurrent hypokalemia of 2.6 and hypomagnesium of 1.4. Patient was administered 1236mg  of intravenous Fosphenytoinand initiated on low rate normal saline infusion. Patient was administered 2 g of intravenous magnesium and a 30 mEq of potassium.  Patient's electrolytes have improved, no further seizure episodes at this time.  Echo showed EF of 25-30%, cardiology recommended outpatient follow-up but otherwise continue Coreg, torsemide and Entresto.   In addition to the above patient was noted to have significant difficulties with impaired memory and consistent application of skills necessary to perform IADLs in the outpatient setting. Screening confirmed suspicions. On arrival psychological evaluation completed on 9/23 which confirms a diagnosis of dementia as well as PTSD with recommendation for skilled nursing to ensure patient's safety and stability.   Subjective: Patient requests his Klonopin be changed to his readmission medication of Xanax.  Objective: Vitals:   06/03/20 0743 06/03/20 1117  BP: 117/82 (!) 142/70   Pulse: 80 66  Resp: 18 18  Temp: 98.5 F (36.9 C) 97.9 F (36.6 C)  SpO2: 100% 100%    Intake/Output Summary (Last 24 hours) at 06/03/2020 1135 Last data filed at 06/03/2020 6160 Gross per 24 hour  Intake 934 ml  Output --  Net 934 ml   Filed Weights   06/01/20 0545 06/02/20 0621 06/03/20 0422  Weight: 100.1 kg 99.7 kg 101.2 kg    Exam: Constitutional: NAD, pleasant, comfortable Respiratory: clear to auscultation bilaterally on posterior exam, Normal respiratory effort at rest.  Room air Cardiovascular: Regular rate, no murmurs / rubs / gallops. No lower extremity edema.  No JVD.  Abdomen: no tenderness, Bowel sounds positive.  Increasing abdominal girth Neurologic: CN 2-12 grossly intact. Sensation intact,Strength 5/5 x all 4 extremities. Ambulates independently but does have some balance difficulties when initially getting out of bed while sleepy as documented by PT/OT Psychiatric: alert and oriented x name, year and place although does not have appropriate insight or capacity to manage care independently   Assessment/Plan: Hyponatremia with seizures/multifactorial etiology: 1. Intravascular dehydration, resolved 2.  SIADH 3.  Chronic systolic heart failure intermittent volume overload At time of admission diuretics torsemide & metolazone were held and subsequently discontinued in context of severe dehydration.   Stable on Lasix 40 mg daily Due to persistent hyponatremia covering attending physician obtained SIADH labs which confirmed SIADH physiology.  Formal fluid restriction placed and patient continually educated on importance of maintaining fluid restriction Sodium stable at 125 - 126 Suspect patient's baseline sodium will be in the 120s  Chronic congestive heart failure with reduced ejection fraction, 25%.-Associated right heart failure.   Lasix decreased to daily but baseline dosage increased from 40 mg to 60 mg given recent treatment for suspected evolving CHF  exacerbation-see above regarding sodium and Lasix adjustments-current dose down to 40 mg daily as above Echo May 2021-EF 25-30% Continue Coreg, Entresto, Lasix along with Daily weights with strict I/O  Unable to uptitrate Entresto due to lower range BP readings Counseled extensively by multiple providers regarding need to adhere to salt restricted diet well as fluid restriction but patient remains noncompliant Cardiology/Dr. Sharyn Lull recommended cardiac follow-up after discharge and likely will need follow-up echo-if EF remains 30% or less would need to be referred to EP clinic for consideration for ICD implantation 9/17 asked staff to begin educating patient regarding management of CHF.   Dry weight unclear but appears to be between 216 and 220 pounds.  Depression/paranoid schizophrenia/major neurocognitive disorder (dementia nonprogressive): Resolved, seen by psychiatry early during the hospitalization.  Recommended to continue BuSpar, Invega and Seroquel.   9/30 changed as needed Klonopin to scheduled Xanax twice daily per preadmission regimen Formal neuro psychiatric cognitive evaluation completed on 9/23 with confirmation of a diagnosis of dementia as well as PTSD in addition to known schizophrenia. Recommendation is for skilled nursing.  Recurrent dental abscesses status post full dental extraction on 8/26 Recurrent.  Initial episode in July that responded to IV antibiotics recurrent episode in August.  Both with confirmed dental abscess orthopantogram in both episodes responsive to IV Unasyn. Status post full dental extraction on 8/26.  Need to follow-up with dental surgery after discharge to ensure appropriate wound healing and evaluation for dentures  Insomnia Continue Ambien 10 mg as needed Continue melatonin   Seizure disorder: Stable Secondary to hyponatremia and issues of nonadherence with medications prior to admission.   Dilantin level subtherapeutic on 7/18 at 2.6  Patient  has undergone several up titrations in his Dilantin dosage.  8/27 follow-up Dilantin level 12.9-unless showed signs of toxicity no indication to  repeat Dilantin level for at least 6 weeks  Microcytic anemia Iron studies relatively normal, hemoglobin stable If has significant bleeding postoperatively may need to repeat CBC for several days.  OSA Continue nocturnal CPAP-changing to different mask briefly improve CPAP compliance but currently is not wearing consistently  COPD: stable. As needed bronchodilators  Hyperlipidemia: Daily Lipitor 40 mg  Hypokalemia/hypomagnesemia Replace as needed  Peripheral neuropathy Changed gabapentin to Lyrica 50 mg TID and subsequently titrated up to 75 mg Repeat B12 level 401  Modest obesity Estimated body mass index is 33.94 kg/m as calculated from the following:   Height as of this encounter: 5\' 8"  (1.727 m).   Weight as of this encounter: 101.2 kg.      Data Reviewed: Basic Metabolic Panel: Recent Labs  Lab 05/31/20 0327  NA 126*  K 3.9  CL 97*  CO2 24  GLUCOSE 92  BUN 11  CREATININE 0.54*  CALCIUM 9.1   Liver Function Tests: No results for input(s): AST, ALT, ALKPHOS, BILITOT, PROT, ALBUMIN in the last 168 hours. No results for input(s): LIPASE, AMYLASE in the last 168 hours. No results for input(s): AMMONIA in the last 168 hours. CBC: No results for input(s): WBC, NEUTROABS, HGB, HCT, MCV, PLT in the last 168 hours. Cardiac Enzymes: No results for input(s): CKTOTAL, CKMB, CKMBINDEX, TROPONINI in the last 168 hours. BNP (last 3 results) Recent Labs    01/22/20 0410 02/24/20 0030 05/08/20 0827  BNP 697.9* 184.7* 48.2    ProBNP (last 3 results) No results for input(s): PROBNP in the last 8760 hours.  CBG: No results for input(s): GLUCAP in the last 168 hours.  No results found for this or any previous visit (from the past 240 hour(s)).   Studies: No results found.  Scheduled Meds: . ALPRAZolam  0.5 mg  Oral BID  . atorvastatin  40 mg Oral Daily  . busPIRone  10 mg Oral TID  . carvedilol  3.125 mg Oral BID WC  . chlorhexidine  15 mL Mouth Rinse BID  . digoxin  0.125 mg Oral Daily  . fluticasone furoate-vilanterol  1 puff Inhalation Daily  . furosemide  40 mg Oral Daily  . magnesium oxide  400 mg Oral Daily  . phenytoin  300 mg Oral BID  . pregabalin  75 mg Oral TID  . QUEtiapine  200 mg Oral Daily  . sacubitril-valsartan  1 tablet Oral BID   Continuous Infusions:   Principal Problem:   Hyponatremia Active Problems:   Chronic systolic CHF (congestive heart failure) (HCC)   Mixed hyperlipidemia   COPD (chronic obstructive pulmonary disease) (HCC)   Seizure disorder (HCC)   Paranoid schizophrenia (HCC)   Hypokalemia   Hypomagnesemia   Suicidal ideation   Noncompliance with medications   Subtherapeutic serum dilantin level   Seizure (HCC)   Chronic post-traumatic stress disorder (PTSD)        Consultants:  Dentist/oral surgeon  Procedures:  None  Antibiotics: Anti-infectives (From admission, onward)   Start     Dose/Rate Route Frequency Ordered Stop   05/02/20 1100  amoxicillin-clavulanate (AUGMENTIN) 875-125 MG per tablet 1 tablet  Status:  Discontinued        1 tablet Oral Every 12 hours 05/02/20 1048 05/02/20 1048   05/02/20 1100  amoxicillin-clavulanate (AUGMENTIN) 875-125 MG per tablet 1 tablet        1 tablet Oral Every 12 hours 05/02/20 1048 05/03/20 2143   04/29/20 0800  ceFAZolin (ANCEF) IVPB 2g/100 mL premix  2 g 200 mL/hr over 30 Minutes Intravenous On call to O.R. 04/27/20 1814 04/29/20 0918   04/24/20 2100  Ampicillin-Sulbactam (UNASYN) 3 g in sodium chloride 0.9 % 100 mL IVPB  Status:  Discontinued        3 g 200 mL/hr over 30 Minutes Intravenous Every 6 hours 04/24/20 1936 05/02/20 1048   04/02/20 2200  amoxicillin-clavulanate (AUGMENTIN) 875-125 MG per tablet 1 tablet        1 tablet Oral Every 12 hours 04/02/20 1405 04/04/20 2130    03/29/20 0930  Ampicillin-Sulbactam (UNASYN) 3 g in sodium chloride 0.9 % 100 mL IVPB  Status:  Discontinued        3 g 200 mL/hr over 30 Minutes Intravenous Every 6 hours 03/29/20 0915 04/02/20 1405       Time spent: 15 minutes    Junious Silk ANP  [facility] Pager [phone]. If 7PM-7AM, please contact night-coverage at [email or web address] 06/03/2020, 11:35 AM  LOS: 101 days

## 2020-06-04 MED ORDER — TUBERCULIN PPD 5 UNIT/0.1ML ID SOLN
5.0000 [IU] | Freq: Once | INTRADERMAL | Status: AC
  Administered 2020-06-05: 5 [IU] via INTRADERMAL
  Filled 2020-06-04: qty 0.1

## 2020-06-04 NOTE — TOC Progression Note (Signed)
Transition of Care [facility]) - Progression Note    Patient Details  Name: Navjot Loera MRN: [id number] Date of Birth: 09-28-1965  [facility]) CM/SW Contact  Pollie Friar, RN Phone Number: 06/04/2020, 1:34 PM  Clinical Narrative:    CM spoke to Mrs Kenton Kingfisher at [facility] yesterday and she came and met with the patient and his mother. Mother is going to tour the facility on Monday. CM has asked MD for TB test for potential admission. TOC following.   Expected Discharge Plan: Assisted Living Barriers to Discharge: Continued Medical Work up, Unsafe home situation  Expected Discharge Plan and Services Expected Discharge Plan: Assisted Living       Living arrangements for the past 2 months: Single Family Home                                       Social Determinants of Health (SDOH) Interventions    Readmission Risk Interventions Readmission Risk Prevention Plan 01/23/2020  Transportation Screening Complete  HRI or Home Care Consult Complete  Social Work Consult for [place] Planning/Counseling [place] Not Applicable  Medication Review Press photographer) (No Data)

## 2020-06-04 NOTE — Progress Notes (Signed)
[facility] PROGRESS NOTE  Jim Dunn SEG:[id number] DOB: 06/22/1966 DOA: 02/23/2020 PCP: Pcp, No  Status: Inpatient---Remains inpatient appropriate because:Altered mental status and Unsafe d/c plan   Dispo: The patient is from: Home              Anticipated d/c is to: ALF               Anticipated d/c date is: > 3 days-10/4 or 10/5              Patient currently is not medically stable to d/c. **NEEDS SAFE DC PLAN** Currently awaiting placement, TOC team working on this.  Mother is currently working to obtain Medicaid/guardianship. Unsafe for discharge based on underlying schizophrenia and history of recurrent noncompliance which led to patient's current hospitalization.   Based on clinical evaluation as well as screening tests during the hospitalization patient appears to have neurocognitive abnormalities. Formal neuropsychiatric consultation completed on 9/23 with patient demonstrating multiple cognitive deficits/impairments particularly related to inability to store and organize newly learned information. This evaluation is consistent with a diagnosis of dementia. Also has known schizophrenia and clinical features of PTSD.  10/1 patient has received a bed offer and his mother plans on touring the facility.  TB skin test has been ordered and if mother agreeable to facility patient will discharge early next week either on 10/4 or 10/5  Code Status: Full Family Communication: 9/24 updated [name] [phone] or [phone]] DVT prophylaxis: SCDs since ambulating without difficulty Vaccination status: Vaccinated   HPI (time of admission): 54 year old male with past medical history of schizophrenia, seizure disorder, biventricular heart failure(Echo 01/2020 EF 25-30%with right-sided heart failure),severe tricuspid regurgitation, nicotine dependence and COPD who presents to [facility] emergency department with suicidal ideation and seizure.Patient explains  that since he left the hospital several days ago(admitted6/17-6/19-was being managed for hyponatremia, thought to be multifactorial in origin. Patient left AMA on6/19 and that time had sodium of 132),he has been experiencing increasing fatigue. This increasing fatigue is associated with bilateral lower extremity tingling. Symptomsweresimilar to what prompted his presentation on 6/17. Additionally, he reported feeling depressed withsuicidal ideation, withouta plan. Patient reportedthat he hadattempted suicide in the distant past using pills but was unsuccessful and never sought medical attention. While the patient was waiting in the waiting room, he experienced a witnessed seizure while attempting to walk to the bathroom. Patient was immediately brought back to the emergency department for further evaluation. Patient was found to have substantial recurrent hyponatremia of 117 with concurrent hypokalemia of 2.6 and hypomagnesium of 1.4. Patient was administered 1236mg  of intravenous Fosphenytoinand initiated on low rate normal saline infusion. Patient was administered 2 g of intravenous magnesium and a 30 mEq of potassium.  Patient's electrolytes have improved, no further seizure episodes at this time.  Echo showed EF of 25-30%, cardiology recommended outpatient follow-up but otherwise continue Coreg, torsemide and Entresto.   In addition to the above patient was noted to have significant difficulties with impaired memory and consistent application of skills necessary to perform IADLs in the outpatient setting. Screening confirmed suspicions. On arrival psychological evaluation completed on 9/23 which confirms a diagnosis of dementia as well as PTSD with recommendation for skilled nursing to ensure patient's safety and stability.   Subjective: Alert, sitting up in bed.  Excited that he now has an apparent definite discharge disposition in place.  Objective: Vitals:   06/04/20 0752  06/04/20 0828  BP:  (!) 150/81  Pulse: 81 81  Resp:  18 18  Temp:  98.9 F (37.2 C)  SpO2: 97% 98%    Intake/Output Summary (Last 24 hours) at 06/04/2020 1001 Last data filed at 06/04/2020 0658 Gross per 24 hour  Intake 900 ml  Output --  Net 900 ml   Filed Weights   06/02/20 0621 06/03/20 0422 06/04/20 0219  Weight: 99.7 kg 101.2 kg 102.1 kg    Exam: Constitutional: NAD, pleasant, comfortable Respiratory: clear to auscultation bilaterally on posterior exam, Normal respiratory effort at rest.  Room air Cardiovascular: Regular rate, no murmurs / rubs / gallops. No lower extremity edema.  No JVD.  Abdomen: no tenderness, Bowel sounds positive.  Increasing abdominal girth Neurologic: CN 2-12 grossly intact. Sensation intact,Strength 5/5 x all 4 extremities. Ambulates independently but does have some balance difficulties when initially getting out of bed while sleepy as documented by PT/OT Psychiatric: alert and oriented x name, year and place although does not have appropriate insight or capacity to manage care independently   Assessment/Plan: Hyponatremia with seizures/multifactorial etiology: 1. Intravascular dehydration, resolved 2.  SIADH 3.  Chronic systolic heart failure intermittent volume overload At time of admission diuretics torsemide & metolazone were held and subsequently discontinued in context of severe dehydration.   Stable on Lasix 40 mg daily Due to persistent hyponatremia covering attending physician obtained SIADH labs which confirmed SIADH physiology.  Formal fluid restriction placed and patient continually educated on importance of maintaining fluid restriction Sodium stable at 125 - 126 Suspect patient's baseline sodium will be in the 120s  Chronic congestive heart failure with reduced ejection fraction, 25%.-Associated right heart failure.   Lasix decreased to daily but baseline dosage increased from 40 mg to 60 mg given recent treatment for suspected  evolving CHF exacerbation-see above regarding sodium and Lasix adjustments-current dose down to 40 mg daily as above Echo May 2021-EF 25-30% Continue Coreg, Entresto, Lasix along with Daily weights with strict I/O  Unable to uptitrate Entresto due to lower range BP readings Counseled extensively by multiple providers regarding need to adhere to salt restricted diet well as fluid restriction but patient remains noncompliant Cardiology/Dr. Sharyn Lull recommended cardiac follow-up after discharge and likely will need follow-up echo-if EF remains 30% or less would need to be referred to EP clinic for consideration for ICD implantation 9/17 asked staff to begin educating patient regarding management of CHF.   Dry weight unclear but appears to be between 216 and 220 pounds.  Depression/paranoid schizophrenia/major neurocognitive disorder (dementia nonprogressive): Resolved, seen by psychiatry early during the hospitalization.  Recommended to continue BuSpar, Invega and Seroquel.   9/30 changed as needed Klonopin to scheduled Xanax twice daily per preadmission regimen Formal neuro psychiatric cognitive evaluation completed on 9/23 with confirmation of a diagnosis of dementia as well as PTSD in addition to known schizophrenia. Recommendation is for skilled nursing. In regards to patient's PTSD patient reports that when he was 68 he and his partner had a child together and 2 days after the child was born his partner unexpectedly died.  He states he has never dealt with this and is requesting outpatient psychotherapy after discharge.  Recurrent dental abscesses status post full dental extraction on 8/26 Recurrent.  Initial episode in July that responded to IV antibiotics recurrent episode in August.  Both with confirmed dental abscess orthopantogram in both episodes responsive to IV Unasyn. Status post full dental extraction on 8/26.  Need to follow-up with dental surgery after discharge to ensure appropriate  wound healing and evaluation for dentures  Insomnia  Continue Ambien 10 mg as needed Continue melatonin   Seizure disorder: Stable Secondary to hyponatremia and issues of nonadherence with medications prior to admission.   Dilantin level subtherapeutic on 7/18 at 2.6  Patient has undergone several up titrations in his Dilantin dosage.  8/27 follow-up Dilantin level 12.9-unless showed signs of toxicity no indication to repeat Dilantin level for at least 6 weeks  Microcytic anemia Iron studies relatively normal, hemoglobin stable If has significant bleeding postoperatively may need to repeat CBC for several days.  OSA Continue nocturnal CPAP-changing to different mask briefly improve CPAP compliance but currently is not wearing consistently  COPD: stable. As needed bronchodilators  Hyperlipidemia: Daily Lipitor 40 mg  Hypokalemia/hypomagnesemia Replace as needed  Peripheral neuropathy Changed gabapentin to Lyrica 50 mg TID and subsequently titrated up to 75 mg Repeat B12 level 401  Modest obesity Estimated body mass index is 34.21 kg/m as calculated from the following:   Height as of this encounter: 5\' 8"  (1.727 m).   Weight as of this encounter: 102.1 kg.      Data Reviewed: Basic Metabolic Panel: Recent Labs  Lab 05/31/20 0327  NA 126*  K 3.9  CL 97*  CO2 24  GLUCOSE 92  BUN 11  CREATININE 0.54*  CALCIUM 9.1   Liver Function Tests: No results for input(s): AST, ALT, ALKPHOS, BILITOT, PROT, ALBUMIN in the last 168 hours. No results for input(s): LIPASE, AMYLASE in the last 168 hours. No results for input(s): AMMONIA in the last 168 hours. CBC: No results for input(s): WBC, NEUTROABS, HGB, HCT, MCV, PLT in the last 168 hours. Cardiac Enzymes: No results for input(s): CKTOTAL, CKMB, CKMBINDEX, TROPONINI in the last 168 hours. BNP (last 3 results) Recent Labs    01/22/20 0410 02/24/20 0030 05/08/20 0827  BNP 697.9* 184.7* 48.2    ProBNP (last  3 results) No results for input(s): PROBNP in the last 8760 hours.  CBG: No results for input(s): GLUCAP in the last 168 hours.  No results found for this or any previous visit (from the past 240 hour(s)).   Studies: No results found.  Scheduled Meds: . ALPRAZolam  0.5 mg Oral BID  . atorvastatin  40 mg Oral Daily  . busPIRone  10 mg Oral TID  . carvedilol  3.125 mg Oral BID WC  . chlorhexidine  15 mL Mouth Rinse BID  . digoxin  0.125 mg Oral Daily  . fluticasone furoate-vilanterol  1 puff Inhalation Daily  . furosemide  40 mg Oral Daily  . magnesium oxide  400 mg Oral Daily  . phenytoin  300 mg Oral BID  . pregabalin  75 mg Oral TID  . QUEtiapine  200 mg Oral Daily  . sacubitril-valsartan  1 tablet Oral BID  . tuberculin  5 Units Intradermal Once   Continuous Infusions:   Principal Problem:   Hyponatremia Active Problems:   Chronic systolic CHF (congestive heart failure) (HCC)   Mixed hyperlipidemia   COPD (chronic obstructive pulmonary disease) (HCC)   Seizure disorder (HCC)   Paranoid schizophrenia (HCC)   Hypokalemia   Hypomagnesemia   Suicidal ideation   Noncompliance with medications   Subtherapeutic serum dilantin level   Seizure (HCC)   Chronic post-traumatic stress disorder (PTSD)        Consultants:  Dentist/oral surgeon  Procedures:  None  Antibiotics: Anti-infectives (From admission, onward)   Start     Dose/Rate Route Frequency Ordered Stop   05/02/20 1100  amoxicillin-clavulanate (AUGMENTIN) 875-125 MG per tablet  1 tablet  Status:  Discontinued        1 tablet Oral Every 12 hours 05/02/20 1048 05/02/20 1048   05/02/20 1100  amoxicillin-clavulanate (AUGMENTIN) 875-125 MG per tablet 1 tablet        1 tablet Oral Every 12 hours 05/02/20 1048 05/03/20 2143   04/29/20 0800  ceFAZolin (ANCEF) IVPB 2g/100 mL premix        2 g 200 mL/hr over 30 Minutes Intravenous On call to O.R. 04/27/20 1814 04/29/20 0918   04/24/20 2100   Ampicillin-Sulbactam (UNASYN) 3 g in sodium chloride 0.9 % 100 mL IVPB  Status:  Discontinued        3 g 200 mL/hr over 30 Minutes Intravenous Every 6 hours 04/24/20 1936 05/02/20 1048   04/02/20 2200  amoxicillin-clavulanate (AUGMENTIN) 875-125 MG per tablet 1 tablet        1 tablet Oral Every 12 hours 04/02/20 1405 04/04/20 2130   03/29/20 0930  Ampicillin-Sulbactam (UNASYN) 3 g in sodium chloride 0.9 % 100 mL IVPB  Status:  Discontinued        3 g 200 mL/hr over 30 Minutes Intravenous Every 6 hours 03/29/20 0915 04/02/20 1405       Time spent: 15 minutes    Junious Silk ANP  [facility] Pager [phone]. If 7PM-7AM, please contact night-coverage at [email or web address] 06/04/2020, 10:01 AM  LOS: 102 days

## 2020-06-05 NOTE — Progress Notes (Signed)
[facility] PROGRESS NOTE    Jim Dunn  GGE:[id number] DOB: [birth year] DOA: 02/23/2020 PCP: Pcp, No      Brief Narrative:  Jim Dunn is a 54 y.o. M with hx schizophrenia, seizures, as CHF, tricuspid regurg, and COPD who presented with suicidal ideation and seizure.           Assessment & Plan:  Chronic systolic and diastolic CHF -Continue atorvastatin, carvedilol, digoxin, furosemide, Entresto  Hyponatremia Mild, stable, at baseline  Schizoaffective disorder -Continue Xanax, BuSpar, Seroquel  Epilepsy -Continue phenytoin, Lyrica  Apnea -Continue CPAP  COPD No active bronchospasm -Continue ICS/LABA      Disposition: Status is: Inpatient  Remains inpatient appropriate because:Unsafe d/c plan   Dispo: The patient is from: Homeless              Anticipated d/c is to: ALF              Anticipated d/c date is: 1 day              Patient currently is medically stable to d/c.              MDM: The below labs and imaging reports were reviewed and summarized above.  Medication management as above.   DVT prophylaxis: SCDs Start: 04/29/20 1219 Place and maintain sequential compression device Start: 04/08/20 1416  Code Status: Full code Family Communication:             Subjective: Patient has no headache, chest pain, palpitations, dyspnea, leg swelling.  No fever.  Objective: Vitals:   06/05/20 0348 06/05/20 0748 06/05/20 0754 06/05/20 1534  BP: 132/78  (!) 143/92 (!) 141/76  Pulse: 66  69 86  Resp: 18  18 18   Temp: 97.7 F (36.5 C)  97.6 F (36.4 C) 98.2 F (36.8 C)  TempSrc: Oral  Oral Oral  SpO2: 98% 99% 100% 94%  Weight: 101.1 kg     Height:        Intake/Output Summary (Last 24 hours) at 06/05/2020 1636 Last data filed at 06/05/2020 0348 Gross per 24 hour  Intake --  Output 1400 ml  Net -1400 ml   Filed Weights   06/03/20 0422 06/04/20 0219 06/05/20 0348  Weight: 101.2 kg 102.1 kg 101.1 kg     Examination: General appearance:  adult male, alert and in no acute distress.  Lying upside down in bed HEENT:  Skin:   Cardiac: RRR, no murmurs, no lower extremity edema. Respiratory: Normal respiratory rate and rhythm.  CTAB without rales or wheezes. Abdomen:  MSK: Normal muscle bulk and tone. Neuro:     Psych:     Data Reviewed: I have personally reviewed following labs and imaging studies:  CBC: No results for input(s): WBC, NEUTROABS, HGB, HCT, MCV, PLT in the last 168 hours. Basic Metabolic Panel: Recent Labs  Lab 05/31/20 0327  NA 126*  K 3.9  CL 97*  CO2 24  GLUCOSE 92  BUN 11  CREATININE 0.54*  CALCIUM 9.1   GFR: Estimated Creatinine Clearance: 124.5 mL/min (A) (by C-G formula based on SCr of 0.54 mg/dL (L)). Liver Function Tests: No results for input(s): AST, ALT, ALKPHOS, BILITOT, PROT, ALBUMIN in the last 168 hours. No results for input(s): LIPASE, AMYLASE in the last 168 hours. No results for input(s): AMMONIA in the last 168 hours. Coagulation Profile: No results for input(s): INR, PROTIME in the last 168 hours. Cardiac Enzymes: No results for input(s): CKTOTAL, CKMB, CKMBINDEX, TROPONINI in  the last 168 hours. BNP (last 3 results) No results for input(s): PROBNP in the last 8760 hours. HbA1C: No results for input(s): HGBA1C in the last 72 hours. CBG: No results for input(s): GLUCAP in the last 168 hours. Lipid Profile: No results for input(s): CHOL, HDL, LDLCALC, TRIG, CHOLHDL, LDLDIRECT in the last 72 hours. Thyroid Function Tests: No results for input(s): TSH, T4TOTAL, FREET4, T3FREE, THYROIDAB in the last 72 hours. Anemia Panel: No results for input(s): VITAMINB12, FOLATE, FERRITIN, TIBC, IRON, RETICCTPCT in the last 72 hours. Urine analysis:    Component Value Date/Time   COLORURINE YELLOW 02/23/2020 1910   APPEARANCEUR CLEAR 02/23/2020 1910   LABSPEC 1.006 02/23/2020 1910   PHURINE 7.0 02/23/2020 1910   GLUCOSEU NEGATIVE 02/23/2020  1910   HGBUR NEGATIVE 02/23/2020 1910   BILIRUBINUR NEGATIVE 02/23/2020 1910   KETONESUR NEGATIVE 02/23/2020 1910   PROTEINUR NEGATIVE 02/23/2020 1910   NITRITE NEGATIVE 02/23/2020 1910   LEUKOCYTESUR NEGATIVE 02/23/2020 1910   Sepsis Labs: @LABRCNTIP (procalcitonin:4,lacticacidven:4)  )No results found for this or any previous visit (from the past 240 hour(s)).       Radiology Studies: No results found.      Scheduled Meds:  ALPRAZolam  0.5 mg Oral BID   atorvastatin  40 mg Oral Daily   busPIRone  10 mg Oral TID   carvedilol  3.125 mg Oral BID WC   chlorhexidine  15 mL Mouth Rinse BID   digoxin  0.125 mg Oral Daily   fluticasone furoate-vilanterol  1 puff Inhalation Daily   furosemide  40 mg Oral Daily   magnesium oxide  400 mg Oral Daily   phenytoin  300 mg Oral BID   pregabalin  75 mg Oral TID   QUEtiapine  200 mg Oral Daily   sacubitril-valsartan  1 tablet Oral BID   tuberculin  5 Units Intradermal Once   Continuous Infusions:   LOS: 103 days    Time spent: 15 minutes    , MD [facility] 06/05/2020, 4:36 PM     Please page though AMION or Epic secure chat:  For 08/05/2020, [employer]

## 2020-06-06 NOTE — Plan of Care (Signed)
  Problem: Clinical Measurements: Goal: Diagnostic test results will improve Outcome: Progressing   Problem: Clinical Measurements: Goal: Complications related to the disease process, condition or treatment will be avoided or minimized Outcome: Progressing   Problem: Medication: Goal: Risk for medication side effects will decrease Outcome: Progressing   Problem: Health Behavior/Discharge Planning: Goal: Compliance with prescribed medication regimen will improve Outcome: Progressing

## 2020-06-06 NOTE — Progress Notes (Signed)
[facility] PROGRESS NOTE    Jim Dunn  [id number]:[id number] DOB: 10/18/1965 DOA: 02/23/2020 PCP: Pcp, No      Brief Narrative:  Jim Dunn is a 54 y.o. M with hx schizophrenia, seizures, as CHF, tricuspid regurg, and COPD who presented with suicidal ideation and seizure.           Assessment & Plan:  Chronic systolic and diastolic CHF -Continue atorvastatin, carvedilol, digoxin, furosemide, Entresto  Hyponatremia Mild, stable, at baseline  Schizoaffective -Continue Xanax, BuSpar, Seroquel  Epilepsy -Continue phenytoin, Lyrica  OSA -Continue CPAP  COPD No active bronchospasm -Continue daily ICS/LABA      Disposition: Status is: Inpatient  Remains inpatient appropriate because:Unsafe d/c plan   Dispo: The patient is from: Homeless              Anticipated d/c is to: ALF              Anticipated d/c date is: 1 day              Patient currently is medically stable to d/c.              MDM: The below labs and imaging reports were reviewed and summarized above.  Medication management as above.   DVT prophylaxis: SCDs Start: 04/29/20 1219 Place and maintain sequential compression device Start: 04/08/20 1416  Code Status: Full code Family Communication:             Subjective: Patient has no headache, chest pain, palpitations, dyspnea, leg swelling.  No fever.  Objective: Vitals:   06/06/20 0608 06/06/20 0803 06/06/20 0813 06/06/20 1137  BP:  129/78  134/74  Pulse:  73  72  Resp:  18  18  Temp:  98.2 F (36.8 C)  99 F (37.2 C)  TempSrc:  Oral    SpO2:  97% 97% 97%  Weight: 100.7 kg     Height:        Intake/Output Summary (Last 24 hours) at 06/06/2020 1447 Last data filed at 06/06/2020 1200 Gross per 24 hour  Intake 1050 ml  Output --  Net 1050 ml   Filed Weights   06/04/20 0219 06/05/20 0348 06/06/20 0608  Weight: 102.1 kg 101.1 kg 100.7 kg    Examination: General appearance: Adult male, lying in  bed, no acute distress HEENT:  Skin:   Cardiac:  Respiratory:  Abdomen:  MSK: Normal muscle bulk and tone, no deformities or effusions of the large joints of the upper or lower extremities bilaterally. Neuro: Extraocular movements intact, moves all extremities with normal strength and coordination, gait normal, speech fluent. Psych: Attention normal, affect normal, judgment insight appear mildly impaired.  Thought content appropriate.    Data Reviewed: I have personally reviewed following labs and imaging studies:  CBC: No results for input(s): WBC, NEUTROABS, HGB, HCT, MCV, PLT in the last 168 hours. Basic Metabolic Panel: Recent Labs  Lab 05/31/20 0327  NA 126*  K 3.9  CL 97*  CO2 24  GLUCOSE 92  BUN 11  CREATININE 0.54*  CALCIUM 9.1   GFR: Estimated Creatinine Clearance: 124.2 mL/min (A) (by C-G formula based on SCr of 0.54 mg/dL (L)). Liver Function Tests: No results for input(s): AST, ALT, ALKPHOS, BILITOT, PROT, ALBUMIN in the last 168 hours. No results for input(s): LIPASE, AMYLASE in the last 168 hours. No results for input(s): AMMONIA in the last 168 hours. Coagulation Profile: No results for input(s): INR, PROTIME in the last 168 hours. Cardiac  Enzymes: No results for input(s): CKTOTAL, CKMB, CKMBINDEX, TROPONINI in the last 168 hours. BNP (last 3 results) No results for input(s): PROBNP in the last 8760 hours. HbA1C: No results for input(s): HGBA1C in the last 72 hours. CBG: No results for input(s): GLUCAP in the last 168 hours. Lipid Profile: No results for input(s): CHOL, HDL, LDLCALC, TRIG, CHOLHDL, LDLDIRECT in the last 72 hours. Thyroid Function Tests: No results for input(s): TSH, T4TOTAL, FREET4, T3FREE, THYROIDAB in the last 72 hours. Anemia Panel: No results for input(s): VITAMINB12, FOLATE, FERRITIN, TIBC, IRON, RETICCTPCT in the last 72 hours. Urine analysis:    Component Value Date/Time   COLORURINE YELLOW 02/23/2020 1910   APPEARANCEUR  CLEAR 02/23/2020 1910   LABSPEC 1.006 02/23/2020 1910   PHURINE 7.0 02/23/2020 1910   GLUCOSEU NEGATIVE 02/23/2020 1910   HGBUR NEGATIVE 02/23/2020 1910   BILIRUBINUR NEGATIVE 02/23/2020 1910   KETONESUR NEGATIVE 02/23/2020 1910   PROTEINUR NEGATIVE 02/23/2020 1910   NITRITE NEGATIVE 02/23/2020 1910   LEUKOCYTESUR NEGATIVE 02/23/2020 1910   Sepsis Labs: @LABRCNTIP (procalcitonin:4,lacticacidven:4)  )No results found for this or any previous visit (from the past 240 hour(s)).       Radiology Studies: No results found.      Scheduled Meds: . ALPRAZolam  0.5 mg Oral BID  . atorvastatin  40 mg Oral Daily  . busPIRone  10 mg Oral TID  . carvedilol  3.125 mg Oral BID WC  . chlorhexidine  15 mL Mouth Rinse BID  . digoxin  0.125 mg Oral Daily  . fluticasone furoate-vilanterol  1 puff Inhalation Daily  . furosemide  40 mg Oral Daily  . magnesium oxide  400 mg Oral Daily  . phenytoin  300 mg Oral BID  . pregabalin  75 mg Oral TID  . QUEtiapine  200 mg Oral Daily  . sacubitril-valsartan  1 tablet Oral BID  . tuberculin  5 Units Intradermal Once   Continuous Infusions:   LOS: 104 days    Time spent: 15 minutes    , MD [facility] 06/06/2020, 2:47 PM     Please page though AMION or Epic secure chat:  For 08/06/2020, [employer]

## 2020-06-07 LAB — RESPIRATORY PANEL BY RT PCR (FLU A&B, COVID)
Influenza A by PCR: NEGATIVE
Influenza B by PCR: NEGATIVE
SARS Coronavirus 2 by RT PCR: NEGATIVE

## 2020-06-07 MED ORDER — FLUTICASONE FUROATE-VILANTEROL 200-25 MCG/INH IN AEPB: 1.0000 | INHALATION_SPRAY | Freq: Every day | RESPIRATORY_TRACT | 2 refills | Status: DC

## 2020-06-07 MED ORDER — ATORVASTATIN CALCIUM 40 MG PO TABS
40.0000 mg | ORAL_TABLET | Freq: Every day | ORAL | 2 refills | Status: DC
Start: 2020-06-07 — End: 2021-08-03

## 2020-06-07 MED ORDER — QUETIAPINE FUMARATE 200 MG PO TABS
200.0000 mg | ORAL_TABLET | Freq: Every day | ORAL | 2 refills | Status: DC
Start: 2020-06-08 — End: 2021-07-28

## 2020-06-07 MED ORDER — FUROSEMIDE 40 MG PO TABS
40.0000 mg | ORAL_TABLET | Freq: Every day | ORAL | 2 refills | Status: DC
Start: 2020-06-08 — End: 2021-08-02

## 2020-06-07 MED ORDER — MAGNESIUM OXIDE 400 (241.3 MG) MG PO TABS
400.0000 mg | ORAL_TABLET | Freq: Every day | ORAL | 2 refills | Status: DC
Start: 2020-06-08 — End: 2021-08-02

## 2020-06-07 MED ORDER — ALPRAZOLAM 0.5 MG PO TABS: 0.5000 mg | ORAL_TABLET | Freq: Two times a day (BID) | ORAL | 0 refills | Status: DC

## 2020-06-07 MED ORDER — ZOLPIDEM TARTRATE 10 MG PO TABS: 10.0000 mg | ORAL_TABLET | Freq: Every evening | ORAL | 0 refills | Status: DC | PRN

## 2020-06-07 MED ORDER — DIGOXIN 125 MCG PO TABS
0.1250 mg | ORAL_TABLET | Freq: Every day | ORAL | 2 refills | Status: DC
Start: 2020-06-07 — End: 2021-08-02

## 2020-06-07 MED ORDER — PHENYTOIN SODIUM EXTENDED 300 MG PO CAPS: 300.0000 mg | ORAL_CAPSULE | Freq: Two times a day (BID) | ORAL | 2 refills | Status: DC

## 2020-06-07 MED ORDER — PREGABALIN 75 MG PO CAPS: 75.0000 mg | ORAL_CAPSULE | Freq: Three times a day (TID) | ORAL | 2 refills | Status: DC

## 2020-06-07 MED ORDER — BUSPIRONE HCL 10 MG PO TABS
10.0000 mg | ORAL_TABLET | Freq: Three times a day (TID) | ORAL | 2 refills | Status: DC
Start: 2020-06-07 — End: 2021-08-03

## 2020-06-07 MED ORDER — ACETAMINOPHEN 325 MG PO TABS: 650.0000 mg | ORAL_TABLET | ORAL | Status: DC | PRN

## 2020-06-07 MED ORDER — IPRATROPIUM-ALBUTEROL 20-100 MCG/ACT IN AERS: 1.0000 | INHALATION_SPRAY | Freq: Four times a day (QID) | RESPIRATORY_TRACT | 2 refills | Status: DC | PRN

## 2020-06-07 MED ORDER — MELATONIN 3 MG PO TABS
3.0000 mg | ORAL_TABLET | Freq: Every evening | ORAL | 2 refills | Status: DC | PRN
Start: 2020-06-07 — End: 2020-07-22

## 2020-06-07 MED ORDER — INVEGA SUSTENNA 234 MG/1.5ML IM SUSY: 234.0000 mg | PREFILLED_SYRINGE | INTRAMUSCULAR | 2 refills | Status: DC

## 2020-06-07 MED ORDER — SACUBITRIL-VALSARTAN 24-26 MG PO TABS: 1.0000 | ORAL_TABLET | Freq: Two times a day (BID) | ORAL | 2 refills | Status: DC

## 2020-06-07 NOTE — Progress Notes (Signed)
[facility] PROGRESS NOTE  Jim Dunn TJQ:[id number] DOB: [birth year] DOA: 02/23/2020 PCP: Pcp, No  Status: Inpatient---Remains inpatient appropriate because:Altered mental status and Unsafe d/c plan   Dispo: The patient is from: Home              Anticipated d/c is to: ALF               Anticipated d/c date is: > 3 days-10/4 or 10/5              Patient currently is not medically stable to d/c. **NEEDS SAFE DC PLAN** Currently awaiting placement, TOC team working on this.  Mother is currently working to obtain Medicaid/guardianship. Unsafe for discharge based on underlying schizophrenia and history of recurrent noncompliance which led to patient's current hospitalization.   Based on clinical evaluation as well as screening tests during the hospitalization patient appears to have neurocognitive abnormalities. Formal neuropsychiatric consultation completed on 9/23 with patient demonstrating multiple cognitive deficits/impairments particularly related to inability to store and organize newly learned information. This evaluation is consistent with a diagnosis of dementia. Also has known schizophrenia and clinical features of PTSD.  10/1 patient has received a bed offer and his mother plans on touring the facility.  TB skin test has been ordered.  Mother work toward facility today on 10/4 and if she is agreeable to this facility hopeful discharge will be achieved by the end of this week.  Code Status: Full Family Communication: 9/24 updated [name] [phone] or [phone]] DVT prophylaxis: SCDs since ambulating without difficulty Vaccination status: Vaccinated   HPI (time of admission): 54 year old male with past medical history of schizophrenia, seizure disorder, biventricular heart failure(Echo 01/2020 EF 25-30%with right-sided heart failure),severe tricuspid regurgitation, nicotine dependence and COPD who presents to [facility] emergency department with  suicidal ideation and seizure.Patient explains that since he left the hospital several days ago(admitted6/17-6/19-was being managed for hyponatremia, thought to be multifactorial in origin. Patient left AMA on6/19 and that time had sodium of 132),he has been experiencing increasing fatigue. This increasing fatigue is associated with bilateral lower extremity tingling. Symptomsweresimilar to what prompted his presentation on 6/17. Additionally, he reported feeling depressed withsuicidal ideation, withouta plan. Patient reportedthat he hadattempted suicide in the distant past using pills but was unsuccessful and never sought medical attention. While the patient was waiting in the waiting room, he experienced a witnessed seizure while attempting to walk to the bathroom. Patient was immediately brought back to the emergency department for further evaluation. Patient was found to have substantial recurrent hyponatremia of 117 with concurrent hypokalemia of 2.6 and hypomagnesium of 1.4. Patient was administered 1236mg  of intravenous Fosphenytoinand initiated on low rate normal saline infusion. Patient was administered 2 g of intravenous magnesium and a 30 mEq of potassium.  Patient's electrolytes have improved, no further seizure episodes at this time.  Echo showed EF of 25-30%, cardiology recommended outpatient follow-up but otherwise continue Coreg, torsemide and Entresto.   In addition to the above patient was noted to have significant difficulties with impaired memory and consistent application of skills necessary to perform IADLs in the outpatient setting. Screening confirmed suspicions. On arrival psychological evaluation completed on 9/23 which confirms a diagnosis of dementia as well as PTSD with recommendation for skilled nursing to ensure patient's safety and stability.   Subjective: Alert, sitting up in bed.  Informed me that his mother were toward the ALF facility  today  Objective: Vitals:   06/07/20 0813 06/07/20 0813  BP:  129/82 129/82  Pulse: 83 83  Resp: 20 20  Temp: 98.3 F (36.8 C) 98.3 F (36.8 C)  SpO2: 98% 98%    Intake/Output Summary (Last 24 hours) at 06/07/2020 1041 Last data filed at 06/07/2020 0900 Gross per 24 hour  Intake 690 ml  Output --  Net 690 ml   Filed Weights   06/04/20 0219 06/05/20 0348 06/06/20 0608  Weight: 102.1 kg 101.1 kg 100.7 kg    Exam: Constitutional: NAD, pleasant, comfortable Respiratory: clear to auscultation bilaterally on posterior exam, Normal respiratory effort at rest.  Room air Cardiovascular: Regular rate, no murmurs / rubs / gallops. No lower extremity edema.  No JVD.  Abdomen: no tenderness, Bowel sounds positive.  Increasing abdominal girth Neurologic: CN 2-12 grossly intact. Sensation intact,Strength 5/5 x all 4 extremities. Ambulates independently but does have some balance difficulties when initially getting out of bed while sleepy as documented by PT/OT Psychiatric: alert and oriented x name, year and place although does not have appropriate insight or capacity to manage care independently   Assessment/Plan: Hyponatremia with seizures/multifactorial etiology: 1. Intravascular dehydration, resolved 2.  SIADH 3.  Chronic systolic heart failure intermittent volume overload At time of admission diuretics torsemide & metolazone were held and subsequently discontinued in context of severe dehydration.   Stable on Lasix 40 mg daily SIADH labs c/w SIADH physiology.  Continue fluid restriction - patient repeatedly educated on importance of maintaining fluid restriction Sodium stable at 125 - 126 Suspect patient's baseline sodium will be in the 120s  Chronic congestive heart failure with reduced ejection fraction, 25%.-Associated right heart failure.   Echo May 2021-EF 25-30% Continue Coreg, Entresto, Lasix along with Daily weights with strict I/O  Unable to uptitrate Entresto due to  lower range BP readings Counseled extensively by multiple providers regarding need to adhere to salt restricted diet well as fluid restriction but patient remains noncompliant Cardiology/Dr. Sharyn Lull recommended cardiac follow-up after discharge and likely will need follow-up echo-if EF remains 30% or less would need to be referred to EP clinic for consideration for ICD implantation  Dry weight unclear but appears to be between 216 and 220 pounds.  Depression/paranoid schizophrenia/major neurocognitive disorder (dementia nonprogressive): Resolved, seen by psychiatry early during the hospitalization.  Recommended to continue BuSpar, Invega and Seroquel.   9/30 changed as needed Klonopin to scheduled Xanax twice daily per preadmission regimen Formal neuro psychiatric cognitive evaluation completed on 9/23 with confirmation of a diagnosis of dementia as well as PTSD in addition to known schizophrenia. Recommendation is for skilled nursing. In regards to patient's PTSD patient reports that when he was 13 he and his partner had a child together and 2 days after the child was born his partner unexpectedly died.  He states he has never dealt with this and is requesting outpatient psychotherapy after discharge.  Recurrent dental abscesses status post full dental extraction on 8/26 Recurrent.  Initial episode in July that responded to IV antibiotics recurrent episode in August.  Both with confirmed dental abscess orthopantogram in both episodes responsive to IV Unasyn. Status post full dental extraction on 8/26.  Need to follow-up with dental surgery after discharge to ensure appropriate wound healing and evaluation for dentures  Insomnia Continue Ambien 10 mg as needed Continue melatonin   Seizure disorder: Stable Secondary to severe hyponatremia and issues of nonadherence with medications prior to admission.   Dilantin level subtherapeutic on 7/18 at 2.6  Patient has undergone several up titrations  in his Dilantin dosage.  8/27 follow-up Dilantin level 12.9-we will obtain Dilantin level in a.m. on 10/5 prior to anticipated discharge  Microcytic anemia Iron studies relatively normal, hemoglobin stable If has significant bleeding postoperatively may need to repeat CBC for several days.  OSA Continue nocturnal CPAP-changing to different mask briefly improve CPAP compliance but currently is not wearing consistently  COPD: stable. Continue prn bronchodilators  Hyperlipidemia: Daily Lipitor 40 mg  Hypokalemia/hypomagnesemia Replace as needed  Peripheral neuropathy Cont Lyrica 75 mg TID Repeat B12 level 401  Modest obesity Estimated body mass index is 33.74 kg/m as calculated from the following:   Height as of this encounter: 5\' 8"  (1.727 m).   Weight as of this encounter: 100.7 kg.      Data Reviewed: Basic Metabolic Panel: No results for input(s): NA, K, CL, CO2, GLUCOSE, BUN, CREATININE, CALCIUM, MG, PHOS in the last 168 hours. Liver Function Tests: No results for input(s): AST, ALT, ALKPHOS, BILITOT, PROT, ALBUMIN in the last 168 hours. No results for input(s): LIPASE, AMYLASE in the last 168 hours. No results for input(s): AMMONIA in the last 168 hours. CBC: No results for input(s): WBC, NEUTROABS, HGB, HCT, MCV, PLT in the last 168 hours. Cardiac Enzymes: No results for input(s): CKTOTAL, CKMB, CKMBINDEX, TROPONINI in the last 168 hours. BNP (last 3 results) Recent Labs    01/22/20 0410 02/24/20 0030 05/08/20 0827  BNP 697.9* 184.7* 48.2    ProBNP (last 3 results) No results for input(s): PROBNP in the last 8760 hours.  CBG: No results for input(s): GLUCAP in the last 168 hours.  No results found for this or any previous visit (from the past 240 hour(s)).   Studies: No results found.  Scheduled Meds: . ALPRAZolam  0.5 mg Oral BID  . atorvastatin  40 mg Oral Daily  . busPIRone  10 mg Oral TID  . carvedilol  3.125 mg Oral BID WC  .  chlorhexidine  15 mL Mouth Rinse BID  . digoxin  0.125 mg Oral Daily  . fluticasone furoate-vilanterol  1 puff Inhalation Daily  . furosemide  40 mg Oral Daily  . magnesium oxide  400 mg Oral Daily  . phenytoin  300 mg Oral BID  . pregabalin  75 mg Oral TID  . QUEtiapine  200 mg Oral Daily  . sacubitril-valsartan  1 tablet Oral BID   Continuous Infusions:   Principal Problem:   Hyponatremia Active Problems:   Chronic systolic CHF (congestive heart failure) (HCC)   Mixed hyperlipidemia   COPD (chronic obstructive pulmonary disease) (HCC)   Seizure disorder (HCC)   Paranoid schizophrenia (HCC)   Hypokalemia   Hypomagnesemia   Suicidal ideation   Noncompliance with medications   Subtherapeutic serum dilantin level   Seizure (HCC)   Chronic post-traumatic stress disorder (PTSD)        Consultants:  Dentist/oral surgeon  Procedures:  None  Antibiotics: Anti-infectives (From admission, onward)   Start     Dose/Rate Route Frequency Ordered Stop   05/02/20 1100  amoxicillin-clavulanate (AUGMENTIN) 875-125 MG per tablet 1 tablet  Status:  Discontinued        1 tablet Oral Every 12 hours 05/02/20 1048 05/02/20 1048   05/02/20 1100  amoxicillin-clavulanate (AUGMENTIN) 875-125 MG per tablet 1 tablet        1 tablet Oral Every 12 hours 05/02/20 1048 05/03/20 2143   04/29/20 0800  ceFAZolin (ANCEF) IVPB 2g/100 mL premix        2 g 200 mL/hr over 30 Minutes Intravenous  On call to O.R. 04/27/20 1814 04/29/20 0918   04/24/20 2100  Ampicillin-Sulbactam (UNASYN) 3 g in sodium chloride 0.9 % 100 mL IVPB  Status:  Discontinued        3 g 200 mL/hr over 30 Minutes Intravenous Every 6 hours 04/24/20 1936 05/02/20 1048   04/02/20 2200  amoxicillin-clavulanate (AUGMENTIN) 875-125 MG per tablet 1 tablet        1 tablet Oral Every 12 hours 04/02/20 1405 04/04/20 2130   03/29/20 0930  Ampicillin-Sulbactam (UNASYN) 3 g in sodium chloride 0.9 % 100 mL IVPB  Status:  Discontinued        3  g 200 mL/hr over 30 Minutes Intravenous Every 6 hours 03/29/20 0915 04/02/20 1405       Time spent: 15 minutes    Junious Silk ANP  [facility] Pager [phone]. If 7PM-7AM, please contact night-coverage at [email or web address] 06/07/2020, 10:41 AM  LOS: 105 days

## 2020-06-07 NOTE — TOC Progression Note (Signed)
Transition of Care [facility]) - Progression Note    Patient Details  Name: Jim Dunn MRN: [id number] Date of Birth: Jan 24, 1966  [facility] of [facility]) CM/SW Contact  Kermit Balo, RN Phone Number: 06/07/2020, 12:04 PM  Clinical Narrative:    CM spoke with Oretha Ellis at [facility] and the tour with Mr Sharrar mother went well. Plan is for a covid test today and d/c to [facility]. Pt, MD and bedside RN updated.  TOC following.   Expected Discharge Plan: Assisted Living Barriers to Discharge: Continued Medical Work up, Unsafe home situation  Expected Discharge Plan and Services Expected Discharge Plan: Assisted Living       Living arrangements for the past 2 months: Single Family Home                                       Social Determinants of Health (SDOH) Interventions    Readmission Risk Interventions Readmission Risk Prevention Plan 01/23/2020  Transportation Screening Complete  HRI or Home Care Consult Complete  Social Work Consult for Recovery Care Planning/Counseling Complete  Palliative Care Screening Not Applicable  Medication Review Ocean[employer]) (No Data)

## 2020-06-08 DIAGNOSIS — F0390 Unspecified dementia without behavioral disturbance: Secondary | ICD-10-CM | POA: Diagnosis present

## 2020-06-08 DIAGNOSIS — E222 Syndrome of inappropriate secretion of antidiuretic hormone: Secondary | ICD-10-CM | POA: Diagnosis present

## 2020-06-08 LAB — PHENYTOIN LEVEL, TOTAL: Phenytoin Lvl: 16.2 ug/mL (ref 10.0–20.0)

## 2020-06-08 NOTE — Discharge Summary (Signed)
Physician Discharge Summary  Jim Dunn JXB:[id number] DOB: April 01, 1966 DOA: 02/23/2020  PCP: Pcp, No  Admit date: 02/23/2020 Discharge date: 06/08/2020  Time spent: 55 minutes  Recommendations for Outpatient Follow-up:  1. Patient has an unconfirmed diagnosis of obstructive sleep apnea.  During the hospitalization CPAP was utilized but because he does not have a formal diagnosis unable to be prescribed after discharge.  Please refer this patient to a local sleep lab so he can undergo a polysomnogram to confirm this diagnosis 2. Patient will also need to be referred to a psychologist regarding his PTSD diagnosis 3. Patient will discharge to [facility] 4. Patient requires a cane for ambulation 5. Because of persistent hyponatremia in context of CHF as well as SIADH patient will need a follow-up electrolyte panel in 1 week after admission and at the discretion of the facility physician thereafter.  His baseline sodium ranges between 122 and 127 6. Patient needs to follow up with Dr. Kristin Bruins regarding follow-up from full dental extraction and need for eventual dentures 7. Patient needs to follow-up with cardiologist Dr. Sharyn Lull in 2 to 4 weeks.  He will need eventual follow-up echocardiogram EF remains less than 30% will likely need to be referred for ICD placement   Discharge Diagnoses:  Active Problems:   Chronic systolic CHF (congestive heart failure) (HCC)   Mixed hyperlipidemia   COPD (chronic obstructive pulmonary disease) (HCC)   Seizure disorder (HCC)   Hyponatremia   Paranoid schizophrenia (HCC)   Hypokalemia   Hypomagnesemia   Suicidal ideation   Noncompliance with medications   Subtherapeutic serum dilantin level   Seizure (HCC)   Chronic post-traumatic stress disorder (PTSD)   Dementia (HCC)   SIADH (syndrome of inappropriate ADH production) (HCC)   Discharge Condition: Stable  Diet recommendation: Dysphagia 3 with 1200 cc fluid restriction  Filed Weights    06/06/20 0608 06/07/20 0315 06/08/20 0613  Weight: 100.7 kg 102.5 kg 102.5 kg    History of present illness:  54 year old male with past medical history of schizophrenia, seizure disorder, biventricular heart failure(Echo 01/2020 EF 25-30%with right-sided heart failure),severe tricuspid regurgitation, nicotine dependence and COPD who presents to [facility] emergency department with suicidal ideation and seizure.Patient explains that since he left the hospital several days ago(admitted6/17-6/19-was being managed for hyponatremia, thought to be multifactorial in origin. Patient left AMA on6/19 and that time had sodium of 132),he has been experiencing increasing fatigue. This increasing fatigue is associated with bilateral lower extremity tingling. Symptomsweresimilar to what prompted his presentation on 6/17. Additionally, he reported feeling depressed withsuicidal ideation, withouta plan. Patient reportedthat he hadattempted suicide in the distant past using pills but was unsuccessful and never sought medical attention. While the patient was waiting in the waiting room, he experienced a witnessed seizure while attempting to walk to the bathroom. Patient was immediately brought back to the emergency department for further evaluation. Patient was found to have substantial recurrent hyponatremia of 117 with concurrent hypokalemia of 2.6 and hypomagnesium of 1.4. Patient was administered  of intravenous Fosphenytoinand initiated on low rate normal saline infusion. Patient was administered 2 g of intravenous magnesium and a 30 mEq of potassium.Patient's electrolytes have improved, no further seizure episodes at this time. Echo showed EF of 25-30%, cardiology recommended outpatient follow-up but otherwise continue Coreg, torsemide and Entresto.   In addition to the above patient was noted to have significant difficulties with impaired memory and consistent application of  skills necessary to perform IADLs in the outpatient setting. Screening  confirmed suspicions.  Neurocognitive psychological evaluation completed on 9/23 which confirmed a diagnosis of dementia as well as PTSD with recommendation for skilled nursing to ensure patient's safety and stability.  Hospital Course:  Hyponatremia with seizures/multifactorial etiology: 1. Intravascular dehydration,resolved 2.  SIADH 3.  Chronic systolic heart failure intermittent volume overload At time of admission diuretics (torsemide & metolazone) were held and subsequently discontinued in context of severe dehydration.  Stable on Lasix 40 mg daily SIADH labs c/w SIADH physiology.  Continue fluid restriction - patient repeatedly educated on importance of maintaining fluid restriction Sodium stable at 125 - 126 Suspect patient's baseline sodium will be in the 120s  Chronic congestive heart failure with reduced ejection fraction, 25%.-Associated right heart failure.   Echo May 2021-EF 25-30% Continue Coreg, Lanoxin, Entresto, Lasix along with Daily weights with strict I/O  Unable to uptitrate Entresto due to lower range BP readings Counseled extensively by multiple providers regarding need to adhere to salt restricted diet well as fluid restriction but patient remains noncompliant Cardiology/Dr. Sharyn Lull recommended cardiac follow-up after discharge and likely will need follow-up echo-if EF remains 30% or less would need to be referred to EP clinic for consideration for ICD implantation  Dry weight unclear but appears to be between 216 and 220 pounds.  Depression/paranoid schizophrenia/major neurocognitive disorder (dementia nonprogressive): Resolved, seen by psychiatry early during the hospitalization. Recommended to continue BuSpar, Invega and Seroquel.  9/30 changed as needed Klonopin to scheduled Xanax twice daily per preadmission regimen Formal neuro psychiatric cognitive evaluation completed on 9/23 with  confirmation of a diagnosis of dementia as well as PTSD in addition to known schizophrenia. Recommendation is for skilled nursing. In regards to patient's PTSD patient reports that when he was 77 he and his partner had a child together and 2 days after the child was born his partner unexpectedly died.  He states he has never dealt with this and is requesting outpatient psychotherapy after discharge.  Recurrent dental abscesses status post full dental extraction on 8/26 Recurrent.  Initial episode in July that responded to IV antibiotics recurrent episode in August.  Both with confirmed dental abscess orthopantogram in both episodes responsive to IV Unasyn. Status post full dental extraction on 8/26.  Need to follow-up with dental surgery/Dr Kristin Bruins after discharge to ensure appropriate wound healing and evaluation for dentures  Insomnia Continue Ambien 10 mg as needed Continue melatonin   Seizure disorder:Stable Secondary to severe hyponatremia and issues of nonadherence with medications prior to admission.  Dilantin level subtherapeutic on 7/18 at 2.6  Patient has undergone several up titrations in his Dilantin dosage.   8/27 follow-up Dilantin level 12.9-we will obtain Dilantin level on 10/5 is therapeutic at 16.2  Microcytic anemia Iron studies relatively normal, hemoglobin stable  OSA Continue nocturnal CPAP-changing to different mask briefly improve CPAP compliance but currently is not wearing consistently  COPD: stable. Continue prn bronchodilators  Hyperlipidemia: Daily Lipitor 40 mg  Hypokalemia/hypomagnesemia Replace as needed  Peripheral neuropathy Cont Lyrica 75 mg TID Repeat B12 level 401  Modest obesity Estimated body mass index is 33.74 kg/m as calculated from the following:   Height as of this encounter: 5\' 8"  (1.727 m).   Weight as of this encounter: 102.5 kg.  Procedures:  8/26 full dental extraction/Dr 9/26  5/20 2D  echocardiogram: 1. Left ventricular ejection fraction, by estimation, is 25 to 30%. The left ventricle has severely decreased function. The left ventricle demonstrates global hypokinesis. The left ventricular internal cavity size was mildly to moderately  dilated. Left ventricular diastolic parameters are indeterminate. There is the interventricular septum is flattened in diastole ('D' shaped left ventricle), consistent with right ventricular volume overload. 2. Right ventricular systolic function is moderately reduced. The right ventricular size is moderately enlarged. There is mildly elevated pulmonary artery systolic pressure. The estimated right ventricular systolic pressure is 41.4 mmHg. 3. Left atrial size was severely dilated. 4. Right atrial size was severely dilated. 5. The mitral valve is normal in structure. Moderate mitral valve regurgitation. No evidence of mitral stenosis. 6. Tricuspid valve regurgitation is severe. 7. The aortic valve is normal in structure. Aortic valve regurgitation is not visualized. No aortic stenosis is present. 8. The inferior vena cava is dilated in size with <50% respiratory variability, suggesting right atrial pressure of 15 mmHg.  Consultations:  Neurocognitive psychologist  Cardiology  Oral surgery  Discharge Exam: Vitals:   06/08/20 0718 06/08/20 0920  BP: (!) 144/89   Pulse: 69 75  Resp: 20   Temp: 97.8 F (36.6 C)   SpO2: 100%      Discharge Instructions Constitutional: NAD, pleasant, comfortable Respiratory: clear to auscultation bilaterally on posterior exam, Normal respiratory effort at rest.  Room air Cardiovascular: Regular rate, no murmurs / rubs / gallops. No lower extremity edema.  No JVD.  Abdomen: no tenderness, Bowel sounds positive.  Increasing abdominal girth Neurologic: CN 2-12 grossly intact. Sensation intact,Strength 5/5 x all 4 extremities. Ambulates independently but does have some balance difficulties when initially  getting out of bed while sleepy as documented by PT/OT Psychiatric: alert and oriented x name, year and place although does not have appropriate insight or capacity to manage care independently  Discharge Instructions    (HEART FAILURE PATIENTS) Call MD:  Anytime you have any of the following symptoms: 1) 3 pound weight gain in 24 hours or 5 pounds in 1 week 2) shortness of breath, with or without a dry hacking cough 3) swelling in the hands, feet or stomach 4) if you have to sleep on extra pillows at night in order to breathe.   Complete by: As directed    Avoid straining   Complete by: As directed    Call MD for:  difficulty breathing, headache or visual disturbances   Complete by: As directed    Call MD for:  extreme fatigue   Complete by: As directed    Call MD for:  persistant dizziness or light-headedness   Complete by: As directed    Diet general   Complete by: As directed    1200 cc/24 hours fluid restriction   Heart Failure patients record your daily weight using the same scale at the same time of day   Complete by: As directed    Increase activity slowly   Complete by: As directed    Increase activity slowly   Complete by: As directed    STOP any activity that causes chest pain, shortness of breath, dizziness, sweating, or exessive weakness   Complete by: As directed      Allergies as of 06/08/2020   No Known Allergies     Medication List    STOP taking these medications   albuterol 108 (90 Base) MCG/ACT inhaler Commonly known as: VENTOLIN HFA   arformoterol 15 MCG/2ML Nebu Commonly known as: BROVANA   budesonide 0.5 MG/2ML nebulizer solution Commonly known as: PULMICORT   carvedilol 3.125 MG tablet Commonly known as: COREG   gabapentin 300 MG capsule Commonly known as: NEURONTIN   metolazone 2.5 MG tablet  Commonly known as: ZAROXOLYN   nicotine 21 mg/24hr patch Commonly known as: NICODERM CQ - dosed in mg/24 hours   potassium chloride 10 MEQ  tablet Commonly known as: KLOR-CON   Symbicort 160-4.5 MCG/ACT inhaler Generic drug: budesonide-formoterol Replaced by: fluticasone furoate-vilanterol 200-25 MCG/INH Aepb   torsemide 20 MG tablet Commonly known as: DEMADEX     TAKE these medications   acetaminophen 325 MG tablet Commonly known as: TYLENOL Take 2 tablets (650 mg total) by mouth every 4 (four) hours as needed for mild pain or fever. What changed: reasons to take this   ALPRAZolam 0.5 MG tablet Commonly known as: XANAX Take 1 tablet (0.5 mg total) by mouth 2 (two) times daily. What changed:   when to take this  reasons to take this   atorvastatin 40 MG tablet Commonly known as: LIPITOR Take 1 tablet (40 mg total) by mouth daily.   busPIRone 10 MG tablet Commonly known as: BUSPAR Take 1 tablet (10 mg total) by mouth 3 (three) times daily.   digoxin 0.125 MG tablet Commonly known as: LANOXIN Take 1 tablet (0.125 mg total) by mouth daily.   fluticasone furoate-vilanterol 200-25 MCG/INH Aepb Commonly known as: BREO ELLIPTA Inhale 1 puff into the lungs daily. Replaces: Symbicort 160-4.5 MCG/ACT inhaler   furosemide 40 MG tablet Commonly known as: LASIX Take 1 tablet (40 mg total) by mouth daily.   Hinda Glatter Sustenna 234 MG/1.5ML Susy injection Generic drug: paliperidone Inject 234 mg into the muscle every 30 (thirty) days. What changed: how much to take   Ipratropium-Albuterol 20-100 MCG/ACT Aers respimat Commonly known as: COMBIVENT Inhale 1 puff into the lungs every 6 (six) hours as needed for wheezing or shortness of breath.   magnesium oxide 400 (241.3 Mg) MG tablet Commonly known as: MAG-OX Take 1 tablet (400 mg total) by mouth daily.   melatonin 3 MG Tabs tablet Take 1 tablet (3 mg total) by mouth at bedtime as needed (Use first for sleep).   phenytoin 300 MG ER capsule Commonly known as: DILANTIN Take 1 capsule (300 mg total) by mouth 2 (two) times daily. What changed:   medication  strength  how much to take  when to take this   pregabalin 75 MG capsule Commonly known as: LYRICA Take 1 capsule (75 mg total) by mouth 3 (three) times daily.   QUEtiapine 200 MG tablet Commonly known as: SEROQUEL Take 1 tablet (200 mg total) by mouth daily. What changed:   medication strength  how much to take   sacubitril-valsartan 24-26 MG Commonly known as: ENTRESTO Take 1 tablet by mouth 2 (two) times daily.   zolpidem 10 MG tablet Commonly known as: AMBIEN Take 1 tablet (10 mg total) by mouth at bedtime as needed (Use if melatonin not effective).            Durable Medical Equipment  (From admission, onward)         Start     Ordered   06/07/20 1358  For home use only DME continuous positive airway pressure (CPAP)  Once       Question Answer Comment  Length of Need Lifetime   Patient has OSA or probable OSA Yes   Is the patient currently using CPAP in the home No   Settings 11-15   Signs and symptoms of probable OSA  (select all that apply) Snoring   Signs and symptoms of probable OSA  (select all that apply) Witnessed apneas   CPAP supplies needed Mask, headgear,  cushions, filters, heated tubing and water chamber      06/07/20 1359   03/03/20 1257  For home use only DME Cane  Once        03/03/20 1256         No Known Allergies  Follow-up Information    Call [name], DDS.   Specialty: Dentistry Why: Call to schedule follow-up evaluation of healing and suture removal once discharged from the hospital. Contact information: [address] [place] [address] [phone]        Rinaldo Cloud, MD. Call in 2 week(s).   Specialty: Cardiology Why: Routine follow-up for systolic heart failure.  Will need follow-up echocardiogram. Contact information: [address] [place] [address] [phone]        [facility]. Schedule an appointment as soon as possible for a visit in 2 week(s).    Specialty: Pulmonology Why: Please call to make an initial appointment for patient.  He needs to establish with a sleep specialist so he can be scheduled for a formal polysomnogram to confirm sleep apnea diagnosis. Contact information: [address] [place] [address] [phone]               The results of significant diagnostics from this hospitalization (including imaging, microbiology, ancillary and laboratory) are listed below for reference.    Significant Diagnostic Studies: No results found.  Microbiology: Recent Results (from the past 240 hour(s))  Respiratory Panel by RT PCR (Flu A&B, Covid) - Nasopharyngeal Swab     Status: None   Collection Time: 06/07/20 12:43 PM   Specimen: Nasopharyngeal Swab  Result Value Ref Range Status   SARS Coronavirus 2 by RT PCR NEGATIVE NEGATIVE Final    Comment: (NOTE) SARS-CoV-2 target nucleic acids are NOT DETECTED.  The SARS-CoV-2 RNA is generally detectable in upper respiratoy specimens during the acute phase of infection. The lowest concentration of SARS-CoV-2 viral copies this assay can detect is 131 copies/mL. A negative result does not preclude SARS-Cov-2 infection and should not be used as the sole basis for treatment or other patient management decisions. A negative result may occur with  improper specimen collection/handling, submission of specimen other than nasopharyngeal swab, presence of viral mutation(s) within the areas targeted by this assay, and inadequate number of viral copies (<131 copies/mL). A negative result must be combined with clinical observations, patient history, and epidemiological information. The expected result is Negative.  Fact Sheet for Patients:  https://www.moore.com/  Fact Sheet for Healthcare Providers:  https://www.young.biz/  This test is no t yet approved or cleared by the [place] FDA and  has been  authorized for detection and/or diagnosis of SARS-CoV-2 by FDA under an Emergency Use Authorization (EUA). This EUA will remain  in effect (meaning this test can be used) for the duration of the COVID-19 declaration under Section 564(b)(1) of the Act, 21 U.S.C. section 360bbb-3(b)(1), unless the authorization is terminated or revoked sooner.     Influenza A by PCR NEGATIVE NEGATIVE Final   Influenza B by PCR NEGATIVE NEGATIVE Final    Comment: (NOTE) The Xpert Xpress SARS-CoV-2/FLU/RSV assay is intended as an aid in  the diagnosis of influenza from Nasopharyngeal swab specimens and  should not be used as a sole basis for treatment. Nasal washings and  aspirates are unacceptable for Xpert Xpress SARS-CoV-2/FLU/RSV  testing.  Fact Sheet for Patients: https://www.moore.com/  Fact Sheet for Healthcare Providers: https://www.young.biz/  This test is not  yet approved or cleared by the [name] and  has been authorized for detection and/or diagnosis of SARS-CoV-2 by  FDA under an Emergency Use Authorization (EUA). This EUA will remain  in effect (meaning this test can be used) for the duration of the  Covid-19 declaration under Section 564(b)(1) of the Act, 21  U.S.C. section 360bbb-3(b)(1), unless the authorization is  terminated or revoked. Performed at [facility], [address] [place], [place] [address]      Labs: Basic Metabolic Panel: No results for input(s): NA, K, CL, CO2, GLUCOSE, BUN, CREATININE, CALCIUM, MG, PHOS in the last 168 hours. Liver Function Tests: No results for input(s): AST, ALT, ALKPHOS, BILITOT, PROT, ALBUMIN in the last 168 hours. No results for input(s): LIPASE, AMYLASE in the last 168 hours. No results for input(s): AMMONIA in the last 168 hours. CBC: No results for input(s): WBC, NEUTROABS, HGB, HCT, MCV, PLT in the last 168 hours. Cardiac Enzymes: No results for input(s): CKTOTAL, CKMB,  CKMBINDEX, TROPONINI in the last 168 hours. BNP: BNP (last 3 results) Recent Labs    01/22/20 0410 02/24/20 0030 05/08/20 0827  BNP 697.9* 184.7* 48.2    ProBNP (last 3 results) No results for input(s): PROBNP in the last 8760 hours.  CBG: No results for input(s): GLUCAP in the last 168 hours.     Signed:  Junious Silk ANP [facility] 06/08/2020, 10:18 AM

## 2020-06-08 NOTE — TOC Transition Note (Signed)
Transition of Care [facility]) - CM/SW Discharge Note   Patient Details  Name: Jim Dunn MRN: [id number] Date of Birth: 1965/11/29  [facility] CM/SW Contact:  Kermit Balo, RN Phone Number: 06/08/2020, 10:29 AM   Clinical Narrative:    Pt is discharging to [facility] today. Pt is transporting via [employer] arranged by his mother. Information sent to the facility and CM has verified they have a bed and are ready for his admission.   Final next level of care: Assisted Living Barriers to Discharge: No Barriers Identified   Patient Goals and CMS Choice Patient states their goals for this hospitalization and ongoing recovery are:: to find a safe place to live CMS Medicare.gov Compare Post Acute Care list provided to:: Patient Choice offered to / list presented to : Patient  Discharge Placement                       Discharge Plan and Services                                     Social Determinants of Health (SDOH) Interventions     Readmission Risk Interventions Readmission Risk Prevention Plan 01/23/2020  Transportation Screening Complete  HRI or Home Care Consult Complete  Social Work Consult for Recovery Care Planning/Counseling Complete  Palliative Care Screening Not Applicable  Medication Review Oceanographer) (No Data)

## 2020-06-08 NOTE — NC FL2 (Signed)
[place] MEDICAID FL2 LEVEL OF CARE SCREENING TOOL     IDENTIFICATION  Patient Name: Jim Dunn Birthdate: 05/15/1966 Sex: male Admission Date (Current Location): 02/23/2020  [facility] and [place] Number:   Programme researcher, broadcasting/film/video)   Facility and Address:  [facility] [place], [address], [place], [place] [address]      Provider Number: [id number]  Attending Physician Name and Address:  Alberteen Sam, *  Relative Name and Phone Number:       Current Level of Care: Hospital Recommended Level of Care: Assisted Living Facility Prior Approval Number:    Date Approved/Denied:   PASRR Number:    Discharge Plan: Other (Comment) (ALF)    Current Diagnoses: Patient Active Problem List   Diagnosis Date Noted  . Dementia (HCC) 06/08/2020  . 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 Chronic post-traumatic stress disorder (PTSD)   . Noncompliance with medications   . Subtherapeutic serum dilantin level   . Seizure (HCC)   . Hypomagnesemia 02/23/2020  . Suicidal ideation 02/23/2020  . Hyponatremia 02/19/2020  . Paranoid schizophrenia (HCC)   . Hypokalemia   . Chronic systolic CHF (congestive heart failure) (HCC) 01/22/2020  . Mixed hyperlipidemia 01/22/2020  . COPD (chronic obstructive pulmonary disease) (HCC) 01/22/2020  . Microcytic anemia 01/22/2020  . Chronic hyponatremia 01/22/2020  . Seizure disorder (HCC) 01/22/2020    Orientation RESPIRATION BLADDER Height & Weight     Self, Time, Situation, Place  Normal Continent Weight: 225 lb 14.4 oz (102.5 kg) Height:  5\' 8"  (172.7 cm)  BEHAVIORAL SYMPTOMS/MOOD NEUROLOGICAL BOWEL NUTRITION STATUS      Continent Diet (heart healthy)  AMBULATORY STATUS COMMUNICATION OF NEEDS Skin   Supervision Verbally Normal                       Personal Care Assistance Level of Assistance  Bathing, Feeding, Dressing Bathing Assistance: Limited assistance Feeding assistance:  Independent Dressing Assistance: Limited assistance     Functional Limitations Info             SPECIAL CARE FACTORS FREQUENCY                       Contractures Contractures Info: Not present    Additional Factors Info  Code Status, Allergies, Psychotropic Code Status Info: Full Allergies Info: NKA Psychotropic Info: Buspar 10mg  3x/day; Seroquel 200mg  daily; Klonopin .25mg  daily         Current Medications (06/08/2020):  This is the current hospital active medication list Current Facility-Administered Medications  Medication Dose Route Frequency Provider Last Rate Last Admin  . acetaminophen (TYLENOL) tablet 650 mg  650 mg Oral Q4H PRN , DDS   650 mg at 05/25/20 2117  . ALPRAZolam 08/08/2020) tablet 0.5 mg  0.5 mg Oral BID [name], NP   0.5 mg at 06/08/20 0916  . atorvastatin (LIPITOR) tablet 40 mg  40 mg Oral Daily 2118 F, DDS   40 mg at 06/08/20 Russella Dar  . busPIRone (BUSPAR) tablet 10 mg  10 mg Oral TID 08/08/20, DDS   10 mg at 06/08/20 0915  . carvedilol (COREG) tablet 3.125 mg  3.125 mg Oral BID WC 08/08/20 F, DDS   3.125 mg at 06/08/20 0816  . chlorhexidine (PERIDEX) 0.12 % solution 15 mL  15 mL Mouth Rinse BID [name], MD   15 mL at 06/08/20 0918  . digoxin (LANOXIN)  tablet 0.125 mg  0.125 mg Oral Daily Cindra Eves F, DDS   0.125 mg at 06/08/20 0920  . fluticasone furoate-vilanterol (BREO ELLIPTA) 200-25 MCG/INH 1 puff  1 puff Inhalation Daily Cindra Eves F, DDS   1 puff at 06/07/20 0730  . furosemide (LASIX) tablet 40 mg  40 mg Oral Daily Russella Dar, NP   40 mg at 06/08/20 0916  . Ipratropium-Albuterol (COMBIVENT) respimat 1 puff  1 puff Inhalation Q6H PRN Russella Dar, NP      . magnesium oxide (MAG-OX) tablet 400 mg  400 mg Oral Daily Noralee Stain, DO   400 mg at 06/08/20 0915  . melatonin tablet 3 mg  3 mg Oral QHS PRN Noralee Stain, DO   3 mg at 05/25/20 2118  . ondansetron  (ZOFRAN) tablet 4 mg  4 mg Oral Q6H PRN [name], DDS       Or  . ondansetron [facility]) injection 4 mg  4 mg Intravenous Q6H PRN [name], DDS      . phenytoin (DILANTIN) ER capsule 300 mg  300 mg Oral BID Cindra Eves F, DDS   300 mg at 06/08/20 0913  . polyethylene glycol (MIRALAX / GLYCOLAX) packet 17 g  17 g Oral Daily PRN [name], DDS   17 g at 04/02/20 2102  . pregabalin (LYRICA) capsule 75 mg  75 mg Oral TID [name], DDS   75 mg at 06/08/20 0916  . QUEtiapine (SEROQUEL) tablet 200 mg  200 mg Oral Daily Cindra Eves F, DDS   200 mg at 06/08/20 0915  . sacubitril-valsartan (ENTRESTO) 24-26 mg per tablet  1 tablet Oral BID [name], DDS   1 tablet at 06/08/20 2449  . zolpidem (AMBIEN) tablet 10 mg  10 mg Oral QHS PRN Russella Dar, NP   10 mg at 06/07/20 2138     Discharge Medications: STOP taking these medications       albuterol 108 (90 Base) MCG/ACT inhaler Commonly known as: VENTOLIN HFA   arformoterol 15 MCG/2ML Nebu Commonly known as: BROVANA   budesonide 0.5 MG/2ML nebulizer solution Commonly known as: PULMICORT   carvedilol 3.125 MG tablet Commonly known as: COREG   gabapentin 300 MG capsule Commonly known as: NEURONTIN   metolazone 2.5 MG tablet Commonly known as: ZAROXOLYN   nicotine 21 mg/24hr patch Commonly known as: NICODERM CQ - dosed in mg/24 hours   potassium chloride 10 MEQ tablet Commonly known as: KLOR-CON   Symbicort 160-4.5 MCG/ACT inhaler Generic drug: budesonide-formoterol Replaced by: fluticasone furoate-vilanterol 200-25 MCG/INH Aepb   torsemide 20 MG tablet Commonly known as: DEMADEX             TAKE these medications       acetaminophen 325 MG tablet Commonly known as: TYLENOL Take 2 tablets (650 mg total) by mouth every 4 (four) hours as needed for mild pain or fever. What changed: reasons to take this   ALPRAZolam 0.5 MG tablet Commonly known as:  XANAX Take 1 tablet (0.5 mg total) by mouth 2 (two) times daily. What changed:   when to take this  reasons to take this   atorvastatin 40 MG tablet Commonly known as: LIPITOR Take 1 tablet (40 mg total) by mouth daily.   busPIRone 10 MG tablet Commonly known as: BUSPAR Take 1 tablet (10 mg total) by mouth 3 (three) times daily.   digoxin 0.125 MG tablet Commonly known as: LANOXIN Take 1 tablet (  0.125 mg total) by mouth daily.   fluticasone furoate-vilanterol 200-25 MCG/INH Aepb Commonly known as: BREO ELLIPTA Inhale 1 puff into the lungs daily. Replaces: Symbicort 160-4.5 MCG/ACT inhaler   furosemide 40 MG tablet Commonly known as: LASIX Take 1 tablet (40 mg total) by mouth daily.   Hinda Glatter Sustenna 234 MG/1.5ML Susy injection Generic drug: paliperidone Inject 234 mg into the muscle every 30 (thirty) days. What changed: how much to take   Ipratropium-Albuterol 20-100 MCG/ACT Aers respimat Commonly known as: COMBIVENT Inhale 1 puff into the lungs every 6 (six) hours as needed for wheezing or shortness of breath.   magnesium oxide 400 (241.3 Mg) MG tablet Commonly known as: MAG-OX Take 1 tablet (400 mg total) by mouth daily.   melatonin 3 MG Tabs tablet Take 1 tablet (3 mg total) by mouth at bedtime as needed (Use first for sleep).   phenytoin 300 MG ER capsule Commonly known as: DILANTIN Take 1 capsule (300 mg total) by mouth 2 (two) times daily. What changed:   medication strength  how much to take  when to take this   pregabalin 75 MG capsule Commonly known as: LYRICA Take 1 capsule (75 mg total) by mouth 3 (three) times daily.   QUEtiapine 200 MG tablet Commonly known as: SEROQUEL Take 1 tablet (200 mg total) by mouth daily. What changed:   medication strength  how much to take   sacubitril-valsartan 24-26 MG Commonly known as: ENTRESTO Take 1 tablet by mouth 2 (two) times daily.   zolpidem 10 MG tablet Commonly known as:  AMBIEN Take 1 tablet (10 mg total) by mouth at bedtime as needed (Use if melatonin not effective).     Relevant Imaging Results:  Relevant Lab Results:   Additional Information SS#: [id number]  Baldemar Lenis, LCSW

## 2020-06-08 NOTE — Progress Notes (Signed)
Patient discharged to ALF; discharged out via wheelchair to use [facility] for transport; discharge instructions given and reviewed; Rx's sent to facility and copy with patient prior to release.

## 2020-06-14 ENCOUNTER — Ambulatory Visit (HOSPITAL_COMMUNITY)
Admission: EM | Admit: 2020-06-14 | Discharge: 2020-06-14 | Disposition: A | Discharge status: Nursing Home (Includes ICF) | Payer: Medicare PPO | Attending: Psychiatry | Admitting: Psychiatry

## 2020-06-14 ENCOUNTER — Other Ambulatory Visit: Payer: Self-pay

## 2020-06-14 DIAGNOSIS — F4323 Adjustment disorder with mixed anxiety and depressed mood: Secondary | ICD-10-CM | POA: Diagnosis not present

## 2020-06-14 DIAGNOSIS — F0390 Unspecified dementia without behavioral disturbance: Secondary | ICD-10-CM | POA: Diagnosis not present

## 2020-06-14 DIAGNOSIS — F200 Paranoid schizophrenia: Secondary | ICD-10-CM | POA: Diagnosis not present

## 2020-06-14 MED ORDER — PALIPERIDONE PALMITATE ER 234 MG/1.5ML IM SUSY
234.0000 mg | PREFILLED_SYRINGE | Freq: Once | INTRAMUSCULAR | Status: AC
  Administered 2020-06-14: 234 mg via INTRAMUSCULAR
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
## 2020-06-14 NOTE — ED Provider Notes (Signed)
Behavioral Health Urgent Care Medical Screening Exam  Patient Name: Jim Dunn MRN: [id number] Date of Evaluation: 06/14/20 Chief Complaint:   Diagnosis:  Final diagnoses:  Dementia without behavioral disturbance, unspecified dementia type (HCC)  Paranoid schizophrenia (HCC)  Adjustment disorder with mixed anxiety and depressed mood    History of Present illness: Jim Dunn is a 54 y.o. male with a history of schizophrenia and dementia.  Patient presented to [facility] via [facility].  It was reported that patient's mother was talked to him on the phone and he made a comment about just letting him die.  Patient was brought to the [facility] for an assessment.  Patient currently denies any suicidal or homicidal ideations and denies any current hallucinations.  Patient does have a history of schizophrenia and was admitted to [facility] for 3 months from June until 06/08/2020.  At that time patient was discharged to [facility].  They did send over a list of patient's medications however it was discovered that patient was supposed to be getting Invega Sustenna 234 mg IM injection q. 30 days.  After having pharmacy review patient's chart from 3 months admission there was no documentation of the patient receiving the injection while he was there.  Patient's mother stated he was due for the injection on October 8.  TTS staff contacted the ALF and was told that the patient has not received his injection yet because they were waiting on the order from the psychiatrist.  Patient does report some depression due to being in the hospital for 3 months and stating that he has lost his home and is independence.  He states he does not like it at the facility and they are very nice to him and he knows that he is safe there.  He states that he does want to return there.  Patient states that is just been a lot of changes for him recently.  As the patient has not reported any suicidal or homicidal ideations and denied any  hallucinations, I have agreed to give the patient Gean Birchwood 234 mg IM injection and notified the facility of the patient receiving the injection here patient will be transported back to the ALF and will be seen by their psychiatrist in approximately 2 weeks.  Patient had continued to deny any suicidal or homicidal ideations and denied any hallucinations.  The patient demonstrates the following risk factors for suicide: Chronic risk factors for suicide include: psychiatric disorder of schizophrenia. Acute risk factors for suicide include: loss (financial, interpersonal, professional). Protective factors for this patient include: positive social support and positive therapeutic relationship. Considering these factors, the overall suicide risk at this point appears to be moderate. Patient is appropriate for outpatient follow up.   Psychiatric Specialty Exam  Presentation  General Appearance:Casual  Eye Contact:Good  Speech:Clear and Coherent;Normal Rate  Speech Volume:Normal  Handedness:Right   Mood and Affect  Mood:Anxious  Affect:Congruent   Thought Process  Thought Processes:Coherent  Descriptions of Associations:Intact  Orientation:Full (Time, Place and Person)  Thought Content:Tangential;Rumination  Hallucinations:None  Ideas of Reference:None  Suicidal Thoughts:No  Homicidal Thoughts:No   Sensorium  Memory:Immediate Fair;Recent Fair;Remote Fair  Judgment:Fair  Insight:Fair   Executive Functions  Concentration:Good  Attention Span:Good  Recall:Fair  Fund of Knowledge:Fair  Language:Good   Psychomotor Activity  Psychomotor Activity:Normal   Assets  Assets:Communication Skills;Desire for Improvement;Financial Resources/Insurance;Housing;Social Support;Transportation   Sleep  Sleep:Good  Number of hours: No data recorded  Physical Exam: Physical Exam Vitals and nursing  note reviewed.  Constitutional:      Appearance: He is  well-developed.  HENT:     Head: Normocephalic.  Eyes:     Pupils: Pupils are equal, round, and reactive to light.  Cardiovascular:     Rate and Rhythm: Tachycardia present.  Pulmonary:     Effort: Pulmonary effort is normal.  Musculoskeletal:        General: Normal range of motion.  Neurological:     Mental Status: He is alert and oriented to person, place, and time.    Review of Systems  Constitutional: Negative.   HENT: Negative.   Eyes: Negative.   Respiratory: Negative.   Cardiovascular: Negative.   Gastrointestinal: Negative.   Genitourinary: Negative.   Musculoskeletal: Negative.   Skin: Negative.   Neurological: Negative.   Endo/Heme/Allergies: Negative.   Psychiatric/Behavioral: Positive for depression.   Blood pressure (!) 160/98, pulse (!) 102, temperature 97.7 F (36.5 C), temperature source Temporal, resp. rate 18, height 5\' 8"  (1.727 m), weight 200 lb (90.7 kg), SpO2 100 %. Body mass index is 30.41 kg/m.  Musculoskeletal: Strength & Muscle Tone: within normal limits Gait & Station: normal Patient leans: N/A   [facility] MSE Discharge Disposition for Follow up and Recommendations: Based on my evaluation the patient does not appear to have an emergency medical condition and can be discharged with resources and follow up care in outpatient services for Medication Management and Individual Therapy   , FNP 06/14/2020, 12:49 PM

## 2020-06-14 NOTE — Discharge Instructions (Addendum)
Patient is instructed prior to discharge to: Take all medications as prescribed by his/her mental healthcare provider. Report any adverse effects and or reactions from the medicines to his/her outpatient provider promptly. Patient has been instructed & cautioned: To not engage in alcohol and or illegal drug use while on prescription medicines. In the event of worsening symptoms, patient is instructed to call the crisis hotline, 911 and or go to the nearest ED for appropriate evaluation and treatment of symptoms. To follow-up with his/her primary care provider for your other medical issues, concerns and or health care needs.  Patient received Invega Sustenna 234 mg IM today. It is due Q30 days.

## 2020-06-14 NOTE — ED Notes (Addendum)
Pt discharged to [facility] facility in [place]. Pt received Gean Birchwood injection to left deltoid prior to discharge. Tolerated well. Verbalized understanding of all discharge instructions reviewed by Clinical research associate. Safe transport called to transport pt to home. Safety maintained.

## 2020-06-14 NOTE — ED Notes (Signed)
Patient belongings in locker #15 

## 2020-06-15 ENCOUNTER — Emergency Department (HOSPITAL_COMMUNITY)
Admission: EM | Admit: 2020-06-15 | Discharge: 2020-06-17 | Disposition: A | Discharge status: Home / Self Care | Payer: Medicare PPO | Attending: Emergency Medicine | Admitting: Emergency Medicine

## 2020-06-15 ENCOUNTER — Other Ambulatory Visit: Payer: Self-pay

## 2020-06-15 ENCOUNTER — Encounter (HOSPITAL_COMMUNITY): Payer: Self-pay

## 2020-06-15 DIAGNOSIS — Z20822 Contact with and (suspected) exposure to covid-19: Secondary | ICD-10-CM | POA: Diagnosis not present

## 2020-06-15 DIAGNOSIS — F0390 Unspecified dementia without behavioral disturbance: Secondary | ICD-10-CM | POA: Insufficient documentation

## 2020-06-15 DIAGNOSIS — R451 Restlessness and agitation: Secondary | ICD-10-CM | POA: Diagnosis present

## 2020-06-15 DIAGNOSIS — J449 Chronic obstructive pulmonary disease, unspecified: Secondary | ICD-10-CM | POA: Insufficient documentation

## 2020-06-15 DIAGNOSIS — I5022 Chronic systolic (congestive) heart failure: Secondary | ICD-10-CM | POA: Diagnosis not present

## 2020-06-15 DIAGNOSIS — F17210 Nicotine dependence, cigarettes, uncomplicated: Secondary | ICD-10-CM | POA: Diagnosis not present

## 2020-06-15 DIAGNOSIS — R45851 Suicidal ideations: Secondary | ICD-10-CM | POA: Diagnosis not present

## 2020-06-15 DIAGNOSIS — F203 Undifferentiated schizophrenia: Secondary | ICD-10-CM | POA: Diagnosis not present

## 2020-06-15 LAB — COMPREHENSIVE METABOLIC PANEL
ALT: 26 U/L (ref 0–44)
AST: 27 U/L (ref 15–41)
Albumin: 4.7 g/dL (ref 3.5–5.0)
Alkaline Phosphatase: 249 U/L — ABNORMAL HIGH (ref 38–126)
Anion gap: 11 (ref 5–15)
BUN: 7 mg/dL (ref 6–20)
CO2: 23 mmol/L (ref 22–32)
Calcium: 9.3 mg/dL (ref 8.9–10.3)
Chloride: 95 mmol/L — ABNORMAL LOW (ref 98–111)
Creatinine, Ser: 0.52 mg/dL — ABNORMAL LOW (ref 0.61–1.24)
GFR, Estimated: >60 mL/min (ref >60)
Glucose, Bld: 96 mg/dL (ref 70–99)
Potassium: 3.6 mmol/L (ref 3.5–5.1)
Sodium: 129 mmol/L — ABNORMAL LOW (ref 135–145)
Total Bilirubin: 0.8 mg/dL (ref 0.3–1.2)
Total Protein: 8.5 g/dL — ABNORMAL HIGH (ref 6.5–8.1)

## 2020-06-15 LAB — CBC WITH DIFFERENTIAL/PLATELET
Abs Immature Granulocytes: 0.04 10*3/uL (ref 0.00–0.07)
Basophils Absolute: 0 10*3/uL (ref 0.0–0.1)
Basophils Relative: 1 %
Eosinophils Absolute: 0.2 10*3/uL (ref 0.0–0.5)
Eosinophils Relative: 2 %
HCT: 40.9 % (ref 39.0–52.0)
Hemoglobin: 13.5 g/dL (ref 13.0–17.0)
Immature Granulocytes: 1 %
Lymphocytes Relative: 39 %
Lymphs Abs: 3.2 10*3/uL (ref 0.7–4.0)
MCH: 23.9 pg — ABNORMAL LOW (ref 26.0–34.0)
MCHC: 33 g/dL (ref 30.0–36.0)
MCV: 72.4 fL — ABNORMAL LOW (ref 80.0–100.0)
Monocytes Absolute: 0.9 10*3/uL (ref 0.1–1.0)
Monocytes Relative: 11 %
Neutro Abs: 3.8 10*3/uL (ref 1.7–7.7)
Neutrophils Relative %: 46 %
Platelets: 210 10*3/uL (ref 150–400)
RBC: 5.65 MIL/uL (ref 4.22–5.81)
RDW: 16.1 % — ABNORMAL HIGH (ref 11.5–15.5)
WBC: 8.2 10*3/uL (ref 4.0–10.5)
nRBC: 0 % (ref 0.0–0.2)

## 2020-06-15 LAB — ETHANOL: Alcohol, Ethyl (B): <10 mg/dL (ref <10)

## 2020-06-15 LAB — RESPIRATORY PANEL BY RT PCR (FLU A&B, COVID)
Influenza A by PCR: NEGATIVE
Influenza B by PCR: NEGATIVE
SARS Coronavirus 2 by RT PCR: NEGATIVE

## 2020-06-15 LAB — PHENYTOIN LEVEL, TOTAL: Phenytoin Lvl: 14.7 ug/mL (ref 10.0–20.0)

## 2020-06-15 LAB — SALICYLATE LEVEL: Salicylate Lvl: <7 mg/dL — ABNORMAL LOW (ref 7.0–30.0)

## 2020-06-15 LAB — ACETAMINOPHEN LEVEL: Acetaminophen (Tylenol), Serum: <10 ug/mL — ABNORMAL LOW (ref 10–30)

## 2020-06-15 MED ORDER — ZOLPIDEM TARTRATE 5 MG PO TABS
10.0000 mg | ORAL_TABLET | Freq: Every evening | ORAL | Status: DC | PRN
  Administered 2020-06-17: 10 mg via ORAL
  Filled 2020-06-15: qty 2

## 2020-06-15 MED ORDER — SACUBITRIL-VALSARTAN 24-26 MG PO TABS
1.0000 | ORAL_TABLET | Freq: Two times a day (BID) | ORAL | Status: DC
  Administered 2020-06-15 – 2020-06-17 (×4): 1 via ORAL
  Filled 2020-06-15 (×4): qty 1

## 2020-06-15 MED ORDER — MELATONIN 3 MG PO TABS
3.0000 mg | ORAL_TABLET | Freq: Every evening | ORAL | Status: DC | PRN
  Administered 2020-06-17: 3 mg via ORAL
  Filled 2020-06-15: qty 1

## 2020-06-15 MED ORDER — QUETIAPINE FUMARATE 100 MG PO TABS
200.0000 mg | ORAL_TABLET | Freq: Every day | ORAL | Status: DC
  Administered 2020-06-16 – 2020-06-17 (×2): 200 mg via ORAL
  Filled 2020-06-15 (×3): qty 2

## 2020-06-15 MED ORDER — ALPRAZOLAM 0.5 MG PO TABS
0.5000 mg | ORAL_TABLET | Freq: Two times a day (BID) | ORAL | Status: DC
  Administered 2020-06-15 – 2020-06-17 (×4): 0.5 mg via ORAL
  Filled 2020-06-15 (×4): qty 1

## 2020-06-15 MED ORDER — PHENYTOIN SODIUM EXTENDED 100 MG PO CAPS
300.0000 mg | ORAL_CAPSULE | Freq: Two times a day (BID) | ORAL | Status: DC
  Administered 2020-06-15 – 2020-06-17 (×4): 300 mg via ORAL
  Filled 2020-06-15 (×4): qty 3

## 2020-06-15 MED ORDER — DIGOXIN 125 MCG PO TABS
0.1250 mg | ORAL_TABLET | Freq: Every day | ORAL | Status: DC
  Administered 2020-06-16 – 2020-06-17 (×2): 0.125 mg via ORAL
  Filled 2020-06-15 (×2): qty 1

## 2020-06-15 MED ORDER — PREGABALIN 50 MG PO CAPS
75.0000 mg | ORAL_CAPSULE | Freq: Three times a day (TID) | ORAL | Status: DC
  Administered 2020-06-15 – 2020-06-17 (×5): 75 mg via ORAL
  Filled 2020-06-15 (×5): qty 1

## 2020-06-15 MED ORDER — FUROSEMIDE 40 MG PO TABS
40.0000 mg | ORAL_TABLET | Freq: Every day | ORAL | Status: DC
  Administered 2020-06-16 – 2020-06-17 (×2): 40 mg via ORAL
  Filled 2020-06-15 (×2): qty 1

## 2020-06-15 MED ORDER — ATORVASTATIN CALCIUM 40 MG PO TABS
40.0000 mg | ORAL_TABLET | Freq: Every day | ORAL | Status: DC
  Administered 2020-06-16 – 2020-06-17 (×2): 40 mg via ORAL
  Filled 2020-06-15 (×2): qty 1

## 2020-06-15 MED ORDER — MAGNESIUM OXIDE 400 (241.3 MG) MG PO TABS
400.0000 mg | ORAL_TABLET | Freq: Every day | ORAL | Status: DC
  Administered 2020-06-16 – 2020-06-17 (×2): 400 mg via ORAL
  Filled 2020-06-15 (×2): qty 1
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
## 2020-06-15 NOTE — ED Provider Notes (Signed)
[facility] DEPT Provider Note   CSN: [id number] Arrival date & time: 06/15/20  1819     History Chief Complaint  Patient presents with  . Agitation  . Suicidal    Jim Dunn is a 54 y.o. male with history of schizophrenia, dementia, COPD, sleep apnea,, seizures, CHF with EF 25 to 30% presents to the ED from living facility for evaluation of increased agitation, isolation today.  Per triage note patient is endorsing suicidal ideation.  Patient is able to tell me his full name, date of birth, year and that he is in the hospital.  States that he has been hearing the voices of people he used to know talking to him about hell.  When asked who these people are he tells me that there are people he used to know from back home.  Patient appears anxious, holding his head.  Tells me he has had feels cold.  Patient states "I know this is in the right but can you put me to sleep".  When asked if he is tired patient states he is tired and does not want to wake up.  He denies any headache, chest pain, shortness of breath or abdominal pain.  Denies plan of suicide, homicidal ideation.  Level 5 caveat due to history of dementia.   HPI     Past Medical History:  Diagnosis Date  . CHF (congestive heart failure) ([place])   . COPD (chronic obstructive pulmonary disease) ([place])   . History of kidney stones   . Paranoid schizophrenia ([place])   . Seizures ([place])   . Tobacco abuse     Patient Active Problem List   Diagnosis Date Noted  . Dementia ([place]) 06/08/2020  . 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 Chronic post-traumatic stress disorder (PTSD)   . Noncompliance with medications   . Subtherapeutic serum dilantin level   . Seizure ([place])   . Hypomagnesemia 02/23/2020  . Suicidal ideation 02/23/2020  . Hyponatremia 02/19/2020  . Paranoid schizophrenia ([place])   . Hypokalemia   . Chronic systolic CHF (congestive heart failure) ([place])  01/22/2020  . Mixed hyperlipidemia 01/22/2020  . COPD (chronic obstructive pulmonary disease) ([place]) 01/22/2020  . Microcytic anemia 01/22/2020  . Chronic hyponatremia 01/22/2020  . Seizure disorder ([place]) 01/22/2020    Past Surgical History:  Procedure Laterality Date  . KIDNEY STONE SURGERY    . MULTIPLE EXTRACTIONS WITH ALVEOLOPLASTY Bilateral 04/29/2020   Procedure: Extraction of tooth #'s 2-13, 17,18, 21-25, and 27-31 with alveoloplasty and bilateral lingual exostoses reductions.;  Surgeon: Lenn Cal, DDS;  Location: [facility];  Service: Oral Surgery;  Laterality: Bilateral;       Family History  Problem Relation Age of Onset  . Heart disease Other     Social History   Tobacco Use  . Smoking status: Current Every Day Smoker    Packs/day: 1.00    Types: Cigarettes  . Smokeless tobacco: Current User    Types: Chew  Vaping Use  . Vaping Use: Never used  Substance Use Topics  . Alcohol use: Not Currently  . Drug use: Never    Home Medications Prior to Admission medications   Medication Sig Start Date End Date Taking? Authorizing Provider  acetaminophen (TYLENOL) 325 MG tablet Take 2 tablets (650 mg total) by mouth every 4 (four) hours as needed for mild pain or fever. 06/07/20  Yes Samella Parr, NP  ALPRAZolam Duanne Moron) 0.5 MG tablet Take 1 tablet (0.5  mg total) by mouth 2 (two) times daily. 06/07/20  Yes Samella Parr, NP  atorvastatin (LIPITOR) 40 MG tablet Take 1 tablet (40 mg total) by mouth daily. 06/07/20  Yes Samella Parr, NP  busPIRone (BUSPAR) 10 MG tablet Take 1 tablet (10 mg total) by mouth 3 (three) times daily. 06/07/20  Yes Samella Parr, NP  digoxin (LANOXIN) 0.125 MG tablet Take 1 tablet (0.125 mg total) by mouth daily. 06/07/20  Yes Samella Parr, NP  fluticasone furoate-vilanterol (BREO ELLIPTA) 200-25 MCG/INH AEPB Inhale 1 puff into the lungs daily. 06/08/20  Yes Samella Parr, NP  furosemide (LASIX) 40 MG tablet Take 1 tablet (40 mg  total) by mouth daily. 06/08/20  Yes Samella Parr, NP  INVEGA SUSTENNA 234 MG/1.5ML SUSY injection Inject 234 mg into the muscle every 30 (thirty) days. 06/07/20  Yes Samella Parr, NP  Ipratropium-Albuterol (COMBIVENT) 20-100 MCG/ACT AERS respimat Inhale 1 puff into the lungs every 6 (six) hours as needed for wheezing or shortness of breath. 06/07/20  Yes Samella Parr, NP  magnesium oxide (MAG-OX) 400 (241.3 Mg) MG tablet Take 1 tablet (400 mg total) by mouth daily. 06/08/20  Yes Samella Parr, NP  melatonin 3 MG TABS tablet Take 1 tablet (3 mg total) by mouth at bedtime as needed (Use first for sleep). 06/07/20  Yes Samella Parr, NP  phenytoin (DILANTIN) 300 MG ER capsule Take 1 capsule (300 mg total) by mouth 2 (two) times daily. 06/07/20  Yes Samella Parr, NP  pregabalin (LYRICA) 75 MG capsule Take 1 capsule (75 mg total) by mouth 3 (three) times daily. 06/07/20  Yes Samella Parr, NP  QUEtiapine (SEROQUEL) 200 MG tablet Take 1 tablet (200 mg total) by mouth daily. 06/08/20  Yes Samella Parr, NP  sacubitril-valsartan (ENTRESTO) 24-26 MG Take 1 tablet by mouth 2 (two) times daily. 06/07/20  Yes Samella Parr, NP  zolpidem (AMBIEN) 10 MG tablet Take 1 tablet (10 mg total) by mouth at bedtime as needed (Use if melatonin not effective). 06/07/20  Yes Samella Parr, NP    Allergies    Patient has no known allergies.  Review of Systems   Review of Systems  Psychiatric/Behavioral: Positive for hallucinations and suicidal ideas. The patient is nervous/anxious.   All other systems reviewed and are negative.   Physical Exam Updated Vital Signs BP (!) 169/94 (BP Location: Right Arm)   Pulse (!) 103   Temp 98.6 F (37 C) (Oral)   Resp 16   Ht 5' 8"  (1.727 m)   SpO2 99%   BMI 30.41 kg/m   Physical Exam Vitals and nursing note reviewed.  Constitutional:      Appearance: He is well-developed.     Comments: Non toxic.  HENT:     Head: Normocephalic and atraumatic.      Nose: Nose normal.  Eyes:     Conjunctiva/sclera: Conjunctivae normal.  Cardiovascular:     Rate and Rhythm: Normal rate and regular rhythm.     Heart sounds: Normal heart sounds.  Pulmonary:     Effort: Pulmonary effort is normal.     Breath sounds: Normal breath sounds.  Abdominal:     General: Bowel sounds are normal.     Palpations: Abdomen is soft.     Tenderness: There is no abdominal tenderness.     Comments: Soft, NTND  Musculoskeletal:        General: Normal range of motion.  Cervical back: Normal range of motion.  Skin:    General: Skin is warm and dry.     Capillary Refill: Capillary refill takes less than 2 seconds.  Neurological:     Mental Status: He is alert.  Psychiatric:        Mood and Affect: Mood is anxious. Affect is labile.        Speech: Speech is rapid and pressured.        Judgment: Judgment is inappropriate.     Comments: Patient asking me if I can "put me to sleep". When asked if he wanted to rest because he is tired he says "I want to sleep and not wake up". No suicide plan. Denies HI. No active AVH but states he has been hearing people he used to know talk about hell.     ED Results / Procedures / Treatments   Labs (all labs ordered are listed, but only abnormal results are displayed) Labs Reviewed  CBC WITH DIFFERENTIAL/PLATELET - Abnormal; Notable for the following components:      Result Value   MCV 72.4 (*)    MCH 23.9 (*)    RDW 16.1 (*)    All other components within normal limits  COMPREHENSIVE METABOLIC PANEL - Abnormal; Notable for the following components:   Sodium 129 (*)    Chloride 95 (*)    Creatinine, Ser 0.52 (*)    Total Protein 8.5 (*)    Alkaline Phosphatase 249 (*)    All other components within normal limits  SALICYLATE LEVEL - Abnormal; Notable for the following components:   Salicylate Lvl <1.6 (*)    All other components within normal limits  ACETAMINOPHEN LEVEL - Abnormal; Notable for the following  components:   Acetaminophen (Tylenol), Serum <10 (*)    All other components within normal limits  RESPIRATORY PANEL BY RT PCR (FLU A&B, COVID)  ETHANOL  PHENYTOIN LEVEL, TOTAL  RAPID URINE DRUG SCREEN, HOSP PERFORMED  CBC WITH DIFFERENTIAL/PLATELET    EKG None  Radiology No results found.  Procedures Procedures (including critical care time)  Medications Ordered in ED Medications  atorvastatin (LIPITOR) tablet 40 mg (has no administration in time range)  digoxin (LANOXIN) tablet 0.125 mg (has no administration in time range)  furosemide (LASIX) tablet 40 mg (has no administration in time range)  magnesium oxide (MAG-OX) tablet 400 mg (has no administration in time range)  melatonin tablet 3 mg (has no administration in time range)  phenytoin (DILANTIN) ER capsule 300 mg (has no administration in time range)  pregabalin (LYRICA) capsule 75 mg (has no administration in time range)  QUEtiapine (SEROQUEL) tablet 200 mg (has no administration in time range)  sacubitril-valsartan (ENTRESTO) 24-26 mg per tablet (has no administration in time range)  zolpidem (AMBIEN) tablet 10 mg (has no administration in time range)  ALPRAZolam (XANAX) tablet 0.5 mg (has no administration in time range)    ED Course  I have reviewed the triage vital signs and the nursing notes.  Pertinent labs & imaging results that were available during my care of the patient were reviewed by me and considered in my medical decision making (see chart for details).  Clinical Course as of Jun 15 2234  Tue Jun 15, 2020  2009 Sodium(!): 129 [CG]  2203 Chloride(!): 95 [CG]  2203 Creatinine(!): 0.52 [CG]  2203 Alkaline Phosphatase(!): 249 [CG]    Clinical Course User Index [CG] Arlean Hopping   MDM Rules/Calculators/A&P  EMR triage and nursing notes reviewed to assist with history and MDM.  Patient evaluated at [facility] less than 24 hours ago. At that time he denies SI, HI,  hallucinations. He lives at [facility].  Reportedly not receiving Invega Sustenna 234 mg IM every 30 days but not receiving it at the facility for the last several months. Psych NP Marvia Pickles administered injection prior to discharging patient back to ALF.   In ER today patient appears anxious. Reports hearing voices of people he used to know talking about "hell". Asks me if I can put him to sleep, does not want to "wake back up".  Intermittently says "I know that's wrong".   ER work up including lab work CBC, CMP, ETOH, ASA and APAP levels, UDS and phenytoin level ordered by me. EKG ordered.   ER work up personally visualized and interpreted, remarkable as above. No indication for further emergent lab work, imaging or admission. Alk phost elevated today 249 but other LFT and total bilirubin normal, no abdominal tenderness, nausea, vomiting.    2035: Patient medically cleared. TTS ordered. Diet and medications ordered. IVC filled out by me/Dr Vanita Panda.   Final Clinical Impression(s) / ED Diagnoses Final diagnoses:  Suicidal ideation    Rx / DC Orders ED Discharge Orders    None       Arlean Hopping 06/15/20 2236    Carmin Muskrat, MD 06/15/20 2341

## 2020-06-15 NOTE — ED Triage Notes (Signed)
EMS reports staffed called because pt having increased agitation and isolation today. Pt told EMS he "wants to go to sleep forever, wants to go to sleep". No violence. Hx dementia, alert w/ confusion at baseline. Pt has SI.   BP 148/100 HR 116 RR 20 SpO2 99% RA CBG 107 Temp 98.2

## 2020-06-16 ENCOUNTER — Encounter (HOSPITAL_COMMUNITY): Payer: Self-pay | Admitting: Behavioral Health

## 2020-06-16 DIAGNOSIS — F203 Undifferentiated schizophrenia: Secondary | ICD-10-CM

## 2020-06-16 LAB — RAPID URINE DRUG SCREEN, HOSP PERFORMED
Amphetamines: NOT DETECTED
Barbiturates: POSITIVE — AB
Benzodiazepines: POSITIVE — AB
Cocaine: NOT DETECTED
Opiates: NOT DETECTED
Tetrahydrocannabinol: NOT DETECTED

## 2020-06-16 NOTE — BH Assessment (Signed)
Tele Assessment Note   Patient Name: Jim Dunn MRN: [id number] Referring Physician: EDP Location of Patient: [facility] Location of Provider: Behavioral Health TTS Department  Jim Dunn is a 54 y.o. male who presented to [facility] on voluntary basis with complaint of agitation and also suicidal ideation.  Pt lives in [place] at the [facility].  Pt receives outpatient psychiatric services through a provider with the ALF for treatment of schizophrenia.  Pt was last assessed by TTS on 1011/21 with complaint of suicidal ideation and hallucination. He was given a 234 mg dose of Invega and transferred back to [facility].  Per history, Pt has diagnoses of Schizophrenia and also Major Neurocognitive Disorder (resulting in memory and cognitive deficiencies).  Pt stated that he became very agitated last night due to ongoing experience of auditory hallucination and rumination -- voices that are experienced as hallucination and also in his head threatening him with hell and cursing at him.  The ALF called EMS, and Pt was transported to the hospital.  Pt said that he wants to die -- ''I want to sleep and never wake up.''  Pt denied any specific suicidal plan or intent, and he denied past suicide attempt.  Pt denied current hallucination.  He also denied homicidal ideation, self-injurious behavior, and substance use concerns.   Pt said he wanted help or to ''go to sleep forever.''  Per history, Pt is his own guardian.  He is prescribed Invega on a monthly basis.    During assessment, Pt presented as alert and oriented.  He had good eye contact and was cooperative.  Pt appeared appropriately groomed.  Pt's mood was reported as depressed.  Affect was preoccupied.  Pt's speech was normal in rate, rhythm, and volume.  Thought processes were within normal range, and thought content was logical and goal-oriented.  There was no evidence of delusion.  Pt's memory and concentration were fair.  Insight and judgment  were poor.  Impulse control was fair.  Consulted with S. Rankin, NP, who determined that Pt does not meet inpatient criteria -- presentation is the same as it was when discharged on 10/11.  It is recommended that he follow-up with his outpatient provider.    Diagnosis: Schizophrenia, paranoid  Past Medical History:  Past Medical History:  Diagnosis Date  . CHF (congestive heart failure) (H[facility])   . COPD (chronic obstructive pulmonary disease) ([facility])   . History of kidney stones   . Paranoid schizophrenia ([facility])   . Seizures ([facility])   . Tobacco abuse     Past Surgical History:  Procedure Laterality Date  . KIDNEY STONE SURGERY    . MULTIPLE EXTRACTIONS WITH ALVEOLOPLASTY Bilateral 04/29/2020   Procedure: Extraction of tooth #'s 2-13, 17,18, 21-25, and 27-31 with alveoloplasty and bilateral lingual exostoses reductions.;  Surgeon: [name], DDS;  Location: [facility] OR;  Service: Oral Surgery;  Laterality: Bilateral;    Family History:  Family History  Problem Relation Age of Onset  . Heart disease Other     Social History:  reports that he has been smoking cigarettes. He has been smoking about 1.00 pack per day. His smokeless tobacco use includes chew. He reports previous alcohol use. He reports that he does not use drugs.  Additional Social History:  Alcohol / Drug Use Pain Medications: See MAR Prescriptions: See MAR Over the Counter: See MAR History of alcohol / drug use?: No history of alcohol / drug abuse  CIWA: CIWA-Ar BP: (!) 155/103 Pulse Rate: [name])  101 COWS:    Allergies: No Known Allergies  Home Medications: (Not in a hospital admission)   OB/GYN Status:  No LMP for male patient.  General Assessment Data Assessment unable to be completed: Yes Reason for Not Completing Assessment: Pt is asleep and cannot be aroused at this time. Will attempt again later. Location of Assessment: [facility] TTS Assessment: In system Is this a Tele or Face-to-Face Assessment?: Tele  Assessment Is this an Initial Assessment or a Re-assessment for this encounter?: Initial Assessment Patient Accompanied by:: N/A Language Other than English: No Living Arrangements: In Assisted Living/Nursing Home (Comment: Name of Nursing Home ([facility]) What gender do you identify as?: Male Date Telepsych consult ordered in [facility]: 06/16/20 Marital status: Single Pregnancy Status: No Living Arrangements: Other (Comment) (ALF) Can pt return to current living arrangement?: Yes Admission Status: Voluntary Is patient capable of signing voluntary admission?: Yes Referral Source: Self/Family/Friend Insurance type:  [facility])     Crisis Care Plan Living Arrangements: Other (Comment) (ALF) Legal Guardian: Other: (Self) Name of Psychiatrist:  [employer] [facility])  Education Status Is patient currently in school?: No  Risk to self with the past 6 months Suicidal Ideation: Yes-Currently Present Has patient been a risk to self within the past 6 months prior to admission? : No Suicidal Intent: No Has patient had any suicidal intent within the past 6 months prior to admission? : No Is patient at risk for suicide?: Yes (See notes) Suicidal Plan?: No Access to Means: No What has been your use of drugs/alcohol within the last 12 months?:  (No) Previous Attempts/Gestures: No Intentional Self Injurious Behavior: None Family Suicide History: Unknown Persecutory voices/beliefs?: Yes Depression: Yes Depression Symptoms: Despondent, Loss of interest in usual pleasures, Feeling worthless/self pity Substance abuse history and/or treatment for substance abuse?: No Suicide prevention information given to non-admitted patients: Not applicable  Risk to Others within the past 6 months Homicidal Ideation: No Does patient have any lifetime risk of violence toward others beyond the six months prior to admission? : No Thoughts of Harm to Others: No Current Homicidal Intent: No Current  Homicidal Plan: No Access to Homicidal Means: No History of harm to others?: No Assessment of Violence: None Noted Does patient have access to weapons?: No Criminal Charges Pending?: No Is patient on probation?: No  Psychosis Hallucinations: Auditory Delusions: None noted  Mental Status Report Appearance/Hygiene: Unremarkable, In scrubs Motor Activity: Freedom of movement Level of Consciousness: Alert Mood: Despair Affect: Preoccupied Anxiety Level: None Thought Processes: Circumstantial Judgement: Partial Orientation: Person, Place, Time, Situation Obsessive Compulsive Thoughts/Behaviors: None  Cognitive Functioning Concentration: Fair Memory: Recent Intact, Remote Intact Is patient IDD: No Insight: Poor Impulse Control: Fair Appetite: Fair Sleep: Decreased Total Hours of Sleep: 5 Vegetative Symptoms: None  ADLScreening [facility] Assessment Services) Patient's cognitive ability adequate to safely complete daily activities?: Yes Patient able to express need for assistance with ADLs?: Yes Independently performs ADLs?: Yes (appropriate for developmental age)     Prior Outpatient Therapy Prior Outpatient Therapy: Yes Prior Therapy Dates:  (Ongoing) Prior Therapy Facilty/Provider(s):  (Provider thru [facility]) Reason for Treatment:  (Schizophrenia) Does patient have an ACCT team?: No Does patient have Intensive In-House Services?  : No Does patient have [facility] services? : No Does patient have P4CC services?: No  ADL Screening (condition at time of admission) Patient's cognitive ability adequate to safely complete daily activities?: Yes Is the patient deaf or have difficulty hearing?: No Does the patient have difficulty seeing,  even when wearing glasses/contacts?: No Does the patient have difficulty concentrating, remembering, or making decisions?: No Patient able to express need for assistance with ADLs?: Yes Does the patient have difficulty dressing or  bathing?: No Independently performs ADLs?: Yes (appropriate for developmental age) Does the patient have difficulty walking or climbing stairs?: Yes Weakness of Legs: Both Weakness of Arms/Hands: Both  Home Assistive Devices/Equipment Home Assistive Devices/Equipment: Cane (specify quad or straight)  Therapy Consults (therapy consults require a physician order) PT Evaluation Needed: No OT Evalulation Needed: No SLP Evaluation Needed: No Abuse/Neglect Assessment (Assessment to be complete while patient is alone) Abuse/Neglect Assessment Can Be Completed: Yes Physical Abuse: Denies Verbal Abuse: Denies Sexual Abuse: Denies Exploitation of patient/patient's resources: Denies Self-Neglect: Denies Values / Beliefs Cultural Requests During Hospitalization: None Spiritual Requests During Hospitalization: None Consults Spiritual Care Consult Needed: No Transition of Care Team Consult Needed: No Advance Directives (For Healthcare) Does Patient Have a Medical Advance Directive?: No Would patient like information on creating a medical advance directive?: No - Patient declined Nutrition Screen- [facility] Adult/[facility]/AP Patient's home diet: Regular        Disposition:  Disposition Initial Assessment Completed for this Encounter: Yes Disposition of Patient: Discharge  This service was provided via telemedicine using a 2-way, interactive audio and video technology.  Names of all persons participating in this telemedicine service and their role in this encounter. Name: Jim Dunn Role: Patient             Earline Mayotte 06/16/2020 6:45 PM

## 2020-06-16 NOTE — ED Notes (Signed)
Sitter at bedside.

## 2020-06-16 NOTE — ED Notes (Addendum)
Pt religiously preoccupied requesting Bible after verbalizing that he did something wrong " I said some things that was not nice but I said I was sorry" . When asked to what he said and to whom he said it he became very guarded and then started singing.

## 2020-06-16 NOTE — ED Notes (Signed)
Call from pt mother Carlisle Enke  Requesting to speak to pt doctor to describe to him what Mr Curfman has been doing  At home request to be call at [phone]

## 2020-06-16 NOTE — BH Assessment (Signed)
Clinician spoke to pt's charge nurse, Victorino Dike RN, in regards to completing pt's [facility] Assessment. Victorino Dike stated pt is deep asleep and cannot be aroused at this time. TSS will attempt pt's assessment at a later time.

## 2020-06-17 LAB — COMPREHENSIVE METABOLIC PANEL
ALT: 29 U/L (ref 0–44)
AST: 28 U/L (ref 15–41)
Albumin: 3.9 g/dL (ref 3.5–5.0)
Alkaline Phosphatase: 215 U/L — ABNORMAL HIGH (ref 38–126)
Anion gap: 12 (ref 5–15)
BUN: 7 mg/dL (ref 6–20)
CO2: 23 mmol/L (ref 22–32)
Calcium: 8.9 mg/dL (ref 8.9–10.3)
Chloride: 90 mmol/L — ABNORMAL LOW (ref 98–111)
Creatinine, Ser: 0.47 mg/dL — ABNORMAL LOW (ref 0.61–1.24)
GFR, Estimated: >60 mL/min (ref >60)
Glucose, Bld: 77 mg/dL (ref 70–99)
Potassium: 3.7 mmol/L (ref 3.5–5.1)
Sodium: 125 mmol/L — ABNORMAL LOW (ref 135–145)
Total Bilirubin: 0.6 mg/dL (ref 0.3–1.2)
Total Protein: 7.3 g/dL (ref 6.5–8.1)

## 2020-06-17 NOTE — Discharge Instructions (Addendum)
You were evaluated in the Emergency Department and after careful evaluation, we did not find any emergent condition requiring admission or further testing in the hospital.  Your exam/testing today was overall reassuring.  Please return to the Emergency Department if you experience any worsening of your condition.  Thank you for allowing us to be a part of your care.  

## 2020-06-17 NOTE — Progress Notes (Addendum)
CSW spoke with receptionist at [facility] who states he will arrange for this patient to be picked up shortly.  CSW notified Dr. Pilar Plate and Waynetta Sandy, RN of information.  [name], MSW, LCSW-A Transitions of Care  Clinical Social Worker  [facility]  Medical ICU [phone]

## 2020-06-17 NOTE — ED Notes (Signed)
Pt DC off unit to Group home. Pt alert , calm, cooperative, no s/s of distress. Belongings given to pt. Pt ambulatory off unit, escorted by NT. Pt transported by caregiver.

## 2020-06-17 NOTE — ED Provider Notes (Signed)
Emergency Medicine Observation Re-evaluation Note  Jim Dunn is a 54 y.o. male, seen on rounds today.  Pt initially presented to the ED for complaints of Agitation and Suicidal Currently, the patient is resting.  Physical Exam  BP (!) 154/101 (BP Location: Left Arm)   Pulse 95   Temp 98.1 F (36.7 C) (Oral)   Resp 19   Ht 5\' 8"  (1.727 m)   SpO2 98%   BMI 30.41 kg/m  Physical Exam CONSTITUTIONAL: Well-appearing, NAD NEURO: Sleeping ENT/NECK:  supple, no JVD CARDIO: Regular rate, well-perfused PULM: No increased work of breathing GI/GU: Nondistended MSK/SPINE:  No gross deformities, no edema SKIN:  no rash, atraumatic PSYCH: Deferred  ED Course / MDM  EKG:EKG Interpretation  Date/Time:  Tuesday June 15 2020 21:05:44 EDT Ventricular Rate:  93 PR Interval:  190 QRS Duration: 98 QT Interval:  400 QTC Calculation: 497 R Axis:   101 Text Interpretation: Normal sinus rhythm Rightward axis Minimal voltage criteria for LVH, may be normal variant ( Cornell product ) T wave abnormality, consider inferior ischemia Prolonged QT Abnormal ECG No acute changes Confirmed by [id number] [phone]) on 06/16/2020 6:24:17 PM  Clinical Course as of Jun 17 830  Tue Jun 15, 2020  2009 Sodium(!): 129 [CG]  2203 Chloride(!): 95 [CG]  2203 Creatinine(!): 0.52 [CG]  2203 Alkaline Phosphatase(!): 249 [CG]    Clinical Course User Index [CG] 2204, PA-C   I have reviewed the labs performed to date as well as medications administered while in observation.  Recent changes in the last 24 hours include: Patient is medically and psychiatrically cleared, awaiting transport considerations back to home/facility.  Plan  Current plan is for discharge today. Patient is not under full IVC at this time.   Liberty Handy, MD 06/17/20 [phone]

## 2020-07-21 ENCOUNTER — Other Ambulatory Visit: Payer: Self-pay

## 2020-07-21 ENCOUNTER — Encounter (HOSPITAL_COMMUNITY): Payer: Self-pay

## 2020-07-21 ENCOUNTER — Emergency Department (HOSPITAL_COMMUNITY)
Admission: EM | Admit: 2020-07-21 | Discharge: 2020-07-21 | Disposition: A | Discharge status: Home / Self Care | Payer: Medicare PPO | Attending: Emergency Medicine | Admitting: Emergency Medicine

## 2020-07-21 DIAGNOSIS — F201 Disorganized schizophrenia: Secondary | ICD-10-CM | POA: Insufficient documentation

## 2020-07-21 DIAGNOSIS — Z79899 Other long term (current) drug therapy: Secondary | ICD-10-CM | POA: Diagnosis not present

## 2020-07-21 DIAGNOSIS — I110 Hypertensive heart disease with heart failure: Secondary | ICD-10-CM | POA: Diagnosis not present

## 2020-07-21 DIAGNOSIS — F17210 Nicotine dependence, cigarettes, uncomplicated: Secondary | ICD-10-CM | POA: Insufficient documentation

## 2020-07-21 DIAGNOSIS — F0390 Unspecified dementia without behavioral disturbance: Secondary | ICD-10-CM | POA: Insufficient documentation

## 2020-07-21 DIAGNOSIS — E871 Hypo-osmolality and hyponatremia: Secondary | ICD-10-CM | POA: Diagnosis not present

## 2020-07-21 DIAGNOSIS — Z20822 Contact with and (suspected) exposure to covid-19: Secondary | ICD-10-CM | POA: Diagnosis not present

## 2020-07-21 DIAGNOSIS — I10 Essential (primary) hypertension: Secondary | ICD-10-CM

## 2020-07-21 DIAGNOSIS — F200 Paranoid schizophrenia: Secondary | ICD-10-CM | POA: Insufficient documentation

## 2020-07-21 DIAGNOSIS — I5022 Chronic systolic (congestive) heart failure: Secondary | ICD-10-CM | POA: Insufficient documentation

## 2020-07-21 DIAGNOSIS — J449 Chronic obstructive pulmonary disease, unspecified: Secondary | ICD-10-CM | POA: Insufficient documentation

## 2020-07-21 DIAGNOSIS — Z046 Encounter for general psychiatric examination, requested by authority: Secondary | ICD-10-CM | POA: Diagnosis present

## 2020-07-21 LAB — COMPREHENSIVE METABOLIC PANEL
ALT: 35 U/L (ref 0–44)
AST: 35 U/L (ref 15–41)
Albumin: 4.4 g/dL (ref 3.5–5.0)
Alkaline Phosphatase: 230 U/L — ABNORMAL HIGH (ref 38–126)
Anion gap: 8 (ref 5–15)
BUN: 9 mg/dL (ref 6–20)
CO2: 23 mmol/L (ref 22–32)
Calcium: 8.7 mg/dL — ABNORMAL LOW (ref 8.9–10.3)
Chloride: 97 mmol/L — ABNORMAL LOW (ref 98–111)
Creatinine, Ser: 0.52 mg/dL — ABNORMAL LOW (ref 0.61–1.24)
GFR, Estimated: >60 mL/min (ref >60)
Glucose, Bld: 100 mg/dL — ABNORMAL HIGH (ref 70–99)
Potassium: 4.2 mmol/L (ref 3.5–5.1)
Sodium: 128 mmol/L — ABNORMAL LOW (ref 135–145)
Total Bilirubin: 0.5 mg/dL (ref 0.3–1.2)
Total Protein: 7.8 g/dL (ref 6.5–8.1)

## 2020-07-21 LAB — CBC WITH DIFFERENTIAL/PLATELET
Abs Immature Granulocytes: 0.02 10*3/uL (ref 0.00–0.07)
Basophils Absolute: 0 10*3/uL (ref 0.0–0.1)
Basophils Relative: 1 %
Eosinophils Absolute: 0.1 10*3/uL (ref 0.0–0.5)
Eosinophils Relative: 1 %
HCT: 43.4 % (ref 39.0–52.0)
Hemoglobin: 14.2 g/dL (ref 13.0–17.0)
Immature Granulocytes: 0 %
Lymphocytes Relative: 15 %
Lymphs Abs: 1 10*3/uL (ref 0.7–4.0)
MCH: 23.7 pg — ABNORMAL LOW (ref 26.0–34.0)
MCHC: 32.7 g/dL (ref 30.0–36.0)
MCV: 72.3 fL — ABNORMAL LOW (ref 80.0–100.0)
Monocytes Absolute: 0.7 10*3/uL (ref 0.1–1.0)
Monocytes Relative: 10 %
Neutro Abs: 5 10*3/uL (ref 1.7–7.7)
Neutrophils Relative %: 73 %
Platelets: 166 10*3/uL (ref 150–400)
RBC: 6 MIL/uL — ABNORMAL HIGH (ref 4.22–5.81)
RDW: 15.6 % — ABNORMAL HIGH (ref 11.5–15.5)
WBC: 6.9 10*3/uL (ref 4.0–10.5)
nRBC: 0 % (ref 0.0–0.2)

## 2020-07-21 LAB — ETHANOL: Alcohol, Ethyl (B): <10 mg/dL (ref <10)

## 2020-07-21 LAB — RESPIRATORY PANEL BY RT PCR (FLU A&B, COVID)
Influenza A by PCR: NEGATIVE
Influenza B by PCR: NEGATIVE
SARS Coronavirus 2 by RT PCR: NEGATIVE

## 2020-07-21 LAB — DIGOXIN LEVEL: Digoxin Level: 0.6 ng/mL — ABNORMAL LOW (ref 1.0–2.0)

## 2020-07-21 LAB — MAGNESIUM: Magnesium: 1.8 mg/dL (ref 1.7–2.4)

## 2020-07-21 LAB — PHENYTOIN LEVEL, TOTAL: Phenytoin Lvl: 11.9 ug/mL (ref 10.0–20.0)

## 2020-07-21 MED ORDER — PALIPERIDONE PALMITATE ER 234 MG/1.5ML IM SUSY
234.0000 mg | PREFILLED_SYRINGE | Freq: Once | INTRAMUSCULAR | Status: AC
  Administered 2020-07-21: 234 mg via INTRAMUSCULAR
  Filled 2020-07-21 (×2): qty 1.5

## 2020-07-21 MED ORDER — ATORVASTATIN CALCIUM 40 MG PO TABS
40.0000 mg | ORAL_TABLET | Freq: Every day | ORAL | Status: DC
  Administered 2020-07-21: 40 mg via ORAL
  Filled 2020-07-21: qty 1

## 2020-07-21 MED ORDER — PHENYTOIN SODIUM EXTENDED 100 MG PO CAPS
300.0000 mg | ORAL_CAPSULE | Freq: Two times a day (BID) | ORAL | Status: DC
  Administered 2020-07-21: 300 mg via ORAL
  Filled 2020-07-21: qty 3

## 2020-07-21 MED ORDER — SACUBITRIL-VALSARTAN 24-26 MG PO TABS
1.0000 | ORAL_TABLET | Freq: Two times a day (BID) | ORAL | Status: DC
  Administered 2020-07-21: 1 via ORAL
  Filled 2020-07-21: qty 1

## 2020-07-21 MED ORDER — IPRATROPIUM-ALBUTEROL 20-100 MCG/ACT IN AERS: 1.0000 | INHALATION_SPRAY | Freq: Four times a day (QID) | RESPIRATORY_TRACT | Status: DC | PRN

## 2020-07-21 MED ORDER — FLUTICASONE FUROATE-VILANTEROL 200-25 MCG/INH IN AEPB
1.0000 | INHALATION_SPRAY | Freq: Every day | RESPIRATORY_TRACT | Status: DC
  Filled 2020-07-21: qty 28

## 2020-07-21 MED ORDER — HALOPERIDOL LACTATE 5 MG/ML IJ SOLN
5.0000 mg | Freq: Once | INTRAMUSCULAR | Status: AC
  Administered 2020-07-21: 5 mg via INTRAVENOUS
  Filled 2020-07-21: qty 1

## 2020-07-21 MED ORDER — DIGOXIN 250 MCG PO TABS
0.2500 mg | ORAL_TABLET | Freq: Every day | ORAL | Status: DC
  Administered 2020-07-21: 0.25 mg via ORAL
  Filled 2020-07-21: qty 1

## 2020-07-21 MED ORDER — BUSPIRONE HCL 10 MG PO TABS
10.0000 mg | ORAL_TABLET | Freq: Three times a day (TID) | ORAL | Status: DC
  Administered 2020-07-21: 10 mg via ORAL
  Filled 2020-07-21: qty 1

## 2020-07-21 MED ORDER — PREGABALIN 50 MG PO CAPS
75.0000 mg | ORAL_CAPSULE | Freq: Three times a day (TID) | ORAL | Status: DC
  Administered 2020-07-21: 75 mg via ORAL
  Filled 2020-07-21: qty 1

## 2020-07-21 MED ORDER — MELATONIN 3 MG PO TABS: 3.0000 mg | ORAL_TABLET | Freq: Every evening | ORAL | Status: DC | PRN

## 2020-07-21 MED ORDER — LACTATED RINGERS IV BOLUS
1000.0000 mL | Freq: Once | INTRAVENOUS | Status: AC
  Administered 2020-07-21: 1000 mL via INTRAVENOUS

## 2020-07-21 MED ORDER — DIGOXIN 125 MCG PO TABS
0.1250 mg | ORAL_TABLET | Freq: Every day | ORAL | Status: DC
  Filled 2020-07-21: qty 1

## 2020-07-21 MED ORDER — MAGNESIUM OXIDE 400 (241.3 MG) MG PO TABS
400.0000 mg | ORAL_TABLET | Freq: Every day | ORAL | Status: DC
  Administered 2020-07-21: 400 mg via ORAL
  Filled 2020-07-21: qty 1

## 2020-07-21 MED ORDER — QUETIAPINE FUMARATE 100 MG PO TABS
200.0000 mg | ORAL_TABLET | Freq: Every day | ORAL | Status: DC
  Administered 2020-07-21: 200 mg via ORAL
  Filled 2020-07-21: qty 2

## 2020-07-21 MED ORDER — ACETAMINOPHEN 325 MG PO TABS: 650.0000 mg | ORAL_TABLET | ORAL | Status: DC | PRN

## 2020-07-21 MED ORDER — ONDANSETRON HCL 4 MG PO TABS: 4.0000 mg | ORAL_TABLET | Freq: Three times a day (TID) | ORAL | Status: DC | PRN

## 2020-07-21 MED ORDER — FUROSEMIDE 40 MG PO TABS
40.0000 mg | ORAL_TABLET | Freq: Every day | ORAL | Status: DC
  Administered 2020-07-21: 40 mg via ORAL
  Filled 2020-07-21: qty 1

## 2020-07-21 MED ORDER — ALUM & MAG HYDROXIDE-SIMETH 200-200-20 MG/5ML PO SUSP: 30.0000 mL | Freq: Four times a day (QID) | ORAL | Status: DC | PRN

## 2020-07-21 MED ORDER — ALPRAZOLAM 0.5 MG PO TABS
0.5000 mg | ORAL_TABLET | Freq: Two times a day (BID) | ORAL | Status: DC
  Administered 2020-07-21: 0.5 mg via ORAL
  Filled 2020-07-21: qty 1

## 2020-07-21 MED ORDER — NICOTINE 14 MG/24HR TD PT24: 14.0000 mg | MEDICATED_PATCH | Freq: Every day | TRANSDERMAL | Status: DC

## 2020-07-21 NOTE — ED Notes (Addendum)
[facility] called for transport. Attempted to call [facility] with no answer at this time.

## 2020-07-21 NOTE — ED Notes (Signed)
Pt out of bed, standing at end of bed, tangled in cords, redirected back to bed.

## 2020-07-21 NOTE — ED Provider Notes (Signed)
[facility]-EMERGENCY DEPT Provider Note   CSN: [id number] Arrival date & time: 07/21/20  0037   History Chief Complaint  Patient presents with   Psychiatric Evaluation    Jim Dunn is a 54 y.o. male.  The history is provided by the patient. The history is limited by the condition of the patient (Psychiatric disorder).  He has history of seizure disorder, hyponatremia, paranoid schizophrenia and comes in stating that other people are hurting him in his brain.  He states that they are telling him things that are not in the Bible.  He is hearing voices of people telling him bad things.  He denies homicidal or suicidal ideation.  He denies alcohol or drug use.  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   Chronic post-traumatic stress disorder (PTSD)    Noncompliance with medications    Subtherapeutic serum dilantin level    Seizure (HCC)    Hypomagnesemia 02/23/2020   Suicidal ideation 02/23/2020   Hyponatremia 02/19/2020   Paranoid schizophrenia (HCC)    Hypokalemia    Chronic systolic CHF (congestive heart failure) (HCC) 01/22/2020   Mixed hyperlipidemia 01/22/2020   COPD (chronic obstructive pulmonary disease) (HCC) 01/22/2020   Microcytic anemia 01/22/2020   Chronic hyponatremia 01/22/2020   Seizure disorder (HCC) 01/22/2020    Past Surgical History:  Procedure Laterality Date   KIDNEY STONE SURGERY     MULTIPLE EXTRACTIONS WITH ALVEOLOPLASTY Bilateral 04/29/2020   Procedure: Extraction of tooth #'s 2-13, 17,18, 21-25, and 27-31 with alveoloplasty and bilateral lingual exostoses  reductions.;  Surgeon: [name], DDS;  Location: [facility] OR;  Service: Oral Surgery;  Laterality: Bilateral;       Family History  Problem Relation Age of Onset   Heart disease Other     Social History   Tobacco Use   Smoking status: Current Every Day Smoker    Packs/day: 1.00    Types: Cigarettes   Smokeless tobacco: Current User    Types: Chew  Vaping Use   Vaping Use: Never used  Substance Use Topics   Alcohol use: Not Currently   Drug use: Never    Home Medications Prior to Admission medications   Medication Sig Start Date End Date Taking? Authorizing Provider  acetaminophen (TYLENOL) 325 MG tablet Take 2 tablets (650 mg total) by mouth every 4 (four) hours as needed for mild pain or fever. 06/07/20   Russella Dar, NP  ALPRAZolam Prudy Feeler) 0.5 MG tablet Take 1 tablet (0.5 mg total) by mouth 2 (two) times daily. 06/07/20   Russella Dar, NP  atorvastatin (LIPITOR) 40 MG tablet Take 1 tablet (40 mg total) by mouth daily. 06/07/20   Russella Dar, NP  busPIRone (BUSPAR) 10 MG tablet Take 1 tablet (10 mg total) by mouth 3 (three) times daily. 06/07/20   Russella Dar, NP  digoxin (LANOXIN) 0.125 MG tablet Take 1 tablet (0.125 mg total) by mouth daily. 06/07/20   Russella Dar, NP  fluticasone furoate-vilanterol (BREO ELLIPTA) 200-25 MCG/INH AEPB Inhale 1 puff into the lungs daily. 06/08/20   Russella Dar, NP  furosemide (LASIX) 40 MG tablet Take 1 tablet (40 mg total) by mouth daily. 06/08/20   Russella Dar, NP  INVEGA SUSTENNA 234 MG/1.5ML SUSY injection Inject 234 mg into the muscle every 30 (thirty) days. 06/07/20   Russella Dar, NP  Ipratropium-Albuterol (COMBIVENT) 20-100 MCG/ACT AERS respimat Inhale 1 puff into the lungs every 6 (six) hours as needed for wheezing or shortness of breath. 06/07/20   Russella Dar, NP  magnesium oxide (MAG-OX) 400 (241.3 Mg) MG tablet Take 1 tablet (400 mg total) by mouth daily. 06/08/20   Russella Dar, NP   melatonin 3 MG TABS tablet Take 1 tablet (3 mg total) by mouth at bedtime as needed (Use first for sleep). 06/07/20   Russella Dar, NP  phenytoin (DILANTIN) 300 MG ER capsule Take 1 capsule (300 mg total) by mouth 2 (two) times daily. 06/07/20   Russella Dar, NP  pregabalin (LYRICA) 75 MG capsule Take 1 capsule (75 mg total) by mouth 3 (three) times daily. 06/07/20   Russella Dar, NP  QUEtiapine (SEROQUEL) 200 MG tablet Take 1 tablet (200 mg total) by mouth daily. 06/08/20   Russella Dar, NP  sacubitril-valsartan (ENTRESTO) 24-26 MG Take 1 tablet by mouth 2 (two) times daily. 06/07/20   Russella Dar, NP  zolpidem (AMBIEN) 10 MG tablet Take 1 tablet (10 mg total) by mouth at bedtime as needed (Use if melatonin not effective). 06/07/20   Russella Dar, NP    Allergies    Patient has no known allergies.  Review of Systems   Review of Systems  Unable to perform ROS: Psychiatric disorder  All other systems reviewed and are negative.   Physical Exam Updated Vital Signs There were no vitals taken for this visit.  Physical Exam Vitals and nursing note reviewed.   54 year old male, resting comfortably and in no acute distress. Vital signs are significant for rapid heart rate and mildly elevated diastolic blood pressure. Oxygen saturation is 96%, which is normal. Head is normocephalic and atraumatic. PERRLA, EOMI. Oropharynx is clear. Neck is nontender and supple without adenopathy or JVD. Back is nontender and there is no CVA tenderness. Lungs are clear without rales, wheezes, or rhonchi. Chest is nontender. Heart is tachycardic without murmur. Abdomen is soft, flat, nontender without masses or hepatosplenomegaly and peristalsis is normoactive. Extremities have no cyanosis or edema, full range of motion is present. Skin is warm and dry without rash. Neurologic: Awake and alert, cranial nerves are intact, moves all extremities equally.  ED Results / Procedures /  Treatments   Labs (all labs ordered are listed, but only abnormal results are displayed) Labs Reviewed  COMPREHENSIVE METABOLIC PANEL - Abnormal; Notable for the following components:      Result Value   Sodium 128 (*)    Chloride 97 (*)    Glucose, Bld 100 (*)    Creatinine, Ser 0.52 (*)    Calcium 8.7 (*)    Alkaline Phosphatase 230 (*)    All other components within normal limits  CBC WITH DIFFERENTIAL/PLATELET - Abnormal; Notable for the following components:   RBC 6.00 (*)    MCV 72.3 (*)    MCH 23.7 (*)    RDW 15.6 (*)    All other components within normal limits  DIGOXIN LEVEL - Abnormal; Notable for the following components:   Digoxin Level 0.6 (*)    All other components within normal limits  RESPIRATORY PANEL BY RT PCR (  FLU A&B, COVID)  ETHANOL  PHENYTOIN LEVEL, TOTAL  MAGNESIUM  RAPID URINE DRUG SCREEN, HOSP PERFORMED    EKG EKG Interpretation  Date/Time:  Wednesday July 21 2020 00:54:36 EST Ventricular Rate:  130 PR Interval:    QRS Duration: 95 QT Interval:  284 QTC Calculation: 418 R Axis:   103 Text Interpretation: Sinus tachycardia Probable left atrial enlargement Right axis deviation Consider left ventricular hypertrophy Nonspecific T abnormalities, lateral leads Anterior ST elevation, probably due to LVH When compared with ECG of 06/15/2020, No significant change was found Confirmed by Dione Booze ([id number]) on 07/21/2020 1:02:51 AM  Procedures Procedures  CRITICAL CARE Performed by: Dione Booze Total critical care time: 35 minutes Critical care time was exclusive of separately billable procedures and treating other patients. Critical care was necessary to treat or prevent imminent or life-threatening deterioration. Critical care was time spent personally by me on the following activities: development of treatment plan with patient and/or surrogate as well as nursing, discussions with consultants, evaluation of patient's response to treatment,  examination of patient, obtaining history from patient or surrogate, ordering and performing treatments and interventions, ordering and review of laboratory studies, ordering and review of radiographic studies, pulse oximetry and re-evaluation of patient's condition.  Medications Ordered in ED Medications  lactated ringers bolus 1,000 mL (has no administration in time range)  lactated ringers bolus 1,000 mL (has no administration in time range)  haloperidol lactate (HALDOL) injection 5 mg (has no administration in time range)  nicotine (NICODERM CQ - dosed in mg/24 hours) patch 14 mg (has no administration in time range)  alum & mag hydroxide-simeth (MAALOX/MYLANTA) 200-200-20 MG/5ML suspension 30 mL (has no administration in time range)  ondansetron (ZOFRAN) tablet 4 mg (has no administration in time range)  acetaminophen (TYLENOL) tablet 650 mg (has no administration in time range)  ALPRAZolam (XANAX) tablet 0.5 mg (has no administration in time range)  atorvastatin (LIPITOR) tablet 40 mg (has no administration in time range)  busPIRone (BUSPAR) tablet 10 mg (has no administration in time range)  digoxin (LANOXIN) tablet 0.125 mg (has no administration in time range)  fluticasone furoate-vilanterol (BREO ELLIPTA) 200-25 MCG/INH 1 puff (has no administration in time range)  furosemide (LASIX) tablet 40 mg (has no administration in time range)  Ipratropium-Albuterol (COMBIVENT) respimat 1 puff (has no administration in time range)  magnesium oxide (MAG-OX) tablet 400 mg (has no administration in time range)  melatonin tablet 3 mg (has no administration in time range)  phenytoin (DILANTIN) ER capsule 300 mg (has no administration in time range)  pregabalin (LYRICA) capsule 75 mg (has no administration in time range)  QUEtiapine (SEROQUEL) tablet 200 mg (has no administration in time range)  sacubitril-valsartan (ENTRESTO) 24-26 mg per tablet (has no administration in time range)    ED Course   I have reviewed the triage vital signs and the nursing notes.  Pertinent lab results that were available during my care of the patient were reviewed by me and considered in my medical decision making (see chart for details).  MDM Rules/Calculators/A&P Acute exacerbation of paranoid schizophrenia.  Will check screening labs.  Old records are reviewed, and he has prior ED visits for paranoid schizophrenia as well as hospitalizations for hyponatremia.  ECG shows sinus tachycardia, will give IV fluids.  Labs show hyponatremia.  Phenytoin level is therapeutic.  Patient got progressively more agitated and was given a dose of haloperidol.  He is felt to be medically cleared and TTS consultation is requested.  Final Clinical Impression(s) / ED Diagnoses Final diagnoses:  Paranoid schizophrenia (HCC)  Hyponatremia  Elevated blood pressure reading with diagnosis of hypertension    Rx / DC Orders ED Discharge Orders    None       Dione Booze, MD 07/21/20 [phone]

## 2020-07-21 NOTE — ED Notes (Signed)
Pt discharged from this facility back to [facility] at this time via [place]. Report given to PTAR with no further questions. Pt ambulatory to stretcher without issue, alert oriented per baseline.

## 2020-07-21 NOTE — ED Provider Notes (Signed)
  Physical Exam  BP (!) 166/93   Pulse (!) 116   Temp 99.2 F (37.3 C) (Oral)   Resp 18 Comment: Simultaneous filing. User may not have seen previous data.  Ht 5\' 8"  (1.727 m)   Wt 103.4 kg   SpO2 100%   BMI 34.67 kg/m   Physical Exam  ED Course/Procedures     Procedures  MDM  Discussed with from psychiatry.  After discussion we discussed changing the Seroquel dose but we have come up with giving the Invega 2 days early to help get that adjusted.  Has follow-up in 2 days.  Hopefully can stay on a better schedule for the [facility].  Psychiatrically cleared and will discharge.       [facility], MD 07/21/20 [phone]

## 2020-07-21 NOTE — ED Triage Notes (Signed)
54 yo male BIBA from [place] facility. Has extensive history of mental illness. Pt has history of dementia, paranoid schizophrenia, and PTSD. Pt c/o of intrusive thoughts x 4 days. Pt states he has thoughts of hurting himself and other but has not acted on it. Pt states he is compliant with medications. Pt is AOx4. Pt denies any pain at this time.  Vitals: Bp 138/70 Hr 130 rr 18 spo2 94 on RA Temp 98.3

## 2020-07-21 NOTE — BH Assessment (Signed)
[facility] Assessment Progress Note  Per Shuvon Rankin, NP this pt does not require psychiatric hospitalization at this time.  Pt is psychiatrically cleared.  Discharge instructions advise pt to continue treatment with his current unspecified outpatient provider.  Pt is reportedly to receive an Invega injection prior to discharge.  EDP Benjiman Core, MD and pt's nurse, Colin Mulders, have been notified.  Doylene Canning, [place] [facility] Coordinator [phone]

## 2020-07-21 NOTE — ED Notes (Signed)
Pt to be discharged following Invega injection. Delay in receiving injection due to in hospital supply. Once medication received, medication to be administered ASAP. Pt to return to [facility] at that time.

## 2020-07-21 NOTE — ED Notes (Signed)
Pt cont moving. BP not accurate at this time. Will attempt again once pt settles.

## 2020-07-21 NOTE — BH Assessment (Signed)
Per Berneice Heinrich, NP, patient does not meet inpatient admission criteria.  He can return to the ALF where he lives.  He is scheduled for his Invega Injection on 07/23/2020.

## 2020-07-21 NOTE — ED Notes (Signed)
Case discussed with Dr. Preston Fleeting as pt's HR consistently in the 130's and Sodium level is 128. Pt c/o feeling increased anxiety. Awake, alert, cooperative at this time. Cont to monitor.

## 2020-07-21 NOTE — ED Notes (Signed)
Medicated a/o. Pt speaking with psych on TTS.

## 2020-07-21 NOTE — BH Assessment (Signed)
Comprehensive Clinical Assessment (CCA) Note  07/21/2020 Jim Dunn [id number]   Patient was brought to the [facility] via EMS from [facility].  Patient was just seen in the ED approximately 1 month ago for essentially the same presentation.  At that time, patient was given an Invega Injection on 06/14/2020 and was due for another one 30 days later.  Patient is presenting as delusional, paranoid, experiencing auditory hallucinations and he is somewhat disorganized.  Patient is diagnosed with dementia and schizophrenia.  Patient denies SI/HI and does not appear to be a threat to himself or others.  TTS contacted the facility where he lives and inquired to see if he had his Invega Injection on 07/15/2020.  Patient has not had his injection and he is scheduled to get it on 07/23/2020. He is a week overdue. Patient states that he has not been sleeping at all, he states, "I can't sleep because people are fucking with me in my mind." Patient states that he is hearing voices of people who say they are going to hurt him.  He is oriented to time, person, place and situation.  Patient states that he is aware he has mental illness and that he hears voices, but he states that in his mind the voices are real.  He states that he just wants to get some medication to put him to sleep because he states that he cannot take continually being tormented by what is going on in his brain.   Previous assessment note dated 06/16/2020 by [facility]: Jim Dunn is a 54 y.o. male who presented to [facility] on voluntary basis with complaint of agitation and also suicidal ideation.  Pt lives in [place] at the [facility].  Pt receives outpatient psychiatric services through a provider with the ALF for treatment of schizophrenia.  Pt was last assessed by TTS on 1011/21 with complaint of suicidal ideation and hallucination. He was given a 234 mg dose of Invega and transferred back to [facility].  Per history, Pt has  diagnoses of Schizophrenia and also Major Neurocognitive Disorder (resulting in memory and cognitive deficiencies).  Pt stated that he became very agitated last night due to ongoing experience of auditory hallucination and rumination -- voices that are experienced as hallucination and also in his head threatening him with hell and cursing at him.  The ALF called EMS, and Pt was transported to the hospital.  Pt said that he wants to die -- ''I want to sleep and never wake up.''  Pt denied any specific suicidal plan or intent, and he denied past suicide attempt.  Pt denied current hallucination.  He also denied homicidal ideation, self-injurious behavior, and substance use concerns.   Pt said he wanted help or to ''go to sleep forever.''  Per history, Pt is his own guardian.  He is prescribed Invega on a monthly basis.    During assessment, Pt presented as alert and oriented.  He had good eye contact and was cooperative.  Pt appeared appropriately groomed.  Pt's mood was reported as depressed.  Affect was preoccupied.  Pt's speech was normal in rate, rhythm, and volume.  Thought processes were within normal range, and thought content was logical and goal-oriented.  There was no evidence of delusion.  Pt's memory and concentration were fair.  Insight and judgment were poor.  Impulse control was fair.  Consulted with S. Rankin, NP, who determined that Pt does not meet inpatient criteria -- presentation is the same as  it was when discharged on 10/11.  It is recommended that he follow-up with his outpatient provider.       Chief Complaint:  Chief Complaint  Patient presents with  . Psychiatric Evaluation   Visit Diagnosis:  F20.1 Schizophrenia   CCA Screening, Triage and Referral (STR)  Patient Reported Information How did you hear about [place]? Other (Comment) (sent from the facility that he resides in)  Referral name: [facility]  Referral phone number: [phone]   Whom do you  see for routine medical problems? I don't have a doctor  Practice/Facility Name: No data recorded Practice/Facility Phone Number: No data recorded Name of Contact: No data recorded Contact Number: No data recorded Contact Fax Number: No data recorded Prescriber Name: No data recorded Prescriber Address (if known): No data recorded  What Is the Reason for Your Visit/Call Today? Patient is delusional, paranoid and disorganized.  Patient has a history of schizophrenia and dementia.  How Long Has This Been Causing You Problems? > than 6 months  What Do You Feel Would Help You the Most Today? Medication   Have You Recently Been in Any Inpatient Treatment (Hospital/Detox/Crisis Center/28-Day Program)? No  Name/Location of Program/Hospital:Inpatient admission to [facility] from 02/2020-06/08/2020  How Long Were You There? No data recorded When Were You Discharged? No data recorded  Have You Ever Received Services From [facility] Before? Yes  Who Do You See at [facility]? Inpatient admission   Have You Recently Had Any Thoughts About Hurting Yourself? Yes  Are You Planning to Commit Suicide/Harm Yourself At This time? No   Have you Recently Had Thoughts About Hurting Someone [name]? No  Explanation: No data recorded  Have You Used Any Alcohol or Drugs in the Past 24 Hours? No  How Long Ago Did You Use Drugs or Alcohol? No data recorded What Did You Use and How Much? No data recorded  Do You Currently Have a Therapist/Psychiatrist? No  Name of Therapist/Psychiatrist: No data recorded  Have You Been Recently Discharged From Any Office Practice or Programs? No  Explanation of Discharge From Practice/Program: No data recorded    CCA Screening Triage Referral Assessment Type of Contact: Tele-Assessment  Is this Initial or Reassessment? Initial Assessment  Date Telepsych consult ordered in [facility]:  07/21/20  Time Telepsych consult ordered in [facility]:  0453   Patient Reported  Information Reviewed? Yes  Patient Left Without Being Seen? No data recorded Reason for Not Completing Assessment: Pt is asleep and cannot be aroused at this time. Will attempt again later.   Collateral Involvement: No collateral information available   Does Patient Have a Court Appointed Legal Guardian? No data recorded Name and Contact of Legal Guardian: No data recorded If Minor and Not Living with Parent(s), Who has Custody? No data recorded Is CPS involved or ever been involved? Never  Is APS involved or ever been involved? Never   Patient Determined To Be At Risk for Harm To Self or Others Based on Review of Patient Reported Information or Presenting Complaint? No  Method: No data recorded Availability of Means: No data recorded Intent: No data recorded Notification Required: No data recorded Additional Information for Danger to Others Potential: No data recorded Additional Comments for Danger to Others Potential: No data recorded Are There Guns or Other Weapons in Your Home? No data recorded Types of Guns/Weapons: No data recorded Are These Weapons Safely Secured?  No data recorded Who Could Verify You Are Able To Have These Secured: No data recorded Do You Have any Outstanding Charges, Pending Court Dates, Parole/Probation? No data recorded Contacted To Inform of Risk of Harm To Self or Others: No data recorded  Location of Assessment: [facility] ED   Does Patient Present under Involuntary Commitment? No  IVC Papers Initial File Date: No data recorded  [place] of Residence: [place]   Patient Currently Receiving the Following Services: Medication Management   Determination of Need: Urgent (48 hours)   Options For Referral: Medication Management     CCA Biopsychosocial Intake/Chief Complaint:  Patient was brought to the [facility] via EMS from [facility].  Patient was just seen in the ED approximately 1 month ago for essentially the same  presentation.  At that time, patient was given an Invega Injection on 06/14/2020 and was due for another one 30 days later.  Patient is presenting as delusional, paranoid, experiencing auditory hallucinations and he is somewhat disorganized.  Patient is diagnosed with dementia and schizophrenia.  Patient denies SI/HI and does not appear to be a threat to himself or others.  TTS contacted the facility where he lives and inquired to see if he had his Invega Injection on 07/15/2020.  Patient has not had his injection and he is scheduled to get it on 07/23/2020.  Patient states that he has not been sleeping at all, he states that he is hearing voices of people who say they are going to hurt him.  He is oriented to time, person, place and situation.  Patient states that he is aware he has mental illness and that he hears voices, but he states that in his mind the voices are real.  He states that he just wants to get some medication to put him to sleep because he states that he cannot take continually being tormented by what is going on in his brain.  Current Symptoms/Problems: Patient is disorganized, delusional and experiencing auditory hallucinations   Patient Reported Schizophrenia/Schizoaffective Diagnosis in Past: No data recorded  Strengths: UTA  Preferences: UTA  Abilities: UTA   Type of Services Patient Feels are Needed: Medication management   Initial Clinical Notes/Concerns: No data recorded  Mental Health Symptoms Depression:  Sleep (too much or little);Difficulty Concentrating   Duration of Depressive symptoms: Greater than two weeks   Mania:  None   Anxiety:   None   Psychosis:  Hallucinations;Delusions;Grossly disorganized or catatonic behavior   Duration of Psychotic symptoms: Greater than six months   Trauma:  None   Obsessions:  None   Compulsions:  None   Inattention:  None   Hyperactivity/Impulsivity:  N/A   Oppositional/Defiant Behaviors:  None   Emotional  Irregularity:  None   Other Mood/Personality Symptoms:  No data recorded   Mental Status Exam Appearance and self-care  Stature:  No data recorded  Weight:  Average weight   Clothing:  Disheveled   Grooming:  Neglected   Cosmetic use:  None   Posture/gait:  Normal   Motor activity:  Not Remarkable   Sensorium  Attention:  Normal   Concentration:  Scattered   Orientation:  Object;Person;Place;Situation;Time   Recall/memory:  Defective in Recent;Defective in Remote   Affect and Mood  Affect:  Flat;Blunted   Mood:  Depressed   Relating  Eye contact:  Normal   Facial expression:  Depressed   Attitude toward examiner:  Cooperative   Thought and Language  Speech flow: Pressured   Thought content:  Delusions;Ideas of Reference;Persecutions   Preoccupation:  Ruminations;Other (Comment) (ruminations about getting sick 34 years ago and ending up in his current situation)   Hallucinations:  Auditory   Organization:  No data recorded  [employer] of Knowledge:  Fair   Intelligence:  Average   Abstraction:  Concrete   Judgement:  Fair   Reality Testing:  Distorted   Insight:  Lacking   Decision Making:  Confused   Social Functioning  Social Maturity:  Impulsive   Social Judgement:  Victimized   Stress  Stressors:  Other (Comment) (none reported)   Coping Ability:  Exhausted   Skill Deficits:  Decision making;Self-care   Supports:  Support needed     Religion: Religion/Spirituality Are You A Religious Person?:  (not assessed)  Leisure/Recreation: Leisure / Recreation Do You Have Hobbies?: No  Exercise/Diet: Exercise/Diet Do You Exercise?: No Have You Gained or Lost A Significant Amount of Weight in the Past Six Months?: No Do You Follow a Special Diet?: No   CCA Employment/Education Employment/Work Situation: Employment / Work Situation Employment situation: On disability Why is patient on disability: mental health  issues/schizophrenia How long has patient been on disability: patient states that he has been sick since age 58 What is the longest time patient has a held a job?: N/A Where was the patient employed at that time?: n/A Has patient ever been in the [employer]?: No  Education: Education Is Patient Currently Attending School?: No Last Grade Completed:  (not assessed) Name of High School: not assessed Did Garment/textile technologist From [employer]?:  (not assessed) Did You Attend College?: No Did You Attend Graduate School?: No Did You Have An Individualized Education Program (IIEP): No Did You Have Any Difficulty At School?: No Patient's Education Has Been Impacted by Current Illness: No   CCA Family/Childhood History Family and Relationship History: Family history Marital status: Single Are you sexually active?: No What is your sexual orientation?: heterosexual Does patient have children?: Yes How many children?: 2 How is patient's relationship with their children?: patient does not have a relationship with them  Childhood History:  Childhood History By whom was/is the patient raised?: Grandparents Additional childhood history information: Patient states, "I did not have a mother,"  but did not elaborate on what he meant by that. Description of patient's relationship with caregiver when they were a child: Patient states that he was close to his grandmother growing up Patient's description of current relationship with people who raised him/her: Patient states that his grandmother is deceased How were you disciplined when you got in trouble as a child/adolescent?: Patient states that he was disciplined appropriately, denies any abuse Does patient have siblings?: Yes Number of Siblings: 2 Description of patient's current relationship with siblings: patient does not have a close relationship to his siblings Did patient suffer any verbal/emotional/physical/sexual abuse as a child?: No Did patient  suffer from severe childhood neglect?: No Has patient ever been sexually abused/assaulted/raped as an adolescent or adult?: No Was the patient ever a victim of a crime or a disaster?: No Witnessed domestic violence?: No Has patient been affected by domestic violence as an adult?: No  Child/Adolescent Assessment:     CCA Substance Use Alcohol/Drug Use: Alcohol / Drug Use Pain Medications: See MAR Prescriptions: See MAR Over the Counter: See MAR History of alcohol / drug use?: No history of alcohol / drug abuse (patient states that he has not used any substances in 11 years)  ASAM's:  Six Dimensions of Multidimensional Assessment  Dimension 1:  Acute Intoxication and/or Withdrawal Potential:      Dimension 2:  Biomedical Conditions and Complications:      Dimension 3:  Emotional, Behavioral, or Cognitive Conditions and Complications:     Dimension 4:  Readiness to Change:     Dimension 5:  Relapse, Continued use, or Continued Problem Potential:     Dimension 6:  Recovery/Living Environment:     ASAM Severity Score:    ASAM Recommended Level of Treatment:     Substance use Disorder (SUD)    Recommendations for Services/Supports/Treatments:    DSM5 Diagnoses: Patient Active Problem List   Diagnosis Date Noted  . Disorganized schizophrenia (HCC)   . Dementia (HCC) 06/08/2020  . 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 Chronic post-traumatic stress disorder (PTSD)   . Noncompliance with medications   . Subtherapeutic serum dilantin level   . Seizure (HCC)   . Hypomagnesemia 02/23/2020  . Suicidal ideation 02/23/2020  . Hyponatremia 02/19/2020  . Paranoid schizophrenia (HCC)   . Hypokalemia   . Chronic systolic CHF (congestive heart failure) (HCC) 01/22/2020  . Mixed hyperlipidemia 01/22/2020  . COPD (chronic obstructive pulmonary disease) (HCC) 01/22/2020  . Microcytic anemia 01/22/2020  . Chronic  hyponatremia 01/22/2020  . Seizure disorder (HCC) 01/22/2020    Disposition:  Per Berneice Heinrich, NP, patient does not meet inpatient admission criteria.  He can return to the ALF where he lives.  He is scheduled for his Invega Injection on 07/23/2020.   Referrals to Alternative Service(s): Referred to Alternative Service(s):   Place:   Date:   Time:    Referred to Alternative Service(s):   Place:   Date:   Time:    Referred to Alternative Service(s):   Place:   Date:   Time:    Referred to Alternative Service(s):   Place:   Date:   Time:     Xayden Linsey J Tamsin Nader, LCAS

## 2020-07-21 NOTE — ED Notes (Signed)
Pt cont. Attempting to get out of bed. Redirectable, but pt needing continuous reminders. Bed alarm in place.

## 2020-07-21 NOTE — Discharge Instructions (Addendum)
For your behavioral health needs, you are advised to follow up with your current outpatient provider. °

## 2020-07-22 ENCOUNTER — Ambulatory Visit (INDEPENDENT_AMBULATORY_CARE_PROVIDER_SITE_OTHER): Payer: Medicare PPO | Admitting: Pulmonary Disease

## 2020-07-22 ENCOUNTER — Encounter: Payer: Self-pay | Admitting: Pulmonary Disease

## 2020-07-22 VITALS — BP 118/68 | HR 109 | Temp 97.0°F | Ht 68.0 in | Wt 216.4 lb

## 2020-07-22 DIAGNOSIS — G478 Other sleep disorders: Secondary | ICD-10-CM

## 2020-07-22 DIAGNOSIS — R5383 Other fatigue: Secondary | ICD-10-CM | POA: Diagnosis not present

## 2020-07-22 MED ORDER — ESZOPICLONE 2 MG PO TABS: 2.0000 mg | ORAL_TABLET | Freq: Every evening | ORAL | 2 refills | Status: DC | PRN

## 2020-07-22 NOTE — Patient Instructions (Signed)
Multiple medications likely contributing to sleepiness during the day including Xanax, Invega, Seroquel, Lyrica  Since Lunesta worked better for you in the past I will recommend Lunesta 2 mg nightly  Discontinue Ambien and melatonin use  I will be glad to see you back in about 8 weeks  Call with any significant concerns

## 2020-07-22 NOTE — Progress Notes (Signed)
Jim Dunn    [id number]    [birth year]  Primary Care Physician:Pcp, No  Referring Physician: No referring provider defined for this encounter.  Chief complaint:   Patient's chief complaint is sleeping all the time  HPI:  Chronic problem of daytime sleepiness Has difficulty sleeping at night so he takes Ambien and melatonin which are usually used just as needed  Patient stated he had been on Lunesta in the past which worked better for him  He is on multiple medications that could be contributing to daytime somnolence  His sleep at night is not as restful, states he gets scared a lot He does have a history of PTSD, history of congestive heart failure, history of suicidal ideation Seizure disorder, dementia  Multiple medications that could be contributing to daytime sleepiness include Xanax, Invega, Seroquel, Lyrica  Usually tries to go to bed about 930, falls asleep almost immediately May wake up a couple of times during the night, final wake up time about 8:30 AM  Outpatient Encounter Medications as of 07/22/2020  Medication Sig  . acetaminophen (TYLENOL) 325 MG tablet Take 2 tablets (650 mg total) by mouth every 4 (four) hours as needed for mild pain or fever.  . ALPRAZolam (XANAX) 0.5 MG tablet Take 1 tablet (0.5 mg total) by mouth 2 (two) times daily.  [name] atorvastatin (LIPITOR) 40 MG tablet Take 1 tablet (40 mg total) by mouth daily.  . busPIRone (BUSPAR) 10 MG tablet Take 1 tablet (10 mg total) by mouth 3 (three) times daily.  . digoxin (LANOXIN) 0.125 MG tablet Take 1 tablet (0.125 mg total) by mouth daily.  . digoxin (LANOXIN) 0.25 MG tablet Take 0.25 mg by mouth daily.  . fluticasone furoate-vilanterol (BREO ELLIPTA) 200-25 MCG/INH AEPB Inhale 1 puff into the lungs daily.  . furosemide (LASIX) 40 MG tablet Take 1 tablet (40 mg total) by mouth daily.  Hinda Glatter SUSTENNA 234 MG/1.5ML SUSY injection Inject 234 mg into the muscle every 30 (thirty) days.  .  Ipratropium-Albuterol (COMBIVENT) 20-100 MCG/ACT AERS respimat Inhale 1 puff into the lungs every 6 (six) hours as needed for wheezing or shortness of breath.  . magnesium oxide (MAG-OX) 400 (241.3 Mg) MG tablet Take 1 tablet (400 mg total) by mouth daily.  . melatonin 3 MG TABS tablet Take 1 tablet (3 mg total) by mouth at bedtime as needed (Use first for sleep).  . phenytoin (DILANTIN) 300 MG ER capsule Take 1 capsule (300 mg total) by mouth 2 (two) times daily.  . pregabalin (LYRICA) 75 MG capsule Take 1 capsule (75 mg total) by mouth 3 (three) times daily.  . QUEtiapine (SEROQUEL) 200 MG tablet Take 1 tablet (200 mg total) by mouth daily.  . sacubitril-valsartan (ENTRESTO) 24-26 MG Take 1 tablet by mouth 2 (two) times daily.  [name] zolpidem (AMBIEN) 10 MG tablet Take 1 tablet (10 mg total) by mouth at bedtime as needed (Use if melatonin not effective).   No facility-administered encounter medications on file as of 07/22/2020.    Allergies as of 07/22/2020  . (No Known Allergies)    Past Medical History:  Diagnosis Date  . CHF (congestive heart failure) (HCC)   . COPD (chronic obstructive pulmonary disease) (HCC)   . History of kidney stones   . Paranoid schizophrenia (HCC)   . Seizures (HCC)   . Tobacco abuse     Past Surgical History:  Procedure Laterality Date  . KIDNEY STONE SURGERY    .  MULTIPLE EXTRACTIONS WITH ALVEOLOPLASTY Bilateral 04/29/2020   Procedure: Extraction of tooth #'s 2-13, 17,18, 21-25, and 27-31 with alveoloplasty and bilateral lingual exostoses reductions.;  Surgeon: [name], DDS;  Location: [facility] OR;  Service: Oral Surgery;  Laterality: Bilateral;    Family History  Problem Relation Age of Onset  . Heart disease Other     Social History   Socioeconomic History  . Marital status: Single    Spouse name: Not on file  . Number of children: Not on file  . Years of education: Not on file  . Highest education level: Not on file  Occupational  History  . Occupation: Disability  Tobacco Use  . Smoking status: Current Every Day Smoker    Packs/day: 1.00    Types: Cigarettes  . Smokeless tobacco: Current User    Types: Chew  Vaping Use  . Vaping Use: Never used  Substance and Sexual Activity  . Alcohol use: Not Currently  . Drug use: Never  . Sexual activity: Not Currently  Other Topics Concern  . Not on file  Social History Narrative   Pt lives [facility].  He receives outpatient psychiatric services through the ALF.  He stated that prior to moving there, he did not have a psychiatrist.     Social Determinants of Health   Financial Resource Strain:   . Difficulty of Paying Living Expenses: Not on file  Food Insecurity:   . Worried About Programme researcher, broadcasting/film/video in the Last Year: Not on file  . Ran Out of Food in the Last Year: Not on file  Transportation Needs:   . Lack of Transportation (Medical): Not on file  . Lack of Transportation (Non-Medical): Not on file  Physical Activity:   . Days of Exercise per Week: Not on file  . Minutes of Exercise per Session: Not on file  Stress:   . Feeling of Stress : Not on file  Social Connections:   . Frequency of Communication with Friends and Family: Not on file  . Frequency of Social Gatherings with Friends and Family: Not on file  . Attends Religious Services: Not on file  . Active Member of Clubs or Organizations: Not on file  . Attends Banker Meetings: Not on file  . Marital Status: Not on file  Intimate Partner Violence:   . Fear of Current or Ex-Partner: Not on file  . Emotionally Abused: Not on file  . Physically Abused: Not on file  . Sexually Abused: Not on file    Review of Systems  Psychiatric/Behavioral: Positive for sleep disturbance.    Vitals:   07/22/20 1424  BP: 118/68  Pulse: (!) 109  Temp: (!) 97 F (36.1 C)  SpO2: 93%     Physical Exam Constitutional:      Appearance: He is obese.  HENT:     Mouth/Throat:     Mouth:  Mucous membranes are moist.  Cardiovascular:     Rate and Rhythm: Normal rate and regular rhythm.     Heart sounds: No murmur heard.  No friction rub. No gallop.   Pulmonary:     Effort: Pulmonary effort is normal. No respiratory distress.     Breath sounds: Normal breath sounds. No stridor. No wheezing or rhonchi.  Musculoskeletal:     Cervical back: No rigidity or tenderness.  Neurological:     Mental Status: He is alert.  Psychiatric:        Mood and Affect: Mood normal.  Assessment:  Nonrestorative sleep  Daytime somnolence  Multiple comorbidities with medications that may be contributing to his daytime somnolence  Plan/Recommendations: We will not make any changes to his daytime medications at present  For his nighttime difficulty sleeping will switch from Ambien and melatonin to [facility] which has helped him in the past  Optimizing medications that he uses during the day may help reduce daytime sleepiness  Increase activity as tolerated during the day to try and stay awake during the day and consolidate sleep at nighttime  Follow-up in 2 to 3 months  He does not appear to have significant symptoms to be concerned for sleep disordered breathing   Virl Diamond MD [facility] 07/22/2020, 2:29 PM  CC: No ref. provider found

## 2020-07-30 ENCOUNTER — Encounter (HOSPITAL_COMMUNITY): Payer: Self-pay

## 2020-07-30 ENCOUNTER — Emergency Department (HOSPITAL_COMMUNITY): Payer: Medicare PPO

## 2020-07-30 ENCOUNTER — Other Ambulatory Visit: Payer: Self-pay

## 2020-07-30 ENCOUNTER — Emergency Department (HOSPITAL_COMMUNITY)
Admission: EM | Admit: 2020-07-30 | Discharge: 2020-07-31 | Disposition: A | Discharge status: Home / Self Care | Payer: Medicare PPO | Attending: Emergency Medicine | Admitting: Emergency Medicine

## 2020-07-30 DIAGNOSIS — Z20822 Contact with and (suspected) exposure to covid-19: Secondary | ICD-10-CM | POA: Diagnosis not present

## 2020-07-30 DIAGNOSIS — Z79899 Other long term (current) drug therapy: Secondary | ICD-10-CM | POA: Diagnosis not present

## 2020-07-30 DIAGNOSIS — F200 Paranoid schizophrenia: Secondary | ICD-10-CM | POA: Diagnosis present

## 2020-07-30 DIAGNOSIS — R4182 Altered mental status, unspecified: Secondary | ICD-10-CM | POA: Insufficient documentation

## 2020-07-30 DIAGNOSIS — F17210 Nicotine dependence, cigarettes, uncomplicated: Secondary | ICD-10-CM | POA: Insufficient documentation

## 2020-07-30 DIAGNOSIS — R45851 Suicidal ideations: Secondary | ICD-10-CM | POA: Insufficient documentation

## 2020-07-30 DIAGNOSIS — I5022 Chronic systolic (congestive) heart failure: Secondary | ICD-10-CM | POA: Diagnosis not present

## 2020-07-30 DIAGNOSIS — E871 Hypo-osmolality and hyponatremia: Secondary | ICD-10-CM | POA: Diagnosis not present

## 2020-07-30 DIAGNOSIS — F0390 Unspecified dementia without behavioral disturbance: Secondary | ICD-10-CM | POA: Insufficient documentation

## 2020-07-30 DIAGNOSIS — J449 Chronic obstructive pulmonary disease, unspecified: Secondary | ICD-10-CM | POA: Insufficient documentation

## 2020-07-30 DIAGNOSIS — Z008 Encounter for other general examination: Secondary | ICD-10-CM

## 2020-07-30 LAB — CBC WITH DIFFERENTIAL/PLATELET
Abs Immature Granulocytes: 0.1 10*3/uL — ABNORMAL HIGH (ref 0.00–0.07)
Basophils Absolute: 0 10*3/uL (ref 0.0–0.1)
Basophils Relative: 0 %
Eosinophils Absolute: 0.2 10*3/uL (ref 0.0–0.5)
Eosinophils Relative: 2 %
HCT: 38.4 % — ABNORMAL LOW (ref 39.0–52.0)
Hemoglobin: 12.9 g/dL — ABNORMAL LOW (ref 13.0–17.0)
Immature Granulocytes: 1 %
Lymphocytes Relative: 31 %
Lymphs Abs: 3.2 10*3/uL (ref 0.7–4.0)
MCH: 23.8 pg — ABNORMAL LOW (ref 26.0–34.0)
MCHC: 33.6 g/dL (ref 30.0–36.0)
MCV: 70.8 fL — ABNORMAL LOW (ref 80.0–100.0)
Monocytes Absolute: 0.9 10*3/uL (ref 0.1–1.0)
Monocytes Relative: 9 %
Neutro Abs: 5.8 10*3/uL (ref 1.7–7.7)
Neutrophils Relative %: 57 %
Platelets: 254 10*3/uL (ref 150–400)
RBC: 5.42 MIL/uL (ref 4.22–5.81)
RDW: 15.1 % (ref 11.5–15.5)
WBC: 10.3 10*3/uL (ref 4.0–10.5)
nRBC: 0 % (ref 0.0–0.2)

## 2020-07-30 LAB — RAPID URINE DRUG SCREEN, HOSP PERFORMED
Amphetamines: NOT DETECTED
Barbiturates: NOT DETECTED
Benzodiazepines: POSITIVE — AB
Cocaine: NOT DETECTED
Opiates: NOT DETECTED
Tetrahydrocannabinol: NOT DETECTED

## 2020-07-30 LAB — DIGOXIN LEVEL: Digoxin Level: 0.4 ng/mL — ABNORMAL LOW (ref 1.0–2.0)

## 2020-07-30 LAB — PHENYTOIN LEVEL, TOTAL: Phenytoin Lvl: 9.8 ug/mL — ABNORMAL LOW (ref 10.0–20.0)

## 2020-07-30 LAB — SODIUM, URINE, RANDOM: Sodium, Ur: 84 mmol/L

## 2020-07-30 LAB — COMPREHENSIVE METABOLIC PANEL
ALT: 24 U/L (ref 0–44)
AST: 23 U/L (ref 15–41)
Albumin: 4 g/dL (ref 3.5–5.0)
Alkaline Phosphatase: 218 U/L — ABNORMAL HIGH (ref 38–126)
Anion gap: 11 (ref 5–15)
BUN: 5 mg/dL — ABNORMAL LOW (ref 6–20)
CO2: 22 mmol/L (ref 22–32)
Calcium: 8.8 mg/dL — ABNORMAL LOW (ref 8.9–10.3)
Chloride: 93 mmol/L — ABNORMAL LOW (ref 98–111)
Creatinine, Ser: 0.49 mg/dL — ABNORMAL LOW (ref 0.61–1.24)
GFR, Estimated: >60 mL/min (ref >60)
Glucose, Bld: 95 mg/dL (ref 70–99)
Potassium: 3.5 mmol/L (ref 3.5–5.1)
Sodium: 126 mmol/L — ABNORMAL LOW (ref 135–145)
Total Bilirubin: 0.5 mg/dL (ref 0.3–1.2)
Total Protein: 7.3 g/dL (ref 6.5–8.1)

## 2020-07-30 LAB — RESP PANEL BY RT-PCR (FLU A&B, COVID) ARPGX2
Influenza A by PCR: NEGATIVE
Influenza B by PCR: NEGATIVE
SARS Coronavirus 2 by RT PCR: NEGATIVE

## 2020-07-30 LAB — ETHANOL: Alcohol, Ethyl (B): <10 mg/dL (ref <10)

## 2020-07-30 LAB — BRAIN NATRIURETIC PEPTIDE: B Natriuretic Peptide: 40.4 pg/mL (ref 0.0–100.0)

## 2020-07-30 LAB — OSMOLALITY: Osmolality: 266 mOsm/kg — ABNORMAL LOW (ref 275–295)

## 2020-07-30 LAB — OSMOLALITY, URINE: Osmolality, Ur: 433 mOsm/kg (ref 300–900)

## 2020-07-30 MED ORDER — QUETIAPINE FUMARATE 100 MG PO TABS
200.0000 mg | ORAL_TABLET | Freq: Every day | ORAL | Status: DC
  Administered 2020-07-30: 200 mg via ORAL
  Filled 2020-07-30: qty 2

## 2020-07-30 MED ORDER — ACETAMINOPHEN 325 MG PO TABS: 650.0000 mg | ORAL_TABLET | ORAL | Status: DC | PRN

## 2020-07-30 MED ORDER — ONDANSETRON HCL 4 MG PO TABS: 4.0000 mg | ORAL_TABLET | Freq: Three times a day (TID) | ORAL | Status: DC | PRN

## 2020-07-30 MED ORDER — NICOTINE 21 MG/24HR TD PT24
21.0000 mg | MEDICATED_PATCH | Freq: Once | TRANSDERMAL | Status: DC
  Administered 2020-07-30: 21 mg via TRANSDERMAL
  Filled 2020-07-30: qty 1

## 2020-07-30 MED ORDER — LORAZEPAM 1 MG PO TABS
1.0000 mg | ORAL_TABLET | ORAL | Status: AC | PRN
  Administered 2020-07-30: 1 mg via ORAL
  Filled 2020-07-30: qty 1

## 2020-07-30 MED ORDER — ZIPRASIDONE MESYLATE 20 MG IM SOLR: 20.0000 mg | Freq: Two times a day (BID) | INTRAMUSCULAR | Status: DC | PRN

## 2020-07-30 MED ORDER — ALPRAZOLAM 0.5 MG PO TABS
0.5000 mg | ORAL_TABLET | Freq: Two times a day (BID) | ORAL | Status: DC
  Administered 2020-07-30 (×2): 0.5 mg via ORAL
  Filled 2020-07-30 (×2): qty 1

## 2020-07-30 MED ORDER — LORAZEPAM 1 MG PO TABS
2.0000 mg | ORAL_TABLET | Freq: Once | ORAL | Status: AC
  Administered 2020-07-30: 2 mg via ORAL
  Filled 2020-07-30: qty 2

## 2020-07-30 MED ORDER — ZOLPIDEM TARTRATE 5 MG PO TABS: 5.0000 mg | ORAL_TABLET | Freq: Every evening | ORAL | Status: DC | PRN

## 2020-07-30 NOTE — ED Provider Notes (Addendum)
  Physical Exam  BP (!) 162/105   Pulse 100   Temp 97.8 F (36.6 C) (Oral)   Resp 18   SpO2 96%   Physical Exam  ED Course/Procedures     Procedures  MDM     Assuming care of patient from Dr. Bebe Shaggy.   Patient in the ED for suicidal ideation and paranoia.  He has history of psychosis, COPD and CHF.  Patient resides at a nursing facility. Results of the work-up are pending.  Patient does have mild hyponatremia.  Plan is to follow-up on the labs. Dr. Bebe Shaggy has noted that patient has hyponatremia.  He is unsure if it is contributing to patient's condition.  I suspect that the hyponatremia is secondary to his CHF.  I will discuss the case with medicine team once the results return in entirety to see if patient needs medicine consultation.  At the moment, I suspect the hyponatremia is because of compensated CHF and he has had the same level sodium in the past -patient is medically cleared for psych evaluation.  Patient had no complains, no concerns from the nursing side. Will continue to monitor.   Derwood Kaplan, MD 07/30/20 0847   10:05 AM I discussed case with Dr. Lauro Regulus, hospitalist. He informs me that patient's hyponatremia is not new, it has been present for over a year and is most likely a chronic issue related to CHF.  I discussed with him our concerns that it could be impacting him organically, and therefore the consult.  At this time he requested we get urine and serum awesome's and urine sodium.  They will get involved if needed based on the results.   Derwood Kaplan, MD 07/30/20 1020

## 2020-07-30 NOTE — ED Triage Notes (Signed)
Pt arrived via [facility] from [facility] facility. Pt reports having thoughts of wanting to hurt himself. Pt says he has a plan to jump in front of a car to kill himself. Patient also states he has trouble sleeping due to these thoughts.   Vitals from EMS:  BP: 140/90 HR: 110 RR: 16 O2: 97% RA CBG: 108 Temp: 97.8

## 2020-07-30 NOTE — ED Provider Notes (Addendum)
Medical Decision Making: Care of patient assumed from Dr. Rhunette Croft at 1530.  Agree with history, physical exam and plan.  See their note for further details.  Briefly, The pt p/w suicidal ideation paranoia.  History of hyponatremia.  Unclear if hyponatremia is contributing, appears to have chronic issues with this per chart review.  Awaiting serum and urine awesome's for medicine consult for management of hyponatremia..   Current plan is as follows: labs and medicine consult  Based on the medicine consult and labs performed and the results of those labs, the medicine attending, Dr. Rachael Darby, feels this is a chronic condition that does not warrant any acute intervention, and they would consider him medically cleared for psychiatric evaluation.  I personally reviewed and interpreted all labs/imaging.       Jim Donovan, MD 07/30/20 [phone]

## 2020-07-30 NOTE — ED Notes (Signed)
Pt states he is ready to go home. He is agitated because he is not yet up for discharge. Pt agreed to sit in his room if I gave him ativan to help him rest until his discharge is ready.

## 2020-07-30 NOTE — ED Notes (Signed)
Pt given a [place] sandwich and drink. Pt asked for cigarette. NT informed he could only have a nicotine patch. Pt requested a nicotine patch.

## 2020-07-30 NOTE — Consult Note (Addendum)
Telepsych Consultation   Reason for Consult: Psychiatric Evaluation Referring Physician: Lucien Mons, EDP Location of Patient:  Location of Provider: Other: [facility] ED  Patient Identification: Jim Dunn MRN:  [id number] Principal Diagnosis: <principal problem not specified> Diagnosis:  Active Problems:   Chronic hyponatremia   Paranoid schizophrenia (HCC)   Total Time spent with patient: 20 minutes  Subjective: "I feel fine. I am safe at home."  Jim Dunn is a 54 y.o. male patient presented to [facility] as a consult to have the patient seen by a psychiatric provider. The patient with a past medical history of paranoid schizophrenia, seizures, tobacco abuse. The patient was seen via Telepsych by this provider; the chart was reviewed on 07/30/2020 due to the patient's care. The patient is in pajama bottoms without any shirt on, alert and oriented x4. The patient speaks in a clear tone, at moderate volume, and a normal pace. Motor behavior appears normal. Eye contact is good. The patient's mood is euthymic, and his affect is congruent with mood. The thought process is coherent and relevant. There is no indication the patient is currently not responding to internal or external stimuli or experiencing delusional thought content. The patient was cooperative throughout the assessment. He is requesting to be discharged home.  The patient denies any suicidal, homicidal, or self-harm ideations. The patient is not presenting with any psychotic or paranoid behaviors. During an encounter with the patient, he was able to answer questions appropriately.  HPI: Per Dr. Dairl Ponder; This is a 54 y.o. 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  ED physician spoke to the patient's mother by phone and reported this behavior typically indicates that he has hyponatremia, prompting internal medicine  consult.  The patient is a poor historian and difficult to understand as he is mumbling. According to prior notes, the patient stated that 'something is in my brain.' He did not clarify this on exam.   Past Psychiatric History:   Risk to Self: Suicidal Ideation: Yes-Currently Present Suicidal Intent: No Is patient at risk for suicide?: Yes Suicidal Plan?: No Access to Means: No What has been your use of drugs/alcohol within the last 12 months?:  (Denies) How many times?: 0 Other Self Harm Risks:  (NA) Triggers for Past Attempts: Unknown Intentional Self Injurious Behavior: None Risk to Others: Homicidal Ideation: No Thoughts of Harm to Others: No Current Homicidal Intent: No Current Homicidal Plan: No Access to Homicidal Means: No Identified Victim: NA History of harm to others?: No Assessment of Violence: None Noted Violent Behavior Description: NA Does patient have access to weapons?: No Criminal Charges Pending?: No Does patient have a court date: No Prior Inpatient Therapy: Prior Inpatient Therapy: No Prior Outpatient Therapy: Prior Outpatient Therapy: Yes Prior Therapy Dates: Ongoing Prior Therapy Facilty/Provider(s): ALF Reason for Treatment: Med mang Does patient have an ACCT team?: No Does patient have Intensive In-House Services?  : No Does patient have [facility] services? : No Does patient have P4CC services?: No  Past Medical History:  Past Medical History:  Diagnosis Date  . CHF (congestive heart failure) (HCC)   . COPD (chronic obstructive pulmonary disease) (HCC)   . History of kidney stones   . Paranoid schizophrenia (HCC)   . Seizures (HCC)   . Tobacco abuse     Past Surgical History:  Procedure Laterality Date  . KIDNEY STONE SURGERY    . MULTIPLE EXTRACTIONS WITH ALVEOLOPLASTY  Bilateral 04/29/2020   Procedure: Extraction of tooth #'s 2-13, 17,18, 21-25, and 27-31 with alveoloplasty and bilateral lingual exostoses reductions.;  Surgeon: [name], DDS;  Location: [facility] OR;  Service: Oral Surgery;  Laterality: Bilateral;   Family History:  Family History  Problem Relation Age of Onset  . Heart disease Other    Family Psychiatric  History:  Social History:  Social History   Substance and Sexual Activity  Alcohol Use Not Currently     Social History   Substance and Sexual Activity  Drug Use Never    Social History   Socioeconomic History  . Marital status: Single    Spouse name: Not on file  . Number of children: Not on file  . Years of education: Not on file  . Highest education level: Not on file  Occupational History  . Occupation: Disability  Tobacco Use  . Smoking status: Current Every Day Smoker    Packs/day: 1.00    Types: Cigarettes  . Smokeless tobacco: Current User    Types: Chew  . Tobacco comment: 2-3 cigarettes smoked daily 07/22/20 [facility]   Vaping Use  . Vaping Use: Never used  Substance and Sexual Activity  . Alcohol use: Not Currently  . Drug use: Never  . Sexual activity: Not Currently  Other Topics Concern  . Not on file  Social History Narrative   Pt lives [facility].  He receives outpatient psychiatric services through the ALF.  He stated that prior to moving there, he did not have a psychiatrist.     Social Determinants of Health   Financial Resource Strain:   . Difficulty of Paying Living Expenses: Not on file  Food Insecurity:   . Worried About Programme researcher, broadcasting/film/video in the Last Year: Not on file  . Ran Out of Food in the Last Year: Not on file  Transportation Needs:   . Lack of Transportation (Medical): Not on file  . Lack of Transportation (Non-Medical): Not on file  Physical Activity:   . Days of Exercise per Week: Not on file  . Minutes of Exercise per Session: Not on file  Stress:   . Feeling of Stress : Not on file  Social Connections:   . Frequency of Communication with Friends and Family: Not on file  . Frequency of Social Gatherings with Friends and Family:  Not on file  . Attends Religious Services: Not on file  . Active Member of Clubs or Organizations: Not on file  . Attends Banker Meetings: Not on file  . Marital Status: Not on file   Additional Social History:    Allergies:  No Known Allergies  Labs:  Results for orders placed or performed during the hospital encounter of 07/30/20 (from the past 48 hour(s))  Resp Panel by RT-PCR (Flu A&B, Covid) Nasopharyngeal Swab     Status: None   Collection Time: 07/30/20  6:03 AM   Specimen: Nasopharyngeal Swab; Nasopharyngeal(NP) swabs in vial transport medium  Result Value Ref Range   SARS Coronavirus 2 by RT PCR NEGATIVE NEGATIVE    Comment: (NOTE) SARS-CoV-2 target nucleic acids are NOT DETECTED.  The SARS-CoV-2 RNA is generally detectable in upper respiratory specimens during the acute phase of infection. The lowest concentration of SARS-CoV-2 viral copies this assay can detect is 138 copies/mL. A negative result does not preclude SARS-Cov-2 infection and should not be used as the sole basis for treatment or other patient management decisions. A negative result  may occur with  improper specimen collection/handling, submission of specimen other than nasopharyngeal swab, presence of viral mutation(s) within the areas targeted by this assay, and inadequate number of viral copies(<138 copies/mL). A negative result must be combined with clinical observations, patient history, and epidemiological information. The expected result is Negative.  Fact Sheet for Patients:  BloggerCourse.com  Fact Sheet for Healthcare Providers:  SeriousBroker.it  This test is no t yet approved or cleared by the [place] FDA and  has been authorized for detection and/or diagnosis of SARS-CoV-2 by FDA under an Emergency Use Authorization (EUA). This EUA will remain  in effect (meaning this test can be used) for the duration of the COVID-19  declaration under Section 564(b)(1) of the Act, 21 U.S.C.section 360bbb-3(b)(1), unless the authorization is terminated  or revoked sooner.       Influenza A by PCR NEGATIVE NEGATIVE   Influenza B by PCR NEGATIVE NEGATIVE    Comment: (NOTE) The Xpert Xpress SARS-CoV-2/FLU/RSV plus assay is intended as an aid in the diagnosis of influenza from Nasopharyngeal swab specimens and should not be used as a sole basis for treatment. Nasal washings and aspirates are unacceptable for Xpert Xpress SARS-CoV-2/FLU/RSV testing.  Fact Sheet for Patients: BloggerCourse.com  Fact Sheet for Healthcare Providers: SeriousBroker.it  This test is not yet approved or cleared by the [place] FDA and has been authorized for detection and/or diagnosis of SARS-CoV-2 by FDA under an Emergency Use Authorization (EUA). This EUA will remain in effect (meaning this test can be used) for the duration of the COVID-19 declaration under Section 564(b)(1) of the Act, 21 U.S.C. section 360bbb-3(b)(1), unless the authorization is terminated or revoked.  Performed at [facility], [address], [place], [place] [address]   CBC with Differential/Platelet     Status: Abnormal   Collection Time: 07/30/20  6:14 AM  Result Value Ref Range   WBC 10.3 4.0 - 10.5 K/uL   RBC 5.42 4.22 - 5.81 MIL/uL   Hemoglobin 12.9 (L) 13.0 - 17.0 g/dL   HCT 60.4 (L) 39 - 52 %   MCV 70.8 (L) 80.0 - 100.0 fL   MCH 23.8 (L) 26.0 - 34.0 pg   MCHC 33.6 30.0 - 36.0 g/dL   RDW 54.0 98.1 - 19.1 %   Platelets 254 150 - 400 K/uL   nRBC 0.0 0.0 - 0.2 %   Neutrophils Relative % 57 %   Neutro Abs 5.8 1.7 - 7.7 K/uL   Lymphocytes Relative 31 %   Lymphs Abs 3.2 0.7 - 4.0 K/uL   Monocytes Relative 9 %   Monocytes Absolute 0.9 0.1 - 1.0 K/uL   Eosinophils Relative 2 %   Eosinophils Absolute 0.2 0.0 - 0.5 K/uL   Basophils Relative 0 %   Basophils Absolute 0.0 0.0 - 0.1  K/uL   Immature Granulocytes 1 %   Abs Immature Granulocytes 0.10 (H) 0.00 - 0.07 K/uL    Comment: Performed at [facility], [address] [place], [place] [address]  Comprehensive metabolic panel     Status: Abnormal   Collection Time: 07/30/20  6:14 AM  Result Value Ref Range   Sodium 126 (L) 135 - 145 mmol/L   Potassium 3.5 3.5 - 5.1 mmol/L   Chloride 93 (L) 98 - 111 mmol/L   CO2 22 22 - 32 mmol/L   Glucose, Bld 95 70 - 99 mg/dL    Comment: Glucose reference range applies only to samples taken after fasting  for at least 8 hours.   BUN 5 (L) 6 - 20 mg/dL   Creatinine, Ser 5.27 (L) 0.61 - 1.24 mg/dL   Calcium 8.8 (L) 8.9 - 10.3 mg/dL   Total Protein 7.3 6.5 - 8.1 g/dL   Albumin 4.0 3.5 - 5.0 g/dL   AST 23 15 - 41 U/L   ALT 24 0 - 44 U/L   Alkaline Phosphatase 218 (H) 38 - 126 U/L   Total Bilirubin 0.5 0.3 - 1.2 mg/dL   GFR, Estimated >78 >24 mL/min    Comment: (NOTE) Calculated using the CKD-EPI Creatinine Equation (2021)    Anion gap 11 5 - 15    Comment: Performed at [facility], [address] [place], [place] [address]  Brain natriuretic peptide     Status: None   Collection Time: 07/30/20  6:14 AM  Result Value Ref Range   B Natriuretic Peptide 40.4 0.0 - 100.0 pg/mL    Comment: Performed at [facility], [address], [place], [place] [address]  Phenytoin level, total     Status: Abnormal   Collection Time: 07/30/20  6:14 AM  Result Value Ref Range   Phenytoin Lvl 9.8 (L) 10.0 - 20.0 ug/mL    Comment: Performed at [facility], [address], [place], [place] [address]  Ethanol     Status: None   Collection Time: 07/30/20  6:26 AM  Result Value Ref Range   Alcohol, Ethyl (B) <10 <10 mg/dL    Comment: (NOTE) Lowest detectable limit for serum alcohol is 10 mg/dL.  For medical purposes only. Performed at [facility], [address] [place], [place] [address]    Digoxin level     Status: Abnormal   Collection Time: 07/30/20  7:22 AM  Result Value Ref Range   Digoxin Level 0.4 (L) 1.0 - 2.0 ng/mL    Comment: Performed at [facility], [address], [place], [place] [address]  Osmolality     Status: Abnormal   Collection Time: 07/30/20  7:22 AM  Result Value Ref Range   Osmolality 266 (L) 275 - 295 mOsm/kg    Comment: Performed at [facility], [address] [place], [place] [address]  Rapid urine drug screen (hospital performed)     Status: Abnormal   Collection Time: 07/30/20  4:40 PM  Result Value Ref Range   Opiates NONE DETECTED NONE DETECTED   Cocaine NONE DETECTED NONE DETECTED   Benzodiazepines POSITIVE (A) NONE DETECTED   Amphetamines NONE DETECTED NONE DETECTED   Tetrahydrocannabinol NONE DETECTED NONE DETECTED   Barbiturates NONE DETECTED NONE DETECTED    Comment: (NOTE) DRUG SCREEN FOR MEDICAL PURPOSES ONLY.  IF CONFIRMATION IS NEEDED FOR ANY PURPOSE, NOTIFY LAB WITHIN 5 DAYS.  LOWEST DETECTABLE LIMITS FOR URINE DRUG SCREEN Drug Class                     Cutoff (ng/mL) Amphetamine and metabolites    1000 Barbiturate and metabolites    200 Benzodiazepine                 200 Tricyclics and metabolites     300 Opiates and metabolites        300 Cocaine and metabolites        300 THC  50 Performed at [facility], [address] [place], [place] [address]   Osmolality, urine     Status: None   Collection Time: 07/30/20  4:40 PM  Result Value Ref Range   Osmolality, Ur 433 300 - 900 mOsm/kg    Comment: Performed at [facility], [address] [place], [place] [address]  Sodium, urine, random     Status: None   Collection Time: 07/30/20  4:40 PM  Result Value Ref Range   Sodium, Ur 84 mmol/L    Comment: Performed at [facility], [address], [place], [place] [address]    Medications:  Current Facility-Administered  Medications  Medication Dose Route Frequency Provider Last Rate Last Admin  . acetaminophen (TYLENOL) tablet 650 mg  650 mg Oral Q4H PRN Derwood Kaplan, MD      . ALPRAZolam Prudy Feeler) tablet 0.5 mg  0.5 mg Oral BID Zadie Rhine, MD   0.5 mg at 07/30/20 2130  . ziprasidone (GEODON) injection 20 mg  20 mg Intramuscular Q12H PRN Derwood Kaplan, MD       And  . LORazepam (ATIVAN) tablet 1 mg  1 mg Oral PRN Nanavati, Ankit, MD      . nicotine (NICODERM CQ - dosed in mg/24 hours) patch 21 mg  21 mg Transdermal Once Sabino Donovan, MD   21 mg at 07/30/20 2129  . ondansetron (ZOFRAN) tablet 4 mg  4 mg Oral Q8H PRN Rhunette Croft, Ankit, MD      . QUEtiapine (SEROQUEL) tablet 200 mg  200 mg Oral Daily Zadie Rhine, MD   200 mg at 07/30/20 0644  . zolpidem (AMBIEN) tablet 5 mg  5 mg Oral QHS PRN Derwood Kaplan, MD       Current Outpatient Medications  Medication Sig Dispense Refill  . acetaminophen (TYLENOL) 325 MG tablet Take 2 tablets (650 mg total) by mouth every 4 (four) hours as needed for mild pain or fever.    . ALPRAZolam (XANAX) 0.5 MG tablet Take 1 tablet (0.5 mg total) by mouth 2 (two) times daily. 30 tablet 0  . atorvastatin (LIPITOR) 40 MG tablet Take 1 tablet (40 mg total) by mouth daily. (Patient taking differently: Take 40 mg by mouth at bedtime. ) 30 tablet 2  . busPIRone (BUSPAR) 10 MG tablet Take 1 tablet (10 mg total) by mouth 3 (three) times daily. 90 tablet 2  . digoxin (LANOXIN) 0.25 MG tablet Take 0.25 mg by mouth daily.    . fluticasone furoate-vilanterol (BREO ELLIPTA) 200-25 MCG/INH AEPB Inhale 1 puff into the lungs daily. 1 each 2  . furosemide (LASIX) 40 MG tablet Take 1 tablet (40 mg total) by mouth daily. 30 tablet 2  . INVEGA SUSTENNA 234 MG/1.5ML SUSY injection Inject 234 mg into the muscle every 30 (thirty) days. 1.8 mL 2  . Ipratropium-Albuterol (COMBIVENT RESPIMAT) 20-100 MCG/ACT AERS respimat Inhale 1 puff into the lungs every 6 (six) hours as needed for wheezing or  shortness of breath.    . magnesium oxide (MAG-OX) 400 (241.3 Mg) MG tablet Take 1 tablet (400 mg total) by mouth daily. 30 tablet 2  . Melatonin 3 MG SUBL Place 3 mg under the tongue at bedtime as needed (for sleep- use before Ambien).    . phenytoin (DILANTIN) 100 MG ER capsule Take 300 mg by mouth in the morning and at bedtime.    . pregabalin (LYRICA) 75 MG capsule Take 1 capsule (75 mg total) by mouth  3 (three) times daily. 90 capsule 2  . QUEtiapine (SEROQUEL) 200 MG tablet Take 1 tablet (200 mg total) by mouth daily. 30 tablet 2  . sacubitril-valsartan (ENTRESTO) 24-26 MG Take 1 tablet by mouth 2 (two) times daily. 60 tablet 2  . zolpidem (AMBIEN) 10 MG tablet Take 10 mg by mouth at bedtime as needed for sleep (IF MELATONIN IS NOT EFFECTIVE).    [name] digoxin (LANOXIN) 0.125 MG tablet Take 1 tablet (0.125 mg total) by mouth daily. (Patient not taking: Reported on 07/30/2020) 30 tablet 2  . eszopiclone (LUNESTA) 2 MG TABS tablet Take 1 tablet (2 mg total) by mouth at bedtime as needed for sleep. Take immediately before bedtime 30 tablet 2  . Ipratropium-Albuterol (COMBIVENT) 20-100 MCG/ACT AERS respimat Inhale 1 puff into the lungs every 6 (six) hours as needed for wheezing or shortness of breath. (Patient not taking: Reported on 07/30/2020) 1 each 2  . phenytoin (DILANTIN) 300 MG ER capsule Take 1 capsule (300 mg total) by mouth 2 (two) times daily. (Patient not taking: Reported on 07/30/2020) 60 capsule 2    Musculoskeletal: Strength & Muscle Tone: within normal limits Gait & Station: normal Patient leans: N/A  Psychiatric Specialty Exam: Physical Exam Vitals and nursing note reviewed.  Constitutional:      Appearance: Normal appearance. He is obese.  HENT:     Right Ear: External ear normal.     Left Ear: External ear normal.     Nose: Nose normal.     Mouth/Throat:     Mouth: Mucous membranes are moist.  Cardiovascular:     Rate and Rhythm: Normal rate.  Pulmonary:     Effort:  Pulmonary effort is normal.  Musculoskeletal:        General: Normal range of motion.     Cervical back: Normal range of motion and neck supple.  Neurological:     General: No focal deficit present.     Mental Status: He is alert and oriented to person, place, and time.  Psychiatric:        Attention and Perception: Attention and perception normal.        Mood and Affect: Mood and affect normal.        Speech: Speech normal.        Behavior: Behavior normal. Behavior is cooperative.        Thought Content: Thought content normal.        Cognition and Memory: Cognition and memory normal.        Judgment: Judgment normal.     Review of Systems  All other systems reviewed and are negative.   Blood pressure (!) 170/102, pulse 96, temperature 98 F (36.7 C), temperature source Oral, resp. rate (!) 22, SpO2 99 %.There is no height or weight on file to calculate BMI.  General Appearance: Casual  Eye Contact:  Good  Speech:  Clear and Coherent  Volume:  Normal  Mood:  Anxious and Euthymic  Affect:  Appropriate  Thought Process:  Coherent  Orientation:  Full (Time, Place, and Person)  Thought Content:  Logical  Suicidal Thoughts:  No  Homicidal Thoughts:  No  Memory:  Immediate;   Good Recent;   Good Remote;   Good  Judgement:  Good  Insight:  Good  Psychomotor Activity:  Normal  Concentration:  Concentration: Good  Recall:  Good  Fund of Knowledge:  Good  Language:  Good  Akathisia:  Negative  Handed:  Right  AIMS (if indicated):  Assets:  Communication Skills Desire for Improvement Physical Health Resilience Social Support  ADL's:  Intact  Cognition:  WNL  Sleep:    Well     Treatment Plan Summary: Medication management and Plan The patient is not a safety risk to himself or others and does not require psychiatric inpatient admission for stabilization and treatment.    Disposition: No evidence of imminent risk to self or others at present.   Patient does not  meet criteria for psychiatric inpatient admission. Supportive therapy provided about ongoing stressors. Discussed crisis plan, support from social network, calling 911, coming to the Emergency Department, and calling Suicide Hotline.  Patient psychiatrically cleared  This service was provided via telemedicine using a 2-way, interactive audio and video technology.  Names of all persons participating in this telemedicine service and their role in this encounter. Name: Gillermo Murdoch Role: PMHNP-BC  Name:  Role:   Name:  Role:   Name:  Role:     Gillermo Murdoch, NP 07/30/2020 11:42 PM

## 2020-07-30 NOTE — ED Notes (Signed)
Pt wants to leave to smoke a cigarette.

## 2020-07-30 NOTE — Consult Note (Signed)
Medical Consultation   Jim Dunn  FYB:[id number]  DOB: 1966-08-18  DOA: 07/30/2020  PCP: Oneita Hurt, No    Requesting physician: Dr. Bebe Shaggy  Reason for consultation: Hyponatremia   History of Present Illness: This is a 54 y.o. 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  ED physician spoke to the patient's mother by phone and reported this behavior typically indicates that he has hyponatremia, prompting internal medicine consult.  The patient is a poor historian and difficult to understand as he is mumbling. According to prior notes, the patient stated that 'something is in my brain.' He did not clarify this on exam.    Review of Systems:  Review of Systems  Respiratory: Negative for shortness of breath.   Cardiovascular: Negative for chest pain.  All other systems reviewed and are negative.  As per HPI otherwise 10 point review of systems negative.     Past Medical History: Past Medical History:  Diagnosis Date  . CHF (congestive heart failure) (HCC)   . COPD (chronic obstructive pulmonary disease) (HCC)   . History of kidney stones   . Paranoid schizophrenia (HCC)   . Seizures (HCC)   . Tobacco abuse     Past Surgical History: Past Surgical History:  Procedure Laterality Date  . KIDNEY STONE SURGERY    . MULTIPLE EXTRACTIONS WITH ALVEOLOPLASTY Bilateral 04/29/2020   Procedure: Extraction of tooth #'s 2-13, 17,18, 21-25, and 27-31 with alveoloplasty and bilateral lingual exostoses reductions.;  Surgeon: [name], DDS;  Location: [facility] OR;  Service: Oral Surgery;  Laterality: Bilateral;     Allergies:  No Known Allergies   Social History:  reports that he has been smoking cigarettes. He has been smoking about 1.00 pack per day. His smokeless tobacco use includes chew. He reports previous alcohol use. He  reports that he does not use drugs.   Family History: Family History  Problem Relation Age of Onset  . Heart disease Other      Physical Exam: Vitals:   07/30/20 0529 07/30/20 0908 07/30/20 0945  BP: (!) 162/105 (!) 157/100 135/89  Pulse: 100 89 88  Resp: 18 14 20   Temp: 97.8 F (36.6 C) 97.8 F (36.6 C)   TempSrc: Oral Oral   SpO2: 96% 100% 100%    Physical Exam Vitals and nursing note reviewed.  Constitutional:      General: He is not in acute distress.    Appearance: He is not ill-appearing.  HENT:     Head: Normocephalic.  Eyes:     Conjunctiva/sclera: Conjunctivae normal.  Cardiovascular:     Rate and Rhythm: Normal rate and regular rhythm.  Pulmonary:     Effort: Pulmonary effort is normal.     Breath sounds: Normal breath sounds.  Abdominal:     General: Abdomen is flat. There is no distension.  Musculoskeletal:        General: No swelling or tenderness.  Neurological:     Mental Status: He is alert and oriented to person, place, and time.  Psychiatric:        Mood and Affect: Mood is not anxious.        Behavior: Behavior is not aggressive.     Data reviewed:  I have personally reviewed following labs and imaging studies Labs:  CBC: Recent Labs  Lab  07/30/20 0614  WBC 10.3  NEUTROABS 5.8  HGB 12.9*  HCT 38.4*  MCV 70.8*  PLT 254    Basic Metabolic Panel: Recent Labs  Lab 07/30/20 0614  NA 126*  K 3.5  CL 93*  CO2 22  GLUCOSE 95  BUN 5*  CREATININE 0.49*  CALCIUM 8.8*   GFR Estimated Creatinine Clearance: 122.7 mL/min (A) (by C-G formula based on SCr of 0.49 mg/dL (L)). Liver Function Tests: Recent Labs  Lab 07/30/20 0614  AST 23  ALT 24  ALKPHOS 218*  BILITOT 0.5  PROT 7.3  ALBUMIN 4.0   No results for input(s): LIPASE, AMYLASE in the last 168 hours. No results for input(s): AMMONIA in the last 168 hours. Coagulation profile No results for input(s): INR, PROTIME in the last 168 hours.  Cardiac Enzymes: No results  for input(s): CKTOTAL, CKMB, CKMBINDEX, TROPONINI in the last 168 hours. BNP: Invalid input(s): POCBNP CBG: No results for input(s): GLUCAP in the last 168 hours. D-Dimer No results for input(s): DDIMER in the last 72 hours. Hgb A1c No results for input(s): HGBA1C in the last 72 hours. Lipid Profile No results for input(s): CHOL, HDL, LDLCALC, TRIG, CHOLHDL, LDLDIRECT in the last 72 hours. Thyroid function studies No results for input(s): TSH, T4TOTAL, T3FREE, THYROIDAB in the last 72 hours.  Invalid input(s): FREET3 Anemia work up No results for input(s): VITAMINB12, FOLATE, FERRITIN, TIBC, IRON, RETICCTPCT in the last 72 hours. Urinalysis    Component Value Date/Time   COLORURINE YELLOW 02/23/2020 1910   APPEARANCEUR CLEAR 02/23/2020 1910   LABSPEC 1.006 02/23/2020 1910   PHURINE 7.0 02/23/2020 1910   GLUCOSEU NEGATIVE 02/23/2020 1910   HGBUR NEGATIVE 02/23/2020 1910   BILIRUBINUR NEGATIVE 02/23/2020 1910   KETONESUR NEGATIVE 02/23/2020 1910   PROTEINUR NEGATIVE 02/23/2020 1910   NITRITE NEGATIVE 02/23/2020 1910   LEUKOCYTESUR NEGATIVE 02/23/2020 1910     Microbiology Recent Results (from the past 240 hour(s))  Respiratory Panel by RT PCR (Flu A&B, Covid) - Nasopharyngeal Swab     Status: None   Collection Time: 07/21/20  6:10 AM   Specimen: Nasopharyngeal Swab  Result Value Ref Range Status   SARS Coronavirus 2 by RT PCR NEGATIVE NEGATIVE Final    Comment: (NOTE) SARS-CoV-2 target nucleic acids are NOT DETECTED.  The SARS-CoV-2 RNA is generally detectable in upper respiratoy specimens during the acute phase of infection. The lowest concentration of SARS-CoV-2 viral copies this assay can detect is 131 copies/mL. A negative result does not preclude SARS-Cov-2 infection and should not be used as the sole basis for treatment or other patient management decisions. A negative result may occur with  improper specimen collection/handling, submission of specimen  other than nasopharyngeal swab, presence of viral mutation(s) within the areas targeted by this assay, and inadequate number of viral copies (<131 copies/mL). A negative result must be combined with clinical observations, patient history, and epidemiological information. The expected result is Negative.  Fact Sheet for Patients:  https://www.moore.com/  Fact Sheet for Healthcare Providers:  https://www.young.biz/  This test is no t yet approved or cleared by the [place] FDA and  has been authorized for detection and/or diagnosis of SARS-CoV-2 by FDA under an Emergency Use Authorization (EUA). This EUA will remain  in effect (meaning this test can be used) for the duration of the COVID-19 declaration under Section 564(b)(1) of the Act, 21 U.S.C. section 360bbb-3(b)(1), unless the authorization is terminated or revoked sooner.     Influenza A by PCR NEGATIVE  NEGATIVE Final   Influenza B by PCR NEGATIVE NEGATIVE Final    Comment: (NOTE) The Xpert Xpress SARS-CoV-2/FLU/RSV assay is intended as an aid in  the diagnosis of influenza from Nasopharyngeal swab specimens and  should not be used as a sole basis for treatment. Nasal washings and  aspirates are unacceptable for Xpert Xpress SARS-CoV-2/FLU/RSV  testing.  Fact Sheet for Patients: https://www.moore.com/  Fact Sheet for Healthcare Providers: https://www.young.biz/  This test is not yet approved or cleared by the [place] FDA and  has been authorized for detection and/or diagnosis of SARS-CoV-2 by  FDA under an Emergency Use Authorization (EUA). This EUA will remain  in effect (meaning this test can be used) for the duration of the  Covid-19 declaration under Section 564(b)(1) of the Act, 21  U.S.C. section 360bbb-3(b)(1), unless the authorization is  terminated or revoked. Performed at [facility], [address], [place], [place] [address]   Resp Panel by RT-PCR (Flu A&B, Covid) Nasopharyngeal Swab     Status: None   Collection Time: 07/30/20  6:03 AM   Specimen: Nasopharyngeal Swab; Nasopharyngeal(NP) swabs in vial transport medium  Result Value Ref Range Status   SARS Coronavirus 2 by RT PCR NEGATIVE NEGATIVE Final    Comment: (NOTE) SARS-CoV-2 target nucleic acids are NOT DETECTED.  The SARS-CoV-2 RNA is generally detectable in upper respiratory specimens during the acute phase of infection. The lowest concentration of SARS-CoV-2 viral copies this assay can detect is 138 copies/mL. A negative result does not preclude SARS-Cov-2 infection and should not be used as the sole basis for treatment or other patient management decisions. A negative result may occur with  improper specimen collection/handling, submission of specimen other than nasopharyngeal swab, presence of viral mutation(s) within the areas targeted by this assay, and inadequate number of viral copies(<138 copies/mL). A negative result must be combined with clinical observations, patient history, and epidemiological information. The expected result is Negative.  Fact Sheet for Patients:  BloggerCourse.com  Fact Sheet for Healthcare Providers:  SeriousBroker.it  This test is no t yet approved or cleared by the [place] FDA and  has been authorized for detection and/or diagnosis of SARS-CoV-2 by FDA under an Emergency Use Authorization (EUA). This EUA will remain  in effect (meaning this test can be used) for the duration of the COVID-19 declaration under Section 564(b)(1) of the Act, 21 U.S.C.section 360bbb-3(b)(1), unless the authorization is terminated  or revoked sooner.       Influenza A by PCR NEGATIVE NEGATIVE Final   Influenza B by PCR NEGATIVE NEGATIVE Final    Comment: (NOTE) The Xpert Xpress SARS-CoV-2/FLU/RSV plus assay is intended as an aid in the  diagnosis of influenza from Nasopharyngeal swab specimens and should not be used as a sole basis for treatment. Nasal washings and aspirates are unacceptable for Xpert Xpress SARS-CoV-2/FLU/RSV testing.  Fact Sheet for Patients: BloggerCourse.com  Fact Sheet for Healthcare Providers: SeriousBroker.it  This test is not yet approved or cleared by the [place] FDA and has been authorized for detection and/or diagnosis of SARS-CoV-2 by FDA under an Emergency Use Authorization (EUA). This EUA will remain in effect (meaning this test can be used) for the duration of the COVID-19 declaration under Section 564(b)(1) of the Act, 21 U.S.C. section 360bbb-3(b)(1), unless the authorization is terminated or revoked.  Performed at [facility], [address], [place], [place] [address]        Inpatient Medications:   Scheduled  Meds: . ALPRAZolam  0.5 mg Oral BID  . QUEtiapine  200 mg Oral Daily   Continuous Infusions:   Radiological Exams on Admission: DG Chest Port 1 View  Result Date: 07/30/2020 CLINICAL DATA:  Weakness and confusion EXAM: PORTABLE CHEST 1 VIEW COMPARISON:  02/23/2020 FINDINGS: Shallow inspiration. Mild cardiac enlargement. Suggestion of developing perihilar hazy infiltrates probably representing edema. Multifocal pneumonia less likely. No pleural effusions. No pneumothorax. IMPRESSION: Cardiac enlargement with suggestion of developing perihilar edema. Electronically Signed   By: Burman Nieves M.D.   On: 07/30/2020 06:04    Impression/Recommendations Active Problems:   Chronic hyponatremia   Paranoid schizophrenia (HCC)    1. Chronic hyponatremia of unknown etiology a. Could be chronic CHF vs. SIADH (has a history of SIADH and on multiple psych meds) b. Certainly could be contributing to his chronic issues c. Sodium appears around his baseline d. Check Serum Osm, Urine Osm, Urine  Sodium  e. Treatment options pending sodium studies but he does not require urgent/emergent reversal at this point  2. Suicidal Ideation/Schizophrenia a. Per primary team   Thank you for this consultation.  Our [facility] hospitalist team will follow the patient with you.   Time Spent: 50 minutes  Jae Dire D.O.  [facility] 07/30/2020, 1:40 PM

## 2020-07-30 NOTE — ED Notes (Signed)
Pt got up and asked for a cigarette. Pt is requesting that someone walk him outside and is asking why he has to stay. Pt redirected to room. NT informed a nicotine patch could be requested. Pt stated he wanted a cigarette.

## 2020-07-30 NOTE — ED Notes (Signed)
Pt requested phone. Tech got phone for pt and informed him that he can use the phone until 9 when the phones cut off.

## 2020-07-30 NOTE — ED Provider Notes (Signed)
[facility]ENCY [facility]PT Provider Note   CSN: [id number] Arrival date & time: 07/30/20  3532     History Chief Complaint - suicidal ideation  LEVEL 5 CAVEAT DUE TO PSYCHIATRIC CONDITION  Jim Dunn is a 54 y.o. male.  The history is provided by the patient and the nursing home.  Mental Health Problem Degree of incapacity (severity):  Moderate Onset quality:  Sudden Timing:  Constant Progression:  Worsening Chronicity:  New Relieved by:  Nothing Worsened by:  Nothing Patient with history of schizophrenia, COPD, CHF presents with suicidal ideation.  Patient stays in a local nursing facility.  Patient had reported "something is in my brain" had mentioned wanting to hurt himself.  Patient asked me to put him to sleep.     Past Medical History:  Diagnosis Date  . CHF (congestive heart failure) (HCC)   . COPD (chronic obstructive pulmonary disease) (HCC)   . History of kidney stones   . Paranoid schizophrenia (HCC)   . Seizures (HCC)   . Tobacco abuse     Patient Active Problem List   Diagnosis Date Noted  . Disorganized schizophrenia (HCC)   . Dementia (HCC) 06/08/2020  . 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 Chronic post-traumatic stress disorder (PTSD)   . Noncompliance with medications   . Subtherapeutic serum dilantin level   . Seizure (HCC)   . Hypomagnesemia 02/23/2020  . Suicidal ideation 02/23/2020  . Hyponatremia 02/19/2020  . Paranoid schizophrenia (HCC)   . Hypokalemia   . Chronic systolic CHF (congestive heart failure) (HCC) 01/22/2020  . Mixed hyperlipidemia 01/22/2020  . COPD (chronic obstructive pulmonary disease) (HCC) 01/22/2020  . Microcytic anemia 01/22/2020  . Chronic hyponatremia 01/22/2020  . Seizure disorder (HCC) 01/22/2020    Past Surgical History:  Procedure Laterality Date  . KIDNEY STONE SURGERY    . MULTIPLE EXTRACTIONS WITH ALVEOLOPLASTY Bilateral 04/29/2020   Procedure:  Extraction of tooth #'s 2-13, 17,18, 21-25, and 27-31 with alveoloplasty and bilateral lingual exostoses reductions.;  Surgeon: [name], DDS;  Location: [facility] OR;  Service: Oral Surgery;  Laterality: Bilateral;       Family History  Problem Relation Age of Onset  . Heart disease Other     Social History   Tobacco Use  . Smoking status: Current Every Day Smoker    Packs/day: 1.00    Types: Cigarettes  . Smokeless tobacco: Current User    Types: Chew  . Tobacco comment: 2-3 cigarettes smoked daily 07/22/20 [facility]   Vaping Use  . Vaping Use: Never used  Substance Use Topics  . Alcohol use: Not Currently  . Drug use: Never    Home Medications Prior to Admission medications   Medication Sig Start Date End Date Taking? Authorizing Provider  acetaminophen (TYLENOL) 325 MG tablet Take 2 tablets (650 mg total) by mouth every 4 (four) hours as needed for mild pain or fever. 06/07/20   Russella Dar, NP  ALPRAZolam Prudy Feeler) 0.5 MG tablet Take 1 tablet (0.5 mg total) by mouth 2 (two) times daily. 06/07/20   Russella Dar, NP  atorvastatin (LIPITOR) 40 MG tablet Take 1 tablet (40 mg total) by mouth daily. 06/07/20   Russella Dar, NP  busPIRone (BUSPAR) 10 MG tablet Take 1 tablet (10 mg total) by mouth 3 (three) times daily. 06/07/20   Russella Dar, NP  digoxin (LANOXIN) 0.125 MG tablet Take 1 tablet (0.125 mg total) by mouth daily. 06/07/20   Rennis Harding,  Kelle Darting, NP  digoxin (LANOXIN) 0.25 MG tablet Take 0.25 mg by mouth daily.    [provider]  eszopiclone (LUNESTA) 2 MG TABS tablet Take 1 tablet (2 mg total) by mouth at bedtime as needed for sleep. Take immediately before bedtime 07/22/20 07/22/21  Olalere, Onnie Boer A, MD  fluticasone furoate-vilanterol (BREO ELLIPTA) 200-25 MCG/INH AEPB Inhale 1 puff into the lungs daily. 06/08/20   Russella Dar, NP  furosemide (LASIX) 40 MG tablet Take 1 tablet (40 mg total) by mouth daily. 06/08/20   Russella Dar, NP  INVEGA  SUSTENNA 234 MG/1.5ML SUSY injection Inject 234 mg into the muscle every 30 (thirty) days. 06/07/20   Russella Dar, NP  Ipratropium-Albuterol (COMBIVENT) 20-100 MCG/ACT AERS respimat Inhale 1 puff into the lungs every 6 (six) hours as needed for wheezing or shortness of breath. 06/07/20   Russella Dar, NP  magnesium oxide (MAG-OX) 400 (241.3 Mg) MG tablet Take 1 tablet (400 mg total) by mouth daily. 06/08/20   Russella Dar, NP  phenytoin (DILANTIN) 300 MG ER capsule Take 1 capsule (300 mg total) by mouth 2 (two) times daily. 06/07/20   Russella Dar, NP  pregabalin (LYRICA) 75 MG capsule Take 1 capsule (75 mg total) by mouth 3 (three) times daily. 06/07/20   Russella Dar, NP  QUEtiapine (SEROQUEL) 200 MG tablet Take 1 tablet (200 mg total) by mouth daily. 06/08/20   Russella Dar, NP  sacubitril-valsartan (ENTRESTO) 24-26 MG Take 1 tablet by mouth 2 (two) times daily. 06/07/20   Russella Dar, NP    Allergies    Patient has no known allergies.  Review of Systems   Review of Systems  Unable to perform ROS: Psychiatric disorder    Physical Exam Updated Vital Signs BP (!) 162/105   Pulse 100   Temp 97.8 F (36.6 C) (Oral)   Resp 18   SpO2 96%   Physical Exam CONSTITUTIONAL: Mildly disheveled  HEAD: Normocephalic/atraumatic EYES: EOMI/PERRL ENMT: Mucous membranes moist NECK: supple no meningeal signs SPINE/BACK:entire spine nontender CV: S1/S2 noted, tachycardic LUNGS: Mild tachypnea, crackles bilaterally ABDOMEN: soft, nontender NEURO: Pt is awake/alert/appropriate, moves all extremitiesx4.  No facial droop.   EXTREMITIES: pulses normal/equal, full ROM, no lower extremity edema SKIN: warm, color normal PSYCH: Flat affect ED Results / Procedures / Treatments   Labs (all labs ordered are listed, but only abnormal results are displayed) Labs Reviewed  RESP PANEL BY RT-PCR (FLU A&B, COVID) ARPGX2  CBC WITH DIFFERENTIAL/PLATELET  COMPREHENSIVE METABOLIC PANEL   ETHANOL  RAPID URINE DRUG SCREEN, HOSP PERFORMED  DIGOXIN LEVEL  PHENYTOIN LEVEL, TOTAL    EKG EKG Interpretation  Date/Time:  Friday July 30 2020 05:45:19 EST Ventricular Rate:  97 PR Interval:    QRS Duration: 103 QT Interval:  369 QTC Calculation: 469 R Axis:   0 Text Interpretation: Sinus rhythm Probable left atrial enlargement Confirmed by Zadie Rhine ([id number]) on 07/30/2020 5:56:42 AM   Radiology No results found.  Procedures Procedures    Medications Ordered in ED Medications  LORazepam (ATIVAN) tablet 2 mg (2 mg Oral Given 07/30/20 0542)    ED Course  I have reviewed the triage vital signs and the nursing notes.  Pertinent labs & imaging results that were available during my care of the patient were reviewed by me and considered in my medical decision making (see chart for details).    MDM Rules/Calculators/A&P  6:00 AM Patient w/history of schizophrenia presenting with reportedly suicidal ideations. Pt stays in [facility].   Patient tells me he was to be put to sleep and he has something in his brain.  He seems mildly distracted. I discussed this with the nurse at his nursing home.  She reports the patient was reporting something was in his brain and could not get to sleep.  She spoke to his mother by phone and she reports this behavior typically indicates hyponatremia which he has had previously We will initiate medical work-up at this time including chest x-ray.  If labs and imaging are baseline, he will be then be evaluated by psychiatry 7:26 AM Signed out to dr Rhunette Croft at shift change to await labs and may need med evaluation  Final Clinical Impression(s) / ED Diagnoses Final diagnoses:  None    Rx / DC Orders ED Discharge Orders    None       Zadie Rhine, MD 07/30/20 [phone]

## 2020-07-30 NOTE — BH Assessment (Signed)
Assessment Note  Jim [facility] is an 54 y.o. male that presents this date from [facility]) with S/I. Patient is vague in reference to plan Dunn intent. Patient denies any H/I Dunn AVH. Patient states he is here "to get his mind right." Patient will not elaborate on content of statement and finds it difficult to render history. Patient is observed to be disorganized and speaks in a low soft voice that is difficult to understand at times. Patient is circumstantial and difficult to redirect. Per notes patient initially presented with S/I and a plan to "jump in front of a car." Patient denies at Jim time of assessment. Patient gives unrelated answers to this writer's questions and states when asked about S/I states, "I just need to read my Bible."   Per notes patient was last seen on 07/21/20, Jim Dunn assessed at that time writing on 07/21/20: Patient was brought to Jim [facility] via EMS from [facility]. Patient was just seen in Jim ED approximately 1 month ago for essentially Jim same presentation.  At that time, patient was given an Invega Injection on 06/14/2020 and was due for another one 30 days later. Patient is presenting as delusional, paranoid, experiencing auditory hallucinations and he is somewhat disorganized.  Patient is diagnosed with dementia and schizophrenia. Patient denies SI/HI and does not appear to be a threat to himself Dunn others. See Epic for entire note.    Previous assessment note dated 06/16/2020 by Jim Dunn a 54 y.o.malewho presented to Jim Dunn on voluntary basis with complaint of agitation and also suicidal ideation. Pt lives in [place] at [facility] [place] [facility]. Pt receives outpatient psychiatric services through a provider with Jim [facility] for treatment of schizophrenia. Pt was last assessed by Jim Dunn on 1011/21 with complaint of suicidal ideation and hallucination. He was given a 234 mg dose of Invega and transferred back to [facility]. Per  history, Pt has diagnoses of Schizophrenia and also Major Neurocognitive Disorder (resulting in memory and cognitive deficiencies).  Per history, Pt is his own guardian. He is prescribed Invega on a monthly basis.   During assessment, Patient presented as disorganized and difficult to direct. Patient was oriented x 3. He had poor eye contact and seemed preoccupied.  Patient's mood was reported as depressed. There was no evidence of delusion. Patient's memory and concentration were poor. Insight and judgment were poor. Patient did not appear to be responding to internal stimuli. Per notes patient is going to be admitted medically. EDP also initiated a IVC due to AMS.  Consulted with Jim Dunn who recommended patient be evaluated again after he is cleared medically.   Diagnosis: Altered mental state   Past Medical History:  Past Medical History:  Diagnosis Date  . CHF (congestive heart failure) (HCC)   . COPD (chronic obstructive pulmonary disease) (HCC)   . History of kidney stones   . Paranoid schizophrenia (HCC)   . Seizures (HCC)   . Tobacco abuse     Past Surgical History:  Procedure Laterality Date  . KIDNEY STONE SURGERY    . MULTIPLE EXTRACTIONS WITH ALVEOLOPLASTY Bilateral 04/29/2020   Procedure: Extraction of tooth #'s 2-13, 17,18, 21-25, and 27-31 with alveoloplasty and bilateral lingual exostoses reductions.;  Surgeon: [name], DDS;  Location: [facility];  Service: Oral Surgery;  Laterality: Bilateral;    Family History:  Family History  Problem Relation Age of Onset  . Heart disease Other     Social History:  reports that he has been smoking cigarettes. He has been smoking about 1.00 pack per day. His smokeless tobacco use includes chew. He reports previous alcohol use. He reports that he does not use drugs.  Additional Social History:  Alcohol / Drug Use Pain Medications: See MAR Prescriptions: See MAR Over Jim Counter: See MAR History of alcohol / drug  use?: No history of alcohol / drug abuse  CIWA: CIWA-Ar BP: 135/89 Pulse Rate: 88 COWS:    Allergies: No Known Allergies  Home Medications: (Not in a Dunn admission)   OB/GYN Status:  No LMP for male patient.  General Assessment Data Location of Assessment: [facility] Assessment: In system Is this a Tele Dunn Face-to-Face Assessment?: Face-to-Face Is this an Initial Assessment Dunn a Re-assessment for this encounter?: Initial Assessment Patient Accompanied by:: N/A Language Other than English: No Living Arrangements: Other (Comment) (In assited living ) What gender do you identify as?: Male Date Telepsych consult ordered in [facility]: 07/30/20 Marital status: Single Pregnancy Status: No Living Arrangements: Other (Comment) (AFL) Can pt return to current living arrangement?: Yes Admission Status: Involuntary Petitioner: ED Attending Is patient capable of signing voluntary admission?: No Referral Source: Self/Family/Friend Insurance type: [employer] [facility]     Crisis Care Plan Living Arrangements: Other (Comment) (AFL) Legal Guardian:  (Self) Name of Psychiatrist:  (AFL ) Name of Therapist: None  Education Status Is patient currently in school?: No Is Jim patient employed, unemployed Dunn receiving disability?: Receiving disability income  Risk to self with Jim past 6 months Suicidal Ideation: Yes-Currently Present Has patient been a risk to self within Jim past 6 months prior to admission? : No Suicidal Intent: No Has patient had any suicidal intent within Jim past 6 months prior to admission? : No Is patient at risk for suicide?: Yes Suicidal Plan?: No Has patient had any suicidal plan within Jim past 6 months prior to admission? : No Access to Means: No What has been your use of drugs/alcohol within Jim last 12 months?:  (Denies) Previous Attempts/Gestures: No How many times?: 0 Other Self Harm Risks:  (NA) Triggers for Past Attempts: Unknown Intentional Self Injurious  Behavior: None Family Suicide History: Unknown Recent stressful life event(s): Other (Comment) (Problems at [facility]) Persecutory voices/beliefs?: No Depression: No Depression Symptoms:  (Denies) Substance abuse history and/Dunn treatment for substance abuse?: No Suicide prevention information given to non-admitted patients: Not applicable  Risk to Others within Jim past 6 months Homicidal Ideation: No Does patient have any lifetime risk of violence toward others beyond Jim six months prior to admission? : No Thoughts of Harm to Others: No Current Homicidal Intent: No Current Homicidal Plan: No Access to Homicidal Means: No Identified Victim: NA History of harm to others?: No Assessment of Violence: None Noted Violent Behavior Description: NA Does patient have access to weapons?: No Criminal Charges Pending?: No Does patient have a court date: No Is patient on probation?: No  Psychosis Hallucinations: None noted Delusions: None noted  Mental Status Report Appearance/Hygiene: Unremarkable Eye Contact: Fair Motor Activity: Freedom of movement Speech: Soft, Slow Level of Consciousness: Drowsy Mood: Despair Affect: Appropriate to circumstance Anxiety Level: Minimal Thought Processes: Circumstantial Judgement: Partial Orientation: Person, Place, Time Obsessive Compulsive Thoughts/Behaviors: None  Cognitive Functioning Concentration: Fair Memory: Recent Intact, Remote Intact Is patient IDD: No Insight: Fair Impulse Control: Fair Appetite: Good Have you had any weight changes? : No Change Sleep: No Change Total Hours of Sleep: 6 Vegetative Symptoms: None  ADLScreening [facility] Assessment Services)  Patient's cognitive ability adequate to safely complete daily activities?: Yes Patient able to express need for assistance with ADLs?: Yes Independently performs ADLs?: Yes (appropriate for developmental age)  Prior Inpatient Therapy Prior Inpatient Therapy: No  Prior Outpatient  Therapy Prior Outpatient Therapy: Yes Prior Therapy Dates: Ongoing Prior Therapy Facilty/Provider(s): [facility] Reason for Treatment: Med mang Does patient have an ACCT team?: No Does patient have Intensive In-House Services?  : No Does patient have [facility] services? : No Does patient have P4CC services?: No  ADL Screening (condition at time of admission) Patient's cognitive ability adequate to safely complete daily activities?: Yes Is Jim patient deaf Dunn have difficulty hearing?: No Does Jim patient have difficulty seeing, even when wearing glasses/contacts?: No Does Jim patient have difficulty concentrating, remembering, Dunn making decisions?: No Patient able to express need for assistance with ADLs?: Yes Does Jim patient have difficulty dressing Dunn bathing?: No Independently performs ADLs?: Yes (appropriate for developmental age) Does Jim patient have difficulty walking Dunn climbing stairs?: No Weakness of Legs: None Weakness of Arms/Hands: None  Home Assistive Devices/Equipment Home Assistive Devices/Equipment: None  Therapy Consults (therapy consults require a physician order) PT Evaluation Needed: No OT Evalulation Needed: No SLP Evaluation Needed: No Abuse/Neglect Assessment (Assessment to be complete while patient is alone) Abuse/Neglect Assessment Can Be Completed: Yes Physical Abuse: Denies Verbal Abuse: Denies Sexual Abuse: Denies Exploitation of patient/patient's resources: Denies Self-Neglect: Denies Values / Beliefs Cultural Requests During Hospitalization: None Spiritual Requests During Hospitalization: None Consults Spiritual Care Consult Needed: No Transition of Care Team Consult Needed: No Advance Directives (For Healthcare) Does Patient Have a Medical Advance Directive?: No Would patient like information on creating a medical advance directive?: No - Patient declined Nutrition Screen- [facility] Adult/[facility] Patient's home diet: Regular Has Jim patient recently lost  weight without trying?: No Has Jim patient been eating poorly because of a decreased appetite?: No Malnutrition Screening Tool Score: 0        Disposition: Consulted with Jim [facility] who recommended patient be evaluated again after he is cleared medically. Disposition Initial Assessment Completed for this Encounter: Yes  On Site Evaluation by:   Reviewed with Physician:    Alfredia Ferguson 07/30/2020 6:00 PM

## 2020-07-30 NOTE — BH Assessment (Signed)
[facility] Assessment Progress Note Consulted with Arlana Pouch NP who recommended patient be evaluated again after he is cleared medically.

## 2020-07-31 NOTE — ED Notes (Signed)
[facility] called for transport.  

## 2020-07-31 NOTE — ED Notes (Addendum)
[facility] called for transport; pt has cab voucher for trip home. Time estimate is 20-25 minutes

## 2020-07-31 NOTE — ED Notes (Signed)
Called facility and notified them patient does not meet criteria and they need to send transportation. Lucinda RN states she was calling her Interior and spatial designer.

## 2020-07-31 NOTE — ED Notes (Signed)
Lucinda, RN, states that her director says that there is no staff to pick pt up and there is nothing that they can do at this time.

## 2020-07-31 NOTE — ED Notes (Signed)
Lucinda, RN, from [employer], made aware that pt would be transported back to facility by [facility] and that charges may be given to facility for the transfer.

## 2020-07-31 NOTE — Discharge Instructions (Addendum)
You were evaluated in the Emergency Department and after careful evaluation, we did not find any emergent condition requiring admission or further testing in the hospital.  Your exam/testing today was overall reassuring.  Please return to the Emergency Department if you experience any worsening of your condition.  Thank you for allowing us to be a part of your care.  

## 2020-08-23 ENCOUNTER — Telehealth: Payer: Self-pay

## 2020-08-23 NOTE — Telephone Encounter (Signed)
Pharmacy name: [facility] Drug requested: eszopiclone 2mg  CMM?: yes Key: Covered alternatives: belsomra, trazodone Tried and failed: on file Decision: sent to plan  Routing to Triage for follow-up

## 2020-08-25 NOTE — Telephone Encounter (Signed)
Outcome Approved on December 21 PA Case: [id number], Status: Approved, Coverage Starts on: 09/05/2019 12:00:00 AM, Coverage Ends on: 09/03/2021 12:00:00 AM. Questions? Contact [phone].  Pharmacy aware.  Nothing further needed at this time- will close encounter.

## 2020-09-24 IMAGING — CT CT HEAD W/O CM
3 of 4 series · 15 of 47 positions shown, 18 images · non-contrast
Comparison: None.

CLINICAL DATA: Numbness in bilateral hands and feet.

EXAM:
CT HEAD WITHOUT CONTRAST
TECHNIQUE: Contiguous axial images were obtained from the base of the skull
through the vertex without intravenous contrast.

[Series 3: head 2.0 h70h · axial · 0.45mm/px · z∈[-87,+47]mm · 9 of 85 slices shown, 12 images]
[im 9/85  brain]
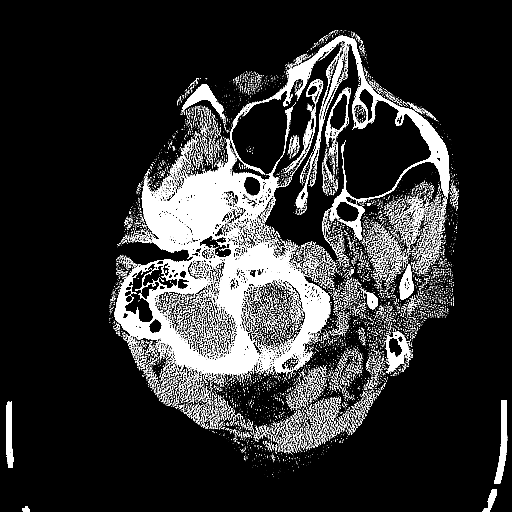
[im 9/85  bone]
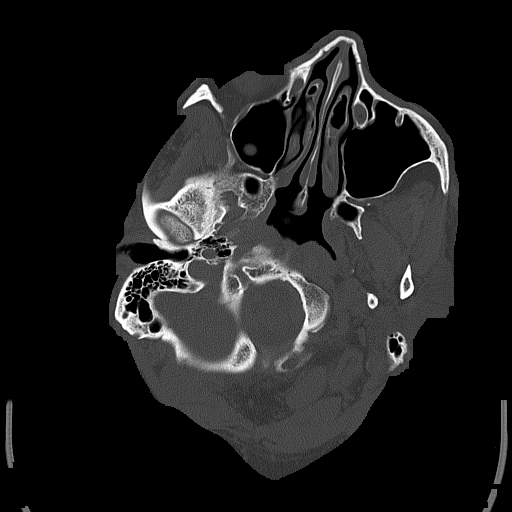
[im 17/85  brain]
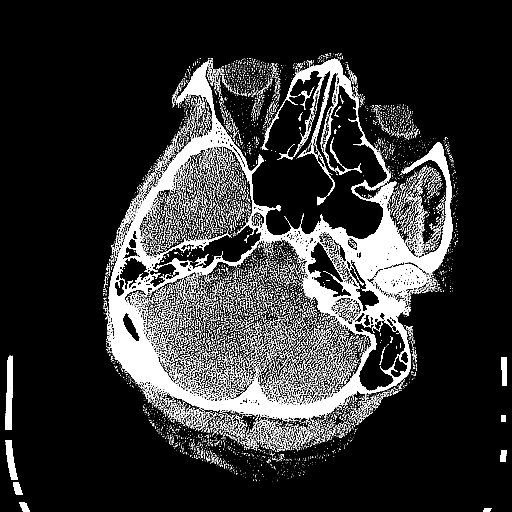
[im 26/85  brain]
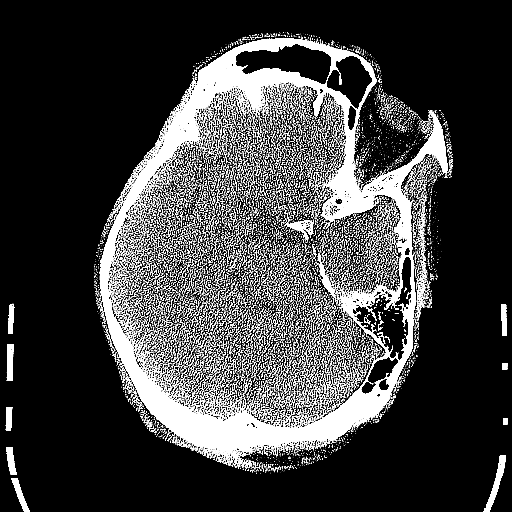
[im 34/85  brain]
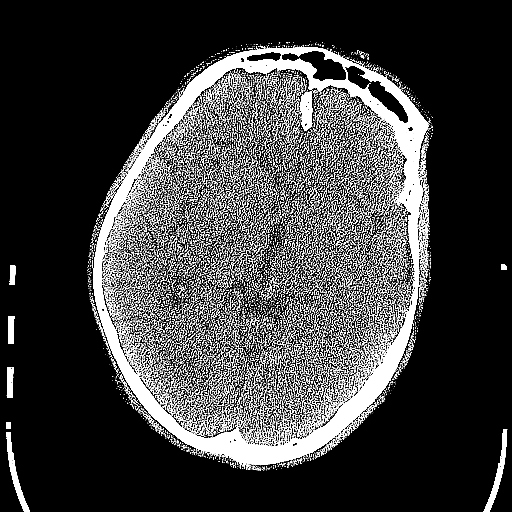
[im 43/85  brain]
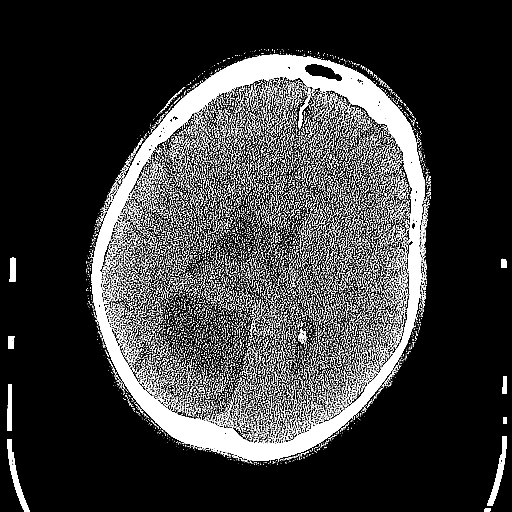
[im 43/85  bone]
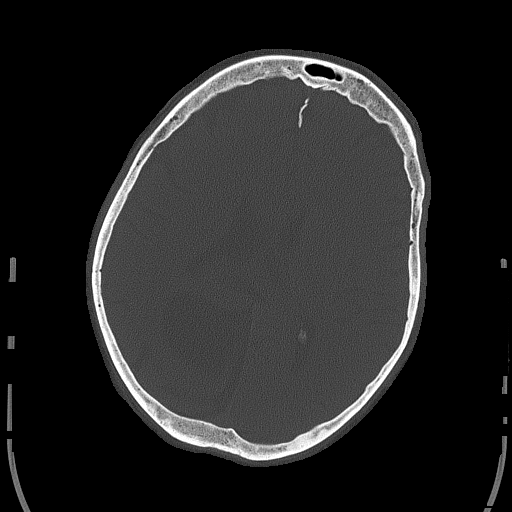
[im 51/85  brain]
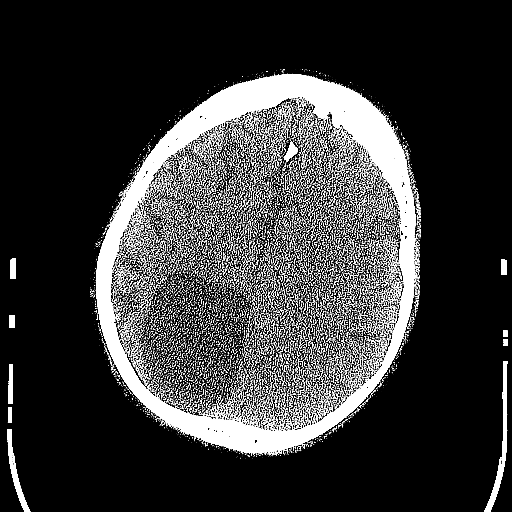
[im 59/85  brain]
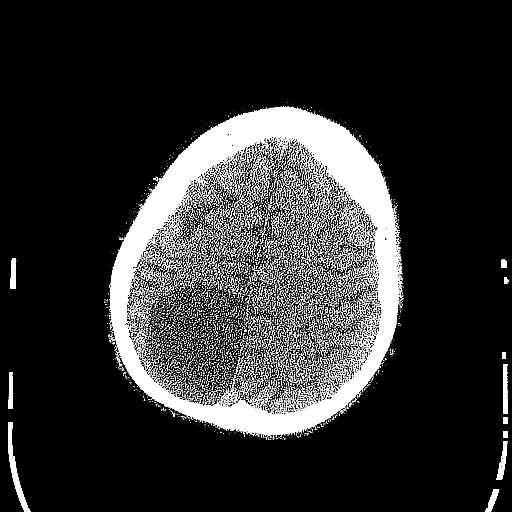
[im 68/85  brain]
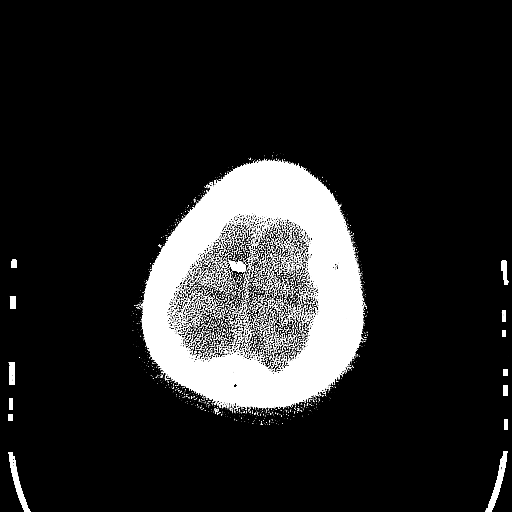
[im 76/85  brain]
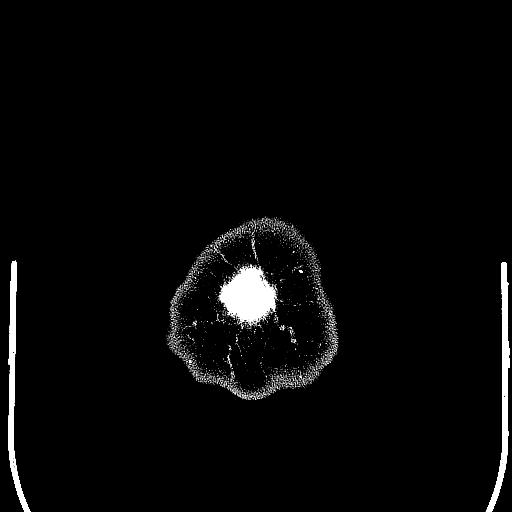
[im 76/85  bone]
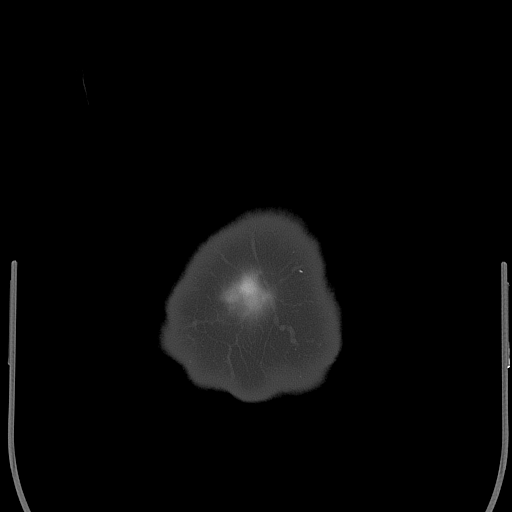

[Series 4: head 3.0 mpr cor · coronal · 0.32mm/px · 3 of 76 slices shown]
[im 26/76  brain]
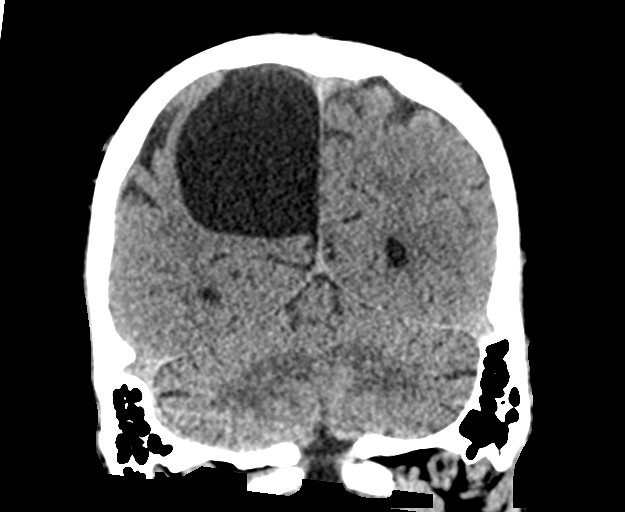
[im 34/76  brain]
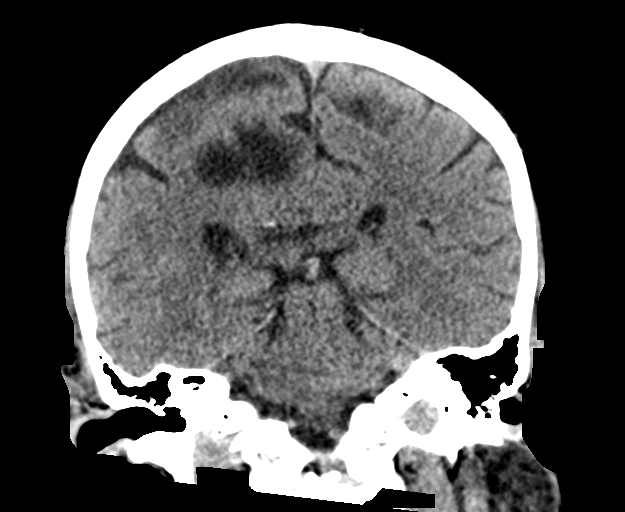
[im 42/76  brain]
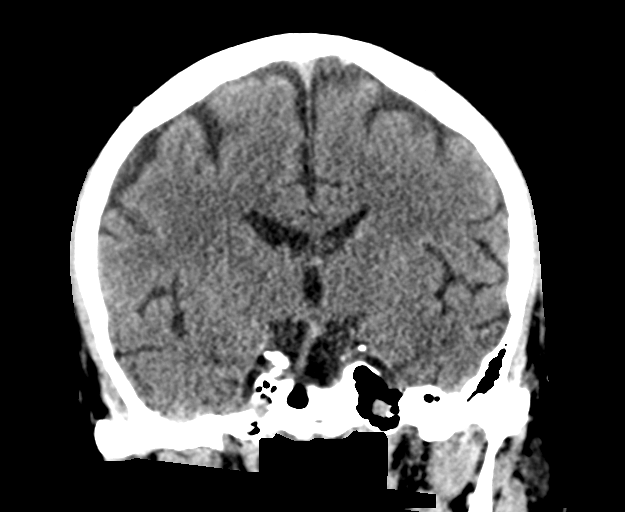

[Series 5: head 3.0 mpr sag · sagittal · 0.33mm/px · 3 of 66 slices shown]
[im 25/66  brain]
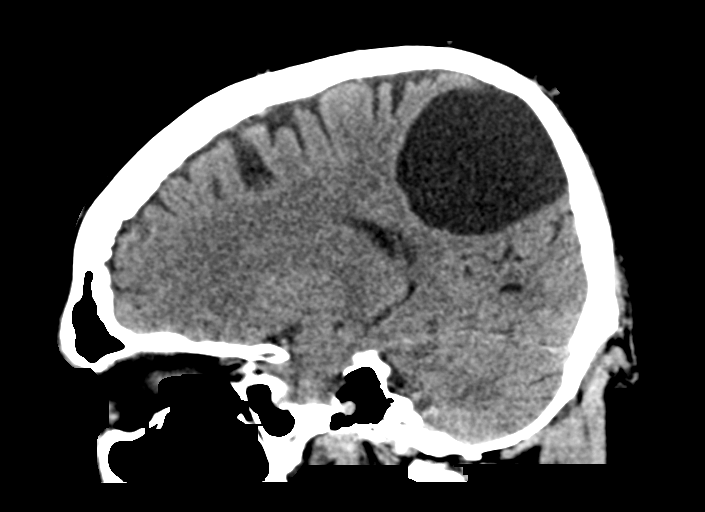
[im 33/66  brain]
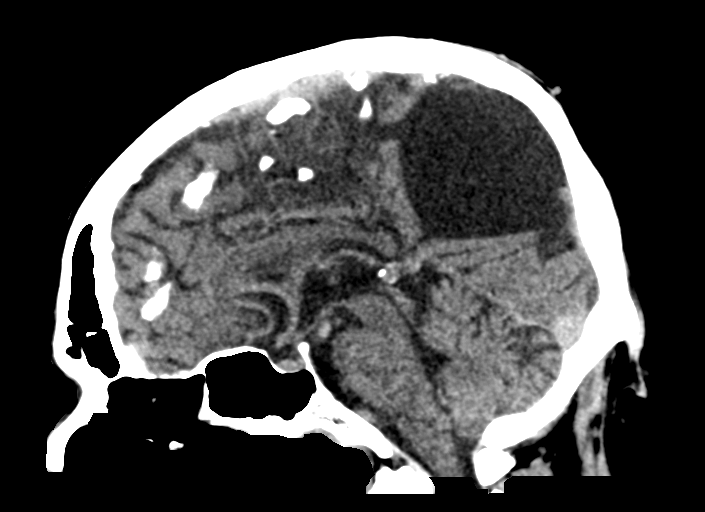
[im 40/66  brain]
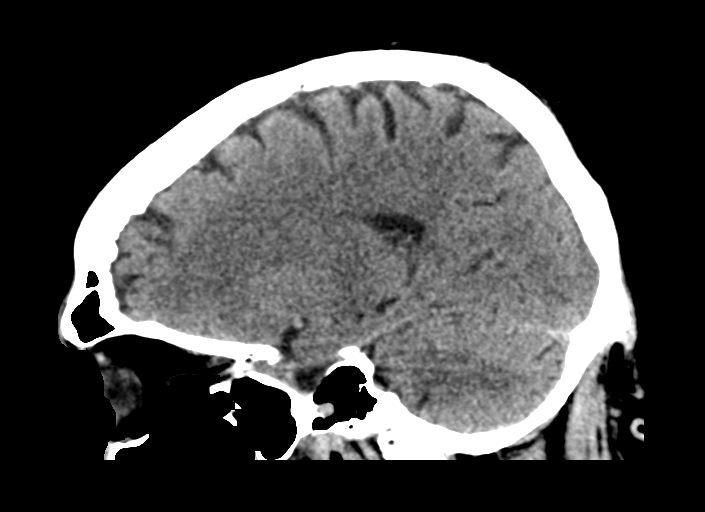

[15 of 47 positions shown; findings below may reference images not displayed]

FINDINGS: Brain: 5.8 cm cystic structure noted in the right posterior cerebral
hemisphere compatible with arachnoid cyst. No hemorrhage,
hydrocephalus, acute infarction or midline shift.

Vascular: No hyperdense vessel or unexpected calcification.

Skull: No acute calvarial abnormality.

Sinuses/Orbits: Visualized paranasal sinuses and mastoids clear.
Orbital soft tissues unremarkable.

Other: None
IMPRESSION: 5.8 cm posterior right arachnoid cyst.

No acute intracranial abnormality.

## 2020-09-28 IMAGING — DX DG CHEST 1V PORT
1 series · 1 of 1 positions shown · non-contrast
Comparison: None.

CLINICAL DATA: Seizure today. No chest symptoms. Pt states wanting
to hurt his selfseizure

EXAM:
PORTABLE CHEST 1 VIEW

[chest ap]
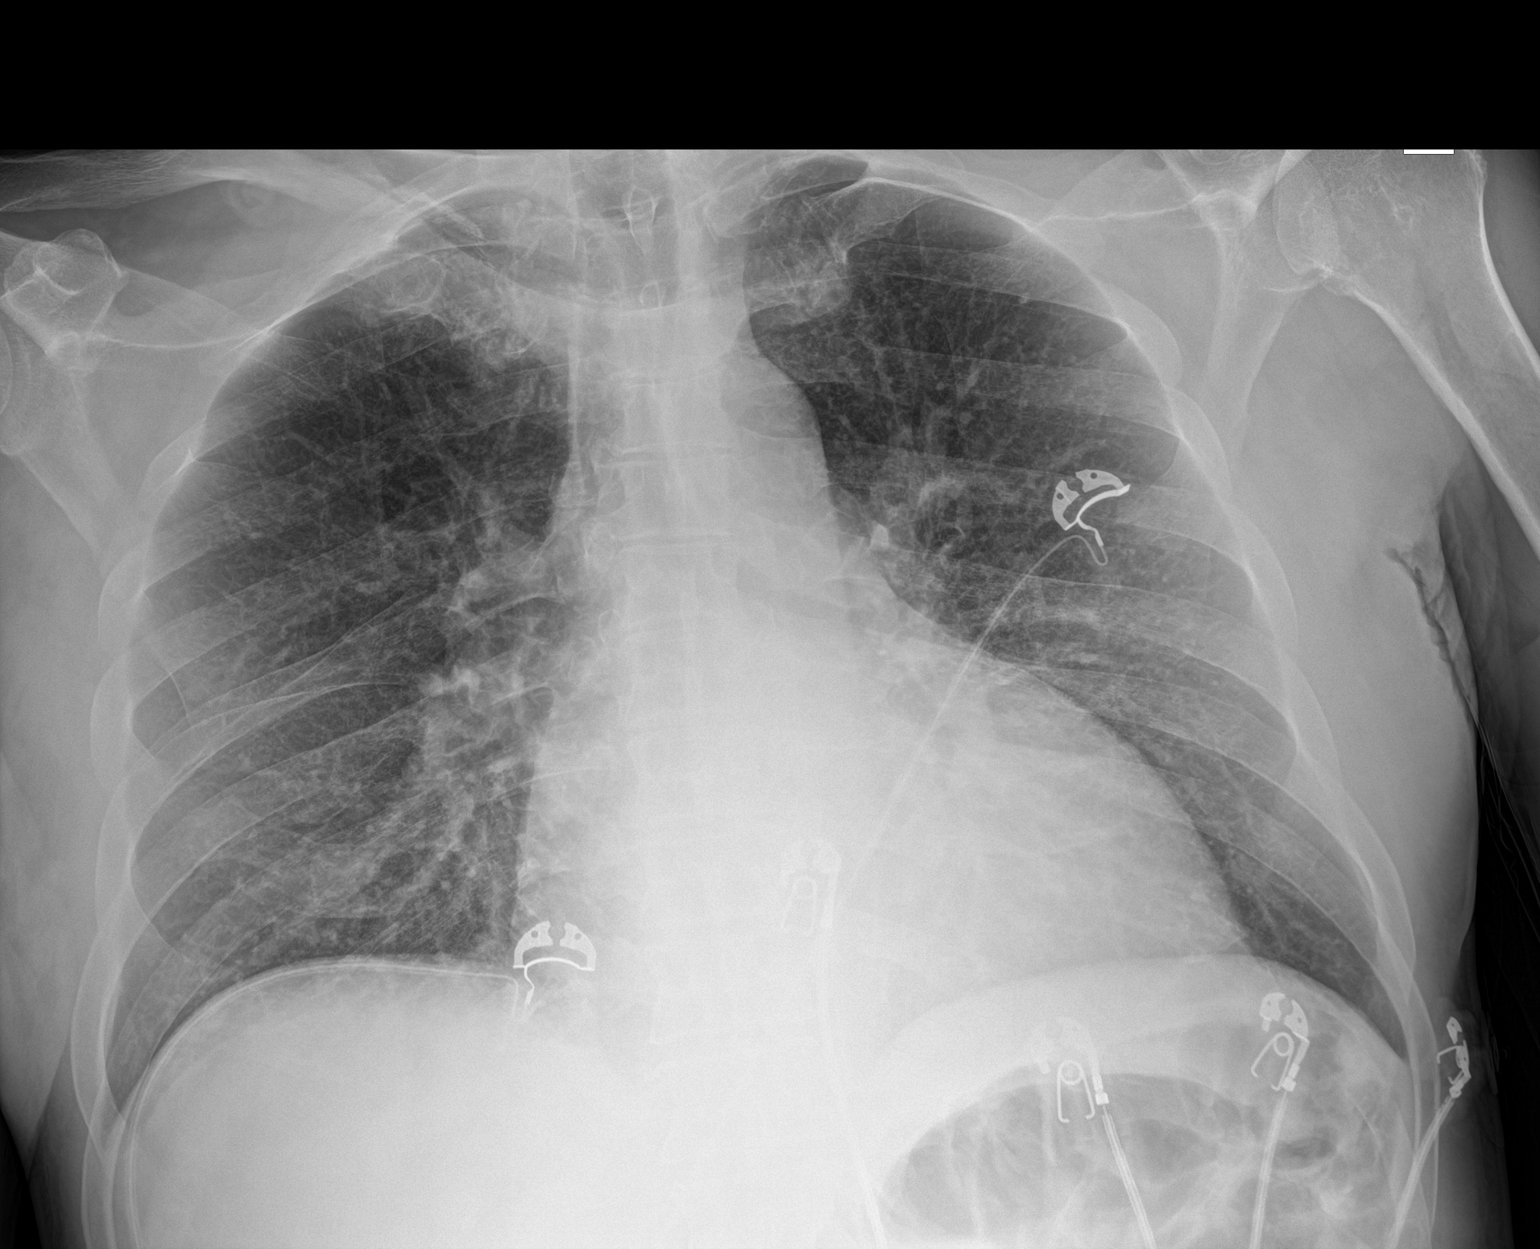

[1 of 1 positions shown; findings below may reference images not displayed]

FINDINGS: Stable mild enlarged cardiac silhouette. Low lung volumes. No focal
infiltrate. No pneumothorax. No pulmonary edema. No acute osseous
abnormality.
IMPRESSION: 1. Low lung volumes.
2. No acute findings.

## 2021-07-26 ENCOUNTER — Other Ambulatory Visit: Payer: Self-pay

## 2021-07-26 ENCOUNTER — Emergency Department (HOSPITAL_COMMUNITY): Payer: Medicare PPO

## 2021-07-26 ENCOUNTER — Emergency Department (HOSPITAL_COMMUNITY)
Admission: EM | Admit: 2021-07-26 | Discharge: 2021-07-28 | Disposition: A | Discharge status: Home / Self Care | Payer: Medicare PPO | Attending: Emergency Medicine | Admitting: Emergency Medicine

## 2021-07-26 ENCOUNTER — Encounter (HOSPITAL_COMMUNITY): Payer: Self-pay | Admitting: Emergency Medicine

## 2021-07-26 DIAGNOSIS — I110 Hypertensive heart disease with heart failure: Secondary | ICD-10-CM | POA: Insufficient documentation

## 2021-07-26 DIAGNOSIS — E871 Hypo-osmolality and hyponatremia: Secondary | ICD-10-CM | POA: Diagnosis not present

## 2021-07-26 DIAGNOSIS — Z20822 Contact with and (suspected) exposure to covid-19: Secondary | ICD-10-CM | POA: Diagnosis not present

## 2021-07-26 DIAGNOSIS — I5022 Chronic systolic (congestive) heart failure: Secondary | ICD-10-CM | POA: Diagnosis not present

## 2021-07-26 DIAGNOSIS — F17210 Nicotine dependence, cigarettes, uncomplicated: Secondary | ICD-10-CM | POA: Diagnosis not present

## 2021-07-26 DIAGNOSIS — F0390 Unspecified dementia without behavioral disturbance: Secondary | ICD-10-CM | POA: Insufficient documentation

## 2021-07-26 DIAGNOSIS — Z7951 Long term (current) use of inhaled steroids: Secondary | ICD-10-CM | POA: Diagnosis not present

## 2021-07-26 DIAGNOSIS — F201 Disorganized schizophrenia: Secondary | ICD-10-CM | POA: Diagnosis not present

## 2021-07-26 DIAGNOSIS — Z046 Encounter for general psychiatric examination, requested by authority: Secondary | ICD-10-CM | POA: Diagnosis present

## 2021-07-26 DIAGNOSIS — J449 Chronic obstructive pulmonary disease, unspecified: Secondary | ICD-10-CM | POA: Diagnosis not present

## 2021-07-26 DIAGNOSIS — Z79899 Other long term (current) drug therapy: Secondary | ICD-10-CM | POA: Insufficient documentation

## 2021-07-26 DIAGNOSIS — I509 Heart failure, unspecified: Secondary | ICD-10-CM

## 2021-07-26 DIAGNOSIS — F209 Schizophrenia, unspecified: Secondary | ICD-10-CM

## 2021-07-26 DIAGNOSIS — F200 Paranoid schizophrenia: Secondary | ICD-10-CM | POA: Insufficient documentation

## 2021-07-26 LAB — COMPREHENSIVE METABOLIC PANEL
ALT: 13 U/L (ref 0–44)
AST: 19 U/L (ref 15–41)
Albumin: 3.9 g/dL (ref 3.5–5.0)
Alkaline Phosphatase: 81 U/L (ref 38–126)
Anion gap: 9 (ref 5–15)
BUN: <5 mg/dL — ABNORMAL LOW (ref 6–20)
CO2: 24 mmol/L (ref 22–32)
Calcium: 8.7 mg/dL — ABNORMAL LOW (ref 8.9–10.3)
Chloride: 93 mmol/L — ABNORMAL LOW (ref 98–111)
Creatinine, Ser: 0.47 mg/dL — ABNORMAL LOW (ref 0.61–1.24)
GFR, Estimated: >60 mL/min (ref >60)
Glucose, Bld: 96 mg/dL (ref 70–99)
Potassium: 3.8 mmol/L (ref 3.5–5.1)
Sodium: 126 mmol/L — ABNORMAL LOW (ref 135–145)
Total Bilirubin: 0.5 mg/dL (ref 0.3–1.2)
Total Protein: 6.8 g/dL (ref 6.5–8.1)

## 2021-07-26 LAB — ETHANOL: Alcohol, Ethyl (B): <10 mg/dL (ref <10)

## 2021-07-26 LAB — PHENYTOIN LEVEL, TOTAL: Phenytoin Lvl: <2.5 ug/mL — ABNORMAL LOW (ref 10.0–20.0)

## 2021-07-26 LAB — CBC WITH DIFFERENTIAL/PLATELET
Abs Immature Granulocytes: 0.04 10*3/uL (ref 0.00–0.07)
Basophils Absolute: 0 10*3/uL (ref 0.0–0.1)
Basophils Relative: 1 %
Eosinophils Absolute: 0.2 10*3/uL (ref 0.0–0.5)
Eosinophils Relative: 2 %
HCT: 37 % — ABNORMAL LOW (ref 39.0–52.0)
Hemoglobin: 11.9 g/dL — ABNORMAL LOW (ref 13.0–17.0)
Immature Granulocytes: 1 %
Lymphocytes Relative: 26 %
Lymphs Abs: 2.2 10*3/uL (ref 0.7–4.0)
MCH: 22.9 pg — ABNORMAL LOW (ref 26.0–34.0)
MCHC: 32.2 g/dL (ref 30.0–36.0)
MCV: 71.2 fL — ABNORMAL LOW (ref 80.0–100.0)
Monocytes Absolute: 1 10*3/uL (ref 0.1–1.0)
Monocytes Relative: 12 %
Neutro Abs: 4.9 10*3/uL (ref 1.7–7.7)
Neutrophils Relative %: 58 %
Platelets: 252 10*3/uL (ref 150–400)
RBC: 5.2 MIL/uL (ref 4.22–5.81)
RDW: 16.5 % — ABNORMAL HIGH (ref 11.5–15.5)
WBC: 8.4 10*3/uL (ref 4.0–10.5)
nRBC: 0 % (ref 0.0–0.2)

## 2021-07-26 LAB — RESP PANEL BY RT-PCR (FLU A&B, COVID) ARPGX2
Influenza A by PCR: NEGATIVE
Influenza B by PCR: NEGATIVE
SARS Coronavirus 2 by RT PCR: NEGATIVE

## 2021-07-26 LAB — BRAIN NATRIURETIC PEPTIDE: B Natriuretic Peptide: 13.6 pg/mL (ref 0.0–100.0)

## 2021-07-26 LAB — DIGOXIN LEVEL: Digoxin Level: <0.2 ng/mL — ABNORMAL LOW (ref 0.8–2.0)

## 2021-07-26 MED ORDER — ALPRAZOLAM 0.5 MG PO TABS
0.5000 mg | ORAL_TABLET | Freq: Two times a day (BID) | ORAL | Status: DC
  Administered 2021-07-26 – 2021-07-28 (×4): 0.5 mg via ORAL
  Filled 2021-07-26: qty 1
  Filled 2021-07-26: qty 2
  Filled 2021-07-26 (×2): qty 1

## 2021-07-26 MED ORDER — QUETIAPINE FUMARATE 200 MG PO TABS
200.0000 mg | ORAL_TABLET | Freq: Every day | ORAL | Status: DC
  Administered 2021-07-27 – 2021-07-28 (×2): 200 mg via ORAL
  Filled 2021-07-26 (×2): qty 1

## 2021-07-26 MED ORDER — ZOLPIDEM TARTRATE 5 MG PO TABS: 10.0000 mg | ORAL_TABLET | Freq: Every evening | ORAL | Status: DC | PRN

## 2021-07-26 MED ORDER — DIGOXIN 125 MCG PO TABS
0.2500 mg | ORAL_TABLET | Freq: Every day | ORAL | Status: DC
  Administered 2021-07-26 – 2021-07-28 (×3): 0.25 mg via ORAL
  Filled 2021-07-26 (×3): qty 2

## 2021-07-26 MED ORDER — SACUBITRIL-VALSARTAN 24-26 MG PO TABS
1.0000 | ORAL_TABLET | Freq: Two times a day (BID) | ORAL | Status: DC
  Administered 2021-07-26 – 2021-07-28 (×4): 1 via ORAL
  Filled 2021-07-26 (×6): qty 1

## 2021-07-26 MED ORDER — QUETIAPINE FUMARATE 200 MG PO TABS: 200.0000 mg | ORAL_TABLET | Freq: Every day | ORAL | Status: DC

## 2021-07-26 MED ORDER — PHENYTOIN SODIUM EXTENDED 100 MG PO CAPS
300.0000 mg | ORAL_CAPSULE | Freq: Two times a day (BID) | ORAL | Status: DC
  Administered 2021-07-26 – 2021-07-28 (×4): 300 mg via ORAL
  Filled 2021-07-26 (×4): qty 3

## 2021-07-26 MED ORDER — PREGABALIN 25 MG PO CAPS
75.0000 mg | ORAL_CAPSULE | Freq: Two times a day (BID) | ORAL | Status: DC
  Administered 2021-07-26 – 2021-07-28 (×4): 75 mg via ORAL
  Filled 2021-07-26 (×4): qty 3

## 2021-07-26 MED ORDER — MELATONIN 3 MG PO TABS
3.0000 mg | ORAL_TABLET | Freq: Every evening | ORAL | Status: DC | PRN
  Administered 2021-07-26 – 2021-07-28 (×2): 3 mg via ORAL
  Filled 2021-07-26 (×2): qty 1

## 2021-07-26 MED ORDER — FUROSEMIDE 20 MG PO TABS
40.0000 mg | ORAL_TABLET | Freq: Every day | ORAL | Status: DC
  Administered 2021-07-26 – 2021-07-28 (×3): 40 mg via ORAL
  Filled 2021-07-26 (×3): qty 2

## 2021-07-26 MED ORDER — DIGOXIN 125 MCG PO TABS: 0.1250 mg | ORAL_TABLET | Freq: Every day | ORAL | Status: DC

## 2021-07-26 MED ORDER — BUSPIRONE HCL 10 MG PO TABS
10.0000 mg | ORAL_TABLET | Freq: Three times a day (TID) | ORAL | Status: DC
  Administered 2021-07-26 – 2021-07-28 (×5): 10 mg via ORAL
  Filled 2021-07-26 (×5): qty 1

## 2021-07-26 NOTE — ED Notes (Signed)
Pt reporting high level of anxiety, hold head, continuously repositioning self in bed, difficulty focusing on anything but obtaining medication to help calm him down. MD messaged and medication orders received. TTS requested evaluation and due to pt condition, assessment was rescheduled for later tonight.

## 2021-07-26 NOTE — ED Notes (Signed)
Belongings inventoried and placed in locker 6 

## 2021-07-26 NOTE — BH Assessment (Signed)
Samantha K. with TTS, sent secure message to pt's treatment, asking if someone can set up the TTS cart. This clinician took over as Lelon Mast has to see a walk-in. Clinician's name was added to the secure chat, clinician messaged: "Hi. It's Trey with TTS. Let me know when to call the cart."  Clinician attempted to contact pt's nurse however Bonita Quin was in another pt's room and unsure who was the pt's nurse. Clinician to check back, and move to the next pt in the queue.     Redmond Pulling, MS, [facility], [facility] Triage Specialist [phone]

## 2021-07-26 NOTE — ED Provider Notes (Signed)
[facility] EMERGENCY DEPARTMENT Provider Note   CSN: [id number] Arrival date & time: 07/26/21  1529     History Chief Complaint  Patient presents with   Psychiatric Evaluation    Jim Dunn is a 55 y.o. male.  HPI  55 year old male with a history of paranoid schizophrenia, seizure disorder on Dilantin, CHF on digoxin and Entresto presenting to the emergency department with his mother due to bizarre behavior.  The patient's mother provides the history.  She states that he recently came to her yesterday from the [facility] area where he normally receives care.  She states that he has not been on his medications.  She feels that he is decompensated and has been acting bizarrely.  The patient endorses some audiovisual hallucinations.  He denies any SI or HI.  He cannot tell me when he last took his medications.  He states that he recently ran out.  Level 5 caveat due to psychiatric disorder.  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   Chronic post-traumatic stress disorder (PTSD)    Noncompliance with medications    Subtherapeutic serum dilantin level    Seizure (HCC)    Hypomagnesemia 02/23/2020   Suicidal ideation 02/23/2020   Hyponatremia 02/19/2020   Paranoid schizophrenia (HCC)    Hypokalemia    Chronic systolic CHF (congestive heart failure) (HCC) 01/22/2020   Mixed hyperlipidemia 01/22/2020   COPD (chronic obstructive pulmonary disease) (HCC) 01/22/2020   Microcytic anemia 01/22/2020   Chronic hyponatremia 01/22/2020   Seizure disorder (HCC) 01/22/2020    Past Surgical History:   Procedure Laterality Date   KIDNEY STONE SURGERY     MULTIPLE EXTRACTIONS WITH ALVEOLOPLASTY Bilateral 04/29/2020   Procedure: Extraction of tooth #'s 2-13, 17,18, 21-25, and 27-31 with alveoloplasty and bilateral lingual exostoses reductions.;  Surgeon: [name], DDS;  Location: [facility] OR;  Service: Oral Surgery;  Laterality: Bilateral;       Family History  Problem Relation Age of Onset   Heart disease Other     Social History   Tobacco Use   Smoking status: Every Day    Packs/day: 1.00    Types: Cigarettes   Smokeless tobacco: Current    Types: Chew   Tobacco comments:    2-3 cigarettes smoked daily 07/22/20 [facility]   Vaping Use   Vaping Use: Never used  Substance Use Topics   Alcohol use: Not Currently   Drug use: Never    Home Medications Prior to Admission medications   Medication Sig Start Date End Date Taking? Authorizing Provider  acetaminophen (TYLENOL) 325 MG tablet Take 2 tablets (650 mg total) by mouth every 4 (four) hours as needed for mild pain or fever. 06/07/20   Russella Dar, NP  ALPRAZolam Prudy Feeler) 0.5 MG tablet Take 1 tablet (0.5 mg total) by mouth 2 (two) times daily. 06/07/20   Russella Dar, NP  atorvastatin (LIPITOR) 40 MG tablet Take 1 tablet (40 mg total) by mouth daily. Patient taking differently: Take 40 mg by mouth at bedtime.  06/07/20   Russella Dar, NP  busPIRone (BUSPAR) 10 MG tablet Take 1 tablet (10 mg total) by mouth 3 (three) times daily. 06/07/20   Russella Dar, NP  digoxin (LANOXIN) 0.125 MG tablet Take 1 tablet (0.125 mg total) by mouth daily. Patient not taking: Reported on 07/30/2020 06/07/20   Russella Dar, NP  digoxin (LANOXIN) 0.25 MG tablet Take 0.25 mg by mouth daily.    [provider]  eszopiclone (LUNESTA) 2 MG TABS tablet Take 1 tablet (2 mg total) by mouth at bedtime as needed for sleep. Take immediately before bedtime 07/22/20 07/22/21  Olalere, Onnie Boer A, MD  fluticasone furoate-vilanterol (BREO  ELLIPTA) 200-25 MCG/INH AEPB Inhale 1 puff into the lungs daily. 06/08/20   Russella Dar, NP  furosemide (LASIX) 40 MG tablet Take 1 tablet (40 mg total) by mouth daily. 06/08/20   Russella Dar, NP  INVEGA SUSTENNA 234 MG/1.5ML SUSY injection Inject 234 mg into the muscle every 30 (thirty) days. 06/07/20   Russella Dar, NP  Ipratropium-Albuterol (COMBIVENT RESPIMAT) 20-100 MCG/ACT AERS respimat Inhale 1 puff into the lungs every 6 (six) hours as needed for wheezing or shortness of breath.    [provider]  Ipratropium-Albuterol (COMBIVENT) 20-100 MCG/ACT AERS respimat Inhale 1 puff into the lungs every 6 (six) hours as needed for wheezing or shortness of breath. Patient not taking: Reported on 07/30/2020 06/07/20   Russella Dar, NP  magnesium oxide (MAG-OX) 400 (241.3 Mg) MG tablet Take 1 tablet (400 mg total) by mouth daily. 06/08/20   Russella Dar, NP  Melatonin 3 MG SUBL Place 3 mg under the tongue at bedtime as needed (for sleep- use before Ambien).    [provider]  phenytoin (DILANTIN) 100 MG ER capsule Take 300 mg by mouth in the morning and at bedtime.    [provider]  phenytoin (DILANTIN) 300 MG ER capsule Take 1 capsule (300 mg total) by mouth 2 (two) times daily. Patient not taking: Reported on 07/30/2020 06/07/20   Russella Dar, NP  pregabalin (LYRICA) 75 MG capsule Take 1 capsule (75 mg total) by mouth 3 (three) times daily. 06/07/20   Russella Dar, NP  QUEtiapine (SEROQUEL) 200 MG tablet Take 1 tablet (200 mg total) by mouth daily. 06/08/20   Russella Dar, NP  sacubitril-valsartan (ENTRESTO) 24-26 MG Take 1 tablet by mouth 2 (two) times daily. 06/07/20   Russella Dar, NP  zolpidem (AMBIEN) 10 MG tablet Take 10 mg by mouth at bedtime as needed for sleep (IF MELATONIN IS NOT EFFECTIVE).    [provider]    Allergies    Patient has no known allergies.  Review of Systems   Review of Systems  Unable to perform ROS:  Psychiatric disorder   Physical Exam Updated Vital Signs BP (!) 170/83 (BP Location: Left Arm)   Pulse 99   Temp 98.9 F (37.2 C) (Oral)   Resp 17   SpO2 98%   Physical Exam Vitals and nursing note reviewed.  Constitutional:      General: He is not in acute distress. HENT:     Head: Normocephalic and atraumatic.  Eyes:     Conjunctiva/sclera: Conjunctivae normal.     Pupils: Pupils are equal, round, and reactive to light.  Cardiovascular:     Rate and Rhythm: Normal rate and regular rhythm.  Pulmonary:     Effort: Pulmonary effort is normal. No respiratory distress.  Abdominal:     General: There is no distension.  Tenderness: There is no guarding.  Musculoskeletal:        General: No deformity or signs of injury.     Cervical back: Neck supple.  Skin:    Findings: No lesion or rash.  Neurological:     General: No focal deficit present.     Mental Status: He is alert. Mental status is at baseline.  Psychiatric:        Attention and Perception: Attention and perception normal.        Mood and Affect: Mood and affect normal.        Speech: Speech normal.        Behavior: Behavior normal. Behavior is cooperative.        Thought Content: Thought content does not include homicidal or suicidal ideation.    ED Results / Procedures / Treatments   Labs (all labs ordered are listed, but only abnormal results are displayed) Labs Reviewed  COMPREHENSIVE METABOLIC PANEL - Abnormal; Notable for the following components:      Result Value   Sodium 126 (*)    Chloride 93 (*)    BUN <5 (*)    Creatinine, Ser 0.47 (*)    Calcium 8.7 (*)    All other components within normal limits  CBC WITH DIFFERENTIAL/PLATELET - Abnormal; Notable for the following components:   Hemoglobin 11.9 (*)    HCT 37.0 (*)    MCV 71.2 (*)    MCH 22.9 (*)    RDW 16.5 (*)    All other components within normal limits  PHENYTOIN LEVEL, TOTAL - Abnormal; Notable for the following components:    Phenytoin Lvl <2.5 (*)    All other components within normal limits  DIGOXIN LEVEL - Abnormal; Notable for the following components:   Digoxin Level <0.2 (*)    All other components within normal limits  RESP PANEL BY RT-PCR (FLU A&B, COVID) ARPGX2  ETHANOL  BRAIN NATRIURETIC PEPTIDE  RAPID URINE DRUG SCREEN, HOSP PERFORMED  CBG MONITORING, ED    EKG None  Radiology DG Chest 1 View  Result Date: 07/26/2021 CLINICAL DATA:  55 year old male with history of CHF EXAM: CHEST  1 VIEW COMPARISON:  07/30/2020 FINDINGS: Cardiomediastinal silhouette unchanged in size and contour. Low lung volumes Prominent interlobular septae of with no pleural effusion. Coarsened interstitial markings, no confluent airspace disease. No pneumothorax or pleural effusion. No acute displaced fracture. Degenerative changes of the spine. IMPRESSION: Low lung volumes with interlobular septal thickening, potentially early edema versus chronic changes of prior episodes of CHF Electronically Signed   By: Gilmer Mor D.O.   On: 07/26/2021 16:52    Procedures Procedures   Medications Ordered in ED Medications  ALPRAZolam Prudy Feeler) tablet 0.5 mg (0.5 mg Oral Given 07/26/21 2138)  busPIRone (BUSPAR) tablet 10 mg (10 mg Oral Given 07/26/21 2138)  furosemide (LASIX) tablet 40 mg (has no administration in time range)  melatonin tablet 3 mg (3 mg Oral Given 07/26/21 2138)  phenytoin (DILANTIN) ER capsule 300 mg (300 mg Oral Given 07/26/21 2138)  pregabalin (LYRICA) capsule 75 mg (has no administration in time range)  zolpidem (AMBIEN) tablet 10 mg (has no administration in time range)  sacubitril-valsartan (ENTRESTO) 24-26 mg per tablet (has no administration in time range)  digoxin (LANOXIN) tablet 0.25 mg (has no administration in time range)  QUEtiapine (SEROQUEL) tablet 200 mg (has no administration in time range)    ED Course  I have reviewed the triage vital signs and the nursing notes.  Pertinent labs & imaging  results that were available during my care of the patient were reviewed by me and considered in my medical decision making (see chart for details).    MDM Rules/Calculators/A&P                            55 year old male with a history of paranoid schizophrenia, seizure disorder on Dilantin, CHF on digoxin and Entresto presenting to the emergency department with his mother due to bizarre behavior.  The patient's mother provides the history.  She states that he recently came to her yesterday from the [facility] area where he normally receives care.  She states that he has not been on his medications.  She feels that he is decompensated and has been acting bizarrely.  The patient endorses some audiovisual hallucinations.  He denies any SI or HI.  He cannot tell me when he last took his medications.  He states that he recently ran out.   On arrival, the patient was afebrile, hypertensive BP 170/83, not tachycardic or tachypneic, saturating well on room air.  Physical exam generally unremarkable with lungs clear to auscultation bilaterally, no JVD, no lower extremity edema.  Low concern for CHF exacerbation at this time.  The patient denies any SI, HI.  He endorses occasional AVH.  He has not been on his medications and his mother states that he has been acting bizarrely.  Concern for decompensated schizophrenia.  Will evaluate for organic etiology with screening labs.  CBC without a leukocytosis, anemia that is microcytic to 11.9, CMP with hyponatremia to 126.  Patient's digoxin levels and phenytoin levels were both low which is consistent as the patient states he has not been taking his outpatient medications.  Home medications were reconciled and reordered.  COVID-19 and influenza PCR was collected and resulted negative.  Concern for decompensated schizophrenia in the setting of medication noncompliance.  Overall medically cleared at this time, TTS consulted.  Final Clinical Impression(s) / ED  Diagnoses Final diagnoses:  Schizophrenia, unspecified type (HCC)  Hyponatremia    Rx / DC Orders ED Discharge Orders     None        Ernie Avena, MD 07/26/21 2240

## 2021-07-26 NOTE — ED Triage Notes (Signed)
Pt states he is here for his "mental state", but denied SI/HI/AVH. Denies drug and alcohol use. Pt tearful in triage.

## 2021-07-26 NOTE — BH Assessment (Signed)
Attempted TTS 2152--per RN pt is very anxious and RN doesn't feel pt will be cooperative.  Will let staff members know and will allow pt more time to de-escalate

## 2021-07-27 ENCOUNTER — Encounter (HOSPITAL_COMMUNITY): Payer: Self-pay | Admitting: Registered Nurse

## 2021-07-27 LAB — CBG MONITORING, ED: Glucose-Capillary: 97 mg/dL (ref 70–99)

## 2021-07-27 LAB — RAPID URINE DRUG SCREEN, HOSP PERFORMED
Amphetamines: NOT DETECTED
Barbiturates: NOT DETECTED
Benzodiazepines: POSITIVE — AB
Cocaine: NOT DETECTED
Opiates: NOT DETECTED
Tetrahydrocannabinol: NOT DETECTED

## 2021-07-27 MED ORDER — FLUTICASONE PROPIONATE 50 MCG/ACT NA SUSP
1.0000 | Freq: Every day | NASAL | Status: DC
Start: 2021-07-27 — End: 2021-07-28
  Administered 2021-07-27 – 2021-07-28 (×3): 1 via NASAL
  Filled 2021-07-27: qty 16

## 2021-07-27 NOTE — BH Assessment (Addendum)
Comprehensive Clinical Assessment (CCA) Note  07/27/2021 Coralee Rud [id number]  DISPOSITION: Merlyn Lot NP is recommending further observation until medication can be restarted. NPs will review him for [facility] admission. Advised RN Alison Murray via SecureChat and asked him to advise the EDP.   The patient demonstrates the following risk factors for suicide: Chronic risk factors for suicide include: psychiatric disorder of schizophrenia . Acute risk factors for suicide include: N/A. Protective factors for this patient include: positive social support and hope for the future. Considering these factors, the overall suicide risk at this point appears to be low. Patient is appropriate for outpatient follow up.  [place] ED from 07/26/2021 in [place] [facility] from 07/30/2020 in [place] [facility]PT ED from 07/21/2020 in [place] DEPT  C-SSRS RISK CATEGORY No Risk High Risk High Risk      Pt is a 55 yo male who presented to the ED accompanied by his mother. Per chart, pt was a poor historian so mother supplied most of the information. Hx of Schizophrenia, paranoid type. Pt has not been taking his prescribed medications. Pt stated he has not been taking meds "for months." Hx of CHF, seizures and per chart on 06/08/20, pt was diagnosed with dementia. Pt denied any OP psych providers and stated he has past IP psych admission although he could not state where or when. Pt denied SI, HI and AVH. Pt reported that he has had one suicide attempt via intentional overdose "many years ago." Pt stated, "I hurt myself in my brain" although he could not explain any further his meaning. Pt reported that he las had AVH "11 years ago." Mother reported signs of responding to internal stimuli recently. Pt reported many times throughout the assessment, "Everyone is trying to hurt me." When asked who and how, pt answered, "everyone  and however they can." Hyper-focus on others trying to hurt him. Pt denies any drug/alcohol use. Pt's BAL was 0 but he tested + for Benzos. Pt denies any current legal issues and access to firearms. Pt denies a hx of abuse. Pt stated he is divorced with no children.  Pt has answered in other ways previously. Pt stated he graduated high school and attended "special classes." Pt reported he receives disability income currently. Pt stated he is not sleeping much at night but naps during the day but eats "okay." Pt was seen in the [facility] in October and November of 2021 for similar presentation. Pt stated he was living in a SNF in [place] but was discharged due to "smoking in the building." Pt stated he does not know where he lives now.  No current OP psych providers reported. Pt was not able to complete the Depression Scale questions due to limited ability to focus at times. Call placed to mother for collateral information with pt's permission but unsuccessfully. Odd behavior noted: At the beginning of the assessment, after asking permission to "use the bathroom" pt moved into a position just off camera and seemed to urinate into a Styrofoam cup and emptying it several times in front of the camera into a sink.    Chief Complaint:  Chief Complaint  Patient presents with   Psychiatric Evaluation   Schizophrenia   Visit Diagnosis: Schizophrenia, paranoid type    CCA Screening, Triage and Referral (STR)  Patient Reported Information How did you hear about [place]? Family/Friend  What Is the Reason for Your Visit/Call Today? Pt is a 55 yo male  who presented to the ED accompanied by his mother. Per chart, pt was a poor historian so mother supplied most of the information. Hx of Schizophrenia, paranoid type. Pt has not been taking his prescribed medications. Pt stated he has not been taking meds "for months." Hx of CHF, seizures and per chart on 06/08/20, pt was diagnosed with dementia. Pt denied any OP psych providers  and stated he has past IP psych admission although he could not state where or when. Pt denied SI, HI and AVH. Pt reported that he has had one suicide attempt via intentional overdose "many years ago." Pt stated, "I hurt myself in my brain" although he could not explain any further his meaning. Pt reported that he las had AVH "11 years ago." Mother reported signs of responding to internal stimuli recently. Pt reported many times throughout the assessment, "Everyone is trying to hurt me." When asked who and how, pt answered, "everyone and however they can." Pt denies any drug/alcohol use. Pt's BAL was 0 but he tested + for Benzos. Pt denies any current legal issues and access to firearms. Pt denies a hx of abuse. Pt stated he is divorced with no children. Pt stated he graduated high school and attended "special classes." Pt reported he receives disability income currently. Pt stated he is not sleeping much at night but naps during the day but eats "okay." Pt was seen in the [facility] in October and November of 2021 for similar presentation. Pt stated he was living in a SNF in [place] but was discharged due to "smoking in the building." Pt stated he does not know where he lives now.  How Long Has This Been Causing You Problems? > than 6 months  What Do You Feel Would Help You the Most Today? -- ("I don't know")   Have You Recently Had Any Thoughts About Hurting Yourself? No  Are You Planning to Commit Suicide/Harm Yourself At This time? No   Have you Recently Had Thoughts About [place]? No  Are You Planning to Harm Someone at This Time? No  Explanation: No data recorded  Have You Used Any Alcohol or Drugs in the Past 24 Hours? No  How Long Ago Did You Use Drugs or Alcohol? No data recorded What Did You Use and How Much? No data recorded  Do You Currently Have a Therapist/Psychiatrist? No  Name of Therapist/Psychiatrist: No data recorded  Have You Been Recently Discharged From Any  Office Practice or Programs? No  Explanation of Discharge From Practice/Program: No data recorded    CCA Screening Triage Referral Assessment Type of Contact: Tele-Assessment  Telemedicine Service Delivery:   Is this Initial or Reassessment? Initial Assessment  Date Telepsych consult ordered in [facility]:  07/27/21  Time Telepsych consult ordered in [facility]:  1100  Location of Assessment: [facility]  Provider Location: [facility] Assessment Services   Collateral Involvement: Call placed to mother for collateral information with pt's permission but unsuccessfully.   Does Patient Have a Stage manager Guardian? No data recorded Name and Contact of Legal Guardian: -- (Self)  If Minor and Not Living with Parent(s), Who has Custody? No data recorded Is CPS involved or ever been involved? -- (uta)  Is APS involved or ever been involved? -- Pincus Badder)   Patient Determined To Be At Risk for Harm To Self or Others Based on Review of Patient Reported Information or Presenting Complaint? No  Method: No data recorded Availability of Means: No data recorded Intent:  No data recorded Notification Required: No data recorded Additional Information for Danger to Others Potential: No data recorded Additional Comments for Danger to Others Potential: No data recorded Are There Guns or Other Weapons in Your Home? No data recorded Types of Guns/Weapons: No data recorded Are These Weapons Safely Secured?                            No data recorded Who Could Verify You Are Able To Have These Secured: No data recorded Do You Have any Outstanding Charges, Pending Court Dates, Parole/Probation? No data recorded Contacted To Inform of Risk of Harm To Self or Others: No data recorded   Does Patient Present under Involuntary Commitment? No  IVC Papers Initial File Date: No data recorded  [place] of Residence: [place]   Patient Currently Receiving the Following Services: No data recorded  Determination of  Need: Routine (7 days) (No SI, HI, NSSH, AVH (denied).)   Options For Referral: ALF/SNF; Group Home     CCA Biopsychosocial Patient Reported Schizophrenia/Schizoaffective Diagnosis in Past: Yes   Strengths: UTA   Mental Health Symptoms Depression:   Sleep (too much or little); Difficulty Concentrating   Duration of Depressive symptoms:  Duration of Depressive Symptoms: Greater than two weeks   Mania:   None   Anxiety:    None   Psychosis:   -- (Currently denies AVH although mother reported "odd behavior" and him possibly responding to internal stimuli.)   Duration of Psychotic symptoms:  Duration of Psychotic Symptoms: Greater than six months   Trauma:   None   Obsessions:   None   Compulsions:   None   Inattention:   None   Hyperactivity/Impulsivity:   N/A   Oppositional/Defiant Behaviors:   N/A   Emotional Irregularity:   None   Other Mood/Personality Symptoms:  No data recorded   Mental Status Exam Appearance and self-care  Stature:   Tall   Weight:   Average weight   Clothing:   Disheveled   Grooming:   Neglected   Cosmetic use:   None   Posture/gait:   Normal   Motor activity:   Not Remarkable   Sensorium  Attention:   Normal   Concentration:   Scattered   Orientation:   Object; Person; Place; Situation; Time   Recall/memory:   Defective in Recent; Defective in Remote   Affect and Mood  Affect:   Flat; Blunted   Mood:   Anxious   Relating  Eye contact:   Normal   Facial expression:   Anxious   Attitude toward examiner:   Cooperative   Thought and Language  Speech flow:  Paucity; Normal   Thought content:   Persecutions (Hyper-focus on others trying to hurt him.)   Preoccupation:   Ruminations; Other (Comment) (Others trying to hurt him.)   Hallucinations:   -- (Denies current [facility])   Organization:  No data recorded  [employer] of Knowledge:   Fair   Intelligence:    Average   Abstraction:   Concrete   Judgement:   Fair   Reality Testing:   Distorted   Insight:   Lacking   Decision Making:   Only simple   Social Functioning  Social Maturity:   -- Pincus Badder)   Social Judgement:   Victimized   Stress  Stressors:   Other (Comment) (none reported)   Coping Ability:   Exhausted; Overwhelmed; Deficient supports (No current  OP psych providers reported.)   Skill Deficits:   Decision making; Self-care   Supports:   Support needed     Religion: Religion/Spirituality Are You A Religious Person?: Yes (not assessed)  Leisure/Recreation: Leisure / Recreation Do You Have Hobbies?: No  Exercise/Diet: Exercise/Diet Do You Exercise?: No Have You Gained or Lost A Significant Amount of Weight in the Past Six Months?: No Do You Follow a Special Diet?: No Do You Have Any Trouble Sleeping?: Yes Explanation of Sleeping Difficulties: Pt stated he sleeps very little but naps.   CCA Employment/Education Employment/Work Situation: Employment / Work Systems developer: On disability Has Patient ever Been in Equities trader?: No  Education: Education Is Patient Currently Attending School?: No Last Grade Completed:  (not assessed) Did You Attend College?: No Did You Have An Individualized Education Program (IIEP): Yes (Pt stated he attended "special classes.") Did You Have Any Difficulty At School?: Yes   CCA Family/Childhood History Family and Relationship History: Family history Marital status: Divorced Does patient have children?: No  Childhood History:  Childhood History By whom was/is the patient raised?: Grandparents Did patient suffer any verbal/emotional/physical/sexual abuse as a child?: No Has patient ever been sexually abused/assaulted/raped as an adolescent or adult?: No Witnessed domestic violence?: No Has patient been affected by domestic violence as an adult?: No  Child/Adolescent Assessment:     CCA  Substance Use Alcohol/Drug Use: Alcohol / Drug Use Pain Medications: See MAR Prescriptions: See MAR Over the Counter: See MAR History of alcohol / drug use?: No history of alcohol / drug abuse                         ASAM's:  Six Dimensions of Multidimensional Assessment  Dimension 1:  Acute Intoxication and/or Withdrawal Potential:      Dimension 2:  Biomedical Conditions and Complications:      Dimension 3:  Emotional, Behavioral, or Cognitive Conditions and Complications:     Dimension 4:  Readiness to Change:     Dimension 5:  Relapse, Continued use, or Continued Problem Potential:     Dimension 6:  Recovery/Living Environment:     ASAM Severity Score:    ASAM Recommended Level of Treatment:     Substance use Disorder (SUD)    Recommendations for Services/Supports/Treatments:    Discharge Disposition:    DSM5 Diagnoses: Patient Active Problem List   Diagnosis Date Noted   Disorganized schizophrenia (HCC)    Dementia (HCC) 06/08/2020   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   Chronic post-traumatic stress disorder (PTSD)    Noncompliance with medications    Subtherapeutic serum dilantin level    Seizure (HCC)    Hypomagnesemia 02/23/2020   Suicidal ideation 02/23/2020   Hyponatremia 02/19/2020   Paranoid schizophrenia (HCC)    Hypokalemia    Chronic systolic CHF (congestive heart failure) (HCC) 01/22/2020   Mixed hyperlipidemia 01/22/2020   COPD (chronic obstructive pulmonary disease) (HCC) 01/22/2020   Microcytic anemia 01/22/2020   Chronic hyponatremia 01/22/2020   Seizure disorder (HCC) 01/22/2020     Referrals to Alternative Service(s): Referred to Alternative Service(s):   Place:   Date:   Time:    Referred to Alternative Service(s):   Place:   Date:   Time:    Referred to Alternative Service(s):   Place:   Date:   Time:    Referred to Alternative Service(s):   Place:   Date:   Time:  Fuller Mandril,  Counselor  Olinda Nola T. Mare Ferrari, [place], [facility], [facility] Triage Specialist [facility]

## 2021-07-27 NOTE — ED Notes (Signed)
Pt given snack at this time  

## 2021-07-27 NOTE — ED Notes (Signed)
Break order placed 

## 2021-07-27 NOTE — ED Notes (Signed)
Patient keeps coming out of his room without his mask. Has been given a total of 4 masks this evening. Patient has a runny nose and cough, COVID negative. Advised this is a small area and that to keep everyone well, he needed to wear his mask when he is out of the room.

## 2021-07-27 NOTE — ED Provider Notes (Signed)
Emergency Medicine Observation Re-evaluation Note  Jim Dunn is a 55 y.o. male, seen on rounds today.  Pt initially presented to the ED for complaints of BH evaluation. Hx schizophrenia, reportedly off his BH meds and with psychosis/odd behavior. Currently resting quietly.   Physical Exam  BP (!) 154/86   Pulse 94   Temp 98.4 F (36.9 C) (Oral)   Resp 18   SpO2 97%  Physical Exam General: resting Cardiac: regular rate Lungs: breathing comfortably Psych: resting quietly.   ED Course / MDM    I have reviewed the labs performed to date as well as medications administered while in observation.  Recent changes in the last 24 hours include ED obs, BH med management, BH assessment.   Plan  Current plan is for [facility] team to assess, determine most appropriate disposition plan, BH med management.        Cathren Laine, MD 07/27/21 [phone]

## 2021-07-27 NOTE — ED Notes (Signed)
Patient aware we need a Urine, cup at bedside.

## 2021-07-27 NOTE — ED Notes (Signed)
Ruffin Pyo fiance [phone] requesting to speak to the patient

## 2021-07-27 NOTE — ED Notes (Signed)
Pt resting comfortably

## 2021-07-27 NOTE — ED Notes (Signed)
Patient came to the desk stating he is ready to go. MD Schlossman notified via secure chat. Patient asking for clothing. Let patient know that doctor would have to come to speak with him and decide if he could leave or not. Would return clothing is decision was to let him leave.

## 2021-07-28 DIAGNOSIS — F201 Disorganized schizophrenia: Secondary | ICD-10-CM

## 2021-07-28 MED ORDER — QUETIAPINE FUMARATE 200 MG PO TABS: 200.0000 mg | ORAL_TABLET | Freq: Every day | ORAL | 2 refills | Status: DC

## 2021-07-28 NOTE — ED Notes (Signed)
All belongings given to pt for discharge

## 2021-07-28 NOTE — ED Notes (Signed)
Pt requesting something to help with sleep - per MD Preston Fleeting okay to give melatonin at this time

## 2021-07-28 NOTE — ED Notes (Signed)
Pt making second phone call of day

## 2021-07-28 NOTE — ED Provider Notes (Signed)
  Physical Exam  BP (!) 156/101 (BP Location: Left Arm)   Pulse (!) 101   Temp 98 F (36.7 C) (Oral)   Resp 17   Ht 5\' 8"  (1.727 m)   Wt 95.3 kg   SpO2 99%   BMI 31.93 kg/m   Physical Exam  ED Course/Procedures     Procedures  MDM  Patient seen by psychiatry and cleared for discharge.  Resources given.  Refill of Seroquel       , MD 07/28/21 [phone]

## 2021-07-28 NOTE — ED Notes (Signed)
Reviewed discharge instructions with patient. Follow-up care and medications reviewed. Patient  verbalized understanding. Patient A&Ox4, VSS, and ambulatory with steady gait upon discharge.  °

## 2021-07-28 NOTE — ED Notes (Addendum)
Pt walking outside of room without mask on - pt reminded to keep mask on while out of room

## 2021-07-28 NOTE — Consult Note (Signed)
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 male patient.  Patient verbalized readiness to discharge home.  Jim Dunn reports he initially presented as he would like to establish with outpatient psychiatry.  He reports "I would like to tell you my whole story so that you can help me."  HPI:  He discusses a history of paranoid schizophrenia.  He is currently not linked with outpatient medication management.  He reports compliance with current medications including Seroquel and Xanax as prescribed by primary care provider.  Jim Dunn relocated to [place] from [facility] approximately 1 week ago.  He would like to establish outpatient psychiatry follow-up in [place] area.  He plans to remain in [place] moving forward.  Patient is assessed face-to-face by nurse practitioner.  He is seated, no acute distress.  He is alert and oriented, pleasant and cooperative during assessment.   He presents with euthymic mood, congruent affect. He denies suicidal and homicidal ideations.  He denies any history of suicide attempts, denies history of self-harm.  He contracts verbally for safety with this Probation officer.   He has normal speech and childlike behavior.  He denies both auditory and visual hallucinations.  Patient is able to converse coherently with goal-directed thoughts and no distractibility or preoccupation.  He denies paranoia.  Objectively there is no evidence of psychosis/mania or delusional thinking.  Patient resides with his mother in  [place], denies access to weapons.  He is employed in [employer].  He denies alcohol and substance use.  He endorses average sleep and appetite.  Patient offered support and encouragement.  He gives verbal consent to speak with his mother, Fenton Malling phone number [phone]. Patient's mother agrees with plan to discharge and follow-up with outpatient psychiatry.  She reports patient has been homeless in [place] for approximately 2 years prior to transitioning to her home in [place] 1 week ago.  She states "I am seeking guardianship, he has the mind of about 45 or 40-year-old and he cannot do things on his own."  Additionally she reports frustration that he smokes tobacco cigarettes in the home, she has discussed with him her preference that he smokes cigarettes outside.     Past Psychiatric History: paranoid schizophrenia  Risk to Self:   Denies Risk to Others:   Denies Prior Inpatient Therapy:   Prior Outpatient Therapy:   Followed by outpatient counseling in [facility]  Past Medical History:  Past Medical History:  Diagnosis Date   CHF (congestive heart failure) ([place])    COPD (chronic obstructive pulmonary disease) ([place])    History of kidney stones    Paranoid schizophrenia ([place])    Seizures ([place])    Tobacco abuse     Past Surgical History:  Procedure Laterality Date   KIDNEY STONE SURGERY     MULTIPLE EXTRACTIONS WITH ALVEOLOPLASTY Bilateral 04/29/2020   Procedure: Extraction of tooth #'s 2-13, 17,18, 21-25, and 27-31 with alveoloplasty and bilateral lingual exostoses reductions.;  Surgeon: Lenn Cal, DDS;  Location: [facility];  Service: Oral Surgery;  Laterality: Bilateral;   Family History:  Family History  Problem Relation Age of  Onset   Heart disease Other    Family Psychiatric  History: none reported Social History:  Social History   Substance and Sexual Activity  Alcohol Use Not Currently     Social History   Substance and  Sexual Activity  Drug Use Never    Social History   Socioeconomic History   Marital status: Single    Spouse name: Not on file   Number of children: Not on file   Years of education: Not on file   Highest education level: Not on file  Occupational History   Occupation: Disability  Tobacco Use   Smoking status: Every Day    Packs/day: 1.00    Types: Cigarettes   Smokeless tobacco: Current    Types: Chew   Tobacco comments:    2-3 cigarettes smoked daily 07/22/20 [facility]   Vaping Use   Vaping Use: Never used  Substance and Sexual Activity   Alcohol use: Not Currently   Drug use: Never   Sexual activity: Not Currently  Other Topics Concern   Not on file  Social History Narrative   Pt lives [facility].  He receives outpatient psychiatric services through the ALF.  He stated that prior to moving there, he did not have a psychiatrist.     Social Determinants of Health   Financial Resource Strain: Not on file  Food Insecurity: Not on file  Transportation Needs: Not on file  Physical Activity: Not on file  Stress: Not on file  Social Connections: Not on file   Additional Social History:    Allergies:  No Known Allergies  Labs:  Results for orders placed or performed during the hospital encounter of 07/26/21 (from the past 48 hour(s))  Resp Panel by RT-PCR (Flu A&B, Covid) Nasopharyngeal Swab     Status: None   Collection Time: 07/26/21  4:29 PM   Specimen: Nasopharyngeal Swab; Nasopharyngeal(NP) swabs in vial transport medium  Result Value Ref Range   SARS Coronavirus 2 by RT PCR NEGATIVE NEGATIVE    Comment: (NOTE) SARS-CoV-2 target nucleic acids are NOT DETECTED.  The SARS-CoV-2 RNA is generally detectable in upper respiratory specimens during the acute phase of infection. The lowest concentration of SARS-CoV-2 viral copies this assay can detect is 138 copies/mL. A negative result does not preclude SARS-Cov-2 infection and should not be used as the sole basis  for treatment or other patient management decisions. A negative result may occur with  improper specimen collection/handling, submission of specimen other than nasopharyngeal swab, presence of viral mutation(s) within the areas targeted by this assay, and inadequate number of viral copies(<138 copies/mL). A negative result must be combined with clinical observations, patient history, and epidemiological information. The expected result is Negative.  Fact Sheet for Patients:  EntrepreneurPulse.com.au  Fact Sheet for Healthcare Providers:  IncredibleEmployment.be  This test is no t yet approved or cleared by the [place] FDA and  has been authorized for detection and/or diagnosis of SARS-CoV-2 by FDA under an Emergency Use Authorization (EUA). This EUA will remain  in effect (meaning this test can be used) for the duration of the COVID-19 declaration under Section 564(b)(1) of the Act, 21 U.S.C.section 360bbb-3(b)(1), unless the authorization is terminated  or revoked sooner.       Influenza A by PCR NEGATIVE NEGATIVE   Influenza B by PCR NEGATIVE NEGATIVE    Comment: (NOTE) The Xpert Xpress SARS-CoV-2/FLU/RSV plus assay is intended as an aid in the diagnosis of influenza from Nasopharyngeal swab specimens  and should not be used as a sole basis for treatment. Nasal washings and aspirates are unacceptable for Xpert Xpress SARS-CoV-2/FLU/RSV testing.  Fact Sheet for Patients: EntrepreneurPulse.com.au  Fact Sheet for Healthcare Providers: IncredibleEmployment.be  This test is not yet approved or cleared by the [place] FDA and has been authorized for detection and/or diagnosis of SARS-CoV-2 by FDA under an Emergency Use Authorization (EUA). This EUA will remain in effect (meaning this test can be used) for the duration of the COVID-19 declaration under Section 564(b)(1) of the Act, 21  U.S.C. section 360bbb-3(b)(1), unless the authorization is terminated or revoked.  Performed at [facility], [place] [address] [place], [place] [address]   Urine rapid drug screen (hosp performed)     Status: Abnormal   Collection Time: 07/26/21  4:29 PM  Result Value Ref Range   Opiates NONE DETECTED NONE DETECTED   Cocaine NONE DETECTED NONE DETECTED   Benzodiazepines POSITIVE (A) NONE DETECTED   Amphetamines NONE DETECTED NONE DETECTED   Tetrahydrocannabinol NONE DETECTED NONE DETECTED   Barbiturates NONE DETECTED NONE DETECTED    Comment: (NOTE) DRUG SCREEN FOR MEDICAL PURPOSES ONLY.  IF CONFIRMATION IS NEEDED FOR ANY PURPOSE, NOTIFY LAB WITHIN 5 DAYS.  LOWEST DETECTABLE LIMITS FOR URINE DRUG SCREEN Drug Class                     Cutoff (ng/mL) Amphetamine and metabolites    1000 Barbiturate and metabolites    200 Benzodiazepine                 [id number] Tricyclics and metabolites     300 Opiates and metabolites        300 Cocaine and metabolites        300 THC                            50 Performed at [facility], [place] [address], [place], [place] [address]   Comprehensive metabolic panel     Status: Abnormal   Collection Time: 07/26/21  5:55 PM  Result Value Ref Range   Sodium 126 (L) 135 - 145 mmol/L   Potassium 3.8 3.5 - 5.1 mmol/L   Chloride 93 (L) 98 - 111 mmol/L   CO2 24 22 - 32 mmol/L   Glucose, Bld 96 70 - 99 mg/dL    Comment: Glucose reference range applies only to samples taken after fasting for at least 8 hours.   BUN <5 (L) 6 - 20 mg/dL    Comment: REPEATED TO VERIFY   Creatinine, Ser 0.47 (L) 0.61 - 1.24 mg/dL   Calcium 8.7 (L) 8.9 - 10.3 mg/dL   Total Protein 6.8 6.5 - 8.1 g/dL   Albumin 3.9 3.5 - 5.0 g/dL   AST 19 15 - 41 U/L   ALT 13 0 - 44 U/L   Alkaline Phosphatase 81 38 - 126 U/L   Total Bilirubin 0.5 0.3 - 1.2 mg/dL   GFR, Estimated >60 >60 mL/min    Comment: (NOTE) Calculated using the CKD-EPI Creatinine Equation (2021)     Anion gap 9 5 - 15    Comment: Performed at [facility] [address] [place],  [address]  Ethanol     Status: None   Collection Time: 07/26/21  5:55 PM  Result Value Ref Range   Alcohol, Ethyl (B) <10 <10 mg/dL    Comment: (NOTE) Lowest  detectable limit for serum alcohol is 10 mg/dL.  For medical purposes only. Performed at [facility], [place] [address] [place],  [place] [address]   CBC with Diff     Status: Abnormal   Collection Time: 07/26/21  5:55 PM  Result Value Ref Range   WBC 8.4 4.0 - 10.5 K/uL   RBC 5.20 4.22 - 5.81 MIL/uL   Hemoglobin 11.9 (L) 13.0 - 17.0 g/dL   HCT 37.0 (L) 39.0 - 52.0 %   MCV 71.2 (L) 80.0 - 100.0 fL   MCH 22.9 (L) 26.0 - 34.0 pg   MCHC 32.2 30.0 - 36.0 g/dL   RDW 16.5 (H) 11.5 - 15.5 %   Platelets 252 150 - 400 K/uL   nRBC 0.0 0.0 - 0.2 %   Neutrophils Relative % 58 %   Neutro Abs 4.9 1.7 - 7.7 K/uL   Lymphocytes Relative 26 %   Lymphs Abs 2.2 0.7 - 4.0 K/uL   Monocytes Relative 12 %   Monocytes Absolute 1.0 0.1 - 1.0 K/uL   Eosinophils Relative 2 %   Eosinophils Absolute 0.2 0.0 - 0.5 K/uL   Basophils Relative 1 %   Basophils Absolute 0.0 0.0 - 0.1 K/uL   Immature Granulocytes 1 %   Abs Immature Granulocytes 0.04 0.00 - 0.07 K/uL    Comment: Performed at [facility], [address], [place], [place] [address]  Phenytoin level, total     Status: Abnormal   Collection Time: 07/26/21  5:55 PM  Result Value Ref Range   Phenytoin Lvl <2.5 (L) 10.0 - 20.0 ug/mL    Comment: Performed at [facility] [address] [place], [place] [address]  Brain natriuretic peptide     Status: None   Collection Time: 07/26/21  5:55 PM  Result Value Ref Range   B Natriuretic Peptide 13.6 0.0 - 100.0 pg/mL    Comment: Performed at [facility] [address] [place], [place] [address]  Digoxin level     Status: Abnormal   Collection Time: 07/26/21  5:55 PM  Result Value Ref Range   Digoxin Level <0.2 (L) 0.8 -  2.0 ng/mL    Comment: RESULTS CONFIRMED BY MANUAL DILUTION Performed at [facility], [place] [address], [place], [place] [address]   CBG monitoring, ED     Status: None   Collection Time: 07/27/21  7:44 AM  Result Value Ref Range   Glucose-Capillary 97 70 - 99 mg/dL    Comment: Glucose reference range applies only to samples taken after fasting for at least 8 hours.   Comment 1 Notify RN    Comment 2 Document in Chart     Medications:  Current Facility-Administered Medications  Medication Dose Route Frequency Provider Last Rate Last Admin   ALPRAZolam Duanne Moron) tablet 0.5 mg  0.5 mg Oral BID Regan Lemming, MD   0.5 mg at 07/27/21 2133   busPIRone (BUSPAR) tablet 10 mg  10 mg Oral TID Regan Lemming, MD   10 mg at 07/27/21 2132   digoxin (LANOXIN) tablet 0.25 mg  0.25 mg Oral Daily Regan Lemming, MD   0.25 mg at 07/27/21 0901   fluticasone (FLONASE) 50 MCG/ACT nasal spray 1 spray  1 spray Each Nare Daily Regan Lemming, MD   1 spray at 07/27/21 2136   furosemide (LASIX) tablet 40 mg  40 mg Oral Daily Regan Lemming, MD   40 mg at 07/27/21 0901   melatonin  tablet 3 mg  3 mg Oral QHS PRN Ernie Avena, MD   3 mg at 07/28/21 0446   phenytoin (DILANTIN) ER capsule 300 mg  300 mg Oral BID Ernie Avena, MD   300 mg at 07/27/21 2133   pregabalin (LYRICA) capsule 75 mg  75 mg Oral BID Ernie Avena, MD   75 mg at 07/27/21 2132   QUEtiapine (SEROQUEL) tablet 200 mg  200 mg Oral Daily Ernie Avena, MD   200 mg at 07/27/21 4627   sacubitril-valsartan (ENTRESTO) 24-26 mg per tablet  1 tablet Oral BID Ernie Avena, MD   1 tablet at 07/27/21 2132   Current Outpatient Medications  Medication Sig Dispense Refill   acetaminophen (TYLENOL) 325 MG tablet Take 2 tablets (650 mg total) by mouth every 4 (four) hours as needed for mild pain or fever.     ALPRAZolam (XANAX) 0.5 MG tablet Take 1 tablet (0.5 mg total) by mouth 2 (two) times daily. 30 tablet 0   atorvastatin (LIPITOR) 40 MG tablet  Take 1 tablet (40 mg total) by mouth daily. (Patient taking differently: Take 40 mg by mouth at bedtime.) 30 tablet 2   busPIRone (BUSPAR) 10 MG tablet Take 1 tablet (10 mg total) by mouth 3 (three) times daily. (Patient taking differently: Take 10 mg by mouth 3 (three) times daily as needed (anxiety).) 90 tablet 2   chlorproMAZINE (THORAZINE) 100 MG tablet Take 100 mg by mouth 3 (three) times daily.     diazepam (VALIUM) 2 MG tablet Take 2 mg by mouth 2 (two) times daily.     digoxin (LANOXIN) 0.25 MG tablet Take 0.25 mg by mouth daily.     divalproex (DEPAKOTE) 500 MG DR tablet Take 500 mg by mouth 2 (two) times daily.     hydrOXYzine (ATARAX/VISTARIL) 25 MG tablet Take 25 mg by mouth 4 (four) times daily.     INVEGA SUSTENNA 234 MG/1.5ML SUSY injection Inject 234 mg into the muscle every 30 (thirty) days. 1.8 mL 2   metoprolol succinate (TOPROL-XL) 100 MG 24 hr tablet Take 100 mg by mouth daily.     mirtazapine (REMERON) 30 MG tablet Take 30 mg by mouth at bedtime.     paliperidone (INVEGA) 6 MG 24 hr tablet Take 6 mg by mouth at bedtime.     phenytoin (DILANTIN) 100 MG ER capsule Take 300 mg by mouth in the morning and at bedtime.     prazosin (MINIPRESS) 2 MG capsule Take 2 mg by mouth at bedtime.     sacubitril-valsartan (ENTRESTO) 24-26 MG Take 1 tablet by mouth 2 (two) times daily. 60 tablet 2   traZODone (DESYREL) 100 MG tablet Take 200 mg by mouth at bedtime as needed.     carvedilol (COREG) 6.25 MG tablet Take 6.25 mg by mouth 2 (two) times daily. (Patient not taking: Reported on 07/27/2021)     digoxin (LANOXIN) 0.125 MG tablet Take 1 tablet (0.125 mg total) by mouth daily. (Patient not taking: Reported on 07/30/2020) 30 tablet 2   divalproex (DEPAKOTE ER) 500 MG 24 hr tablet Take 1,000 mg by mouth at bedtime. (Patient not taking: Reported on 07/27/2021)     eszopiclone (LUNESTA) 2 MG TABS tablet Take 1 tablet (2 mg total) by mouth at bedtime as needed for sleep. Take immediately before  bedtime (Patient not taking: Reported on 07/27/2021) 30 tablet 2   fluticasone furoate-vilanterol (BREO ELLIPTA) 200-25 MCG/INH AEPB Inhale 1 puff into the lungs daily. (Patient not taking: Reported  on 07/27/2021) 1 each 2   furosemide (LASIX) 20 MG tablet Take 20 mg by mouth daily.     furosemide (LASIX) 40 MG tablet Take 1 tablet (40 mg total) by mouth daily. (Patient not taking: Reported on 07/27/2021) 30 tablet 2   Ipratropium-Albuterol (COMBIVENT RESPIMAT) 20-100 MCG/ACT AERS respimat Inhale 1 puff into the lungs every 6 (six) hours as needed for wheezing or shortness of breath. (Patient not taking: Reported on 07/27/2021)     magnesium oxide (MAG-OX) 400 (241.3 Mg) MG tablet Take 1 tablet (400 mg total) by mouth daily. (Patient not taking: Reported on 07/27/2021) 30 tablet 2   Melatonin 3 MG SUBL Place 3 mg under the tongue at bedtime as needed (for sleep- use before Ambien). (Patient not taking: Reported on 07/27/2021)     metoprolol tartrate (LOPRESSOR) 25 MG tablet Take 50 mg by mouth 2 (two) times daily. (Patient not taking: Reported on 07/27/2021)     phenytoin (DILANTIN) 300 MG ER capsule Take 1 capsule (300 mg total) by mouth 2 (two) times daily. (Patient not taking: Reported on 07/30/2020) 60 capsule 2   pregabalin (LYRICA) 75 MG capsule Take 1 capsule (75 mg total) by mouth 3 (three) times daily. (Patient not taking: Reported on 07/27/2021) 90 capsule 2   QUEtiapine (SEROQUEL) 200 MG tablet Take 1 tablet (200 mg total) by mouth daily. (Patient not taking: Reported on 07/27/2021) 30 tablet 2   zolpidem (AMBIEN) 10 MG tablet Take 10 mg by mouth at bedtime as needed for sleep (IF MELATONIN IS NOT EFFECTIVE). (Patient not taking: Reported on 07/27/2021)      Musculoskeletal: Strength & Muscle Tone: within normal limits Gait & Station: normal Patient leans: N/A          Psychiatric Specialty Exam:  Presentation  General Appearance: No data recorded Eye Contact:No data  recorded Speech:No data recorded Speech Volume:No data recorded Handedness:No data recorded  Mood and Affect  Mood:No data recorded Affect:No data recorded  Thought Process  Thought Processes:No data recorded Descriptions of Associations:No data recorded Orientation:No data recorded Thought Content:No data recorded History of Schizophrenia/Schizoaffective disorder:Yes  Duration of Psychotic Symptoms:Greater than six months  Hallucinations:No data recorded Ideas of Reference:No data recorded Suicidal Thoughts:No data recorded Homicidal Thoughts:No data recorded  Sensorium  Memory:No data recorded Judgment:No data recorded Insight:No data recorded  Executive Functions  Concentration:No data recorded Attention Span:No data recorded Recall:No data recorded Fund of Knowledge:No data recorded Language:No data recorded  Psychomotor Activity  Psychomotor Activity:No data recorded  Assets  Assets:No data recorded  Sleep  Sleep:No data recorded   Physical Exam: Physical Exam Constitutional:      Appearance: Normal appearance. He is normal weight.  HENT:     Head: Normocephalic and atraumatic.     Nose: Nose normal.  Cardiovascular:     Rate and Rhythm: Normal rate.  Pulmonary:     Effort: Pulmonary effort is normal.  Musculoskeletal:        General: Normal range of motion.     Cervical back: Normal range of motion.  Skin:    General: Skin is warm and dry.  Neurological:     Mental Status: He is alert and oriented to person, place, and time. Mental status is at baseline.  Psychiatric:        Attention and Perception: Attention and perception normal.        Mood and Affect: Mood and affect normal.        Speech: Speech normal.  Behavior: Behavior normal. Behavior is cooperative.        Thought Content: Thought content normal.        Cognition and Memory: Cognition and memory normal.        Judgment: Judgment normal.   Review of Systems   Constitutional: Negative.   HENT: Negative.    Eyes: Negative.   Respiratory: Negative.    Cardiovascular: Negative.   Gastrointestinal: Negative.   Genitourinary: Negative.   Musculoskeletal: Negative.   Skin: Negative.   Neurological: Negative.   Endo/Heme/Allergies: Negative.   Blood pressure (!) 161/94, pulse 78, temperature 98.4 F (36.9 C), temperature source Oral, resp. rate 20, height 5\' 8"  (1.727 m), weight 95.3 kg, SpO2 95 %. Body mass index is 31.93 kg/m.  Treatment Plan Summary: Patient reviewed with Dr Lovette Cliche. Follow-up with outpatient psychiatry, resources provided. Medications include: -Quetiapine 200 mg nightly/mood -Buspirone 10 mg 3 times daily/mood   Disposition: No evidence of imminent risk to self or others at present.   Patient does not meet criteria for psychiatric inpatient admission. Supportive therapy provided about ongoing stressors. Discussed crisis plan, support from social network, calling 911, coming to the Emergency Department, and calling Suicide Hotline.  This service was provided via telemedicine using a 2-way, interactive audio and video technology.  Names of all persons participating in this telemedicine service and their role in this encounter. Name: Jim Dunn Role: Patient  Name: Hulan Saas telephone Role: Patient's mother  Name: Beatriz Stallion Role: NP  Name: Dr Lovette Cliche Role: Psychiatry    Lucky Rathke, FNP 07/28/2021 8:56 AM

## 2021-07-28 NOTE — ED Notes (Signed)
Pt making phone call at desk at this time

## 2021-07-31 ENCOUNTER — Other Ambulatory Visit: Payer: Self-pay

## 2021-07-31 ENCOUNTER — Ambulatory Visit (INDEPENDENT_AMBULATORY_CARE_PROVIDER_SITE_OTHER)
Admission: EM | Admit: 2021-07-31 | Discharge: 2021-07-31 | Disposition: A | Discharge status: Home / Self Care | Payer: Medicare PPO | Source: Home / Self Care

## 2021-07-31 ENCOUNTER — Encounter (HOSPITAL_COMMUNITY): Payer: Self-pay | Admitting: Emergency Medicine

## 2021-07-31 ENCOUNTER — Emergency Department (HOSPITAL_COMMUNITY)
Admission: EM | Admit: 2021-07-31 | Discharge: 2021-07-31 | Disposition: A | Discharge status: Home / Self Care | Payer: Medicare PPO | Attending: Emergency Medicine | Admitting: Emergency Medicine

## 2021-07-31 DIAGNOSIS — Z9114 Patient's other noncompliance with medication regimen: Secondary | ICD-10-CM | POA: Insufficient documentation

## 2021-07-31 DIAGNOSIS — F0390 Unspecified dementia without behavioral disturbance: Secondary | ICD-10-CM | POA: Diagnosis not present

## 2021-07-31 DIAGNOSIS — F4310 Post-traumatic stress disorder, unspecified: Secondary | ICD-10-CM | POA: Insufficient documentation

## 2021-07-31 DIAGNOSIS — J449 Chronic obstructive pulmonary disease, unspecified: Secondary | ICD-10-CM | POA: Diagnosis not present

## 2021-07-31 DIAGNOSIS — I5022 Chronic systolic (congestive) heart failure: Secondary | ICD-10-CM | POA: Insufficient documentation

## 2021-07-31 DIAGNOSIS — F200 Paranoid schizophrenia: Secondary | ICD-10-CM | POA: Insufficient documentation

## 2021-07-31 DIAGNOSIS — F17210 Nicotine dependence, cigarettes, uncomplicated: Secondary | ICD-10-CM | POA: Insufficient documentation

## 2021-07-31 DIAGNOSIS — Z7951 Long term (current) use of inhaled steroids: Secondary | ICD-10-CM | POA: Diagnosis not present

## 2021-07-31 DIAGNOSIS — Z5900 Homelessness unspecified: Secondary | ICD-10-CM | POA: Insufficient documentation

## 2021-07-31 DIAGNOSIS — Z760 Encounter for issue of repeat prescription: Secondary | ICD-10-CM | POA: Diagnosis not present

## 2021-07-31 MED ORDER — BUSPIRONE HCL 10 MG PO TABS
10.0000 mg | ORAL_TABLET | Freq: Once | ORAL | Status: AC
  Administered 2021-07-31: 10:00:00 10 mg via ORAL
  Filled 2021-07-31: qty 1

## 2021-07-31 MED ORDER — QUETIAPINE FUMARATE 200 MG PO TABS: 200.0000 mg | ORAL_TABLET | Freq: Every day | ORAL | Status: DC

## 2021-07-31 MED ORDER — QUETIAPINE FUMARATE 200 MG PO TABS
200.0000 mg | ORAL_TABLET | Freq: Once | ORAL | Status: AC
  Administered 2021-07-31: 10:00:00 200 mg via ORAL
  Filled 2021-07-31: qty 1

## 2021-07-31 NOTE — ED Notes (Signed)
Discharged from triage. 

## 2021-07-31 NOTE — Discharge Instructions (Signed)
Please use resources previously provided for outpatient psychiatric management of your mental health.

## 2021-07-31 NOTE — Discharge Instructions (Signed)
Take all medications as prescribed. Keep all follow-up appointments as scheduled.  Do not consume alcohol or use illegal drugs while on prescription medications. Report any adverse effects from your medications to your primary care provider promptly.  In the event of recurrent symptoms or worsening symptoms, call 911, a crisis hotline, or go to the nearest emergency department for evaluation.   

## 2021-07-31 NOTE — Discharge Summary (Signed)
[name] to be D/C'd Home per NP order. Discussed with the patient and all questions fully answered. An After Visit Summary was printed and given to the patient. Patient escorted out and was given a bus pass. Jim Dunn  07/31/2021 5:06 PM

## 2021-07-31 NOTE — ED Triage Notes (Signed)
Pt's mom states he came [place].  States he doesn't have his psych meds and cannot sleep.  States he was up all night.  Pt siting in triage chair with eyes closed and states he is sleepy.  Denies SI/HI.  States "my mind just isn't right."  Seen in ED 11/22.

## 2021-07-31 NOTE — ED Provider Notes (Addendum)
Behavioral Health Urgent Care Medical Screening Exam  Patient Name: Jim Dunn MRN: [id number] Date of Evaluation: 07/31/21 Chief Complaint:   " I need a place to stay."  Diagnosis:  Final diagnoses:  Noncompliance with medications  Paranoid schizophrenia ([place])    History of Present illness: Jim Dunn is a 55 y.o. male presents with a charted history paranoid schizophrenia, dementia and posttraumatic stress disorder.  Reported he was recently seen and evaluated at the local emergency department for medications.  States he was advised to follow-up to [facility] urgent care walk-in clinic.   NP spoke to patient's mother for additional collateral who states " he cannot stay with me."  Reported use recently moved from [facility] where he and his girlfriend are homeless.  States he was offered homeless shelter in [place] however due to his tobacco use he was unable to stay at that shelter.  She reports patient's had multiple group homes and shelter opportunities in the past however, patient is unwilling to comply with rules.  Mother reports patient is not welcome back in her home.  Reported patient leave the stove on to try to light his cigarette, " that is unsafe for me and my daughter."  Mother reports patient was prescribed Invega in the past however she is unsure when the last time patient had to get all of his medications as indicated.   Jim Dunn denying suicidal or homicidal ideations.  Denies auditory or visual hallucinations.  Denies that he has been compliant with medications. Jim Dunn reported " I need my own apartment."  Denied illicit drug use or substance abuse history.  NP will follow-up with CSW for additional outpatient resources.   During evaluation Jim Dunn is sitting in no acute distress.He/She is alert/oriented x 3; calm/cooperative; and mood congruent with affect.  He is speaking in a clear tone at moderate volume, and normal pace; with good eye contact. His thought  process is coherent and relevant; There is no indication that he is currently responding to internal/external stimuli or experiencing delusional thought content; and he has denied suicidal/self-harm/homicidal ideation, psychosis, and paranoia.   Patient has remained calm throughout assessment and has answered questions appropriately.     At this time Jim Dunn is educated and verbalizes understanding of mental health resources and other crisis services in the community. He is instructed to call 911 and present to the nearest emergency room should he e experience any suicidal/homicidal ideation, auditory/visual/hallucinations, or detrimental worsening of his mental health condition. He was a also advised by Probation officer that he could call the toll-free phone on insurance card to assist with identifying in network counselors and agencies or number on back of Medicaid card to speak with care coordinator.      Psychiatric Specialty Exam  Presentation  General Appearance:Appropriate for Environment Eye Contact:None Speech:Clear and Coherent Speech Volume:Normal Handedness:Right  Mood and Affect  Mood:Anxious; Depressed Affect:Congruent  Thought Process  Thought Processes:Coherent Descriptions of Associations:Intact Orientation:Full (Time, Place and Person) Thought Content:Logical Diagnosis of Schizophrenia or Schizoaffective disorder in past: No  Duration of Psychotic Symptoms: Greater than six months  Hallucinations:No data recorded Ideas of Reference:None Suicidal Thoughts:No Homicidal Thoughts:No  Sensorium  Memory:Immediate Good Judgment:Fair Insight:Fair  Executive Functions  Concentration:Fair Attention Span:Good Lupus  Psychomotor Activity  Psychomotor Activity:Normal  Assets  Assets:Housing; Intimacy  Sleep  Sleep:Fair Number of hours: No data recorded  Nutritional Assessment (For OBS and FBC admissions only) Has the  patient had a weight loss or gain  of 10 pounds or more in the last 3 months?: No Has the patient had a decrease in food intake/or appetite?: No Does the patient have dental problems?: No Does the patient have eating habits or behaviors that may be indicators of an eating disorder including binging or inducing vomiting?: No Has the patient recently lost weight without trying?: 0 Has the patient been eating poorly because of a decreased appetite?: 0 Malnutrition Screening Tool Score: 0   Physical Exam: Physical Exam Vitals and nursing note reviewed.  Cardiovascular:     Rate and Rhythm: Normal rate.     Pulses: Normal pulses.  Pulmonary:     Effort: Pulmonary effort is normal.  Neurological:     Mental Status: He is oriented to person, place, and time.  Psychiatric:        Attention and Perception: Attention and perception normal.        Mood and Affect: Mood and affect normal.        Behavior: Behavior normal. Behavior is cooperative.        Thought Content: Thought content normal. Thought content does not include suicidal ideation.        Judgment: Judgment normal.   Review of Systems  HENT: Negative.    Cardiovascular: Negative.   Gastrointestinal: Negative.   Psychiatric/Behavioral:  Positive for depression. Negative for suicidal ideas. The patient is nervous/anxious.   All other systems reviewed and are negative. There were no vitals taken for this visit. There is no height or weight on file to calculate BMI.  Musculoskeletal: Strength & Muscle Tone: within normal limits Gait & Station: normal Patient leans: N/A   [facility] MSE Discharge Disposition for Follow up and Recommendations: Based on my evaluation the patient does not appear to have an emergency medical condition and can be discharged with resources and follow up care in outpatient services for Medication Management   Oneta Rack, NP 07/31/2021, 4:10 PM

## 2021-07-31 NOTE — Progress Notes (Signed)
   07/31/21 1600  [facility] Triage Screening (Walk-ins at [facility] only)  How Did You Hear About [place]? Self  What Is the Reason for Your Visit/Call Today? Pt brought to the [facility] voluntarily via [employer] after a call from his family. Their complaint is that he leaves the stove on and unattended which they feel is endangering them. Per his mother, he cannot reutrn to her home with her daughter where he ahs been staying. Pt denies SI, HI, NSSH, AVH and drug/alcohol use. Pt is alert and oriented to person, place and situaiton. Pt was seen and discharged from the ED this morning and was seen on 11/23 by TTS ([facility]) and 11/14 by NP and discharged. Per hx, pt has a hx of schizophrenia and dementia. Pt is not compliant with his prescribed medications, mentla health or medical, per his mother. Per mother, he has tried and been asked to leave 2 group homes due to non-complaince with their smoking rules. Per mother, he is a "chain smoker." Per mother, he taks an Invega IM but details on consistency is vague. Per mother, he and "his significant other" were temporarily staying with her at her daughter's home but she stated "he is really homeless." Mom stated he cannot return to the home where she stays.  How Long Has This Been Causing You Problems? 1 wk - 1 month  Have You Recently Had Any Thoughts About Hurting Yourself? No  Are You Planning to Commit Suicide/Harm Yourself At This time? No  Have you Recently Had Thoughts About Hurting Someone [name]? No  Are You Planning To Harm Someone At This Time? No  Are you currently experiencing any auditory, visual or other hallucinations? No  Have You Used Any Alcohol or Drugs in the Past 24 Hours? No  Do you have any current medical co-morbidities that require immediate attention? No  Clinician description of patient physical appearance/behavior: Pt is alert and oriented to person, place, situation. Pt is cooperative and calm. Pt is neatly and casually dressed and appears somewhat fidgety.  Pt's sppech, movement and thought processes appears to be within the normal limits. Pt's mood appears to be sad and his affect is flat. Pt does not seem to be responding to any internal stimuli.  What Do You Feel Would Help You the Most Today? Housing Assistance  If access to [facility] Urgent [facility] was not available, would you have sought care in the Emergency Department? Yes  Determination of Need Routine (7 days)  Corrie Dandy T. Jimmye Norman, MS, [facility], [facility] Triage Specialist [facility]

## 2021-07-31 NOTE — Progress Notes (Addendum)
CSW proivded resources for homeless shelters in [place] and provided pt with a bus pass.  CSW met with pt and provided bus pass along with education about community resources and homeless shelters. CSW provided additional education about [employer] and their year program. Pt was agreeable.    Benjaman Kindler, MSW, LCSWA 07/31/2021 4:14 PM

## 2021-07-31 NOTE — ED Provider Notes (Signed)
[facility] EMERGENCY DEPARTMENT Provider Note   CSN: [id number] Arrival date & time: 07/31/21  2671     History Chief Complaint  Patient presents with   Needs Pysch meds    Jim Dunn is a 55 y.o. male.  The history is provided by the patient and a parent. No language interpreter was used.   55 year old male significant history of schizophrenia brought in by mom with request for his psychiatric medication.  Patient recently relocated to [facility] from [facility] approximately 2 weeks ago.  He does not have an established outpatient psychiatry.  He was brought by mom to the ER on 11/22 for psychiatric assessment and discharged on 11/24.  He was prescribed quetiapine 200 mg nightly/mood, buspirone 10 mg 3 times daily/mood.  He was given resource for outpatient follow-up.  Mom brought patient back stating that he did not receive his medication and she have not follow-up with outpatient psychiatry yet.  Currently patient denies SI or HI.  Mom did report that he stays up at nighttime and sleep throughout the day.  No other complaint.  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   Chronic post-traumatic stress disorder (PTSD)    Noncompliance with medications    Subtherapeutic serum dilantin level    Seizure (HCC)    Hypomagnesemia 02/23/2020   Suicidal ideation 02/23/2020   Hyponatremia 02/19/2020   Paranoid schizophrenia (HCC)    Hypokalemia    Chronic systolic CHF (congestive heart failure) (HCC) 01/22/2020   Mixed hyperlipidemia 01/22/2020   COPD (chronic obstructive pulmonary disease)  (HCC) 01/22/2020   Microcytic anemia 01/22/2020   Chronic hyponatremia 01/22/2020   Seizure disorder (HCC) 01/22/2020    Past Surgical History:  Procedure Laterality Date   KIDNEY STONE SURGERY     MULTIPLE EXTRACTIONS WITH ALVEOLOPLASTY Bilateral 04/29/2020   Procedure: Extraction of tooth #'s 2-13, 17,18, 21-25, and 27-31 with alveoloplasty and bilateral lingual exostoses reductions.;  Surgeon: [name], DDS;  Location: [facility] OR;  Service: Oral Surgery;  Laterality: Bilateral;       Family History  Problem Relation Age of Onset   Heart disease Other     Social History   Tobacco Use   Smoking status: Every Day    Packs/day: 1.00    Types: Cigarettes   Smokeless tobacco: Current    Types: Chew   Tobacco comments:    2-3 cigarettes smoked daily 07/22/20 [facility]   Vaping Use   Vaping Use: Never used  Substance Use Topics   Alcohol use: Not Currently   Drug use: Never    Home Medications Prior to Admission medications   Medication Sig Start Date End Date Taking? Authorizing Provider  acetaminophen (TYLENOL) 325 MG tablet Take 2 tablets (650 mg total) by mouth every 4 (four) hours as needed for mild pain or fever. 06/07/20   Russella Dar, NP  ALPRAZolam Prudy Feeler) 0.5 MG tablet Take 1 tablet (0.5 mg total) by mouth 2 (two) times daily. 06/07/20   Russella Dar, NP  atorvastatin (LIPITOR) 40 MG tablet Take 1 tablet (40  mg total) by mouth daily. Patient taking differently: Take 40 mg by mouth at bedtime. 06/07/20   Russella Dar, NP  busPIRone (BUSPAR) 10 MG tablet Take 1 tablet (10 mg total) by mouth 3 (three) times daily. Patient taking differently: Take 10 mg by mouth 3 (three) times daily as needed (anxiety). 06/07/20   Russella Dar, NP  carvedilol (COREG) 6.25 MG tablet Take 6.25 mg by mouth 2 (two) times daily. Patient not taking: Reported on 07/27/2021 02/15/21   [provider]  chlorproMAZINE (THORAZINE) 100 MG tablet Take 100 mg by mouth 3 (three)  times daily. 07/04/21   [provider]  diazepam (VALIUM) 2 MG tablet Take 2 mg by mouth 2 (two) times daily. 07/22/21   [provider]  digoxin (LANOXIN) 0.125 MG tablet Take 1 tablet (0.125 mg total) by mouth daily. Patient not taking: Reported on 07/30/2020 06/07/20   Russella Dar, NP  digoxin (LANOXIN) 0.25 MG tablet Take 0.25 mg by mouth daily.    [provider]  divalproex (DEPAKOTE ER) 500 MG 24 hr tablet Take 1,000 mg by mouth at bedtime. Patient not taking: Reported on 07/27/2021 07/04/21   [provider]  divalproex (DEPAKOTE) 500 MG DR tablet Take 500 mg by mouth 2 (two) times daily. 07/22/21   [provider]  eszopiclone (LUNESTA) 2 MG TABS tablet Take 1 tablet (2 mg total) by mouth at bedtime as needed for sleep. Take immediately before bedtime Patient not taking: Reported on 07/27/2021 07/22/20 07/22/21  Virl Diamond A, MD  fluticasone furoate-vilanterol (BREO ELLIPTA) 200-25 MCG/INH AEPB Inhale 1 puff into the lungs daily. Patient not taking: Reported on 07/27/2021 06/08/20   Russella Dar, NP  furosemide (LASIX) 20 MG tablet Take 20 mg by mouth daily. 07/04/21   [provider]  furosemide (LASIX) 40 MG tablet Take 1 tablet (40 mg total) by mouth daily. Patient not taking: Reported on 07/27/2021 06/08/20   Russella Dar, NP  hydrOXYzine (ATARAX/VISTARIL) 25 MG tablet Take 25 mg by mouth 4 (four) times daily. 06/27/21   [provider]  INVEGA SUSTENNA 234 MG/1.5ML SUSY injection Inject 234 mg into the muscle every 30 (thirty) days. 06/07/20   Russella Dar, NP  Ipratropium-Albuterol (COMBIVENT RESPIMAT) 20-100 MCG/ACT AERS respimat Inhale 1 puff into the lungs every 6 (six) hours as needed for wheezing or shortness of breath. Patient not taking: Reported on 07/27/2021    [provider]  magnesium oxide (MAG-OX) 400 (241.3 Mg) MG tablet Take 1 tablet (400 mg total) by mouth daily. Patient not  taking: Reported on 07/27/2021 06/08/20   Russella Dar, NP  Melatonin 3 MG SUBL Place 3 mg under the tongue at bedtime as needed (for sleep- use before Ambien). Patient not taking: Reported on 07/27/2021    [provider]  metoprolol succinate (TOPROL-XL) 100 MG 24 hr tablet Take 100 mg by mouth daily. 07/22/21   [provider]  metoprolol tartrate (LOPRESSOR) 25 MG tablet Take 50 mg by mouth 2 (two) times daily. Patient not taking: Reported on 07/27/2021 05/03/21   [provider]  mirtazapine (REMERON) 30 MG tablet Take 30 mg by mouth at bedtime. 07/14/21   [provider]  paliperidone (INVEGA) 6 MG 24 hr tablet Take 6 mg by mouth at bedtime. 07/14/21   [provider]  phenytoin (DILANTIN) 100 MG ER capsule Take 300 mg by mouth in the morning and at bedtime.    [provider]  phenytoin (DILANTIN) 300 MG ER capsule Take 1 capsule (300 mg total) by mouth 2 (two) times daily. Patient not taking: Reported on 07/30/2020 06/07/20   Russella Dar, NP  prazosin (MINIPRESS) 2 MG capsule Take 2 mg by mouth at bedtime. 06/08/21   [provider]  pregabalin (LYRICA) 75 MG capsule Take 1 capsule (75 mg total) by mouth 3 (three) times daily. Patient not taking: Reported on 07/27/2021 06/07/20   Russella Dar, NP  QUEtiapine (SEROQUEL) 200 MG tablet Take 1 tablet (200 mg total) by mouth daily. 07/28/21   Benjiman Core, MD  sacubitril-valsartan (ENTRESTO) 24-26 MG Take 1 tablet by mouth 2 (two) times daily. 06/07/20   Russella Dar, NP  traZODone (DESYREL) 100 MG tablet Take 200 mg by mouth at bedtime as needed. 07/22/21   [provider]  zolpidem (AMBIEN) 10 MG tablet Take 10 mg by mouth at bedtime as needed for sleep (IF MELATONIN IS NOT EFFECTIVE). Patient not taking: Reported on 07/27/2021    [provider]    Allergies    Patient has no known allergies.  Review of Systems   Review of Systems  All  other systems reviewed and are negative.  Physical Exam Updated Vital Signs BP (!) 165/101 (BP Location: Right Arm)   Pulse 93   Temp 97.7 F (36.5 C) (Oral)   Resp 18   SpO2 94%   Physical Exam Vitals and nursing note reviewed.  Constitutional:      General: He is not in acute distress.    Appearance: He is well-developed.  HENT:     Head: Atraumatic.  Eyes:     Conjunctiva/sclera: Conjunctivae normal.  Cardiovascular:     Rate and Rhythm: Normal rate and regular rhythm.     Pulses: Normal pulses.     Heart sounds: Normal heart sounds.  Pulmonary:     Effort: Pulmonary effort is normal.     Breath sounds: Normal breath sounds.  Abdominal:     Palpations: Abdomen is soft.  Musculoskeletal:     Cervical back: Neck supple.  Skin:    Findings: No rash.  Neurological:     Mental Status: He is alert.  Psychiatric:        Thought Content: Thought content is not paranoid. Thought content does not include homicidal or suicidal ideation.    ED Results / Procedures / Treatments   Labs (all labs ordered are listed, but only abnormal results are displayed) Labs Reviewed - No data to display  EKG None  Radiology No results found.  Procedures Procedures   Medications Ordered in ED Medications - No data to display  ED Course  I have reviewed the triage vital signs and the nursing notes.  Pertinent labs & imaging results that were available during my care of the patient were reviewed by me and considered in my medical decision making (see chart for details).    MDM Rules/Calculators/A&P                           BP (!) 165/101 (BP Location: Right Arm)   Pulse 93   Temp 97.7 F (36.5 C) (Oral)   Resp 18   SpO2 94%   Final Clinical Impression(s) / ED Diagnoses Final diagnoses:  Medication refill    Rx / DC Orders ED Discharge Orders     None      9:40 AM Patient is here because he have not  had his psychiatric medication since he was discharged 3  days ago from the hospital.  He has not follow-up with outpatient psychiatry yet.  At this time patient denies SI or HI.  He is resting comfortably.  He has been psychiatrically evaluated fairly recently.  I will provide his medication for today, and encourage outpatient follow-up with psychiatry for further care.   Fayrene Helper, PA-C 07/31/21 8675    Gloris Manchester, MD 08/02/21 [phone]

## 2021-08-01 ENCOUNTER — Encounter (HOSPITAL_COMMUNITY): Payer: Self-pay | Admitting: Emergency Medicine

## 2021-08-01 ENCOUNTER — Other Ambulatory Visit: Payer: Self-pay

## 2021-08-01 ENCOUNTER — Ambulatory Visit (INDEPENDENT_AMBULATORY_CARE_PROVIDER_SITE_OTHER)
Admission: EM | Admit: 2021-08-01 | Discharge: 2021-08-01 | Disposition: A | Discharge status: Home / Self Care | Payer: Medicare PPO | Source: Home / Self Care

## 2021-08-01 ENCOUNTER — Emergency Department (HOSPITAL_COMMUNITY)
Admission: EM | Admit: 2021-08-01 | Discharge: 2021-08-03 | Disposition: A | Discharge status: Home / Self Care | Payer: Medicare PPO | Attending: Internal Medicine | Admitting: Internal Medicine

## 2021-08-01 DIAGNOSIS — F209 Schizophrenia, unspecified: Secondary | ICD-10-CM

## 2021-08-01 DIAGNOSIS — F4312 Post-traumatic stress disorder, chronic: Secondary | ICD-10-CM | POA: Diagnosis not present

## 2021-08-01 DIAGNOSIS — Z20822 Contact with and (suspected) exposure to covid-19: Secondary | ICD-10-CM | POA: Insufficient documentation

## 2021-08-01 DIAGNOSIS — I5022 Chronic systolic (congestive) heart failure: Secondary | ICD-10-CM | POA: Diagnosis present

## 2021-08-01 DIAGNOSIS — F0390 Unspecified dementia without behavioral disturbance: Secondary | ICD-10-CM | POA: Diagnosis not present

## 2021-08-01 DIAGNOSIS — F201 Disorganized schizophrenia: Secondary | ICD-10-CM | POA: Diagnosis not present

## 2021-08-01 DIAGNOSIS — Z046 Encounter for general psychiatric examination, requested by authority: Secondary | ICD-10-CM | POA: Insufficient documentation

## 2021-08-01 DIAGNOSIS — R440 Auditory hallucinations: Secondary | ICD-10-CM | POA: Diagnosis not present

## 2021-08-01 DIAGNOSIS — E871 Hypo-osmolality and hyponatremia: Secondary | ICD-10-CM | POA: Diagnosis present

## 2021-08-01 DIAGNOSIS — R45851 Suicidal ideations: Secondary | ICD-10-CM | POA: Diagnosis not present

## 2021-08-01 DIAGNOSIS — F250 Schizoaffective disorder, bipolar type: Secondary | ICD-10-CM | POA: Diagnosis not present

## 2021-08-01 DIAGNOSIS — G40909 Epilepsy, unspecified, not intractable, without status epilepticus: Secondary | ICD-10-CM

## 2021-08-01 DIAGNOSIS — J449 Chronic obstructive pulmonary disease, unspecified: Secondary | ICD-10-CM | POA: Diagnosis present

## 2021-08-01 HISTORY — DX: Hypo-osmolality and hyponatremia: E87.1

## 2021-08-01 LAB — COMPREHENSIVE METABOLIC PANEL
ALT: 16 U/L (ref 0–44)
AST: 41 U/L (ref 15–41)
Albumin: 3.9 g/dL (ref 3.5–5.0)
Alkaline Phosphatase: 98 U/L (ref 38–126)
Anion gap: 10 (ref 5–15)
BUN: 8 mg/dL (ref 6–20)
CO2: 23 mmol/L (ref 22–32)
Calcium: 9 mg/dL (ref 8.9–10.3)
Chloride: 88 mmol/L — ABNORMAL LOW (ref 98–111)
Creatinine, Ser: 0.69 mg/dL (ref 0.61–1.24)
GFR, Estimated: >60 mL/min (ref >60)
Glucose, Bld: 97 mg/dL (ref 70–99)
Potassium: 4.1 mmol/L (ref 3.5–5.1)
Sodium: 121 mmol/L — ABNORMAL LOW (ref 135–145)
Total Bilirubin: 0.6 mg/dL (ref 0.3–1.2)
Total Protein: 7 g/dL (ref 6.5–8.1)

## 2021-08-01 LAB — CBC
HCT: 38.3 % — ABNORMAL LOW (ref 39.0–52.0)
Hemoglobin: 12.6 g/dL — ABNORMAL LOW (ref 13.0–17.0)
MCH: 22.8 pg — ABNORMAL LOW (ref 26.0–34.0)
MCHC: 32.9 g/dL (ref 30.0–36.0)
MCV: 69.3 fL — ABNORMAL LOW (ref 80.0–100.0)
Platelets: 268 10*3/uL (ref 150–400)
RBC: 5.53 MIL/uL (ref 4.22–5.81)
RDW: 15.8 % — ABNORMAL HIGH (ref 11.5–15.5)
WBC: 10.6 10*3/uL — ABNORMAL HIGH (ref 4.0–10.5)
nRBC: 0 % (ref 0.0–0.2)

## 2021-08-01 LAB — RESP PANEL BY RT-PCR (FLU A&B, COVID) ARPGX2
Influenza A by PCR: NEGATIVE
Influenza B by PCR: NEGATIVE
SARS Coronavirus 2 by RT PCR: NEGATIVE

## 2021-08-01 LAB — RAPID URINE DRUG SCREEN, HOSP PERFORMED
Amphetamines: NOT DETECTED
Barbiturates: NOT DETECTED
Benzodiazepines: NOT DETECTED
Cocaine: NOT DETECTED
Opiates: NOT DETECTED
Tetrahydrocannabinol: NOT DETECTED

## 2021-08-01 LAB — ETHANOL: Alcohol, Ethyl (B): <10 mg/dL (ref <10)

## 2021-08-01 MED ORDER — LORAZEPAM 1 MG PO TABS
ORAL_TABLET | ORAL | Status: AC
  Filled 2021-08-01: qty 2

## 2021-08-01 MED ORDER — LORAZEPAM 1 MG PO TABS
2.0000 mg | ORAL_TABLET | Freq: Once | ORAL | Status: AC
  Administered 2021-08-01: 2 mg via ORAL

## 2021-08-01 MED ORDER — NICOTINE 14 MG/24HR TD PT24
MEDICATED_PATCH | TRANSDERMAL | Status: AC
  Filled 2021-08-01: qty 1

## 2021-08-01 MED ORDER — NICOTINE 14 MG/24HR TD PT24
14.0000 mg | MEDICATED_PATCH | Freq: Once | TRANSDERMAL | Status: AC
  Administered 2021-08-01: 14 mg via TRANSDERMAL

## 2021-08-01 NOTE — Progress Notes (Signed)
TRIAGE: ROUTINE  Quintella Reichert, NP, reviewed pt's chart and information and met with pt face-to-face and determined pt does not meet inpatient criteria. Pt is being d/c with information for outpatient services and information on homeless shelters in the area.   08/01/21 0530  [place] Triage Screening (Walk-ins at [facility] only)  What Is the Reason for Your Visit/Call Today? Pt states, "I've seen some things happen. I seen a friend pass away - that was a while ago. In my mind, it's been repeating the same things _0  over. It's stuck now, though. There's a lot of fussing going on in my mind all the time." Pt shares he was living with his mother but that he no longer lives there and has been staying with friends. Pt denies SI or a hx of SI, any prior attempts to kill himself, or a plan to kill himself. He denies HI, AVH, NSSIB, access to guns/weapons, engagement with the legal system, or SA. He denies he has a therapist or a psychiatrist, though he states he had a psychiatrist in the past. Pt denies he is currently taking medications for mental health, though he states he did take them in the past.  How Long Has This Been Causing You Problems? 1 wk - 1 month  Have You Recently Had Any Thoughts About Hurting Yourself? No  Are You Planning to Commit Suicide/Harm Yourself At This time? No  Have you Recently Had Thoughts About [place]? No  Are You Planning To Harm Someone At This Time? No  Are you currently experiencing any auditory, visual or other hallucinations? No  Have You Used Any Alcohol or Drugs in the Past 24 Hours? No  Do you have any current medical co-morbidities that require immediate attention? No  Clinician description of patient physical appearance/behavior: Pt is casually dressed in an age-appropriate and weather-appropriate manner. He is able to identify his thoughts, feelings, and concerns. Pt answers the questions posed, though he expresses  confusion at times.  What Do You Feel Would Help You the Most Today? Treatment for Depression or other mood problem;Medication(s);Housing Assistance  If access to [facility] Urgent [facility] was not available, would you have sought care in the Emergency Department? Yes  Determination of Need Routine (7 days)  Options For Referral Medication Management;Outpatient Therapy

## 2021-08-01 NOTE — ED Triage Notes (Addendum)
Pt presents to ED BIB [employer]. Pt reports, "I'm just sick. I don't feel good." Pt reports trying to hurt himself but would not elaborate on plan. Reports he doesn't feel that way anymore. Per [employer] pt noncompliant with meds. Psych cleared at [facility] this am.

## 2021-08-01 NOTE — BH Assessment (Addendum)
Comprehensive Clinical Assessment (CCA) Screening, Triage and Referral Note  08/01/2021 Jim Dunn [id number] Disposition: Clinician discussed patient care with Jim Abts, PA.  He recommended inpatient care at a geropsych facility due to hx of dementia and to get patient started back on medications.  Clinician informed RN Jim Dunn of disposition recommendation via secure messaging.    Patient is cooperative and has good eye contact.  He is oriented x3.  Pt is a poor historian.  Pt is says his thinking is messed up when he is asked about A/V hallucinations.  He does not appear to be responding to internal stimuli during the assessment.  Pt appetite and sleep are WNL.    Pt has been in the EDs and [facility] frequently over the last month.  Pt's sister and mother were encouraged to look into guardianship for patient.  He even acknowledges he needs one.    Chief Complaint:  Chief Complaint  Patient presents with   IVC   Visit Diagnosis: Dementia; Schizoaffective d/o bipolar type  Patient Reported Information How did you hear about [place]? Legal System ([employer] brought pt in on IVC.)  What Is the Reason for Your Visit/Call Today? Pt was discharged from [facility] earlier in the day.  He arrived at [facility] on IVC this evening.  Pt says that he does not know when he left or where he went to after leaving [facility].  Pt says he was brought to [facility] "because I am not feeling good."  Pt says he has been off his medication.  He is currently saying he does not want to hurt himself.  Although earlier he told triage nurse he wanted to hurt himself but would not elaborate on a plan.  Pt is now saying he has never tried to harm himself.  Pt denies any HI.  When he was seen earlier this morning at [facility] he said "there is a lot of fussing going on in my mind all the time."  Now he says he "ignores them.".  Pt denying currently having any A/V hallucinations.  Pt says that medication does not help with hallucinations.  No  access to guns and no pending legal issues.  "My mind is messed up I already know that" he says.  "It is pretty messed up."  He acknowledges being in mother's home for a couple months.   She put him out of the house recently.  Clinician asked about whether he had a guardian he said no.  When asked if he felt he needed a guardian he said "Yeah because I do stupid stuff."  Pt asked patient if it was okay to talk to his sister, he said yes.  Sister said that patient will wander the streeets.  Mother participated in the phone call.  She said that patient used to take medications prescribed by [facility] in [place], [place].  She did not know how long ago that was.  Mother said he has been diagnosed with dementia in the past.  Pt has been in group homes in the past and has usually been evicted from them because he will smoke.  How Long Has This Been Causing You Problems? 1 wk - 1 month  What Do You Feel Would Help You the Most Today? Treatment for Depression or other mood problem   Have You Recently Had Any Thoughts About Hurting Yourself? No  Are You Planning to Commit Suicide/Harm Yourself At This time? No   Have you Recently Had Thoughts About Hurting Someone Jim Dunn?  No  Are You Planning to Harm Someone at This Time? No  Explanation: No data recorded  Have You Used Any Alcohol or Drugs in the Past 24 Hours? No  How Long Ago Did You Use Drugs or Alcohol? No data recorded What Did You Use and How Much? No data recorded  Do You Currently Have a Therapist/Psychiatrist? No  Name of Therapist/Psychiatrist: No data recorded  Have You Been Recently Discharged From Any Office Practice or Programs? No  Explanation of Discharge From Practice/Program: No data recorded   CCA Screening Triage Referral Assessment Type of Contact: Tele-Assessment  Telemedicine Service Delivery:   Is this Initial or Reassessment? Initial Assessment  Date Telepsych consult ordered in [facility]:  08/01/21  Time Telepsych  consult ordered in [facility]:  2111  Location of Assessment: [facility] ED  Provider Location: [facility]   Collateral Involvement: Jim Dunn, sister [phone]   Does Patient Have a Court Appointed Legal Guardian? No data recorded Name and Contact of Legal Guardian: No data recorded If Minor and Not Living with Parent(s), Who has Custody? No data recorded Is CPS involved or ever been involved? -- (uta)  Is APS involved or ever been involved? -- Jim Dunn)   Patient Determined To Be At Risk for Harm To Self or Others Based on Review of Patient Reported Information or Presenting Complaint? No  Method: No data recorded Availability of Means: No data recorded Intent: No data recorded Notification Required: No data recorded Additional Information for Danger to Others Potential: No data recorded Additional Comments for Danger to Others Potential: No data recorded Are There Guns or Other Weapons in Your Home? No data recorded Types of Guns/Weapons: No data recorded Are These Weapons Safely Secured?                            No data recorded Who Could Verify You Are Able To Have These Secured: No data recorded Do You Have any Outstanding Charges, Pending Court Dates, Parole/Probation? No data recorded Contacted To Inform of Risk of Harm To Self or Others: No data recorded  Does Patient Present under Involuntary Commitment? Yes  IVC Papers Initial File Date: 08/01/21   [place] of Residence: [place] Bast (Homeless in [place])   Patient Currently Receiving the Following Services: Medication Management   Determination of Need: Urgent (48 hours)   Options For Referral: Inpatient Hospitalization (Recommend a geropsych placement due to dementia dx.)   Discharge Disposition:     [place], LCAS

## 2021-08-01 NOTE — ED Provider Notes (Signed)
Emergency Medicine Provider Triage Evaluation Note  Joncarlo Friberg , a 55 y.o. male  was evaluated in triage.  Pt complains of IVC.  Patient was seen yesterday in the ED, seen earlier this morning by behavioral health.  Patient was put under involuntary commitment by the mother this afternoon due to concern she was going to hurt himself.  He is schizophrenic, has not been taking his medicine.  Patient denies any HI, SI, hallucinations, delusions.  When asked why he is here he states he needs "professional help".  When asked what he needs help with the says with his mind and his body..  Review of Systems  Positive: IVC Negative: above  Physical Exam  BP (!) 161/96   Pulse (!) 105   Temp 98.6 F (37 C)   Resp 18   SpO2 97%  Gen:   Awake, no distress   Resp:  Normal effort  MSK:   Moves extremities without difficulty  Other:  Patient is tachycardic, vitals are otherwise stable \  Medical Decision Making  Medically screening exam initiated at 9:09 PM.  Appropriate orders placed.  Ayden Hardwick was informed that the remainder of the evaluation will be completed by another provider, this initial triage assessment does not replace that evaluation, and the importance of remaining in the ED until their evaluation is complete.  Labs, tts   Theron Arista, [place] 08/01/21 2110    Lorre Nick, MD 08/02/21 [phone]

## 2021-08-01 NOTE — ED Notes (Signed)
TTS in progress 

## 2021-08-01 NOTE — ED Provider Notes (Signed)
Behavioral Health Urgent Care Medical Screening Exam  Patient Name: Jim Dunn MRN: [id number] Date of Evaluation: 08/01/21 Chief Complaint: My mind keeps repeating over and over Diagnosis:  Final diagnoses:  Schizophrenia, unspecified type (HCC)    History of Present illness: Jim Dunn is a 55 y.o. male with a history paranoid schizophrenia, dementia and posttraumatic stress disorder presenting unaccompanied voluntarily to [facility] for help because "my mind keeps repeating over and over and I need somewhere to live". When asked for clarification if he was hearing voices and patient stated no, asked if he was having racing thoughts and he stated yes. Patient denies any auditory or visual hallucinations at this time and also denies any SI or HI. Patient reports that he is not currently taking any medications at this time. Patient later stated that he had some medications at his mother's house but he was not sure of the names or who prescribed them. Patient stated that he would go to his mother's and pick up his medications. Patient denies any substance use or alcohol use at this time. Patient reports he was previously living with his mother, but he cannot live with his mother anymore and has been staying with friends for over a week. Please see note from 07/31/21 by Tilford Pillar regarding collateral information from his mother.  This is Jim Dunn's sixth ED  visit in the last four months. On evaluation Jim Dunn is alert oriented and in no apparent distress, sitting calmly, cooperative and pleasant does not appear to be responding to any internal or external stimuli and experiencing any delusions. Patient denies any suicidal or homicidal ideations at this time. Patient will be given community resources to follow up with outpatient mental health services and local homeless shelters. Patient verbalized understanding of teaching. Patient will discharged with community resources.      Psychiatric Specialty Exam  Presentation  General Appearance:Appropriate for Environment  Eye Contact:Fair  Speech:Clear and Coherent  Speech Volume:Normal  Handedness:Right   Mood and Affect  Mood:Anxious; Depressed  Affect:Congruent   Thought Process  Thought Processes:Coherent  Descriptions of Associations:Intact  Orientation:Full (Time, Place and Person)  Thought Content:Logical  Diagnosis of Schizophrenia or Schizoaffective disorder in past: Yes  Duration of Psychotic Symptoms: Greater than six months  Hallucinations:None  Ideas of Reference:None  Suicidal Thoughts:No  Homicidal Thoughts:No   Sensorium  Memory:Immediate Fair; Recent Fair; Remote Fair  Judgment:Fair  Insight:Fair   Executive Functions  Concentration:Fair  Attention Span:Fair  Recall:Fair  Fund of Knowledge:Fair  Language:Good   Psychomotor Activity  Psychomotor Activity:Normal   Assets  Assets:Desire for Improvement; Financial Resources/Insurance; Resilience; Social Support   Sleep  Sleep:Fair  Number of hours: -1   Nutritional Assessment (For OBS and FBC admissions only) Has the patient had a weight loss or gain of 10 pounds or more in the last 3 months?: No Has the patient had a decrease in food intake/or appetite?: No Does the patient have dental problems?: No Does the patient have eating habits or behaviors that may be indicators of an eating disorder including binging or inducing vomiting?: No Has the patient recently lost weight without trying?: 0 Has the patient been eating poorly because of a decreased appetite?: 0 Malnutrition Screening Tool Score: 0    Physical Exam: Physical Exam HENT:     Head: Normocephalic and atraumatic.     Right Ear: External ear normal.     Left Ear: External ear normal.     Nose: Nose normal.  Mouth/Throat:     Mouth: Mucous membranes are moist.  Eyes:     Pupils: Pupils are equal, round, and reactive to light.   Pulmonary:     Effort: Pulmonary effort is normal.  Abdominal:     General: Abdomen is flat.  Musculoskeletal:        General: Normal range of motion.     Cervical back: Normal range of motion.  Skin:    General: Skin is warm.  Neurological:     Mental Status: He is alert and oriented to person, place, and time.  Psychiatric:        Attention and Perception: Attention normal.        Mood and Affect: Mood is anxious. Affect is labile and blunt.        Speech: Speech normal.        Behavior: Behavior normal.        Thought Content: Thought content does not include homicidal or suicidal ideation. Thought content does not include homicidal or suicidal plan.        Cognition and Memory: Cognition normal.   Review of Systems  Constitutional: Negative.   HENT: Negative.    Eyes: Negative.   Respiratory: Negative.    Cardiovascular: Negative.   Gastrointestinal: Negative.   Musculoskeletal: Negative.   Skin: Negative.   Neurological: Negative.   Endo/Heme/Allergies: Negative.   Psychiatric/Behavioral:  The patient is nervous/anxious.   Blood pressure (!) 194/99, pulse 78, temperature 98.4 F (36.9 C), temperature source Oral, resp. rate 20, SpO2 98 %. There is no height or weight on file to calculate BMI.  Musculoskeletal: Strength & Muscle Tone: within normal limits Gait & Station: normal Patient leans: N/A   [facility] MSE Discharge Disposition for Follow up and Recommendations: Based on my evaluation the patient does not appear to have an emergency medical condition and can be discharged with resources and follow up care in outpatient services for Medication Management and Individual Therapy Patient provided a list of outpatient mental health providers and local homeless shelters.   Jasper Riling, NP 08/01/2021, 6:13 AM

## 2021-08-01 NOTE — Discharge Instructions (Addendum)

## 2021-08-01 NOTE — ED Notes (Addendum)
Pt out of bed requesting cigarette, stating he feels anxious, and wants to use the telephone. Pt notified of phone use rules and MD notified of anxiety, and nicotine patch requested.

## 2021-08-02 ENCOUNTER — Encounter (HOSPITAL_COMMUNITY): Payer: Self-pay | Admitting: Internal Medicine

## 2021-08-02 DIAGNOSIS — E871 Hypo-osmolality and hyponatremia: Secondary | ICD-10-CM

## 2021-08-02 LAB — BASIC METABOLIC PANEL
Anion gap: 12 (ref 5–15)
BUN: 5 mg/dL — ABNORMAL LOW (ref 6–20)
CO2: 21 mmol/L — ABNORMAL LOW (ref 22–32)
Calcium: 8.4 mg/dL — ABNORMAL LOW (ref 8.9–10.3)
Chloride: 88 mmol/L — ABNORMAL LOW (ref 98–111)
Creatinine, Ser: 0.69 mg/dL (ref 0.61–1.24)
GFR, Estimated: >60 mL/min (ref >60)
Glucose, Bld: 116 mg/dL — ABNORMAL HIGH (ref 70–99)
Potassium: 3.9 mmol/L (ref 3.5–5.1)
Sodium: 121 mmol/L — ABNORMAL LOW (ref 135–145)

## 2021-08-02 LAB — PHENYTOIN LEVEL, TOTAL: Phenytoin Lvl: <2.5 ug/mL — ABNORMAL LOW (ref 10.0–20.0)

## 2021-08-02 LAB — VALPROIC ACID LEVEL: Valproic Acid Lvl: <10 ug/mL — ABNORMAL LOW (ref 50.0–100.0)

## 2021-08-02 LAB — DIGOXIN LEVEL: Digoxin Level: <0.2 ng/mL — ABNORMAL LOW (ref 0.8–2.0)

## 2021-08-02 MED ORDER — DIVALPROEX SODIUM ER 500 MG PO TB24
1000.0000 mg | ORAL_TABLET | Freq: Every day | ORAL | Status: DC
  Administered 2021-08-03: 1000 mg via ORAL

## 2021-08-02 MED ORDER — BUSPIRONE HCL 10 MG PO TABS
ORAL_TABLET | ORAL | Status: AC
  Administered 2021-08-03: 10 mg via ORAL
  Filled 2021-08-02: qty 1

## 2021-08-02 MED ORDER — BUSPIRONE HCL 10 MG PO TABS
10.0000 mg | ORAL_TABLET | Freq: Three times a day (TID) | ORAL | Status: DC
  Administered 2021-08-02 – 2021-08-03 (×2): 10 mg via ORAL
  Filled 2021-08-02: qty 1

## 2021-08-02 MED ORDER — PALIPERIDONE ER 6 MG PO TB24
6.0000 mg | ORAL_TABLET | Freq: Every day | ORAL | Status: DC
  Administered 2021-08-03: 6 mg via ORAL

## 2021-08-02 MED ORDER — SACUBITRIL-VALSARTAN 24-26 MG PO TABS
1.0000 | ORAL_TABLET | Freq: Two times a day (BID) | ORAL | Status: DC
  Administered 2021-08-02 – 2021-08-03 (×2): 1 via ORAL
  Filled 2021-08-02 (×3): qty 1

## 2021-08-02 MED ORDER — PHENYTOIN SODIUM EXTENDED 100 MG PO CAPS
300.0000 mg | ORAL_CAPSULE | Freq: Two times a day (BID) | ORAL | Status: DC
  Administered 2021-08-02 – 2021-08-03 (×3): 300 mg via ORAL
  Filled 2021-08-02: qty 3

## 2021-08-02 MED ORDER — QUETIAPINE FUMARATE 200 MG PO TABS
200.0000 mg | ORAL_TABLET | Freq: Every day | ORAL | Status: DC
  Administered 2021-08-02: 200 mg via ORAL
  Filled 2021-08-02: qty 1

## 2021-08-02 MED ORDER — TRAZODONE HCL 100 MG PO TABS
ORAL_TABLET | ORAL | Status: AC
  Filled 2021-08-02: qty 2

## 2021-08-02 MED ORDER — DIGOXIN 125 MCG PO TABS
0.2500 mg | ORAL_TABLET | Freq: Every day | ORAL | Status: DC
  Administered 2021-08-02 – 2021-08-03 (×2): 0.25 mg via ORAL
  Filled 2021-08-02: qty 2

## 2021-08-02 MED ORDER — DIGOXIN 125 MCG PO TABS
ORAL_TABLET | ORAL | Status: AC
  Filled 2021-08-02: qty 2

## 2021-08-02 MED ORDER — GUAIFENESIN-DM 100-10 MG/5ML PO SYRP
10.0000 mL | ORAL_SOLUTION | Freq: Once | ORAL | Status: AC
  Administered 2021-08-02: 10 mL via ORAL
  Filled 2021-08-02: qty 10

## 2021-08-02 MED ORDER — DIAZEPAM 2 MG PO TABS
ORAL_TABLET | ORAL | Status: AC
  Administered 2021-08-03: 2 mg via ORAL
  Filled 2021-08-02: qty 1

## 2021-08-02 MED ORDER — PHENYTOIN SODIUM EXTENDED 100 MG PO CAPS
ORAL_CAPSULE | ORAL | Status: AC
  Filled 2021-08-02: qty 3

## 2021-08-02 MED ORDER — FUROSEMIDE 20 MG PO TABS
ORAL_TABLET | ORAL | Status: AC
  Administered 2021-08-03: 20 mg via ORAL
  Filled 2021-08-02: qty 1

## 2021-08-02 MED ORDER — QUETIAPINE FUMARATE 200 MG PO TABS
ORAL_TABLET | ORAL | Status: AC
  Administered 2021-08-02: 200 mg via ORAL
  Filled 2021-08-02: qty 1

## 2021-08-02 MED ORDER — FUROSEMIDE 20 MG PO TABS
20.0000 mg | ORAL_TABLET | Freq: Every day | ORAL | Status: DC
  Administered 2021-08-02: 20 mg via ORAL
  Filled 2021-08-02: qty 1

## 2021-08-02 MED ORDER — ATORVASTATIN CALCIUM 40 MG PO TABS
40.0000 mg | ORAL_TABLET | Freq: Every day | ORAL | Status: DC
  Administered 2021-08-02 – 2021-08-03 (×2): 40 mg via ORAL
  Filled 2021-08-02: qty 1

## 2021-08-02 MED ORDER — ATORVASTATIN CALCIUM 40 MG PO TABS
ORAL_TABLET | ORAL | Status: AC
  Filled 2021-08-02: qty 1

## 2021-08-02 MED ORDER — CHLORPROMAZINE HCL 100 MG PO TABS
100.0000 mg | ORAL_TABLET | Freq: Three times a day (TID) | ORAL | Status: DC
  Administered 2021-08-02 – 2021-08-03 (×2): 100 mg via ORAL
  Filled 2021-08-02 (×5): qty 1

## 2021-08-02 MED ORDER — DIAZEPAM 2 MG PO TABS
2.0000 mg | ORAL_TABLET | Freq: Two times a day (BID) | ORAL | Status: DC
  Administered 2021-08-02: 2 mg via ORAL
  Filled 2021-08-02: qty 1

## 2021-08-02 MED ORDER — TRAZODONE HCL 100 MG PO TABS
200.0000 mg | ORAL_TABLET | Freq: Every evening | ORAL | Status: DC | PRN
  Administered 2021-08-02: 200 mg via ORAL

## 2021-08-02 NOTE — Progress Notes (Addendum)
Follow-up with [facility] Inpatient Behavioral Health Placement   @8 :43pm CSW called for follow-up and spoke with their intake and was informed that their [facility] unit was full.  CSW informed that pt is on the list for review at [facility] per Dr. Dr. [facility] @8 :45pm on 08/02/21. CSW will continue to assist and follow pt with inpatient behavioral health placement.    , MSW, [facility] 08/02/2021 9:31 PM

## 2021-08-02 NOTE — Consult Note (Signed)
Medical Consultation   Shalin Drewek  [id number]  DOB: 21-Jun-1966  DOA: 08/01/2021  PCP: Merryl Hacker No    Outpatient Specialists: Ander Slade - pulmonlogy    Requesting physician: [place]  Reason for consultation: Hyponatremia, needs psych admission.  Requesting consult for hyponatremia.  Needs recs about hyponatremia.   History of Present Illness: Jacobb Mohiuddin is an 55 y.o. male with h/o chronic systolic CHF; COPD; paranoid schizophrenia; chronic multifactorial hyponatremia; and seizures presenting with mental illness.  He currently acknowledges this issue and reports AV hallucinations.  He desires inpatient BH treatment.  Meanwhile, he was noted to have hyponatremia and so medical consultation was requested.  He was hospitalized with hyponatremia with seizures in 6/21; it was thought to be multifactorial from intravascular dehydration, SIADH, and chronic systolic CHF with intermittent volume overload.  He reports chronic seizures unrelated to hyponatremia and thinks he had one since his last hospitalization; he acknowledges inconsistency in taking his Dilantin.  He has a frontal headache but no other current  complaints.  He has not been feeling confused.  He has not had any dietary changes recently or had excessive fluid intake.    Review of Systems:  ROS As per HPI otherwise review of systems negative.    Past Medical History: Past Medical History:  Diagnosis Date   CHF (congestive heart failure) ([place])    COPD (chronic obstructive pulmonary disease) (HCC)    History of kidney stones    Hyponatremia    Paranoid schizophrenia (HCC)    Seizures ([place])    Tobacco abuse     Past Surgical History: Past Surgical History:  Procedure Laterality Date   KIDNEY STONE SURGERY     MULTIPLE EXTRACTIONS WITH ALVEOLOPLASTY Bilateral 04/29/2020   Procedure: Extraction of tooth #'s 2-13, 17,18, 21-25, and 27-31 with alveoloplasty and bilateral lingual exostoses reductions.;   Surgeon: Lenn Cal, DDS;  Location: [place];  Service: Oral Surgery;  Laterality: Bilateral;     Allergies:  No Known Allergies   Social History:  reports that he has been smoking cigarettes. He has been smoking an average of 1 pack per day. His smokeless tobacco use includes chew. He reports that he does not currently use alcohol. He reports that he does not use drugs.   Family History: Family History  Problem Relation Age of Onset   Heart disease Other       Physical Exam: Vitals:   08/02/21 0119 08/02/21 0535 08/02/21 0900 08/02/21 1533  BP: (!) 165/90 (!) 149/84 (!) 147/85 138/79  Pulse: (!) 101 (!) 102 90 96  Resp: 20 13 20 19   Temp:   98.6 F (37 C) 98.6 F (37 C)  TempSrc:   Oral Oral  SpO2: 94% 98% 99% 97%    Constitutional: Alert and awake, oriented x3, not in any acute distress. Eyes:  EOMI, irises appear normal, anicteric sclera  ENMT: external ears and nose appear normal, normal hearing, Lips appear normal Neck: neck appears normal, no masses, normal ROM CVS: S1-S2 clear, no murmur rubs or gallops, no LE edema, normal pedal pulses  Respiratory:  clear to auscultation bilaterally, no wheezing, rales or rhonchi. Respiratory effort normal. No accessory muscle use.  Abdomen: soft nontender, nondistended Musculoskeletal: : no cyanosis, clubbing or edema noted bilaterally Neuro: Cranial nerves II-XII grossly intact Psych: judgement and insight appear reasonably normal, stable mood and affect currently Skin: no rashes or lesions or  ulcers, no induration or nodules    Data reviewed:  I have personally reviewed the recent labs and imaging studies  Pertinent Labs:   Na++ 121, stable from 11/28 Glucose 116 Dig negative Phenytoin negative Valproic acid negative COVID/flu negative   Inpatient Medications:   Scheduled Meds:  atorvastatin  40 mg Oral Daily   busPIRone  10 mg Oral TID   chlorproMAZINE  100 mg Oral TID   diazepam  2 mg Oral BID    digoxin  0.25 mg Oral Daily   divalproex  1,000 mg Oral QHS   furosemide  20 mg Oral Daily   nicotine  14 mg Transdermal Once   paliperidone  6 mg Oral QHS   phenytoin  300 mg Oral BID   QUEtiapine  200 mg Oral Daily   sacubitril-valsartan  1 tablet Oral BID   Continuous Infusions:   Radiological Exams on Admission: No results found.  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 -Because the patient is asymptomatic, this is likely chronic in nature and should not preclude psychiatric hospitalization when a bed is available -Will treat with fluid restriction of 1.2L day -This should be adjusted based on UOP with goal fluid intake of 500 cc less per day than his total urinary output -Suggest periodic monitoring -If fluid restriction is unsuccessful, could consider vaptan therapy   Schizophrenia -For inpt psych when a bed is available -Management per ER for now  Chronic systolic CHF -Appears to be compensated at this time -Continue Lipitor -Would consider stopping digoxin as this is a high-risk medication, particularly for a non-compliant patient -Continue Entresto, Lasix  COPD with ongoing tobacco dependence -Smoking cessation should be encouraged -Continue nicotine patch  Seizure d/o -Continue Dilantin, Depakote - although based on levels it appears the patient has not been taking  these    Thank you for this interesting consult.  TRH will sign off at this time.  Please re-consult if additional assistance is needed.    Time Spent: 50 minutes  Jonah Blue M.D. [facility] 08/02/2021, 6:28 PM

## 2021-08-02 NOTE — ED Provider Notes (Addendum)
Emergency Medicine Observation Re-evaluation Note  Jim Dunn is a 55 y.o. male, seen on rounds today.  Pt initially presented to the ED for complaints of behavioral health assessment.  Patient reports feeling improved today - no new c/o. Normal appetite, has eaten breakfast.   Physical Exam  BP (!) 149/84 (BP Location: Left Arm)   Pulse (!) 102   Temp 98.6 F (37 C)   Resp 13   SpO2 98%  Physical Exam General: alert, content, conversant, pleasant. Cardiac: regular rate Lungs: breathing comfortably Psych: calm, alert, conversant. Normal mood and affect. Pt does not appear acutely depressed or despondent. No thoughts of harm to self or others. Pt does not appear to be responding to internal stimuli - no delusions or hallucinations noted.   ED Course / MDM    I have reviewed the labs performed to date as well as medications administered while in observation.  Recent changes in the last 24 hours include ED obs, stabilization on meds, and ED reassessment.   Plan  Patient reports feeling improved. On exam this AM, no acute psychosis noted. Pt appears pleasant and conversant. Pt w no thoughts of harm to self or others. Pt expresses willingness to f/u with pcp and bh providers as outpatient.   Nursing note references earlier symptoms of feeling anxious and scared.   Will ask BH team to reassess today, as on review of old/prior charts, pts symptoms appear somewhat c/w prior. Pt also recommended for inpatient psych previously, but does not currently have any bh meds ordered - will ask pharmacy to prioritize med rec and TTS to comment on pts BH med management.   Also noted on earlier labs is low NA. Pt has been eating and drinking well today. Will recheck bmet. Of note, medicine d/c summary from last year notes hx chr chf and siadh, and baseline Na's in range 122-127.   Repeat Na 121. Other repeat labs/level pending.  Hospitalists consulted re  hyponatremia/management. As additional labs and  hospitalist consult pending - signed out to [facility] at 1520 to f/u on those issues.      Cathren Laine, MD 08/02/21 [phone]

## 2021-08-02 NOTE — Progress Notes (Addendum)
ADDENDUM  Patient information has been sent to [facility] via secure chat to review for potential admission. Currently under clinical review with attending. Patient has not yet been accepted at this time. Patient meets inpatient criteria per Melbourne Abts, PA.   Situation ongoing, CSW will continue to monitor and update note as more information becomes available.    Signed:  Corky Crafts, MSW, [place], Bridget Hartshorn 08/02/2021 9:54 AM    [facility].  Pt meets inpatient criteria per Melbourne Abts, PA.  CSW sent secure chat to [facility] physician Dr. Toni Amend to review.    Referral was sent to the following facilities;    Destination Service Provider Address Phone Fax  [facility]  [address] [place] [address] [phone]  [facility]  [address] [place] [address] [phone]  [facility]  [address] [place], [place] [address] [phone]  [facility]  [address] [place], [place] [address] [phone]  [facility]  [address] [place] [address] [phone]  [facility]  [address], [place] [address] [phone]  [facility]  [address] [place] [address] [phone]  [facility]  [address] [place] [phone]  [facility]  [address] [place] [address] [phone]  [facility]  [address], [place] [address] [phone]  [facility]  [address], [place] [address] [phone]  [facility]  [address], [place]  [place] [address] [phone]  [facility]  [address] [place] [address] [phone]  [facility]  [address] [place]., [place] [address] [phone]    Situation ongoing,  CSW will follow up.   Maryjean Ka, MSW, LCSWA 08/02/2021  @ 12:03 AM

## 2021-08-02 NOTE — Consult Note (Signed)
  Attempted to set up tele psych assessment several times but never received reply from patients nurse.  Will attempt again tomorrow if patient has been accepted to psychiatric hospital.    Seira Cody B. Arthurine Oleary, NP

## 2021-08-02 NOTE — ED Notes (Signed)
[facility] EXPIRES 08/08/21

## 2021-08-02 NOTE — ED Notes (Addendum)
Pt up and down from bed, appears to not retain information for extended periods and has required redirection, which he is responsive to, regarding plan of care and telephone use. For example, pt repeatedly came out of room stating he is scared of people and doesn't have anywhere to go and/or asks to use the phone. Pt was reminded that he is safe and is staying in room 52 tonight and can use the phone in the morning. Pt returned to room no longer concerned. This cycle repeated for approximately an hour, and at this time pt is resting in room.

## 2021-08-02 NOTE — ED Notes (Signed)
Breakfast order placed ?

## 2021-08-02 NOTE — ED Notes (Signed)
Attempted to collect blood by 2 staff members . Unable obtain blood.

## 2021-08-02 NOTE — ED Notes (Signed)
Lab to draw blood

## 2021-08-02 NOTE — ED Notes (Signed)
The patient's sitter informed this EMT that the patient was complaining of discomfort in his torso. The patient's sitter obtained the patient's vitals, all within normal limits. Audrey-RN was informed of the incident.

## 2021-08-03 ENCOUNTER — Telehealth (HOSPITAL_COMMUNITY): Payer: Self-pay

## 2021-08-03 ENCOUNTER — Encounter (HOSPITAL_COMMUNITY): Payer: Self-pay | Admitting: Internal Medicine

## 2021-08-03 DIAGNOSIS — R440 Auditory hallucinations: Secondary | ICD-10-CM | POA: Diagnosis not present

## 2021-08-03 DIAGNOSIS — F201 Disorganized schizophrenia: Secondary | ICD-10-CM

## 2021-08-03 MED ORDER — DIVALPROEX SODIUM ER 500 MG PO TB24: 1000.0000 mg | ORAL_TABLET | Freq: Every day | ORAL | 2 refills | Status: DC

## 2021-08-03 MED ORDER — PALIPERIDONE ER 6 MG PO TB24: 6.0000 mg | ORAL_TABLET | Freq: Every day | ORAL | 2 refills | Status: DC

## 2021-08-03 MED ORDER — BUSPIRONE HCL 10 MG PO TABS: 10.0000 mg | ORAL_TABLET | Freq: Three times a day (TID) | ORAL | 2 refills | Status: DC

## 2021-08-03 MED ORDER — NICOTINE 14 MG/24HR TD PT24: 14.0000 mg | MEDICATED_PATCH | Freq: Every day | TRANSDERMAL | 1 refills | Status: DC

## 2021-08-03 MED ORDER — ATORVASTATIN CALCIUM 40 MG PO TABS: 40.0000 mg | ORAL_TABLET | Freq: Every day | ORAL | 2 refills | Status: DC

## 2021-08-03 MED ORDER — SACUBITRIL-VALSARTAN 24-26 MG PO TABS
1.0000 | ORAL_TABLET | Freq: Two times a day (BID) | ORAL | 2 refills | Status: DC
Start: 2021-08-03 — End: 2022-04-07

## 2021-08-03 MED ORDER — QUETIAPINE FUMARATE 200 MG PO TABS
200.0000 mg | ORAL_TABLET | Freq: Every day | ORAL | 2 refills | Status: DC
Start: 2021-08-03 — End: 2022-01-11

## 2021-08-03 MED ORDER — DIAZEPAM 2 MG PO TABS: 2.0000 mg | ORAL_TABLET | Freq: Two times a day (BID) | ORAL | 0 refills | Status: DC

## 2021-08-03 MED ORDER — PHENYTOIN SODIUM EXTENDED 300 MG PO CAPS: 300.0000 mg | ORAL_CAPSULE | Freq: Two times a day (BID) | ORAL | 2 refills | Status: DC

## 2021-08-03 MED ORDER — DIGOXIN 250 MCG PO TABS: 0.2500 mg | ORAL_TABLET | Freq: Every day | ORAL | 2 refills | Status: DC

## 2021-08-03 MED ORDER — FUROSEMIDE 20 MG PO TABS
20.0000 mg | ORAL_TABLET | Freq: Every day | ORAL | 2 refills | Status: DC
Start: 2021-08-03 — End: 2022-04-07

## 2021-08-03 MED ORDER — CHLORPROMAZINE HCL 100 MG PO TABS: 100.0000 mg | ORAL_TABLET | Freq: Three times a day (TID) | ORAL | 1 refills | Status: DC

## 2021-08-03 NOTE — ED Notes (Signed)
Pt has remained in bed throughout the night. Pt was awakened for medication administration without difficulty. Frequent coughing spells occurred, pt reports this cough is normal for him.

## 2021-08-03 NOTE — ED Notes (Signed)
Up to chair, requesting crackers and soda, verbalizes, "guess I'll go back to my mother's house, I can stay there, don't know the address, but she drives, they'll come pick me up".

## 2021-08-03 NOTE — ED Notes (Signed)
Pending TOC completion for medications

## 2021-08-03 NOTE — ED Notes (Signed)
Alert, NAD, calm, interactive, resps e/u, speaking in clear complete sentences, steady gait, using phone, asking for a Bible.

## 2021-08-03 NOTE — Consult Note (Signed)
Telepsych Consultation   Reason for Consult:  IVC, auditory hallucinations Referring Physician:  Sherrill Raring, PA-C Location of Patient: [facility] ED Location of Provider: Other: [facility]  Patient Identification: Jim Dunn MRN:  [id number] Principal Diagnosis: Chronic hyponatremia Diagnosis:  Principal Problem:   Chronic hyponatremia Active Problems:   Chronic systolic CHF (congestive heart failure) ([place])   COPD (chronic obstructive pulmonary disease) ([place])   Seizure disorder ([place])   Disorganized schizophrenia ([place])   Total Time spent with patient: 30 minutes  Subjective:   Jim Dunn is a 55 y.o. male patient with a history of paranoid schizophrenia, dementia and posttraumatic stress disorder has had multiple urgent care and emergency room visits in the last month.  Discharging from one facility and going to another on the same day. Patient has been held for overnight observations on several occasions where he is ready to go the next day.   He has been given multiple resources to follow up with for medication management and counseling which he has failed to do.    Within the last week patient was seen at [facility] 11/24, 11/27 x 2 visits, 11/28.  On 11/28 after discharge from [facility] patient presented to [facility] ED where he was admitted.  Patient brought in by law enforcement and IVC petitioned by his mother with complaints that she had concerns that patient was going to hurt himself and that he was schizophrenic and not taking his medications.   HPI:  Jim Dunn, 55 y.o., male patient seen via tele health by this provider, consulted with Dr. Hampton Abbot; and chart reviewed on 08/03/21.  On evaluation Jim Dunn reports he came to the hospital because "It all started 11 yrs ago.  I've seen a lot and been through a lot and can't get over it.  I was hearing stuff in my mind, bad stuff I couldn't get over.  It's like a lot of negative stuff in my mind."   At this time patient states he is hearing  nothing in his mind.  He denies suicidal/self-harm/homicidal ideation, psychosis, and paranoia."  Patient Denies any prior suicide attempts.  Reports he is currently living with his mother but wants to find his own place and asked "Can you help me get my own place."  Informed that would order a social work/TOC consult that would give resources to assist in the process of what and where he needed to go for assistance in finding his own place.  Patient also states he has no outpatient psychiatric services and that his medications were prescribed by his primary care doctor prior to moving in with his mother a couple of weeks ago.  States he hasn't been taking his medications like he should "Because sometimes I forget."  States that he mother reminds him to take sometimes.  Patient also voices that he has no transportation to get him back and forth to his doctor appointments.  Also states he has difficulty getting medications filled and no longer has a primary care provider. Patient informed that social work could assist with resources to assist him but he would have to follow up with the resources given.   During evaluation Jim Dunn is laying in bed in no acute distress.  He is alert, oriented x 4, calm and cooperative.  His mood is depressed but stable with congruent affect.  He does not appear to be responding to internal/external stimuli or delusional thoughts.  Patient denies suicidal/self-harm/homicidal ideation, psychosis, and paranoia.  He reports he  is eating/sleeping without difficulty; and tolerating medications without adverse reaction.  Patient main concern seems to be wanting his own place to live.  Encourage to follow up with resources given by social work.     At this time Jim Dunn is educated and verbalizes understanding of mental health resources and other crisis services in the community. He is instructed to call 911 and present to the nearest emergency room should he experience any  suicidal/homicidal ideation, auditory/visual/hallucinations, or detrimental worsening of his mental health condition.   Past Psychiatric History: See below  Risk to Self:  Denies Risk to Others:  Denies Prior Inpatient Therapy:  Yes Prior Outpatient Therapy:  Yes  Past Medical History:  Past Medical History:  Diagnosis Date   CHF (congestive heart failure) ([place])    COPD (chronic obstructive pulmonary disease) ([place])    History of kidney stones    Hyponatremia    Paranoid schizophrenia ([place])    Seizures ([place])    Tobacco abuse     Past Surgical History:  Procedure Laterality Date   KIDNEY STONE SURGERY     MULTIPLE EXTRACTIONS WITH ALVEOLOPLASTY Bilateral 04/29/2020   Procedure: Extraction of tooth #'s 2-13, 17,18, 21-25, and 27-31 with alveoloplasty and bilateral lingual exostoses reductions.;  Surgeon: Lenn Cal, DDS;  Location: [place];  Service: Oral Surgery;  Laterality: Bilateral;   Family History:  Family History  Problem Relation Age of Onset   Heart disease Other    Family Psychiatric  History: None reported Social History:  Social History   Substance and Sexual Activity  Alcohol Use Not Currently     Social History   Substance and Sexual Activity  Drug Use Never    Social History   Socioeconomic History   Marital status: Single    Spouse name: Not on file   Number of children: Not on file   Years of education: Not on file   Highest education level: Not on file  Occupational History   Occupation: Disability  Tobacco Use   Smoking status: Every Day    Packs/day: 1.00    Types: Cigarettes   Smokeless tobacco: Current    Types: Chew   Tobacco comments:    2-3 cigarettes smoked daily 07/22/20 [facility]   Vaping Use   Vaping Use: Never used  Substance and Sexual Activity   Alcohol use: Not Currently   Drug use: Never   Sexual activity: Not Currently  Other Topics Concern   Not on file  Social History Narrative   Pt lives [facility].  He  receives outpatient psychiatric services through the ALF.  He stated that prior to moving there, he did not have a psychiatrist.     Social Determinants of Health   Financial Resource Strain: Not on file  Food Insecurity: Not on file  Transportation Needs: Not on file  Physical Activity: Not on file  Stress: Not on file  Social Connections: Not on file   Additional Social History:    Allergies:  No Known Allergies  Labs:  Results for orders placed or performed during the hospital encounter of 08/01/21 (from the past 48 hour(s))  Rapid urine drug screen (hospital performed)     Status: None   Collection Time: 08/01/21  8:58 PM  Result Value Ref Range   Opiates NONE DETECTED NONE DETECTED   Cocaine NONE DETECTED NONE DETECTED   Benzodiazepines NONE DETECTED NONE DETECTED   Amphetamines NONE DETECTED NONE DETECTED   Tetrahydrocannabinol NONE DETECTED NONE  DETECTED   Barbiturates NONE DETECTED NONE DETECTED    Comment: (NOTE) DRUG SCREEN FOR MEDICAL PURPOSES ONLY.  IF CONFIRMATION IS NEEDED FOR ANY PURPOSE, NOTIFY LAB WITHIN 5 DAYS.  LOWEST DETECTABLE LIMITS FOR URINE DRUG SCREEN Drug Class                     Cutoff (ng/mL) Amphetamine and metabolites    1000 Barbiturate and metabolites    200 Benzodiazepine                 [id number] Tricyclics and metabolites     300 Opiates and metabolites        300 Cocaine and metabolites        300 THC                            50 Performed at [facility], [place] [address], [place], [place] [address]   Ethanol     Status: None   Collection Time: 08/01/21  9:04 PM  Result Value Ref Range   Alcohol, Ethyl (B) <10 <10 mg/dL    Comment: (NOTE) Lowest detectable limit for serum alcohol is 10 mg/dL.  For medical purposes only. Performed at [facility], [place] [address], [place], [place] [address]   cbc     Status: Abnormal   Collection Time: 08/01/21  9:04 PM  Result Value Ref Range   WBC 10.6 (H) 4.0 - 10.5 K/uL   RBC  5.53 4.22 - 5.81 MIL/uL   Hemoglobin 12.6 (L) 13.0 - 17.0 g/dL   HCT 38.3 (L) 39.0 - 52.0 %   MCV 69.3 (L) 80.0 - 100.0 fL   MCH 22.8 (L) 26.0 - 34.0 pg   MCHC 32.9 30.0 - 36.0 g/dL   RDW 15.8 (H) 11.5 - 15.5 %   Platelets 268 150 - 400 K/uL    Comment: REPEATED TO VERIFY   nRBC 0.0 0.0 - 0.2 %    Comment: Performed at [facility], [place] [address], [place], [place] [address]  Resp Panel by RT-PCR (Flu A&B, Covid) Nasopharyngeal Swab     Status: None   Collection Time: 08/01/21  9:12 PM   Specimen: Nasopharyngeal Swab; Nasopharyngeal(NP) swabs in vial transport medium  Result Value Ref Range   SARS Coronavirus 2 by RT PCR NEGATIVE NEGATIVE    Comment: (NOTE) SARS-CoV-2 target nucleic acids are NOT DETECTED.  The SARS-CoV-2 RNA is generally detectable in upper respiratory specimens during the acute phase of infection. The lowest concentration of SARS-CoV-2 viral copies this assay can detect is 138 copies/mL. A negative result does not preclude SARS-Cov-2 infection and should not be used as the sole basis for treatment or other patient management decisions. A negative result may occur with  improper specimen collection/handling, submission of specimen other than nasopharyngeal swab, presence of viral mutation(s) within the areas targeted by this assay, and inadequate number of viral copies(<138 copies/mL). A negative result must be combined with clinical observations, patient history, and epidemiological information. The expected result is Negative.  Fact Sheet for Patients:  EntrepreneurPulse.com.au  Fact Sheet for Healthcare Providers:  IncredibleEmployment.be  This test is no t yet approved or cleared by the [place] FDA and  has been authorized for detection and/or diagnosis of SARS-CoV-2 by FDA under an Emergency Use Authorization (EUA). This EUA will remain  in effect (meaning this test can be used) for the duration of  the COVID-19 declaration under Section 564(b)(1) of the Act, 21 U.S.C.section 360bbb-3(b)(1), unless the authorization is terminated  or revoked sooner.       Influenza A by PCR NEGATIVE NEGATIVE   Influenza B by PCR NEGATIVE NEGATIVE    Comment: (NOTE) The Xpert Xpress SARS-CoV-2/FLU/RSV plus assay is intended as an aid in the diagnosis of influenza from Nasopharyngeal swab specimens and should not be used as a sole basis for treatment. Nasal washings and aspirates are unacceptable for Xpert Xpress SARS-CoV-2/FLU/RSV testing.  Fact Sheet for Patients: EntrepreneurPulse.com.au  Fact Sheet for Healthcare Providers: IncredibleEmployment.be  This test is not yet approved or cleared by the [place] FDA and has been authorized for detection and/or diagnosis of SARS-CoV-2 by FDA under an Emergency Use Authorization (EUA). This EUA will remain in effect (meaning this test can be used) for the duration of the COVID-19 declaration under Section 564(b)(1) of the Act, 21 U.S.C. section 360bbb-3(b)(1), unless the authorization is terminated or revoked.  Performed at [facility], [place] [address], [place], [place] [address]   Comprehensive metabolic panel     Status: Abnormal   Collection Time: 08/01/21  9:12 PM  Result Value Ref Range   Sodium 121 (L) 135 - 145 mmol/L   Potassium 4.1 3.5 - 5.1 mmol/L   Chloride 88 (L) 98 - 111 mmol/L   CO2 23 22 - 32 mmol/L   Glucose, Bld 97 70 - 99 mg/dL    Comment: Glucose reference range applies only to samples taken after fasting for at least 8 hours.   BUN 8 6 - 20 mg/dL   Creatinine, Ser 0.69 0.61 - 1.24 mg/dL   Calcium 9.0 8.9 - 10.3 mg/dL   Total Protein 7.0 6.5 - 8.1 g/dL   Albumin 3.9 3.5 - 5.0 g/dL   AST 41 15 - 41 U/L   ALT 16 0 - 44 U/L   Alkaline Phosphatase 98 38 - 126 U/L   Total Bilirubin 0.6 0.3 - 1.2 mg/dL   GFR, Estimated >60 >60 mL/min    Comment: (NOTE) Calculated using the  CKD-EPI Creatinine Equation (2021)    Anion gap 10 5 - 15    Comment: Performed at [facility] [address], [place], [place] [id number]  Basic metabolic panel     Status: Abnormal   Collection Time: 08/02/21  2:30 PM  Result Value Ref Range   Sodium 121 (L) 135 - 145 mmol/L   Potassium 3.9 3.5 - 5.1 mmol/L    Comment: SLIGHT HEMOLYSIS   Chloride 88 (L) 98 - 111 mmol/L   CO2 21 (L) 22 - 32 mmol/L   Glucose, Bld 116 (H) 70 - 99 mg/dL    Comment: Glucose reference range applies only to samples taken after fasting for at least 8 hours.   BUN 5 (L) 6 - 20 mg/dL   Creatinine, Ser 0.69 0.61 - 1.24 mg/dL   Calcium 8.4 (L) 8.9 - 10.3 mg/dL   GFR, Estimated >60 >60 mL/min    Comment: (NOTE) Calculated using the CKD-EPI Creatinine Equation (2021)    Anion gap 12 5 - 15    Comment: Performed at [facility] [address], [place], [place] [address]  Digoxin level     Status: Abnormal   Collection Time: 08/02/21  2:45 PM  Result Value Ref Range   Digoxin Level <0.2 (L) 0.8 - 2.0 ng/mL    Comment: RESULTS CONFIRMED BY MANUAL DILUTION Performed at [facility]  [facility], [place] [address] [place], [place] [address]   Phenytoin level, total     Status: Abnormal   Collection Time: 08/02/21  2:45 PM  Result Value Ref Range   Phenytoin Lvl <2.5 (L) 10.0 - 20.0 ug/mL    Comment: Performed at [facility] [address], [place], [place] [address]  Valproic acid level     Status: Abnormal   Collection Time: 08/02/21  2:45 PM  Result Value Ref Range   Valproic Acid Lvl <10 (L) 50.0 - 100.0 ug/mL    Comment: RESULTS CONFIRMED BY MANUAL DILUTION Performed at [facility] [address] [place], [place] [address]     Medications:  Current Facility-Administered Medications  Medication Dose Route Frequency Provider Last Rate Last Admin   atorvastatin (LIPITOR) tablet 40 mg  40 mg Oral Daily Lajean Saver, MD   40 mg at 08/03/21 1051   busPIRone (BUSPAR) tablet 10 mg   10 mg Oral TID Lajean Saver, MD   10 mg at 08/03/21 1051   chlorproMAZINE (THORAZINE) tablet 100 mg  100 mg Oral TID Lajean Saver, MD   100 mg at 08/03/21 1051   diazepam (VALIUM) tablet 2 mg  2 mg Oral BID Lajean Saver, MD   2 mg at 08/03/21 1052   digoxin (LANOXIN) tablet 0.25 mg  0.25 mg Oral Daily Lajean Saver, MD   0.25 mg at 08/03/21 1054   divalproex (DEPAKOTE ER) 24 hr tablet 1,000 mg  1,000 mg Oral QHS Lajean Saver, MD   1,000 mg at 08/03/21 0014   furosemide (LASIX) tablet 20 mg  20 mg Oral Daily Lajean Saver, MD   20 mg at 08/03/21 1053   paliperidone (INVEGA) 24 hr tablet 6 mg  6 mg Oral QHS Lajean Saver, MD   6 mg at 08/03/21 0015   phenytoin (DILANTIN) ER capsule 300 mg  300 mg Oral BID Lajean Saver, MD   300 mg at 08/03/21 1054   QUEtiapine (SEROQUEL) tablet 200 mg  200 mg Oral Daily Lajean Saver, MD   200 mg at 08/02/21 2008   sacubitril-valsartan (ENTRESTO) 24-26 mg per tablet  1 tablet Oral BID Lajean Saver, MD   1 tablet at 08/03/21 1054   traZODone (DESYREL) tablet 200 mg  200 mg Oral QHS PRN Lajean Saver, MD   200 mg at 08/02/21 [id number]   Current Outpatient Medications  Medication Sig Dispense Refill   acetaminophen (TYLENOL) 325 MG tablet Take 2 tablets (650 mg total) by mouth every 4 (four) hours as needed for mild pain or fever.     atorvastatin (LIPITOR) 40 MG tablet Take 1 tablet (40 mg total) by mouth daily. (Patient taking differently: Take 40 mg by mouth at bedtime.) 30 tablet 2   busPIRone (BUSPAR) 10 MG tablet Take 1 tablet (10 mg total) by mouth 3 (three) times daily. 90 tablet 2   chlorproMAZINE (THORAZINE) 100 MG tablet Take 100 mg by mouth 3 (three) times daily.     diazepam (VALIUM) 2 MG tablet Take 2 mg by mouth 2 (two) times daily.     digoxin (LANOXIN) 0.25 MG tablet Take 0.25 mg by mouth daily.     divalproex (DEPAKOTE ER) 500 MG 24 hr tablet Take 1,000 mg by mouth at bedtime.     furosemide (LASIX) 20 MG tablet Take 20 mg by mouth daily.      hydrOXYzine (ATARAX/VISTARIL) 25 MG tablet Take 25 mg by mouth 4 (four) times daily.  metoprolol succinate (TOPROL-XL) 100 MG 24 hr tablet Take 100 mg by mouth daily.     mirtazapine (REMERON) 30 MG tablet Take 30 mg by mouth at bedtime.     paliperidone (INVEGA) 6 MG 24 hr tablet Take 6 mg by mouth at bedtime.     phenytoin (DILANTIN) 300 MG ER capsule Take 1 capsule (300 mg total) by mouth 2 (two) times daily. 60 capsule 2   prazosin (MINIPRESS) 2 MG capsule Take 2 mg by mouth at bedtime.     QUEtiapine (SEROQUEL) 200 MG tablet Take 1 tablet (200 mg total) by mouth daily. 30 tablet 2   sacubitril-valsartan (ENTRESTO) 24-26 MG Take 1 tablet by mouth 2 (two) times daily. 60 tablet 2   traZODone (DESYREL) 100 MG tablet Take 200 mg by mouth at bedtime as needed for sleep.     INVEGA SUSTENNA 234 MG/1.5ML SUSY injection Inject 234 mg into the muscle every 30 (thirty) days. 1.8 mL 2    Musculoskeletal: Strength & Muscle Tone: within normal limits Gait & Station: normal Patient leans: N/A          Psychiatric Specialty Exam:  Presentation  General Appearance: Appropriate for Environment  Eye Contact:Good  Speech:Clear and Coherent; Normal Rate  Speech Volume:Normal  Handedness:Right   Mood and Affect  Mood:Depressed  Affect:Appropriate; Congruent   Thought Process  Thought Processes:Coherent; Goal Directed  Descriptions of Associations:Intact  Orientation:Full (Time, Place and Person)  Thought Content:WDL  History of Schizophrenia/Schizoaffective disorder:Yes  Duration of Psychotic Symptoms:Greater than six months  Hallucinations:Hallucinations: None Ideas of Reference:None  Suicidal Thoughts:Suicidal Thoughts: No Homicidal Thoughts:Homicidal Thoughts: No  Sensorium  Memory:Immediate Good; Recent Good  Judgment:Intact  Insight:Present   Executive Functions  Concentration:Good  Attention Span:Good  [place] of  Knowledge:Good  Language:Good   Psychomotor Activity  Psychomotor Activity:Psychomotor Activity: Normal  Assets  Assets:Communication Skills; Desire for Improvement; Financial Resources/Insurance; Housing; Social Support   Sleep  Sleep:Sleep: Good   Physical Exam: Physical Exam Vitals and nursing note reviewed. Exam conducted with a chaperone present.  Constitutional:      General: He is not in acute distress.    Appearance: Normal appearance. He is not ill-appearing.  Cardiovascular:     Rate and Rhythm: Normal rate.  Pulmonary:     Effort: Pulmonary effort is normal.  Neurological:     Mental Status: He is alert.  Psychiatric:        Attention and Perception: Attention and perception normal. He does not perceive auditory or visual hallucinations.        Mood and Affect: Affect normal. Mood is depressed (Stable).        Speech: Speech normal.        Behavior: Behavior normal. Behavior is cooperative.        Thought Content: Thought content normal. Thought content is not paranoid or delusional. Thought content does not include homicidal or suicidal ideation.        Cognition and Memory: Cognition and memory normal.        Judgment: Judgment normal.   Review of Systems  Constitutional: Negative.   HENT: Negative.    Eyes: Negative.   Respiratory: Negative.    Cardiovascular: Negative.   Gastrointestinal: Negative.   Genitourinary: Negative.   Musculoskeletal:  Positive for joint pain.  Skin: Negative.   Neurological: Negative.   Endo/Heme/Allergies: Negative.   Psychiatric/Behavioral:  Depression: Stable. Hallucinations: Denies at this time. Substance abuse: Denies drug and alcohol use. Suicidal ideas: Denies. The  patient does not have insomnia. Nervous/anxious: Stable.       Reports he is feeling better.  Wanting help finding his own place to live.  Also wanting to get primary care doctor and outpatient psychiatric services.    States he is able to go back to his  mothers house but will have to find his own place soon.  States he has no transportation.   Dunn pressure 139/82, pulse 77, temperature 98.8 F (37.1 C), temperature source Oral, resp. rate 20, SpO2 98 %. There is no height or weight on file to calculate BMI.  Treatment Plan Summary: Plan Psychiatrically clear to follow with resources given for outpatient psychiatric services and other community services and resources  Social work/TOC consult ordered also sent secure message to assist patient with:  Patient is feeling better.  He doesn't have a PCP discussed [facility] with him he will need resource information.  He doesn't have outpatient psychiatric services but will need for medication management and transportation.  He also wanted resource information on where he would need to go to get help finding a place to live will only be with mother for short period of time.  He doesn't have transportation to get to and from doctor's office so he will need resource information for that.    Suicide Risk Assessment Demographic Factors:  Male  Loss Factors: Recent move with mother losing his own place  Historical Factors: Impulsivity  Risk Reduction Factors:   Sense of responsibility to family, Religious beliefs about death, Living with another person, especially a relative, and Positive social support  Continued Clinical Symptoms:  Schizophrenia:   Paranoid or undifferentiated type  Cognitive Features That Contribute To Risk:  None    Suicide Risk:  Minimal: No identifiable suicidal ideation.  Patients presenting with no risk factors but with morbid ruminations; may be classified as minimal risk based on the severity of the depressive symptoms  Rescind IVC After thorough evaluation and review of information currently presented on assessment of Jim Dunn there is insufficient findings to indicate patient meets criteria for involuntary commitment or require an inpatient level of care.   Patient is alert/oriented x 4; calm/cooperative; and mood congruent with affect.  He is speaking in a clear tone at moderate volume, and normal pace, with good eye contact.  His thought process is coherent and relevant; There is no indication that the he is currently responding to internal/external stimuli or experiencing delusional thought content; and he has denied suicidal/self-harm/homicidal ideation, psychosis, and paranoia.  Patient has remained calm throughout assessment and has answered questions appropriately.  Currently he is not significantly impaired, psychotic, or manic on exam.  A detailed risk assessment has been completed based on clinical exam and individual risk factors.  Patient acute suicide risk is low.   Disposition: No evidence of imminent risk to self or others at present.   Patient does not meet criteria for psychiatric inpatient admission. Supportive therapy provided about ongoing stressors. Refer to IOP. Discussed crisis plan, support from social network, calling 911, coming to the Emergency Department, and calling Suicide Hotline.  This service was provided via telemedicine using a 2-way, interactive audio and video technology.  Names of all persons participating in this telemedicine service and their role in this encounter. Name: Assunta Found Role: NP  Name: Dr. Nelly Rout Role: Psychiatrist  Name: Jim Dunn Role: Patient  Name:  Role:    Secure Message sent to patient's nurse Dr. Jonah Blue, Ella Bodo, RN  and social work informing:  Patient psychiatric consult complete. Psychiatric cleared to follow up with resources provided by social work/TOC and behavioral health coordinator.  Patient will need prescription for his medications medical and psychotropics.  He also needs referral/resources for outpatient psychiatric services, primary care provider, transportation, financial assist, and housing that should have been added to his AVS by social work/TOC and  Artist.  Patient will need transportation assistance getting home.     Layali Freund, NP 08/03/2021 11:08 AM

## 2021-08-03 NOTE — Discharge Instructions (Addendum)
For your behavioral health needs you are advised to follow up with one of the providers listed below at your earliest opportunity:       [facility]      [address]      [place], [place] [address]      [phone]      New patients are seen at their walk-in clinic.  Walk-in hours are Monday - Friday from 8:30 am - 12:00 pm, and from 1:00 pm - 2:30 pm.  Walk-in patients are seen on a first come, first served basis, so try to arrive as early as possible for the best chance of being seen the same day.       [facility]      [address], [address]      [place], [place] [address]      [phone]    Community Resource Guide Shelters The [place] "B3979455" is a great source of information about community services available.  Access by dialing 2-1-1 from anywhere in [place], or by website -  PooledIncome.pl.    Other Local Resources (Updated 12/2015)   Shelters   Services    Phone Number and Address  [facility] Housing for homeless and needy men with substance abuse issues [phone] [place], [place]  [employer] [facility] [employer] Emergency assistance [employer] Pantry services [phone] [place], [place]  [facility] Domestic violence shelter for women and their children [phone] [place], [place]  [facility] Domestic violence shelter for women and their children [phone] [place], [place]  [facility] ([facility]) / Resources for the [employer] center for the homeless Information and referral to housing resources Counseling [employer] Phone bank Mailroom Computer lab Medical clinic Bike maintenance center [phone] [address] [place], [place]  [facility] [employer] [facility]  Emergency housing Food Emergency financial assistance Permanent supportive housing [phone] [address] [place], [place]    [employer]  Crisis assistance Medication Housing Food Utility assistance (541)[phone] [address] [place], [place]   [phone] [address] [place], [place]  [employer] of [place]       Transitional housing Case Chartered certified accountant assistance [phone] [address] [place], [place]  Lysle Morales, [employer] for adult men and women [phone] [address] [place], [place]  24-hour Crisis Line for those Facing Homelessness   Information and referral to community resources [phone]

## 2021-08-03 NOTE — ED Notes (Signed)
Breakfast order placed ?

## 2021-08-03 NOTE — BH Assessment (Signed)
Care Management - Follow Up Discharges   Writer attempted to make contact with patient today and was unsuccessful.  Writer left a HIPPA compliant voice message.   Per chart review, patient was provided with outpatient resources. Per chart review, the patient does not have a phone and the number listed is his mother's phone number.

## 2021-08-03 NOTE — ED Notes (Addendum)
SW speaking with pt, re: medications. Bus pass x2 given to pt for home and [facility] pharmacy

## 2021-08-03 NOTE — BH Assessment (Signed)
[facility] Assessment Progress Note   Per Shuvon Rankin, NP, this pt does not require psychiatric hospitalization at this time.  Pt is psychiatrically cleared.  Discharge instructions include referral information for [facility] and for [facility] for outpatient psychiatry.  Shuvon has been notified.  Doylene Canning, MA Triage Specialist [phone]

## 2021-08-03 NOTE — ED Notes (Signed)
Pt up from sleeping and to b/r. Steady gait. Back to room. TTS initiated, in progress.

## 2021-08-03 NOTE — Discharge Planning (Signed)
RNCM met with pt at bedside regarding home medication.  Pt states he gets his Rx filled at [facility] and would like to pick up there. RNCM spoke with EDP to confirm Rx ordered.  RNCM provided two bus passes for pt to retrieve medications and return home safely.

## 2021-08-03 NOTE — ED Notes (Signed)
TTS complete, meds given, snack eaten, sitter present.

## 2021-09-04 ENCOUNTER — Emergency Department (HOSPITAL_COMMUNITY)
Admission: EM | Admit: 2021-09-04 | Discharge: 2021-09-05 | Disposition: A | Discharge status: Home / Self Care | Payer: Medicare PPO | Attending: Emergency Medicine | Admitting: Emergency Medicine

## 2021-09-04 ENCOUNTER — Encounter (HOSPITAL_COMMUNITY): Payer: Self-pay | Admitting: Emergency Medicine

## 2021-09-04 ENCOUNTER — Other Ambulatory Visit: Payer: Self-pay

## 2021-09-04 DIAGNOSIS — F200 Paranoid schizophrenia: Secondary | ICD-10-CM | POA: Diagnosis not present

## 2021-09-04 DIAGNOSIS — E871 Hypo-osmolality and hyponatremia: Secondary | ICD-10-CM | POA: Diagnosis not present

## 2021-09-04 DIAGNOSIS — R45851 Suicidal ideations: Secondary | ICD-10-CM | POA: Insufficient documentation

## 2021-09-04 DIAGNOSIS — F201 Disorganized schizophrenia: Secondary | ICD-10-CM | POA: Diagnosis present

## 2021-09-04 DIAGNOSIS — Z20822 Contact with and (suspected) exposure to covid-19: Secondary | ICD-10-CM | POA: Diagnosis not present

## 2021-09-04 DIAGNOSIS — F600 Paranoid personality disorder: Secondary | ICD-10-CM | POA: Diagnosis not present

## 2021-09-04 DIAGNOSIS — F333 Major depressive disorder, recurrent, severe with psychotic symptoms: Secondary | ICD-10-CM | POA: Diagnosis present

## 2021-09-04 DIAGNOSIS — Z79899 Other long term (current) drug therapy: Secondary | ICD-10-CM | POA: Insufficient documentation

## 2021-09-04 LAB — CBC WITH DIFFERENTIAL/PLATELET
Abs Immature Granulocytes: 0.07 10*3/uL (ref 0.00–0.07)
Basophils Absolute: 0 10*3/uL (ref 0.0–0.1)
Basophils Relative: 0 %
Eosinophils Absolute: 0.1 10*3/uL (ref 0.0–0.5)
Eosinophils Relative: 1 %
HCT: 40.8 % (ref 39.0–52.0)
Hemoglobin: 13.7 g/dL (ref 13.0–17.0)
Immature Granulocytes: 1 %
Lymphocytes Relative: 23 %
Lymphs Abs: 2.3 10*3/uL (ref 0.7–4.0)
MCH: 22.6 pg — ABNORMAL LOW (ref 26.0–34.0)
MCHC: 33.6 g/dL (ref 30.0–36.0)
MCV: 67.3 fL — ABNORMAL LOW (ref 80.0–100.0)
Monocytes Absolute: 1 10*3/uL (ref 0.1–1.0)
Monocytes Relative: 10 %
Neutro Abs: 6.5 10*3/uL (ref 1.7–7.7)
Neutrophils Relative %: 65 %
Platelets: 196 10*3/uL (ref 150–400)
RBC: 6.06 MIL/uL — ABNORMAL HIGH (ref 4.22–5.81)
RDW: 15.8 % — ABNORMAL HIGH (ref 11.5–15.5)
WBC: 10 10*3/uL (ref 4.0–10.5)
nRBC: 0 % (ref 0.0–0.2)

## 2021-09-04 LAB — COMPREHENSIVE METABOLIC PANEL
ALT: 13 U/L (ref 0–44)
AST: 38 U/L (ref 15–41)
Albumin: 3.9 g/dL (ref 3.5–5.0)
Alkaline Phosphatase: 92 U/L (ref 38–126)
Anion gap: 13 (ref 5–15)
BUN: <5 mg/dL — ABNORMAL LOW (ref 6–20)
CO2: 23 mmol/L (ref 22–32)
Calcium: 9.2 mg/dL (ref 8.9–10.3)
Chloride: 87 mmol/L — ABNORMAL LOW (ref 98–111)
Creatinine, Ser: 0.6 mg/dL — ABNORMAL LOW (ref 0.61–1.24)
GFR, Estimated: >60 mL/min (ref >60)
Glucose, Bld: 95 mg/dL (ref 70–99)
Potassium: 3.7 mmol/L (ref 3.5–5.1)
Sodium: 123 mmol/L — ABNORMAL LOW (ref 135–145)
Total Bilirubin: 1.1 mg/dL (ref 0.3–1.2)
Total Protein: 7.5 g/dL (ref 6.5–8.1)

## 2021-09-04 LAB — ACETAMINOPHEN LEVEL: Acetaminophen (Tylenol), Serum: <10 ug/mL — ABNORMAL LOW (ref 10–30)

## 2021-09-04 LAB — RAPID URINE DRUG SCREEN, HOSP PERFORMED
Amphetamines: NOT DETECTED
Barbiturates: NOT DETECTED
Benzodiazepines: POSITIVE — AB
Cocaine: NOT DETECTED
Opiates: NOT DETECTED
Tetrahydrocannabinol: NOT DETECTED

## 2021-09-04 LAB — RESP PANEL BY RT-PCR (FLU A&B, COVID) ARPGX2
Influenza A by PCR: NEGATIVE
Influenza B by PCR: NEGATIVE
SARS Coronavirus 2 by RT PCR: NEGATIVE

## 2021-09-04 LAB — SALICYLATE LEVEL: Salicylate Lvl: <7 mg/dL — ABNORMAL LOW (ref 7.0–30.0)

## 2021-09-04 LAB — ETHANOL: Alcohol, Ethyl (B): <10 mg/dL (ref <10)

## 2021-09-04 MED ORDER — CHLORPROMAZINE HCL 100 MG PO TABS
100.0000 mg | ORAL_TABLET | Freq: Three times a day (TID) | ORAL | Status: DC
  Administered 2021-09-04 – 2021-09-05 (×2): 100 mg via ORAL
  Filled 2021-09-04: qty 4
  Filled 2021-09-04: qty 1
  Filled 2021-09-04: qty 4
  Filled 2021-09-04: qty 1

## 2021-09-04 MED ORDER — FUROSEMIDE 20 MG PO TABS
20.0000 mg | ORAL_TABLET | Freq: Every day | ORAL | Status: DC
  Administered 2021-09-04 – 2021-09-05 (×2): 20 mg via ORAL
  Filled 2021-09-04 (×2): qty 1

## 2021-09-04 MED ORDER — ATORVASTATIN CALCIUM 40 MG PO TABS: 40.0000 mg | ORAL_TABLET | Freq: Every day | ORAL | Status: DC

## 2021-09-04 MED ORDER — TRAZODONE HCL 100 MG PO TABS
200.0000 mg | ORAL_TABLET | Freq: Every evening | ORAL | Status: DC | PRN
  Administered 2021-09-04: 200 mg via ORAL
  Filled 2021-09-04: qty 4

## 2021-09-04 MED ORDER — PHENYTOIN SODIUM EXTENDED 100 MG PO CAPS
300.0000 mg | ORAL_CAPSULE | Freq: Two times a day (BID) | ORAL | Status: DC
  Administered 2021-09-04 – 2021-09-05 (×2): 300 mg via ORAL
  Filled 2021-09-04 (×2): qty 3

## 2021-09-04 MED ORDER — PALIPERIDONE ER 6 MG PO TB24
6.0000 mg | ORAL_TABLET | Freq: Every day | ORAL | Status: DC
  Administered 2021-09-04: 6 mg via ORAL
  Filled 2021-09-04 (×2): qty 1

## 2021-09-04 MED ORDER — PRAZOSIN HCL 2 MG PO CAPS
2.0000 mg | ORAL_CAPSULE | Freq: Every day | ORAL | Status: DC
  Administered 2021-09-04: 2 mg via ORAL
  Filled 2021-09-04 (×2): qty 1

## 2021-09-04 MED ORDER — DIVALPROEX SODIUM ER 500 MG PO TB24
1000.0000 mg | ORAL_TABLET | Freq: Every day | ORAL | Status: DC
  Administered 2021-09-04: 1000 mg via ORAL
  Filled 2021-09-04 (×2): qty 2

## 2021-09-04 MED ORDER — SACUBITRIL-VALSARTAN 24-26 MG PO TABS
1.0000 | ORAL_TABLET | Freq: Two times a day (BID) | ORAL | Status: DC
  Administered 2021-09-04 – 2021-09-05 (×2): 1 via ORAL
  Filled 2021-09-04 (×3): qty 1

## 2021-09-04 MED ORDER — QUETIAPINE FUMARATE 200 MG PO TABS
200.0000 mg | ORAL_TABLET | Freq: Every day | ORAL | Status: DC
  Administered 2021-09-04 – 2021-09-05 (×2): 200 mg via ORAL
  Filled 2021-09-04 (×3): qty 1

## 2021-09-04 MED ORDER — DIGOXIN 125 MCG PO TABS: 0.2500 mg | ORAL_TABLET | Freq: Every day | ORAL | Status: DC

## 2021-09-04 MED ORDER — ACETAMINOPHEN 325 MG PO TABS
650.0000 mg | ORAL_TABLET | ORAL | Status: DC | PRN
  Administered 2021-09-04: 650 mg via ORAL
  Filled 2021-09-04: qty 2

## 2021-09-04 MED ORDER — DIGOXIN 125 MCG PO TABS
0.2500 mg | ORAL_TABLET | Freq: Every day | ORAL | Status: DC
  Administered 2021-09-04 – 2021-09-05 (×2): 0.25 mg via ORAL
  Filled 2021-09-04 (×2): qty 2

## 2021-09-04 MED ORDER — ATORVASTATIN CALCIUM 40 MG PO TABS
40.0000 mg | ORAL_TABLET | Freq: Every day | ORAL | Status: DC
  Administered 2021-09-04 – 2021-09-05 (×2): 40 mg via ORAL
  Filled 2021-09-04 (×2): qty 1

## 2021-09-04 MED ORDER — MIRTAZAPINE 15 MG PO TABS
30.0000 mg | ORAL_TABLET | Freq: Every day | ORAL | Status: DC
  Administered 2021-09-04: 30 mg via ORAL
  Filled 2021-09-04 (×2): qty 1

## 2021-09-04 MED ORDER — CHLORPROMAZINE HCL 25 MG PO TABS: 100.0000 mg | ORAL_TABLET | Freq: Three times a day (TID) | ORAL | Status: DC

## 2021-09-04 MED ORDER — METOPROLOL SUCCINATE ER 25 MG PO TB24: 100.0000 mg | ORAL_TABLET | Freq: Every day | ORAL | Status: DC

## 2021-09-04 MED ORDER — BUSPIRONE HCL 10 MG PO TABS: 10.0000 mg | ORAL_TABLET | Freq: Three times a day (TID) | ORAL | Status: DC

## 2021-09-04 MED ORDER — PHENYTOIN SODIUM EXTENDED 100 MG PO CAPS: 300.0000 mg | ORAL_CAPSULE | Freq: Two times a day (BID) | ORAL | Status: DC

## 2021-09-04 MED ORDER — METOPROLOL SUCCINATE ER 25 MG PO TB24
100.0000 mg | ORAL_TABLET | Freq: Every day | ORAL | Status: DC
  Administered 2021-09-04 – 2021-09-05 (×2): 100 mg via ORAL
  Filled 2021-09-04 (×2): qty 4

## 2021-09-04 MED ORDER — NICOTINE 14 MG/24HR TD PT24
14.0000 mg | MEDICATED_PATCH | Freq: Every day | TRANSDERMAL | Status: DC
  Administered 2021-09-04 – 2021-09-05 (×2): 14 mg via TRANSDERMAL
  Filled 2021-09-04 (×2): qty 1

## 2021-09-04 MED ORDER — DIAZEPAM 2 MG PO TABS: 2.0000 mg | ORAL_TABLET | Freq: Two times a day (BID) | ORAL | Status: DC

## 2021-09-04 MED ORDER — DIAZEPAM 2 MG PO TABS
2.0000 mg | ORAL_TABLET | Freq: Two times a day (BID) | ORAL | Status: DC
  Administered 2021-09-04: 2 mg via ORAL
  Filled 2021-09-04 (×2): qty 1

## 2021-09-04 MED ORDER — BUSPIRONE HCL 10 MG PO TABS
10.0000 mg | ORAL_TABLET | Freq: Three times a day (TID) | ORAL | Status: DC
  Administered 2021-09-04 – 2021-09-05 (×3): 10 mg via ORAL
  Filled 2021-09-04 (×3): qty 1

## 2021-09-04 NOTE — ED Notes (Signed)
Pt belongings placed in locker number 6 

## 2021-09-04 NOTE — ED Notes (Signed)
Pt and sitter report restlessness. Pt states he usually take something for sleep. PRN trazodone given.

## 2021-09-04 NOTE — ED Triage Notes (Signed)
Pt to triage via [facility] from home.  [employer] was on scene and pt states his "brain hurts" and saying "the past will come back to haunt you".    Sister reports pt not taking any meds.  Pt states he saw something "bad" years ago and people picked on him about it.  States he is suicidal but denies suicidal plan.  Denies AH/VH.  States he just wants to be in a mental hospital.
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
## 2021-09-04 NOTE — ED Notes (Addendum)
Moved to green 4 for comfort. Given warm blanket and drink. Aox4 ambulates on own ability. Pt takes PO medications without incident. Sitter present.

## 2021-09-04 NOTE — BH Assessment (Signed)
TTS spoke with Conor RN, to put pt in a private room to complete TTS assessment.  Clinician to call the cart in ten minutes.

## 2021-09-04 NOTE — ED Provider Notes (Addendum)
[facility] EMERGENCY DEPARTMENT Provider Note   CSN: [id number] Arrival date & time: 09/04/21  1344     History  Chief Complaint  Patient presents with   Suicidal    Jim Dunn is a 56 y.o. male.  HPI  Patient presents to the ED with complaints of suicidal ideation.  Patient has no specific plan but he has been feeling depressed and has been thinking about harming himself.  Patient also has been hearing some voices .  patient states he does not feel safe and needs psychiatric help  Home Medications Prior to Admission medications   Medication Sig Start Date End Date Taking? Authorizing Provider  acetaminophen (TYLENOL) 325 MG tablet Take 2 tablets (650 mg total) by mouth every 4 (four) hours as needed for mild pain or fever. 06/07/20   Russella Dar, NP  atorvastatin (LIPITOR) 40 MG tablet Take 1 tablet (40 mg total) by mouth daily. 08/03/21   Blane Ohara, MD  busPIRone (BUSPAR) 10 MG tablet Take 1 tablet (10 mg total) by mouth 3 (three) times daily. 08/03/21   Blane Ohara, MD  chlorproMAZINE (THORAZINE) 100 MG tablet Take 1 tablet (100 mg total) by mouth 3 (three) times daily. 08/03/21   Blane Ohara, MD  diazepam (VALIUM) 2 MG tablet Take 1 tablet (2 mg total) by mouth 2 (two) times daily. 08/03/21   Blane Ohara, MD  digoxin (LANOXIN) 0.25 MG tablet Take 1 tablet (0.25 mg total) by mouth daily. 08/03/21   Blane Ohara, MD  divalproex (DEPAKOTE ER) 500 MG 24 hr tablet Take 2 tablets (1,000 mg total) by mouth at bedtime. 08/03/21   Blane Ohara, MD  furosemide (LASIX) 20 MG tablet Take 1 tablet (20 mg total) by mouth daily. 08/03/21   Blane Ohara, MD  hydrOXYzine (ATARAX/VISTARIL) 25 MG tablet Take 25 mg by mouth 4 (four) times daily. 06/27/21   [provider]  INVEGA SUSTENNA 234 MG/1.5ML SUSY injection Inject 234 mg into the muscle every 30 (thirty) days. 06/07/20   Russella Dar, NP  metoprolol succinate (TOPROL-XL) 100 MG 24 hr  tablet Take 100 mg by mouth daily. 07/22/21   [provider]  mirtazapine (REMERON) 30 MG tablet Take 30 mg by mouth at bedtime. 07/14/21   [provider]  nicotine (NICODERM CQ) 14 mg/24hr patch Place 1 patch (14 mg total) onto the skin daily. 08/03/21   Blane Ohara, MD  paliperidone (INVEGA) 6 MG 24 hr tablet Take 1 tablet (6 mg total) by mouth at bedtime. 08/03/21   Blane Ohara, MD  phenytoin (DILANTIN) 300 MG ER capsule Take 1 capsule (300 mg total) by mouth 2 (two) times daily. 08/03/21   Blane Ohara, MD  prazosin (MINIPRESS) 2 MG capsule Take 2 mg by mouth at bedtime. 06/08/21   [provider]  QUEtiapine (SEROQUEL) 200 MG tablet Take 1 tablet (200 mg total) by mouth daily. 08/03/21   Blane Ohara, MD  sacubitril-valsartan (ENTRESTO) 24-26 MG Take 1 tablet by mouth 2 (two) times daily. 08/03/21   Blane Ohara, MD  traZODone (DESYREL) 100 MG tablet Take 200 mg by mouth at bedtime as needed for sleep. 07/22/21   [provider]      Allergies    Patient has no known allergies.    Review of Systems   Review of Systems  Physical Exam Updated Vital Signs BP (!) 164/106 (BP Location: Right Arm)    Pulse (!) 113    Temp 98.9 F (37.2 C) (  Oral)    Resp 16    SpO2 95%  Physical Exam Vitals and nursing note reviewed.  Constitutional:      General: He is not in acute distress.    Appearance: He is well-developed.  HENT:     Head: Normocephalic and atraumatic.     Right Ear: External ear normal.     Left Ear: External ear normal.  Eyes:     General: No scleral icterus.       Right eye: No discharge.        Left eye: No discharge.     Conjunctiva/sclera: Conjunctivae normal.  Neck:     Trachea: No tracheal deviation.  Cardiovascular:     Rate and Rhythm: Normal rate.  Pulmonary:     Effort: Pulmonary effort is normal. No respiratory distress.     Breath sounds: No stridor.  Abdominal:     General: There is no distension.   Musculoskeletal:        General: No swelling or deformity.     Cervical back: Neck supple.  Skin:    General: Skin is warm and dry.     Findings: No rash.  Neurological:     Mental Status: He is alert.     Cranial Nerves: Cranial nerve deficit: no gross deficits.  Psychiatric:        Mood and Affect: Mood is depressed.        Speech: Speech is not tangential.        Behavior: Behavior is slowed and withdrawn.        Thought Content: Thought content includes suicidal ideation.    ED Results / Procedures / Treatments   Labs (all labs ordered are listed, but only abnormal results are displayed) Labs Reviewed  COMPREHENSIVE METABOLIC PANEL - Abnormal; Notable for the following components:      Result Value   Sodium 123 (*)    Chloride 87 (*)    BUN <5 (*)    Creatinine, Ser 0.60 (*)    All other components within normal limits  RAPID URINE DRUG SCREEN, HOSP PERFORMED - Abnormal; Notable for the following components:   Benzodiazepines POSITIVE (*)    All other components within normal limits  CBC WITH DIFFERENTIAL/PLATELET - Abnormal; Notable for the following components:   RBC 6.06 (*)    MCV 67.3 (*)    MCH 22.6 (*)    RDW 15.8 (*)    All other components within normal limits  SALICYLATE LEVEL - Abnormal; Notable for the following components:   Salicylate Lvl <7.0 (*)    All other components within normal limits  ACETAMINOPHEN LEVEL - Abnormal; Notable for the following components:   Acetaminophen (Tylenol), Serum <10 (*)    All other components within normal limits  RESP PANEL BY RT-PCR (FLU A&B, COVID) ARPGX2  ETHANOL    EKG EKG Interpretation  Date/Time:  Sunday September 04 2021 15:24:22 EST Ventricular Rate:  100 PR Interval:  184 QRS Duration: 96 QT Interval:  392 QTC Calculation: 505 R Axis:   34 Text Interpretation: Normal sinus rhythm Possible Left atrial enlargement Minimal voltage criteria for LVH, may be normal variant ( Cornell product ) Prolonged QT  Abnormal ECG When compared with ECG of 01-Aug-2021 20:57, No significant change was found Confirmed by Linwood Dibbles [phone]) on 09/04/2021 3:29:12 PM  Radiology No results found.  Procedures Procedures    Medications Ordered in ED Medications  acetaminophen (TYLENOL) tablet 650 mg (has no administration in time  range)  atorvastatin (LIPITOR) tablet 40 mg (has no administration in time range)  busPIRone (BUSPAR) tablet 10 mg (has no administration in time range)  chlorproMAZINE (THORAZINE) tablet 100 mg (has no administration in time range)  diazepam (VALIUM) tablet 2 mg (has no administration in time range)  digoxin (LANOXIN) tablet 0.25 mg (has no administration in time range)  divalproex (DEPAKOTE ER) 24 hr tablet 1,000 mg (has no administration in time range)  furosemide (LASIX) tablet 20 mg (has no administration in time range)  mirtazapine (REMERON) tablet 30 mg (has no administration in time range)  metoprolol succinate (TOPROL-XL) 24 hr tablet 100 mg (has no administration in time range)  nicotine (NICODERM CQ - dosed in mg/24 hours) patch 14 mg (has no administration in time range)  paliperidone (INVEGA) 24 hr tablet 6 mg (has no administration in time range)  phenytoin (DILANTIN) ER capsule 300 mg (has no administration in time range)  prazosin (MINIPRESS) capsule 2 mg (has no administration in time range)  QUEtiapine (SEROQUEL) tablet 200 mg (has no administration in time range)  sacubitril-valsartan (ENTRESTO) 24-26 mg per tablet (has no administration in time range)  traZODone (DESYREL) tablet 200 mg (has no administration in time range)    ED Course/ Medical Decision Making/ A&P Clinical Course as of 09/04/21 1546  Sun Sep 04, 2021  1527 Sodium decreased at 123 but similar to previous values [JK]  1527 Covid and flu are negative. [JK]  1528 CBC unremarkable [JK]    Clinical Course User Index [JK] Linwood Dibbles, MD                           Medical Decision Making  The  patient has been placed in psychiatric observation due to the need to provide a safe environment for the patient while obtaining psychiatric consultation and evaluation, as well as ongoing medical and medication management to treat the patient's condition.  The patient has not been placed under full IVC at this time.  Patient presents with complaints of suicidal ideation.  He does have a history of mental health problems.  Laboratory tests show chronic hyponatremia but this is unchanged from baseline.  Patient is medically cleared for psychiatric evaluation        Final Clinical Impression(s) / ED Diagnoses Final diagnoses:  Suicidal ideation  Chronic hyponatremia    Rx / DC Orders ED Discharge Orders     None         Linwood Dibbles, MD 09/04/21 1547 Overnight observation recommended by psychiatry   Linwood Dibbles, MD 09/04/21 [phone]

## 2021-09-04 NOTE — ED Provider Notes (Signed)
Emergency Medicine Provider Triage Evaluation Note  Jim Dunn , a 56 y.o. male  was evaluated in triage.  Pt complains of suicidal ideations.  He denies specific plan for suicide, no access to weapons reported by patient.  No HI.  Patient reports he has been hearing some voices that are trying to trick him.  He reports he just needs to get help because he does not feel safe with himself.  He denies any medical complaints.  Review of Systems  Positive: SI Negative: Fevers, chest pain, shortness of breath, abdominal pain, nausea, vomiting, diarrhea, HI  Physical Exam  BP (!) 164/106 (BP Location: Right Arm)    Pulse (!) 113    Temp 98.9 F (37.2 C) (Oral)    Resp 16    SpO2 95%  Gen:   Awake, no distress   Resp:  Normal effort  MSK:   Moves extremities without difficulty  Other:    Medical Decision Making  Medically screening exam initiated at 1:52 PM.  Appropriate orders placed.  Beaux Wedemeyer was informed that the remainder of the evaluation will be completed by another provider, this initial triage assessment does not replace that evaluation, and the importance of remaining in the ED until their evaluation is complete.     Dartha Lodge, PA-C 09/04/21 1400    Linwood Dibbles, MD 09/04/21 2112

## 2021-09-04 NOTE — ED Notes (Signed)
This RN asks Pt permission to call mother for update. Pt agrees. Mother called and update on Pt status given.

## 2021-09-04 NOTE — ED Notes (Signed)
Pt gold chain necklace placed in locker 6 with other belongings.

## 2021-09-05 ENCOUNTER — Encounter (HOSPITAL_COMMUNITY): Payer: Self-pay | Admitting: Registered Nurse

## 2021-09-05 DIAGNOSIS — F200 Paranoid schizophrenia: Secondary | ICD-10-CM

## 2021-09-05 DIAGNOSIS — R45851 Suicidal ideations: Secondary | ICD-10-CM

## 2021-09-05 MED ORDER — ALPRAZOLAM 0.25 MG PO TABS
0.5000 mg | ORAL_TABLET | Freq: Once | ORAL | Status: AC
  Administered 2021-09-05: 0.5 mg via ORAL
  Filled 2021-09-05: qty 2

## 2021-09-05 NOTE — ED Provider Notes (Signed)
Behavioral health cleared the patient at this time.  No need for admission.  Patient notified and will be discharged at this time.  Medically cleared yesterday.  I personally discussed this with RN, Louie Catahimican   Natash Berman A, PA-C 09/05/21 1557    Gloris Manchester, MD 09/07/21 0600

## 2021-09-05 NOTE — BH Assessment (Addendum)
[facility] Assessment Progress Note   Per Shuvon Rankin, NP, this pt does not require psychiatric hospitalization at this time.  Pt is psychiatrically cleared.  Discharge instructions advise pt to continue treatment with his current outpatient provider.  Pt's nurse, Magnus Ivan, has been notified.  Doylene Canning, MA Triage Specialist [phone]

## 2021-09-05 NOTE — ED Notes (Signed)
Pt alseep. No signs of distress noted.

## 2021-09-05 NOTE — ED Notes (Signed)
Pt is calm and cooperative and is currently reading his Bible

## 2021-09-05 NOTE — Consult Note (Signed)
Telepsych Consultation   Reason for Consult:  Suicidal ideation Referring Physician:  Janet Berlin Location of Patient: [facility] ED Location of Provider: Other: [facility]  Patient Identification: Jim Dunn MRN:  [id number] Principal Diagnosis: Paranoid schizophrenia ([place]) Diagnosis:  Principal Problem:   Paranoid schizophrenia ([place]) Active Problems:   Disorganized schizophrenia ([place])   Total Time spent with patient: 30 minutes  Subjective:   Jim Dunn is a 55 y.o. male patient admitted with [facility] ED after presenting via EMS and reporting a history of schizophrenia and anxiety and complaints of auditory hallucinations with voices telling him they are going to kill him.  HPI:  Jim Dunn, 56 y.o., male patient seen via tele health by this provider, consulted with Dr. Ernie Hew; and chart reviewed on 09/05/21.  On evaluation Jim Dunn reports he came to the hospital because he had ran out of his blood pressure medication and :the medicine that help me think."  Patient is unable to give the name of the medication used to help and think "I take about 20 medications is hard to remember the name of all of."  Patient reports that he is not homeless that he is currently living with his mother.  At this time patient is denying suicidal/self-harm/homicidal ideation, auditory/visual hallucinations, and paranoia.  Patient gives permission to speak to his mother for collateral information Jim Dunn phone number [phone].. During evaluation Jim Dunn had to be awoken from sleep several time and then had to sit up in bed to avoid him falling back to sleep.  There is no distress noted. He is alert/oriented x 4; calm/cooperative; and mood congruent with affect.  He is speaking in a clear tone at moderate volume, and normal pace; with good eye contact.  His thought process is coherent and relevant; There is no indication that he is currently responding to internal/external stimuli or  experiencing delusional thought content; and he denies suicidal/self-harm/homicidal ideation, psychosis, and paranoia.  Patient also denies no problem with eating or sleeping.  Reports he is tolerating his medications without any adverse reaction only came because he ran out of medications 1 being his blood pressure medicine and another medication may have something.  Patient on multiple psychotropic medications difficult to determine which one it could be that he is out of the.   Collateral Information:  Jim Dunn phone number [phone].  There was no answer and unable to leave a voice message related to mailbox being full.  Past Psychiatric History: Paranoid schizophrenia, Disorganized schizophrenia   Risk to Self: Denies Risk to Others: Denies Prior Inpatient Therapy: Yes Prior Outpatient Therapy: Yes  Past Medical History:  Past Medical History:  Diagnosis Date   CHF (congestive heart failure) (HCC)    COPD (chronic obstructive pulmonary disease) ([place])    History of kidney stones    Hyponatremia    Paranoid schizophrenia ([place])    Seizures ([place])    Tobacco abuse     Past Surgical History:  Procedure Laterality Date   KIDNEY STONE SURGERY     MULTIPLE EXTRACTIONS WITH ALVEOLOPLASTY Bilateral 04/29/2020   Procedure: Extraction of tooth #'s 2-13, 17,18, 21-25, and 27-31 with alveoloplasty and bilateral lingual exostoses reductions.;  Surgeon: Lenn Cal, DDS;  Location: [facility];  Service: Oral Surgery;  Laterality: Bilateral;   Family History:  Family History  Problem Relation Age of Onset   Heart disease Other    Family Psychiatric  History: None reported Social History:  Social History   Substance  and Sexual Activity  Alcohol Use Not Currently     Social History   Substance and Sexual Activity  Drug Use Never    Social History   Socioeconomic History   Marital status: Single    Spouse name: Not on file   Number of children: Not on file   Years of  education: Not on file   Highest education level: Not on file  Occupational History   Occupation: Disability  Tobacco Use   Smoking status: Every Day    Packs/day: 1.00    Types: Cigarettes   Smokeless tobacco: Current    Types: Chew   Tobacco comments:    2-3 cigarettes smoked daily 07/22/20 [facility]   Vaping Use   Vaping Use: Never used  Substance and Sexual Activity   Alcohol use: Not Currently   Drug use: Never   Sexual activity: Not Currently  Other Topics Concern   Not on file  Social History Narrative   Pt lives [facility].  He receives outpatient psychiatric services through the ALF.  He stated that prior to moving there, he did not have a psychiatrist.     Social Determinants of Health   Financial Resource Strain: Not on file  Food Insecurity: Not on file  Transportation Needs: Not on file  Physical Activity: Not on file  Stress: Not on file  Social Connections: Not on file   Additional Social History:    Allergies:  No Known Allergies  Labs:  Results for orders placed or performed during the hospital encounter of 09/04/21 (from the past 48 hour(s))  Resp Panel by RT-PCR (Flu A&B, Covid) Nasopharyngeal Swab     Status: None   Collection Time: 09/04/21  1:59 PM   Specimen: Nasopharyngeal Swab; Nasopharyngeal(NP) swabs in vial transport medium  Result Value Ref Range   SARS Coronavirus 2 by RT PCR NEGATIVE NEGATIVE    Comment: (NOTE) SARS-CoV-2 target nucleic acids are NOT DETECTED.  The SARS-CoV-2 RNA is generally detectable in upper respiratory specimens during the acute phase of infection. The lowest concentration of SARS-CoV-2 viral copies this assay can detect is 138 copies/mL. A negative result does not preclude SARS-Cov-2 infection and should not be used as the sole basis for treatment or other patient management decisions. A negative result may occur with  improper specimen collection/handling, submission of specimen other than nasopharyngeal  swab, presence of viral mutation(s) within the areas targeted by this assay, and inadequate number of viral copies(<138 copies/mL). A negative result must be combined with clinical observations, patient history, and epidemiological information. The expected result is Negative.  Fact Sheet for Patients:  BloggerCourse.com  Fact Sheet for Healthcare Providers:  SeriousBroker.it  This test is no t yet approved or cleared by the [place] FDA and  has been authorized for detection and/or diagnosis of SARS-CoV-2 by FDA under an Emergency Use Authorization (EUA). This EUA will remain  in effect (meaning this test can be used) for the duration of the COVID-19 declaration under Section 564(b)(1) of the Act, 21 U.S.C.section 360bbb-3(b)(1), unless the authorization is terminated  or revoked sooner.       Influenza A by PCR NEGATIVE NEGATIVE   Influenza B by PCR NEGATIVE NEGATIVE    Comment: (NOTE) The Xpert Xpress SARS-CoV-2/FLU/RSV plus assay is intended as an aid in the diagnosis of influenza from Nasopharyngeal swab specimens and should not be used as a sole basis for treatment. Nasal washings and aspirates are unacceptable for Xpert Xpress SARS-CoV-2/FLU/RSV testing.  Fact Sheet for Patients: BloggerCourse.com  Fact Sheet for Healthcare Providers: SeriousBroker.it  This test is not yet approved or cleared by the [place] FDA and has been authorized for detection and/or diagnosis of SARS-CoV-2 by FDA under an Emergency Use Authorization (EUA). This EUA will remain in effect (meaning this test can be used) for the duration of the COVID-19 declaration under Section 564(b)(1) of the Act, 21 U.S.C. section 360bbb-3(b)(1), unless the authorization is terminated or revoked.  Performed at [facility], [address], [place], [place] [address]   Comprehensive metabolic  panel     Status: Abnormal   Collection Time: 09/04/21  1:59 PM  Result Value Ref Range   Sodium 123 (L) 135 - 145 mmol/L   Potassium 3.7 3.5 - 5.1 mmol/L   Chloride 87 (L) 98 - 111 mmol/L   CO2 23 22 - 32 mmol/L   Glucose, Bld 95 70 - 99 mg/dL    Comment: Glucose reference range applies only to samples taken after fasting for at least 8 hours.   BUN <5 (L) 6 - 20 mg/dL   Creatinine, Ser 9.37 (L) 0.61 - 1.24 mg/dL   Calcium 9.2 8.9 - 90.2 mg/dL   Total Protein 7.5 6.5 - 8.1 g/dL   Albumin 3.9 3.5 - 5.0 g/dL   AST 38 15 - 41 U/L   ALT 13 0 - 44 U/L   Alkaline Phosphatase 92 38 - 126 U/L   Total Bilirubin 1.1 0.3 - 1.2 mg/dL   GFR, Estimated >40 >97 mL/min    Comment: (NOTE) Calculated using the CKD-EPI Creatinine Equation (2021)    Anion gap 13 5 - 15    Comment: Performed at [facility], [address] [place], [place] [address]  Ethanol     Status: None   Collection Time: 09/04/21  1:59 PM  Result Value Ref Range   Alcohol, Ethyl (B) <10 <10 mg/dL    Comment: (NOTE) Lowest detectable limit for serum alcohol is 10 mg/dL.  For medical purposes only. Performed at [facility], [address], [place], [place] [address]   Urine rapid drug screen (hosp performed)     Status: Abnormal   Collection Time: 09/04/21  1:59 PM  Result Value Ref Range   Opiates NONE DETECTED NONE DETECTED   Cocaine NONE DETECTED NONE DETECTED   Benzodiazepines POSITIVE (A) NONE DETECTED   Amphetamines NONE DETECTED NONE DETECTED   Tetrahydrocannabinol NONE DETECTED NONE DETECTED   Barbiturates NONE DETECTED NONE DETECTED    Comment: (NOTE) DRUG SCREEN FOR MEDICAL PURPOSES ONLY.  IF CONFIRMATION IS NEEDED FOR ANY PURPOSE, NOTIFY LAB WITHIN 5 DAYS.  LOWEST DETECTABLE LIMITS FOR URINE DRUG SCREEN Drug Class                     Cutoff (ng/mL) Amphetamine and metabolites    1000 Barbiturate and metabolites    200 Benzodiazepine                 200 Tricyclics and metabolites      300 Opiates and metabolites        300 Cocaine and metabolites        300 THC                            50 Performed at [facility], [address], [place], [place] [address]   CBC with Diff  Status: Abnormal   Collection Time: 09/04/21  1:59 PM  Result Value Ref Range   WBC 10.0 4.0 - 10.5 K/uL   RBC 6.06 (H) 4.22 - 5.81 MIL/uL   Hemoglobin 13.7 13.0 - 17.0 g/dL   HCT 40.8 39.0 - 52.0 %   MCV 67.3 (L) 80.0 - 100.0 fL   MCH 22.6 (L) 26.0 - 34.0 pg   MCHC 33.6 30.0 - 36.0 g/dL   RDW 15.8 (H) 11.5 - 15.5 %   Platelets 196 150 - 400 K/uL    Comment: REPEATED TO VERIFY   nRBC 0.0 0.0 - 0.2 %   Neutrophils Relative % 65 %   Neutro Abs 6.5 1.7 - 7.7 K/uL   Lymphocytes Relative 23 %   Lymphs Abs 2.3 0.7 - 4.0 K/uL   Monocytes Relative 10 %   Monocytes Absolute 1.0 0.1 - 1.0 K/uL   Eosinophils Relative 1 %   Eosinophils Absolute 0.1 0.0 - 0.5 K/uL   Basophils Relative 0 %   Basophils Absolute 0.0 0.0 - 0.1 K/uL   Immature Granulocytes 1 %   Abs Immature Granulocytes 0.07 0.00 - 0.07 K/uL    Comment: Performed at [facility], [address] [place], [place] [id number]  Salicylate level     Status: Abnormal   Collection Time: 09/04/21  1:59 PM  Result Value Ref Range   Salicylate Lvl [id number] (L) 7.0 - 30.0 mg/dL    Comment: Performed at [facility] [address], [place], [place] [address]  Acetaminophen level     Status: Abnormal   Collection Time: 09/04/21  1:59 PM  Result Value Ref Range   Acetaminophen (Tylenol), Serum <10 (L) 10 - 30 ug/mL    Comment: (NOTE) Therapeutic concentrations vary significantly. A range of 10-30 ug/mL  may be an effective concentration for many patients. However, some  are best treated at concentrations outside of this range. Acetaminophen concentrations >150 ug/mL at 4 hours after ingestion  and >50 ug/mL at 12 hours after ingestion are often associated with  toxic reactions.  Performed at [facility], [place] [address], [place], [place] [address]     Medications:  Current Facility-Administered Medications  Medication Dose Route Frequency Provider Last Rate Last Admin   acetaminophen (TYLENOL) tablet 650 mg  650 mg Oral Q4H PRN Dorie Rank, MD   650 mg at 09/04/21 1718   atorvastatin (LIPITOR) tablet 40 mg  40 mg Oral Daily Heloise Purpura, RPH   40 mg at 09/05/21 [id number]   busPIRone (BUSPAR) tablet 10 mg  10 mg Oral TID Heloise Purpura, RPH   10 mg at 09/05/21 [id number]   chlorproMAZINE (THORAZINE) tablet 100 mg  100 mg Oral TID Heloise Purpura, RPH   100 mg at 09/05/21 [id number]   diazepam (VALIUM) tablet 2 mg  2 mg Oral BID Heloise Purpura, RPH   2 mg at 09/04/21 1718   digoxin (LANOXIN) tablet 0.25 mg  0.25 mg Oral Daily Heloise Purpura, RPH   0.25 mg at 09/05/21 [id number]   divalproex (DEPAKOTE ER) 24 hr tablet 1,000 mg  1,000 mg Oral QHS Dorie Rank, MD   1,000 mg at 09/04/21 2217   furosemide (LASIX) tablet 20 mg  20 mg Oral Daily Dorie Rank, MD   20 mg at 09/05/21 [id number]   metoprolol succinate (TOPROL-XL) 24 hr tablet 100 mg  100 mg Oral Daily Heloise Purpura, RPH   100 mg at  09/05/21 0922   mirtazapine (REMERON) tablet 30 mg  30 mg Oral QHS Dorie Rank, MD   30 mg at 09/04/21 2216   nicotine (NICODERM CQ - dosed in mg/24 hours) patch 14 mg  14 mg Transdermal Daily Dorie Rank, MD   14 mg at 09/05/21 0920   paliperidone (INVEGA) 24 hr tablet 6 mg  6 mg Oral QHS Dorie Rank, MD   6 mg at 09/04/21 2217   phenytoin (DILANTIN) ER capsule 300 mg  300 mg Oral BID Heloise Purpura, RPH   300 mg at 09/05/21 [id number]   prazosin (MINIPRESS) capsule 2 mg  2 mg Oral QHS Dorie Rank, MD   2 mg at 09/04/21 2216   QUEtiapine (SEROQUEL) tablet 200 mg  200 mg Oral Daily Dorie Rank, MD   200 mg at 09/05/21 0935   sacubitril-valsartan (ENTRESTO) 24-26 mg per tablet  1 tablet Oral BID Dorie Rank, MD   1 tablet at 09/05/21 [id number]   traZODone (DESYREL) tablet 200 mg  200 mg Oral QHS PRN Dorie Rank, MD   200 mg at 09/04/21 2318    Current Outpatient Medications  Medication Sig Dispense Refill   fluticasone furoate-vilanterol (BREO ELLIPTA) 200-25 MCG/ACT AEPB Inhale 1 puff into the lungs daily.     acetaminophen (TYLENOL) 325 MG tablet Take 2 tablets (650 mg total) by mouth every 4 (four) hours as needed for mild pain or fever.     atorvastatin (LIPITOR) 40 MG tablet Take 1 tablet (40 mg total) by mouth daily. 30 tablet 2   busPIRone (BUSPAR) 10 MG tablet Take 1 tablet (10 mg total) by mouth 3 (three) times daily. 90 tablet 2   chlorproMAZINE (THORAZINE) 100 MG tablet Take 1 tablet (100 mg total) by mouth 3 (three) times daily. 90 tablet 1   diazepam (VALIUM) 2 MG tablet Take 1 tablet (2 mg total) by mouth 2 (two) times daily. 60 tablet 0   digoxin (LANOXIN) 0.25 MG tablet Take 1 tablet (0.25 mg total) by mouth daily. 30 tablet 2   divalproex (DEPAKOTE ER) 500 MG 24 hr tablet Take 2 tablets (1,000 mg total) by mouth at bedtime. 60 tablet 2   furosemide (LASIX) 20 MG tablet Take 1 tablet (20 mg total) by mouth daily. 30 tablet 2   hydrOXYzine (ATARAX/VISTARIL) 25 MG tablet Take 25 mg by mouth 4 (four) times daily.     INVEGA SUSTENNA 234 MG/1.5ML SUSY injection Inject 234 mg into the muscle every 30 (thirty) days. 1.8 mL 2   metoprolol succinate (TOPROL-XL) 100 MG 24 hr tablet Take 100 mg by mouth daily.     mirtazapine (REMERON) 30 MG tablet Take 30 mg by mouth at bedtime.     nicotine (NICODERM CQ) 14 mg/24hr patch Place 1 patch (14 mg total) onto the skin daily. 28 patch 1   paliperidone (INVEGA) 6 MG 24 hr tablet Take 1 tablet (6 mg total) by mouth at bedtime. 30 tablet 2   phenytoin (DILANTIN) 300 MG ER capsule Take 1 capsule (300 mg total) by mouth 2 (two) times daily. 60 capsule 2   prazosin (MINIPRESS) 2 MG capsule Take 2 mg by mouth at bedtime.     QUEtiapine (SEROQUEL) 200 MG tablet Take 1 tablet (200 mg total) by mouth daily. 30 tablet 2   sacubitril-valsartan (ENTRESTO) 24-26 MG Take 1 tablet by mouth 2  (two) times daily. 60 tablet 2   traZODone (DESYREL) 100 MG tablet Take 200 mg by mouth at bedtime  as needed for sleep.      Musculoskeletal: Strength & Muscle Tone: within normal limits Gait & Station: normal Patient leans: N/A          Psychiatric Specialty Exam:  Presentation  General Appearance: Appropriate for Environment  Eye Contact:Good  Speech:Clear and Coherent  Speech Volume:Normal  Handedness:Right   Mood and Affect  Mood:Anxious  Affect:Appropriate; Congruent   Thought Process  Thought Processes:Coherent; Goal Directed  Descriptions of Associations:Intact  Orientation:Full (Time, Place and Person)  Thought Content:Logical  History of Schizophrenia/Schizoaffective disorder:Yes  Duration of Psychotic Symptoms:Greater than six months  Hallucinations:Hallucinations: None  Ideas of Reference:None  Suicidal Thoughts:Suicidal Thoughts: No  Homicidal Thoughts:Homicidal Thoughts: No   Sensorium  Memory:Immediate Good; Recent Good  Judgment:Intact  Insight:Present   Executive Functions  Concentration:Good  Attention Span:Good  Ryan of Knowledge:Good  Language:Good   Psychomotor Activity  Psychomotor Activity:Psychomotor Activity: Normal  Assets  Assets:Communication Skills; Desire for Improvement; Financial Resources/Insurance; Housing; Social Support   Sleep  Sleep:Sleep: Good   Physical Exam: Physical Exam Vitals and nursing note reviewed. Exam conducted with a chaperone present.  Constitutional:      General: He is not in acute distress.    Appearance: Normal appearance. He is not ill-appearing.  Cardiovascular:     Rate and Rhythm: Normal rate.     Comments: Elevated blood pressure Pulmonary:     Effort: Pulmonary effort is normal.  Neurological:     Mental Status: He is alert and oriented to person, place, and time.  Psychiatric:        Attention and Perception: Attention normal. He does not  perceive auditory or visual hallucinations.        Mood and Affect: Mood and affect normal.        Speech: Speech normal.        Behavior: Behavior normal. Behavior is cooperative.        Thought Content: Thought content normal. Thought content is not paranoid or delusional. Thought content does not include homicidal or suicidal ideation.        Judgment: Judgment is impulsive.   Review of Systems  Constitutional: Negative.   HENT: Negative.    Eyes: Negative.   Respiratory: Negative.    Cardiovascular:  Negative for chest pain and palpitations.  Gastrointestinal: Negative.   Genitourinary: Negative.   Musculoskeletal: Negative.   Skin: Negative.   Neurological: Negative.  Negative for headaches.  Endo/Heme/Allergies: Negative.   Psychiatric/Behavioral:  Depression: Stable. Hallucinations: Denies at this time. Substance abuse: Denies. Suicidal ideas: Denies. Nervous/anxious: Stable. Insomnia: Denies.        Patient reporting that he ran out of his medication for his blood pressure and the ones that help him think.  Patient reports that he is currently living with his mother who helps him with his medications.  Blood pressure (!) 165/119, pulse 89, temperature 98.7 F (37.1 C), temperature source Axillary, resp. rate 17, SpO2 97 %. There is no height or weight on file to calculate BMI.  Treatment Plan Summary: Patient is alert and oriented x 4.  Patient's conversation is relevant.  He denies suicidal/self-harm/homicidal ideations, psychosis, and paranoia.  He does not appear to be responding to internal/external stimuli or delusional thinking.  Reporting he is his own guardian.  Also reporting only came to the emergency room because he was out of his medication.  There is no criteria that indicates the patient is animate risk to himself or others.  Plan follow-up with outpatient psychiatric  services for medication management  Disposition: No evidence of imminent risk to self or others at  present.   Patient does not meet criteria for psychiatric inpatient admission. Discussed crisis plan, support from social network, calling 911, coming to the Emergency Department, and calling Suicide Hotline.  This service was provided via telemedicine using a 2-way, interactive audio and video technology.  Names of all persons participating in this telemedicine service and their role in this encounter. Name: Jim Dunn Role: NP  Name: Jim Dunn Role: Patient  Name:  Role:   Name:  Role:    Secure message sent to patient's nurse Ralph Leyden, RN informing: Psychiatric consult complete and patient psychiatrically cleared.  Patient can follow-up with his current outpatient psychiatric provider for medication management.  Unable to reach patient's mother related to voicemail box being full.  Patient reporting he is his own guardian but may need assistance with transportation getting home since he was boarding in by EMS.  Please inform MD only default listed.   Abdulkarim Eberlin, NP 09/05/2021 1:13 PM

## 2021-09-05 NOTE — ED Notes (Signed)
Pt reports feeling anxious and states that he is ready to leave. This RN asked what she can do for him. Pt asking for meds to help calm his nerves. Dr. Wilkie Aye aware.

## 2021-09-05 NOTE — Discharge Instructions (Signed)
For your behavioral health needs you are advised to continue treatment with your current outpatient provider.   

## 2021-09-05 NOTE — ED Notes (Signed)
Breakfast Orders Placed °

## 2021-09-11 ENCOUNTER — Encounter (HOSPITAL_COMMUNITY): Payer: Self-pay | Admitting: Emergency Medicine

## 2021-09-11 ENCOUNTER — Emergency Department (HOSPITAL_COMMUNITY)
Admission: EM | Admit: 2021-09-11 | Discharge: 2021-09-12 | Disposition: A | Discharge status: Home / Self Care | Payer: Medicare PPO | Attending: Emergency Medicine | Admitting: Emergency Medicine

## 2021-09-11 ENCOUNTER — Ambulatory Visit (INDEPENDENT_AMBULATORY_CARE_PROVIDER_SITE_OTHER)
Admission: EM | Admit: 2021-09-11 | Discharge: 2021-09-11 | Disposition: A | Discharge status: Home / Self Care | Payer: Medicare PPO | Source: Home / Self Care

## 2021-09-11 DIAGNOSIS — Z9114 Patient's other noncompliance with medication regimen: Secondary | ICD-10-CM | POA: Insufficient documentation

## 2021-09-11 DIAGNOSIS — R451 Restlessness and agitation: Secondary | ICD-10-CM | POA: Diagnosis present

## 2021-09-11 DIAGNOSIS — Z20822 Contact with and (suspected) exposure to covid-19: Secondary | ICD-10-CM | POA: Diagnosis not present

## 2021-09-11 DIAGNOSIS — F201 Disorganized schizophrenia: Secondary | ICD-10-CM | POA: Diagnosis not present

## 2021-09-11 DIAGNOSIS — F209 Schizophrenia, unspecified: Secondary | ICD-10-CM | POA: Diagnosis not present

## 2021-09-11 DIAGNOSIS — I509 Heart failure, unspecified: Secondary | ICD-10-CM | POA: Diagnosis not present

## 2021-09-11 DIAGNOSIS — Z789 Other specified health status: Secondary | ICD-10-CM | POA: Diagnosis present

## 2021-09-11 DIAGNOSIS — Z742 Need for assistance at home and no other household member able to render care: Secondary | ICD-10-CM | POA: Diagnosis not present

## 2021-09-11 DIAGNOSIS — F200 Paranoid schizophrenia: Secondary | ICD-10-CM | POA: Insufficient documentation

## 2021-09-11 DIAGNOSIS — J449 Chronic obstructive pulmonary disease, unspecified: Secondary | ICD-10-CM | POA: Diagnosis not present

## 2021-09-11 DIAGNOSIS — E871 Hypo-osmolality and hyponatremia: Secondary | ICD-10-CM

## 2021-09-11 LAB — ETHANOL: Alcohol, Ethyl (B): <10 mg/dL (ref <10)

## 2021-09-11 LAB — COMPREHENSIVE METABOLIC PANEL
ALT: 12 U/L (ref 0–44)
AST: 31 U/L (ref 15–41)
Albumin: 4 g/dL (ref 3.5–5.0)
Alkaline Phosphatase: 98 U/L (ref 38–126)
Anion gap: 13 (ref 5–15)
BUN: 6 mg/dL (ref 6–20)
CO2: 23 mmol/L (ref 22–32)
Calcium: 9.3 mg/dL (ref 8.9–10.3)
Chloride: 86 mmol/L — ABNORMAL LOW (ref 98–111)
Creatinine, Ser: 0.52 mg/dL — ABNORMAL LOW (ref 0.61–1.24)
GFR, Estimated: >60 mL/min (ref >60)
Glucose, Bld: 103 mg/dL — ABNORMAL HIGH (ref 70–99)
Potassium: 4.3 mmol/L (ref 3.5–5.1)
Sodium: 122 mmol/L — ABNORMAL LOW (ref 135–145)
Total Bilirubin: 1.5 mg/dL — ABNORMAL HIGH (ref 0.3–1.2)
Total Protein: 7.3 g/dL (ref 6.5–8.1)

## 2021-09-11 LAB — CBC WITH DIFFERENTIAL/PLATELET
Abs Immature Granulocytes: 0.03 10*3/uL (ref 0.00–0.07)
Basophils Absolute: 0 10*3/uL (ref 0.0–0.1)
Basophils Relative: 0 %
Eosinophils Absolute: 0.1 10*3/uL (ref 0.0–0.5)
Eosinophils Relative: 1 %
HCT: 41.2 % (ref 39.0–52.0)
Hemoglobin: 14 g/dL (ref 13.0–17.0)
Immature Granulocytes: 0 %
Lymphocytes Relative: 31 %
Lymphs Abs: 2.7 10*3/uL (ref 0.7–4.0)
MCH: 23.1 pg — ABNORMAL LOW (ref 26.0–34.0)
MCHC: 34 g/dL (ref 30.0–36.0)
MCV: 68.1 fL — ABNORMAL LOW (ref 80.0–100.0)
Monocytes Absolute: 1.1 10*3/uL — ABNORMAL HIGH (ref 0.1–1.0)
Monocytes Relative: 12 %
Neutro Abs: 4.9 10*3/uL (ref 1.7–7.7)
Neutrophils Relative %: 56 %
Platelets: 202 10*3/uL (ref 150–400)
RBC: 6.05 MIL/uL — ABNORMAL HIGH (ref 4.22–5.81)
RDW: 15.2 % (ref 11.5–15.5)
WBC: 8.9 10*3/uL (ref 4.0–10.5)
nRBC: 0 % (ref 0.0–0.2)

## 2021-09-11 LAB — RESP PANEL BY RT-PCR (FLU A&B, COVID) ARPGX2
Influenza A by PCR: NEGATIVE
Influenza B by PCR: NEGATIVE
SARS Coronavirus 2 by RT PCR: NEGATIVE

## 2021-09-11 MED ORDER — CHLORPROMAZINE HCL 25 MG PO TABS
100.0000 mg | ORAL_TABLET | Freq: Three times a day (TID) | ORAL | Status: DC
  Administered 2021-09-11 – 2021-09-12 (×3): 100 mg via ORAL
  Filled 2021-09-11 (×3): qty 4

## 2021-09-11 MED ORDER — QUETIAPINE FUMARATE 200 MG PO TABS
200.0000 mg | ORAL_TABLET | Freq: Every day | ORAL | Status: DC
  Administered 2021-09-11 – 2021-09-12 (×2): 200 mg via ORAL
  Filled 2021-09-11 (×3): qty 1

## 2021-09-11 MED ORDER — BUSPIRONE HCL 10 MG PO TABS
10.0000 mg | ORAL_TABLET | Freq: Three times a day (TID) | ORAL | Status: DC
  Administered 2021-09-11 – 2021-09-12 (×4): 10 mg via ORAL
  Filled 2021-09-11 (×4): qty 1

## 2021-09-11 MED ORDER — SACUBITRIL-VALSARTAN 24-26 MG PO TABS
1.0000 | ORAL_TABLET | Freq: Two times a day (BID) | ORAL | Status: DC
  Administered 2021-09-11 – 2021-09-12 (×2): 1 via ORAL
  Filled 2021-09-11 (×2): qty 1

## 2021-09-11 MED ORDER — DIGOXIN 125 MCG PO TABS
0.2500 mg | ORAL_TABLET | Freq: Every day | ORAL | Status: DC
  Administered 2021-09-12: 0.25 mg via ORAL
  Filled 2021-09-11: qty 2

## 2021-09-11 MED ORDER — DIGOXIN 125 MCG PO TABS: 0.2500 mg | ORAL_TABLET | Freq: Every day | ORAL | Status: DC

## 2021-09-11 MED ORDER — PHENYTOIN SODIUM EXTENDED 100 MG PO CAPS
300.0000 mg | ORAL_CAPSULE | Freq: Two times a day (BID) | ORAL | Status: DC
  Administered 2021-09-11 – 2021-09-12 (×2): 300 mg via ORAL
  Filled 2021-09-11 (×2): qty 3

## 2021-09-11 MED ORDER — PALIPERIDONE ER 6 MG PO TB24
6.0000 mg | ORAL_TABLET | Freq: Every day | ORAL | Status: DC
  Administered 2021-09-11: 6 mg via ORAL
  Filled 2021-09-11 (×2): qty 1

## 2021-09-11 MED ORDER — FUROSEMIDE 20 MG PO TABS
20.0000 mg | ORAL_TABLET | Freq: Every day | ORAL | Status: DC
  Administered 2021-09-11 – 2021-09-12 (×2): 20 mg via ORAL
  Filled 2021-09-11 (×2): qty 1

## 2021-09-11 MED ORDER — DIAZEPAM 2 MG PO TABS
2.0000 mg | ORAL_TABLET | Freq: Two times a day (BID) | ORAL | Status: DC
  Administered 2021-09-11 – 2021-09-12 (×2): 2 mg via ORAL
  Filled 2021-09-11 (×2): qty 1

## 2021-09-11 MED ORDER — DIVALPROEX SODIUM ER 500 MG PO TB24
500.0000 mg | ORAL_TABLET | Freq: Two times a day (BID) | ORAL | Status: DC
  Administered 2021-09-11 – 2021-09-12 (×2): 500 mg via ORAL
  Filled 2021-09-11 (×3): qty 1

## 2021-09-11 NOTE — BH Assessment (Signed)
Jim Dunn, Routine, MR #[id number]; 56 years old presents this date with his sister, Lavella Hammock, [phone]. Pt denied SI, HI, or AVH.  Pt admits to prior MH diagnosis or prescribed medication for symptom management.[name]  MSE signed by patient.

## 2021-09-11 NOTE — ED Notes (Signed)
Security has wanded the pt

## 2021-09-11 NOTE — Discharge Instructions (Addendum)
Take all medications as prescribed. Keep all follow-up appointments as scheduled.  Do not consume alcohol or use illegal drugs while on prescription medications. Report any adverse effects from your medications to your primary care provider promptly.  In the event of recurrent symptoms or worsening symptoms, call 911, a crisis hotline, or go to the nearest emergency department for evaluation.   

## 2021-09-11 NOTE — ED Notes (Signed)
Pt belongings placed in locker 2 and 3.

## 2021-09-11 NOTE — ED Notes (Signed)
[employer] me  he was ready to tts this pt    no room close by  for the pt to be ttsed  will work on it  the  pt rrsides currently in the hallway his sister is at his side .. the pt listens to her
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
## 2021-09-12 ENCOUNTER — Other Ambulatory Visit: Payer: Self-pay

## 2021-09-12 ENCOUNTER — Encounter (HOSPITAL_COMMUNITY): Payer: Self-pay | Admitting: Registered Nurse

## 2021-09-12 DIAGNOSIS — F201 Disorganized schizophrenia: Secondary | ICD-10-CM

## 2021-09-12 DIAGNOSIS — Z789 Other specified health status: Secondary | ICD-10-CM

## 2021-09-12 LAB — TROPONIN I (HIGH SENSITIVITY): Troponin I (High Sensitivity): 8 ng/L (ref <18)

## 2021-09-12 LAB — BASIC METABOLIC PANEL
Anion gap: 9 (ref 5–15)
BUN: 7 mg/dL (ref 6–20)
CO2: 24 mmol/L (ref 22–32)
Calcium: 8.6 mg/dL — ABNORMAL LOW (ref 8.9–10.3)
Chloride: 91 mmol/L — ABNORMAL LOW (ref 98–111)
Creatinine, Ser: 0.64 mg/dL (ref 0.61–1.24)
GFR, Estimated: >60 mL/min (ref >60)
Glucose, Bld: 96 mg/dL (ref 70–99)
Potassium: 3.7 mmol/L (ref 3.5–5.1)
Sodium: 124 mmol/L — ABNORMAL LOW (ref 135–145)

## 2021-09-12 NOTE — ED Notes (Signed)
Pt verbalized understanding of d/c instructions, meds, and followup care. Denies questions. VSS, no distress noted. Steady gait to exit with all belongings. Report given to family (sister). Assisted to lobby to meet sister.
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
## 2021-09-12 NOTE — Consult Note (Signed)
[place] [facility] Consult   Reason for Consult:  suicidal ideation Referring Physician:  Estill Cotta Patient Identification: Jim Dunn MRN:  [id number] Principal Diagnosis: Needs assistance with community resources Diagnosis:  Principal Problem:   Needs assistance with community resources Active Problems:   Disorganized schizophrenia ([place])   Total Time spent with patient: 30 minutes  Subjective:   Jim Dunn is a 56 y.o. male patient male with psychiatric history of schizophrenia.  Patient has had multiple ED visits but doesn't follow up with referrals for psychiatric resources.   Patient was seen at [facility]) 09/11/21 along with his sister he was psychiatrically cleared, and provider spoke with patient and his sister about outpatient psychiatric services and the importance of follow-up for medication management and therapy services.  Both voiced understanding.  Patient left [facility] and went directly to [facility] ED where patient was seen via tele health by TTS counselor and was psychiatrically cleared again.  Patient then involuntarily committed  HPI:  Jim Dunn, 56 y.o., male patient seen face to face by this provider, consulted with Dr. Hampton Abbot; and chart reviewed on 09/12/21.  On evaluation Jim Dunn reports he is having racing thoughts about things that happened to him in his past.  At this time, he is denying suicidal/self-harm/homicidal ideation, auditory/visual hallucinations, and paranoia.  States that the thoughts he is having I just keep thing of the bad things they said about me.  He doesn't elaborate on what bad things were said.  Patient states that he has not threaten to burn down house but states he does get irritable at time.  Patient states he hasn't followed up with any resources that have been given and states that he has no one to prescribe his medications.  Patient does live with his elderly  mother.  He voices that he understands he need to establish outpatient psychiatric services and states he will do it this time.  Again, explained to patient if he is having trouble with transportation to and from doctor he can use Medicaid transport.  Also, the need to establish Medicaid care coordinator and can find the number on the back of his Medicaid card During evaluation Jim Dunn is in bed sitting up with no acute distress.  He is alert, oriented x 4, calm and cooperative.  His mood is anxious and depressed with congruent affect.  He does not appear to be responding to internal/external stimuli or delusional thoughts.  Patient denies suicidal/self-harm/homicidal ideation, psychosis, and paranoia.  Patient answered question appropriately.  Collateral Information:  Attempted to contact patient's sister Jim Dunn but no answer.  HIPPA compliant message left.   After chart review and speaking to provider that spoke to patient and his sister yesterday at [facility]:  Sisters concerns Concerns with pacing, restlessness and irritability.  Reports patient had multiple inpatient admissions she states she is unsure which medications patient supposed to be prescribed.  Reports patient becomes restless and starts burning things throughout the home.  Reports patient currently resides with elderly mother who is unable to care for him.  Sister states she believes that patient is medication compliant.  States " I do not think his medications are strong enough." Respite care and or group home services.  Spoke to patients sister:  Sister states: Patient unable to care for himself, mother and sister no longer able to care for him.  Stating he needs facility placement (group home, assisted living or  family care home) but they can't care for him.  Sister states they are also states patient needs a guardian and wanting DSS to take guardianship of patient.  Can you call sister Jim Dunn at [phone] an inform how  she can start services, next step to take, or what she needs to do.  Patient unable to care for himself on street and he may end up being another placement problem if they are unwilling to take him back home.    Wants to speak to social work:  Consult ordered:  Can call family to speak with his sister wanting to know process of how DSS can establish guardianship of patient.  Patient has a history of dementia, and his mother nor sister are able to care for him.  States they have tried multiple times to get services but have been unable to.  Reports patient has to be reminded to take a bath.  He will leave and is usually brought back by police.  States he is disrespectful to mother.  Sister states that patient smokes in the home and will put the cigarette out anywhere.  He has burned counter tops in places where he has laid the cigarette down.  Sister reporting multiple incidents of patient not following the rules of his mother's home.  States that patient has been in multiple group homes and have been put out because he doesn't follow the rules.  States that her mother is to old to care for him and she is not in a place where she can.  I work 2 jobs and I just can't keep him full time and that's what he needs.  Informed I would contact social work and have them to give her a call and may be able to assist with next step or other services available.    Past Psychiatric History: Schizophrenia  Risk to Self:  Denies Risk to Others:  Denies Prior Inpatient Therapy:  Yes Prior Outpatient Therapy:  Yes  Past Medical History:  Past Medical History:  Diagnosis Date   CHF (congestive heart failure) ([place])    COPD (chronic obstructive pulmonary disease) ([place])    History of kidney stones    Hyponatremia    Paranoid schizophrenia ([place])    Seizures ([place])    Tobacco abuse     Past Surgical History:  Procedure Laterality Date   KIDNEY STONE SURGERY     MULTIPLE EXTRACTIONS WITH ALVEOLOPLASTY Bilateral  04/29/2020   Procedure: Extraction of tooth #'s 2-13, 17,18, 21-25, and 27-31 with alveoloplasty and bilateral lingual exostoses reductions.;  Surgeon: Lenn Cal, DDS;  Location: [facility];  Service: Oral Surgery;  Laterality: Bilateral;   Family History:  Family History  Problem Relation Age of Onset   Heart disease Other    Family Psychiatric  History: None reported Social History:  Social History   Substance and Sexual Activity  Alcohol Use Not Currently     Social History   Substance and Sexual Activity  Drug Use Never    Social History   Socioeconomic History   Marital status: Single    Spouse name: Not on file   Number of children: Not on file   Years of education: Not on file   Highest education level: Not on file  Occupational History   Occupation: Disability  Tobacco Use   Smoking status: Every Day    Packs/day: 1.00    Types: Cigarettes   Smokeless tobacco: Current    Types: Loss adjuster, chartered  Tobacco comments:    2-3 cigarettes smoked daily 07/22/20 [facility]   Vaping Use   Vaping Use: Never used  Substance and Sexual Activity   Alcohol use: Not Currently   Drug use: Never   Sexual activity: Not Currently  Other Topics Concern   Not on file  Social History Narrative   Pt lives [facility].  He receives outpatient psychiatric services through the ALF.  He stated that prior to moving there, he did not have a psychiatrist.     Social Determinants of Health   Financial Resource Strain: Not on file  Food Insecurity: Not on file  Transportation Needs: Not on file  Physical Activity: Not on file  Stress: Not on file  Social Connections: Not on file   Additional Social History:    Allergies:  No Known Allergies  Labs:  Results for orders placed or performed during the hospital encounter of 09/11/21 (from the past 48 hour(s))  Comprehensive metabolic panel     Status: Abnormal   Collection Time: 09/11/21  3:38 PM  Result Value Ref Range   Sodium 122 (L)  135 - 145 mmol/L   Potassium 4.3 3.5 - 5.1 mmol/L   Chloride 86 (L) 98 - 111 mmol/L   CO2 23 22 - 32 mmol/L   Glucose, Bld 103 (H) 70 - 99 mg/dL    Comment: Glucose reference range applies only to samples taken after fasting for at least 8 hours.   BUN 6 6 - 20 mg/dL   Creatinine, Ser 0.52 (L) 0.61 - 1.24 mg/dL   Calcium 9.3 8.9 - 10.3 mg/dL   Total Protein 7.3 6.5 - 8.1 g/dL   Albumin 4.0 3.5 - 5.0 g/dL   AST 31 15 - 41 U/L   ALT 12 0 - 44 U/L   Alkaline Phosphatase 98 38 - 126 U/L   Total Bilirubin 1.5 (H) 0.3 - 1.2 mg/dL   GFR, Estimated >60 >60 mL/min    Comment: (NOTE) Calculated using the CKD-EPI Creatinine Equation (2021)    Anion gap 13 5 - 15    Comment: Performed at [facility] [address], [place], [place] [address]  Ethanol     Status: None   Collection Time: 09/11/21  3:38 PM  Result Value Ref Range   Alcohol, Ethyl (B) <10 <10 mg/dL    Comment: (NOTE) Lowest detectable limit for serum alcohol is 10 mg/dL.  For medical purposes only. Performed at [facility], [place] [address] [place], [place] [address]   CBC with Diff     Status: Abnormal   Collection Time: 09/11/21  3:38 PM  Result Value Ref Range   WBC 8.9 4.0 - 10.5 K/uL   RBC 6.05 (H) 4.22 - 5.81 MIL/uL   Hemoglobin 14.0 13.0 - 17.0 g/dL   HCT 41.2 39.0 - 52.0 %   MCV 68.1 (L) 80.0 - 100.0 fL   MCH 23.1 (L) 26.0 - 34.0 pg   MCHC 34.0 30.0 - 36.0 g/dL   RDW 15.2 11.5 - 15.5 %   Platelets 202 150 - 400 K/uL   nRBC 0.0 0.0 - 0.2 %   Neutrophils Relative % 56 %   Neutro Abs 4.9 1.7 - 7.7 K/uL   Lymphocytes Relative 31 %   Lymphs Abs 2.7 0.7 - 4.0 K/uL   Monocytes Relative 12 %   Monocytes Absolute 1.1 (H) 0.1 - 1.0 K/uL   Eosinophils Relative 1 %   Eosinophils Absolute 0.1  0.0 - 0.5 K/uL   Basophils Relative 0 %   Basophils Absolute 0.0 0.0 - 0.1 K/uL   Immature Granulocytes 0 %   Abs Immature Granulocytes 0.03 0.00 - 0.07 K/uL    Comment: Performed at [facility],  [place] [address] [place], [place] [address]  Resp Panel by RT-PCR (Flu A&B, Covid) Nasopharyngeal Swab     Status: None   Collection Time: 09/11/21  6:20 PM   Specimen: Nasopharyngeal Swab; Nasopharyngeal(NP) swabs in vial transport medium  Result Value Ref Range   SARS Coronavirus 2 by RT PCR NEGATIVE NEGATIVE    Comment: (NOTE) SARS-CoV-2 target nucleic acids are NOT DETECTED.  The SARS-CoV-2 RNA is generally detectable in upper respiratory specimens during the acute phase of infection. The lowest concentration of SARS-CoV-2 viral copies this assay can detect is 138 copies/mL. A negative result does not preclude SARS-Cov-2 infection and should not be used as the sole basis for treatment or other patient management decisions. A negative result may occur with  improper specimen collection/handling, submission of specimen other than nasopharyngeal swab, presence of viral mutation(s) within the areas targeted by this assay, and inadequate number of viral copies(<138 copies/mL). A negative result must be combined with clinical observations, patient history, and epidemiological information. The expected result is Negative.  Fact Sheet for Patients:  EntrepreneurPulse.com.au  Fact Sheet for Healthcare Providers:  IncredibleEmployment.be  This test is no t yet approved or cleared by the [place] FDA and  has been authorized for detection and/or diagnosis of SARS-CoV-2 by FDA under an Emergency Use Authorization (EUA). This EUA will remain  in effect (meaning this test can be used) for the duration of the COVID-19 declaration under Section 564(b)(1) of the Act, 21 U.S.C.section 360bbb-3(b)(1), unless the authorization is terminated  or revoked sooner.       Influenza A by PCR NEGATIVE NEGATIVE   Influenza B by PCR NEGATIVE NEGATIVE    Comment: (NOTE) The Xpert Xpress SARS-CoV-2/FLU/RSV plus assay is intended as an aid in the diagnosis of influenza  from Nasopharyngeal swab specimens and should not be used as a sole basis for treatment. Nasal washings and aspirates are unacceptable for Xpert Xpress SARS-CoV-2/FLU/RSV testing.  Fact Sheet for Patients: EntrepreneurPulse.com.au  Fact Sheet for Healthcare Providers: IncredibleEmployment.be  This test is not yet approved or cleared by the [place] FDA and has been authorized for detection and/or diagnosis of SARS-CoV-2 by FDA under an Emergency Use Authorization (EUA). This EUA will remain in effect (meaning this test can be used) for the duration of the COVID-19 declaration under Section 564(b)(1) of the Act, 21 U.S.C. section 360bbb-3(b)(1), unless the authorization is terminated or revoked.  Performed at [facility], [place] [address], [place],  [id number]   Basic metabolic panel     Status: Abnormal   Collection Time: 09/12/21  1:13 PM  Result Value Ref Range   Sodium 124 (L) 135 - 145 mmol/L   Potassium 3.7 3.5 - 5.1 mmol/L   Chloride 91 (L) 98 - 111 mmol/L   CO2 24 22 - 32 mmol/L   Glucose, Bld 96 70 - 99 mg/dL    Comment: Glucose reference range applies only to samples taken after fasting for at least 8 hours.   BUN 7 6 - 20 mg/dL   Creatinine, Ser 0.64 0.61 - 1.24 mg/dL   Calcium 8.6 (L) 8.9 - 10.3 mg/dL   GFR, Estimated >60 >60 mL/min    Comment: (NOTE) Calculated using  the CKD-EPI Creatinine Equation (2021)    Anion gap 9 5 - 15    Comment: Performed at [facility], [place] [address] [place], [place] [address]    Current Facility-Administered Medications  Medication Dose Route Frequency Provider Last Rate Last Admin   busPIRone (BUSPAR) tablet 10 mg  10 mg Oral TID Carmin Muskrat, MD   10 mg at 09/12/21 [id number]   chlorproMAZINE (THORAZINE) tablet 100 mg  100 mg Oral TID Carmin Muskrat, MD   100 mg at 09/12/21 [id number]   diazepam (VALIUM) tablet 2 mg  2 mg Oral BID Carmin Muskrat, MD   2 mg at 09/12/21 [id number]    digoxin (LANOXIN) tablet 0.25 mg  0.25 mg Oral Daily Carmin Muskrat, MD   0.25 mg at 09/12/21 0951   divalproex (DEPAKOTE ER) 24 hr tablet 500 mg  500 mg Oral BID Carmin Muskrat, MD   500 mg at 09/12/21 [id number]   furosemide (LASIX) tablet 20 mg  20 mg Oral Daily Carmin Muskrat, MD   20 mg at 09/12/21 0951   paliperidone (INVEGA) 24 hr tablet 6 mg  6 mg Oral QHS Carmin Muskrat, MD   6 mg at 09/11/21 2247   phenytoin (DILANTIN) ER capsule 300 mg  300 mg Oral BID Carmin Muskrat, MD   300 mg at 09/12/21 0950   QUEtiapine (SEROQUEL) tablet 200 mg  200 mg Oral Daily Carmin Muskrat, MD   200 mg at 09/12/21 [id number]   sacubitril-valsartan (ENTRESTO) 24-26 mg per tablet  1 tablet Oral BID Carmin Muskrat, MD   1 tablet at 09/12/21 [id number]   Current Outpatient Medications  Medication Sig Dispense Refill   atorvastatin (LIPITOR) 40 MG tablet Take 1 tablet (40 mg total) by mouth daily. (Patient taking differently: Take 40 mg by mouth every morning.) 30 tablet 2   busPIRone (BUSPAR) 10 MG tablet Take 1 tablet (10 mg total) by mouth 3 (three) times daily. 90 tablet 2   digoxin (LANOXIN) 0.25 MG tablet Take 1 tablet (0.25 mg total) by mouth daily. (Patient taking differently: Take 0.25 mg by mouth every morning.) 30 tablet 2   divalproex (DEPAKOTE ER) 500 MG 24 hr tablet Take 2 tablets (1,000 mg total) by mouth at bedtime. (Patient taking differently: Take 500 mg by mouth 2 (two) times daily.) 60 tablet 2   nicotine (NICODERM CQ) 14 mg/24hr patch Place 1 patch (14 mg total) onto the skin daily. 28 patch 1   sacubitril-valsartan (ENTRESTO) 24-26 MG Take 1 tablet by mouth 2 (two) times daily. 60 tablet 2   acetaminophen (TYLENOL) 325 MG tablet Take 2 tablets (650 mg total) by mouth every 4 (four) hours as needed for mild pain or fever. (Patient not taking: Reported on 09/11/2021)     chlorproMAZINE (THORAZINE) 100 MG tablet Take 1 tablet (100 mg total) by mouth 3 (three) times daily. (Patient not taking:  Reported on 09/11/2021) 90 tablet 1   diazepam (VALIUM) 2 MG tablet Take 1 tablet (2 mg total) by mouth 2 (two) times daily. (Patient not taking: Reported on 09/11/2021) 60 tablet 0   furosemide (LASIX) 20 MG tablet Take 1 tablet (20 mg total) by mouth daily. (Patient not taking: Reported on 09/11/2021) 30 tablet 2   INVEGA SUSTENNA 234 MG/1.5ML SUSY injection Inject 234 mg into the muscle every 30 (thirty) days. (Patient not taking: Reported on 09/11/2021) 1.8 mL 2   paliperidone (INVEGA) 6 MG 24 hr tablet Take 1 tablet (6 mg total) by mouth at  bedtime. (Patient not taking: Reported on 09/11/2021) 30 tablet 2   phenytoin (DILANTIN) 300 MG ER capsule Take 1 capsule (300 mg total) by mouth 2 (two) times daily. (Patient not taking: Reported on 09/11/2021) 60 capsule 2   QUEtiapine (SEROQUEL) 200 MG tablet Take 1 tablet (200 mg total) by mouth daily. (Patient not taking: Reported on 09/11/2021) 30 tablet 2    Musculoskeletal: Strength & Muscle Tone: within normal limits Gait & Station: normal Patient leans: N/A            Psychiatric Specialty Exam:  Presentation  General Appearance: Appropriate for Environment  Eye Contact:Good  Speech:Clear and Coherent  Speech Volume:Normal  Handedness:Right   Mood and Affect  Mood:Anxious; Depressed  Affect:Appropriate; Congruent   Thought Process  Thought Processes:Coherent; Goal Directed  Descriptions of Associations:Circumstantial  Orientation:Full (Time, Place and Person)  Thought Content:Logical  History of Schizophrenia/Schizoaffective disorder:Yes  Duration of Psychotic Symptoms:Greater than six months  Hallucinations:Hallucinations: None  Ideas of Reference:None  Suicidal Thoughts:Suicidal Thoughts: No  Homicidal Thoughts:Homicidal Thoughts: No   Sensorium  Memory:Immediate Good; Recent Good; Remote Fair  Judgment:Intact  Insight:Present   Executive Functions  Concentration:Good  Attention  Span:Good  [place]  Language:Good   Psychomotor Activity  Psychomotor Activity:Psychomotor Activity: Normal  Assets  Assets:Communication Skills; Desire for Improvement; Financial Resources/Insurance; Housing; Social Support   Sleep  Sleep:Sleep: Good  Physical Exam: Physical Exam Vitals and nursing note reviewed. Exam conducted with a chaperone present.  Constitutional:      General: He is not in acute distress.    Appearance: Normal appearance. He is not ill-appearing.  Cardiovascular:     Rate and Rhythm: Normal rate.  Pulmonary:     Effort: Pulmonary effort is normal.  Neurological:     Mental Status: He is alert and oriented to person, place, and time.  Psychiatric:        Attention and Perception: Attention and perception normal. He does not perceive auditory or visual hallucinations.        Mood and Affect: Mood is anxious and depressed.        Speech: Speech normal.        Behavior: Behavior normal. Behavior is cooperative.        Thought Content: Thought content normal. Thought content is not paranoid or delusional. Thought content does not include homicidal or suicidal ideation.        Judgment: Judgment is impulsive.   Review of Systems  Constitutional: Negative.   HENT: Negative.    Eyes: Negative.   Respiratory: Negative.    Cardiovascular:  Positive for palpitations.  Gastrointestinal: Negative.   Genitourinary: Negative.   Musculoskeletal: Negative.   Skin: Negative.   Neurological: Negative.   Endo/Heme/Allergies: Negative.   Psychiatric/Behavioral:  Depression: Stable. Hallucinations: Denies. Substance abuse: Denies. Suicidal ideas: Denies. The patient does not have insomnia. Nervous/anxious: Stable.       Patient stating he wants to get help for his thoughts.  States he is having thoughts about things that happened in his past and he keeps thing about what people have said about him in the past.   Reports prior history  of drug use.    Blood pressure (!) 158/104, pulse 86, temperature 97.7 F (36.5 C), temperature source Oral, resp. rate 18, SpO2 96 %. There is no height or weight on file to calculate BMI.  Treatment Plan Summary: Plan Psychiatrically clear to follow up with resources given for outpatient psychiatric services  Disposition: No  evidence of imminent risk to self or others at present.   Patient does not meet criteria for psychiatric inpatient admission. Supportive therapy provided about ongoing stressors. Refer to IOP. Discussed crisis plan, support from social network, calling 911, coming to the Emergency Department, and calling Suicide Hotline.   Social work consult ordered:  Spoke with Dr. Roslynn Amble via tele phone several times related to assessment, recommendations, and conversations with family.  Patient psychiatric cleared to follow up with outpatient psychiatric services.  Resources given, social work consult order to assist family with other resources.     Darrick Greenlaw, NP 09/12/2021 2:44 PM

## 2021-09-12 NOTE — ED Notes (Signed)
Patient sister called and wanted update , Dr. Stevie Kern speaking with her,

## 2021-09-12 NOTE — ED Provider Notes (Signed)
Emergency Medicine Observation Re-evaluation Note  Jim Dunn is a 56 y.o. male, seen on rounds today.  Pt initially presented to the ED for complaints of Schizophrenia Currently, the patient is resting in the hallway.  Physical Exam  BP 129/83 (BP Location: Right Arm)    Pulse (!) 122    Temp 98.5 F (36.9 C) (Oral)    Resp 18    SpO2 97%  Physical Exam General: resting Cardiac: warm and well perfused Lungs: even and unlabored Psych: calm  ED Course / MDM  EKG:   I have reviewed the labs performed to date as well as medications administered while in observation.  Recent changes in the last 24 hours include evaluation by psychiatry yesterday evening by Venda Rodes -he recommended psychiatric clearance.  IVC completed by Leotis Shames PA/[place] MD.  I have discussed the case with [facility], who evaluated patient today and she still believes he does not require inpatient psychiatric admission and recommends discharge.  I discussed the case with [name], patient's sister, she would like to discuss with psychiatry.  Reviewed laboratory work, noted hyponatremia.  This appears to be a chronic problem for patient.  Repeated BMP, 124 today.  Given the chronicity and no specific symptoms, feel that this can continue to be managed in the outpatient setting.  Would strongly recommend close follow-up with PCP for further monitoring.  Plan  Current plan is for further discussion with family and Shuvon regarding disposition.  Discussed case with Dr. Denton Lank who will follow-up on Shuvon's recommendations after further discussion with family.     Milagros Loll, MD 09/12/21 [phone]

## 2021-09-12 NOTE — ED Provider Notes (Signed)
Patient has been psych cleared for d/c by [facility] team.  On recheck, pt currently denies any physical c/o. No headache. No cp or sob. No abd pain or nv.   Pt reports normal appetite, and has ate/drank well in ED.    Currently, hr 84, rr 16, and pulse ox 98%.  Pt appears resting comfortably. Pt has normal mood and affect. Pt does not appear acutely depressed or despondent. Pt denies any thoughts of harm to self or others. Pt does not appear to be responding to internal stimuli -  no delusions or hallucinations are noted.   Pt currently appears stable for d/c per [facility] plan.  Pt is encouraged to f/u with pcp and behavioral health provided closely as outpatient.   Return precautions provided.        Cathren Laine, MD 09/12/21 [phone]

## 2021-09-12 NOTE — Discharge Instructions (Addendum)
It was our pleasure to provide your ER care today - we hope that you feel better.  For your low sodium, recommend following up closely with your primary care doctor in the coming week and have your sodium rechecked - call office today or tomorrow AM to arrange appointment. Also have your blood pressure rechecked then, as it is high today.  For behavioral health issues, follow up closely with your behavioral health specialist. For mental health issues and/or crisis, you may also go directly to the Behavioral Health Urgent Care - walk-ins are welcome.   Should you wish to pursue extended care facility stay, please discuss that with family and primary care doctor.   Return to ER if worse, new symptoms, fevers, chest pain, increased trouble breathing, or other emergency concern.

## 2021-09-12 NOTE — ED Notes (Signed)
Update given to family. They are on the way to pick on patient. Patient belongings returned and clothes changed. Patient cooperative. Aox4. Suggested to stay in hall bed until family arrives.

## 2021-09-18 ENCOUNTER — Ambulatory Visit (HOSPITAL_COMMUNITY)
Admission: EM | Admit: 2021-09-18 | Discharge: 2021-09-18 | Disposition: A | Discharge status: Home / Self Care | Payer: Medicare PPO | Attending: Psychiatry | Admitting: Psychiatry

## 2021-09-18 DIAGNOSIS — Z5900 Homelessness unspecified: Secondary | ICD-10-CM | POA: Diagnosis not present

## 2021-09-18 DIAGNOSIS — F200 Paranoid schizophrenia: Secondary | ICD-10-CM

## 2021-09-18 DIAGNOSIS — F0393 Unspecified dementia, unspecified severity, with mood disturbance: Secondary | ICD-10-CM | POA: Diagnosis not present

## 2021-09-18 DIAGNOSIS — Z638 Other specified problems related to primary support group: Secondary | ICD-10-CM | POA: Diagnosis not present

## 2021-09-18 NOTE — ED Provider Notes (Signed)
Behavioral Health Urgent Care Medical Screening Exam  Patient Name: Jim Dunn MRN: [id number] Date of Evaluation: 09/18/21 Chief Complaint:   Diagnosis:  Final diagnoses:  Family discord    History of Present illness: Jim Dunn is a 56 y.o. male patient presented to [facility] as a walk in  voluntarily accompanied by [employer] with complaints of "my sister and mom wont let me come home I need a shelter or some where to live".  Jim Dunn, 56 y.o., male patient seen face to face by this provider, consulted with Dr. Lucianne Muss; and chart reviewed on 09/18/21.  Per chart review patient has a history of schizophrenia. Per sister patient has been diagnosed with Dementia.  Per Assunta Found NP note on 09/12/2021 "Patient has had multiple ED visits but doesn't follow up with referrals for psychiatric resources. Patient was seen at [facility]) 09/11/21 along with his sister he was psychiatrically cleared, and provider spoke with patient and his sister about outpatient psychiatric services and the importance of follow-up for medication management and therapy services.  Both voiced understanding.  Patient left [facility] and went directly to [facility] ED where patient was seen via tele health by TTS counselor and was psychiatrically cleared again.  Patient then was involuntarily committed".  Patient was again psychiatrically cleared.  Upon discharge TOC consult was completed.  Resources were given for instructions on how to apply for Medicaid and information regarding DSS guardianship.    Devonte Endrizzi states his sister called the police today after she told him he could no longer live in the home. The police brought him to [facility] for assessment.  He is requesting help finding a place to live  During evaluation Jim Dunn is in sitting position in no acute distress.  He makes good eye contact.  His speech is moderate volume, pace, and clear tone.  He is calm and cooperative.  He is  alert and oriented x4.  Denies any concerns with appetite.  Reports he only sleeps 5-6 hours per night.  He endorses depression due to his circumstances.  He appears slightly anxious.  There is no indication that he is currently responding to internal/external stimuli or experiencing delusional thought content.  He denies paranoia or delusional thought.  He denies AVH/SI/HI.  He contracts for safety.  Denies access to firearms/weapons. He is logical and can answer questions appropriately.   Patient does not meet criteria for inpatient psychiatric services.  Collateral: Jim Dunn (sister).  With patient's permission.  [name] states there is too much conflict in the home between her son and [place].  States her son almost lost her job today due to the stress that is in the home due to [place]. She works 2 jobs and it is too much for her. States Jim Dunn can no longer live in their home.  She continued to complain that Jim Dunn is turning on the stove and leaving it on, he is urinating on the carpet and in the garage floor, and putting cigarettes out on the wall.  He refuses to abide by the rules in the home.  States upon his discharge from [facility] they did not get any information or resources that could be helpful.  She did follow-up with DSS but states she was told that she would need to go to a different county.  Discussed with sister that patient did not meet inpatient psychiatric admission criteria and that he would be discharged.  She was adamant that  patient cannot return to her home and if he had to be discharged to the street or shelter then "that is what needs to happen because he cannot come here".  Resources were provided to sister for [facility], they have bed availability.  She was told that patient could present today for an assessment.  States she cannot take him today it would probably be a couple days before she would have time to take them.  States he does not have a primary care physician and  that is why an FL 2 has not been performed.  This resource was given to sister and to [place] with the following instructions.  [facility] Assisted living facility [address], [place], [place] [address]  Phone number:  [phone] Give this information to the family and let them know that they can take him there and the lady will tell what all they need for admission.  She can assess him once he is there.   He will need someone to take him.  Before he can be admitted he will need to have a FL2 and a TB skin test done.  This can be done by his PCP.  The family can take him to her home for an intake assessment and she will tell all that she needs.  If he has family that can take him he can go today.   He can't be dropped off there; but at least the family can follow up and have somewhere for him to go once they have all the forms and test done   At this time Jim Dunn is educated and verbalizes understanding of mental health resources and other crisis services in the community.  He is instructed to call 911 and present to the nearest emergency room should he experience any suicidal/homicidal ideation, auditory/visual/hallucinations, or detrimental worsening of he mental health condition.  He was a also advised by Clinical research associate that he could call the toll-free phone on insurance card to assist with identifying in network counselors and agencies or number on back of Medicaid card t speak with care coordinator    Psychiatric Specialty Exam  Presentation  General Appearance:Appropriate for Environment; Casual  Eye Contact:Good  Speech:Clear and Coherent; Normal Rate  Speech Volume:Normal  Handedness:Right   Mood and Affect  Mood:Anxious; Depressed  Affect:Congruent   Thought Process  Thought Processes:Coherent; Goal Directed  Descriptions of Associations:Circumstantial  Orientation:Full (Time, Place and Person)  Thought Content:Logical  Diagnosis of Schizophrenia or  Schizoaffective disorder in past: Yes  Duration of Psychotic Symptoms: Greater than six months  Hallucinations:None  Ideas of Reference:None  Suicidal Thoughts:No  Homicidal Thoughts:No   Sensorium  Memory:Immediate Good; Recent Good; Remote Good  Judgment:Intact  Insight:Present   Executive Functions  Concentration:Good  Attention Span:Good  Recall:Good  Fund of Knowledge:Good  Language:Good   Psychomotor Activity  Psychomotor Activity:Normal   Assets  Assets:Communication Skills; Desire for Improvement; Financial Resources/Insurance; Resilience; Physical Health   Sleep  Sleep:Poor  Number of hours: 5   No data recorded  Physical Exam: Physical Exam Vitals and nursing note reviewed.  Constitutional:      General: He is not in acute distress.    Appearance: Normal appearance. He is well-developed.  HENT:     Head: Normocephalic and atraumatic.  Eyes:     General:        Right eye: No discharge.        Left eye: No discharge.     Conjunctiva/sclera: Conjunctivae  normal.  Cardiovascular:     Rate and Rhythm: Normal rate.     Heart sounds: No murmur heard. Pulmonary:     Effort: Pulmonary effort is normal. No respiratory distress.  Musculoskeletal:        General: Normal range of motion.     Cervical back: Neck supple.  Skin:    Coloration: Skin is not jaundiced or pale.  Neurological:     Mental Status: He is alert and oriented to person, place, and time.  Psychiatric:        Attention and Perception: Attention and perception normal.        Mood and Affect: Mood is anxious and depressed.        Speech: Speech normal.        Behavior: Behavior normal. Behavior is cooperative.        Thought Content: Thought content normal.        Cognition and Memory: Cognition normal.        Judgment: Judgment normal.   Review of Systems  Constitutional: Negative.   HENT: Negative.    Eyes: Negative.   Respiratory: Negative.    Cardiovascular:  Negative.   Musculoskeletal: Negative.   Skin: Negative.   Neurological: Negative.   Psychiatric/Behavioral:  Positive for depression. The patient is nervous/anxious.   Blood pressure (!) 167/107, pulse 90, temperature 98.1 F (36.7 C), temperature source Oral, resp. rate 20, SpO2 100 %. There is no height or weight on file to calculate BMI.  Musculoskeletal: Strength & Muscle Tone: within normal limits Gait & Station: normal Patient leans: N/A   [facility] MSE Discharge Disposition for Follow up and Recommendations: Based on my evaluation the patient does not appear to have an emergency medical condition and can be discharged with resources and follow up care in outpatient services for Medication Management  Discharge patient.  [employer] will take patient to a shelter on [address].  Bed availability is uncertain.  Per sister patient has an appointment with [facility] for medication management this Tuesday 09/20/2021.  Provided resources for [facility].  Resources were provided for: [facility] facility [address] [place], [place] [address]  Phone number:  [phone] Give this information to the family and let them know that they can take him there and the lady will tell what all they need for admission.  She can assess him once he is there.   He will need someone to take him.  Before he can be admitted he will need to have a FL2 and a TB skin test done.  This can be done by his PCP.  The family can take him to her home for an intake assessment and she will tell all that she needs.  If he has family that can take him he can go today.   He can't be dropped off there; but at least the family can follow up and have somewhere for him to go once they have all the forms and test done   Ardis Hughs, NP 09/18/2021, 1:48 PM

## 2021-09-18 NOTE — Discharge Instructions (Addendum)
[facility] Assisted living facility [address], [place], [place] [address]  Phone number:  [phone] Give this information to the family and let them know that they can take him there and the lady will tell what all they need for admission.  She can assess him once he is there.   He will need someone to take him.  Before he can be admitted he will need to have a FL2 and a TB skin test done.  This can be done by his PCP.  The family can take him to her home for an intake assessment and she will tell all that she needs.  If he has family that can take him he can go today.   He can't be dropped off there; but at least the family can follow up and have somewhere for him to go once they have all the forms and test done  Patient is instructed prior to discharge to: Take all medications as prescribed by his/her mental healthcare provider. Report any adverse effects and or reactions from the medicines to his/her outpatient provider promptly. Patient has been instructed & cautioned: To not engage in alcohol and or illegal drug use while on prescription medicines. In the event of worsening symptoms, patient is instructed to call the crisis hotline, 911 and or go to the nearest ED for appropriate evaluation and treatment of symptoms. To follow-up with his/her primary care provider for your other medical issues, concerns and or health care needs.

## 2021-09-23 ENCOUNTER — Telehealth (HOSPITAL_COMMUNITY): Payer: Self-pay

## 2021-09-23 NOTE — BH Assessment (Signed)
Care Management - [facility] Follow Up Discharges   Writer attempted to make contact with patient today and was unsuccessful.  Writer left a HIPPA compliant voice message.   Per chart review, patient was provided with outpatient resources. Per chart review, the patient does not have a phone and the number listed is his mother's phone number.

## 2021-09-23 NOTE — Telephone Encounter (Signed)
[name], sister, [phone] returned call to report pt at [facility] and they are requesting she pick him up. Requesting return call when you return to the office next week.

## 2021-09-24 ENCOUNTER — Inpatient Hospital Stay (HOSPITAL_COMMUNITY)
Admission: EM | Admit: 2021-09-24 | Discharge: 2022-04-07 | DRG: 643 | Disposition: A | Discharge status: Home / Self Care | Payer: Medicare PPO | Attending: Family Medicine | Admitting: Family Medicine

## 2021-09-24 ENCOUNTER — Encounter (HOSPITAL_COMMUNITY): Payer: Self-pay

## 2021-09-24 ENCOUNTER — Other Ambulatory Visit: Payer: Self-pay

## 2021-09-24 DIAGNOSIS — Z23 Encounter for immunization: Secondary | ICD-10-CM

## 2021-09-24 DIAGNOSIS — G40909 Epilepsy, unspecified, not intractable, without status epilepticus: Secondary | ICD-10-CM

## 2021-09-24 DIAGNOSIS — Z6841 Body Mass Index (BMI) 40.0 and over, adult: Secondary | ICD-10-CM

## 2021-09-24 DIAGNOSIS — I959 Hypotension, unspecified: Secondary | ICD-10-CM | POA: Diagnosis not present

## 2021-09-24 DIAGNOSIS — F17210 Nicotine dependence, cigarettes, uncomplicated: Secondary | ICD-10-CM | POA: Diagnosis present

## 2021-09-24 DIAGNOSIS — E222 Syndrome of inappropriate secretion of antidiuretic hormone: Secondary | ICD-10-CM | POA: Diagnosis not present

## 2021-09-24 DIAGNOSIS — J690 Pneumonitis due to inhalation of food and vomit: Secondary | ICD-10-CM

## 2021-09-24 DIAGNOSIS — Z638 Other specified problems related to primary support group: Secondary | ICD-10-CM

## 2021-09-24 DIAGNOSIS — R748 Abnormal levels of other serum enzymes: Secondary | ICD-10-CM

## 2021-09-24 DIAGNOSIS — J449 Chronic obstructive pulmonary disease, unspecified: Secondary | ICD-10-CM | POA: Diagnosis present

## 2021-09-24 DIAGNOSIS — E669 Obesity, unspecified: Secondary | ICD-10-CM

## 2021-09-24 DIAGNOSIS — Z91119 Patient's noncompliance with dietary regimen due to unspecified reason: Secondary | ICD-10-CM

## 2021-09-24 DIAGNOSIS — T43505A Adverse effect of unspecified antipsychotics and neuroleptics, initial encounter: Secondary | ICD-10-CM | POA: Diagnosis present

## 2021-09-24 DIAGNOSIS — K5900 Constipation, unspecified: Secondary | ICD-10-CM

## 2021-09-24 DIAGNOSIS — R413 Other amnesia: Secondary | ICD-10-CM | POA: Diagnosis present

## 2021-09-24 DIAGNOSIS — Z91148 Patient's other noncompliance with medication regimen for other reason: Secondary | ICD-10-CM

## 2021-09-24 DIAGNOSIS — N179 Acute kidney failure, unspecified: Secondary | ICD-10-CM | POA: Diagnosis not present

## 2021-09-24 DIAGNOSIS — Z9183 Wandering in diseases classified elsewhere: Secondary | ICD-10-CM

## 2021-09-24 DIAGNOSIS — I110 Hypertensive heart disease with heart failure: Secondary | ICD-10-CM | POA: Diagnosis present

## 2021-09-24 DIAGNOSIS — F631 Pyromania: Secondary | ICD-10-CM | POA: Diagnosis present

## 2021-09-24 DIAGNOSIS — G40409 Other generalized epilepsy and epileptic syndromes, not intractable, without status epilepticus: Secondary | ICD-10-CM | POA: Diagnosis present

## 2021-09-24 DIAGNOSIS — F200 Paranoid schizophrenia: Secondary | ICD-10-CM | POA: Diagnosis present

## 2021-09-24 DIAGNOSIS — R631 Polydipsia: Secondary | ICD-10-CM | POA: Diagnosis present

## 2021-09-24 DIAGNOSIS — F03B18 Unspecified dementia, moderate, with other behavioral disturbance: Secondary | ICD-10-CM | POA: Diagnosis present

## 2021-09-24 DIAGNOSIS — F209 Schizophrenia, unspecified: Secondary | ICD-10-CM

## 2021-09-24 DIAGNOSIS — U071 COVID-19: Secondary | ICD-10-CM | POA: Diagnosis not present

## 2021-09-24 DIAGNOSIS — D72829 Elevated white blood cell count, unspecified: Secondary | ICD-10-CM | POA: Diagnosis not present

## 2021-09-24 DIAGNOSIS — M6282 Rhabdomyolysis: Secondary | ICD-10-CM | POA: Diagnosis not present

## 2021-09-24 DIAGNOSIS — E869 Volume depletion, unspecified: Secondary | ICD-10-CM | POA: Diagnosis not present

## 2021-09-24 DIAGNOSIS — E782 Mixed hyperlipidemia: Secondary | ICD-10-CM | POA: Diagnosis present

## 2021-09-24 DIAGNOSIS — Z79899 Other long term (current) drug therapy: Secondary | ICD-10-CM

## 2021-09-24 DIAGNOSIS — I5022 Chronic systolic (congestive) heart failure: Secondary | ICD-10-CM | POA: Diagnosis present

## 2021-09-24 DIAGNOSIS — Z20822 Contact with and (suspected) exposure to covid-19: Secondary | ICD-10-CM | POA: Diagnosis present

## 2021-09-24 DIAGNOSIS — E871 Hypo-osmolality and hyponatremia: Secondary | ICD-10-CM | POA: Diagnosis present

## 2021-09-24 DIAGNOSIS — R410 Disorientation, unspecified: Secondary | ICD-10-CM

## 2021-09-24 DIAGNOSIS — J189 Pneumonia, unspecified organism: Secondary | ICD-10-CM

## 2021-09-24 DIAGNOSIS — F4310 Post-traumatic stress disorder, unspecified: Secondary | ICD-10-CM | POA: Diagnosis present

## 2021-09-24 DIAGNOSIS — R4182 Altered mental status, unspecified: Secondary | ICD-10-CM | POA: Diagnosis not present

## 2021-09-24 DIAGNOSIS — E872 Acidosis, unspecified: Secondary | ICD-10-CM | POA: Diagnosis not present

## 2021-09-24 DIAGNOSIS — F0390 Unspecified dementia without behavioral disturbance: Secondary | ICD-10-CM | POA: Diagnosis present

## 2021-09-24 DIAGNOSIS — Z751 Person awaiting admission to adequate facility elsewhere: Secondary | ICD-10-CM

## 2021-09-24 DIAGNOSIS — G629 Polyneuropathy, unspecified: Secondary | ICD-10-CM

## 2021-09-24 LAB — RESP PANEL BY RT-PCR (FLU A&B, COVID) ARPGX2
Influenza A by PCR: NEGATIVE
Influenza B by PCR: NEGATIVE
SARS Coronavirus 2 by RT PCR: NEGATIVE

## 2021-09-24 LAB — COMPREHENSIVE METABOLIC PANEL
ALT: 11 U/L (ref 0–44)
AST: 22 U/L (ref 15–41)
Albumin: 3.8 g/dL (ref 3.5–5.0)
Alkaline Phosphatase: 87 U/L (ref 38–126)
Anion gap: 8 (ref 5–15)
BUN: 6 mg/dL (ref 6–20)
CO2: 26 mmol/L (ref 22–32)
Calcium: 8.8 mg/dL — ABNORMAL LOW (ref 8.9–10.3)
Chloride: 95 mmol/L — ABNORMAL LOW (ref 98–111)
Creatinine, Ser: 0.73 mg/dL (ref 0.61–1.24)
GFR, Estimated: >60 mL/min (ref >60)
Glucose, Bld: 89 mg/dL (ref 70–99)
Potassium: 3.8 mmol/L (ref 3.5–5.1)
Sodium: 129 mmol/L — ABNORMAL LOW (ref 135–145)
Total Bilirubin: 0.7 mg/dL (ref 0.3–1.2)
Total Protein: 7.1 g/dL (ref 6.5–8.1)

## 2021-09-24 LAB — CBC WITH DIFFERENTIAL/PLATELET
Abs Immature Granulocytes: 0.05 10*3/uL (ref 0.00–0.07)
Basophils Absolute: 0 10*3/uL (ref 0.0–0.1)
Basophils Relative: 1 %
Eosinophils Absolute: 0.2 10*3/uL (ref 0.0–0.5)
Eosinophils Relative: 2 %
HCT: 40.5 % (ref 39.0–52.0)
Hemoglobin: 13.4 g/dL (ref 13.0–17.0)
Immature Granulocytes: 1 %
Lymphocytes Relative: 31 %
Lymphs Abs: 2.6 10*3/uL (ref 0.7–4.0)
MCH: 22.9 pg — ABNORMAL LOW (ref 26.0–34.0)
MCHC: 33.1 g/dL (ref 30.0–36.0)
MCV: 69.1 fL — ABNORMAL LOW (ref 80.0–100.0)
Monocytes Absolute: 1 10*3/uL (ref 0.1–1.0)
Monocytes Relative: 12 %
Neutro Abs: 4.4 10*3/uL (ref 1.7–7.7)
Neutrophils Relative %: 53 %
Platelets: 232 10*3/uL (ref 150–400)
RBC: 5.86 MIL/uL — ABNORMAL HIGH (ref 4.22–5.81)
RDW: 15.2 % (ref 11.5–15.5)
WBC: 8.2 10*3/uL (ref 4.0–10.5)
nRBC: 0 % (ref 0.0–0.2)

## 2021-09-24 LAB — ETHANOL: Alcohol, Ethyl (B): <10 mg/dL (ref <10)

## 2021-09-24 NOTE — ED Notes (Signed)
Contacted sister, [name] at [phone], that patient has been discharged and she can come pick the patient up. Sister states that she can take the patient back home because she is no longer able to take care of him.

## 2021-09-24 NOTE — ED Triage Notes (Signed)
Pt with hx dementia and schizophrenia, called ems due to family report of unable to sit still, drinking a lot of water.  Follows commands, cooperative, denies pain, recent hospitalization, discharged home

## 2021-09-24 NOTE — Care Management (Addendum)
Patient has been in numerous times for same overall actions. Disruptive at home. Sister and mother said last week that they cannot take care of him. He has been in group homes and been discharged from them due to behavior. Most recently seen and assessed  in the ED 09/12/2020 At that time sister was given resources for [place], and encouraged by CSW to apply for Medicaid. Patient was assessed by [facility] and once again was not deemed as not needing Inpatient psychiatric treatment.  Patient states reason for visit today was that "My mom can't take care of me anymore, you have to keep me.  Evaluation will be done by provider to assess any acute need or [facility] consult.

## 2021-09-24 NOTE — Discharge Instructions (Addendum)
Take your medicines as they have been prescribed.  Follow-up with your therapist, and psychiatrist for further care as scheduled.

## 2021-09-24 NOTE — ED Notes (Signed)
Pt calm, cooperative with care on arrival.  States reason for being here "My mom can't take care of me anymore, you have to keep me"

## 2021-09-24 NOTE — ED Provider Notes (Addendum)
[facility] EMERGENCY DEPARTMENT Provider Note   CSN: [id number] Arrival date & time: 09/24/21  1533     History  Chief Complaint  Patient presents with   Altered Mental Status    Abnormal labs    Jim Dunn is a 56 y.o. male.  HPI He presents by EMS for evaluation of altered behavior, which family attributes to his sodium being low.  They reported that he has not been able to sit still and has been drinking lots of water.  The patient was  recently discharged from the behavioral health urgent care.  He states that now he is seeing things such as a woman he knew who has died.  He states that "I just do not want to be around."  He denied homicidal ideation or plan.  He denies suicidal plan.    Home Medications Prior to Admission medications   Medication Sig Start Date End Date Taking? Authorizing Provider  acetaminophen (TYLENOL) 325 MG tablet Take 2 tablets (650 mg total) by mouth every 4 (four) hours as needed for mild pain or fever. Patient not taking: Reported on 09/11/2021 06/07/20   Russella Dar, NP  atorvastatin (LIPITOR) 40 MG tablet Take 1 tablet (40 mg total) by mouth daily. Patient taking differently: Take 40 mg by mouth every morning. 08/03/21   Blane Ohara, MD  busPIRone (BUSPAR) 10 MG tablet Take 1 tablet (10 mg total) by mouth 3 (three) times daily. 08/03/21   Blane Ohara, MD  chlorproMAZINE (THORAZINE) 100 MG tablet Take 1 tablet (100 mg total) by mouth 3 (three) times daily. Patient not taking: Reported on 09/11/2021 08/03/21   Blane Ohara, MD  diazepam (VALIUM) 2 MG tablet Take 1 tablet (2 mg total) by mouth 2 (two) times daily. Patient not taking: Reported on 09/11/2021 08/03/21   Blane Ohara, MD  digoxin (LANOXIN) 0.25 MG tablet Take 1 tablet (0.25 mg total) by mouth daily. Patient taking differently: Take 0.25 mg by mouth every morning. 08/03/21   Blane Ohara, MD  divalproex (DEPAKOTE ER) 500 MG 24 hr tablet Take 2 tablets (1,000  mg total) by mouth at bedtime. Patient taking differently: Take 500 mg by mouth 2 (two) times daily. 08/03/21   Blane Ohara, MD  furosemide (LASIX) 20 MG tablet Take 1 tablet (20 mg total) by mouth daily. Patient not taking: Reported on 09/11/2021 08/03/21   Blane Ohara, MD  [facility] SUSTENNA 234 MG/1.5ML SUSY injection Inject 234 mg into the muscle every 30 (thirty) days. Patient not taking: Reported on 09/11/2021 06/07/20   Russella Dar, NP  nicotine (NICODERM CQ) 14 mg/24hr patch Place 1 patch (14 mg total) onto the skin daily. 08/03/21   Blane Ohara, MD  paliperidone (INVEGA) 6 MG 24 hr tablet Take 1 tablet (6 mg total) by mouth at bedtime. Patient not taking: Reported on 09/11/2021 08/03/21   Blane Ohara, MD  phenytoin (DILANTIN) 300 MG ER capsule Take 1 capsule (300 mg total) by mouth 2 (two) times daily. Patient not taking: Reported on 09/11/2021 08/03/21   Blane Ohara, MD  QUEtiapine (SEROQUEL) 200 MG tablet Take 1 tablet (200 mg total) by mouth daily. Patient not taking: Reported on 09/11/2021 08/03/21   Blane Ohara, MD  sacubitril-valsartan (ENTRESTO) 24-26 MG Take 1 tablet by mouth 2 (two) times daily. 08/03/21   Blane Ohara, MD      Allergies    Patient has no known allergies.    Review of Systems   Review of Systems  All other systems reviewed and are negative.  Physical Exam Updated Vital Signs BP (!) 153/88    Pulse 94    Resp 18    SpO2 100%  Physical Exam Vitals and nursing note reviewed.  Constitutional:      General: He is not in acute distress.    Appearance: He is well-developed. He is not ill-appearing, toxic-appearing or diaphoretic.  HENT:     Head: Normocephalic and atraumatic.     Right Ear: External ear normal.     Left Ear: External ear normal.     Mouth/Throat:     Mouth: Mucous membranes are moist.     Pharynx: No oropharyngeal exudate or posterior oropharyngeal erythema.  Eyes:     Conjunctiva/sclera: Conjunctivae normal.     Pupils:  Pupils are equal, round, and reactive to light.  Neck:     Trachea: Phonation normal.  Cardiovascular:     Rate and Rhythm: Normal rate and regular rhythm.  Pulmonary:     Effort: Pulmonary effort is normal.  Abdominal:     General: There is no distension.  Musculoskeletal:        General: Normal range of motion.     Cervical back: Normal range of motion and neck supple.     Comments: Normal gait  Skin:    General: Skin is warm and dry.  Neurological:     Mental Status: He is alert and oriented to person, place, and time.     Cranial Nerves: No cranial nerve deficit.     Sensory: No sensory deficit.     Motor: No abnormal muscle tone.     Coordination: Coordination normal.  Psychiatric:        Attention and Perception: He is attentive.        Mood and Affect: Mood normal. Mood is not anxious or depressed. Affect is not labile.        Speech: Speech normal. He is communicative.        Behavior: Behavior normal. Behavior is not agitated, slowed, aggressive or withdrawn. Behavior is cooperative.        Cognition and Memory: Cognition normal. Cognition is not impaired. Memory is not impaired.        Judgment: Judgment is impulsive.    ED Results / Procedures / Treatments   Labs (all labs ordered are listed, but only abnormal results are displayed) Labs Reviewed  CBC WITH DIFFERENTIAL/PLATELET - Abnormal; Notable for the following components:      Result Value   RBC 5.86 (*)    MCV 69.1 (*)    MCH 22.9 (*)    All other components within normal limits  COMPREHENSIVE METABOLIC PANEL - Abnormal; Notable for the following components:   Sodium 129 (*)    Chloride 95 (*)    Calcium 8.8 (*)    All other components within normal limits  RESP PANEL BY RT-PCR (FLU A&B, COVID) ARPGX2  ETHANOL  RAPID URINE DRUG SCREEN, HOSP PERFORMED    EKG EKG Interpretation  Date/Time:  Saturday September 24 2021 15:47:51 EST Ventricular Rate:  94 PR Interval:  207 QRS Duration: 98 QT  Interval:  363 QTC Calculation: 454 R Axis:   100 Text Interpretation: Sinus rhythm Prolonged PR interval Left atrial enlargement Right axis deviation Consider left ventricular hypertrophy Nonspecific T abnormalities, lateral leads Since last tracing PR interval is longer Otherwise no significant change Confirmed by Mancel Bale [phone]) on 09/24/2021 5:03:16 PM  Radiology No results found.  Procedures  Procedures    Medications Ordered in ED Medications - No data to display  ED Course/ Medical Decision Making/ A&P Clinical Course as of 09/24/21 2103  Sat Sep 24, 2021  2100 Patient was discharged and nursing called his sister to come and get him.  The sister, [name], called me to explain her concerns.  She states that she picked the patient up in [place], several months ago and began taking care of him.  She was able to get into a psychiatrist and getting back on his medicines which seem to help.  In the last several weeks he has decompensated and been showing bizarre behavior including burning paper on the stove, being distracted, wandering outside without proper clothing on and scaring his mother.  Currently [name] is out of town and will not be back until 2 days from now.  Patient's caregiver at home currently is his mother who is elderly and frail.  Last [facility] and began to wander outside of the facility so family members were called to come and get him.  [name] does not feel like her brother would be safe at their home.  She thinks that he may have dementia.  She understands that we want her to come and get him but she does not plan on doing that.  She understands that Adult Protective Services will be contacted regarding that action.  She states she is already been in contact with Adult Protective Services. [EW]  2103 I have asked the nurse to contact our social worker to let them know the current status and the patient's need for  connection with Adult Protective Services system. [EW]    Clinical Course User Index [EW] Mancel Bale, MD                           Medical Decision Making Problems Addressed: Schizophrenia, unspecified type [facility]): chronic illness or injury  Amount and/or Complexity of Data Reviewed External Data Reviewed: notes.    Details: He was evaluated at the behavioral health urgent care, on 09/18/2021.  At that time it was noted that he had had multiple encounters with both ED services and psychiatric services recently.  He has been repeatedly psychiatrically cleared.  At that point they were trying to arrange for outpatient services including application for Medicaid and DSS guardianship.  It was noted that there is family discord which makes it difficult for the patient to live with his sister.  She has demanded that he leave the home.  The patient is having conflicts with his sister son. Labs: ordered.    Details: Blood count, metabolic panel-normal except sodium low, chloride low, calcium low.  These electrolyte abnormalities are improved from most recent test, 12 days ago.  They do not require immediate intervention or treatment.  Risk Decision regarding hospitalization. Risk Details: Patient is stable for discharge.  He has benign rumination on suicidality, and is not overtly psychotic or unstable at this time.  He is having family discord issues, that are interfering with his ability to be managed in the outpatient setting.  Here in the ED, he was offered discharge home versus go to the behavioral [facility].  He chose to go home.  Nursing is working on getting him discharged.  Social work has been consulted and are aware of the patient and will help as needed.  They have stated that they may do an APS report  if the family members do not come and get him.           Final Clinical Impression(s) / ED Diagnoses Final diagnoses:  Schizophrenia, unspecified type [facility])    Rx / DC Orders ED  Discharge Orders     None         Mancel Bale, MD 09/24/21 2026    Mancel Bale, MD 09/24/21 2103

## 2021-09-25 DIAGNOSIS — R4182 Altered mental status, unspecified: Secondary | ICD-10-CM | POA: Diagnosis not present

## 2021-09-25 DIAGNOSIS — E871 Hypo-osmolality and hyponatremia: Secondary | ICD-10-CM | POA: Diagnosis not present

## 2021-09-25 DIAGNOSIS — F200 Paranoid schizophrenia: Secondary | ICD-10-CM | POA: Diagnosis not present

## 2021-09-25 DIAGNOSIS — R413 Other amnesia: Secondary | ICD-10-CM | POA: Diagnosis present

## 2021-09-25 DIAGNOSIS — F0390 Unspecified dementia without behavioral disturbance: Secondary | ICD-10-CM | POA: Diagnosis present

## 2021-09-25 LAB — HIV ANTIBODY (ROUTINE TESTING W REFLEX): HIV Screen 4th Generation wRfx: NONREACTIVE

## 2021-09-25 LAB — DIGOXIN LEVEL: Digoxin Level: <0.2 ng/mL — ABNORMAL LOW (ref 0.8–2.0)

## 2021-09-25 LAB — VITAMIN B12: Vitamin B-12: 698 pg/mL (ref 180–914)

## 2021-09-25 LAB — SODIUM: Sodium: 129 mmol/L — ABNORMAL LOW (ref 135–145)

## 2021-09-25 LAB — BASIC METABOLIC PANEL
Anion gap: 4 — ABNORMAL LOW (ref 5–15)
BUN: 5 mg/dL — ABNORMAL LOW (ref 6–20)
CO2: 25 mmol/L (ref 22–32)
Calcium: 8.5 mg/dL — ABNORMAL LOW (ref 8.9–10.3)
Chloride: 96 mmol/L — ABNORMAL LOW (ref 98–111)
Creatinine, Ser: 0.57 mg/dL — ABNORMAL LOW (ref 0.61–1.24)
GFR, Estimated: >60 mL/min (ref >60)
Glucose, Bld: 137 mg/dL — ABNORMAL HIGH (ref 70–99)
Potassium: 3.6 mmol/L (ref 3.5–5.1)
Sodium: 125 mmol/L — ABNORMAL LOW (ref 135–145)

## 2021-09-25 LAB — SODIUM, URINE, RANDOM: Sodium, Ur: 63 mmol/L

## 2021-09-25 LAB — AMMONIA: Ammonia: 42 umol/L — ABNORMAL HIGH (ref 9–35)

## 2021-09-25 LAB — TSH: TSH: 0.907 u[IU]/mL (ref 0.350–4.500)

## 2021-09-25 LAB — VALPROIC ACID LEVEL: Valproic Acid Lvl: 19 ug/mL — ABNORMAL LOW (ref 50.0–100.0)

## 2021-09-25 LAB — OSMOLALITY, URINE: Osmolality, Ur: 492 mOsm/kg (ref 300–900)

## 2021-09-25 LAB — OSMOLALITY: Osmolality: 262 mOsm/kg — ABNORMAL LOW (ref 275–295)

## 2021-09-25 MED ORDER — ACETAMINOPHEN 650 MG RE SUPP: 650.0000 mg | Freq: Four times a day (QID) | RECTAL | Status: DC | PRN

## 2021-09-25 MED ORDER — ATORVASTATIN CALCIUM 40 MG PO TABS
40.0000 mg | ORAL_TABLET | Freq: Every day | ORAL | Status: DC
  Administered 2021-09-25 – 2022-04-07 (×195): 40 mg via ORAL
  Filled 2021-09-25 (×195): qty 1

## 2021-09-25 MED ORDER — SODIUM CHLORIDE 0.9% FLUSH
3.0000 mL | Freq: Two times a day (BID) | INTRAVENOUS | Status: DC
  Administered 2021-09-25 – 2021-10-03 (×14): 3 mL via INTRAVENOUS

## 2021-09-25 MED ORDER — NICOTINE 14 MG/24HR TD PT24
14.0000 mg | MEDICATED_PATCH | Freq: Every day | TRANSDERMAL | Status: DC
  Administered 2021-09-25 – 2021-11-15 (×48): 14 mg via TRANSDERMAL
  Filled 2021-09-25 (×53): qty 1

## 2021-09-25 MED ORDER — HYDRALAZINE HCL 20 MG/ML IJ SOLN: 5.0000 mg | Freq: Four times a day (QID) | INTRAMUSCULAR | Status: DC | PRN

## 2021-09-25 MED ORDER — ACETAMINOPHEN 325 MG PO TABS
650.0000 mg | ORAL_TABLET | Freq: Four times a day (QID) | ORAL | Status: DC | PRN
  Administered 2021-10-02 – 2022-03-20 (×20): 650 mg via ORAL
  Filled 2021-09-25 (×20): qty 2

## 2021-09-25 MED ORDER — SACUBITRIL-VALSARTAN 24-26 MG PO TABS
1.0000 | ORAL_TABLET | Freq: Two times a day (BID) | ORAL | Status: DC
  Administered 2021-09-25 – 2021-10-31 (×72): 1 via ORAL
  Filled 2021-09-25 (×78): qty 1

## 2021-09-25 MED ORDER — ENOXAPARIN SODIUM 40 MG/0.4ML IJ SOSY
40.0000 mg | PREFILLED_SYRINGE | INTRAMUSCULAR | Status: DC
  Administered 2021-09-25 – 2021-10-31 (×28): 40 mg via SUBCUTANEOUS
  Filled 2021-09-25 (×35): qty 0.4

## 2021-09-25 MED ORDER — PALIPERIDONE ER 3 MG PO TB24
3.0000 mg | ORAL_TABLET | Freq: Every day | ORAL | Status: DC
  Administered 2021-09-25 – 2021-12-15 (×82): 3 mg via ORAL
  Filled 2021-09-25 (×85): qty 1

## 2021-09-25 MED ORDER — SODIUM CHLORIDE 0.9 % IV SOLN: INTRAVENOUS | Status: DC

## 2021-09-25 MED ORDER — THIAMINE HCL 100 MG PO TABS
100.0000 mg | ORAL_TABLET | Freq: Every day | ORAL | Status: DC
Start: 2021-09-25 — End: 2021-09-25

## 2021-09-25 MED ORDER — SODIUM CHLORIDE 0.9 % IV SOLN: 250.0000 mL | INTRAVENOUS | Status: DC | PRN

## 2021-09-25 MED ORDER — DIGOXIN 125 MCG PO TABS
0.2500 mg | ORAL_TABLET | Freq: Every day | ORAL | Status: DC
  Administered 2021-09-25 – 2022-04-07 (×195): 0.25 mg via ORAL
  Filled 2021-09-25: qty 1
  Filled 2021-09-25 (×6): qty 2
  Filled 2021-09-25: qty 1
  Filled 2021-09-25 (×188): qty 2

## 2021-09-25 MED ORDER — SODIUM CHLORIDE 0.9% FLUSH
3.0000 mL | INTRAVENOUS | Status: DC | PRN
  Administered 2021-09-25: 3 mL via INTRAVENOUS

## 2021-09-25 MED ORDER — DIVALPROEX SODIUM ER 500 MG PO TB24
500.0000 mg | ORAL_TABLET | Freq: Two times a day (BID) | ORAL | Status: DC
  Administered 2021-09-25 – 2022-04-07 (×388): 500 mg via ORAL
  Filled 2021-09-25 (×394): qty 1

## 2021-09-25 MED ORDER — MIRTAZAPINE 15 MG PO TABS
15.0000 mg | ORAL_TABLET | Freq: Every day | ORAL | Status: DC
  Administered 2021-09-25 – 2022-04-06 (×194): 15 mg via ORAL
  Filled 2021-09-25 (×195): qty 1

## 2021-09-25 MED ORDER — THIAMINE HCL 100 MG PO TABS
100.0000 mg | ORAL_TABLET | Freq: Every day | ORAL | Status: DC
  Administered 2021-09-25 – 2021-09-27 (×3): 100 mg via ORAL
  Filled 2021-09-25 (×3): qty 1

## 2021-09-25 NOTE — Assessment & Plan Note (Addendum)
Continue Depakote-valproic acid level 41 previous level on 2/10 was 0.2 History of nonadherence and valproic acid on 1/22 was 19 EEG negative for acute seizure activity Continue seizure precautions Depakote can also function as a mood stabilizer MRI revealed arachnoid cyst that is chronic in nature S/p  grand mal seizure with postictal phase on the evening of 2/26 presumed to be precipitated/provoked by abrupt hyponatremia with sodium less than 120.  Valproic acid level 58

## 2021-09-25 NOTE — Assessment & Plan Note (Addendum)
Continue lipitor Apparently has been nonadherent with medicines prior to admission

## 2021-09-25 NOTE — Assessment & Plan Note (Addendum)
During initial admission over 1 year ago had formal neuropsychiatric evaluation by Dr. Kieth Brightly.  Refer to that evaluation on 05/27/20 in which patient had scores in the severely impaired/extremely low range across all indices in the RBANS B neuropsychological test battery.    His diagnosis was major neurocognitive disorder (nonprogressive dementia) and post-traumatic stress disorder.

## 2021-09-25 NOTE — ED Notes (Signed)
MD notified of BP 184/107. No new orders at this time.

## 2021-09-25 NOTE — ED Notes (Signed)
Pt repeatedly asking to use phone. Pt redirected back to room.

## 2021-09-25 NOTE — Assessment & Plan Note (Addendum)
Appreciate psychiatric team help Schizophrenia symptoms and decompensation worsened by recurrent hyponatremia Also has a component of anxiety and during previous admission requested antianxiety medications Continue Seroquel and Invega Discontinuing Vistaril in favor of Xanax which has previously been well effective for this patient Have allowed IVC to expire since no longer needed Patient is very appreciative of LCSW [name] assistance

## 2021-09-25 NOTE — ED Notes (Signed)
Patient refused cardiac monitor, pulled off leads

## 2021-09-25 NOTE — Progress Notes (Signed)
CSW spoke with patient's sister [name] who states she cannot care for her brother any longer at home. [name] states she and her mother are unable to control the patient and keep him safe. [name] reports the patient goes daily to the [facility]. [name] states the patient wanders off and goes missing and is not safe to be alone. [name] states she made an APS report on Friday on the patient's behalf stating he is at harm to himself and others - the report was accepted. [name] states she has not received a return call from APS staff as of yet. [name] reports having contacted several local agencies for assistance with placement and is aware the patient will need Medicaid for long term placement.  CSW spoke with patient's mother who states she is unable to care for the patient at home due to his behaviors and aggression.  [name], MSW, LCSW Transitions of Care   Clinical Social Worker II [phone]

## 2021-09-25 NOTE — H&P (Signed)
History and Physical    Arn Mcomber FUX:[id number] DOB: August 21, 1966 DOA: 09/24/2021  PCP: Pcp, No Consultants:   Patient coming from:  Home - lives with sister and mother   Chief Complaint: originally presented with altered behavior   HPI: Jim Dunn is a 56 y.o. male with medical history significant of chronic hyponatremia, HTN, paranoid schizophrenia, chronic systolic CHF, hx of seizures, COPD, alcohol/drug abuse, CKD, tobacco abuse presenting to ED with altered behavior on 09/24/21. Family thought his behavior off due to possible low sodium and he has been drinking a lot of water. Sodium was around his baseline at time of admit, 129 (baseline 120-126). His sodium has dropped to 125 and he endorses visual hallucinations of seeing a dead woman he knows and hearing her tell him she loves him. His family also does not feel like they can take care of him. Psychiatry has recommended admit for further care.   Per his sister her biggest concern is he burns a lot of things and urinates on things and can not remember anything. He has scared his neighbors and at times his mother. He needs constant supervision   She noticed a change in his behavior on Thursday. He had wandered off four times, put his mouth on the faucet to drink waters (constantly drinking water), was very quiet which was abnormal for him and had poor PO intake. She also has noticed that his memory has declined even more from his baseline. He has always needed help with  his ADLs, but it's gotten worse and he seems to be like a "two year old."   He denies any headaches, visual changes, chest pain or palpitations, shortness of breath or cough, abdominal pain, N/V/D, dysuria or leg swelling.   ED Course: vitals: temp: 99.2, bp: 153/88, HR: 88, RR: 15, oxygen: 95% RA Pertinent labs: sodium: 129>125 (baseline 121-126), ethanol negative, valproic acid 19, osmolality 262 In ED: psychiatry consulted, SW and SLP consulted. Started on IVF and TRH  was asked to admit.   Review of Systems: As per HPI; otherwise review of systems reviewed and negative.   Ambulatory Status:  Ambulates without assistance   Past Medical History:  Diagnosis Date   CHF (congestive heart failure) (HCC)    COPD (chronic obstructive pulmonary disease) (HCC)    History of kidney stones    Hyponatremia    Paranoid schizophrenia (HCC)    Seizures (HCC)    Tobacco abuse     Past Surgical History:  Procedure Laterality Date   KIDNEY STONE SURGERY     MULTIPLE EXTRACTIONS WITH ALVEOLOPLASTY Bilateral 04/29/2020   Procedure: Extraction of tooth #'s 2-13, 17,18, 21-25, and 27-31 with alveoloplasty and bilateral lingual exostoses reductions.;  Surgeon: [name], DDS;  Location: [facility] OR;  Service: Oral Surgery;  Laterality: Bilateral;    Social History   Socioeconomic History   Marital status: Single    Spouse name: Not on file   Number of children: Not on file   Years of education: Not on file   Highest education level: Not on file  Occupational History   Occupation: Disability  Tobacco Use   Smoking status: Every Day    Packs/day: 1.00    Types: Cigarettes   Smokeless tobacco: Current    Types: Chew   Tobacco comments:    2-3 cigarettes smoked daily 07/22/20 [facility]   Vaping Use   Vaping Use: Never used  Substance and Sexual Activity   Alcohol use: Not Currently   Drug  use: Never   Sexual activity: Not Currently  Other Topics Concern   Not on file  Social History Narrative   Pt lives [facility].  He receives outpatient psychiatric services through the [facility].  He stated that prior to moving there, he did not have a psychiatrist.     Social Determinants of Health   Financial Resource Strain: Not on file  Food Insecurity: Not on file  Transportation Needs: Not on file  Physical Activity: Not on file  Stress: Not on file  Social Connections: Not on file  Intimate Partner Violence: Not on file    No Known Allergies  Family  History  Problem Relation Age of Onset   Heart disease Other     Prior to Admission medications   Medication Sig Start Date End Date Taking? Authorizing Provider  acetaminophen (TYLENOL) 325 MG tablet Take 2 tablets (650 mg total) by mouth every 4 (four) hours as needed for mild pain or fever. Patient not taking: Reported on 09/11/2021 06/07/20   Russella Dar, NP  atorvastatin (LIPITOR) 40 MG tablet Take 1 tablet (40 mg total) by mouth daily. Patient taking differently: Take 40 mg by mouth every morning. 08/03/21   Blane Ohara, MD  busPIRone (BUSPAR) 10 MG tablet Take 1 tablet (10 mg total) by mouth 3 (three) times daily. 08/03/21   Blane Ohara, MD  chlorproMAZINE (THORAZINE) 100 MG tablet Take 1 tablet (100 mg total) by mouth 3 (three) times daily. Patient not taking: Reported on 09/11/2021 08/03/21   Blane Ohara, MD  diazepam (VALIUM) 2 MG tablet Take 1 tablet (2 mg total) by mouth 2 (two) times daily. Patient not taking: Reported on 09/11/2021 08/03/21   Blane Ohara, MD  digoxin (LANOXIN) 0.25 MG tablet Take 1 tablet (0.25 mg total) by mouth daily. Patient taking differently: Take 0.25 mg by mouth every morning. 08/03/21   Blane Ohara, MD  divalproex (DEPAKOTE ER) 500 MG 24 hr tablet Take 2 tablets (1,000 mg total) by mouth at bedtime. Patient taking differently: Take 500 mg by mouth 2 (two) times daily. 08/03/21   Blane Ohara, MD  furosemide (LASIX) 20 MG tablet Take 1 tablet (20 mg total) by mouth daily. Patient not taking: Reported on 09/11/2021 08/03/21   Blane Ohara, MD  [facility] SUSTENNA 234 MG/1.5ML SUSY injection Inject 234 mg into the muscle every 30 (thirty) days. Patient not taking: Reported on 09/11/2021 06/07/20   Russella Dar, NP  nicotine (NICODERM CQ) 14 mg/24hr patch Place 1 patch (14 mg total) onto the skin daily. 08/03/21   Blane Ohara, MD  paliperidone (INVEGA) 6 MG 24 hr tablet Take 1 tablet (6 mg total) by mouth at bedtime. Patient not taking:  Reported on 09/11/2021 08/03/21   Blane Ohara, MD  phenytoin (DILANTIN) 300 MG ER capsule Take 1 capsule (300 mg total) by mouth 2 (two) times daily. Patient not taking: Reported on 09/11/2021 08/03/21   Blane Ohara, MD  QUEtiapine (SEROQUEL) 200 MG tablet Take 1 tablet (200 mg total) by mouth daily. Patient not taking: Reported on 09/11/2021 08/03/21   Blane Ohara, MD  sacubitril-valsartan (ENTRESTO) 24-26 MG Take 1 tablet by mouth 2 (two) times daily. 08/03/21   Blane Ohara, MD    Physical Exam: Vitals:   09/25/21 1515 09/25/21 1530 09/25/21 1840 09/25/21 2010  BP: (!) 172/91 (!) 173/102  (!) 173/93  Pulse: 74 83 72 80  Resp: 16 14  18   Temp:    98.4 F (36.9 C)  TempSrc:    Oral  SpO2: 98% 98%  98%     General:  Appears calm and comfortable and is in NAD Eyes:  PERRL, EOMI, normal lids, iris ENT:  grossly normal hearing, lips & tongue, mmm; missing teeth  Neck:  no LAD, masses or thyromegaly; no carotid bruits Cardiovascular:  RRR, no m/r/g. No LE edema.  Respiratory:   CTA bilaterally with no wheezes/rales/rhonchi.  Normal respiratory effort. Abdomen:  soft, NT, ND, NABS Back:   normal alignment, no CVAT Skin:  no rash or induration seen on limited exam Musculoskeletal:  grossly normal tone BUE/BLE, good ROM, no bony abnormality Lower extremity:  No LE edema.  Limited foot exam with no ulcerations.  2+ distal pulses. Psychiatric:  grossly normal mood and affect, speech fluent and appropriate, A&) x2. Does not know date.  Neurologic:  CN 2-12 grossly intact, moves all extremities in coordinated fashion, sensation intact    Radiological Exams on Admission: Independently reviewed - see discussion in A/P where applicable  No results found.  EKG: Independently reviewed.  NSR with rate 94; nonspecific ST changes with no evidence of acute ischemia. Prolonged pr, longer than previous ekg.    Labs on Admission: I have personally reviewed the available labs and imaging  studies at the time of the admission.  Pertinent labs:  sodium: 129>125 (baseline 121-126),  ethanol negative,  valproic acid 19,  osmolality 262   Assessment/Plan * Chronic hyponatremia- (present on admission) 56 year old with acute change in normal mental status with increased polydipsia presenting with hyponatremia.  -baseline 120-126 -he appears to be at his baseline, doubt that his increasing memory loss is due to this.  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 Urine studies pending from ED.stop IVF, does not appear dehydrated or volume overloaded from his CHF -fluid restriction  -trend sodium -? 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  Will ask neurology to see to make recommendations from their point of view Checking ammonia, TSH and B12.  Ethanol wnl, UDS pending History of heavy drinking in past, check thiamine level, start thiamine  Will check brain MRI with and without contrast, can't not see that this has been done.   Chronic systolic CHF (congestive heart failure) (HCC)- (present on admission) Appears euvolemic Continue home meds of entresto, digoxin and waiting on MAR to see if taking lasix. Digoxin level subtherapeutic  Strict I/O and daily weights Fluid restriction for chronic hyponatremia Last echo: 5/21 with EF of 25-30% with global hypokinesis, RV overload and indeterminate diastolic dysfunction. Does  not appear that he has followed up with cardiology. Gets meds from ED.  Will recheck echo since being admitted and will hopefully get him set up with outpatient cardiology  Seizure disorder [facility]) Receiving depakote  BID. Unsure if seizures were due to hyponatremia vs. Alcohol or true seizure disorder. His depakote level is sub therapeutic Will consult neurology regarding this as well as worsening memory/subarchanoid cyst Seizure precautions  Mixed  hyperlipidemia- (present on admission) Continue lipitor, do not think he has been taking       There is no height or weight on file to calculate BMI.    Level of care: Telemetry Medical DVT prophylaxis:  Lovenox  Code Status:  Full - confirmed with patient Family Communication: None present; I spoke with the patient's sister by telephone at the time of admission. [name] [phone] Disposition Plan:  The patient is from: home  Anticipated d/c is to:SNF  Patient placed in observation as anticipate less than 2 midnight stay. Requires hospitalization due to worsening memory decline, inability to care for himself and current hallucinations. Not safe to return home.    Patient is currently: stable  Consults called: psychiatry consult, SW, SLP, neurology: Dr. Derry Lory  Admission status:  observation    Orland Mustard MD [facility]   How to contact the [facility] Attending or Consulting provider 7A - 7P or covering provider during after hours 7P -7A, for this patient?  Check the care team in [facility] and look for a) attending/consulting [facility] provider listed and b) the [facility] team listed Log into [email or web address] and use [place]'s universal password to access. If you do not have the password, please contact the hospital operator. Locate the [facility] provider you are looking for under [facility] and page to a number that you can be directly reached. If you still have difficulty reaching the provider, please page the [facility] (Director on  Call) for the Hospitalists listed on amion for assistance.   09/25/2021, 8:48 PM

## 2021-09-25 NOTE — Assessment & Plan Note (Addendum)
Continue digoxin and Entresto  Lasix discontinued during recent episode of acute hyponatremia with seizure activity as well as hypotension we will continue to hold until CK normalized Digoxin level subtherapeutic as of 2/10 but this also appears to be a chronic issue.  Follow periodically Strict I/O and daily weights Fluid restriction for chronic hyponatremia for free water primarily-allowing gatorade and other electrolyte based fluids to not be included in FR Last echo: 5/21 with EF of 25-30% with global hypokinesis, RV overload and indeterminate diastolic dysfunction. Does not appear that he has followed up with cardiology. Gets meds from ED.  Echocardiogram performed on 09/26/2021 EF of 30-35% with moderately decreased systolic function and global hypokinesis. RV size and systolic function is normal. Weight measurements have been inconsistent and appear to be inaccurate

## 2021-09-25 NOTE — ED Provider Notes (Signed)
Emergency Medicine Observation Re-evaluation Note  Jim Dunn is a 56 y.o. male, seen on rounds today.  Pt initially presented to the ED for complaints of Altered Mental Status (Abnormal labs) Currently, the patient is resting comfortably in bed.  Physical Exam  BP (!) 157/100 (BP Location: Right Arm)    Pulse 88    Temp 99.2 F (37.3 C) (Oral)    Resp 15    SpO2 95%  Physical Exam General: Nontoxic appearing Cardiac: Normal heart rate Lungs: No respiratory Psych: No overt internal responsiveness  ED Course / MDM  EKG:EKG Interpretation  Date/Time:  Saturday September 24 2021 15:47:51 EST Ventricular Rate:  94 PR Interval:  207 QRS Duration: 98 QT Interval:  363 QTC Calculation: 454 R Axis:   100 Text Interpretation: Sinus rhythm Prolonged PR interval Left atrial enlargement Right axis deviation Consider left ventricular hypertrophy Nonspecific T abnormalities, lateral leads Since last tracing PR interval is longer Otherwise no significant change Confirmed by Daleen Bo [phone]) on 09/24/2021 5:03:16 PM  I have reviewed the labs performed to date as well as medications administered while in observation.  Recent changes in the last 24 hours include patient has been wandering some in the ED but has been redirectable.  Social worker has not been able to convince family members to come and get him.  His clinical examination is unchanged from yesterday.  He appears to have schizophrenia with chronic stable hallucinations and some delusional thoughts.  He does not appear to be overtly risk to himself at this time.  Plan  Current plan is for continued efforts to find a safe place for him to reside.  The best thing for him to do would be to go home with family members but they do not seem to be able to do there.  That patient's sister who was watching over him, did do an APS report 2 days ago by her report.  Jim Dunn is not under involuntary commitment.  2:35 PM-additional testing  indicates that the patient's sodium has dropped from 129-125 today.  He has been seen by psychiatry who recommends hospitalization for further evaluation.  They will see as a Optometrist.  I have added on digoxin and Depakote levels.  We will start IV normal saline.  We will attempt to get urine to check hyponatremia parameters.   2:40 PM-Consult complete with hospitalist. Patient case explained and discussed.  She agrees to admit patient for further evaluation and treatment. Call ended at 4:20 PM   Daleen Bo, MD 09/25/21 [phone]

## 2021-09-25 NOTE — ED Notes (Signed)
Pt asked to use phone. Pt provided with phone. Pt began asking this RN questions about which hospital he is at, phone number to reach him, etc. Pt found to be on line with 911 operator. Per 911 operator, pt told them to "send officers to take him out of this world." Upon talking with pt about same, he again states that he "cannot handle being here" and he is "ready to be taken out." Pt tearful, and is asking the best way to not live anymore. Pt reports that he "saw someone die and now can't deal with it." Charge RN made aware of comments. Will notify MD.

## 2021-09-25 NOTE — ED Notes (Signed)
Pt woke up, asking for more crackers and a soda. RN gave pt crackers and a diet cola, pt became tearful asking if he could be put to sleep, stating "I dont wanna go out there no more". RN told pt that he could sleep in his room, pt agreeable and laid in bed. New warm blanket given.
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
## 2021-09-25 NOTE — Evaluation (Signed)
Clinical/Bedside Swallow Evaluation Patient Details  Name: Jim Dunn MRN: [id number] Date of Birth: 1966-08-15  Today's Date: 09/25/2021 Time: SLP Start Time (ACUTE ONLY): 1533 SLP Stop Time (ACUTE ONLY): 1540 SLP Time Calculation (min) (ACUTE ONLY): 7 min  Past Medical History:  Past Medical History:  Diagnosis Date   CHF (congestive heart failure) (HCC)    COPD (chronic obstructive pulmonary disease) (HCC)    History of kidney stones    Hyponatremia    Paranoid schizophrenia (HCC)    Seizures (HCC)    Tobacco abuse    Past Surgical History:  Past Surgical History:  Procedure Laterality Date   KIDNEY STONE SURGERY     MULTIPLE EXTRACTIONS WITH ALVEOLOPLASTY Bilateral 04/29/2020   Procedure: Extraction of tooth #'s 2-13, 17,18, 21-25, and 27-31 with alveoloplasty and bilateral lingual exostoses reductions.;  Surgeon: [name], DDS;  Location: [facility] OR;  Service: Oral Surgery;  Laterality: Bilateral;   HPI:  Jim Dunn is a 56 yo male who presented by EMS for evaluation of altered behavior, which family attributes to his sodium being low. They reported that he has not been able to sit still and has been drinking lots of water. The patient was recently discharged from the behavioral health urgent care. Pt with hx dementia and schizophrenia.  Pt known to ST service from prior admission.  Cognitive linguistic evaluation completed 02/25/20 with noted severe cognitive impairment.  Pt scored 18/30 on SLUMS (with 27+ being WNL; score of 18 falls within "dementia" range).    Assessment / Plan / Recommendation  Clinical Impression  Pt presents with functional swallowing as assessed clinically.  Pt tolerated all consistencies trialed without any clinical s/s of aspiration.  Pt exhibited adequate oral clearance of regular texture solid despite edentulism.  Pt used liquid was independently.    Recommend continuing regular texture diet with thin liquids.   Pt was somewhat restless on  arrival stated he'd messed his life up and messed his head up and wanted someone to put him to sleep.  Pt was easily redirectable.  SLP provided new warm blanket and turned off the light.  Pt resting quietly at time of departure.  Full cognitive assessment to follow.  Pt seen in June by this service with severe cognitive impairment noted as assessed using SLUMS (18/30).  At that time prognosis was deemed to be etiology dependent: "If etiology of impairment is 2/2 seizures, then pt may benefit from ST at next level of care for cognitive retraining and compensatory strategies, however, if impairment is r/t ongoing schizophrenia, rehabilitation of cognition is unlikely"   SLP Visit Diagnosis: Dysphagia, unspecified (R13.10)    Aspiration Risk  No limitations    Diet Recommendation Regular;Thin liquid   Liquid Administration via: Cup;Straw Medication Administration: Whole meds with liquid Supervision: Patient able to self feed Compensations: Slow rate;Small sips/bites Postural Changes: Seated upright at 90 degrees    Other  Recommendations Oral Care Recommendations: Oral care BID    Recommendations for follow up therapy are one component of a multi-disciplinary discharge planning process, led by the attending physician.  Recommendations may be updated based on patient status, additional functional criteria and insurance authorization.  Follow up Recommendations No SLP follow up      Assistance Recommended at Discharge None  Functional Status Assessment Patient has not had a recent decline in their functional status  Frequency and Duration  (N/A)          Prognosis Prognosis for Safe Diet Advancement:  (N/A)  Swallow Study   General HPI: Jim Dunn is a 56 yo male who presented by EMS for evaluation of altered behavior, which family attributes to his sodium being low. They reported that he has not been able to sit still and has been drinking lots of water. The patient was  recently discharged from the behavioral health urgent care. Pt with hx dementia and schizophrenia.  Pt known to ST service from prior admission.  Cognitive linguistic evaluation completed 02/25/20 with noted severe cognitive impairment.  Pt scored 18/30 on SLUMS (with 27+ being WNL; score of 18 falls within "dementia" range). Type of Study: Bedside Swallow Evaluation Previous Swallow Assessment: none Diet Prior to this Study: Regular Temperature Spikes Noted: No Respiratory Status: Room air History of Recent Intubation: No Behavior/Cognition: Alert;Cooperative Oral Care Completed by SLP: No Oral Cavity - Dentition: Edentulous Patient Positioning: Upright in bed Baseline Vocal Quality: Normal Volitional Cough: Strong Volitional Swallow: Able to elicit    Oral/Motor/Sensory Function Overall Oral Motor/Sensory Function: Within functional limits Facial ROM: Within Functional Limits Facial Symmetry: Within Functional Limits Lingual ROM: Within Functional Limits (Lingual tremor noted, suspected to be medication related) Lingual Symmetry: Within Functional Limits Lingual Strength: Within Functional Limits Velum: Within Functional Limits Mandible: Within Functional Limits   Ice Chips Ice chips: Not tested   Thin Liquid Thin Liquid: Within functional limits Presentation: Cup    Nectar Thick Nectar Thick Liquid: Not tested   Honey Thick Honey Thick Liquid: Not tested   Puree Puree: Within functional limits Presentation: Spoon   Solid     Solid: Within functional limits Presentation:  (SLP fed)      Kerrie Pleasure, MA, CCC-SLP Acute Rehabilitation Services Office: [phone] 09/25/2021,4:06 PM

## 2021-09-25 NOTE — Assessment & Plan Note (Addendum)
Presented with symptomatic hyponatremia in context of polydipsia prior to admission-this is a recurrent problem Continue 1500 cc per 24-hour fluid restriction of free water only Continue sodium tablets 2 gm PO bid.  2/26 had hyponatremic related seizure with nadir all sodium 116.  Required 3% saline for correction and ICU stay.  Last sodium 130 on 3/7.  We will check weekly. Past 24-hour intake of fluids including electrolyte-based fluids is 1680 cc Continue to hold Lasix.  Restriction will focus on free water only (including coffee).  Patient is allowed electrolyte-based fluids such as Gatorade as well as juices and milk that do not count towards the fluid restriction Patient had initial CK after seizure greater than 3000 which has now normalized

## 2021-09-26 ENCOUNTER — Observation Stay (HOSPITAL_COMMUNITY): Payer: Medicare PPO

## 2021-09-26 ENCOUNTER — Observation Stay (HOSPITAL_BASED_OUTPATIENT_CLINIC_OR_DEPARTMENT_OTHER): Payer: Medicare PPO

## 2021-09-26 DIAGNOSIS — G40909 Epilepsy, unspecified, not intractable, without status epilepticus: Secondary | ICD-10-CM | POA: Diagnosis not present

## 2021-09-26 DIAGNOSIS — I5021 Acute systolic (congestive) heart failure: Secondary | ICD-10-CM | POA: Diagnosis not present

## 2021-09-26 DIAGNOSIS — R410 Disorientation, unspecified: Secondary | ICD-10-CM | POA: Diagnosis not present

## 2021-09-26 DIAGNOSIS — E871 Hypo-osmolality and hyponatremia: Secondary | ICD-10-CM | POA: Diagnosis not present

## 2021-09-26 LAB — ECHOCARDIOGRAM COMPLETE
AR max vel: 2.41 cm2
AV Area VTI: 3.11 cm2
AV Area mean vel: 2.38 cm2
AV Mean grad: 3 mmHg
AV Peak grad: 6 mmHg
Ao pk vel: 1.22 m/s
Area-P 1/2: 1.89 cm2
Calc EF: 47.7 %
S' Lateral: 3.4 cm
Single Plane A2C EF: 41.1 %
Single Plane A4C EF: 47.7 %

## 2021-09-26 LAB — RPR: RPR Ser Ql: NONREACTIVE

## 2021-09-26 LAB — SODIUM: Sodium: 128 mmol/L — ABNORMAL LOW (ref 135–145)

## 2021-09-26 NOTE — Progress Notes (Signed)
EEG complete - results pending 

## 2021-09-26 NOTE — Progress Notes (Signed)
° °  Echocardiogram 2D Echocardiogram has been performed.  Beryle Beams 09/26/2021, 8:32 AM

## 2021-09-26 NOTE — Progress Notes (Signed)
°   09/26/21 1400  Clinical Encounter Type  Visited With Patient  Visit Type Initial;Spiritual support  Referral From Nurse  Consult/Referral To None  Spiritual Encounters  Spiritual Needs Prayer;Other (Comment) (a Bible)   Chaplain responded to a page from nurse, patient was requesting a Bible.  Patient was in good spirits and pleased to be able to have a Bible to read. He asked for prayer so we spent some time praying for his request.   Valerie Roys Chaplain Resident  [facility] [phone]
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
## 2021-09-26 NOTE — ED Notes (Signed)
Breakfast order placed ?

## 2021-09-26 NOTE — Plan of Care (Signed)

## 2021-09-26 NOTE — Procedures (Signed)
Patient Name: [name]  MRN: [id number]  Epilepsy Attending: Charlsie Quest  Referring Physician/Provider: Rejeana Brock, MD Date: 09/26/2021 Duration: 22.09 mins  Patient history:  56 year old male with history of schizophrenia with a progressive declining course. EEG to evaluate for seizure  Level of alertness: Awake, asleep  AEDs during EEG study: VPA  Technical aspects: This EEG study was done with scalp electrodes positioned according to the 10-20 International system of electrode placement. Electrical activity was acquired at a sampling rate of 500Hz  and reviewed with a high frequency filter of 70Hz  and a low frequency filter of 1Hz . EEG data were recorded continuously and digitally stored.   Description: The posterior dominant rhythm consists of 8 Hz activity of moderate voltage (25-35 uV) seen predominantly in posterior head regions, symmetric and reactive to eye opening and eye closing. Sleep was characterized by vertex waves, sleep spindles (12 to 14 Hz), maximal frontocentral region. Hyperventilation and photic stimulation were not performed.     IMPRESSION: This study is within normal limits. No seizures or epileptiform discharges were seen throughout the recording.  Melessa Cowell 

## 2021-09-26 NOTE — ED Notes (Signed)
Pt sleeping soundly  not hooked up to monitor or bp or pulse ox he will not keep them on

## 2021-09-26 NOTE — ED Notes (Signed)
Dr Amada Jupiter came by to see the pt  .  The pt woke up and carried on a conversation with the doctor  then went right back to sleep after the doctor left
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
## 2021-09-26 NOTE — Plan of Care (Signed)
As per my colleague to check on MRI and EEG.  -EEG normal -MRI pending  Will follow MRI results and update recs if needed.  -- Milon Dikes, MD Neurologist [facility] Pager: [phone]

## 2021-09-27 ENCOUNTER — Ambulatory Visit: Payer: Self-pay | Admitting: Nurse Practitioner

## 2021-09-27 DIAGNOSIS — E871 Hypo-osmolality and hyponatremia: Secondary | ICD-10-CM | POA: Diagnosis not present

## 2021-09-27 NOTE — Plan of Care (Signed)
MRI completed and reviewed. IMPRESSION: No acute or reversible finding. There is a degree of generalized brain volume loss. There is a chronic right posterior parietal arachnoid cyst measuring 6 cm in diameter with chronic deformity of the right hemisphere, stable since 2021. No evidence of brain edema or gliosis.  No additional recs.Please see detailed eval in the initial  consult note.  Please call with questions.  -- Milon Dikes, MD Neurologist [facility] Pager: [phone]

## 2021-09-27 NOTE — Evaluation (Addendum)
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   Chronic post-traumatic stress disorder (PTSD)    Noncompliance with medications    Subtherapeutic serum dilantin level    Seizure (HCC)    Hypomagnesemia 02/23/2020   Suicidal ideation 02/23/2020   Paranoid schizophrenia with hallucinations    Hypokalemia    Chronic systolic CHF (congestive heart failure) (HCC) 01/22/2020   Mixed hyperlipidemia 01/22/2020   Microcytic anemia 01/22/2020   Chronic hyponatremia 01/22/2020   Seizure disorder (HCC) 01/22/2020   Past Medical History:  Past Medical History:  Diagnosis Date   CHF (congestive heart failure) (HCC)    COPD (chronic obstructive pulmonary disease) (HCC)    History of kidney stones    Hyponatremia    Paranoid schizophrenia (HCC)    Seizures (HCC)    Tobacco abuse    Past Surgical History:  Past Surgical History:  Procedure Laterality Date   KIDNEY STONE SURGERY     MULTIPLE EXTRACTIONS WITH ALVEOLOPLASTY Bilateral 04/29/2020   Procedure: Extraction of tooth #'s 2-13, 17,18, 21-25, and 27-31 with alveoloplasty and bilateral lingual exostoses reductions.;  Surgeon: [name], DDS;  Location: [facility] OR;  Service: Oral Surgery;  Laterality: Bilateral;   HPI:  Jim Dunn is a 56 yo male who presented by EMS for evaluation of altered behavior, which family attributes to his sodium being low. They reported that he has not been able to sit still and has been drinking lots of water. The patient was recently discharged from the behavioral health urgent care.  Pt with hx dementia and schizophrenia.  Pt known to ST service from prior admission.  Cognitive linguistic evaluation completed 02/25/20 with noted severe cognitive impairment.  Pt scored 18/30 on SLUMS (with 27+ being WNL; score of 18 falls within "dementia" range).   Assessment / Plan / Recommendation Clinical Impression  Pt scored 12/30 on the SLUMS, which is lower than his score during previous admission in June 2021, at which time he scored 18/30. Both scores are lower than what is considered to be within a normal range (27 or above). Per pt report and review of chart, it sounds like he was needing frequent assistance PTA. Also considering psych hx, h/o cognitive impairment, and chronic changes on MRI, current score may not be indicative of an acute change that would be appropriate for acute SLP interventions. Pt would however still benefit from frequent or constant supervision upon discharge as can be provided for safety in light of more chronic issues.    SLP Assessment  SLP Recommendation/Assessment: Patient does not need any further Speech Lanaguage Pathology Services SLP Visit Diagnosis: Cognitive communication deficit (R41.841)    Recommendations for follow up therapy are one component of a multi-disciplinary discharge planning process, led by the attending physician.  Recommendations may be updated based on patient status, additional functional criteria and insurance authorization.    Follow Up Recommendations  Follow physician's recommendations for discharge plan and follow up therapies    Assistance Recommended at Discharge  Frequent or constant Supervision/Assistance  Functional Status Assessment Patient has not had a recent decline in their functional status  Frequency  and Duration           SLP Evaluation Cognition  Overall Cognitive Status: No family/caregiver present to determine baseline cognitive functioning Arousal/Alertness: Awake/alert Orientation Level: Oriented  X4 Attention: Sustained Sustained Attention: Impaired Sustained Attention Impairment: Verbal basic Memory: Impaired Memory Impairment: Retrieval deficit;Decreased recall of new information Problem Solving: Impaired Problem Solving Impairment: Verbal basic Safety/Judgment: Impaired       Comprehension  Auditory Comprehension Overall Auditory Comprehension: Appears within functional limits for tasks assessed    Expression Expression Primary Mode of Expression: Verbal Verbal Expression Overall Verbal Expression: Appears within functional limits for tasks assessed   Oral / Motor  Motor Speech Overall Motor Speech: Appears within functional limits for tasks assessed            Mahala Menghini., M.A. CCC-SLP Acute Rehabilitation Services Office [phone]  Secure chat preferred  09/27/2021, 12:55 PM

## 2021-09-27 NOTE — Plan of Care (Signed)

## 2021-09-27 NOTE — TOC Initial Note (Signed)
Transition of Care [facility]) - Initial/Assessment Note    Patient Details  Name: Jim Dunn MRN: [id number] Date of Birth: [birth year]  Transition [facility] CM/SW Contact:    Jim Chars, LCSW Phone Number: 09/27/2021, 11:48 AM  Clinical Narrative:    Pt lives with mother, Jim Dunn.  Sister Jim Dunn also involved.  Both were contacted by CSW from the [facility] this weekend:     CSW spoke with patient's sister Jim Dunn who states she cannot care for her brother any longer at home. Jim Dunn states she and her mother are unable to control the patient and keep him safe. Jim Dunn reports the patient [facility] the [facility]. Jim Dunn states the patient wanders off and goes missing and is not safe to be alone. Jim Dunn states she made an APS report on Friday on the patient's behalf stating he is at harm to himself and others - the report was accepted. Jim Dunn states she has not received a return call from Jim Dunn as of yet. Jim Dunn reports having contacted several local agencies for assistance with placement and is aware the patient will need Medicaid for long term placement.   CSW spoke with patient's mother who states she is unable to care for the patient at home due to his behaviors and aggression.   Jim Dunn, MSW, LCSW   CSW met with pt for initial assessment.  Pt states he wants to go to a group home.  States he "does things" at home and it would be better for him to be away from his home.  Permission given to speak with sister and mother.    CSW called [facility] APS, due to reports that APS was filed and accepted.  CSW was transferred to voicemail of Jim Dunn and left message.  It was not confirmed that the APS report was accepted.    Does not appear that pt has medicaid for any sort of placement.              Expected Discharge Plan: Home/Self Care Barriers to Discharge: Other (must enter comment), Continued Medical Work up (family declining to continue to care for  pt)   Patient Goals and CMS Choice Patient states their goals for this hospitalization and ongoing recovery are:: "go to a group home"      Expected Discharge Plan and Services Expected Discharge Plan: Home/Self Care In-house Referral: Clinical Social Work   Post Acute Care Choice:  (TBD) Living arrangements for the past 2 months: Single Family Home                                      Prior Living Arrangements/Services Living arrangements for the past 2 months: Single Family Home Lives with:: Parents Patient language and need for interpreter reviewed:: Yes Do you feel safe going back to the place where you live?: Yes      Need for Family Participation in Patient Care: Yes (Comment) Care giver support system in place?: Yes (comment) Current home services: Other (comment) (none) Criminal Activity/Legal Involvement Pertinent to Current Situation/Hospitalization: No - Comment as needed  Activities of Daily Living Home Assistive Devices/Equipment: None ADL Screening (condition at time of admission) Patient's cognitive ability adequate to safely complete daily activities?: Yes Is the patient deaf or have difficulty hearing?: No Does the patient have difficulty seeing, even when wearing glasses/contacts?: No Does the patient have difficulty concentrating, remembering, or making  decisions?: No Patient able to express need for assistance with ADLs?: Yes Does the patient have difficulty dressing or bathing?: No Independently performs ADLs?: Yes (appropriate for developmental age) Does the patient have difficulty walking or climbing stairs?: No Weakness of Legs: Both Weakness of Arms/Hands: None  Permission Sought/Granted Permission sought to share information with : Family Supports Permission granted to share information with : Yes, Verbal Permission Granted  Share Information with NAME: sister Jim Dunn, mother Jim Dunn           Emotional Assessment Appearance:: Appears  stated age Attitude/Demeanor/Rapport: Engaged Affect (typically observed): Pleasant Orientation: : Oriented to Self, Oriented to Place, Oriented to  Time Alcohol / Substance Use: Not Applicable Psych Involvement: Outpatient Provider  Admission diagnosis:  Delirium [R41.0] Hyponatremia [E87.1] Schizophrenia, unspecified type ([place]) [F20.9] Patient Active Problem List   Diagnosis Date Noted   Memory loss 09/25/2021   Needs assistance with community resources 09/12/2021   Disorganized schizophrenia ([place])    Dementia ([place]) 06/08/2020   SIADH (syndrome of inappropriate ADH production) ([place]) 06/08/2020   Chronic post-traumatic stress disorder (PTSD)    Noncompliance with medications    Subtherapeutic serum dilantin level    Seizure ([place])    Hypomagnesemia 02/23/2020   Suicidal ideation 02/23/2020   Paranoid schizophrenia with hallucinations    Hypokalemia    Chronic systolic CHF (congestive heart failure) ([place]) 01/22/2020   Mixed hyperlipidemia 01/22/2020   Microcytic anemia 01/22/2020   Chronic hyponatremia 01/22/2020   Seizure disorder ([place]) 01/22/2020   PCP:  Pcp, No Pharmacy:   [facility] [address] [place] [facility] [address] [place] [place] [address] Phone: [phone] Fax: [place] [address] [place] [address] Phone: [phone] Fax: [phone]     Social Determinants of Health (SDOH) Interventions    Readmission Risk Interventions Readmission Risk Prevention Plan 01/23/2020  Transportation Screening Complete  HRI or Home Care Consult Complete  Social Work Consult for [place] Planning/Counseling Complete  Palliative Care Screening Not Applicable  Medication Review Press photographer) (No Data)

## 2021-09-27 NOTE — Progress Notes (Addendum)
PROGRESS NOTE    Jim Dunn   ION:[id number]  DOB: February 03, 1966  DOA: 09/24/2021 PCP: Oneita Hurt, No   Brief Narrative:  Jim Dunn is a 56 year old male with a diagnosis of schizophrenia per chart and per psych.  He also has a history of chronic hyponatremia, seizures, tobacco abuse, CHF and COPD. He presented to the ED on 1/21 from home as family called EMS and said they were unable to take care of him.  They stated he was drinking a lot of water & trying to burn things. The patient has been in the hospital multiple times this month as family continues to state that they are unable to care for him.  Apparantly, per notes,  he has been to group homes and has been kicked out.  Neurology and psychiatry consulted in the ED   Subjective: He has no complaints. States he wants to go to a group home.   He tells me he last got his Invega injection 4 months ago at the hospital and does not know where to go to get it as outpt.   Assessment & Plan:    History of schizophrenia -Previously diagnosed with paranoid schizophrenia at a young age - continue mirtazapine and paliperidone (Invega) 3 mg per psych consult on 1/23- there is conflicting info about when he last got his Invega injection -Neuro work up including EEG and MRI unrevealing -On my exam he is alert oriented to time place and person and states he lives at home with his mom  Hyponatremia - Chronic since 2021- ? Related to psych meds - Apparently has been drinking a lot of water at home per notes - hold Lasix - recheck sodium intermittently  Chronic systolic heart failure - 1/23 Echo > reveals an EF of 30 to 35%, global hypokinesis of LV -Does not appear to be in heart failure on my exam -Currently on Entresto and digoxin which I will continue -Hold furosemide which is ordered at 20 mg daily at home  History of seizure disorder - most recent seizure 5 yrs ago per neuro notes - On Depakote 500 mg twice daily at home which is being  continued  Disposition: in and out of ED multiple times.  APS report pending on him and family has stated they don't know how to care for him and won't take him back. TOC aware and assisting.  DVT prophylaxis: Lovenox Code Status: Full code Family Communication:  Level of Care: Level of care: Telemetry Medical Disposition Plan:  Status is: Observation- recommend inpt  Consultants:  Neurology and psychiatry Procedures:  EEG Antimicrobials:  Anti-infectives (From admission, onward)    None        Objective: Vitals:   09/26/21 1405 09/26/21 2206 09/27/21 0015 09/27/21 0418  BP: (!) 165/98 119/76 136/65 131/80  Pulse: 83 80 77 65  Resp: Temp: 98.4 F (36.9 C) 98.2 F (36.8 C)    TempSrc:  Oral    SpO2: 100% 97% 98% 100%    Intake/Output Summary (Last 24 hours) at 09/27/2021 1315 Last data filed at 09/27/2021 0900 Gross per 24 hour  Intake 240 ml  Output --  Net 240 ml    There were no vitals filed for this visit.  Examination: General exam: Appears comfortable  HEENT: PERRLA, oral mucosa moist, no sclera icterus or thrush Respiratory system: Clear to auscultation. Respiratory effort normal. Cardiovascular system: S1 & S2 heard, regular rate and rhythm Gastrointestinal system: Abdomen soft, non-tender, nondistended. Normal  bowel sounds   Central nervous system: Alert and oriented. No focal neurological deficits. Extremities: No cyanosis, clubbing or edema Skin: No rashes or ulcers Psychiatry:  Mood & affect appropriate.      Data Reviewed: I have personally reviewed following labs and imaging studies  CBC: Recent Labs  Lab 09/24/21 1609  WBC 8.2  NEUTROABS 4.4  HGB 13.4  HCT 40.5  MCV 69.1*  PLT 232    Basic Metabolic Panel: Recent Labs  Lab 09/24/21 1609 09/25/21 1253 09/25/21 2003 09/26/21 0425  NA 129* 125* 129* 128*  K 3.8 3.6  --   --   CL 95* 96*  --   --   CO2 26 25  --   --   GLUCOSE 89 137*  --   --   BUN 6 5*  --    --   CREATININE 0.73 0.57*  --   --   CALCIUM 8.8* 8.5*  --   --     GFR: CrCl cannot be calculated (Unknown ideal weight.). Liver Function Tests: Recent Labs  Lab 09/24/21 1609  AST 22  ALT 11  ALKPHOS 87  BILITOT 0.7  PROT 7.1  ALBUMIN 3.8    No results for input(s): LIPASE, AMYLASE in the last 168 hours. Recent Labs  Lab 09/25/21 2003  AMMONIA 42*    Coagulation Profile: No results for input(s): INR, PROTIME in the last 168 hours. Cardiac Enzymes: No results for input(s): CKTOTAL, CKMB, CKMBINDEX, TROPONINI in the last 168 hours. BNP (last 3 results) No results for input(s): PROBNP in the last 8760 hours. HbA1C: No results for input(s): HGBA1C in the last 72 hours. CBG: No results for input(s): GLUCAP in the last 168 hours. Lipid Profile: No results for input(s): CHOL, HDL, LDLCALC, TRIG, CHOLHDL, LDLDIRECT in the last 72 hours. Thyroid Function Tests: Recent Labs    09/25/21 2003  TSH 0.907    Anemia Panel: Recent Labs    09/25/21 2003  VITAMINB12 698    Urine analysis:    Component Value Date/Time   COLORURINE YELLOW 02/23/2020 1910   APPEARANCEUR CLEAR 02/23/2020 1910   LABSPEC 1.006 02/23/2020 1910   PHURINE 7.0 02/23/2020 1910   GLUCOSEU NEGATIVE 02/23/2020 1910   HGBUR NEGATIVE 02/23/2020 1910   BILIRUBINUR NEGATIVE 02/23/2020 1910   KETONESUR NEGATIVE 02/23/2020 1910   PROTEINUR NEGATIVE 02/23/2020 1910   NITRITE NEGATIVE 02/23/2020 1910   LEUKOCYTESUR NEGATIVE 02/23/2020 1910   Sepsis Labs: @LABRCNTIP (procalcitonin:4,lacticidven:4) ) Recent Results (from the past 240 hour(s))  Resp Panel by RT-PCR (Flu A&B, Covid) Nasopharyngeal Swab     Status: None   Collection Time: 09/24/21  4:42 PM   Specimen: Nasopharyngeal Swab; Nasopharyngeal(NP) swabs in vial transport medium  Result Value Ref Range Status   SARS Coronavirus 2 by RT PCR NEGATIVE NEGATIVE Final    Comment: (NOTE) SARS-CoV-2 target nucleic acids are NOT DETECTED.  The  SARS-CoV-2 RNA is generally detectable in upper respiratory specimens during the acute phase of infection. The lowest concentration of SARS-CoV-2 viral copies this assay can detect is 138 copies/mL. A negative result does not preclude SARS-Cov-2 infection and should not be used as the sole basis for treatment or other patient management decisions. A negative result may occur with  improper specimen collection/handling, submission of specimen other than nasopharyngeal swab, presence of viral mutation(s) within the areas targeted by this assay, and inadequate number of viral copies(<138 copies/mL). A negative result must be combined with clinical observations, patient history, and epidemiological information.  The expected result is Negative.  Fact Sheet for Patients:  BloggerCourse.com  Fact Sheet for Healthcare Providers:  SeriousBroker.it  This test is no t yet approved or cleared by the [place] FDA and  has been authorized for detection and/or diagnosis of SARS-CoV-2 by FDA under an Emergency Use Authorization (EUA). This EUA will remain  in effect (meaning this test can be used) for the duration of the COVID-19 declaration under Section 564(b)(1) of the Act, 21 U.S.C.section 360bbb-3(b)(1), unless the authorization is terminated  or revoked sooner.       Influenza A by PCR NEGATIVE NEGATIVE Final   Influenza B by PCR NEGATIVE NEGATIVE Final    Comment: (NOTE) The Xpert Xpress SARS-CoV-2/FLU/RSV plus assay is intended as an aid in the diagnosis of influenza from Nasopharyngeal swab specimens and should not be used as a sole basis for treatment. Nasal washings and aspirates are unacceptable for Xpert Xpress SARS-CoV-2/FLU/RSV testing.  Fact Sheet for Patients: BloggerCourse.com  Fact Sheet for Healthcare Providers: SeriousBroker.it  This test is not yet approved or  cleared by the [place] FDA and has been authorized for detection and/or diagnosis of SARS-CoV-2 by FDA under an Emergency Use Authorization (EUA). This EUA will remain in effect (meaning this test can be used) for the duration of the COVID-19 declaration under Section 564(b)(1) of the Act, 21 U.S.C. section 360bbb-3(b)(1), unless the authorization is terminated or revoked.  Performed at [facility], [address] [place], [place] [address]          Radiology Studies: MR BRAIN WO CONTRAST  Result Date: 09/26/2021 CLINICAL DATA:  Memory loss and confusion. Altered mental status. Schizophrenia. Hyponatremia. EXAM: MRI HEAD WITHOUT CONTRAST TECHNIQUE: Multiplanar, multiecho pulse sequences of the brain and surrounding structures were obtained without intravenous contrast. COMPARISON:  Head CT 02/23/2020 FINDINGS: Brain: The study suffers from motion degradation. No abnormality is seen affecting the brainstem or cerebellum. Cerebral hemispheres show a degree of generalized volume loss. No focal abnormality seen on the left. Chronic right posterior parietal arachnoid cyst remains visible, 6 cm in diameter, chronic deformity of the right hemisphere related to that chronic cyst. No sign of adjacent edema or gliosis. No hydrocephalus or extra-axial collection. No sign of intracranial hemorrhage. Vascular: Major vessels at the base of the brain show flow. Skull and upper cervical spine: Negative Sinuses/Orbits: Clear/normal Other: None IMPRESSION: No acute or reversible finding. There is a degree of generalized brain volume loss. There is a chronic right posterior parietal arachnoid cyst measuring 6 cm in diameter with chronic deformity of the right hemisphere, stable since 2021. No evidence of brain edema or gliosis. Electronically Signed   By: Paulina Fusi M.D.   On: 09/26/2021 18:44   EEG adult  Result Date: 09/26/2021 Charlsie Quest, MD     09/26/2021 10:33 AM Patient Name: Savaughn Karwowski MRN: [id number] Epilepsy Attending: Charlsie Quest Referring Physician/Provider: Rejeana Brock, MD Date: 09/26/2021 Duration: 22.09 mins Patient history:  56 year old male with history of schizophrenia with a progressive declining course. EEG to evaluate for seizure Level of alertness: Awake, asleep AEDs during EEG study: VPA Technical aspects: This EEG study was done with scalp electrodes positioned according to the 10-20 International system of electrode placement. Electrical activity was acquired at a sampling rate of 500Hz  and reviewed with a high frequency filter of 70Hz  and a low frequency filter of 1Hz . EEG data were recorded continuously and digitally stored. Description: The posterior dominant rhythm consists of  8 Hz activity of moderate voltage (25-35 uV) seen predominantly in posterior head regions, symmetric and reactive to eye opening and eye closing. Sleep was characterized by vertex waves, sleep spindles (12 to 14 Hz), maximal frontocentral region. Hyperventilation and photic stimulation were not performed.   IMPRESSION: This study is within normal limits. No seizures or epileptiform discharges were seen throughout the recording. Charlsie Quest   ECHOCARDIOGRAM COMPLETE  Result Date: 09/26/2021    ECHOCARDIOGRAM REPORT   Patient Name:   DENNON MOYNAHAN Date of Exam: 09/26/2021 Medical Rec #:  [id number]   Height:       68.0 in Accession #:    [id number]  Weight:       210.0 lb Date of Birth:  17-Dec-1965    BSA:          2.087 m Patient Age:    55 years    BP:           160/87 mmHg Patient Gender: M           HR:           75 bpm. Exam Location:  Inpatient Procedure: 2D Echo, Cardiac Doppler and Color Doppler Indications:    ACUTE CHF SYSTOLIC  History:        Patient has prior history of Echocardiogram examinations, most                 recent 01/22/2020. CHF; COPD. HYPONATREMIA /.  Sonographer:    Festus Barren Referring Phys: [id number] ALLISON WOLFE  Sonographer Comments: Image acquisition  challenging due to uncooperative patient. IMPRESSIONS  1. Poor endocardial definition and patient subpar cooperation. Global hypokinesis with inferior basal akinesis . Left ventricular ejection fraction, by estimation, is 30 to 35%. The left ventricle has moderately decreased function. The left ventricle demonstrates global hypokinesis. The left ventricular internal cavity size was mildly dilated. Left ventricular diastolic parameters were normal.  2. Right ventricular systolic function is normal. The right ventricular size is normal.  3. Left atrial size was moderately dilated.  4. The mitral valve is abnormal. Trivial mitral valve regurgitation. No evidence of mitral stenosis.  5. The aortic valve is tricuspid. There is mild calcification of the aortic valve. Aortic valve regurgitation is not visualized. Aortic valve sclerosis is present, with no evidence of aortic valve stenosis.  6. The inferior vena cava is normal in size with greater than 50% respiratory variability, suggesting right atrial pressure of 3 mmHg. FINDINGS  Left Ventricle: Poor endocardial definition and patient subpar cooperation. Global hypokinesis with inferior basal akinesis. Left ventricular ejection fraction, by estimation, is 30 to 35%. The left ventricle has moderately decreased function. The left ventricle demonstrates global hypokinesis. The left ventricular internal cavity size was mildly dilated. There is no left ventricular hypertrophy. Left ventricular diastolic parameters were normal. Right Ventricle: The right ventricular size is normal. No increase in right ventricular wall thickness. Right ventricular systolic function is normal. Left Atrium: Left atrial size was moderately dilated. Right Atrium: Right atrial size was normal in size. Pericardium: There is no evidence of pericardial effusion. Mitral Valve: The mitral valve is abnormal. There is moderate calcification of the mitral valve leaflet(s). Mild mitral annular  calcification. Trivial mitral valve regurgitation. No evidence of mitral valve stenosis. Tricuspid Valve: The tricuspid valve is normal in structure. Tricuspid valve regurgitation is not demonstrated. No evidence of tricuspid stenosis. Aortic Valve: The aortic valve is tricuspid. There is mild calcification of the aortic valve. Aortic valve regurgitation is  not visualized. Aortic valve sclerosis is present, with no evidence of aortic valve stenosis. Aortic valve mean gradient measures 3.0 mmHg. Aortic valve peak gradient measures 6.0 mmHg. Aortic valve area, by VTI measures 3.11 cm. Pulmonic Valve: The pulmonic valve was normal in structure. Pulmonic valve regurgitation is not visualized. No evidence of pulmonic stenosis. Aorta: The aortic root is normal in size and structure. Venous: The inferior vena cava is normal in size with greater than 50% respiratory variability, suggesting right atrial pressure of 3 mmHg. IAS/Shunts: No atrial level shunt detected by color flow Doppler.  LEFT VENTRICLE PLAX 2D LVIDd:         5.00 cm     Diastology LVIDs:         3.40 cm     LV e' medial:    4.24 cm/s LV PW:         1.10 cm     LV E/e' medial:  9.8 LV IVS:        1.10 cm     LV e' lateral:   5.44 cm/s LVOT diam:     2.30 cm     LV E/e' lateral: 7.6 LV SV:         63 LV SV Index:   30 LVOT Area:     4.15 cm  LV Volumes (MOD) LV vol d, MOD A2C: 59.3 ml LV vol d, MOD A4C: 99.0 ml LV vol s, MOD A2C: 34.9 ml LV vol s, MOD A4C: 51.8 ml LV SV MOD A2C:     24.4 ml LV SV MOD A4C:     99.0 ml LV SV MOD BP:      39.5 ml RIGHT VENTRICLE RV S prime:     10.80 cm/s RVOT diam:      2.70 cm TAPSE (M-mode): 3.3 cm LEFT ATRIUM             Index        RIGHT ATRIUM           Index LA diam:        4.80 cm 2.30 cm/m   RA Area:     13.70 cm LA Vol (A2C):   61.5 ml 29.47 ml/m  RA Volume:   29.70 ml  14.23 ml/m LA Vol (A4C):   85.2 ml 40.83 ml/m LA Biplane Vol: 72.6 ml 34.79 ml/m  AORTIC VALVE                    PULMONIC VALVE AV Area  (Vmax):    2.41 cm     PV Vmax:       0.47 m/s AV Area (Vmean):   2.38 cm     PV Vmean:      31.000 cm/s AV Area (VTI):     3.11 cm     PV VTI:        0.068 m AV Vmax:           122.00 cm/s  PV Peak grad:  0.9 mmHg AV Vmean:          80.100 cm/s  PV Mean grad:  0.0 mmHg AV VTI:            0.202 m AV Peak Grad:      6.0 mmHg AV Mean Grad:      3.0 mmHg LVOT Vmax:         70.70 cm/s LVOT Vmean:        45.800 cm/s LVOT VTI:  0.151 m LVOT/AV VTI ratio: 0.75  AORTA Ao Root diam: 3.00 cm Ao Asc diam:  3.30 cm MITRAL VALVE MV Area (PHT): 1.89 cm    SHUNTS MV Decel Time: 401 msec    Systemic VTI:  0.15 m MV E velocity: 41.60 cm/s  Systemic Diam: 2.30 cm MV A velocity: 90.80 cm/s  Pulmonic Diam: 2.70 cm MV E/A ratio:  0.46 Charlton Haws MD Electronically signed by Charlton Haws MD Signature Date/Time: 09/26/2021/9:43:18 AM    Final       Scheduled Meds:  atorvastatin  40 mg Oral Daily   digoxin  0.25 mg Oral Daily   divalproex  500 mg Oral BID   enoxaparin (LOVENOX) injection  40 mg Subcutaneous Q24H   mirtazapine  15 mg Oral QHS   nicotine  14 mg Transdermal Daily   paliperidone  3 mg Oral Daily   sacubitril-valsartan  1 tablet Oral BID   sodium chloride flush  3 mL Intravenous Q12H   thiamine  100 mg Oral Daily   Continuous Infusions:  sodium chloride       LOS: 0 days      Calvert Cantor, MD [facility] Pager: [email or web address] 09/27/2021, 1:15 PM

## 2021-09-28 DIAGNOSIS — E871 Hypo-osmolality and hyponatremia: Secondary | ICD-10-CM | POA: Diagnosis not present

## 2021-09-28 LAB — BASIC METABOLIC PANEL
Anion gap: 6 (ref 5–15)
BUN: 10 mg/dL (ref 6–20)
CO2: 26 mmol/L (ref 22–32)
Calcium: 8.9 mg/dL (ref 8.9–10.3)
Chloride: 93 mmol/L — ABNORMAL LOW (ref 98–111)
Creatinine, Ser: 0.63 mg/dL (ref 0.61–1.24)
GFR, Estimated: >60 mL/min (ref >60)
Glucose, Bld: 88 mg/dL (ref 70–99)
Potassium: 4 mmol/L (ref 3.5–5.1)
Sodium: 125 mmol/L — ABNORMAL LOW (ref 135–145)

## 2021-09-28 LAB — SODIUM
Sodium: 127 mmol/L — ABNORMAL LOW (ref 135–145)
Sodium: 125 mmol/L — ABNORMAL LOW (ref 135–145)
Sodium: 126 mmol/L — ABNORMAL LOW (ref 135–145)

## 2021-09-28 MED ORDER — MELATONIN 3 MG PO TABS
3.0000 mg | ORAL_TABLET | Freq: Every day | ORAL | Status: DC
  Administered 2021-09-28 – 2022-04-06 (×191): 3 mg via ORAL
  Filled 2021-09-28 (×191): qty 1

## 2021-09-28 MED ORDER — SODIUM CHLORIDE 1 G PO TABS
1.0000 g | ORAL_TABLET | Freq: Three times a day (TID) | ORAL | Status: DC
  Administered 2021-09-28 (×3): 1 g via ORAL
  Filled 2021-09-28 (×5): qty 1

## 2021-09-28 NOTE — Progress Notes (Signed)
Mobility Specialist Progress Note   09/28/21 1230  Mobility  Activity Ambulated independently in hallway  Level of Assistance Independent after set-up  Assistive Device None  Distance Ambulated (ft) 550 ft  Activity Response Tolerated well  $Mobility charge 1 Mobility   Received pt in doorway having no complaints and agreeable to mobility. Asymptomatic throughout ambulation, returned back to bed w/ call bell in reach and all needs met.  Holland Falling Mobility Specialist Phone Number [phone]

## 2021-09-28 NOTE — Evaluation (Signed)
Physical Therapy Evaluation Patient Details Name: Jim Dunn MRN: [id number] DOB: October 16, 1965 Today's Date: 09/28/2021  History of Present Illness  56 y.o. male presents to [facility] hospital on  09/24/2021 with diagnosis of schizophrenia as well as hyponatremia. Pt's family reported the pt was drinking excessive amounts of water and attempting to burn things. PMH includes hyponatremia, seizures, tobacco abuse, CHF and COPD.  Clinical Impression  Pt presents to PT mobilizing independently. Pt is able to ambulate for community distances without loss of balance, dons shoes in single leg stance, and demonstrates no considerable mobility deficits. Pt has no further acute PT needs. Acute PT signing off.       Recommendations for follow up therapy are one component of a multi-disciplinary discharge planning process, led by the attending physician.  Recommendations may be updated based on patient status, additional functional criteria and insurance authorization.  Follow Up Recommendations No PT follow up    Assistance Recommended at Discharge Intermittent Supervision/Assistance  Patient can return home with the following  Direct supervision/assist for financial management;Direct supervision/assist for medications management;Assist for transportation    Equipment Recommendations None recommended by PT  Recommendations for Other Services       Functional Status Assessment Patient has not had a recent decline in their functional status     Precautions / Restrictions Precautions Precautions: None Restrictions Weight Bearing Restrictions: No      Mobility  Bed Mobility Overal bed mobility: Independent                  Transfers Overall transfer level: Independent                      Ambulation/Gait Ambulation/Gait assistance: Independent Gait Distance (Feet): 1000 Feet Assistive device: None Gait Pattern/deviations: WFL(Within Functional Limits) Gait velocity:  functional Gait velocity interpretation: >2.62 ft/sec, indicative of community ambulatory   General Gait Details: pt with steady step-through gait  Stairs            Wheelchair Mobility    Modified Rankin (Stroke Patients Only)       Balance Overall balance assessment: Independent                                           Pertinent Vitals/Pain Pain Assessment Pain Assessment: No/denies pain    Home Living Family/patient expects to be discharged to:: Private residence Living Arrangements: Parent Available Help at Discharge: Family Type of Home: House Home Access: Level entry     Alternate Level Stairs-Number of Steps: 13 Home Layout: Two level Home Equipment: Cane - single point      Prior Function Prior Level of Function : Independent/Modified Independent                     Hand Dominance   Dominant Hand: Left    Extremity/Trunk Assessment   Upper Extremity Assessment Upper Extremity Assessment: Overall WFL for tasks assessed    Lower Extremity Assessment Lower Extremity Assessment: Overall WFL for tasks assessed    Cervical / Trunk Assessment Cervical / Trunk Assessment: Normal  Communication   Communication: No difficulties  Cognition Arousal/Alertness: Awake/alert Behavior During Therapy: Impulsive Overall Cognitive Status: No family/caregiver present to determine baseline cognitive functioning  General Comments: pt follows commands well, is oriented to person/place/situation.        General Comments General comments (skin integrity, edema, etc.): VSS on RA    Exercises     Assessment/Plan    PT Assessment Patient does not need any further PT services  PT Problem List         PT Treatment Interventions      PT Goals (Current goals can be found in the Care Plan section)       Frequency       Co-evaluation               AM-PAC PT "6 Clicks"  Mobility  Outcome Measure Help needed turning from your back to your side while in a flat bed without using bedrails?: None Help needed moving from lying on your back to sitting on the side of a flat bed without using bedrails?: None Help needed moving to and from a bed to a chair (including a wheelchair)?: None Help needed standing up from a chair using your arms (e.g., wheelchair or bedside chair)?: None Help needed to walk in hospital room?: None Help needed climbing 3-5 steps with a railing? : None 6 Click Score: 24    End of Session   Activity Tolerance: Patient tolerated treatment well Patient left: in bed;with call bell/phone within reach Nurse Communication: Mobility status PT Visit Diagnosis: Difficulty in walking, not elsewhere classified (R26.2)    Time: 4270-6237 PT Time Calculation (min) (ACUTE ONLY): 10 min   Charges:   PT Evaluation $PT Eval Low Complexity: 1 Low          Arlyss Gandy, PT, DPT Acute Rehabilitation Pager: [phone] Office [phone]   Arlyss Gandy 09/28/2021, 5:18 PM

## 2021-09-28 NOTE — Progress Notes (Signed)
PROGRESS NOTE    Jim Dunn   KDX:[id number]  DOB: [birth year]  DOA: 09/24/2021 PCP: Oneita Hurt, No   Brief Narrative:  Jim Dunn is a 56 year old male with a diagnosis of schizophrenia per chart and per psych.  He also has a history of chronic hyponatremia, seizures, tobacco abuse, CHF and COPD. He presented to the ED on 1/21 from home as family called EMS and said they were unable to take care of him.  They stated he was drinking a lot of water & trying to burn things. The patient has been in the hospital multiple times this month as family continues to state that they are unable to care for him.  Apparantly, per notes,  he has been to group homes and has been kicked out.  Neurology and psychiatry consulted in the ED  09/28/2021: Patient was seen and examined at bedside.  Denies having any headaches or dizziness.  No nausea time of this visit.  Serum sodium downtrending, 125 from 128.  Started on salt tablet 1 g 3 times daily.    Assessment & Plan:    History of schizophrenia -Previously diagnosed with paranoid schizophrenia at a young age -Psych is recommending a one-to-one sitter for suicidal ideation- he does not have any suicidal thoughts on my exam - continue mirtazapine and paliperidone 3 mg -Neuro has ordered further work-up-an EEG has been completed today and is within normal limits -On my exam he is alert oriented to time place and person and states he lives at home with his mom  Worsening hyponatremia - Chronic since 2021- ? Related to psych meds - Apparently has been drinking a lot of water at home-we will follow in the hospital and limit his water intake to 1500 cc- hold Lasix for now. Started salt tablet 1 g 3 times daily on 09/28/2021. Serum sodium every 6 hours. Goal serum sodium 130 or above.  Chronic systolic heart failure - Echo performed today reveals an EF of 30 to 35%, global hypokinesis of LV -Does not appear to be in heart failure on my exam -Currently on  Entresto and digoxin which I will continue -Hold furosemide which is ordered at 20 mg daily at home, resume home p.o. Lasix once the serum sodium is stable.  History of seizure disorder - On Depakote 500 mg twice daily at home which is being continued Seizure precautions   DVT prophylaxis: Lovenox subcu daily Code Status: Full code Family Communication: None at bedside. Level of Care: Level of care: Telemetry Medical Disposition Plan:  Status is: Observation- recommend inpt  Consultants:  Neurology and psychiatry Procedures:  EEG Antimicrobials:  Anti-infectives (From admission, onward)    None        Objective: Vitals:   09/27/21 1300 09/27/21 2126 09/28/21 0512 09/28/21 0959  BP: 130/77 (!) 163/83 (!) 128/94 140/85  Pulse: 66 83 67 81  Resp: 18 16 16 14   Temp: 98.3 F (36.8 C) 99.1 F (37.3 C) 98.1 F (36.7 C) 99 F (37.2 C)  TempSrc: Oral Oral Oral Oral  SpO2: 99% 99% 95% 99%    Intake/Output Summary (Last 24 hours) at 09/28/2021 1007 Last data filed at 09/27/2021 1700 Gross per 24 hour  Intake 360 ml  Output --  Net 360 ml   There were no vitals filed for this visit.  Examination: General exam: Well-developed well-nourished in no acute distress.  He is alert and interactive. HEENT: Oral mucosa is moist.  Head is atraumatic, normocephalic. Respiratory system: Clear to auscultation  with no wheezes or rales.  Poor inspiratory effort.   Cardiovascular system: Regular rate and rhythm no rubs or gallops.  No JVD aortomegaly noted. Gastrointestinal system: Soft nontender normal bowel sounds present. Central nervous system: Alert and interactive.  Nonfocal exam.   Extremities: No lower extremity edema bilaterally. Psychiatry: Mood is appropriate for condition and setting.    Data Reviewed: I have personally reviewed following labs and imaging studies  CBC: Recent Labs  Lab 09/24/21 1609  WBC 8.2  NEUTROABS 4.4  HGB 13.4  HCT 40.5  MCV 69.1*  PLT  232   Basic Metabolic Panel: Recent Labs  Lab 09/24/21 1609 09/25/21 1253 09/25/21 2003 09/26/21 0425 09/28/21 0307 09/28/21 0900  NA 129* 125* 129* 128* 125* 127*  K 3.8 3.6  --   --  4.0  --   CL 95* 96*  --   --  93*  --   CO2 26 25  --   --  26  --   GLUCOSE 89 137*  --   --  88  --   BUN 6 5*  --   --  10  --   CREATININE 0.73 0.57*  --   --  0.63  --   CALCIUM 8.8* 8.5*  --   --  8.9  --    GFR: CrCl cannot be calculated (Unknown ideal weight.). Liver Function Tests: Recent Labs  Lab 09/24/21 1609  AST 22  ALT 11  ALKPHOS 87  BILITOT 0.7  PROT 7.1  ALBUMIN 3.8   No results for input(s): LIPASE, AMYLASE in the last 168 hours. Recent Labs  Lab 09/25/21 2003  AMMONIA 42*   Coagulation Profile: No results for input(s): INR, PROTIME in the last 168 hours. Cardiac Enzymes: No results for input(s): CKTOTAL, CKMB, CKMBINDEX, TROPONINI in the last 168 hours. BNP (last 3 results) No results for input(s): PROBNP in the last 8760 hours. HbA1C: No results for input(s): HGBA1C in the last 72 hours. CBG: No results for input(s): GLUCAP in the last 168 hours. Lipid Profile: No results for input(s): CHOL, HDL, LDLCALC, TRIG, CHOLHDL, LDLDIRECT in the last 72 hours. Thyroid Function Tests: Recent Labs    09/25/21 2003  TSH 0.907   Anemia Panel: Recent Labs    09/25/21 2003  VITAMINB12 698   Urine analysis:    Component Value Date/Time   COLORURINE YELLOW 02/23/2020 1910   APPEARANCEUR CLEAR 02/23/2020 1910   LABSPEC 1.006 02/23/2020 1910   PHURINE 7.0 02/23/2020 1910   GLUCOSEU NEGATIVE 02/23/2020 1910   HGBUR NEGATIVE 02/23/2020 1910   BILIRUBINUR NEGATIVE 02/23/2020 1910   KETONESUR NEGATIVE 02/23/2020 1910   PROTEINUR NEGATIVE 02/23/2020 1910   NITRITE NEGATIVE 02/23/2020 1910   LEUKOCYTESUR NEGATIVE 02/23/2020 1910   Sepsis Labs: @LABRCNTIP (procalcitonin:4,lacticidven:4) ) Recent Results (from the past 240 hour(s))  Resp Panel by RT-PCR (Flu  A&B, Covid) Nasopharyngeal Swab     Status: None   Collection Time: 09/24/21  4:42 PM   Specimen: Nasopharyngeal Swab; Nasopharyngeal(NP) swabs in vial transport medium  Result Value Ref Range Status   SARS Coronavirus 2 by RT PCR NEGATIVE NEGATIVE Final    Comment: (NOTE) SARS-CoV-2 target nucleic acids are NOT DETECTED.  The SARS-CoV-2 RNA is generally detectable in upper respiratory specimens during the acute phase of infection. The lowest concentration of SARS-CoV-2 viral copies this assay can detect is 138 copies/mL. A negative result does not preclude SARS-Cov-2 infection and should not be used as the sole basis for treatment  or other patient management decisions. A negative result may occur with  improper specimen collection/handling, submission of specimen other than nasopharyngeal swab, presence of viral mutation(s) within the areas targeted by this assay, and inadequate number of viral copies(<138 copies/mL). A negative result must be combined with clinical observations, patient history, and epidemiological information. The expected result is Negative.  Fact Sheet for Patients:  BloggerCourse.com  Fact Sheet for Healthcare Providers:  SeriousBroker.it  This test is no t yet approved or cleared by the [place] FDA and  has been authorized for detection and/or diagnosis of SARS-CoV-2 by FDA under an Emergency Use Authorization (EUA). This EUA will remain  in effect (meaning this test can be used) for the duration of the COVID-19 declaration under Section 564(b)(1) of the Act, 21 U.S.C.section 360bbb-3(b)(1), unless the authorization is terminated  or revoked sooner.       Influenza A by PCR NEGATIVE NEGATIVE Final   Influenza B by PCR NEGATIVE NEGATIVE Final    Comment: (NOTE) The Xpert Xpress SARS-CoV-2/FLU/RSV plus assay is intended as an aid in the diagnosis of influenza from Nasopharyngeal swab specimens  and should not be used as a sole basis for treatment. Nasal washings and aspirates are unacceptable for Xpert Xpress SARS-CoV-2/FLU/RSV testing.  Fact Sheet for Patients: BloggerCourse.com  Fact Sheet for Healthcare Providers: SeriousBroker.it  This test is not yet approved or cleared by the [place] FDA and has been authorized for detection and/or diagnosis of SARS-CoV-2 by FDA under an Emergency Use Authorization (EUA). This EUA will remain in effect (meaning this test can be used) for the duration of the COVID-19 declaration under Section 564(b)(1) of the Act, 21 U.S.C. section 360bbb-3(b)(1), unless the authorization is terminated or revoked.  Performed at [facility], [address], [place], [place] [address]          Radiology Studies: MR BRAIN WO CONTRAST  Result Date: 09/26/2021 CLINICAL DATA:  Memory loss and confusion. Altered mental status. Schizophrenia. Hyponatremia. EXAM: MRI HEAD WITHOUT CONTRAST TECHNIQUE: Multiplanar, multiecho pulse sequences of the brain and surrounding structures were obtained without intravenous contrast. COMPARISON:  Head CT 02/23/2020 FINDINGS: Brain: The study suffers from motion degradation. No abnormality is seen affecting the brainstem or cerebellum. Cerebral hemispheres show a degree of generalized volume loss. No focal abnormality seen on the left. Chronic right posterior parietal arachnoid cyst remains visible, 6 cm in diameter, chronic deformity of the right hemisphere related to that chronic cyst. No sign of adjacent edema or gliosis. No hydrocephalus or extra-axial collection. No sign of intracranial hemorrhage. Vascular: Major vessels at the base of the brain show flow. Skull and upper cervical spine: Negative Sinuses/Orbits: Clear/normal Other: None IMPRESSION: No acute or reversible finding. There is a degree of generalized brain volume loss. There is a chronic right  posterior parietal arachnoid cyst measuring 6 cm in diameter with chronic deformity of the right hemisphere, stable since 2021. No evidence of brain edema or gliosis. Electronically Signed   By: Paulina Fusi M.D.   On: 09/26/2021 18:44   EEG adult  Result Date: 09/26/2021 Charlsie Quest, MD     09/26/2021 10:33 AM Patient Name: Jammey Routledge MRN: [id number] Epilepsy Attending: Charlsie Quest Referring Physician/Provider: Rejeana Brock, MD Date: 09/26/2021 Duration: 22.09 mins Patient history:  56 year old male with history of schizophrenia with a progressive declining course. EEG to evaluate for seizure Level of alertness: Awake, asleep AEDs during EEG study: VPA Technical aspects: This EEG study was done  with scalp electrodes positioned according to the 10-20 International system of electrode placement. Electrical activity was acquired at a sampling rate of  and reviewed with a high frequency filter of  and a low frequency filter of . EEG data were recorded continuously and digitally stored. Description: The posterior dominant rhythm consists of 8 Hz activity of moderate voltage (25-35 uV) seen predominantly in posterior head regions, symmetric and reactive to eye opening and eye closing. Sleep was characterized by vertex waves, sleep spindles (12 to 14 Hz), maximal frontocentral region. Hyperventilation and photic stimulation were not performed.   IMPRESSION: This study is within normal limits. No seizures or epileptiform discharges were seen throughout the recording. Priyanka Annabelle Harman      Scheduled Meds:  atorvastatin  40 mg Oral Daily   digoxin  0.25 mg Oral Daily   divalproex  500 mg Oral BID   enoxaparin (LOVENOX) injection  40 mg Subcutaneous Q24H   mirtazapine  15 mg Oral QHS   nicotine  14 mg Transdermal Daily   paliperidone  3 mg Oral Daily   sacubitril-valsartan  1 tablet Oral BID   sodium chloride flush  3 mL Intravenous Q12H   sodium chloride  1 g Oral TID WC    Continuous Infusions:  sodium chloride       LOS: 0 days      Darlin Drop, MD [facility] Pager: [email or web address] 09/28/2021, 10:07 AM

## 2021-09-28 NOTE — Plan of Care (Signed)
Patient Jim Dunn. Forgetful at times. Walked the halls today. VSS. Denies pain. Call bell within reach. Frequent rounding on patient.   Sister came to bedside this evening. Will bring extra clothes for him to have. Insisted patient be bathed everyday and he will need assist. RN informed sister the patient will be bathed everyday and gave an update on how the patient has been today. Sister very emotional during conversation, feeling guilty for not being able to care for her brother. RN gave emotional support and encouraged her to visit so patient can have a familiar face to talk to some days.   Problem: Health Behavior/Discharge Planning: Goal: Ability to manage health-related needs will improve Outcome: Progressing   Problem: Clinical Measurements: Goal: Ability to maintain clinical measurements within normal limits will improve Outcome: Progressing

## 2021-09-28 NOTE — TOC Progression Note (Signed)
Transition of Care [name]) - Progression Note    Patient Details  Name: Jim Dunn MRN: [id number] Date of Birth: 1966/06/22  Transition of [facility]) CM/SW Contact  Joanne Chars, LCSW Phone Number: 09/28/2021, 9:49 AM  Clinical Narrative:   CSW received call from Chauncy Lean, [facility] APS.  [phone].  She reports they have accepted this report, they have spoken to pt and to family.  They are looking a potential guardianship and it does not appear that there is an available family member to assume this responsibility.  Marsh Dolly has not received word from her leadership as to the plan as of yet but will stay in touch.  CSW contacted Saprese Jones/Financial Counseling and requested pt be screened for medicaid.      Expected Discharge Plan: Home/Self Care Barriers to Discharge: Other (must enter comment), Continued Medical Work up (family declining to continue to care for pt)  Expected Discharge Plan and Services Expected Discharge Plan: Home/Self Care In-house Referral: Clinical Social Work   Post Acute Care Choice:  (TBD) Living arrangements for the past 2 months: Single Family Home                                       Social Determinants of Health (SDOH) Interventions    Readmission Risk Interventions Readmission Risk Prevention Plan 01/23/2020  Transportation Screening Complete  HRI or Home Care Consult Complete  Social Work Consult for [place] Planning/Counseling [place] Not Applicable  Medication Review Press photographer) (No Data)

## 2021-09-29 DIAGNOSIS — M6282 Rhabdomyolysis: Secondary | ICD-10-CM | POA: Diagnosis not present

## 2021-09-29 DIAGNOSIS — F631 Pyromania: Secondary | ICD-10-CM | POA: Diagnosis present

## 2021-09-29 DIAGNOSIS — N179 Acute kidney failure, unspecified: Secondary | ICD-10-CM | POA: Diagnosis not present

## 2021-09-29 DIAGNOSIS — R631 Polydipsia: Secondary | ICD-10-CM | POA: Diagnosis present

## 2021-09-29 DIAGNOSIS — E872 Acidosis, unspecified: Secondary | ICD-10-CM | POA: Diagnosis not present

## 2021-09-29 DIAGNOSIS — F0390 Unspecified dementia without behavioral disturbance: Secondary | ICD-10-CM | POA: Diagnosis not present

## 2021-09-29 DIAGNOSIS — F200 Paranoid schizophrenia: Secondary | ICD-10-CM | POA: Diagnosis present

## 2021-09-29 DIAGNOSIS — F03B18 Unspecified dementia, moderate, with other behavioral disturbance: Secondary | ICD-10-CM | POA: Diagnosis present

## 2021-09-29 DIAGNOSIS — E782 Mixed hyperlipidemia: Secondary | ICD-10-CM | POA: Diagnosis present

## 2021-09-29 DIAGNOSIS — Z23 Encounter for immunization: Secondary | ICD-10-CM | POA: Diagnosis not present

## 2021-09-29 DIAGNOSIS — J449 Chronic obstructive pulmonary disease, unspecified: Secondary | ICD-10-CM | POA: Diagnosis present

## 2021-09-29 DIAGNOSIS — Z9183 Wandering in diseases classified elsewhere: Secondary | ICD-10-CM | POA: Diagnosis not present

## 2021-09-29 DIAGNOSIS — F209 Schizophrenia, unspecified: Secondary | ICD-10-CM | POA: Diagnosis not present

## 2021-09-29 DIAGNOSIS — G40409 Other generalized epilepsy and epileptic syndromes, not intractable, without status epilepticus: Secondary | ICD-10-CM | POA: Diagnosis present

## 2021-09-29 DIAGNOSIS — F4310 Post-traumatic stress disorder, unspecified: Secondary | ICD-10-CM | POA: Diagnosis present

## 2021-09-29 DIAGNOSIS — F17210 Nicotine dependence, cigarettes, uncomplicated: Secondary | ICD-10-CM | POA: Diagnosis present

## 2021-09-29 DIAGNOSIS — K5900 Constipation, unspecified: Secondary | ICD-10-CM | POA: Diagnosis not present

## 2021-09-29 DIAGNOSIS — E222 Syndrome of inappropriate secretion of antidiuretic hormone: Secondary | ICD-10-CM | POA: Diagnosis present

## 2021-09-29 DIAGNOSIS — G40909 Epilepsy, unspecified, not intractable, without status epilepticus: Secondary | ICD-10-CM | POA: Diagnosis not present

## 2021-09-29 DIAGNOSIS — E669 Obesity, unspecified: Secondary | ICD-10-CM | POA: Diagnosis present

## 2021-09-29 DIAGNOSIS — R4182 Altered mental status, unspecified: Secondary | ICD-10-CM | POA: Diagnosis present

## 2021-09-29 DIAGNOSIS — U071 COVID-19: Secondary | ICD-10-CM | POA: Diagnosis not present

## 2021-09-29 DIAGNOSIS — J431 Panlobular emphysema: Secondary | ICD-10-CM | POA: Diagnosis not present

## 2021-09-29 DIAGNOSIS — I959 Hypotension, unspecified: Secondary | ICD-10-CM | POA: Diagnosis not present

## 2021-09-29 DIAGNOSIS — T43505A Adverse effect of unspecified antipsychotics and neuroleptics, initial encounter: Secondary | ICD-10-CM | POA: Diagnosis present

## 2021-09-29 DIAGNOSIS — E871 Hypo-osmolality and hyponatremia: Secondary | ICD-10-CM | POA: Diagnosis not present

## 2021-09-29 DIAGNOSIS — I5022 Chronic systolic (congestive) heart failure: Secondary | ICD-10-CM | POA: Diagnosis present

## 2021-09-29 DIAGNOSIS — I110 Hypertensive heart disease with heart failure: Secondary | ICD-10-CM | POA: Diagnosis present

## 2021-09-29 DIAGNOSIS — Z6841 Body Mass Index (BMI) 40.0 and over, adult: Secondary | ICD-10-CM | POA: Diagnosis not present

## 2021-09-29 DIAGNOSIS — Z20822 Contact with and (suspected) exposure to covid-19: Secondary | ICD-10-CM | POA: Diagnosis present

## 2021-09-29 LAB — SODIUM
Sodium: 126 mmol/L — ABNORMAL LOW (ref 135–145)
Sodium: 127 mmol/L — ABNORMAL LOW (ref 135–145)
Sodium: 128 mmol/L — ABNORMAL LOW (ref 135–145)
Sodium: 129 mmol/L — ABNORMAL LOW (ref 135–145)

## 2021-09-29 LAB — BRAIN NATRIURETIC PEPTIDE: B Natriuretic Peptide: 10.3 pg/mL (ref 0.0–100.0)

## 2021-09-29 MED ORDER — SODIUM CHLORIDE 1 G PO TABS
2.0000 g | ORAL_TABLET | Freq: Three times a day (TID) | ORAL | Status: DC
  Administered 2021-09-29 – 2021-10-06 (×22): 2 g via ORAL
  Filled 2021-09-29 (×23): qty 2

## 2021-09-29 MED ORDER — HYDROXYZINE HCL 10 MG PO TABS
10.0000 mg | ORAL_TABLET | Freq: Three times a day (TID) | ORAL | Status: DC | PRN
  Administered 2021-09-29 – 2021-10-25 (×40): 10 mg via ORAL
  Filled 2021-09-29 (×51): qty 1

## 2021-09-29 NOTE — Plan of Care (Signed)
  Problem: Education: Goal: Knowledge of General Education information will improve Description Including pain rating scale, medication(s)/side effects and non-pharmacologic comfort measures Outcome: Progressing   Problem: Health Behavior/Discharge Planning: Goal: Ability to manage health-related needs will improve Outcome: Progressing   

## 2021-09-29 NOTE — Progress Notes (Signed)
PROGRESS NOTE    [name]   [id number]  DOB: 1966/08/25  DOA: 09/24/2021 PCP: Merryl Hacker, No   Brief Narrative:  Jim Dunn is a 56 year old male with a diagnosis of schizophrenia per chart and per psych.  He also has a history of chronic hyponatremia, seizure disorder, tobacco abuse, HFrEF 30 to 35% and COPD. He presented to the ED on 09/24/21 from home as family called EMS and said they were unable to take care of him.  They stated he was drinking a lot of water & trying to burn things.  The patient has been in the hospital multiple times this month as family continues to state that they are unable to care for him.  Apparantly, per notes,  he has been to group homes and has been kicked out.  Neurology and psychiatry were consulted in the ED to assist with the management of his underlying psych illness and seizure disorder.  Work-up revealed acute on chronic hypervolemic hyponatremia.  Patient was started on salt tablet on 09/28/2021.  Serum sodium uptrending 127 from 125 previously.  09/29/2021: Patient was seen and examined at his bedside.  He denies headache dizziness or nausea.    Assessment & Plan:    History of schizophrenia -Previously diagnosed with paranoid schizophrenia at a young age -Psych is recommending a one-to-one sitter for suicidal ideation- he does not have any suicidal thoughts on my exam. - continue mirtazapine and paliperidone 3 mg -Neuro has ordered further work-up-an EEG has been completed and is within normal limits -On my exam he is alert oriented to time place and person and states he lives at home with his mom. Appears stable.  Hypervolemic hyponatremia, acute on chronic. Suspect contributed by psych medications. - Apparently has been drinking a lot of water at home- Continue to limit water intake to less than 1500 cc/day  Goal serum sodium 130 or above  HFrEF 30 to 35% Euvolemic on exam 2D echo from 09/26/2021 reveals an EF of 30 to 35%, global  hypokinesis of LV Continue home heart failure medications. Strict I's and O's and daily weight Net I&O +1.6 L  Seizure disorder Continue home AEDs Home Depakote 500 mg twice daily Continue seizure precautions   DVT prophylaxis: Lovenox subcu daily Code Status: Full code Family Communication: None at bedside. Level of Care: Level of care: Telemetry Medical Disposition Plan:  Status is: Inpatient   Patient requires at least 2 midnights for further evaluation and treatment of present condition.  Consultants:  Neurology and psychiatry Procedures:  EEG Antimicrobials:  Anti-infectives (From admission, onward)    None        Objective: Vitals:   09/28/21 2121 09/29/21 0507 09/29/21 0728 09/29/21 1217  BP: (!) 164/84 (!) 142/87 (!) 148/90 (!) 160/95  Pulse: 86 77 96 78  Resp: 16 14 18 18   Temp: 98.9 F (37.2 C) 97.9 F (36.6 C) 98 F (36.7 C) 98 F (36.7 C)  TempSrc: Oral Oral    SpO2: 96% 100%  100%   No intake or output data in the 24 hours ending 09/29/21 1403  There were no vitals filed for this visit.  Examination: General exam: Well-developed well-nourished no acute distress.  He is alert and interactive.   HEENT: Atraumatic.  Normocephalic.   Respiratory system: Clear to auscultation no wheezes or rales.  Cardiovascular system: Regular rate and rhythm no rubs or gallops.   Gastrointestinal system: Soft monitor normal bowel sounds present. Central nervous system: Alert and interactive.  Extremities: No lower extremity edema bilaterally Psychiatry: Mood is appropriate for condition and setting.    Data Reviewed: I have personally reviewed following labs and imaging studies  CBC: Recent Labs  Lab 09/24/21 1609  WBC 8.2  NEUTROABS 4.4  HGB 13.4  HCT 40.5  MCV 69.1*  PLT [id number]   Basic Metabolic Panel: Recent Labs  Lab 09/24/21 1609 09/25/21 1253 09/25/21 2003 09/28/21 0307 09/28/21 0900 09/28/21 1434 09/28/21 2040 09/29/21 0255  09/29/21 0658  NA 129* 125*   < > 125* 127* 125* 126* 126* 127*  K 3.8 3.6  --  4.0  --   --   --   --   --   CL 95* 96*  --  93*  --   --   --   --   --   CO2 26 25  --  26  --   --   --   --   --   GLUCOSE 89 137*  --  88  --   --   --   --   --   BUN 6 5*  --  10  --   --   --   --   --   CREATININE 0.73 0.57*  --  0.63  --   --   --   --   --   CALCIUM 8.8* 8.5*  --  8.9  --   --   --   --   --    < > = values in this interval not displayed.   GFR: CrCl cannot be calculated (Unknown ideal weight.). Liver Function Tests: Recent Labs  Lab 09/24/21 1609  AST 22  ALT 11  ALKPHOS 87  BILITOT 0.7  PROT 7.1  ALBUMIN 3.8   No results for input(s): LIPASE, AMYLASE in the last 168 hours. Recent Labs  Lab 09/25/21 2003  AMMONIA 42*   Coagulation Profile: No results for input(s): INR, PROTIME in the last 168 hours. Cardiac Enzymes: No results for input(s): CKTOTAL, CKMB, CKMBINDEX, TROPONINI in the last 168 hours. BNP (last 3 results) No results for input(s): PROBNP in the last 8760 hours. HbA1C: No results for input(s): HGBA1C in the last 72 hours. CBG: No results for input(s): GLUCAP in the last 168 hours. Lipid Profile: No results for input(s): CHOL, HDL, LDLCALC, TRIG, CHOLHDL, LDLDIRECT in the last 72 hours. Thyroid Function Tests: No results for input(s): TSH, T4TOTAL, FREET4, T3FREE, THYROIDAB in the last 72 hours.  Anemia Panel: No results for input(s): VITAMINB12, FOLATE, FERRITIN, TIBC, IRON, RETICCTPCT in the last 72 hours.  Urine analysis:    Component Value Date/Time   COLORURINE YELLOW 02/23/2020 1910   APPEARANCEUR CLEAR 02/23/2020 1910   LABSPEC 1.006 02/23/2020 1910   PHURINE 7.0 02/23/2020 1910   GLUCOSEU NEGATIVE 02/23/2020 1910   HGBUR NEGATIVE 02/23/2020 1910   BILIRUBINUR NEGATIVE 02/23/2020 1910   KETONESUR NEGATIVE 02/23/2020 1910   PROTEINUR NEGATIVE 02/23/2020 1910   NITRITE NEGATIVE 02/23/2020 1910   LEUKOCYTESUR NEGATIVE 02/23/2020  1910   Sepsis Labs: @LABRCNTIP (procalcitonin:4,lacticidven:4) ) Recent Results (from the past 240 hour(s))  Resp Panel by RT-PCR (Flu A&B, Covid) Nasopharyngeal Swab     Status: None   Collection Time: 09/24/21  4:42 PM   Specimen: Nasopharyngeal Swab; Nasopharyngeal(NP) swabs in vial transport medium  Result Value Ref Range Status   SARS Coronavirus 2 by RT PCR NEGATIVE NEGATIVE Final    Comment: (NOTE) SARS-CoV-2 target nucleic acids are NOT DETECTED.  The  SARS-CoV-2 RNA is generally detectable in upper respiratory specimens during the acute phase of infection. The lowest concentration of SARS-CoV-2 viral copies this assay can detect is 138 copies/mL. A negative result does not preclude SARS-Cov-2 infection and should not be used as the sole basis for treatment or other patient management decisions. A negative result may occur with  improper specimen collection/handling, submission of specimen other than nasopharyngeal swab, presence of viral mutation(s) within the areas targeted by this assay, and inadequate number of viral copies(<138 copies/mL). A negative result must be combined with clinical observations, patient history, and epidemiological information. The expected result is Negative.  Fact Sheet for Patients:  EntrepreneurPulse.com.au  Fact Sheet for Healthcare Providers:  IncredibleEmployment.be  This test is no t yet approved or cleared by the [place] FDA and  has been authorized for detection and/or diagnosis of SARS-CoV-2 by FDA under an Emergency Use Authorization (EUA). This EUA will remain  in effect (meaning this test can be used) for the duration of the COVID-19 declaration under Section 564(b)(1) of the Act, 21 U.S.C.section 360bbb-3(b)(1), unless the authorization is terminated  or revoked sooner.       Influenza A by PCR NEGATIVE NEGATIVE Final   Influenza B by PCR NEGATIVE NEGATIVE Final    Comment:  (NOTE) The Xpert Xpress SARS-CoV-2/FLU/RSV plus assay is intended as an aid in the diagnosis of influenza from Nasopharyngeal swab specimens and should not be used as a sole basis for treatment. Nasal washings and aspirates are unacceptable for Xpert Xpress SARS-CoV-2/FLU/RSV testing.  Fact Sheet for Patients: EntrepreneurPulse.com.au  Fact Sheet for Healthcare Providers: IncredibleEmployment.be  This test is not yet approved or cleared by the [place] FDA and has been authorized for detection and/or diagnosis of SARS-CoV-2 by FDA under an Emergency Use Authorization (EUA). This EUA will remain in effect (meaning this test can be used) for the duration of the COVID-19 declaration under Section 564(b)(1) of the Act, 21 U.S.C. section 360bbb-3(b)(1), unless the authorization is terminated or revoked.  Performed at [facility], [place] [address] [place], [place] [address]          Radiology Studies: No results found.    Scheduled Meds:  atorvastatin  40 mg Oral Daily   digoxin  0.25 mg Oral Daily   divalproex  500 mg Oral BID   enoxaparin (LOVENOX) injection  40 mg Subcutaneous Q24H   melatonin  3 mg Oral QHS   mirtazapine  15 mg Oral QHS   nicotine  14 mg Transdermal Daily   paliperidone  3 mg Oral Daily   sacubitril-valsartan  1 tablet Oral BID   sodium chloride flush  3 mL Intravenous Q12H   sodium chloride  2 g Oral TID WC   Continuous Infusions:  sodium chloride       LOS: 0 days      Kayleen Memos, MD [facility] Pager: [email or web address] 09/29/2021, 2:03 PM

## 2021-09-30 DIAGNOSIS — E871 Hypo-osmolality and hyponatremia: Secondary | ICD-10-CM | POA: Diagnosis not present

## 2021-09-30 LAB — BASIC METABOLIC PANEL
Anion gap: 5 (ref 5–15)
BUN: 8 mg/dL (ref 6–20)
CO2: 28 mmol/L (ref 22–32)
Calcium: 8.6 mg/dL — ABNORMAL LOW (ref 8.9–10.3)
Chloride: 94 mmol/L — ABNORMAL LOW (ref 98–111)
Creatinine, Ser: 0.56 mg/dL — ABNORMAL LOW (ref 0.61–1.24)
GFR, Estimated: >60 mL/min (ref >60)
Glucose, Bld: 88 mg/dL (ref 70–99)
Potassium: 3.9 mmol/L (ref 3.5–5.1)
Sodium: 127 mmol/L — ABNORMAL LOW (ref 135–145)

## 2021-09-30 LAB — SODIUM: Sodium: 127 mmol/L — ABNORMAL LOW (ref 135–145)

## 2021-09-30 NOTE — Progress Notes (Signed)
PROGRESS NOTE    Jim Dunn   EXB:[id number]  DOB: 18-Apr-1966  DOA: 09/24/2021 PCP: Oneita Hurt, No   Brief Narrative:  Jim Dunn is a 56 year old male with a diagnosis of schizophrenia per chart and per psych.  He also has a history of chronic hyponatremia, seizure disorder, tobacco abuse, HFrEF 30 to 35% and COPD. He presented to the ED on 09/24/21 from home as family called EMS and said they were unable to take care of him.  They stated he was drinking a lot of water & trying to burn things.  The patient has been in the hospital multiple times this month as family continues to state that they are unable to care for him.  Apparantly, per notes,  he has been to group homes and has been kicked out.  Neurology and psychiatry were consulted in the ED to assist with the management of his underlying psych illness and seizure disorder.  Work-up revealed acute on chronic hypervolemic hyponatremia.  Patient was started on salt tablet on 09/28/2021.  Serum sodium uptrending 127 from 125 previously.  09/30/2021: Patient was seen at his bedside.  There were no acute events overnight.  He has no new complaints.  Looking for potential guardianship.  Appreciate TOC assistance with DC planning.    Assessment & Plan:    History of schizophrenia -Previously diagnosed with paranoid schizophrenia at a young age -Psych is recommending a one-to-one sitter for suicidal ideation- he does not have any suicidal thoughts on my exam. - continue mirtazapine and paliperidone 3 mg -Neuro has ordered further work-up-an EEG has been completed and is within normal limits -On my exam he is alert oriented to time place and person and states he lives at home with his mom. Appears stable.  Resolved Hypervolemic hyponatremia, acute on chronic. Volume status has normalized, suspect hyponatremia secondary to psych medications secondary to side effects. Continue to limit water intake to less than 1500 cc/day  Serum sodium  127. Goal serum sodium 130 or above  HFrEF 30 to 35% Euvolemic on exam 2D echo from 09/26/2021 reveals an EF of 30 to 35%, global hypokinesis of LV Continue home heart failure medications. Strict I's and O's and daily weight Net I&O +1.6 L  Seizure disorder Continue home AEDs Home Depakote 500 mg twice daily Continue seizure precautions   DVT prophylaxis: Lovenox subcu daily Code Status: Full code Family Communication: None at bedside. Level of Care: Level of care: Med-Surg Disposition Plan:  Status is: Inpatient   Patient requires at least 2 midnights for further evaluation and treatment of present condition.  Consultants:  Neurology and psychiatry Procedures:  EEG Antimicrobials:  Anti-infectives (From admission, onward)    None        Objective: Vitals:   09/29/21 2218 09/30/21 0546 09/30/21 0737 09/30/21 1221  BP: 130/77 (!) 142/75 134/85 (!) 174/84  Pulse: 92 73 74 83  Resp: 18 18 19 17   Temp: 98.3 F (36.8 C) 97.7 F (36.5 C) 98 F (36.7 C) 97.8 F (36.6 C)  TempSrc: Oral Axillary Oral   SpO2: 100% 99% 97% 98%   No intake or output data in the 24 hours ending 09/30/21 1527  There were no vitals filed for this visit.  Examination: General exam: Well-developed well-nourished in no acute distress.  He is alert and interactive.   HEENT: Atraumatic normocephalic. Respiratory system: Clear to auscultation with no wheezes or rales.  Cardiovascular system: Regular rate and rhythm no rubs or gallops. Gastrointestinal system: Soft monitor  normal bowel sounds present. Central nervous system: Alert and interactive. Extremities: No lower extremity edema bilaterally. Psychiatry: Mood is appropriate for condition and setting.    Data Reviewed: I have personally reviewed following labs and imaging studies  CBC: Recent Labs  Lab 09/24/21 1609  WBC 8.2  NEUTROABS 4.4  HGB 13.4  HCT 40.5  MCV 69.1*  PLT 232   Basic Metabolic Panel: Recent Labs  Lab  09/24/21 1609 09/25/21 1253 09/25/21 2003 09/28/21 0307 09/28/21 0900 09/29/21 0658 09/29/21 1511 09/29/21 2022 09/30/21 0312 09/30/21 0843  NA 129* 125*   < > 125*   < > 127* 128* 129* 127* 127*  K 3.8 3.6  --  4.0  --   --   --   --   --  3.9  CL 95* 96*  --  93*  --   --   --   --   --  94*  CO2 26 25  --  26  --   --   --   --   --  28  GLUCOSE 89 137*  --  88  --   --   --   --   --  88  BUN 6 5*  --  10  --   --   --   --   --  8  CREATININE 0.73 0.57*  --  0.63  --   --   --   --   --  0.56*  CALCIUM 8.8* 8.5*  --  8.9  --   --   --   --   --  8.6*   < > = values in this interval not displayed.   GFR: CrCl cannot be calculated (Unknown ideal weight.). Liver Function Tests: Recent Labs  Lab 09/24/21 1609  AST 22  ALT 11  ALKPHOS 87  BILITOT 0.7  PROT 7.1  ALBUMIN 3.8   No results for input(s): LIPASE, AMYLASE in the last 168 hours. Recent Labs  Lab 09/25/21 2003  AMMONIA 42*   Coagulation Profile: No results for input(s): INR, PROTIME in the last 168 hours. Cardiac Enzymes: No results for input(s): CKTOTAL, CKMB, CKMBINDEX, TROPONINI in the last 168 hours. BNP (last 3 results) No results for input(s): PROBNP in the last 8760 hours. HbA1C: No results for input(s): HGBA1C in the last 72 hours. CBG: No results for input(s): GLUCAP in the last 168 hours. Lipid Profile: No results for input(s): CHOL, HDL, LDLCALC, TRIG, CHOLHDL, LDLDIRECT in the last 72 hours. Thyroid Function Tests: No results for input(s): TSH, T4TOTAL, FREET4, T3FREE, THYROIDAB in the last 72 hours.  Anemia Panel: No results for input(s): VITAMINB12, FOLATE, FERRITIN, TIBC, IRON, RETICCTPCT in the last 72 hours.  Urine analysis:    Component Value Date/Time   COLORURINE YELLOW 02/23/2020 1910   APPEARANCEUR CLEAR 02/23/2020 1910   LABSPEC 1.006 02/23/2020 1910   PHURINE 7.0 02/23/2020 1910   GLUCOSEU NEGATIVE 02/23/2020 1910   HGBUR NEGATIVE 02/23/2020 1910   BILIRUBINUR NEGATIVE  02/23/2020 1910   KETONESUR NEGATIVE 02/23/2020 1910   PROTEINUR NEGATIVE 02/23/2020 1910   NITRITE NEGATIVE 02/23/2020 1910   LEUKOCYTESUR NEGATIVE 02/23/2020 1910   Sepsis Labs: @LABRCNTIP (procalcitonin:4,lacticidven:4) ) Recent Results (from the past 240 hour(s))  Resp Panel by RT-PCR (Flu A&B, Covid) Nasopharyngeal Swab     Status: None   Collection Time: 09/24/21  4:42 PM   Specimen: Nasopharyngeal Swab; Nasopharyngeal(NP) swabs in vial transport medium  Result Value Ref Range Status  SARS Coronavirus 2 by RT PCR NEGATIVE NEGATIVE Final    Comment: (NOTE) SARS-CoV-2 target nucleic acids are NOT DETECTED.  The SARS-CoV-2 RNA is generally detectable in upper respiratory specimens during the acute phase of infection. The lowest concentration of SARS-CoV-2 viral copies this assay can detect is 138 copies/mL. A negative result does not preclude SARS-Cov-2 infection and should not be used as the sole basis for treatment or other patient management decisions. A negative result may occur with  improper specimen collection/handling, submission of specimen other than nasopharyngeal swab, presence of viral mutation(s) within the areas targeted by this assay, and inadequate number of viral copies(<138 copies/mL). A negative result must be combined with clinical observations, patient history, and epidemiological information. The expected result is Negative.  Fact Sheet for Patients:  BloggerCourse.com  Fact Sheet for Healthcare Providers:  SeriousBroker.it  This test is no t yet approved or cleared by the [place] FDA and  has been authorized for detection and/or diagnosis of SARS-CoV-2 by FDA under an Emergency Use Authorization (EUA). This EUA will remain  in effect (meaning this test can be used) for the duration of the COVID-19 declaration under Section 564(b)(1) of the Act, 21 U.S.C.section 360bbb-3(b)(1), unless the  authorization is terminated  or revoked sooner.       Influenza A by PCR NEGATIVE NEGATIVE Final   Influenza B by PCR NEGATIVE NEGATIVE Final    Comment: (NOTE) The Xpert Xpress SARS-CoV-2/FLU/RSV plus assay is intended as an aid in the diagnosis of influenza from Nasopharyngeal swab specimens and should not be used as a sole basis for treatment. Nasal washings and aspirates are unacceptable for Xpert Xpress SARS-CoV-2/FLU/RSV testing.  Fact Sheet for Patients: BloggerCourse.com  Fact Sheet for Healthcare Providers: SeriousBroker.it  This test is not yet approved or cleared by the [place] FDA and has been authorized for detection and/or diagnosis of SARS-CoV-2 by FDA under an Emergency Use Authorization (EUA). This EUA will remain in effect (meaning this test can be used) for the duration of the COVID-19 declaration under Section 564(b)(1) of the Act, 21 U.S.C. section 360bbb-3(b)(1), unless the authorization is terminated or revoked.  Performed at [facility], [address] [place], [place] [address]          Radiology Studies: No results found.    Scheduled Meds:  atorvastatin  40 mg Oral Daily   digoxin  0.25 mg Oral Daily   divalproex  500 mg Oral BID   enoxaparin (LOVENOX) injection  40 mg Subcutaneous Q24H   melatonin  3 mg Oral QHS   mirtazapine  15 mg Oral QHS   nicotine  14 mg Transdermal Daily   paliperidone  3 mg Oral Daily   sacubitril-valsartan  1 tablet Oral BID   sodium chloride flush  3 mL Intravenous Q12H   sodium chloride  2 g Oral TID WC   Continuous Infusions:  sodium chloride       LOS: 1 day      Darlin Drop, MD [facility] Pager: [email or web address] 09/30/2021, 3:27 PM

## 2021-09-30 NOTE — Progress Notes (Signed)
Removed telemetry because patient would not comply. Notified hospitalist.

## 2021-09-30 NOTE — Progress Notes (Signed)
Around 1930, patient wandered off the unit despite staff explaining that he can not do this without a doctor's order.  Patient was found in nutrition room on 75[facility] by staff and brought back to [facility] safely.  Main door to [facility] has now been closed to deter behavior.

## 2021-10-01 DIAGNOSIS — E871 Hypo-osmolality and hyponatremia: Secondary | ICD-10-CM | POA: Diagnosis not present

## 2021-10-01 MED ORDER — FUROSEMIDE 20 MG PO TABS
20.0000 mg | ORAL_TABLET | Freq: Every day | ORAL | Status: DC
  Administered 2021-10-01 – 2021-10-30 (×30): 20 mg via ORAL
  Filled 2021-10-01 (×30): qty 1

## 2021-10-01 NOTE — Progress Notes (Signed)
PROGRESS NOTE    Jim Dunn   HWT:[id number]  DOB: 10-08-1965  DOA: 09/24/2021 PCP: Oneita Hurt, No   Brief Narrative:  Jim Dunn is a 56 year old male with a diagnosis of schizophrenia per chart and per psych.  He also has a history of chronic hyponatremia, seizure disorder, tobacco abuse, HFrEF 30 to 35% and COPD. He presented to the ED on 09/24/21 from home as family called EMS and said they were unable to take care of him.  They stated he was drinking a lot of water & trying to burn things.  The patient has been in the hospital multiple times this month as family continues to state that they are unable to care for him.  Apparantly, per notes,  he has been to group homes and has been kicked out.  Neurology and psychiatry were consulted in the ED to assist with the management of his underlying psych illness and seizure disorder.  Work-up revealed acute on chronic hypervolemic hyponatremia.  Patient was started on salt tablet on 09/28/2021.  Serum sodium uptrending 127 from 125 previously.  10/01/2021: Patient was seen at his bedside.  No acute events overnight.  He has no complaints.   Assessment & Plan:    History of schizophrenia -Previously diagnosed with paranoid schizophrenia at a young age -Psych is recommending a one-to-one sitter for suicidal ideation- he does not have any suicidal thoughts on my exam. - continue mirtazapine and paliperidone 3 mg -Neuro has ordered further work-up-an EEG has been completed and is within normal limits -On my exam he is alert oriented to time place and person and states he lives at home with his mom. Appears stable.  Resolved Hypervolemic hyponatremia, acute on chronic. Volume status has normalized, suspect hyponatremia secondary to psych medications secondary to side effects. Continue to limit water intake to less than 1500 cc/day  Serum sodium 127. Goal serum sodium 130 or above  HFrEF 30 to 35% Euvolemic on exam 2D echo from 09/26/2021 reveals  an EF of 30 to 35%, global hypokinesis of LV Continue home heart failure medications. Strict I's and O's and daily weight Net I&O +1.6 L  Seizure disorder Continue home AEDs Home Depakote 500 mg twice daily Continue seizure precautions   DVT prophylaxis: Lovenox subcu daily Code Status: Full code Family Communication: None at bedside. Level of Care: Level of care: Med-Surg Disposition Plan:  Status is: Inpatient   Patient requires at least 2 midnights for further evaluation and treatment of present condition.  Consultants:  Neurology and psychiatry Procedures:  EEG Antimicrobials:  Anti-infectives (From admission, onward)    None        Objective: Vitals:   09/30/21 0737 09/30/21 1221 09/30/21 2114 10/01/21 0803  BP: 134/85 (!) 174/84 (!) 153/86 (!) 158/87  Pulse: 74 83 81 73  Resp: 19 17 17 17   Temp: 98 F (36.7 C) 97.8 F (36.6 C) 98 F (36.7 C) 98.3 F (36.8 C)  TempSrc: Oral  Oral Oral  SpO2: 97% 98% 98% 100%    Intake/Output Summary (Last 24 hours) at 10/01/2021 1142 Last data filed at 09/30/2021 2231 Gross per 24 hour  Intake 360 ml  Output --  Net 360 ml    There were no vitals filed for this visit.  Examination: General exam: Well-developed well-nourished in no acute distress.  He is alert and interactive.   HEENT: Atraumatic normocephalic. Respiratory system: Clear to auscultation with no wheezes or rales.  Cardiovascular system: Regular rate and rhythm no rubs or  gallops. Gastrointestinal system: Soft monitor normal bowel sounds present. Central nervous system: Alert and interactive. Extremities: No lower extremity edema bilaterally. Psychiatry: Mood is appropriate for condition and setting.    Data Reviewed: I have personally reviewed following labs and imaging studies  CBC: Recent Labs  Lab 09/24/21 1609  WBC 8.2  NEUTROABS 4.4  HGB 13.4  HCT 40.5  MCV 69.1*  PLT 232   Basic Metabolic Panel: Recent Labs  Lab 09/24/21 1609  09/25/21 1253 09/25/21 2003 09/28/21 0307 09/28/21 0900 09/29/21 0658 09/29/21 1511 09/29/21 2022 09/30/21 0312 09/30/21 0843  NA 129* 125*   < > 125*   < > 127* 128* 129* 127* 127*  K 3.8 3.6  --  4.0  --   --   --   --   --  3.9  CL 95* 96*  --  93*  --   --   --   --   --  94*  CO2 26 25  --  26  --   --   --   --   --  28  GLUCOSE 89 137*  --  88  --   --   --   --   --  88  BUN 6 5*  --  10  --   --   --   --   --  8  CREATININE 0.73 0.57*  --  0.63  --   --   --   --   --  0.56*  CALCIUM 8.8* 8.5*  --  8.9  --   --   --   --   --  8.6*   < > = values in this interval not displayed.   GFR: CrCl cannot be calculated (Unknown ideal weight.). Liver Function Tests: Recent Labs  Lab 09/24/21 1609  AST 22  ALT 11  ALKPHOS 87  BILITOT 0.7  PROT 7.1  ALBUMIN 3.8   No results for input(s): LIPASE, AMYLASE in the last 168 hours. Recent Labs  Lab 09/25/21 2003  AMMONIA 42*   Coagulation Profile: No results for input(s): INR, PROTIME in the last 168 hours. Cardiac Enzymes: No results for input(s): CKTOTAL, CKMB, CKMBINDEX, TROPONINI in the last 168 hours. BNP (last 3 results) No results for input(s): PROBNP in the last 8760 hours. HbA1C: No results for input(s): HGBA1C in the last 72 hours. CBG: No results for input(s): GLUCAP in the last 168 hours. Lipid Profile: No results for input(s): CHOL, HDL, LDLCALC, TRIG, CHOLHDL, LDLDIRECT in the last 72 hours. Thyroid Function Tests: No results for input(s): TSH, T4TOTAL, FREET4, T3FREE, THYROIDAB in the last 72 hours.  Anemia Panel: No results for input(s): VITAMINB12, FOLATE, FERRITIN, TIBC, IRON, RETICCTPCT in the last 72 hours.  Urine analysis:    Component Value Date/Time   COLORURINE YELLOW 02/23/2020 1910   APPEARANCEUR CLEAR 02/23/2020 1910   LABSPEC 1.006 02/23/2020 1910   PHURINE 7.0 02/23/2020 1910   GLUCOSEU NEGATIVE 02/23/2020 1910   HGBUR NEGATIVE 02/23/2020 1910   BILIRUBINUR NEGATIVE 02/23/2020  1910   KETONESUR NEGATIVE 02/23/2020 1910   PROTEINUR NEGATIVE 02/23/2020 1910   NITRITE NEGATIVE 02/23/2020 1910   LEUKOCYTESUR NEGATIVE 02/23/2020 1910   Sepsis Labs: @LABRCNTIP (procalcitonin:4,lacticidven:4) ) Recent Results (from the past 240 hour(s))  Resp Panel by RT-PCR (Flu A&B, Covid) Nasopharyngeal Swab     Status: None   Collection Time: 09/24/21  4:42 PM   Specimen: Nasopharyngeal Swab; Nasopharyngeal(NP) swabs in vial transport medium  Result Value  Ref Range Status   SARS Coronavirus 2 by RT PCR NEGATIVE NEGATIVE Final    Comment: (NOTE) SARS-CoV-2 target nucleic acids are NOT DETECTED.  The SARS-CoV-2 RNA is generally detectable in upper respiratory specimens during the acute phase of infection. The lowest concentration of SARS-CoV-2 viral copies this assay can detect is 138 copies/mL. A negative result does not preclude SARS-Cov-2 infection and should not be used as the sole basis for treatment or other patient management decisions. A negative result may occur with  improper specimen collection/handling, submission of specimen other than nasopharyngeal swab, presence of viral mutation(s) within the areas targeted by this assay, and inadequate number of viral copies(<138 copies/mL). A negative result must be combined with clinical observations, patient history, and epidemiological information. The expected result is Negative.  Fact Sheet for Patients:  BloggerCourse.com  Fact Sheet for Healthcare Providers:  SeriousBroker.it  This test is no t yet approved or cleared by the [place] FDA and  has been authorized for detection and/or diagnosis of SARS-CoV-2 by FDA under an Emergency Use Authorization (EUA). This EUA will remain  in effect (meaning this test can be used) for the duration of the COVID-19 declaration under Section 564(b)(1) of the Act, 21 U.S.C.section 360bbb-3(b)(1), unless the authorization is  terminated  or revoked sooner.       Influenza A by PCR NEGATIVE NEGATIVE Final   Influenza B by PCR NEGATIVE NEGATIVE Final    Comment: (NOTE) The Xpert Xpress SARS-CoV-2/FLU/RSV plus assay is intended as an aid in the diagnosis of influenza from Nasopharyngeal swab specimens and should not be used as a sole basis for treatment. Nasal washings and aspirates are unacceptable for Xpert Xpress SARS-CoV-2/FLU/RSV testing.  Fact Sheet for Patients: BloggerCourse.com  Fact Sheet for Healthcare Providers: SeriousBroker.it  This test is not yet approved or cleared by the [place] FDA and has been authorized for detection and/or diagnosis of SARS-CoV-2 by FDA under an Emergency Use Authorization (EUA). This EUA will remain in effect (meaning this test can be used) for the duration of the COVID-19 declaration under Section 564(b)(1) of the Act, 21 U.S.C. section 360bbb-3(b)(1), unless the authorization is terminated or revoked.  Performed at [facility], [address] [place], [place] [address]          Radiology Studies: No results found.    Scheduled Meds:  atorvastatin  40 mg Oral Daily   digoxin  0.25 mg Oral Daily   divalproex  500 mg Oral BID   enoxaparin (LOVENOX) injection  40 mg Subcutaneous Q24H   melatonin  3 mg Oral QHS   mirtazapine  15 mg Oral QHS   nicotine  14 mg Transdermal Daily   paliperidone  3 mg Oral Daily   sacubitril-valsartan  1 tablet Oral BID   sodium chloride flush  3 mL Intravenous Q12H   sodium chloride  2 g Oral TID WC   Continuous Infusions:  sodium chloride       LOS: 2 days      Darlin Drop, MD [facility] Pager: [email or web address] 10/01/2021, 11:42 AM

## 2021-10-02 DIAGNOSIS — E871 Hypo-osmolality and hyponatremia: Secondary | ICD-10-CM | POA: Diagnosis not present

## 2021-10-02 LAB — BASIC METABOLIC PANEL
Anion gap: 7 (ref 5–15)
BUN: 8 mg/dL (ref 6–20)
CO2: 27 mmol/L (ref 22–32)
Calcium: 8.5 mg/dL — ABNORMAL LOW (ref 8.9–10.3)
Chloride: 94 mmol/L — ABNORMAL LOW (ref 98–111)
Creatinine, Ser: 0.63 mg/dL (ref 0.61–1.24)
GFR, Estimated: >60 mL/min (ref >60)
Glucose, Bld: 86 mg/dL (ref 70–99)
Potassium: 4 mmol/L (ref 3.5–5.1)
Sodium: 128 mmol/L — ABNORMAL LOW (ref 135–145)

## 2021-10-02 NOTE — Progress Notes (Signed)
Mobility Specialist Progress Note   10/02/21 1700  Mobility  Activity Ambulated independently in hallway  Level of Assistance Independent after set-up  Assistive Device None  Distance Ambulated (ft) 550 ft  Activity Response Tolerated well  $Mobility charge 1 Mobility   Pt ambulates independently and needs no physical assistance but requires cues for attentiveness and direction. After ambulating today pt c/o being slightly dizzy, once back in room took BP which came out to be 147/58. Left in bed w/ dizziness resided no further complaints and call bell in reach.    Frederico Hamman Mobility Specialist Phone Number [phone]

## 2021-10-02 NOTE — Progress Notes (Signed)
Mobility Specialist Progress Note   10/02/21 1700  Mobility  Activity Ambulated independently in hallway  Level of Assistance Independent after set-up  Assistive Device None  Distance Ambulated (ft) 550 ft  Activity Response Tolerated well  $Mobility charge 1 Mobility     Frederico Hamman Mobility Specialist Phone Number [phone]

## 2021-10-02 NOTE — Progress Notes (Signed)
PROGRESS NOTE    Jim Dunn   KZS:[id number]  DOB: 06/22/1966  DOA: 09/24/2021 PCP: Oneita Hurt, No   Brief Narrative:  Jim Dunn is a 56 year old male with a diagnosis of schizophrenia per chart and per psych.  He also has a history of chronic hyponatremia, seizure disorder, tobacco abuse, HFrEF 30 to 35% and COPD. He presented to the ED on 09/24/21 from home as family called EMS and said they were unable to take care of him.  They stated he was drinking a lot of water & trying to burn things.  The patient has been in the hospital multiple times this month as family continues to state that they are unable to care for him.  Apparantly, per notes,  he has been to group homes and has been kicked out.  Neurology and psychiatry were consulted in the ED to assist with the management of his underlying psych illness and seizure disorder.  Work-up revealed acute on chronic hypervolemic hyponatremia.  Patient was started on salt tablet on 09/28/2021.  Serum sodium uptrending 127 from 125 previously.  10/02/2021: Patient was seen at his bedside.  No acute events overnight.  He has no complaints.   Assessment & Plan:    History of schizophrenia -Previously diagnosed with paranoid schizophrenia at a young age -Psych is recommending a one-to-one sitter for suicidal ideation- he does not have any suicidal thoughts on my exam. - continue mirtazapine and paliperidone 3 mg -Neuro has ordered further work-up-an EEG has been completed and is within normal limits -On my exam he is alert oriented to time place and person and states he lives at home with his mom. Appears stable.  Resolved Hypervolemic hyponatremia, acute on chronic. Volume status has normalized, suspect hyponatremia secondary to psych medications secondary to side effects. Continue to limit water intake to less than 1500 cc/day  Serum sodium 127. Goal serum sodium 130 or above  HFrEF 30 to 35% Euvolemic on exam 2D echo from 09/26/2021 reveals  an EF of 30 to 35%, global hypokinesis of LV Continue home heart failure medications. Strict I's and O's and daily weight Net I&O +1.6 L  Seizure disorder Continue home AEDs Home Depakote 500 mg twice daily Continue seizure precautions   DVT prophylaxis: Lovenox subcu daily Code Status: Full code Family Communication: None at bedside. Level of Care: Level of care: Med-Surg Disposition Plan:  Status is: Inpatient   Patient requires at least 2 midnights for further evaluation and treatment of present condition.  Consultants:  Neurology and psychiatry Procedures:  EEG Antimicrobials:  Anti-infectives (From admission, onward)    None        Objective: Vitals:   09/30/21 2114 10/01/21 0803 10/01/21 1411 10/01/21 2049  BP: (!) 153/86 (!) 158/87 (!) 174/94 136/68  Pulse: 81 73 80 89  Resp: 17 17 16 18   Temp: 98 F (36.7 C) 98.3 F (36.8 C) 98.9 F (37.2 C) 99.5 F (37.5 C)  TempSrc: Oral Oral Oral Oral  SpO2: 98% 100% 100% 95%    Intake/Output Summary (Last 24 hours) at 10/02/2021 1409 Last data filed at 10/02/2021 1248 Gross per 24 hour  Intake 720 ml  Output --  Net 720 ml    There were no vitals filed for this visit.  Examination: General exam: Well-developed well-nourished in no acute distress.  He is alert and interactive.   HEENT: Atraumatic normocephalic. Respiratory system: Clear to auscultation with no wheezes or rales.  Cardiovascular system: Regular rate and rhythm no rubs or  gallops. Gastrointestinal system: Soft monitor normal bowel sounds present. Central nervous system: Alert and interactive. Extremities: No lower extremity edema bilaterally. Psychiatry: Mood is appropriate for condition and setting.    Data Reviewed: I have personally reviewed following labs and imaging studies  CBC: No results for input(s): WBC, NEUTROABS, HGB, HCT, MCV, PLT in the last 168 hours.  Basic Metabolic Panel: Recent Labs  Lab 09/28/21 0307  09/28/21 0900 09/29/21 1511 09/29/21 2022 09/30/21 0312 09/30/21 0843 10/02/21 0604  NA 125*   < > 128* 129* 127* 127* 128*  K 4.0  --   --   --   --  3.9 4.0  CL 93*  --   --   --   --  94* 94*  CO2 26  --   --   --   --  28 27  GLUCOSE 88  --   --   --   --  88 86  BUN 10  --   --   --   --  8 8  CREATININE 0.63  --   --   --   --  0.56* 0.63  CALCIUM 8.9  --   --   --   --  8.6* 8.5*   < > = values in this interval not displayed.   GFR: CrCl cannot be calculated (Unknown ideal weight.). Liver Function Tests: No results for input(s): AST, ALT, ALKPHOS, BILITOT, PROT, ALBUMIN in the last 168 hours.  No results for input(s): LIPASE, AMYLASE in the last 168 hours. Recent Labs  Lab 09/25/21 2003  AMMONIA 42*   Coagulation Profile: No results for input(s): INR, PROTIME in the last 168 hours. Cardiac Enzymes: No results for input(s): CKTOTAL, CKMB, CKMBINDEX, TROPONINI in the last 168 hours. BNP (last 3 results) No results for input(s): PROBNP in the last 8760 hours. HbA1C: No results for input(s): HGBA1C in the last 72 hours. CBG: No results for input(s): GLUCAP in the last 168 hours. Lipid Profile: No results for input(s): CHOL, HDL, LDLCALC, TRIG, CHOLHDL, LDLDIRECT in the last 72 hours. Thyroid Function Tests: No results for input(s): TSH, T4TOTAL, FREET4, T3FREE, THYROIDAB in the last 72 hours.  Anemia Panel: No results for input(s): VITAMINB12, FOLATE, FERRITIN, TIBC, IRON, RETICCTPCT in the last 72 hours.  Urine analysis:    Component Value Date/Time   COLORURINE YELLOW 02/23/2020 1910   APPEARANCEUR CLEAR 02/23/2020 1910   LABSPEC 1.006 02/23/2020 1910   PHURINE 7.0 02/23/2020 1910   GLUCOSEU NEGATIVE 02/23/2020 1910   HGBUR NEGATIVE 02/23/2020 1910   BILIRUBINUR NEGATIVE 02/23/2020 1910   KETONESUR NEGATIVE 02/23/2020 1910   PROTEINUR NEGATIVE 02/23/2020 1910   NITRITE NEGATIVE 02/23/2020 1910   LEUKOCYTESUR NEGATIVE 02/23/2020 1910   Sepsis  Labs: @LABRCNTIP (procalcitonin:4,lacticidven:4) ) Recent Results (from the past 240 hour(s))  Resp Panel by RT-PCR (Flu A&B, Covid) Nasopharyngeal Swab     Status: None   Collection Time: 09/24/21  4:42 PM   Specimen: Nasopharyngeal Swab; Nasopharyngeal(NP) swabs in vial transport medium  Result Value Ref Range Status   SARS Coronavirus 2 by RT PCR NEGATIVE NEGATIVE Final    Comment: (NOTE) SARS-CoV-2 target nucleic acids are NOT DETECTED.  The SARS-CoV-2 RNA is generally detectable in upper respiratory specimens during the acute phase of infection. The lowest concentration of SARS-CoV-2 viral copies this assay can detect is 138 copies/mL. A negative result does not preclude SARS-Cov-2 infection and should not be used as the sole basis for treatment or other patient management decisions.  A negative result may occur with  improper specimen collection/handling, submission of specimen other than nasopharyngeal swab, presence of viral mutation(s) within the areas targeted by this assay, and inadequate number of viral copies(<138 copies/mL). A negative result must be combined with clinical observations, patient history, and epidemiological information. The expected result is Negative.  Fact Sheet for Patients:  EntrepreneurPulse.com.au  Fact Sheet for Healthcare Providers:  IncredibleEmployment.be  This test is no t yet approved or cleared by the [place] FDA and  has been authorized for detection and/or diagnosis of SARS-CoV-2 by FDA under an Emergency Use Authorization (EUA). This EUA will remain  in effect (meaning this test can be used) for the duration of the COVID-19 declaration under Section 564(b)(1) of the Act, 21 U.S.C.section 360bbb-3(b)(1), unless the authorization is terminated  or revoked sooner.       Influenza A by PCR NEGATIVE NEGATIVE Final   Influenza B by PCR NEGATIVE NEGATIVE Final    Comment: (NOTE) The Xpert Xpress  SARS-CoV-2/FLU/RSV plus assay is intended as an aid in the diagnosis of influenza from Nasopharyngeal swab specimens and should not be used as a sole basis for treatment. Nasal washings and aspirates are unacceptable for Xpert Xpress SARS-CoV-2/FLU/RSV testing.  Fact Sheet for Patients: EntrepreneurPulse.com.au  Fact Sheet for Healthcare Providers: IncredibleEmployment.be  This test is not yet approved or cleared by the [place] FDA and has been authorized for detection and/or diagnosis of SARS-CoV-2 by FDA under an Emergency Use Authorization (EUA). This EUA will remain in effect (meaning this test can be used) for the duration of the COVID-19 declaration under Section 564(b)(1) of the Act, 21 U.S.C. section 360bbb-3(b)(1), unless the authorization is terminated or revoked.  Performed at [facility], [place] [address] [place], [place] [address]          Radiology Studies: No results found.    Scheduled Meds:  atorvastatin  40 mg Oral Daily   digoxin  0.25 mg Oral Daily   divalproex  500 mg Oral BID   enoxaparin (LOVENOX) injection  40 mg Subcutaneous Q24H   furosemide  20 mg Oral Daily   melatonin  3 mg Oral QHS   mirtazapine  15 mg Oral QHS   nicotine  14 mg Transdermal Daily   paliperidone  3 mg Oral Daily   sacubitril-valsartan  1 tablet Oral BID   sodium chloride flush  3 mL Intravenous Q12H   sodium chloride  2 g Oral TID WC   Continuous Infusions:  sodium chloride       LOS: 3 days      Kayleen Memos, MD [facility] Pager: [email or web address] 10/02/2021, 2:09 PM

## 2021-10-02 NOTE — Plan of Care (Signed)
  Problem: Health Behavior/Discharge Planning: Goal: Ability to manage health-related needs will improve Outcome: Progressing   Problem: Clinical Measurements: Goal: Ability to maintain clinical measurements within normal limits will improve Outcome: Progressing   

## 2021-10-02 NOTE — Progress Notes (Signed)
Patient pulled PIV out.  Site dry, no bleeding noted.  Kept site open to air.  Dr. Margo Aye made aware, MD ok with no PIV access.

## 2021-10-02 NOTE — Plan of Care (Signed)

## 2021-10-03 ENCOUNTER — Ambulatory Visit (INDEPENDENT_AMBULATORY_CARE_PROVIDER_SITE_OTHER): Payer: Medicare PPO | Admitting: Primary Care

## 2021-10-03 DIAGNOSIS — E871 Hypo-osmolality and hyponatremia: Secondary | ICD-10-CM | POA: Diagnosis not present

## 2021-10-03 NOTE — Care Management Important Message (Signed)
Important Message  Patient Details  Name: Leib Elahi MRN: [id number] Date of Birth: Feb 10, 1966   Medicare Important Message Given:  Yes     Sherilyn Banker 10/03/2021, 12:01 PM

## 2021-10-03 NOTE — Progress Notes (Signed)
PROGRESS NOTE    Jim Dunn   WKG:[id number]  DOB: 1966/03/07  DOA: 09/24/2021 PCP: Oneita Hurt, No   Brief Narrative:  Jim Dunn is a 56 year old male with a diagnosis of schizophrenia per chart and per psych.  He also has a history of chronic hyponatremia, seizure disorder, tobacco abuse, HFrEF 30 to 35% and COPD. He presented to the ED on 09/24/21 from home as family called EMS and said they were unable to take care of him.  They stated he was drinking a lot of water & trying to burn things.  The patient has been in the hospital multiple times this month as family continues to state that they are unable to care for him.  Apparantly, per notes,  he has been to group homes and has been kicked out.  Neurology and psychiatry were consulted in the ED to assist with the management of his underlying psych illness and seizure disorder.  Work-up revealed acute on chronic hypervolemic hyponatremia.  Patient was started on salt tablet on 09/28/2021.  Serum sodium uptrending 127 from 125 previously.  10/03/2021: Seen at his bedside.  There were no acute events overnight.  He has no new complaints.  His serum sodium is back to baseline.  Awaiting placement.  Assessment & Plan:    History of schizophrenia -Previously diagnosed with paranoid schizophrenia at a young age -Psych is recommending a one-to-one sitter for suicidal ideation- he does not have any suicidal thoughts on my exam. - continue mirtazapine and paliperidone 3 mg -Neuro has ordered further work-up-an EEG has been completed and is within normal limits -On my exam he is alert oriented to time place and person and states he lives at home with his mom. Appears stable.  Resolved Hypervolemic hyponatremia, acute on chronic. Volume status has normalized, suspect hyponatremia secondary to psych medications secondary to side effects. Continue to limit water intake to less than 1500 cc/day  Serum sodium 127. Goal serum sodium 130 or above  HFrEF  30 to 35% Euvolemic on exam 2D echo from 09/26/2021 reveals an EF of 30 to 35%, global hypokinesis of LV Continue home heart failure medications. Strict I's and O's and daily weight Net I&O +1.6 L  Seizure disorder Continue home AEDs Home Depakote 500 mg twice daily Continue seizure precautions   DVT prophylaxis: Lovenox subcu daily Code Status: Full code Family Communication: None at bedside. Level of Care: Level of care: Med-Surg Disposition Plan:  Status is: Inpatient   Patient requires at least 2 midnights for further evaluation and treatment of present condition.  Consultants:  Neurology and psychiatry Procedures:  EEG Antimicrobials:  Anti-infectives (From admission, onward)    None        Objective: Vitals:   10/02/21 2200 10/03/21 0422 10/03/21 0700 10/03/21 0841  BP: (!) 147/81 118/65 (!) 161/79   Pulse: 81 71 60 80  Resp: 19 16 18    Temp: 98 F (36.7 C) 98.5 F (36.9 C) 98 F (36.7 C)   TempSrc: Oral  Oral   SpO2: 100% 97% 97%     Intake/Output Summary (Last 24 hours) at 10/03/2021 1041 Last data filed at 10/02/2021 1248 Gross per 24 hour  Intake 240 ml  Output --  Net 240 ml    There were no vitals filed for this visit.  Examination: No significant changes from prior exam. General exam: Well-developed well-nourished in no acute distress.  He is alert and interactive.   HEENT: Atraumatic normocephalic. Respiratory system: Clear to auscultation with no  wheezes or rales.  Cardiovascular system: Regular rate and rhythm no rubs or gallops. Gastrointestinal system: Soft monitor normal bowel sounds present. Central nervous system: Alert and interactive. Extremities: No lower extremity edema bilaterally. Psychiatry: Mood is appropriate for condition and setting.    Data Reviewed: I have personally reviewed following labs and imaging studies  CBC: No results for input(s): WBC, NEUTROABS, HGB, HCT, MCV, PLT in the last 168 hours.  Basic  Metabolic Panel: Recent Labs  Lab 09/28/21 0307 09/28/21 0900 09/29/21 1511 09/29/21 2022 09/30/21 0312 09/30/21 0843 10/02/21 0604  NA 125*   < > 128* 129* 127* 127* 128*  K 4.0  --   --   --   --  3.9 4.0  CL 93*  --   --   --   --  94* 94*  CO2 26  --   --   --   --  28 27  GLUCOSE 88  --   --   --   --  88 86  BUN 10  --   --   --   --  8 8  CREATININE 0.63  --   --   --   --  0.56* 0.63  CALCIUM 8.9  --   --   --   --  8.6* 8.5*   < > = values in this interval not displayed.   GFR: CrCl cannot be calculated (Unknown ideal weight.). Liver Function Tests: No results for input(s): AST, ALT, ALKPHOS, BILITOT, PROT, ALBUMIN in the last 168 hours.  No results for input(s): LIPASE, AMYLASE in the last 168 hours. No results for input(s): AMMONIA in the last 168 hours.  Coagulation Profile: No results for input(s): INR, PROTIME in the last 168 hours. Cardiac Enzymes: No results for input(s): CKTOTAL, CKMB, CKMBINDEX, TROPONINI in the last 168 hours. BNP (last 3 results) No results for input(s): PROBNP in the last 8760 hours. HbA1C: No results for input(s): HGBA1C in the last 72 hours. CBG: No results for input(s): GLUCAP in the last 168 hours. Lipid Profile: No results for input(s): CHOL, HDL, LDLCALC, TRIG, CHOLHDL, LDLDIRECT in the last 72 hours. Thyroid Function Tests: No results for input(s): TSH, T4TOTAL, FREET4, T3FREE, THYROIDAB in the last 72 hours.  Anemia Panel: No results for input(s): VITAMINB12, FOLATE, FERRITIN, TIBC, IRON, RETICCTPCT in the last 72 hours.  Urine analysis:    Component Value Date/Time   COLORURINE YELLOW 02/23/2020 1910   APPEARANCEUR CLEAR 02/23/2020 1910   LABSPEC 1.006 02/23/2020 1910   PHURINE 7.0 02/23/2020 1910   GLUCOSEU NEGATIVE 02/23/2020 1910   HGBUR NEGATIVE 02/23/2020 1910   BILIRUBINUR NEGATIVE 02/23/2020 1910   KETONESUR NEGATIVE 02/23/2020 1910   PROTEINUR NEGATIVE 02/23/2020 1910   NITRITE NEGATIVE 02/23/2020 1910    LEUKOCYTESUR NEGATIVE 02/23/2020 1910   Sepsis Labs: @LABRCNTIP (procalcitonin:4,lacticidven:4) ) Recent Results (from the past 240 hour(s))  Resp Panel by RT-PCR (Flu A&B, Covid) Nasopharyngeal Swab     Status: None   Collection Time: 09/24/21  4:42 PM   Specimen: Nasopharyngeal Swab; Nasopharyngeal(NP) swabs in vial transport medium  Result Value Ref Range Status   SARS Coronavirus 2 by RT PCR NEGATIVE NEGATIVE Final    Comment: (NOTE) SARS-CoV-2 target nucleic acids are NOT DETECTED.  The SARS-CoV-2 RNA is generally detectable in upper respiratory specimens during the acute phase of infection. The lowest concentration of SARS-CoV-2 viral copies this assay can detect is 138 copies/mL. A negative result does not preclude SARS-Cov-2 infection and should not  be used as the sole basis for treatment or other patient management decisions. A negative result may occur with  improper specimen collection/handling, submission of specimen other than nasopharyngeal swab, presence of viral mutation(s) within the areas targeted by this assay, and inadequate number of viral copies(<138 copies/mL). A negative result must be combined with clinical observations, patient history, and epidemiological information. The expected result is Negative.  Fact Sheet for Patients:  BloggerCourse.com  Fact Sheet for Healthcare Providers:  SeriousBroker.it  This test is no t yet approved or cleared by the [place] FDA and  has been authorized for detection and/or diagnosis of SARS-CoV-2 by FDA under an Emergency Use Authorization (EUA). This EUA will remain  in effect (meaning this test can be used) for the duration of the COVID-19 declaration under Section 564(b)(1) of the Act, 21 U.S.C.section 360bbb-3(b)(1), unless the authorization is terminated  or revoked sooner.       Influenza A by PCR NEGATIVE NEGATIVE Final   Influenza B by PCR NEGATIVE  NEGATIVE Final    Comment: (NOTE) The Xpert Xpress SARS-CoV-2/FLU/RSV plus assay is intended as an aid in the diagnosis of influenza from Nasopharyngeal swab specimens and should not be used as a sole basis for treatment. Nasal washings and aspirates are unacceptable for Xpert Xpress SARS-CoV-2/FLU/RSV testing.  Fact Sheet for Patients: BloggerCourse.com  Fact Sheet for Healthcare Providers: SeriousBroker.it  This test is not yet approved or cleared by the [place] FDA and has been authorized for detection and/or diagnosis of SARS-CoV-2 by FDA under an Emergency Use Authorization (EUA). This EUA will remain in effect (meaning this test can be used) for the duration of the COVID-19 declaration under Section 564(b)(1) of the Act, 21 U.S.C. section 360bbb-3(b)(1), unless the authorization is terminated or revoked.  Performed at [facility], [address] [place], [place] [address]          Radiology Studies: No results found.    Scheduled Meds:  atorvastatin  40 mg Oral Daily   digoxin  0.25 mg Oral Daily   divalproex  500 mg Oral BID   enoxaparin (LOVENOX) injection  40 mg Subcutaneous Q24H   furosemide  20 mg Oral Daily   melatonin  3 mg Oral QHS   mirtazapine  15 mg Oral QHS   nicotine  14 mg Transdermal Daily   paliperidone  3 mg Oral Daily   sacubitril-valsartan  1 tablet Oral BID   sodium chloride flush  3 mL Intravenous Q12H   sodium chloride  2 g Oral TID WC   Continuous Infusions:  sodium chloride       LOS: 4 days      Darlin Drop, MD [facility] Pager: [email or web address] 10/03/2021, 10:41 AM

## 2021-10-03 NOTE — Plan of Care (Signed)
  Problem: Health Behavior/Discharge Planning: Goal: Ability to manage health-related needs will improve Outcome: Progressing   Problem: Clinical Measurements: Goal: Ability to maintain clinical measurements within normal limits will improve Outcome: Progressing   

## 2021-10-03 NOTE — Progress Notes (Signed)
Mobility Specialist Progress Note   10/03/21 1225  Mobility  Activity Ambulated independently in hallway  Level of Assistance Independent  Assistive Device None  Distance Ambulated (ft) 700 ft  Activity Response Tolerated well  $Mobility charge 1 Mobility   Received pt in bed having no complaints and agreeable to mobility. Asymptomatic throughout ambulation, returned back to bed w/ call bell in reach and all needs met.  Holland Falling Mobility Specialist Phone Number [phone]

## 2021-10-04 DIAGNOSIS — E871 Hypo-osmolality and hyponatremia: Secondary | ICD-10-CM | POA: Diagnosis not present

## 2021-10-04 NOTE — Progress Notes (Signed)
Mobility Specialist Progress Note   10/04/21 1700  Mobility  Activity Ambulated independently in hallway  Level of Assistance Independent  Assistive Device None  Distance Ambulated (ft) 1100 ft  Activity Response Tolerated well  $Mobility charge 1 Mobility   Received pt in bed having no complaints and agreeable to mobility. Asymptomatic throughout ambulation, returned back to bed w/ call bell in reach and all needs met.  Holland Falling Mobility Specialist Phone Number [phone]
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
## 2021-10-05 DIAGNOSIS — R4182 Altered mental status, unspecified: Secondary | ICD-10-CM | POA: Diagnosis not present

## 2021-10-05 DIAGNOSIS — E871 Hypo-osmolality and hyponatremia: Secondary | ICD-10-CM | POA: Diagnosis not present

## 2021-10-05 DIAGNOSIS — F200 Paranoid schizophrenia: Secondary | ICD-10-CM | POA: Diagnosis not present

## 2021-10-05 LAB — BASIC METABOLIC PANEL
Anion gap: 8 (ref 5–15)
BUN: 8 mg/dL (ref 6–20)
CO2: 24 mmol/L (ref 22–32)
Calcium: 8.7 mg/dL — ABNORMAL LOW (ref 8.9–10.3)
Chloride: 95 mmol/L — ABNORMAL LOW (ref 98–111)
Creatinine, Ser: 0.59 mg/dL — ABNORMAL LOW (ref 0.61–1.24)
GFR, Estimated: >60 mL/min (ref >60)
Glucose, Bld: 94 mg/dL (ref 70–99)
Potassium: 4.1 mmol/L (ref 3.5–5.1)
Sodium: 127 mmol/L — ABNORMAL LOW (ref 135–145)

## 2021-10-05 MED ORDER — POLYETHYLENE GLYCOL 3350 17 G PO PACK
17.0000 g | PACK | Freq: Every day | ORAL | Status: DC
  Administered 2021-10-05 – 2021-10-31 (×15): 17 g via ORAL
  Filled 2021-10-05 (×24): qty 1

## 2021-10-05 MED ORDER — SENNOSIDES-DOCUSATE SODIUM 8.6-50 MG PO TABS
2.0000 | ORAL_TABLET | Freq: Two times a day (BID) | ORAL | Status: DC
  Administered 2021-10-05 – 2022-04-07 (×343): 2 via ORAL
  Filled 2021-10-05 (×358): qty 2

## 2021-10-05 MED ORDER — MAGNESIUM HYDROXIDE 400 MG/5ML PO SUSP
15.0000 mL | ORAL | Status: AC
  Administered 2021-10-05: 15 mL via ORAL
  Filled 2021-10-05: qty 30

## 2021-10-05 NOTE — TOC Progression Note (Addendum)
Transition of Care [facility]) - Progression Note    Patient Details  Name: Asael Futrell MRN: [id number] Date of Birth: 25-Oct-1965  [facility] of [facility] CM/SW Contact  Joanne Chars, LCSW Phone Number: 10/05/2021, 10:26 AM  Clinical Narrative:    MD to order capacity evaluation from psych.  CSW spoke with [place] at [facility].  She is waiting for a call back from pt sister regarding her willingness to become pt legal guardian and will call back with the results of that conversation.    1545: TC [place].  Sister declines to be pt's guardian and also says there are no other family members who could assume this responsibility.  Marsh Dolly will staff with her supervisor to discuss APS pursuing guardianship.  The decision to pursue guardianship has not yet been made.   Expected Discharge Plan: Home/Self Care Barriers to Discharge: Other (must enter comment), Continued Medical Work up (family declining to continue to care for pt)  Expected Discharge Plan and Services Expected Discharge Plan: Home/Self Care In-house Referral: Clinical Social Work   Post Acute Care Choice:  (TBD) Living arrangements for the past 2 months: Single Family Home                                       Social Determinants of Health (SDOH) Interventions    Readmission Risk Interventions Readmission Risk Prevention Plan 01/23/2020  Transportation Screening Complete  HRI or Home Care Consult Complete  Social Work Consult for [place] Planning/Counseling [place] Not Applicable  Medication Review Press photographer) (No Data)
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
## 2021-10-05 NOTE — Progress Notes (Signed)
Mobility Specialist Progress Note   10/05/21 1500  Mobility  Activity Ambulated independently in hallway  Level of Assistance Independent  Assistive Device None  Distance Ambulated (ft) 550 ft  Activity Response Tolerated well  $Mobility charge 1 Mobility   Received pt in bed having no complaints and agreeable to mobility. Asymptomatic throughout ambulation, returned back to bed w/ call bell in reach and all needs met.  [place]land Falling Mobility Specialist Phone Number [phone]

## 2021-10-05 NOTE — Plan of Care (Signed)

## 2021-10-06 DIAGNOSIS — E871 Hypo-osmolality and hyponatremia: Secondary | ICD-10-CM | POA: Diagnosis not present

## 2021-10-06 MED ORDER — PNEUMOCOCCAL VAC POLYVALENT 25 MCG/0.5ML IJ INJ
0.5000 mL | INJECTION | INTRAMUSCULAR | Status: DC
  Filled 2021-10-06: qty 0.5

## 2021-10-06 MED ORDER — SODIUM CHLORIDE 1 G PO TABS
2.0000 g | ORAL_TABLET | Freq: Two times a day (BID) | ORAL | Status: AC
  Administered 2021-10-06 – 2021-10-09 (×6): 2 g via ORAL
  Filled 2021-10-06 (×6): qty 2
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
## 2021-10-06 NOTE — Plan of Care (Signed)

## 2021-10-07 ENCOUNTER — Ambulatory Visit: Payer: Medicare PPO | Admitting: Family

## 2021-10-07 DIAGNOSIS — E871 Hypo-osmolality and hyponatremia: Secondary | ICD-10-CM | POA: Diagnosis not present

## 2021-10-07 LAB — SODIUM: Sodium: 127 mmol/L — ABNORMAL LOW (ref 135–145)

## 2021-10-07 NOTE — Progress Notes (Addendum)
Mobility Specialist Criteria Algorithm Info.    10/07/21 1039  Mobility  Activity Ambulated independently in hallway  Range of Motion/Exercises Active;All extremities  Level of Assistance Independent  Assistive Device None  Distance Ambulated (ft) 200 ft  Activity Response Tolerated well   Patient ambulated in hallway independently with steady gait. Tolerated well without complaint or incident. Was left lying supine in bed with all needs met.   10/07/2021 10:39 AM  [place] Shields [place], [place], [place]  [phone] Office: [phone]
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
## 2021-10-07 NOTE — Plan of Care (Signed)

## 2021-10-07 NOTE — TOC Progression Note (Addendum)
Transition of Care [facility]) - Progression Note    Patient Details  Name: Jim Dunn MRN: [id number] Date of Birth: [birth year]  Transition of Care [facility]) CM/SW Contact  Joanne Chars, LCSW Phone Number: 10/07/2021, 1:59 PM  Clinical Narrative:   CSW spoke with pt regarding disability and pt reports he is on disability and gets a check for $1500.  Pt was able to tell CSW that he knows he is waiting here for the hospital to "find him a group home."  Pt said he has talked with both DSS and his sister in the past few days.  CSW spoke with sister Virgilio Belling who confirmed that pt receives disability check of $1527.  They have tried to use that check to locate placement in the past but it was not enough to cover the costs. Pt mother is the payee.  They are aware that this money will need to be used to pay for his placement.  Pt has always been denied when he applied for medicaid.   Email from [employer] [facility]/Financial counseling that she is trying to talk with APS, who would usually handle the medicaid application if they are pursuing guardianship.     Lakita at [facility] updated.  She agreed to follow up with [facility].  TOC will continue to follow.     Expected Discharge Plan: Home/Self Care Barriers to Discharge: Other (must enter comment), Continued Medical Work up (family declining to continue to care for pt)  Expected Discharge Plan and Services Expected Discharge Plan: Home/Self Care In-house Referral: Clinical Social Work   Post Acute Care Choice:  (TBD) Living arrangements for the past 2 months: Single Family Home                                       Social Determinants of Health (SDOH) Interventions    Readmission Risk Interventions Readmission Risk Prevention Plan 01/23/2020  Transportation Screening Complete  HRI or Home Care Consult Complete  Social Work Consult for [place] Planning/Counseling [place] Not Applicable  Medication Review  Press photographer) (No Data)

## 2021-10-07 NOTE — Consult Note (Signed)
Brief Psychiatry Consult Note  The patient was last seen by the psychiatry service on 10/05/21. Interim documentation by primary team and nursing staff has been reviewed. At this time, patient is stably unable to care for self and there is no evidence of acute psychiatric disturbance requiring ongoing psychiatric consultation. Please see last consult note for full assessment. Final medication recommendations are as follows:  - continue current meds - if pt accepted into group home, is poor candidate for LAI given propensity to hyponatremia  We will sign off at this time. This has been communicated to the primary team. If issues arise in the future, don't hesitate to reconsult the Psychiatry Inpatient Consult Service.   Marquinn Meschke A Saraann Enneking

## 2021-10-08 DIAGNOSIS — E871 Hypo-osmolality and hyponatremia: Secondary | ICD-10-CM | POA: Diagnosis not present

## 2021-10-08 LAB — SODIUM: Sodium: 131 mmol/L — ABNORMAL LOW (ref 135–145)

## 2021-10-08 NOTE — Plan of Care (Signed)
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
## 2021-10-08 NOTE — Progress Notes (Signed)
MD arrived to assessed patient, patient not in room. Found walking back onto the unit from hallway. Patient educated regarding remaining on the unit while admitted by MD and RN. Patient verbalizes understanding. Unit doors closed per MD request.

## 2021-10-08 NOTE — Progress Notes (Signed)
Mobility Specialist Criteria Algorithm Info.   10/08/21 1400  Mobility  Activity Ambulated independently in hallway  Range of Motion/Exercises Active;All extremities  Level of Assistance Independent  Assistive Device None  Distance Ambulated (ft) 220 ft  Activity Response Tolerated well    Patient received in hallway ambulating independently. Tolerated duration of ambulation well without complaint or incident. Was left lying supine in bed with all needs met.   10/08/2021 4:07 PM  Jim Dunn, [place], [place]  YTWKM:[phone] Office: [phone]

## 2021-10-09 DIAGNOSIS — E871 Hypo-osmolality and hyponatremia: Secondary | ICD-10-CM | POA: Diagnosis not present

## 2021-10-09 NOTE — Plan of Care (Signed)
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
## 2021-10-09 NOTE — Progress Notes (Signed)
°  Mobility Specialist Criteria Algorithm Info.    10/09/21 1542  Mobility  Bed Position Semi-fowlers  Activity Ambulated independently in hallway  Range of Motion/Exercises Active;All extremities  Level of Assistance Independent  Distance Ambulated (ft) 1100 ft  Activity Response Tolerated well   Patient ambulated in hallway independently with steady gait. Tolerated ambulation well without complaint or incident.  10/09/2021 4:41 PM  [place] Nell Schrack, CMS, BS EXP [facility]  [phone] Office: [phone]

## 2021-10-10 DIAGNOSIS — E871 Hypo-osmolality and hyponatremia: Secondary | ICD-10-CM | POA: Diagnosis not present

## 2021-10-10 LAB — SODIUM: Sodium: 126 mmol/L — ABNORMAL LOW (ref 135–145)

## 2021-10-10 NOTE — Progress Notes (Signed)
Mobility Specialist Progress Note    10/10/21 1355  Mobility  Activity Ambulated independently in hallway  Level of Assistance Standby assist, set-up cues, supervision of patient - no hands on  Assistive Device None  Distance Ambulated (ft) 550 ft  Activity Response Tolerated fair  $Mobility charge 1 Mobility   Pt received in bed and agreeable. C/o some lightheadedness on walk. Took x1 seated rest break and pt became emotional about his life. Returned to bed with call bell in reach and bed alarm on.   [facility] Mobility Specialist  M.S. 5N: [id number]
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
## 2021-10-10 NOTE — Progress Notes (Signed)
I attended to pt bathroom call light. The bathroom was full of cigarette smoke. I asked pt about smoking and he said he smoked. I asked for the remaining cigarettes but denied having none. I also asked for the lighter and reached his pocket for it. The lighter revealed half-pack box. I took both the lighter and the remaining cigarette away and showed it to the CN before discarding them.

## 2021-10-10 NOTE — TOC Progression Note (Signed)
Transition of Care [facility]) - Progression Note    Patient Details  Name: Jim Dunn MRN: [id number] Date of Birth: 24-Apr-1966  Transition of Care [facility]) CM/SW Contact  Joanne Chars, LCSW Phone Number: 10/10/2021, 10:14 AM  Clinical Narrative:   Medicaid documents received from [place], pt signed and they were sent back to [place].      Expected Discharge Plan: Home/Self Care Barriers to Discharge: Other (must enter comment), Continued Medical Work up (family declining to continue to care for pt)  Expected Discharge Plan and Services Expected Discharge Plan: Home/Self Care In-house Referral: Clinical Social Work   Post Acute Care Choice:  (TBD) Living arrangements for the past 2 months: Single Family Home                                       Social Determinants of Health (SDOH) Interventions    Readmission Risk Interventions Readmission Risk Prevention Plan 01/23/2020  Transportation Screening Complete  HRI or Home Care Consult Complete  Social Work Consult for [place] Planning/Counseling [place] Not Applicable  Medication Review Press photographer) (No Data)

## 2021-10-11 DIAGNOSIS — E871 Hypo-osmolality and hyponatremia: Secondary | ICD-10-CM | POA: Diagnosis not present

## 2021-10-11 LAB — SODIUM: Sodium: 129 mmol/L — ABNORMAL LOW (ref 135–145)

## 2021-10-11 NOTE — Plan of Care (Signed)
°  Problem: Health Behavior/Discharge Planning: Goal: Ability to manage health-related needs will improve Outcome: Progressing   Problem: Education: Goal: Knowledge of General Education information will improve Description: Including pain rating scale, medication(s)/side effects and non-pharmacologic comfort measures 10/11/2021 1549 by Gaspar Cola, RN Outcome: Progressing

## 2021-10-11 NOTE — Progress Notes (Signed)
Mobility Specialist: Progress Note   10/11/21 1728  Mobility  Activity Ambulated independently in hallway  Level of Assistance Independent  Assistive Device None  Distance Ambulated (ft) 550 ft  Activity Response Tolerated well  $Mobility charge 1 Mobility   Received pt in bed having no complaints and agreeable to mobility. Asymptomatic throughout ambulation, returned back to bed w/ call bell in reach and all needs met.  [facility] Jalesa Thien Mobility Specialist Mobility Specialist 5 North: [phone] Mobility Specialist 6 North: [phone]

## 2021-10-11 NOTE — Progress Notes (Signed)
Sister [name] in facility and updated her regarding pt's current health status/behavior. Sister reinforced to pt to refrained from smoking in room and leaving off the unit. And to abide the rules and policy of the facility.
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
## 2021-10-12 DIAGNOSIS — E871 Hypo-osmolality and hyponatremia: Secondary | ICD-10-CM | POA: Diagnosis not present

## 2021-10-12 LAB — SODIUM: Sodium: 129 mmol/L — ABNORMAL LOW (ref 135–145)

## 2021-10-12 NOTE — Progress Notes (Signed)
Mobility Specialist Progress Note    10/12/21 1335  Mobility  Activity Ambulated independently in hallway  Level of Assistance Standby assist, set-up cues, supervision of patient - no hands on  Assistive Device None  Distance Ambulated (ft) 550 ft  Activity Response Tolerated fair  $Mobility charge 1 Mobility   Pt received in bed and agreeable. No complaints on walk. Returned to bed with call bell in reach.    [facility] Mobility Specialist  M.S. 5N: [phone]

## 2021-10-12 NOTE — TOC Progression Note (Signed)
Transition of Care [facility]) - Progression Note    Patient Details  Name: Tayvon Culley MRN: [id number] Date of Birth: 04/04/1966  Transition of Care [facility]) CM/SW Contact  Lorri Frederick, LCSW Phone Number: 10/12/2021, 11:43 AM  Clinical Narrative:   CSW received text from [facility].  She is expecting a decision on whether they will pursue guardianship this week.     Expected Discharge Plan: Home/Self Care Barriers to Discharge: Other (must enter comment), Continued Medical Work up (family declining to continue to care for pt)  Expected Discharge Plan and Services Expected Discharge Plan: Home/Self Care In-house Referral: Clinical Social Work   Post Acute Care Choice:  (TBD) Living arrangements for the past 2 months: Single Family Home                                       Social Determinants of Health (SDOH) Interventions    Readmission Risk Interventions Readmission Risk Prevention Plan 01/23/2020  Transportation Screening Complete  HRI or Home Care Consult Complete  Social Work Consult for Recovery Care Planning/Counseling Complete  Palliative Care Screening Not Applicable  Medication Review [employer]) (No Data)

## 2021-10-12 NOTE — Progress Notes (Signed)
PROGRESS NOTE  Jim Dunn [id number] DOB: 09-Mar-1966 DOA: 09/24/2021 PCP: Pcp, No  Brief History   Patient is a 56 year old male with history of schizophrenia, chronic hyponatremia, seizure disorder, tobacco use, systolic congestive heart failure, COPD who was brought here on 09/24/2021 to ED by his family after they were unable to take care of him.  He was drinking a lot of water and trying to burn things.  Previously he was staying  at group homes and was kicked out.  Neurology and psychiatry were consulted after admission.  Hospital course remarkable for acute on chronic hyponatremia.  Prolonged hospitalization.  TOC following.  Pending guardianship and placement.  Consultants  Psychiatry Neurology  Procedures    Antibiotics   Anti-infectives (From admission, onward)    None      Interval History/Subjective    Objective   Vitals:  Vitals:   10/12/21 0757 10/12/21 1541  BP: 130/78 114/64  Pulse: 74 87  Resp: 16   Temp: 97.7 F (36.5 C) 98.1 F (36.7 C)  SpO2: 99% 99%    Exam:  Constitutional:  The patient is awake, alert, and oriented x 3. No acute distress. Respiratory:  No increased work of breathing. No wheezes, rales, or rhonchi No tactile fremitus Cardiovascular:  Regular rate and rhythm No murmurs, ectopy, or gallups. No lateral PMI. No thrills. Abdomen:  Abdomen is soft, non-tender, non-distended No hernias, masses, or organomegaly Normoactive bowel sounds.  Musculoskeletal:  No cyanosis, clubbing, or edema Skin:  No rashes, lesions, ulcers palpation of skin: no induration or nodules Neurologic:  CN 2-12 intact Sensation all 4 extremities intact Psychiatric:  Mental status Mood, affect appropriate Orientation to person, place, time  judgment and insight appear intact  I have personally reviewed the following:   Today's Data  Vitals  Lab Data  BMP CBC  Micro Data    Imaging    Cardiology Data    Other Data     Scheduled Meds:  atorvastatin  40 mg Oral Daily   digoxin  0.25 mg Oral Daily   divalproex  500 mg Oral BID   enoxaparin (LOVENOX) injection  40 mg Subcutaneous Q24H   furosemide  20 mg Oral Daily   melatonin  3 mg Oral QHS   mirtazapine  15 mg Oral QHS   nicotine  14 mg Transdermal Daily   paliperidone  3 mg Oral Daily   pneumococcal 23 valent vaccine  0.5 mL Intramuscular Tomorrow-1000   polyethylene glycol  17 g Oral Daily   sacubitril-valsartan  1 tablet Oral BID   senna-docusate  2 tablet Oral BID   Continuous Infusions:  Principal Problem:   Chronic hyponatremia Active Problems:   Chronic systolic CHF (congestive heart failure) (HCC)   Mixed hyperlipidemia   Seizure disorder (HCC)   Paranoid schizophrenia with hallucinations   Memory loss   LOS: 13 days   A & P  Paranoid schizophrenia with hallucinations -hx of noncompliance and various physicians filling medication Having hallucinations that can frighten him and make him sad Psychiatry following and will manage medication, appreciate  Will need placement, SW consult   Chronic systolic CHF (congestive heart failure) ([place]) Appears euvolemic Continue home meds of entresto, digoxin and waiting on MAR to see if taking lasix. Digoxin level subtherapeutic  Strict I/O and daily weights Fluid restriction for chronic hyponatremia Last echo: 5/21 with EF of 25-30% with global hypokinesis, RV overload and indeterminate diastolic dysfunction. Does not appear that he has followed up with cardiology. Gets  meds from ED.  Will recheck echo since being admitted and will hopefully get him set up with outpatient cardiology  Mixed hyperlipidemia Continue lipitor, do not think he has been taking   Seizure disorder ([place]) Receiving depakote 500mg  BID. Unsure if seizures were due to hyponatremia vs. Alcohol or true seizure disorder. 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  Will ask neurology to see to make recommendations from their point of view Checking ammonia, TSH and B12.  Ethanol wnl, UDS pending History of heavy drinking in past, check thiamine level, start thiamine  Will check brain MRI with and without contrast, can't not see that this has been done.   Chronic hyponatremia 56 year old with acute change in normal mental status with increased polydipsia presenting with hyponatremia.  -baseline 120-126 -he appears to be at his baseline, doubt that his increasing memory loss is due to this.  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 Urine studies pending from ED.stop IVF, does not appear dehydrated or volume overloaded from his CHF -fluid restriction  -trend sodium -? Sodium tablets, sister thinks he took these at some point, but not currently taking  -watch anti-psychotic medication that could worsen sodium    I have seen and examined this patient myself. I have spent 34 minutes in his evaluation and care.  DVT prophylaxis: Lovenox Code Status: Full Code Family Communication: None available Disposition Plan: Awaiting the appointment of guardianship and placement. Patient remains an elopement risk.   [name], DO [facility] Direct contact: see [email or web address]  7PM-7AM contact night coverage as above 10/12/2021, 6:57 PM  LOS: 13 days

## 2021-10-13 DIAGNOSIS — E871 Hypo-osmolality and hyponatremia: Secondary | ICD-10-CM | POA: Diagnosis not present

## 2021-10-13 LAB — BASIC METABOLIC PANEL
Anion gap: 8 (ref 5–15)
BUN: 9 mg/dL (ref 6–20)
CO2: 25 mmol/L (ref 22–32)
Calcium: 8.8 mg/dL — ABNORMAL LOW (ref 8.9–10.3)
Chloride: 95 mmol/L — ABNORMAL LOW (ref 98–111)
Creatinine, Ser: 0.64 mg/dL (ref 0.61–1.24)
GFR, Estimated: >60 mL/min (ref >60)
Glucose, Bld: 89 mg/dL (ref 70–99)
Potassium: 4.3 mmol/L (ref 3.5–5.1)
Sodium: 128 mmol/L — ABNORMAL LOW (ref 135–145)

## 2021-10-13 LAB — CBC WITH DIFFERENTIAL/PLATELET
Abs Immature Granulocytes: 0.1 10*3/uL — ABNORMAL HIGH (ref 0.00–0.07)
Basophils Absolute: 0 10*3/uL (ref 0.0–0.1)
Basophils Relative: 1 %
Eosinophils Absolute: 0.2 10*3/uL (ref 0.0–0.5)
Eosinophils Relative: 2 %
HCT: 35.9 % — ABNORMAL LOW (ref 39.0–52.0)
Hemoglobin: 12 g/dL — ABNORMAL LOW (ref 13.0–17.0)
Immature Granulocytes: 1 %
Lymphocytes Relative: 48 %
Lymphs Abs: 4 10*3/uL (ref 0.7–4.0)
MCH: 22.8 pg — ABNORMAL LOW (ref 26.0–34.0)
MCHC: 33.4 g/dL (ref 30.0–36.0)
MCV: 68.1 fL — ABNORMAL LOW (ref 80.0–100.0)
Monocytes Absolute: 1 10*3/uL (ref 0.1–1.0)
Monocytes Relative: 11 %
Neutro Abs: 3.2 10*3/uL (ref 1.7–7.7)
Neutrophils Relative %: 37 %
Platelets: 166 10*3/uL (ref 150–400)
RBC: 5.27 MIL/uL (ref 4.22–5.81)
RDW: 15.7 % — ABNORMAL HIGH (ref 11.5–15.5)
WBC: 8.5 10*3/uL (ref 4.0–10.5)
nRBC: 0 % (ref 0.0–0.2)

## 2021-10-13 MED ORDER — QUETIAPINE FUMARATE 50 MG PO TABS
50.0000 mg | ORAL_TABLET | Freq: Every day | ORAL | Status: DC
  Administered 2021-10-13 – 2022-04-06 (×176): 50 mg via ORAL
  Filled 2021-10-13: qty 2
  Filled 2021-10-13 (×5): qty 1
  Filled 2021-10-13: qty 2
  Filled 2021-10-13 (×2): qty 1
  Filled 2021-10-13: qty 2
  Filled 2021-10-13 (×14): qty 1
  Filled 2021-10-13: qty 2
  Filled 2021-10-13 (×6): qty 1
  Filled 2021-10-13: qty 2
  Filled 2021-10-13 (×7): qty 1
  Filled 2021-10-13: qty 2
  Filled 2021-10-13 (×4): qty 1
  Filled 2021-10-13: qty 2
  Filled 2021-10-13 (×4): qty 1
  Filled 2021-10-13: qty 2
  Filled 2021-10-13 (×20): qty 1
  Filled 2021-10-13: qty 2
  Filled 2021-10-13 (×2): qty 1
  Filled 2021-10-13: qty 2
  Filled 2021-10-13 (×14): qty 1
  Filled 2021-10-13: qty 2
  Filled 2021-10-13 (×5): qty 1
  Filled 2021-10-13: qty 2
  Filled 2021-10-13: qty 1
  Filled 2021-10-13: qty 2
  Filled 2021-10-13 (×21): qty 1
  Filled 2021-10-13: qty 2
  Filled 2021-10-13 (×12): qty 1
  Filled 2021-10-13: qty 2
  Filled 2021-10-13 (×12): qty 1
  Filled 2021-10-13: qty 2
  Filled 2021-10-13 (×27): qty 1
  Filled 2021-10-13 (×2): qty 2
  Filled 2021-10-13 (×3): qty 1
  Filled 2021-10-13: qty 2

## 2021-10-13 NOTE — Progress Notes (Signed)
Mobility Specialist Progress Note    10/13/21 1604  Mobility  Activity Ambulated independently in hallway  Level of Assistance Standby assist, set-up cues, supervision of patient - no hands on  Assistive Device None  Distance Ambulated (ft) 1000 ft  Activity Response Tolerated fair  $Mobility charge 1 Mobility   Pt received in doorway and agreeable. No complaints on walk. Returned to bed with call bell in reach.    [facility] Mobility Specialist  M.S. 5N: [phone]

## 2021-10-13 NOTE — Progress Notes (Signed)
Went to check in on pt. he was not in hi room, was found outside of main lobby smokingcigarette and drinking coffee, he complain of been stuck in room and need something to make him relax, pt was escorted be to room with no problem. Doc notified

## 2021-10-13 NOTE — Progress Notes (Signed)
PROGRESS NOTE  Jim Dunn YOV:[id number] DOB: 05/28/1966 DOA: 09/24/2021 PCP: Pcp, No  Brief History   Patient is a 56 year old male with history of schizophrenia, chronic hyponatremia, seizure disorder, tobacco use, systolic congestive heart failure, COPD who was brought here on 09/24/2021 to ED by his family after they were unable to take care of him.  He was drinking a lot of water and trying to burn things.  Previously he was staying  at group homes and was kicked out.  Neurology and psychiatry were consulted after admission.  Hospital course remarkable for acute on chronic hyponatremia.  Prolonged hospitalization.  TOC following.  Pending guardianship and placement.  The patient eloped again today. He was found smoking outside with a cup of coffee.  He has asked for something to help quiet down his mind.  Consultants  Psychiatry Neurology  Procedures    Antibiotics   Anti-infectives (From admission, onward)    None      Interval History/Subjective    Objective   Vitals:  Vitals:   10/13/21 1300 10/13/21 1514  BP:  (!) 161/93  Pulse: 83 77  Resp:  17  Temp:  98 F (36.7 C)  SpO2:  99%    Exam:  Constitutional:  The patient is awake, alert, and oriented x 3. No acute distress. Respiratory:  No increased work of breathing. No wheezes, rales, or rhonchi No tactile fremitus Cardiovascular:  Regular rate and rhythm No murmurs, ectopy, or gallups. No lateral PMI. No thrills. Abdomen:  Abdomen is soft, non-tender, non-distended No hernias, masses, or organomegaly Normoactive bowel sounds.  Musculoskeletal:  No cyanosis, clubbing, or edema Skin:  No rashes, lesions, ulcers palpation of skin: no induration or nodules Neurologic:  CN 2-12 intact Sensation all 4 extremities intact Psychiatric:  Mental status Mood, affect appropriate Orientation to person, place, time  judgment and insight appear intact  I have personally reviewed the following:    Today's Data  Vitals  Lab Data  BMP CBC  Micro Data    Imaging    Cardiology Data    Other Data    Scheduled Meds:  atorvastatin  40 mg Oral Daily   digoxin  0.25 mg Oral Daily   divalproex  500 mg Oral BID   enoxaparin (LOVENOX) injection  40 mg Subcutaneous Q24H   furosemide  20 mg Oral Daily   melatonin  3 mg Oral QHS   mirtazapine  15 mg Oral QHS   nicotine  14 mg Transdermal Daily   paliperidone  3 mg Oral Daily   pneumococcal 23 valent vaccine  0.5 mL Intramuscular Tomorrow-1000   polyethylene glycol  17 g Oral Daily   QUEtiapine  50 mg Oral QHS   sacubitril-valsartan  1 tablet Oral BID   senna-docusate  2 tablet Oral BID    Principal Problem:   Chronic hyponatremia Active Problems:   Chronic systolic CHF (congestive heart failure) (HCC)   Mixed hyperlipidemia   Seizure disorder (HCC)   Paranoid schizophrenia with hallucinations   Memory loss   LOS: 14 days   A & P  Paranoid schizophrenia with hallucinations -hx of noncompliance and various physicians filling medication Having hallucinations that can frighten him and make him sad Psychiatry following and will manage medication, appreciate  The patient is receiving Invega 3 mg daily. I have added night time Seroquel as the patient has requested something to "quiet down his mind." Will need guardianship and placement.  Chronic systolic CHF (congestive heart failure) (HCC) Appears euvolemic  Continue home meds of entresto, digoxin and waiting on MAR to see if taking lasix. Digoxin level subtherapeutic  Strict I/O and daily weights Fluid restriction for chronic hyponatremia Last echo: 5/21 with EF of 25-30% with global hypokinesis, RV overload and indeterminate diastolic dysfunction. Does not appear that he has followed up with cardiology. Gets meds from ED.  Will recheck echo since being admitted and will hopefully get him set up with outpatient cardiology  Mixed hyperlipidemia Continue lipitor,  do not think he has been taking   Seizure disorder (HCC) Receiving depakote 500mg  BID. Unsure if seizures were due to hyponatremia vs. Alcohol or true seizure disorder. 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  Will ask neurology to see to make recommendations from their point of view Checking ammonia, TSH and B12.  Ethanol wnl, UDS pending History of heavy drinking in past, check thiamine level, start thiamine  Will check brain MRI with and without contrast, can't not see that this has been done.   Chronic hyponatremia 56 year old with acute change in normal mental status with increased polydipsia presenting with hyponatremia.  -baseline 120-126 -he appears to be at his baseline, doubt that his increasing memory loss is due to this.  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 Urine studies pending from ED.stop IVF, does not appear dehydrated or volume overloaded from his CHF -fluid restriction  -sodium was 128 today. -? Sodium tablets, sister thinks he took these at some point, but not currently taking  -watch anti-psychotic medication that could worsen sodium   COPD (chronic obstructive pulmonary disease) (HCC) Respiratory status is stable. Continue to monitor.   I have seen and examined this patient myself. I have spent 36 minutes in his evaluation and care.  DVT prophylaxis: Lovenox Code Status: Full Code Family Communication: None available Disposition Plan: Awaiting the appointment of guardianship and placement. Patient remains an elopement risk.   Jim Duncombe, DO [facility] Direct contact: see [email or web address]  7PM-7AM contact night coverage as above 10/13/2021, 6:15 PM  LOS: 13 days

## 2021-10-13 NOTE — Progress Notes (Signed)
Mobility Specialist Progress Note    10/13/21 1104  Mobility  Bed Position Chair  Activity Ambulated independently in hallway  Level of Assistance Standby assist, set-up cues, supervision of patient - no hands on  Assistive Device None  Distance Ambulated (ft) 1000 ft  Activity Response Tolerated fair  $Mobility charge 1 Mobility   Pt received in bed and agreeable. No complaints on walk. Returned to chair with call bell in reach.    [facility] Mobility Specialist  M.S. 5N: [phone]

## 2021-10-13 NOTE — Assessment & Plan Note (Addendum)
Compensated Given Pneumovax immunization this admission

## 2021-10-14 DIAGNOSIS — E871 Hypo-osmolality and hyponatremia: Secondary | ICD-10-CM | POA: Diagnosis not present

## 2021-10-14 LAB — BASIC METABOLIC PANEL
Anion gap: 13 (ref 5–15)
BUN: 15 mg/dL (ref 6–20)
CO2: 19 mmol/L — ABNORMAL LOW (ref 22–32)
Calcium: 8.6 mg/dL — ABNORMAL LOW (ref 8.9–10.3)
Chloride: 94 mmol/L — ABNORMAL LOW (ref 98–111)
Creatinine, Ser: 0.58 mg/dL — ABNORMAL LOW (ref 0.61–1.24)
GFR, Estimated: >60 mL/min (ref >60)
Glucose, Bld: 93 mg/dL (ref 70–99)
Potassium: 4 mmol/L (ref 3.5–5.1)
Sodium: 126 mmol/L — ABNORMAL LOW (ref 135–145)

## 2021-10-14 LAB — DIGOXIN LEVEL: Digoxin Level: 0.2 ng/mL — ABNORMAL LOW (ref 0.8–2.0)

## 2021-10-14 MED ORDER — SODIUM CHLORIDE 1 G PO TABS
1.0000 g | ORAL_TABLET | Freq: Two times a day (BID) | ORAL | Status: DC
  Administered 2021-10-14 – 2021-10-21 (×15): 1 g via ORAL
  Filled 2021-10-14 (×16): qty 1

## 2021-10-14 NOTE — Plan of Care (Signed)
°  Problem: Education: Goal: Knowledge of General Education information will improve Description: Including pain rating scale, medication(s)/side effects and non-pharmacologic comfort measures Outcome: Progressing   Problem: Health Behavior/Discharge Planning: Goal: Ability to manage health-related needs will improve Outcome: Progressing   Problem: Clinical Measurements: Goal: Ability to maintain clinical measurements within normal limits will improve Outcome: Progressing   Problem: Education: Goal: Knowledge of General Education information will improve Description: Including pain rating scale, medication(s)/side effects and non-pharmacologic comfort measures Outcome: Progressing   Problem: Health Behavior/Discharge Planning: Goal: Ability to manage health-related needs will improve Outcome: Progressing   Problem: Clinical Measurements: Goal: Ability to maintain clinical measurements within normal limits will improve Outcome: Progressing Goal: Will remain free from infection Outcome: Progressing Goal: Diagnostic test results will improve Outcome: Progressing Goal: Respiratory complications will improve Outcome: Progressing Goal: Cardiovascular complication will be avoided Outcome: Progressing   Problem: Activity: Goal: Risk for activity intolerance will decrease Outcome: Progressing

## 2021-10-14 NOTE — Progress Notes (Signed)
PROGRESS NOTE  Jim Dunn ZOX:[id number] DOB: 1966/04/24 DOA: 09/24/2021 PCP: Pcp, No  Brief History   Patient is a 56 year old male with history of schizophrenia, chronic hyponatremia, seizure disorder, tobacco use, systolic congestive heart failure, COPD who was brought here on 09/24/2021 to ED by his family after they were unable to take care of him.  He was drinking a lot of water and trying to burn things.  Previously he was staying  at group homes and was kicked out.  Neurology and psychiatry were consulted after admission.  Hospital course remarkable for acute on chronic hyponatremia.  Prolonged hospitalization.  TOC following.  Pending guardianship and placement.  The patient eloped again today. He was found smoking outside with a cup of coffee.  On 09/12/2021 the patient asked for something to help quiet down his mind. I added seroquel 50 mg qhs. Today he states that it has helped.  Consultants  Psychiatry Neurology  Procedures    Antibiotics   Anti-infectives (From admission, onward)    None      Interval History/Subjective    Objective   Vitals:  Vitals:   10/14/21 0815 10/14/21 1318  BP: (!) 145/89 127/76  Pulse: 76 77  Resp: 18 18  Temp: 98 F (36.7 C) 98.2 F (36.8 C)  SpO2: 100% 97%    Exam:  Constitutional:  The patient is awake, alert, and oriented x 3. No acute distress. Respiratory:  No increased work of breathing. No wheezes, rales, or rhonchi No tactile fremitus Cardiovascular:  Regular rate and rhythm No murmurs, ectopy, or gallups. No lateral PMI. No thrills. Abdomen:  Abdomen is soft, non-tender, non-distended No hernias, masses, or organomegaly Normoactive bowel sounds.  Musculoskeletal:  No cyanosis, clubbing, or edema Skin:  No rashes, lesions, ulcers palpation of skin: no induration or nodules Neurologic:  CN 2-12 intact Sensation all 4 extremities intact Psychiatric:  Mental status Mood, affect appropriate Orientation to  person, place, time  judgment and insight appear intact  I have personally reviewed the following:   Today's Data  Vitals  Lab Data  BMP CBC  Micro Data    Imaging    Cardiology Data    Other Data    Scheduled Meds:  atorvastatin  40 mg Oral Daily   digoxin  0.25 mg Oral Daily   divalproex  500 mg Oral BID   enoxaparin (LOVENOX) injection  40 mg Subcutaneous Q24H   furosemide  20 mg Oral Daily   melatonin  3 mg Oral QHS   mirtazapine  15 mg Oral QHS   nicotine  14 mg Transdermal Daily   paliperidone  3 mg Oral Daily   pneumococcal 23 valent vaccine  0.5 mL Intramuscular Tomorrow-1000   polyethylene glycol  17 g Oral Daily   QUEtiapine  50 mg Oral QHS   sacubitril-valsartan  1 tablet Oral BID   senna-docusate  2 tablet Oral BID   sodium chloride  1 g Oral BID WC    Principal Problem:   Chronic hyponatremia Active Problems:   Chronic systolic CHF (congestive heart failure) (HCC)   Mixed hyperlipidemia   Seizure disorder (HCC)   Paranoid schizophrenia with hallucinations   Memory loss   LOS: 15 days   A & P  Paranoid schizophrenia with hallucinations -hx of noncompliance and various physicians filling medication Having hallucinations that can frighten him and make him sad Psychiatry following and will manage medication, appreciate  The patient is receiving Invega 3 mg daily. I have added night  time Seroquel as the patient has requested something to "quiet down his mind." The patient today states that it has helped. Will need guardianship and placement.   Chronic systolic CHF (congestive heart failure) (HCC) Appears euvolemic Continue home meds of entresto, digoxin and lasix 20 mg daily. Digoxin level subtherapeutic. Will recheck. Strict I/O and daily weights Fluid restriction for chronic hyponatremia Last echo: 5/21 with EF of 25-30% with global hypokinesis, RV overload and indeterminate diastolic dysfunction. Does not appear that he has followed up with  cardiology. Gets meds from ED.  Echocardiogram performed on 09/26/2021 EF of 30-35% with moderately decreased systolic function and global hypokinesis. RV size and systolic function is normal.   Mixed hyperlipidemia Continue lipitor, do not think he has been taking   Seizure disorder (HCC) Receiving depakote 500mg  BID. Unsure if seizures were due to hyponatremia vs. Alcohol or true seizure disorder. 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  Will ask neurology to see to make recommendations from their point of view Checking ammonia, TSH and B12.  Ethanol wnl, UDS pending History of heavy drinking in past, check thiamine level, start thiamine  Will check brain MRI with and without contrast, can't not see that this has been done.   Chronic hyponatremia 56 year old with acute change in normal mental status with increased polydipsia presenting with hyponatremia.  -baseline 120-126 -he appears to be at his baseline, doubt that his increasing memory loss is due to this.  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 Urine studies pending from ED.stop IVF, does not appear dehydrated or volume overloaded from his CHF -1500 cc fluid restriction  -sodium was 124 today. -Sodium tablets restarted at 1 gm PO bid.   COPD (chronic obstructive pulmonary disease) (HCC) Respiratory status is stable. Continue to monitor.   I have seen and examined this patient myself. I have spent 36 minutes in his evaluation and care.  DVT prophylaxis: Lovenox Code Status: Full Code Family Communication: None available Disposition Plan: Awaiting the appointment of guardianship and placement. Patient remains an elopement risk.   Iyanni Hepp, DO [facility] Direct contact: see [email or web address]  7PM-7AM contact night coverage as above 10/14/2021, 5:14 PM  LOS: 13 days

## 2021-10-14 NOTE — Progress Notes (Signed)
The patient is injury-free, afebrile, alert, and oriented. Vital signs were within the baseline during this shift. Pt denies chest pain, SOB, nausea, vomiting, dizziness, signs or symptoms of bleeding or infection during this shift. We will continue to monitor and work toward achieving the care plan goals

## 2021-10-15 DIAGNOSIS — E871 Hypo-osmolality and hyponatremia: Secondary | ICD-10-CM | POA: Diagnosis not present

## 2021-10-15 LAB — BASIC METABOLIC PANEL
Anion gap: 8 (ref 5–15)
BUN: 13 mg/dL (ref 6–20)
CO2: 25 mmol/L (ref 22–32)
Calcium: 8.9 mg/dL (ref 8.9–10.3)
Chloride: 96 mmol/L — ABNORMAL LOW (ref 98–111)
Creatinine, Ser: 0.69 mg/dL (ref 0.61–1.24)
GFR, Estimated: >60 mL/min (ref >60)
Glucose, Bld: 102 mg/dL — ABNORMAL HIGH (ref 70–99)
Potassium: 4.4 mmol/L (ref 3.5–5.1)
Sodium: 129 mmol/L — ABNORMAL LOW (ref 135–145)

## 2021-10-15 MED ORDER — ALPRAZOLAM 0.5 MG PO TABS
0.5000 mg | ORAL_TABLET | Freq: Three times a day (TID) | ORAL | Status: DC | PRN
  Administered 2021-10-15 – 2021-10-30 (×29): 0.5 mg via ORAL
  Filled 2021-10-15 (×29): qty 1

## 2021-10-15 NOTE — Progress Notes (Signed)
PROGRESS NOTE  Jim Dunn BWL:[id number] DOB: 15-Oct-1965 DOA: 09/24/2021 PCP: Pcp, No  Brief History   Patient is a 56 year old male with history of schizophrenia, chronic hyponatremia, seizure disorder, tobacco use, systolic congestive heart failure, COPD who was brought here on 09/24/2021 to ED by his family after they were unable to take care of him.  He was drinking a lot of water and trying to burn things.  Previously he was staying  at group homes and was kicked out.  Neurology and psychiatry were consulted after admission.  Hospital course remarkable for acute on chronic hyponatremia.  Prolonged hospitalization.  TOC following.  Pending guardianship and placement.  The patient eloped again today. He was found smoking outside with a cup of coffee.  On 09/12/2021 the patient asked for something to help quiet down his mind. I added seroquel 50 mg qhs. Today he states that it has helped.  Consultants  Psychiatry Neurology  Procedures    Antibiotics   Anti-infectives (From admission, onward)    None      Interval History/Subjective    Objective   Vitals:  Vitals:   10/15/21 0900 10/15/21 1014  BP:  (!) 146/93  Pulse:  80  Resp:  18  Temp: 98 F (36.7 C) 98 F (36.7 C)  SpO2:  100%    Exam:  Constitutional:  The patient is awake, alert, and oriented x 3. No acute distress. Respiratory:  No increased work of breathing. No wheezes, rales, or rhonchi No tactile fremitus Cardiovascular:  Regular rate and rhythm No murmurs, ectopy, or gallups. No lateral PMI. No thrills. Abdomen:  Abdomen is soft, non-tender, non-distended No hernias, masses, or organomegaly Normoactive bowel sounds.  Musculoskeletal:  No cyanosis, clubbing, or edema Skin:  No rashes, lesions, ulcers palpation of skin: no induration or nodules Neurologic:  CN 2-12 intact Sensation all 4 extremities intact Psychiatric:  Mental status Mood, affect appropriate Orientation to person, place,  time  judgment and insight appear intact  I have personally reviewed the following:   Today's Data  Vitals  Lab Data  BMP CBC  Micro Data    Imaging    Cardiology Data    Other Data    Scheduled Meds:  atorvastatin  40 mg Oral Daily   digoxin  0.25 mg Oral Daily   divalproex  500 mg Oral BID   enoxaparin (LOVENOX) injection  40 mg Subcutaneous Q24H   furosemide  20 mg Oral Daily   melatonin  3 mg Oral QHS   mirtazapine  15 mg Oral QHS   nicotine  14 mg Transdermal Daily   paliperidone  3 mg Oral Daily   pneumococcal 23 valent vaccine  0.5 mL Intramuscular Tomorrow-1000   polyethylene glycol  17 g Oral Daily   QUEtiapine  50 mg Oral QHS   sacubitril-valsartan  1 tablet Oral BID   senna-docusate  2 tablet Oral BID   sodium chloride  1 g Oral BID WC    Principal Problem:   Chronic hyponatremia Active Problems:   Chronic systolic CHF (congestive heart failure) (HCC)   Mixed hyperlipidemia   Seizure disorder (HCC)   Paranoid schizophrenia with hallucinations   Memory loss   LOS: 16 days   A & P  Paranoid schizophrenia with hallucinations -hx of noncompliance and various physicians filling medication Having hallucinations that can frighten him and make him sad Psychiatry following and will manage medication, appreciate  The patient is receiving Invega 3 mg daily. I have added night  time Seroquel as the patient has requested something to "quiet down his mind." The patient today states that it has helped. Will need guardianship and placement.   Chronic systolic CHF (congestive heart failure) (HCC) Appears euvolemic Continue home meds of entresto, digoxin and lasix 20 mg daily. Digoxin level subtherapeutic. Will recheck. Strict I/O and daily weights Fluid restriction for chronic hyponatremia Last echo: 5/21 with EF of 25-30% with global hypokinesis, RV overload and indeterminate diastolic dysfunction. Does not appear that he has followed up with cardiology.  Gets meds from ED.  Echocardiogram performed on 09/26/2021 EF of 30-35% with moderately decreased systolic function and global hypokinesis. RV size and systolic function is normal.   Mixed hyperlipidemia Continue lipitor, do not think he has been taking   Seizure disorder (HCC) Receiving depakote 500mg  BID. Unsure if seizures were due to hyponatremia vs. Alcohol or true seizure disorder. 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  Will ask neurology to see to make recommendations from their point of view Checking ammonia, TSH and B12.  Ethanol wnl, UDS pending History of heavy drinking in past, check thiamine level, start thiamine  Will check brain MRI with and without contrast, can't not see that this has been done.   Chronic hyponatremia 56 year old with acute change in normal mental status with increased polydipsia presenting with hyponatremia.  -baseline 120-126 -he appears to be at his baseline, doubt that his increasing memory loss is due to this.  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 Urine studies pending from ED.stop IVF, does not appear dehydrated or volume overloaded from his CHF -1500 cc fluid restriction  -sodium was 124 today. -Sodium tablets restarted at 1 gm PO bid.   COPD (chronic obstructive pulmonary disease) (HCC) Respiratory status is stable. Continue to monitor.   I have seen and examined this patient myself. I have spent 32 minutes in his evaluation and care.  DVT prophylaxis: Lovenox Code Status: Full Code Family Communication: None available Disposition Plan: Awaiting the appointment of guardianship and placement. Patient remains an elopement risk.   Laverna Dossett, DO [facility] Direct contact:  see [email or web address]  7PM-7AM contact night coverage as above 10/15/2021, 3:31 PM  LOS: 13 days

## 2021-10-16 DIAGNOSIS — E871 Hypo-osmolality and hyponatremia: Secondary | ICD-10-CM | POA: Diagnosis not present

## 2021-10-16 LAB — SODIUM: Sodium: 128 mmol/L — ABNORMAL LOW (ref 135–145)

## 2021-10-16 NOTE — Progress Notes (Signed)
PROGRESS NOTE  Jim Dunn RFX:[id number] DOB: 1966-04-05 DOA: 09/24/2021 PCP: Pcp, No  Brief History   Patient is a 56 year old male with history of schizophrenia, chronic hyponatremia, seizure disorder, tobacco use, systolic congestive heart failure, COPD who was brought here on 09/24/2021 to ED by his family after they were unable to take care of him.  He was drinking a lot of water and trying to burn things.  Previously he was staying  at group homes and was kicked out.  Neurology and psychiatry were consulted after admission.  Hospital course remarkable for acute on chronic hyponatremia.  Prolonged hospitalization.  TOC following.  Pending guardianship and placement.  The patient eloped again today. He was found smoking outside with a cup of coffee.  On 09/12/2021 the patient asked for something to help quiet down his mind. I added seroquel 50 mg qhs. Today he states that it has helped.  Consultants  Psychiatry Neurology  Procedures    Antibiotics   Anti-infectives (From admission, onward)    None      Interval History/Subjective    Objective   Vitals:  Vitals:   10/16/21 0700 10/16/21 1440  BP: 138/83 (!) 146/121  Pulse: 82 93  Resp: 18 18  Temp: 98.9 F (37.2 C) 98 F (36.7 C)  SpO2: 100% 99%    Exam:  Constitutional:  The patient is awake, alert, and oriented x 3. No acute distress. Respiratory:  No increased work of breathing. No wheezes, rales, or rhonchi No tactile fremitus Cardiovascular:  Regular rate and rhythm No murmurs, ectopy, or gallups. No lateral PMI. No thrills. Abdomen:  Abdomen is soft, non-tender, non-distended No hernias, masses, or organomegaly Normoactive bowel sounds.  Musculoskeletal:  No cyanosis, clubbing, or edema Skin:  No rashes, lesions, ulcers palpation of skin: no induration or nodules Neurologic:  CN 2-12 intact Sensation all 4 extremities intact Psychiatric:  Mental status Mood, affect appropriate Orientation to  person, place, time  judgment and insight appear intact  I have personally reviewed the following:   Today's Data  Vitals  Lab Data  BMP CBC  Micro Data    Imaging    Cardiology Data    Other Data    Scheduled Meds:  atorvastatin  40 mg Oral Daily   digoxin  0.25 mg Oral Daily   divalproex  500 mg Oral BID   enoxaparin (LOVENOX) injection  40 mg Subcutaneous Q24H   furosemide  20 mg Oral Daily   melatonin  3 mg Oral QHS   mirtazapine  15 mg Oral QHS   nicotine  14 mg Transdermal Daily   paliperidone  3 mg Oral Daily   pneumococcal 23 valent vaccine  0.5 mL Intramuscular Tomorrow-1000   polyethylene glycol  17 g Oral Daily   QUEtiapine  50 mg Oral QHS   sacubitril-valsartan  1 tablet Oral BID   senna-docusate  2 tablet Oral BID   sodium chloride  1 g Oral BID WC    Principal Problem:   Chronic hyponatremia Active Problems:   Chronic systolic CHF (congestive heart failure) (HCC)   Mixed hyperlipidemia   Seizure disorder (HCC)   Paranoid schizophrenia with hallucinations   Memory loss   LOS: 17 days   A & P  Paranoid schizophrenia with hallucinations -hx of noncompliance and various physicians filling medication Having hallucinations that can frighten him and make him sad Psychiatry following and will manage medication, appreciate  The patient is receiving Invega 3 mg daily. I have added night  time Seroquel as the patient has requested something to "quiet down his mind." The patient today states that it has helped. Will need guardianship and placement.   Chronic systolic CHF (congestive heart failure) (HCC) Appears euvolemic Continue home meds of entresto, digoxin and lasix 20 mg daily. Digoxin level subtherapeutic. Will recheck. Strict I/O and daily weights Fluid restriction for chronic hyponatremia Last echo: 5/21 with EF of 25-30% with global hypokinesis, RV overload and indeterminate diastolic dysfunction. Does not appear that he has followed up with  cardiology. Gets meds from ED.  Echocardiogram performed on 09/26/2021 EF of 30-35% with moderately decreased systolic function and global hypokinesis. RV size and systolic function is normal.   Mixed hyperlipidemia Continue lipitor, do not think he has been taking   Seizure disorder (HCC) Receiving depakote 500mg  BID. Unsure if seizures were due to hyponatremia vs. Alcohol or true seizure disorder. 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  Will ask neurology to see to make recommendations from their point of view Checking ammonia, TSH and B12.  Ethanol wnl, UDS pending History of heavy drinking in past, check thiamine level, start thiamine  Will check brain MRI with and without contrast, can't not see that this has been done.   Chronic hyponatremia 56 year old with acute change in normal mental status with increased polydipsia presenting with hyponatremia.  -baseline 120-126 -he appears to be at his baseline, doubt that his increasing memory loss is due to this.  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 Urine studies pending from ED.stop IVF, does not appear dehydrated or volume overloaded from his CHF -1500 cc fluid restriction  -sodium was 124 today. -Sodium tablets restarted at 1 gm PO bid.   COPD (chronic obstructive pulmonary disease) (HCC) Respiratory status is stable. Continue to monitor.   I have seen and examined this patient myself. I have spent 32 minutes in his evaluation and care.  DVT prophylaxis: Lovenox Code Status: Full Code Family Communication: None available Disposition Plan: Awaiting the appointment of guardianship and placement. Patient remains an elopement risk.   Odies Desa, DO [facility] Direct contact: see [email or web address]  7PM-7AM contact night coverage as above 10/16/2021, 5:06 PM  LOS: 13 days

## 2021-10-17 DIAGNOSIS — E871 Hypo-osmolality and hyponatremia: Secondary | ICD-10-CM | POA: Diagnosis not present

## 2021-10-17 LAB — SODIUM: Sodium: 132 mmol/L — ABNORMAL LOW (ref 135–145)

## 2021-10-17 NOTE — Progress Notes (Signed)
Mobility Specialist Progress Note    10/17/21 1039  Mobility  Bed Position Chair  Activity Ambulated independently in hallway  Level of Assistance Standby assist, set-up cues, supervision of patient - no hands on  Assistive Device None  Distance Ambulated (ft) 550 ft  Activity Response Tolerated well  $Mobility charge 1 Mobility   Pt met in hall and requesting I join him to walk. No complaints. Left in chair with call bell in reach.   [facility] Mobility Specialist  M.S. 5N: [phone]

## 2021-10-17 NOTE — Progress Notes (Signed)
PROGRESS NOTE  Jim Dunn UYQ:[id number] DOB: 09/01/1966 DOA: 09/24/2021 PCP: Pcp, No  Brief History   Patient is a 56 year old male with history of schizophrenia, chronic hyponatremia, seizure disorder, tobacco use, systolic congestive heart failure, COPD who was brought here on 09/24/2021 to ED by his family after they were unable to take care of him.  He was drinking a lot of water and trying to burn things.  Previously he was staying  at group homes and was kicked out.  Neurology and psychiatry were consulted after admission.  Hospital course remarkable for acute on chronic hyponatremia.  Prolonged hospitalization.  TOC following.  Pending guardianship and placement.  The patient eloped again today. He was found smoking outside with a cup of coffee.  On 09/12/2021 the patient asked for something to help quiet down his mind. I added seroquel 50 mg qhs.   Consultants  Psychiatry Neurology  Procedures    Antibiotics   Anti-infectives (From admission, onward)    None      Interval History/Subjective  The patient is resting quietly. No new complaints.  Objective   Vitals:  Vitals:   10/17/21 0403 10/17/21 0858  BP: (!) 150/86 134/77  Pulse: 76 70  Resp: 18 20  Temp: 98.2 F (36.8 C) 98 F (36.7 C)  SpO2: 99% 98%    Exam:  Constitutional:  The patient is awake, alert, and oriented x 3. No acute distress. Respiratory:  No increased work of breathing. No wheezes, rales, or rhonchi No tactile fremitus Cardiovascular:  Regular rate and rhythm No murmurs, ectopy, or gallups. No lateral PMI. No thrills. Abdomen:  Abdomen is soft, non-tender, non-distended No hernias, masses, or organomegaly Normoactive bowel sounds.  Musculoskeletal:  No cyanosis, clubbing, or edema Skin:  No rashes, lesions, ulcers palpation of skin: no induration or nodules Neurologic:  CN 2-12 intact Sensation all 4 extremities intact Psychiatric:  Mental status Mood, affect  appropriate Orientation to person, place, time  judgment and insight appear intact  I have personally reviewed the following:   Today's Data  Vitals  Lab Data  BMP CBC  Micro Data    Imaging    Cardiology Data    Other Data    Scheduled Meds:  atorvastatin  40 mg Oral Daily   digoxin  0.25 mg Oral Daily   divalproex  500 mg Oral BID   enoxaparin (LOVENOX) injection  40 mg Subcutaneous Q24H   furosemide  20 mg Oral Daily   melatonin  3 mg Oral QHS   mirtazapine  15 mg Oral QHS   nicotine  14 mg Transdermal Daily   paliperidone  3 mg Oral Daily   pneumococcal 23 valent vaccine  0.5 mL Intramuscular Tomorrow-1000   polyethylene glycol  17 g Oral Daily   QUEtiapine  50 mg Oral QHS   sacubitril-valsartan  1 tablet Oral BID   senna-docusate  2 tablet Oral BID   sodium chloride  1 g Oral BID WC    Principal Problem:   Chronic hyponatremia Active Problems:   Chronic systolic CHF (congestive heart failure) (HCC)   Mixed hyperlipidemia   Seizure disorder (HCC)   Paranoid schizophrenia with hallucinations   Memory loss   LOS: 18 days   A & P  Paranoid schizophrenia with hallucinations -hx of noncompliance and various physicians filling medication Having hallucinations that can frighten him and make him sad Psychiatry following and will manage medication, appreciate  The patient is receiving Invega 3 mg daily. I have added  night time Seroquel as the patient has requested something to "quiet down his mind." The patient today states that it has helped. Will need guardianship and placement.   Chronic systolic CHF (congestive heart failure) (HCC) Appears euvolemic Continue home meds of entresto, digoxin and lasix 20 mg daily. Digoxin level subtherapeutic. Will recheck. Strict I/O and daily weights Fluid restriction for chronic hyponatremia Last echo: 5/21 with EF of 25-30% with global hypokinesis, RV overload and indeterminate diastolic dysfunction. Does not appear  that he has followed up with cardiology. Gets meds from ED.  Echocardiogram performed on 09/26/2021 EF of 30-35% with moderately decreased systolic function and global hypokinesis. RV size and systolic function is normal.   Mixed hyperlipidemia Continue lipitor, do not think he has been taking   Seizure disorder (HCC) Receiving depakote 500mg  BID. Unsure if seizures were due to hyponatremia vs. Alcohol or true seizure disorder. 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  Will ask neurology to see to make recommendations from their point of view Checking ammonia, TSH and B12.  Ethanol wnl, UDS pending History of heavy drinking in past, check thiamine level, start thiamine  Will check brain MRI with and without contrast, can't not see that this has been done.   Chronic hyponatremia 56 year old with acute change in normal mental status with increased polydipsia presenting with hyponatremia.  -baseline 120-126 -he appears to be at his baseline, doubt that his increasing memory loss is due to this.  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 Urine studies pending from ED.stop IVF, does not appear dehydrated or volume overloaded from his CHF -1500 cc fluid restriction  -sodium was 124 today. -Sodium tablets restarted at 1 gm PO bid.   COPD (chronic obstructive pulmonary disease) (HCC) Respiratory status is stable. Continue to monitor.   I have seen and examined this patient myself. I have spent 28 minutes in his evaluation and care.  DVT prophylaxis: Lovenox Code Status: Full Code Family Communication: None available Disposition Plan: Awaiting the appointment of guardianship and placement. Patient remains an elopement risk.   Kiven Vangilder,  DO [facility] Direct contact: see [email or web address]  7PM-7AM contact night coverage as above 10/17/2021, 5:42 PM  LOS: 13 days

## 2021-10-17 NOTE — Plan of Care (Signed)

## 2021-10-18 DIAGNOSIS — E871 Hypo-osmolality and hyponatremia: Secondary | ICD-10-CM | POA: Diagnosis not present

## 2021-10-18 LAB — SODIUM: Sodium: 131 mmol/L — ABNORMAL LOW (ref 135–145)

## 2021-10-18 NOTE — Progress Notes (Signed)
PROGRESS NOTE  Jim Dunn RFF:[id number] DOB: 05/18/1966 DOA: 09/24/2021 PCP: Pcp, No  Brief History   Patient is a 56 year old male with history of schizophrenia, chronic hyponatremia, seizure disorder, tobacco use, systolic congestive heart failure, COPD who was brought here on 09/24/2021 to ED by his family after they were unable to take care of him.  He was drinking a lot of water and trying to burn things.  Previously he was staying  at group homes and was kicked out.  Neurology and psychiatry were consulted after admission.  Hospital course remarkable for acute on chronic hyponatremia.  Prolonged hospitalization.  TOC following.  Pending guardianship and placement.  The patient eloped again today. He was found smoking outside with a cup of coffee.  On 09/12/2021 the patient asked for something to help quiet down his mind. I added seroquel 50 mg qhs.   Consultants  Psychiatry Neurology  Procedures    Antibiotics   Anti-infectives (From admission, onward)    None      Interval History/Subjective  The patient is resting quietly. No new complaints.  Objective   Vitals:  Vitals:   10/18/21 0812 10/18/21 1541  BP: (!) 162/89 (!) 144/84  Pulse: 63 92  Resp: 18 17  Temp: 97.8 F (36.6 C) 98 F (36.7 C)  SpO2: 95% 96%    Exam:  Constitutional:  The patient is awake, alert, and oriented x 3. No acute distress. Respiratory:  No increased work of breathing. No wheezes, rales, or rhonchi No tactile fremitus Cardiovascular:  Regular rate and rhythm No murmurs, ectopy, or gallups. No lateral PMI. No thrills. Abdomen:  Abdomen is soft, non-tender, non-distended No hernias, masses, or organomegaly Normoactive bowel sounds.  Musculoskeletal:  No cyanosis, clubbing, or edema Skin:  No rashes, lesions, ulcers palpation of skin: no induration or nodules Neurologic:  CN 2-12 intact Sensation all 4 extremities intact Psychiatric:  Mental status Mood, affect  appropriate Orientation to person, place, time  judgment and insight appear intact  I have personally reviewed the following:   Today's Data  Vitals  Lab Data  BMP CBC  Micro Data    Imaging    Cardiology Data    Other Data    Scheduled Meds:  atorvastatin  40 mg Oral Daily   digoxin  0.25 mg Oral Daily   divalproex  500 mg Oral BID   enoxaparin (LOVENOX) injection  40 mg Subcutaneous Q24H   furosemide  20 mg Oral Daily   melatonin  3 mg Oral QHS   mirtazapine  15 mg Oral QHS   nicotine  14 mg Transdermal Daily   paliperidone  3 mg Oral Daily   pneumococcal 23 valent vaccine  0.5 mL Intramuscular Tomorrow-1000   polyethylene glycol  17 g Oral Daily   QUEtiapine  50 mg Oral QHS   sacubitril-valsartan  1 tablet Oral BID   senna-docusate  2 tablet Oral BID   sodium chloride  1 g Oral BID WC    Principal Problem:   Chronic hyponatremia Active Problems:   Chronic systolic CHF (congestive heart failure) (HCC)   Mixed hyperlipidemia   Seizure disorder (HCC)   Paranoid schizophrenia with hallucinations   Memory loss   LOS: 19 days   A & P  Paranoid schizophrenia with hallucinations -hx of noncompliance and various physicians filling medication Having hallucinations that can frighten him and make him sad Psychiatry following and will manage medication, appreciate  The patient is receiving Invega 3 mg daily. I have  added night time Seroquel as the patient has requested something to "quiet down his mind." The patient today states that it has helped. Will need guardianship and placement.   Chronic systolic CHF (congestive heart failure) (HCC) Appears euvolemic Continue home meds of entresto, digoxin and lasix 20 mg daily. Digoxin level subtherapeutic. Will recheck. Strict I/O and daily weights Fluid restriction for chronic hyponatremia Last echo: 5/21 with EF of 25-30% with global hypokinesis, RV overload and indeterminate diastolic dysfunction. Does not appear  that he has followed up with cardiology. Gets meds from ED.  Echocardiogram performed on 09/26/2021 EF of 30-35% with moderately decreased systolic function and global hypokinesis. RV size and systolic function is normal.   Mixed hyperlipidemia Continue lipitor, do not think he has been taking   Seizure disorder (HCC) Receiving depakote 500mg  BID. Unsure if seizures were due to hyponatremia vs. Alcohol or true seizure disorder. 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  Will ask neurology to see to make recommendations from their point of view Checking ammonia, TSH and B12.  Ethanol wnl, UDS pending History of heavy drinking in past, check thiamine level, start thiamine  MRI brain has demonstrated no acute or reversible finding.No brain edema or gliosis. 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 This has appeared stable since 10/2019.  Chronic hyponatremia 56 year old with acute change in normal mental status with increased polydipsia presenting with hyponatremia.  -baseline 120-126 -he appears to be at his baseline, doubt that his increasing memory loss is due to this.  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 Urine studies pending from ED.stop IVF, does not appear dehydrated or volume overloaded from his CHF -1500 cc fluid restriction  -sodium was 124 today. -Sodium tablets restarted at 1 gm PO bid.   COPD (chronic obstructive pulmonary disease) (HCC) Respiratory status is stable. Continue to monitor.   I have seen and examined this patient myself. I have spent 30 minutes in his evaluation and care.  DVT prophylaxis: Lovenox Code Status: Full Code Family Communication: None available Disposition Plan: Awaiting  the appointment of guardianship and placement. Patient remains an elopement risk.   [name], DO [facility] Direct contact: see [email or web address]  7PM-7AM contact night coverage as above 10/18/2021, 6:35 PM  LOS: 13 days

## 2021-10-18 NOTE — Progress Notes (Signed)
Mobility Specialist Progress Note    10/18/21 0955  Mobility  Bed Position  (window bench)  Activity Ambulated independently in hallway  Level of Assistance Independent  Assistive Device None  Distance Ambulated (ft) 530 ft  Activity Response Tolerated well  $Mobility charge 1 Mobility   Pt received in doorway requesting a walk. No complaints. Wanted to sit at bench in front of window, RN notified.   [facility] Mobility Specialist  M.S. 5N: [phone]

## 2021-10-18 NOTE — Plan of Care (Signed)
°  Problem: Education: Goal: Knowledge of General Education information will improve Description: Including pain rating scale, medication(s)/side effects and non-pharmacologic comfort measures Outcome: Progressing   Problem: Health Behavior/Discharge Planning: Goal: Ability to manage health-related needs will improve Outcome: Progressing   Problem: Clinical Measurements: Goal: Ability to maintain clinical measurements within normal limits will improve Outcome: Progressing   Problem: Education: Goal: Knowledge of General Education information will improve Description: Including pain rating scale, medication(s)/side effects and non-pharmacologic comfort measures Outcome: Progressing   Problem: Health Behavior/Discharge Planning: Goal: Ability to manage health-related needs will improve Outcome: Progressing   Problem: Clinical Measurements: Goal: Ability to maintain clinical measurements within normal limits will improve Outcome: Progressing Goal: Will remain free from infection Outcome: Progressing Goal: Diagnostic test results will improve Outcome: Progressing Goal: Respiratory complications will improve Outcome: Progressing Goal: Cardiovascular complication will be avoided Outcome: Progressing   Problem: Activity: Goal: Risk for activity intolerance will decrease Outcome: Progressing   Problem: Nutrition: Goal: Adequate nutrition will be maintained Outcome: Progressing   Problem: Coping: Goal: Level of anxiety will decrease Outcome: Progressing   Problem: Elimination: Goal: Will not experience complications related to bowel motility Outcome: Progressing Goal: Will not experience complications related to urinary retention Outcome: Progressing   Problem: Pain Managment: Goal: General experience of comfort will improve Outcome: Progressing   Problem: Safety: Goal: Ability to remain free from injury will improve Outcome: Progressing   Problem: Skin  Integrity: Goal: Risk for impaired skin integrity will decrease Outcome: Progressing   

## 2021-10-19 DIAGNOSIS — E871 Hypo-osmolality and hyponatremia: Secondary | ICD-10-CM | POA: Diagnosis not present

## 2021-10-19 DIAGNOSIS — I5022 Chronic systolic (congestive) heart failure: Secondary | ICD-10-CM | POA: Diagnosis not present

## 2021-10-19 LAB — BASIC METABOLIC PANEL
Anion gap: 8 (ref 5–15)
BUN: 13 mg/dL (ref 6–20)
CO2: 25 mmol/L (ref 22–32)
Calcium: 8.9 mg/dL (ref 8.9–10.3)
Chloride: 98 mmol/L (ref 98–111)
Creatinine, Ser: 0.61 mg/dL (ref 0.61–1.24)
GFR, Estimated: >60 mL/min (ref >60)
Glucose, Bld: 100 mg/dL — ABNORMAL HIGH (ref 70–99)
Potassium: 4.3 mmol/L (ref 3.5–5.1)
Sodium: 131 mmol/L — ABNORMAL LOW (ref 135–145)

## 2021-10-19 LAB — CBC WITH DIFFERENTIAL/PLATELET
Abs Immature Granulocytes: 0.14 10*3/uL — ABNORMAL HIGH (ref 0.00–0.07)
Basophils Absolute: 0 10*3/uL (ref 0.0–0.1)
Basophils Relative: 0 %
Eosinophils Absolute: 0.2 10*3/uL (ref 0.0–0.5)
Eosinophils Relative: 2 %
HCT: 35.2 % — ABNORMAL LOW (ref 39.0–52.0)
Hemoglobin: 12.1 g/dL — ABNORMAL LOW (ref 13.0–17.0)
Immature Granulocytes: 2 %
Lymphocytes Relative: 47 %
Lymphs Abs: 3.9 10*3/uL (ref 0.7–4.0)
MCH: 23.4 pg — ABNORMAL LOW (ref 26.0–34.0)
MCHC: 34.4 g/dL (ref 30.0–36.0)
MCV: 68.1 fL — ABNORMAL LOW (ref 80.0–100.0)
Monocytes Absolute: 1 10*3/uL (ref 0.1–1.0)
Monocytes Relative: 12 %
Neutro Abs: 3 10*3/uL (ref 1.7–7.7)
Neutrophils Relative %: 37 %
Platelets: 157 10*3/uL (ref 150–400)
RBC: 5.17 MIL/uL (ref 4.22–5.81)
RDW: 16.4 % — ABNORMAL HIGH (ref 11.5–15.5)
WBC: 8.2 10*3/uL (ref 4.0–10.5)
nRBC: 0 % (ref 0.0–0.2)

## 2021-10-19 NOTE — Plan of Care (Signed)
°  Problem: Education: Goal: Knowledge of General Education information will improve Description: Including pain rating scale, medication(s)/side effects and non-pharmacologic comfort measures Outcome: Progressing   Problem: Health Behavior/Discharge Planning: Goal: Ability to manage health-related needs will improve Outcome: Progressing   Problem: Clinical Measurements: Goal: Ability to maintain clinical measurements within normal limits will improve Outcome: Progressing   Problem: Education: Goal: Knowledge of General Education information will improve Description: Including pain rating scale, medication(s)/side effects and non-pharmacologic comfort measures Outcome: Progressing

## 2021-10-19 NOTE — Progress Notes (Signed)
[facility] PROGRESS NOTE   Jim Dunn OEU:[id number] DOB: 1966-07-10 DOA: 09/24/2021  PCP: Pcp, No  Brief History/Interval Summary: Patient is a 56 year old male with history of schizophrenia, chronic hyponatremia, seizure disorder, tobacco use, systolic congestive heart failure, COPD who was brought here on 09/24/2021 to ED by his family after they were unable to take care of him.  He was drinking a lot of water and trying to burn things.  Previously he was staying at group home and was kicked out.  Neurology and psychiatry were consulted after admission.  Hospital course remarkable for acute on chronic hyponatremia.  Prolonged hospitalization.  TOC following.  Pending guardianship and placement.   Consultants: Psychiatry and neurology  Procedures: None    Subjective/Interval History: Patient walking with elevated.  Denies any complaints.  No nausea vomiting.     Assessment/Plan:  Paranoid schizophrenia with hallucinations -hx of noncompliance and various physicians filling medication Having hallucinations that can frighten him and make him sad. Patient was seen by psychiatry.  Medications were adjusted. Noted to be on Depakote, mirtazapine, Invega, Seroquel Will need guardianship and placement.    Chronic systolic CHF (congestive heart failure) Last echo: 5/21 with EF of 25-30% with global hypokinesis, RV overload and indeterminate diastolic dysfunction. Does not appear that he has followed up with cardiology. Gets meds from ED.  Echocardiogram performed on 09/26/2021 EF of 30-35% with moderately decreased systolic function and global hypokinesis. RV size and systolic function is normal.  Seems to be stable from a cardiac standpoint.  Appears to be euvolemic.  Noted to be on furosemide, Entresto, digoxin and Lipitor.  Monitor electrolytes periodically. Potassium noted to be normal today.  Dig level 0.2 on 2/10.   Mixed hyperlipidemia Continue lipitor   Seizure  disorder Continue Depakote.   Unsure if seizures were due to hyponatremia vs. Alcohol or true seizure disorder.  Seen by neurology.   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  Was seen by neurology.  MRI brain was done which did not show any acute findings. Ammonia level was noted to be mildly elevated which could be from Depakote. TSH noted to be normal.  Vitamin B12 normal at 698. No further work-up recommended by neurology.   Chronic hyponatremia 56 year old with acute change in normal mental status with increased polydipsia presenting with hyponatremia.  Has a longstanding history of hyponatremia.  Sodium levels fluctuating but within his baseline range.  Mentation is stable. Noted to be on salt tablets.   COPD (chronic obstructive pulmonary disease) (HCC) Respiratory status is stable. Continue to monitor.  Obesity Estimated body mass index is 36.14 kg/m as calculated from the following:   Height as of this encounter: 5\' 8"  (1.727 m).   Weight as of this encounter: 107.8 kg.    DVT Prophylaxis: Lovenox Code Status: Full code Family Communication: Discussed with patient.  No family at bedside Disposition Plan: To be determined  Status is: Inpatient  Remains inpatient appropriate because: Unsafe discharge plan      Medications: Scheduled:  atorvastatin  40 mg Oral Daily   digoxin  0.25 mg Oral Daily   divalproex  500 mg Oral BID   enoxaparin (LOVENOX) injection  40 mg Subcutaneous Q24H   furosemide  20 mg Oral Daily   melatonin  3 mg Oral QHS   mirtazapine  15 mg Oral QHS   nicotine  14 mg  Transdermal Daily   paliperidone  3 mg Oral Daily   pneumococcal 23 valent vaccine  0.5 mL Intramuscular Tomorrow-1000   polyethylene glycol  17 g Oral Daily   QUEtiapine  50 mg Oral QHS   sacubitril-valsartan  1 tablet Oral BID   senna-docusate  2 tablet Oral BID   sodium chloride   1 g Oral BID WC   Continuous: LEX:[id number] **OR** acetaminophen, ALPRAZolam, hydrOXYzine  Antibiotics: Anti-infectives (From admission, onward)    None       Objective:  Vital Signs  Vitals:   10/18/21 2100 10/19/21 0733 10/19/21 0853 10/19/21 0900  BP:  (!) 167/102 137/86   Pulse:  75 77   Resp:  16 16   Temp:   98.1 F (36.7 C)   TempSrc:   Oral   SpO2:  100% 99%   Weight:    107.8 kg  Height: 5\' 8"  (1.727 m)       Intake/Output Summary (Last 24 hours) at 10/19/2021 1156 Last data filed at 10/19/2021 1132 Gross per 24 hour  Intake 600 ml  Output --  Net 600 ml   Filed Weights   10/17/21 1639 10/18/21 0455 10/19/21 0900  Weight: 87.2 kg 87.8 kg 107.8 kg    General appearance: Awake alert.  In no distress Resp: Clear to auscultation bilaterally.  Normal effort Cardio: S1-S2 is normal regular.  No S3-S4.  No rubs murmurs or bruit GI: Abdomen is soft.  Nontender nondistended.  Bowel sounds are present normal.  No masses organomegaly   Lab Results:  Data Reviewed: I have personally reviewed following labs and reports of the imaging studies  CBC: Recent Labs  Lab 10/13/21 0259 10/19/21 0337  WBC 8.5 8.2  NEUTROABS 3.2 3.0  HGB 12.0* 12.1*  HCT 35.9* 35.2*  MCV 68.1* 68.1*  PLT 166 157    Basic Metabolic Panel: Recent Labs  Lab 10/13/21 0259 10/14/21 0317 10/15/21 0103 10/16/21 0258 10/17/21 0127 10/18/21 0446 10/19/21 0337  NA 128* 126* 129* 128* 132* 131* 131*  K 4.3 4.0 4.4  --   --   --  4.3  CL 95* 94* 96*  --   --   --  98  CO2 25 19* 25  --   --   --  25  GLUCOSE 89 93 102*  --   --   --  100*  BUN 9 15 13   --   --   --  13  CREATININE 0.64 0.58* 0.69  --   --   --  0.61  CALCIUM 8.8* 8.6* 8.9  --   --   --  8.9    GFR: Estimated Creatinine Clearance: 127.2 mL/min (by C-G formula based on SCr of 0.61 mg/dL).   Radiology Studies: No results found.     LOS: 20 days   Jim Dunn 10/21/21 on  [email or web address]  10/19/2021, 11:56 AM

## 2021-10-19 NOTE — TOC Progression Note (Signed)
Transition of Care [facility]) - Progression Note    Patient Details  Name: Manual Broas MRN: [id number] Date of Birth: 1965/12/02  Transition of Care [facility]) CM/SW Contact  Joanne Chars, LCSW Phone Number: 10/19/2021, 10:04 AM  Clinical Narrative:   CSW received voicemail from [place] with [facility].  [phone] or [phone] (office)  CSW returned call, had to leave message.     Expected Discharge Plan: Home/Self Care Barriers to Discharge: Other (must enter comment), Continued Medical Work up (family declining to continue to care for pt)  Expected Discharge Plan and Services Expected Discharge Plan: Home/Self Care In-house Referral: Clinical Social Work   Post Acute Care Choice:  (TBD) Living arrangements for the past 2 months: Single Family Home                                       Social Determinants of Health (SDOH) Interventions    Readmission Risk Interventions Readmission Risk Prevention Plan 01/23/2020  Transportation Screening Complete  HRI or Home Care Consult Complete  Social Work Consult for [place] Planning/Counseling [place] Not Applicable  Medication Review [employer] photographer) (No Data)

## 2021-10-19 NOTE — Progress Notes (Signed)
Mobility Specialist Progress Note    10/19/21 1215  Mobility  Bed Position Chair  Activity Ambulated independently in hallway  Level of Assistance Independent  Assistive Device None  Distance Ambulated (ft) 550 ft  Activity Response Tolerated well  $Mobility charge 1 Mobility   Pt received in bed and agreeable. No complaints on walk. Returned to chair with call bell in reach.    [facility] Mobility Specialist  M.S. 5N: [phone]

## 2021-10-19 NOTE — Progress Notes (Signed)
pt. walked off unit main desk called and informed this nurse that pt was downstairs (2nd floor) walking the hallway. They have seen him outside on the sidewalk and he has also been to the ED. Educated pt that he is not allowed to wonder around the hospital for safety issues. MD made aware.

## 2021-10-19 NOTE — Progress Notes (Signed)
Mobility Specialist Progress Note    10/19/21 1719  Mobility  Bed Position Chair  Activity Ambulated independently in hallway  Level of Assistance Independent  Assistive Device None  Distance Ambulated (ft) 600 ft  Activity Response Tolerated well  $Mobility charge 1 Mobility   Pt requesting walk. No complaints. Left sitting by the window.   [facility] Mobility Specialist  M.S. 5N: [phone]

## 2021-10-20 DIAGNOSIS — G629 Polyneuropathy, unspecified: Secondary | ICD-10-CM

## 2021-10-20 DIAGNOSIS — E871 Hypo-osmolality and hyponatremia: Secondary | ICD-10-CM | POA: Diagnosis not present

## 2021-10-20 LAB — SODIUM: Sodium: 129 mmol/L — ABNORMAL LOW (ref 135–145)

## 2021-10-20 MED ORDER — PREGABALIN 25 MG PO CAPS
25.0000 mg | ORAL_CAPSULE | Freq: Three times a day (TID) | ORAL | Status: DC
  Administered 2021-10-20 – 2021-12-08 (×150): 25 mg via ORAL
  Filled 2021-10-20 (×150): qty 1

## 2021-10-20 NOTE — Assessment & Plan Note (Addendum)
Chronic issue and was present during previous admission Likely related to years of substance abuse Continue low-dose Lyrica since was effective previous  A physical therapy consult is indicated based on the patients mobility assessment.   Mobility Assessment (last 72 hours)    Mobility Assessment    Row Name 11/01/21 0730 10/31/21 0730 10/30/21 1128 10/29/21 2100 10/29/21 0808   Does patient have an order for bedrest or is patient medically unstable No - Continue assessment No - Continue assessment No - Continue assessment No - Continue assessment No - Continue assessment   What is the highest level of mobility based on the progressive mobility assessment? Level 6 (Walks independently in room and hall) - Balance while walking in room without assist - Complete Level 6 (Walks independently in room and hall) - Balance while walking in room without assist - Complete Level 6 (Walks independently in room and hall) - Balance while walking in room without assist - Complete Level 6 (Walks independently in room and hall) - Balance while walking in room without assist - Complete Level 5 (Walks with assist in room/hall) - Balance while stepping forward/back and can walk in room with assist - Complete

## 2021-10-20 NOTE — Progress Notes (Signed)
This nurse has not seen pt on floor in over 30 min.

## 2021-10-20 NOTE — Progress Notes (Signed)
Pt back on unit and educated that he can not leave alone. He has to be accompanied by a staff member.

## 2021-10-20 NOTE — Progress Notes (Signed)
Pt was seen by on coming secretary out in parking deck.
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
## 2021-10-20 NOTE — Progress Notes (Signed)
CSW spoke with [facility] who states she has been speaking with patient's family and has staffed the case with APS supervisor who will make the final decision to determine if guardianship is needed or not. Patient will likely require placement in a group home as his family members cannot care for him.  [name], MSW, LCSW Transitions of Care   Clinical Social Worker II [phone]

## 2021-10-20 NOTE — Progress Notes (Signed)
Mobility Specialist Progress Note    10/20/21 1041  Mobility  Activity Ambulated independently in hallway  Level of Assistance Independent  Assistive Device None  Distance Ambulated (ft) 1200 ft  Activity Response Tolerated well  $Mobility charge 1 Mobility   Pt requesting I join him for walk. No complaints. Left pt in room.   [facility] Mobility Specialist  M.S. 5N: [phone]

## 2021-10-21 DIAGNOSIS — E871 Hypo-osmolality and hyponatremia: Secondary | ICD-10-CM | POA: Diagnosis not present

## 2021-10-21 LAB — RAPID URINE DRUG SCREEN, HOSP PERFORMED
Amphetamines: NOT DETECTED
Barbiturates: NOT DETECTED
Benzodiazepines: POSITIVE — AB
Cocaine: NOT DETECTED
Opiates: NOT DETECTED
Tetrahydrocannabinol: NOT DETECTED

## 2021-10-21 LAB — SODIUM: Sodium: 128 mmol/L — ABNORMAL LOW (ref 135–145)

## 2021-10-21 LAB — VALPROIC ACID LEVEL: Valproic Acid Lvl: 41 ug/mL — ABNORMAL LOW (ref 50.0–100.0)

## 2021-10-21 NOTE — Progress Notes (Signed)
Mobility Specialist Progress Note    10/21/21 1347  Mobility  Activity Ambulated independently in hallway  Level of Assistance Independent  Assistive Device None  Distance Ambulated (ft) 1000 ft  Activity Response Tolerated well  $Mobility charge 1 Mobility   Left standing by back Estate agent.   [facility] Mobility Specialist  M.S. 5N: [phone]

## 2021-10-21 NOTE — Progress Notes (Signed)
Progress Note   Patient: Jim Dunn [id number] DOB: 04-15-1966 DOA: 09/24/2021     22 DOS: the patient was seen and examined on 10/21/2021   Brief hospital course: 56 year old male with history of schizophrenia, chronic hyponatremia, seizure disorder, tobacco use, systolic congestive heart failure, COPD who was brought here on 09/24/2021 to ED by his family after they were unable to take care of him.  He was drinking a lot of water and trying to burn things.  Previously he was staying at group home and was kicked out.  Neurology and psychiatry were consulted after admission.  Hospital course remarkable for acute on chronic hyponatremia.  Prolonged hospitalization.  TOC following. APS in process to determine if they will pursue guardianship  Assessment and Plan: * Chronic hyponatremia- (present on admission) Presented with symptomatic hyponatremia in context of polydipsia prior to admission-this is a recurrent problem Continue 1500 cc per 24-hour fluid restriction Na has ranged between 128 and 132 over the past several days Continue sodium tablets restarted at 1 gm PO bid.   Peripheral neuropathy Chronic issue and was present during previous admission Likely related to years of substance abuse Resume low-dose Lyrica since was effective previous  Dementia without behavioral disturbance ([place])- (present on admission) During initial admission over 1 year ago had formal neuropsychiatric evaluation by Dr. Sima Matas who diagnosed patient with dementia likely related to years of schizophrenia and nonadherence to medication regimen At that time he was discharged to an ALF [facility] evaluation worsened since previous admission noting has declined from 18-12.  He lacks capacity to make decisions APS investigating guardianship noting no other family members or friends are willing to pursue guardianship On the afternoon of 2/16 I was notified that the patient could not be located on the unit and was later  seen by staff out of the parking deck.  He returned to the unit independently.  Was educated by staff to not leave the unit without being accompanied by staff member since this was a significant risk. UDS positive for benzodiazepine during this hospitalization    Paranoid schizophrenia with hallucinations- (present on admission) Appreciate psychiatric team help Schizophrenia symptoms and decompensation worsened by recurrent hyponatremia Also has a component of anxiety and during previous admission requested antianxiety medications Continue Seroquel and Invega  Seizure disorder ([place]) Continue Depakote-valproic acid level 41 previous level on 2/10 was 0.2 History of nonadherence and valproic acid on 1/22 was 19-repeat level in a.m. EEG negative for acute seizure activity Continue seizure precaution Depakote can also function as a mood stabilizer MRI revealed arachnoid cyst that is chronic in nature  COPD (chronic obstructive pulmonary disease) ([place])- (present on admission) Compensated Given Pneumovax immunization this admission  Mixed hyperlipidemia- (present on admission) Continue lipitor Apparently has been nonadherent with medicines prior to admission  Chronic systolic CHF (congestive heart failure) ([place])- (present on admission) Appears euvolemic Continue entresto, digoxin and lasix.  Digoxin level subtherapeutic as of 2/10 but this also appears to be a chronic issue.  Follow periodically Strict I/O and daily weights Fluid restriction for chronic hyponatremia Last echo: 5/21 with EF of 25-30% with global hypokinesis, RV overload and indeterminate diastolic dysfunction. Does not appear that he has followed up with cardiology. Gets meds from ED.  Echocardiogram performed on 09/26/2021 EF of 30-35% with moderately decreased systolic function and global hypokinesis. RV size and systolic function is normal. Weight does not appear to be accurate noting on 2/14 he weighed 193 pounds and  on 3/15 he weighed 237  Subjective:  Sleeping soundly  Physical Exam: Vitals:   10/20/21 0542 10/20/21 0810 10/20/21 1413 10/20/21 2020  BP: (!) 144/76 (!) 142/82 (!) 158/93 (!) 162/91  Pulse: 69 82 98 86  Resp:  17 20 16   Temp:  98.1 F (36.7 C) (!) 97.4 F (36.3 C) 98 F (36.7 C)  TempSrc:  Oral  Oral  SpO2: 98% 100% 98% 96%  Weight:      Height:       Gen: No acute distress, calm and sleeping soundly Pulm: Anterior lung sounds clear to auscultation, on room air, no increased work of breathing Card: S1-S2, normotensive, no peripheral edema Abd: Soft nontender nondistended with normal active bowel sounds.  Eating well.  LBM 2/15 Neuro: Sleeping but at baseline cranial nerves II through XII grossly intact, moves all extremities x4 without any focal neurological deficits.  Sensation intact.  Mobilizes independent Psych: Sleeping soundly   A physical therapy consult is indicated based on the patients mobility assessment.   Mobility Assessment (last 72 hours)     Mobility Assessment     Row Name 10/21/21 0930 10/20/21 0740 10/18/21 2000   Does patient have an order for bedrest or is patient medically unstable No - Continue assessment No - Continue assessment No - Continue assessment   What is the highest level of mobility based on the progressive mobility assessment? Level 6 (Walks independently in room and hall) - Balance while walking in room without assist - Complete Level 6 (Walks independently in room and hall) - Balance while walking in room without assist - Complete Level 6 (Walks independently in room and hall) - Balance while walking in room without assist - Complete             Data Reviewed:  Results reviewed  Family Communication:  Patient  Disposition: Remains inpatient appropriate because:  Unsafe DC plan  Planned Discharge Destination:  Barriers to discharge: APS to determine if they will pursue guardianship. Family refusing to take  pt because stating they cannot manage him noting refuses to take meds  Medically stable Yes  COVID vaccination status:  [employer] 07/20/2020  Consultants: Psychiatry Neurology Procedures: EEG Echocardiogram Antibiotics: None     Time spent: 15 minutes  Author: Erin Hearing, NP 10/21/2021 1:55 PM  For on call review [email or web address].

## 2021-10-21 NOTE — Progress Notes (Signed)
CSW spoke with Charlcie Cradle at [facility] to provide clarity regarding patient's recent interaction with Dr. [name] from psychiatry on 10/05/2021.   [name], MSW, LCSW Transitions of Care   Clinical Social Worker II [phone]

## 2021-10-22 DIAGNOSIS — E871 Hypo-osmolality and hyponatremia: Secondary | ICD-10-CM | POA: Diagnosis not present

## 2021-10-22 LAB — CBC WITH DIFFERENTIAL/PLATELET
Abs Immature Granulocytes: 0.14 10*3/uL — ABNORMAL HIGH (ref 0.00–0.07)
Basophils Absolute: 0 10*3/uL (ref 0.0–0.1)
Basophils Relative: 1 %
Eosinophils Absolute: 0.2 10*3/uL (ref 0.0–0.5)
Eosinophils Relative: 2 %
HCT: 37 % — ABNORMAL LOW (ref 39.0–52.0)
Hemoglobin: 12.5 g/dL — ABNORMAL LOW (ref 13.0–17.0)
Immature Granulocytes: 2 %
Lymphocytes Relative: 41 %
Lymphs Abs: 3.7 10*3/uL (ref 0.7–4.0)
MCH: 22.9 pg — ABNORMAL LOW (ref 26.0–34.0)
MCHC: 33.8 g/dL (ref 30.0–36.0)
MCV: 67.8 fL — ABNORMAL LOW (ref 80.0–100.0)
Monocytes Absolute: 1.1 10*3/uL — ABNORMAL HIGH (ref 0.1–1.0)
Monocytes Relative: 13 %
Neutro Abs: 3.6 10*3/uL (ref 1.7–7.7)
Neutrophils Relative %: 41 %
Platelets: 161 10*3/uL (ref 150–400)
RBC: 5.46 MIL/uL (ref 4.22–5.81)
RDW: 15.9 % — ABNORMAL HIGH (ref 11.5–15.5)
WBC: 8.8 10*3/uL (ref 4.0–10.5)
nRBC: 0 % (ref 0.0–0.2)

## 2021-10-22 LAB — COMPREHENSIVE METABOLIC PANEL
ALT: 13 U/L (ref 0–44)
AST: 18 U/L (ref 15–41)
Albumin: 3.5 g/dL (ref 3.5–5.0)
Alkaline Phosphatase: 65 U/L (ref 38–126)
Anion gap: 9 (ref 5–15)
BUN: 13 mg/dL (ref 6–20)
CO2: 25 mmol/L (ref 22–32)
Calcium: 8.9 mg/dL (ref 8.9–10.3)
Chloride: 91 mmol/L — ABNORMAL LOW (ref 98–111)
Creatinine, Ser: 0.64 mg/dL (ref 0.61–1.24)
GFR, Estimated: >60 mL/min (ref >60)
Glucose, Bld: 94 mg/dL (ref 70–99)
Potassium: 4.2 mmol/L (ref 3.5–5.1)
Sodium: 125 mmol/L — ABNORMAL LOW (ref 135–145)
Total Bilirubin: 0.5 mg/dL (ref 0.3–1.2)
Total Protein: 6.2 g/dL — ABNORMAL LOW (ref 6.5–8.1)

## 2021-10-22 MED ORDER — SODIUM CHLORIDE 1 G PO TABS
2.0000 g | ORAL_TABLET | Freq: Two times a day (BID) | ORAL | Status: DC
  Administered 2021-10-22 – 2022-02-26 (×256): 2 g via ORAL
  Filled 2021-10-22 (×265): qty 2

## 2021-10-22 NOTE — Progress Notes (Signed)
Patient refused morning vitals.  He want vitals taken later after he wakes up.

## 2021-10-22 NOTE — Progress Notes (Signed)
Patient seen and examined.  Please review progress note by Junious Silk, NP from 2/17 for details.  Patient asleep but easily arousable.  Denies any complaints.  Has been ambulating in the hall without difficulties.  He was told not to go off of the unit without informing the unit staff.  Vital signs stable  Lungs clear to auscultation bilaterally. S1-S2 is normal regular. Abdomen is soft.  Sodium noted to be 125 today.  Other labs are stable.  Patient with chronic hyponatremia thought to be secondary to polydipsia.  Sodium level has been stable though was noted to be lower today than what it has been over the last few days.  We will increase the dose of his salt tablets.  He is on fluid restriction.  We will recheck on Monday.  Other medical issues are stable.  Mentation is at baseline.  We will continue to follow.  Jim Dunn 10/22/2021

## 2021-10-23 NOTE — Progress Notes (Signed)
Patient seen and examined.  Please review progress note by Erin Hearing, NP from 2/17 for details.  Patient asleep but easily arousable.  No complaints offered.  Vital signs stable  Lungs clear to auscultation bilaterally. S1-S2 is normal regular. Abdomen is soft.  Sodium was noted to be 125 yesterday.    Patient with chronic hyponatremia thought to be secondary to polydipsia.  Sodium level has been stable though was noted to be lower yesterday than what it has been over the last few days.  Dose of salt tablet was increased.  Mentation is stable.  We will recheck labs tomorrow.  Continue fluid restriction.    Other medical issues are stable.   We will continue to follow.  Jim Dunn 10/23/2021

## 2021-10-23 NOTE — Progress Notes (Signed)
Mobility Specialist Progress Note:   10/23/21 1109  Mobility  Activity Ambulated independently in hallway  Level of Assistance Independent  Distance Ambulated (ft) 1000 ft  Activity Response Tolerated well  $Mobility charge 1 Mobility   [facility] Phone [phone]

## 2021-10-24 DIAGNOSIS — E871 Hypo-osmolality and hyponatremia: Secondary | ICD-10-CM | POA: Diagnosis not present

## 2021-10-24 LAB — BASIC METABOLIC PANEL
Anion gap: 7 (ref 5–15)
BUN: 13 mg/dL (ref 6–20)
CO2: 27 mmol/L (ref 22–32)
Calcium: 9 mg/dL (ref 8.9–10.3)
Chloride: 95 mmol/L — ABNORMAL LOW (ref 98–111)
Creatinine, Ser: 0.63 mg/dL (ref 0.61–1.24)
GFR, Estimated: >60 mL/min (ref >60)
Glucose, Bld: 82 mg/dL (ref 70–99)
Potassium: 4.3 mmol/L (ref 3.5–5.1)
Sodium: 129 mmol/L — ABNORMAL LOW (ref 135–145)

## 2021-10-24 LAB — DIGOXIN LEVEL: Digoxin Level: 0.3 ng/mL — ABNORMAL LOW (ref 0.8–2.0)

## 2021-10-24 NOTE — Plan of Care (Signed)
°  Problem: Education: Goal: Knowledge of General Education information will improve Description: Including pain rating scale, medication(s)/side effects and non-pharmacologic comfort measures Outcome: Progressing   Problem: Health Behavior/Discharge Planning: Goal: Ability to manage health-related needs will improve Outcome: Progressing   Problem: Clinical Measurements: Goal: Ability to maintain clinical measurements within normal limits will improve Outcome: Progressing   Problem: Education: Goal: Knowledge of General Education information will improve Description: Including pain rating scale, medication(s)/side effects and non-pharmacologic comfort measures Outcome: Progressing   Problem: Health Behavior/Discharge Planning: Goal: Ability to manage health-related needs will improve Outcome: Progressing   Problem: Clinical Measurements: Goal: Ability to maintain clinical measurements within normal limits will improve Outcome: Progressing Goal: Will remain free from infection Outcome: Progressing Goal: Diagnostic test results will improve Outcome: Progressing Goal: Respiratory complications will improve Outcome: Progressing Goal: Cardiovascular complication will be avoided Outcome: Progressing   Problem: Activity: Goal: Risk for activity intolerance will decrease Outcome: Progressing   Problem: Nutrition: Goal: Adequate nutrition will be maintained Outcome: Progressing   Problem: Coping: Goal: Level of anxiety will decrease Outcome: Progressing   Problem: Elimination: Goal: Will not experience complications related to bowel motility Outcome: Progressing Goal: Will not experience complications related to urinary retention Outcome: Progressing   Problem: Pain Managment: Goal: General experience of comfort will improve Outcome: Progressing   Problem: Safety: Goal: Ability to remain free from injury will improve Outcome: Progressing   Problem: Skin  Integrity: Goal: Risk for impaired skin integrity will decrease Outcome: Progressing   

## 2021-10-24 NOTE — Progress Notes (Signed)
Mobility Specialist Progress Note    10/24/21 1345  Mobility  Activity Ambulated independently in hallway  Level of Assistance Independent  Assistive Device None  Distance Ambulated (ft) 550 ft  Activity Response Tolerated well  $Mobility charge 1 Mobility   Pt requesting I join him for walk. No complaints. Left standing at front desk.   [facility] Mobility Specialist  M.S. 5N: [phone]

## 2021-10-24 NOTE — Progress Notes (Signed)
Progress Note   Patient: Jim Dunn PZW:[id number] DOB: [birth year] DOA: 09/24/2021     25 DOS: the patient was seen and examined on 10/24/2021   Brief hospital course: 56 year old male with history of schizophrenia, chronic hyponatremia, seizure disorder, tobacco use, systolic congestive heart failure, COPD who was brought here on 09/24/2021 to ED by his family after they were unable to take care of him.  He was drinking a lot of water and trying to burn things.  Previously he was staying at group home and was kicked out.  Neurology and psychiatry were consulted after admission.  Hospital course remarkable for acute on chronic hyponatremia.  Prolonged hospitalization.  TOC following. APS in process to determine if they will pursue guardianship  Assessment and Plan: * Chronic hyponatremia- (present on admission) Presented with symptomatic hyponatremia in context of polydipsia prior to admission-this is a recurrent problem Continue 1500 cc per 24-hour fluid restriction Na has ranged between 128 and 132 over the past several days Continue sodium tablets 2 gm PO bid.   Peripheral neuropathy Chronic issue and was present during previous admission Likely related to years of substance abuse Continue low-dose Lyrica since was effective previous  Dementia without behavioral disturbance (HCC)- (present on admission) During initial admission over 1 year ago had formal neuropsychiatric evaluation by Dr. Kieth Brightly who diagnosed patient with dementia likely related to years of schizophrenia and nonadherence to medication regimen SLUMS evaluation worsened since previous admission noting has declined from 18 to 33.  He lacks capacity to make decisions APS investigating guardianship noting no other family members or friends are willing to pursue guardianship     Paranoid schizophrenia with hallucinations- (present on admission) Appreciate psychiatric team help Schizophrenia symptoms and decompensation  worsened by recurrent hyponatremia Also has a component of anxiety and during previous admission requested antianxiety medications Continue Seroquel and Invega  Seizure disorder (HCC) Continue Depakote-valproic acid level 41 previous level on 2/10 was 0.2 History of nonadherence and valproic acid on 1/22 was 19-repeat level in a.m. EEG negative for acute seizure activity Continue seizure precaution Depakote can also function as a mood stabilizer MRI revealed arachnoid cyst that is chronic in nature  COPD (chronic obstructive pulmonary disease) (HCC)- (present on admission) Compensated Given Pneumovax immunization this admission  Mixed hyperlipidemia- (present on admission) Continue lipitor Apparently has been nonadherent with medicines prior to admission  Chronic systolic CHF (congestive heart failure) (HCC)- (present on admission) Appears euvolemic Continue entresto, digoxin and lasix.  Digoxin level subtherapeutic as of 2/10 but this also appears to be a chronic issue.  Follow periodically Strict I/O and daily weights Fluid restriction for chronic hyponatremia Last echo: 5/21 with EF of 25-30% with global hypokinesis, RV overload and indeterminate diastolic dysfunction. Does not appear that he has followed up with cardiology. Gets meds from ED.  Echocardiogram performed on 09/26/2021 EF of 30-35% with moderately decreased systolic function and global hypokinesis. RV size and systolic function is normal. Weight does not appear to be accurate noting on 2/14 he weighed 193 pounds and on 3/15 he weighed 237         Subjective:  Ambulating in hall without difficulty.  Continues to report numb feet which is a chronic issue.  Asked me if I had given him something to help with his nerves because he felt better.  Discussed that Lyrica prescribed for neuropathy also will help with anxiety.  Physical Exam: Vitals:   10/23/21 2025 10/24/21 0500 10/24/21 0536 10/24/21 0755  BP: (!)  155/77  134/90 (!) 170/106  Pulse: 91  62 70  Resp: 17  15 17   Temp: 98.8 F (37.1 C)  98 F (36.7 C) 98.3 F (36.8 C)  TempSrc: Oral  Oral Oral  SpO2: 98%  99%   Weight:  106.9 kg    Height:       Gen: No acute distress, calm  Pulm: Anterior lung sounds clear to auscultation, on room air, no increased work of breathing Card: S1-S2, normotensive, no peripheral edema Abd: Soft nontender nondistended with normal active bowel sounds.  Eating well.  LBM 2/18 Neuro: Sleeping but at baseline cranial nerves II through XII grossly intact, moves all extremities x4 without any focal neurological deficits.  Sensation intact.  Mobilizes independently Psych: Awake and alert and oriented to name and place as well as year.   A physical therapy consult is indicated based on the patients mobility assessment.   Mobility Assessment (last 72 hours)     Mobility Assessment     Row Name 10/23/21 0725 10/22/21 2230 10/22/21 0945   Does patient have an order for bedrest or is patient medically unstable No - Continue assessment No - Continue assessment No - Continue assessment   What is the highest level of mobility based on the progressive mobility assessment? Level 6 (Walks independently in room and hall) - Balance while walking in room without assist - Complete Level 6 (Walks independently in room and hall) - Balance while walking in room without assist - Complete Level 6 (Walks independently in room and hall) - Balance while walking in room without assist - Complete             Data Reviewed:  Results reviewed  Family Communication:  Patient  Disposition: Remains inpatient appropriate because:  Unsafe DC plan  Planned Discharge Destination:  Barriers to discharge: APS to determine if they will pursue guardianship. Family refusing to take pt because stating they cannot manage him noting refuses to take meds  Medically stable Yes  COVID vaccination status:  [employer]  07/20/2020  Consultants: Psychiatry Neurology Procedures: EEG Echocardiogram Antibiotics: None     Time spent: 15 minutes  Author: 07/22/2020, NP 10/24/2021 11:46 AM  For on call review [email or web address].

## 2021-10-25 DIAGNOSIS — E871 Hypo-osmolality and hyponatremia: Secondary | ICD-10-CM | POA: Diagnosis not present

## 2021-10-25 NOTE — Plan of Care (Signed)
°  Problem: Education: Goal: Knowledge of General Education information will improve Description: Including pain rating scale, medication(s)/side effects and non-pharmacologic comfort measures Outcome: Progressing   Problem: Health Behavior/Discharge Planning: Goal: Ability to manage health-related needs will improve Outcome: Progressing   Problem: Clinical Measurements: Goal: Ability to maintain clinical measurements within normal limits will improve Outcome: Progressing   Problem: Education: Goal: Knowledge of General Education information will improve Description: Including pain rating scale, medication(s)/side effects and non-pharmacologic comfort measures Outcome: Progressing   Problem: Health Behavior/Discharge Planning: Goal: Ability to manage health-related needs will improve Outcome: Progressing   Problem: Clinical Measurements: Goal: Ability to maintain clinical measurements within normal limits will improve Outcome: Progressing Goal: Will remain free from infection Outcome: Progressing Goal: Diagnostic test results will improve Outcome: Progressing Goal: Respiratory complications will improve Outcome: Progressing Goal: Cardiovascular complication will be avoided Outcome: Progressing   Problem: Activity: Goal: Risk for activity intolerance will decrease Outcome: Progressing   Problem: Nutrition: Goal: Adequate nutrition will be maintained Outcome: Progressing   Problem: Coping: Goal: Level of anxiety will decrease Outcome: Progressing   Problem: Elimination: Goal: Will not experience complications related to bowel motility Outcome: Progressing Goal: Will not experience complications related to urinary retention Outcome: Progressing   Problem: Pain Managment: Goal: General experience of comfort will improve Outcome: Progressing   Problem: Safety: Goal: Ability to remain free from injury will improve Outcome: Progressing   Problem: Skin  Integrity: Goal: Risk for impaired skin integrity will decrease Outcome: Progressing   

## 2021-10-25 NOTE — Progress Notes (Signed)
Mobility Specialist Progress Note    10/25/21 1720  Mobility  Activity Ambulated independently in hallway  Level of Assistance Independent  Assistive Device None  Distance Ambulated (ft) 1200 ft  Activity Response Tolerated well  $Mobility charge 1 Mobility   Pt received in bed and agreeable. No complaints. Pt returned to sitting EOB to eat.   [facility] Mobility Specialist  M.S. 5N: [phone]

## 2021-10-25 NOTE — Progress Notes (Signed)
Progress Note   Patient: Jim Dunn [id number]:[id number] DOB: 1965-09-26 DOA: 09/24/2021     26 DOS: the patient was seen and examined on 10/25/2021   Brief hospital course: 56 year old male with history of schizophrenia, chronic hyponatremia, seizure disorder, tobacco use, systolic congestive heart failure, COPD who was brought here on 09/24/2021 to ED by his family after they were unable to take care of him.  He was drinking a lot of water and trying to burn things.  Previously he was staying at group home and was kicked out.  Neurology and psychiatry were consulted after admission.  Hospital course remarkable for acute on chronic hyponatremia.  Prolonged hospitalization.  TOC following. APS in process to determine if they will pursue guardianship  Assessment and Plan: * Chronic hyponatremia- (present on admission) Presented with symptomatic hyponatremia in context of polydipsia prior to admission-this is a recurrent problem Continue 1500 cc per 24-hour fluid restriction Na has ranged between 128 and 132 over the past several days Continue sodium tablets 2 gm PO bid.   Peripheral neuropathy Chronic issue and was present during previous admission Likely related to years of substance abuse Continue low-dose Lyrica since was effective previous  Dementia without behavioral disturbance (HCC)- (present on admission) During initial admission over 1 year ago had formal neuropsychiatric evaluation by Dr. Kieth Brightly who diagnosed patient with dementia likely related to years of schizophrenia and nonadherence to medication regimen SLUMS evaluation worsened since previous admission noting has declined from 18 to 7.  He lacks capacity to make decisions APS investigating guardianship noting no other family members or friends are willing to pursue guardianship     Paranoid schizophrenia with hallucinations- (present on admission) Appreciate psychiatric team help Schizophrenia symptoms and decompensation  worsened by recurrent hyponatremia Also has a component of anxiety and during previous admission requested antianxiety medications Continue Seroquel and Invega  Seizure disorder (HCC) Continue Depakote-valproic acid level 41 previous level on 2/10 was 0.2 History of nonadherence and valproic acid on 1/22 was 19-repeat level in a.m. EEG negative for acute seizure activity Continue seizure precaution Depakote can also function as a mood stabilizer MRI revealed arachnoid cyst that is chronic in nature  COPD (chronic obstructive pulmonary disease) (HCC)- (present on admission) Compensated Given Pneumovax immunization this admission  Mixed hyperlipidemia- (present on admission) Continue lipitor Apparently has been nonadherent with medicines prior to admission  Chronic systolic CHF (congestive heart failure) (HCC)- (present on admission) Appears euvolemic Continue entresto, digoxin and lasix.  Digoxin level subtherapeutic as of 2/10 but this also appears to be a chronic issue.  Follow periodically Strict I/O and daily weights Fluid restriction for chronic hyponatremia Last echo: 5/21 with EF of 25-30% with global hypokinesis, RV overload and indeterminate diastolic dysfunction. Does not appear that he has followed up with cardiology. Gets meds from ED.  Echocardiogram performed on 09/26/2021 EF of 30-35% with moderately decreased systolic function and global hypokinesis. RV size and systolic function is normal. Weight does not appear to be accurate noting on 2/14 he weighed 193 pounds and on 3/15 he weighed 237         Subjective:  Sleeping soundly  Physical Exam: Vitals:   10/24/21 1636 10/24/21 2041 10/25/21 0451 10/25/21 0837  BP: (!) 165/89 140/90 (!) 155/95 (!) 190/98  Pulse: 95 83 85 77  Resp: 16 16 16 17   Temp: 97.9 F (36.6 C) 98 F (36.7 C) 98.3 F (36.8 C) 98.5 F (36.9 C)  TempSrc: Oral Oral Oral Oral  SpO2: 100% 100%  100% 100%  Weight:      Height:        Gen: No acute distress, calm and sleeping sound Pulm: Anterior lung sounds clear to auscultation, on room air, no increased work of breathing Card: S1-S2, normotensive, no peripheral edema Abd: Soft nontender nondistended with normal active bowel sounds.  Eating well.  LBM 2/20 Neuro: Sleeping but at baseline cranial nerves II through XII grossly intact, moves all extremities x4 without any focal neurological deficits.  Sensation intact.  Mobilizes independently Psych: Leaping but at baseline awake and alert and oriented to name and place as well as year.   A physical therapy consult is indicated based on the patients mobility assessment.   Mobility Assessment (last 72 hours)     Mobility Assessment     Row Name 10/24/21 2120 10/23/21 0725 10/22/21 2230   Does patient have an order for bedrest or is patient medically unstable No - Continue assessment No - Continue assessment No - Continue assessment   What is the highest level of mobility based on the progressive mobility assessment? Level 6 (Walks independently in room and hall) - Balance while walking in room without assist - Complete Level 6 (Walks independently in room and hall) - Balance while walking in room without assist - Complete Level 6 (Walks independently in room and hall) - Balance while walking in room without assist - Complete             Data Reviewed:  Results reviewed  Family Communication:  Patient  Disposition: Remains inpatient appropriate because:  Unsafe DC plan  Planned Discharge Destination:  Barriers to discharge: APS to determine if they will pursue guardianship. Family refusing to take pt because stating they cannot manage him noting refuses to take meds  Medically stable Yes  COVID vaccination status:  Pfizer 07/20/2020  Consultants: Psychiatry Neurology Procedures: EEG Echocardiogram Antibiotics: None     Time spent: 15 minutes  Author: Junious Silk, NP 10/25/2021 12:44  PM  For on call review [email or web address].

## 2021-10-25 NOTE — Progress Notes (Signed)
CSW spoke with [place] at [place] who states she will file for guardianship of the patient this week as she was unable to locate any willing family members. [place] will notify CSW once a court date is set.  Madilyn Fireman, MSW, LCSW Transitions of Care   Clinical Social Worker II [phone]

## 2021-10-26 DIAGNOSIS — E871 Hypo-osmolality and hyponatremia: Secondary | ICD-10-CM | POA: Diagnosis not present

## 2021-10-26 NOTE — Progress Notes (Signed)
Mobility Specialist Progress Note    10/26/21 0951  Mobility  Activity Ambulated independently in hallway  Level of Assistance Independent  Assistive Device None  Distance Ambulated (ft) 550 ft  Activity Response Tolerated well  $Mobility charge 1 Mobility   Pt received and requesting I join him for walk. No complaints. Left standing at front reception desk.   [facility] Mobility Specialist  M.S. 5N: [phone]

## 2021-10-26 NOTE — Plan of Care (Signed)
°  Problem: Education: Goal: Knowledge of General Education information will improve Description: Including pain rating scale, medication(s)/side effects and non-pharmacologic comfort measures Outcome: Adequate for Discharge   Problem: Health Behavior/Discharge Planning: Goal: Ability to manage health-related needs will improve Outcome: Adequate for Discharge   Problem: Clinical Measurements: Goal: Ability to maintain clinical measurements within normal limits will improve Outcome: Adequate for Discharge   Problem: Education: Goal: Knowledge of General Education information will improve Description: Including pain rating scale, medication(s)/side effects and non-pharmacologic comfort measures Outcome: Adequate for Discharge   Problem: Health Behavior/Discharge Planning: Goal: Ability to manage health-related needs will improve Outcome: Adequate for Discharge   Problem: Clinical Measurements: Goal: Ability to maintain clinical measurements within normal limits will improve Outcome: Adequate for Discharge Goal: Will remain free from infection Outcome: Adequate for Discharge Goal: Diagnostic test results will improve Outcome: Adequate for Discharge Goal: Respiratory complications will improve Outcome: Adequate for Discharge Goal: Cardiovascular complication will be avoided Outcome: Adequate for Discharge   Problem: Activity: Goal: Risk for activity intolerance will decrease Outcome: Adequate for Discharge   Problem: Nutrition: Goal: Adequate nutrition will be maintained Outcome: Adequate for Discharge   Problem: Coping: Goal: Level of anxiety will decrease Outcome: Adequate for Discharge   Problem: Elimination: Goal: Will not experience complications related to bowel motility Outcome: Adequate for Discharge Goal: Will not experience complications related to urinary retention Outcome: Adequate for Discharge   Problem: Pain Managment: Goal: General experience of comfort  will improve Outcome: Adequate for Discharge   Problem: Safety: Goal: Ability to remain free from injury will improve Outcome: Adequate for Discharge   Problem: Skin Integrity: Goal: Risk for impaired skin integrity will decrease Outcome: Adequate for Discharge

## 2021-10-26 NOTE — Progress Notes (Signed)
Progress Note   Patient: Jim Dunn ZOX:[id number] DOB: 11-Jan-1966 DOA: 09/24/2021     27 DOS: the patient was seen and examined on 10/26/2021   Brief hospital course: 56 year old male with history of schizophrenia, chronic hyponatremia, seizure disorder, tobacco use, systolic congestive heart failure, COPD who was brought here on 09/24/2021 to ED by his family after they were unable to take care of him.  He was drinking a lot of water and trying to burn things.  Previously he was staying at group home and was kicked out.  Neurology and psychiatry were consulted after admission.  Hospital course remarkable for acute on chronic hyponatremia.  Prolonged hospitalization.  TOC following. APS in process to determine if they will pursue guardianship  Assessment and Plan: * Chronic hyponatremia- (present on admission) Presented with symptomatic hyponatremia in context of polydipsia prior to admission-this is a recurrent problem Continue 1500 cc per 24-hour fluid restriction Na has ranged between 128 and 132 over the past several days Continue sodium tablets 2 gm PO bid.   Peripheral neuropathy Chronic issue and was present during previous admission Likely related to years of substance abuse Continue low-dose Lyrica since was effective previous  Dementia without behavioral disturbance (HCC)- (present on admission) During initial admission over 1 year ago had formal neuropsychiatric evaluation by Dr. Kieth Brightly who diagnosed patient with dementia likely related to years of schizophrenia and nonadherence to medication regimen SLUMS evaluation worsened since previous admission noting has declined from 18 to 23.  He lacks capacity to make decisions APS investigating guardianship noting no other family members or friends are willing to pursue guardianship     Paranoid schizophrenia with hallucinations- (present on admission) Appreciate psychiatric team help Schizophrenia symptoms and decompensation  worsened by recurrent hyponatremia Also has a component of anxiety and during previous admission requested antianxiety medications Continue Seroquel and Invega  Seizure disorder (HCC) Continue Depakote-valproic acid level 41 previous level on 2/10 was 0.2 History of nonadherence and valproic acid on 1/22 was 19-repeat level in a.m. EEG negative for acute seizure activity Continue seizure precaution Depakote can also function as a mood stabilizer MRI revealed arachnoid cyst that is chronic in nature  COPD (chronic obstructive pulmonary disease) (HCC)- (present on admission) Compensated Given Pneumovax immunization this admission  Mixed hyperlipidemia- (present on admission) Continue lipitor Apparently has been nonadherent with medicines prior to admission  Chronic systolic CHF (congestive heart failure) (HCC)- (present on admission) Appears euvolemic Continue entresto, digoxin and lasix.  Digoxin level subtherapeutic as of 2/10 but this also appears to be a chronic issue.  Follow periodically Strict I/O and daily weights Fluid restriction for chronic hyponatremia Last echo: 5/21 with EF of 25-30% with global hypokinesis, RV overload and indeterminate diastolic dysfunction. Does not appear that he has followed up with cardiology. Gets meds from ED.  Echocardiogram performed on 09/26/2021 EF of 30-35% with moderately decreased systolic function and global hypokinesis. RV size and systolic function is normal. Weight does not appear to be accurate noting on 2/14 he weighed 193 pounds and on 3/15 he weighed 237         Subjective:  Awake without any complaints.  Made aware that APS is formally pursuing guardianship after determining no candidates from patient's friends or family  Physical Exam: Vitals:   10/24/21 2041 10/25/21 0451 10/25/21 0837 10/25/21 2100  BP: 140/90 (!) 155/95 (!) 190/98 (!) 155/104  Pulse: 83 85 77 88  Resp: 16 16 17 19   Temp: 98 F (36.7 C) 98.3 F (  36.8  C) 98.5 F (36.9 C) 98 F (36.7 C)  TempSrc: Oral Oral Oral Oral  SpO2: 100% 100% 100% 98%  Weight:      Height:       Gen: No acute distress, calm and sleeping sound Pulm: Anterior lung sounds clear to auscultation, on room air, no increased work of breathing Card: S1-S2, normotensive, no peripheral edema Abd: Soft nontender nondistended with normal active bowel sounds.  Eating well.  LBM 2/21 Neuro: Sleeping but at baseline cranial nerves II through XII grossly intact, moves all extremities x4 without any focal neurological deficits.  Sensation intact.  Mobilizes independently Psych: Alert and oriented x3 but still lacks insight into mental disabilities that prevent patient from managing care safely and independently in the home environment   A physical therapy consult is indicated based on the patients mobility assessment.   Mobility Assessment (last 72 hours)     Mobility Assessment     Row Name 10/25/21 2100 10/24/21 2120     Does patient have an order for bedrest or is patient medically unstable No - Continue assessment No - Continue assessment    What is the highest level of mobility based on the progressive mobility assessment? Level 6 (Walks independently in room and hall) - Balance while walking in room without assist - Complete Level 6 (Walks independently in room and hall) - Balance while walking in room without assist - Complete              Data Reviewed:  Results reviewed  Family Communication:  Patient  Disposition: Remains inpatient appropriate because:  Unsafe DC plan  Planned Discharge Destination:  Barriers to discharge: APS to determine if they will pursue guardianship. Family refusing to take pt because stating they cannot manage him noting refuses to take meds  Medically stable Yes  COVID vaccination status:  Pf[employer] 07/20/2020  Consultants: Psychiatry Neurology Procedures: EEG Echocardiogram Antibiotics: None     Time spent:  15 minutes  Author: Junious Silk, NP 10/26/2021 1:22 PM  For on call review [email or web address].

## 2021-10-27 DIAGNOSIS — K5900 Constipation, unspecified: Secondary | ICD-10-CM

## 2021-10-27 DIAGNOSIS — E871 Hypo-osmolality and hyponatremia: Secondary | ICD-10-CM | POA: Diagnosis not present

## 2021-10-27 LAB — RAPID URINE DRUG SCREEN, HOSP PERFORMED
Amphetamines: NOT DETECTED
Barbiturates: NOT DETECTED
Benzodiazepines: POSITIVE — AB
Cocaine: NOT DETECTED
Opiates: NOT DETECTED
Tetrahydrocannabinol: NOT DETECTED

## 2021-10-27 MED ORDER — MAGNESIUM HYDROXIDE 400 MG/5ML PO SUSP
30.0000 mL | Freq: Once | ORAL | Status: AC
  Administered 2021-10-27: 30 mL via ORAL
  Filled 2021-10-27 (×2): qty 30

## 2021-10-27 MED ORDER — HYDROXYZINE HCL 10 MG PO TABS
10.0000 mg | ORAL_TABLET | ORAL | Status: DC | PRN
  Administered 2021-10-28 – 2021-11-02 (×9): 10 mg via ORAL
  Filled 2021-10-27 (×12): qty 1

## 2021-10-27 NOTE — Progress Notes (Signed)
Progress Note   Patient: Jim Dunn [id number]:[id number] DOB: [birth year] DOA: 09/24/2021     28 DOS: the patient was seen and examined on 10/27/2021   Brief hospital course: 56 year old male with history of schizophrenia, chronic hyponatremia, seizure disorder, tobacco use, systolic congestive heart failure, COPD who was brought here on 09/24/2021 to ED by his family after they were unable to take care of him.  He was drinking a lot of water and trying to burn things.  Previously he was staying at group home and was kicked out.  Neurology and psychiatry were consulted after admission.  Hospital course remarkable for acute on chronic hyponatremia.  Prolonged hospitalization.  TOC following. APS in process to determine if they will pursue guardianship  Assessment and Plan: * Chronic hyponatremia- (present on admission) Presented with symptomatic hyponatremia in context of polydipsia prior to admission-this is a recurrent problem Continue 1500 cc per 24-hour fluid restriction Continue sodium tablets 2 gm PO bid.   Seizure disorder (HCC) Continue Depakote-valproic acid level 41 previous level on 2/10 was 0.2 History of nonadherence and valproic acid on 1/22 was 19-repeat level in a.m. EEG negative for acute seizure activity Continue seizure precaution Depakote can also function as a mood stabilizer MRI revealed arachnoid cyst that is chronic in nature  Paranoid schizophrenia with hallucinations- (present on admission) Appreciate psychiatric team help Schizophrenia symptoms and decompensation worsened by recurrent hyponatremia Also has a component of anxiety and during previous admission requested antianxiety medications Continue Seroquel and Invega  Dementia without behavioral disturbance (HCC)- (present on admission) During initial admission over 1 year ago had formal neuropsychiatric evaluation by Dr. Kieth Brightly who diagnosed patient with dementia likely related to years of schizophrenia and  nonadherence to medication regimen SLUMS evaluation worsened since previous admission noting has declined from 36 to 57.  He lacks capacity to make decisions APS investigating guardianship noting no other family members or friends are willing to pursue guardianship     Chronic systolic CHF (congestive heart failure) (HCC)- (present on admission) Appears euvolemic Continue entresto, digoxin and lasix.  Digoxin level subtherapeutic as of 2/10 but this also appears to be a chronic issue.  Follow periodically Strict I/O and daily weights Fluid restriction for chronic hyponatremia Last echo: 5/21 with EF of 25-30% with global hypokinesis, RV overload and indeterminate diastolic dysfunction. Does not appear that he has followed up with cardiology. Gets meds from ED.  Echocardiogram performed on 09/26/2021 EF of 30-35% with moderately decreased systolic function and global hypokinesis. RV size and systolic function is normal. Weight does not appear to be accurate noting on 2/14 he weighed 193 pounds and on 3/15 he weighed 237   Constipation No bowel movement for several days Nursing states patient has been refusing preventative laxatives-as of 2/23 patient is agreeing to take stool softeners and laxative Give one-time dose of milk of magnesia to promote bowel movement  Peripheral neuropathy Chronic issue and was present during previous admission Likely related to years of substance abuse Continue low-dose Lyrica since was effective previous  Mixed hyperlipidemia- (present on admission) Continue lipitor Apparently has been nonadherent with medicines prior to admission  COPD (chronic obstructive pulmonary disease) (HCC)- (present on admission) Compensated Given Pneumovax immunization this admission        Subjective:  Discussed with patient parameters to continue privileges to leave unit.  Nursing staff updated need to place written instructions and parameters at patient's bedside for  continual review especially with short-term memory deficits.  Parameters are as follows: 1)  staff must be notified when he leaves the unit 2) he is not allowed to be off the unit for more than 30-minute 3) he is not allowed to leave hospital property  Physical Exam: Vitals:   10/25/21 2100 10/26/21 1548 10/26/21 2011 10/27/21 0528  BP: (!) 155/104 (!) 148/84 (!) 164/92 128/73  Pulse: 88 84 91 96  Resp: 19 19 18 17   Temp: 98 F (36.7 C) 98.5 F (36.9 C) 98.3 F (36.8 C)   TempSrc: Oral  Oral   SpO2: 98% 98% 96% 100%  Weight:      Height:       Gen: No acute distress, calm  Pulm: Anterior lung sounds clear to auscultation, on room air, no increased work of breathing Card: S1-S2, normotensive, no peripheral edema Abd: Soft nontender nondistended with normal active bowel sounds.  Eating well.  LBM 2/21 Neuro: Cranial nerves II through XII grossly intact, moves all extremities x4 without any focal neurological deficits.  Sensation intact.  Mobilizes independently Psych: Alert and oriented x3 but still lacks insight into mental disabilities that prevent patient from managing care safely and independently in the home environment   A physical therapy consult is indicated based on the patients mobility assessment.   Mobility Assessment (last 72 hours)     Mobility Assessment     Row Name 10/26/21 2011 10/25/21 2100 10/24/21 2120   Does patient have an order for bedrest or is patient medically unstable No - Continue assessment No - Continue assessment No - Continue assessment   What is the highest level of mobility based on the progressive mobility assessment? Level 6 (Walks independently in room and hall) - Balance while walking in room without assist - Complete Level 6 (Walks independently in room and hall) - Balance while walking in room without assist - Complete Level 6 (Walks independently in room and hall) - Balance while walking in room without assist - Complete              Data Reviewed:  Results reviewed  Family Communication:  Patient  Disposition: Remains inpatient appropriate because:  Unsafe DC plan  Planned Discharge Destination:  Barriers to discharge: APS to determine if they will pursue guardianship. Family refusing to take pt because stating they cannot manage him noting refuses to take meds  Medically stable Yes  COVID vaccination status:  [employer] 07/20/2020  Consultants: Psychiatry Neurology Procedures: EEG Echocardiogram Antibiotics: None     Time spent: 15 minutes  Author: 07/22/2020, NP 10/27/2021 11:00 AM  For on call review [email or web address].

## 2021-10-27 NOTE — Assessment & Plan Note (Signed)
No bowel movement for several days Nursing states patient has been refusing preventative laxatives-as of 2/23 patient is agreeing to take stool softeners and laxative Give one-time dose of milk of magnesia to promote bowel movement

## 2021-10-27 NOTE — Progress Notes (Signed)
Pt was non-compliant and left the unit without supervision to smoke around 7:15pm. He returned to his room with smoke odor. About 10:15pm, we started smelling strong smoke odor from outside the room ( in the hallway) and it has a smell of marijuana or cigarette. Notified Gershon Cull MD, Vernon Prey nurse and [facility].  We did a thorough search of the patient and the entire room. We found ashes in the toilet, 2 empty cigarette boxes, 2 cigars, 2 lighters and 1 e-cigarette. New orders placed and will continue to monitor pt.

## 2021-10-27 NOTE — Plan of Care (Signed)
°  Problem: Education: Goal: Knowledge of General Education information will improve Description: Including pain rating scale, medication(s)/side effects and non-pharmacologic comfort measures Outcome: Progressing   Problem: Health Behavior/Discharge Planning: Goal: Ability to manage health-related needs will improve Outcome: Progressing   Problem: Clinical Measurements: Goal: Ability to maintain clinical measurements within normal limits will improve Outcome: Progressing   Problem: Education: Goal: Knowledge of General Education information will improve Description: Including pain rating scale, medication(s)/side effects and non-pharmacologic comfort measures Outcome: Progressing   Problem: Health Behavior/Discharge Planning: Goal: Ability to manage health-related needs will improve Outcome: Progressing   Problem: Clinical Measurements: Goal: Ability to maintain clinical measurements within normal limits will improve Outcome: Progressing Goal: Will remain free from infection Outcome: Progressing Goal: Diagnostic test results will improve Outcome: Progressing Goal: Respiratory complications will improve Outcome: Progressing Goal: Cardiovascular complication will be avoided Outcome: Progressing   Problem: Activity: Goal: Risk for activity intolerance will decrease Outcome: Progressing   Problem: Nutrition: Goal: Adequate nutrition will be maintained Outcome: Progressing   Problem: Coping: Goal: Level of anxiety will decrease Outcome: Progressing   Problem: Elimination: Goal: Will not experience complications related to bowel motility Outcome: Progressing Goal: Will not experience complications related to urinary retention Outcome: Progressing   Problem: Pain Managment: Goal: General experience of comfort will improve Outcome: Progressing   Problem: Safety: Goal: Ability to remain free from injury will improve Outcome: Progressing   Problem: Skin  Integrity: Goal: Risk for impaired skin integrity will decrease Outcome: Progressing   

## 2021-10-27 NOTE — Progress Notes (Addendum)
CSW spoke with NP to discuss patient's active order for out of hospital privileges that allow him to be able to go outside.   CSW spoke with [place] at APS to discuss last night's event where patient was outside for ~3 hours. Charlcie Cradle states she will file for guardianship of the patient today and will provide CSW with the court date once it is available.   [name], MSW, LCSW Transitions of Care   Clinical Social Worker II [phone]

## 2021-10-27 NOTE — Progress Notes (Signed)
° ° °  OVERNIGHT PROGRESS REPORT Notified by RN that patient had left his room and potentially the hospital floor. Patient returned to room and staff noticed what was described as a "strong smell of smoke from outside the room." A subsequent inquiry of the patient yielded some ashes and discarded smoking materials of various types. UDS ordered (pending) and sitter ordered.   [facility] (main hosp) AC alerted of this by assigned nursing staff.    Gershon Cull MSNA MSN ACNPC-AG Acute Care Nurse Practitioner [place]

## 2021-10-28 DIAGNOSIS — E871 Hypo-osmolality and hyponatremia: Secondary | ICD-10-CM | POA: Diagnosis not present

## 2021-10-28 LAB — CBC WITH DIFFERENTIAL/PLATELET
Abs Immature Granulocytes: 0.02 10*3/uL (ref 0.00–0.07)
Basophils Absolute: 0 10*3/uL (ref 0.0–0.1)
Basophils Relative: 0 %
Eosinophils Absolute: 0.2 10*3/uL (ref 0.0–0.5)
Eosinophils Relative: 2 %
HCT: 37.8 % — ABNORMAL LOW (ref 39.0–52.0)
Hemoglobin: 12.7 g/dL — ABNORMAL LOW (ref 13.0–17.0)
Immature Granulocytes: 0 %
Lymphocytes Relative: 44 %
Lymphs Abs: 3 10*3/uL (ref 0.7–4.0)
MCH: 23.3 pg — ABNORMAL LOW (ref 26.0–34.0)
MCHC: 33.6 g/dL (ref 30.0–36.0)
MCV: 69.4 fL — ABNORMAL LOW (ref 80.0–100.0)
Monocytes Absolute: 0.8 10*3/uL (ref 0.1–1.0)
Monocytes Relative: 12 %
Neutro Abs: 2.8 10*3/uL (ref 1.7–7.7)
Neutrophils Relative %: 42 %
Platelets: 171 10*3/uL (ref 150–400)
RBC: 5.45 MIL/uL (ref 4.22–5.81)
RDW: 16.6 % — ABNORMAL HIGH (ref 11.5–15.5)
WBC: 6.8 10*3/uL (ref 4.0–10.5)
nRBC: 0 % (ref 0.0–0.2)

## 2021-10-28 NOTE — Progress Notes (Signed)
CSW spoke with [place] at APS who states the patient's mother is current financial payee and is income is ~$1,500 monthly.  CSW will attempt to locate a group home that can accommodate the patient's needs for long term placement.  [name], MSW, LCSW Transitions of Care   Clinical Social Worker II [phone]

## 2021-10-28 NOTE — Progress Notes (Signed)
Progress Note   Patient: Jim Dunn [id number]:[id number] DOB: 1966/01/14 DOA: 09/24/2021     29 DOS: the patient was seen and examined on 10/28/2021   Brief hospital course: 56 year old male with history of schizophrenia, chronic hyponatremia, seizure disorder, tobacco use, systolic congestive heart failure, COPD who was brought here on 09/24/2021 to ED by his family after they were unable to take care of him.  He was drinking a lot of water and trying to burn things.  Previously he was staying at group home and was kicked out.  Neurology and psychiatry were consulted after admission.  Hospital course remarkable for acute on chronic hyponatremia.  Prolonged hospitalization.  TOC following. APS in process to determine if they will pursue guardianship  Assessment and Plan: * Chronic hyponatremia- (present on admission) Presented with symptomatic hyponatremia in context of polydipsia prior to admission-this is a recurrent problem Continue 1500 cc per 24-hour fluid restriction Continue sodium tablets 2 gm PO bid.  12/20 sodium 129  Seizure disorder (HCC) Continue Depakote-valproic acid level 41 previous level on 2/10 was 0.2 History of nonadherence and valproic acid on 1/22 was 19-repeat level in a.m. EEG negative for acute seizure activity Continue seizure precaution Depakote can also function as a mood stabilizer MRI revealed arachnoid cyst that is chronic in nature  Paranoid schizophrenia with hallucinations- (present on admission) Appreciate psychiatric team help Schizophrenia symptoms and decompensation worsened by recurrent hyponatremia Also has a component of anxiety and during previous admission requested antianxiety medications Continue Seroquel and Invega  Dementia without behavioral disturbance (HCC)- (present on admission) During initial admission over 1 year ago had formal neuropsychiatric evaluation by Dr. Kieth Brightly who diagnosed patient with dementia likely related to years of  schizophrenia and nonadherence to medication regimen SLUMS evaluation worsened since previous admission noting has declined from 61 to 78.  He lacks capacity to make decisions APS investigating guardianship noting no other family members or friends are willing to pursue guardianship     Chronic systolic CHF (congestive heart failure) (HCC)- (present on admission) Appears euvolemic Continue entresto, digoxin and lasix.  Digoxin level subtherapeutic as of 2/10 but this also appears to be a chronic issue.  Follow periodically Strict I/O and daily weights Fluid restriction for chronic hyponatremia Last echo: 5/21 with EF of 25-30% with global hypokinesis, RV overload and indeterminate diastolic dysfunction. Does not appear that he has followed up with cardiology. Gets meds from ED.  Echocardiogram performed on 09/26/2021 EF of 30-35% with moderately decreased systolic function and global hypokinesis. RV size and systolic function is normal. Weight does not appear to be accurate noting on 2/14 he weighed 193 pounds and on 3/15 he weighed 237   Constipation No bowel movement for several days Nursing states patient has been refusing preventative laxatives-as of 2/23 patient is agreeing to take stool softeners and laxative Give one-time dose of milk of magnesia to promote bowel movement  Peripheral neuropathy Chronic issue and was present during previous admission Likely related to years of substance abuse Continue low-dose Lyrica since was effective previous  Mixed hyperlipidemia- (present on admission) Continue lipitor Apparently has been nonadherent with medicines prior to admission  COPD (chronic obstructive pulmonary disease) (HCC)- (present on admission) Compensated Given Pneumovax immunization this admission        Subjective:  Patient alert and walking through the unit.  No complaints.  Reviewed rules for off unit privileges and made him aware if he continue to not abide by  the rules that off unit privileges would  be revoked.  Off unit privileges orders updated to reflect the rules.    Parameters for off unit privileges are as follows: 1) staff must be notified when he leaves the unit 2) he is not allowed to be off the unit for more than 30-minute 3) he is not allowed to leave hospital property  Physical Exam: Vitals:   10/27/21 1156 10/27/21 1519 10/27/21 2103 10/28/21 0907  BP: (!) 151/97 (!) 146/70 (!) 154/94 (!) 166/108  Pulse: 90 100 75 84  Resp: 16 19 18 17   Temp: 98.1 F (36.7 C) 98.5 F (36.9 C) 98.1 F (36.7 C) 98.4 F (36.9 C)  TempSrc: Oral  Oral Oral  SpO2: 100% 95% 98% 98%  Weight:      Height:       Gen: No acute distress, calm  Pulm: Anterior lung sounds clear to auscultation, on room air, no increased work of breathing Card: S1-S2, normotensive, no peripheral edema Abd: Soft nontender nondistended with normal active bowel sounds.  Eating well.  LBM 2/21 Neuro: Cranial nerves II through XII grossly intact, moves all extremities x4 without any focal neurological deficits.  Sensation intact.  Mobilizes independently Psych: Alert and oriented x3 but still lacks insight into mental disabilities that prevent patient from managing care safely and independently in the home environment   A physical therapy consult is indicated based on the patients mobility assessment.   Mobility Assessment (most recent)     Mobility Assessment - 10/28/21 0800     Does patient have an order for bedrest or is patient medically unstable No - Continue assessment  What is the highest level of mobility based on the progressive mobility assessment? Level 6 (Walks independently in room and hall) - Balance while walking in room without assist - Complete              Data Reviewed:  Results reviewed  Family Communication:  Patient  Disposition: Remains inpatient appropriate because:  Unsafe DC plan  Planned Discharge Destination:  Barriers to  discharge: APS to determine if they will pursue guardianship. Family refusing to take pt because stating they cannot manage him noting refuses to take meds  Medically stable Yes  COVID vaccination status:  Pfizer 07/20/2020  Consultants: Psychiatry Neurology Procedures: EEG Echocardiogram Antibiotics: None     Time spent: 15 minutes  Author: 07/22/2020, NP 10/28/2021 12:49 PM  For on call review [email or web address].

## 2021-10-28 NOTE — Plan of Care (Signed)
°  Problem: Education: Goal: Knowledge of General Education information will improve Description: Including pain rating scale, medication(s)/side effects and non-pharmacologic comfort measures Outcome: Progressing   Problem: Education: Goal: Knowledge of General Education information will improve Description: Including pain rating scale, medication(s)/side effects and non-pharmacologic comfort measures Outcome: Progressing   Problem: Health Behavior/Discharge Planning: Goal: Ability to manage health-related needs will improve Outcome: Progressing   Problem: Coping: Goal: Level of anxiety will decrease Outcome: Progressing   Problem: Safety: Goal: Ability to remain free from injury will improve Outcome: Progressing

## 2021-10-29 DIAGNOSIS — E871 Hypo-osmolality and hyponatremia: Secondary | ICD-10-CM | POA: Diagnosis not present

## 2021-10-29 LAB — COMPREHENSIVE METABOLIC PANEL
ALT: 11 U/L (ref 0–44)
AST: 17 U/L (ref 15–41)
Albumin: 3.9 g/dL (ref 3.5–5.0)
Alkaline Phosphatase: 66 U/L (ref 38–126)
Anion gap: 9 (ref 5–15)
BUN: 9 mg/dL (ref 6–20)
CO2: 24 mmol/L (ref 22–32)
Calcium: 9.2 mg/dL (ref 8.9–10.3)
Chloride: 100 mmol/L (ref 98–111)
Creatinine, Ser: 0.63 mg/dL (ref 0.61–1.24)
GFR, Estimated: >60 mL/min (ref >60)
Glucose, Bld: 90 mg/dL (ref 70–99)
Potassium: 4 mmol/L (ref 3.5–5.1)
Sodium: 133 mmol/L — ABNORMAL LOW (ref 135–145)
Total Bilirubin: 0.6 mg/dL (ref 0.3–1.2)
Total Protein: 6.7 g/dL (ref 6.5–8.1)

## 2021-10-29 MED ORDER — CARVEDILOL 3.125 MG PO TABS
3.1250 mg | ORAL_TABLET | Freq: Two times a day (BID) | ORAL | Status: DC
Start: 2021-10-29 — End: 2021-11-19
  Administered 2021-10-29 – 2021-11-19 (×41): 3.125 mg via ORAL
  Filled 2021-10-29 (×41): qty 1

## 2021-10-29 MED ORDER — HYDRALAZINE HCL 20 MG/ML IJ SOLN
10.0000 mg | Freq: Four times a day (QID) | INTRAMUSCULAR | Status: DC | PRN
Start: 2021-10-29 — End: 2021-11-17
  Administered 2021-10-30 – 2021-11-04 (×3): 10 mg via INTRAVENOUS
  Filled 2021-10-29 (×3): qty 1

## 2021-10-29 NOTE — Plan of Care (Signed)
°  Problem: Education: Goal: Knowledge of General Education information will improve Description: Including pain rating scale, medication(s)/side effects and non-pharmacologic comfort measures Outcome: Progressing   Problem: Health Behavior/Discharge Planning: Goal: Ability to manage health-related needs will improve Outcome: Progressing   Problem: Education: Goal: Knowledge of General Education information will improve Description: Including pain rating scale, medication(s)/side effects and non-pharmacologic comfort measures Outcome: Progressing   Problem: Safety: Goal: Ability to remain free from injury will improve Outcome: Progressing

## 2021-10-29 NOTE — Progress Notes (Addendum)
Progress Note   Patient: Jim Dunn [id number] DOB: 11-28-1965 DOA: 09/24/2021     30 DOS: the patient was seen and examined on 10/29/2021   Brief hospital course: 56 year old male with history of schizophrenia, chronic hyponatremia, seizure disorder, tobacco use, systolic congestive heart failure, COPD who was brought here on 09/24/2021 to ED by his family after they were unable to take care of him.  He was drinking a lot of water and trying to burn things.  Previously he was staying at group home and was kicked out.  Neurology and psychiatry were consulted after admission.  Hospital course remarkable for acute on chronic hyponatremia.  Prolonged hospitalization.  TOC following. APS in process to determine if they will pursue guardianship  Assessment and Plan: * Chronic hyponatremia- (present on admission) Presented with symptomatic hyponatremia in context of polydipsia prior to admission-this is a recurrent problem Continue 1500 cc per 24-hour fluid restriction Continue sodium tablets 2 gm PO bid.  12/20 sodium 129  Constipation No bowel movement for several days Nursing states patient has been refusing preventative laxatives-as of 2/23 patient is agreeing to take stool softeners and laxative Give one-time dose of milk of magnesia to promote bowel movement  Peripheral neuropathy Chronic issue and was present during previous admission Likely related to years of substance abuse Continue low-dose Lyrica since was effective previous  Dementia without behavioral disturbance ([place])- (present on admission) During initial admission over 1 year ago had formal neuropsychiatric evaluation by Dr. Sima Matas who diagnosed patient with dementia likely related to years of schizophrenia and nonadherence to medication regimen SLUMS evaluation worsened since previous admission noting has declined from 34 to 78.  He lacks capacity to make decisions APS investigating guardianship noting no other family  members or friends are willing to pursue guardianship     Paranoid schizophrenia with hallucinations- (present on admission) Appreciate psychiatric team help Schizophrenia symptoms and decompensation worsened by recurrent hyponatremia Also has a component of anxiety and during previous admission requested antianxiety medications Continue Seroquel and Invega  Seizure disorder ([place]) Continue Depakote-valproic acid level 41 previous level on 2/10 was 0.2 History of nonadherence and valproic acid on 1/22 was 19-repeat level in a.m. EEG negative for acute seizure activity Continue seizure precaution Depakote can also function as a mood stabilizer MRI revealed arachnoid cyst that is chronic in nature  COPD (chronic obstructive pulmonary disease) ([place])- (present on admission) Compensated Given Pneumovax immunization this admission  Mixed hyperlipidemia- (present on admission) Continue lipitor Apparently has been nonadherent with medicines prior to admission  Chronic systolic CHF (congestive heart failure) ([place])- (present on admission) Appears euvolemic Echocardiogram performed on 09/26/2021 EF of 30-35% with moderately decreased systolic function and global hypokinesis. RV size and systolic function is normal. Continue entresto, digoxin and lasix.  Blood pressure running elevated.  Heart rate in 90s.  Start on Coreg 3.125 mg twice daily and hydralazine IV as needed. Digoxin level subtherapeutic as of 2/10 but this also appears to be a chronic issue.  Follow periodically Strict I/O and daily weights Fluid restriction for chronic hyponatremia Weight does not appear to be accurate noting on 2/14 he weighed 193 pounds and on 3/15 he weighed 237      Subjective:  Patient alert and walking through the unit.  No complaints.  Not in distress. blood pressure running elevated.  Meds adjusted  Parameters for off unit privileges are as follows: 1) staff must be notified when he leaves the  unit 2) he is not allowed to be  off the unit for more than 30-minute 3) he is not allowed to leave hospital property  Physical Exam: Vitals:   10/28/21 1711 10/28/21 2113 10/29/21 0347 10/29/21 0846  BP: (!) 161/109 (!) 170/111  (!) 182/100  Pulse: (!) 105 89  85  Resp: 17 17  16   Temp: 99 F (37.2 C) 98.1 F (36.7 C)  98.3 F (36.8 C)  TempSrc: Oral Oral  Oral  SpO2: 99% 100%  97%  Weight:   95.1 kg   Height:       Gen: No acute distress, calm  Pulm: Anterior lung sounds clear to auscultation, on room air, no increased work of breathing Card: S1-S2, normotensive, no peripheral edema Abd: Soft nontender nondistended with normal active bowel sounds.  Eating well.  LBM 2/21 Neuro: Cranial nerves II through XII grossly intact, moves all extremities x4 without any focal neurological deficits.  Sensation intact.  Mobilizes independently Psych: Alert and oriented x3 but still lacks insight into mental disabilities that prevent patient from managing care safely and independently in the home environment   A physical therapy consult is indicated based on the patients mobility assessment.   Mobility Assessment (most recent)     Mobility Assessment - 10/29/21 0808     Does patient have an order for bedrest or is patient medically unstable No - Continue assessment  What is the highest level of mobility based on the progressive mobility assessment? Level 5 (Walks with assist in room/hall) - Balance while stepping forward/back and can walk in room with assist - Complete              Data Reviewed:  Results reviewed  Family Communication:  Patient  Disposition: Remains inpatient appropriate because:  Unsafe DC plan  Planned Discharge Destination:  Barriers to discharge: APS to determine if they will pursue guardianship. Family refusing to take pt because stating they cannot manage him noting refuses to take meds  Medically stable Yes  COVID vaccination status:  [place]  07/20/2020  Consultants: Psychiatry Neurology Procedures: EEG Echocardiogram Antibiotics: None    Time spent: 15 minutes  Author: Terrilee Croak, MD 10/29/2021 3:48 PM  For on call review [email or web address].

## 2021-10-30 ENCOUNTER — Inpatient Hospital Stay (HOSPITAL_COMMUNITY): Payer: Medicare PPO

## 2021-10-30 DIAGNOSIS — K5900 Constipation, unspecified: Secondary | ICD-10-CM | POA: Diagnosis not present

## 2021-10-30 DIAGNOSIS — U071 COVID-19: Secondary | ICD-10-CM | POA: Diagnosis not present

## 2021-10-30 DIAGNOSIS — I5022 Chronic systolic (congestive) heart failure: Secondary | ICD-10-CM

## 2021-10-30 DIAGNOSIS — E222 Syndrome of inappropriate secretion of antidiuretic hormone: Secondary | ICD-10-CM | POA: Diagnosis not present

## 2021-10-30 DIAGNOSIS — Z20822 Contact with and (suspected) exposure to covid-19: Secondary | ICD-10-CM | POA: Diagnosis not present

## 2021-10-30 DIAGNOSIS — Z23 Encounter for immunization: Secondary | ICD-10-CM | POA: Diagnosis not present

## 2021-10-30 DIAGNOSIS — E871 Hypo-osmolality and hyponatremia: Secondary | ICD-10-CM | POA: Diagnosis not present

## 2021-10-30 LAB — LACTIC ACID, PLASMA
Lactic Acid, Venous: >9 mmol/L (ref 0.5–1.9)
Lactic Acid, Venous: 1.5 mmol/L (ref 0.5–1.9)

## 2021-10-30 LAB — COMPREHENSIVE METABOLIC PANEL
ALT: 18 U/L (ref 0–44)
AST: 84 U/L — ABNORMAL HIGH (ref 15–41)
Albumin: 4.2 g/dL (ref 3.5–5.0)
Alkaline Phosphatase: 80 U/L (ref 38–126)
Anion gap: 23 — ABNORMAL HIGH (ref 5–15)
BUN: 8 mg/dL (ref 6–20)
CO2: 12 mmol/L — ABNORMAL LOW (ref 22–32)
Calcium: 9.2 mg/dL (ref 8.9–10.3)
Chloride: 82 mmol/L — ABNORMAL LOW (ref 98–111)
Creatinine, Ser: 0.89 mg/dL (ref 0.61–1.24)
GFR, Estimated: >60 mL/min (ref >60)
Glucose, Bld: 114 mg/dL — ABNORMAL HIGH (ref 70–99)
Potassium: 4.4 mmol/L (ref 3.5–5.1)
Sodium: 117 mmol/L — CL (ref 135–145)
Total Bilirubin: 1.4 mg/dL — ABNORMAL HIGH (ref 0.3–1.2)
Total Protein: 7.5 g/dL (ref 6.5–8.1)

## 2021-10-30 LAB — URINALYSIS, COMPLETE (UACMP) WITH MICROSCOPIC
Bacteria, UA: NONE SEEN
Bilirubin Urine: NEGATIVE
Glucose, UA: NEGATIVE mg/dL
Hgb urine dipstick: NEGATIVE
Ketones, ur: 5 mg/dL — AB
Leukocytes,Ua: NEGATIVE
Nitrite: NEGATIVE
Protein, ur: 30 mg/dL — AB
Specific Gravity, Urine: 1.009 (ref 1.005–1.030)
pH: 6 (ref 5.0–8.0)

## 2021-10-30 LAB — BLOOD GAS, VENOUS
Acid-base deficit: 9.3 mmol/L — ABNORMAL HIGH (ref 0.0–2.0)
Bicarbonate: 15.5 mmol/L — ABNORMAL LOW (ref 20.0–28.0)
FIO2: 32 %
O2 Saturation: 98 %
Patient temperature: 37.1
pCO2, Ven: 30 mmHg — ABNORMAL LOW (ref 44–60)
pH, Ven: 7.32 (ref 7.25–7.43)
pO2, Ven: 97 mmHg — ABNORMAL HIGH (ref 32–45)

## 2021-10-30 LAB — CBC WITH DIFFERENTIAL/PLATELET
Abs Immature Granulocytes: 0.08 10*3/uL — ABNORMAL HIGH (ref 0.00–0.07)
Basophils Absolute: 0 10*3/uL (ref 0.0–0.1)
Basophils Relative: 0 %
Eosinophils Absolute: 0.2 10*3/uL (ref 0.0–0.5)
Eosinophils Relative: 2 %
HCT: 43.4 % (ref 39.0–52.0)
Hemoglobin: 14.7 g/dL (ref 13.0–17.0)
Immature Granulocytes: 1 %
Lymphocytes Relative: 39 %
Lymphs Abs: 5 10*3/uL — ABNORMAL HIGH (ref 0.7–4.0)
MCH: 23 pg — ABNORMAL LOW (ref 26.0–34.0)
MCHC: 33.9 g/dL (ref 30.0–36.0)
MCV: 67.8 fL — ABNORMAL LOW (ref 80.0–100.0)
Monocytes Absolute: 1.7 10*3/uL — ABNORMAL HIGH (ref 0.1–1.0)
Monocytes Relative: 13 %
Neutro Abs: 5.8 10*3/uL (ref 1.7–7.7)
Neutrophils Relative %: 45 %
Platelets: 204 10*3/uL (ref 150–400)
RBC: 6.4 MIL/uL — ABNORMAL HIGH (ref 4.22–5.81)
RDW: 15.5 % (ref 11.5–15.5)
WBC: 12.8 10*3/uL — ABNORMAL HIGH (ref 4.0–10.5)
nRBC: 0 % (ref 0.0–0.2)

## 2021-10-30 LAB — RAPID URINE DRUG SCREEN, HOSP PERFORMED
Amphetamines: NOT DETECTED
Barbiturates: NOT DETECTED
Benzodiazepines: NOT DETECTED
Cocaine: NOT DETECTED
Opiates: NOT DETECTED
Tetrahydrocannabinol: NOT DETECTED

## 2021-10-30 LAB — GLUCOSE, CAPILLARY: Glucose-Capillary: 138 mg/dL — ABNORMAL HIGH (ref 70–99)

## 2021-10-30 LAB — CORTISOL: Cortisol, Plasma: 13.8 ug/dL

## 2021-10-30 LAB — SODIUM: Sodium: 116 mmol/L — CL (ref 135–145)

## 2021-10-30 LAB — MAGNESIUM: Magnesium: 1.7 mg/dL (ref 1.7–2.4)

## 2021-10-30 LAB — TSH: TSH: 0.749 u[IU]/mL (ref 0.350–4.500)

## 2021-10-30 LAB — PHOSPHORUS: Phosphorus: 5.2 mg/dL — ABNORMAL HIGH (ref 2.5–4.6)

## 2021-10-30 LAB — OSMOLALITY: Osmolality: 236 mOsm/kg — CL (ref 275–295)

## 2021-10-30 LAB — VALPROIC ACID LEVEL: Valproic Acid Lvl: 58 ug/mL (ref 50.0–100.0)

## 2021-10-30 LAB — T4, FREE: Free T4: 1.71 ng/dL — ABNORMAL HIGH (ref 0.61–1.12)

## 2021-10-30 LAB — CK: Total CK: 5207 U/L — ABNORMAL HIGH (ref 49–397)

## 2021-10-30 MED ORDER — FLUMAZENIL 0.5 MG/5ML IV SOLN
INTRAVENOUS | Status: AC
  Filled 2021-10-30: qty 5

## 2021-10-30 MED ORDER — LACTATED RINGERS IV BOLUS
500.0000 mL | Freq: Once | INTRAVENOUS | Status: AC
Start: 2021-10-30 — End: 2021-10-30
  Administered 2021-10-30: 500 mL via INTRAVENOUS

## 2021-10-30 MED ORDER — CHLORHEXIDINE GLUCONATE 0.12% ORAL RINSE (MEDLINE KIT)
15.0000 mL | Freq: Two times a day (BID) | OROMUCOSAL | Status: DC
  Administered 2021-10-30 – 2021-10-31 (×2): 15 mL via OROMUCOSAL

## 2021-10-30 MED ORDER — ORAL CARE MOUTH RINSE
15.0000 mL | OROMUCOSAL | Status: DC
  Administered 2021-10-30 – 2021-10-31 (×5): 15 mL via OROMUCOSAL

## 2021-10-30 MED ORDER — HALOPERIDOL LACTATE 5 MG/ML IJ SOLN: 2.0000 mg | Freq: Four times a day (QID) | INTRAMUSCULAR | Status: DC | PRN

## 2021-10-30 MED ORDER — SODIUM CHLORIDE 0.9 % IV SOLN: 75.0000 mL/h | INTRAVENOUS | Status: DC

## 2021-10-30 MED ORDER — HALOPERIDOL LACTATE 5 MG/ML IJ SOLN
2.0000 mg | Freq: Four times a day (QID) | INTRAMUSCULAR | Status: DC | PRN
  Administered 2021-10-30 – 2021-11-17 (×5): 2 mg via INTRAMUSCULAR
  Filled 2021-10-30 (×6): qty 1

## 2021-10-30 MED ORDER — LORAZEPAM 2 MG/ML IJ SOLN: 2.0000 mg | INTRAMUSCULAR | Status: DC | PRN

## 2021-10-30 MED ORDER — CHLORHEXIDINE GLUCONATE CLOTH 2 % EX PADS
6.0000 | MEDICATED_PAD | Freq: Every day | CUTANEOUS | Status: DC
  Administered 2021-10-31 – 2021-11-02 (×3): 6 via TOPICAL

## 2021-10-30 MED ORDER — SODIUM CHLORIDE 3 % IV SOLN
INTRAVENOUS | Status: DC
  Administered 2021-10-30: 30 mL/h via INTRAVENOUS
  Filled 2021-10-30 (×2): qty 500

## 2021-10-30 MED ORDER — LACTATED RINGERS IV BOLUS
1000.0000 mL | Freq: Once | INTRAVENOUS | Status: DC
Start: 2021-10-30 — End: 2021-10-30

## 2021-10-30 NOTE — Progress Notes (Signed)
During shift change, the sitter for this patient alerted staff that the patient was having a seizure and not responding. A CODE BLUE alert was called and rapid response was also called in.   Patient had a pulse and was breathing voluntarily when I arrived to the room. As per the sitter, the patient seizure activity lasted 6 minutes and 49 seconds. Vital signs were taken ( BP: 163/117, HR: 120, O2: 100% 3L [place]), cardiac monitoring, IV fluids, labs and imaging were ordered.  Critical lab results were received for this patient: Lactic Acid greater than 9 and Sodium 117.  The on-call physician, Webb Silversmith NP, was notified of the patient's condition and ordered the patient be moved to the ICU. Report was given to the receiving nurse and patient was transferred to 4N29.

## 2021-10-30 NOTE — TOC Progression Note (Signed)
Transition of Care [facility]) - Progression Note    Patient Details  Name: Jim Dunn MRN: [id number] Date of Birth: [birth year]  Transition of Care [facility]) CM/SW Contact  Anzlee Hinesley [place], [place] Phone Number: 10/30/2021, 3:22 PM  Clinical Narrative:     IVC documentation faxed to the [facility].  [address], LCSW Transition of Care [phone]  Expected Discharge Plan: Home/Self Care Barriers to Discharge: Other (must enter comment), Continued Medical Work up (family declining to continue to care for pt)  Expected Discharge Plan and Services Expected Discharge Plan: Home/Self Care In-house Referral: Clinical Social Work   Post Acute Care Choice:  (TBD) Living arrangements for the past 2 months: Single Family Home                                       Social Determinants of Health (SDOH) Interventions    Readmission Risk Interventions Readmission Risk Prevention Plan 01/23/2020  Transportation Screening Complete  HRI or Home Care Consult Complete  Social Work Consult for Recovery Care Planning/Counseling Complete  Palliative Care Screening Not Applicable  Medication Review Oceanographer) (No Data)

## 2021-10-30 NOTE — TOC Progression Note (Signed)
Transition of Care [facility]) - Progression Note    Patient Details  Name: Jim Dunn MRN: [id number] Date of Birth: 03-08-1966  Transition of Care [facility]) CM/SW Contact  Haila Dena [place], [place] Phone Number: 10/30/2021, 5:28 PM  Clinical Narrative:    IVC paperwork on chart, police department contacted to serve patient.  [address], LCSW Transition of Care [phone]    Expected Discharge Plan: Home/Self Care Barriers to Discharge: Other (must enter comment), Continued Medical Work up (family declining to continue to care for pt)  Expected Discharge Plan and Services Expected Discharge Plan: Home/Self Care In-house Referral: Clinical Social Work   Post Acute Care Choice:  (TBD) Living arrangements for the past 2 months: Single Family Home                                       Social Determinants of Health (SDOH) Interventions    Readmission Risk Interventions Readmission Risk Prevention Plan 01/23/2020  Transportation Screening Complete  HRI or Home Care Consult Complete  Social Work Consult for Recovery Care Planning/Counseling Complete  Palliative Care Screening Not Applicable  Medication Review Oceanographer) (No Data)

## 2021-10-30 NOTE — Progress Notes (Addendum)
° ° °  BRIEF OVERNIGHT PROGRESS REPORT  Brief HPI:56 y.o male with significant PMH of Schizophrenia, chronic hyponatremia, seizure disorder of Depakote, Systolic CHF, COPD and tobacco abuse admitted"wandering behavior and inability to care for himself" hospital course complicated by breakthrough seizures.  SUBJECTIVE:Notified by patient's RN, that code blue/rapid response was previously called for unresponsiveness following a witnesses seizure activity lasting 6 minutes. Patient was noted to be postictal and incontinent of urine. Patient remained lethargic now with critical sodium of 117. Patient evaluated at the bedside by admitting hospitalist.  OBJECTIVE:On review of the temperature was 98.7 C, the heart rate  118 beats/minute, the blood pressure 169/108 mm Hg, the respiratory rate 25 breaths/minute, and the oxygen saturation 100% on. 2L He was lethargic, moaned intermittently, and responded with one-word answers; symmetric movement in the arms and legs was observed per RN.  LAB/DIAGNOSTICS: Na+/ K+: 117/4.4 Glucose: 114 BUN/Cr.:8/0.89 CO2: 12 Calcium: 9.2 AST/ALT:84/18 Anion Gap: 23   WBC/ TMAX: /12.8/ afebrile PCT: pending  Lactic acid: >9.0 Venous Blood Gas result:  pO2 97; pCO2 30; pH 7.32;  HCO3 15.5, %O2 Sat 98.   ASSESSMENT &PLAN  Breakthrough Seizures in the setting of severe Hyponatremia Hx of Seizure Disorder on Depakote, Levels therapeutic -Transfer to ICU -Correct sodium levels -Continue Depakote -Ativan PRN for breakthrough seizures -Seizure precautions -MRI Brain done on 1/23 did not show any pathology -EEG and CT Head pending -Consider Neurology consult -PCCM consulted due to high risk for recurrent seizures  and decompensation in the setting of severe hyponatremia.  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 -Check cortisol, TSH, serum osmolality, sodium osmolality -Follow Serial Sodium Q2h -Keep NPO -Start Hypertonic Saline 3% with goal serum sodium level not > 8 to per L in the first 24 hour -Frequent Neurochecks as above -Consider Nephrology consult to assist with management  Anion Gap Metabolic Acidosis with Lactic Acidosis Likely in the setting of breakthrough seizure -Trend lactate -Bicarb gtt -Monitor ABG/VBG   Leukocytosis likely reactive in the setting of above -Obtain chest x-ray and UA  -F/u cultures, trend lactic/ PCT -Monitor WBC/ fever curve -Hold antibiotics for now pending cultures -Strict I/O's    Webb Silversmith, DNP, CCRN, FNP-C, AGACNP-BC Acute Care Nurse Practitioner  [facility] Pager: [phone] [place] [facility]

## 2021-10-30 NOTE — Progress Notes (Signed)
Critical sodium of 116, [place], NP made aware, Ccm at the room and aware too, stated to Up rate to 25m/hr

## 2021-10-30 NOTE — Progress Notes (Signed)
Virgilio Belling (Sister) was made aware of pt's transfer to [phone]

## 2021-10-30 NOTE — Progress Notes (Addendum)
HOSPITAL MEDICINE OVERNIGHT EVENT NOTE    Contacted by the IMTS code team team after they received a CODE BLUE alert for this patient.  Upon the IM team arrived to the patient's bedside the patient had a pulse, was spontaneously breathing and was arousable to voice.  I promptly went to go evaluate the patient at the bedside.  Upon discussions with the staff, it turns out that the bedside sitter observed that the patient was having generalized seizure activity lasting 6 minutes and 49 seconds.    On exam, patient is extremely lethargic but is arousable to verbal stimuli.  Patient denies headache or pain.  Patient does seem somewhat disoriented.  It is worth noting that patient seems to have experienced an episode of bladder incontinence.  Patient is intermittently following commands however I have been able to get him to move all 4 extremities spontaneously.  Sensation is seemingly grossly intact.  Extraocular movements appear to be intact.  Episode of seizure supported by urinary incontinence and postictal state.  I have been informed by nursing staff that the patient regularly elopes from the medical unit to go smoke.  A urine toxicology screen was performed earlier today which was completely normal.  I will additionally obtain lactic acid level, creatine kinase, electrolytes, CBC.  Patient is already on valproic acid and therefore I have additionally ordered a valproic acid level.  Finally, because I have observed that patient has become increasingly tachycardic since this morning we will additionally obtain a urinalysis and chest x-ray.  Continue to monitor with seizure precautions on telemetry.  Will await completion of this work-up and based on these results will potentially discuss with neurology.  Marinda Elk  MD [facility]   ADDENDUM (2/26 9:30pm)  Identified the patient's sodium is 117, down from 133 yesterday.  Suspect, in this patient with schizophrenia that he has been  eloping the unit to consume large amounts of water due to psychogenic polydipsia causing the severe acute hyponatremia and hence the seizure.  I have discussed the case with Webb Silversmith NP, another member of my care team who will coordinate with PCCM to get the patient transferred over to the intensive care unit for immediate institution of hypertonic saline.  Deno Lunger Breyanna Valera

## 2021-10-30 NOTE — Progress Notes (Addendum)
Patient is agitated and restless. He is speaking loudly to himself and acting impulsive. Patient has orders to leave the unit for 30 minutes or less but he wondered onto another unit (4N). This nurse was called and the patient was returned to his room on 5N. Patient is currently laying in bed with the room door open. Bed is at the lowest position. Will continue to monitor patient. NP Orpha Bur Foust) made aware of patient's behavior.   1027: Patient was given a dose of Xanax. Patient is currently pacing around his room.  0645: Patient continues to wander off the unit. Hospital security was able to find patient on 6N and bring patient back to his room on 5N. Patient continues to be agitated and restless. NP Orpha Bur Foust) made aware. One to one sitter order placed.

## 2021-10-30 NOTE — Progress Notes (Signed)
Progress Note   Patient: Jim Dunn [id number] DOB: [birth year] DOA: 09/24/2021     31 DOS: the patient was seen and examined on 10/30/2021   Brief hospital course: 56 year old male with history of schizophrenia, chronic hyponatremia, seizure disorder, tobacco use, systolic congestive heart failure, COPD who was brought here on 09/24/2021 to ED by his family after they were unable to take care of him.  He was drinking a lot of water and trying to burn things.  Previously he was staying at group home and was kicked out.  Neurology and psychiatry were consulted after admission.  Hospital course remarkable for acute on chronic hyponatremia.  Prolonged hospitalization.  TOC following. APS in process to determine if they will pursue guardianship  Subjective:  Patient alert and awake, lying down in bed at the time of my evaluation.  Per nursing staff, patient has been wandering from the hospital and at times going to other units in other patients rooms as well.  This creates a significant safety concern. When I mentioned this to the patient, he says he understands and agrees to not repeated.  But soon after that, he is walking off the unit again. Because of repeated behavior issue, I ordered him for IM Haldol as needed and also started IVC.  Assessment and Plan: Wandering behavior Underlying dementia with behavioral disturbance Paranoid schizophrenia and hallucination -While in the hospital, patient has been wandering to different floors and even banging on other patient's door. -He has had neuropsychiatric evaluation.  He was diagnosed with dementia probably because of years of schizophrenia and nonadherence to medications. -Currently waiting for guardianship. -Because of his abnormal behavior, I started him on IM Haldol as needed and IVC today.  Chronic hyponatremia- (present on admission) Presented with symptomatic hyponatremia in context of polydipsia prior to admission-this is a recurrent  problem Continue 1500 cc per 24-hour fluid restriction Continue sodium tablets 2 gm PO bid.  12/20 sodium 129  Constipation -Bowel regimen  Peripheral neuropathy Chronic issue and was present during previous admission Likely related to years of substance abuse Continue low-dose Lyrica since was effective previous  Seizure disorder  Continue Depakote-valproic acid level 41 previous level on 2/10 was 0.2 History of nonadherence and valproic acid on 1/22 was 19-repeat level in a.m. EEG negative for acute seizure activity Continue seizure precaution Depakote can also function as a mood stabilizer MRI revealed arachnoid cyst that is chronic in nature  COPD (chronic obstructive pulmonary disease)  Compensated Given Pneumovax immunization this admission  Mixed hyperlipidemia Continue lipitor Apparently has been nonadherent with medicines prior to admission  Chronic systolic CHF (congestive heart failure) Appears euvolemic Echocardiogram performed on 09/26/2021 EF of 30-35% with moderately decreased systolic function and global hypokinesis. RV size and systolic function is normal. Continue entresto, digoxin and lasix.  Blood pressure running elevated.  Heart rate in 90s.   -2/25, I started him on Coreg 3.125 mg twice daily and hydralazine IV as needed. Digoxin level subtherapeutic as of 2/10 but this also appears to be a chronic issue.  Follow periodically Strict I/O and daily weights Fluid restriction for chronic hyponatremia Weight does not appear to be accurate noting on 2/14 he weighed 193 pounds and on 3/15 he weighed 237   Parameters for off unit privileges are as follows: 1) staff must be notified when he leaves the unit 2) he is not allowed to be off the unit for more than 30-minute 3) he is not allowed to leave hospital property  Physical Exam:  Vitals:   10/29/21 1701 10/29/21 1900 10/30/21 0350 10/30/21 1119  BP: (!) 180/100 (!) 146/86  (!) 187/132  Pulse:  90  (!)  105  Resp:  16  18  Temp:  98.4 F (36.9 C)  98.8 F (37.1 C)  TempSrc:  Oral  Oral  SpO2:  100%  99%  Weight:   95.8 kg   Height:       Gen: No acute distress, calm  Pulm: Anterior lung sounds clear to auscultation, on room air, no increased work of breathing Card: S1-S2, normotensive, no peripheral edema Abd: Soft nontender nondistended with normal active bowel sounds.  Eating well.  LBM 2/21 Neuro: Cranial nerves II through XII grossly intact, moves all extremities x4 without any focal neurological deficits.  Sensation intact.  Mobilizes independently Psych: Alert and oriented x3 but still lacks insight into mental disabilities that prevent patient from managing care safely and independently in the home environment   Data Reviewed:  Results reviewed  Family Communication:  Patient  Disposition: Remains inpatient appropriate because:  Unsafe DC plan  Planned Discharge Destination:  Barriers to discharge: APS to determine if they will pursue guardianship. Family refusing to take pt because stating they cannot manage him noting refuses to take meds  Medically stable Yes  COVID vaccination status:  [place] 07/20/2020  Consultants: Psychiatry Neurology Procedures: EEG Echocardiogram Antibiotics: None    Time spent: 15 minutes  Author: Terrilee Croak, MD 10/30/2021 1:19 PM  For on call review [email or web address].

## 2021-10-30 NOTE — Consult Note (Addendum)
NAME:  Jim Dunn, MRN:  [id number], DOB:  [birth year], LOS: 31 ADMISSION DATE:  09/24/2021, CONSULTATION DATE:  2/26 REFERRING MD:  Dr. Anna Genre, CHIEF COMPLAINT:  hyponatremia; seizure   History of Present Illness:  Patient is a 56 year old male with pertinent PMH of schizophrenia, chronic hyponatremia, seizure disorder, systolic CHF, COPD, tobacco abuse presents to [facility] ED on 1/21 due to AMS.   Family stated that patient was drinking a lot of water prior to admission and were worried about low NA. Patient baseline NA is 120-126. Patient presented to ED with hallucinations. Family also concerned that they can't take care of him any more because he needs constant supervision. On admission, Na 129, ethanol negative, osmolality 262.  Patient admitted to [facility] on the floors.  Patient with prolonged hospitalization.  TOC following.  APS to determine if they will pursue guardianship.  On 2/26 around 6:45 PM patient was on the floors and a CODE BLUE was called.  Patient never lost a pulse, but was having active seizure that lasted about 6 minutes.  Patient was initially altered postseizure, but regained mental status soon after.  Sodium was 117 from 134 which likely the cause of the seizure.  Transfer to ICU to be started on hypertonic saline.  PCCM consulted  Pertinent  Medical History   Past Medical History:  Diagnosis Date   CHF (congestive heart failure) (HCC)    COPD (chronic obstructive pulmonary disease) (HCC)    History of kidney stones    Hyponatremia    Paranoid schizophrenia (HCC)    Seizures (HCC)    Tobacco abuse      Significant Hospital Events: Including procedures, antibiotic start and stop dates in addition to other pertinent events   1/21: admitted to [facility] 2/26: patient had seizure for 6 minutes on floors; Na dropped to 117; started on hypertonic saline and brought to ICU  Interim History / Subjective:  Patient lying in bed with sitter in room Patient does wake up following  commands; AO x3; MAE Vital stable  Objective   Blood pressure (!) 164/100, pulse 100, temperature 98.5 F (36.9 C), temperature source Oral, resp. rate 16, height 5\' 8"  (1.727 m), weight 103 kg, SpO2 96 %.       No intake or output data in the 24 hours ending 10/30/21 2248 Filed Weights   10/29/21 0347 10/30/21 0350 10/30/21 2223  Weight: 95.1 kg 95.8 kg 103 kg    Examination: General:   NAD; patient asleep but arousable HEENT: MM pink/moist Neuro: Aox3; MAE; follows commands; PERRL CV: s1s2, no m/r/g PULM:  dim clear BS bilaterally GI: soft, bsx4 active  Extremities: warm/dry, no edema appreciated Skin: no rashes or lesions appreciated   Resolved Hospital Problem list     Assessment & Plan:   Seizure: 2/26 6-minute seizure on floors; EEG on admission was negative for acute seizure; NA dropped to 117 from 133 Acute on chronic hyponatremia: Recurrent problem with polydipsia Acute encephalopathy: Improving Elevated CK P: -Transferred to ICU for closer monitoring -NA 117; started on hypertonic saline -Increase NA slowly; avoid increasing NA > 10 mEq in 24 hours -Q2 NA checks -Fluid restriction - CT head pending -neuro consulted by hospitalist; consider EEG per neuro -Frequent neurochecks; avoid sedating meds -Seizure precautions in place -As needed Ativan for seizure -Follow up cortisol, TSH, serum osmolality, serum sodium, urine na, Urine osmolality -Trend CK -CBG monitoring -Continue Depakote  Wandering behavior Underlying dementia with behavioral disturbance Paranoid schizophrenia and hallucination -Patient has  been wandering to different floors and needs a sitter P: -Haldol as needed -Continue Seroquel, Remeron, paliperidone  Chronic systolic CHF: Echo 09/26/2021 EF 30 to 35% with moderate decreased systolic function P: -Continue Entresto, digoxin, Coreg, and Lasix -As needed hydralazine -Strict I/O's; Daily weights -Fluid  restriction  Leukocytosis P: -Likely reactive post seizure -Trend CBC  COPD P: -As needed DuoNeb for wheezing  Constipation P: -Continue BR  Peripheral neuropathy P: -Lyrica   Best Practice (right click and "Reselect all SmartList Selections" daily)   Diet/type: NPO w/ oral meds DVT prophylaxis: LMWH GI prophylaxis: N/A Lines: N/A Foley:  N/A Code Status:  full code Last date of multidisciplinary goals of care discussion [pending]  Labs   CBC: Recent Labs  Lab 10/28/21 1513 10/30/21 2008  WBC 6.8 12.8*  NEUTROABS 2.8 5.8  HGB 12.7* 14.7  HCT 37.8* 43.4  MCV 69.4* 67.8*  PLT 171 204    Basic Metabolic Panel: Recent Labs  Lab 10/24/21 0727 10/29/21 0144 10/30/21 2008  NA 129* 133* 117*  K 4.3 4.0 4.4  CL 95* 100 82*  CO2 27 24 12*  GLUCOSE 82 90 114*  BUN 13 9 8   CREATININE 0.63 0.63 0.89  CALCIUM 9.0 9.2 9.2  MG  --   --  1.7  PHOS  --   --  5.2*   GFR: Estimated Creatinine Clearance: 111.6 mL/min (by C-G formula based on SCr of 0.89 mg/dL). Recent Labs  Lab 10/28/21 1513 10/30/21 2003 10/30/21 2008  WBC 6.8  --  12.8*  LATICACIDVEN  --  >9.0*  --     Liver Function Tests: Recent Labs  Lab 10/29/21 0144 10/30/21 2008  AST 17 84*  ALT 11 18  ALKPHOS 66 80  BILITOT 0.6 1.4*  PROT 6.7 7.5  ALBUMIN 3.9 4.2   No results for input(s): LIPASE, AMYLASE in the last 168 hours. No results for input(s): AMMONIA in the last 168 hours.  ABG    Component Value Date/Time   HCO3 15.5 (L) 10/30/2021 2003   ACIDBASEDEF 9.3 (H) 10/30/2021 2003   O2SAT 98 10/30/2021 2003     Coagulation Profile: No results for input(s): INR, PROTIME in the last 168 hours.  Cardiac Enzymes: Recent Labs  Lab 10/30/21 2008  CKTOTAL 5,207*    HbA1C: No results found for: HGBA1C  CBG: Recent Labs  Lab 10/30/21 2009  GLUCAP 138*    Review of Systems:   Patient is encephalopathic and/or intubated. Therefore history has been obtained from chart  review.    Past Medical History:  He,  has a past medical history of CHF (congestive heart failure) (HCC), COPD (chronic obstructive pulmonary disease) (HCC), History of kidney stones, Hyponatremia, Paranoid schizophrenia (HCC), Seizures (HCC), and Tobacco abuse.   Surgical History:   Past Surgical History:  Procedure Laterality Date   KIDNEY STONE SURGERY     MULTIPLE EXTRACTIONS WITH ALVEOLOPLASTY Bilateral 04/29/2020   Procedure: Extraction of tooth #'s 2-13, 17,18, 21-25, and 27-31 with alveoloplasty and bilateral lingual exostoses reductions.;  Surgeon: 08-15-1973, DDS;  Location: [facility] OR;  Service: Oral Surgery;  Laterality: Bilateral;     Social History:   reports that he has been smoking cigarettes. He has been smoking an average of 1 pack per day. His smokeless tobacco use includes chew. He reports that he does not currently use alcohol. He reports that he does not use drugs.   Family History:  His family history includes Heart disease in an other  family member.   Allergies No Known Allergies   Home Medications  Prior to Admission medications   Medication Sig Start Date End Date Taking? Authorizing Provider  busPIRone (BUSPAR) 10 MG tablet Take 1 tablet (10 mg total) by mouth 3 (three) times daily. 08/03/21  Yes Blane Ohara, MD  chlorproMAZINE (THORAZINE) 100 MG tablet Take 1 tablet (100 mg total) by mouth 3 (three) times daily. 08/03/21  Yes Blane Ohara, MD  digoxin (LANOXIN) 0.25 MG tablet Take 1 tablet (0.25 mg total) by mouth daily. 08/03/21  Yes Blane Ohara, MD  divalproex (DEPAKOTE ER) 500 MG 24 hr tablet Take 2 tablets (1,000 mg total) by mouth at bedtime. 08/03/21  Yes Blane Ohara, MD  sacubitril-valsartan (ENTRESTO) 24-26 MG Take 1 tablet by mouth 2 (two) times daily. 08/03/21  Yes Blane Ohara, MD  acetaminophen (TYLENOL) 325 MG tablet Take 2 tablets (650 mg total) by mouth every 4 (four) hours as needed for mild pain or fever. Patient not  taking: Reported on 09/11/2021 06/07/20   Russella Dar, NP  atorvastatin (LIPITOR) 40 MG tablet Take 1 tablet (40 mg total) by mouth daily. Patient not taking: Reported on 09/25/2021 08/03/21   Blane Ohara, MD  diazepam (VALIUM) 2 MG tablet Take 1 tablet (2 mg total) by mouth 2 (two) times daily. Patient not taking: Reported on 09/11/2021 08/03/21   Blane Ohara, MD  furosemide (LASIX) 20 MG tablet Take 1 tablet (20 mg total) by mouth daily. Patient not taking: Reported on 09/11/2021 08/03/21   Blane Ohara, MD  [facility] SUSTENNA 234 MG/1.5ML SUSY injection Inject 234 mg into the muscle every 30 (thirty) days. Patient not taking: Reported on 09/11/2021 06/07/20   Russella Dar, NP  nicotine (NICODERM CQ) 14 mg/24hr patch Place 1 patch (14 mg total) onto the skin daily. Patient not taking: Reported on 09/25/2021 08/03/21   Blane Ohara, MD  paliperidone (INVEGA) 6 MG 24 hr tablet Take 1 tablet (6 mg total) by mouth at bedtime. Patient not taking: Reported on 09/25/2021 08/03/21   Blane Ohara, MD  phenytoin (DILANTIN) 300 MG ER capsule Take 1 capsule (300 mg total) by mouth 2 (two) times daily. Patient not taking: Reported on 09/11/2021 08/03/21   Blane Ohara, MD  QUEtiapine (SEROQUEL) 200 MG tablet Take 1 tablet (200 mg total) by mouth daily. Patient not taking: Reported on 09/11/2021 08/03/21   Blane Ohara, MD     Critical care time: 45 minutes     JD Anselm Lis  Pulmonary & Critical Care 10/30/2021, 10:48 PM  Please see [email or web address] for pager details.  From 7A-7P if no response, please call [phone]. After hours, please call ELink [phone].

## 2021-10-31 ENCOUNTER — Inpatient Hospital Stay (HOSPITAL_COMMUNITY): Payer: Medicare PPO

## 2021-10-31 DIAGNOSIS — E871 Hypo-osmolality and hyponatremia: Secondary | ICD-10-CM | POA: Diagnosis not present

## 2021-10-31 DIAGNOSIS — F209 Schizophrenia, unspecified: Secondary | ICD-10-CM

## 2021-10-31 LAB — CBC WITH DIFFERENTIAL/PLATELET
Abs Immature Granulocytes: 0.05 10*3/uL (ref 0.00–0.07)
Basophils Absolute: 0 10*3/uL (ref 0.0–0.1)
Basophils Relative: 1 %
Eosinophils Absolute: 0.1 10*3/uL (ref 0.0–0.5)
Eosinophils Relative: 1 %
HCT: 38.2 % — ABNORMAL LOW (ref 39.0–52.0)
Hemoglobin: 12.8 g/dL — ABNORMAL LOW (ref 13.0–17.0)
Immature Granulocytes: 1 %
Lymphocytes Relative: 36 %
Lymphs Abs: 3.1 10*3/uL (ref 0.7–4.0)
MCH: 22.4 pg — ABNORMAL LOW (ref 26.0–34.0)
MCHC: 33.5 g/dL (ref 30.0–36.0)
MCV: 66.8 fL — ABNORMAL LOW (ref 80.0–100.0)
Monocytes Absolute: 1.2 10*3/uL — ABNORMAL HIGH (ref 0.1–1.0)
Monocytes Relative: 14 %
Neutro Abs: 4.1 10*3/uL (ref 1.7–7.7)
Neutrophils Relative %: 47 %
Platelets: 100 10*3/uL — ABNORMAL LOW (ref 150–400)
RBC: 5.72 MIL/uL (ref 4.22–5.81)
RDW: 15 % (ref 11.5–15.5)
WBC: 8.6 10*3/uL (ref 4.0–10.5)
nRBC: 0 % (ref 0.0–0.2)

## 2021-10-31 LAB — SODIUM
Sodium: 118 mmol/L — CL (ref 135–145)
Sodium: 125 mmol/L — ABNORMAL LOW (ref 135–145)
Sodium: 127 mmol/L — ABNORMAL LOW (ref 135–145)
Sodium: 130 mmol/L — ABNORMAL LOW (ref 135–145)

## 2021-10-31 LAB — CBC
HCT: 38.8 % — ABNORMAL LOW (ref 39.0–52.0)
Hemoglobin: 13.3 g/dL (ref 13.0–17.0)
MCH: 23 pg — ABNORMAL LOW (ref 26.0–34.0)
MCHC: 34.3 g/dL (ref 30.0–36.0)
MCV: 67.1 fL — ABNORMAL LOW (ref 80.0–100.0)
Platelets: 172 10*3/uL (ref 150–400)
RBC: 5.78 MIL/uL (ref 4.22–5.81)
RDW: 15 % (ref 11.5–15.5)
WBC: 8.7 10*3/uL (ref 4.0–10.5)
nRBC: 0 % (ref 0.0–0.2)

## 2021-10-31 LAB — URINALYSIS, ROUTINE W REFLEX MICROSCOPIC
Bilirubin Urine: NEGATIVE
Glucose, UA: NEGATIVE mg/dL
Hgb urine dipstick: NEGATIVE
Ketones, ur: 5 mg/dL — AB
Leukocytes,Ua: NEGATIVE
Nitrite: NEGATIVE
Protein, ur: NEGATIVE mg/dL
Specific Gravity, Urine: 1.01 (ref 1.005–1.030)
pH: 6 (ref 5.0–8.0)

## 2021-10-31 LAB — RESP PANEL BY RT-PCR (FLU A&B, COVID) ARPGX2
Influenza A by PCR: NEGATIVE
Influenza B by PCR: NEGATIVE
SARS Coronavirus 2 by RT PCR: NEGATIVE

## 2021-10-31 LAB — GLUCOSE, CAPILLARY
Glucose-Capillary: 108 mg/dL — ABNORMAL HIGH (ref 70–99)
Glucose-Capillary: 95 mg/dL (ref 70–99)
Glucose-Capillary: 83 mg/dL (ref 70–99)
Glucose-Capillary: 141 mg/dL — ABNORMAL HIGH (ref 70–99)

## 2021-10-31 LAB — BASIC METABOLIC PANEL
Anion gap: 10 (ref 5–15)
BUN: 11 mg/dL (ref 6–20)
CO2: 24 mmol/L (ref 22–32)
Calcium: 8.5 mg/dL — ABNORMAL LOW (ref 8.9–10.3)
Chloride: 85 mmol/L — ABNORMAL LOW (ref 98–111)
Creatinine, Ser: 1.32 mg/dL — ABNORMAL HIGH (ref 0.61–1.24)
GFR, Estimated: >60 mL/min (ref >60)
Glucose, Bld: 89 mg/dL (ref 70–99)
Potassium: 3.9 mmol/L (ref 3.5–5.1)
Sodium: 119 mmol/L — CL (ref 135–145)

## 2021-10-31 LAB — COMPREHENSIVE METABOLIC PANEL
ALT: 16 U/L (ref 0–44)
AST: 65 U/L — ABNORMAL HIGH (ref 15–41)
Albumin: 3.8 g/dL (ref 3.5–5.0)
Alkaline Phosphatase: 59 U/L (ref 38–126)
Anion gap: 11 (ref 5–15)
BUN: 10 mg/dL (ref 6–20)
CO2: 21 mmol/L — ABNORMAL LOW (ref 22–32)
Calcium: 8.6 mg/dL — ABNORMAL LOW (ref 8.9–10.3)
Chloride: 85 mmol/L — ABNORMAL LOW (ref 98–111)
Creatinine, Ser: 1.23 mg/dL (ref 0.61–1.24)
GFR, Estimated: >60 mL/min (ref >60)
Glucose, Bld: 102 mg/dL — ABNORMAL HIGH (ref 70–99)
Potassium: 3.6 mmol/L (ref 3.5–5.1)
Sodium: 117 mmol/L — CL (ref 135–145)
Total Bilirubin: 1.1 mg/dL (ref 0.3–1.2)
Total Protein: 6.5 g/dL (ref 6.5–8.1)

## 2021-10-31 LAB — SODIUM, URINE, RANDOM: Sodium, Ur: <10 mmol/L

## 2021-10-31 LAB — CK
Total CK: 4015 U/L — ABNORMAL HIGH (ref 49–397)
Total CK: 3523 U/L — ABNORMAL HIGH (ref 49–397)
Total CK: 2660 U/L — ABNORMAL HIGH (ref 49–397)

## 2021-10-31 LAB — LACTIC ACID, PLASMA
Lactic Acid, Venous: 1 mmol/L (ref 0.5–1.9)
Lactic Acid, Venous: 1.7 mmol/L (ref 0.5–1.9)

## 2021-10-31 LAB — MAGNESIUM: Magnesium: 1.8 mg/dL (ref 1.7–2.4)

## 2021-10-31 LAB — OSMOLALITY, URINE: Osmolality, Ur: 291 mOsm/kg — ABNORMAL LOW (ref 300–900)

## 2021-10-31 MED ORDER — SODIUM CHLORIDE 0.9 % IV BOLUS
1000.0000 mL | Freq: Once | INTRAVENOUS | Status: AC
  Administered 2021-10-31: 1000 mL via INTRAVENOUS

## 2021-10-31 MED ORDER — SODIUM CHLORIDE 0.9 % IV SOLN: INTRAVENOUS | Status: DC

## 2021-10-31 MED ORDER — ORAL CARE MOUTH RINSE
15.0000 mL | Freq: Two times a day (BID) | OROMUCOSAL | Status: DC
  Administered 2021-10-31 – 2021-11-24 (×39): 15 mL via OROMUCOSAL

## 2021-10-31 MED ORDER — SACUBITRIL-VALSARTAN 24-26 MG PO TABS
1.0000 | ORAL_TABLET | Freq: Two times a day (BID) | ORAL | Status: DC
  Administered 2021-11-01 – 2022-03-09 (×257): 1 via ORAL
  Filled 2021-10-31 (×260): qty 1

## 2021-10-31 MED ORDER — IPRATROPIUM-ALBUTEROL 0.5-2.5 (3) MG/3ML IN SOLN
3.0000 mL | RESPIRATORY_TRACT | Status: DC | PRN
Start: 2021-10-31 — End: 2022-01-08

## 2021-10-31 NOTE — Progress Notes (Addendum)
NAME:  Jim Dunn, MRN:  [id number], DOB:  1965-10-22, LOS: 32 ADMISSION DATE:  09/24/2021, CONSULTATION DATE:  2/27 REFERRING MD:  Pola Corn, CHIEF COMPLAINT:  Seizure   History of Present Illness:  56 y/o male with a complex past medical history was admitted for abnormal behavior and a low sodium, family said they can no longer take care of him.  Of note he has chronic hyponatremia as well as paranoid schizophrenia.  He had been treated since 1/21, then on 2/26 PCCM was called for consultation after he had a hyponatremia induced seizure.  The patients sodium was at baseline (133) one day prior to admission, then dropped to 117.  Of note the patient had been seen wandering off the floor on 2/26.  He has a history of psychogenic polydipsia.   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 rate 12, height 5\' 8"  (1.727 m), weight 102.6 kg, SpO2 97 %.        Intake/Output Summary (Last 24 hours) at 10/31/2021 1002 Last data filed at 10/31/2021 0900 Gross per 24 hour  Intake 1218.48 ml  Output 500 ml  Net 718.48 ml   Filed Weights   10/30/21 0350 10/30/21 2223 10/31/21 0357  Weight: 95.8 kg 103 kg 102.6 kg    Examination:  General:  Resting comfortably in bed HENT: NCAT OP clear PULM: CTA B,  normal effort CV: RRR, no mgr GI: BS+, soft, nontender MSK: normal bulk and tone Neuro: non-verbal with me but he follows commands (give thumbs up) and moves all four extremities on command Psyche: non-verbal for me, won't open eyes on my visit but will nod head occasionally to questions   Resolved Hospital Problem list     Assessment & Plan:  Acute seizure in setting of hyponatremia > has not recurred since receiving hypernatremia Depakote level is therapeutic Seizure precautions Continue depakote and lyrica Ativan prn  Critical Hyponatremia> acute on chronic; yesterday's episode was due to psychogenic polydipsia, drinking a bunch of water when he wandered off the unit Free water restrict Sitter Continue hypertonic saline until Na > 120 Na q4h  Wandering behavior 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 Invega Remeron seroquel  Chronic systolic CHF Hold entresto tonight, continue digoxin, coreg Hold lasix now Monitor I/O  COPD Prn duoneb  Constipation Bowel regimen as ordered  Peripheral neuropathy Lyrica  AKI, new problem, possibly rhabdo from seizure Continue hypertonic saline Hold lasix Will give gentle IV saline on 2/27 once we stop the hypertonic saline  Best Practice (right click and "Reselect all SmartList Selections" daily)   Diet/type: Regular consistency (see orders) DVT prophylaxis: LMWH GI prophylaxis: N/A Lines: N/A Foley:  N/A Code Status:  full code Last date of multidisciplinary goals of care discussion 3/27 ask social worker who can take guardianship]  Labs  CBC: Recent Labs  Lab 10/28/21 1513 10/30/21 2008 10/31/21 0223  WBC 6.8 12.8* 8.7  NEUTROABS 2.8 5.8  --   HGB 12.7* 14.7 13.3  HCT 37.8* 43.4 38.8*  MCV 69.4* 67.8* 67.1*  PLT 171 204 172    Basic Metabolic Panel: Recent Labs  Lab 10/29/21 0144 10/30/21 2008 10/30/21 2225 10/31/21 0050 10/31/21 0223  10/31/21 0726  NA 133* 117* 116* 118* 117* 119*  K 4.0 4.4  --   --  3.6 3.9  CL 100 82*  --   --  85* 85*  CO2 24 12*  --   --  21* 24  GLUCOSE 90 114*  --   --  102* 89  BUN 9 8  --   --  10 11  CREATININE 0.63 0.89  --   --  1.23 1.32*  CALCIUM 9.2 9.2  --   --  8.6* 8.5*  MG  --  1.7  --   --  1.8  --   PHOS  --  5.2*  --   --   --   --    GFR: Estimated Creatinine Clearance: 75.2 mL/min (A) (by C-G formula based on SCr of 1.32 mg/dL (H)). Recent Labs  Lab 10/28/21 1513 10/30/21 2003 10/30/21 2008 10/30/21 2225 10/31/21 0223 10/31/21 0726  WBC 6.8  --  12.8*  --  8.7  --   LATICACIDVEN  --  >9.0*  --  1.5  --  1.0    Liver Function Tests: Recent Labs  Lab 10/29/21 0144 10/30/21 2008 10/31/21 0223  AST 17 84* 65*  ALT 11 18 16   ALKPHOS 66 80 59  BILITOT 0.6 1.4* 1.1  PROT 6.7 7.5 6.5  ALBUMIN 3.9 4.2 3.8   No results for input(s): LIPASE, AMYLASE in the last 168 hours. No results for input(s): AMMONIA in the last 168 hours.  ABG    Component Value Date/Time   HCO3 15.5 (L) 10/30/2021 2003   ACIDBASEDEF 9.3 (H) 10/30/2021 2003   O2SAT 98 10/30/2021 2003     Coagulation Profile: No results for input(s): INR, PROTIME in the last 168 hours.  Cardiac Enzymes: Recent Labs  Lab 10/30/21 2008 10/31/21 0223 10/31/21 0732  CKTOTAL 5,207* 4,015* 3,523*    HbA1C: No results found for: HGBA1C  CBG: Recent Labs  Lab 10/30/21 2009 10/31/21 0404 10/31/21 0743  GLUCAP 138* 108* 95       Critical care time: 35 minutes     11/02/21, MD Romeo PCCM Pager: [phone] Cell: [phone] After 7:00 pm call Elink  [phone]

## 2021-10-31 NOTE — Progress Notes (Signed)
Date and time results received: 10/31/21 0830   Test: Na+ Critical Value: 119  Name of Provider Notified: Dr Pietro Cassis   Orders Received? Or Actions Taken?: No new orders

## 2021-10-31 NOTE — Progress Notes (Signed)
Events of yesterday evening noted regarding abrupt drop in sodium with subsequent grand mal seizure activity.  Patient medically unstable with persistent hypotension.  Unclear if solely related to volume depletion or other issues such as sepsis physiology.  Infectious work-up underway.  Discussed with attending physician who will reassume total care of this patient until he becomes medically stable enough to return to the [facility] service

## 2021-10-31 NOTE — Progress Notes (Signed)
CSW received notification that patient is currently under IVC due to concerns that transpired over the weekend regarding patient wandering off the unit and going throughout the hospital. Patient had orders that stated he had off unit privileges that have now been discontinued.  CSW to continue following for discharge planning.  [name], MSW, LCSW Transitions of Care   Clinical Social Worker II [phone]

## 2021-11-01 DIAGNOSIS — E871 Hypo-osmolality and hyponatremia: Secondary | ICD-10-CM | POA: Diagnosis not present

## 2021-11-01 DIAGNOSIS — R748 Abnormal levels of other serum enzymes: Secondary | ICD-10-CM

## 2021-11-01 LAB — BASIC METABOLIC PANEL
Anion gap: 8 (ref 5–15)
BUN: 13 mg/dL (ref 6–20)
CO2: 24 mmol/L (ref 22–32)
Calcium: 8.4 mg/dL — ABNORMAL LOW (ref 8.9–10.3)
Chloride: 99 mmol/L (ref 98–111)
Creatinine, Ser: 0.61 mg/dL (ref 0.61–1.24)
GFR, Estimated: >60 mL/min (ref >60)
Glucose, Bld: 90 mg/dL (ref 70–99)
Potassium: 4.1 mmol/L (ref 3.5–5.1)
Sodium: 131 mmol/L — ABNORMAL LOW (ref 135–145)

## 2021-11-01 LAB — GLUCOSE, CAPILLARY
Glucose-Capillary: 116 mg/dL — ABNORMAL HIGH (ref 70–99)
Glucose-Capillary: 139 mg/dL — ABNORMAL HIGH (ref 70–99)
Glucose-Capillary: 84 mg/dL (ref 70–99)
Glucose-Capillary: 95 mg/dL (ref 70–99)
Glucose-Capillary: 99 mg/dL (ref 70–99)

## 2021-11-01 LAB — URINE CULTURE

## 2021-11-01 LAB — LACTIC ACID, PLASMA: Lactic Acid, Venous: 1.6 mmol/L (ref 0.5–1.9)

## 2021-11-01 LAB — CK
Total CK: 2630 U/L — ABNORMAL HIGH (ref 49–397)
Total CK: 2104 U/L — ABNORMAL HIGH (ref 49–397)
Total CK: 2298 U/L — ABNORMAL HIGH (ref 49–397)

## 2021-11-01 LAB — SODIUM: Sodium: 134 mmol/L — ABNORMAL LOW (ref 135–145)

## 2021-11-01 MED ORDER — POLYETHYLENE GLYCOL 3350 17 G PO PACK
17.0000 g | PACK | Freq: Every day | ORAL | Status: DC | PRN
  Administered 2021-12-18 – 2021-12-21 (×3): 17 g via ORAL
  Filled 2021-11-01 (×3): qty 1

## 2021-11-01 NOTE — Assessment & Plan Note (Addendum)
Secondary to recent seizure activity as well as volume depletion Encourage oral intake of electrolyte based fluids CK trend is downward.  2298 > 1926 > 1616 > 970 and now is less than 400

## 2021-11-01 NOTE — Progress Notes (Signed)
Progress Note   Patient: Jim Dunn [id number]:[id number] DOB: Jun 28, 1966 DOA: 09/24/2021     33 DOS: the patient was seen and examined on 11/01/2021   Brief hospital course: Jim Dunn is a 56 y.o. male with PMH significant for schizophrenia, chronic hyponatremia, seizure disorder, tobacco use, systolic congestive heart failure, COPD who was brought to ED on 09/24/2021 by his family after they were unable to take care of him.  He was drinking a lot of water and trying to burn things. Previously he was staying at group home and was kicked out.   Neurology and psychiatry were consulted.   Hospital course remarkable for acute on chronic hyponatremia, breakthrough seizures. Prolonged hospitalization.   TOC following.  APS in process to determine if they will pursue guardianship    Patient's behaviors have been stable.  Process was put in place to allow patient to briefly leave the floor given long length of stay.  Criteria were as follows: Must notify the nurse when he was leaving the floor, would not be allowed to leave the floor for longer than 30 minutes and he was not allowed to leave hospital grounds.  If any 1 of these criteria were broken off unit privileges would be revoked.  It is noted that eventual discharge plan would be to a group home although 24/7 monitoring of fluid intake would be difficult in that setting.  On the evening of 2/26 patient developed a grand mal seizure and it was noted his sodium had decreased significantly to 117.  It was felt that patient had been drinking fluids in his room and off the unit leading to hypervolemic hyponatremia.  He has a history of polydipsia and has had seizures related to this in the past.  He was placed on hypertonic saline and transferred to the ICU service.  Of note patient had been on Lasix, total fluid restriction versus only free water restriction and salt tablets.  As of now Lasix has been discontinued.  Fluid restriction pertains to only free water  and prior to ICU team assuming care orders changed to allow pt to consume Gatorade and other electrolyte-based fluids to help with excessive thirst.    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  Sodium trend: 125> 129> 133 > 117> 116> 118> 117> 119> 125> 127> 130 > 134 > 133 Required 3% saline for correction **2/26 patient had seizure activity in context of hyponatremia DCd Lasix, continue fluid restriction but of free water only and sodium tablets will provide Gatorade in the room to help with thirst and Gatorade will not be included in the fluid restriction volumes. CK elevated but slow to decrease so not likely related only to recent seizure activity and suspect a degree of volume depletion.  Patient had been receiving normal saline IV fluids with sodium has trended downward slightly so at this time we will stop IV fluids and encourage patient to drink non free water based fluids frequently Follow-up labs  Seizure disorder (HCC) Continue Depakote-valproic acid level 41 previous level on 2/10 was 0.2 History of nonadherence and valproic acid on 1/22 was 19 EEG negative for acute seizure activity Continue seizure precautions Depakote can also function as a mood stabilizer MRI revealed arachnoid cyst that is chronic in nature S/p  grand  mal seizure with postictal phase on the evening of 2/26 presumed to be precipitated/provoked by abrupt hyponatremia with sodium less than 120.  Valproic acid level 58  Elevated CK Secondary to recent seizure activity as well as volume depletion Encourage oral intake of fluid Follow CK If CK increases on oral fluids alone will need to resume IV fluid  Paranoid schizophrenia with  hallucinations- (present on admission) Appreciate psychiatric team help Schizophrenia symptoms and decompensation worsened by recurrent hyponatremia Also has a component of anxiety and during previous admission requested antianxiety medications Continue Seroquel and Invega  Chronic systolic CHF (congestive heart failure) (HCC)- (present on admission) Appears euvolemic Continue entresto, digoxin and lasix.  Digoxin level subtherapeutic as of 2/10 but this also appears to be a chronic issue.  Follow periodically Strict I/O and daily weights Fluid restriction for chronic hyponatremia Last echo: 5/21 with EF of 25-30% with global hypokinesis, RV overload and indeterminate diastolic dysfunction. Does not appear that he has followed up with cardiology. Gets meds from ED.  Echocardiogram performed on 09/26/2021 EF of 30-35% with moderately decreased systolic function and global hypokinesis. RV size and systolic function is normal. Weight measurements have been inconsistent and appear to be inaccurate  Dementia without behavioral disturbance (HCC)- (present on admission) During initial admission over 1 year ago had formal neuropsychiatric evaluation by Dr. Kieth Brightly who diagnosed patient with dementia likely related to years of schizophrenia and nonadherence to medication regimen SLUMS evaluation worsened since previous admission noting has declined from 65 to 7.  He lacks capacity to make decisions APS investigating guardianship noting no other family members or friends are willing to pursue guardianship     Peripheral neuropathy Chronic issue and was present during previous admission Likely related to years of substance abuse Continue low-dose Lyrica since was effective previous  A physical therapy consult is indicated based on the patients mobility assessment.   Mobility Assessment (last 72 hours)     Mobility Assessment     Row Name 11/01/21 0730 10/31/21 0730 10/30/21 1128 10/29/21  2100 10/29/21 0808   Does patient have an order for bedrest or is patient medically unstable No - Continue assessment No - Continue assessment No - Continue assessment No - Continue assessment No - Continue assessment   What is the highest level of mobility based on the progressive mobility assessment? Level 6 (Walks independently in room and hall) - Balance while walking in room without assist - Complete Level 6 (Walks independently in room and hall) - Balance while walking in room without assist - Complete Level 6 (Walks independently in room and hall) - Balance while walking in room without assist - Complete Level 6 (Walks independently in room and hall) - Balance while walking in room without assist - Complete Level 5 (Walks with assist in room/hall) - Balance while stepping forward/back and can walk in room with assist - Complete             Mixed hyperlipidemia- (present on admission) Continue lipitor Apparently has been nonadherent with medicines prior to admission  COPD (chronic obstructive pulmonary disease) (HCC)- (present on admission) Compensated Given Pneumovax immunization this admission  Constipation No bowel movement for several days Nursing states patient has been refusing preventative laxatives-as of 2/23 patient is agreeing to take stool softeners and laxative Give one-time dose of milk of magnesia to promote bowel movement        Subjective:   Physical Exam: Vitals:   11/01/21 0700 11/01/21 0800 11/01/21 0844 11/01/21 0902  BP: (!) 146/82  118/73   Pulse: 82  99 84  Resp: 11     Temp:  98.5 F (36.9 C)    TempSrc:  Oral    SpO2: 97%     Weight:      Height:       Gen: No acute distress, calm  Pulm: Anterior lung sounds clear to auscultation, on room air, no increased work of breathing Card: S1-S2, normotensive, no peripheral edema Abd: Soft nontender nondistended with normal active bowel sounds.  Eating well.  LBM 2/27 Neuro: Cranial nerves II  through XII grossly intact, moves all extremities x4 without any focal neurological deficits.  Sensation intact.  Mobilizes independently Psych: Alert and oriented x3 but still lacks insight into mental disabilities that prevent patient from managing care safely and independently in the home environment    Data Reviewed:  Results reviewed  Family Communication:  Patient  Disposition: Remains inpatient appropriate because:  Unsafe DC plan  Planned Discharge Destination:  Barriers to discharge: APS to determine if they will pursue guardianship. Family refusing to take pt because stating they cannot manage him noting refuses to take meds  Medically stable Yes  COVID vaccination status:  [employer] 07/20/2020  Consultants: Psychiatry Neurology Procedures: EEG Echocardiogram Antibiotics: None     Time spent: 15 minutes  Author: Junious Silk, NP 11/01/2021 10:50 AM  For on call review [email or web address].

## 2021-11-01 NOTE — Progress Notes (Signed)
PROGRESS NOTE  Jim Dunn  DOB: 08-08-1966  PCP: Aviva Kluver [id number]  DOA: 09/24/2021  LOS: 33 days  Hospital Day: 39  Brief narrative: Jim Dunn is a 56 y.o. male with PMH significant for schizophrenia, chronic hyponatremia, seizure disorder, tobacco use, systolic congestive heart failure, COPD who was brought to ED on 09/24/2021 by his family after they were unable to take care of him.  He was drinking a lot of water and trying to burn things. Previously he was staying at group home and was kicked out.   Neurology and psychiatry were consulted.   Hospital course remarkable for acute on chronic hyponatremia, breakthrough seizures. Prolonged hospitalization.   TOC following.  APS in process to determine if they will pursue guardianship  Patient has psychogenic polydipsia and keeps on wandering between rooms and units in the hospital and source of free water.  He has history of seizures in the past as well related to hypervolemic hyponatremia. During the course of his hospitalization for the last 5 weeks, he was placed on salt tablets, Lasix fluid restriction but he continues to disregard the recommendation drink free water.  On the evening of 2/26 patient had an episode of witnessed grand mal seizure. Labs showed sodium level acutely dropped from 133 to 117 in 36 hours.  He was started on 3% normal saline transferred to ICU and monitored for 24 hours.   With clinical improvement, he was sent back to [facility].  Subjective: Patient was seen and examined this morning.  Pleasant middle-aged African-American male.  Sitting up in bed.  Not in distress.  Alert, awake.we discussed about his wandering behavior.  I suggested him not to repeat it and not to overdrink fluid.  He stated he understands and agrees without any resistance or hesitation.  I am not sure if he was comprehending well and will stick to the recommendation. No fever last 24 hours, heart rate in 80s, blood pressure in 90s  overnight, 140s this morning.   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 -Monitored in ICU for 24 hours -Mental status back to baseline now -Continue Depakote 500 mg twice daily, and Lyrica 25 mg 3 times daily.  Ativan as needed  Chronic hypervolemic hyponatremia Psychogenic polydipsia -Sodium trend as below.  Last sodium level 131 this morning. -During this hospitalization, he has been tried on salt tablets, fluid restriction, Lasix.  But he does not follow the recommendation of fluid restriction. -Currently Lasix is on hold. -Continue salt tablet 2 g twice daily -fluid restriction at 1500 mL/day. Recent Labs  Lab 10/30/21 2008 10/30/21 2225 10/31/21 0050 10/31/21 0223 10/31/21 0726 10/31/21 1255 10/31/21 1618 10/31/21 2000 10/31/21 2321 11/01/21 0247  NA 117* 116* 118* 117* 119* 125* 127* 130* 134* 131*   AKI Rhabdomyolysis -With the new shoes probably because of seizures. -Creatinine improved with hydration.  CK level improving.  Continue to monitor. 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   -Echo 1/23 with EF 30-35% with moderately decreased systolic function and global hypokinesis. RV size and systolic function is normal. -Currently on Coreg 3.125 mg twice daily, digoxin 0.25 mg daily, Entresto 24/26 mg twice daily.  Lasix on hold at this time.  COPD -As needed DuoNeb  HLD -Continue Lipitor  Constipation -Bowel regimen: Senokot twice daily, MiraLAX as needed  Peripheral neuropathy -Lyrica  Goals of care   Code Status: Full Code   Nutritional status:  Body mass index is 34.19 kg/m.          Diet:  Diet Order             Diet regular Room service appropriate? No; Fluid consistency: Thin; Fluid restriction: 1500 mL Fluid  Diet effective now                   DVT prophylaxis:  enoxaparin (LOVENOX) injection 40 mg Start: 09/25/21 1800   Antimicrobials: None Fluid: None Consultants: PCCM Family Communication: None at bedside.   Status is: Inpatient  Continue in-hospital care because: Pending safe discharge plan Level of care: Progressive   Dispo: The patient is from: Home              Anticipated d/c is to: Pending safe discharge plan.  APS involved              Patient currently is not medically stable to d/c.   Difficult to place patient No     Infusions:    Scheduled Meds:  atorvastatin  40 mg Oral Daily   carvedilol  3.125 mg Oral BID WC   Chlorhexidine Gluconate Cloth  6 each Topical Daily   digoxin  0.25 mg Oral Daily   divalproex  500 mg Oral BID   enoxaparin (LOVENOX) injection  40 mg Subcutaneous Q24H   mouth rinse  15 mL Mouth Rinse BID   melatonin  3 mg  Oral QHS   mirtazapine  15 mg Oral QHS   nicotine  14 mg Transdermal Daily   paliperidone  3 mg Oral Daily   pneumococcal 23 valent vaccine  0.5 mL Intramuscular Tomorrow-1000   pregabalin  25 mg Oral TID   QUEtiapine  50 mg Oral QHS   sacubitril-valsartan  1 tablet Oral BID   senna-docusate  2 tablet Oral BID   sodium chloride  2 g Oral BID WC    PRN meds: acetaminophen **OR** acetaminophen, haloperidol lactate, hydrALAZINE, hydrOXYzine, ipratropium-albuterol, LORazepam, polyethylene glycol   Antimicrobials: Anti-infectives (From admission, onward)    None       Objective: Vitals:   11/01/21 1100 11/01/21 1200  BP: (!) 154/94 125/75  Pulse: 76   Resp: 17 17  Temp:    SpO2: 96% 94%    Intake/Output Summary (Last 24 hours) at 11/01/2021 1331 Last data filed at 11/01/2021 0900  Gross per 24 hour  Intake 2340.42 ml  Output 3000 ml  Net -659.58 ml   Filed Weights   10/30/21 2223 10/31/21 0357 11/01/21 0500  Weight: 103 kg 102.6 kg 102 kg   Weight change: -1 kg Body mass index is 34.19 kg/m.   Physical Exam: General exam: Middle-aged African-American male.  Not in physical distress Skin: No rashes, lesions or ulcers. HEENT: Atraumatic, normocephalic, no obvious bleeding Lungs: Clear to auscultation bilaterally CVS: Regular rate and rhythm, no murmur GI/Abd soft, nontender, nondistended, bowel sounds CNS: Alert, awake, oriented to place and person Psychiatry: Mood appropriate Extremities: No pedal edema, no calf tenderness  Data Review: I have personally reviewed the laboratory data and studies available.  F/u labs ordered Unresulted Labs (From admission, onward)     Start     Ordered   11/02/21 0500  Basic metabolic panel  Daily,   R     Question:  Specimen collection method  Answer:  Lab=Lab collect   11/01/21 0754   10/31/21 0811  Culture, blood (routine x 2)  BLOOD CULTURE X 2,   R      10/31/21 0811   10/31/21 0732  CK  Now then every 8 hours,   R  (with TIMED occurrences)     Question:  Specimen collection method  Answer:  Lab=Lab collect   10/31/21 0732            Signed, Lorin Glass, MD [facility] 11/01/2021

## 2021-11-01 NOTE — Progress Notes (Signed)
CSW sent psychiatry note from 10/05/2021 to [facility] at [facility] to support the petition she is filing for guardianship.  [name], MSW, LCSW Transitions of Care   Clinical Social Worker II [phone]

## 2021-11-02 DIAGNOSIS — E871 Hypo-osmolality and hyponatremia: Secondary | ICD-10-CM | POA: Diagnosis not present

## 2021-11-02 LAB — CK: Total CK: 1926 U/L — ABNORMAL HIGH (ref 49–397)

## 2021-11-02 LAB — BASIC METABOLIC PANEL
Anion gap: 8 (ref 5–15)
BUN: 8 mg/dL (ref 6–20)
CO2: 26 mmol/L (ref 22–32)
Calcium: 8.6 mg/dL — ABNORMAL LOW (ref 8.9–10.3)
Chloride: 96 mmol/L — ABNORMAL LOW (ref 98–111)
Creatinine, Ser: 0.66 mg/dL (ref 0.61–1.24)
GFR, Estimated: >60 mL/min (ref >60)
Glucose, Bld: 90 mg/dL (ref 70–99)
Potassium: 4.2 mmol/L (ref 3.5–5.1)
Sodium: 130 mmol/L — ABNORMAL LOW (ref 135–145)

## 2021-11-02 MED ORDER — ALPRAZOLAM 0.25 MG PO TABS
0.2500 mg | ORAL_TABLET | Freq: Two times a day (BID) | ORAL | Status: DC
  Administered 2021-11-02 – 2021-11-15 (×27): 0.25 mg via ORAL
  Filled 2021-11-02 (×27): qty 1

## 2021-11-02 MED ORDER — SACUBITRIL-VALSARTAN 24-26 MG PO TABS: 1.0000 | ORAL_TABLET | Freq: Two times a day (BID) | ORAL | Status: DC

## 2021-11-02 NOTE — Progress Notes (Signed)
Progress Note   Patient: Jim Dunn [id number] DOB: [birth year] DOA: 09/24/2021     34 DOS: the patient was seen and examined on 11/02/2021   Brief hospital course: Nouri Brusky is a 56 y.o. male with PMH significant for schizophrenia, chronic hyponatremia, seizure disorder, tobacco use, systolic congestive heart failure, COPD who was brought to ED on 09/24/2021 by his family after they were unable to take care of him.  He was drinking a lot of water and trying to burn things. Previously he was staying at group home and was kicked out.   Neurology and psychiatry were consulted.   Hospital course remarkable for acute on chronic hyponatremia, breakthrough seizures. Prolonged hospitalization.   TOC following.  APS in process to determine if they will pursue guardianship    Patient's behaviors have been stable.  Process was put in place to allow patient to briefly leave the floor given long length of stay.  Criteria were as follows: Must notify the nurse when he was leaving the floor, would not be allowed to leave the floor for longer than 30 minutes and he was not allowed to leave hospital grounds.  If any 1 of these criteria were broken off unit privileges would be revoked.  It is noted that eventual discharge plan would be to a group home although 24/7 monitoring of fluid intake would be difficult in that setting.  On the evening of 2/26 patient developed a grand mal seizure and it was noted his sodium had decreased significantly to 117.  It was felt that patient had been drinking fluids in his room and off the unit leading to hypervolemic hyponatremia.  He has a history of polydipsia and has had seizures related to this in the past.  He was placed on hypertonic saline and transferred to the ICU service.  Of note patient had been on Lasix, total fluid restriction versus only free water restriction and salt tablets.  As of now Lasix has been discontinued.  Fluid restriction pertains to only free water  and prior to ICU team assuming care orders changed to allow pt to consume Gatorade and other electrolyte-based fluids to help with excessive thirst.    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  Sodium trend: 125> 129> 133 > 117> 116> 118> 117> 119> 125> 127> 130 > 134 > 133 >> 130 Required 3% saline for correction **2/26 patient had seizure activity in context of hyponatremia DCd Lasix, continue fluid restriction but of free water only and sodium tablets will provide Gatorade in the room to help with thirst and Gatorade will not be included in the fluid restriction volumes. Continue to follow CK.  Likely related to recent grand mal seizure.  As of 3/1 is down to 1926 3/1 Na 130  Seizure disorder (HCC) Continue Depakote-valproic acid level 41 previous level on 2/10 was 0.2 History of nonadherence and valproic acid on 1/22 was 19 EEG negative for acute seizure activity Continue seizure precautions Depakote can also function as a mood stabilizer MRI revealed arachnoid cyst that is chronic in nature S/p  grand mal seizure with postictal phase on the evening of 2/26 presumed to be precipitated/provoked by abrupt hyponatremia with sodium less than 120.  Valproic acid level 58  Elevated CK Secondary to recent seizure  activity as well as volume depletion Encourage oral intake of fluid CK trend is downward.  On 2/28 CK was 2298 and as of 3/1 is down to 1926.  We will follow daily.  Paranoid schizophrenia with hallucinations- (present on admission) Appreciate psychiatric team help Schizophrenia symptoms and decompensation worsened by recurrent hyponatremia Also has a component of anxiety and during previous admission requested  antianxiety medications Continue Seroquel and Invega Discontinuing Vistaril in favor of Xanax which has previously been well effective for this patient  Chronic systolic CHF (congestive heart failure) ([place])- (present on admission) Appears euvolemic Continue digoxin  Lasix discontinued during recent episode of acute hyponatremia with seizure activity as well as hypotension we will continue to hold until CK normalized While blood pressure was low Entresto was placed on hold by PCCM.  Patient now significantly hypertensive as of 3/1 so we will resume Entresto at previous dose Digoxin level subtherapeutic as of 2/10 but this also appears to be a chronic issue.  Follow periodically Strict I/O and daily weights Fluid restriction for chronic hyponatremia Last echo: 5/21 with EF of 25-30% with global hypokinesis, RV overload and indeterminate diastolic dysfunction. Does not appear that he has followed up with cardiology. Gets meds from ED.  Echocardiogram performed on 09/26/2021 EF of 30-35% with moderately decreased systolic function and global hypokinesis. RV size and systolic function is normal. Weight measurements have been inconsistent and appear to be inaccurate  Dementia without behavioral disturbance ([place])- (present on admission) During initial admission over 1 year ago had formal neuropsychiatric evaluation by Dr. Sima Matas who diagnosed patient with dementia likely related to years of schizophrenia and nonadherence to medication regimen SLUMS evaluation worsened since previous admission noting has declined from 41 to 58.  He lacks capacity to make decisions APS investigating guardianship noting no other family members or friends are willing to pursue guardianship Had previously initiated Vistaril for patient's anxiety issues.  Not effective.  During previous hospitalization I used Xanax 0.25 mg BID and this was more effective with the patient therefore I have transition to Xanax as of  3/1     Peripheral neuropathy Chronic issue and was present during previous admission Likely related to years of substance abuse Continue low-dose Lyrica since was effective previous  A physical therapy consult is indicated based on the patients mobility assessment.   Mobility Assessment (last 72 hours)     Mobility Assessment     Row Name 11/01/21 0730 10/31/21 0730 10/30/21 1128 10/29/21 2100 10/29/21 0808   Does patient have an order for bedrest or is patient medically unstable No - Continue assessment No - Continue assessment No - Continue assessment No - Continue assessment No - Continue assessment   What is the highest level of mobility based on the progressive mobility assessment? Level 6 (Walks independently in room and hall) - Balance while walking in room without assist - Complete Level 6 (Walks independently in room and hall) - Balance while walking in room without assist - Complete Level 6 (Walks independently in room and hall) - Balance while walking in room without assist - Complete Level 6 (Walks independently in room and hall) - Balance while walking in room without assist - Complete Level 5 (Walks with assist in room/hall) - Balance while stepping forward/back and can walk in room with assist - Complete             Mixed hyperlipidemia- (present on admission) Continue lipitor Apparently has been nonadherent with medicines prior to admission  COPD (  chronic obstructive pulmonary disease) ([place])- (present on admission) Compensated Given Pneumovax immunization this admission  Constipation-resolved as of 11/02/2021 No bowel movement for several days Nursing states patient has been refusing preventative laxatives-as of 2/23 patient is agreeing to take stool softeners and laxative Give one-time dose of milk of magnesia to promote bowel movement        Subjective:  Significant Time spent at bedside educating patient regarding necessity of adhering to free water  fluid restriction.  Educated regarding more appropriate fluids to drink including noncaffeinated coffee.  Explained that too much fluid will make the salt in his blood drop and can cause a seizure.  He also reported that the Vistaril was not as effective as the Xanax which I had given him during a previous admission to help with his anxiety and restlessness.  I told him I would transition back to Xanax   Physical Exam: Vitals:   11/01/21 1742 11/01/21 2014 11/01/21 2343 11/02/21 0725  BP: (!) 157/89 (!) 165/95 (!) 139/96 (!) 173/106  Pulse: 71 77 80 70  Resp: 16 18 17 18   Temp: 98.2 F (36.8 C) 99.7 F (37.6 C) 99.1 F (37.3 C) 97.8 F (36.6 C)  TempSrc: Oral Oral Oral Oral  SpO2: 100% 98% 97% 100%  Weight:      Height:       Gen: No acute distress, calm  Pulm: Anterior lung sounds clear to auscultation, on room air, no increased work of breathing Card: S1-S2, normotensive, no peripheral edema Abd: Soft nontender nondistended with normal active bowel sounds.  Eating well.  LBM 2/28 Neuro: Cranial nerves II through XII grossly intact, moves all extremities x4 without any focal neurological deficits.  Sensation intact.  Mobilizes independently Psych: Alert and oriented x3 but still lacks insight into mental disabilities that prevent patient from managing care safely and independently in the home environment    Data Reviewed:  Results reviewed  Family Communication:  Patient  Disposition: Remains inpatient appropriate because:  Unsafe DC plan  Planned Discharge Destination:  Barriers to discharge: APS to determine if they will pursue guardianship. Family refusing to take pt because stating they cannot manage him noting refuses to take meds  Medically stable Yes  COVID vaccination status:  [place] 07/20/2020  Consultants: Psychiatry Neurology Procedures: EEG Echocardiogram Antibiotics: None     Time spent: 20 minutes  Author: Erin Hearing, NP 11/02/2021 10:55  AM  For on call review [email or web address].

## 2021-11-02 NOTE — Progress Notes (Signed)
Brief nutrition note ? ?RD received page/secure chat from RN requesting gatorade be ordered for pt. Note pt already with order for gatorade (placed 2/27), so RD adjusted order to reflect that pt can still receive these beverages. ? ? ?Rae Lips., MS, RD, LDN (she/her/hers) ?RD pager number and weekend/on-call pager number located in Amion. ? ?

## 2021-11-03 DIAGNOSIS — E871 Hypo-osmolality and hyponatremia: Secondary | ICD-10-CM | POA: Diagnosis not present

## 2021-11-03 LAB — BASIC METABOLIC PANEL
Anion gap: 8 (ref 5–15)
BUN: 8 mg/dL (ref 6–20)
CO2: 28 mmol/L (ref 22–32)
Calcium: 8.8 mg/dL — ABNORMAL LOW (ref 8.9–10.3)
Chloride: 93 mmol/L — ABNORMAL LOW (ref 98–111)
Creatinine, Ser: 0.72 mg/dL (ref 0.61–1.24)
GFR, Estimated: >60 mL/min (ref >60)
Glucose, Bld: 99 mg/dL (ref 70–99)
Potassium: 4.1 mmol/L (ref 3.5–5.1)
Sodium: 129 mmol/L — ABNORMAL LOW (ref 135–145)

## 2021-11-03 LAB — CK: Total CK: 1616 U/L — ABNORMAL HIGH (ref 49–397)

## 2021-11-03 NOTE — Plan of Care (Signed)
Pt has been doing well. He has walked in the hall with the sitter and independently in the room. Pt has had 1 PRN medication for his BP.  ? ?Problem: Education: ?Goal: Knowledge of General Education information will improve ?Description: Including pain rating scale, medication(s)/side effects and non-pharmacologic comfort measures ?Outcome: Progressing ?  ?Problem: Health Behavior/Discharge Planning: ?Goal: Ability to manage health-related needs will improve ?Outcome: Progressing ?  ?Problem: Clinical Measurements: ?Goal: Ability to maintain clinical measurements within normal limits will improve ?Outcome: Progressing ?  ?Problem: Education: ?Goal: Knowledge of General Education information will improve ?Description: Including pain rating scale, medication(s)/side effects and non-pharmacologic comfort measures ?Outcome: Progressing ?  ?Problem: Health Behavior/Discharge Planning: ?Goal: Ability to manage health-related needs will improve ?Outcome: Progressing ?  ?Problem: Clinical Measurements: ?Goal: Ability to maintain clinical measurements within normal limits will improve ?Outcome: Progressing ?Goal: Will remain free from infection ?Outcome: Progressing ?Goal: Diagnostic test results will improve ?Outcome: Progressing ?Goal: Respiratory complications will improve ?Outcome: Progressing ?Goal: Cardiovascular complication will be avoided ?Outcome: Progressing ?  ?Problem: Activity: ?Goal: Risk for activity intolerance will decrease ?Outcome: Progressing ?  ?Problem: Nutrition: ?Goal: Adequate nutrition will be maintained ?Outcome: Progressing ?  ?Problem: Coping: ?Goal: Level of anxiety will decrease ?Outcome: Progressing ?  ?Problem: Elimination: ?Goal: Will not experience complications related to bowel motility ?Outcome: Progressing ?Goal: Will not experience complications related to urinary retention ?Outcome: Progressing ?  ?Problem: Pain Managment: ?Goal: General experience of comfort will improve ?Outcome:  Progressing ?  ?Problem: Safety: ?Goal: Ability to remain free from injury will improve ?Outcome: Progressing ?  ?Problem: Skin Integrity: ?Goal: Risk for impaired skin integrity will decrease ?Outcome: Progressing ?  ?

## 2021-11-03 NOTE — Progress Notes (Signed)
Progress Note   Patient: Jim Dunn IOE:[id number] DOB: 1966/01/28 DOA: 09/24/2021     35 DOS: the patient was seen and examined on 11/03/2021   Brief hospital course: Torian Quintero is a 56 y.o. male with PMH significant for schizophrenia, chronic hyponatremia, seizure disorder, tobacco use, systolic congestive heart failure, COPD who was brought to ED on 09/24/2021 by his family after they were unable to take care of him.  He was drinking a lot of water and trying to burn things. Previously he was staying at group home and was kicked out.   Neurology and psychiatry were consulted.   Hospital course remarkable for acute on chronic hyponatremia, breakthrough seizures. Prolonged hospitalization.   TOC following.  APS in process to determine if they will pursue guardianship    Patient's behaviors have been stable.  Process was put in place to allow patient to briefly leave the floor given long length of stay.  Criteria were as follows: Must notify the nurse when he was leaving the floor, would not be allowed to leave the floor for longer than 30 minutes and he was not allowed to leave hospital grounds.  If any 1 of these criteria were broken off unit privileges would be revoked.  It is noted that eventual discharge plan would be to a group home although 24/7 monitoring of fluid intake would be difficult in that setting.  On the evening of 2/26 patient developed a grand mal seizure and it was noted his sodium had decreased significantly to 117.  It was felt that patient had been drinking fluids in his room and off the unit leading to hypervolemic hyponatremia.  He has a history of polydipsia and has had seizures related to this in the past.  He was placed on hypertonic saline and transferred to the ICU service.  Of note patient had been on Lasix, total fluid restriction versus only free water restriction and salt tablets.  As of now Lasix has been discontinued.  Fluid restriction pertains to only free water  and prior to ICU team assuming care orders changed to allow pt to consume Gatorade and other electrolyte-based fluids to help with excessive thirst.    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  Sodium trend: 125> 129> 133 > 117> 116> 118> 117> 119> 125> 127> 130 > 134 > 133 >> 130 > 129 3/2 past 24 hr intake fluids 1560 cc Required 3% saline for correction **2/26 patient had seizure activity in context of hyponatremia DCd Lasix, continue fluid restriction but of free water only and sodium tablets will provide Gatorade in the room to help with thirst and Gatorade will not be included in the fluid restriction volumes. Continue to follow CK.  Likely related to recent grand mal seizure.  As of 3/2 is down to 1616   Seizure disorder (HCC) Continue Depakote-valproic acid level 41 previous level on 2/10 was 0.2 History of nonadherence and valproic acid on 1/22 was 19 EEG negative for acute seizure activity Continue seizure precautions Depakote can also function as a mood stabilizer MRI revealed arachnoid cyst that is chronic in nature S/p  grand mal seizure with postictal phase on the evening of 2/26 presumed to be precipitated/provoked by abrupt hyponatremia with sodium less than 120.  Valproic acid level  58  Elevated CK Secondary to recent seizure activity as well as volume depletion Encourage oral intake of fluid CK trend is downward.  2298 > 1926 > 1616  Paranoid schizophrenia with hallucinations- (present on admission) Appreciate psychiatric team help Schizophrenia symptoms and decompensation worsened by recurrent hyponatremia Also has a component of anxiety and during previous admission requested antianxiety  medications Continue Seroquel and Invega Discontinuing Vistaril in favor of Xanax which has previously been well effective for this patient  Chronic systolic CHF (congestive heart failure) (HCC)- (present on admission) Continue digoxin and Entresto  Lasix discontinued during recent episode of acute hyponatremia with seizure activity as well as hypotension we will continue to hold until CK normalized Digoxin level subtherapeutic as of 2/10 but this also appears to be a chronic issue.  Follow periodically Strict I/O and daily weights Fluid restriction for chronic hyponatremia for free water primarily-allowing gatorade and other electrolyte based fluids to not be included in FR Last echo: 5/21 with EF of 25-30% with global hypokinesis, RV overload and indeterminate diastolic dysfunction. Does not appear that he has followed up with cardiology. Gets meds from ED.  Echocardiogram performed on 09/26/2021 EF of 30-35% with moderately decreased systolic function and global hypokinesis. RV size and systolic function is normal. Weight measurements have been inconsistent and appear to be inaccurate  Dementia without behavioral disturbance (HCC)- (present on admission) During initial admission over 1 year ago had formal neuropsychiatric evaluation by Dr. Kieth Brightly who diagnosed patient with dementia likely related to years of schizophrenia and nonadherence to medication regimen SLUMS evaluation worsened since previous admission noting has declined from 69 to 17.  He lacks capacity to make decisions APS investigating guardianship noting no other family members or friends are willing to pursue guardianship Had previously initiated Vistaril for patient's anxiety issues.  Not effective.  During previous hospitalization I used Xanax 0.25 mg BID and this was more effective with the patient therefore I have transition to Xanax as of 3/1     Peripheral neuropathy Chronic issue and was present during previous  admission Likely related to years of substance abuse Continue low-dose Lyrica since was effective previous  A physical therapy consult is indicated based on the patients mobility assessment.   Mobility Assessment (last 72 hours)     Mobility Assessment     Row Name 11/01/21 0730 10/31/21 0730 10/30/21 1128 10/29/21 2100 10/29/21 0808   Does patient have an order for bedrest or is patient medically unstable No - Continue assessment No - Continue assessment No - Continue assessment No - Continue assessment No - Continue assessment   What is the highest level of mobility based on the progressive mobility assessment? Level 6 (Walks independently in room and hall) - Balance while walking in room without assist - Complete Level 6 (Walks independently in room and hall) - Balance while walking in room without assist - Complete Level 6 (Walks independently in room and hall) - Balance while walking in room without assist - Complete Level 6 (Walks independently in room and hall) - Balance while walking in room without assist - Complete Level 5 (Walks with assist in room/hall) - Balance while stepping forward/back and can walk in room with assist - Complete             Mixed hyperlipidemia- (present on admission) Continue lipitor Apparently has been nonadherent with medicines prior to admission  COPD (chronic obstructive pulmonary disease) (HCC)- (present on admission) Compensated Given Pneumovax immunization this admission  Constipation-resolved as  of 11/02/2021 No bowel movement for several days Nursing states patient has been refusing preventative laxatives-as of 2/23 patient is agreeing to take stool softeners and laxative Give one-time dose of milk of magnesia to promote bowel movement        Subjective:  Sleeping soundly and did not awaken  Physical Exam: Vitals:   11/02/21 2330 11/03/21 0327 11/03/21 0500 11/03/21 0905  BP: 117/60 125/74  140/84  Pulse: 83 69  72  Resp: 18  18  19   Temp: 98 F (36.7 C) 97.7 F (36.5 C)  98.9 F (37.2 C)  TempSrc: Oral Oral  Oral  SpO2: 94% 96%  98%  Weight:   108.1 kg   Height:       Gen: No acute distress, calm --sleeping Pulm: Posterior lung sounds clear to auscultation, on room air, no increased work of breathing Card: S1-S2, normotensive, no peripheral edema Abd: Soft nontender nondistended with normal active bowel sounds.  Eating well.  LBM 2/28 Neuro: Cranial nerves II through XII grossly intact, moves all extremities x4 without any focal neurological deficits.  Sensation intact.  Mobilizes independently Psych: Alert and oriented x3 but still lacks insight into mental disabilities that prevent patient from managing care safely and independently in the home environment    Data Reviewed:  Results reviewed  Family Communication:  Patient  Disposition: Remains inpatient appropriate because:  Unsafe DC plan  Planned Discharge Destination:  Barriers to discharge: APS to determine if they will pursue guardianship. Family refusing to take pt because stating they cannot manage him noting refuses to take meds  Medically stable Yes  COVID vaccination status:  [employer] 07/20/2020  Consultants: Psychiatry Neurology Procedures: EEG Echocardiogram Antibiotics: None     Time spent: 20 minutes  Author: 07/22/2020, NP 11/03/2021 10:10 AM  For on call review [email or web address].

## 2021-11-04 DIAGNOSIS — E871 Hypo-osmolality and hyponatremia: Secondary | ICD-10-CM | POA: Diagnosis not present

## 2021-11-04 LAB — BASIC METABOLIC PANEL
Anion gap: 8 (ref 5–15)
BUN: 7 mg/dL (ref 6–20)
CO2: 25 mmol/L (ref 22–32)
Calcium: 8.9 mg/dL (ref 8.9–10.3)
Chloride: 96 mmol/L — ABNORMAL LOW (ref 98–111)
Creatinine, Ser: 0.59 mg/dL — ABNORMAL LOW (ref 0.61–1.24)
GFR, Estimated: >60 mL/min (ref >60)
Glucose, Bld: 93 mg/dL (ref 70–99)
Potassium: 4.1 mmol/L (ref 3.5–5.1)
Sodium: 129 mmol/L — ABNORMAL LOW (ref 135–145)

## 2021-11-04 LAB — CK: Total CK: 970 U/L — ABNORMAL HIGH (ref 49–397)

## 2021-11-04 NOTE — Plan of Care (Signed)
Pt is in bed resting. Pt has sitter present at bedside. Pt is able to ambulate to bathroom as needed. Pt denies pain. Pts BP was greater than 180, PRN Hydralazine given per order, effective.   ? ? ?Problem: Education: ?Goal: Knowledge of General Education information will improve ?Description: Including pain rating scale, medication(s)/side effects and non-pharmacologic comfort measures ?Outcome: Progressing ?  ?Problem: Health Behavior/Discharge Planning: ?Goal: Ability to manage health-related needs will improve ?Outcome: Progressing ?  ?Problem: Clinical Measurements: ?Goal: Ability to maintain clinical measurements within normal limits will improve ?Outcome: Progressing ?  ?Problem: Education: ?Goal: Knowledge of General Education information will improve ?Description: Including pain rating scale, medication(s)/side effects and non-pharmacologic comfort measures ?Outcome: Progressing ?  ?Problem: Health Behavior/Discharge Planning: ?Goal: Ability to manage health-related needs will improve ?Outcome: Progressing ?  ?Problem: Clinical Measurements: ?Goal: Ability to maintain clinical measurements within normal limits will improve ?Outcome: Progressing ?Goal: Will remain free from infection ?Outcome: Progressing ?Goal: Diagnostic test results will improve ?Outcome: Progressing ?Goal: Respiratory complications will improve ?Outcome: Progressing ?Goal: Cardiovascular complication will be avoided ?Outcome: Progressing ?  ?Problem: Activity: ?Goal: Risk for activity intolerance will decrease ?Outcome: Progressing ?  ?Problem: Nutrition: ?Goal: Adequate nutrition will be maintained ?Outcome: Progressing ?  ?Problem: Coping: ?Goal: Level of anxiety will decrease ?Outcome: Progressing ?  ?Problem: Elimination: ?Goal: Will not experience complications related to bowel motility ?Outcome: Progressing ?Goal: Will not experience complications related to urinary retention ?Outcome: Progressing ?  ?Problem: Pain  Managment: ?Goal: General experience of comfort will improve ?Outcome: Progressing ?  ?Problem: Safety: ?Goal: Ability to remain free from injury will improve ?Outcome: Progressing ?  ?Problem: Skin Integrity: ?Goal: Risk for impaired skin integrity will decrease ?Outcome: Progressing ?  ?

## 2021-11-04 NOTE — Plan of Care (Signed)
Pt is alert oriented x 4. No distress noted. Pt has sitter in room.  ? ? ?Problem: Education: ?Goal: Knowledge of General Education information will improve ?Description: Including pain rating scale, medication(s)/side effects and non-pharmacologic comfort measures ?Outcome: Progressing ?  ?Problem: Health Behavior/Discharge Planning: ?Goal: Ability to manage health-related needs will improve ?Outcome: Progressing ?  ?Problem: Clinical Measurements: ?Goal: Ability to maintain clinical measurements within normal limits will improve ?Outcome: Progressing ?  ?Problem: Education: ?Goal: Knowledge of General Education information will improve ?Description: Including pain rating scale, medication(s)/side effects and non-pharmacologic comfort measures ?Outcome: Progressing ?  ?Problem: Health Behavior/Discharge Planning: ?Goal: Ability to manage health-related needs will improve ?Outcome: Progressing ?  ?Problem: Clinical Measurements: ?Goal: Ability to maintain clinical measurements within normal limits will improve ?Outcome: Progressing ?Goal: Will remain free from infection ?Outcome: Progressing ?Goal: Diagnostic test results will improve ?Outcome: Progressing ?Goal: Respiratory complications will improve ?Outcome: Progressing ?Goal: Cardiovascular complication will be avoided ?Outcome: Progressing ?  ?Problem: Activity: ?Goal: Risk for activity intolerance will decrease ?Outcome: Progressing ?  ?Problem: Nutrition: ?Goal: Adequate nutrition will be maintained ?Outcome: Progressing ?  ?Problem: Coping: ?Goal: Level of anxiety will decrease ?Outcome: Progressing ?  ?Problem: Elimination: ?Goal: Will not experience complications related to bowel motility ?Outcome: Progressing ?Goal: Will not experience complications related to urinary retention ?Outcome: Progressing ?  ?Problem: Pain Managment: ?Goal: General experience of comfort will improve ?Outcome: Progressing ?  ?Problem: Safety: ?Goal: Ability to remain free from  injury will improve ?Outcome: Progressing ?  ?Problem: Skin Integrity: ?Goal: Risk for impaired skin integrity will decrease ?Outcome: Progressing ?  ?
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
## 2021-11-05 DIAGNOSIS — E871 Hypo-osmolality and hyponatremia: Secondary | ICD-10-CM | POA: Diagnosis not present

## 2021-11-05 DIAGNOSIS — E782 Mixed hyperlipidemia: Secondary | ICD-10-CM

## 2021-11-05 DIAGNOSIS — R748 Abnormal levels of other serum enzymes: Secondary | ICD-10-CM

## 2021-11-05 DIAGNOSIS — K5900 Constipation, unspecified: Secondary | ICD-10-CM | POA: Diagnosis not present

## 2021-11-05 DIAGNOSIS — I5022 Chronic systolic (congestive) heart failure: Secondary | ICD-10-CM | POA: Diagnosis not present

## 2021-11-05 DIAGNOSIS — F0390 Unspecified dementia without behavioral disturbance: Secondary | ICD-10-CM

## 2021-11-05 LAB — BASIC METABOLIC PANEL
Anion gap: 9 (ref 5–15)
BUN: 13 mg/dL (ref 6–20)
CO2: 26 mmol/L (ref 22–32)
Calcium: 8.9 mg/dL (ref 8.9–10.3)
Chloride: 93 mmol/L — ABNORMAL LOW (ref 98–111)
Creatinine, Ser: 0.61 mg/dL (ref 0.61–1.24)
GFR, Estimated: >60 mL/min (ref >60)
Glucose, Bld: 103 mg/dL — ABNORMAL HIGH (ref 70–99)
Potassium: 4.3 mmol/L (ref 3.5–5.1)
Sodium: 128 mmol/L — ABNORMAL LOW (ref 135–145)

## 2021-11-05 LAB — CULTURE, BLOOD (ROUTINE X 2)
Culture: NO GROWTH
Culture: NO GROWTH

## 2021-11-05 LAB — CK: Total CK: 664 U/L — ABNORMAL HIGH (ref 49–397)

## 2021-11-05 NOTE — Progress Notes (Signed)
Patient doesn't have any IV medications at this time. Informed patient's RN Nicole Cella) regarding this matter. Patient has been in the hospital for 42 days, no need to have PIV access for seizure precaution. Dorothy RN will ask MD regarding PIV access. She will put in the order if patient needs it. HS Nedra Hai RN ?

## 2021-11-05 NOTE — Progress Notes (Signed)
PROGRESS NOTE    Jim Dunn  JME:[id number] DOB: [birth year] DOA: 09/24/2021 PCP: Oneita Hurt, No   Brief Narrative:  Jim Dunn is a 56 y.o. male with PMH significant for schizophrenia, chronic hyponatremia, seizure disorder, tobacco use, systolic congestive heart failure, COPD who was brought to ED on 09/24/2021 by his family after they were unable to take care of him.  He was drinking a lot of water and trying to burn things. Previously he was staying at group home and was kicked out.   Neurology and psychiatry were consulted.   Hospital course remarkable for acute on chronic hyponatremia, breakthrough seizures. Prolonged hospitalization.   TOC following.  APS in process to determine if they will pursue guardianship     Patient's behaviors have been stable.  Process was put in place to allow patient to briefly leave the floor given long length of stay.  Criteria were as follows: Must notify the nurse when he was leaving the floor, would not be allowed to leave the floor for longer than 30 minutes and he was not allowed to leave hospital grounds.  If any 1 of these criteria were broken off unit privileges would be revoked.  It is noted that eventual discharge plan would be to a group home although 24/7 monitoring of fluid intake would be difficult in that setting.   On the evening of 2/26 patient developed a grand mal seizure and it was noted his sodium had decreased significantly to 117.  It was felt that patient had been drinking fluids in his room and off the unit leading to hypervolemic hyponatremia.  He has a history of polydipsia and has had seizures related to this in the past.  He was placed on hypertonic saline and transferred to the ICU service.  Of note patient had been on Lasix, total fluid restriction versus only free water restriction and salt tablets.  As of now Lasix has been discontinued.  Fluid restriction pertains to only free water and prior to ICU team assuming care orders changed to  allow pt to consume Gatorade and other electrolyte-based fluids to help with excessive thirst.   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  Sodium trend: 125> 129> 133 > 117> 116> 118> 117> 119> 125> 127> 130 > 134 > 133 >> 130 > 129 > 129 > 130 As noted previously, Required 3% saline for correction **2/26 patient had seizure activity in context of hyponatremia Continue to hold Lasix.  Previously patient was placed on the as needed Lasix schedule but this may not be an option out of group home Continue fluid restriction but of free water only and sodium tablets will provide Gatorade in the room to help with thirst and Gatorade will not be included in the fluid restriction volumes. Continue to follow CK.  Likely related to recent grand mal seizure.  As of 3/4 is down to 664 Of  note patient reports less desire to drink as many fluids since the benzodiazepines have been started.  It is important to note that during his previous Long length of stay hospitalization when I cared for him I had placed him on benzodiazepines and wean did not have any issues with psychogenic polydipsia during the hospitalization.     Seizure disorder (HCC) Continue Depakote-valproic acid level 41 previous level on 2/10 was 0.2 History of nonadherence and valproic  acid on 1/22 was 19 EEG negative for acute seizure activity Continue seizure precautions Depakote can also function as a mood stabilizer MRI revealed arachnoid cyst that is chronic in nature S/p  grand mal seizure with postictal phase on the evening of 2/26 presumed to be precipitated/provoked by abrupt hyponatremia with sodium less than 120.  Valproic acid level 58   Elevated CK Secondary to recent seizure activity as well as volume depletion Encourage oral intake of electrolyte based fluids CK trend is downward.   Paranoid schizophrenia with hallucinations Appreciate psychiatric team help Schizophrenia symptoms and decompensation worsened by recurrent hyponatremia Also has a component of anxiety and during previous admission requested antianxiety medications Continue Seroquel and Invega Discontinuing Vistaril in favor of Xanax which has previously been well effective for this patient   Chronic systolic CHF (congestive heart failure) (HCC) Continue digoxin and Entresto  Lasix discontinued during recent episode of acute hyponatremia with seizure activity as well as hypotension we will continue to hold until CK normalized Digoxin level subtherapeutic as of 2/10 but this also appears to be a chronic issue.  Follow periodically Strict I/O and daily weights Fluid restriction for chronic hyponatremia for free water primarily-allowing gatorade and other electrolyte based fluids to not be included in FR Last echo: 5/21 with EF of 25-30% with global hypokinesis, RV overload and indeterminate diastolic dysfunction. Does not appear that he has followed up with cardiology. Gets meds from ED.  Echocardiogram performed on 09/26/2021 EF of 30-35% with moderately decreased systolic function and global hypokinesis. RV size and systolic function is normal. Weight measurements have been inconsistent and appear to be inaccurate   Dementia without behavioral disturbance (HCC) During initial admission over 1  year ago had formal neuropsychiatric evaluation by Dr. Kieth Brightly who diagnosed patient with dementia likely related to years of schizophrenia and nonadherence to medication regimen SLUMS evaluation worsened since previous admission noting has declined from 89 to 74.  He lacks capacity to make decisions APS investigating guardianship noting no other family members or friends are willing to pursue guardianship Had previously initiated Vistaril for patient's anxiety issues.  Not effective.  During previous hospitalization I used Xanax 0.25 mg BID and this was more effective with the patient therefore I have transition to Xanax as of 3/1    Peripheral neuropathy Chronic issue and was present during previous admission Likely related to years of substance abuse Continue low-dose Lyrica since was effective previous  Mixed hyperlipidemia Continue lipitor Apparently has been nonadherent with medicines prior to admission   COPD (chronic obstructive pulmonary disease) (HCC) Compensated Given Pneumovax immunization this admission   Constipation-resolved as of 11/02/2021 No bowel movement for several days Nursing states patient has been refusing preventative laxatives-as of 2/23 patient is agreeing to take stool softeners and laxative Give one-time dose of milk of magnesia to promote bowel movement  DVT prophylaxis: SCD/Compression stockings  Code Status: Full    Code Status Orders  (From admission, onward)           Start  Ordered   09/25/21 1753  Full code  Continuous        09/25/21 1755           Code Status History     Date Active Date Inactive Code Status Order ID Comments User Context   09/11/2021 1530 09/12/2021 2233 Full Code [id number]  Jeanella Flattery ED   09/04/2021 1541 09/05/2021 2059 Full Code [id number]  Linwood Dibbles, MD ED   07/30/2020 0830 07/31/2020 0805 Full Code [id number]  Derwood Kaplan, MD ED   07/21/2020 0453 07/21/2020 1702 Full Code [id number]  Dione Booze, MD ED   02/23/2020 2301 06/08/2020 1642 Full Code [id number]  Marinda Elk, MD ED   02/19/2020 2344 02/21/2020 1252 Full Code [id number]  Briscoe Deutscher, MD ED   01/22/2020 0758 01/28/2020 0134 Full Code [id number]  Clydie Braun, MD ED      Family Communication: Patient today Disposition Plan:   placement pending stabilization of na Consults called: None Admission status: Inpatient   Consultants:  Psych, neuro  Procedures:  DG Chest 1 View  Result Date: 10/30/2021 CLINICAL DATA:  Altered mental status.  Pneumonia. EXAM: CHEST  1 VIEW COMPARISON:  Chest radiograph dated 07/26/2021. FINDINGS: No focal consolidation, pleural effusion or pneumothorax. The cardiac silhouette is within limits. No acute osseous pathology. IMPRESSION: No active cardiopulmonary disease. Electronically Signed   By: Elgie Collard M.D.   On: 10/30/2021 23:15   CT HEAD WO CONTRAST ( )  Result Date: 10/31/2021 CLINICAL DATA:  Sudden severe headache EXAM: CT HEAD WITHOUT CONTRAST TECHNIQUE: Contiguous axial images were obtained from the base of the skull through the vertex without intravenous contrast. RADIATION DOSE REDUCTION: This exam was performed according to the departmental dose-optimization program which includes automated exposure control, adjustment of the mA and/or kV according to patient size and/or use of iterative reconstruction technique. COMPARISON:  02/23/2020 FINDINGS: Brain: There is no mass, hemorrhage or extra-axial collection. Unchanged large arachnoid cyst of the posterior right hemisphere. Vascular: No abnormal hyperdensity of the major intracranial arteries or dural venous sinuses. No intracranial atherosclerosis. Skull: The visualized skull base, calvarium and extracranial soft tissues are normal. Sinuses/Orbits: No fluid levels or advanced mucosal thickening of the visualized paranasal sinuses. No mastoid or middle ear effusion. The orbits are normal. IMPRESSION: 1. No acute  intracranial abnormality. 2. Unchanged large arachnoid cyst of the posterior right hemisphere. Electronically Signed   By: Deatra Robinson M.D.   On: 10/31/2021 03:56   DG CHEST PORT 1 VIEW  Result Date: 10/31/2021 CLINICAL DATA:  Aspiration pneumonia EXAM: PORTABLE CHEST 1 VIEW COMPARISON:  10/30/2021 FINDINGS: Transverse diameter of heart is increased. There are no signs of alveolar pulmonary edema. There is poor inspiration. Increased markings are seen in both lower lung fields. There is no significant pleural effusion or pneumothorax. IMPRESSION: Increased markings are seen in both lower lung fields suggesting atelectasis/pneumonia. Part of this finding may be due to poor inspiration. Electronically Signed   By: Ernie Avena M.D.   On: 10/31/2021 10:04    Antimicrobials:  none    Subjective: Patient is resting in bed comfortably today Answering questions appropriately, no decompensation  Objective: Vitals:   11/04/21 1617 11/04/21 1900 11/04/21 2129 11/05/21 0300  BP: 140/84 (!) 186/94 118/64 (!) 157/96  Pulse: 72 82 69 72  Resp: Temp: 97.7 F (36.5 C) 97.7 F (36.5 C) 97.7 F (36.5 C) 98 F (36.7 C)  TempSrc:  Oral Oral Oral  SpO2: 98% 97% 97% 95%  Weight:      Height:       No intake or output data in the 24 hours ending 11/05/21 1239 Filed Weights   10/31/21 0357 11/01/21 0500 11/03/21 0500  Weight: 102.6 kg 102 kg 108.1 kg    Examination:  General exam: Appears calm and comfortable  Respiratory system: Clear to auscultation. Respiratory effort normal. Cardiovascular system: S1 & S2 heard, RRR. No JVD, murmurs, rubs, gallops or clicks. No pedal edema. Gastrointestinal system: Abdomen is nondistended, soft and nontender. No organomegaly or masses felt. Normal bowel sounds heard. Central nervous system: Alert and oriented. No focal neurological deficits. Extremities: Warm well perfused, no edema Skin: No rashes, lesions or ulcers Psychiatry:   appropriate,, no acute decomp    Data Reviewed: I have personally reviewed following labs and imaging studies  CBC: Recent Labs  Lab 10/30/21 2008 10/31/21 0223 10/31/21 0854  WBC 12.8* 8.7 8.6  NEUTROABS 5.8  --  4.1  HGB 14.7 13.3 12.8*  HCT 43.4 38.8* 38.2*  MCV 67.8* 67.1* 66.8*  PLT 204 172 100*   Basic Metabolic Panel: Recent Labs  Lab 10/30/21 2008 10/30/21 2225 10/31/21 0223 10/31/21 0726 11/01/21 0247 11/02/21 0506 11/03/21 0212 11/04/21 0523 11/05/21 0113  NA 117*   < > 117*   < > 131* 130* 129* 129* 128*  K 4.4  --  3.6   < > 4.1 4.2 4.1 4.1 4.3  CL 82*  --  85*   < > 99 96* 93* 96* 93*  CO2 12*  --  21*   < > GLUCOSE 114*  --  102*   < > 90 90 99 93 103*  BUN 8  --  10   < > CREATININE 0.89  --  1.23   < > 0.61 0.66 0.72 0.59* 0.61  CALCIUM 9.2  --  8.6*   < > 8.4* 8.6* 8.8* 8.9 8.9  MG 1.7  --  1.8  --   --   --   --   --   --   PHOS 5.2*  --   --   --   --   --   --   --   --    < > = values in this interval not displayed.   GFR: Estimated Creatinine Clearance: 127.3 mL/min (by C-G formula based on SCr of 0.61 mg/dL). Liver Function Tests: Recent Labs  Lab 10/30/21 2008 10/31/21 0223  AST 84* 65*  ALT 18 16  ALKPHOS 80 59  BILITOT 1.4* 1.1  PROT 7.5 6.5  ALBUMIN 4.2 3.8   No results for input(s): LIPASE, AMYLASE in the last 168 hours. No results for input(s): AMMONIA in the last 168 hours. Coagulation Profile: No results for input(s): INR, PROTIME in the last 168 hours. Cardiac Enzymes: Recent Labs  Lab 11/01/21 1521 11/02/21 0506 11/03/21 0212 11/04/21 0523 11/05/21 0113  CKTOTAL 2,298* 1,926* 1,616* 970* 664*   BNP (last 3 results) No results for input(s): PROBNP in the last 8760 hours. HbA1C: No results for input(s): HGBA1C in the last 72 hours. CBG: Recent Labs  Lab 10/31/21 2002 11/01/21 0002 11/01/21 0040 11/01/21 0353 11/01/21 0718  GLUCAP 99 116* 139* 84 95   Lipid Profile: No  results for input(s): CHOL, HDL, LDLCALC, TRIG, CHOLHDL, LDLDIRECT in the last 72 hours. Thyroid Function Tests: No results for input(s): TSH, T4TOTAL, FREET4, T3FREE,  THYROIDAB in the last 72 hours. Anemia Panel: No results for input(s): VITAMINB12, FOLATE, FERRITIN, TIBC, IRON, RETICCTPCT in the last 72 hours. Sepsis Labs: Recent Labs  Lab 10/30/21 2225 10/31/21 0726 10/31/21 1619 10/31/21 2316  LATICACIDVEN 1.5 1.0 1.7 1.6    Recent Results (from the past 240 hour(s))  Resp Panel by RT-PCR (Flu A&B, Covid) Nasopharyngeal Swab     Status: None   Collection Time: 10/31/21  8:36 AM   Specimen: Nasopharyngeal Swab; Nasopharyngeal(NP) swabs in vial transport medium  Result Value Ref Range Status   SARS Coronavirus 2 by RT PCR NEGATIVE NEGATIVE Final    Comment: (NOTE) SARS-CoV-2 target nucleic acids are NOT DETECTED.  The SARS-CoV-2 RNA is generally detectable in upper respiratory specimens during the acute phase of infection. The lowest concentration of SARS-CoV-2 viral copies this assay can detect is 138 copies/mL. A negative result does not preclude SARS-Cov-2 infection and should not be used as the sole basis for treatment or other patient management decisions. A negative result may occur with  improper specimen collection/handling, submission of specimen other than nasopharyngeal swab, presence of viral mutation(s) within the areas targeted by this assay, and inadequate number of viral copies(<138 copies/mL). A negative result must be combined with clinical observations, patient history, and epidemiological information. The expected result is Negative.  Fact Sheet for Patients:  BloggerCourse.com  Fact Sheet for Healthcare Providers:  SeriousBroker.it  This test is no t yet approved or cleared by the [place] FDA and  has been authorized for detection and/or diagnosis of SARS-CoV-2 by FDA under an Emergency Use  Authorization (EUA). This EUA will remain  in effect (meaning this test can be used) for the duration of the COVID-19 declaration under Section 564(b)(1) of the Act, 21 U.S.C.section 360bbb-3(b)(1), unless the authorization is terminated  or revoked sooner.       Influenza A by PCR NEGATIVE NEGATIVE Final   Influenza B by PCR NEGATIVE NEGATIVE Final    Comment: (NOTE) The Xpert Xpress SARS-CoV-2/FLU/RSV plus assay is intended as an aid in the diagnosis of influenza from Nasopharyngeal swab specimens and should not be used as a sole basis for treatment. Nasal washings and aspirates are unacceptable for Xpert Xpress SARS-CoV-2/FLU/RSV testing.  Fact Sheet for Patients: BloggerCourse.com  Fact Sheet for Healthcare Providers: SeriousBroker.it  This test is not yet approved or cleared by the [place] FDA and has been authorized for detection and/or diagnosis of SARS-CoV-2 by FDA under an Emergency Use Authorization (EUA). This EUA will remain in effect (meaning this test can be used) for the duration of the COVID-19 declaration under Section 564(b)(1) of the Act, 21 U.S.C. section 360bbb-3(b)(1), unless the authorization is terminated or revoked.  Performed at [facility], [address] [place], [place] [address]   Culture, blood (routine x 2)     Status: None   Collection Time: 10/31/21  8:54 AM   Specimen: Left Antecubital; Blood  Result Value Ref Range Status   Specimen Description LEFT ANTECUBITAL  Final   Special Requests   Final    BOTTLES DRAWN AEROBIC AND ANAEROBIC Blood Culture results may not be optimal due to an inadequate volume of blood received in culture bottles   Culture   Final    NO GROWTH 5 DAYS Performed at [facility], [address], [place], [place] [address]    Report Status 11/05/2021 FINAL  Final  Culture, blood (routine x 2)     Status: None  Collection Time: 10/31/21  9:03 AM    Specimen: BLOOD RIGHT ARM  Result Value Ref Range Status   Specimen Description BLOOD RIGHT ARM  Final   Special Requests   Final    BOTTLES DRAWN AEROBIC AND ANAEROBIC Blood Culture results may not be optimal due to an inadequate volume of blood received in culture bottles   Culture   Final    NO GROWTH 5 DAYS Performed at [facility], [address] [place], [place] [address]    Report Status 11/05/2021 FINAL  Final  Urine Culture     Status: Abnormal   Collection Time: 10/31/21  1:46 PM   Specimen: In/Out Cath Urine  Result Value Ref Range Status   Specimen Description IN/OUT CATH URINE  Final   Special Requests   Final    NONE Performed at [facility], [address] [place], [place] [address]    Culture MULTIPLE SPECIES PRESENT, SUGGEST RECOLLECTION (A)  Final   Report Status 11/01/2021 FINAL  Final         Radiology Studies: No results found.      Scheduled Meds:  ALPRAZolam  0.25 mg Oral BID   atorvastatin  40 mg Oral Daily   carvedilol  3.125 mg Oral BID WC   digoxin  0.25 mg Oral Daily   divalproex  500 mg Oral BID   mouth rinse  15 mL Mouth Rinse BID   melatonin  3 mg Oral QHS   mirtazapine  15 mg Oral QHS   nicotine  14 mg Transdermal Daily   paliperidone  3 mg Oral Daily   pneumococcal 23 valent vaccine  0.5 mL Intramuscular Tomorrow-1000   pregabalin  25 mg Oral TID   QUEtiapine  50 mg Oral QHS   sacubitril-valsartan  1 tablet Oral BID   senna-docusate  2 tablet Oral BID   sodium chloride  2 g Oral BID WC   Continuous Infusions:   LOS: 37 days    Time spent: 35 min    Burke Keels, MD [facility]  If 7PM-7AM, please contact night-coverage  11/05/2021, 12:39 PM

## 2021-11-06 DIAGNOSIS — K5900 Constipation, unspecified: Secondary | ICD-10-CM | POA: Diagnosis not present

## 2021-11-06 DIAGNOSIS — E871 Hypo-osmolality and hyponatremia: Secondary | ICD-10-CM | POA: Diagnosis not present

## 2021-11-06 DIAGNOSIS — I5022 Chronic systolic (congestive) heart failure: Secondary | ICD-10-CM | POA: Diagnosis not present

## 2021-11-06 DIAGNOSIS — F0390 Unspecified dementia without behavioral disturbance: Secondary | ICD-10-CM | POA: Diagnosis not present

## 2021-11-06 LAB — CK: Total CK: 486 U/L — ABNORMAL HIGH (ref 49–397)

## 2021-11-06 LAB — BASIC METABOLIC PANEL
Anion gap: 7 (ref 5–15)
BUN: 8 mg/dL (ref 6–20)
CO2: 26 mmol/L (ref 22–32)
Calcium: 8.9 mg/dL (ref 8.9–10.3)
Chloride: 93 mmol/L — ABNORMAL LOW (ref 98–111)
Creatinine, Ser: 0.63 mg/dL (ref 0.61–1.24)
GFR, Estimated: >60 mL/min (ref >60)
Glucose, Bld: 90 mg/dL (ref 70–99)
Potassium: 4.3 mmol/L (ref 3.5–5.1)
Sodium: 126 mmol/L — ABNORMAL LOW (ref 135–145)

## 2021-11-06 NOTE — Plan of Care (Signed)
Pt is alert oriented x 4. Ambulatory, pt has sitter due to IVC status. No distress noted. Pt rested well. Denies pain. ? ? ? ?Problem: Education: ?Goal: Knowledge of General Education information will improve ?Description: Including pain rating scale, medication(s)/side effects and non-pharmacologic comfort measures ?Outcome: Progressing ?  ?Problem: Health Behavior/Discharge Planning: ?Goal: Ability to manage health-related needs will improve ?Outcome: Progressing ?  ?Problem: Clinical Measurements: ?Goal: Ability to maintain clinical measurements within normal limits will improve ?Outcome: Progressing ?  ?Problem: Education: ?Goal: Knowledge of General Education information will improve ?Description: Including pain rating scale, medication(s)/side effects and non-pharmacologic comfort measures ?Outcome: Progressing ?  ?Problem: Health Behavior/Discharge Planning: ?Goal: Ability to manage health-related needs will improve ?Outcome: Progressing ?  ?Problem: Clinical Measurements: ?Goal: Ability to maintain clinical measurements within normal limits will improve ?Outcome: Progressing ?Goal: Will remain free from infection ?Outcome: Progressing ?Goal: Diagnostic test results will improve ?Outcome: Progressing ?Goal: Respiratory complications will improve ?Outcome: Progressing ?Goal: Cardiovascular complication will be avoided ?Outcome: Progressing ?  ?Problem: Activity: ?Goal: Risk for activity intolerance will decrease ?Outcome: Progressing ?  ?Problem: Nutrition: ?Goal: Adequate nutrition will be maintained ?Outcome: Progressing ?  ?Problem: Coping: ?Goal: Level of anxiety will decrease ?Outcome: Progressing ?  ?Problem: Elimination: ?Goal: Will not experience complications related to bowel motility ?Outcome: Progressing ?Goal: Will not experience complications related to urinary retention ?Outcome: Progressing ?  ?Problem: Pain Managment: ?Goal: General experience of comfort will improve ?Outcome: Progressing ?   ?Problem: Safety: ?Goal: Ability to remain free from injury will improve ?Outcome: Progressing ?  ?Problem: Skin Integrity: ?Goal: Risk for impaired skin integrity will decrease ?Outcome: Progressing ?  ?

## 2021-11-06 NOTE — Progress Notes (Signed)
PROGRESS NOTE    Jim Dunn  [id number] DOB: 12-11-1965 DOA: 09/24/2021 PCP: Merryl Hacker, No   Brief Narrative:  Jim Dunn is a 56 y.o. male with PMH significant for schizophrenia, chronic hyponatremia, seizure disorder, tobacco use, systolic congestive heart failure, COPD who was brought to ED on 09/24/2021 by his family after they were unable to take care of him.  He was drinking a lot of water and trying to burn things. Previously he was staying at group home and was kicked out.   Neurology and psychiatry were consulted.   Hospital course remarkable for acute on chronic hyponatremia, breakthrough seizures. Prolonged hospitalization.   TOC following.  APS in process to determine if they will pursue guardianship     Patient's behaviors have been stable.  Process was put in place to allow patient to briefly leave the floor given long length of stay.  Criteria were as follows: Must notify the nurse when he was leaving the floor, would not be allowed to leave the floor for longer than 30 minutes and he was not allowed to leave hospital grounds.  If any 1 of these criteria were broken off unit privileges would be revoked.  It is noted that eventual discharge plan would be to a group home although 24/7 monitoring of fluid intake would be difficult in that setting.   On the evening of 2/26 patient developed a grand mal seizure and it was noted his sodium had decreased significantly to 117.  It was felt that patient had been drinking fluids in his room and off the unit leading to hypervolemic hyponatremia.  He has a history of polydipsia and has had seizures related to this in the past.  He was placed on hypertonic saline and transferred to the ICU service.  Of note patient had been on Lasix, total fluid restriction versus only free water restriction and salt tablets.  As of now Lasix has been discontinued.  Fluid restriction pertains to only free water and prior to ICU team assuming care orders changed to  allow pt to consume Gatorade and other electrolyte-based fluids to help with excessive thirst.   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  Sodium trend: 125> 129> 133 > 117> 116> 118> 117> 119> 125> 127> 130 > 134 > 133 >> 130 > 129 > 129 > 128>126 As noted previously, Required 3% saline for correction **2/26 patient had seizure activity in context of hyponatremia Continue to hold Lasix.  Previously patient was placed on the as needed Lasix schedule but this may not be an option out of group home Continue fluid restriction but of free water only and sodium tablets will provide Gatorade in the room to help with thirst and Gatorade will not be included in the fluid restriction volumes. Continue to follow CK.  Likely related to recent grand mal seizure.  As of 3/5 is down to 486 Of  note patient reports less desire to drink as many fluids since the benzodiazepines have been started.  It is important to note that during his previous Long length of stay hospitalization when I cared for him I had placed him on benzodiazepines and wean did not have any issues with psychogenic polydipsia during the hospitalization.     Seizure disorder ([place]) Continue Depakote-valproic acid level 41 previous level on 2/10 was 0.2 History of nonadherence and  valproic acid on 1/22 was 19 EEG negative for acute seizure activity Continue seizure precautions Depakote can also function as a mood stabilizer MRI revealed arachnoid cyst that is chronic in nature S/p  grand mal seizure with postictal phase on the evening of 2/26 presumed to be precipitated/provoked by abrupt hyponatremia with sodium less than 120.  Valproic acid level 58   Elevated CK Secondary to recent seizure activity as well as volume depletion Encourage oral intake of electrolyte based fluids CK trend is downward.   Paranoid schizophrenia with hallucinations Appreciate psychiatric team help Schizophrenia symptoms and decompensation worsened by recurrent hyponatremia Also has a component of anxiety and during previous admission requested antianxiety medications Continue Seroquel and Invega Discontinuing Vistaril in favor of Xanax which has previously been well effective for this patient   Chronic systolic CHF (congestive heart failure) (HCC) Continue digoxin and Entresto  Lasix discontinued during recent episode of acute hyponatremia with seizure activity as well as hypotension we will continue to hold until CK normalized Digoxin level subtherapeutic as of 2/10 but this also appears to be a chronic issue.  Follow periodically Strict I/O and daily weights Fluid restriction for chronic hyponatremia for free water primarily-allowing gatorade and other electrolyte based fluids to not be included in FR Last echo: 5/21 with EF of 25-30% with global hypokinesis, RV overload and indeterminate diastolic dysfunction. Does not appear that he has followed up with cardiology. Gets meds from ED.  Echocardiogram performed on 09/26/2021 EF of 30-35% with moderately decreased systolic function and global hypokinesis. RV size and systolic function is normal. Weight measurements have been inconsistent and appear to be inaccurate   Dementia without behavioral disturbance ([place]) During initial  admission over 1 year ago had formal neuropsychiatric evaluation by Dr. Sima Matas who diagnosed patient with dementia likely related to years of schizophrenia and nonadherence to medication regimen SLUMS evaluation worsened since previous admission noting has declined from 87 to 38.  He lacks capacity to make decisions APS investigating guardianship noting no other family members or friends are willing to pursue guardianship Had previously initiated Vistaril for patient's anxiety issues.  Not effective.  During previous hospitalization I used Xanax 0.25 mg BID and this was more effective with the patient therefore I have transition to Xanax as of 3/1    Peripheral neuropathy Chronic issue and was present during previous admission Likely related to years of substance abuse Continue low-dose Lyrica since was effective previous   Mixed hyperlipidemia Continue lipitor Apparently has been nonadherent with medicines prior to admission   COPD (chronic obstructive pulmonary disease) ([place]) Compensated Given Pneumovax immunization this admission   Constipation-resolved as of 11/02/2021 No bowel movement for several days Nursing states patient has been refusing preventative laxatives-as of 2/23 patient is agreeing to take stool softeners and laxative Give one-time dose of milk of magnesia to promote bowel movement    DVT prophylaxis: SCD/Compression stockings  Code Status: Full    Code Status Orders  (From admission, onward)           Start  Ordered   09/25/21 1753  Full code  Continuous        09/25/21 1755           Code Status History     Date Active Date Inactive Code Status Order ID Comments User Context   09/11/2021 1530 09/12/2021 2233 Full Code [id number]  Estill Cotta ED   09/04/2021 1541 09/05/2021 2059 Full Code [id number]  Dorie Rank, MD ED   07/30/2020 0830 07/31/2020 0805 Full Code [id number]  Varney Biles, MD ED   07/21/2020 0453 07/21/2020 1702 Full Code  [id number]  Delora Fuel, MD ED   02/23/2020 2301 06/08/2020 1642 Full Code [id number]  Vernelle Emerald, MD ED   02/19/2020 2344 02/21/2020 1252 Full Code [id number]  Vianne Bulls, MD ED   01/22/2020 0758 01/28/2020 0134 Full Code [id number]  Norval Morton, MD ED      Family Communication: patient today  Disposition Plan:   placement pending stabilization of na Consults called: None Admission status: Inpatient   Consultants:  Psych, neuro  Procedures:  DG Chest 1 View  Result Date: 10/30/2021 CLINICAL DATA:  Altered mental status.  Pneumonia. EXAM: CHEST  1 VIEW COMPARISON:  Chest radiograph dated 07/26/2021. FINDINGS: No focal consolidation, pleural effusion or pneumothorax. The cardiac silhouette is within limits. No acute osseous pathology. IMPRESSION: No active cardiopulmonary disease. Electronically Signed   By: Anner Crete M.D.   On: 10/30/2021 23:15   CT HEAD WO CONTRAST (5MM)  Result Date: 10/31/2021 CLINICAL DATA:  Sudden severe headache EXAM: CT HEAD WITHOUT CONTRAST TECHNIQUE: Contiguous axial images were obtained from the base of the skull through the vertex without intravenous contrast. RADIATION DOSE REDUCTION: This exam was performed according to the departmental dose-optimization program which includes automated exposure control, adjustment of the mA and/or kV according to patient size and/or use of iterative reconstruction technique. COMPARISON:  02/23/2020 FINDINGS: Brain: There is no mass, hemorrhage or extra-axial collection. Unchanged large arachnoid cyst of the posterior right hemisphere. Vascular: No abnormal hyperdensity of the major intracranial arteries or dural venous sinuses. No intracranial atherosclerosis. Skull: The visualized skull base, calvarium and extracranial soft tissues are normal. Sinuses/Orbits: No fluid levels or advanced mucosal thickening of the visualized paranasal sinuses. No mastoid or middle ear effusion. The orbits are normal. IMPRESSION: 1.  No acute intracranial abnormality. 2. Unchanged large arachnoid cyst of the posterior right hemisphere. Electronically Signed   By: Ulyses Jarred M.D.   On: 10/31/2021 03:56   DG CHEST PORT 1 VIEW  Result Date: 10/31/2021 CLINICAL DATA:  Aspiration pneumonia EXAM: PORTABLE CHEST 1 VIEW COMPARISON:  10/30/2021 FINDINGS: Transverse diameter of heart is increased. There are no signs of alveolar pulmonary edema. There is poor inspiration. Increased markings are seen in both lower lung fields. There is no significant pleural effusion or pneumothorax. IMPRESSION: Increased markings are seen in both lower lung fields suggesting atelectasis/pneumonia. Part of this finding may be due to poor inspiration. Electronically Signed   By: Elmer Picker M.D.   On: 10/31/2021 10:04    Antimicrobials:  none    Subjective: Pt is resting in bed comoftably No complaints Pleasant and appropriate  Objective: Vitals:   11/05/21 1910 11/05/21 2344 11/06/21 0348 11/06/21 0942  BP: (!) 158/81 (!) 141/84 134/87 138/84  Pulse: 72 78 80   Resp: 17 18 17    Temp: 98 F (36.7 C) 98.4 F (36.9 C) (!) 97.5 F (36.4 C) 98.1 F (36.7 C)  TempSrc: Oral Oral Oral  Oral  SpO2: 99% 99% 98% 97%  Weight:      Height:        Intake/Output Summary (Last 24 hours) at 11/06/2021 1154 Last data filed at 11/06/2021 0943 Gross per 24 hour  Intake 1320 ml  Output --  Net 1320 ml   Filed Weights   10/31/21 0357 11/01/21 0500 11/03/21 0500  Weight: 102.6 kg 102 kg 108.1 kg    Examination:  General exam: Appears calm and comfortable  Respiratory system: Clear to auscultation. Respiratory effort normal. Cardiovascular system: S1 & S2 heard, RRR. No JVD, murmurs, rubs, gallops or clicks. No pedal edema. Gastrointestinal system: Abdomen is nondistended, soft and nontender. No organomegaly or masses felt. Normal bowel sounds heard. Central nervous system: Alert and oriented. No focal neurological deficits. Extremities:  Warm well perfused, no edema Skin: No rashes, lesions or ulcers Psychiatry:  appropriate,, no acute decomp     Data Reviewed: I have personally reviewed following labs and imaging studies  CBC: Recent Labs  Lab 10/30/21 2008 10/31/21 0223 10/31/21 0854  WBC 12.8* 8.7 8.6  NEUTROABS 5.8  --  4.1  HGB 14.7 13.3 12.8*  HCT 43.4 38.8* 38.2*  MCV 67.8* 67.1* 66.8*  PLT 204 172 [id number]*   Basic Metabolic Panel: Recent Labs  Lab 10/30/21 2008 10/30/21 2225 10/31/21 0223 10/31/21 0726 11/02/21 0506 11/03/21 0212 11/04/21 0523 11/05/21 0113 11/06/21 0346  NA 117*   < > 117*   < > 130* 129* 129* 128* 126*  K 4.4  --  3.6   < > 4.2 4.1 4.1 4.3 4.3  CL 82*  --  85*   < > 96* 93* 96* 93* 93*  CO2 12*  --  21*   < > 26 28 25 26 26   GLUCOSE 114*  --  102*   < > 90 99 93 103* 90  BUN 8  --  10   < > 8 8 7 13 8   CREATININE 0.89  --  1.23   < > 0.66 0.72 0.59* 0.61 0.63  CALCIUM 9.2  --  8.6*   < > 8.6* 8.8* 8.9 8.9 8.9  MG 1.7  --  1.8  --   --   --   --   --   --   PHOS 5.2*  --   --   --   --   --   --   --   --    < > = values in this interval not displayed.   GFR: Estimated Creatinine Clearance: 127.3 mL/min (by C-G formula based on SCr of 0.63 mg/dL). Liver Function Tests: Recent Labs  Lab 10/30/21 2008 10/31/21 0223  AST 84* 65*  ALT 18 16  ALKPHOS 80 59  BILITOT 1.4* 1.1  PROT 7.5 6.5  ALBUMIN 4.2 3.8   No results for input(s): LIPASE, AMYLASE in the last 168 hours. No results for input(s): AMMONIA in the last 168 hours. Coagulation Profile: No results for input(s): INR, PROTIME in the last 168 hours. Cardiac Enzymes: Recent Labs  Lab 11/02/21 0506 11/03/21 0212 11/04/21 0523 11/05/21 0113 11/06/21 0346  CKTOTAL 1,926* 1,616* 970* 664* 486*   BNP (last 3 results) No results for input(s): PROBNP in the last 8760 hours. HbA1C: No results for input(s): HGBA1C in the last 72 hours. CBG: Recent Labs  Lab 10/31/21 2002 11/01/21 0002 11/01/21 0040  11/01/21 0353 11/01/21 0718  GLUCAP 99 116* 139* 84 95   Lipid Profile: No results for  input(s): CHOL, HDL, LDLCALC, TRIG, CHOLHDL, LDLDIRECT in the last 72 hours. Thyroid Function Tests: No results for input(s): TSH, T4TOTAL, FREET4, T3FREE, THYROIDAB in the last 72 hours. Anemia Panel: No results for input(s): VITAMINB12, FOLATE, FERRITIN, TIBC, IRON, RETICCTPCT in the last 72 hours. Sepsis Labs: Recent Labs  Lab 10/30/21 2225 10/31/21 0726 10/31/21 1619 10/31/21 2316  LATICACIDVEN 1.5 1.0 1.7 1.6    Recent Results (from the past 240 hour(s))  Resp Panel by RT-PCR (Flu A&B, Covid) Nasopharyngeal Swab     Status: None   Collection Time: 10/31/21  8:36 AM   Specimen: Nasopharyngeal Swab; Nasopharyngeal(NP) swabs in vial transport medium  Result Value Ref Range Status   SARS Coronavirus 2 by RT PCR NEGATIVE NEGATIVE Final    Comment: (NOTE) SARS-CoV-2 target nucleic acids are NOT DETECTED.  The SARS-CoV-2 RNA is generally detectable in upper respiratory specimens during the acute phase of infection. The lowest concentration of SARS-CoV-2 viral copies this assay can detect is 138 copies/mL. A negative result does not preclude SARS-Cov-2 infection and should not be used as the sole basis for treatment or other patient management decisions. A negative result may occur with  improper specimen collection/handling, submission of specimen other than nasopharyngeal swab, presence of viral mutation(s) within the areas targeted by this assay, and inadequate number of viral copies(<138 copies/mL). A negative result must be combined with clinical observations, patient history, and epidemiological information. The expected result is Negative.  Fact Sheet for Patients:  EntrepreneurPulse.com.au  Fact Sheet for Healthcare Providers:  IncredibleEmployment.be  This test is no t yet approved or cleared by the [place] FDA and  has been authorized  for detection and/or diagnosis of SARS-CoV-2 by FDA under an Emergency Use Authorization (EUA). This EUA will remain  in effect (meaning this test can be used) for the duration of the COVID-19 declaration under Section 564(b)(1) of the Act, 21 U.S.C.section 360bbb-3(b)(1), unless the authorization is terminated  or revoked sooner.       Influenza A by PCR NEGATIVE NEGATIVE Final   Influenza B by PCR NEGATIVE NEGATIVE Final    Comment: (NOTE) The Xpert Xpress SARS-CoV-2/FLU/RSV plus assay is intended as an aid in the diagnosis of influenza from Nasopharyngeal swab specimens and should not be used as a sole basis for treatment. Nasal washings and aspirates are unacceptable for Xpert Xpress SARS-CoV-2/FLU/RSV testing.  Fact Sheet for Patients: EntrepreneurPulse.com.au  Fact Sheet for Healthcare Providers: IncredibleEmployment.be  This test is not yet approved or cleared by the [place] FDA and has been authorized for detection and/or diagnosis of SARS-CoV-2 by FDA under an Emergency Use Authorization (EUA). This EUA will remain in effect (meaning this test can be used) for the duration of the COVID-19 declaration under Section 564(b)(1) of the Act, 21 U.S.C. section 360bbb-3(b)(1), unless the authorization is terminated or revoked.  Performed at [facility], [place] [address] [place], [place] [address]   Culture, blood (routine x 2)     Status: None   Collection Time: 10/31/21  8:54 AM   Specimen: Left Antecubital; Blood  Result Value Ref Range Status   Specimen Description LEFT ANTECUBITAL  Final   Special Requests   Final    BOTTLES DRAWN AEROBIC AND ANAEROBIC Blood Culture results may not be optimal due to an inadequate volume of blood received in culture bottles   Culture   Final    NO GROWTH 5 DAYS Performed at [facility], [place] [address], [place], [place]  [id number]    Report Status 11/05/2021 FINAL  Final  Culture,  blood (routine x 2)     Status: None   Collection Time: 10/31/21  9:03 AM   Specimen: BLOOD RIGHT ARM  Result Value Ref Range Status   Specimen Description BLOOD RIGHT ARM  Final   Special Requests   Final    BOTTLES DRAWN AEROBIC AND ANAEROBIC Blood Culture results may not be optimal due to an inadequate volume of blood received in culture bottles   Culture   Final    NO GROWTH 5 DAYS Performed at [facility], [place] [address] [place], [place] [address]    Report Status 11/05/2021 FINAL  Final  Urine Culture     Status: Abnormal   Collection Time: 10/31/21  1:46 PM   Specimen: In/Out Cath Urine  Result Value Ref Range Status   Specimen Description IN/OUT CATH URINE  Final   Special Requests   Final    NONE Performed at [facility], [place] [address], [place], [place] [address]    Culture MULTIPLE SPECIES PRESENT, SUGGEST RECOLLECTION (A)  Final   Report Status 11/01/2021 FINAL  Final         Radiology Studies: No results found.      Scheduled Meds:  ALPRAZolam  0.25 mg Oral BID   atorvastatin  40 mg Oral Daily   carvedilol  3.125 mg Oral BID WC   digoxin  0.25 mg Oral Daily   divalproex  500 mg Oral BID   mouth rinse  15 mL Mouth Rinse BID   melatonin  3 mg Oral QHS   mirtazapine  15 mg Oral QHS   nicotine  14 mg Transdermal Daily   paliperidone  3 mg Oral Daily   pneumococcal 23 valent vaccine  0.5 mL Intramuscular Tomorrow-1000   pregabalin  25 mg Oral TID   QUEtiapine  50 mg Oral QHS   sacubitril-valsartan  1 tablet Oral BID   senna-docusate  2 tablet Oral BID   sodium chloride  2 g Oral BID WC   Continuous Infusions:   LOS: 38 days    Time spent: 35 min    Nicolette Bang, MD [facility]  If 7PM-7AM, please contact night-coverage  11/06/2021, 11:54 AM

## 2021-11-07 DIAGNOSIS — E871 Hypo-osmolality and hyponatremia: Secondary | ICD-10-CM | POA: Diagnosis not present

## 2021-11-07 LAB — BASIC METABOLIC PANEL
Anion gap: 9 (ref 5–15)
BUN: 11 mg/dL (ref 6–20)
CO2: 24 mmol/L (ref 22–32)
Calcium: 8.5 mg/dL — ABNORMAL LOW (ref 8.9–10.3)
Chloride: 95 mmol/L — ABNORMAL LOW (ref 98–111)
Creatinine, Ser: 0.59 mg/dL — ABNORMAL LOW (ref 0.61–1.24)
GFR, Estimated: >60 mL/min (ref >60)
Glucose, Bld: 143 mg/dL — ABNORMAL HIGH (ref 70–99)
Potassium: 3.6 mmol/L (ref 3.5–5.1)
Sodium: 128 mmol/L — ABNORMAL LOW (ref 135–145)

## 2021-11-07 LAB — CK: Total CK: 347 U/L (ref 49–397)

## 2021-11-07 NOTE — Progress Notes (Signed)
Progress Note   Patient: Jim Dunn HFW:[id number] DOB: 06/14/1966 DOA: 09/24/2021     39 DOS: the patient was seen and examined on 11/07/2021   Brief hospital course: Ascencion Shimanek is a 56 y.o. male with PMH significant for schizophrenia, chronic hyponatremia, seizure disorder, tobacco use, systolic congestive heart failure, COPD who was brought to ED on 09/24/2021 by his family after they were unable to take care of him.  He was drinking a lot of water and trying to burn things. Previously he was staying at group home and was kicked out.   Neurology and psychiatry were consulted.   Hospital course remarkable for acute on chronic hyponatremia, breakthrough seizures. Prolonged hospitalization.   TOC following.  APS in process to determine if they will pursue guardianship    Patient's behaviors have been stable.  Process was put in place to allow patient to briefly leave the floor given long length of stay.  Criteria were as follows: Must notify the nurse when he was leaving the floor, would not be allowed to leave the floor for longer than 30 minutes and he was not allowed to leave hospital grounds.  If any 1 of these criteria were broken off unit privileges would be revoked.  It is noted that eventual discharge plan would be to a group home although 24/7 monitoring of fluid intake would be difficult in that setting.  On the evening of 2/26 patient developed a grand mal seizure and it was noted his sodium had decreased significantly to 117.  It was felt that patient had been drinking fluids in his room and off the unit leading to hypervolemic hyponatremia.  He has a history of polydipsia and has had seizures related to this in the past.  He was placed on hypertonic saline and transferred to the ICU service.  Of note patient had been on Lasix, total fluid restriction versus only free water restriction and salt tablets.  As of now Lasix has been discontinued.  Fluid restriction pertains to only free water  and prior to ICU team assuming care orders changed to allow pt to consume Gatorade and other electrolyte-based fluids to help with excessive thirst.    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  2/26 had hyponatremic related seizure with nadir all sodium 116.  Required 3% saline for correction and ICU stay.  Current sodium ranging between 126 and 129. Past 24-hour intake of fluids including electrolyte-based fluids is 1680 cc Continue to hold Lasix.  Restriction will focus on free water only (including coffee).  Patient is allowed electrolyte-based fluids such as Gatorade as well as juices and milk that do not count towards the fluid restriction Patient had initial CK after seizure greater than 3000 which has now normalized    Seizure disorder (HCC) Continue Depakote-valproic acid level 41 previous level on 2/10 was 0.2 History of nonadherence and valproic acid on 1/22 was 19 EEG negative for acute seizure activity Continue seizure precautions Depakote can also function as a mood stabilizer MRI revealed arachnoid cyst that is chronic in nature S/p  grand mal seizure with postictal phase on the evening of 2/26 presumed to be precipitated/provoked by abrupt hyponatremia with sodium less than 120.  Valproic acid level 58  Elevated CK Secondary to recent seizure activity as well as  volume depletion Encourage oral intake of electrolyte based fluids CK trend is downward.  2298 > 1926 > 1616 > 970 and now is less than 400  Paranoid schizophrenia with hallucinations Appreciate psychiatric team help Schizophrenia symptoms and decompensation worsened by recurrent hyponatremia Also has a component of anxiety and during previous admission requested  antianxiety medications Continue Seroquel and Invega Discontinuing Vistaril in favor of Xanax which has previously been well effective for this patient  Chronic systolic CHF (congestive heart failure) (HCC) Continue digoxin and Entresto  Lasix discontinued during recent episode of acute hyponatremia with seizure activity as well as hypotension we will continue to hold until CK normalized Digoxin level subtherapeutic as of 2/10 but this also appears to be a chronic issue.  Follow periodically Strict I/O and daily weights Fluid restriction for chronic hyponatremia for free water primarily-allowing gatorade and other electrolyte based fluids to not be included in FR Last echo: 5/21 with EF of 25-30% with global hypokinesis, RV overload and indeterminate diastolic dysfunction. Does not appear that he has followed up with cardiology. Gets meds from ED.  Echocardiogram performed on 09/26/2021 EF of 30-35% with moderately decreased systolic function and global hypokinesis. RV size and systolic function is normal. Weight measurements have been inconsistent and appear to be inaccurate  Dementia without behavioral disturbance (HCC) During initial admission over 1 year ago had formal neuropsychiatric evaluation by Dr. Kieth Brightly who diagnosed patient with dementia likely related to years of schizophrenia and nonadherence to medication regimen SLUMS evaluation worsened since previous admission noting has declined from 56 to 69.  He lacks capacity to make decisions APS investigating guardianship noting no other family members or friends are willing to pursue guardianship Had previously initiated Vistaril for patient's anxiety issues.  Not effective.  During previous hospitalization I used Xanax 0.25 mg BID and this was more effective with the patient therefore I have transition to Xanax as of 3/1     Peripheral neuropathy Chronic issue and was present during previous admission Likely related to years of  substance abuse Continue low-dose Lyrica since was effective previous  A physical therapy consult is indicated based on the patients mobility assessment.   Mobility Assessment (last 72 hours)     Mobility Assessment     Row Name 11/01/21 0730 10/31/21 0730 10/30/21 1128 10/29/21 2100 10/29/21 0808   Does patient have an order for bedrest or is patient medically unstable No - Continue assessment No - Continue assessment No - Continue assessment No - Continue assessment No - Continue assessment   What is the highest level of mobility based on the progressive mobility assessment? Level 6 (Walks independently in room and hall) - Balance while walking in room without assist - Complete Level 6 (Walks independently in room and hall) - Balance while walking in room without assist - Complete Level 6 (Walks independently in room and hall) - Balance while walking in room without assist - Complete Level 6 (Walks independently in room and hall) - Balance while walking in room without assist - Complete Level 5 (Walks with assist in room/hall) - Balance while stepping forward/back and can walk in room with assist - Complete             Mixed hyperlipidemia Continue lipitor Apparently has been nonadherent with medicines prior to admission  COPD (chronic obstructive pulmonary disease) (HCC) Compensated Given Pneumovax immunization this admission  Constipation-resolved as of 11/02/2021 No bowel movement for several days Nursing states patient has been refusing preventative laxatives-as of  2/23 patient is agreeing to take stool softeners and laxative Give one-time dose of milk of magnesia to promote bowel movement        Subjective:  Patient awake.  Had questions regarding discharge disposition.  Discussed that now that he has a guardian we need to ensure he has disability and Medicaid in place and then another process will need to occur where a care manager will be assigned to him before a  group home can be pursued.  Physical Exam: Vitals:   11/06/21 1947 11/06/21 2334 11/07/21 0341 11/07/21 0835  BP: (!) 148/93 127/78 (!) 104/56 129/72  Pulse: 75 78 66 72  Resp: 18 20 18 20   Temp: 98.2 F (36.8 C) 98.4 F (36.9 C) (!) 97.4 F (36.3 C) 98 F (36.7 C)  TempSrc: Oral Oral Oral Oral  SpO2: 99% 98% 99% 100%  Weight:      Height:       Gen: No acute distress, calm  Pulm: Posterior lung sounds clear to auscultation, on room air, no increased work of breathing Card: S1-S2, normotensive, no peripheral edema Abd: Soft nontender nondistended with normoactive bowel sounds.  Eating well.  LBM 3/4 Neuro: Cranial nerves II through XII grossly intact, moves all extremities x4 without any focal neurological deficits.  Sensation intact.  Mobilizes independently Psych: Alert and oriented x3 but still lacks insight into mental disabilities that prevent patient from managing care safely and independently in the home environment    Data Reviewed:  Results reviewed  Family Communication:  Patient  Disposition: Remains inpatient appropriate because:  Unsafe DC plan  Planned Discharge Destination:  Barriers to discharge: APS to determine if they will pursue guardianship. Family refusing to take pt because stating they cannot manage him noting refuses to take meds  Medically stable Yes  COVID vaccination status:  [employer] 07/20/2020  Consultants: Psychiatry Neurology Procedures: EEG Echocardiogram Antibiotics: None     Time spent: 20 minutes  Author: 07/22/2020, NP 11/07/2021 10:16 AM  For on call review [email or web address].

## 2021-11-07 NOTE — Progress Notes (Signed)
CSW spoke with Jim Dunn of [facility] to coordinate meeting time with [facility] - meeting is scheduled for 12pm on Wednesday 11/09/21. ? ?[name], MSW, LCSW ?Transitions of Care  Clinical Social Worker II ?[phone] ? ?

## 2021-11-08 DIAGNOSIS — E871 Hypo-osmolality and hyponatremia: Secondary | ICD-10-CM | POA: Diagnosis not present

## 2021-11-08 LAB — BASIC METABOLIC PANEL
Anion gap: 8 (ref 5–15)
BUN: 13 mg/dL (ref 6–20)
CO2: 24 mmol/L (ref 22–32)
Calcium: 8.7 mg/dL — ABNORMAL LOW (ref 8.9–10.3)
Chloride: 98 mmol/L (ref 98–111)
Creatinine, Ser: 0.71 mg/dL (ref 0.61–1.24)
GFR, Estimated: >60 mL/min (ref >60)
Glucose, Bld: 89 mg/dL (ref 70–99)
Potassium: 4.2 mmol/L (ref 3.5–5.1)
Sodium: 130 mmol/L — ABNORMAL LOW (ref 135–145)

## 2021-11-08 NOTE — Progress Notes (Signed)
Progress Note   Patient: Jim Dunn URK:[id number] DOB: 02-19-1966 DOA: 09/24/2021     40 DOS: the patient was seen and examined on 11/08/2021   Brief hospital course: Jim Dunn is a 56 y.o. male with PMH significant for schizophrenia, chronic hyponatremia, seizure disorder, tobacco use, systolic congestive heart failure, COPD who was brought to ED on 09/24/2021 by his family after they were unable to take care of him.  He was drinking a lot of water and trying to burn things. Previously he was staying at group home and was kicked out.   Neurology and psychiatry were consulted.   Hospital course remarkable for acute on chronic hyponatremia, breakthrough seizures. Prolonged hospitalization.   TOC following.  APS in process to determine if they will pursue guardianship    Patient's behaviors have been stable.  Process was put in place to allow patient to briefly leave the floor given long length of stay.  Criteria were as follows: Must notify the nurse when he was leaving the floor, would not be allowed to leave the floor for longer than 30 minutes and he was not allowed to leave hospital grounds.  If any 1 of these criteria were broken off unit privileges would be revoked.  It is noted that eventual discharge plan would be to a group home although 24/7 monitoring of fluid intake would be difficult in that setting.  On the evening of 2/26 patient developed a grand mal seizure and it was noted his sodium had decreased significantly to 117.  It was felt that patient had been drinking fluids in his room and off the unit leading to hypervolemic hyponatremia.  He has a history of polydipsia and has had seizures related to this in the past.  He was placed on hypertonic saline and transferred to the ICU service.  Of note patient had been on Lasix, total fluid restriction versus only free water restriction and salt tablets.  As of now Lasix has been discontinued.  Fluid restriction pertains to only free water  and prior to ICU team assuming care orders changed to allow pt to consume Gatorade and other electrolyte-based fluids to help with excessive thirst.  ST IVC in place and can likely be dc'd soon.     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  2/26 had hyponatremic related seizure with nadir all sodium 116.  Required 3% saline for correction and ICU stay.  Current sodium ranging between 126 and 129. Past 24-hour intake of fluids including electrolyte-based fluids is 1680 cc Continue to hold Lasix.  Restriction will focus on free water only (including coffee).  Patient is allowed electrolyte-based fluids such as Gatorade as well as juices and milk that do not count towards the fluid restriction Patient had initial CK after seizure greater than 3000 which has now normalized    Seizure disorder (HCC) Continue Depakote-valproic acid level 41 previous level on 2/10 was 0.2 History of nonadherence and valproic acid on 1/22 was 19 EEG negative for acute seizure activity Continue seizure precautions Depakote can also function as a mood stabilizer MRI revealed arachnoid cyst that is chronic in nature S/p  grand mal seizure with postictal phase on the evening of 2/26 presumed to be precipitated/provoked by abrupt hyponatremia with sodium less than 120.  Valproic acid level  58  Elevated CK Secondary to recent seizure activity as well as volume depletion Encourage oral intake of electrolyte based fluids CK trend is downward.  2298 > 1926 > 1616 > 970 and now is less than 400  Paranoid schizophrenia with hallucinations Appreciate psychiatric team help Schizophrenia symptoms and decompensation worsened by recurrent hyponatremia Also has a component of  anxiety and during previous admission requested antianxiety medications Continue Seroquel and Invega Discontinuing Vistaril in favor of Xanax which has previously been well effective for this patient  Chronic systolic CHF (congestive heart failure) (HCC) Continue digoxin and Entresto  Lasix discontinued during recent episode of acute hyponatremia with seizure activity as well as hypotension we will continue to hold until CK normalized Digoxin level subtherapeutic as of 2/10 but this also appears to be a chronic issue.  Follow periodically Strict I/O and daily weights Fluid restriction for chronic hyponatremia for free water primarily-allowing gatorade and other electrolyte based fluids to not be included in FR Last echo: 5/21 with EF of 25-30% with global hypokinesis, RV overload and indeterminate diastolic dysfunction. Does not appear that he has followed up with cardiology. Gets meds from ED.  Echocardiogram performed on 09/26/2021 EF of 30-35% with moderately decreased systolic function and global hypokinesis. RV size and systolic function is normal. Weight measurements have been inconsistent and appear to be inaccurate  Dementia without behavioral disturbance (HCC) During initial admission over 1 year ago had formal neuropsychiatric evaluation by Dr. Kieth Brightly who diagnosed patient with dementia likely related to years of schizophrenia and nonadherence to medication regimen SLUMS evaluation worsened since previous admission noting has declined from 42 to 29.  He lacks capacity to make decisions APS investigating guardianship noting no other family members or friends are willing to pursue guardianship Had previously initiated Vistaril for patient's anxiety issues.  Not effective.  During previous hospitalization I used Xanax 0.25 mg BID and this was more effective with the patient therefore I have transition to Xanax as of 3/1     Peripheral neuropathy Chronic issue and was present during  previous admission Likely related to years of substance abuse Continue low-dose Lyrica since was effective previous  A physical therapy consult is indicated based on the patients mobility assessment.   Mobility Assessment (last 72 hours)     Mobility Assessment     Row Name 11/01/21 0730 10/31/21 0730 10/30/21 1128 10/29/21 2100 10/29/21 0808   Does patient have an order for bedrest or is patient medically unstable No - Continue assessment No - Continue assessment No - Continue assessment No - Continue assessment No - Continue assessment   What is the highest level of mobility based on the progressive mobility assessment? Level 6 (Walks independently in room and hall) - Balance while walking in room without assist - Complete Level 6 (Walks independently in room and hall) - Balance while walking in room without assist - Complete Level 6 (Walks independently in room and hall) - Balance while walking in room without assist - Complete Level 6 (Walks independently in room and hall) - Balance while walking in room without assist - Complete Level 5 (Walks with assist in room/hall) - Balance while stepping forward/back and can walk in room with assist - Complete             Mixed hyperlipidemia Continue lipitor Apparently has been nonadherent with medicines prior to admission  COPD (chronic obstructive pulmonary disease) (HCC) Compensated Given Pneumovax immunization this admission  Constipation-resolved as of 11/02/2021 No bowel movement  for several days Nursing states patient has been refusing preventative laxatives-as of 2/23 patient is agreeing to take stool softeners and laxative Give one-time dose of milk of magnesia to promote bowel movement        Subjective:  Awake and alert.  Recently went off the unit with the unit director to take a walk.  He is asking me if he can smoke a cigarette.  I informed him that it is against [facility] policy to allow tobacco usage on the  premises.  Physical Exam: Vitals:   11/07/21 1916 11/08/21 0030 11/08/21 0400 11/08/21 0641  BP: (!) 130/57 (!) 153/82 134/86   Pulse: 85 71 63   Resp: 18 18 17    Temp: 98.5 F (36.9 C) 98.5 F (36.9 C) 98.3 F (36.8 C)   TempSrc: Oral Oral Oral   SpO2: 99% 96% 99%   Weight:    103.5 kg  Height:       Gen: No acute distress, calm  Pulm: Posterior lung sounds clear to auscultation, on room air, no increased work of breathing Card: S1-S2, normotensive, no peripheral edema Abd: Soft nontender nondistended with normoactive bowel sounds.  Eating well.  LBM 3/4 Neuro: Cranial nerves II through XII grossly intact, moves all extremities x4 without any focal neurological deficits.  Sensation intact.  Mobilizes independently Psych: Alert and oriented x3 but still lacks insight into mental disabilities that prevent patient from managing care safely and independently in the home environment    Data Reviewed:  Results reviewed  Family Communication:  Patient  Disposition: Remains inpatient appropriate because:  Unsafe DC plan  Planned Discharge Destination:  Barriers to discharge: APS to determine if they will pursue guardianship. Family refusing to take pt because stating they cannot manage him noting refuses to take meds TOC is working with [facility] care coordination to develop a discharge plan for possible group home placement  Medically stable Yes  COVID vaccination status:  [employer] 07/20/2020  Consultants: Psychiatry Neurology Procedures: EEG Echocardiogram Antibiotics: None     Time spent: 20 minutes  Author: 07/22/2020, NP 11/08/2021 7:57 AM  For on call review [email or web address].

## 2021-11-09 DIAGNOSIS — E871 Hypo-osmolality and hyponatremia: Secondary | ICD-10-CM | POA: Diagnosis not present

## 2021-11-09 NOTE — Progress Notes (Signed)
Progress Note   Patient: Jim Dunn BTC:[id number] DOB: 1966/09/04 DOA: 09/24/2021     41 DOS: the patient was seen and examined on 11/09/2021   Brief hospital course: Donel Osowski is a 56 y.o. male with PMH significant for schizophrenia, chronic hyponatremia, seizure disorder, tobacco use, systolic congestive heart failure, COPD who was brought to ED on 09/24/2021 by his family after they were unable to take care of him.  He was drinking a lot of water and trying to burn things. Previously he was staying at group home and was kicked out.   Neurology and psychiatry were consulted.   Hospital course remarkable for acute on chronic hyponatremia, breakthrough seizures. Prolonged hospitalization.   TOC following.  APS in process to determine if they will pursue guardianship    Patient's behaviors have been stable.  Process was put in place to allow patient to briefly leave the floor given long length of stay.  Criteria were as follows: Must notify the nurse when he was leaving the floor, would not be allowed to leave the floor for longer than 30 minutes and he was not allowed to leave hospital grounds.  If any 1 of these criteria were broken off unit privileges would be revoked.  It is noted that eventual discharge plan would be to a group home although 24/7 monitoring of fluid intake would be difficult in that setting.  On the evening of 2/26 patient developed a grand mal seizure and it was noted his sodium had decreased significantly to 117.  It was felt that patient had been drinking fluids in his room and off the unit leading to hypervolemic hyponatremia.  He has a history of polydipsia and has had seizures related to this in the past.  He was placed on hypertonic saline and transferred to the ICU service.  Of note patient had been on Lasix, total fluid restriction versus only free water restriction and salt tablets.  As of now Lasix has been discontinued.  Fluid restriction pertains to only free water  and prior to ICU team assuming care orders changed to allow pt to consume Gatorade and other electrolyte-based fluids to help with excessive thirst.     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  2/26 had hyponatremic related seizure with nadir all sodium 116.  Required 3% saline for correction and ICU stay.  Last sodium 130 on 3/7.  We will check weekly. Past 24-hour intake of fluids including electrolyte-based fluids is 1680 cc Continue to hold Lasix.  Restriction will focus on free water only (including coffee).  Patient is allowed electrolyte-based fluids such as Gatorade as well as juices and milk that do not count towards the fluid restriction Patient had initial CK after seizure greater than 3000 which has now normalized    Seizure disorder (HCC) Continue Depakote-valproic acid level 41 previous level on 2/10 was 0.2 History of nonadherence and valproic acid on 1/22 was 19 EEG negative for acute seizure activity Continue seizure precautions Depakote can also function as a mood stabilizer MRI revealed arachnoid cyst that is chronic in nature S/p  grand mal seizure with postictal phase on the evening of 2/26 presumed to be precipitated/provoked by abrupt hyponatremia with sodium less than 120.  Valproic acid level 58  Elevated CK Secondary to recent seizure  activity as well as volume depletion Encourage oral intake of electrolyte based fluids CK trend is downward.  2298 > 1926 > 1616 > 970 and now is less than 400  Paranoid schizophrenia with hallucinations Appreciate psychiatric team help Schizophrenia symptoms and decompensation worsened by recurrent hyponatremia Also has a component of anxiety and during previous admission requested  antianxiety medications Continue Seroquel and Invega Discontinuing Vistaril in favor of Xanax which has previously been well effective for this patient Have allowed IVC to expire since no longer needed  Chronic systolic CHF (congestive heart failure) (HCC) Continue digoxin and Entresto  Lasix discontinued during recent episode of acute hyponatremia with seizure activity as well as hypotension we will continue to hold until CK normalized Digoxin level subtherapeutic as of 2/10 but this also appears to be a chronic issue.  Follow periodically Strict I/O and daily weights Fluid restriction for chronic hyponatremia for free water primarily-allowing gatorade and other electrolyte based fluids to not be included in FR Last echo: 5/21 with EF of 25-30% with global hypokinesis, RV overload and indeterminate diastolic dysfunction. Does not appear that he has followed up with cardiology. Gets meds from ED.  Echocardiogram performed on 09/26/2021 EF of 30-35% with moderately decreased systolic function and global hypokinesis. RV size and systolic function is normal. Weight measurements have been inconsistent and appear to be inaccurate  Dementia without behavioral disturbance ([place]) During initial admission over 1 year ago had formal neuropsychiatric evaluation by Dr. Sima Matas who diagnosed patient with dementia likely related to years of schizophrenia and nonadherence to medication regimen SLUMS evaluation worsened since previous admission noting has declined from 43 to 44.  He lacks capacity to make decisions APS investigating guardianship noting no other family members or friends are willing to pursue guardianship Had previously initiated Vistaril for patient's anxiety issues.  Not effective.  During previous hospitalization I used Xanax 0.25 mg BID and this was more effective with the patient therefore I have transition to Xanax as of 3/1     Peripheral neuropathy Chronic issue and was present  during previous admission Likely related to years of substance abuse Continue low-dose Lyrica since was effective previous  A physical therapy consult is indicated based on the patients mobility assessment.   Mobility Assessment (last 72 hours)     Mobility Assessment     Row Name 11/01/21 0730 10/31/21 0730 10/30/21 1128 10/29/21 2100 10/29/21 0808   Does patient have an order for bedrest or is patient medically unstable No - Continue assessment No - Continue assessment No - Continue assessment No - Continue assessment No - Continue assessment   What is the highest level of mobility based on the progressive mobility assessment? Level 6 (Walks independently in room and hall) - Balance while walking in room without assist - Complete Level 6 (Walks independently in room and hall) - Balance while walking in room without assist - Complete Level 6 (Walks independently in room and hall) - Balance while walking in room without assist - Complete Level 6 (Walks independently in room and hall) - Balance while walking in room without assist - Complete Level 5 (Walks with assist in room/hall) - Balance while stepping forward/back and can walk in room with assist - Complete             Mixed hyperlipidemia Continue lipitor Apparently has been nonadherent with medicines prior to admission  COPD (chronic obstructive pulmonary disease) ([place]) Compensated Given Pneumovax immunization this admission  Constipation-resolved as of 11/02/2021 No bowel  movement for several days Nursing states patient has been refusing preventative laxatives-as of 2/23 patient is agreeing to take stool softeners and laxative Give one-time dose of milk of magnesia to promote bowel movement        Subjective:    Physical Exam: Vitals:   11/08/21 1942 11/08/21 2313 11/09/21 0348 11/09/21 0832  BP: (!) 148/66 (!) 146/61 (!) 153/87 140/90  Pulse: 87 72 90 79  Resp: 17 20 20 18   Temp: 98.9 F (37.2 C) 98.4 F  (36.9 C) 97.8 F (36.6 C) 98.3 F (36.8 C)  TempSrc: Oral Oral Oral Oral  SpO2: 95% 96% 99% 99%  Weight:      Height:       Gen: No acute distress, calm  Pulm: Posterior lung sounds clear to auscultation, on room air, no increased work of breathing Card: S1-S2, normotensive, no peripheral edema Abd: Soft nontender nondistended with normoactive bowel sounds.  Eating well.  LBM 3/4 Neuro: Cranial nerves II through XII grossly intact, moves all extremities x4 without any focal neurological deficits.  Sensation intact.  Mobilizes independently Psych: Alert and oriented x3 but still lacks insight into mental disabilities that prevent patient from managing care safely and independently in the home environment    Data Reviewed:  Results reviewed  Family Communication:  Patient  Disposition: Remains inpatient appropriate because:  Unsafe DC plan  Planned Discharge Destination:  Barriers to discharge: APS to determine if they will pursue guardianship. Family refusing to take pt because stating they cannot manage him noting refuses to take meds TOC is working with [facility] care coordination to develop a discharge plan for possible group home placement  Medically stable Yes  COVID vaccination status:  [employer] 07/20/2020  Consultants: Psychiatry Neurology Procedures: EEG Echocardiogram Antibiotics: None     Time spent: 20 minutes  Author: 07/22/2020, NP 11/09/2021 11:53 AM  For on call review [email or web address].
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
## 2021-11-10 DIAGNOSIS — I5022 Chronic systolic (congestive) heart failure: Secondary | ICD-10-CM | POA: Diagnosis not present

## 2021-11-10 DIAGNOSIS — J431 Panlobular emphysema: Secondary | ICD-10-CM | POA: Diagnosis not present

## 2021-11-10 DIAGNOSIS — K5900 Constipation, unspecified: Secondary | ICD-10-CM | POA: Diagnosis not present

## 2021-11-10 DIAGNOSIS — E871 Hypo-osmolality and hyponatremia: Secondary | ICD-10-CM | POA: Diagnosis not present

## 2021-11-10 DIAGNOSIS — G629 Polyneuropathy, unspecified: Secondary | ICD-10-CM

## 2021-11-10 NOTE — Progress Notes (Signed)
?Progress Note ? ? ?Patient: Jim Dunn HER:[id number] DOB: 01-19-1966 DOA: 09/24/2021     42 ?DOS: the patient was seen and examined on 11/10/2021 ?  ?Brief hospital course: ?Danthony Kendrix is a 56 y.o. male with PMH significant for schizophrenia, chronic hyponatremia, seizure disorder, tobacco use, systolic congestive heart failure, COPD who was brought to ED on 09/24/2021 by his family after they were unable to take care of him.  He was drinking a lot of water and trying to burn things. Previously he was staying at group home and was kicked out.   Neurology and psychiatry were consulted.   During hospitalization patient also had seizures.  Hospital course remarkable for acute on chronic hyponatremia, breakthrough seizures.Prolonged hospitalization.  TOC following. APS in process to determine if they will pursue guardianship ?  ? 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  Currently on fluid restriction.  On salt tablet.  Latest sodium of 130. ? ?Elevated CK ?Trended down after IV fluids. thought to be secondary to seizures. ? ?Peripheral neuropathy ?Lyrica. ? ? ?Dementia without behavioral disturbance  ?History of formal neuropsychiatric evaluation by Dr. Kieth Brightly who diagnosed patient with dementia likely related to years of schizophrenia and nonadherence to medication regimen.  APS for guardianship at this time. ? ?Paranoid schizophrenia with hallucinations ?Seen by psychiatry during hospitalization, Continue Seroquel and Invega ?Continue Xanax which has previously been well effective for this patient ? ?Seizure disorder  ?History of nonadherence to valproic acid.  EEG was negative.  Currently on Depakote.  MRI revealed arachnoid cyst that is chronic in nature. S/p  grand mal seizure with postictal phase on the evening of 2/26 presumed to be precipitated/provoked by abrupt  hyponatremia with sodium less than 120.  Valproic acid level 58.  No further reports ? ?COPD (chronic obstructive pulmonary disease)  ? compensated at this time.  Continue DuoNebs ? ?Mixed hyperlipidemia ?Continue lipitor ? ?Chronic systolic CHF (congestive heart failure) (HCC) ?Continue digoxin and Entresto, I/O and daily weights, fluid restriction due to hyponatremia ?DVT echocardiogram performed on 09/26/2021 EF of 30-35% with moderately decreased systolic function and global hypokinesis.  ? ?Constipation-resolved as of 11/02/2021 ? ?Subjective:  ?Today, patient was seen and examined at bedside.  Patient complains of some numbness of his leg but denies any shortness of breath fevers chills or rigor. ? ?Physical Exam: ?Vitals:  ? 11/10/21 0314 11/10/21 0500 11/10/21 0828 11/10/21 1202  ?BP: (!) 101/56  (!) 149/96 (!) 162/87  ?Pulse: 78  79 82  ?Resp: 20  19 18   ?Temp: 97.9 ?F (36.6 ?C)  97.7 ?F (36.5 ?C) 98 ?F (36.7 ?C)  ?TempSrc: Oral  Oral Oral  ?SpO2: 95%  100% 100%  ?Weight:  117.6 kg    ?Height:      ? ?General:  Average built, not in obvious distress ?HENT:   No scleral pallor or icterus noted. Oral mucosa is moist.  ?Chest:   Diminished breath sounds bilaterally. No crackles or wheezes.  ?CVS: S1 &S2 heard. No murmur.  Regular rate and rhythm. ?Abdomen: Soft, nontender, nondistended.  Bowel sounds are heard.   ?Extremities: No cyanosis, clubbing or edema.  Peripheral pulses are palpable. ?Psych: Alert, awake and oriented, lacks insight, normal mood ?CNS:  No cranial nerve deficits.  Power equal in all extremities.   ?Skin: Warm and dry.  No rashes noted. ? ? ?Data Reviewed: ? ?Results reviewed ? ?Family Communication:  ?  Patient at bedside ? ?Disposition: ?Remains inpatient appropriate because:  Unsafe DC plan ? ?Planned Discharge Destination:  ?Barriers to discharge: APS to determine if they will pursue guardianship. Family refusing to take pt because stating they cannot manage him noting refuses to take meds  TOC is working with [facility] care coordination to develop a discharge plan for possible group home placement ? ?Medically stable ?Yes ? ?Consultants: ?Psychiatry ?Neurology ? ?Procedures: ?EEG ?Echocardiogram ? ?Antibiotics: ?None ? ? ?Author: ?Joycelyn Das, MD ?11/10/2021 12:44 PM ? ?For on call review [email or web address]  ? ?

## 2021-11-11 DIAGNOSIS — E871 Hypo-osmolality and hyponatremia: Secondary | ICD-10-CM | POA: Diagnosis not present

## 2021-11-11 NOTE — Progress Notes (Signed)
Progress Note   Patient: Jim Dunn [id number] DOB: [birth year] DOA: 09/24/2021     43 DOS: the patient was seen and examined on 11/11/2021   Brief hospital course: Jim Dunn is a 56 y.o. male with PMH significant for schizophrenia, chronic hyponatremia, seizure disorder, tobacco use, systolic congestive heart failure, COPD who was brought to ED on 09/24/2021 by his family after they were unable to take care of him.  He was drinking a lot of water and trying to burn things. Previously he was staying at group home and was kicked out.   Neurology and psychiatry were consulted.   Hospital course remarkable for acute on chronic hyponatremia, breakthrough seizures. Prolonged hospitalization.   TOC following.  APS in process to determine if they will pursue guardianship    Patient's behaviors have been stable.  Process was put in place to allow patient to briefly leave the floor given long length of stay.  Criteria were as follows: Must notify the nurse when he was leaving the floor, would not be allowed to leave the floor for longer than 30 minutes and he was not allowed to leave hospital grounds.  If any 1 of these criteria were broken off unit privileges would be revoked.  It is noted that eventual discharge plan would be to a group home although 24/7 monitoring of fluid intake would be difficult in that setting.  On the evening of 2/26 patient developed a grand mal seizure and it was noted his sodium had decreased significantly to 117.  It was felt that patient had been drinking fluids in his room and off the unit leading to hypervolemic hyponatremia.  He has a history of polydipsia and has had seizures related to this in the past.  He was placed on hypertonic saline and transferred to the ICU service.  Of note patient had been on Lasix, total fluid restriction versus only free water restriction and salt tablets.  As of now Lasix has been discontinued.  Fluid restriction pertains to only free water  and prior to ICU team assuming care orders changed to allow pt to consume Gatorade and other electrolyte-based fluids to help with excessive thirst.     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  2/26 had hyponatremic related seizure with nadir all sodium 116.  Required 3% saline for correction and ICU stay.  Last sodium 130 on 3/7.  We will check weekly. Past 24-hour intake of fluids including electrolyte-based fluids is 1680 cc Continue to hold Lasix.  Restriction will focus on free water only (including coffee).  Patient is allowed electrolyte-based fluids such as Gatorade as well as juices and milk that do not count towards the fluid restriction Patient had initial CK after seizure greater than 3000 which has now normalized    Seizure disorder ([place]) Continue Depakote-valproic acid level 41 previous level on 2/10 was 0.2 History of nonadherence and valproic acid on 1/22 was 19 EEG negative for acute seizure activity Continue seizure precautions Depakote can also function as a mood stabilizer MRI revealed arachnoid cyst that is chronic in nature S/p  grand mal seizure with postictal phase on the evening of 2/26 presumed to be precipitated/provoked by abrupt hyponatremia with sodium less than 120.  Valproic acid level 58  Elevated CK Secondary to recent seizure  activity as well as volume depletion Encourage oral intake of electrolyte based fluids CK trend is downward.  2298 > 1926 > 1616 > 970 and now is less than 400  Paranoid schizophrenia with hallucinations Appreciate psychiatric team help Schizophrenia symptoms and decompensation worsened by recurrent hyponatremia Also has a component of anxiety and during previous admission requested  antianxiety medications Continue Seroquel and Invega Discontinuing Vistaril in favor of Xanax which has previously been well effective for this patient Have allowed IVC to expire since no longer needed  Chronic systolic CHF (congestive heart failure) (HCC) Continue digoxin and Entresto  Lasix discontinued during recent episode of acute hyponatremia with seizure activity as well as hypotension we will continue to hold until CK normalized Digoxin level subtherapeutic as of 2/10 but this also appears to be a chronic issue.  Follow periodically Strict I/O and daily weights Fluid restriction for chronic hyponatremia for free water primarily-allowing gatorade and other electrolyte based fluids to not be included in FR Last echo: 5/21 with EF of 25-30% with global hypokinesis, RV overload and indeterminate diastolic dysfunction. Does not appear that he has followed up with cardiology. Gets meds from ED.  Echocardiogram performed on 09/26/2021 EF of 30-35% with moderately decreased systolic function and global hypokinesis. RV size and systolic function is normal. Weight measurements have been inconsistent and appear to be inaccurate  Dementia without behavioral disturbance ([place]) During initial admission over 1 year ago had formal neuropsychiatric evaluation by Dr. Sima Matas who diagnosed patient with dementia likely related to years of schizophrenia and nonadherence to medication regimen SLUMS evaluation worsened since previous admission noting has declined from 43 to 44.  He lacks capacity to make decisions APS investigating guardianship noting no other family members or friends are willing to pursue guardianship Had previously initiated Vistaril for patient's anxiety issues.  Not effective.  During previous hospitalization I used Xanax 0.25 mg BID and this was more effective with the patient therefore I have transition to Xanax as of 3/1     Peripheral neuropathy Chronic issue and was present  during previous admission Likely related to years of substance abuse Continue low-dose Lyrica since was effective previous  A physical therapy consult is indicated based on the patients mobility assessment.   Mobility Assessment (last 72 hours)     Mobility Assessment     Row Name 11/01/21 0730 10/31/21 0730 10/30/21 1128 10/29/21 2100 10/29/21 0808   Does patient have an order for bedrest or is patient medically unstable No - Continue assessment No - Continue assessment No - Continue assessment No - Continue assessment No - Continue assessment   What is the highest level of mobility based on the progressive mobility assessment? Level 6 (Walks independently in room and hall) - Balance while walking in room without assist - Complete Level 6 (Walks independently in room and hall) - Balance while walking in room without assist - Complete Level 6 (Walks independently in room and hall) - Balance while walking in room without assist - Complete Level 6 (Walks independently in room and hall) - Balance while walking in room without assist - Complete Level 5 (Walks with assist in room/hall) - Balance while stepping forward/back and can walk in room with assist - Complete             Mixed hyperlipidemia Continue lipitor Apparently has been nonadherent with medicines prior to admission  COPD (chronic obstructive pulmonary disease) ([place]) Compensated Given Pneumovax immunization this admission  Constipation-resolved as of 11/02/2021 No bowel  movement for several days Nursing states patient has been refusing preventative laxatives-as of 2/23 patient is agreeing to take stool softeners and laxative Give one-time dose of milk of magnesia to promote bowel movement        Subjective:    Physical Exam: Vitals:   11/10/21 1632 11/10/21 1910 11/10/21 2355 11/11/21 0415  BP: (!) 159/99 (!) 160/88 124/60 130/78  Pulse: 86 82 81 71  Resp: 18 18 17 17   Temp: 97.8 F (36.6 C) 98.2 F (36.8  C) 98.7 F (37.1 C) 97.6 F (36.4 C)  TempSrc: Oral Oral Oral Axillary  SpO2: 100% 100% 98% 97%  Weight:      Height:       Gen: No acute distress, calm  Pulm: Posterior lung sounds clear to auscultation, on room air, no increased work of breathing Card: S1-S2, normotensive, no peripheral edema Abd: Soft nontender nondistended with normoactive bowel sounds.  Eating well.  LBM 3/4 Neuro: Cranial nerves II through XII grossly intact, moves all extremities x4 without any focal neurological deficits.  Sensation intact.  Mobilizes independently Psych: Alert and oriented x3 but still lacks insight into mental disabilities that prevent patient from managing care safely and independently in the home environment    Data Reviewed:  Results reviewed  Family Communication:  Patient  Disposition: Remains inpatient appropriate because:  Unsafe DC plan  Planned Discharge Destination:  Barriers to discharge: APS to determine if they will pursue guardianship. Family refusing to take pt because stating they cannot manage him noting refuses to take meds TOC is working with [place] care coordination to develop a discharge plan for possible group home placement  Medically stable Yes  COVID vaccination status:  [place] 07/20/2020  Consultants: Psychiatry Neurology Procedures: EEG Echocardiogram Antibiotics: None     Time spent: 20 minutes  Author: Erin Hearing, NP 11/11/2021 8:05 AM  For on call review [email or web address].

## 2021-11-12 DIAGNOSIS — J431 Panlobular emphysema: Secondary | ICD-10-CM | POA: Diagnosis not present

## 2021-11-12 DIAGNOSIS — K5900 Constipation, unspecified: Secondary | ICD-10-CM | POA: Diagnosis not present

## 2021-11-12 DIAGNOSIS — I5022 Chronic systolic (congestive) heart failure: Secondary | ICD-10-CM | POA: Diagnosis not present

## 2021-11-12 DIAGNOSIS — E871 Hypo-osmolality and hyponatremia: Secondary | ICD-10-CM | POA: Diagnosis not present

## 2021-11-12 LAB — COMPREHENSIVE METABOLIC PANEL
ALT: 10 U/L (ref 0–44)
AST: 15 U/L (ref 15–41)
Albumin: 3.5 g/dL (ref 3.5–5.0)
Alkaline Phosphatase: 54 U/L (ref 38–126)
Anion gap: 9 (ref 5–15)
BUN: 10 mg/dL (ref 6–20)
CO2: 25 mmol/L (ref 22–32)
Calcium: 8.8 mg/dL — ABNORMAL LOW (ref 8.9–10.3)
Chloride: 96 mmol/L — ABNORMAL LOW (ref 98–111)
Creatinine, Ser: 0.65 mg/dL (ref 0.61–1.24)
GFR, Estimated: >60 mL/min (ref >60)
Glucose, Bld: 96 mg/dL (ref 70–99)
Potassium: 4.1 mmol/L (ref 3.5–5.1)
Sodium: 130 mmol/L — ABNORMAL LOW (ref 135–145)
Total Bilirubin: 0.3 mg/dL (ref 0.3–1.2)
Total Protein: 6 g/dL — ABNORMAL LOW (ref 6.5–8.1)

## 2021-11-12 LAB — CBC WITH DIFFERENTIAL/PLATELET
Abs Immature Granulocytes: 0.07 10*3/uL (ref 0.00–0.07)
Basophils Absolute: 0 10*3/uL (ref 0.0–0.1)
Basophils Relative: 1 %
Eosinophils Absolute: 0.2 10*3/uL (ref 0.0–0.5)
Eosinophils Relative: 2 %
HCT: 34.6 % — ABNORMAL LOW (ref 39.0–52.0)
Hemoglobin: 11.7 g/dL — ABNORMAL LOW (ref 13.0–17.0)
Immature Granulocytes: 1 %
Lymphocytes Relative: 48 %
Lymphs Abs: 3.9 10*3/uL (ref 0.7–4.0)
MCH: 23.1 pg — ABNORMAL LOW (ref 26.0–34.0)
MCHC: 33.8 g/dL (ref 30.0–36.0)
MCV: 68.2 fL — ABNORMAL LOW (ref 80.0–100.0)
Monocytes Absolute: 1 10*3/uL (ref 0.1–1.0)
Monocytes Relative: 13 %
Neutro Abs: 2.8 10*3/uL (ref 1.7–7.7)
Neutrophils Relative %: 35 %
Platelets: 227 10*3/uL (ref 150–400)
RBC: 5.07 MIL/uL (ref 4.22–5.81)
RDW: 16 % — ABNORMAL HIGH (ref 11.5–15.5)
WBC: 7.9 10*3/uL (ref 4.0–10.5)
nRBC: 0 % (ref 0.0–0.2)

## 2021-11-12 MED ORDER — ALPRAZOLAM 0.5 MG PO TABS
0.5000 mg | ORAL_TABLET | Freq: Once | ORAL | Status: AC
  Administered 2021-11-12: 0.5 mg via ORAL
  Filled 2021-11-12: qty 1

## 2021-11-12 NOTE — Plan of Care (Signed)
?  Problem: Education: ?Goal: Knowledge of General Education information will improve ?Description: Including pain rating scale, medication(s)/side effects and non-pharmacologic comfort measures ?Outcome: Progressing ?  ?Problem: Health Behavior/Discharge Planning: ?Goal: Ability to manage health-related needs will improve ?Outcome: Progressing ?  ?Problem: Clinical Measurements: ?Goal: Ability to maintain clinical measurements within normal limits will improve ?Outcome: Progressing ?  ?Problem: Health Behavior/Discharge Planning: ?Goal: Ability to manage health-related needs will improve ?Outcome: Progressing ?  ?Problem: Clinical Measurements: ?Goal: Ability to maintain clinical measurements within normal limits will improve ?Outcome: Progressing ?Goal: Will remain free from infection ?Outcome: Progressing ?Goal: Diagnostic test results will improve ?Outcome: Progressing ?Goal: Respiratory complications will improve ?Outcome: Progressing ?Goal: Cardiovascular complication will be avoided ?Outcome: Progressing ?  ?Problem: Coping: ?Goal: Level of anxiety will decrease ?Outcome: Progressing ?  ?Problem: Elimination: ?Goal: Will not experience complications related to bowel motility ?Outcome: Progressing ?Goal: Will not experience complications related to urinary retention ?Outcome: Progressing ?  ?Problem: Safety: ?Goal: Ability to remain free from injury will improve ?Outcome: Progressing ?  ?Problem: Skin Integrity: ?Goal: Risk for impaired skin integrity will decrease ?Outcome: Progressing ?  ?

## 2021-11-12 NOTE — Progress Notes (Signed)
?Progress Note ? ? ?Patient: Jim Dunn [id number] DOB: 09/10/1965 DOA: 09/24/2021     44 ?DOS: the patient was seen and examined on 11/12/2021 ?  ?Brief hospital course: ?Michaelanthony Kempton is a 56 y.o. male with PMH significant for schizophrenia, chronic hyponatremia, seizure disorder, tobacco use, systolic congestive heart failure, COPD who was brought to ED on 09/24/2021 by his family after they were unable to take care of him.  He was drinking a lot of water and trying to burn things. Previously he was staying at group home and was kicked out.   Neurology and psychiatry were consulted.   During hospitalization patient also had seizures.  Hospital course remarkable for acute on chronic hyponatremia, breakthrough seizures.Prolonged hospitalization.  TOC following. APS in process to determine if they will pursue guardianship ?  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  Currently on fluid restriction.  On salt tablet.  Latest sodium of 130. ? ?Elevated CK ?Trended down after IV fluids. thought to be secondary to seizures. ? ?Peripheral neuropathy ?Continue Lyrica. ? ?Dementia without behavioral disturbance  ?History of formal neuropsychiatric evaluation by Dr. Kieth Brightly who diagnosed patient with dementia likely related to years of schizophrenia and nonadherence to medication regimen.  APS for guardianship at this time. ? ?Paranoid schizophrenia with hallucinations ?Seen by psychiatry during hospitalization, Continue Seroquel and Invega ?Continue Xanax which has previously been well effective for this patient ? ?Seizure disorder  ?History of nonadherence to valproic acid.  EEG was negative.  Currently on Depakote.  MRI revealed arachnoid cyst that is chronic in nature. S/p  grand mal seizure with postictal phase on the evening of 2/26 presumed to be precipitated/provoked by  abrupt hyponatremia with sodium less than 120.  Valproic acid level 58.  No further reports ? ?COPD (chronic obstructive pulmonary disease)  ? compensated at this time.  Continue DuoNebs ? ?Mixed hyperlipidemia ?Continue lipitor ? ?Chronic systolic CHF (congestive heart failure) (HCC) ?Continue digoxin and Entresto, I/O and daily weights, fluid restriction due to hyponatremia ?DVT echocardiogram performed on 09/26/2021 EF of 30-35% with moderately decreased systolic function and global hypokinesis.  ? ?Constipation-resolved as of 11/02/2021 ? ?Subjective:  ?Today, patient was seen and examined at bedside.  Patient states that he could not sleep well in the nighttime.  Denies any shortness of breath cough fever chills or rigor.  He is at bedside. ? ? ?Physical Exam: ?Vitals:  ? 11/11/21 1930 11/11/21 2346 11/12/21 0459 11/12/21 0731  ?BP: (!) 169/77 (!) 150/69 (!) 156/88 (!) 168/104  ?Pulse: 73 79 75 73  ?Resp: 16 17 18 20   ?Temp: 98.4 ?F (36.9 ?C) 97.8 ?F (36.6 ?C) (!) 97.5 ?F (36.4 ?C) (!) 97.3 ?F (36.3 ?C)  ?TempSrc: Oral Oral Axillary Oral  ?SpO2: 99% 97% 99% 100%  ?Weight:      ?Height:      ? ?General:  Average built, not in obvious distress ?HENT:   No scleral pallor or icterus noted. Oral mucosa is moist.  ?Chest:  Clear breath sounds.  Diminished breath sounds bilaterally. No crackles or wheezes.  ?CVS: S1 &S2 heard. No murmur.  Regular rate and rhythm. ?Abdomen: Soft, nontender, nondistended.  Bowel sounds are heard.   ?Extremities: No cyanosis, clubbing or edema.  Peripheral pulses are palpable. ?Psych: Alert, awake and oriented, lacks insight, ?CNS:  No cranial nerve deficits.  Power equal in all extremities.   ?Skin: Warm and  dry.  No rashes noted. ? ? ?Data Reviewed: ? ?Results reviewed ? ?Family Communication:  ?Patient at bedside ? ?Disposition: ?Remains inpatient appropriate because:  Unsafe DC plan ? ?Planned Discharge Destination:  ?Barriers to discharge: APS to determine if they will pursue  guardianship. Family refusing to take pt because stating they cannot manage him noting refuses to take meds TOC is working with [facility] care coordination to develop a discharge plan for possible group home placement ? ?Medically stable ?Yes ? ?Consultants: ?Psychiatry ?Neurology ? ?Procedures: ?EEG ?Echocardiogram ? ?Antibiotics: ?None ? ? ?Author: ?Joycelyn Das, MD ?11/12/2021 9:51 AM ? ?For on call review [email or web address].  ? ?

## 2021-11-13 DIAGNOSIS — I5022 Chronic systolic (congestive) heart failure: Secondary | ICD-10-CM | POA: Diagnosis not present

## 2021-11-13 DIAGNOSIS — J431 Panlobular emphysema: Secondary | ICD-10-CM | POA: Diagnosis not present

## 2021-11-13 DIAGNOSIS — K5900 Constipation, unspecified: Secondary | ICD-10-CM | POA: Diagnosis not present

## 2021-11-13 DIAGNOSIS — E871 Hypo-osmolality and hyponatremia: Secondary | ICD-10-CM | POA: Diagnosis not present

## 2021-11-13 NOTE — Progress Notes (Signed)
?Progress Note ? ? ?Patient: Jim Dunn DOB: 07-14-1966 DOA: 09/24/2021     45 ?DOS: the patient was seen and examined on 11/13/2021 ?  ?Brief hospital course: ?Jim Dunn is a 56 y.o. male with PMH significant for schizophrenia, chronic hyponatremia, seizure disorder, tobacco use, systolic congestive heart failure, COPD who was brought to ED on 09/24/2021 by his family after they were unable to take care of him.  He was drinking a lot of water and trying to burn things. Previously he was staying at group home and was kicked out.   Neurology and psychiatry were consulted.   During hospitalization patient also had seizures.  Hospital course remarkable for acute on chronic hyponatremia, breakthrough seizures.Prolonged hospitalization.  TOC following. APS in process to determine if they will pursue guardianship ?  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  Currently on fluid restriction.  On salt tablet.  Latest sodium of 130. ? ?Elevated CK ?Trended down after IV fluids. thought to be secondary to seizures. ? ?Peripheral neuropathy ?Continue Lyrica. ? ?Dementia without behavioral disturbance  ?History of formal neuropsychiatric evaluation by Dr. Kieth Brightly who diagnosed patient with dementia likely related to years of schizophrenia and nonadherence to medication regimen.  APS for guardianship at this time. ? ?Paranoid schizophrenia with hallucinations ?Seen by psychiatry during hospitalization, Continue Seroquel and Invega ?Continue Xanax which has previously been well effective for this patient ? ?Seizure disorder  ?History of nonadherence to valproic acid.  EEG was negative.  Currently on Depakote.  MRI revealed arachnoid cyst that is chronic in nature. S/p  grand mal seizure with postictal phase on the evening of 2/26 presumed to be precipitated/provoked by  abrupt hyponatremia with sodium less than 120.  Valproic acid level 58.  No further reports ? ?COPD (chronic obstructive pulmonary disease)  ? compensated at this time.  Continue DuoNebs ? ?Mixed hyperlipidemia ?Continue lipitor ? ?Chronic systolic CHF (congestive heart failure) (HCC) ?Continue digoxin and Entresto, I/O and daily weights, fluid restriction due to hyponatremia ?DVT echocardiogram performed on 09/26/2021 EF of 30-35% with moderately decreased systolic function and global hypokinesis.  ? ?Constipation-resolved as of 11/02/2021 ? ?Subjective:  ?Today, patient was seen and examined at bedside.  Felt anxious yesterday.  Wants to go out and walk around and wants to remove his IVC. ? ?Physical Exam: ?Vitals:  ? 11/12/21 2338 11/13/21 0440 11/13/21 0500 11/13/21 0930  ?BP: (!) 152/78 128/76    ?Pulse: 60 (!) 57  74  ?Resp: 16 15    ?Temp: 98.2 ?F (36.8 ?C) 97.8 ?F (36.6 ?C)    ?TempSrc: Oral Oral    ?SpO2: 100% 100%    ?Weight:   110.1 kg   ?Height:      ? ?General:  Average built, not in obvious distress ?HENT:   No scleral pallor or icterus noted. Oral mucosa is moist.  ?Chest:  Clear breath sounds.  Diminished breath sounds bilaterally. No crackles or wheezes.  ?CVS: S1 &S2 heard. No murmur.  Regular rate and rhythm. ?Abdomen: Soft, nontender, nondistended.  Bowel sounds are heard.   ?Extremities: No cyanosis, clubbing or edema.  Peripheral pulses are palpable. ?Psych: Alert, awake and oriented lacks insight. ?CNS:  No cranial nerve deficits.  Power equal in all extremities.   ?Skin: Warm and dry.  No rashes noted. ? ?Data Reviewed: ? ?Results reviewed ? ?Family Communication:  ?Patient at bedside ? ?Disposition: ?  Remains inpatient appropriate because:  Unsafe DC plan ? ?Planned Discharge Destination:  ?Barriers to discharge: APS to determine if they will pursue guardianship. Family refusing to take pt because stating they cannot manage him noting refuses to take meds TOC is working with [facility] care  coordination to develop a discharge plan for possible group home placement ? ?Medically stable ?Yes ? ?Consultants: ?Psychiatry ?Neurology ? ?Procedures: ?EEG ?Echocardiogram ? ?Antibiotics: ?None ? ? ?Author: ?Joycelyn Das, MD ?11/13/2021 11:02 AM ? ?For on call review [email or web address]  ? ?

## 2021-11-14 DIAGNOSIS — E871 Hypo-osmolality and hyponatremia: Secondary | ICD-10-CM | POA: Diagnosis not present

## 2021-11-14 NOTE — Progress Notes (Signed)
Progress Note   Patient: Jim Dunn CNO:[id number] DOB: [birth year] DOA: 09/24/2021     56 DOS: the patient was seen and examined on 11/14/2021   Brief hospital course: Jim Dunn is a 56 y.o. male with PMH significant for schizophrenia, chronic hyponatremia, seizure disorder, tobacco use, systolic congestive heart failure, COPD who was brought to ED on 09/24/2021 by his family after they were unable to take care of him.  He was drinking a lot of water and trying to burn things. Previously he was staying at group home and was kicked out.   Neurology and psychiatry were consulted.   Hospital course remarkable for acute on chronic hyponatremia, breakthrough seizures. Prolonged hospitalization.   TOC following.  APS in process to determine if they will pursue guardianship    Patient's behaviors have been stable.  Process was put in place to allow patient to briefly leave the floor given long length of stay.  Criteria were as follows: Must notify the nurse when he was leaving the floor, would not be allowed to leave the floor for longer than 30 minutes and he was not allowed to leave hospital grounds.  If any 1 of these criteria were broken off unit privileges would be revoked.  It is noted that eventual discharge plan would be to a group home although 24/7 monitoring of fluid intake would be difficult in that setting.  On the evening of 2/26 patient developed a grand mal seizure and it was noted his sodium had decreased significantly to 117.  It was felt that patient had been drinking fluids in his room and off the unit leading to hypervolemic hyponatremia.  He has a history of polydipsia and has had seizures related to this in the past.  He was placed on hypertonic saline and transferred to the ICU service.  Of note patient had been on Lasix, total fluid restriction versus only free water restriction and salt tablets.  As of now Lasix has been discontinued.  Fluid restriction pertains to only free water  and prior to ICU team assuming care orders changed to allow pt to consume Gatorade and other electrolyte-based fluids to help with excessive thirst.     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  2/26 had hyponatremic related seizure with nadir all sodium 116.  Required 3% saline for correction and ICU stay.  Last sodium 130 on 3/7.  We will check weekly. Past 24-hour intake of fluids including electrolyte-based fluids is 1680 cc Continue to hold Lasix.  Restriction will focus on free water only (including coffee).  Patient is allowed electrolyte-based fluids such as Gatorade as well as juices and milk that do not count towards the fluid restriction Patient had initial CK after seizure greater than 3000 which has now normalized    Seizure disorder (HCC) Continue Depakote-valproic acid level 41 previous level on 2/10 was 0.2 History of nonadherence and valproic acid on 1/22 was 19 EEG negative for acute seizure activity Continue seizure precautions Depakote can also function as a mood stabilizer MRI revealed arachnoid cyst that is chronic in nature S/p  grand mal seizure with postictal phase on the evening of 2/26 presumed to be precipitated/provoked by abrupt hyponatremia with sodium less than 120.  Valproic acid level 58  Elevated CK Secondary to recent seizure  activity as well as volume depletion Encourage oral intake of electrolyte based fluids CK trend is downward.  2298 > 1926 > 1616 > 970 and now is less than 400  Paranoid schizophrenia with hallucinations Appreciate psychiatric team help Schizophrenia symptoms and decompensation worsened by recurrent hyponatremia Also has a component of anxiety and during previous admission requested  antianxiety medications Continue Seroquel and Invega Discontinuing Vistaril in favor of Xanax which has previously been well effective for this patient Have allowed IVC to expire since no longer needed Patient is very appreciative of LCSW [name] assistance  Chronic systolic CHF (congestive heart failure) (HCC) Continue digoxin and Entresto  Lasix discontinued during recent episode of acute hyponatremia with seizure activity as well as hypotension we will continue to hold until CK normalized Digoxin level subtherapeutic as of 2/10 but this also appears to be a chronic issue.  Follow periodically Strict I/O and daily weights Fluid restriction for chronic hyponatremia for free water primarily-allowing gatorade and other electrolyte based fluids to not be included in FR Last echo: 5/21 with EF of 25-30% with global hypokinesis, RV overload and indeterminate diastolic dysfunction. Does not appear that he has followed up with cardiology. Gets meds from ED.  Echocardiogram performed on 09/26/2021 EF of 30-35% with moderately decreased systolic function and global hypokinesis. RV size and systolic function is normal. Weight measurements have been inconsistent and appear to be inaccurate  Dementia without behavioral disturbance (HCC) During initial admission over 1 year ago had formal neuropsychiatric evaluation by Dr. Kieth Brightly who diagnosed patient with dementia likely related to years of schizophrenia and nonadherence to medication regimen SLUMS evaluation worsened since previous admission noting has declined from 57 to 75.  He lacks capacity to make decisions APS investigating guardianship noting no other family members or friends are willing to pursue guardianship Had previously initiated Vistaril for patient's anxiety issues.  Not effective.  During previous hospitalization I used Xanax 0.25 mg BID and this was more effective with the patient therefore I have transition to Xanax as of  3/1     Peripheral neuropathy Chronic issue and was present during previous admission Likely related to years of substance abuse Continue low-dose Lyrica since was effective previous  A physical therapy consult is indicated based on the patients mobility assessment.   Mobility Assessment (last 72 hours)     Mobility Assessment     Row Name 11/01/21 0730 10/31/21 0730 10/30/21 1128 10/29/21 2100 10/29/21 0808   Does patient have an order for bedrest or is patient medically unstable No - Continue assessment No - Continue assessment No - Continue assessment No - Continue assessment No - Continue assessment   What is the highest level of mobility based on the progressive mobility assessment? Level 6 (Walks independently in room and hall) - Balance while walking in room without assist - Complete Level 6 (Walks independently in room and hall) - Balance while walking in room without assist - Complete Level 6 (Walks independently in room and hall) - Balance while walking in room without assist - Complete Level 6 (Walks independently in room and hall) - Balance while walking in room without assist - Complete Level 5 (Walks with assist in room/hall) - Balance while stepping forward/back and can walk in room with assist - Complete             Mixed hyperlipidemia Continue lipitor Apparently has been nonadherent with medicines prior to admission  COPD (chronic obstructive pulmonary disease) (HCC) Compensated Given Pneumovax immunization  this admission  Constipation-resolved as of 11/02/2021 No bowel movement for several days Nursing states patient has been refusing preventative laxatives-as of 2/23 patient is agreeing to take stool softeners and laxative Give one-time dose of milk of magnesia to promote bowel movement        Subjective:  Patient evaluated while standing at the nurses station.  Staff asked me to reiterate to him that he can go outside but he is not allowed to smoke.   He is not allowed to leave campus to smoke even with staff member.  He also needs to understand that he is not to ask visitors for cigarettes.  Physical Exam: Vitals:   11/13/21 2000 11/13/21 2341 11/14/21 0407 11/14/21 0415  BP: (!) 163/98 (!) 155/84 126/81   Pulse: 85 85 85   Resp: 16 16 14    Temp: 98.8 F (37.1 C) 98.6 F (37 C) 98.6 F (37 C)   TempSrc: Oral Oral Oral   SpO2: 99% 96% 100%   Weight:    109.4 kg  Height:       Gen: No acute distress, calm  Pulm: Posterior lung sounds clear to auscultation, on room air, no increased work of breathing Card: S1-S2, normotensive, no peripheral edema Abd: Soft nontender nondistended with normoactive bowel sounds.  Eating well.  LBM 3/12 Neuro: Cranial nerves II through XII grossly intact, moves all extremities x4 without any focal neurological deficits.  Sensation intact.  Mobilizes independently Psych: Alert and oriented x3 but still lacks insight into mental disabilities that prevent patient from managing care safely and independently in the home environment    Data Reviewed:  Results reviewed  Family Communication:  Patient  Disposition: Remains inpatient appropriate because:  Unsafe DC plan  Planned Discharge Destination:  Barriers to discharge: APS to determine if they will pursue guardianship. Family refusing to take pt because stating they cannot manage him noting refuses to take meds TOC is working with [facility] care coordination to develop a discharge plan for possible group home placement  Medically stable Yes  COVID vaccination status:  [employer] 07/20/2020  Consultants: Psychiatry Neurology Procedures: EEG Echocardiogram Antibiotics: None     Time spent: 20 minutes  Author: Junious Silk, NP 11/14/2021 8:31 AM  For on call review [email or web address].

## 2021-11-15 DIAGNOSIS — E871 Hypo-osmolality and hyponatremia: Secondary | ICD-10-CM | POA: Diagnosis not present

## 2021-11-15 MED ORDER — ALPRAZOLAM 0.5 MG PO TABS
0.5000 mg | ORAL_TABLET | Freq: Two times a day (BID) | ORAL | Status: DC
Start: 2021-11-15 — End: 2022-02-12
  Administered 2021-11-15 – 2022-02-12 (×178): 0.5 mg via ORAL
  Filled 2021-11-15 (×178): qty 1

## 2021-11-15 MED ORDER — NICOTINE 21 MG/24HR TD PT24
21.0000 mg | MEDICATED_PATCH | Freq: Every day | TRANSDERMAL | Status: DC
  Administered 2021-11-16 – 2021-11-22 (×7): 21 mg via TRANSDERMAL
  Filled 2021-11-15 (×7): qty 1

## 2021-11-15 NOTE — Progress Notes (Signed)
?Progress Note ? ? ?Patient: Jim Dunn UVJ:[id number] DOB: 07-Nov-1965 DOA: 09/24/2021     49 ?DOS: the patient was seen and examined on 11/15/2021 ?  ?Brief hospital course: ?Jim Dunn is a 56 y.o. male with PMH significant for schizophrenia, chronic hyponatremia, seizure disorder, tobacco use, systolic congestive heart failure, COPD who was brought to ED on 09/24/2021 by his family after they were unable to take care of him.  He was drinking a lot of water and trying to burn things. Previously he was staying at group home and was kicked out.   ?Neurology and psychiatry were consulted.   ?Hospital course remarkable for acute on chronic hyponatremia, breakthrough seizures. ?Prolonged hospitalization.   ?TOC following.  ?APS in process to determine if they will pursue guardianship ?  ? ?Patient's behaviors have been stable.  Process was put in place to allow patient to briefly leave the floor given long length of stay.  Criteria were as follows: Must notify the nurse when he was leaving the floor, would not be allowed to leave the floor for longer than 30 minutes and he was not allowed to leave hospital grounds.  If any 1 of these criteria were broken off unit privileges would be revoked.  It is noted that eventual discharge plan would be to a group home although 24/7 monitoring of fluid intake would be difficult in that setting. ? ?On the evening of 2/26 patient developed a grand mal seizure and it was noted his sodium had decreased significantly to 117.  It was felt that patient had been drinking fluids in his room and off the unit leading to hypervolemic hyponatremia.  He has a history of polydipsia and has had seizures related to this in the past.  He was placed on hypertonic saline and transferred to the ICU service.  Of note patient had been on Lasix, total fluid restriction versus only free water restriction and salt tablets.  As of now Lasix has been discontinued.  Fluid restriction pertains to only free water  and prior to ICU team assuming care orders changed to allow pt to consume Gatorade and other electrolyte-based fluids to help with excessive thirst. ? ? ? ? 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  ?2/26 had hyponatremic related seizure with nadir all sodium 116.  Current sodium stable ?Continue to hold Lasix.  Restriction will focus on free water only (including coffee).  Patient is allowed electrolyte-based fluids such as Gatorade as well as juices and milk that do not count towards the fluid restriction ?Patient had initial CK after seizure greater than 3000 which has now normalized ? ? ? ?Seizure disorder (HCC) ?Continue Depakote-valproic acid level 41 previous level on 2/10 was 0.2 ?History of nonadherence and valproic acid on 1/22 was 19 ?EEG negative for acute seizure activity ?Continue seizure precautions ?Depakote can also function as a mood stabilizer ?MRI revealed arachnoid cyst that is chronic in nature ?S/p  grand mal seizure with postictal phase on the evening of 2/26 presumed to be precipitated/provoked by abrupt hyponatremia with sodium less than 120.  Valproic acid level 58 ? ?Elevated CK ?Secondary to recent seizure activity as well as volume depletion ?Encourage oral intake of electrolyte based fluids ?CK trend is downward.  2298 > 1926 > 1616 > 970 and now  is less than 400 ? ?Paranoid schizophrenia with hallucinations ?Appreciate psychiatric team help ?Schizophrenia symptoms and decompensation worsened by recurrent hyponatremia ?Also has a component of anxiety and during previous admission requested antianxiety medications ?Continue Seroquel and Invega ?Continue Xanax but increased from 0.25 to 0.5 mg since this will also help with tobacco  cravings ?Have allowed IVC to expire since no longer needed ?Patient is very appreciative of LCSW [name] assistance ? ?Chronic systolic CHF (congestive heart failure) (HCC) ?Continue digoxin and Entresto  ?Lasix discontinued during recent episode of acute hyponatremia with seizure activity as well as hypotension we will continue to hold until CK normalized ?Digoxin level subtherapeutic as of 2/10 but this also appears to be a chronic issue.  Follow periodically ?Strict I/O and daily weights ?Fluid restriction for chronic hyponatremia for free water primarily-allowing gatorade and other electrolyte based fluids to not be included in FR ?Last echo: 5/21 with EF of 25-30% with global hypokinesis, RV overload and indeterminate diastolic dysfunction. Does not appear that he has followed up with cardiology. Gets meds from ED.  ?Echocardiogram performed on 09/26/2021 EF of 30-35% with moderately decreased systolic function and global hypokinesis. RV size and systolic function is normal. ?Weight measurements have been inconsistent and appear to be inaccurate ? ?Dementia without behavioral disturbance (HCC) ?During initial admission over 1 year ago had formal neuropsychiatric evaluation by Dr. Kieth Brightly who diagnosed patient with dementia likely related to years of schizophrenia and nonadherence to medication regimen ?SLUMS evaluation worsened since previous admission noting has declined from 18 to 12.  He lacks capacity to make decisions ?APS investigating guardianship noting no other family members or friends are willing to pursue guardianship ?Had previously initiated Vistaril for patient's anxiety issues.  Not effective.  During previous hospitalization I used Xanax 0.25 mg BID and this was more effective with the patient therefore I have transition to Xanax as of 3/1 ? ? ? ? ?Peripheral neuropathy ?Chronic issue and was present during previous admission ?Likely related to years of substance abuse ?Continue  low-dose Lyrica since was effective previous ? ?A physical therapy consult is indicated based on the patient?s mobility assessment. ? ? ?Mobility Assessment (last 72 hours)   ? ? Mobility Assessment   ? ? Row Name 11/01/21 0730 10/31/21 0730 10/30/21 1128 10/29/21 2100 10/29/21 0808  ? Does patient have an order for bedrest or is patient medically unstable No - Continue assessment No - Continue assessment No - Continue assessment No - Continue assessment No - Continue assessment  ? What is the highest level of mobility based on the progressive mobility assessment? Level 6 (Walks independently in room and hall) - Balance while walking in room without assist - Complete Level 6 (Walks independently in room and hall) - Balance while walking in room without assist - Complete Level 6 (Walks independently in room and hall) - Balance while walking in room without assist - Complete Level 6 (Walks independently in room and hall) - Balance while walking in room without assist - Complete Level 5 (Walks with assist in room/hall) - Balance while stepping forward/back and can walk in room with assist - Complete  ? ?  ?  ? ?  ? ? ? ?Mixed hyperlipidemia ?Continue lipitor ?Apparently has been nonadherent with medicines prior to admission ? ?COPD (chronic obstructive pulmonary disease) (HCC) ?Compensated ?Given Pneumovax immunization this admission ? ?Constipation-resolved as of 11/02/2021 ?No bowel movement for several days ?Nursing states patient has been refusing preventative laxatives-as of 2/23 patient is  agreeing to take stool softeners and laxative ?Give one-time dose of milk of magnesia to promote bowel movement ? ? ? ? ? ? ? ?Subjective:  ?Alert without complaints.  Staff reports he is continuing to request to go outside for cigarette.  Nicotine increased and Xanax adjusted.  See notes. ? ?Physical Exam: ?Vitals:  ? 11/15/21 0039 11/15/21 0413 11/15/21 0415 11/15/21 1129  ?BP: 139/88 122/78  123/62  ?Pulse: 78 61  76  ?Resp:  18 15    ?Temp: 98.6 ?F (37 ?C) 98.3 ?F (36.8 ?C)  98.6 ?F (37 ?C)  ?TempSrc: Oral Oral  Oral  ?SpO2: 97% 95%  97%  ?Weight:   107.7 kg   ?Height:      ? ?Gen: No acute distress, calm  ?Pulm: Posterior lung sounds clea

## 2021-11-15 NOTE — Progress Notes (Signed)
CSW spoke with [place] at [place] who states [facility] has still not approved for a guardianship petition to be filed. There is a meeting planned for Wednesday at [place] with [facility] staff and this CSW will participate. ? ?Madilyn Fireman, MSW, LCSW ?Transitions of Care  Clinical Social Worker II ?[phone] ? ?

## 2021-11-16 NOTE — Progress Notes (Signed)
CSW spoke briefly with [facility] DSS staff regarding patient - attorney to notify CSW with updates as they become available. ? ?[name], MSW, LCSW ?Transitions of Care  Clinical Social Worker II ?[phone] ? ?

## 2021-11-17 MED ORDER — HYDRALAZINE HCL 25 MG PO TABS
25.0000 mg | ORAL_TABLET | Freq: Four times a day (QID) | ORAL | Status: DC | PRN
  Administered 2021-12-16 – 2021-12-19 (×3): 25 mg via ORAL
  Filled 2021-11-17 (×7): qty 1

## 2021-11-17 NOTE — Progress Notes (Signed)
CSW spoke with [place] of DSS who states [facility] is still unwilling to petition for guardianship of the patient due to liability concerns. ? ?CSW spoke with Dr. [name] to discuss patient - MD states patient continues to lack capacity for independent decision making. ? ?CSW spoke with [place] supervisor regarding patient. ? ?CSW spoke with Lolly Mustache of [facility] [employer] regarding patient. ? ?[name], MSW, LCSW ?Transitions of Care  Clinical Social Worker II ?[phone] ? ? ? ?

## 2021-11-18 DIAGNOSIS — E871 Hypo-osmolality and hyponatremia: Secondary | ICD-10-CM | POA: Diagnosis not present

## 2021-11-18 NOTE — Progress Notes (Signed)
RN called by staff d/t strong smell of cigarette smoke in patient room. Pt initially denied smoking but was able to produce a lighter from his pants pocket and later said he found a "duck" in his clothes and smoked it. Pt explained a "duck" is a kind of filter for hand-rolled cigarettes. Ashes noted to be present on toilet seat. Lighter removed from pt room and placed in chart with label . Pt educated on no smoking policy in hospital and safety issues concerning violation of policy. Pt stated he understood and would not smoke in his room again. Pt stated his nicotine patch is still in place and asked to go outside. Pt informed there is no available staff to go outside with him at this time.  ?

## 2021-11-18 NOTE — Progress Notes (Signed)
?Progress Note ? ? ?Patient: Jim Dunn [id number] DOB: 1966/04/05 DOA: 09/24/2021     52 ?DOS: the patient was seen and examined on 11/18/2021 ?  ?Brief hospital course: ?Jim Dunn is a 56 y.o. male with PMH significant for schizophrenia, chronic hyponatremia, seizure disorder, tobacco use, systolic congestive heart failure, COPD who was brought to ED on 09/24/2021 by his family after they were unable to take care of him.  He was drinking a lot of water and trying to burn things. Previously he was staying at group home and was kicked out.   Neurology and psychiatry were consulted.   During hospitalization patient also had seizures.  Hospital course remarkable for acute on chronic hyponatremia, breakthrough seizures.Prolonged hospitalization.  TOC following. APS in process to determine if they will pursue guardianship ?  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  Currently on fluid restriction.  On salt tablet.  Latest sodium of 130. ? ?Elevated CK ?Trended down after IV fluids. thought to be secondary to seizures. ? ?Peripheral neuropathy ?Continue Lyrica. ? ?Dementia without behavioral disturbance  ?History of formal neuropsychiatric evaluation by Dr. Sima Matas who diagnosed patient with dementia likely related to years of schizophrenia and nonadherence to medication regimen.  APS for guardianship at this time. ? ?Paranoid schizophrenia with hallucinations ?Seen by psychiatry during hospitalization, Continue Seroquel and Invega ?Continue Xanax which has previously been well effective for this patient ? ?Seizure disorder  ?History of nonadherence to valproic acid.  EEG was negative.  Currently on Depakote.  MRI revealed arachnoid cyst that is chronic in nature. S/p  grand mal seizure with postictal phase on the evening of 2/26 presumed to be precipitated/provoked by  abrupt hyponatremia with sodium less than 120.  Valproic acid level 58.  No further reports ? ?COPD (chronic obstructive pulmonary disease)  ? compensated at this time.  Continue DuoNebs ? ?Mixed hyperlipidemia ?Continue lipitor ? ?Chronic systolic CHF (congestive heart failure) ([place]) ?Continue digoxin and Entresto, I/O and daily weights, fluid restriction due to hyponatremia ?DVT echocardiogram performed on 09/26/2021 EF of 30-35% with moderately decreased systolic function and global hypokinesis.  ? ?Constipation-resolved as of 11/02/2021 ? ?Subjective:  ?Seen and examined.  He has no complaints. ? ?Physical Exam: ?Vitals:  ? 11/17/21 2341 11/18/21 0346 11/18/21 0844 11/18/21 1129  ?BP: 113/65 126/64 (!) 157/87 (!) 154/94  ?Pulse: 81 67 74 75  ?Resp: 16 14 17 18   ?Temp: 99.3 ?F (37.4 ?C) 98.5 ?F (36.9 ?C) 97.6 ?F (36.4 ?C) 98.5 ?F (36.9 ?C)  ?TempSrc: Oral Oral Oral Oral  ?SpO2: 97% 95% 100% 100%  ?Weight:      ?Height:      ? ?General exam: Appears calm and comfortable  ?Respiratory system: Clear to auscultation. Respiratory effort normal. ?Cardiovascular system: S1 & S2 heard, RRR. No JVD, murmurs, rubs, gallops or clicks. No pedal edema. ?Gastrointestinal system: Abdomen is nondistended, soft and nontender. No organomegaly or masses felt. Normal bowel sounds heard. ?Central nervous system: Alert and oriented. No focal neurological deficits. ?Extremities: Symmetric 5 x 5 power. ?Skin: No rashes, lesions or ulcers.  ?Psychiatry: Judgement and insight appear poor ? ?Data Reviewed: ? ?Results reviewed ? ?Family Communication:  ?Patient at bedside ? ?Disposition: ?Remains inpatient appropriate because:  Unsafe DC plan ? ?Planned Discharge Destination:  ?Barriers to discharge: APS to determine if they will pursue guardianship. Family refusing to take pt because stating they cannot manage  him noting refuses to take meds TOC is working with [place] care coordination to develop a discharge plan for possible group home  placement ? ?Medically stable ?Yes ? ?Consultants: ?Psychiatry ?Neurology ? ?Procedures: ?EEG ?Echocardiogram ? ?Antibiotics: ?None ? ? ?Author: ?Darliss Cheney, MD ?11/18/2021 12:41 PM ? ?For on call review [email or web address].  ? ?

## 2021-11-19 DIAGNOSIS — E871 Hypo-osmolality and hyponatremia: Secondary | ICD-10-CM | POA: Diagnosis not present

## 2021-11-19 LAB — BASIC METABOLIC PANEL
Anion gap: 9 (ref 5–15)
BUN: 7 mg/dL (ref 6–20)
CO2: 25 mmol/L (ref 22–32)
Calcium: 8.8 mg/dL — ABNORMAL LOW (ref 8.9–10.3)
Chloride: 98 mmol/L (ref 98–111)
Creatinine, Ser: 0.68 mg/dL (ref 0.61–1.24)
GFR, Estimated: >60 mL/min (ref >60)
Glucose, Bld: 102 mg/dL — ABNORMAL HIGH (ref 70–99)
Potassium: 3.8 mmol/L (ref 3.5–5.1)
Sodium: 132 mmol/L — ABNORMAL LOW (ref 135–145)

## 2021-11-19 MED ORDER — CARVEDILOL 6.25 MG PO TABS
6.2500 mg | ORAL_TABLET | Freq: Two times a day (BID) | ORAL | Status: DC
  Administered 2021-11-19 – 2021-11-26 (×15): 6.25 mg via ORAL
  Filled 2021-11-19 (×15): qty 1

## 2021-11-19 NOTE — Progress Notes (Signed)
?Progress Note ? ? ?Patient: Jim Dunn [id number]:[id number] DOB: [birth year] DOA: 09/24/2021     53 ?DOS: the patient was seen and examined on 11/19/2021 ?  ?Brief hospital course: ?Jim Dunn is a 56 y.o. male with PMH significant for schizophrenia, chronic hyponatremia, seizure disorder, tobacco use, systolic congestive heart failure, COPD who was brought to ED on 09/24/2021 by his family after they were unable to take care of him.  He was drinking a lot of water and trying to burn things. Previously he was staying at group home and was kicked out.   Neurology and psychiatry were consulted.   During hospitalization patient also had seizures.  Hospital course remarkable for acute on chronic hyponatremia, breakthrough seizures.Prolonged hospitalization.  TOC following. APS in process to determine if they will pursue guardianship ?  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  Currently on fluid restriction.  On salt tablet.  Latest sodium of 130. ? ?Elevated CK ?Trended down after IV fluids. thought to be secondary to seizures. ? ?Peripheral neuropathy ?Continue Lyrica. ? ?Dementia without behavioral disturbance  ?History of formal neuropsychiatric evaluation by Dr. Kieth Brightly who diagnosed patient with dementia likely related to years of schizophrenia and nonadherence to medication regimen.  APS for guardianship at this time. ? ?Paranoid schizophrenia with hallucinations ?Seen by psychiatry during hospitalization, Continue Seroquel and Invega ?Continue Xanax which has previously been well effective for this patient ? ?Seizure disorder  ?History of nonadherence to valproic acid.  EEG was negative.  Currently on Depakote.  MRI revealed arachnoid cyst that is chronic in nature. S/p  grand mal seizure with postictal phase on the evening of 2/26 presumed to be precipitated/provoked by  abrupt hyponatremia with sodium less than 120.  Valproic acid level 58.  No further reports ? ?COPD (chronic obstructive pulmonary disease)  ? compensated at this time.  Continue DuoNebs ? ?Mixed hyperlipidemia ?Continue lipitor ? ?Chronic systolic CHF (congestive heart failure) (HCC) ?Continue digoxin and Entresto, I/O and daily weights, fluid restriction due to hyponatremia ?DVT echocardiogram performed on 09/26/2021 EF of 30-35% with moderately decreased systolic function and global hypokinesis.  ? ?Hypertension: Blood pressure elevated.  Will increase Coreg to 6.25 mg twice daily. ? ?Subjective:  ?Seen and examined.  No complaints. ? ?Physical Exam: ?Vitals:  ? 11/18/21 2312 11/19/21 0415 11/19/21 0427 11/19/21 0749  ?BP: 140/72 (!) 152/88  (!) 167/98  ?Pulse: 79 76  73  ?Resp: 18 18  16   ?Temp: 98 ?F (36.7 ?C) 98.2 ?F (36.8 ?C)    ?TempSrc: Oral Axillary    ?SpO2: 100% 100%  99%  ?Weight:   110.9 kg   ?Height:      ? ?General exam: Appears calm and comfortable  ?Respiratory system: Clear to auscultation. Respiratory effort normal. ?Cardiovascular system: S1 & S2 heard, RRR. No JVD, murmurs, rubs, gallops or clicks. No pedal edema. ?Gastrointestinal system: Abdomen is nondistended, soft and nontender. No organomegaly or masses felt. Normal bowel sounds heard. ?Central nervous system: Alert and oriented. No focal neurological deficits. ?Extremities: Symmetric 5 x 5 power. ?Skin: No rashes, lesions or ulcers.  ?Psychiatry: Judgement and insight appear poor ? ?Data Reviewed: ? ?Results reviewed ? ?Family Communication:  ?Patient at bedside ? ?Disposition: ?Remains inpatient appropriate because:  Unsafe DC plan ? ?Planned Discharge Destination:  ?Barriers to discharge: APS to determine if they will pursue guardianship. Family refusing to take pt because stating they  cannot manage him noting refuses to take meds TOC is working with [facility] coordination to develop a discharge plan for possible group home  placement ? ?Medically stable ?Yes ? ?Consultants: ?Psychiatry ?Neurology ? ?Procedures: ?EEG ?Echocardiogram ? ?Antibiotics: ?None ? ? ?Author: ?Hughie Closs, MD ?11/19/2021 10:31 AM ? ?For on call review [email or web address]  ? ?

## 2021-11-19 NOTE — Plan of Care (Signed)
?  Problem: Education: Goal: Knowledge of General Education information will improve Description: Including pain rating scale, medication(s)/side effects and non-pharmacologic comfort measures Outcome: Progressing   Problem: Health Behavior/Discharge Planning: Goal: Ability to manage health-related needs will improve Outcome: Progressing   Problem: Clinical Measurements: Goal: Ability to maintain clinical measurements within normal limits will improve Outcome: Progressing   Problem: Education: Goal: Knowledge of General Education information will improve Description: Including pain rating scale, medication(s)/side effects and non-pharmacologic comfort measures Outcome: Progressing   Problem: Health Behavior/Discharge Planning: Goal: Ability to manage health-related needs will improve Outcome: Progressing   Problem: Clinical Measurements: Goal: Ability to maintain clinical measurements within normal limits will improve Outcome: Progressing Goal: Will remain free from infection Outcome: Progressing Goal: Diagnostic test results will improve Outcome: Progressing Goal: Respiratory complications will improve Outcome: Progressing Goal: Cardiovascular complication will be avoided Outcome: Progressing   Problem: Activity: Goal: Risk for activity intolerance will decrease Outcome: Progressing   Problem: Nutrition: Goal: Adequate nutrition will be maintained Outcome: Progressing   Problem: Coping: Goal: Level of anxiety will decrease Outcome: Progressing   Problem: Elimination: Goal: Will not experience complications related to bowel motility Outcome: Progressing Goal: Will not experience complications related to urinary retention Outcome: Progressing   Problem: Pain Managment: Goal: General experience of comfort will improve Outcome: Progressing   Problem: Safety: Goal: Ability to remain free from injury will improve Outcome: Progressing   Problem: Skin  Integrity: Goal: Risk for impaired skin integrity will decrease Outcome: Progressing   

## 2021-11-20 DIAGNOSIS — E871 Hypo-osmolality and hyponatremia: Secondary | ICD-10-CM | POA: Diagnosis not present

## 2021-11-20 MED ORDER — ALPRAZOLAM 0.25 MG PO TABS
0.2500 mg | ORAL_TABLET | Freq: Once | ORAL | Status: AC
  Administered 2021-11-20: 0.25 mg via ORAL
  Filled 2021-11-20: qty 1

## 2021-11-20 NOTE — Progress Notes (Signed)
?Progress Note ? ? ?Patient: Jim Dunn [id number] DOB: April 11, 1966 DOA: 09/24/2021     54 ?DOS: the patient was seen and examined on 11/20/2021 ?  ?Brief hospital course: ?Jim Dunn is a 56 y.o. male with PMH significant for schizophrenia, chronic hyponatremia, seizure disorder, tobacco use, systolic congestive heart failure, COPD who was brought to ED on 09/24/2021 by his family after they were unable to take care of him.  He was drinking a lot of water and trying to burn things. Previously he was staying at group home and was kicked out.   Neurology and psychiatry were consulted.   During hospitalization patient also had seizures.  Hospital course remarkable for acute on chronic hyponatremia, breakthrough seizures.Prolonged hospitalization.  TOC following. APS in process to determine if they will pursue guardianship ?  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  Currently on fluid restriction.  On salt tablet.  Latest sodium of 130. ? ?Elevated CK ?Trended down after IV fluids. thought to be secondary to seizures. ? ?Peripheral neuropathy ?Continue Lyrica. ? ?Dementia without behavioral disturbance  ?History of formal neuropsychiatric evaluation by Dr. Kieth Brightly who diagnosed patient with dementia likely related to years of schizophrenia and nonadherence to medication regimen.  APS for guardianship at this time. ? ?Paranoid schizophrenia with hallucinations ?Seen by psychiatry during hospitalization, Continue Seroquel and Invega ?Continue Xanax which has previously been well effective for this patient ? ?Seizure disorder  ?History of nonadherence to valproic acid.  EEG was negative.  Currently on Depakote.  MRI revealed arachnoid cyst that is chronic in nature. S/p  grand mal seizure with postictal phase on the evening of 2/26 presumed to be precipitated/provoked by  abrupt hyponatremia with sodium less than 120.  Valproic acid level 58.  No further reports ? ?COPD (chronic obstructive pulmonary disease)  ? compensated at this time.  Continue DuoNebs ? ?Mixed hyperlipidemia ?Continue lipitor ? ?Chronic systolic CHF (congestive heart failure) (HCC) ?Continue digoxin and Entresto, I/O and daily weights, fluid restriction due to hyponatremia ?DVT echocardiogram performed on 09/26/2021 EF of 30-35% with moderately decreased systolic function and global hypokinesis.  ? ?Hypertension: Blood pressure fairly controlled on increased dose of Coreg 6.25 mg twice daily. ? ?Subjective:  ?Patient seen and examined.  No complaints. ? ?Physical Exam: ?Vitals:  ? 11/19/21 1528 11/19/21 2054 11/20/21 0019 11/20/21 0425  ?BP: (!) 157/98 (!) 169/83 (!) 135/96 (!) 147/75  ?Pulse: 88 73 69 65  ?Resp: 18 19 16 16   ?Temp: 98.4 ?F (36.9 ?C) 97.7 ?F (36.5 ?C) (!) 97.5 ?F (36.4 ?C) 97.8 ?F (36.6 ?C)  ?TempSrc: Oral Oral Oral Oral  ?SpO2: 99% 94% 98% 99%  ?Weight:      ?Height:      ? ?General exam: Appears calm and comfortable  ?Respiratory system: Clear to auscultation. Respiratory effort normal. ?Cardiovascular system: S1 & S2 heard, RRR. No JVD, murmurs, rubs, gallops or clicks. No pedal edema. ?Gastrointestinal system: Abdomen is nondistended, soft and nontender. No organomegaly or masses felt. Normal bowel sounds heard. ?Central nervous system: Alert and oriented. No focal neurological deficits. ?Extremities: Symmetric 5 x 5 power. ?Skin: No rashes, lesions or ulcers.  ?Psychiatry: Judgement and insight appear poor ? ?Data Reviewed: ? ?Results reviewed ? ?Family Communication:  ?Patient at bedside ? ?Disposition: ?Remains inpatient appropriate because:  Unsafe DC plan ? ?Planned Discharge Destination:  ?Barriers to discharge: APS to determine if they will  pursue guardianship. Family refusing to take pt because stating they cannot manage him noting refuses to take meds TOC is working with [facility]  care coordination to develop a discharge plan for possible group home placement ? ?Medically stable ?Yes ? ?Consultants: ?Psychiatry ?Neurology ? ?Procedures: ?EEG ?Echocardiogram ? ?Antibiotics: ?None ? ? ?Author: ?Hughie Closs, MD ?11/20/2021 10:04 AM ? ?For on call review [email or web address]  ? ?

## 2021-11-21 DIAGNOSIS — E871 Hypo-osmolality and hyponatremia: Secondary | ICD-10-CM | POA: Diagnosis not present

## 2021-11-21 NOTE — Progress Notes (Signed)
?Progress Note ? ? ?Patient: Jim Dunn [id number]:[id number] DOB: 01-24-1966 DOA: 09/24/2021     55 ?DOS: the patient was seen and examined on 11/21/2021 ?  ?Brief hospital course: ?Jim Dunn is a 56 y.o. male with PMH significant for schizophrenia, chronic hyponatremia, seizure disorder, tobacco use, systolic congestive heart failure, COPD who was brought to ED on 09/24/2021 by his family after they were unable to take care of him.  He was drinking a lot of water and trying to burn things. Previously he was staying at group home and was kicked out.   ?Neurology and psychiatry were consulted.   ?Hospital course remarkable for acute on chronic hyponatremia, breakthrough seizures. ?Prolonged hospitalization.   ?TOC following.  ?APS in process to determine if they will pursue guardianship ?  ? ?Patient's behaviors have been stable.  Process was put in place to allow patient to briefly leave the floor given long length of stay.  Criteria were as follows: Must notify the nurse when he was leaving the floor, would not be allowed to leave the floor for longer than 30 minutes and he was not allowed to leave hospital grounds.  If any 1 of these criteria were broken off unit privileges would be revoked.  It is noted that eventual discharge plan would be to a group home although 24/7 monitoring of fluid intake would be difficult in that setting. ? ?On the evening of 2/26 patient developed a grand mal seizure and it was noted his sodium had decreased significantly to 117.  It was felt that patient had been drinking fluids in his room and off the unit leading to hypervolemic hyponatremia.  He has a history of polydipsia and has had seizures related to this in the past.  He was placed on hypertonic saline and transferred to the ICU service.  Of note patient had been on Lasix, total fluid restriction versus only free water restriction and salt tablets.  As of now Lasix has been discontinued.  Fluid restriction pertains to only free water  and prior to ICU team assuming care orders changed to allow pt to consume Gatorade and other electrolyte-based fluids to help with excessive thirst. ? ? ? ? 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  ?2/26 had hyponatremic related seizure with nadir all sodium 116.  Current sodium stable ?Continue to hold Lasix.  Restriction will focus on free water only (including coffee).  Patient is allowed electrolyte-based fluids such as Gatorade as well as juices and milk that do not count towards the fluid restriction ?Patient had initial CK after seizure greater than 3000 which has now normalized ? ? ? ?Seizure disorder (HCC) ?Continue Depakote-valproic acid level 41 previous level on 2/10 was 0.2 ?History of nonadherence and valproic acid on 1/22 was 19 ?EEG negative for acute seizure activity ?Continue seizure precautions ?Depakote can also function as a mood stabilizer ?MRI revealed arachnoid cyst that is chronic in nature ?S/p  grand mal seizure with postictal phase on the evening of 2/26 presumed to be precipitated/provoked by abrupt hyponatremia with sodium less than 120.  Valproic acid level 58 ? ?Elevated CK ?Secondary to recent seizure activity as well as volume depletion ?Encourage oral intake of electrolyte based fluids ?CK trend is downward.  2298 > 1926 > 1616 > 970 and now  is less than 400 ? ?Paranoid schizophrenia with hallucinations ?Appreciate psychiatric team help ?Schizophrenia symptoms and decompensation worsened by recurrent hyponatremia ?Also has a component of anxiety and during previous admission requested antianxiety medications ?Continue Seroquel and Invega ?Continue Xanax but increased from 0.25 to 0.5 mg since this will also help with tobacco  cravings ?Have allowed IVC to expire since no longer needed ?Patient is very appreciative of LCSW [name] assistance ? ?Chronic systolic CHF (congestive heart failure) (HCC) ?Continue digoxin and Entresto  ?Lasix discontinued during recent episode of acute hyponatremia with seizure activity as well as hypotension we will continue to hold until CK normalized ?Digoxin level subtherapeutic as of 2/10 but this also appears to be a chronic issue.  Follow periodically ?Strict I/O and daily weights ?Fluid restriction for chronic hyponatremia for free water primarily-allowing gatorade and other electrolyte based fluids to not be included in FR ?Last echo: 5/21 with EF of 25-30% with global hypokinesis, RV overload and indeterminate diastolic dysfunction. Does not appear that he has followed up with cardiology. Gets meds from ED.  ?Echocardiogram performed on 09/26/2021 EF of 30-35% with moderately decreased systolic function and global hypokinesis. RV size and systolic function is normal. ?Weight measurements have been inconsistent and appear to be inaccurate ? ?Dementia without behavioral disturbance (HCC) ?During initial admission over 1 year ago had formal neuropsychiatric evaluation by Dr. Kieth Brightly who diagnosed patient with dementia likely related to years of schizophrenia and nonadherence to medication regimen ?SLUMS evaluation worsened since previous admission noting has declined from 18 to 12.  He lacks capacity to make decisions ?APS investigating guardianship noting no other family members or friends are willing to pursue guardianship ?Had previously initiated Vistaril for patient's anxiety issues.  Not effective.  During previous hospitalization I used Xanax 0.25 mg BID and this was more effective with the patient therefore I have transition to Xanax as of 3/1 ? ? ? ? ?Peripheral neuropathy ?Chronic issue and was present during previous admission ?Likely related to years of substance abuse ?Continue  low-dose Lyrica since was effective previous ? ?A physical therapy consult is indicated based on the patient?s mobility assessment. ? ? ?Mobility Assessment (last 72 hours)   ? ? Mobility Assessment   ? ? Row Name 11/01/21 0730 10/31/21 0730 10/30/21 1128 10/29/21 2100 10/29/21 0808  ? Does patient have an order for bedrest or is patient medically unstable No - Continue assessment No - Continue assessment No - Continue assessment No - Continue assessment No - Continue assessment  ? What is the highest level of mobility based on the progressive mobility assessment? Level 6 (Walks independently in room and hall) - Balance while walking in room without assist - Complete Level 6 (Walks independently in room and hall) - Balance while walking in room without assist - Complete Level 6 (Walks independently in room and hall) - Balance while walking in room without assist - Complete Level 6 (Walks independently in room and hall) - Balance while walking in room without assist - Complete Level 5 (Walks with assist in room/hall) - Balance while stepping forward/back and can walk in room with assist - Complete  ? ?  ?  ? ?  ? ? ? ?Mixed hyperlipidemia ?Continue lipitor ?Apparently has been nonadherent with medicines prior to admission ? ?COPD (chronic obstructive pulmonary disease) (HCC) ?Compensated ?Given Pneumovax immunization this admission ? ?Constipation-resolved as of 11/02/2021 ?No bowel movement for several days ?Nursing states patient has been refusing preventative laxatives-as of 2/23 patient is  agreeing to take stool softeners and laxative ?Give one-time dose of milk of magnesia to promote bowel movement ? ? ? ? ? ? ? ?Subjective:  ?Alert without any specific complaints.  Of note he has been moved to a different room which is the room he was in during previous prolonged hospitalization. ? ?Physical Exam: ?Vitals:  ? 11/20/21 1639 11/20/21 2053 11/20/21 2358 11/21/21 0426  ?BP: (!) 158/91 (!) 154/74 (!) 108/56 127/79   ?Pulse: 81 76 73 61  ?Resp: 16 18 14 18   ?Temp: 97.8 ?F (36.6 ?C) 99 ?F (37.2 ?C) 98.5 ?F (36.9 ?C) 97.7 ?F (36.5 ?C)  ?TempSrc: Oral Oral Oral Oral  ?SpO2: 97% 96% 100% 100%  ?Weight:      ?Height:      ? ?Gen: No acut

## 2021-11-21 NOTE — Progress Notes (Addendum)
CSW spoke with Rennis Harding of [facility] Legal team who states he will look into this patient and return call to CSW. ? ?[name], MSW, LCSW ?Transitions of Care  Clinical Social Worker II ?[phone] ? ?

## 2021-11-22 DIAGNOSIS — E871 Hypo-osmolality and hyponatremia: Secondary | ICD-10-CM | POA: Diagnosis not present

## 2021-11-22 MED ORDER — NICOTINE 14 MG/24HR TD PT24
14.0000 mg | MEDICATED_PATCH | Freq: Every day | TRANSDERMAL | Status: DC
  Administered 2021-11-23 – 2022-01-13 (×51): 14 mg via TRANSDERMAL
  Filled 2021-11-22 (×53): qty 1

## 2021-11-22 NOTE — Progress Notes (Signed)
CSW spoke with patient at bedside to provide him with an update regarding DSS involvement. CSW explained to patient how he needs to remain complaint with treatment and remaining in the hospital until a plan can be created for a safe discharge plan - he stated understanding and agreement. CSW answered all patient's questions and encouraged him to reach out if further questions arise. ? ?Jim Dunn, MSW, LCSW ?Transitions of Care  Clinical Social Worker II ?[phone] ? ?

## 2021-11-22 NOTE — Progress Notes (Signed)
?Progress Note ? ? ?Patient: Jim Dunn [id number]:[id number] DOB: 17-Dec-1965 DOA: 09/24/2021     56 ?DOS: the patient was seen and examined on 11/22/2021 ?  ?Brief hospital course: ?Jim Dunn is a 56 y.o. male with PMH significant for schizophrenia, chronic hyponatremia, seizure disorder, tobacco use, systolic congestive heart failure, COPD who was brought to ED on 09/24/2021 by his family after they were unable to take care of him.  He was drinking a lot of water and trying to burn things. Previously he was staying at group home and was kicked out.   Neurology and psychiatry were consulted.   During hospitalization patient also had seizures.  Hospital course remarkable for acute on chronic hyponatremia, breakthrough seizures.Prolonged hospitalization.  TOC following. APS in process to determine if they will pursue guardianship ?  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  Currently on fluid restriction.  On salt tablet.  Latest sodium of 130. ? ?Elevated CK ?Trended down after IV fluids. thought to be secondary to seizures. ? ?Peripheral neuropathy ?Continue Lyrica. ? ?Dementia without behavioral disturbance  ?History of formal neuropsychiatric evaluation by Dr. Kieth Brightly who diagnosed patient with dementia likely related to years of schizophrenia and nonadherence to medication regimen.  APS for guardianship at this time. ? ?Paranoid schizophrenia with hallucinations ?Seen by psychiatry during hospitalization, Continue Seroquel and Invega ?Continue Xanax which has previously been well effective for this patient ? ?Seizure disorder  ?History of nonadherence to valproic acid.  EEG was negative.  Currently on Depakote.  MRI revealed arachnoid cyst that is chronic in nature. S/p  grand mal seizure with postictal phase on the evening of 2/26 presumed to be precipitated/provoked by  abrupt hyponatremia with sodium less than 120.  Valproic acid level 58.  No further reports ? ?COPD (chronic obstructive pulmonary disease)  ? compensated at this time.  Continue DuoNebs ? ?Mixed hyperlipidemia ?Continue lipitor ? ?Chronic systolic CHF (congestive heart failure) (HCC) ?Continue digoxin and Entresto, I/O and daily weights, fluid restriction due to hyponatremia ?DVT echocardiogram performed on 09/26/2021 EF of 30-35% with moderately decreased systolic function and global hypokinesis.  ? ?Hypertension: Blood pressure fairly controlled on increased dose of Coreg 6.25 mg twice daily. ? ?Nicotine dependence: Since he has been hospitalized, patient tends to go down and smoke despite of trying to educate him and prohibiting him from doing that.  At multiple times, cigarettes as well as lighters have been confiscated from him.  I had a very frank and lengthy discussion with him today.  He is craving for smoking.  We discussed about starting him on nicotine patch and he agreed for that. ? ?Subjective:  ?Seen and examined.  No complaints. ? ?Physical Exam: ?Vitals:  ? 11/21/21 1602 11/21/21 1945 11/21/21 2327 11/22/21 0334  ?BP: (!) 155/73 (!) 159/89 117/65 109/69  ?Pulse: 81 89 79 69  ?Resp: 20 17 18 17   ?Temp:  97.9 ?F (36.6 ?C) 97.9 ?F (36.6 ?C) 98 ?F (36.7 ?C)  ?TempSrc:      ?SpO2: 100% 98% 96%   ?Weight:      ?Height:      ? ?General exam: Appears calm and comfortable  ?Respiratory system: Clear to auscultation. Respiratory effort normal. ?Cardiovascular system: S1 & S2 heard, RRR. No JVD, murmurs, rubs, gallops or clicks. No pedal edema. ?Gastrointestinal system: Abdomen is nondistended, soft and nontender. No organomegaly or masses felt. Normal bowel sounds heard. ?  Central nervous system: Alert and oriented. No focal neurological deficits. ?Extremities: Symmetric 5 x 5 power. ?Skin: No rashes, lesions or ulcers.  ?Psychiatry: Judgement and insight appear poor ? ? ?Data Reviewed: ? ?Results  reviewed ? ?Family Communication:  ?Patient at bedside ? ?Disposition: ?Remains inpatient appropriate because:  Unsafe DC plan ? ?Planned Discharge Destination:  ?Barriers to discharge: APS to determine if they will pursue guardianship. Family refusing to take pt because stating they cannot manage him noting refuses to take meds TOC is working with [facility] care coordination to develop a discharge plan for possible group home placement ? ?Medically stable ?Yes ? ?Consultants: ?Psychiatry ?Neurology ? ?Procedures: ?EEG ?Echocardiogram ? ?Antibiotics: ?None ? ? ?Author: ?Hughie Closs, MD ?11/22/2021 10:54 AM ? ?For on call review [email or web address]  ? ?

## 2021-11-23 DIAGNOSIS — E871 Hypo-osmolality and hyponatremia: Secondary | ICD-10-CM | POA: Diagnosis not present
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
## 2021-11-23 NOTE — Progress Notes (Signed)
CSW spoke with Rennis Harding of [employer] to provide him with information needed to file the petition for guardianship of this patient. ? ?[name], MSW, LCSW ?Transitions of Care  Clinical Social Worker II ?[phone] ? ?

## 2021-11-23 NOTE — Progress Notes (Signed)
?Progress Note ? ? ?Patient: Jim Dunn ZOX:[id number] DOB: 12/01/1965 DOA: 09/24/2021     57 ?DOS: the patient was seen and examined on 11/23/2021 ?  ?Brief hospital course: ?Jim Dunn is a 56 y.o. male with PMH significant for schizophrenia, chronic hyponatremia, seizure disorder, tobacco use, systolic congestive heart failure, COPD who was brought to ED on 09/24/2021 by his family after they were unable to take care of him.  He was drinking a lot of water and trying to burn things. Previously he was staying at group home and was kicked out.   ?Neurology and psychiatry were consulted.   ?Hospital course remarkable for acute on chronic hyponatremia, breakthrough seizures. ?Prolonged hospitalization.   ?TOC following.  ?APS in process to determine if they will pursue guardianship ?  ? ?Patient's behaviors have been stable.  Process was put in place to allow patient to briefly leave the floor given long length of stay.  Criteria were as follows: Must notify the nurse when he was leaving the floor, would not be allowed to leave the floor for longer than 30 minutes and he was not allowed to leave hospital grounds.  If any 1 of these criteria were broken off unit privileges would be revoked.  It is noted that eventual discharge plan would be to a group home although 24/7 monitoring of fluid intake would be difficult in that setting. ? ?On the evening of 2/26 patient developed a grand mal seizure and it was noted his sodium had decreased significantly to 117.  It was felt that patient had been drinking fluids in his room and off the unit leading to hypervolemic hyponatremia.  He has a history of polydipsia and has had seizures related to this in the past.  He was placed on hypertonic saline and transferred to the ICU service.  Of note patient had been on Lasix, total fluid restriction versus only free water restriction and salt tablets.  As of now Lasix has been discontinued.  Fluid restriction pertains to only free water  and prior to ICU team assuming care orders changed to allow pt to consume Gatorade and other electrolyte-based fluids to help with excessive thirst. ? ? ? ? 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  ?2/26 had hyponatremic related seizure with nadir all sodium 116.  Current sodium stable ?Continue to hold Lasix.  Restriction will focus on free water only (including coffee).  Patient is allowed electrolyte-based fluids such as Gatorade as well as juices and milk that do not count towards the fluid restriction ?Patient had initial CK after seizure greater than 3000 which has now normalized ? ?@@Please  do not dc his xanax-this drug has been extremely helpful in the management of his psychogenic polydipsia both during this admission and during the previuos admission when I cared for him@@ ? ? ? ?Seizure disorder (HCC) ?Continue Depakote-valproic acid level 41 previous level on 2/10 was 0.2 ?History of nonadherence and valproic acid on 1/22 was 19 ?EEG negative for acute seizure activity ?Continue seizure precautions ?Depakote can also function as a mood stabilizer ?MRI revealed arachnoid cyst that is chronic in nature ?S/p  grand mal seizure with postictal phase on the evening of 2/26 presumed to be precipitated/provoked by abrupt hyponatremia with sodium less than 120.  Valproic acid level 58 ? ?  Elevated CK ?Secondary to recent seizure activity as well as volume depletion ?Encourage oral intake of electrolyte based fluids ?CK trend is downward.  2298 > 1926 > 1616 > 970 and now is less than 400 ? ?Paranoid schizophrenia with hallucinations ?Appreciate psychiatric team help-on 3/22 they are reevaluating patient ?Schizophrenia symptoms and decompensation worsened by recurrent  hyponatremia ?Also has a component of anxiety and during previous admission requested antianxiety medications ?Continue Seroquel and Invega ?Continue Xanax but increased from 0.25 to 0.5 mg since this will also help with tobacco cravings ?Have allowed IVC to expire since no longer needed ?Patient is very appreciative of LCSW Madilyn Fireman assistance ? ?Chronic systolic CHF (congestive heart failure) ([place]) ?Continue digoxin and Entresto  ?Lasix discontinued during recent episode of acute hyponatremia with seizure activity as well as hypotension we will continue to hold until CK normalized ?Digoxin level subtherapeutic as of 2/10 but this also appears to be a chronic issue.  Follow periodically ?Strict I/O and daily weights ?Fluid restriction for chronic hyponatremia for free water primarily-allowing gatorade and other electrolyte based fluids to not be included in FR ?Last echo: 5/21 with EF of 25-30% with global hypokinesis, RV overload and indeterminate diastolic dysfunction. Does not appear that he has followed up with cardiology. Gets meds from ED.  ?Echocardiogram performed on 09/26/2021 EF of 30-35% with moderately decreased systolic function and global hypokinesis. RV size and systolic function is normal. ?Weight measurements have been inconsistent and appear to be inaccurate ? ?Dementia without behavioral disturbance ([place]) ?During initial admission over 1 year ago had formal neuropsychiatric evaluation by Dr. Sima Matas who diagnosed patient with dementia likely related to years of schizophrenia and nonadherence to medication regimen ?SLUMS evaluation worsened since previous admission noting has declined from 18 to 12.  He lacks capacity to make decisions ?APS investigating guardianship noting no other family members or friends are willing to pursue guardianship ?Had previously initiated Vistaril for patient's anxiety issues.  Not effective.  During previous hospitalization I used Xanax 0.25 mg BID and this was  more effective with the patient therefore I have transition to Xanax as of 3/1 ? ? ? ? ?Peripheral neuropathy ?Chronic issue and was present during previous admission ?Likely related to years of substance abuse ?Continue low-dose Lyrica since was effective previous ? ?A physical therapy consult is indicated based on the patient?s mobility assessment. ? ? ?Mobility Assessment (last 72 hours)   ? ? Mobility Assessment   ? ? [place] Name 11/01/21 0730 10/31/21 0730 10/30/21 1128 10/29/21 2100 10/29/21 0808  ? Does patient have an order for bedrest or is patient medically unstable No - Continue assessment No - Continue assessment No - Continue assessment No - Continue assessment No - Continue assessment  ? What is the highest level of mobility based on the progressive mobility assessment? Level 6 (Walks independently in room and hall) - Balance while walking in room without assist - Complete Level 6 (Walks independently in room and hall) - Balance while walking in room without assist - Complete Level 6 (Walks independently in room and hall) - Balance while walking in room without assist - Complete Level 6 (Walks independently in room and hall) - Balance while walking in room without assist - Complete Level 5 (Walks with assist in room/hall) - Balance while stepping forward/back and can walk in room with assist - Complete  ? ?  ?  ? ?  ? ? ? ?Mixed hyperlipidemia ?Continue lipitor ?Apparently has been nonadherent with medicines prior  to admission ? ?COPD (chronic obstructive pulmonary disease) (HCC) ?Compensated ?Given Pneumovax immunization this admission ? ?Constipation-resolved as of 11/02/2021 ?No bowel movement for several days ?Nursing states patient has been refusing preventative laxatives-as of 2/23 patient is agreeing to take stool softeners and laxative ?Give one-time dose of milk of magnesia to promote bowel movement ? ? ? ? ? ? ? ?Subjective:  ?Laying in bed listening to [employer].  No specific complaints or  requests. ? ?Physical Exam: ?Vitals:  ? 11/22/21 2330 11/23/21 0322 11/23/21 0400 11/23/21 0839  ?BP: (!) 145/82 128/76  (!) 139/102  ?Pulse: 82 66  74  ?Resp: 18 18  16   ?Temp: 98 ?F (36.7 ?C) 97.9 ?F

## 2021-11-24 DIAGNOSIS — E871 Hypo-osmolality and hyponatremia: Secondary | ICD-10-CM | POA: Diagnosis not present

## 2021-11-24 NOTE — Progress Notes (Signed)
CSW spoke with patient's sister Virgilio Belling to provide her with an update regarding legal matters. ? ?CSW completed documents and submitted them to Lissa Merlin of [employer] to file a petition for guardianship. ? ?Madilyn Fireman, MSW, LCSW ?Transitions of Care  Clinical Social Worker II ?[phone] ? ?

## 2021-11-24 NOTE — Progress Notes (Signed)
?Progress Note ? ? ?Patient: Jim Dunn [id number] DOB: [birth year] DOA: 09/24/2021     58 ?DOS: the patient was seen and examined on 11/24/2021 ?  ?Brief hospital course: ?Jim Dunn is a 56 y.o. male with PMH significant for schizophrenia, chronic hyponatremia, seizure disorder, tobacco use, systolic congestive heart failure, COPD who was brought to ED on 09/24/2021 by his family after they were unable to take care of him.  He was drinking a lot of water and trying to burn things. Previously he was staying at group home and was kicked out.   ?Neurology and psychiatry were consulted.   ?Hospital course remarkable for acute on chronic hyponatremia, breakthrough seizures. ?Prolonged hospitalization.   ?TOC following.  ?APS in process to determine if they will pursue guardianship ?  ? ?Patient's behaviors have been stable.  Process was put in place to allow patient to briefly leave the floor given long length of stay.  Criteria were as follows: Must notify the nurse when he was leaving the floor, would not be allowed to leave the floor for longer than 30 minutes and he was not allowed to leave hospital grounds.  If any 1 of these criteria were broken off unit privileges would be revoked.  It is noted that eventual discharge plan would be to a group home although 24/7 monitoring of fluid intake would be difficult in that setting. ? ?On the evening of 2/26 patient developed a grand mal seizure and it was noted his sodium had decreased significantly to 117.  It was felt that patient had been drinking fluids in his room and off the unit leading to hypervolemic hyponatremia.  He has a history of polydipsia and has had seizures related to this in the past.  He was placed on hypertonic saline and transferred to the ICU service.  Of note patient had been on Lasix, total fluid restriction versus only free water restriction and salt tablets.  As of now Lasix has been discontinued.  Fluid restriction pertains to only free water  and prior to ICU team assuming care orders changed to allow pt to consume Gatorade and other electrolyte-based fluids to help with excessive thirst. ? ? ? ? 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  ?2/26 had hyponatremic related seizure with nadir all sodium 116.  Current sodium stable ?Continue to hold Lasix.  Restriction will focus on free water only (including coffee).  Patient is allowed electrolyte-based fluids such as Gatorade as well as juices and milk that do not count towards the fluid restriction ?Patient had initial CK after seizure greater than 3000 which has now normalized ? ?@@Please  do not dc his xanax-this drug has been extremely helpful in the management of his psychogenic polydipsia both during this admission and during the previuos admission when I cared for him@@ ? ? ? ?Seizure disorder ([place]) ?Continue Depakote-valproic acid level 41 previous level on 2/10 was 0.2 ?History of nonadherence and valproic acid on 1/22 was 19 ?EEG negative for acute seizure activity ?Continue seizure precautions ?Depakote can also function as a mood stabilizer ?MRI revealed arachnoid cyst that is chronic in nature ?S/p  grand mal seizure with postictal phase on the evening of 2/26 presumed to be precipitated/provoked by abrupt hyponatremia with sodium less than 120.  Valproic acid level 58 ? ?  Elevated CK ?Secondary to recent seizure activity as well as volume depletion ?Encourage oral intake of electrolyte based fluids ?CK trend is downward.  2298 > 1926 > 1616 > 970 and now is less than 400 ? ?Paranoid schizophrenia with hallucinations ?Appreciate psychiatric team help-on 3/22 they are reevaluating patient ?Schizophrenia symptoms and decompensation worsened by recurrent  hyponatremia ?Also has a component of anxiety and during previous admission requested antianxiety medications ?Continue Seroquel and Invega ?Continue Xanax but increased from 0.25 to 0.5 mg since this will also help with tobacco cravings ?Have allowed IVC to expire since no longer needed ?Patient is very appreciative of LCSW Madilyn Fireman assistance ? ?Chronic systolic CHF (congestive heart failure) ([place]) ?Continue digoxin and Entresto  ?Lasix discontinued during recent episode of acute hyponatremia with seizure activity as well as hypotension we will continue to hold until CK normalized ?Digoxin level subtherapeutic as of 2/10 but this also appears to be a chronic issue.  Follow periodically ?Strict I/O and daily weights ?Fluid restriction for chronic hyponatremia for free water primarily-allowing gatorade and other electrolyte based fluids to not be included in FR ?Last echo: 5/21 with EF of 25-30% with global hypokinesis, RV overload and indeterminate diastolic dysfunction. Does not appear that he has followed up with cardiology. Gets meds from ED.  ?Echocardiogram performed on 09/26/2021 EF of 30-35% with moderately decreased systolic function and global hypokinesis. RV size and systolic function is normal. ?Weight measurements have been inconsistent and appear to be inaccurate ? ?Dementia without behavioral disturbance ([place]) ?During initial admission over 1 year ago had formal neuropsychiatric evaluation by Dr. Sima Matas who diagnosed patient with dementia likely related to years of schizophrenia and nonadherence to medication regimen ?SLUMS evaluation worsened since previous admission noting has declined from 18 to 12.  He lacks capacity to make decisions ?APS investigating guardianship noting no other family members or friends are willing to pursue guardianship ?Had previously initiated Vistaril for patient's anxiety issues.  Not effective.  During previous hospitalization I used Xanax 0.25 mg BID and this was  more effective with the patient therefore I have transition to Xanax as of 3/1 ? ? ? ? ?Peripheral neuropathy ?Chronic issue and was present during previous admission ?Likely related to years of substance abuse ?Continue low-dose Lyrica since was effective previous ? ?A physical therapy consult is indicated based on the patient?s mobility assessment. ? ? ?Mobility Assessment (last 72 hours)   ? ? Mobility Assessment   ? ? [place] Name 11/01/21 0730 10/31/21 0730 10/30/21 1128 10/29/21 2100 10/29/21 0808  ? Does patient have an order for bedrest or is patient medically unstable No - Continue assessment No - Continue assessment No - Continue assessment No - Continue assessment No - Continue assessment  ? What is the highest level of mobility based on the progressive mobility assessment? Level 6 (Walks independently in room and hall) - Balance while walking in room without assist - Complete Level 6 (Walks independently in room and hall) - Balance while walking in room without assist - Complete Level 6 (Walks independently in room and hall) - Balance while walking in room without assist - Complete Level 6 (Walks independently in room and hall) - Balance while walking in room without assist - Complete Level 5 (Walks with assist in room/hall) - Balance while stepping forward/back and can walk in room with assist - Complete  ? ?  ?  ? ?  ? ? ? ?Mixed hyperlipidemia ?Continue lipitor ?Apparently has been nonadherent with medicines prior  to admission ? ?COPD (chronic obstructive pulmonary disease) ([place]) ?Compensated ?Given Pneumovax immunization this admission ? ?Constipation-resolved as of 11/02/2021 ?No bowel movement for several days ?Nursing states patient has been refusing preventative laxatives-as of 2/23 patient is agreeing to take stool softeners and laxative ?Give one-time dose of milk of magnesia to promote bowel movement ? ? ? ? ? ? ? ?Subjective:  ?Laying in bed listening to music and working on a model car.  Had  questions regarding discharge disposition as to whether he could go to his own apartment or go to a group home.  I stated he needed to go to a group home for supervision to make sure he would take his med

## 2021-11-25 DIAGNOSIS — E871 Hypo-osmolality and hyponatremia: Secondary | ICD-10-CM | POA: Diagnosis not present

## 2021-11-25 NOTE — Progress Notes (Signed)
?Progress Note ? ? ?Patient: Jim Dunn DVV:[id number] DOB: 1966/01/07 DOA: 09/24/2021     59 ?DOS: the patient was seen and examined on 11/25/2021 ?  ?Brief hospital course: ?Jim Dunn is a 56 y.o. male with PMH significant for schizophrenia, chronic hyponatremia, seizure disorder, tobacco use, systolic congestive heart failure, COPD who was brought to ED on 09/24/2021 by his family after they were unable to take care of him.  He was drinking a lot of water and trying to burn things. Previously he was staying at group home and was kicked out.   ?Neurology and psychiatry were consulted.   ?Hospital course remarkable for acute on chronic hyponatremia, breakthrough seizures. ?Prolonged hospitalization.   ?TOC following.  ?APS in process to determine if they will pursue guardianship ?  ? ?Patient's behaviors have been stable.  Process was put in place to allow patient to briefly leave the floor given long length of stay.  Criteria were as follows: Must notify the nurse when he was leaving the floor, would not be allowed to leave the floor for longer than 30 minutes and he was not allowed to leave hospital grounds.  If any 1 of these criteria were broken off unit privileges would be revoked.  It is noted that eventual discharge plan would be to a group home although 24/7 monitoring of fluid intake would be difficult in that setting. ? ?On the evening of 2/26 patient developed a grand mal seizure and it was noted his sodium had decreased significantly to 117.  It was felt that patient had been drinking fluids in his room and off the unit leading to hypervolemic hyponatremia.  He has a history of polydipsia and has had seizures related to this in the past.  He was placed on hypertonic saline and transferred to the ICU service.  Of note patient had been on Lasix, total fluid restriction versus only free water restriction and salt tablets.  As of now Lasix has been discontinued.  Fluid restriction pertains to only free water  and prior to ICU team assuming care orders changed to allow pt to consume Gatorade and other electrolyte-based fluids to help with excessive thirst. ? ? ? ? 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  ?2/26 hyponatremic related seizure with nadir all sodium 116.  Current sodium stable ?Continue to hold Lasix.  Restriction will focus on free water only (including coffee).  Patient is allowed electrolyte-based fluids such as Gatorade as well as juices and milk that do not count towards the fluid restriction ?@@Please  do not dc his xanax-this drug has been extremely helpful in the management of his psychogenic polydipsia both during this admission and during the previuos admission when I cared for him@@ ? ? ? ?Seizure disorder ([facility]) ?Continue Depakote-History of nonadherence and valproic acid on 1/22 was 19 ?MRI revealed arachnoid cyst that is chronic in nature ?S/p  grand mal seizure with postictal phase on the evening of 2/26 presumed to be precipitated/provoked by abrupt hyponatremia with sodium less than 120.  Valproic acid level 58 ? ?Elevated CK-resolved as of 11/25/2021 ?Resolved ? ?Paranoid schizophrenia with hallucinations ?Schizophrenia symptoms and decompensation worsened by recurrent hyponatremia ?Continue Seroquel and Invega ?Continue Xanax but increased from 0.25 to 0.5 mg since this will also help with tobacco cravings and in the past this is helped with  his psychogenic polydipsia ? ? ?Chronic systolic CHF (congestive heart failure) ([facility]) ?Continue digoxin and Entresto  ?Lasix discontinued in context of recurrent hyponatremia.  Previously had used as needed weight gain ?Digoxin level subtherapeutic as of 2/10 but this also appears to be a chronic issue.  Follow periodically ?Strict I/O and daily weights ?Fluid  restriction for chronic hyponatremia for free water primarily-allowing gatorade and other electrolyte based fluids to not be included in FR ?Last echo: 5/21 with EF of 25-30% with global hypokinesis, RV overload and indeterminate diastolic dysfunction. Does not appear that he has followed up with cardiology. [name]  ?Most recent echocardiogram performed on 09/26/2021 EF of 30-35% with moderately decreased systolic function and global hypokinesis. RV size and systolic function is normal. ?Weight measurements have been inconsistent and appear to be inaccurate ? ?Dementia without behavioral disturbance ([facility]) ?During initial admission over 1 year ago had formal neuropsychiatric evaluation by Dr. Kieth Brightly who diagnosed patient with dementia likely related to years of schizophrenia and nonadherence to medication regimen ?SLUMS evaluation worsened since previous admission noting has declined from 18 to 12.  He lacks capacity to make decisions ?With the assistance of APS interim already in ship has been completed since family not interested in taking on this role. ?Vistaril ineffective for anxiety therefore Xanax initiated.  Please see documentation and other problems regarding rationale for using this medication ? ? ? ?Peripheral neuropathy ?Chronic issue and was present during previous admission ?Likely related to years of substance abuse ?Continue low-dose Lyrica since was effective previous ? ?A physical therapy consult is indicated based on the patient?s mobility assessment. ? ? ?Mobility Assessment (last 72 hours)   ? ? Mobility Assessment   ? ? Row Name 11/01/21 0730 10/31/21 0730 10/30/21 1128 10/29/21 2100 10/29/21 0808  ? Does patient have an order for bedrest or is patient medically unstable No - Continue assessment No - Continue assessment No - Continue assessment No - Continue assessment No - Continue assessment  ? What is the highest level of mobility based on the progressive mobility assessment? Level 6 (Walks  independently in room and hall) - Balance while walking in room without assist - Complete Level 6 (Walks independently in room and hall) - Balance while walking in room without assist - Complete Level 6 (Walks independently in room and hall) - Balance while walking in room without assist - Complete Level 6 (Walks independently in room and hall) - Balance while walking in room without assist - Complete Level 5 (Walks with assist in room/hall) - Balance while stepping forward/back and can walk in room with assist - Complete  ? ?  ?  ? ?  ? ? ? ?Mixed hyperlipidemia ?Continue lipitor ?Apparently has been nonadherent with medicines prior to admission ? ?COPD (chronic obstructive pulmonary disease) ([facility]) ?Compensated ?Given Pneumovax immunization this admission ? ?Constipation-resolved as of 11/02/2021 ?No bowel movement for several days ?Nursing states patient has been refusing preventative laxatives-as of 2/23 patient is agreeing to take stool softeners and laxative ?Give one-time dose of milk of magnesia to promote bowel movement ? ? ? ? ? ? ? ?Subjective:  ?Sitting on bed once again working on model car.  Noted to be having some difficulty putting the car together despite instructions.  He has not made any progress when compared to yesterday.  Had questions regarding placement in group home and guardianship.  Also requested to be placed in a group home where he could smoke. ? ?Physical Exam: ?Vitals:  ? 11/24/21  1531 11/24/21 1948 11/25/21 0401 11/25/21 0839  ?BP: (!) 142/55 (!) 157/90 115/80 (!) 147/101  ?Pulse: 70 80 73 74  ?Resp: 20 16 15 18   ?Temp: 98.2 ?F (36.8 ?C) 98 ?F (36.7 ?C) 98 ?F (36.7 ?C) (!) 97.5 ?F (36.4 ?C)  ?TempSrc: Oral Oral  Oral  ?SpO2: 95% 99% 99% 100%  ?Weight:      ?Height:      ? ?Gen: No acute distress, calm  ?Pulm: Stable on room air, posterior lung sounds clear.  Normal respiratory effort ?Cardiac: Regular pulse,S1-S2, no peripheral edema, normotensive ?Abd: Soft nontender, nondistended,  normoactive bowel sounds. LBM 3/21 ?Neuro: Cranial nerves II through XII grossly intact, moves all extremities x4 without any focal neurological deficits.  Sensation intact.  Mobilizes independently ?Psych:

## 2021-11-26 DIAGNOSIS — E871 Hypo-osmolality and hyponatremia: Secondary | ICD-10-CM | POA: Diagnosis not present

## 2021-11-26 LAB — COMPREHENSIVE METABOLIC PANEL
ALT: 12 U/L (ref 0–44)
AST: 15 U/L (ref 15–41)
Albumin: 3.5 g/dL (ref 3.5–5.0)
Alkaline Phosphatase: 54 U/L (ref 38–126)
Anion gap: 9 (ref 5–15)
BUN: 8 mg/dL (ref 6–20)
CO2: 24 mmol/L (ref 22–32)
Calcium: 8.6 mg/dL — ABNORMAL LOW (ref 8.9–10.3)
Chloride: 96 mmol/L — ABNORMAL LOW (ref 98–111)
Creatinine, Ser: 0.58 mg/dL — ABNORMAL LOW (ref 0.61–1.24)
GFR, Estimated: >60 mL/min (ref >60)
Glucose, Bld: 97 mg/dL (ref 70–99)
Potassium: 3.9 mmol/L (ref 3.5–5.1)
Sodium: 129 mmol/L — ABNORMAL LOW (ref 135–145)
Total Bilirubin: 0.6 mg/dL (ref 0.3–1.2)
Total Protein: 6 g/dL — ABNORMAL LOW (ref 6.5–8.1)

## 2021-11-26 LAB — CBC WITH DIFFERENTIAL/PLATELET
Abs Immature Granulocytes: 0.06 10*3/uL (ref 0.00–0.07)
Basophils Absolute: 0 10*3/uL (ref 0.0–0.1)
Basophils Relative: 1 %
Eosinophils Absolute: 0.2 10*3/uL (ref 0.0–0.5)
Eosinophils Relative: 2 %
HCT: 37.1 % — ABNORMAL LOW (ref 39.0–52.0)
Hemoglobin: 12.2 g/dL — ABNORMAL LOW (ref 13.0–17.0)
Immature Granulocytes: 1 %
Lymphocytes Relative: 48 %
Lymphs Abs: 3.3 10*3/uL (ref 0.7–4.0)
MCH: 22.6 pg — ABNORMAL LOW (ref 26.0–34.0)
MCHC: 32.9 g/dL (ref 30.0–36.0)
MCV: 68.8 fL — ABNORMAL LOW (ref 80.0–100.0)
Monocytes Absolute: 0.9 10*3/uL (ref 0.1–1.0)
Monocytes Relative: 12 %
Neutro Abs: 2.6 10*3/uL (ref 1.7–7.7)
Neutrophils Relative %: 36 %
Platelets: 178 10*3/uL (ref 150–400)
RBC: 5.39 MIL/uL (ref 4.22–5.81)
RDW: 15.9 % — ABNORMAL HIGH (ref 11.5–15.5)
WBC: 7 10*3/uL (ref 4.0–10.5)
nRBC: 0 % (ref 0.0–0.2)

## 2021-11-26 NOTE — Progress Notes (Signed)
?                                  PROGRESS NOTE                                             ?                                                                                                                     ?                                         ? ? Patient Demographics:  ? ? Jim Dunn, is a 56 y.o. male, DOB - 06-25-1966, [id number] ? ?Outpatient Primary MD for the patient is Pcp, No    LOS - 58  Admit date - 09/24/2021   ? ?Chief Complaint  ?Patient presents with  ? Altered Mental Status  ?  Abnormal labs  ?    ? ?Brief Narrative (HPI from H&P)   56 y.o. male with PMH significant for schizophrenia, chronic hyponatremia, seizure disorder, tobacco use, systolic congestive heart failure, COPD who was brought to ED on 09/24/2021 by his family after they were unable to take care of him.  He was found to be hyponatremic he had symptoms suggestive of severe primary polydipsia.  He also had seizures.  He was seen by psych and neurology.  Currently on fluid restriction, still noncompliant with fluid restriction, he is undergoing legal guardianship and awaiting placement. ? ? Subjective:  ? ? [name] today has, No headache, No chest pain, No abdominal pain - No Nausea, No new weakness tingling or numbness, no SOB ? ? Assessment  & Plan :  ? ? 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  He is still drinking copious amounts of soda on a daily basis.  Continue salt tablets, continue Xanax per psych. ? ? ?Peripheral neuropathy ?Chronic issue and was present during previous admission ?Likely related to years of substance abuse ?Continue low-dose Lyrica since was effective previous ? ?Dementia without behavioral disturbance (HCC) ?During initial admission over 1 year ago had formal neuropsychiatric evaluation by Dr. Kieth Brightly who  diagnosed patient with dementia likely related to years of schizophrenia and nonadherence to medication regimen ?SLUMS evaluation worsened since previous admission noting has declined from 18 to 12.  He lacks capacity to make decisions ?With the assistance of APS interim already in ship has been completed since family not interested in taking on this role. ?Vistaril ineffective for anxiety therefore Xanax initiated.  Please see documentation and other problems  regarding rationale for using this medication, psych following undergoing legal guardianship evaluation and placement evaluation. ? ? ?Paranoid schizophrenia with hallucinations ?Schizophrenia symptoms and decompensation worsened by recurrent hyponatremia ?Continue Seroquel and Invega ?Continue Xanax but increased from 0.25 to 0.5 mg since this will also help with tobacco cravings and in the past this is helped with his psychogenic polydipsia ? ? ?Seizure disorder (HCC) [place] due to hyponatremia. ?Continue Depakote-History of nonadherence and valproic acid on 1/22 was 19 ?MRI revealed arachnoid cyst that is chronic in nature ?S/p  grand mal seizure with postictal phase on the evening of 2/26 presumed to be precipitated/provoked by abrupt hyponatremia with sodium less than 120.  Valproic acid level 58, seen by neurology this admission.  Currently stable. ? ?COPD (chronic obstructive pulmonary disease) (HCC) ?Compensated ?Given Pneumovax immunization this admission ? ?Mixed hyperlipidemia ?Continue lipitor ?Apparently has been nonadherent with medicines prior to admission ? ?Chronic systolic CHF (congestive heart failure) (HCC) ?Continue digoxin and Entresto  ?Lasix discontinued in context of recurrent hyponatremia.  Previously had used as needed weight gain ?Digoxin level subtherapeutic as of 2/10 but this also appears to be a chronic issue.  Follow periodically ?Strict I/O and daily weights ?Fluid restriction for chronic hyponatremia for free water  primarily-allowing gatorade and other electrolyte based fluids to not be included in FR ?Last echo: 5/21 with EF of 25-30% with global hypokinesis, RV overload and indeterminate diastolic dysfunction. Does not appear that he has followed up with cardiology. [name]  ?Most recent echocardiogram performed on 09/26/2021 EF of 30-35% with moderately decreased systolic function and global hypokinesis. RV size and systolic function is normal. ?Weight measurements have been inconsistent and appear to be inaccurate, post discharge outpatient cardiology follow-up ? ? ?Elevated CK-resolved as of 11/25/2021 ?Resolved ? ?Constipation-resolved as of 11/02/2021 ?No bowel movement for several days ?Nursing states patient has been refusing preventative laxatives-as of 2/23 patient is agreeing to take stool softeners and laxative ?Give one-time dose of milk of magnesia to promote bowel movement ? ? ? ? ?   ? ?Condition - Fair ? ?Family Communication  :  None present ? ?Code Status :  Full ? ?Consults  :  Psych, Neurology ? ?PUD Prophylaxis :  ? ? Procedures  :    ? ? EEG ? ?TTE ? ?   ? ?Disposition Plan  :   ? ?Status is: Inpatient ? ?DVT Prophylaxis  :   ? ?Place and maintain sequential compression device Start: 11/01/21 1711 ? ?Lab Results  ?Component Value Date  ? PLT 178 11/26/2021  ? ? ?Diet :  ?Diet Order   ? ?       ?  Diet regular Room service appropriate? No; Fluid consistency: Thin; Fluid restriction: 1200 mL Fluid  Diet effective now       ?  ? ?  ?  ? ?  ?  ? ?Inpatient Medications ? ?Scheduled Meds: ? ALPRAZolam  0.5 mg Oral BID  ? atorvastatin  40 mg Oral Daily  ? carvedilol  6.25 mg Oral BID WC  ? digoxin  0.25 mg Oral Daily  ? divalproex  500 mg Oral BID  ? melatonin  3 mg Oral QHS  ? mirtazapine  15 mg Oral QHS  ? nicotine  14 mg Transdermal Daily  ? paliperidone  3 mg Oral Daily  ? pregabalin  25 mg Oral TID  ? QUEtiapine  50 mg Oral QHS  ? sacubitril-valsartan  1 tablet Oral BID  ? senna-docusate  2 tablet Oral BID  ?  sodium chloride  2 g Oral BID WC  ? ?Continuous Infusions: ?PRN Meds:.acetaminophen **OR** acetaminophen, haloperidol lactate, hydrALAZINE, ipratropium-albuterol, LORazepam, polyethylene glycol ? ?Antibiotics  :   ? ?Anti-infectives (From admission, onward)  ? ? None  ? ?  ? ? ? Time Spent in minutes  30 ? ? ?Susa Raring M.D on 11/26/2021 at 8:14 AM ? ?To page go to [email or web address]  ? ?[facility] -  Office  [phone] ? ?See all Orders from today for further details ? ? ? Objective:  ? ?Vitals:  ? 11/25/21 1652 11/25/21 1951 11/25/21 2335 11/26/21 9449  ?BP: (!) 162/96 (!) 164/106 128/90 116/72  ?Pulse: 91 71 77 68  ?Resp: 18 17 17 18   ?Temp: 98.6 ?F (37 ?C) 98.2 ?F (36.8 ?C) 98 ?F (36.7 ?C) 98.1 ?F (36.7 ?C)  ?TempSrc: Oral Oral  Oral  ?SpO2: 99% 100% 96% 100%  ?Weight:      ?Height:      ? ? ?Wt Readings from Last 3 Encounters:  ?11/24/21 111.8 kg  ?07/27/21 95.3 kg  ?07/22/20 98.2 kg  ? ? ? ?Intake/Output Summary (Last 24 hours) at 11/26/2021 0814 ?Last data filed at 11/25/2021 1600 ?Gross per 24 hour  ?Intake 1200 ml  ?Output --  ?Net 1200 ml  ? ? ? ?Physical Exam ? ?Awake Alert, No new F.N deficits,  ?[place].AT,PERRAL ?Supple Neck, No JVD,   ?Symmetrical Chest wall movement, Good air movement bilaterally, CTAB ?RRR,No Gallops,Rubs or new Murmurs,  ?+ve B.Sounds, Abd Soft, No tenderness,   ?No Cyanosis, Clubbing or edema  ?  ?  ? ? Data Review:  ? ? ?CBC ?Recent Labs  ?Lab 11/26/21 ?0430  ?WBC 7.0  ?HGB 12.2*  ?HCT 37.1*  ?PLT 178  ?MCV 68.8*  ?MCH 22.6*  ?MCHC 32.9  ?RDW 15.9*  ?LYMPHSABS 3.3  ?MONOABS 0.9  ?EOSABS 0.2  ?BASOSABS 0.0  ? ? ?Electrolytes ?Recent Labs  ?Lab 11/19/21 ?11/21/21 11/26/21 ?0430  ?NA 132* 129*  ?K 3.8 3.9  ?CL 98 96*  ?CO2 25 24  ?GLUCOSE 102* 97  ?BUN 7 8  ?CREATININE 0.68 0.58*  ?CALCIUM 8.8* 8.6*  ?AST  --  15  ?ALT  --  12  ?ALKPHOS  --  54  ?BILITOT  --  0.6  ?ALBUMIN  --  3.5  ? ? ? ?Micro Results ?No results found for this or any previous visit (from the past 240  hour(s)). ? ?Radiology Reports ?No results found.  ? ? ?

## 2021-11-27 DIAGNOSIS — E871 Hypo-osmolality and hyponatremia: Secondary | ICD-10-CM | POA: Diagnosis not present

## 2021-11-27 LAB — BASIC METABOLIC PANEL
Anion gap: 8 (ref 5–15)
BUN: 11 mg/dL (ref 6–20)
CO2: 24 mmol/L (ref 22–32)
Calcium: 9.2 mg/dL (ref 8.9–10.3)
Chloride: 100 mmol/L (ref 98–111)
Creatinine, Ser: 0.57 mg/dL — ABNORMAL LOW (ref 0.61–1.24)
GFR, Estimated: >60 mL/min (ref >60)
Glucose, Bld: 86 mg/dL (ref 70–99)
Potassium: 4.2 mmol/L (ref 3.5–5.1)
Sodium: 132 mmol/L — ABNORMAL LOW (ref 135–145)

## 2021-11-27 LAB — MAGNESIUM: Magnesium: 2.2 mg/dL (ref 1.7–2.4)

## 2021-11-27 MED ORDER — CARVEDILOL 12.5 MG PO TABS
12.5000 mg | ORAL_TABLET | Freq: Two times a day (BID) | ORAL | Status: DC
  Administered 2021-11-27 – 2022-01-08 (×83): 12.5 mg via ORAL
  Filled 2021-11-27 (×85): qty 1

## 2021-11-27 NOTE — Progress Notes (Signed)
?                                  PROGRESS NOTE                                             ?                                                                                                                     ?                                         ? ? Patient Demographics:  ? ? Jim Dunn, is a 55 y.o. male, DOB - May 31, 1966, [id number] ? ?Outpatient Primary MD for the patient is Pcp, No    LOS - 59  Admit date - 09/24/2021   ? ?Chief Complaint  ?Patient presents with  ? Altered Mental Status  ?  Abnormal labs  ?    ? ?Brief Narrative (HPI from H&P)   56 y.o. male with PMH significant for schizophrenia, chronic hyponatremia, seizure disorder, tobacco use, systolic congestive heart failure, COPD who was brought to ED on 09/24/2021 by his family after they were unable to take care of him.  He was found to be hyponatremic he had symptoms suggestive of severe primary polydipsia.  He also had seizures.  He was seen by psych and neurology.  Currently on fluid restriction, still noncompliant with fluid restriction, he is undergoing legal guardianship and awaiting placement. ? ? Subjective:  ? ?Patient in bed, appears comfortable, denies any headache, no fever, no chest pain or pressure, no shortness of breath , no abdominal pain. No new focal weakness. ? Assessment  & Plan :  ? ? 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  He is still drinking copious amounts of soda on a daily basis.  Continue salt tablets, continue Xanax per psych. ? ?Dementia without behavioral disturbance (HCC)  During initial admission over 1 year ago had formal neuropsychiatric evaluation by Dr. Kieth Brightly who diagnosed patient with dementia likely related to years of schizophrenia and nonadherence to medication regimen. SLUMS evaluation worsened since previous  admission noting has declined from 18 to 12.  He lacks capacity to make decisions With the assistance of APS interim already in ship has been completed since family not interested in taking on this role. Psych following undergoing legal guardianship evaluation and placement evaluation. ? ?Paranoid schizophrenia with hallucinations -  Schizophrenia symptoms and decompensation worsened by recurrent hyponatremia, Continue Seroquel and Invega, continue Xanax but increased from 0.25 to 0.5 mg since  this will also help with tobacco cravings and in the past this is helped with his psychogenic polydipsia. ? ?Seizure disorder (HCC) [place] due to hyponatremia.  Continue Depakote-History of nonadherence and valproic acid on 1/22 was 19, MRI revealed arachnoid cyst that is chronic in nature, last breakthrough seizure on 10/30/2021 due to hyponatremia.  Seen by neurology this admission ?  ?Chronic systolic CHF (congestive heart failure) (HCC) EF 30% on last echocardiogram in January 2023.  Continue Coreg, digoxin and Entresto, as needed Lasix.  No acute issue.  Require outpatient cardiology follow-up postdischarge. ?  ?COPD (chronic obstructive pulmonary disease) (HCC) - Compensated, given Pneumovax immunization this admission. ? ?Peripheral neuropathy - Chronic issue and was present during previous admission, likely related to years of substance abuse ?Continue low-dose Lyrica since was effective previous ? ?Hyperlipidemia -  Continue lipitor ?  ?Elevated CK-resolved as of 11/25/2021, Resolved. ? ?Constipation-resolved as of 11/02/2021. ? ? ?   ? ?Condition - Fair ? ?Family Communication  :  None present ? ?Code Status :  Full ? ?Consults  :  Psych, Neurology ? ?PUD Prophylaxis :  ? ? Procedures  :    ? ? EEG ? ?TTE ? ?   ? ?Disposition Plan  :   ? ?Status is: Inpatient ? ?DVT Prophylaxis  :   ? ?Place and maintain sequential compression device Start: 11/01/21 1711 ? ?Lab Results  ?Component Value Date  ? PLT 178 11/26/2021   ? ? ?Diet :  ?Diet Order   ? ?       ?  Diet regular Room service appropriate? No; Fluid consistency: Thin; Fluid restriction: 1200 mL Fluid  Diet effective now       ?  ? ?  ?  ? ?  ?  ? ?Inpatient Medications ? ?Scheduled Meds: ? ALPRAZolam  0.5 mg Oral BID  ? atorvastatin  40 mg Oral Daily  ? carvedilol  12.5 mg Oral BID WC  ? digoxin  0.25 mg Oral Daily  ? divalproex  500 mg Oral BID  ? melatonin  3 mg Oral QHS  ? mirtazapine  15 mg Oral QHS  ? nicotine  14 mg Transdermal Daily  ? paliperidone  3 mg Oral Daily  ? pregabalin  25 mg Oral TID  ? QUEtiapine  50 mg Oral QHS  ? sacubitril-valsartan  1 tablet Oral BID  ? senna-docusate  2 tablet Oral BID  ? sodium chloride  2 g Oral BID WC  ? ?Continuous Infusions: ?PRN Meds:.acetaminophen **OR** acetaminophen, haloperidol lactate, hydrALAZINE, ipratropium-albuterol, LORazepam, polyethylene glycol ? ?Antibiotics  :   ? ?Anti-infectives (From admission, onward)  ? ? None  ? ?  ? ? ? Time Spent in minutes  30 ? ? ?Susa Raring M.D on 11/27/2021 at 9:54 AM ? ?To page go to [email or web address]  ? ?[facility] -  Office  [phone] ? ?See all Orders from today for further details ? ? ? Objective:  ? ?Vitals:  ? 11/26/21 2359 11/27/21 0405 11/27/21 0500 11/27/21 5643  ?BP: 128/68 (!) 151/84  (!) 163/100  ?Pulse: 77 75  71  ?Resp: 20 18  18   ?Temp: 98.6 ?F (37 ?C) 98.6 ?F (37 ?C)  98 ?F (36.7 ?C)  ?TempSrc: Oral Oral  Oral  ?SpO2: 99% 99%  100%  ?Weight:   110 kg   ?Height:      ? ? ?Wt Readings from Last 3 Encounters:  ?11/27/21 110 kg  ?07/27/21 95.3 kg  ?07/22/20 98.2  kg  ? ? ? ?Intake/Output Summary (Last 24 hours) at 11/27/2021 0954 ?Last data filed at 11/27/2021 4818 ?Gross per 24 hour  ?Intake 120 ml  ?Output --  ?Net 120 ml  ? ? ? ?Physical Exam ? ?Awake Alert, No new F.N deficits, Normal affect ?[place].AT,PERRAL ?Supple Neck, No JVD,   ?Symmetrical Chest wall movement, Good air movement bilaterally, CTAB ?RRR,No Gallops, Rubs or new Murmurs,  ?+ve B.Sounds, Abd Soft,  No tenderness,   ?No Cyanosis, Clubbing or edema  ? ? ? Data Review:  ? ? ?CBC ?Recent Labs  ?Lab 11/26/21 ?0430  ?WBC 7.0  ?HGB 12.2*  ?HCT 37.1*  ?PLT 178  ?MCV 68.8*  ?MCH 22.6*  ?MCHC 32.9  ?RDW 15.9*  ?LYMPHSABS 3.3  ?MONOABS 0.9  ?EOSABS 0.2  ?BASOSABS 0.0  ? ? ?Electrolytes ?Recent Labs  ?Lab 11/26/21 ?0430 11/27/21 ?0700  ?NA 129* 132*  ?K 3.9 4.2  ?CL 96* 100  ?CO2 24 24  ?GLUCOSE 97 86  ?BUN 8 11  ?CREATININE 0.58* 0.57*  ?CALCIUM 8.6* 9.2  ?AST 15  --   ?ALT 12  --   ?ALKPHOS 54  --   ?BILITOT 0.6  --   ?ALBUMIN 3.5  --   ?MG  --  2.2  ? ? ? ?Micro Results ?No results found for this or any previous visit (from the past 240 hour(s)). ? ?Radiology Reports ?No results found.  ? ? ?

## 2021-11-27 NOTE — Progress Notes (Signed)
Patient continues to ask staff, patients and visitors for cigarettes. He has been reminded of the no smoking policy numerous times. Per staff he kept circling the hallways then fled the unit to go outside without supervision. Security was called, patient was located outside at [facility] entrance; outside order is discontinued per MD. ?

## 2021-11-28 DIAGNOSIS — E871 Hypo-osmolality and hyponatremia: Secondary | ICD-10-CM | POA: Diagnosis not present

## 2021-11-28 LAB — BASIC METABOLIC PANEL
Anion gap: 6 (ref 5–15)
BUN: 10 mg/dL (ref 6–20)
CO2: 27 mmol/L (ref 22–32)
Calcium: 9 mg/dL (ref 8.9–10.3)
Chloride: 99 mmol/L (ref 98–111)
Creatinine, Ser: 0.66 mg/dL (ref 0.61–1.24)
GFR, Estimated: >60 mL/min (ref >60)
Glucose, Bld: 91 mg/dL (ref 70–99)
Potassium: 4.1 mmol/L (ref 3.5–5.1)
Sodium: 132 mmol/L — ABNORMAL LOW (ref 135–145)

## 2021-11-28 NOTE — Progress Notes (Signed)
CSW received notification from [place] [employer] [place] [employer] who states the patient's court hearing is 01/25/2022 at 3pm. ? ?[name], MSW, LCSW ?Transitions of Care  Clinical Social Worker II ?[phone] ? ?

## 2021-11-28 NOTE — Progress Notes (Signed)
?Progress Note ? ? ?Patient: Jim Dunn ZOX:[id number] DOB: June 21, 1966 DOA: 09/24/2021     60 ?DOS: the patient was seen and examined on 11/28/2021 ?  ?Brief hospital course: ?Arzell Hall is a 56 y.o. male with PMH significant for schizophrenia, chronic hyponatremia, seizure disorder, tobacco use, systolic congestive heart failure, COPD who was brought to ED on 09/24/2021 by his family after they were unable to take care of him.  He was drinking a lot of water and trying to burn things. Previously he was staying at group home and was kicked out.   ?Neurology and psychiatry were consulted.   ?Hospital course remarkable for acute on chronic hyponatremia, breakthrough seizures. ?Prolonged hospitalization.   ?TOC following.  ?APS in process to determine if they will pursue guardianship ?  ? ?Patient's behaviors have been stable.  Process was put in place to allow patient to briefly leave the floor given long length of stay.  Criteria were as follows: Must notify the nurse when he was leaving the floor, would not be allowed to leave the floor for longer than 30 minutes and he was not allowed to leave hospital grounds.  If any 1 of these criteria were broken off unit privileges would be revoked.  It is noted that eventual discharge plan would be to a group home although 24/7 monitoring of fluid intake would be difficult in that setting. ? ?On the evening of 2/26 patient developed a grand mal seizure and it was noted his sodium had decreased significantly to 117.  It was felt that patient had been drinking fluids in his room and off the unit leading to hypervolemic hyponatremia.  He has a history of polydipsia and has had seizures related to this in the past.  He was placed on hypertonic saline and transferred to the ICU service.  Of note patient had been on Lasix, total fluid restriction versus only free water restriction and salt tablets.  As of now Lasix has been discontinued.  Fluid restriction pertains to only free water  and prior to ICU team assuming care orders changed to allow pt to consume Gatorade and other electrolyte-based fluids to help with excessive thirst. ? ? ? ? 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  ?2/26 hyponatremic related seizure with nadir all sodium 116.  Current sodium stable ?As of 3/27 sodium stable at 130 ?Continue to hold Lasix.  Restriction will focus on free water only (including coffee).  Patient is allowed electrolyte-based fluids such as Gatorade as well as juices and milk that do not count towards the fluid restriction ?@@Please  do not dc his xanax-this drug has been extremely helpful in the management of his psychogenic polydipsia both during this admission and during the previuos admission when I cared for him@@ ? ? ? ?Seizure disorder (HCC) ?Continue Depakote-History of nonadherence and valproic acid on 1/22 was 19 ?MRI revealed arachnoid cyst that is chronic in nature ?S/p  grand mal seizure with postictal phase on the evening of 2/26 presumed to be precipitated/provoked by abrupt hyponatremia with sodium less than 120.  Valproic acid level 58 ? ?Elevated CK-resolved as of 11/25/2021 ?Resolved ? ?Paranoid schizophrenia with hallucinations ?Schizophrenia symptoms and decompensation worsened by recurrent hyponatremia ?Continue Seroquel and Invega ?Continue Xanax but increased from 0.25 to 0.5 mg since this will also help with tobacco cravings and  in the past this is helped with his psychogenic polydipsia ?Patient repeatedly asking to have off unit privileges according to staff.  Discussed with unit director and staff and explained to them what occurred when he previously had off unit privileges in regards to concerns over possible drinking excessive fluids.  Staff offered option of setting patient's  phone alarm to 15 minutes and he must return within this timeframe.  Order written for patient to be allowed off unit for 15 minutes per episode with alarm set to remind patient to return and prior to leaving the unit he must notify the staff.  If these rules and not followed off unit privileges will be revoked. ? ? ?Chronic systolic CHF (congestive heart failure) (HCC) ?Continue digoxin and Entresto  ?Lasix discontinued in context of recurrent hyponatremia.  Previously had used as needed weight gain ?Digoxin level subtherapeutic as of 2/10 but this also appears to be a chronic issue.  Follow periodically ?Strict I/O and daily weights ?Fluid restriction for chronic hyponatremia for free water primarily-allowing gatorade and other electrolyte based fluids to not be included in FR ?Last echo: 5/21 with EF of 25-30% with global hypokinesis, RV overload and indeterminate diastolic dysfunction. Does not appear that he has followed up with cardiology. [name]  ?Most recent echocardiogram performed on 09/26/2021 EF of 30-35% with moderately decreased systolic function and global hypokinesis. RV size and systolic function is normal. ?Weight measurements have been inconsistent and appear to be inaccurate ? ?Dementia without behavioral disturbance (HCC) ?During initial admission over 1 year ago had formal neuropsychiatric evaluation by Dr. Kieth Brightly who diagnosed patient with dementia likely related to years of schizophrenia and nonadherence to medication regimen ?SLUMS evaluation worsened since previous admission noting has declined from 18 to 12.  He lacks capacity to make decisions ?With the assistance of APS interim already in ship has been completed since family not interested in taking on this role. ?Vistaril ineffective for anxiety therefore Xanax initiated.  Please see documentation and other problems regarding rationale for using this medication ? ? ? ?Peripheral neuropathy ?Chronic issue and was present during previous  admission ?Likely related to years of substance abuse ?Continue low-dose Lyrica since was effective previous ? ?A physical therapy consult is indicated based on the patient?s mobility assessment. ? ? ?Mobility Assessment (last 72 hours)   ? ? Mobility Assessment   ? ? Row Name 11/01/21 0730 10/31/21 0730 10/30/21 1128 10/29/21 2100 10/29/21 0808  ? Does patient have an order for bedrest or is patient medically unstable No - Continue assessment No - Continue assessment No - Continue assessment No - Continue assessment No - Continue assessment  ? What is the highest level of mobility based on the progressive mobility assessment? Level 6 (Walks independently in room and hall) - Balance while walking in room without assist - Complete Level 6 (Walks independently in room and hall) - Balance while walking in room without assist - Complete Level 6 (Walks independently in room and hall) - Balance while walking in room without assist - Complete Level 6 (Walks independently in room and hall) - Balance while walking in room without assist - Complete Level 5 (Walks with assist in room/hall) - Balance while stepping forward/back and can walk in room with assist - Complete  ? ?  ?  ? ?  ? ? ? ?Mixed hyperlipidemia ?Continue lipitor ?Apparently has been nonadherent with medicines prior to admission ? ?COPD (chronic obstructive pulmonary disease) (HCC) ?Compensated ?Given Pneumovax immunization this admission ? ?  Constipation-resolved as of 11/02/2021 ?No bowel movement for several days ?Nursing states patient has been refusing preventative laxatives-as of 2/23 patient is agreeing to take stool softeners and laxative ?Give one-time dose of milk of magnesia to promote bowel movement ? ? ? ? ? ? ? ?Subjective:  ?Sleeping soundly today and did not awaken. ? ?Physical Exam: ?Vitals:  ? 11/28/21 0815 11/28/21 0824 11/28/21 1055 11/28/21 1104  ?BP: 139/63   (!) 137/92  ?Pulse: 65 68 80 86  ?Resp: 16   20  ?Temp:    98.4 ?F (36.9 ?C)   ?TempSrc:    Oral  ?SpO2: 100%   97%  ?Weight:      ?Height:      ? ?Gen: No acute distress, calm and sleeping sound ?Pulm: Stable on room air, posterior lung sounds clear.  Normal respiratory effort ?Cardiac: R

## 2021-11-29 DIAGNOSIS — E871 Hypo-osmolality and hyponatremia: Secondary | ICD-10-CM | POA: Diagnosis not present

## 2021-11-29 NOTE — Progress Notes (Addendum)
?Progress Note ? ? ?Patient: Jim Dunn VFI:[id number] DOB: 02-13-1966 DOA: 09/24/2021     63 ?DOS: the patient was seen and examined on 11/29/2021 ?  ?Brief hospital course: ?Jim Dunn is a 56 y.o. male with PMH significant for schizophrenia, chronic hyponatremia, seizure disorder, tobacco use, systolic congestive heart failure, COPD who was brought to ED on 09/24/2021 by his family after they were unable to take care of him.  He was drinking a lot of water and trying to burn things. Previously he was staying at group home and was kicked out.   ?Neurology and psychiatry were consulted.   ?Hospital course remarkable for acute on chronic hyponatremia, breakthrough seizures. ?Prolonged hospitalization.   ?TOC following.  ?APS in process to determine if they will pursue guardianship ?  ? ?Patient's behaviors have been stable.  Process was put in place to allow patient to briefly leave the floor given long length of stay.  Criteria were as follows: Must notify the nurse when he was leaving the floor, would not be allowed to leave the floor for longer than 30 minutes and he was not allowed to leave hospital grounds.  If any 1 of these criteria were broken off unit privileges would be revoked.  It is noted that eventual discharge plan would be to a group home although 24/7 monitoring of fluid intake would be difficult in that setting. ? ?On the evening of 2/26 patient developed a grand mal seizure and it was noted his sodium had decreased significantly to 117.  It was felt that patient had been drinking fluids in his room and off the unit leading to hypervolemic hyponatremia.  He has a history of polydipsia and has had seizures related to this in the past.  He was placed on hypertonic saline and transferred to the ICU service.  Of note patient had been on Lasix, total fluid restriction versus only free water restriction and salt tablets.  As of now Lasix has been discontinued.  Fluid restriction pertains to only free water  and prior to ICU team assuming care orders changed to allow pt to consume Gatorade and other electrolyte-based fluids to help with excessive thirst. ? ? ? ? 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  ?2/26 hyponatremic related seizure with nadir all sodium 116.  Current sodium stable at 132 ?Continue to hold Lasix.  Restriction will focus on free water only (including coffee).  Patient is allowed electrolyte-based fluids such as Gatorade as well as juices and milk that do not count towards the fluid restriction ? ?@@Please  do not dc his xanax-this drug has been extremely helpful in the management of his psychogenic polydipsia both during this admission and during the previuos admission when I cared for him@@ ? ? ? ?Seizure disorder (HCC) ?Continue Depakote-History of nonadherence and valproic acid on 1/22 was 19 ?MRI: arachnoid cyst that is chronic in nature ?S/p grand mal seizure with postictal phase on the evening of 2/26 presumed to be precipitated/provoked by abrupt hyponatremia with sodium less than 120.  Valproic acid level 58 ? ?Elevated CK-resolved as of 11/25/2021 ?Resolved ? ?Paranoid schizophrenia with hallucinations ?Schizophrenia decompensation worsened by recurrent hyponatremia at time of admission ?Continue Seroquel and Invega ?Continue Xanax noting this is helpful for his psychogenic polydipsia ?Off unit privileges accompanied by staff only ? ? ?Chronic systolic CHF (  congestive heart failure) (HCC) ?Continue digoxin and Entresto  ?Lasix discontinued in context of recurrent hyponatremia.  Previously had used as needed weight gain ?Digoxin level subtherapeutic as of 2/10 but this also appears to be a chronic issue.  Follow periodically ?Strict I/O and daily weights ?Fluid restriction for chronic hyponatremia  for free water primarily-allowing gatorade and other electrolyte based fluids to not be included in FR ?Last echo: 5/21 with EF of 25-30% with global hypokinesis, RV overload and indeterminate diastolic dysfunction. Does not appear that he has followed up with cardiology. [name]  ?Most recent echocardiogram performed on 09/26/2021 EF of 30-35% with moderately decreased systolic function and global hypokinesis. RV size and systolic function is normal. ?Weight measurements have been inconsistent and appear to be inaccurate ? ?Dementia without behavioral disturbance (HCC) ?During initial admission over 1 year ago had formal neuropsychiatric evaluation by Dr. Kieth Brightly who diagnosed patient with dementia likely related to years of schizophrenia and nonadherence to medication regimen ?SLUMS evaluation worsened since previous admission noting has declined from 18 to 12.  He lacks capacity to make decisions ?Hospital legal team assisting with guardianship process.  Hearing planned for 01/25/2022 ?Vistaril ineffective for anxiety therefore Xanax initiated.  Please see documentation and other problems regarding rationale for using this medication ? ? ? ?Peripheral neuropathy ?Chronic issue and was present during previous admission ?Likely related to years of substance abuse ?Continue low-dose Lyrica since was effective previous ? ?A physical therapy consult is indicated based on the patient?s mobility assessment. ? ? ?Mobility Assessment (last 72 hours)   ? ? Mobility Assessment   ? ? Row Name 11/01/21 0730 10/31/21 0730 10/30/21 1128 10/29/21 2100 10/29/21 0808  ? Does patient have an order for bedrest or is patient medically unstable No - Continue assessment No - Continue assessment No - Continue assessment No - Continue assessment No - Continue assessment  ? What is the highest level of mobility based on the progressive mobility assessment? Level 6 (Walks independently in room and hall) - Balance while walking in room without  assist - Complete Level 6 (Walks independently in room and hall) - Balance while walking in room without assist - Complete Level 6 (Walks independently in room and hall) - Balance while walking in room without assist - Complete Level 6 (Walks independently in room and hall) - Balance while walking in room without assist - Complete Level 5 (Walks with assist in room/hall) - Balance while stepping forward/back and can walk in room with assist - Complete  ? ?  ?  ? ?  ? ? ? ?Mixed hyperlipidemia ?Continue lipitor ?Apparently has been nonadherent with medicines prior to admission ? ?COPD (chronic obstructive pulmonary disease) (HCC) ?Compensated ?Given Pneumovax immunization this admission ? ?Constipation-resolved as of 11/02/2021 ?No bowel movement for several days ?Nursing states patient has been refusing preventative laxatives-as of 2/23 patient is agreeing to take stool softeners and laxative ?Give one-time dose of milk of magnesia to promote bowel movement ? ? ? ? ? ? ? ?Subjective:  ?Patient awake.  No specific complaints.  Had questions once again regarding disposition and upcoming guardianship hearing. ? ?Physical Exam: ?Vitals:  ? 11/29/21 0422 11/29/21 0850 11/29/21 0851 11/29/21 1117  ?BP:  (!) 147/87 (!) 147/87 (!) 182/98  ?Pulse:  76 76 70  ?Resp:  18  18  ?Temp:  98 ?F (36.7 ?C)  98.3 ?F (36.8 ?C)  ?TempSrc:  Oral    ?SpO2:  99%  100%  ?Weight: 109.3 kg     ?  Height:      ? ?Gen: No acute distress, calm ?Pulm: Stable on room air, posterior lung sounds clear.  Normal respiratory effort ?Cardiac: Regular pulse,S1-S2, no peripheral edema, normotensive ?Abd: Soft nontender, nondistended, normoactive bowel sounds. LBM 3/27 ?Neuro: Currently sleeping but at baseline cranial nerves II through XII grossly intact, moves all extremities x4 without any focal neurological deficits.  Sensation intact.  Mobilizes independently ?Psych: Alert and oriented.  Pleasant affect. ? ? ? ?Data Reviewed: ? ?Results reviewed ? ?Family  Communication:  ?Patient ? ?Disposition: ?Remains inpatient appropriate because:  ?Unsafe DC plan ? ?Planned Discharge Destination:  ?Barriers to discharge: APS to determine if they will pursue guardi

## 2021-11-29 NOTE — Progress Notes (Signed)
CSW spoke with patient at bedside to inform him of court date information. Patient states his mother may be willing to sign him into a group home prior to the court date. ? ?CSW attempted to reach patient's mom without success.  ? ?CSW spoke with patient's sister [name] who states she will discuss forfieting the patient's check with her mother and provide CSW with final answer. [name] and patient's mother are not willing to make other decisions for the patient and do not want to pursue guardianship of him. ? ?[name], MSW, LCSW ?Transitions of Care  Clinical Social Worker II ?[phone] ? ?

## 2021-11-29 NOTE — [place]2 (Signed)
?[place] MEDICAID FL2 LEVEL OF CARE SCREENING TOOL  ?  ? ?IDENTIFICATION  ?Patient Name: ?Jim Dunn Birthdate: [birth year] Sex: male Admission Date (Current Location): ?09/24/2021  ?[place] Number: ? [place] ?  Facility and Address:  ?[facility], [address], [place], [place] [address] ?     Provider Number: ?[id number]  ?Attending Physician Name and Address:  ?Leroy Sea, MD ? Relative Name and Phone Number:  ?  ?   ?Current Level of Care: ?Hospital Recommended Level of Care: ?Family Care Home, Assisted Living Facility (Group Home) Prior Approval Number: ?  ? ?Date Approved/Denied: ?  PASRR Number: ?  ? ?Discharge Plan: ?Other (Comment) ([facility], ALF, or GH) ?  ? ?Current Diagnoses: ?Patient Active Problem List  ? Diagnosis Date Noted  ? Hyponatremia   ? Schizophrenia (HCC)   ? Peripheral neuropathy 10/20/2021  ? Dementia without behavioral disturbance (HCC) 09/25/2021  ? Needs assistance with community resources 09/12/2021  ? Disorganized schizophrenia (HCC)   ? Dementia (HCC) 06/08/2020  ? 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  ? Chronic post-traumatic stress disorder (PTSD)   ? Noncompliance with medications   ? Subtherapeutic serum dilantin level   ? Seizure (HCC)   ? Hypomagnesemia 02/23/2020  ? Suicidal ideation 02/23/2020  ? Paranoid schizophrenia with hallucinations   ? Hypokalemia   ? Chronic systolic CHF (congestive heart failure) (HCC) 01/22/2020  ? Mixed hyperlipidemia 01/22/2020  ? COPD (chronic obstructive pulmonary disease) (HCC) 01/22/2020  ? Microcytic anemia 01/22/2020  ? 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 Seizure disorder (HCC) 01/22/2020  ? ? ?Orientation RESPIRATION BLADDER Height & Weight   ?  ?Self, Time, Situation, Place ? Normal Continent Weight: 240 lb 15.4 oz (109.3 kg) ?Height:  5\' 8"  (172.7 cm)  ?BEHAVIORAL SYMPTOMS/MOOD NEUROLOGICAL BOWEL NUTRITION STATUS  ?  Convulsions/Seizures  (Psychogenic polydipsia) Continent Diet  ?AMBULATORY STATUS COMMUNICATION OF NEEDS Skin   ?Independent Verbally Normal ?  ?  ?  ?    ?     ?     ? ? ?Personal Care Assistance Level of Assistance  ?Bathing, Dressing, Feeding Bathing Assistance: Independent ?Feeding assistance: Independent ?Dressing Assistance: Independent ?   ? ?Functional Limitations Info  ?Sight, Speech, Hearing Sight Info: Adequate ?Hearing Info: Adequate ?Speech Info: Adequate  ? ? ?SPECIAL CARE FACTORS FREQUENCY  ?    ?  ?  ?  ?  ?  ?  ?   ? ? ?Contractures Contractures Info: Not present  ? ? ?Additional Factors Info  ?Code Status, Allergies, Psychotropic Code Status Info: Full Code ?Allergies Info: No known allergies ?Psychotropic Info: Remeron, Xanax, Seroquel, Depakote ?  ?  ?   ? ?Current Medications (11/29/2021):  This is the current hospital active medication list ?Current Facility-Administered Medications  ?Medication Dose Route Frequency Provider Last Rate Last Admin  ? acetaminophen (TYLENOL) tablet 650 mg  650 mg Oral Q6H PRN 12/01/2021, MD   650 mg at 11/27/21 1118  ? Or  ? acetaminophen (TYLENOL) suppository 650 mg  650 mg Rectal Q6H PRN 11/29/21, MD      ? ALPRAZolam Lupita Leash) tablet 0.5 mg  0.5 mg Oral BID Prudy Feeler, NP   0.5 mg at 11/29/21 0947  ? atorvastatin (LIPITOR) tablet 40 mg  40 mg Oral Daily 12/01/21 B, MD   40 mg at 11/29/21 0947  ? carvedilol (COREG) tablet 12.5 mg  12.5 mg Oral  BID WC Leroy Sea, MD   12.5 mg at 11/29/21 6948  ? digoxin (LANOXIN) tablet 0.25 mg  0.25 mg Oral Daily Max Fickle B, MD   0.25 mg at 11/29/21 0947  ? divalproex (DEPAKOTE ER) 24 hr tablet 500 mg  500 mg Oral BID Max Fickle B, MD   500 mg at 11/29/21 5462  ? haloperidol lactate (HALDOL) injection 2 mg  2 mg Intramuscular Q6H PRN Max Fickle B, MD   2 mg at 11/17/21 2327  ? hydrALAZINE (APRESOLINE) tablet 25 mg  25 mg Oral Q6H PRN Pokhrel, Laxman, MD      ? ipratropium-albuterol (DUONEB)  0.5-2.5 (3) MG/3ML nebulizer solution 3 mL  3 mL Nebulization Q4H PRN Max Fickle B, MD      ? LORazepam (ATIVAN) injection 2 mg  2 mg Intravenous Q5 Min x 2 PRN Max Fickle B, MD      ? melatonin tablet 3 mg  3 mg Oral QHS Max Fickle B, MD   3 mg at 11/28/21 2137  ? mirtazapine (REMERON) tablet 15 mg  15 mg Oral QHS Max Fickle B, MD   15 mg at 11/28/21 2137  ? nicotine (NICODERM CQ - dosed in mg/24 hours) patch 14 mg  14 mg Transdermal Daily Hughie Closs, MD   14 mg at 11/29/21 0946  ? paliperidone (INVEGA) 24 hr tablet 3 mg  3 mg Oral Daily Lupita Leash, MD   3 mg at 11/29/21 0947  ? polyethylene glycol (MIRALAX / GLYCOLAX) packet 17 g  17 g Oral Daily PRN Dahal, Binaya, MD      ? pregabalin (LYRICA) capsule 25 mg  25 mg Oral TID Lupita Leash, MD   25 mg at 11/29/21 0947  ? QUEtiapine (SEROQUEL) tablet 50 mg  50 mg Oral QHS Lupita Leash, MD   50 mg at 11/28/21 2137  ? sacubitril-valsartan (ENTRESTO) 24-26 mg per tablet  1 tablet Oral BID Lupita Leash, MD   1 tablet at 11/29/21 0947  ? senna-docusate (Senokot-S) tablet 2 tablet  2 tablet Oral BID Lupita Leash, MD   2 tablet at 11/29/21 0947  ? sodium chloride tablet 2 g  2 g Oral BID WC Lupita Leash, MD   2 g at 11/29/21 7035  ? ? ? ?Discharge Medications: ?Please see discharge summary for a list of discharge medications. ? ?Relevant Imaging Results: ? ?Relevant Lab Results: ? ? ?Additional Information ?SSN: [id number] ? ?Inis Sizer, LCSW ? ? ? ? ?

## 2021-11-30 DIAGNOSIS — E871 Hypo-osmolality and hyponatremia: Secondary | ICD-10-CM | POA: Diagnosis not present

## 2021-11-30 NOTE — Progress Notes (Signed)
Smoke smelled coming from patient's room. Nurse entered room, patient lying in bed denying having any cigarettes. Patient gave night nurse lighter. Patient educated on safety and no tobacco policy. ? ?Melony Overly, RN   ?

## 2021-11-30 NOTE — Progress Notes (Signed)
?Progress Note ? ? ?Patient: Jim Dunn EHO:[id number] DOB: 1966/02/22 DOA: 09/24/2021     56 ?DOS: the patient was seen and examined on 11/30/2021 ?  ?Brief hospital course: ?Jim Dunn is a 56 y.o. male with PMH significant for schizophrenia, chronic hyponatremia, seizure disorder, tobacco use, systolic congestive heart failure, COPD who was brought to ED on 09/24/2021 by his family after they were unable to take care of him.  He was drinking a lot of water and trying to burn things. Previously he was staying at group home and was kicked out.   ?Neurology and psychiatry were consulted.   ?Hospital course remarkable for acute on chronic hyponatremia, breakthrough seizures. ?Prolonged hospitalization.   ?TOC following.  ?APS in process to determine if they will pursue guardianship ?  ? ?Patient's behaviors have been stable.  Process was put in place to allow patient to briefly leave the floor given long length of stay.  Criteria were as follows: Must notify the nurse when he was leaving the floor, would not be allowed to leave the floor for longer than 30 minutes and he was not allowed to leave hospital grounds.  If any 1 of these criteria were broken off unit privileges would be revoked.  It is noted that eventual discharge plan would be to a group home although 24/7 monitoring of fluid intake would be difficult in that setting. ? ?On the evening of 2/26 patient developed a grand mal seizure and it was noted his sodium had decreased significantly to 117.  It was felt that patient had been drinking fluids in his room and off the unit leading to hypervolemic hyponatremia.  He has a history of polydipsia and has had seizures related to this in the past.  He was placed on hypertonic saline and transferred to the ICU service.  Of note patient had been on Lasix, total fluid restriction versus only free water restriction and salt tablets.  As of now Lasix has been discontinued.  Fluid restriction pertains to only free water  and prior to ICU team assuming care orders changed to allow pt to consume Gatorade and other electrolyte-based fluids to help with excessive thirst. ? ? ? ? 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  ?2/26 hyponatremic related seizure with nadir all sodium 116.  Current sodium stable at 132 ?Continue to hold Lasix.  Restriction will focus on free water only (including coffee).  Patient is allowed electrolyte-based fluids such as Gatorade as well as juices and milk that do not count towards the fluid restriction ? ?@@Please  do not dc his xanax-this drug has been extremely helpful in the management of his psychogenic polydipsia both during this admission and during the previuos admission when I cared for him@@ ? ? ? ?Seizure disorder (HCC) ?Continue Depakote-History of nonadherence and valproic acid on 1/22 was 19 ?MRI: arachnoid cyst that is chronic in nature ?S/p grand mal seizure with postictal phase on the evening of 2/26 presumed to be precipitated/provoked by abrupt hyponatremia with sodium less than 120.  Valproic acid level 58 ? ?Elevated CK-resolved as of 11/25/2021 ?Resolved ? ?Paranoid schizophrenia with hallucinations ?Schizophrenia decompensation worsened by recurrent hyponatremia at time of admission ?Continue Seroquel and Invega ?Continue Xanax noting this is helpful for his psychogenic polydipsia ?Off unit privileges accompanied by staff only ? ? ?Chronic systolic CHF (  congestive heart failure) (HCC) ?Continue digoxin and Entresto  ?Lasix discontinued in context of recurrent hyponatremia.  Previously had used as needed weight gain ?Digoxin level subtherapeutic as of 2/10 but this also appears to be a chronic issue.  Follow periodically ?Strict I/O and daily weights ?Fluid restriction for chronic hyponatremia  for free water primarily-allowing gatorade and other electrolyte based fluids to not be included in FR ?Last echo: 5/21 with EF of 25-30% with global hypokinesis, RV overload and indeterminate diastolic dysfunction. Does not appear that he has followed up with cardiology. [name]  ?Most recent echocardiogram performed on 09/26/2021 EF of 30-35% with moderately decreased systolic function and global hypokinesis. RV size and systolic function is normal. ?Weight measurements have been inconsistent and appear to be inaccurate ? ?Dementia without behavioral disturbance (HCC) ?During initial admission over 1 year ago had formal neuropsychiatric evaluation by Dr. Kieth Brightly who diagnosed patient with dementia likely related to years of schizophrenia and nonadherence to medication regimen ?SLUMS evaluation worsened since previous admission noting has declined from 18 to 12.  He lacks capacity to make decisions ?Hospital legal team assisting with guardianship process.  Hearing planned for 01/25/2022 ?Vistaril ineffective for anxiety therefore Xanax initiated.  Please see documentation and other problems regarding rationale for using this medication ? ? ? ?Peripheral neuropathy ?Chronic issue and was present during previous admission ?Likely related to years of substance abuse ?Continue low-dose Lyrica since was effective previous ? ?A physical therapy consult is indicated based on the patient?s mobility assessment. ? ? ?Mobility Assessment (last 72 hours)   ? ? Mobility Assessment   ? ? Row Name 11/01/21 0730 10/31/21 0730 10/30/21 1128 10/29/21 2100 10/29/21 0808  ? Does patient have an order for bedrest or is patient medically unstable No - Continue assessment No - Continue assessment No - Continue assessment No - Continue assessment No - Continue assessment  ? What is the highest level of mobility based on the progressive mobility assessment? Level 6 (Walks independently in room and hall) - Balance while walking in room without  assist - Complete Level 6 (Walks independently in room and hall) - Balance while walking in room without assist - Complete Level 6 (Walks independently in room and hall) - Balance while walking in room without assist - Complete Level 6 (Walks independently in room and hall) - Balance while walking in room without assist - Complete Level 5 (Walks with assist in room/hall) - Balance while stepping forward/back and can walk in room with assist - Complete  ? ?  ?  ? ?  ? ? ? ?Mixed hyperlipidemia ?Continue lipitor ?Apparently has been nonadherent with medicines prior to admission ? ?COPD (chronic obstructive pulmonary disease) (HCC) ?Compensated ?Given Pneumovax immunization this admission ? ?Constipation-resolved as of 11/02/2021 ?No bowel movement for several days ?Nursing states patient has been refusing preventative laxatives-as of 2/23 patient is agreeing to take stool softeners and laxative ?Give one-time dose of milk of magnesia to promote bowel movement ? ? ? ? ? ? ? ?Subjective:  ?Laying in bed again trying to assemble and model car.  No complaints.  Continues to have questions regarding disposition ? ?Physical Exam: ?Vitals:  ? 11/29/21 2117 11/30/21 0036 11/30/21 0355 11/30/21 7579  ?BP: (!) 162/91 (!) 164/90 (!) 142/84 (!) 176/96  ?Pulse:  67 60 69  ?Resp:  20 17   ?Temp:  98.6 ?F (37 ?C) 98.8 ?F (37.1 ?C)   ?TempSrc:  Oral Oral   ?SpO2:  98% 99%   ?  Weight:      ?Height:      ? ?Gen: No acute distress, calm ?Pulm: Stable on room air, posterior lung sounds clear.  Respiratory effort normal ?Cardiac: Regular pulse,S1-S2, no peripheral edema, normotensive ?Abd: Soft nontender, nondistended, normoactive bowel sounds. LBM 3/28 ?Neuro: Currently sleeping but at baseline cranial nerves II through XII grossly intact, ambulates independently sensation intact.   ?Psych: Alert and oriented.  Pleasant affect. ? ? ? ?Data Reviewed: ? ?Results reviewed ? ?Family Communication:  ?Patient ? ?Disposition: ?Remains inpatient  appropriate because:  ?Unsafe DC plan ? ?Planned Discharge Destination:  ?Barriers to discharge: APS to determine if they will pursue guardianship. Family refusing to take pt because stating they cannot

## 2021-12-01 DIAGNOSIS — E871 Hypo-osmolality and hyponatremia: Secondary | ICD-10-CM | POA: Diagnosis not present

## 2021-12-01 NOTE — Progress Notes (Signed)
?Progress Note ? ? ?PatientZekhi Dunn XFQ:[id number] DOB: [birth year] DOA: 09/24/2021     63 ?DOS: the patient was seen and examined on 12/01/2021 ?  ?Brief hospital course: ?Jim Dunn is a 56 y.o. male with PMH significant for schizophrenia, chronic hyponatremia, seizure disorder, tobacco use, systolic congestive heart failure, COPD who was brought to ED on 09/24/2021 by his family after they were unable to take care of him.  He was drinking a lot of water and trying to burn things. Previously he was staying at group home and was kicked out.   ?Neurology and psychiatry were consulted.   ?Hospital course remarkable for acute on chronic hyponatremia, breakthrough seizures. ?Prolonged hospitalization.   ?TOC following.  ?APS in process to determine if they will pursue guardianship ?  ? ?Patient's behaviors have been stable.  Process was put in place to allow patient to briefly leave the floor given long length of stay.  Criteria were as follows: Must notify the nurse when he was leaving the floor, would not be allowed to leave the floor for longer than 30 minutes and he was not allowed to leave hospital grounds.  If any 1 of these criteria were broken off unit privileges would be revoked.  It is noted that eventual discharge plan would be to a group home although 24/7 monitoring of fluid intake would be difficult in that setting. ? ?On the evening of 2/26 patient developed a grand mal seizure and it was noted his sodium had decreased significantly to 117.  It was felt that patient had been drinking fluids in his room and off the unit leading to hypervolemic hyponatremia.  He has a history of polydipsia and has had seizures related to this in the past.  He was placed on hypertonic saline and transferred to the ICU service.  Of note patient had been on Lasix, total fluid restriction versus only free water restriction and salt tablets.  As of now Lasix has been discontinued.  Fluid restriction pertains to only free water  and prior to ICU team assuming care orders changed to allow pt to consume Gatorade and other electrolyte-based fluids to help with excessive thirst. ? ? ? ? 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  Current sodium stable at 132 ?Continue to hold Lasix.  Restriction will focus on free water only (including coffee).  Patient is allowed electrolyte-based fluids such as Gatorade as well as juices and milk that do not count towards the fluid restriction ? ?@@Please  do not dc his xanax-this drug has been extremely helpful in the management of his psychogenic polydipsia both during this admission and during the previuos admission when I cared for him@@ ? ? ? ?Seizure disorder (HCC) ?Continue Depakote-History of nonadherence in the outpatient setting.  Valproic acid on 1/22 was 19 ?MRI: arachnoid cyst that is chronic in nature ?S/p grand mal seizure 2/26 presumed to be precipitated/provoked by abrupt hyponatremia with sodium less than 120.  Valproic acid level 58 ? ?Elevated CK-resolved as of 11/25/2021 ?Resolved ? ?Paranoid schizophrenia with hallucinations ?Schizophrenia decompensation worsened by recurrent hyponatremia at time of admission ?Continue Seroquel and Invega ?Continue Xanax noting this is helpful for his psychogenic polydipsia ?Off unit privileges accompanied by staff only ? ? ?Chronic systolic CHF (congestive heart failure) (HCC) ?Continue digoxin and Entresto  ?Lasix discontinued  in context of recurrent hyponatremia.  Previously had used as needed weight gain ?Digoxin level subtherapeutic as of 2/10 but this also appears to be a chronic issue.  Follow periodically ?Strict I/O and daily weights ?Fluid restriction for chronic hyponatremia for free water primarily-allowing gatorade and  other electrolyte based fluids to not be included in FR ?Last echo: 5/21 with EF of 25-30% with global hypokinesis, RV overload and indeterminate diastolic dysfunction. Does not appear that he has followed up with cardiology. [name]  ?Most recent echocardiogram performed on 09/26/2021 EF of 30-35% with moderately decreased systolic function and global hypokinesis. RV size and systolic function is normal. ?Weight measurements have been inconsistent and appear to be inaccurate ? ?Dementia without behavioral disturbance (HCC) ?During initial admission over 1 year ago had formal neuropsychiatric evaluation by Dr. Kieth Brightly who diagnosed patient with dementia likely related to years of schizophrenia and nonadherence to medication regimen ?SLUMS evaluation worsened since previous admission noting has declined from 18 to 12.  He lacks capacity to make decisions ?Hospital legal team assisting with guardianship process.  Hearing planned for 01/25/2022 ?Vistaril ineffective for anxiety therefore Xanax initiated.  Please see documentation and other problems regarding rationale for using this medication ? ? ? ?Peripheral neuropathy ?Chronic issue and was present during previous admission ?Likely related to years of substance abuse ?Continue low-dose Lyrica since was effective previous ? ?A physical therapy consult is indicated based on the patient?s mobility assessment. ? ? ?Mobility Assessment (last 72 hours)   ? ? Mobility Assessment   ? ? Row Name 11/01/21 0730 10/31/21 0730 10/30/21 1128 10/29/21 2100 10/29/21 0808  ? Does patient have an order for bedrest or is patient medically unstable No - Continue assessment No - Continue assessment No - Continue assessment No - Continue assessment No - Continue assessment  ? What is the highest level of mobility based on the progressive mobility assessment? Level 6 (Walks independently in room and hall) - Balance while walking in room without assist - Complete Level 6 (Walks independently  in room and hall) - Balance while walking in room without assist - Complete Level 6 (Walks independently in room and hall) - Balance while walking in room without assist - Complete Level 6 (Walks independently in room and hall) - Balance while walking in room without assist - Complete Level 5 (Walks with assist in room/hall) - Balance while stepping forward/back and can walk in room with assist - Complete  ? ?  ?  ? ?  ? ? ? ?Mixed hyperlipidemia ?Continue lipitor ?Apparently has been nonadherent with medicines prior to admission ? ?COPD (chronic obstructive pulmonary disease) (HCC) ?Compensated ?Given Pneumovax immunization this admission ? ?Constipation-resolved as of 11/02/2021 ?No bowel movement for several days ?Nursing states patient has been refusing preventative laxatives-as of 2/23 patient is agreeing to take stool softeners and laxative ?Give one-time dose of milk of magnesia to promote bowel movement ? ? ? ? ? ? ? ?Subjective:  ?Staff documented that they smelled smoke coming from patient's room during the evening hours and documented that he had been smoking.  When I talked to Mr. Stencil about this he admitted he had been smoking.  Counseled him against smoking in his room due to the dangers related to oxygen.  Also reminded him that the hospital policy is no smoking on campus. ? ?Physical Exam: ?Vitals:  ? 11/30/21 2346 12/01/21 0322 12/01/21 0748 12/01/21 0957  ?BP: 123/70 104/67 125/66   ?Pulse: 85 67 61 67  ?Resp:  16 16 18    ?Temp: 97.8 ?F (36.6 ?C) 97.6 ?F (36.4 ?C) 98.2 ?F (36.8 ?C)   ?TempSrc: Oral Oral    ?SpO2: 95% 97% 100%   ?Weight:      ?Height:      ? ?Gen: No acute distress, calm ?Pulm: Stable on room air, posterior lung sounds clear.  Respiratory effort normal ?Cardiac: Regular pulse,S1-S2, no peripheral edema, normotensive ?Abd: Soft nontender, nondistended, normoactive bowel sounds. LBM 3/28 ?Neuro: Currently sleeping but at baseline cranial nerves II through XII grossly intact, ambulates  independently sensation intact.   ?Psych: Alert and oriented.  Pleasant affect. ? ? ? ?Data Reviewed: ? ?Results reviewed ? ?Family Communication:  ?Patient ? ?Disposition: ?Remains inpatient appropriate

## 2021-12-02 DIAGNOSIS — E871 Hypo-osmolality and hyponatremia: Secondary | ICD-10-CM | POA: Diagnosis not present

## 2021-12-02 LAB — CBC WITH DIFFERENTIAL/PLATELET
Abs Immature Granulocytes: 0.03 10*3/uL (ref 0.00–0.07)
Basophils Absolute: 0 10*3/uL (ref 0.0–0.1)
Basophils Relative: 0 %
Eosinophils Absolute: 0.2 10*3/uL (ref 0.0–0.5)
Eosinophils Relative: 3 %
HCT: 36 % — ABNORMAL LOW (ref 39.0–52.0)
Hemoglobin: 12.4 g/dL — ABNORMAL LOW (ref 13.0–17.0)
Immature Granulocytes: 0 %
Lymphocytes Relative: 56 %
Lymphs Abs: 4 10*3/uL (ref 0.7–4.0)
MCH: 23.4 pg — ABNORMAL LOW (ref 26.0–34.0)
MCHC: 34.4 g/dL (ref 30.0–36.0)
MCV: 67.8 fL — ABNORMAL LOW (ref 80.0–100.0)
Monocytes Absolute: 0.9 10*3/uL (ref 0.1–1.0)
Monocytes Relative: 13 %
Neutro Abs: 2 10*3/uL (ref 1.7–7.7)
Neutrophils Relative %: 28 %
Platelets: 151 10*3/uL (ref 150–400)
RBC: 5.31 MIL/uL (ref 4.22–5.81)
RDW: 16.5 % — ABNORMAL HIGH (ref 11.5–15.5)
WBC: 7.2 10*3/uL (ref 4.0–10.5)
nRBC: 0 % (ref 0.0–0.2)

## 2021-12-02 LAB — COMPREHENSIVE METABOLIC PANEL
ALT: 12 U/L (ref 0–44)
AST: 16 U/L (ref 15–41)
Albumin: 3.6 g/dL (ref 3.5–5.0)
Alkaline Phosphatase: 62 U/L (ref 38–126)
Anion gap: 6 (ref 5–15)
BUN: 11 mg/dL (ref 6–20)
CO2: 27 mmol/L (ref 22–32)
Calcium: 8.8 mg/dL — ABNORMAL LOW (ref 8.9–10.3)
Chloride: 98 mmol/L (ref 98–111)
Creatinine, Ser: 0.66 mg/dL (ref 0.61–1.24)
GFR, Estimated: >60 mL/min (ref >60)
Glucose, Bld: 99 mg/dL (ref 70–99)
Potassium: 4.2 mmol/L (ref 3.5–5.1)
Sodium: 131 mmol/L — ABNORMAL LOW (ref 135–145)
Total Bilirubin: 0.4 mg/dL (ref 0.3–1.2)
Total Protein: 6 g/dL — ABNORMAL LOW (ref 6.5–8.1)

## 2021-12-02 NOTE — Progress Notes (Signed)
?Progress Note ? ? ?Patient: Jim Dunn HQR:[id number] DOB: [birth year] DOA: 09/24/2021     56 ?DOS: the patient was seen and examined on 12/02/2021 ?  ?Brief hospital course: ?Harless Molinari is a 56 y.o. male with PMH significant for schizophrenia, chronic hyponatremia, seizure disorder, tobacco use, systolic congestive heart failure, COPD who was brought to ED on 09/24/2021 by his family after they were unable to take care of him.  He was drinking a lot of water and trying to burn things. Previously he was staying at group home and was kicked out.   ?Neurology and psychiatry were consulted.   ?Hospital course remarkable for acute on chronic hyponatremia, breakthrough seizures. ?Prolonged hospitalization.   ?TOC following.  ?APS in process to determine if they will pursue guardianship ?  ? ?Patient's behaviors have been stable.  Process was put in place to allow patient to briefly leave the floor given long length of stay.  Criteria were as follows: Must notify the nurse when he was leaving the floor, would not be allowed to leave the floor for longer than 30 minutes and he was not allowed to leave hospital grounds.  If any 1 of these criteria were broken off unit privileges would be revoked.  It is noted that eventual discharge plan would be to a group home although 24/7 monitoring of fluid intake would be difficult in that setting. ? ?On the evening of 2/26 patient developed a grand mal seizure and it was noted his sodium had decreased significantly to 117.  It was felt that patient had been drinking fluids in his room and off the unit leading to hypervolemic hyponatremia.  He has a history of polydipsia and has had seizures related to this in the past.  He was placed on hypertonic saline and transferred to the ICU service.  Of note patient had been on Lasix, total fluid restriction versus only free water restriction and salt tablets.  As of now Lasix has been discontinued.  Fluid restriction pertains to only free water  and prior to ICU team assuming care orders changed to allow pt to consume Gatorade and other electrolyte-based fluids to help with excessive thirst. ? ? ? ? 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  Current sodium stable at 131 ?Continue to hold Lasix.  Restriction will focus on free water only (including coffee).  Patient is allowed electrolyte-based fluids such as Gatorade as well as juices and milk that do not count towards the fluid restriction ? ?@@Please  do not dc his xanax-this drug has been extremely helpful in the management of his psychogenic polydipsia both during this admission and during the previuos admission when I cared for him@@ ? ? ? ?Seizure disorder (HCC) ?Continue Depakote-History of nonadherence in the outpatient setting.  Valproic acid on 1/22 was 19 ?MRI: arachnoid cyst that is chronic in nature ?S/p grand mal seizure 2/26 presumed to be precipitated/provoked by abrupt hyponatremia with sodium less than 120.  Valproic acid level 58 ? ?Elevated CK-resolved as of 11/25/2021 ?Resolved ? ?Paranoid schizophrenia with hallucinations ?Schizophrenia decompensation worsened by recurrent hyponatremia at time of admission ?Continue Seroquel and Invega ?Continue Xanax noting this is helpful for his psychogenic polydipsia ?Off unit privileges accompanied by staff only ? ? ?Chronic systolic CHF (congestive heart failure) (HCC) ?Continue digoxin and Entresto  ?Lasix discontinued  in context of recurrent hyponatremia.  Previously had used as needed weight gain ?Digoxin level subtherapeutic as of 2/10 but this also appears to be a chronic issue.  Follow periodically ?Strict I/O and daily weights ?Fluid restriction for chronic hyponatremia for free water primarily-allowing gatorade and  other electrolyte based fluids to not be included in FR ?Last echo: 5/21 with EF of 25-30% with global hypokinesis, RV overload and indeterminate diastolic dysfunction. Does not appear that he has followed up with cardiology. [name]  ?Most recent echocardiogram performed on 09/26/2021 EF of 30-35% with moderately decreased systolic function and global hypokinesis. RV size and systolic function is normal. ?Weight measurements have been inconsistent and appear to be inaccurate ? ?Dementia without behavioral disturbance (HCC) ?During initial admission over 1 year ago had formal neuropsychiatric evaluation by Dr. Kieth Brightly who diagnosed patient with dementia likely related to years of schizophrenia and nonadherence to medication regimen ?SLUMS evaluation worsened since previous admission noting has declined from 18 to 12.  He lacks capacity to make decisions ?Hospital legal team assisting with guardianship process.  Hearing planned for 01/25/2022 ?Vistaril ineffective for anxiety therefore Xanax initiated.  Please see documentation and other problems regarding rationale for using this medication ? ? ? ?Peripheral neuropathy ?Chronic issue and was present during previous admission ?Likely related to years of substance abuse ?Continue low-dose Lyrica since was effective previous ? ?A physical therapy consult is indicated based on the patient?s mobility assessment. ? ? ?Mobility Assessment (last 72 hours)   ? ? Mobility Assessment   ? ? Row Name 11/01/21 0730 10/31/21 0730 10/30/21 1128 10/29/21 2100 10/29/21 0808  ? Does patient have an order for bedrest or is patient medically unstable No - Continue assessment No - Continue assessment No - Continue assessment No - Continue assessment No - Continue assessment  ? What is the highest level of mobility based on the progressive mobility assessment? Level 6 (Walks independently in room and hall) - Balance while walking in room without assist - Complete Level 6 (Walks independently  in room and hall) - Balance while walking in room without assist - Complete Level 6 (Walks independently in room and hall) - Balance while walking in room without assist - Complete Level 6 (Walks independently in room and hall) - Balance while walking in room without assist - Complete Level 5 (Walks with assist in room/hall) - Balance while stepping forward/back and can walk in room with assist - Complete  ? ?  ?  ? ?  ? ? ? ?Mixed hyperlipidemia ?Continue lipitor ?Apparently has been nonadherent with medicines prior to admission ? ?COPD (chronic obstructive pulmonary disease) (HCC) ?Compensated ?Given Pneumovax immunization this admission ? ?Constipation-resolved as of 11/02/2021 ?No bowel movement for several days ?Nursing states patient has been refusing preventative laxatives-as of 2/23 patient is agreeing to take stool softeners and laxative ?Give one-time dose of milk of magnesia to promote bowel movement ? ? ? ? ? ? ? ?Subjective:  ?No further episodes of patient smoking in room reported.  Patient currently lying on bed playing with phone. ? ?Physical Exam: ?Vitals:  ? 12/01/21 2304 12/02/21 0434 12/02/21 0905 12/02/21 1138  ?BP: (!) 145/87 106/74 (!) 166/88 (!) 156/88  ?Pulse: 79 68 68 65  ?Resp: 19 19 14 20   ?Temp: 98.4 ?F (36.9 ?C) 98.1 ?F (36.7 ?C) 98.2 ?F (36.8 ?C) 97.6 ?F (36.4 ?C)  ?TempSrc: Oral Oral Oral Oral  ?SpO2: 97% 98% 100% 99%  ?Weight:      ?Height:      ? ?  Gen: No acute distress, calm ?Pulm: Stable on room air, posterior lung sounds clear.  Respiratory effort normal ?Cardiac: Regular pulse,S1-S2, no peripheral edema, normotensive ?Abd: Soft nontender, nondistended, normoactive bowel sounds. LBM 3/28 ?Neuro: CN II through XII grossly intact, ambulates independently sensation intact.   ?Psych: Alert and oriented.  Pleasant affect. ? ? ? ?Data Reviewed: ? ?Results reviewed ? ?Family Communication:  ?Patient ? ?Disposition: ?Remains inpatient appropriate because:  ?Unsafe DC plan ? ?Planned  Discharge Destination:  ?Barriers to discharge: APS to determine if they will pursue guardianship. Family refusing to take pt because stating they cannot manage him noting refuses to take meds ? ?Medically st

## 2021-12-03 DIAGNOSIS — E871 Hypo-osmolality and hyponatremia: Secondary | ICD-10-CM | POA: Diagnosis not present

## 2021-12-03 NOTE — Progress Notes (Signed)
?                                  PROGRESS NOTE                                             ?                                                                                                                     ?                                         ? ? Patient Demographics:  ? ? Jim Dunn, is a 56 y.o. male, DOB - [birth year], RL:[id number] ? ?Outpatient Primary MD for the patient is Pcp, No    LOS - 73  Admit date - 09/24/2021   ? ?Chief Complaint  ?Patient presents with  ? Altered Mental Status  ?  Abnormal labs  ?    ? ?Brief Narrative (HPI from H&P)   56 y.o. male with PMH significant for schizophrenia, chronic hyponatremia, seizure disorder, tobacco use, systolic congestive heart failure, COPD who was brought to ED on 09/24/2021 by his family after they were unable to take care of him.  He was found to be hyponatremic he had symptoms suggestive of severe primary polydipsia.  He also had seizures.  He was seen by psych and neurology.  Currently on fluid restriction, still noncompliant with fluid restriction, he is undergoing legal guardianship and awaiting placement. ? ? Subjective:  ? ?Patient in bed, appears comfortable, denies any headache, no fever, no chest pain or pressure, no shortness of breath , no abdominal pain. No new focal weakness. ? ? Assessment  & Plan :  ? ? 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  Continue salt tablets, continue Xanax per psych. ? ?Dementia without behavioral disturbance ([place])  During initial admission over 1 year ago had formal neuropsychiatric evaluation by Dr. Sima Matas who diagnosed patient with dementia likely related to years of schizophrenia and nonadherence to medication regimen. SLUMS evaluation worsened since previous admission noting has declined from 18 to 12.  He lacks capacity to make  decisions With the assistance of APS interim already in ship has been completed since family not interested in taking on this role. Psych following undergoing legal guardianship evaluation and placement evaluation. ? ?Paranoid schizophrenia with hallucinations -  Schizophrenia symptoms and decompensation worsened by recurrent hyponatremia, Continue Seroquel and Invega, continue Xanax but increased from 0.25 to 0.5 mg since this will also help with tobacco cravings and in the past this is  helped with his psychogenic polydipsia. ? ?Seizure disorder ([place]) [place] due to hyponatremia.  Continue Depakote-History of nonadherence and valproic acid on 1/22 was 19, MRI revealed arachnoid cyst that is chronic in nature, last breakthrough seizure on 10/30/2021 due to hyponatremia.  Seen by neurology this admission ?  ?Chronic systolic CHF (congestive heart failure) (HCC) EF 30% on last echocardiogram in January 2023.  Continue Coreg, digoxin and Entresto, as needed Lasix.  No acute issue.  Require outpatient cardiology follow-up postdischarge. ?  ?COPD (chronic obstructive pulmonary disease) (HCC) - Compensated, given Pneumovax immunization this admission. ? ?Peripheral neuropathy - Chronic issue and was present during previous admission, likely related to years of substance abuse ?Continue low-dose Lyrica since was effective previous ? ?Hyperlipidemia -  Continue lipitor ?  ? ?   ? ?Condition - Fair ? ?Family Communication  :  None present ? ?Code Status :  Full ? ?Consults  :  Psych, Neurology ? ?PUD Prophylaxis :  ? ? Procedures  :    ? ?EEG ? ?TTE ? ?   ? ?Disposition Plan  :   ? ?Status is: Inpatient ? ?DVT Prophylaxis  :   ? ?Place and maintain sequential compression device Start: 11/01/21 1711 ? ?Lab Results  ?Component Value Date  ? PLT 151 12/02/2021  ? ? ?Diet :  ?Diet Order   ? ?       ?  Diet regular Room service appropriate? No; Fluid consistency: Thin; Fluid restriction: 1200 mL Fluid  Diet effective now       ?   ? ?  ?  ? ?  ?  ? ?Inpatient Medications ? ?Scheduled Meds: ? ALPRAZolam  0.5 mg Oral BID  ? atorvastatin  40 mg Oral Daily  ? carvedilol  12.5 mg Oral BID WC  ? digoxin  0.25 mg Oral Daily  ? divalproex  500 mg Oral BID  ? melatonin  3 mg Oral QHS  ? mirtazapine  15 mg Oral QHS  ? nicotine  14 mg Transdermal Daily  ? paliperidone  3 mg Oral Daily  ? pregabalin  25 mg Oral TID  ? QUEtiapine  50 mg Oral QHS  ? sacubitril-valsartan  1 tablet Oral BID  ? senna-docusate  2 tablet Oral BID  ? sodium chloride  2 g Oral BID WC  ? ?Continuous Infusions: ?PRN Meds:.acetaminophen **OR** acetaminophen, haloperidol lactate, hydrALAZINE, ipratropium-albuterol, LORazepam, polyethylene glycol ? ?Antibiotics  :   ? ?Anti-infectives (From admission, onward)  ? ? None  ? ?  ? ? ? Time Spent in minutes  30 ? ? ?Lala Lund M.D on 12/03/2021 at 9:29 AM ? ?To page go to [email or web address]  ? ?[facility] -  Office  [phone] ? ?See all Orders from today for further details ? ? ? Objective:  ? ?Vitals:  ? 12/02/21 1138 12/02/21 1925 12/02/21 2320 12/03/21 0351  ?BP: (!) 156/88 (!) 157/116 114/74 (!) 93/54  ?Pulse: 65 79 81 72  ?Resp: 20 20 18 18   ?Temp: 97.6 ?F (36.4 ?C) 99.2 ?F (37.3 ?C) 98.9 ?F (37.2 ?C) 98.1 ?F (36.7 ?C)  ?TempSrc: Oral Oral Oral   ?SpO2: 99% 96% 98% 97%  ?Weight:      ?Height:      ? ? ?Wt Readings from Last 3 Encounters:  ?11/29/21 109.3 kg  ?07/27/21 95.3 kg  ?07/22/20 98.2 kg  ? ? ? ?Intake/Output Summary (Last 24 hours) at 12/03/2021 0929 ?Last data filed at 12/02/2021 2200 ?Gross per 24 hour  ?  Intake 477 ml  ?Output --  ?Net 477 ml  ? ? ? ?Physical Exam ? ?Awake Alert, No new F.N deficits, Normal affect ?[place].AT,PERRAL ?Supple Neck, No JVD,   ?Symmetrical Chest wall movement, Good air movement bilaterally, CTAB ?RRR,No Gallops, Rubs or new Murmurs,  ?+ve B.Sounds, Abd Soft, No tenderness,   ?No Cyanosis, Clubbing or edema  ? ? ? ? Data Review:  ? ? ?CBC ?Recent Labs  ?Lab 12/02/21 ?0233  ?WBC 7.2  ?HGB 12.4*   ?HCT 36.0*  ?PLT 151  ?MCV 67.8*  ?MCH 23.4*  ?MCHC 34.4  ?RDW 16.5*  ?LYMPHSABS 4.0  ?MONOABS 0.9  ?EOSABS 0.2  ?BASOSABS 0.0  ? ? ?Electrolytes ?Recent Labs  ?Lab 11/27/21 ?0700 11/28/21 ?0645 12/02/21 ?0233  ?NA 132* 132* 131*  ?K 4.2 4.1 4.2  ?CL 100 99 98  ?CO2 24 27 27   ?GLUCOSE 86 91 99  ?BUN 11 10 11   ?CREATININE 0.57* 0.66 0.66  ?CALCIUM 9.2 9.0 8.8*  ?AST  --   --  16  ?ALT  --   --  12  ?ALKPHOS  --   --  62  ?BILITOT  --   --  0.4  ?ALBUMIN  --   --  3.6  ?MG 2.2  --   --   ? ? ? ?Micro Results ?No results found for this or any previous visit (from the past 240 hour(s)). ? ?Radiology Reports ?No results found.  ? ? ?

## 2021-12-04 DIAGNOSIS — E871 Hypo-osmolality and hyponatremia: Secondary | ICD-10-CM | POA: Diagnosis not present

## 2021-12-04 NOTE — Progress Notes (Signed)
?                                  PROGRESS NOTE                                             ?                                                                                                                     ?                                         ? ? Patient Demographics:  ? ? Jim Dunn, is a 56 y.o. male, DOB - 21-Sep-1965, [id number] ? ?Outpatient Primary MD for the patient is Pcp, No    LOS - 48  Admit date - 09/24/2021   ? ?Chief Complaint  ?Patient presents with  ? Altered Mental Status  ?  Abnormal labs  ?    ? ?Brief Narrative (HPI from H&P)   56 y.o. male with PMH significant for schizophrenia, chronic hyponatremia, seizure disorder, tobacco use, systolic congestive heart failure, COPD who was brought to ED on 09/24/2021 by his family after they were unable to take care of him.  He was found to be hyponatremic he had symptoms suggestive of severe primary polydipsia.  He also had seizures.  He was seen by psych and neurology.  Currently on fluid restriction, still noncompliant with fluid restriction, he is undergoing legal guardianship and awaiting placement. ? ? Subjective:  ? ?Patient in bed, appears comfortable, denies any headache, no fever, no chest pain or pressure, no shortness of breath , no abdominal pain. No new focal weakness. ? ? ? Assessment  & Plan :  ? ? 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  Continue salt tablets, continue Xanax per psych.  Will check labs along with electrolytes on 12/05/2021. ? ?Dementia without behavioral disturbance ([place])  During initial admission over 1 year ago had formal neuropsychiatric evaluation by Dr. Sima Matas who diagnosed patient with dementia likely related to years of schizophrenia and nonadherence to medication regimen. SLUMS evaluation worsened since previous admission noting  has declined from 18 to 12.  He lacks capacity to make decisions With the assistance of APS interim already in ship has been completed since family not interested in taking on this role. Psych following undergoing legal guardianship evaluation and placement evaluation. ? ?Paranoid schizophrenia with hallucinations -  Schizophrenia symptoms and decompensation worsened by recurrent hyponatremia, Continue Seroquel and Invega, continue Xanax but increased from 0.25 to 0.5 mg since this will also  help with tobacco cravings and in the past this is helped with his psychogenic polydipsia. ? ?Seizure disorder ([place]) [place] due to hyponatremia.  Continue Depakote-History of nonadherence and valproic acid on 1/22 was 19, MRI revealed arachnoid cyst that is chronic in nature, last breakthrough seizure on 10/30/2021 due to hyponatremia.  Seen by neurology this admission ?  ?Chronic systolic CHF (congestive heart failure) (HCC) EF 30% on last echocardiogram in January 2023.  Continue Coreg, digoxin and Entresto, as needed Lasix.  No acute issue.  Require outpatient cardiology follow-up postdischarge. ?  ?COPD (chronic obstructive pulmonary disease) (HCC) - Compensated, given Pneumovax immunization this admission. ? ?Peripheral neuropathy - Chronic issue and was present during previous admission, likely related to years of substance abuse ?Continue low-dose Lyrica since was effective previous ? ?Hyperlipidemia -  Continue lipitor ?  ? ?   ? ?Condition - Fair ? ?Family Communication  :  None present ? ?Code Status :  Full ? ?Consults  :  Psych, Neurology ? ?PUD Prophylaxis :  ? ? Procedures  :    ? ?EEG ? ?TTE ? ?   ? ?Disposition Plan  :   ? ?Status is: Inpatient ? ?DVT Prophylaxis  :   ? ?Place and maintain sequential compression device Start: 11/01/21 1711 ? ?Lab Results  ?Component Value Date  ? PLT 151 12/02/2021  ? ? ?Diet :  ?Diet Order   ? ?       ?  Diet regular Room service appropriate? No; Fluid consistency: Thin; Fluid  restriction: 1200 mL Fluid  Diet effective now       ?  ? ?  ?  ? ?  ?  ? ?Inpatient Medications ? ?Scheduled Meds: ? ALPRAZolam  0.5 mg Oral BID  ? atorvastatin  40 mg Oral Daily  ? carvedilol  12.5 mg Oral BID WC  ? digoxin  0.25 mg Oral Daily  ? divalproex  500 mg Oral BID  ? melatonin  3 mg Oral QHS  ? mirtazapine  15 mg Oral QHS  ? nicotine  14 mg Transdermal Daily  ? paliperidone  3 mg Oral Daily  ? pregabalin  25 mg Oral TID  ? QUEtiapine  50 mg Oral QHS  ? sacubitril-valsartan  1 tablet Oral BID  ? senna-docusate  2 tablet Oral BID  ? sodium chloride  2 g Oral BID WC  ? ?Continuous Infusions: ?PRN Meds:.acetaminophen **OR** acetaminophen, haloperidol lactate, hydrALAZINE, ipratropium-albuterol, LORazepam, polyethylene glycol ? ?Antibiotics  :   ? ?Anti-infectives (From admission, onward)  ? ? None  ? ?  ? ? ? Time Spent in minutes  30 ? ? ?Lala Lund M.D on 12/04/2021 at 10:26 AM ? ?To page go to [email or web address]  ? ?[facility] -  Office  [phone] ? ?See all Orders from today for further details ? ? ? Objective:  ? ?Vitals:  ? 12/03/21 1605 12/03/21 2027 12/03/21 2321 12/04/21 0409  ?BP: (!) 153/82 (!) 145/73 (!) 147/96 114/68  ?Pulse: 73 77 77 74  ?Resp: 16 17 18 19   ?Temp: 98.6 ?F (37 ?C) 98 ?F (36.7 ?C) (!) 97.5 ?F (36.4 ?C) 98 ?F (36.7 ?C)  ?TempSrc: Oral Oral Oral Oral  ?SpO2: 98% 97% 99% 95%  ?Weight:      ?Height:      ? ? ?Wt Readings from Last 3 Encounters:  ?11/29/21 109.3 kg  ?07/27/21 95.3 kg  ?07/22/20 98.2 kg  ? ? ? ?Intake/Output Summary (Last 24 hours) at 12/04/2021  1026 ?Last data filed at 12/03/2021 2357 ?Gross per 24 hour  ?Intake 800 ml  ?Output --  ?Net 800 ml  ? ? ? ?Physical Exam ? ?Awake Alert, No new F.N deficits, Normal affect ?[place].AT,PERRAL ?Supple Neck, No JVD,   ?Symmetrical Chest wall movement, Good air movement bilaterally, CTAB ?RRR,No Gallops, Rubs or new Murmurs,  ?+ve B.Sounds, Abd Soft, No tenderness,   ?No Cyanosis, Clubbing or edema  ? ? ? Data Review:   ? ? ?CBC ?Recent Labs  ?Lab 12/02/21 ?0233  ?WBC 7.2  ?HGB 12.4*  ?HCT 36.0*  ?PLT 151  ?MCV 67.8*  ?MCH 23.4*  ?MCHC 34.4  ?RDW 16.5*  ?LYMPHSABS 4.0  ?MONOABS 0.9  ?EOSABS 0.2  ?BASOSABS 0.0  ? ? ?Electrolytes ?Recent Labs  ?Lab 11/28/21 ?0645 12/02/21 ?0233  ?NA 132* 131*  ?K 4.1 4.2  ?CL 99 98  ?CO2 27 27  ?GLUCOSE 91 99  ?BUN 10 11  ?CREATININE 0.66 0.66  ?CALCIUM 9.0 8.8*  ?AST  --  16  ?ALT  --  12  ?ALKPHOS  --  62  ?BILITOT  --  0.4  ?ALBUMIN  --  3.6  ? ? ? ?Micro Results ?No results found for this or any previous visit (from the past 240 hour(s)). ? ?Radiology Reports ?No results found.  ? ? ?

## 2021-12-05 DIAGNOSIS — E871 Hypo-osmolality and hyponatremia: Secondary | ICD-10-CM | POA: Diagnosis not present

## 2021-12-05 LAB — COMPREHENSIVE METABOLIC PANEL
ALT: 12 U/L (ref 0–44)
AST: 19 U/L (ref 15–41)
Albumin: 3.8 g/dL (ref 3.5–5.0)
Alkaline Phosphatase: 63 U/L (ref 38–126)
Anion gap: 7 (ref 5–15)
BUN: 6 mg/dL (ref 6–20)
CO2: 26 mmol/L (ref 22–32)
Calcium: 8.8 mg/dL — ABNORMAL LOW (ref 8.9–10.3)
Chloride: 98 mmol/L (ref 98–111)
Creatinine, Ser: 0.68 mg/dL (ref 0.61–1.24)
GFR, Estimated: >60 mL/min (ref >60)
Glucose, Bld: 114 mg/dL — ABNORMAL HIGH (ref 70–99)
Potassium: 3.8 mmol/L (ref 3.5–5.1)
Sodium: 131 mmol/L — ABNORMAL LOW (ref 135–145)
Total Bilirubin: 0.7 mg/dL (ref 0.3–1.2)
Total Protein: 6.7 g/dL (ref 6.5–8.1)

## 2021-12-05 LAB — CBC
HCT: 38.5 % — ABNORMAL LOW (ref 39.0–52.0)
Hemoglobin: 12.7 g/dL — ABNORMAL LOW (ref 13.0–17.0)
MCH: 22.8 pg — ABNORMAL LOW (ref 26.0–34.0)
MCHC: 33 g/dL (ref 30.0–36.0)
MCV: 69.1 fL — ABNORMAL LOW (ref 80.0–100.0)
Platelets: 141 10*3/uL — ABNORMAL LOW (ref 150–400)
RBC: 5.57 MIL/uL (ref 4.22–5.81)
RDW: 16.9 % — ABNORMAL HIGH (ref 11.5–15.5)
WBC: 6.7 10*3/uL (ref 4.0–10.5)
nRBC: 0 % (ref 0.0–0.2)

## 2021-12-05 LAB — MAGNESIUM: Magnesium: 1.9 mg/dL (ref 1.7–2.4)

## 2021-12-05 NOTE — Progress Notes (Addendum)
?Progress Note ?  ?  ?Patient: Jim Dunn QJJ:[id number] DOB: Jul 09, 1966 DOA: 09/24/2021     56 ?DOS: the patient was seen and examined on 12/02/2021 ?  ?Brief hospital course: ?Phat Dalton is a 56 y.o. male with PMH significant for schizophrenia, chronic hyponatremia, seizure disorder, tobacco use, systolic congestive heart failure, COPD who was brought to ED on 09/24/2021 by his family after they were unable to take care of him.  He was drinking a lot of water and trying to burn things. Previously he was staying at group home and was kicked out.   ?Neurology and psychiatry were consulted.   ?Hospital course remarkable for acute on chronic hyponatremia, breakthrough seizures. ?Prolonged hospitalization.   ?TOC following.  ?APS in process to determine if they will pursue guardianship ?  ?  ?Patient's behaviors have been stable.  Process was put in place to allow patient to briefly leave the floor given long length of stay.  Criteria were as follows: Must notify the nurse when he was leaving the floor, would not be allowed to leave the floor for longer than 30 minutes and he was not allowed to leave hospital grounds.  If any 1 of these criteria were broken off unit privileges would be revoked.  It is noted that eventual discharge plan would be to a group home although 24/7 monitoring of fluid intake would be difficult in that setting. ?  ?On the evening of 2/26 patient developed a grand mal seizure and it was noted his sodium had decreased significantly to 117.  It was felt that patient had been drinking fluids in his room and off the unit leading to hypervolemic hyponatremia.  He has a history of polydipsia and has had seizures related to this in the past.  He was placed on hypertonic saline and transferred to the ICU service.  Of note patient had been on Lasix, total fluid restriction versus only free water restriction and salt tablets.  As of now Lasix has been discontinued.  Fluid restriction pertains to only free  water and prior to ICU team assuming care orders changed to allow pt to consume Gatorade and other electrolyte-based fluids to help with excessive thirst. ?  ?  ?  ?  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  Current sodium stable at 131 ?Continue to hold Lasix.  Restriction will focus on free water only (including coffee).  Patient is allowed electrolyte-based fluids such as Gatorade as well as juices and milk that do not count towards the fluid restriction ?  ?@@Please  do not dc his xanax-this drug has been extremely helpful in the management of his psychogenic polydipsia both during this admission and during the previuos admission when I cared for him@@ ?  ?  ?Seizure disorder (HCC) ?Continue Depakote-History of nonadherence in the outpatient setting.  Valproic acid on 1/22 was 19 ?MRI: arachnoid cyst that is chronic in nature ?2/26 grand mal seizure secondary to sodium of 120-at the time valproic acid level 58 ?4/4 Platelets have decreased to 141,000-rpt CBC in am-as long as platelets > 100,000 can cont Depakote ?  ?Elevated CK ?Resolved ?  ?Paranoid schizophrenia with hallucinations ?Schizophrenia decompensation worsened by recurrent hyponatremia at time of admission ?Continue Seroquel and Invega ?Continue Xanax noting this is helpful for his psychogenic polydipsia ?Off unit privileges accompanied  by staff only ?  ?  ?Chronic systolic CHF (congestive heart failure) (HCC) ?Continue digoxin and Entresto  ?Lasix discontinued in context of recurrent hyponatremia.  Previously utilized prn for weight gain ?Digoxin level subtherapeutic as of 2/10 but this also appears to be a chronic issue.   ?Strict I/O and daily weights ?Fluid restriction for chronic hyponatremia for free water  primarily-allowing gatorade and other electrolyte based fluids to not be included in FR ?No consistent outpatient follow-up with cardiology.  ?Echocardiogram 09/26/2021 EF of 30-35% with moderately decreased systolic function and global hypokinesis. RV size and systolic function is normal. ?  ?Dementia without behavioral disturbance (HCC) ?During initial admission over 1 year ago had formal neuropsychiatric evaluation by Dr. Kieth Brightly who diagnosed patient with dementia 2/2 schizophrenia and nonadherence to medication regimen ?SLUMS evaluation worsened since previous admission noting has declined from 18 to 12.  He lacks capacity to make decisions ?Hospital legal team assisting with guardianship process.  Hearing planned for 01/25/2022 ?  ?Peripheral neuropathy ?Chronic issue and was present during previous admission ?Likely related to years of substance abuse ?Continue low-dose Lyrica since was effective previous ? ?A physical therapy consult is indicated based on the patient?s mobility assessment. ?  ?  ?Mobility Assessment (last 72 hours)   ?  ?  Mobility Assessment   ?  ?  Row Name 11/01/21 0730 10/31/21 0730 10/30/21 1128 10/29/21 2100 10/29/21 0808  ?  Does patient have an order for bedrest or is patient medically unstable No - Continue assessment No - Continue assessment No - Continue assessment No - Continue assessment No - Continue assessment  ?  What is the highest level of mobility based on the progressive mobility assessment? Level 6 (Walks independently in room and hall) - Balance while walking in room without assist - Complete Level 6 (Walks independently in room and hall) - Balance while walking in room without assist - Complete Level 6 (Walks independently in room and hall) - Balance while walking in room without assist - Complete Level 6 (Walks independently in room and hall) - Balance while walking in room without assist - Complete Level 5 (Walks with assist in room/hall) - Balance while stepping  forward/back and can walk in room with assist - Complete  ?  ?   ?  ?  ?   ?  ?Mixed hyperlipidemia ?Continue lipitor ?Apparently has been nonadherent with medicines prior to admission ?  ?COPD (chronic obstructive pulmonary disease) (HCC) ?Compensated ?Given Pneumovax immunization this admission ? ? ?Subjective:  ?Laying in bed.  Listening to [employer].  No requests or complaints. ?  ?Physical Exam: ?      ?Vitals:  ?  12/01/21 2304 12/02/21 0434 12/02/21 0905 12/02/21 1138  ?BP: (!) 145/87 106/74 (!) 166/88 (!) 156/88  ?Pulse: 79 68 68 65  ?Resp: 19 19 14 20   ?Temp: 98.4 ?F (36.9 ?C) 98.1 ?F (36.7 ?C) 98.2 ?F (36.8 ?C) 97.6 ?F (36.4 ?C)  ?TempSrc: Oral Oral Oral Oral  ?SpO2: 97% 98% 100% 99%  ?Weight:          ?Height:          ?  ?Gen: No acute distress, calm ?Pulm: Stable on room air, posterior lung sounds clear.  Respiratory effort normal ?Cardiac: Regular pulse,S1-S2, no peripheral edema, normotensive ?Abd: Soft nontender, nondistended, normoactive bowel sounds. LBM 4/2 ?Neuro: CN II through XII grossly intact, ambulates independently sensation intact.   ?Psych: Alert and oriented.  Pleasant affect. ?  ?  ?  ?  Data Reviewed: ?  ?Results reviewed ?  ?Family Communication:  ?Patient ?  ?Disposition: ?Remains inpatient appropriate because:  ?Unsafe DC plan ?  ?Planned Discharge Destination:  ?Barriers to discharge: APS to determine if they will pursue guardianship. Family refusing to take pt because stating they cannot manage him noting refuses to take meds ?  ?Medically stable ?Yes ?  ?COVID vaccination status:  ?[employer] 07/20/2020 ?  ?Consultants: ?Psychiatry ?Neurology ?Procedures: ?EEG ?Echocardiogram ?Antibiotics: ?None ?  ?  ?  ?  ?Time spent: 10 minutes ?  ?Author: ?Junious Silk, NP ?12/02/2021 12:55 PM ?

## 2021-12-07 DIAGNOSIS — E871 Hypo-osmolality and hyponatremia: Secondary | ICD-10-CM | POA: Diagnosis not present

## 2021-12-07 NOTE — Progress Notes (Signed)
CSW spoke with Charlcie Cradle of [place] [facility] to discuss the May 24th court hearing. Charlcie Cradle states the patient's case will remain open until after the court hearing. ? ?[name], MSW, LCSW ?Transitions of Care  Clinical Social Worker II ?[phone] ? ?

## 2021-12-07 NOTE — Progress Notes (Signed)
?Progress Note ?  ?  ?Patient: Jim Dunn ENI:[id number] DOB: Oct 16, 1965 DOA: 09/24/2021     66 ?DOS: the patient was seen and examined on 12/02/2021 ?  ?Brief hospital course: ?Jim Dunn is a 56 y.o. male with PMH significant for schizophrenia, chronic hyponatremia, seizure disorder, tobacco use, systolic congestive heart failure, COPD who was brought to ED on 09/24/2021 by his family after they were unable to take care of him.  He was drinking a lot of water and trying to burn things. Previously he was staying at group home and was kicked out.   ?Neurology and psychiatry were consulted.   ?Hospital course remarkable for acute on chronic hyponatremia, breakthrough seizures. ?Prolonged hospitalization.   ?TOC following.  ?APS in process to determine if they will pursue guardianship ?  ?  ?Patient's behaviors have been stable.  Process was put in place to allow patient to briefly leave the floor given long length of stay.  Criteria were as follows: Must notify the nurse when he was leaving the floor, would not be allowed to leave the floor for longer than 30 minutes and he was not allowed to leave hospital grounds.  If any 1 of these criteria were broken off unit privileges would be revoked.  It is noted that eventual discharge plan would be to a group home although 24/7 monitoring of fluid intake would be difficult in that setting. ?  ?On the evening of 2/26 patient developed a grand mal seizure and it was noted his sodium had decreased significantly to 117.  It was felt that patient had been drinking fluids in his room and off the unit leading to hypervolemic hyponatremia.  He has a history of polydipsia and has had seizures related to this in the past.  He was placed on hypertonic saline and transferred to the ICU service.  Of note patient had been on Lasix, total fluid restriction versus only free water restriction and salt tablets.  As of now Lasix has been discontinued.  Fluid restriction pertains to only free  water and prior to ICU team assuming care orders changed to allow pt to consume Gatorade and other electrolyte-based fluids to help with excessive thirst. ?  ?  ?  ?  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  Current sodium stable at 131 ?Continue to hold Lasix.  Restriction will focus on free water only (including coffee).  Patient is allowed electrolyte-based fluids such as Gatorade as well as juices and milk that do not count towards the fluid restriction ?  ?@@Please  do not dc his xanax-this drug has been extremely helpful in the management of his psychogenic polydipsia both during this admission and during the previuos admission when I cared for him@@ ?  ?  ?Seizure disorder (HCC) ?Continue Depakote-History of nonadherence in the outpatient setting.  Valproic acid on 1/22 was 19 ?MRI: arachnoid cyst that is chronic in nature ?2/26 grand mal seizure secondary to sodium of 120-at the time valproic acid level 58 ?4/4 Platelets have decreased to 141,000-rpt CBC in am-as long as platelets > 100,000 can cont Depakote ?  ?Elevated CK ?Resolved ?  ?Paranoid schizophrenia with hallucinations ?Schizophrenia decompensation worsened by recurrent hyponatremia at time of admission ?Continue Seroquel and Invega ?Continue Xanax for psychogenic polydipsia ?Off unit privileges accompanied by staff only ?  ?  ?  Chronic systolic CHF (congestive heart failure) ([facility]) ?Continue digoxin and Entresto  ?Lasix discontinued in context of recurrent hyponatremia.  Previously utilized prn for weight gain ?Digoxin level subtherapeutic as of 2/10 but this also appears to be a chronic issue.   ?Strict I/O and daily weights ?No consistent outpatient follow-up with cardiology.  ?Echocardiogram 09/26/2021 EF of 30-35% with  moderately decreased systolic function and global hypokinesis. RV size and systolic function is normal. ?  ?Dementia without behavioral disturbance ([place]) ?During initial admission over 1 year ago had formal neuropsychiatric evaluation by Dr. Sima Matas who diagnosed patient with dementia 2/2 schizophrenia and nonadherence to medication regimen ?SLUMS evaluation worsened since previous admission noting has declined from 18 to 12.  He lacks capacity to make decisions ?Hospital legal team assisting with guardianship process.  Hearing planned for 01/25/2022 ?  ?Peripheral neuropathy ?Chronic issue and was present during previous admission ?Likely related to years of substance abuse ?Continue low-dose Lyrica since was effective previous ? ?A physical therapy consult is indicated based on the patient?s mobility assessment. ?  ?  ?Mobility Assessment (last 72 hours)   ?  ?  Mobility Assessment   ?  ?  [place] Name 11/01/21 0730 10/31/21 0730 10/30/21 1128 10/29/21 2100 10/29/21 0808  ?  Does patient have an order for bedrest or is patient medically unstable No - Continue assessment No - Continue assessment No - Continue assessment No - Continue assessment No - Continue assessment  ?  What is the highest level of mobility based on the progressive mobility assessment? Level 6 (Walks independently in room and hall) - Balance while walking in room without assist - Complete Level 6 (Walks independently in room and hall) - Balance while walking in room without assist - Complete Level 6 (Walks independently in room and hall) - Balance while walking in room without assist - Complete Level 6 (Walks independently in room and hall) - Balance while walking in room without assist - Complete Level 5 (Walks with assist in room/hall) - Balance while stepping forward/back and can walk in room with assist - Complete  ?  ?   ?  ?  ?   ?  ?Mixed hyperlipidemia ?Continue lipitor ?Apparently has been nonadherent with medicines prior to admission ?   ?COPD (chronic obstructive pulmonary disease) ([place]) ?Compensated ?Given Pneumovax immunization this admission ? ? ?Subjective:  ?Laying in bed.  Listening to [employer].  No requests or complaints. ?  ?Physical Exam: ?      ?Vitals:  ?  12/01/21 2304 12/02/21 0434 12/02/21 0905 12/02/21 1138  ?BP: (!) 145/87 106/74 (!) 166/88 (!) 156/88  ?Pulse: 79 68 68 65  ?Resp: 19 19 14 20   ?Temp: 98.4 ?F (36.9 ?C) 98.1 ?F (36.7 ?C) 98.2 ?F (36.8 ?C) 97.6 ?F (36.4 ?C)  ?TempSrc: Oral Oral Oral Oral  ?SpO2: 97% 98% 100% 99%  ?Weight:          ?Height:          ?  ?Gen: No acute distress, calm ?Pulm: Stable on room air, posterior lung sounds clear.  Respiratory effort normal ?Cardiac: Regular pulse,S1-S2, no peripheral edema, normotensive ?Abd: Soft nontender, nondistended, normoactive bowel sounds. LBM 4/2 ?Neuro: CN II through XII grossly intact, ambulates independently sensation intact.   ?Psych: Alert and oriented.  Pleasant affect. ?  ?  ?  ?Data Reviewed: ?  ?Results reviewed ?  ?Family Communication:  ?Patient ?  ?Disposition: ?Remains inpatient appropriate because:  ?Unsafe DC plan ?  ?  Planned Discharge Destination:  ?Barriers to discharge: APS to determine if they will pursue guardianship. Family refusing to take pt because stating they cannot manage him noting refuses to take meds ?  ?Medically stable ?Yes ?  ?COVID vaccination status:  ?[employer] 07/20/2020 ?  ?Consultants: ?Psychiatry ?Neurology ?Procedures: ?EEG ?Echocardiogram ?Antibiotics: ?None ?  ?  ?  ?  ?Time spent: 10 minutes ?  ?Author: ?Erin Hearing, NP ?12/02/2021 12:55 PM ?

## 2021-12-08 DIAGNOSIS — E871 Hypo-osmolality and hyponatremia: Secondary | ICD-10-CM | POA: Diagnosis not present

## 2021-12-08 NOTE — Progress Notes (Signed)
?Progress Note ?  ?  ?Patient: Jim Dunn [id number] DOB: Jan 25, 1966 DOA: 09/24/2021     56 ?DOS: the patient was seen and examined on 12/02/2021 ?  ?Brief hospital course: ?Praveen Trzcinski is a 56 y.o. male with PMH significant for schizophrenia, chronic hyponatremia, seizure disorder, tobacco use, systolic congestive heart failure, COPD who was brought to ED on 09/24/2021 by his family after they were unable to take care of him.  He was drinking a lot of water and trying to burn things. Previously he was staying at group home and was kicked out.   ?Neurology and psychiatry were consulted.   ?Hospital course remarkable for acute on chronic hyponatremia, breakthrough seizures. ?Prolonged hospitalization.   ?TOC following.  ?APS in process to determine if they will pursue guardianship ?  ?  ?Patient's behaviors have been stable.  Process was put in place to allow patient to briefly leave the floor given long length of stay.  Criteria were as follows: Must notify the nurse when he was leaving the floor, would not be allowed to leave the floor for longer than 30 minutes and he was not allowed to leave hospital grounds.  If any 1 of these criteria were broken off unit privileges would be revoked.  It is noted that eventual discharge plan would be to a group home although 24/7 monitoring of fluid intake would be difficult in that setting. ?  ?On the evening of 2/26 patient developed a grand mal seizure and it was noted his sodium had decreased significantly to 117.  It was felt that patient had been drinking fluids in his room and off the unit leading to hypervolemic hyponatremia.  He has a history of polydipsia and has had seizures related to this in the past.  He was placed on hypertonic saline and transferred to the ICU service.  Of note patient had been on Lasix, total fluid restriction versus only free water restriction and salt tablets.  As of now Lasix has been discontinued.  Fluid restriction pertains to only free  water and prior to ICU team assuming care orders changed to allow pt to consume Gatorade and other electrolyte-based fluids to help with excessive thirst. ?  ?  ?  ?  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  Current sodium stable at 131 ?Continue to hold Lasix.  Restriction will focus on free water only (including coffee).  Patient is allowed electrolyte-based fluids such as Gatorade as well as juices and milk that do not count towards the fluid restriction ?  ?@@Please  do not dc his xanax-this drug has been extremely helpful in the management of his psychogenic polydipsia both during this admission and during the previuos admission when I cared for him@@ ?  ?  ?Seizure disorder ([place]) ?Continue Depakote-History of nonadherence in the outpatient setting.  Valproic acid on 1/22 was 19 ?MRI: arachnoid cyst that is chronic in nature ?2/26 grand mal seizure secondary to sodium of 120-at the time valproic acid level 58 ?4/4 Platelets have decreased to 141,000-rpt CBC in am-4/7as long as platelets > 100,000 can cont Depakote ?  ?Elevated CK ?Resolved ?  ?Paranoid schizophrenia with hallucinations ?Schizophrenia decompensation worsened by recurrent hyponatremia at time of admission ?Continue Seroquel and Invega ?Continue Xanax for psychogenic polydipsia ?Off unit privileges accompanied by staff only ?  ?  ?  Chronic systolic CHF (congestive heart failure) ([employer]) ?Continue digoxin and Entresto  ?Lasix discontinued in context of recurrent hyponatremia.  Previously utilized prn for weight gain ?Digoxin level subtherapeutic as of 2/10 but this also appears to be a chronic issue.   ?Strict I/O and daily weights ?No consistent outpatient follow-up with cardiology.  ?Echocardiogram 09/26/2021 EF of 30-35% with  moderately decreased systolic function and global hypokinesis. RV size and systolic function is normal. ?  ?Dementia without behavioral disturbance ([place]) ?During initial admission over 1 year ago had formal neuropsychiatric evaluation by Dr. Sima Matas who diagnosed patient with dementia 2/2 schizophrenia and nonadherence to medication regimen ?SLUMS evaluation worsened since previous admission noting has declined from 18 to 12.  He lacks capacity to make decisions ?Hospital legal team assisting with guardianship process.  Hearing planned for 01/25/2022 ?  ?Peripheral neuropathy ?Chronic issue and was present during previous admission ?Likely related to years of substance abuse ?Continue low-dose Lyrica since was effective previous ? ?A physical therapy consult is indicated based on the patient?s mobility assessment. ?  ?  ?Mobility Assessment (last 72 hours)   ?  ?  Mobility Assessment   ?  ?  [place] Name 11/01/21 0730 10/31/21 0730 10/30/21 1128 10/29/21 2100 10/29/21 0808  ?  Does patient have an order for bedrest or is patient medically unstable No - Continue assessment No - Continue assessment No - Continue assessment No - Continue assessment No - Continue assessment  ?  What is the highest level of mobility based on the progressive mobility assessment? Level 6 (Walks independently in room and hall) - Balance while walking in room without assist - Complete Level 6 (Walks independently in room and hall) - Balance while walking in room without assist - Complete Level 6 (Walks independently in room and hall) - Balance while walking in room without assist - Complete Level 6 (Walks independently in room and hall) - Balance while walking in room without assist - Complete Level 5 (Walks with assist in room/hall) - Balance while stepping forward/back and can walk in room with assist - Complete  ?  ?   ?  ?  ?   ?  ?Mixed hyperlipidemia ?Continue lipitor ?Apparently has been nonadherent with medicines prior to admission ?   ?COPD (chronic obstructive pulmonary disease) ([place]) ?Compensated ?Given Pneumovax immunization this admission ? ? ?Subjective:  ?Laying in bed.  Listening to [employer].  No requests or complaints. ?  ?Physical Exam: ?      ?Vitals:  ?  12/01/21 2304 12/02/21 0434 12/02/21 0905 12/02/21 1138  ?BP: (!) 145/87 106/74 (!) 166/88 (!) 156/88  ?Pulse: 79 68 68 65  ?Resp: 19 19 14 20   ?Temp: 98.4 ?F (36.9 ?C) 98.1 ?F (36.7 ?C) 98.2 ?F (36.8 ?C) 97.6 ?F (36.4 ?C)  ?TempSrc: Oral Oral Oral Oral  ?SpO2: 97% 98% 100% 99%  ?Weight:          ?Height:          ?  ?Gen: No acute distress, calm ?Pulm: Stable on room air, posterior lung sounds clear.  Respiratory effort normal ?Cardiac: Regular pulse,S1-S2, no peripheral edema, normotensive ?Abd: Soft nontender, nondistended, normoactive bowel sounds. LBM 4/2 ?Neuro: CN II through XII grossly intact, ambulates independently sensation intact.   ?Psych: Alert and oriented.  Pleasant affect. ?  ?  ?  ?Data Reviewed: ?  ?Results reviewed ?  ?Family Communication:  ?Patient ?  ?Disposition: ?Remains inpatient appropriate because:  ?Unsafe DC plan ?  ?  Planned Discharge Destination:  ?Barriers to discharge: APS to determine if they will pursue guardianship. Family refusing to take pt because stating they cannot manage him noting refuses to take meds ?  ?Medically stable ?Yes ?  ?COVID vaccination status:  ?[place] 07/20/2020 ?  ?Consultants: ?Psychiatry ?Neurology ?Procedures: ?EEG ?Echocardiogram ?Antibiotics: ?None ?  ?  ?  ?  ?Time spent: 10 minutes ?  ?Author: ?Erin Hearing, NP ?12/02/2021 12:55 PM ?

## 2021-12-09 DIAGNOSIS — E871 Hypo-osmolality and hyponatremia: Secondary | ICD-10-CM | POA: Diagnosis not present

## 2021-12-09 LAB — COMPREHENSIVE METABOLIC PANEL
ALT: 14 U/L (ref 0–44)
AST: 21 U/L (ref 15–41)
Albumin: 3.5 g/dL (ref 3.5–5.0)
Alkaline Phosphatase: 60 U/L (ref 38–126)
Anion gap: 10 (ref 5–15)
BUN: 6 mg/dL (ref 6–20)
CO2: 23 mmol/L (ref 22–32)
Calcium: 8.6 mg/dL — ABNORMAL LOW (ref 8.9–10.3)
Chloride: 95 mmol/L — ABNORMAL LOW (ref 98–111)
Creatinine, Ser: 0.73 mg/dL (ref 0.61–1.24)
GFR, Estimated: >60 mL/min (ref >60)
Glucose, Bld: 146 mg/dL — ABNORMAL HIGH (ref 70–99)
Potassium: 3.5 mmol/L (ref 3.5–5.1)
Sodium: 128 mmol/L — ABNORMAL LOW (ref 135–145)
Total Bilirubin: 0.2 mg/dL — ABNORMAL LOW (ref 0.3–1.2)
Total Protein: 5.9 g/dL — ABNORMAL LOW (ref 6.5–8.1)

## 2021-12-09 LAB — CBC WITH DIFFERENTIAL/PLATELET
Abs Immature Granulocytes: 0.08 10*3/uL — ABNORMAL HIGH (ref 0.00–0.07)
Basophils Absolute: 0 10*3/uL (ref 0.0–0.1)
Basophils Relative: 1 %
Eosinophils Absolute: 0.2 10*3/uL (ref 0.0–0.5)
Eosinophils Relative: 3 %
HCT: 35.7 % — ABNORMAL LOW (ref 39.0–52.0)
Hemoglobin: 11.7 g/dL — ABNORMAL LOW (ref 13.0–17.0)
Immature Granulocytes: 1 %
Lymphocytes Relative: 45 %
Lymphs Abs: 3.4 10*3/uL (ref 0.7–4.0)
MCH: 22.6 pg — ABNORMAL LOW (ref 26.0–34.0)
MCHC: 32.8 g/dL (ref 30.0–36.0)
MCV: 68.9 fL — ABNORMAL LOW (ref 80.0–100.0)
Monocytes Absolute: 0.9 10*3/uL (ref 0.1–1.0)
Monocytes Relative: 12 %
Neutro Abs: 2.8 10*3/uL (ref 1.7–7.7)
Neutrophils Relative %: 38 %
Platelets: 135 10*3/uL — ABNORMAL LOW (ref 150–400)
RBC: 5.18 MIL/uL (ref 4.22–5.81)
RDW: 16.2 % — ABNORMAL HIGH (ref 11.5–15.5)
WBC: 7.5 10*3/uL (ref 4.0–10.5)
nRBC: 0 % (ref 0.0–0.2)

## 2021-12-09 MED ORDER — FUROSEMIDE 40 MG PO TABS
40.0000 mg | ORAL_TABLET | Freq: Once | ORAL | Status: AC
  Administered 2021-12-09: 40 mg via ORAL
  Filled 2021-12-09: qty 1

## 2021-12-09 MED ORDER — PREGABALIN 25 MG PO CAPS
25.0000 mg | ORAL_CAPSULE | Freq: Two times a day (BID) | ORAL | Status: DC
  Administered 2021-12-09 – 2021-12-15 (×14): 25 mg via ORAL
  Filled 2021-12-09 (×14): qty 1

## 2021-12-09 NOTE — Progress Notes (Signed)
?Progress Note ?  ?  ?Patient: Jim Dunn [id number] DOB: 02-23-1966 DOA: 09/24/2021     66 ?DOS: the patient was seen and examined on 12/02/2021 ?  ?Brief hospital course: ?Jim Dunn is a 56 y.o. male with PMH significant for schizophrenia, chronic hyponatremia, seizure disorder, tobacco use, systolic congestive heart failure, COPD who was brought to ED on 09/24/2021 by his family after they were unable to take care of him.  He was drinking a lot of water and trying to burn things. Previously he was staying at group home and was kicked out.   ?Neurology and psychiatry were consulted.   ?Hospital course remarkable for acute on chronic hyponatremia, breakthrough seizures. ?Prolonged hospitalization.   ?TOC following.  ?APS in process to determine if they will pursue guardianship ?  ?  ?Patient's behaviors have been stable.  Process was put in place to allow patient to briefly leave the floor given long length of stay.  Criteria were as follows: Must notify the nurse when he was leaving the floor, would not be allowed to leave the floor for longer than 30 minutes and he was not allowed to leave hospital grounds.  If any 1 of these criteria were broken off unit privileges would be revoked.  It is noted that eventual discharge plan would be to a group home although 24/7 monitoring of fluid intake would be difficult in that setting. ?  ?On the evening of 2/26 patient developed a grand mal seizure and it was noted his sodium had decreased significantly to 117.  It was felt that patient had been drinking fluids in his room and off the unit leading to hypervolemic hyponatremia.  He has a history of polydipsia and has had seizures related to this in the past.  He was placed on hypertonic saline and transferred to the ICU service.  Of note patient had been on Lasix, total fluid restriction versus only free water restriction and salt tablets.  As of now Lasix has been discontinued.  Fluid restriction pertains to only free  water and prior to ICU team assuming care orders changed to allow pt to consume Gatorade and other electrolyte-based fluids to help with excessive thirst. ?  ?  ?  ?  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  Current sodium stable at 131 ?Continue to hold scheduled Lasix.  Given one-time dose of Lasix by attending physician on 4/7.  Restriction will focus on free water only (including coffee).  Patient is allowed electrolyte-based fluids such as Gatorade as well as juices and milk that do not count towards the fluid restriction ?  ?@@Please  do not dc his xanax-this drug has been extremely helpful in the management of his psychogenic polydipsia both during this admission and during the previuos admission when I cared for him@@ ?  ?Thrombocytopenia ?Platelets have been stable throughout the hospitalization but over the past few days have slowly dropped with current reading 135,000 ?Potential offenders include Lyrica and Depakote ?We will go ahead and decrease Lyrica from 3 times daily to twice daily and repeat platelets in a.m. ?As long as platelets greater than 100,000 this will be acceptable if continues to drop will need to continue to taper down Lyrica and consider transitioning to alternate agent-patient did not receive adequate pain relief with Neurontin ?Would like to continue Depakote  since this is being utilized for seizures ? ?Seizure disorder ?Continue Depakote-History of nonadherence in the outpatient setting.  Valproic acid on 1/22 was 19 ?MRI: arachnoid cyst that is chronic in nature ?2/26 grand mal seizure secondary to sodium of 120-at the time valproic acid level 58 ?4/4 Platelets have decreased to 141,000-rpt CBC in am-4/7as long as platelets > 100,000 can cont Depakote ?   ?Elevated CK ?Resolved ?  ?Paranoid schizophrenia with hallucinations ?Schizophrenia decompensation worsened by recurrent hyponatremia at time of admission ?Continue Seroquel and Invega ?Continue Xanax for psychogenic polydipsia ?Off unit privileges accompanied by staff only ?  ?  ?Chronic systolic CHF (congestive heart failure)  ?Continue digoxin and Entresto  ?Lasix discontinued in context of recurrent hyponatremia.  Previously utilized prn for weight gain ?Digoxin level subtherapeutic as of 2/10 but this also appears to be a chronic issue.   ?Strict I/O and daily weights ?No consistent outpatient follow-up with cardiology.  ?Echocardiogram 09/26/2021 EF of 30-35% with moderately decreased systolic function and global hypokinesis. RV size and systolic function is normal. ?  ?Dementia without behavioral disturbance  ?During initial admission over 1 year ago had formal neuropsychiatric evaluation by Dr. Kieth Brightly who diagnosed patient with dementia 2/2 schizophrenia and nonadherence to medication regimen ?SLUMS evaluation worsened since previous admission noting has declined from 18 to 12.  He lacks capacity to make decisions ?Hospital legal team assisting with guardianship process.  Hearing planned for 01/25/2022 ?  ?Peripheral neuropathy ?Chronic issue and was present during previous admission ?Likely related to years of substance abuse ?Continue low-dose Lyrica since was effective previous ? ?A physical therapy consult is indicated based on the patient?s mobility assessment. ?  ?  ?Mobility Assessment (last 72 hours)   ?  ?  Mobility Assessment   ?  ?  [place] Name 11/01/21 0730 10/31/21 0730 10/30/21 1128 10/29/21 2100 10/29/21 0808  ?  Does patient have an order for bedrest or is patient medically unstable No - Continue assessment No - Continue assessment No - Continue assessment No - Continue assessment No - Continue assessment  ?  What is the highest level of mobility based on the progressive mobility assessment?  Level 6 (Walks independently in room and hall) - Balance while walking in room without assist - Complete Level 6 (Walks independently in room and hall) - Balance while walking in room without assist - Complete Level 6 (Walks independently in room and hall) - Balance while walking in room without assist - Complete Level 6 (Walks independently in room and hall) - Balance while walking in room without assist - Complete Level 5 (Walks with assist in room/hall) - Balance while stepping forward/back and can walk in room with assist - Complete  ?  ?   ?  ?  ?   ?  ?Mixed hyperlipidemia ?Continue lipitor ?Apparently has been nonadherent with medicines prior to admission ?  ?COPD (chronic obstructive pulmonary disease) ([facility]) ?Compensated ?Given Pneumovax immunization this admission ? ? ?Subjective:  ?Laying in bed.  Listening to [employer].  No requests or complaints. ?  ?Physical Exam: ?      ?Vitals:  ?  12/01/21 2304 12/02/21 0434 12/02/21 0905 12/02/21 1138  ?BP: (!) 145/87 106/74 (!) 166/88 (!) 156/88  ?Pulse: 79 68 68 65  ?Resp: 19 19 14 20   ?Temp: 98.4 ?F (36.9 ?C) 98.1 ?F (36.7 ?C) 98.2 ?F (36.8 ?C) 97.6 ?F (36.4 ?C)  ?TempSrc: Oral Oral Oral Oral  ?SpO2: 97% 98% 100% 99%  ?Weight:          ?  Height:          ?  ?Gen: No acute distress, calm ?Pulm: Stable on room air, posterior lung sounds clear.  Respiratory effort normal ?Cardiac: Regular pulse,S1-S2, no peripheral edema, normotensive ?Abd: Soft nontender, nondistended, normoactive bowel sounds. LBM 4/2 ?Neuro: CN II through XII grossly intact, ambulates independently sensation intact.   ?Psych: Alert and oriented.  Pleasant affect. ?  ?  ?  ?Data Reviewed: ?  ?Results reviewed ?  ?Family Communication:  ?Patient ?  ?Disposition: ?Remains inpatient appropriate because:  ?Unsafe DC plan ?  ?Planned Discharge Destination:  ?Barriers to discharge: APS to determine if they will pursue guardianship. Family refusing to take pt because stating they cannot manage him  noting refuses to take meds ?  ?Medically stable ?Yes ?  ?COVID vaccination status:  ?[employer] 07/20/2020 ?  ?Consultants: ?Psychiatry ?Neurology ?Procedures: ?EEG ?Echocardiogram ?Antibiotics: ?None ?  ?  ?  ?  ?

## 2021-12-10 DIAGNOSIS — E871 Hypo-osmolality and hyponatremia: Secondary | ICD-10-CM | POA: Diagnosis not present

## 2021-12-10 LAB — CBC WITH DIFFERENTIAL/PLATELET
Abs Immature Granulocytes: 0.09 10*3/uL — ABNORMAL HIGH (ref 0.00–0.07)
Basophils Absolute: 0 10*3/uL (ref 0.0–0.1)
Basophils Relative: 1 %
Eosinophils Absolute: 0.2 10*3/uL (ref 0.0–0.5)
Eosinophils Relative: 2 %
HCT: 36.3 % — ABNORMAL LOW (ref 39.0–52.0)
Hemoglobin: 12.2 g/dL — ABNORMAL LOW (ref 13.0–17.0)
Immature Granulocytes: 1 %
Lymphocytes Relative: 48 %
Lymphs Abs: 3.7 10*3/uL (ref 0.7–4.0)
MCH: 23.1 pg — ABNORMAL LOW (ref 26.0–34.0)
MCHC: 33.6 g/dL (ref 30.0–36.0)
MCV: 68.8 fL — ABNORMAL LOW (ref 80.0–100.0)
Monocytes Absolute: 0.9 10*3/uL (ref 0.1–1.0)
Monocytes Relative: 12 %
Neutro Abs: 2.8 10*3/uL (ref 1.7–7.7)
Neutrophils Relative %: 36 %
Platelets: 142 10*3/uL — ABNORMAL LOW (ref 150–400)
RBC: 5.28 MIL/uL (ref 4.22–5.81)
RDW: 16.5 % — ABNORMAL HIGH (ref 11.5–15.5)
WBC: 7.6 10*3/uL (ref 4.0–10.5)
nRBC: 0 % (ref 0.0–0.2)

## 2021-12-10 LAB — BASIC METABOLIC PANEL
Anion gap: 8 (ref 5–15)
BUN: 8 mg/dL (ref 6–20)
CO2: 26 mmol/L (ref 22–32)
Calcium: 8.8 mg/dL — ABNORMAL LOW (ref 8.9–10.3)
Chloride: 98 mmol/L (ref 98–111)
Creatinine, Ser: 0.65 mg/dL (ref 0.61–1.24)
GFR, Estimated: >60 mL/min (ref >60)
Glucose, Bld: 102 mg/dL — ABNORMAL HIGH (ref 70–99)
Potassium: 4.2 mmol/L (ref 3.5–5.1)
Sodium: 132 mmol/L — ABNORMAL LOW (ref 135–145)

## 2021-12-10 NOTE — Progress Notes (Signed)
?                                  PROGRESS NOTE                                             ?                                                                                                                     ?                                         ? ? Patient Demographics:  ? ? Jim Dunn, is a 56 y.o. male, DOB - 09/19/1965, WVP:[id number] ? ?Outpatient Primary MD for the patient is Pcp, No    LOS - 72  Admit date - 09/24/2021   ? ?Chief Complaint  ?Patient presents with  ? Altered Mental Status  ?  Abnormal labs  ?    ? ?Brief Narrative (HPI from H&P)   56 y.o. male with PMH significant for schizophrenia, chronic hyponatremia, seizure disorder, tobacco use, systolic congestive heart failure, COPD who was brought to ED on 09/24/2021 by his family after they were unable to take care of him.  He was found to be hyponatremic he had symptoms suggestive of severe primary polydipsia.  He also had seizures.  He was seen by psych and neurology.  Currently on fluid restriction, still noncompliant with fluid restriction, he is undergoing legal guardianship and awaiting placement. ? ? Subjective:  ? ?Patient in bed, appears comfortable, denies any headache, no fever, no chest pain or pressure, no shortness of breath , no abdominal pain. No focal weakness. ? ? Assessment  & Plan :  ? ? 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  Continue salt tablets, continue Xanax per psych.   ? ?Dementia without behavioral disturbance ([facility])  During initial admission over 1 year ago had formal neuropsychiatric evaluation by Dr. Kieth Brightly who diagnosed patient with dementia likely related to years of schizophrenia and nonadherence to medication regimen. SLUMS evaluation worsened since previous admission noting has declined from 18 to 12.  He lacks capacity to make  decisions With the assistance of APS interim already in ship has been completed since family not interested in taking on this role. Psych following undergoing legal guardianship evaluation and placement evaluation. ? ?Paranoid schizophrenia with hallucinations -  Schizophrenia symptoms and decompensation worsened by recurrent hyponatremia, Continue Seroquel and Invega, continue Xanax but increased from 0.25 to 0.5 mg since this will also help with tobacco cravings and in the past this  is helped with his psychogenic polydipsia. ? ?Seizure disorder ([facility]) [place] due to hyponatremia.  Continue Depakote-History of nonadherence and valproic acid on 1/22 was 19, MRI revealed arachnoid cyst that is chronic in nature, last breakthrough seizure on 10/30/2021 due to hyponatremia.  Seen by neurology this admission ?  ?Mild asymptomatic thrombocytopenia.  No signs of bleeding or petechiae, likely due to Depakote, also Lyrica dose has been dropped, currently thrombocytopenia is minimal.  Will intermittently monitor.   ? ?Chronic systolic CHF (congestive heart failure) ([facility]) EF 30% on last echocardiogram in January 2023.  Continue Coreg, digoxin and Entresto, as needed Lasix.  No acute issue.  Require outpatient cardiology follow-up postdischarge. ?  ?COPD (chronic obstructive pulmonary disease) ([facility]) - Compensated, given Pneumovax immunization this admission. ? ?Peripheral neuropathy - Chronic issue and was present during previous admission, likely related to years of substance abuse ?Continue low-dose Lyrica since was effective previous ? ?Hyperlipidemia -  Continue lipitor ?  ? ?   ? ?Condition - Fair ? ?Family Communication  :  None present ? ?Code Status :  Full ? ?Consults  :  Psych, Neurology ? ?PUD Prophylaxis :  ? ? Procedures  :    ? ?EEG ? ?TTE ? ?   ? ?Disposition Plan  :   ? ?Status is: Inpatient ? ?DVT Prophylaxis  :   ? ?Place and maintain sequential compression device Start: 11/01/21 1711 ? ?Lab Results  ?Component  Value Date  ? PLT 142 (L) 12/10/2021  ? ? ?Diet :  ?Diet Order   ? ?       ?  Diet regular Room service appropriate? No; Fluid consistency: Thin; Fluid restriction: 1200 mL Fluid  Diet effective 1000       ?  ? ?  ?  ? ?  ?  ? ?Inpatient Medications ? ?Scheduled Meds: ? ALPRAZolam  0.5 mg Oral BID  ? atorvastatin  40 mg Oral Daily  ? carvedilol  12.5 mg Oral BID WC  ? digoxin  0.25 mg Oral Daily  ? divalproex  500 mg Oral BID  ? melatonin  3 mg Oral QHS  ? mirtazapine  15 mg Oral QHS  ? nicotine  14 mg Transdermal Daily  ? paliperidone  3 mg Oral Daily  ? pregabalin  25 mg Oral BID  ? QUEtiapine  50 mg Oral QHS  ? sacubitril-valsartan  1 tablet Oral BID  ? senna-docusate  2 tablet Oral BID  ? sodium chloride  2 g Oral BID WC  ? ?Continuous Infusions: ?PRN Meds:.acetaminophen **OR** acetaminophen, haloperidol lactate, hydrALAZINE, ipratropium-albuterol, LORazepam, polyethylene glycol ? ?Antibiotics  :   ? ?Anti-infectives (From admission, onward)  ? ? None  ? ?  ? ? ? Time Spent in minutes  30 ? ? ?Susa Raring M.D on 12/10/2021 at 10:11 AM ? ?To page go to [email or web address]  ? ?[facility] -  Office  [phone] ? ?See all Orders from today for further details ? ? ? Objective:  ? ?Vitals:  ? 12/09/21 2042 12/09/21 2340 12/10/21 0418 12/10/21 0736  ?BP: (!) 151/77 126/77 133/80 (!) 119/96  ?Pulse: 83 83 72 63  ?Resp: 17 20 18 16   ?Temp: 99 ?F (37.2 ?C) 98 ?F (36.7 ?C) 98.3 ?F (36.8 ?C) 97.6 ?F (36.4 ?C)  ?TempSrc: Oral  Oral Oral  ?SpO2: 99% 97% 97% 98%  ?Weight:   104.8 kg   ?Height:      ? ? ?Wt Readings from Last 3 Encounters:  ?  12/10/21 104.8 kg  ?07/27/21 95.3 kg  ?07/22/20 98.2 kg  ? ? ? ?Intake/Output Summary (Last 24 hours) at 12/10/2021 1011 ?Last data filed at 12/09/2021 1039 ?Gross per 24 hour  ?Intake 240 ml  ?Output --  ?Net 240 ml  ? ? ? ?Physical Exam ? ?Awake Alert, No new F.N deficits, Normal affect ?[place].AT,PERRAL ?Supple Neck, No JVD,   ?Symmetrical Chest wall movement, Good air movement bilaterally,  CTAB ?RRR,No Gallops, Rubs or new Murmurs,  ?+ve B.Sounds, Abd Soft, No tenderness,   ?No Cyanosis, Clubbing or edema  ? ? Data Review:  ? ? ?CBC ?Recent Labs  ?Lab 12/05/21 ?2703 12/09/21 ?0411 12/10/21 ?0222  ?WBC 6.7 7.5 7.6  ?HGB 12.7* 11.7* 12.2*  ?HCT 38.5* 35.7* 36.3*  ?PLT 141* 135* 142*  ?MCV 69.1* 68.9* 68.8*  ?MCH 22.8* 22.6* 23.1*  ?MCHC 33.0 32.8 33.6  ?RDW 16.9* 16.2* 16.5*  ?LYMPHSABS  --  3.4 3.7  ?MONOABS  --  0.9 0.9  ?EOSABS  --  0.2 0.2  ?BASOSABS  --  0.0 0.0  ? ? ?Electrolytes ?Recent Labs  ?Lab 12/05/21 ?5009 12/09/21 ?0411 12/10/21 ?0222  ?NA 131* 128* 132*  ?K 3.8 3.5 4.2  ?CL 98 95* 98  ?CO2 26 23 26   ?GLUCOSE 114* 146* 102*  ?BUN 6 6 8   ?CREATININE 0.68 0.73 0.65  ?CALCIUM 8.8* 8.6* 8.8*  ?AST 19 21  --   ?ALT 12 14  --   ?ALKPHOS 63 60  --   ?BILITOT 0.7 0.2*  --   ?ALBUMIN 3.8 3.5  --   ?MG 1.9  --   --   ? ? ? ?Micro Results ?No results found for this or any previous visit (from the past 240 hour(s)). ? ?Radiology Reports ?No results found.  ? ? ?

## 2021-12-11 DIAGNOSIS — E871 Hypo-osmolality and hyponatremia: Secondary | ICD-10-CM | POA: Diagnosis not present

## 2021-12-11 NOTE — Progress Notes (Signed)
?                                  PROGRESS NOTE                                             ?                                                                                                                     ?                                         ? ? Patient Demographics:  ? ? Jim Dunn, is a 56 y.o. male, DOB - [birth year], [id number] ? ?Outpatient Primary MD for the patient is Pcp, No    LOS - 25  Admit date - 09/24/2021   ? ?Chief Complaint  ?Patient presents with  ? Altered Mental Status  ?  Abnormal labs  ?    ? ?Brief Narrative (HPI from H&P)   56 y.o. male with PMH significant for schizophrenia, chronic hyponatremia, seizure disorder, tobacco use, systolic congestive heart failure, COPD who was brought to ED on 09/24/2021 by his family after they were unable to take care of him.  He was found to be hyponatremic he had symptoms suggestive of severe primary polydipsia.  He also had seizures.  He was seen by psych and neurology.  Currently on fluid restriction, still noncompliant with fluid restriction, he is undergoing legal guardianship and awaiting placement. ? ? Subjective:  ? ?Patient in bed, appears comfortable, denies any headache, no fever, no chest pain or pressure, no shortness of breath , no abdominal pain. No new focal weakness. ? ? Assessment  & Plan :  ? ? 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  Continue salt tablets, continue Xanax per psych.   ? ?Dementia without behavioral disturbance ([place])  During initial admission over 1 year ago had formal neuropsychiatric evaluation by Dr. Sima Matas who diagnosed patient with dementia likely related to years of schizophrenia and nonadherence to medication regimen. SLUMS evaluation worsened since previous admission noting has declined from 18 to 12.  He lacks capacity to make  decisions With the assistance of APS interim already in ship has been completed since family not interested in taking on this role. Psych following undergoing legal guardianship evaluation and placement evaluation. ? ?Paranoid schizophrenia with hallucinations -  Schizophrenia symptoms and decompensation worsened by recurrent hyponatremia, Continue Seroquel and Invega, continue Xanax but increased from 0.25 to 0.5 mg since this will also help with tobacco cravings and in the past  this is helped with his psychogenic polydipsia. ? ?Seizure disorder ([place] due to hyponatremia.  Continue Depakote-History of nonadherence and valproic acid on 1/22 was 19, MRI revealed arachnoid cyst that is chronic in nature, last breakthrough seizure on 10/30/2021 due to hyponatremia.  Seen by neurology this admission ?  ?Mild asymptomatic thrombocytopenia.  No signs of bleeding or petechiae, likely due to Depakote, also Lyrica dose has been dropped, currently thrombocytopenia is minimal.  Will intermittently monitor.   ? ?Chronic systolic CHF (congestive heart failure) (HCC) EF 30% on last echocardiogram in January 2023.  Continue Coreg, digoxin and Entresto, as needed Lasix.  No acute issue.  Require outpatient cardiology follow-up postdischarge. ?  ?COPD (chronic obstructive pulmonary disease) (HCC) - Compensated, given Pneumovax immunization this admission. ? ?Peripheral neuropathy - Chronic issue and was present during previous admission, likely related to years of substance abuse ?Continue low-dose Lyrica since was effective previous ? ?Hyperlipidemia -  Continue lipitor ?  ? ?   ? ?Condition - Fair ? ?Family Communication  :  None present ? ?Code Status :  Full ? ?Consults  :  Psych, Neurology ? ?PUD Prophylaxis :  ? ? Procedures  :    ? ?EEG ? ?TTE ? ?   ? ?Disposition Plan  :   ? ?Status is: Inpatient ? ?DVT Prophylaxis  :   ? ?Place and maintain sequential compression device Start: 11/01/21 1711 ? ?Lab Results  ?Component  Value Date  ? PLT 142 (L) 12/10/2021  ? ? ?Diet :  ?Diet Order   ? ?       ?  Diet regular Room service appropriate? No; Fluid consistency: Thin; Fluid restriction: 1200 mL Fluid  Diet effective 1000       ?  ? ?  ?  ? ?  ?  ? ?Inpatient Medications ? ?Scheduled Meds: ? ALPRAZolam  0.5 mg Oral BID  ? atorvastatin  40 mg Oral Daily  ? carvedilol  12.5 mg Oral BID WC  ? digoxin  0.25 mg Oral Daily  ? divalproex  500 mg Oral BID  ? melatonin  3 mg Oral QHS  ? mirtazapine  15 mg Oral QHS  ? nicotine  14 mg Transdermal Daily  ? paliperidone  3 mg Oral Daily  ? pregabalin  25 mg Oral BID  ? QUEtiapine  50 mg Oral QHS  ? sacubitril-valsartan  1 tablet Oral BID  ? senna-docusate  2 tablet Oral BID  ? sodium chloride  2 g Oral BID WC  ? ?Continuous Infusions: ?PRN Meds:.acetaminophen **OR** acetaminophen, haloperidol lactate, hydrALAZINE, ipratropium-albuterol, LORazepam, polyethylene glycol ? ?Antibiotics  :   ? ?Anti-infectives (From admission, onward)  ? ? None  ? ?  ? ? ? Time Spent in minutes  30 ? ? ?Lala Lund M.D on 12/11/2021 at 10:44 AM ? ?To page go to [email or web address]  ? ?[facility] -  Office  [phone] ? ?See all Orders from today for further details ? ? ? Objective:  ? ?Vitals:  ? 12/10/21 2003 12/11/21 0038 12/11/21 0500 12/11/21 0955  ?BP: (!) 156/100 (!) 113/54 115/60 (!) 165/92  ?Pulse: 92 79 80 70  ?Resp: 20 20 20 19   ?Temp: 98.4 ?F (36.9 ?C) 98.2 ?F (36.8 ?C) 98 ?F (36.7 ?C) 97.7 ?F (36.5 ?C)  ?TempSrc: Oral Oral Oral Oral  ?SpO2: 99% 96% 99% 96%  ?Weight:   107.5 kg   ?Height:      ? ? ?Wt Readings from Last 3  Encounters:  ?12/11/21 107.5 kg  ?07/27/21 95.3 kg  ?07/22/20 98.2 kg  ? ? ? ?Intake/Output Summary (Last 24 hours) at 12/11/2021 1044 ?Last data filed at 12/11/2021 [id number] ?Gross per 24 hour  ?Intake 827 ml  ?Output --  ?Net 827 ml  ? ? ? ?Physical Exam ? ?Awake Alert, No new F.N deficits, Normal affect ?[employer].AT,PERRAL ?Supple Neck, No JVD,   ?Symmetrical Chest wall movement, Good air movement  bilaterally, CTAB ?RRR,No Gallops, Rubs or new Murmurs,  ?+ve B.Sounds, Abd Soft, No tenderness,   ?No Cyanosis, Clubbing or edema  ? ? ? Data Review:  ? ? ?CBC ?Recent Labs  ?Lab 12/05/21 ?[id number] 12/09/21 ?0411 12/10/21 ?0222  ?WBC 6.7 7.5 7.6  ?HGB 12.7* 11.7* 12.2*  ?HCT 38.5* 35.7* 36.3*  ?PLT 141* 135* 142*  ?MCV 69.1* 68.9* 68.8*  ?MCH 22.8* 22.6* 23.1*  ?MCHC 33.0 32.8 33.6  ?RDW 16.9* 16.2* 16.5*  ?LYMPHSABS  --  3.4 3.7  ?MONOABS  --  0.9 0.9  ?EOSABS  --  0.2 0.2  ?BASOSABS  --  0.0 0.0  ? ? ?Electrolytes ?Recent Labs  ?Lab 12/05/21 ?[id number] 12/09/21 ?0411 12/10/21 ?0222  ?NA 131* 128* 132*  ?K 3.8 3.5 4.2  ?CL 98 95* 98  ?CO2 26 23 26   ?GLUCOSE 114* 146* 102*  ?BUN 6 6 8   ?CREATININE 0.68 0.73 0.65  ?CALCIUM 8.8* 8.6* 8.8*  ?AST 19 21  --   ?ALT 12 14  --   ?ALKPHOS 63 60  --   ?BILITOT 0.7 0.2*  --   ?ALBUMIN 3.8 3.5  --   ?MG 1.9  --   --   ? ? ? ?Micro Results ?No results found for this or any previous visit (from the past 240 hour(s)). ? ?Radiology Reports ?No results found.  ? ? ?

## 2021-12-12 DIAGNOSIS — E871 Hypo-osmolality and hyponatremia: Secondary | ICD-10-CM | POA: Diagnosis not present

## 2021-12-12 NOTE — Progress Notes (Signed)
?Progress Note ?  ?  ?Patient: Jim Dunn [id number]:[id number] DOB: 02-23-1966 DOA: 09/24/2021     66 ?DOS: the patient was seen and examined on 12/02/2021 ?  ?Brief hospital course: ?Jim Dunn is a 56 y.o. male with PMH significant for schizophrenia, chronic hyponatremia, seizure disorder, tobacco use, systolic congestive heart failure, COPD who was brought to ED on 09/24/2021 by his family after they were unable to take care of him.  He was drinking a lot of water and trying to burn things. Previously he was staying at group home and was kicked out.   ?Neurology and psychiatry were consulted.   ?Hospital course remarkable for acute on chronic hyponatremia, breakthrough seizures. ?Prolonged hospitalization.   ?TOC following.  ?APS in process to determine if they will pursue guardianship ?  ?  ?Patient's behaviors have been stable.  Process was put in place to allow patient to briefly leave the floor given long length of stay.  Criteria were as follows: Must notify the nurse when he was leaving the floor, would not be allowed to leave the floor for longer than 30 minutes and he was not allowed to leave hospital grounds.  If any 1 of these criteria were broken off unit privileges would be revoked.  It is noted that eventual discharge plan would be to a group home although 24/7 monitoring of fluid intake would be difficult in that setting. ?  ?On the evening of 2/26 patient developed a grand mal seizure and it was noted his sodium had decreased significantly to 117.  It was felt that patient had been drinking fluids in his room and off the unit leading to hypervolemic hyponatremia.  He has a history of polydipsia and has had seizures related to this in the past.  He was placed on hypertonic saline and transferred to the ICU service.  Of note patient had been on Lasix, total fluid restriction versus only free water restriction and salt tablets.  As of now Lasix has been discontinued.  Fluid restriction pertains to only free  water and prior to ICU team assuming care orders changed to allow pt to consume Gatorade and other electrolyte-based fluids to help with excessive thirst. ?  ?  ?  ?  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  Current sodium stable at 131 ?Continue to hold scheduled Lasix.  Given one-time dose of Lasix by attending physician on 4/7.  Restriction will focus on free water only (including coffee).  Patient is allowed electrolyte-based fluids such as Gatorade as well as juices and milk that do not count towards the fluid restriction ?  ?@@Please  do not dc his xanax-this drug has been extremely helpful in the management of his psychogenic polydipsia both during this admission and during the previuos admission when I cared for him@@ ?  ?Thrombocytopenia ?Platelets have been stable throughout the hospitalization but over the past few days have slowly dropped with current reading 135,000 ?Potential offenders include Lyrica and Depakote ?We will go ahead and decrease Lyrica from 3 times daily to twice daily and repeat platelets in a.m. ?As long as platelets greater than 100,000 this will be acceptable if continues to drop will need to continue to taper down Lyrica and consider transitioning to alternate agent-patient did not receive adequate pain relief with Neurontin ?Would like to continue Depakote  since this is being utilized for seizures ? ?Seizure disorder ?Continue Depakote-History of nonadherence in the outpatient setting.  Valproic acid on 1/22 was 19 ?MRI: arachnoid cyst that is chronic in nature ?2/26 grand mal seizure secondary to sodium of 120-at the time valproic acid level 58 ?4/4 Platelets have decreased to 141,000-rpt CBC in am-4/7as long as platelets > 100,000 can cont Depakote ?   ?Elevated CK ?Resolved ?  ?Paranoid schizophrenia with hallucinations ?Schizophrenia decompensation worsened by recurrent hyponatremia at time of admission ?Continue Seroquel and Invega ?Continue Xanax for psychogenic polydipsia ?Off unit privileges accompanied by staff only ?  ?  ?Chronic systolic CHF (congestive heart failure)  ?Continue digoxin and Entresto  ?Lasix discontinued in context of recurrent hyponatremia.  Previously utilized prn for weight gain ?Digoxin level subtherapeutic as of 2/10 but this also appears to be a chronic issue.   ?Strict I/O and daily weights ?No consistent outpatient follow-up with cardiology.  ?Echocardiogram 09/26/2021 EF of 30-35% with moderately decreased systolic function and global hypokinesis. RV size and systolic function is normal. ?  ?Dementia without behavioral disturbance  ?During initial admission over 1 year ago had formal neuropsychiatric evaluation by Dr. Kieth Brightly who diagnosed patient with dementia 2/2 schizophrenia and nonadherence to medication regimen ?SLUMS evaluation worsened since previous admission noting has declined from 18 to 12.  He lacks capacity to make decisions ?Hospital legal team assisting with guardianship process.  Hearing planned for 01/25/2022 ?  ?Peripheral neuropathy ?Chronic issue and was present during previous admission ?Likely related to years of substance abuse ?Continue low-dose Lyrica since was effective previous ? ?A physical therapy consult is indicated based on the patient?s mobility assessment. ?  ?  ?Mobility Assessment (last 72 hours)   ?  ?  Mobility Assessment   ?  ?  Row Name 11/01/21 0730 10/31/21 0730 10/30/21 1128 10/29/21 2100 10/29/21 0808  ?  Does patient have an order for bedrest or is patient medically unstable No - Continue assessment No - Continue assessment No - Continue assessment No - Continue assessment No - Continue assessment  ?  What is the highest level of mobility based on the progressive mobility assessment?  Level 6 (Walks independently in room and hall) - Balance while walking in room without assist - Complete Level 6 (Walks independently in room and hall) - Balance while walking in room without assist - Complete Level 6 (Walks independently in room and hall) - Balance while walking in room without assist - Complete Level 6 (Walks independently in room and hall) - Balance while walking in room without assist - Complete Level 5 (Walks with assist in room/hall) - Balance while stepping forward/back and can walk in room with assist - Complete  ?  ?   ?  ?  ?   ?  ?Mixed hyperlipidemia ?Continue lipitor ?Apparently has been nonadherent with medicines prior to admission ?  ?COPD (chronic obstructive pulmonary disease) (HCC) ?Compensated ?Given Pneumovax immunization this admission ? ? ?Subjective:  ?Continues to remain comfortable.  Currently in bed.  No complaints. ?  ?Physical Exam: ?      ?Vitals:  ?  12/01/21 2304 12/02/21 0434 12/02/21 0905 12/02/21 1138  ?BP: (!) 145/87 106/74 (!) 166/88 (!) 156/88  ?Pulse: 79 68 68 65  ?Resp: 19 19 14 20   ?Temp: 98.4 ?F (36.9 ?C) 98.1 ?F (36.7 ?C) 98.2 ?F (36.8 ?C) 97.6 ?F (36.4 ?C)  ?TempSrc: Oral Oral Oral Oral  ?SpO2: 97% 98% 100% 99%  ?Weight:          ?  Height:          ?  ?Gen: No acute distress, calm ?Pulm: Stable on room air, posterior lung sounds clear.  Respiratory effort normal ?Cardiac: Regular pulse,S1-S2, no peripheral edema, normotensive ?Abd: Soft nontender, nondistended, normoactive bowel sounds. LBM 4/8 ?Neuro: CN II through XII grossly intact, ambulates independently sensation intact.   ?Psych: Alert and oriented.  Pleasant affect. ?  ?  ?  ?Data Reviewed: ?  ?Results reviewed ?  ?Family Communication:  ?Patient ?  ?Disposition: ?Remains inpatient appropriate because:  ?Unsafe DC plan ?  ?Planned Discharge Destination:  ?Barriers to discharge: APS to determine if they will pursue guardianship. Family refusing to take pt because stating they cannot manage him noting  refuses to take meds ?  ?Medically stable ?Yes ?  ?COVID vaccination status:  ?Pfizer 07/20/2020 ?  ?Consultants: ?Psychiatry ?Neurology ?Procedures: ?EEG ?Echocardiogram ?Antibiotics: ?None ?  ?  ?  ?  ?Tim

## 2021-12-13 DIAGNOSIS — E871 Hypo-osmolality and hyponatremia: Secondary | ICD-10-CM | POA: Diagnosis not present

## 2021-12-13 NOTE — Progress Notes (Signed)
?Progress Note ?  ?  ?Patient: Jim Dunn [id number]:[id number] DOB: December 28, 1965 DOA: 09/24/2021     66 ?DOS: the patient was seen and examined on 12/02/2021 ?  ?Brief hospital course: ?Jim Dunn is a 56 y.o. male with PMH significant for schizophrenia, chronic hyponatremia, seizure disorder, tobacco use, systolic congestive heart failure, COPD who was brought to ED on 09/24/2021 by his family after they were unable to take care of him.  He was drinking a lot of water and trying to burn things. Previously he was staying at group home and was kicked out.   ?Neurology and psychiatry were consulted.   ?Hospital course remarkable for acute on chronic hyponatremia, breakthrough seizures. ?Prolonged hospitalization.   ?TOC following.  ?APS in process to determine if they will pursue guardianship ?  ?  ?Patient's behaviors have been stable.  Process was put in place to allow patient to briefly leave the floor given long length of stay.  Criteria were as follows: Must notify the nurse when he was leaving the floor, would not be allowed to leave the floor for longer than 30 minutes and he was not allowed to leave hospital grounds.  If any 1 of these criteria were broken off unit privileges would be revoked.  It is noted that eventual discharge plan would be to a group home although 24/7 monitoring of fluid intake would be difficult in that setting. ?  ?On the evening of 2/26 patient developed a grand mal seizure and it was noted his sodium had decreased significantly to 117.  It was felt that patient had been drinking fluids in his room and off the unit leading to hypervolemic hyponatremia.  He has a history of polydipsia and has had seizures related to this in the past.  He was placed on hypertonic saline and transferred to the ICU service.  Of note patient had been on Lasix, total fluid restriction versus only free water restriction and salt tablets.  As of now Lasix has been discontinued.  Fluid restriction pertains to only free  water and prior to ICU team assuming care orders changed to allow pt to consume Gatorade and other electrolyte-based fluids to help with excessive thirst. ?  ?  ?  ?  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  Current sodium stable at 132-repeat electrolyte panel a.m. 4/12 ?Continue to hold scheduled Lasix. ?  ?@@Please  do not dc his xanax-this drug has been extremely helpful in the management of his psychogenic polydipsia both during this admission and during the previuos admission when I cared for him@@ ?  ?Thrombocytopenia ?Platelets have been stable throughout the hospitalization but over the past few days have slowly dropped with current reading 135,000 ?Potential offenders include Lyrica and Depakote ?We will go ahead and decrease Lyrica from 3 times daily to twice daily and repeat platelets in a.m. ?As long as platelets greater than 100,000 this will be acceptable if continues to drop will need to continue to taper down Lyrica and consider transitioning to alternate agent-patient did not receive adequate pain relief with Neurontin ?Would like to continue Depakote since this is being utilized for seizures ? ?Seizure disorder ?Continue Depakote-History of nonadherence in the outpatient setting.  Valproic acid on 1/22 was 19 ?MRI: arachnoid cyst that is chronic in nature ?2/26 grand mal seizure secondary to sodium  of 120-at the time valproic acid level 58 ?4/4 Platelets have decreased to 141,000-rpt CBC in am-4/7as long as platelets > 100,000 can cont Depakote-repeat CBC a.m./12 ?  ?Elevated CK ?Resolved ?  ?Paranoid schizophrenia with hallucinations ?Schizophrenia decompensation worsened by recurrent hyponatremia at time of admission ?Continue Seroquel and Invega ?Continue Xanax for  psychogenic polydipsia ?Off unit privileges accompanied by staff only ?  ?  ?Chronic systolic CHF (congestive heart failure)  ?Continue digoxin and Entresto  ?Lasix discontinued in context of recurrent hyponatremia.  Previously utilized prn for weight gain ?Digoxin level subtherapeutic as of 2/10 but this also appears to be a chronic issue.   ?Strict I/O and daily weights ?No consistent outpatient follow-up with cardiology.  ?Echocardiogram 09/26/2021 EF of 30-35% with moderately decreased systolic function and global hypokinesis. RV size and systolic function is normal. ?  ?Dementia without behavioral disturbance  ?During initial admission over 1 year ago had formal neuropsychiatric evaluation by Dr. Kieth Brightly who diagnosed patient with dementia 2/2 schizophrenia and nonadherence to medication regimen ?SLUMS evaluation worsened since previous admission noting has declined from 18 to 12.  He lacks capacity to make decisions ?Hospital legal team assisting with guardianship process.  Hearing planned for 01/25/2022 ?  ?Peripheral neuropathy ?Chronic issue and was present during previous admission ?Likely related to years of substance abuse ?Continue low-dose Lyrica since was effective previous ? ?A physical therapy consult is indicated based on the patient?s mobility assessment. ?  ?  ?Mobility Assessment (last 72 hours)   ?  ?  Mobility Assessment   ?  ?  Row Name 11/01/21 0730 10/31/21 0730 10/30/21 1128 10/29/21 2100 10/29/21 0808  ?  Does patient have an order for bedrest or is patient medically unstable No - Continue assessment No - Continue assessment No - Continue assessment No - Continue assessment No - Continue assessment  ?  What is the highest level of mobility based on the progressive mobility assessment? Level 6 (Walks independently in room and hall) - Balance while walking in room without assist - Complete Level 6 (Walks independently in room and hall) - Balance while walking in room without assist -  Complete Level 6 (Walks independently in room and hall) - Balance while walking in room without assist - Complete Level 6 (Walks independently in room and hall) - Balance while walking in room without assist - Complete Level 5 (Walks with assist in room/hall) - Balance while stepping forward/back and can walk in room with assist - Complete  ?  ?   ?  ?  ?   ?  ?Mixed hyperlipidemia ?Continue lipitor ?Apparently has been nonadherent with medicines prior to admission ?  ?COPD (chronic obstructive pulmonary disease) (HCC) ?Compensated ?Given Pneumovax immunization this admission ? ? ?Subjective:  ? ?  ?Physical Exam: ?      ?Vitals:  ?  12/01/21 2304 12/02/21 0434 12/02/21 0905 12/02/21 1138  ?BP: (!) 145/87 106/74 (!) 166/88 (!) 156/88  ?Pulse: 79 68 68 65  ?Resp: 19 19 14 20   ?Temp: 98.4 ?F (36.9 ?C) 98.1 ?F (36.7 ?C) 98.2 ?F (36.8 ?C) 97.6 ?F (36.4 ?C)  ?TempSrc: Oral Oral Oral Oral  ?SpO2: 97% 98% 100% 99%  ?Weight:          ?Height:          ?  ?Gen: No acute distress, calm ?Pulm: Stable on room air, posterior lung sounds clear.  Respiratory effort normal ?Cardiac: Regular pulse,S1-S2, no peripheral edema, normotensive ?Abd: Soft nontender, nondistended,  normoactive bowel sounds. LBM 4/8 ?Neuro: CN II through XII grossly intact, ambulates independently sensation intact.   ?Psych: Alert and oriented.  Pleasant affect. ?  ?  ?  ?Data Reviewed: ?  ?Results reviewed ?  ?Family Communication:  ?Patient ?  ?Disposition: ?Remains inpatient appropriate because:  ?Unsafe DC plan ?  ?Planned Discharge Destination:  ?Barriers to discharge: APS to determine if they will pursue guardianship. Family refusing to take pt because stating they cannot manage him noting refuses to take meds ?  ?Medically stable ?Yes ?  ?COVID vaccination status:  ?Pfizer 07/20/2020 ?  ?Consultants: ?Psychiatry ?Neurology ?Procedures: ?EEG ?Echocardiogram ?Antibiotics: ?None ?  ?  ?  ?  ?Time spent: 10 minutes ?  ?Author: ?Junious Silk, NP ?12/02/2021  12:55 PM ?

## 2021-12-14 DIAGNOSIS — E871 Hypo-osmolality and hyponatremia: Secondary | ICD-10-CM | POA: Diagnosis not present

## 2021-12-14 LAB — CBC WITH DIFFERENTIAL/PLATELET
Abs Immature Granulocytes: 0.09 10*3/uL — ABNORMAL HIGH (ref 0.00–0.07)
Basophils Absolute: 0 10*3/uL (ref 0.0–0.1)
Basophils Relative: 0 %
Eosinophils Absolute: 0.2 10*3/uL (ref 0.0–0.5)
Eosinophils Relative: 2 %
HCT: 37.1 % — ABNORMAL LOW (ref 39.0–52.0)
Hemoglobin: 12.2 g/dL — ABNORMAL LOW (ref 13.0–17.0)
Immature Granulocytes: 1 %
Lymphocytes Relative: 50 %
Lymphs Abs: 3.7 10*3/uL (ref 0.7–4.0)
MCH: 22.8 pg — ABNORMAL LOW (ref 26.0–34.0)
MCHC: 32.9 g/dL (ref 30.0–36.0)
MCV: 69.2 fL — ABNORMAL LOW (ref 80.0–100.0)
Monocytes Absolute: 1 10*3/uL (ref 0.1–1.0)
Monocytes Relative: 13 %
Neutro Abs: 2.5 10*3/uL (ref 1.7–7.7)
Neutrophils Relative %: 34 %
Platelets: 168 10*3/uL (ref 150–400)
RBC: 5.36 MIL/uL (ref 4.22–5.81)
RDW: 16.6 % — ABNORMAL HIGH (ref 11.5–15.5)
WBC: 7.4 10*3/uL (ref 4.0–10.5)
nRBC: 0 % (ref 0.0–0.2)

## 2021-12-14 LAB — BASIC METABOLIC PANEL
Anion gap: 8 (ref 5–15)
BUN: 11 mg/dL (ref 6–20)
CO2: 23 mmol/L (ref 22–32)
Calcium: 8.9 mg/dL (ref 8.9–10.3)
Chloride: 102 mmol/L (ref 98–111)
Creatinine, Ser: 0.58 mg/dL — ABNORMAL LOW (ref 0.61–1.24)
GFR, Estimated: >60 mL/min (ref >60)
Glucose, Bld: 97 mg/dL (ref 70–99)
Potassium: 4.1 mmol/L (ref 3.5–5.1)
Sodium: 133 mmol/L — ABNORMAL LOW (ref 135–145)

## 2021-12-14 NOTE — TOC Progression Note (Signed)
Transition of Care (TOC) - Progression Note  ? ? ?Patient Details  ?Name: Jim Dunn ?MRN: [id number] ?Date of Birth: 1966-02-06 ? ?Transition of Care (TOC) CM/SW Contact  ?Curlene Labrum, RN ?Phone Number: ?12/14/2021, 9:09 AM ? ?Clinical Narrative:    ?CM met with the patient at the bedside.  The patient was served court papers on 12/13/2021 - for scheduled court hearing on 01/25/2022 at 1500 - Document for Notice of Hearing on Incompetence and order appointing Guardian Ad Litem.  The hearing is scheduled to occur through Remote Web-ex and patient will be provided with access through [facility] Tablet to participate. ? ?Attorney Guardian Ad Litem - Michaell Cowing [phone]. ? ?CM and MSW with DTP Team will continue to follow the patient for [facility] needs  - pending court date via web-ex on 01/25/2022 at 1500. ? ? ?Expected Discharge Plan: Home/Self Care ?Barriers to Discharge: Other (must enter comment), Continued Medical Work up (family declining to continue to care for pt) ? ?Expected Discharge Plan and Services ?Expected Discharge Plan: Home/Self Care ?In-house Referral: Clinical Social Work ?  ?Post Acute Care Choice:  (TBD) ?Living arrangements for the past 2 months: [place] ?                ?  ?  ?  ?  ?  ?  ?  ?  ?  ?  ? ? ?Social Determinants of Health (SDOH) Interventions ?  ? ?Readmission Risk Interventions ? ?  01/23/2020  ?  4:46 PM  ?Readmission Risk Prevention Plan  ?Transportation Screening Complete  ?[place] or Home Care Consult Complete  ?Social Work Consult for [place] Planning/Counseling Complete  ?Palliative Care Screening Not Applicable  ? ? ?

## 2021-12-14 NOTE — Progress Notes (Signed)
?Progress Note ?  ?  ?Patient: Jim Dunn FTD:[id number] DOB: [birth year] DOA: 09/24/2021     66 ?DOS: the patient was seen and examined on 12/02/2021 ?  ?Brief hospital course: ?Jim Dunn is a 56 y.o. male with PMH significant for schizophrenia, chronic hyponatremia, seizure disorder, tobacco use, systolic congestive heart failure, COPD who was brought to ED on 09/24/2021 by his family after they were unable to take care of him.  He was drinking a lot of water and trying to burn things. Previously he was staying at group home and was kicked out.   ?Neurology and psychiatry were consulted.   ?Hospital course remarkable for acute on chronic hyponatremia, breakthrough seizures. ?Prolonged hospitalization.   ?TOC following.  ?APS in process to determine if they will pursue guardianship ?  ?  ?Patient's behaviors have been stable.  Process was put in place to allow patient to briefly leave the floor given long length of stay.  Criteria were as follows: Must notify the nurse when he was leaving the floor, would not be allowed to leave the floor for longer than 30 minutes and he was not allowed to leave hospital grounds.  If any 1 of these criteria were broken off unit privileges would be revoked.  It is noted that eventual discharge plan would be to a group home although 24/7 monitoring of fluid intake would be difficult in that setting. ?  ?On the evening of 2/26 patient developed a grand mal seizure and it was noted his sodium had decreased significantly to 117.  It was felt that patient had been drinking fluids in his room and off the unit leading to hypervolemic hyponatremia.  He has a history of polydipsia and has had seizures related to this in the past.  He was placed on hypertonic saline and transferred to the ICU service.  Of note patient had been on Lasix, total fluid restriction versus only free water restriction and salt tablets.  As of now Lasix has been discontinued.  Fluid restriction pertains to only free  water and prior to ICU team assuming care orders changed to allow pt to consume Gatorade and other electrolyte-based fluids to help with excessive thirst. ?  ?  ?  ?  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  Current sodium stable at 132-repeat electrolyte panel a.m. 4/12 sodium remained stable at 33 ?Continue to hold scheduled Lasix. ?  ?@@Please  do not dc his xanax-this drug has been extremely helpful in the management of his psychogenic polydipsia both during this admission and during the previuos admission when I cared for him@@ ?  ?Thrombocytopenia ?Platelets have been stable throughout the hospitalization but over the past few days have slowly dropped with current reading 135,000 ?Potential offenders include Lyrica and Depakote ?Lyrica decreased from 3 times daily to twice daily.  As of 4/12 platelets have normalized at 168,000 ? ?Seizure disorder ?Continue Depakote-History of nonadherence in the outpatient setting.  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  ?Elevated CK ?Resolved ?  ?Paranoid schizophrenia with hallucinations ?Schizophrenia decompensation worsened by recurrent hyponatremia at  time of admission ?Continue Seroquel and Invega ?Continue Xanax for psychogenic polydipsia ?Off unit privileges accompanied by staff only ?  ?  ?Chronic systolic CHF (congestive heart failure)  ?Continue digoxin and Entresto  ?Lasix discontinued in context of recurrent hyponatremia.  Previously utilized prn for weight gain ?Digoxin level subtherapeutic  as of 2/10 but this also appears to be a chronic issue.   ?Strict I/O and daily weights ?No consistent outpatient follow-up with cardiology.  ?Echocardiogram 09/26/2021 EF of 30-35% with moderately decreased systolic function and global hypokinesis. RV size and systolic function is normal. ?  ?Dementia without behavioral disturbance  ?During initial admission over 1 year ago had formal neuropsychiatric evaluation by Dr. Kieth Brightly who diagnosed patient with dementia 2/2 schizophrenia and nonadherence to medication regimen ?SLUMS evaluation worsened since previous admission noting has declined from 18 to 12.  He lacks capacity to make decisions ?Hospital legal team assisting with guardianship process.  Hearing planned for 01/25/2022 ?  ?Peripheral neuropathy ?Chronic issue and was present during previous admission ?Likely related to years of substance abuse ?Continue low-dose Lyrica since was effective previous ? ?A physical therapy consult is indicated based on the patient?s mobility assessment. ?  ?  ?Mobility Assessment (last 72 hours)   ?  ?  Mobility Assessment   ?  ?  Row Name 11/01/21 0730 10/31/21 0730 10/30/21 1128 10/29/21 2100 10/29/21 0808  ?  Does patient have an order for bedrest or is patient medically unstable No - Continue assessment No - Continue assessment No - Continue assessment No - Continue assessment No - Continue assessment  ?  What is the highest level of mobility based on the progressive mobility assessment? Level 6 (Walks independently in room and hall) - Balance while walking in room without assist - Complete Level 6 (Walks independently in room and hall) - Balance while walking in room without assist - Complete Level 6 (Walks independently in room and hall) - Balance while walking in room without assist - Complete Level 6 (Walks independently in room and hall) - Balance while walking in room without assist - Complete Level 5 (Walks with assist in room/hall) - Balance while stepping  forward/back and can walk in room with assist - Complete  ?  ?   ?  ?  ?   ?  ?Mixed hyperlipidemia ?Continue lipitor ?Apparently has been nonadherent with medicines prior to admission ?  ?COPD (chronic obstructive pulmonary disease) (HCC) ?Compensated ?Given Pneumovax immunization this admission ? ? ?Subjective:  ?Remains calm.  Laying in bed looking at videos on his phone.  No complaints.  Received paperwork from [facility] deputy on 4/11 regarding pending guardianship hearing. ?  ?Physical Exam: ?      ?Vitals:  ?  12/01/21 2304 12/02/21 0434 12/02/21 0905 12/02/21 1138  ?BP: (!) 145/87 106/74 (!) 166/88 (!) 156/88  ?Pulse: 79 68 68 65  ?Resp: 19 19 14 20   ?Temp: 98.4 ?F (36.9 ?C) 98.1 ?F (36.7 ?C) 98.2 ?F (36.8 ?C) 97.6 ?F (36.4 ?C)  ?TempSrc: Oral Oral Oral Oral  ?SpO2: 97% 98% 100% 99%  ?Weight:          ?Height:          ?  ?Gen: No acute distress, calm ?Pulm: Stable on room air, posterior lung sounds clear.  Respiratory effort normal ?Cardiac: Regular pulse,S1-S2, no peripheral edema, normotensive ?Abd: Soft nontender, nondistended, normoactive bowel sounds. LBM 4/10 ?Neuro: CN II through XII grossly intact, ambulates independently sensation intact.   ?Psych:  Alert and oriented.  Pleasant affect. ?  ?  ?  ?Data Reviewed: ?  ?Results reviewed ?  ?Family Communication:  ?Patient ?  ?Disposition: ?Remains inpatient appropriate because:  ?Unsafe DC plan ?  ?Planned Discharge Destination:  ?Barriers to discharge: APS to determine if they will pursue guardianship. Family refusing to take pt because stating they cannot manage him noting refuses to take meds ?  ?Medically stable ?Yes ?  ?COVID vaccination status:  ?Pfizer 07/20/2020 ?  ?Consultants: ?Psychiatry ?Neurology ?Procedures: ?EEG ?Echocardiogram ?Antibiotics: ?None ?  ?  ?  ?  ?Time spent: 10 minutes ?  ?Author: ?Junious Silk, NP ?12/02/2021 12:55 PM ?

## 2021-12-15 DIAGNOSIS — E871 Hypo-osmolality and hyponatremia: Secondary | ICD-10-CM | POA: Diagnosis not present

## 2021-12-15 NOTE — Progress Notes (Signed)
?Progress Note ?  ?  ?Patient: Jim Dunn FTD:[id number] DOB: [birth year] DOA: 09/24/2021     66 ?DOS: the patient was seen and examined on 12/02/2021 ?  ?Brief hospital course: ?Jim Dunn is a 56 y.o. male with PMH significant for schizophrenia, chronic hyponatremia, seizure disorder, tobacco use, systolic congestive heart failure, COPD who was brought to ED on 09/24/2021 by his family after they were unable to take care of him.  He was drinking a lot of water and trying to burn things. Previously he was staying at group home and was kicked out.   ?Neurology and psychiatry were consulted.   ?Hospital course remarkable for acute on chronic hyponatremia, breakthrough seizures. ?Prolonged hospitalization.   ?TOC following.  ?APS in process to determine if they will pursue guardianship ?  ?  ?Patient's behaviors have been stable.  Process was put in place to allow patient to briefly leave the floor given long length of stay.  Criteria were as follows: Must notify the nurse when he was leaving the floor, would not be allowed to leave the floor for longer than 30 minutes and he was not allowed to leave hospital grounds.  If any 1 of these criteria were broken off unit privileges would be revoked.  It is noted that eventual discharge plan would be to a group home although 24/7 monitoring of fluid intake would be difficult in that setting. ?  ?On the evening of 2/26 patient developed a grand mal seizure and it was noted his sodium had decreased significantly to 117.  It was felt that patient had been drinking fluids in his room and off the unit leading to hypervolemic hyponatremia.  He has a history of polydipsia and has had seizures related to this in the past.  He was placed on hypertonic saline and transferred to the ICU service.  Of note patient had been on Lasix, total fluid restriction versus only free water restriction and salt tablets.  As of now Lasix has been discontinued.  Fluid restriction pertains to only free  water and prior to ICU team assuming care orders changed to allow pt to consume Gatorade and other electrolyte-based fluids to help with excessive thirst. ?  ?  ?  ?  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  Current sodium stable at 132-repeat electrolyte panel a.m. 4/12 sodium remained stable at 33 ?Continue to hold scheduled Lasix. ?  ?@@Please  do not dc his xanax-this drug has been extremely helpful in the management of his psychogenic polydipsia both during this admission and during the previuos admission when I cared for him@@ ?  ?Thrombocytopenia ?Platelets have been stable throughout the hospitalization but over the past few days have slowly dropped with current reading 135,000 ?Potential offenders include Lyrica and Depakote ?Lyrica decreased from 3 times daily to twice daily.  As of 4/12 platelets have normalized at 168,000 ? ?Seizure disorder ?Continue Depakote-History of nonadherence in the outpatient setting.  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  ?Elevated CK ?Resolved ?  ?Paranoid schizophrenia with hallucinations ?Schizophrenia decompensation worsened by recurrent hyponatremia at  time of admission ?Continue Seroquel and Invega ?Continue Xanax for psychogenic polydipsia ?Off unit privileges accompanied by staff only ?  ?  ?Chronic systolic CHF (congestive heart failure)  ?Continue digoxin and Entresto  ?Lasix discontinued in context of recurrent hyponatremia.  Previously utilized prn for weight gain ?Digoxin level subtherapeutic  as of 2/10 but this also appears to be a chronic issue.   ?Strict I/O and daily weights ?No consistent outpatient follow-up with cardiology.  ?Echocardiogram 09/26/2021 EF of 30-35% with moderately decreased systolic function and global hypokinesis. RV size and systolic function is normal. ?  ?Dementia without behavioral disturbance  ?During initial admission over 1 year ago had formal neuropsychiatric evaluation by Dr. Kieth Brightly who diagnosed patient with dementia 2/2 schizophrenia and nonadherence to medication regimen ?SLUMS evaluation worsened since previous admission noting has declined from 18 to 12.  He lacks capacity to make decisions ?Hospital legal team assisting with guardianship process.  Hearing planned for 01/25/2022 ?  ?Peripheral neuropathy ?Chronic issue and was present during previous admission ?Likely related to years of substance abuse ?Continue low-dose Lyrica since was effective previous ? ?A physical therapy consult is indicated based on the patient?s mobility assessment. ?  ?  ?Mobility Assessment (last 72 hours)   ?  ?  Mobility Assessment   ?  ?  Row Name 11/01/21 0730 10/31/21 0730 10/30/21 1128 10/29/21 2100 10/29/21 0808  ?  Does patient have an order for bedrest or is patient medically unstable No - Continue assessment No - Continue assessment No - Continue assessment No - Continue assessment No - Continue assessment  ?  What is the highest level of mobility based on the progressive mobility assessment? Level 6 (Walks independently in room and hall) - Balance while walking in room without assist - Complete Level 6 (Walks independently in room and hall) - Balance while walking in room without assist - Complete Level 6 (Walks independently in room and hall) - Balance while walking in room without assist - Complete Level 6 (Walks independently in room and hall) - Balance while walking in room without assist - Complete Level 5 (Walks with assist in room/hall) - Balance while stepping  forward/back and can walk in room with assist - Complete  ?  ?   ?  ?  ?   ?  ?Mixed hyperlipidemia ?Continue lipitor ?Apparently has been nonadherent with medicines prior to admission ?  ?COPD (chronic obstructive pulmonary disease) (HCC) ?Compensated ?Given Pneumovax immunization this admission ? ? ?Subjective:  ?Served walking in hallway and at nurses station and finally back to his room before I came in to examine him.  Sitting up in chair without complaints. ?  ?Physical Exam: ?      ?Vitals:  ?  12/01/21 2304 12/02/21 0434 12/02/21 0905 12/02/21 1138  ?BP: (!) 145/87 106/74 (!) 166/88 (!) 156/88  ?Pulse: 79 68 68 65  ?Resp: 19 19 14 20   ?Temp: 98.4 ?F (36.9 ?C) 98.1 ?F (36.7 ?C) 98.2 ?F (36.8 ?C) 97.6 ?F (36.4 ?C)  ?TempSrc: Oral Oral Oral Oral  ?SpO2: 97% 98% 100% 99%  ?Weight:          ?Height:          ?  ?Gen: Remains calm and cooperative.  No acute distress ?Pulm: Bilateral lung sounds are clear to auscultation, stable on room air, normal respiratory effort ?Cardiac: S1-S2, regular pulse, no peripheral edema, normotensive ?Abd: Soft nontender, nondistended, normoactive bowel sounds. LBM 4/11 ?Neuro: CN II through XII grossly  intact, ambulates independently sensation intact.   ?Psych: Alert and oriented.  Pleasant affect. ?  ?  ?  ?Data Reviewed: ?  ?Results reviewed ?  ?Family Communication:  ?Patient ?  ?Disposition: ?Remains inpatient appropriate because:  ?Unsafe DC plan ?  ?Planned Discharge Destination:  ?Barriers to discharge: APS to determine if they will pursue guardianship. Family refusing to take pt because stating they cannot manage him noting refuses to take meds ?  ?Medically stable ?Yes ?  ?COVID vaccination status:  ?Pfizer 07/20/2020 ?  ?Consultants: ?Psychiatry ?Neurology ?Procedures: ?EEG ?Echocardiogram ?Antibiotics: ?None ?  ?  ?  ?  ?Time spent: 10 minutes ?  ?Author: ?Junious Silk, NP ?12/02/2021 12:55 PM ?

## 2021-12-16 DIAGNOSIS — E871 Hypo-osmolality and hyponatremia: Secondary | ICD-10-CM | POA: Diagnosis not present

## 2021-12-16 MED ORDER — PREGABALIN 25 MG PO CAPS
25.0000 mg | ORAL_CAPSULE | Freq: Every day | ORAL | Status: DC
  Administered 2021-12-16 – 2022-02-24 (×71): 25 mg via ORAL
  Filled 2021-12-16 (×71): qty 1

## 2021-12-16 MED ORDER — PALIPERIDONE ER 6 MG PO TB24
6.0000 mg | ORAL_TABLET | Freq: Every day | ORAL | Status: DC
  Administered 2021-12-16 – 2022-04-07 (×113): 6 mg via ORAL
  Filled 2021-12-16 (×113): qty 1

## 2021-12-16 NOTE — Progress Notes (Signed)
?Progress Note ?  ?  ?Patient: Jim Dunn LHT:[id number] DOB: February 05, 1966 DOA: 09/24/2021     56 ?DOS: the patient was seen and examined on 12/02/2021 ?  ?Brief hospital course: ?Kerron Sedano is a 56 y.o. male with PMH significant for schizophrenia, chronic hyponatremia, seizure disorder, tobacco use, systolic congestive heart failure, COPD who was brought to ED on 09/24/2021 by his family after they were unable to take care of him.  He was drinking a lot of water and trying to burn things. Previously he was staying at group home and was kicked out.   ?Neurology and psychiatry were consulted.   ?Hospital course remarkable for acute on chronic hyponatremia, breakthrough seizures. ?Prolonged hospitalization.   ?TOC following.  ?APS in process to determine if they will pursue guardianship ?  ?  ?Patient's behaviors have been stable.  Process was put in place to allow patient to briefly leave the floor given long length of stay.  Criteria were as follows: Must notify the nurse when he was leaving the floor, would not be allowed to leave the floor for longer than 30 minutes and he was not allowed to leave hospital grounds.  If any 1 of these criteria were broken off unit privileges would be revoked.  It is noted that eventual discharge plan would be to a group home although 24/7 monitoring of fluid intake would be difficult in that setting. ?  ?On the evening of 2/26 patient developed a grand mal seizure and it was noted his sodium had decreased significantly to 117.  It was felt that patient had been drinking fluids in his room and off the unit leading to hypervolemic hyponatremia.  He has a history of polydipsia and has had seizures related to this in the past.  He was placed on hypertonic saline and transferred to the ICU service.  Of note patient had been on Lasix, total fluid restriction versus only free water restriction and salt tablets.  As of now Lasix has been discontinued.  Fluid restriction pertains to only free  water and prior to ICU team assuming care orders changed to allow pt to consume Gatorade and other electrolyte-based fluids to help with excessive thirst. ?  ?  ?  ?  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  Current sodium stable at 132-repeat electrolyte panel a.m. 4/12 sodium remained stable at 33 ?Continue to hold scheduled Lasix. ?  ?@@Please  do not dc his xanax-this drug has been extremely helpful in the management of his psychogenic polydipsia both during this admission and during the previuos admission when I cared for him@@ ?  ?Thrombocytopenia ?Platelets have been stable throughout the hospitalization but over the past few days have slowly dropped with current reading 135,000 ?Lyrica decreased and platelets have normalized to 168,000 ? ?Seizure disorder ?Continue Depakote-History of nonadherence in the outpatient setting.  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  ?Elevated CK ?Resolved ?  ?Paranoid schizophrenia with hallucinations ?Schizophrenia decompensation worsened by recurrent hyponatremia at time of admission ?Continue Seroquel and Invega-given apparent new onset of some hallucination activity will increase  Invega from 3 mg to 6 mg and will decrease his Lyrica from twice daily to at bedtime only ?Continue Xanax for psychogenic polydipsia ?Off unit privileges accompanied by staff only ?  ?  ?Chronic systolic CHF (congestive heart failure)  ?Continue digoxin and Entresto  ?Lasix discontinued in context of recurrent hyponatremia.   Previously utilized prn for weight gain ?Digoxin level subtherapeutic as of 2/10 but this also appears to be a chronic issue.   ?Strict I/O and daily weights ?No consistent outpatient follow-up with cardiology.  ?Echocardiogram 09/26/2021 EF of 30-35% with moderately decreased systolic function and global hypokinesis. RV size and systolic function is normal. ?  ?Dementia without behavioral disturbance  ?During initial admission over 1 year ago had formal neuropsychiatric evaluation by Dr. Kieth Brightly who diagnosed patient with dementia 2/2 schizophrenia and nonadherence to medication regimen ?SLUMS evaluation worsened since previous admission noting has declined from 18 to 12.  He lacks capacity to make decisions ?Hospital legal team assisting with guardianship process.  Hearing planned for 01/25/2022 ?  ?Peripheral neuropathy ?Chronic issue and was present during previous admission ?Likely related to years of substance abuse ?Given hallucinatory symptoms will decrease Lyrica from twice daily to at bedtime only ? ?A physical therapy consult is indicated based on the patient?s mobility assessment. ?  ?  ?Mobility Assessment (last 72 hours)   ?  ?  Mobility Assessment   ?  ?  Row Name 11/01/21 0730 10/31/21 0730 10/30/21 1128 10/29/21 2100 10/29/21 0808  ?  Does patient have an order for bedrest or is patient medically unstable No - Continue assessment No - Continue assessment No - Continue assessment No - Continue assessment No - Continue assessment  ?  What is the highest level of mobility based on the progressive mobility assessment? Level 6 (Walks independently in room and hall) - Balance while walking in room without assist - Complete Level 6 (Walks independently in room and hall) - Balance while walking in room without assist - Complete Level 6 (Walks independently in room and hall) - Balance while walking in room without assist - Complete Level 6 (Walks independently in room and hall) - Balance while walking in  room without assist - Complete Level 5 (Walks with assist in room/hall) - Balance while stepping forward/back and can walk in room with assist - Complete  ?  ?   ?  ?  ?   ?  ?Mixed hyperlipidemia ?Continue lipitor ?Apparently has been nonadherent with medicines prior to admission ?  ?COPD (chronic obstructive pulmonary disease) (HCC) ?Compensated ?Given Pneumovax immunization this admission ? ? ?Subjective:  ?Patient tells me that last night early this morning he was seeing things in his mind that he described as "mind to becoming a TV set" ?  ?Physical Exam: ?      ?Vitals:  ?  12/01/21 2304 12/02/21 0434 12/02/21 0905 12/02/21 1138  ?BP: (!) 145/87 106/74 (!) 166/88 (!) 156/88  ?Pulse: 79 68 68 65  ?Resp: 19 19 14 20   ?Temp: 98.4 ?F (36.9 ?C) 98.1 ?F (36.7 ?C) 98.2 ?F (36.8 ?C) 97.6 ?F (36.4 ?C)  ?TempSrc: Oral Oral Oral Oral  ?SpO2: 97% 98% 100% 99%  ?Weight:          ?Height:          ?  ?Gen: Remains calm and cooperative.  No acute distress ?Pulm: Bilateral lung sounds are clear to auscultation, stable on room air, normal respiratory effort ?Cardiac: S1-S2, regular pulse, no peripheral edema,  normotensive ?Abd: Soft nontender, nondistended, normoactive bowel sounds. LBM 4/11 ?Neuro: CN II through XII grossly intact, ambulates independently sensation intact.   ?Psych: Alert and oriented.  Pleasant affect.  Reporting visual abnormality/hallucinations that he describes as being like watching a TV set ?  ?  ?  ?Data Reviewed: ?  ?Results reviewed ?  ?Family Communication:  ?Patient ?  ?Disposition: ?Remains inpatient appropriate because:  ?Unsafe DC plan ?  ?Planned Discharge Destination:  ?Barriers to discharge: APS to determine if they will pursue guardianship. Family refusing to take pt because stating they cannot manage him noting refuses to take meds ?  ?Medically stable ?Yes ?  ?COVID vaccination status:  ?[employer] 07/20/2020 ?   ?Consultants: ?Psychiatry ?Neurology ?Procedures: ?EEG ?Echocardiogram ?Antibiotics: ?None ?  ?  ?  ?  ?Time spent: 10 minutes ?  ?Author: ?Junious Silk, NP ?12/02/2021 12:55 PM ?

## 2021-12-17 DIAGNOSIS — E871 Hypo-osmolality and hyponatremia: Secondary | ICD-10-CM | POA: Diagnosis not present

## 2021-12-17 NOTE — Progress Notes (Addendum)
?                                  PROGRESS NOTE                                             ?                                                                                                                     ?                                         ? ? Patient Demographics:  ? ? Jim Dunn, is a 56 y.o. male, DOB - June 23, 1966, ZHG:[id number] ? ?Outpatient Primary MD for the patient is Pcp, No    LOS - 79  Admit date - 09/24/2021   ? ?Chief Complaint  ?Patient presents with  ? Altered Mental Status  ?  Abnormal labs  ?    ? ?Brief Narrative (HPI from H&P)   56 y.o. male with PMH significant for schizophrenia, chronic hyponatremia, seizure disorder, tobacco use, systolic congestive heart failure, COPD who was brought to ED on 09/24/2021 by his family after they were unable to take care of him.  He was found to be hyponatremic he had symptoms suggestive of severe primary polydipsia.  He also had seizures.  He was seen by psych and neurology.  Currently on fluid restriction, still noncompliant with fluid restriction, he is undergoing legal guardianship and awaiting placement. ? ? Subjective:  ? ?Patient in bed, appears comfortable, denies any headache, no fever, no chest pain or pressure, no shortness of breath , no abdominal pain. No new focal weakness.  ? ? Assessment  & Plan :  ? ? 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  Continue salt tablets, continue Xanax per psych.   ? ?Dementia without behavioral disturbance (HCC)  During initial admission over 1 year ago had formal neuropsychiatric evaluation by Dr. Kieth Brightly who diagnosed patient with dementia likely related to years of schizophrenia and nonadherence to medication regimen. SLUMS evaluation worsened since previous admission noting has declined from 18 to 12.  He lacks capacity to  make decisions With the assistance of APS interim already in ship has been completed since family not interested in taking on this role. Psych following undergoing legal guardianship evaluation and placement evaluation. ? ?Paranoid schizophrenia with hallucinations -  Schizophrenia symptoms and decompensation worsened by recurrent hyponatremia, Continue Seroquel and Invega (dose adjusted 12/16/21) , continue Xanax but increased from 0.25 to 0.5 mg since this will also help with tobacco  cravings and in the past this is helped with his psychogenic polydipsia. ? ?Seizure disorder (HCC) [place] due to hyponatremia.  Continue Depakote-History of nonadherence and valproic acid on 1/22 was 19, MRI revealed arachnoid cyst that is chronic in nature, last breakthrough seizure on 10/30/2021 due to hyponatremia.  Seen by neurology this admission ?  ?Mild asymptomatic thrombocytopenia.  No signs of bleeding or petechiae, likely due to Depakote, also Lyrica dose has been dropped, currently thrombocytopenia is minimal.  Will intermittently monitor.   ? ?Chronic systolic CHF (congestive heart failure) (HCC) EF 30% on last echocardiogram in January 2023.  Continue Coreg, digoxin and Entresto, as needed Lasix.  No acute issue.  Require outpatient cardiology follow-up postdischarge. ?  ?COPD (chronic obstructive pulmonary disease) (HCC) - Compensated, given Pneumovax immunization this admission. ? ?Peripheral neuropathy - Chronic issue and was present during previous admission, likely related to years of substance abuse. Continue low-dose Lyrica since was effective previous ? ?Hyperlipidemia -  Continue lipitor ?  ? ?   ? ?Condition - Fair ? ?Family Communication  :  None present ? ?Code Status :  Full ? ?Consults  :  Psych, Neurology ? ?PUD Prophylaxis :  ? ? Procedures  :    ? ?EEG ? ?TTE ? ?   ? ?Disposition Plan  :   ? ?Status is: Inpatient ? ?DVT Prophylaxis  :   ? ?Place and maintain sequential compression device Start: 11/01/21  1711 ? ?Lab Results  ?Component Value Date  ? PLT 168 12/14/2021  ? ? ?Diet :  ?Diet Order   ? ?       ?  Diet regular Room service appropriate? No; Fluid consistency: Thin; Fluid restriction: 1200 mL Fluid  Diet effective 1000       ?  ? ?  ?  ? ?  ?  ? ?Inpatient Medications ? ?Scheduled Meds: ? ALPRAZolam  0.5 mg Oral BID  ? atorvastatin  40 mg Oral Daily  ? carvedilol  12.5 mg Oral BID WC  ? digoxin  0.25 mg Oral Daily  ? divalproex  500 mg Oral BID  ? melatonin  3 mg Oral QHS  ? mirtazapine  15 mg Oral QHS  ? nicotine  14 mg Transdermal Daily  ? paliperidone  6 mg Oral Daily  ? pregabalin  25 mg Oral QHS  ? QUEtiapine  50 mg Oral QHS  ? sacubitril-valsartan  1 tablet Oral BID  ? senna-docusate  2 tablet Oral BID  ? sodium chloride  2 g Oral BID WC  ? ?Continuous Infusions: ?PRN Meds:.acetaminophen **OR** acetaminophen, haloperidol lactate, hydrALAZINE, ipratropium-albuterol, LORazepam, polyethylene glycol ? ?Antibiotics  :   ? ?Anti-infectives (From admission, onward)  ? ? None  ? ?  ? ? ? Time Spent in minutes  30 ? ? ?Susa Raring M.D on 12/17/2021 at 8:59 AM ? ?To page go to [email or web address]  ? ?[facility] -  Office  [phone] ? ?See all Orders from today for further details ? ? ? Objective:  ? ?Vitals:  ? 12/16/21 1952 12/16/21 2309 12/17/21 0314 12/17/21 0811  ?BP: (!) 163/95 102/61 107/67 119/80  ?Pulse: 88 83 73 75  ?Resp: 17 17 17 17   ?Temp: 98.8 ?F (37.1 ?C) 97.6 ?F (36.4 ?C) 97.8 ?F (36.6 ?C) 98.2 ?F (36.8 ?C)  ?TempSrc: Oral Oral  Oral  ?SpO2: 94%   98%  ?Weight:      ?Height:      ? ? ?Wt Readings from Last  3 Encounters:  ?12/15/21 113 kg  ?07/27/21 95.3 kg  ?07/22/20 98.2 kg  ? ? ? ?Intake/Output Summary (Last 24 hours) at 12/17/2021 0859 ?Last data filed at 12/16/2021 1100 ?Gross per 24 hour  ?Intake 240 ml  ?Output --  ?Net 240 ml  ? ? ? ?Physical Exam ? ?Awake Alert, No new F.N deficits, Normal affect ?[place].AT,PERRAL ?Supple Neck, No JVD,   ?Symmetrical Chest wall movement, Good air  movement bilaterally, CTAB ?RRR,No Gallops, Rubs or new Murmurs,  ?+ve B.Sounds, Abd Soft, No tenderness,   ?No Cyanosis, Clubbing or edema  ? ? ? Data Review:  ? ? ?CBC ?Recent Labs  ?Lab 12/14/21 ?0441  ?WBC 7.4  ?HGB 12.2*  ?HCT 37.1*  ?PLT 168  ?MCV 69.2*  ?MCH 22.8*  ?MCHC 32.9  ?RDW 16.6*  ?LYMPHSABS 3.7  ?MONOABS 1.0  ?EOSABS 0.2  ?BASOSABS 0.0  ? ? ?Electrolytes ?Recent Labs  ?Lab 12/14/21 ?0441  ?NA 133*  ?K 4.1  ?CL 102  ?CO2 23  ?GLUCOSE 97  ?BUN 11  ?CREATININE 0.58*  ?CALCIUM 8.9  ? ? ? ?Micro Results ?No results found for this or any previous visit (from the past 240 hour(s)). ? ?Radiology Reports ?No results found.  ? ? ?

## 2021-12-18 DIAGNOSIS — E871 Hypo-osmolality and hyponatremia: Secondary | ICD-10-CM | POA: Diagnosis not present

## 2021-12-18 NOTE — Progress Notes (Signed)
?                                  PROGRESS NOTE                                             ?                                                                                                                     ?                                         ? ? Patient Demographics:  ? ? Jim Dunn, is a 56 y.o. male, DOB - 06/01/1966, [id number] ? ?Outpatient Primary MD for the patient is Pcp, No    LOS - 38  Admit date - 09/24/2021   ? ?Chief Complaint  ?Patient presents with  ? Altered Mental Status  ?  Abnormal labs  ?    ? ?Brief Narrative (HPI from H&P)   56 y.o. male with PMH significant for schizophrenia, chronic hyponatremia, seizure disorder, tobacco use, systolic congestive heart failure, COPD who was brought to ED on 09/24/2021 by his family after they were unable to take care of him.  He was found to be hyponatremic he had symptoms suggestive of severe primary polydipsia.  He also had seizures.  He was seen by psych and neurology.  Currently on fluid restriction, still noncompliant with fluid restriction, he is undergoing legal guardianship and awaiting placement. ? ? Subjective:  ? ?Patient in bed, appears comfortable, denies any headache, no fever, no chest pain or pressure, no shortness of breath , no abdominal pain. No new focal weakness. ? ? Assessment  & Plan :  ? ? 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  Continue salt tablets, continue Xanax per psych.   ? ?Dementia without behavioral disturbance ([place])  During initial admission over 1 year ago had formal neuropsychiatric evaluation by Dr. Sima Matas who diagnosed patient with dementia likely related to years of schizophrenia and nonadherence to medication regimen. SLUMS evaluation worsened since previous admission noting has declined from 18 to 12.  He lacks capacity to make  decisions With the assistance of APS interim already in ship has been completed since family not interested in taking on this role. Psych following undergoing legal guardianship evaluation and placement evaluation. ? ?Paranoid schizophrenia with hallucinations -  Schizophrenia symptoms and decompensation worsened by recurrent hyponatremia, Continue Seroquel and Invega (dose adjusted 12/16/21) , continue Xanax but increased from 0.25 to 0.5 mg since this will also help with tobacco cravings  and in the past this is helped with his psychogenic polydipsia. ? ?Seizure disorder (HCC) [place] due to hyponatremia.  Continue Depakote-History of nonadherence and valproic acid on 1/22 was 19, MRI revealed arachnoid cyst that is chronic in nature, last breakthrough seizure on 10/30/2021 due to hyponatremia.  Seen by neurology this admission ?  ?Mild asymptomatic thrombocytopenia.  No signs of bleeding or petechiae, likely due to Depakote, also Lyrica dose has been dropped, currently thrombocytopenia is minimal.  Will intermittently monitor.   ? ?Chronic systolic CHF (congestive heart failure) (HCC) EF 30% on last echocardiogram in January 2023.  Continue Coreg, digoxin and Entresto, as needed Lasix.  No acute issue.  Require outpatient cardiology follow-up postdischarge. ?  ?COPD (chronic obstructive pulmonary disease) (HCC) - Compensated, given Pneumovax immunization this admission. ? ?Peripheral neuropathy - Chronic issue and was present during previous admission, likely related to years of substance abuse. Continue low-dose Lyrica since was effective previous ? ?Hyperlipidemia -  Continue lipitor ?  ? ?   ? ?Condition - Fair ? ?Family Communication  :  None present ? ?Code Status :  Full ? ?Consults  :  Psych, Neurology ? ?PUD Prophylaxis :  ? ? Procedures  :    ? ?EEG ? ?TTE ? ?   ? ?Disposition Plan  :   ? ?Status is: Inpatient ? ?DVT Prophylaxis  :   ? ?Place and maintain sequential compression device Start: 11/01/21  1711 ? ?Lab Results  ?Component Value Date  ? PLT 168 12/14/2021  ? ? ?Diet :  ?Diet Order   ? ?       ?  Diet regular Room service appropriate? No; Fluid consistency: Thin; Fluid restriction: 1200 mL Fluid  Diet effective 1000       ?  ? ?  ?  ? ?  ?  ? ?Inpatient Medications ? ?Scheduled Meds: ? ALPRAZolam  0.5 mg Oral BID  ? atorvastatin  40 mg Oral Daily  ? carvedilol  12.5 mg Oral BID WC  ? digoxin  0.25 mg Oral Daily  ? divalproex  500 mg Oral BID  ? melatonin  3 mg Oral QHS  ? mirtazapine  15 mg Oral QHS  ? nicotine  14 mg Transdermal Daily  ? paliperidone  6 mg Oral Daily  ? pregabalin  25 mg Oral QHS  ? QUEtiapine  50 mg Oral QHS  ? sacubitril-valsartan  1 tablet Oral BID  ? senna-docusate  2 tablet Oral BID  ? sodium chloride  2 g Oral BID WC  ? ?Continuous Infusions: ?PRN Meds:.acetaminophen **OR** acetaminophen, haloperidol lactate, hydrALAZINE, ipratropium-albuterol, LORazepam, polyethylene glycol ? ?Antibiotics  :   ? ?Anti-infectives (From admission, onward)  ? ? None  ? ?  ? ? ? Time Spent in minutes  30 ? ? ?Susa Raring M.D on 12/18/2021 at 9:30 AM ? ?To page go to [email or web address]  ? ?[facility] -  Office  [phone] ? ?See all Orders from today for further details ? ? ? Objective:  ? ?Vitals:  ? 12/17/21 1937 12/17/21 2340 12/18/21 0339 12/18/21 0500  ?BP: (!) 161/89 94/61 (!) 117/92   ?Pulse: 76 79 76   ?Resp: 16 16 18    ?Temp: 98.3 ?F (36.8 ?C) 98.1 ?F (36.7 ?C) 98 ?F (36.7 ?C)   ?TempSrc: Oral Oral Oral   ?SpO2: 96% 97% 98%   ?Weight:    115 kg  ?Height:      ? ? ?Wt Readings from Last 3 Encounters:  ?  12/18/21 115 kg  ?07/27/21 95.3 kg  ?07/22/20 98.2 kg  ? ? ? ?Intake/Output Summary (Last 24 hours) at 12/18/2021 0930 ?Last data filed at 12/17/2021 2112 ?Gross per 24 hour  ?Intake 480 ml  ?Output --  ?Net 480 ml  ? ? ? ?Physical Exam ? ?Awake Alert, No new F.N deficits, Normal affect ?[place].AT,PERRAL ?Supple Neck, No JVD,   ?Symmetrical Chest wall movement, Good air movement bilaterally,  CTAB ?RRR,No Gallops, Rubs or new Murmurs,  ?+ve B.Sounds, Abd Soft, No tenderness,   ?No Cyanosis, Clubbing or edema  ? ? Data Review:  ? ? ?CBC ?Recent Labs  ?Lab 12/14/21 ?0441  ?WBC 7.4  ?HGB 12.2*  ?HCT 37.1*  ?PLT 168  ?MCV 69.2*  ?MCH 22.8*  ?MCHC 32.9  ?RDW 16.6*  ?LYMPHSABS 3.7  ?MONOABS 1.0  ?EOSABS 0.2  ?BASOSABS 0.0  ? ? ?Electrolytes ?Recent Labs  ?Lab 12/14/21 ?0441  ?NA 133*  ?K 4.1  ?CL 102  ?CO2 23  ?GLUCOSE 97  ?BUN 11  ?CREATININE 0.58*  ?CALCIUM 8.9  ? ? ? ?Micro Results ?No results found for this or any previous visit (from the past 240 hour(s)). ? ?Radiology Reports ?No results found.  ? ? ?

## 2021-12-19 DIAGNOSIS — E871 Hypo-osmolality and hyponatremia: Secondary | ICD-10-CM | POA: Diagnosis not present

## 2021-12-19 LAB — COMPREHENSIVE METABOLIC PANEL
ALT: 13 U/L (ref 0–44)
AST: 16 U/L (ref 15–41)
Albumin: 3.5 g/dL (ref 3.5–5.0)
Alkaline Phosphatase: 53 U/L (ref 38–126)
Anion gap: 6 (ref 5–15)
BUN: 11 mg/dL (ref 6–20)
CO2: 25 mmol/L (ref 22–32)
Calcium: 8.9 mg/dL (ref 8.9–10.3)
Chloride: 100 mmol/L (ref 98–111)
Creatinine, Ser: 0.66 mg/dL (ref 0.61–1.24)
GFR, Estimated: >60 mL/min (ref >60)
Glucose, Bld: 98 mg/dL (ref 70–99)
Potassium: 4.1 mmol/L (ref 3.5–5.1)
Sodium: 131 mmol/L — ABNORMAL LOW (ref 135–145)
Total Bilirubin: 0.7 mg/dL (ref 0.3–1.2)
Total Protein: 6 g/dL — ABNORMAL LOW (ref 6.5–8.1)

## 2021-12-19 LAB — CBC
HCT: 36.2 % — ABNORMAL LOW (ref 39.0–52.0)
Hemoglobin: 11.9 g/dL — ABNORMAL LOW (ref 13.0–17.0)
MCH: 22.8 pg — ABNORMAL LOW (ref 26.0–34.0)
MCHC: 32.9 g/dL (ref 30.0–36.0)
MCV: 69.2 fL — ABNORMAL LOW (ref 80.0–100.0)
Platelets: 166 10*3/uL (ref 150–400)
RBC: 5.23 MIL/uL (ref 4.22–5.81)
RDW: 16.2 % — ABNORMAL HIGH (ref 11.5–15.5)
WBC: 6.7 10*3/uL (ref 4.0–10.5)
nRBC: 0 % (ref 0.0–0.2)

## 2021-12-19 LAB — MAGNESIUM: Magnesium: 1.9 mg/dL (ref 1.7–2.4)

## 2021-12-19 NOTE — Progress Notes (Signed)
?Progress Note ?  ?  ?Patient: Jim Dunn ZOX:[id number] DOB: Feb 16, 1966 DOA: 09/24/2021     56 ?DOS: the patient was seen and examined on 12/02/2021 ?  ?Brief hospital course: ?Vontae Court is a 56 y.o. male with PMH significant for schizophrenia, chronic hyponatremia, seizure disorder, tobacco use, systolic congestive heart failure, COPD who was brought to ED on 09/24/2021 by his family after they were unable to take care of him.  He was drinking a lot of water and trying to burn things. Previously he was staying at group home and was kicked out.   ?Neurology and psychiatry were consulted.   ?Hospital course remarkable for acute on chronic hyponatremia, breakthrough seizures. ?Prolonged hospitalization.   ?TOC following.  ?APS in process to determine if they will pursue guardianship ?  ?  ?Patient's behaviors have been stable.  Process was put in place to allow patient to briefly leave the floor given long length of stay.  Criteria were as follows: Must notify the nurse when he was leaving the floor, would not be allowed to leave the floor for longer than 30 minutes and he was not allowed to leave hospital grounds.  If any 1 of these criteria were broken off unit privileges would be revoked.  It is noted that eventual discharge plan would be to a group home although 24/7 monitoring of fluid intake would be difficult in that setting. ?  ?On the evening of 2/26 patient developed a grand mal seizure and it was noted his sodium had decreased significantly to 117.  It was felt that patient had been drinking fluids in his room and off the unit leading to hypervolemic hyponatremia.  He has a history of polydipsia and has had seizures related to this in the past.  He was placed on hypertonic saline and transferred to the ICU service.  Of note patient had been on Lasix, total fluid restriction versus only free water restriction and salt tablets.  As of now Lasix has been discontinued.  Fluid restriction pertains to only free  water and prior to ICU team assuming care orders changed to allow pt to consume Gatorade and other electrolyte-based fluids to help with excessive thirst. ?  ?  ?  ?  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  Current sodium stable at 132-repeat electrolyte panel a.m. 4/12 sodium remained stable at 33 ?Continue to hold scheduled Lasix. ?  ?@@Please  do not dc his xanax-this drug has been extremely helpful in the management of his psychogenic polydipsia both during this admission and during the previuos admission when I cared for him@@ ?  ?Thrombocytopenia ?Platelets have been stable throughout the hospitalization but over the past few days have slowly dropped with current reading 135,000 ?Lyrica decreased and platelets have normalized to 168,000 ? ?Seizure disorder ?Continue Depakote-History of nonadherence in the outpatient setting.  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  ?Elevated CK ?Resolved ?  ?Paranoid schizophrenia with hallucinations ?Schizophrenia decompensation worsened by recurrent hyponatremia at time of admission ?Continue Seroquel and Invega  ?Continue Xanax for psychogenic polydipsia ?Off unit privileges  accompanied by staff only ?  ?  ?Chronic systolic CHF (congestive heart failure)  ?Continue digoxin and Entresto  ?Lasix discontinued in context of recurrent hyponatremia.  Previously utilized prn for weight gain ?Digoxin level subtherapeutic as of 2/10 but this also appears to be a chronic issue.   ?Strict I/O and daily weights ?No  consistent outpatient follow-up with cardiology.  ?Echocardiogram 09/26/2021 EF of 30-35% with moderately decreased systolic function and global hypokinesis. RV size and systolic function is normal. ?  ?Dementia without behavioral disturbance  ?During initial admission over 1 year ago had formal neuropsychiatric evaluation by Dr. Kieth Brightly who diagnosed patient with dementia 2/2 schizophrenia and nonadherence to medication regimen ?SLUMS evaluation worsened since previous admission noting has declined from 18 to 12.  He lacks capacity to make decisions ?Hospital legal team assisting with guardianship process.  Hearing planned for 01/25/2022 ?  ?Peripheral neuropathy ?Chronic issue and was present during previous admission ?Likely related to years of substance abuse ?Given hallucinatory symptoms will decrease Lyrica from twice daily to at bedtime only ? ?A physical therapy consult is indicated based on the patient?s mobility assessment. ?  ?  ?Mobility Assessment (last 72 hours)   ?  ?  Mobility Assessment   ?  ?  Row Name 11/01/21 0730 10/31/21 0730 10/30/21 1128 10/29/21 2100 10/29/21 0808  ?  Does patient have an order for bedrest or is patient medically unstable No - Continue assessment No - Continue assessment No - Continue assessment No - Continue assessment No - Continue assessment  ?  What is the highest level of mobility based on the progressive mobility assessment? Level 6 (Walks independently in room and hall) - Balance while walking in room without assist - Complete Level 6 (Walks independently in room and hall) - Balance while walking in room without assist - Complete Level 6 (Walks independently in room and hall) - Balance while walking in room without assist - Complete Level 6 (Walks independently in room and hall) - Balance while walking in room without assist - Complete Level 5 (Walks with assist in room/hall) - Balance while stepping forward/back and can walk in room with assist - Complete  ?  ?   ?   ?  ?   ?  ?Mixed hyperlipidemia ?Continue lipitor ?Apparently has been nonadherent with medicines prior to admission ?  ?COPD (chronic obstructive pulmonary disease) (HCC) ?Compensated ?Given Pneumovax immunization this admission ? ? ?Subjective:  ?No further issues with feeling like he is seeing activities occur inside a TV set that he is also inside a ?  ?Physical Exam: ?      ?Vitals:  ?  12/01/21 2304 12/02/21 0434 12/02/21 0905 12/02/21 1138  ?BP: (!) 145/87 106/74 (!) 166/88 (!) 156/88  ?Pulse: 79 68 68 65  ?Resp: 19 19 14 20   ?Temp: 98.4 ?F (36.9 ?C) 98.1 ?F (36.7 ?C) 98.2 ?F (36.8 ?C) 97.6 ?F (36.4 ?C)  ?TempSrc: Oral Oral Oral Oral  ?SpO2: 97% 98% 100% 99%  ?Weight:          ?Height:          ?  ?Gen: Remains calm and cooperative.  No acute distress ?Pulm: Bilateral lung sounds are clear to auscultation, stable on room air, normal respiratory effort ?Cardiac: S1-S2, regular pulse, no peripheral edema, normotensive ?Abd: Soft nontender, nondistended, normoactive bowel sounds. LBM 4/11 ?Neuro: CN II through XII grossly intact, ambulates independently sensation intact.   ?Psych: Alert and oriented.  Pleasant affect.  No  further visual aberrations/hallucination ?  ?  ?  ?Data Reviewed: ?  ?Results reviewed ?  ?Family Communication:  ?Patient ?  ?Disposition: ?Remains inpatient appropriate because:  ?Unsafe DC plan ?  ?Planned Discharge Destination:  ?Barriers to discharge: APS to determine if they will pursue guardianship. Family refusing to take pt because stating they cannot manage him noting refuses to take meds ?  ?Medically stable ?Yes ?  ?COVID vaccination status:  ?Pfizer 07/20/2020 ?  ?Consultants: ?Psychiatry ?Neurology ?Procedures: ?EEG ?Echocardiogram ?Antibiotics: ?None ?  ?  ?  ?  ?Time spent: 10 minutes ?  ?Author: ?Junious Silk, NP ?12/02/2021 12:55 PM ?

## 2021-12-19 NOTE — Progress Notes (Signed)
?Progress Note ?  ?  ?Patient: Jim Dunn EVO:[id number] DOB: Dec 22, 1965 DOA: 09/24/2021     56 ?DOS: the patient was seen and examined on 12/02/2021 ?  ?Brief hospital course: ?Fotios Amos is a 56 y.o. male with PMH significant for schizophrenia, chronic hyponatremia, seizure disorder, tobacco use, systolic congestive heart failure, COPD who was brought to ED on 09/24/2021 by his family after they were unable to take care of him.  He was drinking a lot of water and trying to burn things. Previously he was staying at group home and was kicked out.   ?Neurology and psychiatry were consulted.   ?Hospital course remarkable for acute on chronic hyponatremia, breakthrough seizures. ?Prolonged hospitalization.   ?TOC following.  ?APS in process to determine if they will pursue guardianship ?  ?  ?Patient's behaviors have been stable.  Process was put in place to allow patient to briefly leave the floor given long length of stay.  Criteria were as follows: Must notify the nurse when he was leaving the floor, would not be allowed to leave the floor for longer than 30 minutes and he was not allowed to leave hospital grounds.  If any 1 of these criteria were broken off unit privileges would be revoked.  It is noted that eventual discharge plan would be to a group home although 24/7 monitoring of fluid intake would be difficult in that setting. ?  ?On the evening of 2/26 patient developed a grand mal seizure and it was noted his sodium had decreased significantly to 117.  It was felt that patient had been drinking fluids in his room and off the unit leading to hypervolemic hyponatremia.  He has a history of polydipsia and has had seizures related to this in the past.  He was placed on hypertonic saline and transferred to the ICU service.  Of note patient had been on Lasix, total fluid restriction versus only free water restriction and salt tablets.  As of now Lasix has been discontinued.  Fluid restriction pertains to only free  water and prior to ICU team assuming care orders changed to allow pt to consume Gatorade and other electrolyte-based fluids to help with excessive thirst. ?  ?  ?  ?  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  Current sodium stable at 132-repeat electrolyte panel a.m. 4/12 sodium remained stable at 33 ?Continue to hold scheduled Lasix. ?  ?@@Please  do not dc his xanax-this drug has been extremely helpful in the management of his psychogenic polydipsia both during this admission and during the previuos admission when I cared for him@@ ?  ?Thrombocytopenia ?Platelets have been stable throughout the hospitalization but over the past few days have slowly dropped with current reading 135,000 ?Lyrica decreased and platelets have normalized to 168,000 ? ?Seizure disorder ?Continue Depakote-History of nonadherence in the outpatient setting.  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  ?Elevated CK ?Resolved ?  ?Paranoid schizophrenia with hallucinations ?Schizophrenia decompensation worsened by recurrent hyponatremia at time of admission ?Continue Seroquel and Invega-given apparent new onset of some hallucination activity will increase  Invega from 3 mg to 6 mg and will decrease his Lyrica from twice daily to at bedtime only ?Continue Xanax for psychogenic polydipsia ?Off unit privileges accompanied by staff only ?  ?  ?Chronic systolic CHF (congestive heart failure)  ?Continue digoxin and Entresto  ?Lasix discontinued in context of recurrent hyponatremia.   Previously utilized prn for weight gain ?Digoxin level subtherapeutic as of 2/10 but this also appears to be a chronic issue.   ?Strict I/O and daily weights ?No consistent outpatient follow-up with cardiology.  ?Echocardiogram 09/26/2021 EF of 30-35% with moderately decreased systolic function and global hypokinesis. RV size and systolic function is normal. ?  ?Dementia without behavioral disturbance  ?During initial admission over 1 year ago had formal neuropsychiatric evaluation by Dr. Kieth Brightly who diagnosed patient with dementia 2/2 schizophrenia and nonadherence to medication regimen ?SLUMS evaluation worsened since previous admission noting has declined from 18 to 12.  He lacks capacity to make decisions ?Hospital legal team assisting with guardianship process.  Hearing planned for 01/25/2022 ?  ?Peripheral neuropathy ?Chronic issue and was present during previous admission ?Likely related to years of substance abuse ?Given hallucinatory symptoms will decrease Lyrica from twice daily to at bedtime only ? ?A physical therapy consult is indicated based on the patient?s mobility assessment. ?  ?  ?Mobility Assessment (last 72 hours)   ?  ?  Mobility Assessment   ?  ?  Row Name 11/01/21 0730 10/31/21 0730 10/30/21 1128 10/29/21 2100 10/29/21 0808  ?  Does patient have an order for bedrest or is patient medically unstable No - Continue assessment No - Continue assessment No - Continue assessment No - Continue assessment No - Continue assessment  ?  What is the highest level of mobility based on the progressive mobility assessment? Level 6 (Walks independently in room and hall) - Balance while walking in room without assist - Complete Level 6 (Walks independently in room and hall) - Balance while walking in room without assist - Complete Level 6 (Walks independently in room and hall) - Balance while walking in room without assist - Complete Level 6 (Walks independently in room and hall) - Balance while walking in  room without assist - Complete Level 5 (Walks with assist in room/hall) - Balance while stepping forward/back and can walk in room with assist - Complete  ?  ?   ?  ?  ?   ?  ?Mixed hyperlipidemia ?Continue lipitor ?Apparently has been nonadherent with medicines prior to admission ?  ?COPD (chronic obstructive pulmonary disease) (HCC) ?Compensated ?Given Pneumovax immunization this admission ? ? ?Subjective:  ? ?  ?Physical Exam: ?      ?Vitals:  ?  12/01/21 2304 12/02/21 0434 12/02/21 0905 12/02/21 1138  ?BP: (!) 145/87 106/74 (!) 166/88 (!) 156/88  ?Pulse: 79 68 68 65  ?Resp: 19 19 14 20   ?Temp: 98.4 ?F (36.9 ?C) 98.1 ?F (36.7 ?C) 98.2 ?F (36.8 ?C) 97.6 ?F (36.4 ?C)  ?TempSrc: Oral Oral Oral Oral  ?SpO2: 97% 98% 100% 99%  ?Weight:          ?Height:          ?  ?Gen: Remains calm and cooperative.  No acute distress ?Pulm: Bilateral lung sounds are clear to auscultation, stable on room air, normal respiratory effort ?Cardiac: S1-S2, regular pulse, no peripheral edema, normotensive ?Abd: Soft nontender, nondistended, normoactive bowel sounds. LBM 4/11 ?Neuro: CN II through XII grossly intact, ambulates independently sensation intact.   ?Psych: Alert  and oriented.  Pleasant affect.  Reporting visual abnormality/hallucinations that he describes as being like watching a TV set ?  ?  ?  ?Data Reviewed: ?  ?Results reviewed ?  ?Family Communication:  ?Patient ?  ?Disposition: ?Remains inpatient appropriate because:  ?Unsafe DC plan ?  ?Planned Discharge Destination:  ?Barriers to discharge: APS to determine if they will pursue guardianship. Family refusing to take pt because stating they cannot manage him noting refuses to take meds ?  ?Medically stable ?Yes ?  ?COVID vaccination status:  ?Pfizer 07/20/2020 ?  ?Consultants: ?Psychiatry ?Neurology ?Procedures: ?EEG ?Echocardiogram ?Antibiotics: ?None ?  ?  ?  ?  ?Time spent: 10 minutes ?  ?Author: ?Junious Silk, NP ?12/02/2021 12:55 PM ?

## 2021-12-20 DIAGNOSIS — E871 Hypo-osmolality and hyponatremia: Secondary | ICD-10-CM | POA: Diagnosis not present

## 2021-12-20 MED ORDER — ALPRAZOLAM 0.25 MG PO TABS
0.2500 mg | ORAL_TABLET | Freq: Once | ORAL | Status: AC
  Administered 2021-12-20: 0.25 mg via ORAL
  Filled 2021-12-20: qty 1

## 2021-12-20 MED ORDER — HYDRALAZINE HCL 50 MG PO TABS
50.0000 mg | ORAL_TABLET | Freq: Four times a day (QID) | ORAL | Status: DC | PRN
  Administered 2021-12-20 – 2022-01-06 (×5): 50 mg via ORAL
  Filled 2021-12-20 (×5): qty 1

## 2021-12-20 NOTE — Progress Notes (Signed)
CSW was notified by [employer] team that the documents were undeliverable to the family's address. ? ?CSW attempted to reach patient's sister without success - a voicemail was left requesting a return call. ? ?[name], MSW, LCSW ?Transitions of Care  Clinical Social Worker II ?[phone] ? ?

## 2021-12-20 NOTE — Progress Notes (Signed)
?Progress Note ?  ?  ?Patient: Jim Dunn BFX:[id number] DOB: 1966/04/03 DOA: 09/24/2021     66 ?DOS: the patient was seen and examined on 12/02/2021 ?  ?Brief hospital course: ?Jim Dunn is a 56 y.o. male with PMH significant for schizophrenia, chronic hyponatremia, seizure disorder, tobacco use, systolic congestive heart failure, COPD who was brought to ED on 09/24/2021 by his family after they were unable to take care of him.  He was drinking a lot of water and trying to burn things. Previously he was staying at group home and was kicked out.   ?Neurology and psychiatry were consulted.   ?Hospital course remarkable for acute on chronic hyponatremia, breakthrough seizures. ?Prolonged hospitalization.   ?TOC following.  ?APS in process to determine if they will pursue guardianship ?  ?  ?Patient's behaviors have been stable.  Process was put in place to allow patient to briefly leave the floor given long length of stay.  Criteria were as follows: Must notify the nurse when he was leaving the floor, would not be allowed to leave the floor for longer than 30 minutes and he was not allowed to leave hospital grounds.  If any 1 of these criteria were broken off unit privileges would be revoked.  It is noted that eventual discharge plan would be to a group home although 24/7 monitoring of fluid intake would be difficult in that setting. ?  ?On the evening of 2/26 patient developed a grand mal seizure and it was noted his sodium had decreased significantly to 117.  It was felt that patient had been drinking fluids in his room and off the unit leading to hypervolemic hyponatremia.  He has a history of polydipsia and has had seizures related to this in the past.  He was placed on hypertonic saline and transferred to the ICU service.  Of note patient had been on Lasix, total fluid restriction versus only free water restriction and salt tablets.  As of now Lasix has been discontinued.  Fluid restriction pertains to only free  water and prior to ICU team assuming care orders changed to allow pt to consume Gatorade and other electrolyte-based fluids to help with excessive thirst. ?  ?  ?  ?  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  Current sodium stable at 132-repeat electrolyte panel a.m. 4/17 sodium remained stable at 131 ?Continue to hold scheduled Lasix. ?  ?@@Please  do not dc his xanax-this drug has been extremely helpful in the management of his psychogenic polydipsia both during this admission and during the previuos admission when I cared for him@@ ?  ?Thrombocytopenia ?Platelets have been stable throughout the hospitalization but over the past few days have slowly dropped with current reading 135,000 ?Lyrica decreased and platelets have normalized to 168,000-166,000 ? ?Seizure disorder ?Continue Depakote-History of nonadherence in the outpatient setting.  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  ?Elevated CK ?Resolved ?  ?Paranoid schizophrenia with hallucinations ?Schizophrenia decompensation worsened by recurrent hyponatremia at time of admission ?Continue Seroquel and Invega  ?Continue Xanax for psychogenic polydipsia ?Off unit privileges accompanied by staff only ?  ?  ?Chronic systolic CHF (congestive heart failure)  ?Continue digoxin and Entresto  ?  Lasix discontinued in context of recurrent hyponatremia.  Previously utilized prn for weight gain ?Digoxin level subtherapeutic as of 2/10 but this also appears to be a chronic issue.   ?Strict I/O and daily weights ?No consistent outpatient follow-up with cardiology.  ?Echocardiogram 09/26/2021 EF of 30-35% with moderately decreased  systolic function and global hypokinesis. RV size and systolic function is normal. ?  ?Dementia without behavioral disturbance  ?During initial admission over 1 year ago had formal neuropsychiatric evaluation by Dr. Kieth Brightly who diagnosed patient with dementia 2/2 schizophrenia and nonadherence to medication regimen ?SLUMS evaluation worsened since previous admission noting has declined from 18 to 12.  He lacks capacity to make decisions ?Hospital legal team assisting with guardianship process.  Hearing planned for 01/25/2022 ?  ?Peripheral neuropathy ?Chronic issue and was present during previous admission ?Likely related to years of substance abuse ?Given hallucinatory symptoms will decrease Lyrica from twice daily to at bedtime only ? ?A physical therapy consult is indicated based on the patient?s mobility assessment. ?  ?  ?Mobility Assessment (last 72 hours)   ?  ?  Mobility Assessment   ?  ?  Row Name 11/01/21 0730 10/31/21 0730 10/30/21 1128 10/29/21 2100 10/29/21 0808  ?  Does patient have an order for bedrest or is patient medically unstable No - Continue assessment No - Continue assessment No - Continue assessment No - Continue assessment No - Continue assessment  ?  What is the highest level of mobility based on the progressive mobility assessment? Level 6 (Walks independently in room and hall) - Balance while walking in room without assist - Complete Level 6 (Walks independently in room and hall) - Balance while walking in room without assist - Complete Level 6 (Walks independently in room and hall) - Balance while walking in room without assist - Complete Level 6 (Walks independently in room and hall) - Balance while walking in room without assist - Complete Level 5 (Walks with assist in room/hall) - Balance while stepping forward/back and can walk in room with assist - Complete  ?  ?   ?  ?  ?   ?  ?Mixed hyperlipidemia ?Continue lipitor ?Apparently has been nonadherent with medicines prior to  admission ?  ?COPD (chronic obstructive pulmonary disease) (HCC) ?Compensated ?Given Pneumovax immunization this admission ? ? ?Subjective:  ?Breakfast late and he is sitting on the side of the bed eating breakfast.  No further hallucinatory complaints ?  ?Physical Exam: ?      ?Vitals:  ?  12/01/21 2304 12/02/21 0434 12/02/21 0905 12/02/21 1138  ?BP: (!) 145/87 106/74 (!) 166/88 (!) 156/88  ?Pulse: 79 68 68 65  ?Resp: 19 19 14 20   ?Temp: 98.4 ?F (36.9 ?C) 98.1 ?F (36.7 ?C) 98.2 ?F (36.8 ?C) 97.6 ?F (36.4 ?C)  ?TempSrc: Oral Oral Oral Oral  ?SpO2: 97% 98% 100% 99%  ?Weight:          ?Height:          ?  ?Gen: NAD, calm ?Pulm: RA, Bilateral lung sounds are clear to auscultation, normal respiratory effort ?Cardiac: S1-S2, regular pulse, no peripheral edema, normotensive ?Abd: Soft nontender, nondistended, normoactive bowel sounds. LBM 4/11 ?Neuro: CN II through XII grossly intact, ambulates independently sensation intact.   ?Psych: Alert and oriented.  Pleasant affect.  No further visual aberrations/hallucination ?  ?  ?  ?Data Reviewed: ?  ?Results reviewed ?  ?Family Communication:  ?Patient ?  ?Disposition: ?Remains inpatient appropriate because:  ?Unsafe DC  plan ?  ?Planned Discharge Destination:  ?Barriers to discharge: APS to determine if they will pursue guardianship. Family refusing to take pt because stating they cannot manage him noting refuses to take meds ?  ?Medically stable ?Yes ?  ?COVID vaccination status:  ?Pfizer 07/20/2020 ?  ?Consultants: ?Psychiatry ?Neurology ?Procedures: ?EEG ?Echocardiogram ?Antibiotics: ?None ?  ?  ?  ?  ?Time spent: 10 minutes ?  ?Author: ?Junious Silk, NP ?12/02/2021 12:55 PM ?

## 2021-12-21 DIAGNOSIS — J431 Panlobular emphysema: Secondary | ICD-10-CM | POA: Diagnosis not present

## 2021-12-21 DIAGNOSIS — I5022 Chronic systolic (congestive) heart failure: Secondary | ICD-10-CM | POA: Diagnosis not present

## 2021-12-21 DIAGNOSIS — E871 Hypo-osmolality and hyponatremia: Secondary | ICD-10-CM | POA: Diagnosis not present

## 2021-12-21 MED ORDER — BISACODYL 10 MG RE SUPP
10.0000 mg | Freq: Every day | RECTAL | Status: DC | PRN
  Administered 2021-12-21 – 2022-03-06 (×4): 10 mg via RECTAL
  Filled 2021-12-21 (×4): qty 1

## 2021-12-21 NOTE — Progress Notes (Signed)
CSW spoke with patient at bedside. Patient did not have any concerns or issues to address at this time. Patient states he is ready for discharge but understands barriers. ? ?Patient's guardianship hearing is 01/25/22. ? ?[name], MSW, LCSW ?Transitions of Care  Clinical Social Worker II ?[phone] ? ?

## 2021-12-21 NOTE — Progress Notes (Signed)
?      ?                 PROGRESS NOTE ? ?      ?PATIENT DETAILS ?Name: Jim Dunn ?Age: 56 y.o. ?Sex: male ?Date of Birth: February 24, 1966 ?Admit Date: 09/24/2021 ?Admitting Physician Darlin Drop, DO ?PCP:Pcp, No ? ?Brief Summary: ?56 year old with history of schizophrenia, psychogenic polydipsia, seizure disorder, HFrEF, COPD-brought to the ED by his family as they were unable to take care of him as he was consuming a lot of water and trying to burn things.  Hospital course was complicated by worsening hyponatremia due to excess free water intake (noncompliance to fluid restriction) and breakthrough seizures.  Has been deemed not to have capacity by psychiatry-awaiting guardianship.  Per psychiatry-he is unlikely to recover much more cognitive abilities at this point.  He has been stabilized-and is awaiting placement  ? ?Significant studies: ?1/23>> MRI brain: No acute/reversible finding.  Degree of generalized volume loss.  Chronic right posterior parietal arachnoid cyst 6 cm in diameter. ?1/23>> EEG: No seizures ?1/23>> Echo: EF 30-35%, global hypokinesis ? ?Microbiology data: ?2/27>> blood culture: Negative ?2/27>> urine culture: Negative ?2/27>> COVID/influenza PCR: Negative ?1/21>> COVID/influenza PCR: Negative ? ?Consults: ?PCCM, psychiatry, neurology ? ?Subjective: ?Walking in the hallway-no chest pain or shortness of breath. ? ?Objective: ?Vitals: ?Blood pressure (!) 145/93, pulse 67, temperature 97.8 ?F (36.6 ?C), temperature source Oral, resp. rate 16, height 5\' 8"  (1.727 m), weight 109 kg, SpO2 100 %.  ? ?Exam: ?Gen Exam:not in any distress ?HEENT:atraumatic, normocephalic ?Chest: B/L clear to auscultation anteriorly ?CVS:S1S2 regular ?Abdomen:soft non tender, non distended ?Extremities:no edema ?Neurology: Non focal ?Skin: no rash ? ?Pertinent Labs/Radiology: ? ?  Latest Ref Rng & Units 12/19/2021  ?  1:46 AM 12/14/2021  ?  4:41 AM 12/10/2021  ?  2:22 AM  ?CBC  ?WBC 4.0 - 10.5 K/uL 6.7   7.4   7.6     ?Hemoglobin 13.0 - 17.0 g/dL 86.3   81.7   71.1    ?Hematocrit 39.0 - 52.0 % 36.2   37.1   36.3    ?Platelets 150 - 400 K/uL 166   168   142    ?  ?Lab Results  ?Component Value Date  ? NA 131 (L) 12/19/2021  ? K 4.1 12/19/2021  ? CL 100 12/19/2021  ? CO2 25 12/19/2021  ?  ? ? 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 ? ?Paranoid schizophrenia: Relatively stable-continue Seroquel, Invega and Xanax.  Per prior notes-plan is to continue Xanax as this helps with tobacco cravings and apparently has helped control his symptoms of psychogenic polydipsia in the past. ? ?Dementia: Likely related to years of schizophrenia and nonadherence to medication regimen.  Per psychiatry-does not have capacity-Per prior notes-family not interested in taking on guardianship role.  Awaiting legal guardianship evaluation and placement evaluation. ? ?Seizure disorder: Likely due to hyponatremia-last breakthrough seizure was on 2/26 in the setting of worsening hyponatremia.  Remains on Depakote.  Evaluated by neurology this admission.  EEG was negative. ? ?Chronic HFrEF: Volume status is stable-continue Coreg, digoxin, Entresto and as needed furosemide.  We will need follow-up with cardiology postdischarge. ? ?HLD: Continue Lipitor ? ?COPD: Not in exacerbation-continue bronchodilators. ? ?Peripheral neuropathy: Stable-continue low-dose Lyrica.  Likely related to years of substance abuse. ? ?Obesity: ?Estimated body mass index is 36.54 kg/m? as calculated from  the following: ?  Height as of this encounter: 5\' 8"  (1.727 m). ?  Weight as of this encounter: 109 kg.  ? ?Code status: ?  Code Status: Full Code  ? ?DVT Prophylaxis: ?Place and maintain sequential compression device Start: 11/01/21 1711 ?  ?Family Communication: None at bedside ? ? ?Disposition Plan: ?Status is:  Inpatient ? ?Remains inpatient appropriate because: Medically stable for discharge-Long length of stay patient-awaiting guardianship hearing and placement. ? ? ?Planned Discharge Destination:Group home ? ? ?Diet: ?Diet Order   ? ?       ?  Diet regular Room service appropriate? No; Fluid consistency: Thin; Fluid restriction: 1200 mL Fluid  Diet effective 1000       ?  ? ?  ?  ? ?  ?  ? ? ?Antimicrobial agents: ?Anti-infectives (From admission, onward)  ? ? None  ? ?  ? ? ? ?MEDICATIONS: ?Scheduled Meds: ? ALPRAZolam  0.5 mg Oral BID  ? atorvastatin  40 mg Oral Daily  ? carvedilol  12.5 mg Oral BID WC  ? digoxin  0.25 mg Oral Daily  ? divalproex  500 mg Oral BID  ? melatonin  3 mg Oral QHS  ? mirtazapine  15 mg Oral QHS  ? nicotine  14 mg Transdermal Daily  ? paliperidone  6 mg Oral Daily  ? pregabalin  25 mg Oral QHS  ? QUEtiapine  50 mg Oral QHS  ? sacubitril-valsartan  1 tablet Oral BID  ? senna-docusate  2 tablet Oral BID  ? sodium chloride  2 g Oral BID WC  ? ?Continuous Infusions: ?PRN Meds:.acetaminophen **OR** acetaminophen, haloperidol lactate, hydrALAZINE, ipratropium-albuterol, LORazepam, polyethylene glycol ? ? ?I have personally reviewed following labs and imaging studies ? ?LABORATORY DATA: ?CBC: ?Recent Labs  ?Lab 12/19/21 ?0146  ?WBC 6.7  ?HGB 11.9*  ?HCT 36.2*  ?MCV 69.2*  ?PLT 166  ? ? ?Basic Metabolic Panel: ?Recent Labs  ?Lab 12/19/21 ?0146  ?NA 131*  ?K 4.1  ?CL 100  ?CO2 25  ?GLUCOSE 98  ?BUN 11  ?CREATININE 0.66  ?CALCIUM 8.9  ?MG 1.9  ? ? ?GFR: ?Estimated Creatinine Clearance: 127.8 mL/min (by C-G formula based on SCr of 0.66 mg/dL). ? ?Liver Function Tests: ?Recent Labs  ?Lab 12/19/21 ?0146  ?AST 16  ?ALT 13  ?ALKPHOS 53  ?BILITOT 0.7  ?PROT 6.0*  ?ALBUMIN 3.5  ? ?No results for input(s): LIPASE, AMYLASE in the last 168 hours. ?No results for input(s): AMMONIA in the last 168 hours. ? ?Coagulation Profile: ?No results for input(s): INR, PROTIME in the last 168 hours. ? ?Cardiac Enzymes: ?No  results for input(s): CKTOTAL, CKMB, CKMBINDEX, TROPONINI in the last 168 hours. ? ?BNP (last 3 results) ?No results for input(s): PROBNP in the last 8760 hours. ? ?Lipid Profile: ?No results for input(s): CHOL, HDL, LDLCALC, TRIG, CHOLHDL, LDLDIRECT in the last 72 hours. ? ?Thyroid Function Tests: ?No results for input(s): TSH, T4TOTAL, FREET4, T3FREE, THYROIDAB in the last 72 hours. ? ?Anemia Panel: ?No results for input(s): VITAMINB12, FOLATE, FERRITIN, TIBC, IRON, RETICCTPCT in the last 72 hours. ? ?Urine analysis: ?   ?Component Value Date/Time  ? COLORURINE YELLOW 10/31/2021 1346  ? APPEARANCEUR HAZY (A) 10/31/2021 1346  ? LABSPEC 1.010 10/31/2021 1346  ? PHURINE 6.0 10/31/2021 1346  ? GLUCOSEU NEGATIVE 10/31/2021 1346  ? HGBUR NEGATIVE 10/31/2021 1346  ? BILIRUBINUR NEGATIVE 10/31/2021 1346  ? KETONESUR 5 (A) 10/31/2021 1346  ? PROTEINUR NEGATIVE 10/31/2021 1346  ? NITRITE NEGATIVE  10/31/2021 1346  ? LEUKOCYTESUR NEGATIVE 10/31/2021 1346  ? ? ?Sepsis Labs: ?Lactic Acid, Venous ?   ?Component Value Date/Time  ? LATICACIDVEN 1.6 10/31/2021 2316  ? ? ?MICROBIOLOGY: ?No results found for this or any previous visit (from the past 240 hour(s)). ? ?RADIOLOGY STUDIES/RESULTS: ?No results found. ? ? LOS: 83 days  ? ?Jeoffrey Massed, MD  ?[facility] ? ? ? ?To contact the attending provider between 7A-7P or the covering provider during after hours 7P-7A, please log into the web site [email or web address] and access using universal [place] password for that web site. If you do not have the password, please call the hospital operator. ? ?12/21/2021, 11:17 AM ? ? ? ?

## 2021-12-22 DIAGNOSIS — E871 Hypo-osmolality and hyponatremia: Secondary | ICD-10-CM | POA: Diagnosis not present

## 2021-12-22 NOTE — Progress Notes (Signed)
?      ?                 PROGRESS NOTE ? ?      ?PATIENT DETAILS ?Name: Jim Dunn ?Age: 56 y.o. ?Sex: male ?Date of Birth: 1966-03-07 ?Admit Date: 09/24/2021 ?Admitting Physician Kayleen Memos, DO ?PCP:Pcp, No ? ?Brief Summary: ?56 year old with history of schizophrenia, psychogenic polydipsia, seizure disorder, HFrEF, COPD-brought to the ED by his family as they were unable to take care of him as he was consuming a lot of water and trying to burn things.  Hospital course was complicated by worsening hyponatremia due to excess free water intake (noncompliance to fluid restriction) and breakthrough seizures.  Has been deemed not to have capacity by psychiatry-awaiting guardianship.  Per psychiatry-he is unlikely to recover much more cognitive abilities at this point.  He has been stabilized-and is awaiting placement  ? ?Significant studies: ?1/23>> MRI brain: No acute/reversible finding.  Degree of generalized volume loss.  Chronic right posterior parietal arachnoid cyst 6 cm in diameter. ?1/23>> EEG: No seizures ?1/23>> Echo: EF 30-35%, global hypokinesis ? ?Microbiology data: ?2/27>> blood culture: Negative ?2/27>> urine culture: Negative ?2/27>> COVID/influenza PCR: Negative ?1/21>> COVID/influenza PCR: Negative ? ?Consults: ?PCCM, psychiatry, neurology ? ?Subjective: ?Walking in the hallway-no chest pain or shortness of breath. ? ?Objective: ?Vitals: ?Blood pressure 123/80, pulse 68, temperature 97.8 ?F (36.6 ?C), temperature source Oral, resp. rate 20, height 5\' 8"  (1.727 m), weight 108.7 kg, SpO2 99 %.  ? ?Exam: ?Gen Exam:Alert awake-not in any distress ?HEENT:atraumatic, normocephalic ?Chest: B/L clear to auscultation anteriorly ?CVS:S1S2 regular ?Abdomen:soft non tender, non distended ?Extremities:no edema ?Neurology: Non focal ?Skin: no rash  ? ?Pertinent Labs/Radiology: ? ?  Latest Ref Rng & Units 12/19/2021  ?  1:46 AM 12/14/2021  ?  4:41 AM 12/10/2021  ?  2:22 AM  ?CBC  ?WBC 4.0 - 10.5 K/uL 6.7   7.4   7.6     ?Hemoglobin 13.0 - 17.0 g/dL 11.9   12.2   12.2    ?Hematocrit 39.0 - 52.0 % 36.2   37.1   36.3    ?Platelets 150 - 400 K/uL 166   168   142    ?  ?Lab Results  ?Component Value Date  ? NA 131 (L) 12/19/2021  ? K 4.1 12/19/2021  ? CL 100 12/19/2021  ? CO2 25 12/19/2021  ? ?  ? ? 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 ? ?Paranoid schizophrenia: Relatively stable-continue Seroquel, Invega and Xanax.  Per prior notes-plan is to continue Xanax as this helps with tobacco cravings and apparently has helped control his symptoms of psychogenic polydipsia in the past. ? ?Dementia: Likely related to years of schizophrenia and nonadherence to medication regimen.  Per psychiatry-does not have capacity-Per prior notes-family not interested in taking on guardianship role.  Awaiting legal guardianship evaluation and placement evaluation. ? ?Seizure disorder: Likely due to hyponatremia-last breakthrough seizure was on 2/26 in the setting of worsening hyponatremia.  Remains on Depakote.  Evaluated by neurology this admission.  EEG was negative. ? ?Chronic HFrEF: Volume status is stable-continue Coreg, digoxin, Entresto and as needed furosemide.  We will need follow-up with cardiology postdischarge. ? ?HLD: Continue Lipitor ? ?COPD: Not in exacerbation-continue bronchodilators. ? ?Peripheral neuropathy: Stable-continue low-dose Lyrica.  Likely related to years of substance abuse. ? ?Obesity: ?Estimated body mass index is 36.44 kg/m? as  calculated from the following: ?  Height as of this encounter: 5\' 8"  (1.727 m). ?  Weight as of this encounter: 108.7 kg.  ? ?Code status: ?  Code Status: Full Code  ? ?DVT Prophylaxis: ?Place and maintain sequential compression device Start: 11/01/21 1711 ?  ?Family Communication: None at bedside ? ? ?Disposition Plan: ?Status is:  Inpatient ? ?Remains inpatient appropriate because: Medically stable for discharge-Long length of stay patient-awaiting guardianship hearing and placement. ? ? ?Planned Discharge Destination:Group home ? ? ?Diet: ?Diet Order   ? ?       ?  Diet regular Room service appropriate? No; Fluid consistency: Thin; Fluid restriction: 1200 mL Fluid  Diet effective 1000       ?  ? ?  ?  ? ?  ?  ? ? ?Antimicrobial agents: ?Anti-infectives (From admission, onward)  ? ? None  ? ?  ? ? ? ?MEDICATIONS: ?Scheduled Meds: ? ALPRAZolam  0.5 mg Oral BID  ? atorvastatin  40 mg Oral Daily  ? carvedilol  12.5 mg Oral BID WC  ? digoxin  0.25 mg Oral Daily  ? divalproex  500 mg Oral BID  ? melatonin  3 mg Oral QHS  ? mirtazapine  15 mg Oral QHS  ? nicotine  14 mg Transdermal Daily  ? paliperidone  6 mg Oral Daily  ? pregabalin  25 mg Oral QHS  ? QUEtiapine  50 mg Oral QHS  ? sacubitril-valsartan  1 tablet Oral BID  ? senna-docusate  2 tablet Oral BID  ? sodium chloride  2 g Oral BID WC  ? ?Continuous Infusions: ?PRN Meds:.acetaminophen **OR** acetaminophen, bisacodyl, haloperidol lactate, hydrALAZINE, ipratropium-albuterol, LORazepam, polyethylene glycol ? ? ?I have personally reviewed following labs and imaging studies ? ?LABORATORY DATA: ?CBC: ?Recent Labs  ?Lab 12/19/21 ?0146  ?WBC 6.7  ?HGB 11.9*  ?HCT 36.2*  ?MCV 69.2*  ?PLT 166  ? ? ? ?Basic Metabolic Panel: ?Recent Labs  ?Lab 12/19/21 ?0146  ?NA 131*  ?K 4.1  ?CL 100  ?CO2 25  ?GLUCOSE 98  ?BUN 11  ?CREATININE 0.66  ?CALCIUM 8.9  ?MG 1.9  ? ? ? ?GFR: ?Estimated Creatinine Clearance: 127.6 mL/min (by C-G formula based on SCr of 0.66 mg/dL). ? ?Liver Function Tests: ?Recent Labs  ?Lab 12/19/21 ?0146  ?AST 16  ?ALT 13  ?ALKPHOS 53  ?BILITOT 0.7  ?PROT 6.0*  ?ALBUMIN 3.5  ? ? ?No results for input(s): LIPASE, AMYLASE in the last 168 hours. ?No results for input(s): AMMONIA in the last 168 hours. ? ?Coagulation Profile: ?No results for input(s): INR, PROTIME in the last 168  hours. ? ?Cardiac Enzymes: ?No results for input(s): CKTOTAL, CKMB, CKMBINDEX, TROPONINI in the last 168 hours. ? ?BNP (last 3 results) ?No results for input(s): PROBNP in the last 8760 hours. ? ?Lipid Profile: ?No results for input(s): CHOL, HDL, LDLCALC, TRIG, CHOLHDL, LDLDIRECT in the last 72 hours. ? ?Thyroid Function Tests: ?No results for input(s): TSH, T4TOTAL, FREET4, T3FREE, THYROIDAB in the last 72 hours. ? ?Anemia Panel: ?No results for input(s): VITAMINB12, FOLATE, FERRITIN, TIBC, IRON, RETICCTPCT in the last 72 hours. ? ?Urine analysis: ?   ?Component Value Date/Time  ? COLORURINE YELLOW 10/31/2021 1346  ? APPEARANCEUR HAZY (A) 10/31/2021 1346  ? LABSPEC 1.010 10/31/2021 1346  ? PHURINE 6.0 10/31/2021 1346  ? GLUCOSEU NEGATIVE 10/31/2021 1346  ? [place] NEGATIVE 10/31/2021 1346  ? [place] NEGATIVE 10/31/2021 1346  ? KETONESUR 5 (A) 10/31/2021 1346  ? PROTEINUR NEGATIVE  10/31/2021 1346  ? NITRITE NEGATIVE 10/31/2021 1346  ? LEUKOCYTESUR NEGATIVE 10/31/2021 1346  ? ? ?Sepsis Labs: ?Lactic Acid, Venous ?   ?Component Value Date/Time  ? LATICACIDVEN 1.6 10/31/2021 2316  ? ? ?MICROBIOLOGY: ?No results found for this or any previous visit (from the past 240 hour(s)). ? ?RADIOLOGY STUDIES/RESULTS: ?No results found. ? ? LOS: 84 days  ? ?Oren Binet, MD  ?[facility] ? ? ? ?To contact the attending provider between 7A-7P or the covering provider during after hours 7P-7A, please log into the web site [email or web address] and access using universal [place] password for that web site. If you do not have the password, please call the hospital operator. ? ?12/22/2021, 12:02 PM ? ? ? ?

## 2021-12-23 DIAGNOSIS — E871 Hypo-osmolality and hyponatremia: Secondary | ICD-10-CM | POA: Diagnosis not present

## 2021-12-23 NOTE — Progress Notes (Signed)
?      ?                 PROGRESS NOTE ? ?      ?PATIENT DETAILS ?Name: Jim Dunn ?Age: 56 y.o. ?Sex: male ?Date of Birth: [birth year] ?Admit Date: 09/24/2021 ?Admitting Physician Kayleen Memos, DO ?PCP:Pcp, No ? ?Brief Summary: ?56 year old with history of schizophrenia, psychogenic polydipsia, seizure disorder, HFrEF, COPD-brought to the ED by his family as they were unable to take care of him as he was consuming a lot of water and trying to burn things.  Hospital course was complicated by worsening hyponatremia due to excess free water intake (noncompliance to fluid restriction) and breakthrough seizures.  Has been deemed not to have capacity by psychiatry-awaiting guardianship.  Per psychiatry-he is unlikely to recover much more cognitive abilities at this point.  He has been stabilized-and is awaiting placement  ? ?Significant studies: ?1/23>> MRI brain: No acute/reversible finding.  Degree of generalized volume loss.  Chronic right posterior parietal arachnoid cyst 6 cm in diameter. ?1/23>> EEG: No seizures ?1/23>> Echo: EF 30-35%, global hypokinesis ? ?Microbiology data: ?2/27>> blood culture: Negative ?2/27>> urine culture: Negative ?2/27>> COVID/influenza PCR: Negative ?1/21>> COVID/influenza PCR: Negative ? ?Consults: ?PCCM, psychiatry, neurology ? ?Subjective: ?Lying comfortably in bed earlier this morning. ? ?Objective: ?Vitals: ?Blood pressure 120/71, pulse 89, temperature 98.2 ?F (36.8 ?C), temperature source Oral, resp. rate 18, height 5\' 8"  (1.727 m), weight 110.4 kg, SpO2 95 %.  ? ?Exam: ?Gen Exam:Alert awake-not in any distress ?HEENT:atraumatic, normocephalic ?Chest: B/L clear to auscultation anteriorly ?CVS:S1S2 regular ?Abdomen:soft non tender, non distended ?Extremities:no edema ?Neurology: Non focal ?Skin: no rash  ? ?Pertinent Labs/Radiology: ? ?  Latest Ref Rng & Units 12/19/2021  ?  1:46 AM 12/14/2021  ?  4:41 AM 12/10/2021  ?  2:22 AM  ?CBC  ?WBC 4.0 - 10.5 K/uL 6.7   7.4   7.6    ?Hemoglobin  13.0 - 17.0 g/dL 11.9   12.2   12.2    ?Hematocrit 39.0 - 52.0 % 36.2   37.1   36.3    ?Platelets 150 - 400 K/uL 166   168   142    ?  ?Lab Results  ?Component Value Date  ? NA 131 (L) 12/19/2021  ? K 4.1 12/19/2021  ? CL 100 12/19/2021  ? CO2 25 12/19/2021  ? ?  ? ? 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 ? ?Paranoid schizophrenia: Relatively stable-continue Seroquel, Invega and Xanax.  Per prior notes-plan is to continue Xanax as this helps with tobacco cravings and apparently has helped control his symptoms of psychogenic polydipsia in the past. ? ?Dementia: Likely related to years of schizophrenia and nonadherence to medication regimen.  Per psychiatry-does not have capacity-Per prior notes-family not interested in taking on guardianship role.  Awaiting legal guardianship evaluation and placement evaluation. ? ?Seizure disorder: Likely due to hyponatremia-last breakthrough seizure was on 2/26 in the setting of worsening hyponatremia.  Remains on Depakote.  Evaluated by neurology this admission.  EEG was negative. ? ?Chronic HFrEF: Volume status is stable-continue Coreg, digoxin, Entresto and as needed furosemide.  We will need follow-up with cardiology postdischarge. ? ?HLD: Continue Lipitor ? ?COPD: Not in exacerbation-continue bronchodilators. ? ?Peripheral neuropathy: Stable-continue low-dose Lyrica.  Likely related to years of substance abuse. ? ?Obesity: ?Estimated body mass index is 37.01 kg/m? as calculated from the  following: ?  Height as of this encounter: 5\' 8"  (1.727 m). ?  Weight as of this encounter: 110.4 kg.  ? ?Code status: ?  Code Status: Full Code  ? ?DVT Prophylaxis: ?Place and maintain sequential compression device Start: 11/01/21 1711 ?  ?Family Communication: None at bedside ? ? ?Disposition Plan: ?Status is:  Inpatient ? ?Remains inpatient appropriate because: Medically stable for discharge-Long length of stay patient-awaiting guardianship hearing and placement. ? ? ?Planned Discharge Destination:Group home ? ? ?Diet: ?Diet Order   ? ?       ?  Diet regular Room service appropriate? No; Fluid consistency: Thin; Fluid restriction: 1200 mL Fluid  Diet effective 1000       ?  ? ?  ?  ? ?  ?  ? ? ?Antimicrobial agents: ?Anti-infectives (From admission, onward)  ? ? None  ? ?  ? ? ? ?MEDICATIONS: ?Scheduled Meds: ? ALPRAZolam  0.5 mg Oral BID  ? atorvastatin  40 mg Oral Daily  ? carvedilol  12.5 mg Oral BID WC  ? digoxin  0.25 mg Oral Daily  ? divalproex  500 mg Oral BID  ? melatonin  3 mg Oral QHS  ? mirtazapine  15 mg Oral QHS  ? nicotine  14 mg Transdermal Daily  ? paliperidone  6 mg Oral Daily  ? pregabalin  25 mg Oral QHS  ? QUEtiapine  50 mg Oral QHS  ? sacubitril-valsartan  1 tablet Oral BID  ? senna-docusate  2 tablet Oral BID  ? sodium chloride  2 g Oral BID WC  ? ?Continuous Infusions: ?PRN Meds:.acetaminophen **OR** acetaminophen, bisacodyl, haloperidol lactate, hydrALAZINE, ipratropium-albuterol, LORazepam, polyethylene glycol ? ? ?I have personally reviewed following labs and imaging studies ? ?LABORATORY DATA: ?CBC: ?Recent Labs  ?Lab 12/19/21 ?0146  ?WBC 6.7  ?HGB 11.9*  ?HCT 36.2*  ?MCV 69.2*  ?PLT 166  ? ? ? ?Basic Metabolic Panel: ?Recent Labs  ?Lab 12/19/21 ?0146  ?NA 131*  ?K 4.1  ?CL 100  ?CO2 25  ?GLUCOSE 98  ?BUN 11  ?CREATININE 0.66  ?CALCIUM 8.9  ?MG 1.9  ? ? ? ?GFR: ?Estimated Creatinine Clearance: 128.7 mL/min (by C-G formula based on SCr of 0.66 mg/dL). ? ?Liver Function Tests: ?Recent Labs  ?Lab 12/19/21 ?0146  ?AST 16  ?ALT 13  ?ALKPHOS 53  ?BILITOT 0.7  ?PROT 6.0*  ?ALBUMIN 3.5  ? ? ?No results for input(s): LIPASE, AMYLASE in the last 168 hours. ?No results for input(s): AMMONIA in the last 168 hours. ? ?Coagulation Profile: ?No results for input(s): INR, PROTIME in the last 168  hours. ? ?Cardiac Enzymes: ?No results for input(s): CKTOTAL, CKMB, CKMBINDEX, TROPONINI in the last 168 hours. ? ?BNP (last 3 results) ?No results for input(s): PROBNP in the last 8760 hours. ? ?Lipid Profile: ?No results for input(s): CHOL, HDL, LDLCALC, TRIG, CHOLHDL, LDLDIRECT in the last 72 hours. ? ?Thyroid Function Tests: ?No results for input(s): TSH, T4TOTAL, FREET4, T3FREE, THYROIDAB in the last 72 hours. ? ?Anemia Panel: ?No results for input(s): VITAMINB12, FOLATE, FERRITIN, TIBC, IRON, RETICCTPCT in the last 72 hours. ? ?Urine analysis: ?   ?Component Value Date/Time  ? COLORURINE YELLOW 10/31/2021 1346  ? APPEARANCEUR HAZY (A) 10/31/2021 1346  ? LABSPEC 1.010 10/31/2021 1346  ? PHURINE 6.0 10/31/2021 1346  ? GLUCOSEU NEGATIVE 10/31/2021 1346  ? [place] NEGATIVE 10/31/2021 1346  ? [place] NEGATIVE 10/31/2021 1346  ? KETONESUR 5 (A) 10/31/2021 1346  ? PROTEINUR NEGATIVE 10/31/2021 1346  ?  NITRITE NEGATIVE 10/31/2021 1346  ? LEUKOCYTESUR NEGATIVE 10/31/2021 1346  ? ? ?Sepsis Labs: ?Lactic Acid, Venous ?   ?Component Value Date/Time  ? LATICACIDVEN 1.6 10/31/2021 2316  ? ? ?MICROBIOLOGY: ?No results found for this or any previous visit (from the past 240 hour(s)). ? ?RADIOLOGY STUDIES/RESULTS: ?No results found. ? ? LOS: 85 days  ? ?Oren Binet, MD  ?[facility] ? ? ? ?To contact the attending provider between 7A-7P or the covering provider during after hours 7P-7A, please log into the web site [email or web address] and access using universal [place] password for that web site. If you do not have the password, please call the hospital operator. ? ?12/23/2021, 2:24 PM ? ? ? ?

## 2021-12-24 DIAGNOSIS — E871 Hypo-osmolality and hyponatremia: Secondary | ICD-10-CM | POA: Diagnosis not present

## 2021-12-24 NOTE — Progress Notes (Signed)
?      ?                 PROGRESS NOTE ? ?      ?PATIENT DETAILS ?Name: Jim Dunn ?Age: 56 y.o. ?Sex: male ?Date of Birth: March 03, 1966 ?Admit Date: 09/24/2021 ?Admitting Physician Darlin Drop, DO ?PCP:Pcp, No ? ?Brief Summary: ?56 year old with history of schizophrenia, psychogenic polydipsia, seizure disorder, HFrEF, COPD-brought to the ED by his family as they were unable to take care of him as he was consuming a lot of water and trying to burn things.  Hospital course was complicated by worsening hyponatremia due to excess free water intake (noncompliance to fluid restriction) and breakthrough seizures.  Has been deemed not to have capacity by psychiatry-awaiting guardianship.  Per psychiatry-he is unlikely to recover much more cognitive abilities at this point.  He has been stabilized-and is awaiting placement  ? ?Significant studies: ?1/23>> MRI brain: No acute/reversible finding.  Degree of generalized volume loss.  Chronic right posterior parietal arachnoid cyst 6 cm in diameter. ?1/23>> EEG: No seizures ?1/23>> Echo: EF 30-35%, global hypokinesis ? ?Microbiology data: ?2/27>> blood culture: Negative ?2/27>> urine culture: Negative ?2/27>> COVID/influenza PCR: Negative ?1/21>> COVID/influenza PCR: Negative ? ?Consults: ?PCCM, psychiatry, neurology ? ?Subjective:   Patient in bed, appears comfortable, denies any headache, no fever, no chest pain or pressure, no shortness of breath , no abdominal pain. No new focal weakness. ? ?Objective: ?Vitals: ?Blood pressure (!) 149/90, pulse 66, temperature 98 ?F (36.7 ?C), temperature source Oral, resp. rate 17, height 5\' 8"  (1.727 m), weight 108.3 kg, SpO2 99 %.  ? ?Exam: ? ?Awake Alert, No new F.N deficits,  ?[place].AT,PERRAL ?Supple Neck, No JVD,   ?Symmetrical Chest wall movement, Good air movement bilaterally, CTAB ?RRR,No Gallops, Rubs or new Murmurs,  ?+ve B.Sounds, Abd Soft, No tenderness,   ?No Cyanosis, Clubbing or edema  ? ? ? ?Assessment/Plan: ? 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 ? ?Paranoid schizophrenia: Relatively stable-continue Seroquel, Invega and Xanax.  Per prior notes-plan is to continue Xanax as this helps with tobacco cravings and apparently has helped control his symptoms of psychogenic polydipsia in the past. ? ?Dementia: Likely related to years of schizophrenia and nonadherence to medication regimen.  Per psychiatry-does not have capacity-Per prior notes-family not interested in taking on guardianship role.  Awaiting legal guardianship evaluation and placement evaluation. ? ?Seizure disorder: Likely due to hyponatremia-last breakthrough seizure was on 2/26 in the setting of worsening hyponatremia.  Remains on Depakote.  Evaluated by neurology this admission.  EEG was negative. ? ?Chronic HFrEF: Volume status is stable-continue Coreg, digoxin, Entresto and as needed furosemide.  We will need follow-up with cardiology postdischarge. ? ?HLD: Continue Lipitor ? ?COPD: Not in exacerbation-continue bronchodilators. ? ?Peripheral neuropathy: Stable-continue low-dose Lyrica.  Likely related to years of substance abuse. ? ?Obesity: BMI 36, follow with PCP for weight loss. ?  ? ?Code status: ?  Code Status: Full Code  ? ?DVT Prophylaxis: ?Place and maintain sequential compression device Start: 11/01/21 1711 ?  ?Family Communication: None at bedside ? ? ?Disposition Plan: ?Status is: Inpatient ? ?Remains inpatient appropriate because: Medically stable for discharge-Long length of stay patient-awaiting guardianship hearing and placement. ? ? ?Planned Discharge Destination:Group home ? ? ?Diet: ?Diet Order   ? ?       ?  Diet regular Room service appropriate? No; Fluid consistency: Thin; Fluid  restriction: 1200 mL Fluid  Diet effective 1000       ?  ? ?  ?  ? ?  ?  ? ? ?Antimicrobial agents: ?Anti-infectives (From  admission, onward)  ? ? None  ? ?  ? ? ? ?MEDICATIONS: ?Scheduled Meds: ? ALPRAZolam  0.5 mg Oral BID  ? atorvastatin  40 mg Oral Daily  ? carvedilol  12.5 mg Oral BID WC  ? digoxin  0.25 mg Oral Daily  ? divalproex  500 mg Oral BID  ? melatonin  3 mg Oral QHS  ? mirtazapine  15 mg Oral QHS  ? nicotine  14 mg Transdermal Daily  ? paliperidone  6 mg Oral Daily  ? pregabalin  25 mg Oral QHS  ? QUEtiapine  50 mg Oral QHS  ? sacubitril-valsartan  1 tablet Oral BID  ? senna-docusate  2 tablet Oral BID  ? sodium chloride  2 g Oral BID WC  ? ?Continuous Infusions: ?PRN Meds:.acetaminophen **OR** acetaminophen, bisacodyl, haloperidol lactate, hydrALAZINE, ipratropium-albuterol, LORazepam, polyethylene glycol ? ? ?I have personally reviewed following labs and imaging studies ? ?LABORATORY DATA: ? ? ?Recent Labs  ?Lab 12/19/21 ?0146  ?WBC 6.7  ?HGB 11.9*  ?HCT 36.2*  ?PLT 166  ?MCV 69.2*  ?MCH 22.8*  ?MCHC 32.9  ?RDW 16.2*  ? ? ?Recent Labs  ?Lab 12/19/21 ?0146  ?NA 131*  ?K 4.1  ?CL 100  ?CO2 25  ?GLUCOSE 98  ?BUN 11  ?CREATININE 0.66  ?CALCIUM 8.9  ?AST 16  ?ALT 13  ?ALKPHOS 53  ?BILITOT 0.7  ?ALBUMIN 3.5  ?MG 1.9  ? ? ? LOS: 86 days  ? ?Signature ? ?Susa Raring M.D on 12/24/2021 at 8:51 AM   -  To page go to [email or web address]  ? ?  ? ? ? ? ?

## 2021-12-25 DIAGNOSIS — E871 Hypo-osmolality and hyponatremia: Secondary | ICD-10-CM | POA: Diagnosis not present

## 2021-12-25 NOTE — Progress Notes (Signed)
?      ?                 PROGRESS NOTE ? ?      ?PATIENT DETAILS ?Name: Jim Dunn ?Age: 56 y.o. ?Sex: male ?Date of Birth: Feb 26, 1966 ?Admit Date: 09/24/2021 ?Admitting Physician Darlin Drop, DO ?PCP:Pcp, No ? ?Brief Summary: ?56 year old with history of schizophrenia, psychogenic polydipsia, seizure disorder, HFrEF, COPD-brought to the ED by his family as they were unable to take care of him as he was consuming a lot of water and trying to burn things.  Hospital course was complicated by worsening hyponatremia due to excess free water intake (noncompliance to fluid restriction) and breakthrough seizures.  Has been deemed not to have capacity by psychiatry-awaiting guardianship.  Per psychiatry-he is unlikely to recover much more cognitive abilities at this point.  He has been stabilized-and is awaiting placement  ? ?Significant studies: ?1/23>> MRI brain: No acute/reversible finding.  Degree of generalized volume loss.  Chronic right posterior parietal arachnoid cyst 6 cm in diameter. ?1/23>> EEG: No seizures ?1/23>> Echo: EF 30-35%, global hypokinesis ? ?Microbiology data: ?2/27>> blood culture: Negative ?2/27>> urine culture: Negative ?2/27>> COVID/influenza PCR: Negative ?1/21>> COVID/influenza PCR: Negative ? ?Consults: ?PCCM, psychiatry, neurology ? ? ?Subjective:   Patient in bed, appears comfortable, denies any headache, no fever, no chest pain or pressure, no shortness of breath , no abdominal pain. No new focal weakness. ? ? ?Objective: ?Vitals: ?Blood pressure (!) 177/95, pulse 65, temperature 98.2 ?F (36.8 ?C), temperature source Oral, resp. rate 16, height 5\' 8"  (1.727 m), weight 111.8 kg, SpO2 99 %.  ? ?Exam: ? ?Awake Alert, No new F.N deficits, Normal affect ?[place].AT,PERRAL ?Supple Neck, No JVD,   ?Symmetrical Chest wall movement, Good air movement bilaterally, CTAB ?RRR,No Gallops, Rubs or new Murmurs,  ?+ve B.Sounds, Abd Soft, No tenderness,   ?No Cyanosis, Clubbing or edema   ? ? ?Assessment/Plan: ? 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 ? ?Paranoid schizophrenia: Relatively stable-continue Seroquel, Invega and Xanax.  Per prior notes-plan is to continue Xanax as this helps with tobacco cravings and apparently has helped control his symptoms of psychogenic polydipsia in the past. ? ?Dementia: Likely related to years of schizophrenia and nonadherence to medication regimen.  Per psychiatry-does not have capacity-Per prior notes-family not interested in taking on guardianship role.  Awaiting legal guardianship evaluation and placement evaluation. ? ?Seizure disorder: Likely due to hyponatremia-last breakthrough seizure was on 2/26 in the setting of worsening hyponatremia.  Remains on Depakote.  Evaluated by neurology this admission.  EEG was negative. ? ?Chronic HFrEF: Volume status is stable-continue Coreg, digoxin, Entresto and as needed furosemide.  We will need follow-up with cardiology postdischarge. ? ?HLD: Continue Lipitor ? ?COPD: Not in exacerbation-continue bronchodilators. ? ?Peripheral neuropathy: Stable-continue low-dose Lyrica.  Likely related to years of substance abuse. ? ?Obesity: BMI 36, follow with PCP for weight loss. ?  ? ?Code status: ?  Code Status: Full Code  ? ?DVT Prophylaxis: ?Place and maintain sequential compression device Start: 11/01/21 1711 ?  ?Family Communication: None at bedside ? ? ?Disposition Plan: ?Status is: Inpatient ? ?Remains inpatient appropriate because: Medically stable for discharge-Long length of stay patient-awaiting guardianship hearing and placement. ? ? ?Planned Discharge Destination:Group home ? ? ?Diet: ?Diet Order   ? ?       ?  Diet regular Room service appropriate? No; Fluid consistency:  Thin; Fluid restriction: 1200 mL Fluid  Diet effective 1000       ?  ? ?  ?  ? ?  ?   ? ? ?Antimicrobial agents: ?Anti-infectives (From admission, onward)  ? ? None  ? ?  ? ? ? ?MEDICATIONS: ?Scheduled Meds: ? ALPRAZolam  0.5 mg Oral BID  ? atorvastatin  40 mg Oral Daily  ? carvedilol  12.5 mg Oral BID WC  ? digoxin  0.25 mg Oral Daily  ? divalproex  500 mg Oral BID  ? melatonin  3 mg Oral QHS  ? mirtazapine  15 mg Oral QHS  ? nicotine  14 mg Transdermal Daily  ? paliperidone  6 mg Oral Daily  ? pregabalin  25 mg Oral QHS  ? QUEtiapine  50 mg Oral QHS  ? sacubitril-valsartan  1 tablet Oral BID  ? senna-docusate  2 tablet Oral BID  ? sodium chloride  2 g Oral BID WC  ? ?Continuous Infusions: ?PRN Meds:.acetaminophen **OR** acetaminophen, bisacodyl, haloperidol lactate, hydrALAZINE, ipratropium-albuterol, LORazepam, polyethylene glycol ? ? ?I have personally reviewed following labs and imaging studies ? ?LABORATORY DATA: ? ? ?Recent Labs  ?Lab 12/19/21 ?0146  ?WBC 6.7  ?HGB 11.9*  ?HCT 36.2*  ?PLT 166  ?MCV 69.2*  ?MCH 22.8*  ?MCHC 32.9  ?RDW 16.2*  ? ? ?Recent Labs  ?Lab 12/19/21 ?0146  ?NA 131*  ?K 4.1  ?CL 100  ?CO2 25  ?GLUCOSE 98  ?BUN 11  ?CREATININE 0.66  ?CALCIUM 8.9  ?AST 16  ?ALT 13  ?ALKPHOS 53  ?BILITOT 0.7  ?ALBUMIN 3.5  ?MG 1.9  ? ? ? LOS: 87 days  ? ?Signature ? ?Susa Raring M.D on 12/25/2021 at 11:29 AM   -  To page go to [email or web address]  ? ?  ? ? ? ? ?

## 2021-12-26 DIAGNOSIS — E871 Hypo-osmolality and hyponatremia: Secondary | ICD-10-CM | POA: Diagnosis not present

## 2021-12-26 LAB — CBC
HCT: 38.7 % — ABNORMAL LOW (ref 39.0–52.0)
Hemoglobin: 12.8 g/dL — ABNORMAL LOW (ref 13.0–17.0)
MCH: 22.9 pg — ABNORMAL LOW (ref 26.0–34.0)
MCHC: 33.1 g/dL (ref 30.0–36.0)
MCV: 69.2 fL — ABNORMAL LOW (ref 80.0–100.0)
Platelets: 164 10*3/uL (ref 150–400)
RBC: 5.59 MIL/uL (ref 4.22–5.81)
RDW: 16.6 % — ABNORMAL HIGH (ref 11.5–15.5)
WBC: 6.8 10*3/uL (ref 4.0–10.5)
nRBC: 0 % (ref 0.0–0.2)

## 2021-12-26 LAB — COMPREHENSIVE METABOLIC PANEL
ALT: 15 U/L (ref 0–44)
AST: 17 U/L (ref 15–41)
Albumin: 3.7 g/dL (ref 3.5–5.0)
Alkaline Phosphatase: 57 U/L (ref 38–126)
Anion gap: 7 (ref 5–15)
BUN: 9 mg/dL (ref 6–20)
CO2: 27 mmol/L (ref 22–32)
Calcium: 8.9 mg/dL (ref 8.9–10.3)
Chloride: 101 mmol/L (ref 98–111)
Creatinine, Ser: 0.63 mg/dL (ref 0.61–1.24)
GFR, Estimated: >60 mL/min (ref >60)
Glucose, Bld: 94 mg/dL (ref 70–99)
Potassium: 4.2 mmol/L (ref 3.5–5.1)
Sodium: 135 mmol/L (ref 135–145)
Total Bilirubin: 0.7 mg/dL (ref 0.3–1.2)
Total Protein: 6.3 g/dL — ABNORMAL LOW (ref 6.5–8.1)

## 2021-12-26 LAB — MAGNESIUM: Magnesium: 1.9 mg/dL (ref 1.7–2.4)

## 2021-12-26 NOTE — Plan of Care (Signed)

## 2021-12-26 NOTE — Progress Notes (Signed)
?      ?                 PROGRESS NOTE ? ?      ?PATIENT DETAILS ?Name: Jim Dunn ?Age: 56 y.o. ?Sex: male ?Date of Birth: 1966/02/13 ?Admit Date: 09/24/2021 ?Admitting Physician Darlin Drop, DO ?PCP:Pcp, No ? ?Brief Summary: ?56 year old with history of schizophrenia, psychogenic polydipsia, seizure disorder, HFrEF, COPD-brought to the ED by his family as they were unable to take care of him as he was consuming a lot of water and trying to burn things.  Hospital course was complicated by worsening hyponatremia due to excess free water intake (noncompliance to fluid restriction) and breakthrough seizures.  Has been deemed not to have capacity by psychiatry-awaiting guardianship.  Per psychiatry-he is unlikely to recover much more cognitive abilities at this point.  He has been stabilized-and is awaiting placement  ? ?Significant studies: ?1/23>> MRI brain: No acute/reversible finding.  Degree of generalized volume loss.  Chronic right posterior parietal arachnoid cyst 6 cm in diameter. ?1/23>> EEG: No seizures ?1/23>> Echo: EF 30-35%, global hypokinesis ? ?Microbiology data: ?2/27>> blood culture: Negative ?2/27>> urine culture: Negative ?2/27>> COVID/influenza PCR: Negative ?1/21>> COVID/influenza PCR: Negative ? ?Consults: ?PCCM, psychiatry, neurology ? ? ?Subjective:   Patient in bed, appears comfortable, denies any headache, no fever, no chest pain or pressure, no shortness of breath , no abdominal pain. No new focal weakness. ? ? ? ?Objective: ?Vitals: ?Blood pressure (!) 154/78, pulse 72, temperature 98.1 ?F (36.7 ?C), temperature source Oral, resp. rate 16, height 5\' 8"  (1.727 m), weight 111.8 kg, SpO2 100 %.  ? ?Exam: ? ?Awake Alert, No new F.N deficits, Normal affect ?[place].AT,PERRAL ?Supple Neck, No JVD,   ?Symmetrical Chest wall movement, Good air movement bilaterally, CTAB ?RRR,No Gallops, Rubs or new Murmurs,  ?+ve B.Sounds, Abd Soft, No tenderness,   ?No Cyanosis, Clubbing or edema   ? ? ? ?Assessment/Plan: ? 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 ? ?Paranoid schizophrenia: Relatively stable-continue Seroquel, Invega and Xanax.  Per prior notes-plan is to continue Xanax as this helps with tobacco cravings and apparently has helped control his symptoms of psychogenic polydipsia in the past. ? ?Dementia: Likely related to years of schizophrenia and nonadherence to medication regimen.  Per psychiatry-does not have capacity-Per prior notes-family not interested in taking on guardianship role.  Awaiting legal guardianship evaluation and placement evaluation. ? ?Seizure disorder: Likely due to hyponatremia-last breakthrough seizure was on 2/26 in the setting of worsening hyponatremia.  Remains on Depakote.  Evaluated by neurology this admission.  EEG was negative. ? ?Chronic HFrEF: Volume status is stable-continue Coreg, digoxin, Entresto and as needed furosemide.  We will need follow-up with cardiology postdischarge. ? ?HLD: Continue Lipitor ? ?COPD: Not in exacerbation-continue bronchodilators. ? ?Peripheral neuropathy: Stable-continue low-dose Lyrica.  Likely related to years of substance abuse. ? ?Obesity: BMI 36, follow with PCP for weight loss. ?  ? ?Code status: ?  Code Status: Full Code  ? ?DVT Prophylaxis: ?Place and maintain sequential compression device Start: 11/01/21 1711 ?  ?Family Communication: None at bedside ? ? ?Disposition Plan: ?Status is: Inpatient ? ?Remains inpatient appropriate because: Medically stable for discharge-Long length of stay patient-awaiting guardianship hearing and placement. ? ? ?Planned Discharge Destination:Group home ? ? ?Diet: ?Diet Order   ? ?       ?  Diet regular Room service appropriate? No;  Fluid consistency: Thin; Fluid restriction: 1200 mL Fluid  Diet effective 1000       ?  ? ?  ?  ? ?  ?  ?Antimicrobial  agents: ?Anti-infectives (From admission, onward)  ? ? None  ? ?  ? ? ?MEDICATIONS: ?Scheduled Meds: ? ALPRAZolam  0.5 mg Oral BID  ? atorvastatin  40 mg Oral Daily  ? carvedilol  12.5 mg Oral BID WC  ? digoxin  0.25 mg Oral Daily  ? divalproex  500 mg Oral BID  ? melatonin  3 mg Oral QHS  ? mirtazapine  15 mg Oral QHS  ? nicotine  14 mg Transdermal Daily  ? paliperidone  6 mg Oral Daily  ? pregabalin  25 mg Oral QHS  ? QUEtiapine  50 mg Oral QHS  ? sacubitril-valsartan  1 tablet Oral BID  ? senna-docusate  2 tablet Oral BID  ? sodium chloride  2 g Oral BID WC  ? ?Continuous Infusions: ?PRN Meds:.acetaminophen **OR** acetaminophen, bisacodyl, haloperidol lactate, hydrALAZINE, ipratropium-albuterol, LORazepam, polyethylene glycol ? ? ?I have personally reviewed following labs and imaging studies ? ?LABORATORY DATA: ? ? ?Recent Labs  ?Lab 12/26/21 ?2440  ?WBC 6.8  ?HGB 12.8*  ?HCT 38.7*  ?PLT 164  ?MCV 69.2*  ?MCH 22.9*  ?MCHC 33.1  ?RDW 16.6*  ? ? ?Recent Labs  ?Lab 12/26/21 ?1027  ?NA 135  ?K 4.2  ?CL 101  ?CO2 27  ?GLUCOSE 94  ?BUN 9  ?CREATININE 0.63  ?CALCIUM 8.9  ?AST 17  ?ALT 15  ?ALKPHOS 57  ?BILITOT 0.7  ?ALBUMIN 3.7  ?MG 1.9  ? ? ? LOS: 88 days  ? ?Signature ? ?Susa Raring M.D on 12/26/2021 at 10:19 AM   -  To page go to [email or web address]  ? ?  ? ? ? ? ?

## 2021-12-27 DIAGNOSIS — E871 Hypo-osmolality and hyponatremia: Secondary | ICD-10-CM | POA: Diagnosis not present

## 2021-12-27 NOTE — Plan of Care (Signed)
No changes. Pt showered. BM today per pt. Abd distended.  ?Problem: Education: ?Goal: Knowledge of General Education information will improve ?Description: Including pain rating scale, medication(s)/side effects and non-pharmacologic comfort measures ?Outcome: Progressing ?  ?Problem: Health Behavior/Discharge Planning: ?Goal: Ability to manage health-related needs will improve ?Outcome: Progressing ?  ?Problem: Clinical Measurements: ?Goal: Ability to maintain clinical measurements within normal limits will improve ?Outcome: Progressing ?  ?Problem: Education: ?Goal: Knowledge of General Education information will improve ?Description: Including pain rating scale, medication(s)/side effects and non-pharmacologic comfort measures ?Outcome: Progressing ?  ?Problem: Health Behavior/Discharge Planning: ?Goal: Ability to manage health-related needs will improve ?Outcome: Progressing ?  ?Problem: Clinical Measurements: ?Goal: Ability to maintain clinical measurements within normal limits will improve ?Outcome: Progressing ?Goal: Will remain free from infection ?Outcome: Progressing ?Goal: Diagnostic test results will improve ?Outcome: Progressing ?Goal: Respiratory complications will improve ?Outcome: Progressing ?Goal: Cardiovascular complication will be avoided ?Outcome: Progressing ?  ?Problem: Activity: ?Goal: Risk for activity intolerance will decrease ?Outcome: Progressing ?  ?Problem: Nutrition: ?Goal: Adequate nutrition will be maintained ?Outcome: Progressing ?  ?Problem: Coping: ?Goal: Level of anxiety will decrease ?Outcome: Progressing ?  ?Problem: Elimination: ?Goal: Will not experience complications related to bowel motility ?Outcome: Progressing ?Goal: Will not experience complications related to urinary retention ?Outcome: Progressing ?  ?Problem: Pain Managment: ?Goal: General experience of comfort will improve ?Outcome: Progressing ?  ?Problem: Safety: ?Goal: Ability to remain free from injury will  improve ?Outcome: Progressing ?  ?Problem: Skin Integrity: ?Goal: Risk for impaired skin integrity will decrease ?Outcome: Progressing ?  ?

## 2021-12-27 NOTE — Progress Notes (Signed)
?      ?                 PROGRESS NOTE ? ?      ?PATIENT DETAILS ?Name: Jim Dunn ?Age: 56 y.o. ?Sex: male ?Date of Birth: 1966/01/26 ?Admit Date: 09/24/2021 ?Admitting Physician Darlin Drop, DO ?PCP:Pcp, No ? ?Brief Summary: ?56 year old with history of schizophrenia, psychogenic polydipsia, seizure disorder, HFrEF, COPD-brought to the ED by his family as they were unable to take care of him as he was consuming a lot of water and trying to burn things.  Hospital course was complicated by worsening hyponatremia due to excess free water intake (noncompliance to fluid restriction) and breakthrough seizures.  Has been deemed not to have capacity by psychiatry-awaiting guardianship.  Per psychiatry-he is unlikely to recover much more cognitive abilities at this point.  He has been stabilized-and is awaiting placement  ? ?Significant studies: ?1/23>> MRI brain: No acute/reversible finding.  Degree of generalized volume loss.  Chronic right posterior parietal arachnoid cyst 6 cm in diameter. ?1/23>> EEG: No seizures ?1/23>> Echo: EF 30-35%, global hypokinesis ? ?Microbiology data: ?2/27>> blood culture: Negative ?2/27>> urine culture: Negative ?2/27>> COVID/influenza PCR: Negative ?1/21>> COVID/influenza PCR: Negative ? ?Consults: ?PCCM, psychiatry, neurology ? ? ?Subjective:   Patient in bed, appears comfortable, denies any headache, no fever, no chest pain or pressure, no shortness of breath , no abdominal pain. No new focal weakness. ? ? ?Objective: ?Vitals: ?Blood pressure 117/90, pulse 63, temperature 98.4 ?F (36.9 ?C), temperature source Oral, resp. rate 18, height 5\' 8"  (1.727 m), weight 111.2 kg, SpO2 99 %.  ? ?Exam: ? ?Awake Alert, No new F.N deficits, Normal affect ?[place].AT,PERRAL ?Supple Neck, No JVD,   ?Symmetrical Chest wall movement, Good air movement bilaterally, CTAB ?RRR,No Gallops, Rubs or new Murmurs,  ?+ve B.Sounds, Abd Soft, No tenderness,   ?No Cyanosis, Clubbing or edema   ? ? ?Assessment/Plan: ? 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 ? ?Paranoid schizophrenia: Relatively stable-continue Seroquel, Invega and Xanax.  Per prior notes-plan is to continue Xanax as this helps with tobacco cravings and apparently has helped control his symptoms of psychogenic polydipsia in the past. ? ?Dementia: Likely related to years of schizophrenia and nonadherence to medication regimen.  Per psychiatry-does not have capacity-Per prior notes-family not interested in taking on guardianship role.  Awaiting legal guardianship evaluation and placement evaluation. ? ?Seizure disorder: Likely due to hyponatremia-last breakthrough seizure was on 2/26 in the setting of worsening hyponatremia.  Remains on Depakote.  Evaluated by neurology this admission.  EEG was negative. ? ?Chronic HFrEF: Volume status is stable - continue Coreg, digoxin, Entresto and as needed furosemide.  We will need follow-up with cardiology postdischarge. ? ?HLD: Continue Lipitor ? ?COPD: Not in exacerbation-continue bronchodilators. ? ?Peripheral neuropathy: Stable-continue low-dose Lyrica.  Likely related to years of substance abuse. ? ?Obesity: BMI 36, follow with PCP for weight loss. ?  ? ?Code status: ?  Code Status: Full Code  ? ?DVT Prophylaxis: ?Place and maintain sequential compression device Start: 11/01/21 1711 ?  ?Family Communication: None at bedside ? ? ?Disposition Plan: ?Status is: Inpatient ? ?Remains inpatient appropriate because: Medically stable for discharge-Long length of stay patient-awaiting guardianship hearing and placement. ? ? ?Planned Discharge Destination:Group home ? ? ?Diet: ?Diet Order   ? ?       ?  Diet regular Room service appropriate? No; Fluid  consistency: Thin; Fluid restriction: 1200 mL Fluid  Diet effective 1000       ?  ? ?  ?  ? ?  ?  ?Antimicrobial  agents: ?Anti-infectives (From admission, onward)  ? ? None  ? ?  ? ? ?MEDICATIONS: ?Scheduled Meds: ? ALPRAZolam  0.5 mg Oral BID  ? atorvastatin  40 mg Oral Daily  ? carvedilol  12.5 mg Oral BID WC  ? digoxin  0.25 mg Oral Daily  ? divalproex  500 mg Oral BID  ? melatonin  3 mg Oral QHS  ? mirtazapine  15 mg Oral QHS  ? nicotine  14 mg Transdermal Daily  ? paliperidone  6 mg Oral Daily  ? pregabalin  25 mg Oral QHS  ? QUEtiapine  50 mg Oral QHS  ? sacubitril-valsartan  1 tablet Oral BID  ? senna-docusate  2 tablet Oral BID  ? sodium chloride  2 g Oral BID WC  ? ?Continuous Infusions: ?PRN Meds:.acetaminophen **OR** acetaminophen, bisacodyl, haloperidol lactate, hydrALAZINE, ipratropium-albuterol, LORazepam, polyethylene glycol ? ? ?I have personally reviewed following labs and imaging studies ? ?LABORATORY DATA: ? ? ?Recent Labs  ?Lab 12/26/21 ?5176  ?WBC 6.8  ?HGB 12.8*  ?HCT 38.7*  ?PLT 164  ?MCV 69.2*  ?MCH 22.9*  ?MCHC 33.1  ?RDW 16.6*  ? ? ?Recent Labs  ?Lab 12/26/21 ?1607  ?NA 135  ?K 4.2  ?CL 101  ?CO2 27  ?GLUCOSE 94  ?BUN 9  ?CREATININE 0.63  ?CALCIUM 8.9  ?AST 17  ?ALT 15  ?ALKPHOS 57  ?BILITOT 0.7  ?ALBUMIN 3.7  ?MG 1.9  ? ? ? LOS: 89 days  ? ?Signature ? ?Susa Raring M.D on 12/27/2021 at 9:26 AM   -  To page go to [email or web address]  ? ?  ? ? ? ? ?

## 2021-12-28 DIAGNOSIS — E871 Hypo-osmolality and hyponatremia: Secondary | ICD-10-CM | POA: Diagnosis not present

## 2021-12-28 NOTE — Progress Notes (Signed)
?      ?                 PROGRESS NOTE ? ?      ?PATIENT DETAILS ?Name: Jim Dunn ?Age: 56 y.o. ?Sex: male ?Date of Birth: 09-20-1965 ?Admit Date: 09/24/2021 ?Admitting Physician Darlin Drop, DO ?PCP:Pcp, No ? ?Brief Summary: ?56 year old with history of schizophrenia, psychogenic polydipsia, seizure disorder, HFrEF, COPD-brought to the ED by his family as they were unable to take care of him as he was consuming a lot of water and trying to burn things.  Hospital course was complicated by worsening hyponatremia due to excess free water intake (noncompliance to fluid restriction) and breakthrough seizures.  Has been deemed not to have capacity by psychiatry-awaiting guardianship.  Per psychiatry-he is unlikely to recover much more cognitive abilities at this point.  He has been stabilized-and is awaiting placement  ? ?Significant studies: ?1/23>> MRI brain: No acute/reversible finding.  Degree of generalized volume loss.  Chronic right posterior parietal arachnoid cyst 6 cm in diameter. ?1/23>> EEG: No seizures ?1/23>> Echo: EF 30-35%, global hypokinesis ? ?Microbiology data: ?2/27>> blood culture: Negative ?2/27>> urine culture: Negative ?2/27>> COVID/influenza PCR: Negative ?1/21>> COVID/influenza PCR: Negative ? ?Consults: ?PCCM, psychiatry, neurology ? ? ?Subjective:   Patient in bed, appears comfortable, denies any headache, no fever, no chest pain or pressure, no shortness of breath , no abdominal pain. No new focal weakness. ? ? ? ?Objective: ?Vitals: ?Blood pressure 124/88, pulse 75, temperature 98.2 ?F (36.8 ?C), temperature source Oral, resp. rate 18, height 5\' 8"  (1.727 m), weight 112.8 kg, SpO2 97 %.  ? ?Exam: ? ?Awake Alert, No new F.N deficits, Normal affect ?[place].AT,PERRAL ?Supple Neck, No JVD,   ?Symmetrical Chest wall movement, Good air movement bilaterally, CTAB ?RRR,No Gallops, Rubs or new Murmurs,  ?+ve B.Sounds, Abd Soft, No tenderness,   ?No Cyanosis, Clubbing or edema   ? ? ? ?Assessment/Plan: ? 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 ? ?Paranoid schizophrenia: Relatively stable-continue Seroquel, Invega and Xanax.  Per prior notes-plan is to continue Xanax as this helps with tobacco cravings and apparently has helped control his symptoms of psychogenic polydipsia in the past. ? ?Dementia: Likely related to years of schizophrenia and nonadherence to medication regimen.  Per psychiatry-does not have capacity-Per prior notes-family not interested in taking on guardianship role.  Awaiting legal guardianship evaluation and placement evaluation. ? ?Seizure disorder: Likely due to hyponatremia-last breakthrough seizure was on 2/26 in the setting of worsening hyponatremia.  Remains on Depakote.  Evaluated by neurology this admission.  EEG was negative. ? ?Chronic HFrEF: Volume status is stable - continue Coreg, digoxin, Entresto and as needed furosemide.  We will need follow-up with cardiology postdischarge. ? ?HLD: Continue Lipitor ? ?COPD: Not in exacerbation-continue bronchodilators. ? ?Peripheral neuropathy: Stable-continue low-dose Lyrica.  Likely related to years of substance abuse. ? ?Obesity: BMI 36, follow with PCP for weight loss. ?  ? ?Code status: ?  Code Status: Full Code  ? ?DVT Prophylaxis: ?Place and maintain sequential compression device Start: 11/01/21 1711 ?  ?Family Communication: None at bedside ? ? ?Disposition Plan: ?Status is: Inpatient ? ?Remains inpatient appropriate because: Medically stable for discharge-Long length of stay patient-awaiting guardianship hearing and placement. ? ? ?Planned Discharge Destination:Group home ? ? ?Diet: ?Diet Order   ? ?       ?  Diet regular Room service appropriate?  No; Fluid consistency: Thin; Fluid restriction: 1200 mL Fluid  Diet effective 1000       ?  ? ?  ?  ? ?  ?   ?Antimicrobial agents: ?Anti-infectives (From admission, onward)  ? ? None  ? ?  ? ? ?MEDICATIONS: ?Scheduled Meds: ? ALPRAZolam  0.5 mg Oral BID  ? atorvastatin  40 mg Oral Daily  ? carvedilol  12.5 mg Oral BID WC  ? digoxin  0.25 mg Oral Daily  ? divalproex  500 mg Oral BID  ? melatonin  3 mg Oral QHS  ? mirtazapine  15 mg Oral QHS  ? nicotine  14 mg Transdermal Daily  ? paliperidone  6 mg Oral Daily  ? pregabalin  25 mg Oral QHS  ? QUEtiapine  50 mg Oral QHS  ? sacubitril-valsartan  1 tablet Oral BID  ? senna-docusate  2 tablet Oral BID  ? sodium chloride  2 g Oral BID WC  ? ?Continuous Infusions: ?PRN Meds:.acetaminophen **OR** acetaminophen, bisacodyl, haloperidol lactate, hydrALAZINE, ipratropium-albuterol, LORazepam, polyethylene glycol ? ? ?I have personally reviewed following labs and imaging studies ? ?LABORATORY DATA: ? ? ?Recent Labs  ?Lab 12/26/21 ?8295  ?WBC 6.8  ?HGB 12.8*  ?HCT 38.7*  ?PLT 164  ?MCV 69.2*  ?MCH 22.9*  ?MCHC 33.1  ?RDW 16.6*  ? ? ?Recent Labs  ?Lab 12/26/21 ?6213  ?NA 135  ?K 4.2  ?CL 101  ?CO2 27  ?GLUCOSE 94  ?BUN 9  ?CREATININE 0.63  ?CALCIUM 8.9  ?AST 17  ?ALT 15  ?ALKPHOS 57  ?BILITOT 0.7  ?ALBUMIN 3.7  ?MG 1.9  ? ? ? LOS: 90 days  ? ?Signature ? ?Susa Raring M.D on 12/28/2021 at 9:48 AM   -  To page go to [email or web address]  ? ?  ? ? ? ? ?

## 2021-12-29 DIAGNOSIS — E871 Hypo-osmolality and hyponatremia: Secondary | ICD-10-CM | POA: Diagnosis not present

## 2021-12-29 NOTE — Progress Notes (Signed)
There are no updates for discharge planning at this time. ? ?Patient's guardianship hearing is 01/25/22. ? ?[name], MSW, LCSW ?Transitions of Care  Clinical Social Worker II ?[phone] ? ?

## 2021-12-29 NOTE — Progress Notes (Signed)
?      ?                 PROGRESS NOTE ? ?      ?PATIENT DETAILS ?Name: Jim Dunn ?Age: 56 y.o. ?Sex: male ?Date of Birth: 06-04-1966 ?Admit Date: 09/24/2021 ?Admitting Physician Darlin Drop, DO ?PCP:Pcp, No ? ?Brief Summary: ?56 year old with history of schizophrenia, psychogenic polydipsia, seizure disorder, HFrEF, COPD-brought to the ED by his family as they were unable to take care of him as he was consuming a lot of water and trying to burn things.  Hospital course was complicated by worsening hyponatremia due to excess free water intake (noncompliance to fluid restriction) and breakthrough seizures.  Has been deemed not to have capacity by psychiatry-awaiting guardianship.  Per psychiatry-he is unlikely to recover much more cognitive abilities at this point.  He has been stabilized-and is awaiting placement  ? ?Significant studies: ?1/23>> MRI brain: No acute/reversible finding.  Degree of generalized volume loss.  Chronic right posterior parietal arachnoid cyst 6 cm in diameter. ?1/23>> EEG: No seizures ?1/23>> Echo: EF 30-35%, global hypokinesis ? ?Microbiology data: ?2/27>> blood culture: Negative ?2/27>> urine culture: Negative ?2/27>> COVID/influenza PCR: Negative ?1/21>> COVID/influenza PCR: Negative ? ?Consults: ?PCCM, psychiatry, neurology ? ? ?Subjective:   Patient in bed, appears comfortable, denies any headache, no fever, no chest pain or pressure, no shortness of breath , no abdominal pain. No new focal weakness. ? ?Objective: ?Vitals: ?Blood pressure 128/80, pulse 70, temperature 97.8 ?F (36.6 ?C), temperature source Oral, resp. rate 16, height 5\' 8"  (1.727 m), weight 112.6 kg, SpO2 97 %.  ? ?Exam: ? ?Awake Alert, No new F.N deficits, Normal affect ?[place].AT,PERRAL ?Supple Neck, No JVD,   ?Symmetrical Chest wall movement, Good air movement bilaterally, CTAB ?RRR,No Gallops, Rubs or new Murmurs,  ?+ve B.Sounds, Abd Soft, No tenderness,   ?No Cyanosis, Clubbing or edema   ? ? ?Assessment/Plan: ? 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 ? ?Paranoid schizophrenia: Relatively stable-continue Seroquel, Invega and Xanax.  Per prior notes-plan is to continue Xanax as this helps with tobacco cravings and apparently has helped control his symptoms of psychogenic polydipsia in the past. ? ?Dementia: Likely related to years of schizophrenia and nonadherence to medication regimen.  Per psychiatry-does not have capacity-Per prior notes-family not interested in taking on guardianship role.  Awaiting legal guardianship evaluation and placement evaluation. ? ?Seizure disorder: Likely due to hyponatremia-last breakthrough seizure was on 2/26 in the setting of worsening hyponatremia.  Remains on Depakote.  Evaluated by neurology this admission.  EEG was negative. ? ?Chronic HFrEF: Volume status is stable - continue Coreg, digoxin, Entresto and as needed furosemide.  We will need follow-up with cardiology postdischarge. ? ?HLD: Continue Lipitor ? ?COPD: Not in exacerbation-continue bronchodilators. ? ?Peripheral neuropathy: Stable-continue low-dose Lyrica.  Likely related to years of substance abuse. ? ?Obesity: BMI 36, follow with PCP for weight loss. ?  ? ?Code status: ?  Code Status: Full Code  ? ?DVT Prophylaxis: ?Place and maintain sequential compression device Start: 11/01/21 1711 ?  ?Family Communication: None at bedside ? ? ?Disposition Plan: ?Status is: Inpatient ? ?Remains inpatient appropriate because: Medically stable for discharge-Long length of stay patient-awaiting guardianship hearing and placement. ? ? ?Planned Discharge Destination:Group home ? ? ?Diet: ?Diet Order   ? ?       ?  Diet regular Room service appropriate? No; Fluid consistency:  Thin; Fluid restriction: 1200 mL Fluid  Diet effective 1000       ?  ? ?  ?  ? ?  ?  ?Antimicrobial  agents: ?Anti-infectives (From admission, onward)  ? ? None  ? ?  ? ? ?MEDICATIONS: ?Scheduled Meds: ? ALPRAZolam  0.5 mg Oral BID  ? atorvastatin  40 mg Oral Daily  ? carvedilol  12.5 mg Oral BID WC  ? digoxin  0.25 mg Oral Daily  ? divalproex  500 mg Oral BID  ? melatonin  3 mg Oral QHS  ? mirtazapine  15 mg Oral QHS  ? nicotine  14 mg Transdermal Daily  ? paliperidone  6 mg Oral Daily  ? pregabalin  25 mg Oral QHS  ? QUEtiapine  50 mg Oral QHS  ? sacubitril-valsartan  1 tablet Oral BID  ? senna-docusate  2 tablet Oral BID  ? sodium chloride  2 g Oral BID WC  ? ?Continuous Infusions: ?PRN Meds:.acetaminophen **OR** acetaminophen, bisacodyl, haloperidol lactate, hydrALAZINE, ipratropium-albuterol, LORazepam, polyethylene glycol ? ? ?I have personally reviewed following labs and imaging studies ? ?LABORATORY DATA: ? ? ?Recent Labs  ?Lab 12/26/21 ?0814  ?WBC 6.8  ?HGB 12.8*  ?HCT 38.7*  ?PLT 164  ?MCV 69.2*  ?MCH 22.9*  ?MCHC 33.1  ?RDW 16.6*  ? ? ?Recent Labs  ?Lab 12/26/21 ?4818  ?NA 135  ?K 4.2  ?CL 101  ?CO2 27  ?GLUCOSE 94  ?BUN 9  ?CREATININE 0.63  ?CALCIUM 8.9  ?AST 17  ?ALT 15  ?ALKPHOS 57  ?BILITOT 0.7  ?ALBUMIN 3.7  ?MG 1.9  ? ? ? LOS: 91 days  ? ?Signature ? ?Susa Raring M.D on 12/29/2021 at 10:44 AM   -  To page go to [email or web address]  ? ?  ? ? ? ? ?

## 2021-12-30 DIAGNOSIS — E871 Hypo-osmolality and hyponatremia: Secondary | ICD-10-CM | POA: Diagnosis not present

## 2021-12-30 NOTE — Progress Notes (Signed)
?      ?                 PROGRESS NOTE ? ?      ?PATIENT DETAILS ?Name: Jim Dunn ?Age: 56 y.o. ?Sex: male ?Date of Birth: October 02, 1965 ?Admit Date: 09/24/2021 ?Admitting Physician Darlin Drop, DO ?PCP:Pcp, No ? ?Brief Summary: ?56 year old with history of schizophrenia, psychogenic polydipsia, seizure disorder, HFrEF, COPD-brought to the ED by his family as they were unable to take care of him as he was consuming a lot of water and trying to burn things.  Hospital course was complicated by worsening hyponatremia due to excess free water intake (noncompliance to fluid restriction) and breakthrough seizures.  Has been deemed not to have capacity by psychiatry-awaiting guardianship.  Per psychiatry-he is unlikely to recover much more cognitive abilities at this point.  He has been stabilized-and is awaiting placement  ? ?Significant studies: ?1/23>> MRI brain: No acute/reversible finding.  Degree of generalized volume loss.  Chronic right posterior parietal arachnoid cyst 6 cm in diameter. ?1/23>> EEG: No seizures ?1/23>> Echo: EF 30-35%, global hypokinesis ? ?Microbiology data: ?2/27>> blood culture: Negative ?2/27>> urine culture: Negative ?2/27>> COVID/influenza PCR: Negative ?1/21>> COVID/influenza PCR: Negative ? ?Consults: ?PCCM, psychiatry, neurology ? ? ?Subjective:  Patient in bed, appears comfortable, denies any headache, no fever, no chest pain or pressure, no shortness of breath , no abdominal pain. No new focal weakness. ? ? ?Objective: ?Vitals: ?Blood pressure 110/65, pulse 76, temperature 98.8 ?F (37.1 ?C), temperature source Oral, resp. rate 14, height 5\' 8"  (1.727 m), weight 112.6 kg, SpO2 97 %.  ? ?Exam: ? ?Awake Alert, No new F.N deficits, Normal affect ?[place].AT,PERRAL ?Supple Neck, No JVD,   ?Symmetrical Chest wall movement, Good air movement bilaterally, CTAB ?RRR,No Gallops, Rubs or new Murmurs,  ?+ve B.Sounds, Abd Soft, No tenderness,   ?No Cyanosis, Clubbing or edema   ? ? ?Assessment/Plan: ? 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 ? ?Paranoid schizophrenia: Relatively stable-continue Seroquel, Invega and Xanax.  Per prior notes-plan is to continue Xanax as this helps with tobacco cravings and apparently has helped control his symptoms of psychogenic polydipsia in the past. ? ?Dementia: Likely related to years of schizophrenia and nonadherence to medication regimen.  Per psychiatry-does not have capacity-Per prior notes-family not interested in taking on guardianship role.  Awaiting legal guardianship evaluation and placement evaluation. ? ?Seizure disorder: Likely due to hyponatremia-last breakthrough seizure was on 2/26 in the setting of worsening hyponatremia.  Remains on Depakote.  Evaluated by neurology this admission.  EEG was negative. ? ?Chronic HFrEF: Volume status is stable - continue Coreg, digoxin, Entresto and as needed furosemide.  We will need follow-up with cardiology postdischarge. ? ?HLD: Continue Lipitor ? ?COPD: Not in exacerbation-continue bronchodilators. ? ?Peripheral neuropathy: Stable-continue low-dose Lyrica.  Likely related to years of substance abuse. ? ?Obesity: BMI 36, follow with PCP for weight loss. ?  ? ?Code status: ?  Code Status: Full Code  ? ?DVT Prophylaxis: ?Place and maintain sequential compression device Start: 11/01/21 1711 ?  ?Family Communication: None at bedside ? ? ?Disposition Plan: ?Status is: Inpatient ? ?Remains inpatient appropriate because: Medically stable for discharge-Long length of stay patient-awaiting guardianship hearing and placement. ? ? ?Planned Discharge Destination:Group home ? ? ?Diet: ?Diet Order   ? ?       ?  Diet regular Room service appropriate? No; Fluid consistency:  Thin; Fluid restriction: 1200 mL Fluid  Diet effective 1000       ?  ? ?  ?  ? ?  ?  ?Antimicrobial  agents: ?Anti-infectives (From admission, onward)  ? ? None  ? ?  ? ? ?MEDICATIONS: ?Scheduled Meds: ? ALPRAZolam  0.5 mg Oral BID  ? atorvastatin  40 mg Oral Daily  ? carvedilol  12.5 mg Oral BID WC  ? digoxin  0.25 mg Oral Daily  ? divalproex  500 mg Oral BID  ? melatonin  3 mg Oral QHS  ? mirtazapine  15 mg Oral QHS  ? nicotine  14 mg Transdermal Daily  ? paliperidone  6 mg Oral Daily  ? pregabalin  25 mg Oral QHS  ? QUEtiapine  50 mg Oral QHS  ? sacubitril-valsartan  1 tablet Oral BID  ? senna-docusate  2 tablet Oral BID  ? sodium chloride  2 g Oral BID WC  ? ?Continuous Infusions: ?PRN Meds:.acetaminophen **OR** acetaminophen, bisacodyl, haloperidol lactate, hydrALAZINE, ipratropium-albuterol, LORazepam, polyethylene glycol ? ? ?I have personally reviewed following labs and imaging studies ? ?LABORATORY DATA: ? ? ?Recent Labs  ?Lab 12/26/21 ?7893  ?WBC 6.8  ?HGB 12.8*  ?HCT 38.7*  ?PLT 164  ?MCV 69.2*  ?MCH 22.9*  ?MCHC 33.1  ?RDW 16.6*  ? ? ?Recent Labs  ?Lab 12/26/21 ?8101  ?NA 135  ?K 4.2  ?CL 101  ?CO2 27  ?GLUCOSE 94  ?BUN 9  ?CREATININE 0.63  ?CALCIUM 8.9  ?AST 17  ?ALT 15  ?ALKPHOS 57  ?BILITOT 0.7  ?ALBUMIN 3.7  ?MG 1.9  ? ? ? LOS: 92 days  ? ?Signature ? ?Susa Raring M.D on 12/30/2021 at 10:43 AM   -  To page go to [email or web address]  ? ?  ? ? ? ? ?

## 2021-12-31 DIAGNOSIS — E871 Hypo-osmolality and hyponatremia: Secondary | ICD-10-CM | POA: Diagnosis not present

## 2021-12-31 NOTE — Progress Notes (Signed)
?      ?                 PROGRESS NOTE ? ?      ?PATIENT DETAILS ?Name: Jim Dunn ?Age: 56 y.o. ?Sex: male ?Date of Birth: 07-03-1966 ?Admit Date: 09/24/2021 ?Admitting Physician Darlin Drop, DO ?PCP:Pcp, No ? ?Brief Summary: ?56 year old with history of schizophrenia, psychogenic polydipsia, seizure disorder, HFrEF, COPD-brought to the ED by his family as they were unable to take care of him as he was consuming a lot of water and trying to burn things.  Hospital course was complicated by worsening hyponatremia due to excess free water intake (noncompliance to fluid restriction) and breakthrough seizures.  Has been deemed not to have capacity by psychiatry-awaiting guardianship.  Per psychiatry-he is unlikely to recover much more cognitive abilities at this point.  He has been stabilized-and is awaiting placement  ? ?Significant studies: ?1/23>> MRI brain: No acute/reversible finding.  Degree of generalized volume loss.  Chronic right posterior parietal arachnoid cyst 6 cm in diameter. ?1/23>> EEG: No seizures ?1/23>> Echo: EF 30-35%, global hypokinesis ? ?Microbiology data: ?2/27>> blood culture: Negative ?2/27>> urine culture: Negative ?2/27>> COVID/influenza PCR: Negative ?1/21>> COVID/influenza PCR: Negative ? ?Consults: ?PCCM, psychiatry, neurology ? ? ?Subjective:  Patient in bed, appears comfortable, denies any headache, no fever, no chest pain or pressure, no shortness of breath , no abdominal pain. No new focal weakness. ? ?Objective: ?Vitals: ?Blood pressure 128/70, pulse 67, temperature (!) 97.4 ?F (36.3 ?C), temperature source Oral, resp. rate 18, height 5\' 8"  (1.727 m), weight 112.4 kg, SpO2 95 %.  ? ?Exam: ? ?Awake Alert, No new F.N deficits, Normal affect ?[place].AT,PERRAL ?Supple Neck, No JVD,   ?Symmetrical Chest wall movement, Good air movement bilaterally, CTAB ?RRR,No Gallops, Rubs or new Murmurs,  ?+ve B.Sounds, Abd Soft, No tenderness,   ?No Cyanosis, Clubbing or edema   ? ? ?Assessment/Plan: ? 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 ? ?Paranoid schizophrenia: Relatively stable-continue Seroquel, Invega and Xanax.  Per prior notes-plan is to continue Xanax as this helps with tobacco cravings and apparently has helped control his symptoms of psychogenic polydipsia in the past. ? ?Dementia: Likely related to years of schizophrenia and nonadherence to medication regimen.  Per psychiatry-does not have capacity-Per prior notes-family not interested in taking on guardianship role.  Awaiting legal guardianship evaluation and placement evaluation. ? ?Seizure disorder: Likely due to hyponatremia-last breakthrough seizure was on 2/26 in the setting of worsening hyponatremia.  Remains on Depakote.  Evaluated by neurology this admission.  EEG was negative. ? ?Chronic HFrEF: Volume status is stable - continue Coreg, digoxin, Entresto and as needed furosemide.  We will need follow-up with cardiology postdischarge. ? ?HLD: Continue Lipitor ? ?COPD: Not in exacerbation-continue bronchodilators. ? ?Peripheral neuropathy: Stable-continue low-dose Lyrica.  Likely related to years of substance abuse. ? ?Obesity: BMI 36, follow with PCP for weight loss. ?  ? ?Code status: ?  Code Status: Full Code  ? ?DVT Prophylaxis: ?Place and maintain sequential compression device Start: 11/01/21 1711 ?  ?Family Communication: None at bedside ? ? ?Disposition Plan: ?Status is: Inpatient ? ?Remains inpatient appropriate because: Medically stable for discharge-Long length of stay patient-awaiting guardianship hearing and placement. ? ? ?Planned Discharge Destination:Group home ? ? ?Diet: ?Diet Order   ? ?       ?  Diet regular Room service appropriate? No;  Fluid consistency: Thin; Fluid restriction: 1200 mL Fluid  Diet effective 1000       ?  ? ?  ?  ? ?  ?   ?Antimicrobial agents: ?Anti-infectives (From admission, onward)  ? ? None  ? ?  ? ? ?MEDICATIONS: ?Scheduled Meds: ? ALPRAZolam  0.5 mg Oral BID  ? atorvastatin  40 mg Oral Daily  ? carvedilol  12.5 mg Oral BID WC  ? digoxin  0.25 mg Oral Daily  ? divalproex  500 mg Oral BID  ? melatonin  3 mg Oral QHS  ? mirtazapine  15 mg Oral QHS  ? nicotine  14 mg Transdermal Daily  ? paliperidone  6 mg Oral Daily  ? pregabalin  25 mg Oral QHS  ? QUEtiapine  50 mg Oral QHS  ? sacubitril-valsartan  1 tablet Oral BID  ? senna-docusate  2 tablet Oral BID  ? sodium chloride  2 g Oral BID WC  ? ?Continuous Infusions: ?PRN Meds:.acetaminophen **OR** acetaminophen, bisacodyl, haloperidol lactate, hydrALAZINE, ipratropium-albuterol, LORazepam, polyethylene glycol ? ? ?I have personally reviewed following labs and imaging studies ? ?LABORATORY DATA: ? ? ?Recent Labs  ?Lab 12/26/21 ?4888  ?WBC 6.8  ?HGB 12.8*  ?HCT 38.7*  ?PLT 164  ?MCV 69.2*  ?MCH 22.9*  ?MCHC 33.1  ?RDW 16.6*  ? ? ?Recent Labs  ?Lab 12/26/21 ?9169  ?NA 135  ?K 4.2  ?CL 101  ?CO2 27  ?GLUCOSE 94  ?BUN 9  ?CREATININE 0.63  ?CALCIUM 8.9  ?AST 17  ?ALT 15  ?ALKPHOS 57  ?BILITOT 0.7  ?ALBUMIN 3.7  ?MG 1.9  ? ? ? LOS: 93 days  ? ?Signature ? ?Susa Raring M.D on 12/31/2021 at 8:40 AM   -  To page go to [email or web address]  ? ?  ? ? ? ? ?

## 2022-01-01 DIAGNOSIS — E871 Hypo-osmolality and hyponatremia: Secondary | ICD-10-CM | POA: Diagnosis not present

## 2022-01-01 NOTE — Progress Notes (Signed)
?      ?                 PROGRESS NOTE ? ?      ?PATIENT DETAILS ?Name: Jim Dunn ?Age: 56 y.o. ?Sex: male ?Date of Birth: 1965-10-27 ?Admit Date: 09/24/2021 ?Admitting Physician Darlin Drop, DO ?PCP:Pcp, No ? ?Brief Summary: ?56 year old with history of schizophrenia, psychogenic polydipsia, seizure disorder, HFrEF, COPD-brought to the ED by his family as they were unable to take care of him as he was consuming a lot of water and trying to burn things.  Hospital course was complicated by worsening hyponatremia due to excess free water intake (noncompliance to fluid restriction) and breakthrough seizures.  Has been deemed not to have capacity by psychiatry-awaiting guardianship.  Per psychiatry-he is unlikely to recover much more cognitive abilities at this point.  He has been stabilized-and is awaiting placement  ? ?Significant studies: ?1/23>> MRI brain: No acute/reversible finding.  Degree of generalized volume loss.  Chronic right posterior parietal arachnoid cyst 6 cm in diameter. ?1/23>> EEG: No seizures ?1/23>> Echo: EF 30-35%, global hypokinesis ? ?Microbiology data: ?2/27>> blood culture: Negative ?2/27>> urine culture: Negative ?2/27>> COVID/influenza PCR: Negative ?1/21>> COVID/influenza PCR: Negative ? ?Consults: ?PCCM, psychiatry, neurology ? ? ?Subjective:  Patient in bed, appears comfortable, denies any headache, no fever, no chest pain or pressure, no shortness of breath , no abdominal pain. No new focal weakness. ? ? ?Objective: ?Vitals: ?Blood pressure 131/82, pulse 67, temperature (!) 97.5 ?F (36.4 ?C), temperature source Oral, resp. rate 18, height 5\' 8"  (1.727 m), weight 112.4 kg, SpO2 96 %.  ? ?Exam: ? ?Awake Alert, No new F.N deficits,   ?[place].AT,PERRAL ?Supple Neck, No JVD,   ?Symmetrical Chest wall movement, Good air movement bilaterally, CTAB ?RRR,No Gallops, Rubs or new Murmurs,  ?+ve B.Sounds, Abd Soft, No tenderness,   ?No Cyanosis, Clubbing or edema  ? ? ? ?Assessment/Plan: ? 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 ? ?Paranoid schizophrenia: Relatively stable-continue Seroquel, Invega and Xanax.  Per prior notes-plan is to continue Xanax as this helps with tobacco cravings and apparently has helped control his symptoms of psychogenic polydipsia in the past. ? ?Dementia: Likely related to years of schizophrenia and nonadherence to medication regimen.  Per psychiatry-does not have capacity-Per prior notes-family not interested in taking on guardianship role.  Awaiting legal guardianship evaluation and placement evaluation. ? ?Seizure disorder: Likely due to hyponatremia-last breakthrough seizure was on 2/26 in the setting of worsening hyponatremia.  Remains on Depakote.  Evaluated by neurology this admission.  EEG was negative. ? ?Chronic HFrEF: Volume status is stable - continue Coreg, digoxin, Entresto and as needed furosemide.  We will need follow-up with cardiology postdischarge. ? ?HLD: Continue Lipitor ? ?COPD: Not in exacerbation-continue bronchodilators. ? ?Peripheral neuropathy: Stable-continue low-dose Lyrica.  Likely related to years of substance abuse. ? ?Obesity: BMI 36, follow with PCP for weight loss. ?  ? ?Code status: ?  Code Status: Full Code  ? ?DVT Prophylaxis: ?Place and maintain sequential compression device Start: 11/01/21 1711 ?  ?Family Communication: None at bedside ? ? ?Disposition Plan: ?Status is: Inpatient ? ?Remains inpatient appropriate because: Medically stable for discharge-Long length of stay patient-awaiting guardianship hearing and placement. ? ? ?Planned Discharge Destination:Group home ? ? ?Diet: ?Diet Order   ? ?       ?  Diet regular Room service  appropriate? No; Fluid consistency: Thin; Fluid restriction: 1200 mL Fluid  Diet effective 1000       ?  ? ?  ?  ? ?  ?  ?Antimicrobial agents: ?Anti-infectives  (From admission, onward)  ? ? None  ? ?  ? ? ?MEDICATIONS: ?Scheduled Meds: ? ALPRAZolam  0.5 mg Oral BID  ? atorvastatin  40 mg Oral Daily  ? carvedilol  12.5 mg Oral BID WC  ? digoxin  0.25 mg Oral Daily  ? divalproex  500 mg Oral BID  ? melatonin  3 mg Oral QHS  ? mirtazapine  15 mg Oral QHS  ? nicotine  14 mg Transdermal Daily  ? paliperidone  6 mg Oral Daily  ? pregabalin  25 mg Oral QHS  ? QUEtiapine  50 mg Oral QHS  ? sacubitril-valsartan  1 tablet Oral BID  ? senna-docusate  2 tablet Oral BID  ? sodium chloride  2 g Oral BID WC  ? ?Continuous Infusions: ?PRN Meds:.acetaminophen **OR** acetaminophen, bisacodyl, haloperidol lactate, hydrALAZINE, ipratropium-albuterol, LORazepam, polyethylene glycol ? ? ?I have personally reviewed following labs and imaging studies ? ?LABORATORY DATA: ? ? ?Recent Labs  ?Lab 12/26/21 ?3151  ?WBC 6.8  ?HGB 12.8*  ?HCT 38.7*  ?PLT 164  ?MCV 69.2*  ?MCH 22.9*  ?MCHC 33.1  ?RDW 16.6*  ? ? ?Recent Labs  ?Lab 12/26/21 ?7616  ?NA 135  ?K 4.2  ?CL 101  ?CO2 27  ?GLUCOSE 94  ?BUN 9  ?CREATININE 0.63  ?CALCIUM 8.9  ?AST 17  ?ALT 15  ?ALKPHOS 57  ?BILITOT 0.7  ?ALBUMIN 3.7  ?MG 1.9  ? ? ? LOS: 94 days  ? ?Signature ? ?Susa Raring M.D on 01/01/2022 at 10:25 AM   -  To page go to [email or web address]  ? ?  ? ? ? ? ?

## 2022-01-02 DIAGNOSIS — E871 Hypo-osmolality and hyponatremia: Secondary | ICD-10-CM | POA: Diagnosis not present

## 2022-01-02 LAB — COMPREHENSIVE METABOLIC PANEL
ALT: 12 U/L (ref 0–44)
AST: 17 U/L (ref 15–41)
Albumin: 3.5 g/dL (ref 3.5–5.0)
Alkaline Phosphatase: 61 U/L (ref 38–126)
Anion gap: 6 (ref 5–15)
BUN: 5 mg/dL — ABNORMAL LOW (ref 6–20)
CO2: 26 mmol/L (ref 22–32)
Calcium: 8.5 mg/dL — ABNORMAL LOW (ref 8.9–10.3)
Chloride: 99 mmol/L (ref 98–111)
Creatinine, Ser: 0.66 mg/dL (ref 0.61–1.24)
GFR, Estimated: >60 mL/min (ref >60)
Glucose, Bld: 102 mg/dL — ABNORMAL HIGH (ref 70–99)
Potassium: 3.6 mmol/L (ref 3.5–5.1)
Sodium: 131 mmol/L — ABNORMAL LOW (ref 135–145)
Total Bilirubin: 0.6 mg/dL (ref 0.3–1.2)
Total Protein: 6 g/dL — ABNORMAL LOW (ref 6.5–8.1)

## 2022-01-02 LAB — CBC
HCT: 36.1 % — ABNORMAL LOW (ref 39.0–52.0)
Hemoglobin: 11.7 g/dL — ABNORMAL LOW (ref 13.0–17.0)
MCH: 22.5 pg — ABNORMAL LOW (ref 26.0–34.0)
MCHC: 32.4 g/dL (ref 30.0–36.0)
MCV: 69.6 fL — ABNORMAL LOW (ref 80.0–100.0)
Platelets: 149 10*3/uL — ABNORMAL LOW (ref 150–400)
RBC: 5.19 MIL/uL (ref 4.22–5.81)
RDW: 15.9 % — ABNORMAL HIGH (ref 11.5–15.5)
WBC: 7.2 10*3/uL (ref 4.0–10.5)
nRBC: 0 % (ref 0.0–0.2)

## 2022-01-02 LAB — MAGNESIUM: Magnesium: 1.8 mg/dL (ref 1.7–2.4)

## 2022-01-02 NOTE — Progress Notes (Signed)
?      ?                 PROGRESS NOTE ? ?      ?PATIENT DETAILS ?Name: Jim Dunn ?Age: 56 y.o. ?Sex: male ?Date of Birth: 03-02-1966 ?Admit Date: 09/24/2021 ?Admitting Physician Darlin Drop, DO ?PCP:Pcp, No ? ?Brief Summary: ?56 year old with history of schizophrenia, psychogenic polydipsia, seizure disorder, HFrEF, COPD-brought to the ED by his family as they were unable to take care of him as he was consuming a lot of water and trying to burn things.  Hospital course was complicated by worsening hyponatremia due to excess free water intake (noncompliance to fluid restriction) and breakthrough seizures.  Has been deemed not to have capacity by psychiatry-awaiting guardianship.  Per psychiatry-he is unlikely to recover much more cognitive abilities at this point.  He has been stabilized-and is awaiting placement  ? ?Significant studies: ?1/23>> MRI brain: No acute/reversible finding.  Degree of generalized volume loss.  Chronic right posterior parietal arachnoid cyst 6 cm in diameter. ?1/23>> EEG: No seizures ?1/23>> Echo: EF 30-35%, global hypokinesis ? ?Microbiology data: ?2/27>> blood culture: Negative ?2/27>> urine culture: Negative ?2/27>> COVID/influenza PCR: Negative ?1/21>> COVID/influenza PCR: Negative ? ?Consults: ?PCCM, psychiatry, neurology ? ? ?Subjective: Patient in bed, appears comfortable, denies any headache, no fever, no chest pain or pressure, no shortness of breath , no abdominal pain. No focal weakness. ? ? ?Objective: ?Vitals: ?Blood pressure (!) 148/83, pulse 78, temperature (!) 97.2 ?F (36.2 ?C), temperature source Oral, resp. rate 16, height 5\' 8"  (1.727 m), weight 114.9 kg, SpO2 100 %.  ? ?Exam: ? ?Awake Alert, No new F.N deficits, Normal affect ?[place].AT,PERRAL ?Supple Neck, No JVD,   ?Symmetrical Chest wall movement, Good air movement bilaterally, CTAB ?RRR,No Gallops, Rubs or new Murmurs,  ?+ve B.Sounds, Abd Soft, No tenderness,   ?No Cyanosis, Clubbing or edema   ? ?Assessment/Plan: ? 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  Counseled again on total fluid restriction, staff again updated on 01/02/2022 ? ?Paranoid schizophrenia: Relatively stable-continue Seroquel, Invega and Xanax.  Per prior notes-plan is to continue Xanax as this helps with tobacco cravings and apparently has helped control his symptoms of psychogenic polydipsia in the past. ? ?Dementia: Likely related to years of schizophrenia and nonadherence to medication regimen.  Per psychiatry-does not have capacity-Per prior notes-family not interested in taking on guardianship role.  Awaiting legal guardianship evaluation and placement evaluation. ? ?Seizure disorder: Likely due to hyponatremia-last breakthrough seizure was on 2/26 in the setting of worsening hyponatremia.  Remains on Depakote.  Evaluated by neurology this admission.  EEG was negative. ? ?Chronic HFrEF: Volume status is stable - continue Coreg, digoxin, Entresto and as needed furosemide.  We will need follow-up with cardiology postdischarge. ? ?HLD: Continue Lipitor ? ?COPD: Not in exacerbation-continue bronchodilators. ? ?Peripheral neuropathy: Stable-continue low-dose Lyrica.  Likely related to years of substance abuse. ? ?Obesity: BMI 36, follow with PCP for weight loss. ?  ? ?Code status: ?  Code Status: Full Code  ? ?DVT Prophylaxis: ?Place and maintain sequential compression device Start: 11/01/21 1711 ?  ?Family Communication: None at bedside ? ? ?Disposition Plan: ?Status is: Inpatient ? ?Remains inpatient appropriate because: Medically stable for discharge-Long length of stay patient-awaiting guardianship hearing and placement. ? ? ?Planned Discharge Destination:Group home ? ? ?Diet: ?Diet Order   ? ?       ?  Diet regular Room service appropriate? No; Fluid consistency: Thin; Fluid  restriction: 1200 mL Fluid  Diet effective 1000       ?  ? ?  ?  ? ?  ?  ?Antimicrobial agents: ?Anti-infectives (From admission, onward)  ? ? None  ? ?  ? ? ?MEDICATIONS: ?Scheduled Meds: ? ALPRAZolam  0.5 mg Oral BID  ? atorvastatin  40 mg Oral Daily  ? carvedilol  12.5 mg Oral BID WC  ? digoxin  0.25 mg Oral Daily  ? divalproex  500 mg Oral BID  ? melatonin  3 mg Oral QHS  ? mirtazapine  15 mg Oral QHS  ? nicotine  14 mg Transdermal Daily  ? paliperidone  6 mg Oral Daily  ? pregabalin  25 mg Oral QHS  ? QUEtiapine  50 mg Oral QHS  ? sacubitril-valsartan  1 tablet Oral BID  ? senna-docusate  2 tablet Oral BID  ? sodium chloride  2 g Oral BID WC  ? ?Continuous Infusions: ?PRN Meds:.acetaminophen **OR** acetaminophen, bisacodyl, haloperidol lactate, hydrALAZINE, ipratropium-albuterol, LORazepam, polyethylene glycol ? ? ?I have personally reviewed following labs and imaging studies ? ?LABORATORY DATA: ? ? ?Recent Labs  ?Lab 01/02/22 ?0717  ?WBC 7.2  ?HGB 11.7*  ?HCT 36.1*  ?PLT 149*  ?MCV 69.6*  ?MCH 22.5*  ?MCHC 32.4  ?RDW 15.9*  ? ? ?Recent Labs  ?Lab 01/02/22 ?0717  ?NA 131*  ?K 3.6  ?CL 99  ?CO2 26  ?GLUCOSE 102*  ?BUN 5*  ?CREATININE 0.66  ?CALCIUM 8.5*  ?AST 17  ?ALT 12  ?ALKPHOS 61  ?BILITOT 0.6  ?ALBUMIN 3.5  ?MG 1.8  ? ? ? LOS: 95 days  ? ?Signature ? ?Susa Raring M.D on 01/02/2022 at 10:28 AM   -  To page go to [email or web address]  ? ?  ? ? ? ? ?

## 2022-01-03 DIAGNOSIS — E871 Hypo-osmolality and hyponatremia: Secondary | ICD-10-CM | POA: Diagnosis not present

## 2022-01-03 MED ORDER — FUROSEMIDE 40 MG PO TABS
40.0000 mg | ORAL_TABLET | Freq: Once | ORAL | Status: AC
  Administered 2022-01-03: 40 mg via ORAL
  Filled 2022-01-03: qty 1

## 2022-01-03 MED ORDER — POTASSIUM CHLORIDE CRYS ER 20 MEQ PO TBCR
40.0000 meq | EXTENDED_RELEASE_TABLET | Freq: Once | ORAL | Status: AC
  Administered 2022-01-03: 40 meq via ORAL
  Filled 2022-01-03: qty 2

## 2022-01-03 NOTE — Progress Notes (Signed)
?      ?                 PROGRESS NOTE ? ?      ?PATIENT DETAILS ?Name: Jim Dunn ?Age: 56 y.o. ?Sex: male ?Date of Birth: 1966/08/23 ?Admit Date: 09/24/2021 ?Admitting Physician Darlin Drop, DO ?PCP:Pcp, No ? ?Brief Summary: ?57 year old with history of schizophrenia, psychogenic polydipsia, seizure disorder, HFrEF, COPD-brought to the ED by his family as they were unable to take care of him as he was consuming a lot of water and trying to burn things.  Hospital course was complicated by worsening hyponatremia due to excess free water intake (noncompliance to fluid restriction) and breakthrough seizures.  Has been deemed not to have capacity by psychiatry-awaiting guardianship.  Per psychiatry-he is unlikely to recover much more cognitive abilities at this point.  He has been stabilized-and is awaiting placement  ? ?Significant studies: ?1/23>> MRI brain: No acute/reversible finding.  Degree of generalized volume loss.  Chronic right posterior parietal arachnoid cyst 6 cm in diameter. ?1/23>> EEG: No seizures ?1/23>> Echo: EF 30-35%, global hypokinesis ? ?Microbiology data: ?2/27>> blood culture: Negative ?2/27>> urine culture: Negative ?2/27>> COVID/influenza PCR: Negative ?1/21>> COVID/influenza PCR: Negative ? ?Consults: ?PCCM, psychiatry, neurology ? ? ?Subjective: Patient in bed, appears comfortable, denies any headache, no fever, no chest pain or pressure, no shortness of breath , no abdominal pain. No new focal weakness. ? ? ?Objective: ?Vitals: ?Blood pressure 117/62, pulse 80, temperature 98.6 ?F (37 ?C), temperature source Oral, resp. rate 18, height 5\' 8"  (1.727 m), weight 124.2 kg, SpO2 98 %.  ? ?Exam: ? ?Awake Alert, No new F.N deficits, Normal affect ?[place].AT,PERRAL ?Supple Neck, No JVD,   ?Symmetrical Chest wall movement, Good air movement bilaterally, CTAB ?RRR,No Gallops, Rubs or new Murmurs,  ?+ve B.Sounds, Abd Soft, No tenderness,   ?No Cyanosis, Clubbing or edema  ? ? ?Assessment/Plan: ? 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  Counseled again on total fluid restriction, staff again updated on 01/02/2022, gentle 1 time lasix with KDur on 01/03/22, check BMP in am again. ? ?Chronic HFrEF: Volume status is stable - continue Coreg, digoxin, Entresto and as needed furosemide.  We will need follow-up with cardiology postdischarge. ? ?Paranoid schizophrenia: Relatively stable-continue Seroquel, Invega and Xanax.  Per prior notes-plan is to continue Xanax as this helps with tobacco cravings and apparently has helped control his symptoms of psychogenic polydipsia in the past. ? ?Dementia: Likely related to years of schizophrenia and nonadherence to medication regimen.  Per psychiatry-does not have capacity-Per prior notes-family not interested in taking on guardianship role.  Awaiting legal guardianship evaluation and placement evaluation. ? ?Seizure disorder: Likely due to hyponatremia-last breakthrough seizure was on 2/26 in the setting of worsening hyponatremia.  Remains on Depakote.  Evaluated by neurology this admission.  EEG was negative. ? ?HLD: Continue Lipitor ? ?COPD: Not in exacerbation-continue bronchodilators. ? ?Peripheral neuropathy: Stable-continue low-dose Lyrica.  Likely related to years of substance abuse. ? ?Obesity: BMI 36, follow with PCP for weight loss. ?  ? ?Code status: ?  Code Status: Full Code  ? ?DVT Prophylaxis: ?Place and maintain sequential compression device Start: 11/01/21 1711 ?  ?Family Communication: None at bedside ? ? ?Disposition Plan: ?Status is: Inpatient ? ?Remains inpatient appropriate because: Medically stable for discharge-Long length of stay patient-awaiting guardianship hearing and placement. ? ? ?Planned Discharge Destination:Group  home ? ? ?Diet: ?Diet Order   ? ?       ?  Diet regular Room service appropriate? No;  Fluid consistency: Thin; Fluid restriction: 1200 mL Fluid  Diet effective 1000       ?  ? ?  ?  ? ?  ?  ?Antimicrobial agents: ?Anti-infectives (From admission, onward)  ? ? None  ? ?  ? ? ?MEDICATIONS: ?Scheduled Meds: ? ALPRAZolam  0.5 mg Oral BID  ? atorvastatin  40 mg Oral Daily  ? carvedilol  12.5 mg Oral BID WC  ? digoxin  0.25 mg Oral Daily  ? divalproex  500 mg Oral BID  ? furosemide  40 mg Oral Once  ? melatonin  3 mg Oral QHS  ? mirtazapine  15 mg Oral QHS  ? nicotine  14 mg Transdermal Daily  ? paliperidone  6 mg Oral Daily  ? potassium chloride  40 mEq Oral Once  ? pregabalin  25 mg Oral QHS  ? QUEtiapine  50 mg Oral QHS  ? sacubitril-valsartan  1 tablet Oral BID  ? senna-docusate  2 tablet Oral BID  ? sodium chloride  2 g Oral BID WC  ? ?Continuous Infusions: ?PRN Meds:.acetaminophen **OR** acetaminophen, bisacodyl, haloperidol lactate, hydrALAZINE, ipratropium-albuterol, LORazepam, polyethylene glycol ? ? ?I have personally reviewed following labs and imaging studies ? ?LABORATORY DATA: ? ? ?Recent Labs  ?Lab 01/02/22 ?0717  ?WBC 7.2  ?HGB 11.7*  ?HCT 36.1*  ?PLT 149*  ?MCV 69.6*  ?MCH 22.5*  ?MCHC 32.4  ?RDW 15.9*  ? ? ?Recent Labs  ?Lab 01/02/22 ?0717  ?NA 131*  ?K 3.6  ?CL 99  ?CO2 26  ?GLUCOSE 102*  ?BUN 5*  ?CREATININE 0.66  ?CALCIUM 8.5*  ?AST 17  ?ALT 12  ?ALKPHOS 61  ?BILITOT 0.6  ?ALBUMIN 3.5  ?MG 1.8  ? ? ? LOS: 96 days  ? ?Signature ? ?Susa Raring M.D on 01/03/2022 at 8:00 AM   -  To page go to [email or web address]  ? ?  ? ? ? ? ?

## 2022-01-04 DIAGNOSIS — I5022 Chronic systolic (congestive) heart failure: Secondary | ICD-10-CM | POA: Diagnosis not present

## 2022-01-04 DIAGNOSIS — E871 Hypo-osmolality and hyponatremia: Secondary | ICD-10-CM | POA: Diagnosis not present

## 2022-01-04 DIAGNOSIS — F0390 Unspecified dementia without behavioral disturbance: Secondary | ICD-10-CM | POA: Diagnosis not present

## 2022-01-04 LAB — BASIC METABOLIC PANEL
Anion gap: 5 (ref 5–15)
BUN: <5 mg/dL — ABNORMAL LOW (ref 6–20)
CO2: 27 mmol/L (ref 22–32)
Calcium: 8.7 mg/dL — ABNORMAL LOW (ref 8.9–10.3)
Chloride: 103 mmol/L (ref 98–111)
Creatinine, Ser: 0.65 mg/dL (ref 0.61–1.24)
GFR, Estimated: >60 mL/min (ref >60)
Glucose, Bld: 88 mg/dL (ref 70–99)
Potassium: 3.9 mmol/L (ref 3.5–5.1)
Sodium: 135 mmol/L (ref 135–145)

## 2022-01-04 NOTE — Progress Notes (Signed)
?  Progress Note ? ? ?Patient: Jim Dunn ZOX:[id number] DOB: 06-Nov-1965 DOA: 09/24/2021     97 ?DOS: the patient was seen and examined on 01/04/2022 at 9:33AM ?  ? ? ? ?Brief hospital course: ?Jim Dunn is a 56 y.o. M with schizophrenia, polydipsia who was brought by family for inability to care for patient. ? ?Please see prior summary 01/03/22 ? ? ? ? ?Assessment and Plan: ?* 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 ?Na normal today. ?- Continue Xanax ?- Continue sodium  ?- Continue fluid restrictions  ?- Hold Lasix ? ? ?Peripheral neuropathy ?- Continue Lyrica ? ?  ? ? ?Dementia without behavioral disturbance (HCC) ?Paranoid schizophrenia with hallucinations ?- Continue Seroquel and Invega ?- Continue mirtazapine ?- Continue Depakote ? ?Seizure disorder (HCC) ?No recent seizures.  last breakthrough seizure was on 2/26 in the setting of leaving the floor unattended, suspected free water binge and rapid drop in sodium from 120s to 116 in a few hours. ?Last Neurology consult Jan 2023 ?- Continue Depakote ? ?COPD (chronic obstructive pulmonary disease) (HCC) ? ?Mixed hyperlipidemia ?- Continue atorvastatin ? ?Chronic systolic CHF (congestive heart failure) (HCC) ?Appears euvolemic.  EF 30-35% ?- Continue salt tabs ?- Continue digoxine and Entresto and Coreg ?- Hold Lasix, had previously been only PRN ?- Continue Fluid restriction    ? ? ? ?Obesity ?BMI 36 ? ? ? ? ? ? ? ?Subjective: No complaints. ? ? ? ? ?Physical Exam: ?Vitals:  ? 01/04/22 0326 01/04/22 0439 01/04/22 1031 01/04/22 1210  ?BP: (!) 131/93  114/82 (!) 180/96  ?Pulse: 71  74 86  ?Resp: 17   16  ?Temp: 97.9 ?F (36.6 ?C)  98 ?F (36.7 ?C) (!) 97.5 ?F (36.4 ?C)  ?TempSrc:   Oral Oral  ?SpO2: 97%  98% 100%  ?Weight:  115.7 kg    ?Height:      ? ?Adult male, walking in the hall, talking on his phone.  Later sitting in the bed to the bed, eating breakfast.  RRR, no murmurs, lung sounds clear, no rales or wheezes, voice fluent, face symmetric, moves upper  extremities with normal strength and coordination, gait normal. ? ?Data Reviewed: ?Previous notes reviewed, nursing notes reviewed, vital signs reviewed ?metabolic panel normal ? ?Family Communication:   ? ? ? ?Disposition: ?Status is: Inpatient ? ? ? ? ? ? ? ? ? ? ? ?Author: ?Alberteen Sam, MD ?01/04/2022 3:02 PM ? ?For on call review [email or web address]  ? ? ?

## 2022-01-05 DIAGNOSIS — E871 Hypo-osmolality and hyponatremia: Secondary | ICD-10-CM | POA: Diagnosis not present

## 2022-01-05 DIAGNOSIS — I5022 Chronic systolic (congestive) heart failure: Secondary | ICD-10-CM | POA: Diagnosis not present
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
## 2022-01-05 NOTE — Progress Notes (Signed)
?  Progress Note ? ? ?Patient: Jim Dunn [id number] DOB: [birth year] DOA: 09/24/2021     98 ?DOS: the patient was seen and examined on 01/05/2022 at 9:33AM ?  ? ? ? ?Brief hospital course: ?[name] is a 56 y.o. M with schizophrenia, polydipsia who was brought by family for inability to care for patient. ? ?Please see prior summary 01/03/22 ? ? ? ? 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 ? ?Peripheral neuropathy ?- Continue Lyrica ? ?  ? ? ?Dementia without behavioral disturbance (HCC) ?Paranoid schizophrenia with hallucinations ?- Continue Seroquel, Invega, mirtazapine, Depakote ? ?Seizure disorder (HCC) ?See summary from 5/3 ?- Continue Depakote ? ?COPD (chronic obstructive pulmonary disease) (HCC) ? ?Mixed hyperlipidemia ?- Continue atorvastatin ? ?Chronic systolic CHF (congestive heart failure) (HCC) ?No symptoms ?- Continue salt tabs, digoxin, Entresto, Coreg ?- Hold Lasix  ?- Continue Fluid restriction    ? ? ? ?Obesity ?BMI 36 ? ? ? ? ? ? ? ?Subjective: No complaints. ? ? ? ? ?Physical Exam: ?Vitals:  ? 01/05/22 0500 01/05/22 0800 01/05/22 0834 01/05/22 1302  ?BP: 140/78  140/86 (!) 155/83  ?Pulse: 69  68 77  ?Resp: 19  19 16   ?Temp: 98.6 ?F (37 ?C)  98.5 ?F (36.9 ?C) 97.8 ?F (36.6 ?C)  ?TempSrc: Oral  Oral Oral  ?SpO2: 95% 97% 96% 100%  ?Weight: 113.2 kg     ?Height:      ? ?Adult male, no acute distress, ambulating in the hall, landing on the nurses desk, talking to nursing.  Pleasant, conversational. ? ?Data Reviewed: ?Previous notes reviewed, nursing notes reviewed, vital signs reviewed  ? ?Family Communication:   ? ? ? ?Disposition: ?Status is: Inpatient ? ? ? ?  ? ? ? ? ?Author: ? , MD ?01/05/2022 1:35 PM ? ?For on call review [email or web address]  ? ? ?

## 2022-01-06 DIAGNOSIS — I5022 Chronic systolic (congestive) heart failure: Secondary | ICD-10-CM | POA: Diagnosis not present

## 2022-01-06 DIAGNOSIS — E871 Hypo-osmolality and hyponatremia: Secondary | ICD-10-CM | POA: Diagnosis not present

## 2022-01-06 NOTE — Progress Notes (Signed)
?  Progress Note ? ? ?Patient: Jim Dunn [id number] DOB: 1966/03/14 DOA: 09/24/2021     99 ?DOS: the patient was seen and examined on 01/06/2022 at 9:33AM ?  ? ? ? ?Brief hospital course: ?[name] is a 56 y.o. M with schizophrenia, polydipsia who was brought by family for inability to care for patient. ? ?Please see prior summary 01/03/22 ? ? ? ? 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 ? ?Peripheral neuropathy ?- Continue Lyrica ? ?  ? ? ?Dementia without behavioral disturbance (HCC) ?Paranoid schizophrenia with hallucinations ?- Continue Seroquel, Invega, mirtazapine, Depakote ? ?Seizure disorder (HCC) ?See summary from 5/3 ?- Continue Depakote ? ?COPD (chronic obstructive pulmonary disease) (HCC) ? ?Mixed hyperlipidemia ?- Continue atorvastatin ? ?Chronic systolic CHF (congestive heart failure) (HCC) ?No symptoms ?- Continue salt tabs, digoxin, Entresto, Coreg ?- Hold Lasix  ?- Continue Fluid restriction    ? ? ? ?Obesity ?BMI 36 ? ? ? ? ? ? ? ?Subjective: No  complaints. ? ? ? ? ?Physical Exam: ?Vitals:  ? 01/06/22 0345 01/06/22 0500 01/06/22 0733 01/06/22 1148  ?BP: 125/65  (!) 147/77 (!) 155/73  ?Pulse: 69  (!) 59 70  ?Resp: 16  20 20   ?Temp: 98.1 ?F (36.7 ?C)  97.7 ?F (36.5 ?C) (!) 97.5 ?F (36.4 ?C)  ?TempSrc: Oral  Oral Oral  ?SpO2: 99%  99% 98%  ?Weight:  113.2 kg    ?Height:      ? ?Adult male, no acute distress, ambulating in the hall, interactive and appropriate, pleasant conversational.    ? ?Data Reviewed: ?Previous notes reviewed, nursing notes reviewed, vital signs reviewed  ? ?Family Communication:   ? ? ? ?Disposition: ?Status is: Inpatient ? ? ? ?  ? ? ? ? ?Author: ? , MD ?01/06/2022 5:16 PM ? ?For on call review [email or web address]  ? ? ?

## 2022-01-07 DIAGNOSIS — E871 Hypo-osmolality and hyponatremia: Secondary | ICD-10-CM | POA: Diagnosis not present

## 2022-01-07 DIAGNOSIS — I5022 Chronic systolic (congestive) heart failure: Secondary | ICD-10-CM | POA: Diagnosis not present

## 2022-01-07 DIAGNOSIS — F0390 Unspecified dementia without behavioral disturbance: Secondary | ICD-10-CM | POA: Diagnosis not present

## 2022-01-07 NOTE — Progress Notes (Signed)
?  Progress Note ? ? ?Patient: Jim Dunn KAJ:[id number] DOB: 20-Jul-1966 DOA: 09/24/2021     100 ?DOS: the patient was seen and examined on 01/07/2022 at 9:33AM ?  ? ? ? ?Brief hospital course: ?Mr. Banh is a 56 y.o. M with schizophrenia, polydipsia who was brought by family for inability to care for patient. ? ?Please see prior summary 01/03/22 ? ? ? ? 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 ? ?Peripheral neuropathy ?- Continue Lyrica ? ?  ? ? ?Dementia without behavioral disturbance (HCC) ?Paranoid schizophrenia with hallucinations ?- Continue Seroquel, Invega, mirtazapine, Depakote ? ?Seizure disorder (HCC) ?See summary from 5/3 ?- Continue Depakote ? ?COPD (chronic obstructive pulmonary disease) (HCC) ? ?Mixed hyperlipidemia ?- Continue atorvastatin ? ?Chronic systolic CHF (congestive heart failure) (HCC) ?No symptoms ?- Continue salt tabs, digoxin, Entresto, Coreg ?- Hold Lasix  ?- Continue Fluid restriction    ? ? ? ?Obesity ?BMI 36 ? ? ? ? ? ? ? ?Subjective: No complaints ? ? ? ? ?Physical Exam: ?Vitals:  ? 01/07/22 0456 01/07/22 0813 01/07/22 1122 01/07/22 1547  ?BP: 136/74 (!) 152/92 129/80 (!) 140/98  ?Pulse: 64 72 68 85  ?Resp: 18 18 18 18   ?Temp: 98.6 ?F (37 ?C) 98 ?F (36.7 ?C) 97.9 ?F (36.6 ?C) 99 ?F (37.2 ?C)  ?TempSrc:  Oral Oral Oral  ?SpO2: 95% 100% 100% 94%  ?Weight:      ?Height:      ? ?Adult male, lying in bed, resting comfortably, listening to music, interactive and appropriate ?RRR, no murmurs, no peripheral edema ?Lungs clear without rales or wheezes, respiratory effort normal ? ?Data Reviewed: ?Previous notes reviewed, nursing notes reviewed, vital signs reviewed  ? ?Family Communication:   ? ? ? ?Disposition: ?Status is: Inpatient ? ? ? ?  ? ? ? ? ?Author: ? , MD ?01/07/2022 4:00 PM ? ?For on call review [email or web address]  ? ? ?

## 2022-01-08 DIAGNOSIS — F0390 Unspecified dementia without behavioral disturbance: Secondary | ICD-10-CM | POA: Diagnosis not present

## 2022-01-08 DIAGNOSIS — E871 Hypo-osmolality and hyponatremia: Secondary | ICD-10-CM | POA: Diagnosis not present

## 2022-01-08 DIAGNOSIS — I5022 Chronic systolic (congestive) heart failure: Secondary | ICD-10-CM | POA: Diagnosis not present

## 2022-01-08 MED ORDER — CARVEDILOL 12.5 MG PO TABS
25.0000 mg | ORAL_TABLET | Freq: Two times a day (BID) | ORAL | Status: DC
Start: 2022-01-08 — End: 2022-04-07
  Administered 2022-01-08 – 2022-04-07 (×173): 25 mg via ORAL
  Filled 2022-01-08 (×180): qty 2

## 2022-01-08 NOTE — Progress Notes (Signed)
?  Progress Note ? ? ?PatientCorinne Bayless [id number] DOB: 1966-03-16 DOA: 09/24/2021     101 ?DOS: the patient was seen and examined on 01/08/2022 at 10:55AM ?  ? ? ? ?Brief hospital course: ?Mr. Beecham is a 56 y.o. M with schizophrenia, polydipsia who was brought by family for inability to care for patient. ? ?Please see prior summary 01/03/22 ? ? ? ? ?Assessment and Plan: ?*Chronic hyponatremia secondary to psychogenic polydipsia ?See summary from 5/3 ?- Continue Xanax, sodium tablets, fluid restrictions ?- Hold Lasix ? ? ?Peripheral neuropathy ?- Continue Lyrica ? ?  ? ? ?Dementia ?Paranoid schizophrenia with hallucinations ?- Continue Seroquel, Invega, mirtazapine, Depakote ? ?Seizure disorder ?See summary from 5/3 ?- Continue Depakote ? ?COPD (chronic obstructive pulmonary disease) ([place]) ? ?Mixed hyperlipidemia ?- Continue atorvastatin ? ?Chronic systolic CHF (congestive heart failure) ([place]) ?Asymptomatic ?- Continue salt tabs, digoxin, Entresto, Coreg ?- Hold Lasix  ?- Continue Fluid restriction    ? ? ? ?Obesity ?BMI 36 ? ? ? ? ? ? ? ?Subjective: No complaints ? ? ? ? ?Physical Exam: ?Vitals:  ? 01/08/22 0015 01/08/22 0344 01/08/22 0500 01/08/22 0800  ?BP: (!) 152/60 (!) 172/92  (!) 151/82  ?Pulse: 70 88  81  ?Resp: 18 20  20   ?Temp: 98.6 ?F (37 ?C) 97.7 ?F (36.5 ?C)  98.2 ?F (36.8 ?C)  ?TempSrc: Oral Oral    ?SpO2: 100% 97%  98%  ?Weight:   118.9 kg   ?Height:      ? ?Adult male, ambulating in the hall comfortably, interactive with staff, appropriate.    ? ?Data Reviewed: ?Previous notes reviewed, nursing notes reviewed, vital signs reviewed  ? ?  ? ? ? ?Disposition: ?Status is: Inpatient ? ? ? ?  ? ? ? ? ?Author: ?[name], MD ?01/08/2022 3:09 PM ? ?For on call review [email or web address]  ? ? ?

## 2022-01-09 DIAGNOSIS — E871 Hypo-osmolality and hyponatremia: Secondary | ICD-10-CM | POA: Diagnosis not present

## 2022-01-09 DIAGNOSIS — F0390 Unspecified dementia without behavioral disturbance: Secondary | ICD-10-CM | POA: Diagnosis not present

## 2022-01-09 DIAGNOSIS — I5022 Chronic systolic (congestive) heart failure: Secondary | ICD-10-CM | POA: Diagnosis not present

## 2022-01-09 LAB — CBC
HCT: 38.3 % — ABNORMAL LOW (ref 39.0–52.0)
Hemoglobin: 12.8 g/dL — ABNORMAL LOW (ref 13.0–17.0)
MCH: 22.9 pg — ABNORMAL LOW (ref 26.0–34.0)
MCHC: 33.4 g/dL (ref 30.0–36.0)
MCV: 68.6 fL — ABNORMAL LOW (ref 80.0–100.0)
Platelets: 145 10*3/uL — ABNORMAL LOW (ref 150–400)
RBC: 5.58 MIL/uL (ref 4.22–5.81)
RDW: 15.8 % — ABNORMAL HIGH (ref 11.5–15.5)
WBC: 6.7 10*3/uL (ref 4.0–10.5)
nRBC: 0 % (ref 0.0–0.2)

## 2022-01-09 LAB — COMPREHENSIVE METABOLIC PANEL
ALT: 13 U/L (ref 0–44)
AST: 16 U/L (ref 15–41)
Albumin: 3.6 g/dL (ref 3.5–5.0)
Alkaline Phosphatase: 54 U/L (ref 38–126)
Anion gap: 9 (ref 5–15)
BUN: 12 mg/dL (ref 6–20)
CO2: 24 mmol/L (ref 22–32)
Calcium: 8.9 mg/dL (ref 8.9–10.3)
Chloride: 99 mmol/L (ref 98–111)
Creatinine, Ser: 0.66 mg/dL (ref 0.61–1.24)
GFR, Estimated: >60 mL/min (ref >60)
Glucose, Bld: 88 mg/dL (ref 70–99)
Potassium: 4.3 mmol/L (ref 3.5–5.1)
Sodium: 132 mmol/L — ABNORMAL LOW (ref 135–145)
Total Bilirubin: 1 mg/dL (ref 0.3–1.2)
Total Protein: 6.3 g/dL — ABNORMAL LOW (ref 6.5–8.1)

## 2022-01-09 LAB — MAGNESIUM: Magnesium: 1.8 mg/dL (ref 1.7–2.4)

## 2022-01-09 NOTE — Assessment & Plan Note (Signed)
See above

## 2022-01-09 NOTE — Progress Notes (Signed)
?  Progress Note ? ? ?PatientVaun Dunn [id number]:[id number] DOB: 11/11/1965 DOA: 09/24/2021     102 ?DOS: the patient was seen and examined on 01/09/2022 at 10:29 AM ?  ? ? ? ?Brief hospital course: ?Jim Dunn is a 56 y.o. M with schizophrenia, polydipsia who was brought by family for inability to care for patient. ? ?Please see prior summary 01/03/22 ? ? ? ? ?Assessment and Plan: ?*Chronic hyponatremia ?- Continue Xanax, sodium tablets, fluid restriction ?- Hold Lasix ? ?Peripheral neuropathy ?- Continue Lyrica ? ?Dementia ?Paranoid schizophrenia with hallucinations ?Disease well-controlled on for long time ?- Continue Seroquel, Invega, mirtazapine, Depakote ? ?Seizure disorder ?- Continue Depakote ? ?Hyperlipidemia ?- Continue atorvastatin ? ?Chronic systolic CHF ?Asymptomatic ?- Continue digoxin, Entresto, Coreg, salt tabs ?- Hold Lasix ?- Continue fluid restriction ? ? ? ? ? ?Subjective: Feeling well, reading the Bible, no fever, respiratory distress, swelling. ? ? ? ? ?Physical Exam: ?Vitals:  ? 01/09/22 0500 01/09/22 0830 01/09/22 1136 01/09/22 1200  ?BP:  (!) 123/92 (!) 154/98   ?Pulse:  72 77   ?Resp:  17 16   ?Temp:  99 ?F (37.2 ?C)    ?TempSrc:  Oral    ?SpO2:  97% 99% 99%  ?Weight: 117.5 kg     ?Height:      ? ?Adult male, ambulating in the hall comfortably, sitting on his bed later, interactive, face symmetric, speech fluent, oriented to person, place, and time, remembers my name, without swelling in the peripheral extremities. ? ?Data Reviewed: ?Vital signs reviewed ? ?  ? ? ? ?Disposition: ?Status is: Inpatient ? ? ? ?  ? ? ? ? ?Author: ?Alberteen Sam, MD ?01/09/2022 3:32 PM ? ?For on call review [email or web address]  ? ? ?

## 2022-01-10 DIAGNOSIS — E871 Hypo-osmolality and hyponatremia: Secondary | ICD-10-CM | POA: Diagnosis not present

## 2022-01-10 NOTE — Progress Notes (Signed)
?Progress Note ? ? ?PatientNoel Dunn LYY:[id number] DOB: 08/20/1966 DOA: 09/24/2021     103 ?DOS: the patient was seen and examined on 01/10/2022 at 1046AM ?  ? ? ? ?Brief hospital course: ?Jim Dunn is a 56 y.o. M with schizophrenia, polydipsia who was brought by family for inability to care for patient. ? ?Please see prior summary 01/03/22 ? ? ? ? 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  Counseled again on total fluid restriction, staff again updated on 01/02/2022, gentle 1 time lasix with KDur on 01/03/22, check BMP in am again. ? ?- Continue Xanax ?- Continue sodium  ?- Continue fluid restrictions  ?- Hold Lasix ? ?Hyponatremia ?See above ? ?Peripheral neuropathy ?Likely related to years of substance abuse. ?- Continue Lyrica ? ?Dementia without behavioral disturbance (HCC) ?During initial admission over 1 year ago had formal neuropsychiatric evaluation by Dr. Kieth Brightly.  Refer to that evaluation on 05/27/20 in which patient had scores in the severely impaired/extremely low range across all indices in the RBANS B neuropsychological test battery.   ? ?His diagnosis was major neurocognitive disorder (nonprogressive dementia) and post-traumatic stress disorder. ? ?Paranoid schizophrenia with hallucinations ?Relatively stable-continue Seroquel, Invega and Xanax.  Per prior notes-plan is to continue Xanax as this helps with tobacco cravings and apparently has helped control his symptoms of psychogenic polydipsia in the past. ?Off unit privileges accompanied by staff only ?- Continue Seroquel and Invega ?- Continue mirtazapine ?- Continue Depakote ? ?Seizure disorder (HCC) ?No recent seizures.  last breakthrough seizure was on 2/26 in the setting of leaving the floor unattended, suspected free water binge and rapid drop in sodium from 120s to 116 in a few  hours. ?Last Neurology consult Jan 2023 ?- Continue Depakote ? ?COPD (chronic obstructive pulmonary disease) (HCC) ?Compensated ?Given Pneumovax immunization this admission ? ?Mixed hyperlipidemia ?- Continue atorvastatin ? ?Chronic systolic CHF (congestive heart failure) (HCC) ?Continue to monitor sodium levels periodically-continue to counsel regarding importance of fluid restriction ?Last Lasix: gentle 1 time lasix with KDur on 01/03/22 ?Daily Lasix discontinued in context of recurrent hyponatremia. But continued as needed ? ?Most recent echocardiogram performed on 09/26/2021 EF of 30-35% with moderately decreased systolic function and global hypokinesis.   ?- Continue digoxine and Entresto and Coreg ?- Hold Lasix, had previously been only PRN ?- Continue Fluid restriction    ?- Remains on salt tabs ?- Needs periodic evaluation of swelling and consideration of repeat Lasix ? ? ?  ? ? ? ? ? ? ? ? ?Subjective: Patient feels well, no new complaints, no swelling, orthopnea, dyspnea on exertion. ? ? ? ? ?Physical Exam: ?Vitals:  ? 01/09/22 2201 01/10/22 0432 01/10/22 0753 01/10/22 1122  ?BP: (!) 140/109 126/73 (!) 158/82 (!) 177/92  ?Pulse:  67 71 65  ?Resp:  16 20 20   ?Temp:  98.2 ?F (36.8 ?C) 97.8 ?F (36.6 ?C) (!) 97.5 ?F (36.4 ?C)  ?TempSrc:  Oral Oral Oral  ?SpO2:  100% 99% 100%  ?Weight:  115.6 kg    ?Height:      ? ?Adult male, interactive, pleasant, lying in bed, looking at his phone ?Face symmetric, speech fluent, affect blunted, moves all 4 extremities with normal strength and coordination, respiratory effort normal, no suspicious rashes or lesions ? ?Data Reviewed: ?Nursing notes reviewed, vital signs reviewed ? ?  ? ? ? ?Disposition: ?Status is: Inpatient ? ?Remains inpatient appropriate  because: he was admitted for hyponatremia.  He does not have safe disposition and we await action by the state to appoint a guardian. ? ? ? ? ? ? ? ? ? ?Author: ?Alberteen Sam, MD ?01/10/2022 1:43 PM ? ?For on call  review [email or web address]  ? ? ?

## 2022-01-10 NOTE — Progress Notes (Signed)
CSW spoke with patient to discuss his questions regarding upcoming guardianship hearing. All questions were answered. Patient was pleasant, engaged, and interactive during conversation. ? ?Madilyn Fireman, MSW, LCSW ?Transitions of Care  Clinical Social Worker II ?[phone] ? ?

## 2022-01-10 NOTE — Progress Notes (Addendum)
This chaplain responded to the Pt. request for a prayer of renewal outside his room. The unit suggested a journal for writing and the Pt. accepted the journal from the chaplain. ? ?This chaplain is available for F/U spiritual care as needed. ? ?Chaplain Sallyanne Kuster ?[phone] ?

## 2022-01-11 DIAGNOSIS — E871 Hypo-osmolality and hyponatremia: Secondary | ICD-10-CM | POA: Diagnosis not present
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
## 2022-01-12 DIAGNOSIS — E871 Hypo-osmolality and hyponatremia: Secondary | ICD-10-CM | POA: Diagnosis not present

## 2022-01-12 MED ORDER — AMLODIPINE BESYLATE 10 MG PO TABS
10.0000 mg | ORAL_TABLET | Freq: Every day | ORAL | Status: DC
  Administered 2022-01-12 – 2022-04-07 (×86): 10 mg via ORAL
  Filled 2022-01-12 (×86): qty 1

## 2022-01-12 NOTE — TOC Progression Note (Signed)
Transition of Care (TOC) - Progression Note  ? ? ?Patient Details  ?Name: Jim Dunn ?MRN: [id number] ?Date of Birth: 04/19/1966 ? ?Transition of Care (TOC) CM/SW Contact  ?Curlene Labrum, RN ?Phone Number: ?01/12/2022, 11:32 AM ? ?Clinical Narrative:    ?Incompetency Letter was completed and sent to [place] at financial counseling by Dr. Loleta Books.  CM and MSW with DTP Team will continue to follow the patient for TOC needs. ? ?Expected Discharge Plan: Home/Self Care ?Barriers to Discharge: Other (must enter comment), Continued Medical Work up (family declining to continue to care for pt) ? ?Expected Discharge Plan and Services ?Expected Discharge Plan: Home/Self Care ?In-house Referral: Clinical Social Work ?  ?Post Acute Care Choice:  (TBD) ?Living arrangements for the past 2 months: [place] ?                ?  ?  ?  ?  ?  ?  ?  ?  ?  ?  ? ? ?Social Determinants of Health (SDOH) Interventions ?  ? ?Readmission Risk Interventions ? ?  01/23/2020  ?  4:46 PM  ?Readmission Risk Prevention Plan  ?Transportation Screening Complete  ?[place] or Home Care Consult Complete  ?Social Work Consult for [place] Planning/Counseling Complete  ?Palliative Care Screening Not Applicable  ? ? ?

## 2022-01-12 NOTE — Progress Notes (Signed)
This chaplain is present with the Pt. for F/U spiritual care.  ? ?The chaplain paused outside the Pt. room and listened reflectively as the Pt. spoke of his faith and God's guiding love. The chaplain affirmed the Pt. trust and courage to speak to others about his faith. ? ?This chaplain is available to F/U with spiriutal care as needed. ? ?Chaplain Stephanie Acre ?[phone] ?
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
## 2022-01-13 DIAGNOSIS — E871 Hypo-osmolality and hyponatremia: Secondary | ICD-10-CM | POA: Diagnosis not present
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
## 2022-01-14 DIAGNOSIS — E871 Hypo-osmolality and hyponatremia: Secondary | ICD-10-CM | POA: Diagnosis not present

## 2022-01-14 MED ORDER — NICOTINE POLACRILEX 2 MG MT GUM
2.0000 mg | CHEWING_GUM | OROMUCOSAL | Status: DC | PRN
  Administered 2022-01-14 – 2022-01-20 (×3): 2 mg via ORAL
  Filled 2022-01-14 (×5): qty 1

## 2022-01-14 MED ORDER — NICOTINE 7 MG/24HR TD PT24
7.0000 mg | MEDICATED_PATCH | Freq: Every day | TRANSDERMAL | Status: DC
  Administered 2022-01-14: 7 mg via TRANSDERMAL
  Filled 2022-01-14: qty 1
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
## 2022-01-15 DIAGNOSIS — E871 Hypo-osmolality and hyponatremia: Secondary | ICD-10-CM | POA: Diagnosis not present
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
## 2022-01-16 LAB — COMPREHENSIVE METABOLIC PANEL
ALT: 14 U/L (ref 0–44)
AST: 20 U/L (ref 15–41)
Albumin: 3.7 g/dL (ref 3.5–5.0)
Alkaline Phosphatase: 51 U/L (ref 38–126)
Anion gap: 8 (ref 5–15)
BUN: 12 mg/dL (ref 6–20)
CO2: 23 mmol/L (ref 22–32)
Calcium: 8.8 mg/dL — ABNORMAL LOW (ref 8.9–10.3)
Chloride: 102 mmol/L (ref 98–111)
Creatinine, Ser: 0.67 mg/dL (ref 0.61–1.24)
GFR, Estimated: >60 mL/min (ref >60)
Glucose, Bld: 94 mg/dL (ref 70–99)
Potassium: 4.4 mmol/L (ref 3.5–5.1)
Sodium: 133 mmol/L — ABNORMAL LOW (ref 135–145)
Total Bilirubin: 0.6 mg/dL (ref 0.3–1.2)
Total Protein: 6.2 g/dL — ABNORMAL LOW (ref 6.5–8.1)

## 2022-01-16 LAB — DIGOXIN LEVEL: Digoxin Level: 0.9 ng/mL (ref 0.8–2.0)

## 2022-01-16 NOTE — Progress Notes (Signed)
CSW updated MD note to Marlene Lard of [facility] legal team for review. ? ?[name], MSW, LCSW ?Transitions of Care  Clinical Social Worker II ?[phone] ? ?

## 2022-01-16 NOTE — TOC Progression Note (Signed)
Transition of Care (TOC) - Progression Note  ? ? ?Patient Details  ?Name: Jim Dunn ?MRN: [id number] ?Date of Birth: August 13, 1966 ? ?Transition of Care (TOC) CM/SW Contact  ?Curlene Labrum, RN ?Phone Number: ?01/16/2022, 2:03 PM ? ?Clinical Narrative:    ?Patient with pending guardianship court date scheduled for 01/25/2022.  CM called and left a message with Earlean Polka, CM with [facility] to continue search for available placement options for the patient.  Patient will need Group Home versus ALF for placement since the family is unable to provide care at their home. ? ? ?Expected Discharge Plan: Home/Self Care ?Barriers to Discharge: Other (must enter comment), Continued Medical Work up (family declining to continue to care for pt) ? ?Expected Discharge Plan and Services ?Expected Discharge Plan: Home/Self Care ?In-house Referral: Clinical Social Work ?  ?Post Acute Care Choice:  (TBD) ?Living arrangements for the past 2 months: [place] ?                ?  ?  ?  ?  ?  ?  ?  ?  ?  ?  ? ? ?Social Determinants of Health (SDOH) Interventions ?  ? ?Readmission Risk Interventions ? ?  01/23/2020  ?  4:46 PM  ?Readmission Risk Prevention Plan  ?Transportation Screening Complete  ?[place] or Home Care Consult Complete  ?Social Work Consult for Talent Planning/Counseling Complete  ?Palliative Care Screening Not Applicable  ? ? ?
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
## 2022-01-17 DIAGNOSIS — E871 Hypo-osmolality and hyponatremia: Secondary | ICD-10-CM | POA: Diagnosis not present

## 2022-01-17 NOTE — Progress Notes (Signed)
CSW spoke with patient's sister Archie Patten to discuss patient's request for a haircut prior to his court hearing on 5/24. Archie Patten is agreeable to provide patient with funds for a haircut if that service is available to him. Archie Patten will participate in the court hearing next week via [employer]. ? ?[name], MSW, LCSW ?Transitions of Care  Clinical Social Worker II ?[phone] ? ? ? ?
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
## 2022-01-17 NOTE — TOC Progression Note (Addendum)
Transition of Care (TOC) - Progression Note  ? ? ?Patient Details  ?Name: Delois Tolbert ?MRN: [id number] ?Date of Birth: 24-Aug-1966 ? ?Transition of Care (TOC) CM/SW Contact  ?Janae Bridgeman, RN ?Phone Number: ?01/17/2022, 8:02 AM ? ?Clinical Narrative:    ?CM called and spoke with Trenton Gammon, CM with [facility] and she has male bed availability at this time and is agreeable to review the patient's clinicals.  Updated FL2, recent medical progress notes and demographics were send to her secure email for review at [email or web address]. ? ?Pending guardianship court date is scheduled for 01/25/2022 through [facility] court system. ? ? ?Expected Discharge Plan: Home/Self Care ?Barriers to Discharge: Other (must enter comment), Continued Medical Work up (family declining to continue to care for pt) ? ?Expected Discharge Plan and Services ?Expected Discharge Plan: Home/Self Care ?In-house Referral: Clinical Social Work ?  ?Post Acute Care Choice:  (TBD) ?Living arrangements for the past 2 months: Single Family Home ?                ?  ?  ?  ?  ?  ?  ?  ?  ?  ?  ? ? ?Social Determinants of Health (SDOH) Interventions ?  ? ?Readmission Risk Interventions ? ?  01/23/2020  ?  4:46 PM  ?Readmission Risk Prevention Plan  ?Transportation Screening Complete  ?HRI or Home Care Consult Complete  ?Social Work Consult for Recovery Care Planning/Counseling Complete  ?Palliative Care Screening Not Applicable  ? ? ?

## 2022-01-17 NOTE — [place] FL2 (Signed)
?[place] MEDICAID FL2 LEVEL OF CARE SCREENING TOOL  ?  ? ?IDENTIFICATION  ?Patient Name: ?Jim Dunn Birthdate: 08-04-1966 Sex: male Admission Date (Current Location): ?09/24/2021  ?[place] and [place] Number: ? [place] ?Pending Medicaid application Facility and Address:  ?[facility], [place] [address] [place], [place] [address] ?     Provider Number: ?[id number]  ?Attending Physician Name and Address:  ?Cherene Altes, MD ? Relative Name and Phone Number:  ?Fenton Malling (mother) - [phone] ?   ?Current Level of Care: ?Hospital Recommended Level of Care: ?[place], [place] Prior Approval Number: ?  ? ?Date Approved/Denied: ?  PASRR Number: ?  ? ?Discharge Plan: ?Other (Comment) (ALF, Group Home or [place]) ?  ? ?Current Diagnoses: ?Patient Active Problem List  ? Diagnosis Date Noted  ? Hyponatremia   ? Peripheral neuropathy 10/20/2021  ? Dementia without behavioral disturbance ([place]) 09/25/2021  ? Needs assistance with community resources 09/12/2021  ? Disorganized schizophrenia ([place])   ? Dementia ([place]) 06/08/2020  ? SIADH (syndrome of inappropriate ADH production) ([place]) 06/08/2020  ? Chronic post-traumatic stress disorder (PTSD)   ? Noncompliance with medications   ? Subtherapeutic serum dilantin level   ? Seizure ([place])   ? Hypomagnesemia 02/23/2020  ? Suicidal ideation 02/23/2020  ? Paranoid schizophrenia with hallucinations   ? Hypokalemia   ? Chronic systolic CHF (congestive heart failure) ([place]) 01/22/2020  ? Mixed hyperlipidemia 01/22/2020  ? COPD (chronic obstructive pulmonary disease) ([place]) 01/22/2020  ? Microcytic anemia 01/22/2020  ? 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 Seizure disorder ([place]) 01/22/2020  ? ? ?Orientation RESPIRATION BLADDER Height & Weight   ?  ?Self, Time, Situation, Place ? Normal Continent Weight: 116.7 kg ?Height:  5\' 8"  (172.7 cm)  ?BEHAVIORAL SYMPTOMS/MOOD NEUROLOGICAL BOWEL NUTRITION STATUS  ?    (Psychogenic Polydypsia) Continent Diet  ?AMBULATORY STATUS COMMUNICATION OF NEEDS Skin   ?Independent Verbally Normal ?  ?  ?  ?    ?     ?     ? ? ?Personal Care Assistance Level of Assistance  ?Bathing, Feeding, Dressing Bathing Assistance: Independent ?Feeding assistance: Independent ?Dressing Assistance: Independent ?   ? ?Functional Limitations Info  ?Sight, Hearing, Speech Sight Info: Adequate ?Hearing Info: Adequate ?Speech Info: Adequate  ? ? ?SPECIAL CARE FACTORS FREQUENCY  ?    ?  ?  ?  ?  ?  ?  ?   ? ? ?Contractures Contractures Info: Not present  ? ? ?Additional Factors Info  ?Code Status, Allergies Code Status Info: Full code ?Allergies Info: NKDA ?Psychotropic Info: Xanex, Depakote, Remoeron, Invega, Lyrica, Seroquel ?  ?  ?   ? ?Current Medications (01/17/2022):  This is the current hospital active medication list ?Current Facility-Administered Medications  ?Medication Dose Route Frequency Provider Last Rate Last Admin  ? acetaminophen (TYLENOL) tablet 650 mg  650 mg Oral Q6H PRN Juanito Doom, MD   650 mg at 01/15/22 2122  ? ALPRAZolam Duanne Moron) tablet 0.5 mg  0.5 mg Oral BID Samella Parr, NP   0.5 mg at 01/16/22 2204  ? amLODipine (NORVASC) tablet 10 mg  10 mg Oral Daily Joette Catching T, MD   10 mg at 01/16/22 1027  ? atorvastatin (LIPITOR) tablet 40 mg  40 mg Oral Daily Simonne Maffucci B, MD   40 mg at 01/16/22 1027  ? bisacodyl (DULCOLAX) suppository 10 mg  10 mg Rectal Daily PRN Jonetta Osgood, MD  10 mg at 12/21/21 1613  ? carvedilol (COREG) tablet 25 mg  25 mg Oral BID WC Danford, Suann Larry, MD   25 mg at 01/16/22 1654  ? digoxin (LANOXIN) tablet 0.25 mg  0.25 mg Oral Daily Simonne Maffucci B, MD   0.25 mg at 01/16/22 1027  ? divalproex (DEPAKOTE ER) 24 hr tablet 500 mg  500 mg Oral BID Simonne Maffucci B, MD   500 mg at 01/16/22 2204  ? LORazepam (ATIVAN) injection 2 mg  2 mg Intravenous Q5 Min x 2 PRN Simonne Maffucci B, MD      ? melatonin tablet 3 mg  3 mg Oral QHS  Simonne Maffucci B, MD   3 mg at 01/16/22 2204  ? mirtazapine (REMERON) tablet 15 mg  15 mg Oral QHS Simonne Maffucci B, MD   15 mg at 01/16/22 2205  ? nicotine polacrilex (NICORETTE) gum 2 mg  2 mg Oral PRN Cherene Altes, MD   2 mg at 01/15/22 2123  ? paliperidone (INVEGA) 24 hr tablet 6 mg  6 mg Oral Daily Samella Parr, NP   6 mg at 01/16/22 1027  ? polyethylene glycol (MIRALAX / GLYCOLAX) packet 17 g  17 g Oral Daily PRN Terrilee Croak, MD   17 g at 12/21/21 1038  ? pregabalin (LYRICA) capsule 25 mg  25 mg Oral QHS Samella Parr, NP   25 mg at 01/16/22 2205  ? QUEtiapine (SEROQUEL) tablet 50 mg  50 mg Oral QHS Simonne Maffucci B, MD   50 mg at 01/16/22 2204  ? sacubitril-valsartan (ENTRESTO) 24-26 mg per tablet  1 tablet Oral BID Juanito Doom, MD   1 tablet at 01/16/22 2204  ? senna-docusate (Senokot-S) tablet 2 tablet  2 tablet Oral BID Simonne Maffucci B, MD   2 tablet at 01/16/22 2204  ? sodium chloride tablet 2 g  2 g Oral BID WC Juanito Doom, MD   2 g at 01/16/22 1654  ? ? ? ?Discharge Medications: ?Please see discharge summary for a list of discharge medications. ? ?Relevant Imaging Results: ? ?Relevant Lab Results: ? ? ?Additional Information ?SSN: [id number], current smoker ? ?Curlene Labrum, RN ? ? ? ? ?

## 2022-01-18 DIAGNOSIS — I5022 Chronic systolic (congestive) heart failure: Secondary | ICD-10-CM | POA: Diagnosis not present

## 2022-01-18 DIAGNOSIS — E871 Hypo-osmolality and hyponatremia: Secondary | ICD-10-CM | POA: Diagnosis not present

## 2022-01-18 DIAGNOSIS — J431 Panlobular emphysema: Secondary | ICD-10-CM | POA: Diagnosis not present

## 2022-01-18 DIAGNOSIS — F0390 Unspecified dementia without behavioral disturbance: Secondary | ICD-10-CM | POA: Diagnosis not present

## 2022-01-18 NOTE — Progress Notes (Signed)
?  Progress Note ? ? ?PatientHideo Dunn CHE:[id number] DOB: 03-27-1966 DOA: 09/24/2021     111 ?DOS: the patient was seen and examined on 01/18/2022 at 10:04AM ?  ? ? ? ?Brief hospital course: ?[name] is a 56 y.o. M with schizophrenia, polydipsia who was brought by family for inability to care for patient. ? ?Please see prior summary 01/03/22 ? ? ? ? 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  ? ?Peripheral neuropathy ?-Continue Lyrica ? ?Dementia without behavioral disturbance (HCC) ?-Obtain QuantiFERON gold to assist with facilitating placement ? ?Paranoid schizophrenia with hallucinations ?See summary from 5/9 ?- Continue Seroquel, Invega, mirtazapine, Depakote ? ?Seizure disorder (HCC) ?See summary from 5/9 ?- Continue Depakote ? ?Mixed hyperlipidemia ?- Continue Lipitor ? ?Chronic systolic CHF (congestive heart failure) (HCC) ?See summary from 5/9 ?- Continue digoxin, Entresto, Coreg ?- Hold Lasix ?- Continue fluid restrictions  ? ? ? ? ? ? ? ? ?Subjective: Feels well, no new dyspnea, chest pain, swelling, dyspnea on exertion ? ? ? ?Physical Exam: ?Vitals:  ? 01/18/22 0351 01/18/22 0801 01/18/22 1213 01/18/22 1534  ?BP: 122/81 127/84 (!) 145/85 129/86  ?Pulse: 64 67 68 86  ?Resp: 20 18 18 18   ?Temp: (!) 97.4 ?F (36.3 ?C) 97.7 ?F (36.5 ?C) 97.8 ?F (36.6 ?C) 98.4 ?F (36.9 ?C)  ?TempSrc: Oral Oral Oral Oral  ?SpO2: 97% 97% 99% 97%  ?Weight:      ?Height:      ? ?Adult male, pleasant, ambulating in the hall, interactive and appropriate, no swelling, attention normal, affect pleasant, judgment and insight appear mildly impaired ? ?Data Reviewed: ?Nursing notes reviewed, vital signs reviewed ? ?  ? ? ? ?Disposition: ?Status is: Inpatient ? ?Remains inpatient appropriate because: he was admitted for hyponatremia.  He does not have safe disposition and we await action by the state to appoint a  guardian. ? ?  ? ? ? ?Author: ? , MD ?01/18/2022 5:46 PM ? ?For on call review [email or web address].  ? ? ?
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
## 2022-01-18 NOTE — Progress Notes (Signed)
CSW left message with Doristine Mango in [place] to determine bed availability - no answer, a voicemail was left requesting a return call. ? ?[name], MSW, LCSW ?Transitions of Care  Clinical Social Worker II ?[phone] ? ?
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
## 2022-01-19 DIAGNOSIS — E871 Hypo-osmolality and hyponatremia: Secondary | ICD-10-CM | POA: Diagnosis not present

## 2022-01-19 DIAGNOSIS — J431 Panlobular emphysema: Secondary | ICD-10-CM | POA: Diagnosis not present

## 2022-01-19 DIAGNOSIS — I5022 Chronic systolic (congestive) heart failure: Secondary | ICD-10-CM | POA: Diagnosis not present

## 2022-01-19 NOTE — Progress Notes (Signed)
  Progress Note   Patient: Jim Dunn [id number] DOB: 1966-07-16 DOA: 09/24/2021     112 DOS: the patient was seen and examined on 01/19/2022 at 10:04AM      Brief hospital course: Mr. Varano is a 56 y.o. M with schizophrenia, polydipsia who was brought by family for inability to care for patient.  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 See summary from 5/9 - Continue Xanax, sodium - Continue fluid restrictions, hold Lasix     Peripheral neuropathy Continue Lyrica  Dementia without behavioral disturbance ([place]) -Obtain QuantiFERON gold to assist with facilitating placement  Paranoid schizophrenia with hallucinations See summary from 5/9 - Continue Seroquel, Invega, mirtazapine, Depakote  Seizure disorder ([place]) See summary from 5/9 - Continue Depakote  Mixed hyperlipidemia - Continue Lipitor  Chronic systolic CHF (congestive heart failure) ([place]) See summary from 5/9 - Continue digoxin, Entresto, Coreg - Hold Lasix - Continue fluid restrictions          Subjective: No new complaints, no swelling, dyspnea, pain   Physical Exam: Vitals:   01/19/22 0747 01/19/22 1017 01/19/22 1300 01/19/22 1552  BP: 112/79  (!) 146/75 (!) 132/95  Pulse: 71 82 74 65  Resp: 17     Temp: 98 F (36.7 C)  97.7 F (36.5 C) 97.6 F (36.4 C)  TempSrc: Oral  Oral Oral  SpO2: 99%  99% 100%  Weight:      Height:       Adult male, sitting in bed, interactive, appropriate RRR, no murmurs, no peripheral edema Respiratory rate normal, lungs clear without rales or wheezes  Data Reviewed: Nursing notes reviewed, vital signs reviewed       Disposition: Status is: Inpatient  Remains inpatient appropriate because: he was admitted for hyponatremia.  He does not have safe disposition and we await action by the state to appoint a guardian.       Author: [name], MD 01/19/2022 3:57 PM  For  on call review [email or web address].

## 2022-01-19 NOTE — TOC Progression Note (Addendum)
Transition of Care [facility]) - Progression Note    Patient Details  Name: Jim Dunn MRN: [id number] Date of Birth: 22-Oct-1965  Transition of Care [facility] CM/SW Contact  Curlene Labrum, RN Phone Number: 01/19/2022, 9:54 AM  Clinical Narrative:    CM called and spoke with [name], owner of [facility] this morning and she has confirmed availability for placement for the patient at this time.  Maretta Bees states that she spoke with the patient's sister yesterday and the family is in agreement to accept the bed offer at the facility and are able to sign the patient in for admission to the facility and provide payment for room and board at the facility.  I spoke with Madilyn Fireman, MSW and she has contact the hospital attorney to determine if the patient can discharge to the facility prior to the pending court date - to be determined.  I spoke with Dr. Loleta Books, attending physician and will follow up him regarding possible admission date to the facility once the hospital attorney can determine.    [facility] has offered to accept the patient for admission at any time - if court approved.  The patient will need discharge medications filled through [place] for 7 days and have written copies of the discharge prescriptions so the facility can fill the medications through the mail order pharmacy that the facility uses.  Dr. Loleta Books is aware.  The patient's family will likely need to transport the patient to the facility once a discharge date can be determined.  TB Gold labwork is pending at this time and results will be provided to the facility once results are available in the next 5 business days. Last CXR does not show s/s of TB as confirmed by the attending physician.  01/19/22  1448 - CM called and spoke with [name], owner of [facility] and she is aware that Shon Baton, MSW is waiting to hear back from [place], IT sales professional and guardian ad litem at [facility] courts to see if  patient can discharge to the facility prior to his scheduled court date.  The patient will be unable to discharge to the ALF today.  The patient and facility are both aware.  CM and MSW with DTP Team will continue to follow the patient for discharge planning to [facility] - discharge date pending and undetermined at this time.   Expected Discharge Plan: Assisted Living Barriers to Discharge:  (Pending TB Gold Test, Guardianship date with [place] - on 5/24)  Expected Discharge Plan and Services Expected Discharge Plan: Assisted Living In-house Referral: Clinical Social Work, Development worker, community (Guardianship hearing with [place], [place] application pending)   Post Acute Care Choice:  (Firm bed offer with [place], [place], [place] [address]) Living arrangements for the past 2 months: [place] Determinants of Health (SDOH) Interventions    Readmission Risk Interventions    01/18/2022    2:30 PM 01/23/2020    4:46 PM  Readmission Risk Prevention Plan  Transportation Screening Complete Complete  HRI or Home Care Consult  Complete  Social Work Consult for [place] Planning/Counseling  Complete  Palliative Care Screening  Not Applicable  Medication Review Press photographer) Complete   PCP  or Specialist appointment within 3-5 days of discharge Complete   HRI or [place] Complete   SW Recovery Care/Counseling Consult Complete   [place] Not Applicable

## 2022-01-19 NOTE — Progress Notes (Signed)
CSW spoke with Marlene Lard of [facility] legal team to discuss tentative discharge plan. Rennis Harding to speak with patient's GAL to determine if discharge to [facility] can occur prior to the scheduled court hearing. Rennis Harding to return call to CSW.  [name], MSW, LCSW Transitions of Care  Clinical Social Worker II [phone]

## 2022-01-20 DIAGNOSIS — E871 Hypo-osmolality and hyponatremia: Secondary | ICD-10-CM | POA: Diagnosis not present

## 2022-01-20 NOTE — Progress Notes (Signed)
CSW attempted to reach patient's Guardian ad Litem twice without success.  CSW spoke with Marlene Lard of [facility] legal team who states he has attempted to reach GAL also without success.  [name], MSW, LCSW Transitions of Care  Clinical Social Worker II [phone]

## 2022-01-20 NOTE — Progress Notes (Signed)
  Progress Note   Patient: Jim Dunn [id number] DOB: [birth year] DOA: 09/24/2021     113 DOS: the patient was seen and examined on 01/20/2022 at 10:04AM      Brief hospital course: Mr. Mccrery is a 56 y.o. M with schizophrenia, polydipsia who was brought by family for inability to care for patient.  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 See summary from 5/9 - Continue Xanax, sodium - Continue fluid restrictions, hold Lasix     Peripheral neuropathy Continue Lyrica  Dementia without behavioral disturbance ([place]) -Obtain QuantiFERON gold to assist with facilitating placement  Paranoid schizophrenia with hallucinations See summary from 5/9 - Continue Seroquel, Invega, mirtazapine, Depakote  Seizure disorder ([place]) See summary from 5/9 - Continue Depakote  Mixed hyperlipidemia - Continue Lipitor  Chronic systolic CHF (congestive heart failure) ([place]) See summary from 5/9 - Continue digoxin, Entresto, Coreg - Hold Lasix - Continue fluid restrictions          Subjective: Patient is feeling well, he has no new complaints.   Physical Exam: Vitals:   01/19/22 2337 01/20/22 0326 01/20/22 0800 01/20/22 1250  BP: 123/74 107/65 123/72 133/78  Pulse: 77 67 75   Resp:  19 20 18   Temp: 98.5 F (36.9 C) 98 F (36.7 C) 98.1 F (36.7 C) 98.2 F (36.8 C)  TempSrc: Oral Oral Oral Oral  SpO2: 97% 100% 100% 99%  Weight:      Height:       Adult male, ambulating in the hall, edentulous, pleasant. Gait normal, attention and speech normal.  Data Reviewed: Nursing notes reviewed, vital signs reviewed       Disposition: Status is: Inpatient  Remains inpatient appropriate because: he was admitted for hyponatremia.  He does not have safe disposition and we await action by the state to appoint a guardian.       Author: [name], MD 01/20/2022 2:36 PM  For on call review  [email or web address].

## 2022-01-21 DIAGNOSIS — E871 Hypo-osmolality and hyponatremia: Secondary | ICD-10-CM | POA: Diagnosis not present

## 2022-01-21 NOTE — Progress Notes (Signed)
  Progress Note   Patient: Jim Dunn [id number] DOB: 10-12-1965 DOA: 09/24/2021     114 DOS: the patient was seen and examined on 01/21/2022        Brief hospital course: Jim Dunn is a 56 y.o. M with schizophrenia, polydipsia who was brought by family for inability to care for patient.  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 See summary from 5/9 - Continue Xanax, salt tabs - Continue fluid restrictions, hold Lasix     Peripheral neuropathy -Continue Lyrica  Dementia without behavioral disturbance (HCC) -Obtain QuantiFERON gold to assist with facilitating placement  Paranoid schizophrenia with hallucinations See summary from 5/9 - Continue Seroquel, Invega, mirtazapine, Depakote  Seizure disorder (HCC) See summary from 5/9 - Continue Depakote  Mixed hyperlipidemia - Continue Lipitor  Chronic systolic CHF (congestive heart failure) (HCC) See summary from 5/9 - Continue digoxin, Entresto, Coreg - Hold Lasix - Continue fluid restrictions          Subjective: No new complaints.   Physical Exam: Vitals:   01/20/22 2325 01/21/22 0331 01/21/22 0808 01/21/22 1348  BP: 132/87 105/77 116/81 121/71  Pulse: 69 72 68 84  Resp: 18 18 19 19   Temp: 97.7 F (36.5 C) 98.5 F (36.9 C) 98.5 F (36.9 C) 98.4 F (36.9 C)  TempSrc: Oral Oral Oral Oral  SpO2: 98% 93% 98% 97%  Weight:      Height:       Adult male, sitting in chair, interactive, appropriate, gait normal, face symmetric, speech fluent, edentulous  Data Reviewed: Nursing notes reviewed, vital signs reviewed       Disposition: Status is: Inpatient  Remains inpatient appropriate because: he was admitted for hyponatremia.  He does not have safe disposition and we await action by the state to appoint a guardian.       Author: , MD 01/21/2022 2:43 PM  For on call review [email or web address].

## 2022-01-22 DIAGNOSIS — E871 Hypo-osmolality and hyponatremia: Secondary | ICD-10-CM | POA: Diagnosis not present

## 2022-01-22 NOTE — Progress Notes (Signed)
  Progress Note   Patient: Jim Dunn [id number] DOB: 12/30/1965 DOA: 09/24/2021     115 DOS: the patient was seen and examined on 01/22/2022        Brief hospital course: [name] is a 56 y.o. M with schizophrenia, polydipsia who was brought by family for inability to care for patient.        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 See summary from 5/9 - Continue Xanax, salt tabs    Peripheral neuropathy - Continue Lyrica    Paranoid schizophrenia with hallucinations -Continue Seroquel, Invega, mirtazapine, Depakote    Mixed hyperlipidemia - Continue Lipitor  Chronic systolic CHF (congestive heart failure) (HCC) See summary from 5/9 - Continue digoxin, Entresto, Coreg           Subjective: No new complaints  Physical Exam: Vitals:   01/22/22 0745 01/22/22 0805 01/22/22 1120 01/22/22 1533  BP:  (!) 137/99 139/74 121/71  Pulse: 68 71 66 74  Resp:  20 18 20   Temp:  98.2 F (36.8 C) 98 F (36.7 C) 98.5 F (36.9 C)  TempSrc:  Oral Oral Oral  SpO2:  97% 100% 98%  Weight:      Height:       Adult male, sitting in chair, interactive, appropriate, gait normal, face metric, speech fluent, edentulous Data Reviewed: Nursing notes reviewed, vital signs reviewed       Disposition: Status is: Inpatient  Remains inpatient appropriate because: he was admitted for hyponatremia.  He does not have safe disposition and we await action by the state to appoint a guardian.       Author: , MD 01/22/2022 4:55 PM  For on call review [email or web address].

## 2022-01-23 DIAGNOSIS — E871 Hypo-osmolality and hyponatremia: Secondary | ICD-10-CM | POA: Diagnosis not present

## 2022-01-23 LAB — COMPREHENSIVE METABOLIC PANEL
ALT: 14 U/L (ref 0–44)
AST: 20 U/L (ref 15–41)
Albumin: 3.8 g/dL (ref 3.5–5.0)
Alkaline Phosphatase: 61 U/L (ref 38–126)
Anion gap: 7 (ref 5–15)
BUN: 9 mg/dL (ref 6–20)
CO2: 24 mmol/L (ref 22–32)
Calcium: 8.9 mg/dL (ref 8.9–10.3)
Chloride: 104 mmol/L (ref 98–111)
Creatinine, Ser: 0.93 mg/dL (ref 0.61–1.24)
GFR, Estimated: >60 mL/min (ref >60)
Glucose, Bld: 100 mg/dL — ABNORMAL HIGH (ref 70–99)
Potassium: 4.2 mmol/L (ref 3.5–5.1)
Sodium: 135 mmol/L (ref 135–145)
Total Bilirubin: 0.8 mg/dL (ref 0.3–1.2)
Total Protein: 6.9 g/dL (ref 6.5–8.1)

## 2022-01-23 NOTE — Plan of Care (Signed)
Pt doing well. Set expectations for the day with him about the staff. Pt understood.  Problem: Education: Goal: Knowledge of General Education information will improve Description: Including pain rating scale, medication(s)/side effects and non-pharmacologic comfort measures Outcome: Progressing   Problem: Health Behavior/Discharge Planning: Goal: Ability to manage health-related needs will improve Outcome: Progressing   Problem: Clinical Measurements: Goal: Ability to maintain clinical measurements within normal limits will improve Outcome: Progressing   Problem: Education: Goal: Knowledge of General Education information will improve Description: Including pain rating scale, medication(s)/side effects and non-pharmacologic comfort measures Outcome: Progressing   Problem: Health Behavior/Discharge Planning: Goal: Ability to manage health-related needs will improve Outcome: Progressing   Problem: Clinical Measurements: Goal: Ability to maintain clinical measurements within normal limits will improve Outcome: Progressing Goal: Will remain free from infection Outcome: Progressing Goal: Diagnostic test results will improve Outcome: Progressing Goal: Respiratory complications will improve Outcome: Progressing Goal: Cardiovascular complication will be avoided Outcome: Progressing   Problem: Activity: Goal: Risk for activity intolerance will decrease Outcome: Progressing   Problem: Nutrition: Goal: Adequate nutrition will be maintained Outcome: Progressing   Problem: Coping: Goal: Level of anxiety will decrease Outcome: Progressing   Problem: Elimination: Goal: Will not experience complications related to bowel motility Outcome: Progressing Goal: Will not experience complications related to urinary retention Outcome: Progressing   Problem: Pain Managment: Goal: General experience of comfort will improve Outcome: Progressing   Problem: Safety: Goal: Ability to  remain free from injury will improve Outcome: Progressing   Problem: Skin Integrity: Goal: Risk for impaired skin integrity will decrease Outcome: Progressing

## 2022-01-23 NOTE — Progress Notes (Signed)
  Progress Note   Patient: Jim Dunn [id number] DOB: 27-Feb-1966 DOA: 09/24/2021     116 DOS: the patient was seen and examined on 01/23/2022        Brief hospital course: [name] is a 56 y.o. M with schizophrenia, polydipsia who was brought by family for inability to care for patient.        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 See summary from 5/9 - Continue Xanax, salt tabs    Peripheral neuropathy - Continue Lyrica    Paranoid schizophrenia with hallucinations - Continue Seroquel, Invega, mirtazapine, Depakote    Mixed hyperlipidemia - Continue Lipitor  Chronic systolic CHF (congestive heart failure) (HCC) See summary from 5/9 - Continue digoxin, Entresto, Coreg           Subjective: Feeling well  Physical Exam: Vitals:   01/23/22 0448 01/23/22 0817 01/23/22 1145 01/23/22 1520  BP: 133/64 134/77 140/79 104/72  Pulse: 63 67 72 75  Resp: 18 20 20 20   Temp: 98.5 F (36.9 C) 98.6 F (37 C) 97.8 F (36.6 C) 99.6 F (37.6 C)  TempSrc: Oral Oral Oral Oral  SpO2: 99% 96% 100% 96%  Weight:      Height:       Adult male, sitting in chair, interactive, appropriate, edentulous RRR, no murmurs, no peripheral edema Respiratory rate normal, lungs clear without rales or wheezes     Data Reviewed: Nursing notes reviewed, vital signs reviewed       Disposition: Status is: Inpatient  Remains inpatient appropriate because: he was admitted for hyponatremia.  He does not have safe disposition and we await action by the state to appoint a guardian.       Author: , MD 01/23/2022 6:29 PM  For on call review [email or web address].

## 2022-01-23 NOTE — Progress Notes (Addendum)
CSW sent patient's most recent chest x-ray to Anitha at [facility] for review.  CSW spoke with patient's sister via text to provide her with an update.  CSW spoke with Marlene Lard of [facility] legal to provide him with an update.  CSW spoke with patient on unit to provide him with an update in discharge plan.  [name], MSW, LCSW Transitions of Care  Clinical Social Worker II [phone]

## 2022-01-24 DIAGNOSIS — E871 Hypo-osmolality and hyponatremia: Secondary | ICD-10-CM | POA: Diagnosis not present

## 2022-01-24 DIAGNOSIS — J431 Panlobular emphysema: Secondary | ICD-10-CM | POA: Diagnosis not present

## 2022-01-24 DIAGNOSIS — I5022 Chronic systolic (congestive) heart failure: Secondary | ICD-10-CM | POA: Diagnosis not present

## 2022-01-24 LAB — QUANTIFERON-TB GOLD PLUS (RQFGPL)
QuantiFERON Mitogen Value: >10 IU/mL
QuantiFERON Nil Value: 0.06 IU/mL
QuantiFERON TB1 Ag Value: 0.19 IU/mL
QuantiFERON TB2 Ag Value: 0.19 IU/mL

## 2022-01-24 LAB — QUANTIFERON-TB GOLD PLUS: QuantiFERON-TB Gold Plus: NEGATIVE

## 2022-01-24 NOTE — TOC Progression Note (Signed)
Transition of Care [facility]) - Progression Note    Patient Details  Name: Jim Dunn MRN: [id number] Date of Birth: [birth year]  Transition of Care [facility]) CM/SW Contact  Janae Bridgeman, RN Phone Number: 01/24/2022, 2:35 PM  Clinical Narrative:    CM called and spoke with Trenton Gammon, owner of [facility] and she is willing to review the patient's clinicals, FL2 and MAR for potential bed offer at the facility.  The facility is full at this time but will have a potential opening the first week of June 2023.  Clinicals were emailed to the facility to review at [email or web address].  The patient has scheduled court date for guardianship tomorrow with [facility] court systems with DSS involvement since the patient's family are unable to care for him in the home.   Expected Discharge Plan: Assisted Living Barriers to Discharge:  (Pending TB Gold Test, Guardianship date with [facility] DSS - on 5/24)  Expected Discharge Plan and Services Expected Discharge Plan: Assisted Living In-house Referral: Clinical Social Work, Artist (Guardianship hearing with [facility] DSS, [place] application pending)   Post Acute Care Choice:  (Firm bed offer with [facility] - [address] [place], [place] [address]) Living arrangements for the past 2 months: Single Family Home                                       Social Determinants of Health (SDOH) Interventions    Readmission Risk Interventions    01/18/2022    2:30 PM 01/23/2020    4:46 PM  Readmission Risk Prevention Plan  Transportation Screening Complete Complete  HRI or Home Care Consult  Complete  Social Work Consult for Recovery Care Planning/Counseling  Complete  Palliative Care Screening  Not Applicable  Medication Review Oceanographer) Complete   PCP or Specialist appointment within 3-5 days of discharge Complete   HRI or Home Care Consult Complete   SW Recovery Care/Counseling  Consult Complete   Palliative Care Screening Not Applicable   Skilled Nursing Facility Not Applicable

## 2022-01-24 NOTE — Progress Notes (Signed)
CSW spoke with Jim Dunn, guardianship social worker at [employer] DSS who states any ward of DSS is unable to be placed at [facility].   CSW spoke with patient and his GAL Jim Dunn at bedside to complete discussion regarding tomorrow's court hearing. CSW informed both patient and GAL about the retracted bed offer from [facility].  CSW emailed GAL a copy of patient's initial psych assessment and recent MD notes.  [name], MSW, LCSW Transitions of Care  Clinical Social Worker II [phone]
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
## 2022-01-25 NOTE — Progress Notes (Addendum)
CSW provided patient with an iPad to participate in court hearing.  CSW participated in court hearing on patient's behalf. [facility] Department of Social Services was appointed patient's interim guardian.  [name], MSW, LCSW Transitions of Care  Clinical Social Worker II [phone]

## 2022-01-25 NOTE — TOC Progression Note (Addendum)
Transition of Care [facility]) - Progression Note    Patient Details  Name: Jim Dunn MRN: [id number] Date of Birth: 10/03/1965  [facility]) CM/SW Contact  Janae Bridgeman, RN Phone Number: 01/25/2022, 8:47 AM  Clinical Narrative:    CM called and spoke with Malvin Johns, owner of [facility] and updated her that DSS has requested that patient not be placed at the facility. CM and LCSW with DTP Team will continue to follow the patient for placement needs and pending guardianship court appointment today at 3 pm with [facility] DSS.  Doristine Mango, owner with [facility] in [place] - No male beds available Trenton Gammon, [facility] - pending review of clinicals - open bed may be available June 2023 [facility] and [facility] - closed [facility] No answer   Expected Discharge Plan: Assisted Living Barriers to Discharge:  (Pending TB Gold Test, Guardianship date with [facility] DSS - on 5/24)  Expected Discharge Plan and Services Expected Discharge Plan: Assisted Living In-house Referral: Clinical Social Work, Artist (Guardianship hearing with [facility] DSS, [place] application pending)   Post Acute Care Choice:  (Firm bed offer with [facility] - [address], [place], [place] [address]) Living arrangements for the past 2 months: Single Family Home                                       Social Determinants of Health (SDOH) Interventions    Readmission Risk Interventions    01/18/2022    2:30 PM 01/23/2020    4:46 PM  Readmission Risk Prevention Plan  Transportation Screening Complete Complete  HRI or Home Care Consult  Complete  Social Work Consult for Recovery Care Planning/Counseling  Complete  Palliative Care Screening  Not Applicable  Medication Review Oceanographer) Complete   PCP or Specialist appointment within 3-5 days of discharge Complete   HRI or Home Care Consult Complete   SW Recovery Care/Counseling  Consult Complete   Palliative Care Screening Not Applicable   Skilled Nursing Facility Not Applicable

## 2022-01-25 NOTE — Progress Notes (Signed)
Seen on unit--walking around No fever chills rigor --watching [facility] memorial on phone Cleaned his dinner plate.  Courts has made him DSS ward  NO charge  Await placement--thank you DTP  Pleas Koch, MD [facility] 8:27 PM

## 2022-01-26 DIAGNOSIS — E871 Hypo-osmolality and hyponatremia: Secondary | ICD-10-CM | POA: Diagnosis not present

## 2022-01-26 NOTE — TOC Progression Note (Signed)
Transition of Care [facility]) - Progression Note    Patient Details  Name: Jim Dunn MRN: [id number] Date of Birth: [birth year]  Transition of Care [facility]) CM/SW Contact  Janae Bridgeman, RN Phone Number: 01/26/2022, 12:31 PM  Clinical Narrative:    CM called and spoke with Valda Lamb, owner of [facility] [phone]) in [place], [place] and the facility has an available bed at this time and the owner is willing to review the patient's clinicals for a possible bed offer at the facility.  A bed will be available around 02/02/22 or 02/03/22 if an acceptable offer is made.  Clinicals including recent progress notes, FL2, TB Gold results, CXR, MAR and Legal guardianship hearing note by Dante Gang dated 01/25/2022 were sent to facility fax # [phone].   The facility is aware that the patient is a ward of the state and that DSS is guardian and wil need to be contacted for permission and payment for admission.  This information will be forwarded to DSS to discuss possible admission to the facility if bed offer is made.  CM and LCSW with DTP Team will continue to follow the patient for [facility] placement.   Expected Discharge Plan: Assisted Living Barriers to Discharge:  (Pending TB Gold Test, Guardianship date with [facility] DSS - on 5/24)  Expected Discharge Plan and Services Expected Discharge Plan: Assisted Living In-house Referral: Clinical Social Work, Artist (Guardianship hearing with [facility] DSS, [place] application pending)   Post Acute Care Choice:  (Firm bed offer with [facility] - [address] [place], [place] [address]) Living arrangements for the past 2 months: Single Family Home                                       Social Determinants of Health (SDOH) Interventions    Readmission Risk Interventions    01/18/2022    2:30 PM 01/23/2020    4:46 PM  Readmission Risk Prevention Plan  Transportation Screening Complete Complete  HRI  or Home Care Consult  Complete  Social Work Consult for Recovery Care Planning/Counseling  Complete  Palliative Care Screening  Not Applicable  Medication Review Oceanographer) Complete   PCP or Specialist appointment within 3-5 days of discharge Complete   HRI or Home Care Consult Complete   SW Recovery Care/Counseling Consult Complete   Palliative Care Screening Not Applicable   Skilled Nursing Facility Not Applicable
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
## 2022-01-27 LAB — BASIC METABOLIC PANEL
Anion gap: 8 (ref 5–15)
BUN: 10 mg/dL (ref 6–20)
CO2: 23 mmol/L (ref 22–32)
Calcium: 8.8 mg/dL — ABNORMAL LOW (ref 8.9–10.3)
Chloride: 104 mmol/L (ref 98–111)
Creatinine, Ser: 0.68 mg/dL (ref 0.61–1.24)
GFR, Estimated: >60 mL/min (ref >60)
Glucose, Bld: 121 mg/dL — ABNORMAL HIGH (ref 70–99)
Potassium: 4.2 mmol/L (ref 3.5–5.1)
Sodium: 135 mmol/L (ref 135–145)

## 2022-01-27 NOTE — TOC Progression Note (Addendum)
Transition of Care [facility]) - Progression Note    Patient Details  Name: Jim Dunn MRN: [id number] Date of Birth: [birth year]  Transition of Care [facility]) CM/SW Contact  Janae Bridgeman, RN Phone Number: 01/27/2022, 9:13 AM  Clinical Narrative:    CM called and spoke with Valda Lamb, owner of [facility] and she is willing to offer the patient an admission bed at the facility next week. The family care home is unable to supply tobacco products to the patient for use at the facility and has asked that arrangements be made with the patient's family to supply this to the patient.  Claudette Laws, LCSW reached out to the patient's sister, [name] to communicate this request to have family provide money for patient's cigarettes.  [name], interim guardian with [facility] is aware of bed offer and possible pending admission next week.  01/27/2022 9509 - CM called and spoke with Valda Lamb, owner of [facility] and she has declined a bed offer to the patient at this time since the patient's room and board were unable to provide for the tobacco products and family unable to provide as well.  01/27/2022 0950 - I called and left a message with Trenton Gammon, owner of [facility] to check on bed availability at the facility.    01/27/2022 1003 - CM called and spoke with Meyer Cory, administrator with [facility] [phone] and the facility has bed availability - clinicals including FL2, progress note, CXR and TB Gold results faxed to the facility for review by the owner - attn Barbie Banner - Fax # [phone].  [address] [place], [facility] in [place], [place] - [phone] - Clinicals faxed to the facility for review - Fax # [phone].  [phone] - CM spoke with Trenton Gammon, owner of [facility] and she is reviewing the patient's clinicals as well.  CM and LCSW is continuing to follow the patient for placement needs.   Expected Discharge Plan: Assisted  Living Barriers to Discharge:  (Pending TB Gold Test, Guardianship date with [facility] DSS - on 5/24)  Expected Discharge Plan and Services Expected Discharge Plan: Assisted Living In-house Referral: Clinical Social Work, Artist (Guardianship hearing with [facility] DSS, [place] application pending)   Post Acute Care Choice:  (Firm bed offer with [facility] - [address] [place], [place] [address]) Living arrangements for the past 2 months: Single Family Home                                       Social Determinants of Health (SDOH) Interventions    Readmission Risk Interventions    01/18/2022    2:30 PM 01/23/2020    4:46 PM  Readmission Risk Prevention Plan  Transportation Screening Complete Complete  HRI or Home Care Consult  Complete  Social Work Consult for Recovery Care Planning/Counseling  Complete  Palliative Care Screening  Not Applicable  Medication Review Oceanographer) Complete   PCP or Specialist appointment within 3-5 days of discharge Complete   HRI or Home Care Consult Complete   SW Recovery Care/Counseling Consult Complete   Palliative Care Screening Not Applicable   Skilled Nursing Facility Not Applicable

## 2022-01-27 NOTE — Progress Notes (Signed)
Seen and examined comfortable sitting up in the bed no distress No changes to overall plan Disposition pending  No charge  Pleas Koch, MD [facility] 5:03 PM

## 2022-01-27 NOTE — Progress Notes (Addendum)
CSW spoke with [name] at [facility] DSS who states she is agreeable for placement at [facility] in [place]. CSW informed [name] of financial requirements for admission into the home. [name] states patient must be provided with $90 out of his monthly income for allowance.   CSW spoke with [name] at [facility] who states due to the patient being independent with his ADL's she would be unable to accept him into the home.   CSW spoke with patient's sister who states the patient's check is $1,527 monthly.  [name], MSW, LCSW Transitions of Care  Clinical Social Worker II [phone]

## 2022-01-28 DIAGNOSIS — E871 Hypo-osmolality and hyponatremia: Secondary | ICD-10-CM | POA: Diagnosis not present
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
## 2022-01-29 NOTE — Progress Notes (Signed)
Seen examined and discssed with RN  Patient stable no new issues--eating drinking Patient encouraged [RN present] to perform self-care and keep surroundings clean Patient seems to be willing to comply  Pleas Koch, MD [facility] 11:31 AM

## 2022-01-30 DIAGNOSIS — E871 Hypo-osmolality and hyponatremia: Secondary | ICD-10-CM | POA: Diagnosis not present

## 2022-01-30 LAB — COMPREHENSIVE METABOLIC PANEL
ALT: 12 U/L (ref 0–44)
AST: 18 U/L (ref 15–41)
Albumin: 3.5 g/dL (ref 3.5–5.0)
Alkaline Phosphatase: 56 U/L (ref 38–126)
Anion gap: 7 (ref 5–15)
BUN: 7 mg/dL (ref 6–20)
CO2: 26 mmol/L (ref 22–32)
Calcium: 8.6 mg/dL — ABNORMAL LOW (ref 8.9–10.3)
Chloride: 98 mmol/L (ref 98–111)
Creatinine, Ser: 0.62 mg/dL (ref 0.61–1.24)
GFR, Estimated: >60 mL/min (ref >60)
Glucose, Bld: 90 mg/dL (ref 70–99)
Potassium: 4.1 mmol/L (ref 3.5–5.1)
Sodium: 131 mmol/L — ABNORMAL LOW (ref 135–145)
Total Bilirubin: 0.7 mg/dL (ref 0.3–1.2)
Total Protein: 6.5 g/dL (ref 6.5–8.1)
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
## 2022-01-30 NOTE — Plan of Care (Signed)

## 2022-01-31 DIAGNOSIS — E871 Hypo-osmolality and hyponatremia: Secondary | ICD-10-CM | POA: Diagnosis not present

## 2022-01-31 NOTE — TOC Progression Note (Addendum)
Transition of Care [facility]) - Progression Note    Patient Details  Name: Jim Dunn MRN: [id number] Date of Birth: Dec 27, 1965  Transition of [facility]) CM/SW Contact  Janae Bridgeman, RN Phone Number: 01/31/2022, 8:50 AM  Clinical Narrative:    CM called and left a message with Trenton Gammon, Owner of [facility] for follow up regarding review of clinicals for possible bed offer.  0900 - Called and spoke with Oneal Grout [facility] [phone] and the owner is unable to offer a bed to the patient at this time.  I called and spoke with the office assistant with [facility] [phone] and she asks that I call back in the morning to follow up after review of clinicals..  [facility] - [phone] - No open beds at this time [facility] - [phone] - No open beds at this time  CM called and spoke with Silas Flood, Administrator with [facility] - [phone] - Facility with bed availability at this time and willing to review clinicals - clinicals sent to Fax # [phone].  CM will follow up.  CM and MSW with DTP Team will continue to follow the patient for needed [facility] Placement.   Expected Discharge Plan: Assisted Living   Expected Discharge Plan and Services Expected Discharge Plan: Assisted Living   Living arrangements for the past 2 months: Single Family Home                                       Social Determinants of Health (SDOH) Interventions    Readmission Risk Interventions    01/18/2022    2:30 PM 01/23/2020    4:46 PM  Readmission Risk Prevention Plan  Transportation Screening Complete Complete  HRI or Home Care Consult  Complete  Social Work Consult for Recovery Care Planning/Counseling  Complete  Palliative Care Screening  Not Applicable  Medication Review Oceanographer) Complete   PCP or Specialist appointment within 3-5 days of discharge Complete   HRI or Home Care Consult Complete    SW Recovery Care/Counseling Consult Complete   Palliative Care Screening Not Applicable   Skilled Nursing Facility Not Applicable

## 2022-01-31 NOTE — Progress Notes (Signed)
CSW spoke with Tammy at [place] for [facility] to provide clarity on patient. CSW reviewed [place] [employer] and patient has an active [id number] P. Patient's current benefits state are categorized as "Qualified Medicare Beneficiary - Part B Premium Only". The number was given to [facility] for review.  CSW spoke with Nira Conn at [place] to provide her with an update.  Madilyn Fireman, MSW, LCSW Transitions of Care  Clinical Social Worker II [phone]
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
## 2022-02-01 DIAGNOSIS — K5900 Constipation, unspecified: Secondary | ICD-10-CM | POA: Diagnosis not present

## 2022-02-01 DIAGNOSIS — F209 Schizophrenia, unspecified: Secondary | ICD-10-CM

## 2022-02-01 DIAGNOSIS — E871 Hypo-osmolality and hyponatremia: Secondary | ICD-10-CM | POA: Diagnosis not present

## 2022-02-01 DIAGNOSIS — I5022 Chronic systolic (congestive) heart failure: Secondary | ICD-10-CM | POA: Diagnosis not present
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
## 2022-02-02 DIAGNOSIS — I5022 Chronic systolic (congestive) heart failure: Secondary | ICD-10-CM | POA: Diagnosis not present

## 2022-02-02 DIAGNOSIS — E871 Hypo-osmolality and hyponatremia: Secondary | ICD-10-CM | POA: Diagnosis not present

## 2022-02-02 DIAGNOSIS — J431 Panlobular emphysema: Secondary | ICD-10-CM | POA: Diagnosis not present

## 2022-02-02 DIAGNOSIS — F209 Schizophrenia, unspecified: Secondary | ICD-10-CM | POA: Diagnosis not present

## 2022-02-02 NOTE — Progress Notes (Signed)
CSW spoke with patient at bedside to provide him with updates. CSW explained to patient that the previous Medicaid application he submitted in [facility] was denied due to there being missing information. Patient's guardian Nira Conn at [place] is communicating with [facility] DSS staff to obtain necessary paperwork for Medicaid application.  Madilyn Fireman, MSW, LCSW Transitions of Care  Clinical Social Worker II [phone]
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
## 2022-02-03 DIAGNOSIS — I5022 Chronic systolic (congestive) heart failure: Secondary | ICD-10-CM | POA: Diagnosis not present

## 2022-02-03 DIAGNOSIS — E871 Hypo-osmolality and hyponatremia: Secondary | ICD-10-CM | POA: Diagnosis not present

## 2022-02-03 DIAGNOSIS — J431 Panlobular emphysema: Secondary | ICD-10-CM | POA: Diagnosis not present

## 2022-02-03 DIAGNOSIS — F209 Schizophrenia, unspecified: Secondary | ICD-10-CM | POA: Diagnosis not present

## 2022-02-03 LAB — CBC
HCT: 35.6 % — ABNORMAL LOW (ref 39.0–52.0)
Hemoglobin: 11.8 g/dL — ABNORMAL LOW (ref 13.0–17.0)
MCH: 23.1 pg — ABNORMAL LOW (ref 26.0–34.0)
MCHC: 33.1 g/dL (ref 30.0–36.0)
MCV: 69.8 fL — ABNORMAL LOW (ref 80.0–100.0)
Platelets: 158 10*3/uL (ref 150–400)
RBC: 5.1 MIL/uL (ref 4.22–5.81)
RDW: 15.8 % — ABNORMAL HIGH (ref 11.5–15.5)
WBC: 6.7 10*3/uL (ref 4.0–10.5)
nRBC: 0 % (ref 0.0–0.2)

## 2022-02-03 LAB — COMPREHENSIVE METABOLIC PANEL
ALT: 12 U/L (ref 0–44)
AST: 16 U/L (ref 15–41)
Albumin: 3.5 g/dL (ref 3.5–5.0)
Alkaline Phosphatase: 55 U/L (ref 38–126)
Anion gap: 7 (ref 5–15)
BUN: 10 mg/dL (ref 6–20)
CO2: 24 mmol/L (ref 22–32)
Calcium: 8.7 mg/dL — ABNORMAL LOW (ref 8.9–10.3)
Chloride: 101 mmol/L (ref 98–111)
Creatinine, Ser: 0.69 mg/dL (ref 0.61–1.24)
GFR, Estimated: >60 mL/min (ref >60)
Glucose, Bld: 100 mg/dL — ABNORMAL HIGH (ref 70–99)
Potassium: 4 mmol/L (ref 3.5–5.1)
Sodium: 132 mmol/L — ABNORMAL LOW (ref 135–145)
Total Bilirubin: 0.4 mg/dL (ref 0.3–1.2)
Total Protein: 6.1 g/dL — ABNORMAL LOW (ref 6.5–8.1)

## 2022-02-03 LAB — MAGNESIUM: Magnesium: 1.9 mg/dL (ref 1.7–2.4)

## 2022-02-03 NOTE — Progress Notes (Signed)
CSW spoke with patient at nurse's station. Patient in excellent spirits as he just got a haircut and today is his birthday. All questions answered.  [name], MSW, LCSW Transitions of Care  Clinical Social Worker II [phone]
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
## 2022-02-04 DIAGNOSIS — I5022 Chronic systolic (congestive) heart failure: Secondary | ICD-10-CM | POA: Diagnosis not present

## 2022-02-04 DIAGNOSIS — F209 Schizophrenia, unspecified: Secondary | ICD-10-CM | POA: Diagnosis not present

## 2022-02-04 DIAGNOSIS — J431 Panlobular emphysema: Secondary | ICD-10-CM | POA: Diagnosis not present

## 2022-02-04 DIAGNOSIS — E871 Hypo-osmolality and hyponatremia: Secondary | ICD-10-CM | POA: Diagnosis not present
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
## 2022-02-05 DIAGNOSIS — F209 Schizophrenia, unspecified: Secondary | ICD-10-CM | POA: Diagnosis not present

## 2022-02-05 DIAGNOSIS — J431 Panlobular emphysema: Secondary | ICD-10-CM | POA: Diagnosis not present

## 2022-02-05 DIAGNOSIS — I5022 Chronic systolic (congestive) heart failure: Secondary | ICD-10-CM | POA: Diagnosis not present

## 2022-02-05 DIAGNOSIS — E871 Hypo-osmolality and hyponatremia: Secondary | ICD-10-CM | POA: Diagnosis not present
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
## 2022-02-06 DIAGNOSIS — E871 Hypo-osmolality and hyponatremia: Secondary | ICD-10-CM | POA: Diagnosis not present

## 2022-02-06 DIAGNOSIS — I5022 Chronic systolic (congestive) heart failure: Secondary | ICD-10-CM | POA: Diagnosis not present

## 2022-02-06 DIAGNOSIS — F209 Schizophrenia, unspecified: Secondary | ICD-10-CM | POA: Diagnosis not present

## 2022-02-06 DIAGNOSIS — J431 Panlobular emphysema: Secondary | ICD-10-CM | POA: Diagnosis not present
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
## 2022-02-07 DIAGNOSIS — E871 Hypo-osmolality and hyponatremia: Secondary | ICD-10-CM | POA: Diagnosis not present

## 2022-02-07 DIAGNOSIS — J431 Panlobular emphysema: Secondary | ICD-10-CM | POA: Diagnosis not present

## 2022-02-07 DIAGNOSIS — E669 Obesity, unspecified: Secondary | ICD-10-CM

## 2022-02-07 DIAGNOSIS — F209 Schizophrenia, unspecified: Secondary | ICD-10-CM | POA: Diagnosis not present

## 2022-02-07 DIAGNOSIS — I5022 Chronic systolic (congestive) heart failure: Secondary | ICD-10-CM | POA: Diagnosis not present
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
## 2022-02-07 NOTE — Progress Notes (Signed)
There are no discharge updates available at this time. CSW is awaiting confirmation from [place] [facility] [place] [facility] that a new Medicaid application has been submitted on the patient's behalf as the previously submitted application was denied due to lack of information.  [name], MSW, LCSW Transitions of Care  Clinical Social Worker II [phone]

## 2022-02-08 DIAGNOSIS — E871 Hypo-osmolality and hyponatremia: Secondary | ICD-10-CM | POA: Diagnosis not present
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
## 2022-02-08 NOTE — Progress Notes (Signed)
CSW spoke with [name] at [place] [facility] who states she has received information from the [facility] worker regarding the reason for application being denied.  Patient is only eligible for Special Assistance Medicaid but only enhanced for a special care unit due to his income. Patient is not appropriate for a special care unit at this time due to his level of independence and cognitive capabilities. Patient is also inappropriate for placement in a SNF for several reasons. CSW will discuss further with [name] to determine how to proceed for discharge planning.  [name], MSW, LCSW Transitions of Care  Clinical Social Worker II [phone]

## 2022-02-09 DIAGNOSIS — F0390 Unspecified dementia without behavioral disturbance: Secondary | ICD-10-CM | POA: Diagnosis not present

## 2022-02-09 DIAGNOSIS — E871 Hypo-osmolality and hyponatremia: Secondary | ICD-10-CM | POA: Diagnosis not present

## 2022-02-09 NOTE — Progress Notes (Signed)
CSW spoke with patient to inform him of information obtained yesterday from his legal guardian. Patient stated understanding and was agreeable to be placed in a boarding house if his guardian approved. Patient also questioned if he could return to [facility] to live with his girlfriend. CSW sent e-mail to guardian requesting clarity on placement options.  [name], MSW, LCSW Transitions of Care  Clinical Social Worker II [phone]
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
## 2022-02-10 DIAGNOSIS — E871 Hypo-osmolality and hyponatremia: Secondary | ICD-10-CM | POA: Diagnosis not present

## 2022-02-10 DIAGNOSIS — F0390 Unspecified dementia without behavioral disturbance: Secondary | ICD-10-CM | POA: Diagnosis not present
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
## 2022-02-10 NOTE — Progress Notes (Signed)
CSW spoke with patient at bedside to discuss his discharge plan. Patient had several questions about what he is allowed to do vs not do as he has a new guardian. Patient provided CSW with contact information for his girlfriend, Woodfin Ganja who lives in [place].  CSW attempted to reach patient's guardian without success - a voicemail was left requesting a return call.  [name], MSW, LCSW Transitions of Care  Clinical Social Worker II [phone]

## 2022-02-11 DIAGNOSIS — G40909 Epilepsy, unspecified, not intractable, without status epilepticus: Secondary | ICD-10-CM | POA: Diagnosis not present

## 2022-02-11 DIAGNOSIS — F0390 Unspecified dementia without behavioral disturbance: Secondary | ICD-10-CM | POA: Diagnosis not present

## 2022-02-11 DIAGNOSIS — E871 Hypo-osmolality and hyponatremia: Secondary | ICD-10-CM | POA: Diagnosis not present
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
## 2022-02-12 DIAGNOSIS — E871 Hypo-osmolality and hyponatremia: Secondary | ICD-10-CM | POA: Diagnosis not present

## 2022-02-12 DIAGNOSIS — F200 Paranoid schizophrenia: Secondary | ICD-10-CM | POA: Diagnosis not present

## 2022-02-12 MED ORDER — ALPRAZOLAM 0.5 MG PO TABS
0.5000 mg | ORAL_TABLET | Freq: Once | ORAL | Status: AC
  Administered 2022-02-12: 0.5 mg via ORAL
  Filled 2022-02-12: qty 1

## 2022-02-12 MED ORDER — ALPRAZOLAM 0.5 MG PO TABS
1.0000 mg | ORAL_TABLET | Freq: Two times a day (BID) | ORAL | Status: DC
  Administered 2022-02-12 – 2022-04-07 (×108): 1 mg via ORAL
  Filled 2022-02-12 (×108): qty 2
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
## 2022-02-13 DIAGNOSIS — E871 Hypo-osmolality and hyponatremia: Secondary | ICD-10-CM | POA: Diagnosis not present

## 2022-02-13 DIAGNOSIS — F0390 Unspecified dementia without behavioral disturbance: Secondary | ICD-10-CM | POA: Diagnosis not present

## 2022-02-13 DIAGNOSIS — F200 Paranoid schizophrenia: Secondary | ICD-10-CM | POA: Diagnosis not present

## 2022-02-13 DIAGNOSIS — G40909 Epilepsy, unspecified, not intractable, without status epilepticus: Secondary | ICD-10-CM | POA: Diagnosis not present
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
## 2022-02-14 DIAGNOSIS — E871 Hypo-osmolality and hyponatremia: Secondary | ICD-10-CM | POA: Diagnosis not present

## 2022-02-14 DIAGNOSIS — F0390 Unspecified dementia without behavioral disturbance: Secondary | ICD-10-CM | POA: Diagnosis not present
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
## 2022-02-15 DIAGNOSIS — E871 Hypo-osmolality and hyponatremia: Secondary | ICD-10-CM | POA: Diagnosis not present

## 2022-02-15 NOTE — TOC Progression Note (Signed)
Transition of Care (TOC) - Progression Note    Patient Details  Name: Jim Dunn MRN: [id number] Date of Birth: 01/22/1966  Transition of Care (TOC) CM/SW Contact   R Stubbldfield, RN Phone Number: 02/15/2022, 9:57 AM  Clinical Narrative:    CM met with the patient to update him that Heather Hicks, DSS Guardian is continuing to explore options for placement and patient updated that Group home or ALF is needed since DSS had determined that patient is unsafe to live independently per DSS.  The patient was made aware that DSS is continuing to explore options for placement.  No placement determined at this time.   Expected Discharge Plan: Assisted Living Barriers to Discharge:  (Pending placement by DSS Guardian)  Expected Discharge Plan and Services Expected Discharge Plan: Assisted Living In-house Referral: Clinical Social Work Discharge Planning Services: Medication Assistance, CM Consult Post Acute Care Choice:  (Needs [facility]/ALF placement) Living arrangements for the past 2 months: Single Family Home                                       Social Determinants of Health (SDOH) Interventions    Readmission Risk Interventions    01/18/2022    2:30 PM 01/23/2020    4:46 PM  Readmission Risk Prevention Plan  Transportation Screening Complete Complete  HRI or Home Care Consult  Complete  Social Work Consult for Recovery Care Planning/Counseling  Complete  Palliative Care Screening  Not Applicable  Medication Review (RN Care Manager) Complete   PCP or Specialist appointment within 3-5 days of discharge Complete   HRI or Home Care Consult Complete   SW Recovery Care/Counseling Consult Complete   Palliative Care Screening Not Applicable   Skilled Nursing Facility Not Applicable     
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
## 2022-02-15 NOTE — TOC Progression Note (Signed)
Transition of Care [facility]) - Progression Note    Patient Details  Name: Jim Dunn MRN: [id number] Date of Birth: 06/27/1966  Transition of Care [facility]) CM/SW [place], RN Phone Number: 02/15/2022, 8:23 AM  Clinical Narrative:    CM called and spoke with Wayna Chalet, [place] with [facility] and the states that she is exploring options for placement at this time but does not have available Group Home/Boarding House for placement at this time.  Ishmael Holter, DSS Guardian states that the patient is unable to live independently and she is exploring options at this time.  TOC Leadership is aware of barrier to placement.   Expected Discharge Plan: Assisted Living Barriers to Discharge: Other (must enter comment) (Patient needed [facility] placement - [place] has  guardianship)  Expected Discharge Plan and Services Expected Discharge Plan: Assisted Living In-house Referral: Clinical Social Work, Development worker, community (Guardianship hearing with [place], [place] application pending)   Post Acute Care Choice:  (Needs FCH/ALF placement) Living arrangements for the past 2 months: Single Family Home                                       Social Determinants of Health (SDOH) Interventions    Readmission Risk Interventions    01/18/2022    2:30 PM 01/23/2020    4:46 PM  Readmission Risk Prevention Plan  Transportation Screening Complete Complete  HRI or Home Care Consult  Complete  Social Work Consult for [place] Planning/Counseling  Complete  Palliative Care Screening  Not Applicable  Medication Review Press photographer) Complete   PCP or Specialist appointment within 3-5 days of discharge Complete   HRI or [place] Complete   SW Recovery Care/Counseling Consult Complete   [place] Not Applicable

## 2022-02-16 DIAGNOSIS — E871 Hypo-osmolality and hyponatremia: Secondary | ICD-10-CM | POA: Diagnosis not present

## 2022-02-16 LAB — BASIC METABOLIC PANEL
Anion gap: 9 (ref 5–15)
BUN: 7 mg/dL (ref 6–20)
CO2: 25 mmol/L (ref 22–32)
Calcium: 9.1 mg/dL (ref 8.9–10.3)
Chloride: 102 mmol/L (ref 98–111)
Creatinine, Ser: 0.69 mg/dL (ref 0.61–1.24)
GFR, Estimated: >60 mL/min (ref >60)
Glucose, Bld: 93 mg/dL (ref 70–99)
Potassium: 4.2 mmol/L (ref 3.5–5.1)
Sodium: 136 mmol/L (ref 135–145)
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
## 2022-02-16 NOTE — TOC Progression Note (Addendum)
Transition of Care [facility]) - Progression Note    Patient Details  Name: Jim Dunn MRN: [id number] Date of Birth: April 30, 1966  Transition of Care [facility]) CM/SW Contact  Janae Bridgeman, RN Phone Number: 02/16/2022, 10:44 AM  Clinical Narrative:    CM called and spoke with [name]. DSS Guardian and the patient did not qualify for Special Assistance Medicaid through [facility] and needs ALF/Family Care Home placement.  Willa Rough was updated that the patient would not qualify for LTC placement in a nursing home since he is ambulatory with no needs for ADL assistance.  I explained to [place], DSS Guardian that [facility] had bed openings recently but the owner, Valda Lamb was unable to supply tobacco products for the patient - that this would need to be discussed with the family.  Willa Rough, Guardian states that she will call and speak with the patient's family on the phone along with [facility] to see if placement can be arranged at this facility.  Willa Rough will follow up with DTP [facility] Team regarding placement.    CM spoke with [place] Silos, MSW [facility] Supervisor and he is aware of the above note and will continue to follow and assist with support for placement.  [name] [place] will be the new [facility] Guardian starting 02/17/2022 - [phone] or [phone].  CM and MSW with DTP Team will continue to follow the patient for needed [facility] Placement.  Expected Discharge Plan: Assisted Living Barriers to Discharge:  (Pending placement by DSS Guardian)  Expected Discharge Plan and Services Expected Discharge Plan: Assisted Living In-house Referral: Clinical Social Work Discharge Planning Services: Medication Assistance, CM Consult Post Acute Care Choice:  (Needs FCH/ALF placement) Living arrangements for the past 2 months: Single Family Home                                       Social Determinants of Health (SDOH) Interventions     Readmission Risk Interventions    01/18/2022    2:30 PM 01/23/2020    4:46 PM  Readmission Risk Prevention Plan  Transportation Screening Complete Complete  HRI or Home Care Consult  Complete  Social Work Consult for Recovery Care Planning/Counseling  Complete  Palliative Care Screening  Not Applicable  Medication Review Oceanographer) Complete   PCP or Specialist appointment within 3-5 days of discharge Complete   HRI or Home Care Consult Complete   SW Recovery Care/Counseling Consult Complete   Palliative Care Screening Not Applicable   Skilled Nursing Facility Not Applicable

## 2022-02-17 DIAGNOSIS — E871 Hypo-osmolality and hyponatremia: Secondary | ICD-10-CM | POA: Diagnosis not present

## 2022-02-17 NOTE — TOC Progression Note (Addendum)
Transition of Care [facility]) - Progression Note    Patient Details  Name: Jim Dunn MRN: [id number] Date of Birth: [birth year]  Transition of Care [facility]) CM/SW Contact  Janae Bridgeman, RN Phone Number: 02/17/2022, 11:37 AM  Clinical narrative:    CM spoke with [name], [facility] and updated [facility] was faxed to her to continue search for accepting [facility].   02/17/2022 1400 - CM received email from [facility], DSS Guardian stating that she spoke with [facility] and the facility is currently reviewing the patient's clinicals at this time.  CM and MSW with DTP Team will continue to follow the patient for discharge planning - No bed for available disposition at this time.   Expected Discharge Plan: Assisted Living Barriers to Discharge:  (Pending placement by DSS Guardian)  Expected Discharge Plan and Services Expected Discharge Plan: Assisted Living In-house Referral: Clinical Social Work Discharge Planning Services: Medication Assistance, CM Consult Post Acute Care Choice:  (Needs FCH/ALF placement) Living arrangements for the past 2 months: Single Family Home                                       Social Determinants of Health (SDOH) Interventions    Readmission Risk Interventions    01/18/2022    2:30 PM 01/23/2020    4:46 PM  Readmission Risk Prevention Plan  Transportation Screening Complete Complete  HRI or Home Care Consult  Complete  Social Work Consult for Recovery Care Planning/Counseling  Complete  Palliative Care Screening  Not Applicable  Medication Review Oceanographer) Complete   PCP or Specialist appointment within 3-5 days of discharge Complete   HRI or Home Care Consult Complete   SW Recovery Care/Counseling Consult Complete   Palliative Care Screening Not Applicable   Skilled Nursing Facility Not Applicable
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
## 2022-02-17 NOTE — [place] FL2 (Signed)
[place] MEDICAID FL2 LEVEL OF CARE SCREENING TOOL     IDENTIFICATION  Patient Name: Jim Dunn Birthdate: 1966-01-10 Sex: male Admission Date (Current Location): 09/24/2021  [facility] and [place] Number:  Social worker and Address:  [facility], [address] [place], [place] [address]      Provider Number: [id number]  Attending Physician Name and Address:  Noralee Stain, DO  Relative Name and Phone Number:  Constance Haw [place] DSS Guardian [phone], [phone]    Current Level of Care: Hospital Recommended Level of Care: Assisted Living Facility, Family Care Home Prior Approval Number:    Date Approved/Denied:   PASRR Number:    Discharge Plan: Other (Comment) (ALF, Group Home, or Family Care Home)    Current Diagnoses: Patient Active Problem List   Diagnosis Date Noted   Obesity (BMI 30-39.9) 02/07/2022   Hyponatremia    Peripheral neuropathy 10/20/2021   Dementia without behavioral disturbance (HCC) 09/25/2021   Needs assistance with community resources 09/12/2021   Disorganized schizophrenia (HCC)    Dementia (HCC) 06/08/2020   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   Chronic post-traumatic stress disorder (PTSD)    Noncompliance with medications    Subtherapeutic serum dilantin level    Seizure (HCC)    Hypomagnesemia 02/23/2020   Suicidal ideation 02/23/2020   Paranoid schizophrenia with hallucinations    Hypokalemia    Chronic systolic CHF (congestive heart failure) (HCC) 01/22/2020   Mixed hyperlipidemia 01/22/2020   COPD (chronic obstructive pulmonary disease) (HCC) 01/22/2020   Microcytic anemia 01/22/2020   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 01/22/2020   Seizure disorder (HCC) 01/22/2020    Orientation RESPIRATION BLADDER Height & Weight     Self, Time, Situation, Place  Normal Continent Weight: 116.7 kg Height:  5\' 8"  (172.7  cm)  BEHAVIORAL SYMPTOMS/MOOD NEUROLOGICAL BOWEL NUTRITION STATUS     (past history of Seizures) Continent Diet (See Discharge Summary)  AMBULATORY STATUS COMMUNICATION OF NEEDS Skin   Independent Verbally Normal                       Personal Care Assistance Level of Assistance  Bathing, Feeding, Dressing Bathing Assistance: Independent Feeding assistance: Independent Dressing Assistance: Independent     Functional Limitations Info  Sight, Hearing, Speech Sight Info: Adequate Hearing Info: Adequate Speech Info: Adequate    SPECIAL CARE FACTORS FREQUENCY                       Contractures Contractures Info: Not present    Additional Factors Info  Code Status, Allergies, Psychotropic Code Status Info: Full code Allergies Info: NKDA Psychotropic Info: Xanex, Depakote, Ativan, Remeron, Invega, Lyrica, Seroquel         Current Medications (02/17/2022):  This is the current hospital active medication list Current Facility-Administered Medications  Medication Dose Route Frequency Provider Last Rate Last Admin   acetaminophen (TYLENOL) tablet 650 mg  650 mg Oral Q6H PRN 02/19/2022, MD   650 mg at 02/05/22 2047   ALPRAZolam 2048) tablet 1 mg  1 mg Oral BID Prudy Feeler, MD   1 mg at 02/17/22 1111   amLODipine (NORVASC) tablet 10 mg  10 mg Oral Daily 02/19/22, MD   10 mg at 02/17/22 1110   atorvastatin (LIPITOR) tablet 40 mg  40 mg Oral Daily 02/19/22 B, MD   40 mg at 02/17/22 1111  bisacodyl (DULCOLAX) suppository 10 mg  10 mg Rectal Daily PRN Maretta Bees, MD   10 mg at 02/15/22 1423   carvedilol (COREG) tablet 25 mg  25 mg Oral BID WC Danford, Earl Lites, MD   25 mg at 02/17/22 0846   digoxin (LANOXIN) tablet 0.25 mg  0.25 mg Oral Daily Max Fickle B, MD   0.25 mg at 02/17/22 1111   divalproex (DEPAKOTE ER) 24 hr tablet 500 mg  500 mg Oral BID Max Fickle B, MD   500 mg at 02/17/22 1111   LORazepam (ATIVAN) injection  2 mg  2 mg Intravenous Q5 Min x 2 PRN Max Fickle B, MD       melatonin tablet 3 mg  3 mg Oral QHS Max Fickle B, MD   3 mg at 02/16/22 2153   mirtazapine (REMERON) tablet 15 mg  15 mg Oral QHS Max Fickle B, MD   15 mg at 02/16/22 2153   nicotine polacrilex (NICORETTE) gum 2 mg  2 mg Oral PRN Lonia Blood, MD   2 mg at 01/20/22 1000   paliperidone (INVEGA) 24 hr tablet 6 mg  6 mg Oral Daily Russella Dar, NP   6 mg at 02/17/22 1110   polyethylene glycol (MIRALAX / GLYCOLAX) packet 17 g  17 g Oral Daily PRN Lorin Glass, MD   17 g at 12/21/21 1038   pregabalin (LYRICA) capsule 25 mg  25 mg Oral QHS Russella Dar, NP   25 mg at 02/16/22 2153   QUEtiapine (SEROQUEL) tablet 50 mg  50 mg Oral QHS Max Fickle B, MD   50 mg at 02/16/22 2153   sacubitril-valsartan (ENTRESTO) 24-26 mg per tablet  1 tablet Oral BID Max Fickle B, MD   1 tablet at 02/17/22 1110   senna-docusate (Senokot-S) tablet 2 tablet  2 tablet Oral BID Max Fickle B, MD   2 tablet at 02/17/22 1110   sodium chloride tablet 2 g  2 g Oral BID WC Lupita Leash, MD   2 g at 02/17/22 4401     Discharge Medications: Please see discharge summary for a list of discharge medications.  Relevant Imaging Results:  Relevant Lab Results:   Additional Information SSN: [id number], current smoker  Janae Bridgeman, RN

## 2022-02-18 NOTE — Progress Notes (Signed)
  PROGRESS NOTE  Patient sitting in chair, walking hallways.  No acute events noted.  Vital signs remain stable. He remains on difficult to place list.   Noralee Stain, DO [facility] 02/18/2022, 11:10 AM  Available via Epic secure chat 7am-7pm After these hours, please refer to coverage provider listed on [email or web address]

## 2022-02-19 NOTE — Progress Notes (Signed)
  PROGRESS NOTE  No acute events reported.  Vital signs remain stable. He remains on difficult to place list.   Noralee Stain, DO [facility] 02/19/2022, 10:35 AM  Available via Epic secure chat 7am-7pm After these hours, please refer to coverage provider listed on [email or web address]

## 2022-02-20 DIAGNOSIS — E871 Hypo-osmolality and hyponatremia: Secondary | ICD-10-CM | POA: Diagnosis not present

## 2022-02-20 NOTE — TOC Progression Note (Addendum)
Transition of Care [facility]) - Progression Note    Patient Details  Name: Jim Dunn MRN: [id number] Date of Birth: 1966/01/21  [facility]) CM/SW Contact  Janae Bridgeman, RN Phone Number: 02/20/2022, 12:14 PM  Clinical Narrative:    CM was unable to leave a voicemail message with Elayne Guerin, Admissions director at [facility] since the voicemail was full but I sent a secure email requesting follow up regarding review of clinicals on Friday, 02/17/2022.  No bed offers for placement at this time.  Cottie Banda is the new [employer] Guardian for the patient - [phone], [phone].  CM and MSW with DTP Team will continue to follow the patient for [facility] for the patient - no bed offers at this time.  02/20/22 - 1220 - I spoke with Annabelle Harman, Executive Director with [facility] and the facility is unable to offer a bed to the patient for placement.  I called and left a message with Danna Hefty [facility] DSS Guardian to request continued efforts for placement.   Expected Discharge Plan: Assisted Living Barriers to Discharge:  (Pending placement by DSS Guardian)  Expected Discharge Plan and Services Expected Discharge Plan: Assisted Living In-house Referral: Clinical Social Work Discharge Planning Services: Medication Assistance, CM Consult Post Acute Care Choice:  (Needs [facility]/ALF placement) Living arrangements for the past 2 months: Single Family Home                                       Social Determinants of Health (SDOH) Interventions    Readmission Risk Interventions    01/18/2022    2:30 PM 01/23/2020    4:46 PM  Readmission Risk Prevention Plan  Transportation Screening Complete Complete  HRI or Home Care Consult  Complete  Social Work Consult for Recovery Care Planning/Counseling  Complete  Palliative Care Screening  Not Applicable  Medication Review [employer]) Complete   PCP or Specialist  appointment within 3-5 days of discharge Complete   HRI or Home Care Consult Complete   SW Recovery Care/Counseling Consult Complete   Palliative Care Screening Not Applicable   Skilled Nursing Facility Not Applicable
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
## 2022-02-21 DIAGNOSIS — E871 Hypo-osmolality and hyponatremia: Secondary | ICD-10-CM | POA: Diagnosis not present
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
## 2022-02-22 DIAGNOSIS — E871 Hypo-osmolality and hyponatremia: Secondary | ICD-10-CM | POA: Diagnosis not present

## 2022-02-22 NOTE — Progress Notes (Addendum)
PROGRESS NOTE    Davonta Stroot  ZDG:[id number] DOB: 1966/04/04 DOA: 09/24/2021 PCP: Oneita Hurt, No    Brief Narrative:   Jim Dunn is a 56 y.o. male with past medical history significant for paranoid schizophrenia, psychogenic polydipsia, HTN, chronic systolic CHF, history of seizures, COPD, alcohol/drug abuse, tobacco abuse who presented to [facility] ED on 1/21 via EMS for altered behavior per family.  Family thought his behavior was off due to possible low sodium has been drinking a lot of water.  Patient was also endorsing visual hallucinations.  Family also does not feel they can take care of him at home.  His sister reports that her biggest concern is that he burns a lot of things and urinates on things and does not recall events.  He requires constant supervision and assistance with much of his ADLs.  In the ED, temperature 9 9.2 F, BP 153/88, HR 88, RR 15, SPO2 95% on room air.  Sodium 129 (baseline 121-126), EtOH level less than 10, valproic acid level 19, serum osmolality 262.  Psychiatry, social work consulted.  Patient started IV fluids.  TRH consulted for admission.  Patient's hospital course was complicated by acute symptomatic hyponatremia due to excessive water intake and breakthrough seizures.  Currently patient is medically stable for discharge.  DSS guardian has determined the patient is unsafe to live independently and looking at options for placement at this time.  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 twice daily --Intermittent monitoring of sodium level  Major neurocognitive disorder PTSD Paranoid schizophrenia with hallucinations Dementia without behavioral disturbance Patient lacks decision-making capacity. --Seroquel 25 mg p.o. nightly --Depakote --Xanax 1 mg p.o. twice daily --Mirtazapine 15 mg p.o. nightly --Social work for  placement  Seizure disorder --Depakote ER 500 g p.o. twice daily --Lyrica 25 mg p.o. nightly  Chronic systolic congestive heart failure, compensated TTE 09/26/2021 with LVEF 30-35%, LV moderately decreased function, LV with global hypokinesis, LV mildly dilated, LV diastolic parameters normal, trivial MR, no aortic stenosis, IVC normal in size. --Digoxin 0.25 mg p.o. daily --Entresto 24-26 mg p.o. twice daily --Carvedilol 25 mg p.o. twice daily  Essential hypertension --Amlodipine 10 mg p.o. daily --Carvedilol --Entresto 24-26 mg p.o. twice daily  Hyperlipidemia --Atorvastatin 40 mg p.o. daily  Peripheral neuropathy --Lyrica 25 g p.o. nightly  Tobacco use disorder --Nicotine gum as needed  Obesity Body mass index is 39.12 kg/m. Discussed with patient needs for aggressive lifestyle changes/weight loss as this complicates all facets of care.  Outpatient follow-up with PCP.     DVT prophylaxis: Place and maintain sequential compression device Start: 11/01/21 1711    Code Status: Full Code Family Communication: No family present at bedside this morning.  Disposition Plan:  Level of care: Med-Surg Status is: Inpatient  Remains inpatient appropriate because: Pending placement which has been difficult per social work      Consultants:  Psychiatry  Procedures:  TTE  Antimicrobials:  None   Subjective: Patient seen examined bedside, sitting at edge of bed.  Ordering breakfast.  No complaints this morning.  Denies headache, no dizziness, no chest pain, no shortness of breath, no abdominal pain.  No acute events overnight per nursing staff.  Medically stable for discharge once placement found per social work.  Objective: Vitals:   02/21/22 2338 02/22/22 0331 02/22/22 0936 02/22/22 1143  BP: 135/72 111/66 (!) 121/96 134/79  Pulse: 86 73 77 89  Resp: 18 16 18 20   Temp: 98.2 F (36.8 C) 98 F (36.7 C)  98 F (36.7 C)  TempSrc: Oral Oral  Oral  SpO2: 96% 97% 99%  99%  Weight:      Height:        Intake/Output Summary (Last 24 hours) at 02/22/2022 1234 Last data filed at 02/22/2022 1000 Gross per 24 hour  Intake 960 ml  Output --  Net 960 ml   Filed Weights   01/09/22 0500 01/10/22 0432 01/11/22 0500  Weight: 117.5 kg 115.6 kg 116.7 kg    Examination:  Physical Exam: GEN: NAD, alert, obese HEENT: NCAT, PERRL, EOMI, sclera clear, MMM PULM: CTAB w/o wheezes/crackles, normal respiratory effort, on room air CV: RRR w/o M/G/R GI: abd soft, NTND, NABS, no R/G/M MSK: no peripheral edema, moves all extremities independently NEURO: CN II-XII intact, no focal deficits, sensation to light touch intact PSYCH: normal mood/affect Integumentary: dry/intact, no rashes or wounds    Data Reviewed: I have personally reviewed following labs and imaging studies  CBC: No results for input(s): "WBC", "NEUTROABS", "HGB", "HCT", "MCV", "PLT" in the last 168 hours. Basic Metabolic Panel: Recent Labs  Lab 02/16/22 0919  NA 136  K 4.2  CL 102  CO2 25  GLUCOSE 93  BUN 7  CREATININE 0.69  CALCIUM 9.1   GFR: Estimated Creatinine Clearance: 130.9 mL/min (by C-G formula based on SCr of 0.69 mg/dL). Liver Function Tests: No results for input(s): "AST", "ALT", "ALKPHOS", "BILITOT", "PROT", "ALBUMIN" in the last 168 hours. No results for input(s): "LIPASE", "AMYLASE" in the last 168 hours. No results for input(s): "AMMONIA" in the last 168 hours. Coagulation Profile: No results for input(s): "INR", "PROTIME" in the last 168 hours. Cardiac Enzymes: No results for input(s): "CKTOTAL", "CKMB", "CKMBINDEX", "TROPONINI" in the last 168 hours. BNP (last 3 results) No results for input(s): "PROBNP" in the last 8760 hours. HbA1C: No results for input(s): "HGBA1C" in the last 72 hours. CBG: No results for input(s): "GLUCAP" in the last 168 hours. Lipid Profile: No results for input(s): "CHOL", "HDL", "LDLCALC", "TRIG", "CHOLHDL", "LDLDIRECT" in the last 72  hours. Thyroid Function Tests: No results for input(s): "TSH", "T4TOTAL", "FREET4", "T3FREE", "THYROIDAB" in the last 72 hours. Anemia Panel: No results for input(s): "VITAMINB12", "FOLATE", "FERRITIN", "TIBC", "IRON", "RETICCTPCT" in the last 72 hours. Sepsis Labs: No results for input(s): "PROCALCITON", "LATICACIDVEN" in the last 168 hours.  No results found for this or any previous visit (from the past 240 hour(s)).       Radiology Studies: No results found.      Scheduled Meds:  ALPRAZolam  1 mg Oral BID   amLODipine  10 mg Oral Daily   atorvastatin  40 mg Oral Daily   carvedilol  25 mg Oral BID WC   digoxin  0.25 mg Oral Daily   divalproex  500 mg Oral BID   melatonin  3 mg Oral QHS   mirtazapine  15 mg Oral QHS   paliperidone  6 mg Oral Daily   pregabalin  25 mg Oral QHS   QUEtiapine  50 mg Oral QHS   sacubitril-valsartan  1 tablet Oral BID   senna-docusate  2 tablet Oral BID   sodium chloride  2 g Oral BID WC   Continuous Infusions:   LOS: 146 days    Time spent: 43 minutes spent on chart review, discussion with nursing staff, consultants, updating family and interview/physical exam; more than 50% of that time was spent in  counseling and/or coordination of care.    Alvira Philips [place], DO [facility] Available via Epic secure chat 7am-7pm After these hours, please refer to coverage provider listed on [email or web address] 02/22/2022, 12:34 PM

## 2022-02-22 NOTE — TOC Progression Note (Signed)
Transition of Care [facility]) - Progression Note    Patient Details  Name: Corrin Hingle MRN: [id number] Date of Birth: September 27, 1965  [facility]) CM/SW Contact  Janae Bridgeman, RN Phone Number: 02/22/2022, 8:30 AM  Clinical Narrative:    CM called and left a message with new [facility] [place], [phone] to request continued assistance with placement for this patient.  Unfortunately,  I do not have a current bed offer but will continue to explore options for placement.   Expected Discharge Plan: Assisted Living Barriers to Discharge:  (Pending placement by DSS Guardian)  Expected Discharge Plan and Services Expected Discharge Plan: Assisted Living In-house Referral: Clinical Social Work Discharge Planning Services: Medication Assistance, CM Consult Post Acute Care Choice:  (Needs [facility]/ALF placement) Living arrangements for the past 2 months: Single Family Home                                       Social Determinants of Health (SDOH) Interventions    Readmission Risk Interventions    01/18/2022    2:30 PM 01/23/2020    4:46 PM  Readmission Risk Prevention Plan  Transportation Screening Complete Complete  HRI or Home Care Consult  Complete  Social Work Consult for Recovery Care Planning/Counseling  Complete  Palliative Care Screening  Not Applicable  Medication Review [employer]) Complete   PCP or Specialist appointment within 3-5 days of discharge Complete   HRI or Home Care Consult Complete   SW Recovery Care/Counseling Consult Complete   Palliative Care Screening Not Applicable   Skilled Nursing Facility Not Applicable

## 2022-02-23 DIAGNOSIS — E871 Hypo-osmolality and hyponatremia: Secondary | ICD-10-CM | POA: Diagnosis not present

## 2022-02-23 NOTE — Progress Notes (Signed)
PROGRESS NOTE    Jim Dunn  [id number] DOB: [birth year] DOA: 09/24/2021 PCP: Merryl Hacker, No    Brief Narrative:   Jim Dunn is a 56 y.o. male with past medical history significant for paranoid schizophrenia, psychogenic polydipsia, HTN, chronic systolic CHF, history of seizures, COPD, alcohol/drug abuse, tobacco abuse who presented to [facility] ED on 1/21 via EMS for altered behavior per family.  Family thought his behavior was off due to possible low sodium has been drinking a lot of water.  Patient was also endorsing visual hallucinations.  Family also does not feel they can take care of him at home.  His sister reports that her biggest concern is that he burns a lot of things and urinates on things and does not recall events.  He requires constant supervision and assistance with much of his ADLs.  In the ED, temperature 9 9.2 F, BP 153/88, HR 88, RR 15, SPO2 95% on room air.  Sodium 129 (baseline 121-126), EtOH level less than 10, valproic acid level 19, serum osmolality 262.  Psychiatry, social work consulted.  Patient started IV fluids.  TRH consulted for admission.  Patient's hospital course was complicated by acute symptomatic hyponatremia due to excessive water intake and breakthrough seizures.  Currently patient is medically stable for discharge.  DSS guardian has determined the patient is unsafe to live independently and looking at options for placement at this time.  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 twice daily --Intermittent monitoring of sodium level  Major neurocognitive disorder PTSD Paranoid schizophrenia with hallucinations Dementia without behavioral disturbance Patient lacks decision-making capacity. --Seroquel 25 mg p.o. nightly --Depakote --Xanax 1 mg p.o. twice daily --Mirtazapine 15 mg p.o. nightly --Social work for  placement  Seizure disorder --Depakote ER 500 g p.o. twice daily --Lyrica 25 mg p.o. nightly  Chronic systolic congestive heart failure, compensated TTE 09/26/2021 with LVEF 30-35%, LV moderately decreased function, LV with global hypokinesis, LV mildly dilated, LV diastolic parameters normal, trivial MR, no aortic stenosis, IVC normal in size. --Digoxin 0.25 mg p.o. daily --Entresto 24-26 mg p.o. twice daily --Carvedilol 25 mg p.o. twice daily  Essential hypertension --Amlodipine 10 mg p.o. daily --Carvedilol --Entresto 24-26 mg p.o. twice daily  Hyperlipidemia --Atorvastatin 40 mg p.o. daily  Peripheral neuropathy --Lyrica 25 g p.o. nightly  Tobacco use disorder --Nicotine gum as needed  Obesity Body mass index is 39.12 kg/m. Discussed with patient needs for aggressive lifestyle changes/weight loss as this complicates all facets of care.  Outpatient follow-up with PCP.     DVT prophylaxis: Place and maintain sequential compression device Start: 11/01/21 1711    Code Status: Full Code Family Communication: No family present at bedside this morning.  Disposition Plan:  Level of care: Med-Surg Status is: Inpatient  Remains inpatient appropriate because: Pending placement which has been difficult per social work      Consultants:  Psychiatry  Procedures:  TTE  Antimicrobials:  None   Subjective: Patient seen examined, walking around unit.  No complaints this morning.  Denies headache, no dizziness, no chest pain, no shortness of breath, no abdominal pain.  No acute events overnight per nursing staff.  Medically stable for discharge once placement found per social work.  Objective: Vitals:   02/23/22 0005 02/23/22 0454 02/23/22 0758 02/23/22 1207  BP: (!) 144/86 136/70 (!) 111/92 140/86  Pulse: 79 72 72 83  Resp: 19 19 16  16  Temp: 98.5 F (36.9 C) 98.6 F (37 C) 97.6 F (36.4 C) 97.7 F (36.5 C)  TempSrc: Oral Oral Oral Oral  SpO2: 100% 98% 98% 95%   Weight:      Height:        Intake/Output Summary (Last 24 hours) at 02/23/2022 1310 Last data filed at 02/23/2022 1030 Gross per 24 hour  Intake 1360 ml  Output --  Net 1360 ml   Filed Weights   01/09/22 0500 01/10/22 0432 01/11/22 0500  Weight: 117.5 kg 115.6 kg 116.7 kg    Examination:  Physical Exam: GEN: NAD, alert, obese HEENT: NCAT, PERRL, EOMI, sclera clear, MMM PULM: CTAB w/o wheezes/crackles, normal respiratory effort, on room air CV: RRR w/o M/G/R GI: abd soft, NTND, NABS, no R/G/M MSK: no peripheral edema, moves all extremities independently NEURO: CN II-XII intact, no focal deficits, sensation to light touch intact PSYCH: normal mood/affect Integumentary: dry/intact, no rashes or wounds    Data Reviewed: I have personally reviewed following labs and imaging studies  CBC: No results for input(s): "WBC", "NEUTROABS", "HGB", "HCT", "MCV", "PLT" in the last 168 hours. Basic Metabolic Panel: No results for input(s): "NA", "K", "CL", "CO2", "GLUCOSE", "BUN", "CREATININE", "CALCIUM", "MG", "PHOS" in the last 168 hours.  GFR: Estimated Creatinine Clearance: 130.9 mL/min (by C-G formula based on SCr of 0.69 mg/dL). Liver Function Tests: No results for input(s): "AST", "ALT", "ALKPHOS", "BILITOT", "PROT", "ALBUMIN" in the last 168 hours. No results for input(s): "LIPASE", "AMYLASE" in the last 168 hours. No results for input(s): "AMMONIA" in the last 168 hours. Coagulation Profile: No results for input(s): "INR", "PROTIME" in the last 168 hours. Cardiac Enzymes: No results for input(s): "CKTOTAL", "CKMB", "CKMBINDEX", "TROPONINI" in the last 168 hours. BNP (last 3 results) No results for input(s): "PROBNP" in the last 8760 hours. HbA1C: No results for input(s): "HGBA1C" in the last 72 hours. CBG: No results for input(s): "GLUCAP" in the last 168 hours. Lipid Profile: No results for input(s): "CHOL", "HDL", "LDLCALC", "TRIG", "CHOLHDL", "LDLDIRECT" in the last  72 hours. Thyroid Function Tests: No results for input(s): "TSH", "T4TOTAL", "FREET4", "T3FREE", "THYROIDAB" in the last 72 hours. Anemia Panel: No results for input(s): "VITAMINB12", "FOLATE", "FERRITIN", "TIBC", "IRON", "RETICCTPCT" in the last 72 hours. Sepsis Labs: No results for input(s): "PROCALCITON", "LATICACIDVEN" in the last 168 hours.  No results found for this or any previous visit (from the past 240 hour(s)).       Radiology Studies: No results found.      Scheduled Meds:  ALPRAZolam  1 mg Oral BID   amLODipine  10 mg Oral Daily   atorvastatin  40 mg Oral Daily   carvedilol  25 mg Oral BID WC   digoxin  0.25 mg Oral Daily   divalproex  500 mg Oral BID   melatonin  3 mg Oral QHS   mirtazapine  15 mg Oral QHS   paliperidone  6 mg Oral Daily   pregabalin  25 mg Oral QHS   QUEtiapine  50 mg Oral QHS   sacubitril-valsartan  1 tablet Oral BID   senna-docusate  2 tablet Oral BID   sodium chloride  2 g Oral BID WC   Continuous Infusions:   LOS: 147 days    Time spent: 43 minutes spent on chart review, discussion with nursing staff, consultants, updating family and interview/physical exam; more than 50% of that time was spent in counseling and/or coordination of care.    Alvira Philips [place], DO [facility] Available via Epic secure chat 7am-7pm After these hours, please refer to coverage provider listed on [email or web address] 02/23/2022, 1:10 PM

## 2022-02-24 DIAGNOSIS — E871 Hypo-osmolality and hyponatremia: Secondary | ICD-10-CM | POA: Diagnosis not present

## 2022-02-24 MED ORDER — POLYETHYLENE GLYCOL 3350 17 G PO PACK
17.0000 g | PACK | Freq: Every day | ORAL | Status: DC
  Administered 2022-02-25 – 2022-02-26 (×2): 17 g via ORAL
  Filled 2022-02-24 (×2): qty 1

## 2022-02-24 NOTE — Progress Notes (Signed)
PROGRESS NOTE    Jim Dunn  ZDG:[id number] DOB: 24-May-1966 DOA: 09/24/2021 PCP: Oneita Hurt, No    Brief Narrative:   Jim Dunn is a 56 y.o. male with past medical history significant for paranoid schizophrenia, psychogenic polydipsia, HTN, chronic systolic CHF, history of seizures, COPD, alcohol/drug abuse, tobacco abuse who presented to [facility] ED on 1/21 via EMS for altered behavior per family.  Family thought his behavior was off due to possible low sodium has been drinking a lot of water.  Patient was also endorsing visual hallucinations.  Family also does not feel they can take care of him at home.  His sister reports that her biggest concern is that he burns a lot of things and urinates on things and does not recall events.  He requires constant supervision and assistance with much of his ADLs.  In the ED, temperature 9 9.2 F, BP 153/88, HR 88, RR 15, SPO2 95% on room air.  Sodium 129 (baseline 121-126), EtOH level less than 10, valproic acid level 19, serum osmolality 262.  Psychiatry, social work consulted.  Patient started IV fluids.  TRH consulted for admission.  Patient's hospital course was complicated by acute symptomatic hyponatremia due to excessive water intake and breakthrough seizures.  Currently patient is medically stable for discharge.  DSS guardian has determined the patient is unsafe to live independently and looking at options for placement at this time.  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 twice daily --Intermittent monitoring of sodium level  Major neurocognitive disorder PTSD Paranoid schizophrenia with hallucinations Dementia without behavioral disturbance Patient lacks decision-making capacity. --Seroquel 25 mg p.o. nightly --Depakote --Xanax 1 mg p.o. twice daily --Mirtazapine 15 mg p.o. nightly --Social work for  placement  Seizure disorder --Depakote ER 500 g p.o. twice daily --Lyrica 25 mg p.o. nightly  Chronic systolic congestive heart failure, compensated TTE 09/26/2021 with LVEF 30-35%, LV moderately decreased function, LV with global hypokinesis, LV mildly dilated, LV diastolic parameters normal, trivial MR, no aortic stenosis, IVC normal in size. --Digoxin 0.25 mg p.o. daily --Entresto 24-26 mg p.o. twice daily --Carvedilol 25 mg p.o. twice daily  Essential hypertension --Amlodipine 10 mg p.o. daily --Carvedilol --Entresto 24-26 mg p.o. twice daily  Hyperlipidemia --Atorvastatin 40 mg p.o. daily  Peripheral neuropathy --Lyrica 25 g p.o. nightly  Tobacco use disorder --Nicotine gum as needed  Obesity Body mass index is 39.12 kg/m. Discussed with patient needs for aggressive lifestyle changes/weight loss as this complicates all facets of care.  Outpatient follow-up with PCP.     DVT prophylaxis: Place and maintain sequential compression device Start: 11/01/21 1711    Code Status: Full Code Family Communication: No family present at bedside this morning.  Disposition Plan:  Level of care: Med-Surg Status is: Inpatient  Remains inpatient appropriate because: Pending placement which has been difficult per social work      Consultants:  Psychiatry  Procedures:  TTE  Antimicrobials:  None   Subjective: Patient seen examined at bedside, sitting at edge of bed.  Just ordered breakfast.  No complaints this morning.  States "enjoys walking around the unit". No acute events overnight per nursing staff.  Medically stable for discharge once placement found per social work.  Objective: Vitals:   02/23/22 1207 02/23/22 1604 02/23/22 1950 02/24/22 0809  BP: 140/86 (!) 149/85 140/87 (!) 145/96  Pulse: 83 85 70 73  Resp: 16 16 18  18  Temp: 97.7 F (36.5 C) 97.7 F (36.5 C) 97.6 F (36.4 C) 97.8 F (36.6 C)  TempSrc: Oral Oral Oral Oral  SpO2: 95% 98% 99% 100%  Weight:       Height:        Intake/Output Summary (Last 24 hours) at 02/24/2022 0935 Last data filed at 02/23/2022 2210 Gross per 24 hour  Intake 480 ml  Output --  Net 480 ml   Filed Weights   01/09/22 0500 01/10/22 0432 01/11/22 0500  Weight: 117.5 kg 115.6 kg 116.7 kg    Examination:  Physical Exam: GEN: NAD, alert, obese HEENT: NCAT, PERRL, EOMI, sclera clear, MMM PULM: CTAB w/o wheezes/crackles, normal respiratory effort, on room air CV: RRR w/o M/G/R GI: abd soft, NTND, NABS, no R/G/M MSK: no peripheral edema, moves all extremities independently NEURO: CN II-XII intact, no focal deficits, sensation to light touch intact PSYCH: normal mood/affect Integumentary: dry/intact, no rashes or wounds    Data Reviewed: I have personally reviewed following labs and imaging studies  CBC: No results for input(s): "WBC", "NEUTROABS", "HGB", "HCT", "MCV", "PLT" in the last 168 hours. Basic Metabolic Panel: No results for input(s): "NA", "K", "CL", "CO2", "GLUCOSE", "BUN", "CREATININE", "CALCIUM", "MG", "PHOS" in the last 168 hours.  GFR: Estimated Creatinine Clearance: 130.9 mL/min (by C-G formula based on SCr of 0.69 mg/dL). Liver Function Tests: No results for input(s): "AST", "ALT", "ALKPHOS", "BILITOT", "PROT", "ALBUMIN" in the last 168 hours. No results for input(s): "LIPASE", "AMYLASE" in the last 168 hours. No results for input(s): "AMMONIA" in the last 168 hours. Coagulation Profile: No results for input(s): "INR", "PROTIME" in the last 168 hours. Cardiac Enzymes: No results for input(s): "CKTOTAL", "CKMB", "CKMBINDEX", "TROPONINI" in the last 168 hours. BNP (last 3 results) No results for input(s): "PROBNP" in the last 8760 hours. HbA1C: No results for input(s): "HGBA1C" in the last 72 hours. CBG: No results for input(s): "GLUCAP" in the last 168 hours. Lipid Profile: No results for input(s): "CHOL", "HDL", "LDLCALC", "TRIG", "CHOLHDL", "LDLDIRECT" in the last 72  hours. Thyroid Function Tests: No results for input(s): "TSH", "T4TOTAL", "FREET4", "T3FREE", "THYROIDAB" in the last 72 hours. Anemia Panel: No results for input(s): "VITAMINB12", "FOLATE", "FERRITIN", "TIBC", "IRON", "RETICCTPCT" in the last 72 hours. Sepsis Labs: No results for input(s): "PROCALCITON", "LATICACIDVEN" in the last 168 hours.  No results found for this or any previous visit (from the past 240 hour(s)).       Radiology Studies: No results found.      Scheduled Meds:  ALPRAZolam  1 mg Oral BID   amLODipine  10 mg Oral Daily   atorvastatin  40 mg Oral Daily   carvedilol  25 mg Oral BID WC   digoxin  0.25 mg Oral Daily   divalproex  500 mg Oral BID   melatonin  3 mg Oral QHS   mirtazapine  15 mg Oral QHS   paliperidone  6 mg Oral Daily   pregabalin  25 mg Oral QHS   QUEtiapine  50 mg Oral QHS   sacubitril-valsartan  1 tablet Oral BID   senna-docusate  2 tablet Oral BID   sodium chloride  2 g Oral BID WC   Continuous Infusions:   LOS: 148 days    Time spent: 43 minutes spent on chart review, discussion with nursing staff, consultants, updating family and interview/physical exam; more than 50% of that time was spent in counseling and/or coordination of care.    Alvira Philips [place], DO [facility] Available  via Epic secure chat 7am-7pm After these hours, please refer to coverage provider listed on [email or web address] 02/24/2022, 9:35 AM

## 2022-02-25 DIAGNOSIS — E871 Hypo-osmolality and hyponatremia: Secondary | ICD-10-CM | POA: Diagnosis not present

## 2022-02-25 MED ORDER — PREGABALIN 50 MG PO CAPS
50.0000 mg | ORAL_CAPSULE | Freq: Two times a day (BID) | ORAL | Status: DC
  Administered 2022-02-25 – 2022-03-10 (×27): 50 mg via ORAL
  Filled 2022-02-25 (×27): qty 1

## 2022-02-25 MED ORDER — GABAPENTIN 100 MG PO CAPS
100.0000 mg | ORAL_CAPSULE | Freq: Three times a day (TID) | ORAL | Status: DC
Start: 2022-02-25 — End: 2022-02-25

## 2022-02-26 DIAGNOSIS — E871 Hypo-osmolality and hyponatremia: Secondary | ICD-10-CM | POA: Diagnosis not present

## 2022-02-26 MED ORDER — POLYETHYLENE GLYCOL 3350 17 G PO PACK
17.0000 g | PACK | Freq: Two times a day (BID) | ORAL | Status: DC
  Administered 2022-02-26 – 2022-04-06 (×61): 17 g via ORAL
  Filled 2022-02-26 (×71): qty 1

## 2022-02-26 MED ORDER — ORAL CARE MOUTH RINSE: 15.0000 mL | OROMUCOSAL | Status: DC | PRN

## 2022-02-27 DIAGNOSIS — E871 Hypo-osmolality and hyponatremia: Secondary | ICD-10-CM | POA: Diagnosis not present

## 2022-02-27 LAB — BASIC METABOLIC PANEL
Anion gap: 7 (ref 5–15)
BUN: 10 mg/dL (ref 6–20)
CO2: 24 mmol/L (ref 22–32)
Calcium: 8.4 mg/dL — ABNORMAL LOW (ref 8.9–10.3)
Chloride: 99 mmol/L (ref 98–111)
Creatinine, Ser: 0.64 mg/dL (ref 0.61–1.24)
GFR, Estimated: >60 mL/min (ref >60)
Glucose, Bld: 104 mg/dL — ABNORMAL HIGH (ref 70–99)
Potassium: 4 mmol/L (ref 3.5–5.1)
Sodium: 130 mmol/L — ABNORMAL LOW (ref 135–145)

## 2022-02-27 LAB — CBC
HCT: 34.6 % — ABNORMAL LOW (ref 39.0–52.0)
Hemoglobin: 11.4 g/dL — ABNORMAL LOW (ref 13.0–17.0)
MCH: 23.1 pg — ABNORMAL LOW (ref 26.0–34.0)
MCHC: 32.9 g/dL (ref 30.0–36.0)
MCV: 70 fL — ABNORMAL LOW (ref 80.0–100.0)
Platelets: 148 10*3/uL — ABNORMAL LOW (ref 150–400)
RBC: 4.94 MIL/uL (ref 4.22–5.81)
RDW: 15.9 % — ABNORMAL HIGH (ref 11.5–15.5)
WBC: 7.4 10*3/uL (ref 4.0–10.5)
nRBC: 0 % (ref 0.0–0.2)

## 2022-02-27 MED ORDER — SODIUM CHLORIDE 1 G PO TABS
2.0000 g | ORAL_TABLET | Freq: Three times a day (TID) | ORAL | Status: DC
  Administered 2022-02-27 – 2022-03-23 (×74): 2 g via ORAL
  Filled 2022-02-27 (×75): qty 2

## 2022-02-28 DIAGNOSIS — E871 Hypo-osmolality and hyponatremia: Secondary | ICD-10-CM | POA: Diagnosis not present

## 2022-02-28 LAB — BASIC METABOLIC PANEL
Anion gap: 11 (ref 5–15)
BUN: 6 mg/dL (ref 6–20)
CO2: 24 mmol/L (ref 22–32)
Calcium: 9.3 mg/dL (ref 8.9–10.3)
Chloride: 101 mmol/L (ref 98–111)
Creatinine, Ser: 0.7 mg/dL (ref 0.61–1.24)
GFR, Estimated: >60 mL/min (ref >60)
Glucose, Bld: 125 mg/dL — ABNORMAL HIGH (ref 70–99)
Potassium: 3.9 mmol/L (ref 3.5–5.1)
Sodium: 136 mmol/L (ref 135–145)

## 2022-02-28 NOTE — Progress Notes (Addendum)
2:50pm: CSW spoke with [name] of [name] who states the agency is still pursuing placement for this patient but the biggest issue at this time is his income not being enough for ALF placement. [name] is working with Rodena Medin, Adult Program Manager to work out financial agreement.  9:30am: CSW spoke with patient at bedside to discuss his discharge plan.   CSW attempted to reach patient's legal guardian Cottie Banda at [place] [facility] without success - a voicemail was left requesting a return call.  CSW spoke with Maralyn Sago at [employer] who states the patient's information is still under review at this time. Sarah to obtain an answer from administration and will return call to CSW.  [name], MSW, LCSW Transitions of Care  Clinical Social Worker II [phone]

## 2022-03-01 DIAGNOSIS — E871 Hypo-osmolality and hyponatremia: Secondary | ICD-10-CM | POA: Diagnosis not present

## 2022-03-01 LAB — BASIC METABOLIC PANEL
Anion gap: 11 (ref 5–15)
BUN: 9 mg/dL (ref 6–20)
CO2: 24 mmol/L (ref 22–32)
Calcium: 9.3 mg/dL (ref 8.9–10.3)
Chloride: 102 mmol/L (ref 98–111)
Creatinine, Ser: 0.62 mg/dL (ref 0.61–1.24)
GFR, Estimated: >60 mL/min (ref >60)
Glucose, Bld: 78 mg/dL (ref 70–99)
Potassium: 4.3 mmol/L (ref 3.5–5.1)
Sodium: 137 mmol/L (ref 135–145)

## 2022-03-01 NOTE — Progress Notes (Signed)
PROGRESS NOTE    Jim Dunn  CZY:[id number] DOB: February 08, 1966 DOA: 09/24/2021 PCP: Oneita Hurt, No    Brief Narrative:   Kurtis Anastasia is Jim Dunn 56 y.o. male with past medical history significant for paranoid schizophrenia, psychogenic polydipsia, HTN, chronic systolic CHF, history of seizures, COPD, alcohol/drug abuse, tobacco abuse who presented to [facility] ED on 1/21 via EMS for altered behavior per family.  Family thought his behavior was off due to possible low sodium has been drinking Zahari Fazzino lot of water.  Patient was also endorsing visual hallucinations.  Family also does not feel they can take care of him at home.  His sister reports that her biggest concern is that he burns Mickal Meno lot of things and urinates on things and does not recall events.  He requires constant supervision and assistance with much of his ADLs.  In the ED, temperature 9 9.2 F, BP 153/88, HR 88, RR 15, SPO2 95% on room air.  Sodium 129 (baseline 121-126), EtOH level less than 10, valproic acid level 19, serum osmolality 262.  Psychiatry, social work consulted.  Patient started IV fluids.  TRH consulted for admission.  Patient's hospital course was complicated by acute symptomatic hyponatremia due to excessive water intake and breakthrough seizures.  Currently patient is medically stable for discharge.  DSS guardian has determined the patient is unsafe to live independently and looking at options for placement at this time.  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 TID on 6/26 (was 2g BID) -- Continue intermittent monitoring of electrolytes  Major neurocognitive disorder PTSD Paranoid schizophrenia with hallucinations Dementia without behavioral disturbance Patient lacks decision-making capacity. --Seroquel 25 mg p.o. nightly --Depakote ER 500mg  PO BID --Invega 6mg  PO daily --Xanax 1 mg p.o. twice  daily --Mirtazapine 15 mg p.o. nightly --Social work for placement  Seizure disorder --Depakote ER 500 g p.o. twice daily  Chronic systolic congestive heart failure, compensated TTE 09/26/2021 with LVEF 30-35%, LV moderately decreased function, LV with global hypokinesis, LV mildly dilated, LV diastolic parameters normal, trivial MR, no aortic stenosis, IVC normal in size. --Digoxin 0.25 mg p.o. daily --Entresto 24-26 mg p.o. twice daily --Carvedilol 25 mg p.o. twice daily  Essential hypertension --Amlodipine 10 mg p.o. daily --Carvedilol --Entresto 24-26 mg p.o. twice daily  Hyperlipidemia --Atorvastatin 40 mg p.o. daily  Peripheral neuropathy --Lyrica increased to 50 mg p.o. BID  Tobacco use disorder --Nicotine gum as needed  Obesity Body mass index is 41.25 kg/m. Outpatient follow-up with PCP.     DVT prophylaxis: Place and maintain sequential compression device Start: 11/01/21 1711    Code Status: Full Code Family Communication: No family present at bedside this morning.  Disposition Plan:  Level of care: Med-Surg Status is: Inpatient  Remains inpatient appropriate because: Pending placement which has been difficult per social work   Consultants:  Psychiatry  Procedures:  TTE  Antimicrobials:  None   Subjective: Sleeping, did not wake him today  Objective: Vitals:   02/28/22 1930 03/01/22 0837 03/01/22 1616 03/01/22 1617  BP: 118/90 110/74 127/66 127/66  Pulse: 79 71 80 87  Resp: 18 18    Temp: 98.5 F (36.9 C) 98.6 F (37 C)    TempSrc: Oral     SpO2: 95% 97%  97%  Weight:      Height:        Intake/Output Summary (Last 24 hours) at 03/01/2022 1912 Last data  filed at 03/01/2022 1614 Gross per 24 hour  Intake 1080 ml  Output --  Net 1080 ml   Filed Weights   01/10/22 0432 01/11/22 0500 02/27/22 1700  Weight: 115.6 kg 116.7 kg 123.1 kg    Examination:  Physical Exam: General: No acute distress. Snoring respirations,  sleeping. Lungs: unlabored, snoring respirations Neurological: sleeping Skin: No visible rashes or lesions. Extremities: No clubbing or cyanosis. No edema   Data Reviewed: I have personally reviewed following labs and imaging studies  CBC: Recent Labs  Lab 02/27/22 0029  WBC 7.4  HGB 11.4*  HCT 34.6*  MCV 70.0*  PLT 148*   Basic Metabolic Panel: Recent Labs  Lab 02/27/22 0029 02/28/22 1355 03/01/22 1117  NA 130* 136 137  K 4.0 3.9 4.3  CL 99 101 102  CO2 24 24 24   GLUCOSE 104* 125* 78  BUN 10 6 9   CREATININE 0.64 0.70 0.62  CALCIUM 8.4* 9.3 9.3    GFR: Estimated Creatinine Clearance: 134.8 mL/min (by C-G formula based on SCr of 0.62 mg/dL). Liver Function Tests: No results for input(s): "AST", "ALT", "ALKPHOS", "BILITOT", "PROT", "ALBUMIN" in the last 168 hours. No results for input(s): "LIPASE", "AMYLASE" in the last 168 hours. No results for input(s): "AMMONIA" in the last 168 hours. Coagulation Profile: No results for input(s): "INR", "PROTIME" in the last 168 hours. Cardiac Enzymes: No results for input(s): "CKTOTAL", "CKMB", "CKMBINDEX", "TROPONINI" in the last 168 hours. BNP (last 3 results) No results for input(s): "PROBNP" in the last 8760 hours. HbA1C: No results for input(s): "HGBA1C" in the last 72 hours. CBG: No results for input(s): "GLUCAP" in the last 168 hours. Lipid Profile: No results for input(s): "CHOL", "HDL", "LDLCALC", "TRIG", "CHOLHDL", "LDLDIRECT" in the last 72 hours. Thyroid Function Tests: No results for input(s): "TSH", "T4TOTAL", "FREET4", "T3FREE", "THYROIDAB" in the last 72 hours. Anemia Panel: No results for input(s): "VITAMINB12", "FOLATE", "FERRITIN", "TIBC", "IRON", "RETICCTPCT" in the last 72 hours. Sepsis Labs: No results for input(s): "PROCALCITON", "LATICACIDVEN" in the last 168 hours.  No results found for this or any previous visit (from the past 240 hour(s)).       Radiology Studies: No results  found.      Scheduled Meds:  ALPRAZolam  1 mg Oral BID   amLODipine  10 mg Oral Daily   atorvastatin  40 mg Oral Daily   carvedilol  25 mg Oral BID WC   digoxin  0.25 mg Oral Daily   divalproex  500 mg Oral BID   melatonin  3 mg Oral QHS   mirtazapine  15 mg Oral QHS   paliperidone  6 mg Oral Daily   polyethylene glycol  17 g Oral BID   pregabalin  50 mg Oral BID   QUEtiapine  50 mg Oral QHS   sacubitril-valsartan  1 tablet Oral BID   senna-docusate  2 tablet Oral BID   sodium chloride  2 g Oral TID WC   Continuous Infusions:   LOS: 153 days    Time spent: 15 min    , DO [facility] Available via Epic secure chat 7am-7pm After these hours, please refer to coverage provider listed on [email or web address] 03/01/2022, 7:12 PM

## 2022-03-01 NOTE — Progress Notes (Signed)
CSW spoke with patient at bedside to inform him of the conversation that occurred yesterday between CSW and his legal guardian [name] at DSS regarding the barriers to obtaining placement at this time. Patient did not have questions and stated understanding.  [name], MSW, LCSW Transitions of Care  Clinical Social Worker II [phone]

## 2022-03-02 DIAGNOSIS — E871 Hypo-osmolality and hyponatremia: Secondary | ICD-10-CM | POA: Diagnosis not present

## 2022-03-02 LAB — CBC WITH DIFFERENTIAL/PLATELET
Abs Immature Granulocytes: 0 10*3/uL (ref 0.00–0.07)
Basophils Absolute: 0 10*3/uL (ref 0.0–0.1)
Basophils Relative: 0 %
Eosinophils Absolute: 0 10*3/uL (ref 0.0–0.5)
Eosinophils Relative: 0 %
HCT: 36.5 % — ABNORMAL LOW (ref 39.0–52.0)
Hemoglobin: 12.4 g/dL — ABNORMAL LOW (ref 13.0–17.0)
Lymphocytes Relative: 60 %
Lymphs Abs: 3.9 10*3/uL (ref 0.7–4.0)
MCH: 23.4 pg — ABNORMAL LOW (ref 26.0–34.0)
MCHC: 34 g/dL (ref 30.0–36.0)
MCV: 68.9 fL — ABNORMAL LOW (ref 80.0–100.0)
Monocytes Absolute: 0.5 10*3/uL (ref 0.1–1.0)
Monocytes Relative: 8 %
Neutro Abs: 2.1 10*3/uL (ref 1.7–7.7)
Neutrophils Relative %: 32 %
Platelets: 160 10*3/uL (ref 150–400)
RBC: 5.3 MIL/uL (ref 4.22–5.81)
RDW: 16 % — ABNORMAL HIGH (ref 11.5–15.5)
WBC: 6.5 10*3/uL (ref 4.0–10.5)
nRBC: 0 % (ref 0.0–0.2)
nRBC: 1 /100 WBC — ABNORMAL HIGH

## 2022-03-02 LAB — COMPREHENSIVE METABOLIC PANEL
ALT: 11 U/L (ref 0–44)
AST: 17 U/L (ref 15–41)
Albumin: 3.6 g/dL (ref 3.5–5.0)
Alkaline Phosphatase: 52 U/L (ref 38–126)
Anion gap: 7 (ref 5–15)
BUN: 12 mg/dL (ref 6–20)
CO2: 24 mmol/L (ref 22–32)
Calcium: 8.7 mg/dL — ABNORMAL LOW (ref 8.9–10.3)
Chloride: 105 mmol/L (ref 98–111)
Creatinine, Ser: 0.72 mg/dL (ref 0.61–1.24)
GFR, Estimated: >60 mL/min (ref >60)
Glucose, Bld: 90 mg/dL (ref 70–99)
Potassium: 4.2 mmol/L (ref 3.5–5.1)
Sodium: 136 mmol/L (ref 135–145)
Total Bilirubin: 0.4 mg/dL (ref 0.3–1.2)
Total Protein: 6.3 g/dL — ABNORMAL LOW (ref 6.5–8.1)

## 2022-03-02 LAB — PHOSPHORUS: Phosphorus: 4.5 mg/dL (ref 2.5–4.6)

## 2022-03-02 LAB — MAGNESIUM: Magnesium: 2 mg/dL (ref 1.7–2.4)

## 2022-03-02 NOTE — Progress Notes (Signed)
CSW spoke with Jim Dunn at [employer] who states the administrator is considering this patient for possible admission but needs to work out the financial agreement with DSS also.  [name], MSW, LCSW Transitions of Care  Clinical Social Worker II [phone]

## 2022-03-02 NOTE — Progress Notes (Signed)
PROGRESS NOTE    Jim Dunn  JXB:[id number] DOB: [birth year] DOA: 09/24/2021 PCP: Oneita Hurt, No    Brief Narrative:   Jim Dunn is Jim Dunn 56 y.o. male with past medical history significant for paranoid schizophrenia, psychogenic polydipsia, HTN, chronic systolic CHF, history of seizures, COPD, alcohol/drug abuse, tobacco abuse who presented to [facility] ED on 1/21 via EMS for altered behavior per family.  Family thought his behavior was off due to possible low sodium has been drinking Jim Dunn lot of water.  Patient was also endorsing visual hallucinations.  Family also does not feel they can take care of him at home.  His sister reports that her biggest concern is that he burns Jim Dunn lot of things and urinates on things and does not recall events.  He requires constant supervision and assistance with much of his ADLs.  In the ED, temperature 9 9.2 F, BP 153/88, HR 88, RR 15, SPO2 95% on room air.  Sodium 129 (baseline 121-126), EtOH level less than 10, valproic acid level 19, serum osmolality 262.  Psychiatry, social work consulted.  Patient started IV fluids.  TRH consulted for admission.  Patient's hospital course was complicated by acute symptomatic hyponatremia due to excessive water intake and breakthrough seizures.  Currently patient is medically stable for discharge.  DSS guardian has determined the patient is unsafe to live independently and looking at options for placement at this time.  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 TID on 6/26 (was 2g BID) -- Continue intermittent monitoring of electrolytes  Major neurocognitive disorder PTSD Paranoid schizophrenia with hallucinations Dementia without behavioral disturbance Patient lacks decision-making capacity. --Seroquel 25 mg p.o. nightly --Depakote ER 500mg  PO BID --Invega 6mg  PO daily --Xanax 1 mg p.o. twice  daily --Mirtazapine 15 mg p.o. nightly --Social work for placement  Seizure disorder --Depakote ER 500 g p.o. twice daily  Chronic systolic congestive heart failure, compensated TTE 09/26/2021 with LVEF 30-35%, LV moderately decreased function, LV with global hypokinesis, LV mildly dilated, LV diastolic parameters normal, trivial MR, no aortic stenosis, IVC normal in size. --Digoxin 0.25 mg p.o. daily --Entresto 24-26 mg p.o. twice daily --Carvedilol 25 mg p.o. twice daily  Essential hypertension --Amlodipine 10 mg p.o. daily --Carvedilol --Entresto 24-26 mg p.o. twice daily  Hyperlipidemia --Atorvastatin 40 mg p.o. daily  Peripheral neuropathy --Lyrica increased to 50 mg p.o. BID  Tobacco use disorder --Nicotine gum as needed  Obesity Body mass index is 41.25 kg/m. Outpatient follow-up with PCP.     DVT prophylaxis: Place and maintain sequential compression device Start: 11/01/21 1711    Code Status: Full Code Family Communication: No family present at bedside this morning.  Disposition Plan:  Level of care: Med-Surg Status is: Inpatient  Remains inpatient appropriate because: Pending placement which has been difficult per social work   Consultants:  Psychiatry  Procedures:  TTE  Antimicrobials:  None   Subjective: No complaints  Objective: Vitals:   03/01/22 2348 03/02/22 0324 03/02/22 0811 03/02/22 1513  BP: 132/79 116/72 (!) 150/96 (!) 132/91  Pulse: 80 71 72 89  Resp: 17 17 18 18   Temp: 98.3 F (36.8 C) 97.9 F (36.6 C) 98.6 F (37 C) 98.5 F (36.9 C)  TempSrc: Oral Oral    SpO2: 98% 93% 99% 99%  Weight:      Height:        Intake/Output Summary (Last 24 hours) at  03/02/2022 1527 Last data filed at 03/02/2022 1012 Gross per 24 hour  Intake 720 ml  Output --  Net 720 ml   Filed Weights   01/10/22 0432 01/11/22 0500 02/27/22 1700  Weight: 115.6 kg 116.7 kg 123.1 kg    Examination:  Physical Exam: General: No acute distress. Seen  walking halls.   Cardiovascular: RRR Lungs: unlabored Abdomen: Soft, nontender, nondistended  Neurological: Alert. Moves all extremities 4 with equal strength. Cranial nerves II through XII grossly intact. Extremities: No clubbing or cyanosis. No edema.   Data Reviewed: I have personally reviewed following labs and imaging studies  CBC: Recent Labs  Lab 02/27/22 0029 03/02/22 0800  WBC 7.4 6.5  NEUTROABS  --  2.1  HGB 11.4* 12.4*  HCT 34.6* 36.5*  MCV 70.0* 68.9*  PLT 148* 160   Basic Metabolic Panel: Recent Labs  Lab 02/27/22 0029 02/28/22 1355 03/01/22 1117 03/02/22 0800  NA 130* 136 137 136  K 4.0 3.9 4.3 4.2  CL 99 101 102 105  CO2 24 24 24 24   GLUCOSE 104* 125* 78 90  BUN 10 6 9 12   CREATININE 0.64 0.70 0.62 0.72  CALCIUM 8.4* 9.3 9.3 8.7*  MG  --   --   --  2.0  PHOS  --   --   --  4.5    GFR: Estimated Creatinine Clearance: 134.8 mL/min (by C-G formula based on SCr of 0.72 mg/dL). Liver Function Tests: Recent Labs  Lab 03/02/22 0800  AST 17  ALT 11  ALKPHOS 52  BILITOT 0.4  PROT 6.3*  ALBUMIN 3.6   No results for input(s): "LIPASE", "AMYLASE" in the last 168 hours. No results for input(s): "AMMONIA" in the last 168 hours. Coagulation Profile: No results for input(s): "INR", "PROTIME" in the last 168 hours. Cardiac Enzymes: No results for input(s): "CKTOTAL", "CKMB", "CKMBINDEX", "TROPONINI" in the last 168 hours. BNP (last 3 results) No results for input(s): "PROBNP" in the last 8760 hours. HbA1C: No results for input(s): "HGBA1C" in the last 72 hours. CBG: No results for input(s): "GLUCAP" in the last 168 hours. Lipid Profile: No results for input(s): "CHOL", "HDL", "LDLCALC", "TRIG", "CHOLHDL", "LDLDIRECT" in the last 72 hours. Thyroid Function Tests: No results for input(s): "TSH", "T4TOTAL", "FREET4", "T3FREE", "THYROIDAB" in the last 72 hours. Anemia Panel: No results for input(s): "VITAMINB12", "FOLATE", "FERRITIN", "TIBC", "IRON",  "RETICCTPCT" in the last 72 hours. Sepsis Labs: No results for input(s): "PROCALCITON", "LATICACIDVEN" in the last 168 hours.  No results found for this or any previous visit (from the past 240 hour(s)).       Radiology Studies: No results found.      Scheduled Meds:  ALPRAZolam  1 mg Oral BID   amLODipine  10 mg Oral Daily   atorvastatin  40 mg Oral Daily   carvedilol  25 mg Oral BID WC   digoxin  0.25 mg Oral Daily   divalproex  500 mg Oral BID   melatonin  3 mg Oral QHS   mirtazapine  15 mg Oral QHS   paliperidone  6 mg Oral Daily   polyethylene glycol  17 g Oral BID   pregabalin  50 mg Oral BID   QUEtiapine  50 mg Oral QHS   sacubitril-valsartan  1 tablet Oral BID   senna-docusate  2 tablet Oral BID   sodium chloride  2 g Oral TID WC   Continuous Infusions:   LOS: 154 days    Time spent: 15 min  Lacretia Nicks, DO [facility] Available via Epic secure chat 7am-7pm After these hours, please refer to coverage provider listed on [email or web address] 03/02/2022, 3:27 PM

## 2022-03-03 DIAGNOSIS — E871 Hypo-osmolality and hyponatremia: Secondary | ICD-10-CM | POA: Diagnosis not present

## 2022-03-03 NOTE — Progress Notes (Signed)
PROGRESS NOTE    Jim Dunn  ACZ:[id number] DOB: [birth year] DOA: 09/24/2021 PCP: Oneita Hurt, No    Brief Narrative:   Jim Dunn is Jim Dunn 56 y.o. male with past medical history significant for paranoid schizophrenia, psychogenic polydipsia, HTN, chronic systolic CHF, history of seizures, COPD, alcohol/drug abuse, tobacco abuse who presented to [facility] ED on 1/21 via EMS for altered behavior per family.  Family thought his behavior was off due to possible low sodium has been drinking Journei Thomassen lot of water.  Patient was also endorsing visual hallucinations.  Family also does not feel they can take care of him at home.  His sister reports that her biggest concern is that he burns Sharisa Toves lot of things and urinates on things and does not recall events.  He requires constant supervision and assistance with much of his ADLs.  In the ED, temperature 9 9.2 F, BP 153/88, HR 88, RR 15, SPO2 95% on room air.  Sodium 129 (baseline 121-126), EtOH level less than 10, valproic acid level 19, serum osmolality 262.  Psychiatry, social work consulted.  Patient started IV fluids.  TRH consulted for admission.  Patient's hospital course was complicated by acute symptomatic hyponatremia due to excessive water intake and breakthrough seizures.  Currently patient is medically stable for discharge.  DSS guardian has determined the patient is unsafe to live independently and looking at options for placement at this time.  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 TID on 6/26 (was 2g BID) -- Continue intermittent monitoring of electrolytes  Major neurocognitive disorder PTSD Paranoid schizophrenia with hallucinations Dementia without behavioral disturbance Patient lacks decision-making capacity. --Seroquel 25 mg p.o. nightly --Depakote ER 500mg  PO BID --Invega 6mg  PO daily --Xanax 1 mg p.o. twice  daily --Mirtazapine 15 mg p.o. nightly --Social work for placement  Seizure disorder --Depakote ER 500 g p.o. twice daily  Chronic systolic congestive heart failure, compensated TTE 09/26/2021 with LVEF 30-35%, LV moderately decreased function, LV with global hypokinesis, LV mildly dilated, LV diastolic parameters normal, trivial MR, no aortic stenosis, IVC normal in size. --Digoxin 0.25 mg p.o. daily --Entresto 24-26 mg p.o. twice daily --Carvedilol 25 mg p.o. twice daily  Essential hypertension --Amlodipine 10 mg p.o. daily --Carvedilol --Entresto 24-26 mg p.o. twice daily  Hyperlipidemia --Atorvastatin 40 mg p.o. daily  Peripheral neuropathy --Lyrica increased to 50 mg p.o. BID  Tobacco use disorder --Nicotine gum as needed  Obesity Body mass index is 41.25 kg/m. Outpatient follow-up with PCP.     DVT prophylaxis: Place and maintain sequential compression device Start: 11/01/21 1711    Code Status: Full Code Family Communication: No family present at bedside this morning.  Disposition Plan:  Level of care: Med-Surg Status is: Inpatient  Remains inpatient appropriate because: Pending placement which has been difficult per social work   Consultants:  Psychiatry  Procedures:  TTE  Antimicrobials:  None   Subjective: No complaints, asking regarding eventual discharge  Objective: Vitals:   03/03/22 0300 03/03/22 0806 03/03/22 1115 03/03/22 1519  BP: 123/71 133/82 129/83 110/80  Pulse: 65 71 72 81  Resp: 18 18 18 18   Temp: 98.3 F (36.8 C) 98.3 F (36.8 C) 98.3 F (36.8 C) 97.9 F (36.6 C)  TempSrc: Oral     SpO2: 99% 97% 97% 98%  Weight:      Height:        Intake/Output Summary (Last 24  hours) at 03/03/2022 1739 Last data filed at 03/03/2022 1509 Gross per 24 hour  Intake 1080 ml  Output --  Net 1080 ml   Filed Weights   01/10/22 0432 01/11/22 0500 02/27/22 1700  Weight: 115.6 kg 116.7 kg 123.1 kg    Examination:  Physical  Exam: General: No acute distress. Sitting in chair, reading bible.  Also seen up walking halls.  Lungs: unlabored Neurological: Moves all extremities 4. Cranial nerves II through XII grossly intact.   Data Reviewed: I have personally reviewed following labs and imaging studies  CBC: Recent Labs  Lab 02/27/22 0029 03/02/22 0800  WBC 7.4 6.5  NEUTROABS  --  2.1  HGB 11.4* 12.4*  HCT 34.6* 36.5*  MCV 70.0* 68.9*  PLT 148* 160   Basic Metabolic Panel: Recent Labs  Lab 02/27/22 0029 02/28/22 1355 03/01/22 1117 03/02/22 0800  NA 130* 136 137 136  K 4.0 3.9 4.3 4.2  CL 99 101 102 105  CO2 24 24 24 24   GLUCOSE 104* 125* 78 90  BUN 10 6 9 12   CREATININE 0.64 0.70 0.62 0.72  CALCIUM 8.4* 9.3 9.3 8.7*  MG  --   --   --  2.0  PHOS  --   --   --  4.5    GFR: Estimated Creatinine Clearance: 134.8 mL/min (by C-G formula based on SCr of 0.72 mg/dL). Liver Function Tests: Recent Labs  Lab 03/02/22 0800  AST 17  ALT 11  ALKPHOS 52  BILITOT 0.4  PROT 6.3*  ALBUMIN 3.6   No results for input(s): "LIPASE", "AMYLASE" in the last 168 hours. No results for input(s): "AMMONIA" in the last 168 hours. Coagulation Profile: No results for input(s): "INR", "PROTIME" in the last 168 hours. Cardiac Enzymes: No results for input(s): "CKTOTAL", "CKMB", "CKMBINDEX", "TROPONINI" in the last 168 hours. BNP (last 3 results) No results for input(s): "PROBNP" in the last 8760 hours. HbA1C: No results for input(s): "HGBA1C" in the last 72 hours. CBG: No results for input(s): "GLUCAP" in the last 168 hours. Lipid Profile: No results for input(s): "CHOL", "HDL", "LDLCALC", "TRIG", "CHOLHDL", "LDLDIRECT" in the last 72 hours. Thyroid Function Tests: No results for input(s): "TSH", "T4TOTAL", "FREET4", "T3FREE", "THYROIDAB" in the last 72 hours. Anemia Panel: No results for input(s): "VITAMINB12", "FOLATE", "FERRITIN", "TIBC", "IRON", "RETICCTPCT" in the last 72 hours. Sepsis Labs: No  results for input(s): "PROCALCITON", "LATICACIDVEN" in the last 168 hours.  No results found for this or any previous visit (from the past 240 hour(s)).       Radiology Studies: No results found.      Scheduled Meds:  ALPRAZolam  1 mg Oral BID   amLODipine  10 mg Oral Daily   atorvastatin  40 mg Oral Daily   carvedilol  25 mg Oral BID WC   digoxin  0.25 mg Oral Daily   divalproex  500 mg Oral BID   melatonin  3 mg Oral QHS   mirtazapine  15 mg Oral QHS   paliperidone  6 mg Oral Daily   polyethylene glycol  17 g Oral BID   pregabalin  50 mg Oral BID   QUEtiapine  50 mg Oral QHS   sacubitril-valsartan  1 tablet Oral BID   senna-docusate  2 tablet Oral BID   sodium chloride  2 g Oral TID WC   Continuous Infusions:   LOS: 155 days    Time spent: 15 min    , DO [facility] Available via  Epic secure chat 7am-7pm After these hours, please refer to coverage provider listed on [email or web address] 03/03/2022, 5:39 PM

## 2022-03-04 DIAGNOSIS — E871 Hypo-osmolality and hyponatremia: Secondary | ICD-10-CM | POA: Diagnosis not present

## 2022-03-04 NOTE — Progress Notes (Signed)
PROGRESS NOTE    [name]  ZCH:[id number] DOB: [birth year] DOA: 09/24/2021 PCP: Oneita Hurt, No    Brief Narrative:   Jim Dunn is Jim Dunn 56 y.o. male with past medical history significant for paranoid schizophrenia, psychogenic polydipsia, HTN, chronic systolic CHF, history of seizures, COPD, alcohol/drug abuse, tobacco abuse who presented to [facility] ED on 1/21 via EMS for altered behavior per family.  Family thought his behavior was off due to possible low sodium has been drinking Naava Janeway lot of water.  Patient was also endorsing visual hallucinations.  Family also does not feel they can take care of him at home.  His sister reports that her biggest concern is that he burns Chetara Kropp lot of things and urinates on things and does not recall events.  He requires constant supervision and assistance with much of his ADLs.  In the ED, temperature 9 9.2 F, BP 153/88, HR 88, RR 15, SPO2 95% on room air.  Sodium 129 (baseline 121-126), EtOH level less than 10, valproic acid level 19, serum osmolality 262.  Psychiatry, social work consulted.  Patient started IV fluids.  TRH consulted for admission.  Patient's hospital course was complicated by acute symptomatic hyponatremia due to excessive water intake and breakthrough seizures.  Currently patient is medically stable for discharge.  DSS guardian has determined the patient is unsafe to live independently and looking at options for placement at this time.  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 TID on 6/26 (was 2g BID) -- Continue intermittent monitoring of electrolytes  Major neurocognitive disorder PTSD Paranoid schizophrenia with hallucinations Dementia without behavioral disturbance Patient lacks decision-making capacity. --Seroquel 25 mg p.o. nightly --Depakote ER 500mg  PO BID --Invega 6mg  PO daily --Xanax 1 mg p.o. twice  daily --Mirtazapine 15 mg p.o. nightly --Social work for placement  Seizure disorder --Depakote ER 500 g p.o. twice daily  Chronic systolic congestive heart failure, compensated TTE 09/26/2021 with LVEF 30-35%, LV moderately decreased function, LV with global hypokinesis, LV mildly dilated, LV diastolic parameters normal, trivial MR, no aortic stenosis, IVC normal in size. --Digoxin 0.25 mg p.o. daily --Entresto 24-26 mg p.o. twice daily --Carvedilol 25 mg p.o. twice daily  Essential hypertension --Amlodipine 10 mg p.o. daily --Carvedilol --Entresto 24-26 mg p.o. twice daily  Hyperlipidemia --Atorvastatin 40 mg p.o. daily  Peripheral neuropathy --Lyrica increased to 50 mg p.o. BID  Tobacco use disorder --Nicotine gum as needed  Obesity Body mass index is 41.25 kg/m. Outpatient follow-up with PCP.     DVT prophylaxis: Place and maintain sequential compression device Start: 11/01/21 1711    Code Status: Full Code Family Communication: No family present at bedside this morning.  Disposition Plan:  Level of care: Med-Surg Status is: Inpatient  Remains inpatient appropriate because: Pending placement which has been difficult per social work   Consultants:  Psychiatry  Procedures:  TTE  Antimicrobials:  None   Subjective: No complaints  Objective: Vitals:   03/03/22 1115 03/03/22 1519 03/03/22 1946 03/04/22 0808  BP: 129/83 110/80 (!) 129/94 111/66  Pulse: 72 81 73 69  Resp: 18 18 20 18   Temp: 98.3 F (36.8 C) 97.9 F (36.6 C) 98.1 F (36.7 C) 98 F (36.7 C)  TempSrc:   Oral   SpO2: 97% 98% 96%   Weight:      Height:        Intake/Output Summary (Last 24 hours) at 03/04/2022  1316 Last data filed at 03/03/2022 1509 Gross per 24 hour  Intake 120 ml  Output --  Net 120 ml   Filed Weights   01/10/22 0432 01/11/22 0500 02/27/22 1700  Weight: 115.6 kg 116.7 kg 123.1 kg    Examination:  Physical Exam: General: No acute distress.  Lying in bed on  phone with coffee and banana pudding beside him. Lungs: unlabored Neurological: Alert  Moves all extremities 4 with equal strength. Cranial nerves II through XII grossly intact. Skin: Warm and dry. No rashes or lesions. Extremities: No clubbing or cyanosis. No edema.  Data Reviewed: I have personally reviewed following labs and imaging studies  CBC: Recent Labs  Lab 02/27/22 0029 03/02/22 0800  WBC 7.4 6.5  NEUTROABS  --  2.1  HGB 11.4* 12.4*  HCT 34.6* 36.5*  MCV 70.0* 68.9*  PLT 148* 160   Basic Metabolic Panel: Recent Labs  Lab 02/27/22 0029 02/28/22 1355 03/01/22 1117 03/02/22 0800  NA 130* 136 137 136  K 4.0 3.9 4.3 4.2  CL 99 101 102 105  CO2 24 24 24 24   GLUCOSE 104* 125* 78 90  BUN 10 6 9 12   CREATININE 0.64 0.70 0.62 0.72  CALCIUM 8.4* 9.3 9.3 8.7*  MG  --   --   --  2.0  PHOS  --   --   --  4.5    GFR: Estimated Creatinine Clearance: 134.8 mL/min (by C-G formula based on SCr of 0.72 mg/dL). Liver Function Tests: Recent Labs  Lab 03/02/22 0800  AST 17  ALT 11  ALKPHOS 52  BILITOT 0.4  PROT 6.3*  ALBUMIN 3.6   No results for input(s): "LIPASE", "AMYLASE" in the last 168 hours. No results for input(s): "AMMONIA" in the last 168 hours. Coagulation Profile: No results for input(s): "INR", "PROTIME" in the last 168 hours. Cardiac Enzymes: No results for input(s): "CKTOTAL", "CKMB", "CKMBINDEX", "TROPONINI" in the last 168 hours. BNP (last 3 results) No results for input(s): "PROBNP" in the last 8760 hours. HbA1C: No results for input(s): "HGBA1C" in the last 72 hours. CBG: No results for input(s): "GLUCAP" in the last 168 hours. Lipid Profile: No results for input(s): "CHOL", "HDL", "LDLCALC", "TRIG", "CHOLHDL", "LDLDIRECT" in the last 72 hours. Thyroid Function Tests: No results for input(s): "TSH", "T4TOTAL", "FREET4", "T3FREE", "THYROIDAB" in the last 72 hours. Anemia Panel: No results for input(s): "VITAMINB12", "FOLATE", "FERRITIN",  "TIBC", "IRON", "RETICCTPCT" in the last 72 hours. Sepsis Labs: No results for input(s): "PROCALCITON", "LATICACIDVEN" in the last 168 hours.  No results found for this or any previous visit (from the past 240 hour(s)).       Radiology Studies: No results found.      Scheduled Meds:  ALPRAZolam  1 mg Oral BID   amLODipine  10 mg Oral Daily   atorvastatin  40 mg Oral Daily   carvedilol  25 mg Oral BID WC   digoxin  0.25 mg Oral Daily   divalproex  500 mg Oral BID   melatonin  3 mg Oral QHS   mirtazapine  15 mg Oral QHS   paliperidone  6 mg Oral Daily   polyethylene glycol  17 g Oral BID   pregabalin  50 mg Oral BID   QUEtiapine  50 mg Oral QHS   sacubitril-valsartan  1 tablet Oral BID   senna-docusate  2 tablet Oral BID   sodium chloride  2 g Oral TID WC   Continuous Infusions:   LOS: 156 days  Time spent: 15 min    Lacretia Nicks, DO [facility] Available via Epic secure chat 7am-7pm After these hours, please refer to coverage provider listed on [email or web address] 03/04/2022, 1:16 PM

## 2022-03-05 DIAGNOSIS — E871 Hypo-osmolality and hyponatremia: Secondary | ICD-10-CM | POA: Diagnosis not present

## 2022-03-05 NOTE — Progress Notes (Signed)
PROGRESS NOTE    Jim Dunn  MWU:[id number] DOB: 1966-01-11 DOA: 09/24/2021 PCP: Jim Dunn, No    Brief Narrative:   Jim Dunn is Jim Dunn 56 y.o. male with past medical history significant for paranoid schizophrenia, psychogenic polydipsia, HTN, chronic systolic CHF, history of seizures, COPD, alcohol/drug abuse, tobacco abuse who presented to [facility] ED on 1/21 via EMS for altered behavior per family.  Family thought his behavior was off due to possible low sodium has been drinking Jim Dunn lot of water.  Patient was also endorsing visual hallucinations.  Family also does not feel they can take care of him at home.  His sister reports that her biggest concern is that he burns Jim Dunn lot of things and urinates on things and does not recall events.  He requires constant supervision and assistance with much of his ADLs.  In the ED, temperature 9 9.2 F, BP 153/88, HR 88, RR 15, SPO2 95% on room air.  Sodium 129 (baseline 121-126), EtOH level less than 10, valproic acid level 19, serum osmolality 262.  Psychiatry, social work consulted.  Patient started IV fluids.  TRH consulted for admission.  Patient's hospital course was complicated by acute symptomatic hyponatremia due to excessive water intake and breakthrough seizures.  Currently patient is medically stable for discharge.  DSS guardian has determined the patient is unsafe to live independently and looking at options for placement at this time.  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 TID on 6/26 (was 2g BID) -- Continue intermittent monitoring of electrolytes  Major neurocognitive disorder PTSD Paranoid schizophrenia with hallucinations Dementia without behavioral disturbance Patient lacks decision-making capacity. --Seroquel 25 mg p.o. nightly --Depakote ER 500mg  PO BID --Invega 6mg  PO daily --Xanax 1 mg p.o. twice  daily --Mirtazapine 15 mg p.o. nightly --Social work for placement  Seizure disorder --Depakote ER 500 g p.o. twice daily  Chronic systolic congestive heart failure, compensated TTE 09/26/2021 with LVEF 30-35%, LV moderately decreased function, LV with global hypokinesis, LV mildly dilated, LV diastolic parameters normal, trivial MR, no aortic stenosis, IVC normal in size. --Digoxin 0.25 mg p.o. daily --Entresto 24-26 mg p.o. twice daily --Carvedilol 25 mg p.o. twice daily  Essential hypertension --Amlodipine 10 mg p.o. daily --Carvedilol --Entresto 24-26 mg p.o. twice daily  Hyperlipidemia --Atorvastatin 40 mg p.o. daily  Peripheral neuropathy --Lyrica increased to 50 mg p.o. BID  Tobacco use disorder --Nicotine gum as needed  Obesity Body mass index is 41.25 kg/m. Outpatient follow-up with PCP.     DVT prophylaxis: Place and maintain sequential compression device Start: 11/01/21 1711    Code Status: Full Code Family Communication: No family present at bedside this morning.  Disposition Plan:  Level of care: Med-Surg Status is: Inpatient  Remains inpatient appropriate because: Pending placement which has been difficult per social work   Consultants:  Psychiatry  Procedures:  TTE  Antimicrobials:  None   Subjective: No complaints  Objective: Vitals:   03/03/22 1946 03/04/22 0808 03/04/22 2057 03/05/22 0719  BP: (!) 129/94 111/66 123/85 121/78  Pulse: 73 69 76 71  Resp: 20 18 20 18   Temp: 98.1 F (36.7 C) 98 F (36.7 C) 99 F (37.2 C) 98 F (36.7 C)  TempSrc: Oral  Oral   SpO2: 96%  97% 98%  Weight:      Height:        Intake/Output Summary (Last 24 hours) at 03/05/2022  1511 Last data filed at 03/05/2022 1000 Gross per 24 hour  Intake 480 ml  Output --  Net 480 ml   Filed Weights   01/10/22 0432 01/11/22 0500 02/27/22 1700  Weight: 115.6 kg 116.7 kg 123.1 kg    Examination:  General: No acute distress. Seen walking hall Lungs:  unlabored Neurological: moving all extremities, walking Extremities: No clubbing or cyanosis. No edema.   Data Reviewed: I have personally reviewed following labs and imaging studies  CBC: Recent Labs  Lab 02/27/22 0029 03/02/22 0800  WBC 7.4 6.5  NEUTROABS  --  2.1  HGB 11.4* 12.4*  HCT 34.6* 36.5*  MCV 70.0* 68.9*  PLT 148* 160   Basic Metabolic Panel: Recent Labs  Lab 02/27/22 0029 02/28/22 1355 03/01/22 1117 03/02/22 0800  NA 130* 136 137 136  K 4.0 3.9 4.3 4.2  CL 99 101 102 105  CO2 24 24 24 24   GLUCOSE 104* 125* 78 90  BUN 10 6 9 12   CREATININE 0.64 0.70 0.62 0.72  CALCIUM 8.4* 9.3 9.3 8.7*  MG  --   --   --  2.0  PHOS  --   --   --  4.5    GFR: Estimated Creatinine Clearance: 134.8 mL/min (by C-G formula based on SCr of 0.72 mg/dL). Liver Function Tests: Recent Labs  Lab 03/02/22 0800  AST 17  ALT 11  ALKPHOS 52  BILITOT 0.4  PROT 6.3*  ALBUMIN 3.6   No results for input(s): "LIPASE", "AMYLASE" in the last 168 hours. No results for input(s): "AMMONIA" in the last 168 hours. Coagulation Profile: No results for input(s): "INR", "PROTIME" in the last 168 hours. Cardiac Enzymes: No results for input(s): "CKTOTAL", "CKMB", "CKMBINDEX", "TROPONINI" in the last 168 hours. BNP (last 3 results) No results for input(s): "PROBNP" in the last 8760 hours. HbA1C: No results for input(s): "HGBA1C" in the last 72 hours. CBG: No results for input(s): "GLUCAP" in the last 168 hours. Lipid Profile: No results for input(s): "CHOL", "HDL", "LDLCALC", "TRIG", "CHOLHDL", "LDLDIRECT" in the last 72 hours. Thyroid Function Tests: No results for input(s): "TSH", "T4TOTAL", "FREET4", "T3FREE", "THYROIDAB" in the last 72 hours. Anemia Panel: No results for input(s): "VITAMINB12", "FOLATE", "FERRITIN", "TIBC", "IRON", "RETICCTPCT" in the last 72 hours. Sepsis Labs: No results for input(s): "PROCALCITON", "LATICACIDVEN" in the last 168 hours.  No results found for  this or any previous visit (from the past 240 hour(s)).       Radiology Studies: No results found.      Scheduled Meds:  ALPRAZolam  1 mg Oral BID   amLODipine  10 mg Oral Daily   atorvastatin  40 mg Oral Daily   carvedilol  25 mg Oral BID WC   digoxin  0.25 mg Oral Daily   divalproex  500 mg Oral BID   melatonin  3 mg Oral QHS   mirtazapine  15 mg Oral QHS   paliperidone  6 mg Oral Daily   polyethylene glycol  17 g Oral BID   pregabalin  50 mg Oral BID   QUEtiapine  50 mg Oral QHS   sacubitril-valsartan  1 tablet Oral BID   senna-docusate  2 tablet Oral BID   sodium chloride  2 g Oral TID WC   Continuous Infusions:   LOS: 157 days    Time spent: 15 min    , DO [facility] Available via Epic secure chat 7am-7pm After these hours, please refer to coverage provider listed on  [email or web address] 03/05/2022, 3:11 PM

## 2022-03-06 DIAGNOSIS — E871 Hypo-osmolality and hyponatremia: Secondary | ICD-10-CM | POA: Diagnosis not present

## 2022-03-06 NOTE — Progress Notes (Signed)
PROGRESS NOTE    Jim Dunn  MBE:[id number] DOB: 26-Dec-1965 DOA: 09/24/2021 PCP: Oneita Hurt, No    Brief Narrative:   Jim Dunn is Jim Dunn 56 y.o. male with past medical history significant for paranoid schizophrenia, psychogenic polydipsia, HTN, chronic systolic CHF, history of seizures, COPD, alcohol/drug abuse, tobacco abuse who presented to [facility] ED on 1/21 via EMS for altered behavior per family.  Family thought his behavior was off due to possible low sodium has been drinking Jim Dunn lot of water.  Patient was also endorsing visual hallucinations.  Family also does not feel they can take care of him at home.  His sister reports that her biggest concern is that he burns Para Cossey lot of things and urinates on things and does not recall events.  He requires constant supervision and assistance with much of his ADLs.  In the ED, temperature 9 9.2 F, BP 153/88, HR 88, RR 15, SPO2 95% on room air.  Sodium 129 (baseline 121-126), EtOH level less than 10, valproic acid level 19, serum osmolality 262.  Psychiatry, social work consulted.  Patient started IV fluids.  TRH consulted for admission.  Patient's hospital course was complicated by acute symptomatic hyponatremia due to excessive water intake and breakthrough seizures.  Currently patient is medically stable for discharge.  DSS guardian has determined the patient is unsafe to live independently and looking at options for placement at this time.  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 TID on 6/26 (was 2g BID) -- Continue intermittent monitoring of electrolytes  Major neurocognitive disorder PTSD Paranoid schizophrenia with hallucinations Dementia without behavioral disturbance Patient lacks decision-making capacity. --Seroquel 25 mg p.o. nightly --Depakote ER 500mg  PO BID --Invega 6mg  PO daily --Xanax 1 mg p.o. twice  daily --Mirtazapine 15 mg p.o. nightly --Social work for placement  Seizure disorder --Depakote ER 500 g p.o. twice daily  Chronic systolic congestive heart failure, compensated TTE 09/26/2021 with LVEF 30-35%, LV moderately decreased function, LV with global hypokinesis, LV mildly dilated, LV diastolic parameters normal, trivial MR, no aortic stenosis, IVC normal in size. --Digoxin 0.25 mg p.o. daily --Entresto 24-26 mg p.o. twice daily --Carvedilol 25 mg p.o. twice daily  Essential hypertension --Amlodipine 10 mg p.o. daily --Carvedilol --Entresto 24-26 mg p.o. twice daily  Hyperlipidemia --Atorvastatin 40 mg p.o. daily  Peripheral neuropathy --Lyrica increased to 50 mg p.o. BID  Tobacco use disorder --Nicotine gum as needed  Obesity Body mass index is 41.25 kg/m. Outpatient follow-up with PCP.     DVT prophylaxis: Place and maintain sequential compression device Start: 11/01/21 1711    Code Status: Full Code Family Communication: No family present at bedside this morning.  Disposition Plan:  Level of care: Med-Surg Status is: Inpatient  Remains inpatient appropriate because: Pending placement which has been difficult per social work.  Per SW today, following up with group home in [facility], will follow    Consultants:  Psychiatry  Procedures:  TTE  Antimicrobials:  None   Subjective: No complaints, wondering when something will happen  Objective: Vitals:   03/04/22 2057 03/05/22 0719 03/05/22 2022 03/06/22 0818  BP: 123/85 121/78 (!) 152/94 (!) 147/79  Pulse: 76 71 76 69  Resp: 20 18 18 18   Temp: 99 F (37.2 C) 98 F (36.7 C) 98.4 F (36.9 C) 98.1 F (36.7 C)  TempSrc: Oral  Oral Oral  SpO2: 97% 98% 98% 100%  Weight:      Height:        Intake/Output Summary (Last 24 hours) at 03/06/2022 1452 Last data filed at 03/06/2022 1214 Gross per 24 hour  Intake 1080 ml  Output --  Net 1080 ml   Filed Weights   01/10/22 0432 01/11/22 0500  02/27/22 1700  Weight: 115.6 kg 116.7 kg 123.1 kg    Examination:  General: No acute distress. Up and walking in his room Lungs: unlabored Neurological: Alert . Moves all extremities 4 . Cranial nerves II through XII grossly intact. Extremities: No clubbing or cyanosis. No edema.  Data Reviewed: I have personally reviewed following labs and imaging studies  CBC: Recent Labs  Lab 03/02/22 0800  WBC 6.5  NEUTROABS 2.1  HGB 12.4*  HCT 36.5*  MCV 68.9*  PLT 160   Basic Metabolic Panel: Recent Labs  Lab 02/28/22 1355 03/01/22 1117 03/02/22 0800  NA 136 137 136  K 3.9 4.3 4.2  CL 101 102 105  CO2 24 24 24   GLUCOSE 125* 78 90  BUN 6 9 12   CREATININE 0.70 0.62 0.72  CALCIUM 9.3 9.3 8.7*  MG  --   --  2.0  PHOS  --   --  4.5    GFR: Estimated Creatinine Clearance: 134.8 mL/min (by C-G formula based on SCr of 0.72 mg/dL). Liver Function Tests: Recent Labs  Lab 03/02/22 0800  AST 17  ALT 11  ALKPHOS 52  BILITOT 0.4  PROT 6.3*  ALBUMIN 3.6   No results for input(s): "LIPASE", "AMYLASE" in the last 168 hours. No results for input(s): "AMMONIA" in the last 168 hours. Coagulation Profile: No results for input(s): "INR", "PROTIME" in the last 168 hours. Cardiac Enzymes: No results for input(s): "CKTOTAL", "CKMB", "CKMBINDEX", "TROPONINI" in the last 168 hours. BNP (last 3 results) No results for input(s): "PROBNP" in the last 8760 hours. HbA1C: No results for input(s): "HGBA1C" in the last 72 hours. CBG: No results for input(s): "GLUCAP" in the last 168 hours. Lipid Profile: No results for input(s): "CHOL", "HDL", "LDLCALC", "TRIG", "CHOLHDL", "LDLDIRECT" in the last 72 hours. Thyroid Function Tests: No results for input(s): "TSH", "T4TOTAL", "FREET4", "T3FREE", "THYROIDAB" in the last 72 hours. Anemia Panel: No results for input(s): "VITAMINB12", "FOLATE", "FERRITIN", "TIBC", "IRON", "RETICCTPCT" in the last 72 hours. Sepsis Labs: No results for input(s):  "PROCALCITON", "LATICACIDVEN" in the last 168 hours.  No results found for this or any previous visit (from the past 240 hour(s)).       Radiology Studies: No results found.      Scheduled Meds:  ALPRAZolam  1 mg Oral BID   amLODipine  10 mg Oral Daily   atorvastatin  40 mg Oral Daily   carvedilol  25 mg Oral BID WC   digoxin  0.25 mg Oral Daily   divalproex  500 mg Oral BID   melatonin  3 mg Oral QHS   mirtazapine  15 mg Oral QHS   paliperidone  6 mg Oral Daily   polyethylene glycol  17 g Oral BID   pregabalin  50 mg Oral BID   QUEtiapine  50 mg Oral QHS   sacubitril-valsartan  1 tablet Oral BID   senna-docusate  2 tablet Oral BID   sodium chloride  2 g Oral TID WC   Continuous Infusions:   LOS: 158 days    Time spent: 15 min    , DO [facility] Available via Epic secure chat 7am-7pm After these hours,  please refer to coverage provider listed on [email or web address] 03/06/2022, 2:52 PM

## 2022-03-06 NOTE — Progress Notes (Addendum)
1pm: CSW spoke with Maralyn Sago at [employer] - new clinicals were requested and sent for review.  9:50am: CSW spoke with patient's legal guardian Cottie Banda who states she spoke with Terri Skains, the owner of a mental health group home in [place] to discuss the patient. [name] also states she prefers the patient be placed in at [facility] for programing and supervision.  CSW spoke with Maralyn Sago at [employer] who states the administrator is back in the office today and she will review patient to determine if a bed offer can be made.  [name], MSW, LCSW Transitions of Care  Clinical Social Worker II [phone]

## 2022-03-07 DIAGNOSIS — E871 Hypo-osmolality and hyponatremia: Secondary | ICD-10-CM | POA: Diagnosis not present

## 2022-03-07 NOTE — Progress Notes (Signed)
PROGRESS NOTE    Jim Dunn  XBW:[id number] DOB: [birth year] DOA: 09/24/2021 PCP: Jim Dunn, No    Brief Narrative:   Jim Dunn is Jim Dunn 56 y.o. male with past medical history significant for paranoid schizophrenia, psychogenic polydipsia, HTN, chronic systolic CHF, history of seizures, COPD, alcohol/drug abuse, tobacco abuse who presented to [facility] ED on 1/21 via EMS for altered behavior per family.  Family thought his behavior was off due to possible low sodium has been drinking Jim Dunn lot of water.  Patient was also endorsing visual hallucinations.  Family also does not feel they can take care of him at home.  His sister reports that her biggest concern is that he burns [name] lot of things and urinates on things and does not recall events.  He requires constant supervision and assistance with much of his ADLs.  In the ED, temperature 9 9.2 F, BP 153/88, HR 88, RR 15, SPO2 95% on room air.  Sodium 129 (baseline 121-126), EtOH level less than 10, valproic acid level 19, serum osmolality 262.  Psychiatry, social work consulted.  Patient started IV fluids.  TRH consulted for admission.  Patient's hospital course was complicated by acute symptomatic hyponatremia due to excessive water intake and breakthrough seizures.  Currently patient is medically stable for discharge.  DSS guardian has determined the patient is unsafe to live independently and looking at options for placement at this time.  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 TID on 6/26 (was 2g BID) -- Continue intermittent monitoring of electrolytes  Major neurocognitive disorder PTSD Paranoid schizophrenia with hallucinations Dementia without behavioral disturbance Patient lacks decision-making capacity. --Seroquel 25 mg p.o. nightly --Depakote ER 500mg  PO BID --Invega 6mg  PO daily --Xanax 1 mg p.o. twice  daily --Mirtazapine 15 mg p.o. nightly --Social work for placement  Seizure disorder --Depakote ER 500 g p.o. twice daily  Chronic systolic congestive heart failure, compensated TTE 09/26/2021 with LVEF 30-35%, LV moderately decreased function, LV with global hypokinesis, LV mildly dilated, LV diastolic parameters normal, trivial MR, no aortic stenosis, IVC normal in size. --Digoxin 0.25 mg p.o. daily --Entresto 24-26 mg p.o. twice daily --Carvedilol 25 mg p.o. twice daily  Essential hypertension --Amlodipine 10 mg p.o. daily --Carvedilol --Entresto 24-26 mg p.o. twice daily  Hyperlipidemia --Atorvastatin 40 mg p.o. daily  Peripheral neuropathy --Lyrica increased to 50 mg p.o. BID  Tobacco use disorder --Nicotine gum as needed  Obesity Body mass index is 41.25 kg/m. Outpatient follow-up with PCP.     DVT prophylaxis: Place and maintain sequential compression device Start: 11/01/21 1711    Code Status: Full Code Family Communication: No family present at bedside this morning.  Disposition Plan:  Level of care: Med-Surg Status is: Inpatient  Remains inpatient appropriate because: Pending placement which has been difficult per social work.  Per SW today, following up with group home in [facility], will follow (see 7/3 note from carol watson LCSW)   Consultants:  Psychiatry  Procedures:  TTE  Antimicrobials:  None   Subjective: No complaints, asking when somehting will happen  Objective: Vitals:   03/06/22 0818 03/06/22 1639 03/06/22 2020 03/07/22 0745  BP: (!) 147/79 (!) 139/93 (!) 151/81 127/69  Pulse: 69 71 70 63  Resp: 18 20 18 20   Temp: 98.1 F (36.7 C)  98.1 F (36.7 C) 97.9 F (36.6 C)  TempSrc: Oral  Oral Oral  SpO2: 100%  98% 97% 97%  Weight:      Height:        Intake/Output Summary (Last 24 hours) at 03/07/2022 1458 Last data filed at 03/07/2022 1311 Gross per 24 hour  Intake 1080 ml  Output --  Net 1080 ml   Filed Weights    01/10/22 0432 01/11/22 0500 02/27/22 1700  Weight: 115.6 kg 116.7 kg 123.1 kg    Examination:  General: No acute distress. Seen walking halls. Lungs: unlabored Neurological: alert, moving all extremities, walking with normal gate Extremities: No clubbing or cyanosis. No edema.   Data Reviewed: I have personally reviewed following labs and imaging studies  CBC: Recent Labs  Lab 03/02/22 0800  WBC 6.5  NEUTROABS 2.1  HGB 12.4*  HCT 36.5*  MCV 68.9*  PLT 160   Basic Metabolic Panel: Recent Labs  Lab 03/01/22 1117 03/02/22 0800  NA 137 136  K 4.3 4.2  CL 102 105  CO2 24 24  GLUCOSE 78 90  BUN 9 12  CREATININE 0.62 0.72  CALCIUM 9.3 8.7*  MG  --  2.0  PHOS  --  4.5    GFR: Estimated Creatinine Clearance: 134.8 mL/min (by C-G formula based on SCr of 0.72 mg/dL). Liver Function Tests: Recent Labs  Lab 03/02/22 0800  AST 17  ALT 11  ALKPHOS 52  BILITOT 0.4  PROT 6.3*  ALBUMIN 3.6   No results for input(s): "LIPASE", "AMYLASE" in the last 168 hours. No results for input(s): "AMMONIA" in the last 168 hours. Coagulation Profile: No results for input(s): "INR", "PROTIME" in the last 168 hours. Cardiac Enzymes: No results for input(s): "CKTOTAL", "CKMB", "CKMBINDEX", "TROPONINI" in the last 168 hours. BNP (last 3 results) No results for input(s): "PROBNP" in the last 8760 hours. HbA1C: No results for input(s): "HGBA1C" in the last 72 hours. CBG: No results for input(s): "GLUCAP" in the last 168 hours. Lipid Profile: No results for input(s): "CHOL", "HDL", "LDLCALC", "TRIG", "CHOLHDL", "LDLDIRECT" in the last 72 hours. Thyroid Function Tests: No results for input(s): "TSH", "T4TOTAL", "FREET4", "T3FREE", "THYROIDAB" in the last 72 hours. Anemia Panel: No results for input(s): "VITAMINB12", "FOLATE", "FERRITIN", "TIBC", "IRON", "RETICCTPCT" in the last 72 hours. Sepsis Labs: No results for input(s): "PROCALCITON", "LATICACIDVEN" in the last 168 hours.  No  results found for this or any previous visit (from the past 240 hour(s)).       Radiology Studies: No results found.      Scheduled Meds:  ALPRAZolam  1 mg Oral BID   amLODipine  10 mg Oral Daily   atorvastatin  40 mg Oral Daily   carvedilol  25 mg Oral BID WC   digoxin  0.25 mg Oral Daily   divalproex  500 mg Oral BID   melatonin  3 mg Oral QHS   mirtazapine  15 mg Oral QHS   paliperidone  6 mg Oral Daily   polyethylene glycol  17 g Oral BID   pregabalin  50 mg Oral BID   QUEtiapine  50 mg Oral QHS   sacubitril-valsartan  1 tablet Oral BID   senna-docusate  2 tablet Oral BID   sodium chloride  2 g Oral TID WC   Continuous Infusions:   LOS: 159 days    Time spent: 15 min    Lacretia Nicks, DO [facility] Available via Epic secure chat 7am-7pm After these hours, please refer to coverage provider listed on [email or web address] 03/07/2022, 2:58 PM

## 2022-03-08 DIAGNOSIS — I5022 Chronic systolic (congestive) heart failure: Secondary | ICD-10-CM | POA: Diagnosis not present

## 2022-03-08 DIAGNOSIS — J431 Panlobular emphysema: Secondary | ICD-10-CM | POA: Diagnosis not present

## 2022-03-08 DIAGNOSIS — E871 Hypo-osmolality and hyponatremia: Secondary | ICD-10-CM | POA: Diagnosis not present

## 2022-03-08 DIAGNOSIS — E669 Obesity, unspecified: Secondary | ICD-10-CM

## 2022-03-08 DIAGNOSIS — F0390 Unspecified dementia without behavioral disturbance: Secondary | ICD-10-CM | POA: Diagnosis not present

## 2022-03-08 NOTE — Progress Notes (Addendum)
CSW spoke with Maralyn Sago at [facility] who states the patient cannot be accept into the facility due to lack of overnight staffing.  CSW attempted to reach [place] at [facility] without success - no voicemail option available.  CSW attempted to reach [facility] without success - a voicemail was left requesting a return call.  [name], MSW, LCSW Transitions of Care  Clinical Social Worker II [phone]

## 2022-03-08 NOTE — Progress Notes (Signed)
Progress Note  Patient: Jim Dunn YQM:[id number] DOB: 09/12/1965  DOA: 09/24/2021  DOS: 03/08/2022    Brief hospital course: Jim Dunn is a 56 y.o. male with past medical history significant for paranoid schizophrenia, psychogenic polydipsia, HTN, chronic systolic CHF, history of seizures, COPD, alcohol/drug abuse, tobacco abuse who presented to [facility] ED on 1/21 via EMS for altered behavior per family.  Family thought his behavior was off due to possible low sodium has been drinking a lot of water.  Patient was also endorsing visual hallucinations.  Family also does not feel they can take care of him at home.  His sister reports that her biggest concern is that he burns a lot of things and urinates on things and does not recall events.  He requires constant supervision and assistance with much of his ADLs.   In the ED, temperature 9 9.2 F, BP 153/88, HR 88, RR 15, SPO2 95% on room air.  Sodium 129 (baseline 121-126), EtOH level less than 10, valproic acid level 19, serum osmolality 262.  Psychiatry, social work consulted.  Patient started IV fluids.  TRH consulted for admission.   Patient's hospital course was complicated by acute symptomatic hyponatremia due to excessive water intake and breakthrough seizures.  Currently patient is medically stable for discharge.  DSS guardian has determined the patient is unsafe to live independently and looking at options for placement at this time.  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 TID on 6/26 (was 2g BID) -- Continue intermittent monitoring of electrolytes   Major neurocognitive disorder PTSD Paranoid schizophrenia with hallucinations Dementia without behavioral disturbance Patient lacks decision-making capacity. --Seroquel 25 mg p.o. nightly --Depakote ER 500mg  PO BID --Invega 6mg  PO daily --Xanax 1 mg p.o.  twice daily --Mirtazapine 15 mg p.o. nightly --Social work for placement   Seizure disorder --Depakote ER 500 g p.o. twice daily   Chronic systolic congestive heart failure, compensated TTE 09/26/2021 with LVEF 30-35%, LV moderately decreased function, LV with global hypokinesis, LV mildly dilated, LV diastolic parameters normal, trivial MR, no aortic stenosis, IVC normal in size. --Digoxin 0.25 mg p.o. daily --Entresto 24-26 mg p.o. twice daily --Carvedilol 25 mg p.o. twice daily   Essential hypertension --Amlodipine 10 mg p.o. daily --Carvedilol --Entresto 24-26 mg p.o. twice daily   Hyperlipidemia --Atorvastatin 40 mg p.o. daily   Peripheral neuropathy --Lyrica increased to 50 mg p.o. BID   Tobacco use disorder --Nicotine gum as needed   Obesity Body mass index is 41.25 kg/m. Outpatient follow-up with PCP.    Subjective: Interviewed at nurses station, no complaints.   Objective: Vitals:   03/07/22 2007 03/08/22 0847 03/08/22 1127 03/08/22 1608  BP: (!) 112/91 (!) 156/80 134/80 (!) 143/89  Pulse: 72 73 87 93  Resp: 16 20 18 20   Temp: 98.2 F (36.8 C) 98.4 F (36.9 C) 97.9 F (36.6 C)   TempSrc: Oral Oral Oral   SpO2: 98% 99% 98% 96%  Weight:      Height:       Gen:  56 y.o. male in no distress Pulm: Nonlabored breathing room air.  CV: Regular rate and rhythm. No murmur, rub, or gallop. No JVD, no dependent edema. GI: Abdomen soft, non-tender, non-distended, with normoactive bowel sounds.  Ext: Warm, no deformities Skin: No rashes, lesions or ulcers on visualized skin. Neuro: Alert and oriented. No focal neurological deficits. Psych: Calm.  Data Personally reviewed:  CBC: Recent Labs  Lab 03/02/22 0800  WBC 6.5  NEUTROABS 2.1  HGB 12.4*  HCT 36.5*  MCV 68.9*  PLT 160   Basic Metabolic Panel: Recent Labs  Lab 03/02/22 0800  NA 136  K 4.2  CL 105  CO2 24  GLUCOSE 90  BUN 12  CREATININE 0.72  CALCIUM 8.7*  MG 2.0  PHOS 4.5    GFR: Estimated Creatinine Clearance: 134.8 mL/min (by C-G formula based on SCr of 0.72 mg/dL). Liver Function Tests: Recent Labs  Lab 03/02/22 0800  AST 17  ALT 11  ALKPHOS 52  BILITOT 0.4  PROT 6.3*  ALBUMIN 3.6   Family Communication: None at bedside  Disposition: Status is: Inpatient Remains inpatient appropriate because: Unsafe disposition. CSW working diligently on this difficult-to-place patient. Planned Discharge Destination:  Group home   Tyrone Nine, MD 03/08/2022 5:02 PM Page by [email or web address]

## 2022-03-09 DIAGNOSIS — F0390 Unspecified dementia without behavioral disturbance: Secondary | ICD-10-CM | POA: Diagnosis not present

## 2022-03-09 DIAGNOSIS — J431 Panlobular emphysema: Secondary | ICD-10-CM | POA: Diagnosis not present

## 2022-03-09 DIAGNOSIS — I5022 Chronic systolic (congestive) heart failure: Secondary | ICD-10-CM | POA: Diagnosis not present

## 2022-03-09 DIAGNOSIS — E871 Hypo-osmolality and hyponatremia: Secondary | ICD-10-CM | POA: Diagnosis not present

## 2022-03-09 MED ORDER — SACUBITRIL-VALSARTAN 49-51 MG PO TABS
1.0000 | ORAL_TABLET | Freq: Two times a day (BID) | ORAL | Status: DC
  Administered 2022-03-09 – 2022-04-07 (×58): 1 via ORAL
  Filled 2022-03-09 (×58): qty 1

## 2022-03-09 NOTE — Progress Notes (Signed)
  Progress Note  Patient: Jim Dunn ZES:[id number] DOB: [birth year]  DOA: 09/24/2021  DOS: 03/09/2022    Brief hospital course: Miklos Bidinger is a 56 y.o. male with past medical history significant for paranoid schizophrenia, psychogenic polydipsia, HTN, chronic systolic CHF, history of seizures, COPD, alcohol/drug abuse, tobacco abuse who presented to [facility] ED on 1/21 via EMS for altered behavior per family.  Family thought his behavior was off due to possible low sodium has been drinking a lot of water.  Patient was also endorsing visual hallucinations.  Family also does not feel they can take care of him at home.  His sister reports that her biggest concern is that he burns a lot of things and urinates on things and does not recall events.  He requires constant supervision and assistance with much of his ADLs.   In the ED, temperature 9 9.2 F, BP 153/88, HR 88, RR 15, SPO2 95% on room air.  Sodium 129 (baseline 121-126), EtOH level less than 10, valproic acid level 19, serum osmolality 262.  Psychiatry, social work consulted.  Patient started IV fluids.  TRH consulted for admission.   Patient's hospital course was complicated by acute symptomatic hyponatremia due to excessive water intake and breakthrough seizures.  Currently patient is medically stable for discharge.  DSS guardian has determined the patient is unsafe to live independently and looking at options for placement at this time.  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 TID on 6/26   --Recheck BMP in AM (~weekly)   Major neurocognitive disorder PTSD Paranoid schizophrenia with hallucinations Dementia without behavioral disturbance Patient lacks decision-making capacity. --Seroquel 25 mg p.o. nightly --Depakote ER 500mg  PO BID --Invega 6mg  PO daily --Xanax 1 mg p.o. twice daily --Mirtazapine 15 mg p.o.  nightly --Social work for placement   Seizure disorder --Depakote ER 500 g p.o. twice daily   Chronic systolic congestive heart failure, compensated TTE 09/26/2021 with LVEF 30-35%, LV moderately decreased function, LV with global hypokinesis, LV mildly dilated, LV diastolic parameters normal, trivial MR, no aortic stenosis, IVC normal in size. --Digoxin 0.25 mg p.o. daily --Entresto, BPs elevated, will increase to 49-51mg  po BID --Carvedilol 25 mg p.o. twice daily   Essential hypertension --Amlodipine 10 mg p.o. daily --Carvedilol --Entresto 24-26 mg p.o. twice daily   Hyperlipidemia --Atorvastatin 40 mg p.o. daily   Peripheral neuropathy --Lyrica increased to 50 mg p.o. BID   Tobacco use disorder --Nicotine gum as needed   Obesity Body mass index is 41.25 kg/m. Outpatient follow-up with PCP.    Subjective: Seen walking halls, has no complaints, does not feel dizzy, no swelling. Eating and drinking fine.  Objective: Vitals:   03/08/22 1608 03/08/22 2008 03/09/22 0854 03/09/22 1113  BP: (!) 143/89 (!) 158/89 98/79 (!) 152/90  Pulse: 93 75 68 81  Resp: 20 18 18 20   Temp:  98 F (36.7 C) 97.8 F (36.6 C)   TempSrc:   Oral   SpO2: 96% 97% 99% 99%  Weight:      Height:       No distress Steady gait, alert, interactive, cooperative No pitting edema  Family Communication: None at bedside  Disposition: Status is: Inpatient Remains inpatient appropriate because: Unsafe disposition. CSW working diligently on this difficult-to-place patient. Planned Discharge Destination:  Group home   05/10/22, MD 03/09/2022 1:29 PM Page by [email or web address]

## 2022-03-09 NOTE — Progress Notes (Signed)
CSW spoke with patient at bedside to discuss his discharge plan. CSW provided patient updates regarding the continued search for placement.  CSW requested patient's legal guardian Cottie Banda check on the status of the change on the designated payee.  [name], MSW, LCSW Transitions of Care  Clinical Social Worker II [phone]

## 2022-03-10 DIAGNOSIS — J431 Panlobular emphysema: Secondary | ICD-10-CM | POA: Diagnosis not present

## 2022-03-10 DIAGNOSIS — F0390 Unspecified dementia without behavioral disturbance: Secondary | ICD-10-CM | POA: Diagnosis not present

## 2022-03-10 DIAGNOSIS — I5022 Chronic systolic (congestive) heart failure: Secondary | ICD-10-CM | POA: Diagnosis not present

## 2022-03-10 DIAGNOSIS — E871 Hypo-osmolality and hyponatremia: Secondary | ICD-10-CM | POA: Diagnosis not present

## 2022-03-10 LAB — BASIC METABOLIC PANEL
Anion gap: 11 (ref 5–15)
BUN: 9 mg/dL (ref 6–20)
CO2: 24 mmol/L (ref 22–32)
Calcium: 8.8 mg/dL — ABNORMAL LOW (ref 8.9–10.3)
Chloride: 103 mmol/L (ref 98–111)
Creatinine, Ser: 0.67 mg/dL (ref 0.61–1.24)
GFR, Estimated: >60 mL/min (ref >60)
Glucose, Bld: 99 mg/dL (ref 70–99)
Potassium: 3.7 mmol/L (ref 3.5–5.1)
Sodium: 138 mmol/L (ref 135–145)

## 2022-03-10 MED ORDER — PREGABALIN 25 MG PO CAPS
75.0000 mg | ORAL_CAPSULE | Freq: Two times a day (BID) | ORAL | Status: DC
Start: 2022-03-10 — End: 2022-04-07
  Administered 2022-03-10 – 2022-04-07 (×56): 75 mg via ORAL
  Filled 2022-03-10 (×56): qty 3

## 2022-03-10 NOTE — Progress Notes (Addendum)
Progress Note  Patient: Jim Dunn [id number] DOB: 01-18-1966  DOA: 09/24/2021  DOS: 03/10/2022    Brief hospital course: Bartholomew Ramesh is a 56 y.o. male with past medical history significant for paranoid schizophrenia, psychogenic polydipsia, HTN, chronic systolic CHF, history of seizures, COPD, alcohol/drug abuse, tobacco abuse who presented to [facility] ED on 1/21 via EMS for altered behavior per family.  Family thought his behavior was off due to possible low sodium has been drinking a lot of water.  Patient was also endorsing visual hallucinations.  Family also does not feel they can take care of him at home.  His sister reports that her biggest concern is that he burns a lot of things and urinates on things and does not recall events.  He requires constant supervision and assistance with much of his ADLs.   In the ED, temperature 9 9.2 F, BP 153/88, HR 88, RR 15, SPO2 95% on room air.  Sodium 129 (baseline 121-126), EtOH level less than 10, valproic acid level 19, serum osmolality 262.  Psychiatry, social work consulted.  Patient started IV fluids.  TRH consulted for admission.   Patient's hospital course was complicated by acute symptomatic hyponatremia due to excessive water intake and breakthrough seizures.  Currently patient is medically stable for discharge.  DSS guardian has determined the patient is unsafe to live independently and looking at options for placement at this time.  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 TID on 6/26 and sodium level is normalized durably on recheck of labs 7/7.   Major neurocognitive disorder PTSD Paranoid schizophrenia with hallucinations Dementia without behavioral disturbance Patient lacks decision-making capacity. --Seroquel 25 mg p.o. nightly --Depakote ER 500mg  PO BID --Invega 6mg  PO daily --Xanax 1 mg p.o. twice  daily --Mirtazapine 15 mg p.o. nightly --Social work for placement   Seizure disorder --Depakote ER 500 g p.o. twice daily   Chronic systolic congestive heart failure, compensated TTE 09/26/2021 with LVEF 30-35%, LV moderately decreased function, LV with global hypokinesis, LV mildly dilated, LV diastolic parameters normal, trivial MR, no aortic stenosis, IVC normal in size. --Digoxin 0.25 mg p.o. daily --Entresto increased to 49-51mg  po BID 7/6 --Carvedilol 25 mg p.o. twice daily   Essential hypertension --Amlodipine 10 mg p.o. daily --Carvedilol --Entresto 49-51 mg p.o. twice daily   Hyperlipidemia --Atorvastatin 40 mg p.o. daily   Peripheral neuropathy --Increase lyrica to 75mg  po BID. Ceiling dose would be 100mg  TID.   Tobacco use disorder --Nicotine gum as needed   Obesity Body mass index is 41.25 kg/m. Outpatient follow-up with PCP.    Subjective: Reports constant numbness in feet which is chronic, no other complaints.   Objective: Vitals:   03/09/22 1523 03/09/22 1920 03/10/22 0820 03/10/22 1141  BP: (!) 153/95 128/62 (!) 135/97 137/80  Pulse: 80 76 68 76  Resp: 18 16 14 18   Temp: 98.1 F (36.7 C) 98.7 F (37.1 C) 97.7 F (36.5 C) (!) 97.5 F (36.4 C)  TempSrc: Oral Oral Oral Oral  SpO2: 97% 99% 97% 100%  Weight:      Height:       No distress Clear, nonlabored No pitting edema   Family Communication: None at bedside  Disposition: Status is: Inpatient Remains inpatient appropriate because: Unsafe disposition. CSW working diligently on this difficult-to-place patient. Planned Discharge Destination:  Group home   05/10/22, MD 03/10/2022 1:51 PM Page by [email or web address]

## 2022-03-11 DIAGNOSIS — E871 Hypo-osmolality and hyponatremia: Secondary | ICD-10-CM | POA: Diagnosis not present

## 2022-03-11 DIAGNOSIS — F0390 Unspecified dementia without behavioral disturbance: Secondary | ICD-10-CM | POA: Diagnosis not present

## 2022-03-11 DIAGNOSIS — J431 Panlobular emphysema: Secondary | ICD-10-CM | POA: Diagnosis not present

## 2022-03-11 DIAGNOSIS — I5022 Chronic systolic (congestive) heart failure: Secondary | ICD-10-CM | POA: Diagnosis not present

## 2022-03-11 NOTE — Progress Notes (Signed)
Progress Note  Patient: Jim Dunn [id number] DOB: 1966-05-12  DOA: 09/24/2021  DOS: 03/11/2022    Brief hospital course: Jim Dunn is a 56 y.o. male with past medical history significant for paranoid schizophrenia, psychogenic polydipsia, HTN, chronic systolic CHF, history of seizures, COPD, alcohol/drug abuse, tobacco abuse who presented to [facility] ED on 1/21 via EMS for altered behavior per family.  Family thought his behavior was off due to possible low sodium has been drinking a lot of water.  Patient was also endorsing visual hallucinations.  Family also does not feel they can take care of him at home.  His sister reports that her biggest concern is that he burns a lot of things and urinates on things and does not recall events.  He requires constant supervision and assistance with much of his ADLs.   In the ED, temperature 9 9.2 F, BP 153/88, HR 88, RR 15, SPO2 95% on room air.  Sodium 129 (baseline 121-126), EtOH level less than 10, valproic acid level 19, serum osmolality 262.  Psychiatry, social work consulted.  Patient started IV fluids.  TRH consulted for admission.   Patient's hospital course was complicated by acute symptomatic hyponatremia due to excessive water intake and breakthrough seizures.  Currently patient is medically stable for discharge.  DSS guardian has determined the patient is unsafe to live independently and looking at options for placement at this time.  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 TID on 6/26 and sodium level is normalized durably on recheck of labs 7/7.   Major neurocognitive disorder PTSD Paranoid schizophrenia with hallucinations Dementia without behavioral disturbance Patient lacks decision-making capacity. --Seroquel 25 mg p.o. nightly --Depakote ER 500mg  PO BID --Invega 6mg  PO daily --Xanax 1 mg p.o. twice  daily --Mirtazapine 15 mg p.o. nightly --Social work for placement   Seizure disorder --Depakote ER 500 g p.o. twice daily   Chronic systolic congestive heart failure, compensated TTE 09/26/2021 with LVEF 30-35%, LV moderately decreased function, LV with global hypokinesis, LV mildly dilated, LV diastolic parameters normal, trivial MR, no aortic stenosis, IVC normal in size. --Digoxin 0.25 mg p.o. daily --Entresto increased to 49-51mg  po BID 7/6 --Carvedilol 25 mg p.o. twice daily   Essential hypertension --Amlodipine 10 mg p.o. daily --Carvedilol --Entresto 49-51 mg p.o. twice daily   Hyperlipidemia --Atorvastatin 40 mg p.o. daily   Peripheral neuropathy --Increase lyrica to 75mg  po BID. Ceiling dose would be 100mg  TID.   Tobacco use disorder --Nicotine gum as needed   Obesity Body mass index is 41.25 kg/m. Outpatient follow-up with PCP.    Subjective: Numbness in feet not changed yet, no new complaints. No lightheadedness.  Objective: Vitals:   03/10/22 1516 03/10/22 1959 03/11/22 0818 03/11/22 1121  BP: 125/79 (!) 141/111 105/87 130/74  Pulse: 86 80 68 76  Resp: 20 18 18 20   Temp: 99.2 F (37.3 C) 98.2 F (36.8 C) 97.8 F (36.6 C) 98.8 F (37.1 C)  TempSrc: Oral Oral Oral Oral  SpO2: 94% 98% 100% 99%  Weight:      Height:       No distress, pleasant, interactive, no focal deficits. Clear, nonlabored, RRR.    Family Communication: None at bedside  Disposition: Status is: Inpatient Remains inpatient appropriate because: Unsafe disposition. CSW working diligently on this difficult-to-place patient. Planned Discharge Destination:  Group home   05/11/22, MD 03/11/2022 12:47 PM Page by [email or web address]

## 2022-03-12 DIAGNOSIS — J431 Panlobular emphysema: Secondary | ICD-10-CM | POA: Diagnosis not present

## 2022-03-12 DIAGNOSIS — E871 Hypo-osmolality and hyponatremia: Secondary | ICD-10-CM | POA: Diagnosis not present

## 2022-03-12 DIAGNOSIS — F0390 Unspecified dementia without behavioral disturbance: Secondary | ICD-10-CM | POA: Diagnosis not present

## 2022-03-12 DIAGNOSIS — I5022 Chronic systolic (congestive) heart failure: Secondary | ICD-10-CM | POA: Diagnosis not present

## 2022-03-12 NOTE — Progress Notes (Signed)
Progress Note  Patient: Jim Dunn [id number] DOB: [birth year]  DOA: 09/24/2021  DOS: 03/12/2022    Brief hospital course: Jim Dunn is a 56 y.o. male with past medical history significant for paranoid schizophrenia, psychogenic polydipsia, HTN, chronic systolic CHF, history of seizures, COPD, alcohol/drug abuse, tobacco abuse who presented to [facility] ED on 1/21 via EMS for altered behavior per family.  Family thought his behavior was off due to possible low sodium has been drinking a lot of water.  Patient was also endorsing visual hallucinations.  Family also does not feel they can take care of him at home.  His sister reports that her biggest concern is that he burns a lot of things and urinates on things and does not recall events.  He requires constant supervision and assistance with much of his ADLs.   In the ED, temperature 9 9.2 F, BP 153/88, HR 88, RR 15, SPO2 95% on room air.  Sodium 129 (baseline 121-126), EtOH level less than 10, valproic acid level 19, serum osmolality 262.  Psychiatry, social work consulted.  Patient started IV fluids.  TRH consulted for admission.   Patient's hospital course was complicated by acute symptomatic hyponatremia due to excessive water intake and breakthrough seizures.  Currently patient is medically stable for discharge.  DSS guardian has determined the patient is unsafe to live independently and looking at options for placement at this time.  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 TID on 6/26 and sodium level is normalized durably on recheck of labs 7/7.   Major neurocognitive disorder PTSD Paranoid schizophrenia with hallucinations Dementia without behavioral disturbance Patient lacks decision-making capacity. --Seroquel 25 mg p.o. nightly --Depakote ER 500mg  PO BID --Invega 6mg  PO daily --Xanax 1 mg p.o. twice  daily --Mirtazapine 15 mg p.o. nightly --Social work for placement   Seizure disorder --Depakote ER 500 g p.o. twice daily   Chronic systolic congestive heart failure, compensated TTE 09/26/2021 with LVEF 30-35%, LV moderately decreased function, LV with global hypokinesis, LV mildly dilated, LV diastolic parameters normal, trivial MR, no aortic stenosis, IVC normal in size. --Digoxin 0.25 mg p.o. daily --Entresto increased to 49-51mg  po BID 7/6 --Carvedilol 25 mg p.o. twice daily   Essential hypertension --Amlodipine 10 mg p.o. daily --Carvedilol --Entresto 49-51 mg p.o. twice daily   Hyperlipidemia --Atorvastatin 40 mg p.o. daily   Peripheral neuropathy --Increase lyrica to 75mg  po BID late last week, could increase further this coming week if symptoms uncontrolled.. Ceiling dose would be 100mg  TID.   Tobacco use disorder --Nicotine gum as needed   Obesity Body mass index is 41.25 kg/m. Outpatient follow-up with PCP.    Subjective: No new complaints.  Objective: Vitals:   03/11/22 0818 03/11/22 1121 03/11/22 1935 03/12/22 0807  BP: 105/87 130/74 130/60 128/76  Pulse: 68 76 72 92  Resp: 18 20 18 18   Temp: 97.8 F (36.6 C) 98.8 F (37.1 C) 98 F (36.7 C) 97.8 F (36.6 C)  TempSrc: Oral Oral  Oral  SpO2: 100% 99% 97% 98%  Weight:      Height:       Resting quietly in bed this morning, RRR, obese, clear   Family Communication: None at bedside  Disposition: Status is: Inpatient Remains inpatient appropriate because: Unsafe disposition. CSW working diligently on this difficult-to-place patient. Planned Discharge Destination:  Group home   05/12/22, MD 03/12/2022 8:49 AM Page by  [email or web address]

## 2022-03-13 DIAGNOSIS — E871 Hypo-osmolality and hyponatremia: Secondary | ICD-10-CM | POA: Diagnosis not present

## 2022-03-13 NOTE — TOC Progression Note (Addendum)
Transition of Care [facility]) - Progression Note    Patient Details  Name: Jim Dunn MRN: [id number] Date of Birth: 09-18-1965  Transition of Care [facility]) CM/SW Contact  Janae Bridgeman, RN Phone Number: 03/13/2022, 2:25 PM  Clinical Narrative:    CM called and spoke with Rivka Spring of [facility] and the owner has available bed but is unable to offer a bed to the patient  - due to cost of the admission bed and patient does not qualify for Medicaid coverage for admission.  03/13/2022 1446  - CM called and spoke with Minta Balsam, owner of [facility] in [place] and she has male bed availability and will review patient's clinicals.  Clinicals sent to [email or web address] for review.  CM and MSW with DTP Team will continue to follow the patient for needed Assisted Living Placement for needed 24 hour supervision for safety.   Expected Discharge Plan: Assisted Living Barriers to Discharge:  (Pending placement by DSS Guardian)  Expected Discharge Plan and Services Expected Discharge Plan: Assisted Living In-house Referral: Clinical Social Work Discharge Planning Services: Medication Assistance, CM Consult Post Acute Care Choice:  (Needs FCH/ALF placement) Living arrangements for the past 2 months: Single Family Home                                       Social Determinants of Health (SDOH) Interventions    Readmission Risk Interventions    01/18/2022    2:30 PM 01/23/2020    4:46 PM  Readmission Risk Prevention Plan  Transportation Screening Complete Complete  HRI or Home Care Consult  Complete  Social Work Consult for Recovery Care Planning/Counseling  Complete  Palliative Care Screening  Not Applicable  Medication Review Oceanographer) Complete   PCP or Specialist appointment within 3-5 days of discharge Complete   HRI or Home Care Consult Complete   SW Recovery Care/Counseling Consult Complete   Palliative Care Screening Not Applicable    Skilled Nursing Facility Not Applicable

## 2022-03-13 NOTE — Progress Notes (Signed)
CSW left voicemail with Terri Skains of [facility] requesting a return call.  [name], MSW, LCSW Transitions of Care  Clinical Social Worker II [phone]

## 2022-03-13 NOTE — Progress Notes (Signed)
Progress Note  Patient: Jim Dunn [id number] DOB: 12-25-1965  DOA: 09/24/2021  DOS: 03/13/2022    Brief hospital course: Jim Dunn is Jim Dunn 56 y.o. male with past medical history significant for paranoid schizophrenia, psychogenic polydipsia, HTN, chronic systolic CHF, history of seizures, COPD, alcohol/drug abuse, tobacco abuse who presented to [facility] ED on 1/21 via EMS for altered behavior per family.  Family thought his behavior was off due to possible low sodium has been drinking Jim Dunn lot of water.  Patient was also endorsing visual hallucinations.  Family also does not feel they can take care of him at home.  His sister reports that her biggest concern is that he burns Jim Dunn lot of things and urinates on things and does not recall events.  He requires constant supervision and assistance with much of his ADLs.   In the ED, temperature 9 9.2 F, BP 153/88, HR 88, RR 15, SPO2 95% on room air.  Sodium 129 (baseline 121-126), EtOH level less than 10, valproic acid level 19, serum osmolality 262.  Psychiatry, social work consulted.  Patient started IV fluids.  TRH consulted for admission.   Patient's hospital course was complicated by acute symptomatic hyponatremia due to excessive water intake and breakthrough seizures.  Currently patient is medically stable for discharge.  DSS guardian has determined the patient is unsafe to live independently and looking at options for placement at this time.  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 TID on 6/26 and sodium level is normalized durably on recheck of labs 7/7.   Major neurocognitive disorder PTSD Paranoid schizophrenia with hallucinations Dementia without behavioral disturbance Patient lacks decision-making capacity. --Seroquel 25 mg p.o. nightly --Depakote ER 500mg  PO BID --Invega 6mg  PO daily --Xanax 1 mg p.o. twice  daily --Mirtazapine 15 mg p.o. nightly --Social work for placement   Seizure disorder --Depakote ER 500 g p.o. twice daily   Chronic systolic congestive heart failure, compensated TTE 09/26/2021 with LVEF 30-35%, LV moderately decreased function, LV with global hypokinesis, LV mildly dilated, LV diastolic parameters normal, trivial MR, no aortic stenosis, IVC normal in size. --Digoxin 0.25 mg p.o. daily --Entresto increased to 49-51mg  po BID 7/6 --Carvedilol 25 mg p.o. twice daily   Essential hypertension --Amlodipine 10 mg p.o. daily --Carvedilol --Entresto 49-51 mg p.o. twice daily   Hyperlipidemia --Atorvastatin 40 mg p.o. daily   Peripheral neuropathy --Increased lyrica to 75mg  po BID late last week, could increase further this coming week if symptoms uncontrolled.. Ceiling dose would be 100mg  TID.   Tobacco use disorder --Nicotine gum as needed   Obesity Body mass index is 41.25 kg/m. Outpatient follow-up with PCP.    Subjective: no complaints  Objective: Vitals:   03/13/22 0359 03/13/22 0900 03/13/22 1238 03/13/22 1633  BP: 108/72 129/79 129/79 140/70  Pulse: 78 74 80 79  Resp: 18 20 20 17   Temp: 97.8 F (36.6 C) 98.6 F (37 C) 97.8 F (36.6 C) 98.8 F (37.1 C)  TempSrc: Oral Oral Oral Oral  SpO2: 98% 97% 98% 98%  Weight:      Height:       Seen standing at front desk, walking around, in and out of room Unlabored, no acute distress Moving all extremities, no focal deficits noted   Family Communication: None at bedside  Disposition: Status is: Inpatient Remains inpatient appropriate because: Unsafe disposition. CSW working diligently on this difficult-to-place patient. Planned Discharge Destination:  Group home   Lacretia Nicks, MD 03/13/2022 6:23 PM Page by [email or web address]

## 2022-03-14 DIAGNOSIS — E871 Hypo-osmolality and hyponatremia: Secondary | ICD-10-CM | POA: Diagnosis not present

## 2022-03-14 NOTE — Progress Notes (Signed)
Progress Note  Patient: Jim Dunn [id number] DOB: 1966-04-28  DOA: 09/24/2021  DOS: 03/14/2022    Brief hospital course: Jim Dunn is Jim Dunn 56 y.o. male with past medical history significant for paranoid schizophrenia, psychogenic polydipsia, HTN, chronic systolic CHF, history of seizures, COPD, alcohol/drug abuse, tobacco abuse who presented to [facility] ED on 1/21 via EMS for altered behavior per family.  Family thought his behavior was off due to possible low sodium has been drinking Jim Dunn lot of water.  Patient was also endorsing visual hallucinations.  Family also does not feel they can take care of him at home.  His sister reports that her biggest concern is that he burns Jim Dunn lot of things and urinates on things and does not recall events.  He requires constant supervision and assistance with much of his ADLs.   In the ED, temperature 9 9.2 F, BP 153/88, HR 88, RR 15, SPO2 95% on room air.  Sodium 129 (baseline 121-126), EtOH level less than 10, valproic acid level 19, serum osmolality 262.  Psychiatry, social work consulted.  Patient started IV fluids.  TRH consulted for admission.   Patient's hospital course was complicated by acute symptomatic hyponatremia due to excessive water intake and breakthrough seizures.  Currently patient is medically stable for discharge.  DSS guardian has determined the patient is unsafe to live independently and looking at options for placement at this time.  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 TID on 6/26 and sodium level is normalized durably on recheck of labs 7/7.   Major neurocognitive disorder PTSD Paranoid schizophrenia with hallucinations Dementia without behavioral disturbance Patient lacks decision-making capacity. --Seroquel 25 mg p.o. nightly --Depakote ER 500mg  PO BID --Invega 6mg  PO daily --Xanax 1 mg p.o. twice  daily --Mirtazapine 15 mg p.o. nightly --Social work for placement   Seizure disorder --Depakote ER 500 g p.o. twice daily   Chronic systolic congestive heart failure, compensated TTE 09/26/2021 with LVEF 30-35%, LV moderately decreased function, LV with global hypokinesis, LV mildly dilated, LV diastolic parameters normal, trivial MR, no aortic stenosis, IVC normal in size. --Digoxin 0.25 mg p.o. daily --Entresto increased to 49-51mg  po BID 7/6 --Carvedilol 25 mg p.o. twice daily   Essential hypertension --Amlodipine 10 mg p.o. daily --Carvedilol --Entresto 49-51 mg p.o. twice daily   Hyperlipidemia --Atorvastatin 40 mg p.o. daily   Peripheral neuropathy --Increased lyrica to 75mg  po BID late last week, could increase further this coming week if symptoms uncontrolled.. Ceiling dose would be 100mg  TID.   Tobacco use disorder --Nicotine gum as needed   Obesity Body mass index is 41.25 kg/m. Outpatient follow-up with PCP.    Subjective: no complaints  Objective: Vitals:   03/14/22 0801 03/14/22 1213 03/14/22 1544 03/14/22 1949  BP: 132/90 (!) 133/94 (!) 141/80 (!) 158/90  Pulse: 80 79 81 79  Resp: 20 17 20  (!) 21  Temp: 98.2 F (36.8 C) 98.2 F (36.8 C) 98.8 F (37.1 C) 99.6 F (37.6 C)  TempSrc: Oral Oral Oral Oral  SpO2: 99% 97% 97% 99%  Weight:      Height:       Again seen walking the halls No distress, joking and talking to nurses Unlabored breathing   Family Communication: None at bedside  Disposition: Status is: Inpatient Remains inpatient appropriate because: Unsafe disposition. CSW working diligently on this difficult-to-place patient. Planned Discharge Destination:  Group home   Lockhart  Lowell Guitar, MD 03/14/2022 8:11 PM Page by [email or web address]

## 2022-03-14 NOTE — Progress Notes (Signed)
CSW completed intake packet for [facility] and sent it to [employer] at DSS for final review and signatures.  [name], MSW, LCSW Transitions of Care  Clinical Social Worker II [phone]

## 2022-03-14 NOTE — TOC Progression Note (Signed)
Transition of Care [facility]) - Progression Note    Patient Details  Name: Opal Mckellips MRN: [id number] Date of Birth: [birth year]  Transition of Care [facility]) CM/SW Contact  Janae Bridgeman, RN Phone Number: 03/14/2022, 9:04 AM  Clinical Narrative:    CM called and spoke with Bufford Lope, administrator for [facility] in [place], [place] [phone] and a phone interview was scheduled for 10 am this morning.  Imani, admissions administrator will conduct the interview at 10 am this morning.  The patient was made aware of the interview scheduled with him at 10 am.  I called and spoke with Cottie Banda, [facility] guardian [phone] and she plans to be present in the room to assist with the interview and possible bed offer at the facility.  CM and MSW with DTP Team will continue to follow the patient for needed ALF placement for care.   Expected Discharge Plan: Assisted Living Barriers to Discharge:  (Pending placement by DSS Guardian)  Expected Discharge Plan and Services Expected Discharge Plan: Assisted Living In-house Referral: Clinical Social Work Discharge Planning Services: Medication Assistance, CM Consult Post Acute Care Choice:  (Needs FCH/ALF placement) Living arrangements for the past 2 months: Single Family Home                                       Social Determinants of Health (SDOH) Interventions    Readmission Risk Interventions    01/18/2022    2:30 PM 01/23/2020    4:46 PM  Readmission Risk Prevention Plan  Transportation Screening Complete Complete  HRI or Home Care Consult  Complete  Social Work Consult for Recovery Care Planning/Counseling  Complete  Palliative Care Screening  Not Applicable  Medication Review Oceanographer) Complete   PCP or Specialist appointment within 3-5 days of discharge Complete   HRI or Home Care Consult Complete   SW Recovery Care/Counseling Consult Complete   Palliative Care Screening Not  Applicable   Skilled Nursing Facility Not Applicable

## 2022-03-14 NOTE — [place] FL2 (Signed)
[place] MEDICAID FL2 LEVEL OF CARE SCREENING TOOL     IDENTIFICATION  Patient Name: Jim Dunn Birthdate: [birth year] Sex: male Admission Date (Current Location): 09/24/2021  [facility] and [place] Number:  Social worker and Address:  [facility] [place], [address] [place], [place] [address]      Provider Number: [id number]  Attending Physician Name and Address:  Zigmund Daniel., *  Relative Name and Phone Number:  Constance Haw Coy DSS Guardian [phone], [phone]    Current Level of Care: Hospital Recommended Level of Care: Assisted Living Facility, Family Care Home Prior Approval Number:    Date Approved/Denied:   PASRR Number:    Discharge Plan: Other (Comment) (ALF, Group Home, or Family Care Home)    Current Diagnoses: Patient Active Problem List   Diagnosis Date Noted   Obesity (BMI 30-39.9) 02/07/2022   Hyponatremia    Peripheral neuropathy 10/20/2021   Dementia without behavioral disturbance (HCC) 09/25/2021   Needs assistance with community resources 09/12/2021   Disorganized schizophrenia (HCC)    Dementia (HCC) 06/08/2020   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   Chronic post-traumatic stress disorder (PTSD)    Noncompliance with medications    Subtherapeutic serum dilantin level    Seizure (HCC)    Hypomagnesemia 02/23/2020   Suicidal ideation 02/23/2020   Paranoid schizophrenia with hallucinations    Hypokalemia    Chronic systolic CHF (congestive heart failure) (HCC) 01/22/2020   Mixed hyperlipidemia 01/22/2020   COPD (chronic obstructive pulmonary disease) (HCC) 01/22/2020   Microcytic anemia 01/22/2020   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 01/22/2020   Seizure disorder (HCC) 01/22/2020    Orientation RESPIRATION BLADDER Height & Weight     Self, Time, Situation, Place  Normal Continent Weight: 271 lb 4.8 oz (123.1  kg) Height:  5\' 8"  (172.7 cm)  BEHAVIORAL SYMPTOMS/MOOD NEUROLOGICAL BOWEL NUTRITION STATUS     (past history of Seizures) Continent Diet (See Discharge Summary)  AMBULATORY STATUS COMMUNICATION OF NEEDS Skin   Independent Verbally Normal                       Personal Care Assistance Level of Assistance  Bathing, Feeding, Dressing Bathing Assistance: Independent Feeding assistance: Independent Dressing Assistance: Independent     Functional Limitations Info  Sight, Hearing, Speech Sight Info: Adequate Hearing Info: Adequate Speech Info: Adequate    SPECIAL CARE FACTORS FREQUENCY                       Contractures Contractures Info: Not present    Additional Factors Info  Code Status, Allergies, Psychotropic Code Status Info: Full code Allergies Info: NKDA Psychotropic Info: Xanex, Depakote, Ativan, Remeron, Invega, Lyrica, Seroquel         Current Medications (03/14/2022):  This is the current hospital active medication list Current Facility-Administered Medications  Medication Dose Route Frequency Provider Last Rate Last Admin   acetaminophen (TYLENOL) tablet 650 mg  650 mg Oral Q6H PRN 05/15/2022, MD   650 mg at 03/10/22 2059   ALPRAZolam (XANAX) tablet 1 mg  1 mg Oral BID 2060, MD   1 mg at 03/14/22 0930   amLODipine (NORVASC) tablet 10 mg  10 mg Oral Daily 05/15/22, MD   10 mg at 03/14/22 0931   atorvastatin (LIPITOR) tablet 40 mg  40 mg Oral Daily 05/15/22, MD   40  mg at 03/14/22 0931   bisacodyl (DULCOLAX) suppository 10 mg  10 mg Rectal Daily PRN Maretta Bees, MD   10 mg at 03/06/22 1933   carvedilol (COREG) tablet 25 mg  25 mg Oral BID WC Alberteen Sam, MD   25 mg at 03/14/22 0931   digoxin (LANOXIN) tablet 0.25 mg  0.25 mg Oral Daily Max Fickle B, MD   0.25 mg at 03/14/22 0931   divalproex (DEPAKOTE ER) 24 hr tablet 500 mg  500 mg Oral BID Max Fickle B, MD   500 mg at 03/14/22 0930    LORazepam (ATIVAN) injection 2 mg  2 mg Intravenous Q5 Min x 2 PRN Max Fickle B, MD       melatonin tablet 3 mg  3 mg Oral QHS Max Fickle B, MD   3 mg at 03/13/22 2208   mirtazapine (REMERON) tablet 15 mg  15 mg Oral QHS Max Fickle B, MD   15 mg at 03/13/22 2208   nicotine polacrilex (NICORETTE) gum 2 mg  2 mg Oral PRN Lonia Blood, MD   2 mg at 01/20/22 1000   Oral care mouth rinse  15 mL Mouth Rinse PRN [place], Alvira Philips, DO       paliperidone (INVEGA) 24 hr tablet 6 mg  6 mg Oral Daily Russella Dar, NP   6 mg at 03/14/22 0931   polyethylene glycol (MIRALAX / GLYCOLAX) packet 17 g  17 g Oral BID [place], Eric J, DO   17 g at 03/13/22 2209   pregabalin (LYRICA) capsule 75 mg  75 mg Oral BID Tyrone Nine, MD   75 mg at 03/14/22 0931   QUEtiapine (SEROQUEL) tablet 50 mg  50 mg Oral QHS Max Fickle B, MD   50 mg at 03/13/22 2208   sacubitril-valsartan (ENTRESTO) 49-51 mg per tablet  1 tablet Oral BID Tyrone Nine, MD   1 tablet at 03/14/22 0930   senna-docusate (Senokot-S) tablet 2 tablet  2 tablet Oral BID Lupita Leash, MD   2 tablet at 03/14/22 0931   sodium chloride tablet 2 g  2 g Oral TID WC [place], Eric J, DO   2 g at 03/14/22 1740     Discharge Medications: Please see discharge summary for a list of discharge medications.  Relevant Imaging Results:  Relevant Lab Results:   Additional Information SSN: [id number], current smoker  Inis Sizer, LCSW

## 2022-03-15 DIAGNOSIS — E871 Hypo-osmolality and hyponatremia: Secondary | ICD-10-CM | POA: Diagnosis not present

## 2022-03-15 NOTE — TOC Progression Note (Signed)
Transition of Care [facility]) - Progression Note    Patient Details  Name: Jim Dunn MRN: [id number] Date of Birth: 21-May-1966  Transition of Care [facility]) CM/SW [place], RN Phone Number: 03/15/2022, 1:51 PM  Clinical Narrative:    CM met with Barbarann Ehlers, DSS Guardian and all required medical documents required by [facility] were signed by her to allow the Medical physicians to treat the patient at the facility for follow up after he is admitted.  [name] Woolard, DSS Guardian states that she will meet with the patient's mother tomorrow and receive a copy of the patient's [facility] Medicare card to send to the facility with the patient.  Also, Woolard spoke with the patient's sister on the phone and patient's money provided through his disability check will be sent to [place], [place] with DSS by Friday, 03/17/22 to be used for admission and care at the facility.  I called and spoke with Gwenyth Ober, CM at [facility] ( secure email - [email or web address] and she was sent documents to treat patient at clinics including [place] [facility], [employer], and [facility].  I faxed completed documents to each of the Healthcare offices as well including any consults, progress notes, MAR, FL2, radiology imaging and lab-work.  Hard copies will be sent with the patient to the facility as well so that the ALF will have copies in the patient's chart at the facility.  Imani, CM at [place] states that she will likely be able to admit the patient to the facility for care on Monday, 03/20/22 or Tuesday, 03/21/22 once she received payment confirmation from [place] with DSS.  Woolard is aware and is expecting invoice to be sent.  Patient will need to be sent to the ALF facility by [facility] next week since the [facility] will be unable to transport the patient.  [place], [place] [phone] with [facility] will be on call next week  to cover needs while Carley Hammed is out of town.  Attending physician, Dr. Florene Glen and bedside nursing is aware that patient will likely discharge to the ALF facility on Monday and CM and MSW with DTP Team will continue to follow the patient for discharge planning needs.   Expected Discharge Plan: Assisted Living Barriers to Discharge:  (Pending placement by DSS Guardian)  Expected Discharge Plan and Services Expected Discharge Plan: Assisted Living In-house Referral: Clinical Social Work Discharge Planning Services: Medication Assistance, CM Consult Post Acute Care Choice:  (Needs FCH/ALF placement) Living arrangements for the past 2 months: Single Family Home                                       Social Determinants of Health (SDOH) Interventions    Readmission Risk Interventions    01/18/2022    2:30 PM 01/23/2020    4:46 PM  Readmission Risk Prevention Plan  Transportation Screening Complete Complete  HRI or Home Care Consult  Complete  Social Work Consult for [place] Planning/Counseling  Complete  Palliative Care Screening  Not Applicable  Medication Review Press photographer) Complete   PCP or Specialist appointment within 3-5 days of discharge Complete   HRI or [place] Complete   SW Recovery Care/Counseling Consult Complete   [place] Not Applicable

## 2022-03-15 NOTE — Progress Notes (Signed)
Progress Note  Patient: Jim Dunn DPO:[id number] DOB: 1966-07-02  DOA: 09/24/2021  DOS: 03/15/2022    Brief hospital course: Jim Dunn is Jim Dunn 56 y.o. male with past medical history significant for paranoid schizophrenia, psychogenic polydipsia, HTN, chronic systolic CHF, history of seizures, COPD, alcohol/drug abuse, tobacco abuse who presented to [facility] ED on 1/21 via EMS for altered behavior per family.  Family thought his behavior was off due to possible low sodium has been drinking Jim Dunn lot of water.  Patient was also endorsing visual hallucinations.  Family also does not feel they can take care of him at home.  His sister reports that her biggest concern is that he burns Jim Dunn lot of things and urinates on things and does not recall events.  He requires constant supervision and assistance with much of his ADLs.   In the ED, temperature 9 9.2 F, BP 153/88, HR 88, RR 15, SPO2 95% on room air.  Sodium 129 (baseline 121-126), EtOH level less than 10, valproic acid level 19, serum osmolality 262.  Psychiatry, social work consulted.  Patient started IV fluids.  TRH consulted for admission.   Patient's hospital course was complicated by acute symptomatic hyponatremia due to excessive water intake and breakthrough seizures.  Currently patient is medically stable for discharge.  DSS guardian has determined the patient is unsafe to live independently and looking at options for placement at this time.  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 TID on 6/26 and sodium level is normalized durably on recheck of labs 7/7.   Major neurocognitive disorder PTSD Paranoid schizophrenia with hallucinations Dementia without behavioral disturbance Patient lacks decision-making capacity. --Seroquel 25 mg p.o. nightly --Depakote ER 500mg  PO BID --Invega 6mg  PO daily --Xanax 1 mg p.o. twice  daily --Mirtazapine 15 mg p.o. nightly --Social work for placement   Seizure disorder --Depakote ER 500 g p.o. twice daily   Chronic systolic congestive heart failure, compensated TTE 09/26/2021 with LVEF 30-35%, LV moderately decreased function, LV with global hypokinesis, LV mildly dilated, LV diastolic parameters normal, trivial MR, no aortic stenosis, IVC normal in size. --Digoxin 0.25 mg p.o. daily --Entresto increased to 49-51mg  po BID 7/6 --Carvedilol 25 mg p.o. twice daily   Essential hypertension --Amlodipine 10 mg p.o. daily --Carvedilol --Entresto 49-51 mg p.o. twice daily   Hyperlipidemia --Atorvastatin 40 mg p.o. daily   Peripheral neuropathy --Increased lyrica to 75mg  po BID late last week, could increase further this coming week if symptoms uncontrolled.. Ceiling dose would be 100mg  TID.   Tobacco use disorder --Nicotine gum as needed   Obesity Body mass index is Body mass index is 41.25 kg/m.  Outpatient follow-up with PCP.    Subjective: no complaints  Objective: Vitals:   03/14/22 1949 03/15/22 0815 03/15/22 1143 03/15/22 1545  BP: (!) 158/90 129/79 128/78 115/61  Pulse: 79 79 82 81  Resp: (!) 21 20 20 19   Temp: 99.6 F (37.6 C) 98.9 F (37.2 C) 97.9 F (36.6 C) 98.4 F (36.9 C)  TempSrc: Oral Oral Oral Oral  SpO2: 99% 99% 97% 95%  Weight:      Height:       Seen in the halls No apparent distress Moving all extremities  Family Communication: None at bedside  Disposition: Status is: Inpatient Remains inpatient appropriate because: Unsafe disposition. CSW working diligently on this difficult-to-place patient. Planned Discharge Destination:  Group home   05/15/22, MD  03/15/2022 5:01 PM Page by [email or web address]

## 2022-03-16 DIAGNOSIS — E871 Hypo-osmolality and hyponatremia: Secondary | ICD-10-CM | POA: Diagnosis not present

## 2022-03-16 NOTE — Progress Notes (Signed)
Progress Note  Patient: Jim Dunn ZOX:[id number] DOB: [birth year]  DOA: 09/24/2021  DOS: 03/16/2022    Brief hospital course: Jim Dunn is Jim Dunn 56 y.o. male with past medical history significant for paranoid schizophrenia, psychogenic polydipsia, HTN, chronic systolic CHF, history of seizures, COPD, alcohol/drug abuse, tobacco abuse who presented to [facility] ED on 1/21 via EMS for altered behavior per family.  Family thought his behavior was off due to possible low sodium has been drinking Jim Dunn lot of water.  Patient was also endorsing visual hallucinations.  Family also does not feel they can take care of him at home.  His sister reports that her biggest concern is that he burns Jim Dunn lot of things and urinates on things and does not recall events.  He requires constant supervision and assistance with much of his ADLs.   In the ED, temperature 9 9.2 F, BP 153/88, HR 88, RR 15, SPO2 95% on room air.  Sodium 129 (baseline 121-126), EtOH level less than 10, valproic acid level 19, serum osmolality 262.  Psychiatry, social work consulted.  Patient started IV fluids.  TRH consulted for admission.   Patient's hospital course was complicated by acute symptomatic hyponatremia due to excessive water intake and breakthrough seizures.  Currently patient is medically stable for discharge.  DSS guardian has determined the patient is unsafe to live independently and looking at options for placement at this time.  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 TID on 6/26 and sodium level is normalized durably on recheck of labs 7/7.   Major neurocognitive disorder PTSD Paranoid schizophrenia with hallucinations Dementia without behavioral disturbance Patient lacks decision-making capacity. --Seroquel 25 mg p.o. nightly --Depakote ER 500mg  PO BID --Invega 6mg  PO daily --Xanax 1 mg p.o. twice  daily --Mirtazapine 15 mg p.o. nightly --Social work for placement   Seizure disorder --Depakote ER 500 g p.o. twice daily   Chronic systolic congestive heart failure, compensated TTE 09/26/2021 with LVEF 30-35%, LV moderately decreased function, LV with global hypokinesis, LV mildly dilated, LV diastolic parameters normal, trivial MR, no aortic stenosis, IVC normal in size. --Digoxin 0.25 mg p.o. daily --Entresto increased to 49-51mg  po BID 7/6 --Carvedilol 25 mg p.o. twice daily   Essential hypertension --Amlodipine 10 mg p.o. daily --Carvedilol --Entresto 49-51 mg p.o. twice daily   Hyperlipidemia --Atorvastatin 40 mg p.o. daily   Peripheral neuropathy --Increased lyrica to 75mg  po BID late last week, could increase further this coming week if symptoms uncontrolled.. Ceiling dose would be 100mg  TID.   Tobacco use disorder --Nicotine gum as needed   Obesity Body mass index is Body mass index is 41.25 kg/m.  Outpatient follow-up with PCP.    Subjective: no complaints  Objective: Vitals:   03/15/22 2132 03/16/22 0735 03/16/22 1130 03/16/22 1536  BP: (!) 149/89 (!) 138/95 (!) 142/93 (!) 117/97  Pulse: 82 80 82 85  Resp: 18 20 18 16   Temp: 98.7 F (37.1 C) 98 F (36.7 C) 98.1 F (36.7 C) 98.7 F (37.1 C)  TempSrc: Oral Oral Oral Oral  SpO2: 98% 99% 98% 96%  Weight:      Height:       Seen in bed, nad Also walking halls No focal deficits  Family Communication: None at bedside  Disposition: Status is: Inpatient Remains inpatient appropriate because: Unsafe disposition. CSW working diligently on this difficult-to-place patient. Planned Discharge Destination:  Group home   [place]  Jim Guitar, MD 03/16/2022 6:05 PM Page by [email or web address]

## 2022-03-16 NOTE — TOC Progression Note (Addendum)
Transition of Care [facility]) - Progression Note    Patient Details  Name: Jim Dunn MRN: [id number] Date of Birth: 10-04-1965  Transition of Care [facility]) CM/SW [place], RN Phone Number: 03/16/2022, 9:46 AM  Clinical Narrative:    CM sent dentistry referral to [facility] as part of the patient's admission process to the ALF facility.  The patient's Guardian, Barbarann Ehlers, DSS SW was updated.   I called and spoke with Gwenyth Ober, Administrator for [facility] and she plans to speak with Barbarann Ehlers, [place] this morning regarding admission needs.  Patient will likely discharge to the ALF next week once financial obligations have been met with the facility through [address].    Patient's new address will be:  [address], [place] [address]  03/16/2022 1200 - I called and spoke with Shon Baton, MSW and she states that she spoke with Barbarann Ehlers, DSS SW and patient's bed offer to [facility] is on hold at this time.  I called and left a message with [place], DSS SW to discuss the matter.  I called and left a message with Earlean Polka, MSW with [facility] and she has availability for a male bed.  I called and left a message with Woolard, [place] and MSW with DTP Team will continue to follow the patient for discharge needs to ALF - pending discharge to the facility next week.   Expected Discharge Plan: Assisted Living Barriers to Discharge:  (Pending placement by DSS Guardian)  Expected Discharge Plan and Services Expected Discharge Plan: Assisted Living In-house Referral: Clinical Social Work Discharge Planning Services: Medication Assistance, CM Consult Post Acute Care Choice:  (Needs FCH/ALF placement) Living arrangements for the past 2 months: Single Family Home                                       Social Determinants of Health (SDOH) Interventions     Readmission Risk Interventions    01/18/2022    2:30 PM 01/23/2020    4:46 PM  Readmission Risk Prevention Plan  Transportation Screening Complete Complete  HRI or Home Care Consult  Complete  Social Work Consult for [place] Planning/Counseling  Complete  Palliative Care Screening  Not Applicable  Medication Review Press photographer) Complete   PCP or Specialist appointment within 3-5 days of discharge Complete   HRI or [place] Complete   SW Recovery Care/Counseling Consult Complete   [place] Not Applicable

## 2022-03-16 NOTE — Progress Notes (Signed)
CSW spoke with Cottie Banda of [name] who states the facility does not want to accept the patient if he is unable to pay the "private pay rate" that the facility charges. [name] states the patient's income does not cover the "private pay rate" but it does cover the facility cost at the "Medicaid rate." [name] to continue further discussion with Imani at the facility and will contact CSW with updates.  [name], MSW, LCSW Transitions of Care  Clinical Social Worker II [phone]

## 2022-03-17 DIAGNOSIS — E871 Hypo-osmolality and hyponatremia: Secondary | ICD-10-CM | POA: Diagnosis not present

## 2022-03-17 NOTE — Progress Notes (Signed)
Patient has went off unit with staff member to sit with staff in the discharge lounge area; said staff will return patient to the unit.

## 2022-03-17 NOTE — Progress Notes (Signed)
Progress Note  Patient: Jim Dunn OVF:[id number] DOB: 1965-09-08  DOA: 09/24/2021  DOS: 03/17/2022    Brief hospital course: Aleksandr Pellow is Nanette Wirsing 56 y.o. male with past medical history significant for paranoid schizophrenia, psychogenic polydipsia, HTN, chronic systolic CHF, history of seizures, COPD, alcohol/drug abuse, tobacco abuse who presented to [facility] ED on 1/21 via EMS for altered behavior per family.  Family thought his behavior was off due to possible low sodium has been drinking Nadra Hritz lot of water.  Patient was also endorsing visual hallucinations.  Family also does not feel they can take care of him at home.  His sister reports that her biggest concern is that he burns Kasra Melvin lot of things and urinates on things and does not recall events.  He requires constant supervision and assistance with much of his ADLs.   In the ED, temperature 9 9.2 F, BP 153/88, HR 88, RR 15, SPO2 95% on room air.  Sodium 129 (baseline 121-126), EtOH level less than 10, valproic acid level 19, serum osmolality 262.  Psychiatry, social work consulted.  Patient started IV fluids.  TRH consulted for admission.   Patient's hospital course was complicated by acute symptomatic hyponatremia due to excessive water intake and breakthrough seizures.  Currently patient is medically stable for discharge.  DSS guardian has determined the patient is unsafe to live independently and looking at options for placement at this time.  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 TID on 6/26 and sodium level is normalized durably on recheck of labs 7/7.   Major neurocognitive disorder PTSD Paranoid schizophrenia with hallucinations Dementia without behavioral disturbance Patient lacks decision-making capacity. --Seroquel 25 mg p.o. nightly --Depakote ER 500mg  PO BID --Invega 6mg  PO daily --Xanax 1 mg p.o. twice  daily --Mirtazapine 15 mg p.o. nightly --Social work for placement   Seizure disorder --Depakote ER 500 g p.o. twice daily   Chronic systolic congestive heart failure, compensated TTE 09/26/2021 with LVEF 30-35%, LV moderately decreased function, LV with global hypokinesis, LV mildly dilated, LV diastolic parameters normal, trivial MR, no aortic stenosis, IVC normal in size. --Digoxin 0.25 mg p.o. daily --Entresto increased to 49-51mg  po BID 7/6 --Carvedilol 25 mg p.o. twice daily   Essential hypertension --Amlodipine 10 mg p.o. daily --Carvedilol --Entresto 49-51 mg p.o. twice daily   Hyperlipidemia --Atorvastatin 40 mg p.o. daily   Peripheral neuropathy --Increased lyrica to 75mg  po BID late last week, could increase further this coming week if symptoms uncontrolled.. Ceiling dose would be 100mg  TID.   Tobacco use disorder --Nicotine gum as needed   Obesity Body mass index is Body mass index is 41.25 kg/m.  Outpatient follow-up with PCP.    Subjective: no complaints  Objective: Vitals:   03/16/22 2328 03/17/22 0341 03/17/22 0820 03/17/22 1604  BP: 114/70 (!) 93/48 114/79 (!) 154/91  Pulse: 86 72 74 70  Resp: 20 17 20 18   Temp:  97.8 F (36.6 C) 98.6 F (37 C) 98 F (36.7 C)  TempSrc:   Oral Oral  SpO2: 99% 91% 97% 99%  Weight:      Height:       No distress Moving all extremities Seen walking around the unit  Family Communication: None at bedside  Disposition: Status is: Inpatient Remains inpatient appropriate because: Unsafe disposition. CSW working diligently on this difficult-to-place patient. Planned Discharge Destination:  Group home   03/18/22, MD 03/17/2022 4:32 PM  Page by [email or web address]

## 2022-03-17 NOTE — TOC Progression Note (Addendum)
Transition of Care [facility]) - Progression Note    Patient Details  Name: Nijah Orlich MRN: [id number] Date of Birth: December 21, 1965  Transition of Care [facility]) CM/SW Contact  Curlene Labrum, RN Phone Number: 03/17/2022, 9:22 AM  Clinical Narrative:    CM spoke with Barbarann Ehlers, SW DSS Guardian and she is having continued financial discussions with the patient's family and [facility] about admission costs to the facility.  Admission to [facility] is on hold at this time until financial issues are determined.  I updated the patient and attending physician, Dr. Florene Glen, this morning about hold on the bed offer at this time.   I called and spoke with Barbarann Ehlers, SW Guardian at [facility] and inquired if patient would qualify for Special Assistance for placement at ALF.  The patient does not qualify for this program nor does he qualify for Medicaid due to his disability income being over the limit.  I spoke with Imani, CM at [facility] and she states that she spoke with [facility] yesterday and said that the patient's family has not transferred funds to DSS at this time and was not clear as to the amount in his savings account.  I confirmed with Imani, CM that the private pay rate at the facility is 3200.00 per month and the Medicaid rate at the facility is 1534.00 per month, Day program Monday - Friday is 12.34 per hour for 8 hrs per day and that Medicaid also covers cost of medications and transportation.    I spoke with Pete Pelt, hospital leadership and Herbie Baltimore, Director of Special programs at [facility] and asked that DSS assist with the difference in cost of the facility so that patient can be placed at the facility with current bed offer,  Rolena Infante, MSW sent the details for cost to DSS to have them review and requested patient be part of the special assistance program since this is the only bed offer for placement at this time.  Imani, CM at the facility was  updated that [facility] Team was working on details to assist DSS with placement.  I met with the patient at the bedside to update the patient that the [facility] is continuing to be his patient advocate and have frequent communication with DSS and DSS Guardian for placement at an ALF.  CM and MSW with DTP Team will continue to follow the patient for discharge planning needs.   Expected Discharge Plan: Assisted Living Barriers to Discharge:  (Pending placement by DSS Guardian)  Expected Discharge Plan and Services Expected Discharge Plan: Assisted Living In-house Referral: Clinical Social Work Discharge Planning Services: Medication Assistance, CM Consult Post Acute Care Choice:  (Needs FCH/ALF placement) Living arrangements for the past 2 months: Single Family Home                                       Social Determinants of Health (SDOH) Interventions    Readmission Risk Interventions    01/18/2022    2:30 PM 01/23/2020    4:46 PM  Readmission Risk Prevention Plan  Transportation Screening Complete Complete  HRI or Home Care Consult  Complete  Social Work Consult for [place] Planning/Counseling  Complete  Palliative Care Screening  Not Applicable  Medication Review Press photographer) Complete   PCP or Specialist appointment within 3-5 days of discharge Complete   HRI or [place]  Complete   SW Recovery Care/Counseling Consult Complete   Palliative Care Screening Not Applicable   Skilled Nursing Facility Not Applicable

## 2022-03-18 DIAGNOSIS — E871 Hypo-osmolality and hyponatremia: Secondary | ICD-10-CM | POA: Diagnosis not present

## 2022-03-18 MED ORDER — FLUTICASONE PROPIONATE 50 MCG/ACT NA SUSP
2.0000 | Freq: Every day | NASAL | Status: DC
  Administered 2022-03-18 – 2022-04-07 (×18): 2 via NASAL
  Filled 2022-03-18 (×2): qty 16

## 2022-03-18 NOTE — Progress Notes (Signed)
Progress Note  Patient: Jim Dunn [id number]:[id number] DOB: 08-15-1966  DOA: 09/24/2021  DOS: 03/18/2022    Brief hospital course: Jim Dunn is Jim Dunn 56 y.o. male with past medical history significant for paranoid schizophrenia, psychogenic polydipsia, HTN, chronic systolic CHF, history of seizures, COPD, alcohol/drug abuse, tobacco abuse who presented to [facility] ED on 1/21 via EMS for altered behavior per family.  Family thought his behavior was off due to possible low sodium has been drinking Jim Dunn lot of water.  Patient was also endorsing visual hallucinations.  Family also does not feel they can take care of him at home.  His sister reports that her biggest concern is that he burns Jim Dunn lot of things and urinates on things and does not recall events.  He requires constant supervision and assistance with much of his ADLs.   In the ED, temperature 9 9.2 F, BP 153/88, HR 88, RR 15, SPO2 95% on room air.  Sodium 129 (baseline 121-126), EtOH level less than 10, valproic acid level 19, serum osmolality 262.  Psychiatry, social work consulted.  Patient started IV fluids.  TRH consulted for admission.   Patient's hospital course was complicated by acute symptomatic hyponatremia due to excessive water intake and breakthrough seizures.  Currently patient is medically stable for discharge.  DSS guardian has determined the patient is unsafe to live independently and looking at options for placement at this time.  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 TID on 6/26 and sodium level is normalized durably on recheck of labs 7/7.   Major neurocognitive disorder PTSD Paranoid schizophrenia with hallucinations Dementia without behavioral disturbance Patient lacks decision-making capacity. --Seroquel 25 mg p.o. nightly --Depakote ER 500mg  PO BID --Invega 6mg  PO daily --Xanax 1 mg p.o. twice  daily --Mirtazapine 15 mg p.o. nightly --Social work for placement   Seizure disorder --Depakote ER 500 g p.o. twice daily   Chronic systolic congestive heart failure, compensated TTE 09/26/2021 with LVEF 30-35%, LV moderately decreased function, LV with global hypokinesis, LV mildly dilated, LV diastolic parameters normal, trivial MR, no aortic stenosis, IVC normal in size. --Digoxin 0.25 mg p.o. daily --Entresto increased to 49-51mg  po BID 7/6 --Carvedilol 25 mg p.o. twice daily   Essential hypertension --Amlodipine 10 mg p.o. daily --Carvedilol --Entresto 49-51 mg p.o. twice daily   Hyperlipidemia --Atorvastatin 40 mg p.o. daily   Peripheral neuropathy --Increased lyrica to 75mg  po BID late last week, could increase further this coming week if symptoms uncontrolled.. Ceiling dose would be 100mg  TID.   Tobacco use disorder --Nicotine gum as needed   Obesity Body mass index is Body mass index is 41.25 kg/m.  Outpatient follow-up with PCP.    Rhinorrhea flonase  Subjective: c/o runny nose  Objective: Vitals:   03/18/22 0052 03/18/22 0400 03/18/22 0848 03/18/22 1255  BP: (!) 95/59 (!) 149/75 131/74 139/77  Pulse: 89 74 67 74  Resp: 18 16 16 18   Temp: 97.8 F (36.6 C) 97.6 F (36.4 C) 98 F (36.7 C) 98.3 F (36.8 C)  TempSrc: Oral  Oral Oral  SpO2:  95% 98% 98%  Weight:      Height:       No distress Runny nose today, blowing nose in bedroom Up walking around in the room  Family Communication: None at bedside  Disposition: Status is: Inpatient Remains inpatient appropriate because: Unsafe disposition. CSW working diligently on this difficult-to-place patient. Planned Discharge  Destination:  Group home   Lacretia Nicks, MD 03/18/2022 2:30 PM Page by [email or web address]

## 2022-03-19 ENCOUNTER — Inpatient Hospital Stay (HOSPITAL_COMMUNITY): Payer: Medicare PPO

## 2022-03-19 DIAGNOSIS — E871 Hypo-osmolality and hyponatremia: Secondary | ICD-10-CM | POA: Diagnosis not present

## 2022-03-19 LAB — BASIC METABOLIC PANEL
Anion gap: 10 (ref 5–15)
BUN: 7 mg/dL (ref 6–20)
CO2: 23 mmol/L (ref 22–32)
Calcium: 8.9 mg/dL (ref 8.9–10.3)
Chloride: 101 mmol/L (ref 98–111)
Creatinine, Ser: 0.66 mg/dL (ref 0.61–1.24)
GFR, Estimated: >60 mL/min (ref >60)
Glucose, Bld: 137 mg/dL — ABNORMAL HIGH (ref 70–99)
Potassium: 3.9 mmol/L (ref 3.5–5.1)
Sodium: 134 mmol/L — ABNORMAL LOW (ref 135–145)

## 2022-03-19 LAB — BRAIN NATRIURETIC PEPTIDE: B Natriuretic Peptide: 13 pg/mL (ref 0.0–100.0)

## 2022-03-19 LAB — C-REACTIVE PROTEIN: CRP: 1.8 mg/dL — ABNORMAL HIGH (ref <1.0)

## 2022-03-19 LAB — SARS CORONAVIRUS 2 BY RT PCR: SARS Coronavirus 2 by RT PCR: POSITIVE — AB

## 2022-03-19 NOTE — Progress Notes (Signed)
Patient informed he has to remain in his room due to positive Covid test; no off unit privilege; isolation in place since time of nasal swabbing.

## 2022-03-19 NOTE — Progress Notes (Signed)
Progress Note  Patient: Jim Dunn [id number]:[id number] DOB: 1966/05/15  DOA: 09/24/2021  DOS: 03/19/2022    Brief hospital course: Divon Krabill is Hashem Goynes 56 y.o. male with past medical history significant for paranoid schizophrenia, psychogenic polydipsia, HTN, chronic systolic CHF, history of seizures, COPD, alcohol/drug abuse, tobacco abuse who presented to [facility] ED on 1/21 via EMS for altered behavior per family.  Family thought his behavior was off due to possible low sodium has been drinking Shiron Whetsel lot of water.  Patient was also endorsing visual hallucinations.  Family also does not feel they can take care of him at home.  His sister reports that her biggest concern is that he burns Zoraya Fiorenza lot of things and urinates on things and does not recall events.  He requires constant supervision and assistance with much of his ADLs.   In the ED, temperature 9 9.2 F, BP 153/88, HR 88, RR 15, SPO2 95% on room air.  Sodium 129 (baseline 121-126), EtOH level less than 10, valproic acid level 19, serum osmolality 262.  Psychiatry, social work consulted.  Patient started IV fluids.  TRH consulted for admission.   Patient's hospital course was complicated by acute symptomatic hyponatremia due to excessive water intake and breakthrough seizures.  Currently patient is medically stable for discharge.  DSS guardian has determined the patient is unsafe to live independently and looking at options for placement at this time.  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 TID on 6/26 and sodium level is normalized durably on recheck of labs 7/7.   Major neurocognitive disorder PTSD Paranoid schizophrenia with hallucinations Dementia without behavioral disturbance Patient lacks decision-making capacity. --Seroquel 25 mg p.o. nightly --Depakote ER 500mg  PO BID --Invega 6mg  PO daily --Xanax 1 mg p.o. twice  daily --Mirtazapine 15 mg p.o. nightly --Social work for placement   Seizure disorder --Depakote ER 500 g p.o. twice daily   Chronic systolic congestive heart failure, compensated TTE 09/26/2021 with LVEF 30-35%, LV moderately decreased function, LV with global hypokinesis, LV mildly dilated, LV diastolic parameters normal, trivial MR, no aortic stenosis, IVC normal in size. --Digoxin 0.25 mg p.o. daily --Entresto increased to 49-51mg  po BID 7/6 --Carvedilol 25 mg p.o. twice daily   Essential hypertension --Amlodipine 10 mg p.o. daily --Carvedilol --Entresto 49-51 mg p.o. twice daily   Hyperlipidemia --Atorvastatin 40 mg p.o. daily   Peripheral neuropathy --Increased lyrica to 75mg  po BID late last week, could increase further this coming week if symptoms uncontrolled.. Ceiling dose would be 100mg  TID.   Tobacco use disorder --Nicotine gum as needed   Obesity Body mass index is Body mass index is 41.25 kg/m.  Outpatient follow-up with PCP.    Rhinorrhea Flonase Test for covid   Subjective: c/o runny nose  Objective: Vitals:   03/18/22 0848 03/18/22 1255 03/18/22 2034 03/19/22 0807  BP: 131/74 139/77 (!) 157/95 138/72  Pulse: 67 74 80 69  Resp: 16 18 16 18   Temp: 98 F (36.7 C) 98.3 F (36.8 C) 98 F (36.7 C) 98.2 F (36.8 C)  TempSrc: Oral Oral Oral   SpO2: 98% 98% 98%   Weight:      Height:       Runny nose persists Sitting up at edge of bed NAD Occasional cough noted, unlabored breathing  Family Communication: None at bedside  Disposition: Status is: Inpatient Remains inpatient appropriate because: Unsafe disposition. CSW working diligently on this difficult-to-place  patient. Planned Discharge Destination:  Group home   Lacretia Nicks, MD 03/19/2022 11:57 AM Page by [email or web address]

## 2022-03-20 DIAGNOSIS — E871 Hypo-osmolality and hyponatremia: Secondary | ICD-10-CM | POA: Diagnosis not present

## 2022-03-20 LAB — CBC WITH DIFFERENTIAL/PLATELET
Abs Immature Granulocytes: 0 10*3/uL (ref 0.00–0.07)
Basophils Absolute: 0 10*3/uL (ref 0.0–0.1)
Basophils Relative: 0 %
Eosinophils Absolute: 0.2 10*3/uL (ref 0.0–0.5)
Eosinophils Relative: 2 %
HCT: 37.8 % — ABNORMAL LOW (ref 39.0–52.0)
Hemoglobin: 12.4 g/dL — ABNORMAL LOW (ref 13.0–17.0)
Lymphocytes Relative: 42 %
Lymphs Abs: 3.2 10*3/uL (ref 0.7–4.0)
MCH: 22.7 pg — ABNORMAL LOW (ref 26.0–34.0)
MCHC: 32.8 g/dL (ref 30.0–36.0)
MCV: 69.2 fL — ABNORMAL LOW (ref 80.0–100.0)
Monocytes Absolute: 0.6 10*3/uL (ref 0.1–1.0)
Monocytes Relative: 8 %
Neutro Abs: 3.6 10*3/uL (ref 1.7–7.7)
Neutrophils Relative %: 48 %
Platelets: 142 10*3/uL — ABNORMAL LOW (ref 150–400)
RBC: 5.46 MIL/uL (ref 4.22–5.81)
RDW: 15.6 % — ABNORMAL HIGH (ref 11.5–15.5)
WBC: 7.6 10*3/uL (ref 4.0–10.5)
nRBC: 0 % (ref 0.0–0.2)
nRBC: 0 /100 WBC

## 2022-03-20 LAB — COMPREHENSIVE METABOLIC PANEL
ALT: 13 U/L (ref 0–44)
AST: 18 U/L (ref 15–41)
Albumin: 3.7 g/dL (ref 3.5–5.0)
Alkaline Phosphatase: 54 U/L (ref 38–126)
Anion gap: 10 (ref 5–15)
BUN: 8 mg/dL (ref 6–20)
CO2: 22 mmol/L (ref 22–32)
Calcium: 8.5 mg/dL — ABNORMAL LOW (ref 8.9–10.3)
Chloride: 102 mmol/L (ref 98–111)
Creatinine, Ser: 0.8 mg/dL (ref 0.61–1.24)
GFR, Estimated: >60 mL/min (ref >60)
Glucose, Bld: 91 mg/dL (ref 70–99)
Potassium: 4.1 mmol/L (ref 3.5–5.1)
Sodium: 134 mmol/L — ABNORMAL LOW (ref 135–145)
Total Bilirubin: 0.4 mg/dL (ref 0.3–1.2)
Total Protein: 6.7 g/dL (ref 6.5–8.1)

## 2022-03-20 LAB — MAGNESIUM: Magnesium: 1.8 mg/dL (ref 1.7–2.4)

## 2022-03-20 LAB — C-REACTIVE PROTEIN: CRP: 1.2 mg/dL — ABNORMAL HIGH (ref <1.0)

## 2022-03-20 LAB — PHOSPHORUS: Phosphorus: 3.3 mg/dL (ref 2.5–4.6)

## 2022-03-20 MED ORDER — ENOXAPARIN SODIUM 40 MG/0.4ML IJ SOSY
40.0000 mg | PREFILLED_SYRINGE | INTRAMUSCULAR | Status: DC
  Administered 2022-03-20: 40 mg via SUBCUTANEOUS
  Filled 2022-03-20: qty 0.4

## 2022-03-20 MED ORDER — MOLNUPIRAVIR EUA 200MG CAPSULE
4.0000 | ORAL_CAPSULE | Freq: Two times a day (BID) | ORAL | Status: AC
Start: 2022-03-20 — End: 2022-03-25
  Administered 2022-03-20 – 2022-03-25 (×10): 800 mg via ORAL
  Filled 2022-03-20: qty 4

## 2022-03-20 NOTE — TOC Progression Note (Addendum)
Transition of Care [facility]) - Progression Note    Patient Details  Name: Jim Dunn MRN: [id number] Date of Birth: 10-02-1965  Transition of Care [facility]) CM/SW Contact  Janae Bridgeman, RN Phone Number: 03/20/2022, 9:32 AM  Clinical Narrative:    CM called and spoke with Darden Dates, DSS SW with [facility] and DSS is unable to provide special funding to assist with admission to [facility] at [place], [place] facility.  Shon Baton, [facility] Supervisor spoke with Rodena Medin, DSS Supervisor last week to inquire about special funding but at this time- no funds are available to support payment to the facility.  DTP Team will continue to explore options for ALF placement at [facility] rate.   [place], DSS Guardian states that the patient's family will transfer access to patient's social security check to DSS today, 03/22/2022.  [place] asks that [facility] Team reach out to available ALF/ Group homes for Medicaid rate payment at this time.  [facility] - No bed availability [facility] - Left message with Trenton Gammon, Owner of home [facility] - Left message with Earleen [place], [place] [phone] [facility]  - spoke with Gelene Mink - No beds available at this time  I called and spoke with Sallyanne Kuster, CM at [facility] and she plans to call Darden Dates, DSS SW directly and speak directly with her to discuss financial arrangement, if feasible under the Medicaid rate - Will follow to needs if the facility is willing to consider for admission.  Patient tested positive for COVID on 03/19/22 and will need non-contagious to other ALF residents prior to discharge - attending physician, Dr. Elijah Birk is aware.  No bed offer for placement at [facility] at this time.   Expected Discharge Plan: Assisted Living Barriers to Discharge:  (Pending placement by DSS Guardian)  Expected Discharge Plan and Services Expected Discharge Plan: Assisted Living In-house Referral:  Clinical Social Work Discharge Planning Services: Medication Assistance, CM Consult Post Acute Care Choice:  (Needs FCH/ALF placement) Living arrangements for the past 2 months: Single Family Home                                       Social Determinants of Health (SDOH) Interventions    Readmission Risk Interventions    01/18/2022    2:30 PM 01/23/2020    4:46 PM  Readmission Risk Prevention Plan  Transportation Screening Complete Complete  HRI or Home Care Consult  Complete  Social Work Consult for Recovery Care Planning/Counseling  Complete  Palliative Care Screening  Not Applicable  Medication Review Oceanographer) Complete   PCP or Specialist appointment within 3-5 days of discharge Complete   HRI or Home Care Consult Complete   SW Recovery Care/Counseling Consult Complete   Palliative Care Screening Not Applicable   Skilled Nursing Facility Not Applicable

## 2022-03-20 NOTE — Progress Notes (Signed)
Progress Note  Patient: Jim Dunn [id number]:[id number] DOB: [birth year]  DOA: 09/24/2021  DOS: 03/20/2022    Brief hospital course: Jim Dunn is Jim Dunn 56 y.o. male with past medical history significant for paranoid schizophrenia, psychogenic polydipsia, HTN, chronic systolic CHF, history of seizures, COPD, alcohol/drug abuse, tobacco abuse who presented to [facility] ED on 1/21 via EMS for altered behavior per family.  Family thought his behavior was off due to possible low sodium has been drinking Jim Dunn lot of water.  Patient was also endorsing visual hallucinations.  Family also does not feel they can take care of him at home.  His sister reports that her biggest concern is that he burns Jim Dunn lot of things and urinates on things and does not recall events.  He requires constant supervision and assistance with much of his ADLs.   In the ED, temperature 9 9.2 F, BP 153/88, HR 88, RR 15, SPO2 95% on room air.  Sodium 129 (baseline 121-126), EtOH level less than 10, valproic acid level 19, serum osmolality 262.  Psychiatry, social work consulted.  Patient started IV fluids.  TRH consulted for admission.   Patient's hospital course was complicated by acute symptomatic hyponatremia due to excessive water intake and breakthrough seizures.  Currently patient is medically stable for discharge.  DSS guardian has determined the patient is unsafe to live independently and looking at options for placement at this time.  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 TID on 6/26  and sodium level is normalized durably on recheck of labs 7/7.   Major neurocognitive disorder PTSD Paranoid schizophrenia with hallucinations Dementia without behavioral disturbance Patient lacks decision-making capacity. --Seroquel 25 mg p.o. nightly --Depakote ER 500mg  PO BID --Invega 6mg  PO daily --Xanax 1 mg p.o. twice daily --Mirtazapine 15 mg p.o. nightly --Social work for placement   Seizure disorder --Depakote ER 500 g p.o. twice daily   Chronic systolic congestive heart failure, compensated TTE 09/26/2021 with LVEF 30-35%, LV moderately decreased function, LV with global hypokinesis, LV mildly dilated, LV diastolic parameters normal, trivial MR, no aortic stenosis, IVC normal in size. --Digoxin 0.25 mg p.o. daily --Entresto increased to 49-51mg  po BID 7/6 --Carvedilol 25 mg p.o. twice daily   Essential hypertension --Amlodipine 10 mg p.o. daily --Carvedilol --Entresto 49-51 mg p.o. twice daily   Hyperlipidemia --Atorvastatin 40 mg p.o. daily   Peripheral neuropathy --Increased lyrica to 75mg  po BID late last week, could increase further this coming week if symptoms uncontrolled.. Ceiling dose would be 100mg  TID.   Tobacco use disorder --Nicotine gum as needed   Obesity Body mass index is Body mass index is 41.25 kg/m.  Outpatient follow-up with PCP.    Subjective: c/o runny nose  Objective: Vitals:   03/20/22 0400 03/20/22 0747 03/20/22 1025 03/20/22 1150  BP: 136/77 124/88  117/71  Pulse: 84 100 90 98  Resp: 18   19  Temp: 98.9 F (37.2 C) (!) 100.8 F (38.2 C)  (!) 101.1 F (38.4 C)  TempSrc: Oral Oral  Oral  SpO2: 97% 95%  98%  Weight:      Height:       Lying in bed, NAD Unlabored breathing, occasional cough RRR No LEE  Family Communication: None at bedside  Disposition: Status is: Inpatient Remains inpatient appropriate because: Unsafe disposition. CSW working diligently on this difficult-to-place patient. Planned Discharge  Destination:  Group home   Lacretia Nicks, MD 03/20/2022 2:55 PM Page by [email or web address]

## 2022-03-21 DIAGNOSIS — E871 Hypo-osmolality and hyponatremia: Secondary | ICD-10-CM | POA: Diagnosis not present

## 2022-03-21 LAB — MAGNESIUM: Magnesium: 2.1 mg/dL (ref 1.7–2.4)

## 2022-03-21 LAB — COMPREHENSIVE METABOLIC PANEL
ALT: 19 U/L (ref 0–44)
AST: 25 U/L (ref 15–41)
Albumin: 3.7 g/dL (ref 3.5–5.0)
Alkaline Phosphatase: 54 U/L (ref 38–126)
Anion gap: 12 (ref 5–15)
BUN: 15 mg/dL (ref 6–20)
CO2: 22 mmol/L (ref 22–32)
Calcium: 8.6 mg/dL — ABNORMAL LOW (ref 8.9–10.3)
Chloride: 101 mmol/L (ref 98–111)
Creatinine, Ser: 1.03 mg/dL (ref 0.61–1.24)
GFR, Estimated: >60 mL/min (ref >60)
Glucose, Bld: 124 mg/dL — ABNORMAL HIGH (ref 70–99)
Potassium: 3.7 mmol/L (ref 3.5–5.1)
Sodium: 135 mmol/L (ref 135–145)
Total Bilirubin: 0.5 mg/dL (ref 0.3–1.2)
Total Protein: 7 g/dL (ref 6.5–8.1)

## 2022-03-21 LAB — CBC WITH DIFFERENTIAL/PLATELET
Abs Immature Granulocytes: 0.02 10*3/uL (ref 0.00–0.07)
Basophils Absolute: 0 10*3/uL (ref 0.0–0.1)
Basophils Relative: 1 %
Eosinophils Absolute: 0.2 10*3/uL (ref 0.0–0.5)
Eosinophils Relative: 3 %
HCT: 39.5 % (ref 39.0–52.0)
Hemoglobin: 12.7 g/dL — ABNORMAL LOW (ref 13.0–17.0)
Immature Granulocytes: 0 %
Lymphocytes Relative: 39 %
Lymphs Abs: 2.2 10*3/uL (ref 0.7–4.0)
MCH: 22.4 pg — ABNORMAL LOW (ref 26.0–34.0)
MCHC: 32.2 g/dL (ref 30.0–36.0)
MCV: 69.7 fL — ABNORMAL LOW (ref 80.0–100.0)
Monocytes Absolute: 1.5 10*3/uL — ABNORMAL HIGH (ref 0.1–1.0)
Monocytes Relative: 26 %
Neutro Abs: 1.7 10*3/uL (ref 1.7–7.7)
Neutrophils Relative %: 31 %
Platelets: 137 10*3/uL — ABNORMAL LOW (ref 150–400)
RBC: 5.67 MIL/uL (ref 4.22–5.81)
RDW: 16.1 % — ABNORMAL HIGH (ref 11.5–15.5)
WBC: 5.7 10*3/uL (ref 4.0–10.5)
nRBC: 0 % (ref 0.0–0.2)

## 2022-03-21 LAB — PHOSPHORUS: Phosphorus: 3.9 mg/dL (ref 2.5–4.6)

## 2022-03-21 MED ORDER — ALPRAZOLAM 0.5 MG PO TABS
1.0000 mg | ORAL_TABLET | Freq: Once | ORAL | Status: DC | PRN
Start: 2022-03-21 — End: 2022-04-07

## 2022-03-21 MED ORDER — ENOXAPARIN SODIUM 60 MG/0.6ML IJ SOSY
60.0000 mg | PREFILLED_SYRINGE | INTRAMUSCULAR | Status: DC
  Administered 2022-03-21 – 2022-03-28 (×8): 60 mg via SUBCUTANEOUS
  Filled 2022-03-21 (×12): qty 0.6

## 2022-03-21 NOTE — TOC Progression Note (Signed)
Transition of Care [facility]) - Progression Note    Patient Details  Name: Jim Dunn MRN: [id number] Date of Birth: 1965/12/18  Transition of Care [facility]) CM/SW Contact  Janae Bridgeman, RN Phone Number: 03/21/2022, 10:14 AM  Clinical Narrative:    CM called and left a message with Trenton Gammon, Owner of [facility] home to inquire if she was able to conduct a Video Conference with the patient through [facility] Team assistance since the patient was currently under respiratory isolation due to COVID infection.  CM and MSW with DTP Team will continue to follow the patient for discharge planning needs. - No Group home/ ALF available at this time.   Expected Discharge Plan: Assisted Living Barriers to Discharge:  (Pending placement by DSS Guardian)  Expected Discharge Plan and Services Expected Discharge Plan: Assisted Living In-house Referral: Clinical Social Work Discharge Planning Services: Medication Assistance, CM Consult Post Acute Care Choice:  (Needs FCH/ALF placement) Living arrangements for the past 2 months: Single Family Home                                       Social Determinants of Health (SDOH) Interventions    Readmission Risk Interventions    01/18/2022    2:30 PM 01/23/2020    4:46 PM  Readmission Risk Prevention Plan  Transportation Screening Complete Complete  HRI or Home Care Consult  Complete  Social Work Consult for Recovery Care Planning/Counseling  Complete  Palliative Care Screening  Not Applicable  Medication Review Oceanographer) Complete   PCP or Specialist appointment within 3-5 days of discharge Complete   HRI or Home Care Consult Complete   SW Recovery Care/Counseling Consult Complete   Palliative Care Screening Not Applicable   Skilled Nursing Facility Not Applicable

## 2022-03-21 NOTE — Progress Notes (Signed)
Progress Note  Patient: Jim Dunn [id number]:[id number] DOB: 07-19-1966  DOA: 09/24/2021  DOS: 03/21/2022    Brief hospital course: Jim Dunn is Jim Dunn 56 y.o. male with past medical history significant for paranoid schizophrenia, psychogenic polydipsia, HTN, chronic systolic CHF, history of seizures, COPD, alcohol/drug abuse, tobacco abuse who presented to [facility] ED on 1/21 via EMS for altered behavior per family.  Family thought his behavior was off due to possible low sodium has been drinking Jim Dunn lot of water.  Patient was also endorsing visual hallucinations.  Family also does not feel they can take care of him at home.  His sister reports that her biggest concern is that he burns Jim Dunn lot of things and urinates on things and does not recall events.  He requires constant supervision and assistance with much of his ADLs.   In the ED, temperature 9 9.2 F, BP 153/88, HR 88, RR 15, SPO2 95% on room air.  Sodium 129 (baseline 121-126), EtOH level less than 10, valproic acid level 19, serum osmolality 262.  Psychiatry, social work consulted.  Patient started IV fluids.  TRH consulted for admission.   Patient's hospital course was complicated by acute symptomatic hyponatremia due to excessive water intake and breakthrough seizures.  Currently patient is medically stable for discharge.  DSS guardian has determined the patient is unsafe to live independently and looking at options for placement at this time.  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 TID  on 6/26 and sodium level is normalized durably on recheck of labs 7/7.   Major neurocognitive disorder PTSD Paranoid schizophrenia with hallucinations Dementia without behavioral disturbance Patient lacks decision-making capacity. --Seroquel 25 mg p.o. nightly --Depakote ER 500mg  PO BID --Invega 6mg  PO daily --Xanax 1 mg p.o. twice daily --Mirtazapine 15 mg p.o. nightly --Social work for placement   Seizure disorder --Depakote ER 500 g p.o. twice daily   Chronic systolic congestive heart failure, compensated TTE 09/26/2021 with LVEF 30-35%, LV moderately decreased function, LV with global hypokinesis, LV mildly dilated, LV diastolic parameters normal, trivial MR, no aortic stenosis, IVC normal in size. --Digoxin 0.25 mg p.o. daily --Entresto increased to 49-51mg  po BID 7/6 --Carvedilol 25 mg p.o. twice daily   Essential hypertension --Amlodipine 10 mg p.o. daily --Carvedilol --Entresto 49-51 mg p.o. twice daily   Hyperlipidemia --Atorvastatin 40 mg p.o. daily   Peripheral neuropathy --Increased lyrica to 75mg  po BID late last week, could increase further this coming week if symptoms uncontrolled.. Ceiling dose would be 100mg  TID.   Tobacco use disorder --Nicotine gum as needed   Obesity Body mass index is Body mass index is 41.25 kg/m.  Outpatient follow-up with PCP.    Subjective: notes he's feeling better   Objective: Vitals:   03/20/22 2145 03/21/22 0255 03/21/22 0819 03/21/22 1157  BP: (!) 143/71 (!) 90/54 106/88 (!) 108/58  Pulse: 97 77 74 73  Resp: 20 20 20 18   Temp: 99.9 F (37.7 C) 98.6 F (37 C) 98.8 F (37.1 C) 98.4 F (36.9 C)  TempSrc: Oral Axillary Axillary Oral  SpO2: 97% 95% 97% 93%  Weight:      Height:       Lying in bed, NAD Unlabored breathing RRR No LEE Moving all extremities  Family Communication: None at bedside  Disposition: Status is: Inpatient Remains inpatient appropriate because: Unsafe disposition. CSW working diligently  on this difficult-to-place patient. Planned Discharge Destination:  Group home   Lacretia Nicks, MD 03/21/2022 5:09 PM Page by [email or web address]

## 2022-03-22 DIAGNOSIS — I5022 Chronic systolic (congestive) heart failure: Secondary | ICD-10-CM | POA: Diagnosis not present

## 2022-03-22 DIAGNOSIS — F0390 Unspecified dementia without behavioral disturbance: Secondary | ICD-10-CM | POA: Diagnosis not present

## 2022-03-22 DIAGNOSIS — E871 Hypo-osmolality and hyponatremia: Secondary | ICD-10-CM | POA: Diagnosis not present

## 2022-03-22 DIAGNOSIS — K5900 Constipation, unspecified: Secondary | ICD-10-CM | POA: Diagnosis not present

## 2022-03-22 LAB — C-REACTIVE PROTEIN: CRP: 3 mg/dL — ABNORMAL HIGH (ref <1.0)

## 2022-03-22 NOTE — Plan of Care (Signed)

## 2022-03-22 NOTE — TOC Progression Note (Signed)
Transition of Care [facility]) - Progression Note    Patient Details  Name: Jim Dunn MRN: [id number] Date of Birth: July 24, 1966  Transition of Care [facility]) CM/SW Contact  Janae Bridgeman, RN Phone Number: 03/22/2022, 9:11 AM  Clinical Narrative:    CM called and spoke with Darden Dates, DSS SW/ Guardian, this morning and she states that she has continued to call multiple [facility]/ Group homes for bed availability and at this time, she has not found bed availability for the patient.  She is aware that the patient tested positive for COVID on Sunday, 03/19/22.  CM and MSW with DTP Team will continue to follow the patient for discharge planning needs.   Expected Discharge Plan: Assisted Living Barriers to Discharge:  (Pending placement by DSS Guardian)  Expected Discharge Plan and Services Expected Discharge Plan: Assisted Living In-house Referral: Clinical Social Work Discharge Planning Services: Medication Assistance, CM Consult Post Acute Care Choice:  (Needs FCH/ALF placement) Living arrangements for the past 2 months: Single Family Home                                       Social Determinants of Health (SDOH) Interventions    Readmission Risk Interventions    01/18/2022    2:30 PM 01/23/2020    4:46 PM  Readmission Risk Prevention Plan  Transportation Screening Complete Complete  HRI or Home Care Consult  Complete  Social Work Consult for Recovery Care Planning/Counseling  Complete  Palliative Care Screening  Not Applicable  Medication Review Oceanographer) Complete   PCP or Specialist appointment within 3-5 days of discharge Complete   HRI or Home Care Consult Complete   SW Recovery Care/Counseling Consult Complete   Palliative Care Screening Not Applicable   Skilled Nursing Facility Not Applicable
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
## 2022-03-22 NOTE — Progress Notes (Addendum)
CSW spoke with [employer] who states that the patient's income would cover the cost of room and board at the facility but that since the patient does not have income he would not be eligible for transportation and the day program.   [name], MSW, LCSW Transitions of Care  Clinical Social Worker II [phone]

## 2022-03-22 NOTE — Plan of Care (Signed)

## 2022-03-23 DIAGNOSIS — E871 Hypo-osmolality and hyponatremia: Secondary | ICD-10-CM | POA: Diagnosis not present
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
## 2022-03-24 DIAGNOSIS — E871 Hypo-osmolality and hyponatremia: Secondary | ICD-10-CM | POA: Diagnosis not present

## 2022-03-24 NOTE — Progress Notes (Signed)
PROGRESS NOTE  [name]  DOB: September 30, 1965  PCP: Aviva Kluver BMW:[id number]  DOA: 09/24/2021  LOS: 176 days  Hospital Day: 182  Brief narrative: Jim Dunn is a 56 y.o. male with PMH significant for schizophrenia, chronic hyponatremia, seizure disorder, tobacco use, systolic congestive heart failure, COPD who was brought to ED on 09/24/2021 by his family after they were unable to take care of him.  He was drinking a lot of water and trying to burn things. Previously he was staying at group home and was kicked out.   Neurology and psychiatry were consulted.   Hospital course remarkable for acute on chronic hyponatremia, breakthrough seizures, COVID infection.  Prolonged hospitalization.   TOC following.  APS in process to determine if they will pursue guardianship.  Subjective: Patient was seen and examined this afternoon.  Lying on bed.  He usually likes to walk around to different units in the hospital.  But for last 1 week, he has been confined in his room only because of COVID.    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 -Monitored in ICU for 24 hours -Mental status back to baseline now -Continue Depakote 500 mg twice daily, and Lyrica 75 mg 2 times daily.  Ativan as needed  Chronic hypervolemic hyponatremia Psychogenic polydipsia -Sodium trend as below.  Last sodium level 135 on last check on 7/18. -During this hospitalization, he has been tried on salt tablets, fluid restriction, Lasix.   But he does not follow the recommendation of fluid restriction. -Currently patient is not on Lasix or salt tablets. -fluid restriction at 1500 mL/day. Recent Labs  Lab 03/19/22 1547 03/20/22 0500 03/21/22 0500  NA 134* 134* 135   Wandering behavior Underlying dementia with behavioral disturbance Paranoid schizophrenia with hallucination -Continue Invega 3 mg daily, Remeron 15 mg daily at bedtime, Seroquel 50 mg at bedtime, -As needed Haldol IM/IV  Chronic systolic CHF -Remains compensated.   -Echo 1/23 with EF 30-35% with moderately decreased systolic function and global hypokinesis. RV size and systolic function is normal. -Currently on Coreg 25 mg twice daily, digoxin 0.25 mg daily, Entresto 24/26 mg twice daily.  Lasix on hold at this time.  COVID 19 positive  -no clinical signs of viral pneumonia,  -he tested positive on 07/16 will need 10 days of isolation.  -Continue with molnupiravir for total of 5 days.   COPD -As needed DuoNeb  HLD -Continue Lipitor  Constipation -Bowel regimen: Senokot twice daily, MiraLAX as needed  Peripheral neuropathy -Lyrica  Goals of care   Code Status: Full Code   Nutritional status:  Body mass index is 41.25 kg/m.          Diet:  Diet Order             Diet regular Room service appropriate? No; Fluid consistency: Thin; Fluid restriction: 1200 mL Fluid  Diet effective 1000                   DVT prophylaxis:  Place and maintain sequential compression device Start: 11/01/21 1711  Antimicrobials: None Fluid: None Consultants: None Family Communication: None at bedside.   Status is: Inpatient  Continue in-hospital care because: Pending safe discharge plan Level of care: Med-Surg   Dispo: The patient is from: Home              Anticipated d/c is to: Pending safe discharge plan.  APS involved              Patient currently is not medically stable to d/c.   Difficult to place patient No     Infusions:     Scheduled Meds:  ALPRAZolam  1 mg Oral BID   amLODipine  10 mg Oral Daily   atorvastatin  40 mg Oral Daily   carvedilol  25 mg Oral BID WC   digoxin  0.25 mg Oral Daily   divalproex  500 mg Oral BID   enoxaparin (LOVENOX) injection  60 mg Subcutaneous Q24H   fluticasone  2 spray Each Nare Daily   melatonin  3 mg Oral QHS   mirtazapine  15 mg Oral QHS   molnupiravir EUA  4 capsule Oral BID   paliperidone  6 mg Oral Daily   polyethylene glycol  17 g Oral BID   pregabalin  75 mg Oral BID   QUEtiapine  50 mg Oral QHS   sacubitril-valsartan  1 tablet Oral BID   senna-docusate  2 tablet Oral BID    PRN meds: acetaminophen **OR** [DISCONTINUED] acetaminophen, ALPRAZolam, bisacodyl, nicotine polacrilex, mouth rinse   Antimicrobials: Anti-infectives (From admission, onward)    Start     Dose/Rate Route Frequency Ordered Stop   03/20/22 2200  molnupiravir EUA (LAGEVRIO) capsule 800 mg        4 capsule Oral 2 times daily 03/20/22 1518 03/25/22 2159       Objective: Vitals:   03/23/22 1700 03/23/22 2150  BP: 118/83 135/89  Pulse: 88 84  Resp: 18 18  Temp: 98.3 F (36.8 C) 98.5 F (36.9 C)  SpO2: 100% 97%    Intake/Output Summary (Last 24 hours) at 03/24/2022 1450 Last data filed at 03/24/2022 0514 Gross per 24 hour  Intake 240 ml  Output --  Net 240 ml   Filed Weights   01/10/22 0432 01/11/22 0500 02/27/22 1700  Weight: 115.6 kg 116.7 kg 123.1 kg   Weight change:  Body mass index is 41.25 kg/m.   Physical Exam: General exam: Middle-aged African-American male.  Not in physical distress Skin: No rashes, lesions or ulcers. HEENT: Atraumatic, normocephalic, no obvious bleeding Lungs: Clear to auscultation bilaterally CVS: Regular rate and rhythm, no murmur GI/Abd soft, nontender, nondistended, bowel sounds CNS: Alert, awake, oriented to place and person Psychiatry: Mood appropriate Extremities: No pedal edema, no calf tenderness  Data Review: I have  personally reviewed the laboratory data and studies available.  F/u labs ordered Unresulted Labs (From admission, onward)     Start     Ordered   03/24/22 0500  Basic metabolic panel  Tomorrow morning,   R       Question:  Specimen collection method  Answer:  Lab=Lab collect   03/23/22 1722            Signed, Lorin Glass, MD [facility] 03/24/2022

## 2022-03-25 DIAGNOSIS — E871 Hypo-osmolality and hyponatremia: Secondary | ICD-10-CM | POA: Diagnosis not present

## 2022-03-25 NOTE — Progress Notes (Signed)
PROGRESS NOTE  [name]  DOB: [birth year]  PCP: Aviva Kluver MLY:[id number]  DOA: 09/24/2021  LOS: 177 days  Hospital Day: 183  Brief narrative: Jim Dunn is a 56 y.o. male with PMH significant for schizophrenia, chronic hyponatremia, seizure disorder, tobacco use, systolic congestive heart failure, COPD who was brought to ED on 09/24/2021 by his family after they were unable to take care of him.  He was drinking a lot of water and trying to burn things. Previously he was staying at group home and was kicked out.   Neurology and psychiatry were consulted.   Hospital course remarkable for acute on chronic hyponatremia, breakthrough seizures, COVID infection.  Prolonged hospitalization.   TOC following.  APS in process to determine if they will pursue guardianship.  Subjective: Patient was seen and examined this morning.  Sitting up in chair.  Eating breakfast.  Not in distress.  Not on supplemental oxygen.  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 -Monitored in ICU for 24 hours -Mental status back to baseline now -Continue Depakote 500 mg twice daily, and Lyrica 75 mg 2 times daily.  Ativan as needed  Chronic hypervolemic hyponatremia Psychogenic polydipsia -Sodium trend as below.  Last sodium level 135 on last check on 7/18. -During this hospitalization, he has been tried on salt tablets, fluid restriction, Lasix.  But he does not follow the recommendation of fluid restriction. -Currently  patient is not on Lasix or salt tablets. -fluid restriction at 1500 mL/day. Recent Labs  Lab 03/19/22 1547 03/20/22 0500 03/21/22 0500  NA 134* 134* 135    Wandering behavior Underlying dementia with behavioral disturbance Paranoid schizophrenia with hallucination -Continue Invega 3 mg daily, Remeron 15 mg daily at bedtime, Seroquel 50 mg at bedtime, -As needed Haldol IM/IV  Chronic systolic CHF -Remains compensated.   -Echo 1/23 with EF 30-35% with moderately decreased systolic function and global hypokinesis. RV size and systolic function is normal. -Currently on Coreg 25 mg twice daily, digoxin 0.25 mg daily, Entresto 24/26 mg twice daily.  Lasix on hold at this time.  COVID 19 positive  -no clinical signs of viral pneumonia,  -he tested positive on 07/16 will need 10 days of isolation.  -Completed 5-day course of molnupiravir COPD -As needed DuoNeb  HLD -Continue Lipitor  Constipation -Bowel regimen: Senokot twice daily, MiraLAX as needed  Peripheral neuropathy -Lyrica  Goals of care   Code Status: Full Code   Nutritional status:  Body mass index is 41.25 kg/m.          Diet:  Diet Order             Diet regular Room service appropriate? No; Fluid consistency: Thin; Fluid restriction: 1200 mL Fluid  Diet effective 1000                   DVT prophylaxis:  Place and maintain sequential compression device Start: 11/01/21 1711   Antimicrobials: None Fluid: None Consultants: None Family Communication: None at bedside.   Status is: Inpatient  Continue in-hospital care because:  Pending safe discharge plan Level of care: Med-Surg   Dispo: The patient is from: Home              Anticipated d/c is to: Pending safe discharge plan.  APS involved              Patient currently is not medically stable to d/c.   Difficult to place patient No     Infusions:    Scheduled Meds:  ALPRAZolam  1 mg Oral BID   amLODipine  10 mg Oral Daily    atorvastatin  40 mg Oral Daily   carvedilol  25 mg Oral BID WC   digoxin  0.25 mg Oral Daily   divalproex  500 mg Oral BID   enoxaparin (LOVENOX) injection  60 mg Subcutaneous Q24H   fluticasone  2 spray Each Nare Daily   melatonin  3 mg Oral QHS   mirtazapine  15 mg Oral QHS   paliperidone  6 mg Oral Daily   polyethylene glycol  17 g Oral BID   pregabalin  75 mg Oral BID   QUEtiapine  50 mg Oral QHS   sacubitril-valsartan  1 tablet Oral BID   senna-docusate  2 tablet Oral BID    PRN meds: acetaminophen **OR** [DISCONTINUED] acetaminophen, ALPRAZolam, bisacodyl, nicotine polacrilex, mouth rinse   Antimicrobials: Anti-infectives (From admission, onward)    Start     Dose/Rate Route Frequency Ordered Stop   03/20/22 2200  molnupiravir EUA (LAGEVRIO) capsule 800 mg        4 capsule Oral 2 times daily 03/20/22 1518 03/25/22 0858       Objective: Vitals:   03/25/22 0856 03/25/22 1316  BP: (!) 138/106 119/81  Pulse: 73 77  Resp: 19 20  Temp: 98.4 F (36.9 C) (!) 97.4 F (36.3 C)  SpO2: 96% 98%    Intake/Output Summary (Last 24 hours) at 03/25/2022 1449 Last data filed at 03/25/2022 0855 Gross per 24 hour  Intake 480 ml  Output --  Net 480 ml    Filed Weights   01/10/22 0432 01/11/22 0500 02/27/22 1700  Weight: 115.6 kg 116.7 kg 123.1 kg   Weight change:  Body mass index is 41.25 kg/m.   Physical Exam: General exam: Middle-aged African-American male.  Not in physical distress Skin: No rashes, lesions or ulcers. HEENT: Atraumatic, normocephalic, no obvious bleeding Lungs: Clear to auscultation bilaterally CVS: Regular rate and rhythm, no murmur GI/Abd soft, nontender, nondistended, bowel sounds CNS: Alert, awake, oriented to place and person Psychiatry: Mood appropriate Extremities: No pedal edema, no calf tenderness  Data Review: I have personally reviewed the laboratory data and studies available.  F/u labs ordered Unresulted Labs (From admission,  onward)     Start     Ordered   03/24/22 0500  Basic metabolic panel  Tomorrow morning,   R       Question:  Specimen collection method  Answer:  Lab=Lab collect   03/23/22 1722            Signed, Lorin Glass, MD [facility] 03/25/2022

## 2022-03-26 NOTE — Progress Notes (Signed)
PROGRESS NOTE  [name]  DOB: Aug 08, 1966  PCP: Aviva Kluver JGG:[id number]  DOA: 09/24/2021  LOS: 178 days  Hospital Day: 184  Brief narrative: Jim Dunn is a 56 y.o. male with PMH significant for schizophrenia, chronic hyponatremia, seizure disorder, tobacco use, systolic congestive heart failure, COPD who was brought to ED on 09/24/2021 by his family after they were unable to take care of him.  He was drinking a lot of water and trying to burn things. Previously he was staying at group home and was kicked out.   Neurology and psychiatry were consulted.   Hospital course remarkable for acute on chronic hyponatremia, breakthrough seizures, COVID infection.  Prolonged hospitalization.   TOC following.  APS in process to determine if they will pursue guardianship.  Subjective: Patient was seen and examined this morning.  Not in distress.  No new symptoms.  No change in last 24 hours.  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 -Monitored in ICU for 24 hours -Mental status back to baseline now -Continue Depakote 500 mg twice daily, and Lyrica 75 mg 2 times daily.  Ativan as needed  Chronic hypervolemic hyponatremia Psychogenic polydipsia -Sodium trend as below.  Last sodium level 135 on last check on 7/18. -During this hospitalization, he has been tried on salt tablets, fluid restriction, Lasix.  But he does not follow the recommendation of fluid restriction. -Currently patient is not on Lasix or  salt tablets. -fluid restriction at 1500 mL/day. Recent Labs  Lab 03/19/22 1547 03/20/22 0500 03/21/22 0500  NA 134* 134* 135    Wandering behavior Underlying dementia with behavioral disturbance Paranoid schizophrenia with hallucination -Continue Invega 3 mg daily, Remeron 15 mg daily at bedtime, Seroquel 50 mg at bedtime, -As needed Haldol IM/IV  Chronic systolic CHF -Remains compensated.   -Echo 1/23 with EF 30-35% with moderately decreased systolic function and global hypokinesis. RV size and systolic function is normal. -Currently on Coreg 25 mg twice daily, digoxin 0.25 mg daily, Entresto 24/26 mg twice daily.  Lasix on hold at this time.  COVID 19 positive  -no clinical signs of viral pneumonia,  -he tested positive on 07/16 will need 10 days of isolation.  -Completed 5-day course of molnupiravir COPD -As needed DuoNeb  HLD -Continue Lipitor  Constipation -Bowel regimen: Senokot twice daily, MiraLAX as needed  Peripheral neuropathy -Lyrica  Goals of care   Code Status: Full Code   Nutritional status:  Body mass index is 41.25 kg/m.          Diet:  Diet Order             Diet regular Room service appropriate? No; Fluid consistency: Thin; Fluid restriction: 1200 mL Fluid  Diet effective 1000                   DVT prophylaxis:  Place and maintain sequential compression device Start: 11/01/21 1711   Antimicrobials: None Fluid: None Consultants: None Family Communication: None at bedside.   Status is: Inpatient  Continue in-hospital care because: Pending safe  discharge plan Level of care: Med-Surg   Dispo: The patient is from: Home              Anticipated d/c is to: Pending safe discharge plan.  APS involved              Patient currently is not medically stable to d/c.   Difficult to place patient No     Infusions:    Scheduled Meds:  ALPRAZolam  1 mg Oral BID   amLODipine  10 mg Oral Daily   atorvastatin  40 mg Oral Daily    carvedilol  25 mg Oral BID WC   digoxin  0.25 mg Oral Daily   divalproex  500 mg Oral BID   enoxaparin (LOVENOX) injection  60 mg Subcutaneous Q24H   fluticasone  2 spray Each Nare Daily   melatonin  3 mg Oral QHS   mirtazapine  15 mg Oral QHS   paliperidone  6 mg Oral Daily   polyethylene glycol  17 g Oral BID   pregabalin  75 mg Oral BID   QUEtiapine  50 mg Oral QHS   sacubitril-valsartan  1 tablet Oral BID   senna-docusate  2 tablet Oral BID    PRN meds: acetaminophen **OR** [DISCONTINUED] acetaminophen, ALPRAZolam, bisacodyl, nicotine polacrilex, mouth rinse   Antimicrobials: Anti-infectives (From admission, onward)    Start     Dose/Rate Route Frequency Ordered Stop   03/20/22 2200  molnupiravir EUA (LAGEVRIO) capsule 800 mg        4 capsule Oral 2 times daily 03/20/22 1518 03/25/22 0858       Objective: Vitals:   03/25/22 2300 03/26/22 0830  BP: (!) 150/86 (!) 156/110  Pulse: 74 73  Resp: 20 20  Temp: 98 F (36.7 C) (!) 97.5 F (36.4 C)  SpO2: 98% 94%    Intake/Output Summary (Last 24 hours) at 03/26/2022 1205 Last data filed at 03/25/2022 1805 Gross per 24 hour  Intake 660 ml  Output --  Net 660 ml    Filed Weights   01/10/22 0432 01/11/22 0500 02/27/22 1700  Weight: 115.6 kg 116.7 kg 123.1 kg   Weight change:  Body mass index is 41.25 kg/m.   Physical Exam: General exam: Middle-aged African-American male.  Not in physical distress Skin: No rashes, lesions or ulcers. HEENT: Atraumatic, normocephalic, no obvious bleeding Lungs: Clear to auscultation bilaterally CVS: Regular rate and rhythm, no murmur GI/Abd soft, nontender, nondistended, bowel sounds CNS: Alert, awake, oriented to place and person Psychiatry: Mood appropriate Extremities: No pedal edema, no calf tenderness  Data Review: I have personally reviewed the laboratory data and studies available.  F/u labs ordered Unresulted Labs (From admission, onward)     Start     Ordered    03/24/22 0500  Basic metabolic panel  Tomorrow morning,   R       Question:  Specimen collection method  Answer:  Lab=Lab collect   03/23/22 1722            Signed, Lorin Glass, MD [facility] 03/26/2022

## 2022-03-27 NOTE — Progress Notes (Signed)
PROGRESS NOTE  Jim Dunn  DOB: 1965-10-15  PCP: Aviva Kluver [id number]  DOA: 09/24/2021  LOS: 179 days  Hospital Day: 185  Brief narrative: Jim Dunn is a 56 y.o. male with PMH significant for schizophrenia, chronic hyponatremia, seizure disorder, tobacco use, systolic congestive heart failure, COPD who was brought to ED on 09/24/2021 by his family after they were unable to take care of him.  He was drinking a lot of water and trying to burn things. Previously he was staying at group home and was kicked out.   Neurology and psychiatry were consulted.   Hospital course remarkable for acute on chronic hyponatremia, breakthrough seizures, COVID infection.  Prolonged hospitalization.   TOC following.  APS in process to determine if they will pursue guardianship.  Subjective: Patient was seen and examined this morning.  Not in distress.  Standing at the door of his room.  He is aware that he cannot step out of the room while he is in isolation.  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 -Monitored in ICU for 24 hours -Mental status back to baseline now -Continue Depakote 500 mg twice daily, and Lyrica 75 mg 2 times daily.  Ativan as needed  Chronic hypervolemic hyponatremia Psychogenic polydipsia -Sodium trend as below.  Last sodium level 135 on last check on 7/18. -During this hospitalization, he has been tried on salt tablets, fluid restriction, Lasix.  But he does not follow the  recommendation of fluid restriction. -Currently patient is not on Lasix or salt tablets. -fluid restriction at 1500 mL/day. Recent Labs  Lab 03/21/22 0500  NA 135    Wandering behavior Underlying dementia with behavioral disturbance Paranoid schizophrenia with hallucination -Continue Invega 3 mg daily, Remeron 15 mg daily at bedtime, Seroquel 50 mg at bedtime, -As needed Haldol IM/IV  Chronic systolic CHF -Remains compensated.   -Echo 1/23 with EF 30-35% with moderately decreased systolic function and global hypokinesis. RV size and systolic function is normal. -Currently on Coreg 25 mg twice daily, digoxin 0.25 mg daily, Entresto 24/26 mg twice daily.  Lasix on hold at this time.  COVID 19 positive  -no clinical signs of viral pneumonia,  -he tested positive on 07/16 will need 10 days of isolation.  -Completed 5-day course of molnupiravir COPD -As needed DuoNeb  HLD -Continue Lipitor  Constipation -Bowel regimen: Senokot twice daily, MiraLAX as needed  Peripheral neuropathy -Lyrica  Goals of care   Code Status: Full Code   Nutritional status:  Body mass index is 41.25 kg/m.          Diet:  Diet Order             Diet regular Room service appropriate? No; Fluid consistency: Thin; Fluid restriction: 1200 mL Fluid  Diet effective 1000                   DVT prophylaxis:  Place and maintain sequential compression device Start: 11/01/21 1711   Antimicrobials: None Fluid: None Consultants: None Family Communication: None at bedside.   Status is:  Inpatient  Continue in-hospital care because: Pending safe discharge plan Level of care: Med-Surg   Dispo: The patient is from: Home              Anticipated d/c is to: Pending safe discharge plan.  APS involved              Patient currently is not medically stable to d/c.   Difficult to place patient No     Infusions:    Scheduled Meds:  ALPRAZolam  1 mg Oral BID   amLODipine  10 mg Oral Daily    atorvastatin  40 mg Oral Daily   carvedilol  25 mg Oral BID WC   digoxin  0.25 mg Oral Daily   divalproex  500 mg Oral BID   enoxaparin (LOVENOX) injection  60 mg Subcutaneous Q24H   fluticasone  2 spray Each Nare Daily   melatonin  3 mg Oral QHS   mirtazapine  15 mg Oral QHS   paliperidone  6 mg Oral Daily   polyethylene glycol  17 g Oral BID   pregabalin  75 mg Oral BID   QUEtiapine  50 mg Oral QHS   sacubitril-valsartan  1 tablet Oral BID   senna-docusate  2 tablet Oral BID    PRN meds: acetaminophen **OR** [DISCONTINUED] acetaminophen, ALPRAZolam, bisacodyl, nicotine polacrilex, mouth rinse   Antimicrobials: Anti-infectives (From admission, onward)    Start     Dose/Rate Route Frequency Ordered Stop   03/20/22 2200  molnupiravir EUA (LAGEVRIO) capsule 800 mg        4 capsule Oral 2 times daily 03/20/22 1518 03/25/22 0858       Objective: Vitals:   03/27/22 0830 03/27/22 1249  BP: (!) 131/106 115/83  Pulse: 70 74  Resp: 19 19  Temp: 98 F (36.7 C) 97.9 F (36.6 C)  SpO2: 99% 97%    Intake/Output Summary (Last 24 hours) at 03/27/2022 1307 Last data filed at 03/27/2022 1000 Gross per 24 hour  Intake 360 ml  Output --  Net 360 ml    Filed Weights   01/10/22 0432 01/11/22 0500 02/27/22 1700  Weight: 115.6 kg 116.7 kg 123.1 kg   Weight change:  Body mass index is 41.25 kg/m.   Physical Exam: General exam: Middle-aged African-American male.  Not in physical distress Skin: No rashes, lesions or ulcers. HEENT: Atraumatic, normocephalic, no obvious bleeding Lungs: Clear to auscultation bilaterally CVS: Regular rate and rhythm, no murmur GI/Abd soft, nontender, nondistended, bowel sounds CNS: Alert, awake, oriented to place and person Psychiatry: Mood appropriate Extremities: No pedal edema, no calf tenderness  Data Review: I have personally reviewed the laboratory data and studies available.  F/u labs ordered Unresulted Labs (From admission, onward)     None       Signed, Lorin Glass, MD [facility] 03/27/2022

## 2022-03-27 NOTE — Progress Notes (Signed)
CSW attempted to reach patient's legal guardian without success - a voicemail was left requesting a return call.  [name], MSW, LCSW Transitions of Care  Clinical Social Worker II [phone]

## 2022-03-28 NOTE — Progress Notes (Signed)
PROGRESS NOTE  Jim Dunn  DOB: 06/28/1966  PCP: Jim Dunn WUJ:[id number]  DOA: 09/24/2021  LOS: 180 days  Hospital Day: 186  Brief narrative: Jim Dunn is a 56 y.o. male with PMH significant for schizophrenia, chronic hyponatremia, seizure disorder, tobacco use, systolic congestive heart failure, COPD who was brought to ED on 09/24/2021 by his family after they were unable to take care of him.  He was drinking a lot of water and trying to burn things. Previously he was staying at group home and was kicked out.   Neurology and psychiatry were consulted.   Hospital course remarkable for acute on chronic hyponatremia, breakthrough seizures, COVID infection.  Prolonged hospitalization.   TOC following.  APS in process to determine if they will pursue guardianship.  Subjective: Patient was seen and examined this morning.  Not in distress.  No new symptoms   Assessment and Plan: Recurrent seizures due to hyponatremia -Last seizure episode on 2/26 related to sudden drop in sodium level. -Monitored in ICU for 24 hours -Mental status back to baseline now -Continue Depakote 500 mg twice daily, and Lyrica 75 mg 2 times daily.  Ativan as needed  Chronic hypervolemic hyponatremia Psychogenic polydipsia -Sodium trend as below.  Last sodium level 135 on last check on 7/18. -During this hospitalization, he has been tried on salt tablets, fluid restriction, Lasix.  But he does not follow the recommendation of fluid restriction. -Currently patient is not on Lasix or salt tablets. -fluid restriction at 1500 mL/day. No results for input(s): "NA" in the last 168 hours.  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   -Echo 1/23 with EF 30-35% with moderately decreased systolic  function and global hypokinesis. RV size and systolic function is normal. -Currently on Coreg 25 mg twice daily, digoxin 0.25 mg daily, Entresto 24/26 mg twice daily.  Lasix on hold at this time.  COVID 19 positive  -no clinical signs of viral pneumonia,  -he tested positive on 07/16 will need 10 days of isolation.  -Completed 5-day course of molnupiravir COPD -As needed DuoNeb  HLD -Continue Lipitor  Constipation -Bowel regimen: Senokot twice daily, MiraLAX as needed  Peripheral neuropathy -Lyrica  Goals of care   Code Status: Full Code   Nutritional status:  Body mass index is 41.25 kg/m.          Diet:  Diet Order             Diet regular Room service appropriate? No; Fluid consistency: Thin; Fluid restriction: 1200 mL Fluid  Diet effective 1000                   DVT prophylaxis:  Place and maintain sequential compression device Start: 11/01/21 1711   Antimicrobials: None Fluid: None Consultants: None Family Communication: None at bedside.   Status is: Inpatient  Continue in-hospital care because: Pending safe discharge plan Level of care: Med-Surg   Dispo: The patient is from: Home              Anticipated d/c is to: Pending safe discharge plan.  APS involved              Patient currently is not medically stable to d/c.   Difficult to place patient No     Infusions:    Scheduled Meds:  ALPRAZolam  1 mg Oral BID   amLODipine  10 mg Oral Daily   atorvastatin  40 mg Oral Daily   carvedilol  25 mg Oral BID WC   digoxin  0.25 mg Oral Daily   divalproex  500 mg Oral BID   enoxaparin (LOVENOX) injection  60 mg Subcutaneous Q24H   fluticasone  2 spray Each Nare Daily   melatonin  3 mg Oral QHS   mirtazapine  15 mg Oral QHS   paliperidone  6 mg Oral Daily   polyethylene glycol  17 g Oral BID   pregabalin  75 mg Oral BID   QUEtiapine  50 mg Oral QHS   sacubitril-valsartan  1 tablet Oral BID   senna-docusate  2 tablet Oral BID    PRN  meds: acetaminophen **OR** [DISCONTINUED] acetaminophen, ALPRAZolam, bisacodyl, nicotine polacrilex, mouth rinse   Antimicrobials: Anti-infectives (From admission, onward)    Start     Dose/Rate Route Frequency Ordered Stop   03/20/22 2200  molnupiravir EUA (LAGEVRIO) capsule 800 mg        4 capsule Oral 2 times daily 03/20/22 1518 03/25/22 0858       Objective: Vitals:   03/28/22 0004 03/28/22 0815  BP: 115/63 138/73  Pulse: 79 74  Resp: 15 16  Temp: 97.6 F (36.4 C) (!) 97.3 F (36.3 C)  SpO2: 96% 98%    Intake/Output Summary (Last 24 hours) at 03/28/2022 1328 Last data filed at 03/28/2022 0611 Gross per 24 hour  Intake 600 ml  Output --  Net 600 ml    Filed Weights   01/10/22 0432 01/11/22 0500 02/27/22 1700  Weight: 115.6 kg 116.7 kg 123.1 kg   Weight change:  Body mass index is 41.25 kg/m.   Physical Exam: General exam: Middle-aged African-American male.  Not in physical distress Skin: No rashes, lesions or ulcers. HEENT: Atraumatic, normocephalic, no obvious bleeding Lungs: Clear to auscultation bilaterally CVS: Regular rate and rhythm, no murmur GI/Abd soft, nontender, nondistended, bowel sounds CNS: Alert, awake, oriented to place and person Psychiatry: Mood appropriate Extremities: No pedal edema, no calf tenderness  Data Review: I have personally reviewed the laboratory data and studies available.  F/u labs ordered Unresulted Labs (From admission, onward)    None       Signed, Lorin Glass, MD [facility] 03/28/2022

## 2022-03-28 NOTE — Progress Notes (Signed)
CSW spoke with [name] of [name] to discuss the patient's discharge plan. [name] states she does not have any pending options for placement at this time. [name] states DSS leadership is still not in agreement to pay any amount towards the patient's placement.   [name], MSW, LCSW Transitions of Care  Clinical Social Worker II [phone]

## 2022-03-29 NOTE — Progress Notes (Signed)
PROGRESS NOTE  Jim Dunn  DOB: 01/29/1966  PCP: Aviva Kluver CHE:[id number]  DOA: 09/24/2021  LOS: 181 days  Hospital Day: 187  Brief narrative: Jim Dunn is a 56 y.o. male with PMH significant for schizophrenia, chronic hyponatremia, seizure disorder, tobacco use, systolic congestive heart failure, COPD who was brought to ED on 09/24/2021 by his family after they were unable to take care of him.  He was drinking a lot of water and trying to burn things. Previously he was staying at group home and was kicked out.   Neurology and psychiatry were consulted.   Hospital course remarkable for acute on chronic hyponatremia, breakthrough seizures, COVID infection.  Prolonged hospitalization.   TOC following.  APS in process to determine if they will pursue guardianship.  Subjective: Patient was seen and examined this morning.  Not in distress, no new symptoms.  Assessment and Plan: Recurrent seizures due to hyponatremia -Last seizure episode on 2/26 related to sudden drop in sodium level. -Monitored in ICU for 24 hours -Mental status back to baseline now -Continue Depakote 500 mg twice daily, and Lyrica 75 mg 2 times daily.  Ativan as needed  Chronic hypervolemic hyponatremia Psychogenic polydipsia -Sodium trend as below.  Last sodium level 135 on last check on 7/18. -During this hospitalization, he has been tried on salt tablets, fluid restriction, Lasix.  But he does not follow the recommendation of fluid restriction. -Currently patient is not on Lasix or salt tablets. -fluid restriction at 1500 mL/day. No results for input(s): "NA" in the last 168 hours.  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   -Echo 1/23 with EF 30-35% with moderately decreased systolic  function and global hypokinesis. RV size and systolic function is normal. -Currently on Coreg 25 mg twice daily, digoxin 0.25 mg daily, Entresto 24/26 mg twice daily.  Lasix on hold at this time.  COVID 19 positive  -no clinical signs of viral pneumonia,  -he tested positive on 07/16 will need 10 days of isolation.  -Completed 5-day course of molnupiravir -Isolation duration per infection control  COPD -As needed DuoNeb  HLD -Continue Lipitor  Constipation -Bowel regimen: Senokot twice daily, MiraLAX as needed  Peripheral neuropathy -Lyrica  Goals of care   Code Status: Full Code   Nutritional status:  Body mass index is 41.25 kg/m.          Diet:  Diet Order             Diet regular Room service appropriate? No; Fluid consistency: Thin; Fluid restriction: 1200 mL Fluid  Diet effective 1000                   DVT prophylaxis:  Place and maintain sequential compression device Start: 11/01/21 1711   Antimicrobials: None Fluid: None Consultants: None Family Communication: None at bedside.   Status is: Inpatient  Continue in-hospital care because: Pending safe discharge plan Level of care: Med-Surg   Dispo: The patient is from: Home              Anticipated d/c is to: Pending safe discharge plan.  APS involved              Patient currently is not medically stable to d/c.   Difficult to place patient No     Infusions:  Scheduled Meds:  ALPRAZolam  1 mg Oral BID   amLODipine  10 mg Oral Daily   atorvastatin  40 mg Oral Daily   carvedilol  25 mg Oral BID WC   digoxin  0.25 mg Oral Daily   divalproex  500 mg Oral BID   enoxaparin (LOVENOX) injection  60 mg Subcutaneous Q24H   fluticasone  2 spray Each Nare Daily   melatonin  3 mg Oral QHS   mirtazapine  15 mg Oral QHS   paliperidone  6 mg Oral Daily   polyethylene glycol  17 g Oral BID   pregabalin  75 mg Oral BID   QUEtiapine  50 mg Oral QHS   sacubitril-valsartan  1 tablet Oral BID    senna-docusate  2 tablet Oral BID    PRN meds: acetaminophen **OR** [DISCONTINUED] acetaminophen, ALPRAZolam, bisacodyl, nicotine polacrilex, mouth rinse   Antimicrobials: Anti-infectives (From admission, onward)    Start     Dose/Rate Route Frequency Ordered Stop   03/20/22 2200  molnupiravir EUA (LAGEVRIO) capsule 800 mg        4 capsule Oral 2 times daily 03/20/22 1518 03/25/22 0858       Objective: Vitals:   03/29/22 0417 03/29/22 0829  BP: 133/85 (!) 150/80  Pulse: 68 73  Resp: 20 19  Temp: 97.9 F (36.6 C) 97.8 F (36.6 C)  SpO2: 100% 97%    Intake/Output Summary (Last 24 hours) at 03/29/2022 1344 Last data filed at 03/29/2022 1200 Gross per 24 hour  Intake 850 ml  Output 1 ml  Net 849 ml    Filed Weights   01/10/22 0432 01/11/22 0500 02/27/22 1700  Weight: 115.6 kg 116.7 kg 123.1 kg   Weight change:  Body mass index is 41.25 kg/m.   Physical Exam: General exam: Middle-aged African-American male.  Not in physical distress Skin: No rashes, lesions or ulcers. HEENT: Atraumatic, normocephalic, no obvious bleeding Lungs: Clear to auscultation bilaterally CVS: Regular rate and rhythm, no murmur GI/Abd soft, nontender, nondistended, bowel sounds CNS: Alert, awake, oriented to place and person Psychiatry: Mood appropriate Extremities: No pedal edema, no calf tenderness  Data Review: I have personally reviewed the laboratory data and studies available.  F/u labs ordered Unresulted Labs (From admission, onward)    None       Signed, Lorin Glass, MD [facility] 03/29/2022

## 2022-03-29 NOTE — Progress Notes (Signed)
CSW spoke with patient at bedside to discuss the discharge planning efforts by CSW. Patient states understanding and all questions were answered. CSW to continue attempting to obtain placement for patient.  [name], MSW, LCSW Transitions of Care  Clinical Social Worker II [phone]

## 2022-03-30 NOTE — Progress Notes (Signed)
PROGRESS NOTE  Jim Dunn  DOB: 04-07-1966  PCP: Aviva Kluver IOE:[id number]  DOA: 09/24/2021  LOS: 182 days  Hospital Day: 188  Brief narrative: Jim Dunn is a 56 y.o. male with PMH significant for schizophrenia, chronic hyponatremia, seizure disorder, tobacco use, systolic congestive heart failure, COPD who was brought to ED on 09/24/2021 by his family after they were unable to take care of him.  He was drinking a lot of water and trying to burn things. Previously he was staying at group home and was kicked out.   Neurology and psychiatry were consulted.   Hospital course remarkable for acute on chronic hyponatremia, breakthrough seizures, COVID infection.  Prolonged hospitalization.   TOC following.  APS in process to determine if they will pursue guardianship.  Subjective: Patient was seen and examined this morning.  Not in distress, no new symptoms. Off COVID isolation  Assessment and Plan: Recurrent seizures due to hyponatremia -Last seizure episode on 2/26 related to sudden drop in sodium level. -Monitored in ICU for 24 hours -Mental status back to baseline now -Continue Depakote 500 mg twice daily, and Lyrica 75 mg 2 times daily.  Ativan as needed  Chronic hypervolemic hyponatremia Psychogenic polydipsia -Sodium trend as below.  Last sodium level 135 on last check on 7/18. -During this hospitalization, he has been tried on salt tablets, fluid restriction, Lasix.  But he does not follow the recommendation of fluid restriction. -Currently patient is not on Lasix or salt tablets. -fluid restriction at 1500 mL/day.  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   -Echo 1/23 with EF 30-35% with moderately decreased systolic function and global hypokinesis. RV  size and systolic function is normal. -Currently on Coreg 25 mg twice daily, digoxin 0.25 mg daily, Entresto 24/26 mg twice daily.  Lasix on hold at this time.  COVID 19 positive  -no clinical signs of viral pneumonia,  -he tested positive on 07/16 will need 10 days of isolation.  -Completed 5-day course of molnupiravir -Off isolation now  COPD -As needed DuoNeb  HLD -Continue Lipitor  Constipation -Bowel regimen: Senokot twice daily, MiraLAX as needed  Peripheral neuropathy -Lyrica  Goals of care   Code Status: Full Code   Nutritional status:  Body mass index is 41.25 kg/m.          Diet:  Diet Order             Diet regular Room service appropriate? No; Fluid consistency: Thin; Fluid restriction: 1200 mL Fluid  Diet effective 1000                   DVT prophylaxis:  Place and maintain sequential compression device Start: 11/01/21 1711   Antimicrobials: None Fluid: None Consultants: None Family Communication: None at bedside.   Status is: Inpatient  Continue in-hospital care because: Pending safe discharge plan Level of care: Med-Surg   Dispo: The patient is from: Home              Anticipated d/c is to: Pending safe discharge plan.  APS involved              Patient currently is not medically stable to d/c.   Difficult to place patient No     Infusions:    Scheduled Meds:  ALPRAZolam  1 mg Oral BID  amLODipine  10 mg Oral Daily   atorvastatin  40 mg Oral Daily   carvedilol  25 mg Oral BID WC   digoxin  0.25 mg Oral Daily   divalproex  500 mg Oral BID   enoxaparin (LOVENOX) injection  60 mg Subcutaneous Q24H   fluticasone  2 spray Each Nare Daily   melatonin  3 mg Oral QHS   mirtazapine  15 mg Oral QHS   paliperidone  6 mg Oral Daily   polyethylene glycol  17 g Oral BID   pregabalin  75 mg Oral BID   QUEtiapine  50 mg Oral QHS   sacubitril-valsartan  1 tablet Oral BID   senna-docusate  2 tablet Oral BID    PRN  meds: acetaminophen **OR** [DISCONTINUED] acetaminophen, ALPRAZolam, bisacodyl, nicotine polacrilex, mouth rinse   Antimicrobials: Anti-infectives (From admission, onward)    Start     Dose/Rate Route Frequency Ordered Stop   03/20/22 2200  molnupiravir EUA (LAGEVRIO) capsule 800 mg        4 capsule Oral 2 times daily 03/20/22 1518 03/25/22 0858       Objective: Vitals:   03/30/22 0825 03/30/22 0940  BP: (!) 104/53 (!) 149/100  Pulse: (!) 54 82  Resp: 18   Temp: 98.6 F (37 C)   SpO2:      Intake/Output Summary (Last 24 hours) at 03/30/2022 1430 Last data filed at 03/30/2022 1336 Gross per 24 hour  Intake 1420 ml  Output --  Net 1420 ml    Filed Weights   01/10/22 0432 01/11/22 0500 02/27/22 1700  Weight: 115.6 kg 116.7 kg 123.1 kg   Weight change:  Body mass index is 41.25 kg/m.   Physical Exam: General exam: Middle-aged African-American male.  Not in physical distress Skin: No rashes, lesions or ulcers. HEENT: Atraumatic, normocephalic, no obvious bleeding Lungs: Clear to auscultation bilaterally CVS: Regular rate and rhythm, no murmur GI/Abd soft, nontender, nondistended, bowel sounds CNS: Alert, awake, oriented to place and person Psychiatry: Mood appropriate Extremities: No pedal edema, no calf tenderness  Data Review: I have personally reviewed the laboratory data and studies available.  F/u labs ordered Unresulted Labs (From admission, onward)    None       Signed, Lorin Glass, MD [facility] 03/30/2022

## 2022-03-31 NOTE — Progress Notes (Signed)
PROGRESS NOTE  [name]  DOB: Jan 16, 1966  PCP: Aviva Kluver YTK:[id number]  DOA: 09/24/2021  LOS: 183 days  Hospital Day: 189  Brief narrative: Jim Dunn is a 56 y.o. male with PMH significant for schizophrenia, chronic hyponatremia, seizure disorder, tobacco use, systolic congestive heart failure, COPD who was brought to ED on 09/24/2021 by his family after they were unable to take care of him.  He was drinking a lot of water and trying to burn things. Previously he was staying at group home and was kicked out.   Neurology and psychiatry were consulted.   Hospital course remarkable for acute on chronic hyponatremia, breakthrough seizures, COVID infection.  Prolonged hospitalization.   TOC following.  APS in process to determine if they will pursue guardianship.  Subjective: Patient was seen and examined this morning.  Walking on the hallway.  Not in distress.  No new symptoms.  Happy to be off COVID isolation  Assessment and Plan: Recurrent seizures due to hyponatremia -Last seizure episode on 2/26 related to sudden drop in sodium level. -Monitored in ICU for 24 hours -Mental status back to baseline now -Continue Depakote 500 mg twice daily, and Lyrica 75 mg 2 times daily.  Ativan as needed  Chronic hypervolemic hyponatremia Psychogenic polydipsia -Sodium trend as below.  Last sodium level 135 on last check on 7/18. -During this hospitalization, he has been tried on salt tablets, fluid restriction, Lasix.  But he does not follow the recommendation of fluid restriction. -Currently patient is not on Lasix or salt tablets. -fluid restriction at 1500 mL/day.  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   -Echo 1/23 with EF 30-35% with moderately decreased  systolic function and global hypokinesis. RV size and systolic function is normal. -Currently on Coreg 25 mg twice daily, digoxin 0.25 mg daily, Entresto 24/26 mg twice daily.  Lasix on hold at this time.  COVID 19 positive  -no clinical signs of viral pneumonia,  -he tested positive on 07/16 will need 10 days of isolation.  -Completed 5-day course of molnupiravir -Off isolation now  COPD -As needed DuoNeb  HLD -Continue Lipitor  Constipation -Bowel regimen: Senokot twice daily, MiraLAX as needed  Peripheral neuropathy -Lyrica  Goals of care   Code Status: Full Code   Nutritional status:  Body mass index is 41.25 kg/m.          Diet:  Diet Order             Diet regular Room service appropriate? No; Fluid consistency: Thin; Fluid restriction: 1200 mL Fluid  Diet effective 1000                   DVT prophylaxis:  Place and maintain sequential compression device Start: 11/01/21 1711   Antimicrobials: None Fluid: None Consultants: None Family Communication: None at bedside.   Status is: Inpatient  Continue in-hospital care because: Pending safe discharge plan Level of care: Med-Surg   Dispo: The patient is from: Home              Anticipated d/c is to: Pending safe discharge plan.  APS involved              Patient currently is not medically stable to d/c.   Difficult to place patient No     Infusions:  Scheduled Meds:  ALPRAZolam  1 mg Oral BID   amLODipine  10 mg Oral Daily   atorvastatin  40 mg Oral Daily   carvedilol  25 mg Oral BID WC   digoxin  0.25 mg Oral Daily   divalproex  500 mg Oral BID   enoxaparin (LOVENOX) injection  60 mg Subcutaneous Q24H   fluticasone  2 spray Each Nare Daily   melatonin  3 mg Oral QHS   mirtazapine  15 mg Oral QHS   paliperidone  6 mg Oral Daily   polyethylene glycol  17 g Oral BID   pregabalin  75 mg Oral BID   QUEtiapine  50 mg Oral QHS   sacubitril-valsartan  1 tablet Oral BID   senna-docusate   2 tablet Oral BID    PRN meds: acetaminophen **OR** [DISCONTINUED] acetaminophen, ALPRAZolam, bisacodyl, nicotine polacrilex, mouth rinse   Antimicrobials: Anti-infectives (From admission, onward)    Start     Dose/Rate Route Frequency Ordered Stop   03/20/22 2200  molnupiravir EUA (LAGEVRIO) capsule 800 mg        4 capsule Oral 2 times daily 03/20/22 1518 03/25/22 0858       Objective: Vitals:   03/31/22 0814 03/31/22 1132  BP: 133/83 130/78  Pulse: 73 76  Resp: 20 20  Temp: 98 F (36.7 C) 97.6 F (36.4 C)  SpO2: 100% 97%    Intake/Output Summary (Last 24 hours) at 03/31/2022 1324 Last data filed at 03/30/2022 2100 Gross per 24 hour  Intake 680 ml  Output --  Net 680 ml    Filed Weights   01/10/22 0432 01/11/22 0500 02/27/22 1700  Weight: 115.6 kg 116.7 kg 123.1 kg   Weight change:  Body mass index is 41.25 kg/m.   Physical Exam: General exam: Middle-aged African-American male.  Not in physical distress Skin: No rashes, lesions or ulcers. HEENT: Atraumatic, normocephalic, no obvious bleeding Lungs: Clear to auscultation bilaterally CVS: Regular rate and rhythm, no murmur GI/Abd soft, nontender, nondistended, bowel sounds CNS: Alert, awake, oriented to place and person Psychiatry: Mood appropriate Extremities: No pedal edema, no calf tenderness  Data Review: I have personally reviewed the laboratory data and studies available.  F/u labs ordered Unresulted Labs (From admission, onward)    None       Signed, Lorin Glass, MD [facility] 03/31/2022

## 2022-04-01 NOTE — Progress Notes (Signed)
PROGRESS NOTE  [name]  DOB: Aug 07, 1966  PCP: Aviva Kluver ZHG:[id number]  DOA: 09/24/2021  LOS: 184 days  Hospital Day: 190  Brief narrative: Jim Dunn is a 56 y.o. male with PMH significant for schizophrenia, chronic hyponatremia, seizure disorder, tobacco use, systolic congestive heart failure, COPD who was brought to ED on 09/24/2021 by his family after they were unable to take care of him.  He was drinking a lot of water and trying to burn things. Previously he was staying at group home and was kicked out.   Neurology and psychiatry were consulted.   Hospital course remarkable for acute on chronic hyponatremia, breakthrough seizures, COVID infection.  Prolonged hospitalization.   TOC following.  APS in process to determine if they will pursue guardianship.  Subjective: Patient was seen and examined this morning.  Not in distress.  No new symptoms.  No change in plan in last several days  Assessment and Plan: Recurrent seizures due to hyponatremia -Last seizure episode on 2/26 related to sudden drop in sodium level. -Monitored in ICU for 24 hours -Mental status back to baseline now -Continue Depakote 500 mg twice daily, and Lyrica 75 mg 2 times daily.  Ativan as needed  Chronic hypervolemic hyponatremia Psychogenic polydipsia -Sodium trend as below.  Last sodium level 135 on last check on 7/18. -During this hospitalization, he has been tried on salt tablets, fluid restriction, Lasix.  But he does not follow the recommendation of fluid restriction. -Currently patient is not on Lasix or salt tablets. -fluid restriction at 1500 mL/day.  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   -Echo 1/23 with EF 30-35% with moderately decreased systolic function and  global hypokinesis. RV size and systolic function is normal. -Currently on Coreg 25 mg twice daily, digoxin 0.25 mg daily, Entresto 24/26 mg twice daily.  Lasix on hold at this time.  COVID 19 positive  -no clinical signs of viral pneumonia,  -he tested positive on 07/16 will need 10 days of isolation.  -Completed 5-day course of molnupiravir -Off isolation now  COPD -As needed DuoNeb  HLD -Continue Lipitor  Constipation -Bowel regimen: Senokot twice daily, MiraLAX as needed  Peripheral neuropathy -Lyrica  Goals of care   Code Status: Full Code   Nutritional status:  Body mass index is 41.25 kg/m.          Diet:  Diet Order             Diet regular Room service appropriate? No; Fluid consistency: Thin; Fluid restriction: 1200 mL Fluid  Diet effective 1000                   DVT prophylaxis:  Place and maintain sequential compression device Start: 11/01/21 1711   Antimicrobials: None Fluid: None Consultants: None Family Communication: None at bedside.   Status is: Inpatient  Continue in-hospital care because: Pending safe discharge plan Level of care: Med-Surg   Dispo: The patient is from: Home              Anticipated d/c is to: Pending safe discharge plan.  APS involved              Patient currently is not medically stable to d/c.   Difficult to place patient No     Infusions:    Scheduled Meds:  ALPRAZolam  1 mg Oral BID   amLODipine  10 mg Oral Daily   atorvastatin  40 mg Oral Daily   carvedilol  25 mg Oral BID WC   digoxin  0.25 mg Oral Daily   divalproex  500 mg Oral BID   enoxaparin (LOVENOX) injection  60 mg Subcutaneous Q24H   fluticasone  2 spray Each Nare Daily   melatonin  3 mg Oral QHS   mirtazapine  15 mg Oral QHS   paliperidone  6 mg Oral Daily   polyethylene glycol  17 g Oral BID   pregabalin  75 mg Oral BID   QUEtiapine  50 mg Oral QHS   sacubitril-valsartan  1 tablet Oral BID   senna-docusate  2 tablet Oral BID     PRN meds: acetaminophen **OR** [DISCONTINUED] acetaminophen, ALPRAZolam, bisacodyl, nicotine polacrilex, mouth rinse   Antimicrobials: Anti-infectives (From admission, onward)    Start     Dose/Rate Route Frequency Ordered Stop   03/20/22 2200  molnupiravir EUA (LAGEVRIO) capsule 800 mg        4 capsule Oral 2 times daily 03/20/22 1518 03/25/22 0858       Objective: Vitals:   04/01/22 0350 04/01/22 0819  BP: 111/74 109/81  Pulse: (!) 59 68  Resp: 16 18  Temp: 98.7 F (37.1 C) 98 F (36.7 C)  SpO2: 96% 97%    Intake/Output Summary (Last 24 hours) at 04/01/2022 1353 Last data filed at 03/31/2022 1900 Gross per 24 hour  Intake 360 ml  Output --  Net 360 ml    Filed Weights   01/10/22 0432 01/11/22 0500 02/27/22 1700  Weight: 115.6 kg 116.7 kg 123.1 kg   Weight change:  Body mass index is 41.25 kg/m.   Physical Exam: General exam: Middle-aged African-American male.  Not in physical distress Skin: No rashes, lesions or ulcers. HEENT: Atraumatic, normocephalic, no obvious bleeding Lungs: Clear to auscultation bilaterally CVS: Regular rate and rhythm, no murmur GI/Abd soft, nontender, nondistended, bowel sounds CNS: Alert, awake, oriented to place and person Psychiatry: Mood appropriate Extremities: No pedal edema, no calf tenderness  Data Review: I have personally reviewed the laboratory data and studies available.  F/u labs ordered Unresulted Labs (From admission, onward)    None       Signed, Lorin Glass, MD [facility] 04/01/2022

## 2022-04-02 NOTE — Progress Notes (Signed)
PROGRESS NOTE  Jim Dunn  DOB: 1965/12/13  PCP: Jim Dunn [id number]  DOA: 09/24/2021  LOS: 185 days  Hospital Day: 191  Brief narrative: Jim Dunn is a 56 y.o. male with PMH significant for schizophrenia, chronic hyponatremia, seizure disorder, tobacco use, systolic congestive heart failure, COPD who was brought to ED on 09/24/2021 by his family after they were unable to take care of him.  He was drinking a lot of water and trying to burn things. Previously he was staying at group home and was kicked out.   Neurology and psychiatry were consulted.   Hospital course remarkable for acute on chronic hyponatremia, breakthrough seizures, COVID infection.  Prolonged hospitalization.   TOC following.  APS in process to determine if they will pursue guardianship.  Subjective: Patient was seen and examined this morning.  Not in distress.  No new symptoms.  No change in plan last several days.  Assessment and Plan: Recurrent seizures due to hyponatremia -Last seizure episode on 2/26 related to sudden drop in sodium level. -Monitored in ICU for 24 hours -Mental status back to baseline now -Continue Depakote 500 mg twice daily, and Lyrica 75 mg 2 times daily.  Ativan as needed  Chronic hypervolemic hyponatremia Psychogenic polydipsia -Sodium trend as below.  Last sodium level 135 on last check on 7/18. -During this hospitalization, he has been tried on salt tablets, fluid restriction, Lasix.  But he does not follow the recommendation of fluid restriction. -Currently patient is not on Lasix or salt tablets. -fluid restriction at 1500 mL/day.  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   -Echo 1/23 with EF 30-35% with moderately decreased systolic function and  global hypokinesis. RV size and systolic function is normal. -Currently on Coreg 25 mg twice daily, digoxin 0.25 mg daily, Entresto 24/26 mg twice daily.  Lasix on hold at this time.  COVID 19 positive  -no clinical signs of viral pneumonia,  -he tested positive on 07/16 will need 10 days of isolation.  -Completed 5-day course of molnupiravir -Off isolation now  COPD -As needed DuoNeb  HLD -Continue Lipitor  Constipation -Bowel regimen: Senokot twice daily, MiraLAX as needed  Peripheral neuropathy -Lyrica  Goals of care   Code Status: Full Code   Nutritional status:  Body mass index is 41.25 kg/m.          Diet:  Diet Order             Diet regular Room service appropriate? No; Fluid consistency: Thin; Fluid restriction: 1200 mL Fluid  Diet effective 1000                   DVT prophylaxis:  Place and maintain sequential compression device Start: 11/01/21 1711   Antimicrobials: None Fluid: None Consultants: None Family Communication: None at bedside.   Status is: Inpatient  Continue in-hospital care because: Pending safe discharge plan Level of care: Med-Surg   Dispo: The patient is from: Home              Anticipated d/c is to: Pending safe discharge plan.  APS involved              Patient currently is not medically stable to d/c.   Difficult to place patient No     Infusions:    Scheduled Meds:  ALPRAZolam  1 mg Oral BID   amLODipine  10 mg Oral Daily   atorvastatin  40 mg Oral Daily   carvedilol  25 mg Oral BID WC   digoxin  0.25 mg Oral Daily   divalproex  500 mg Oral BID   enoxaparin (LOVENOX) injection  60 mg Subcutaneous Q24H   fluticasone  2 spray Each Nare Daily   melatonin  3 mg Oral QHS   mirtazapine  15 mg Oral QHS   paliperidone  6 mg Oral Daily   polyethylene glycol  17 g Oral BID   pregabalin  75 mg Oral BID   QUEtiapine  50 mg Oral QHS   sacubitril-valsartan  1 tablet Oral BID   senna-docusate  2 tablet Oral BID     PRN meds: acetaminophen **OR** [DISCONTINUED] acetaminophen, ALPRAZolam, bisacodyl, nicotine polacrilex, mouth rinse   Antimicrobials: Anti-infectives (From admission, onward)    Start     Dose/Rate Route Frequency Ordered Stop   03/20/22 2200  molnupiravir EUA (LAGEVRIO) capsule 800 mg        4 capsule Oral 2 times daily 03/20/22 1518 03/25/22 0858       Objective: Vitals:   04/02/22 0847 04/02/22 1440  BP: 103/79 118/76  Pulse: 70 74  Resp: 20 18  Temp: 98.5 F (36.9 C) 98.7 F (37.1 C)  SpO2: 97% 97%    Intake/Output Summary (Last 24 hours) at 04/02/2022 1446 Last data filed at 04/01/2022 1600 Gross per 24 hour  Intake 240 ml  Output --  Net 240 ml    Filed Weights   01/10/22 0432 01/11/22 0500 02/27/22 1700  Weight: 115.6 kg 116.7 kg 123.1 kg   Weight change:  Body mass index is 41.25 kg/m.   Physical Exam: General exam: Middle-aged African-American male.  Not in physical distress Skin: No rashes, lesions or ulcers. HEENT: Atraumatic, normocephalic, no obvious bleeding Lungs: Clear to auscultation bilaterally CVS: Regular rate and rhythm, no murmur GI/Abd soft, nontender, nondistended, bowel sounds CNS: Alert, awake, oriented to place and person Psychiatry: Mood appropriate Extremities: No pedal edema, no calf tenderness  Data Review: I have personally reviewed the laboratory data and studies available.  F/u labs ordered Unresulted Labs (From admission, onward)    None       Signed, Lorin Glass, MD [facility] 04/02/2022

## 2022-04-03 DIAGNOSIS — J431 Panlobular emphysema: Secondary | ICD-10-CM | POA: Diagnosis not present

## 2022-04-03 DIAGNOSIS — E871 Hypo-osmolality and hyponatremia: Secondary | ICD-10-CM | POA: Diagnosis not present

## 2022-04-03 DIAGNOSIS — F0390 Unspecified dementia without behavioral disturbance: Secondary | ICD-10-CM | POA: Diagnosis not present

## 2022-04-03 DIAGNOSIS — E782 Mixed hyperlipidemia: Secondary | ICD-10-CM | POA: Diagnosis not present

## 2022-04-03 NOTE — Progress Notes (Signed)
CSW spoke with legal guardian Cottie Banda of [name] via phone to discuss a legal matter involving the patient. [name] to speak with the appropriate parties and will return call to CSW.  [name], MSW, LCSW Transitions of Care  Clinical Social Worker II [phone]

## 2022-04-03 NOTE — Progress Notes (Signed)
PROGRESS NOTE  [name]  DOB: 1966/02/10  PCP: Aviva Kluver [id number]  DOA: 09/24/2021  LOS: 186 days  Hospital Day: 192  Brief narrative: Jim Dunn is a 56 y.o. male with PMH significant for schizophrenia, chronic hyponatremia, seizure disorder, tobacco use, systolic congestive heart failure, COPD who was brought to ED on 09/24/2021 by his family after they were unable to take care of him.  He was drinking a lot of water and trying to burn things. Previously he was staying at group home and was kicked out.   Neurology and psychiatry were consulted.   Hospital course remarkable for acute on chronic hyponatremia, breakthrough seizures, COVID infection.  Prolonged hospitalization.   TOC following.  Barriers to Discharge:  APS in process to determine if they will pursue guardianship. Placement.  Subjective: Patient has no specific complaints but says would like something stronger for neuropathy in feet.    Assessment and Plan: Recurrent seizures due to hyponatremia -Last seizure episode on 2/26 related to sudden drop in sodium level. -Monitored in ICU for 24 hours -Mental status back to baseline now -Continue Depakote 500 mg twice daily, and Lyrica 75 mg 2 times daily.  Ativan as needed  Chronic hypervolemic hyponatremia Psychogenic polydipsia -Sodium trend as below.  Last sodium level 135 on last check on 7/18. -During this hospitalization, he has been tried on salt tablets, fluid restriction, Lasix.  But he does not follow the recommendation of fluid restriction. -Currently patient is not on Lasix or salt tablets. -fluid restriction at 1500 mL/day has been effective if he follows it.   Wandering behavior Underlying dementia with behavioral disturbance Paranoid schizophrenia with hallucination -Continue Invega 3 mg daily, Remeron 15 mg daily at bedtime, Seroquel 50 mg at bedtime, -As needed Haldol IM/IV.  Pt not allowed to sign out AMA.   Chronic systolic CHF -Remains  compensated.   -Echo 1/23 with EF 30-35% with moderately decreased systolic function and global hypokinesis. RV size and systolic function is normal. -Currently on Coreg 25 mg twice daily, digoxin 0.25 mg daily, Entresto 24/26 mg twice daily.  Lasix on hold at this time.  COVID 19 positive - treated  -no clinical signs of viral pneumonia,  -he tested positive on 07/16 and completed 10 days of isolation.  -Completed 5-day course of molnupiravir -Off isolation now  COPD -As needed DuoNeb  HLD -Continue Lipitor  Constipation -Bowel regimen: Senokot twice daily, MiraLAX as needed  Peripheral neuropathy -Lyrica  Goals of care   Code Status: Full Code   Nutritional status:  Body mass index is 41.25 kg/m.          Diet:  Diet Order             Diet regular Room service appropriate? Yes; Fluid consistency: Thin; Fluid restriction: 1200 mL Fluid  Diet effective 1400                   DVT prophylaxis:  Place and maintain sequential compression device Start: 11/01/21 1711   Antimicrobials: None Fluid: None Consultants: None Family Communication: None at bedside.   Status is: Inpatient  Continue in-hospital care because: Pending safe discharge plan Level of care: Med-Surg   Dispo: The patient is from: Home              Anticipated d/c is to: Pending safe discharge plan.  APS involved              Patient currently is not medically stable to d/c.  Difficult to place patient No  Infusions:    Scheduled Meds:  ALPRAZolam  1 mg Oral BID   amLODipine  10 mg Oral Daily   atorvastatin  40 mg Oral Daily   carvedilol  25 mg Oral BID WC   digoxin  0.25 mg Oral Daily   divalproex  500 mg Oral BID   enoxaparin (LOVENOX) injection  60 mg Subcutaneous Q24H   fluticasone  2 spray Each Nare Daily   melatonin  3 mg Oral QHS   mirtazapine  15 mg Oral QHS   paliperidone  6 mg Oral Daily   polyethylene glycol  17 g Oral BID   pregabalin  75 mg Oral BID   QUEtiapine   50 mg Oral QHS   sacubitril-valsartan  1 tablet Oral BID   senna-docusate  2 tablet Oral BID    PRN meds: acetaminophen **OR** [DISCONTINUED] acetaminophen, ALPRAZolam, bisacodyl, nicotine polacrilex, mouth rinse   Antimicrobials: Anti-infectives (From admission, onward)    Start     Dose/Rate Route Frequency Ordered Stop   03/20/22 2200  molnupiravir EUA (LAGEVRIO) capsule 800 mg        4 capsule Oral 2 times daily 03/20/22 1518 03/25/22 0858       Objective: Vitals:   04/03/22 0430 04/03/22 0737  BP: (!) 103/58 (!) 143/88  Pulse: 71 69  Resp: 18   Temp: 97.8 F (36.6 C) 97.9 F (36.6 C)  SpO2: 97% 100%    Intake/Output Summary (Last 24 hours) at 04/03/2022 1001 Last data filed at 04/03/2022 0841 Gross per 24 hour  Intake 840 ml  Output --  Net 840 ml   Filed Weights   01/10/22 0432 01/11/22 0500 02/27/22 1700  Weight: 115.6 kg 116.7 kg 123.1 kg   Weight change:  Body mass index is 41.25 kg/m.   Physical Exam: General exam: awake, alert, calm, cooperative, NAD.  Skin: No rashes, lesions or ulcers. HEENT: Atraumatic, normocephalic.  Neck supple, no thyromegaly. Lungs: BBS clear to auscultation.   CVS: normal s1, s2 sounds, no MRG GI/Abd obese, soft, nontender, nondistended, normal bowel sounds CNS: Alert, awake, oriented to place and person Psychiatry: Mood appropriate Extremities: No pedal edema, no calf tenderness  Data Review: I have personally reviewed the laboratory data and studies available.  F/u labs ordered Unresulted Labs (From admission, onward)    None       Signed, Standley Dakins, MD [facility] 04/03/2022 How to contact the [facility] Attending or Consulting provider 7A - 7P or covering provider during after hours 7P -7A, for this patient?  Check the care team in [facility] and look for a) attending/consulting [facility] provider listed and b) the [facility] team listed Log into [email or web address] and use Camilla's universal password to access. If you do  not have the password, please contact the hospital operator. Locate the [facility] provider you are looking for under [facility] and page to a number that you can be directly reached. If you still have difficulty reaching the provider, please page the [facility] (Director on Call) for the Hospitalists listed on amion for assistance.

## 2022-04-04 DIAGNOSIS — E871 Hypo-osmolality and hyponatremia: Secondary | ICD-10-CM | POA: Diagnosis not present

## 2022-04-04 DIAGNOSIS — F0390 Unspecified dementia without behavioral disturbance: Secondary | ICD-10-CM | POA: Diagnosis not present

## 2022-04-04 DIAGNOSIS — I5022 Chronic systolic (congestive) heart failure: Secondary | ICD-10-CM | POA: Diagnosis not present

## 2022-04-04 DIAGNOSIS — J431 Panlobular emphysema: Secondary | ICD-10-CM | POA: Diagnosis not present

## 2022-04-04 LAB — BASIC METABOLIC PANEL
Anion gap: 7 (ref 5–15)
BUN: 8 mg/dL (ref 6–20)
CO2: 25 mmol/L (ref 22–32)
Calcium: 8.9 mg/dL (ref 8.9–10.3)
Chloride: 100 mmol/L (ref 98–111)
Creatinine, Ser: 0.73 mg/dL (ref 0.61–1.24)
GFR, Estimated: >60 mL/min (ref >60)
Glucose, Bld: 91 mg/dL (ref 70–99)
Potassium: 4.5 mmol/L (ref 3.5–5.1)
Sodium: 132 mmol/L — ABNORMAL LOW (ref 135–145)

## 2022-04-04 MED ORDER — SODIUM CHLORIDE 1 G PO TABS
1.0000 g | ORAL_TABLET | Freq: Three times a day (TID) | ORAL | Status: DC
  Administered 2022-04-05 – 2022-04-07 (×7): 1 g via ORAL
  Filled 2022-04-04 (×7): qty 1

## 2022-04-04 NOTE — Progress Notes (Addendum)
1:40pm: CSW spoke with legal guardian who states the patient has been accepted at [facility].  CSW spoke with Terri Skains of [facility] to confirm his acceptance of the patient. Jorja Loa is able to accept the patient into the home once the new bed he has ordered is delivered. CSW sent Tim the patient's FL2 and current medication list.  12pm: CSW spoke with [facility] Director to have placement concerns escalated to DSS administration to request their assistance.  CSW provided legal guardian with a list of agencies and group home that the patient has been denied at.  10:30am: CSW spoke with patient at bedside to discuss the legal matter that was brought to his attention yesterday. CSW will continue attempting to locate placement for patient.  [name], MSW, LCSW Transitions of Care  Clinical Social Worker II [phone]

## 2022-04-04 NOTE — Progress Notes (Signed)
  Progress Note  Patient: Jim Dunn [id number]:[id number] DOB: 05/09/1966  DOA: 09/24/2021  DOS: 04/04/2022    Brief hospital course: Rito Lecomte is a 56 y.o. male with past medical history significant for paranoid schizophrenia, psychogenic polydipsia, HTN, chronic systolic CHF, history of seizures, COPD, alcohol/drug abuse, tobacco abuse who presented to [facility] ED on 1/21 via EMS for altered behavior per family.  He was drinking a lot of water and trying to burn things. Previously he was staying at group home and was kicked out. Neurology and psychiatry were consulted.   Hospital course remarkable for acute on chronic hyponatremia, breakthrough seizures, COVID infection. All medical issues are now quiescent and he has remained inpatient while attempting to arrange adequate placement.    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 Will restart these and monitor. - Sodium level has durably improved and stabilized.   Major neurocognitive disorder PTSD Paranoid schizophrenia with hallucinations Dementia without behavioral disturbance Patient lacks decision-making capacity. --Seroquel 25 mg p.o. nightly --Depakote ER 500mg  PO BID --Invega 6mg  PO daily --Xanax 1 mg p.o. twice daily --Mirtazapine 15 mg p.o. nightly --Social work for placement   Seizure disorder --Depakote ER 500 g p.o. twice daily   Chronic systolic congestive heart failure, compensated TTE 09/26/2021 with LVEF 30-35%, LV moderately decreased function, LV with global hypokinesis, LV mildly dilated, LV diastolic parameters normal, trivial MR, no aortic stenosis, IVC normal in size. --Digoxin 0.25 mg p.o. daily --Entresto 24-26mg  po BID   --Carvedilol 25 mg p.o. twice daily   Essential hypertension --Amlodipine 10 mg p.o. daily --Carvedilol --Entresto 49-51 mg p.o. twice  daily   Hyperlipidemia --Atorvastatin 40 mg p.o. daily   Peripheral neuropathy --Continue lyrica, having titrated dose upward with improved symptoms. Ceiling dose would be 100mg  TID.   Tobacco use disorder --Nicotine gum as needed   Obesity Body mass index is 41.25 kg/m. Outpatient follow-up with PCP.    Covid-19 infection: Resolved. Treated, no longer requiring isolation.  Subjective: No new complaints, numbness in feet is improved somewhat. No lingering effects from covid.  Objective: Vitals:   04/04/22 0021 04/04/22 0411 04/04/22 0743 04/04/22 1538  BP: 109/70 128/78 121/70 125/73  Pulse: 79 70 62 85  Resp: 17 18 18 18   Temp: 97.8 F (36.6 C) 97.7 F (36.5 C) 98.3 F (36.8 C) 97.9 F (36.6 C)  TempSrc:  Oral Oral Oral  SpO2: 96% 97% 99% 98%  Weight:      Height:      Well-appearing obese male in no distress Clear, nonlabored RRR   Family Communication: None at bedside  Disposition: Status is: Inpatient Remains inpatient appropriate because: Unsafe disposition. CSW working diligently on this difficult-to-place patient. Planned Discharge Destination:  Group home   06/04/22, MD 04/04/2022 5:13 PM Page by [email or web address]

## 2022-04-05 DIAGNOSIS — I5022 Chronic systolic (congestive) heart failure: Secondary | ICD-10-CM | POA: Diagnosis not present

## 2022-04-05 DIAGNOSIS — F0390 Unspecified dementia without behavioral disturbance: Secondary | ICD-10-CM | POA: Diagnosis not present

## 2022-04-05 DIAGNOSIS — J431 Panlobular emphysema: Secondary | ICD-10-CM | POA: Diagnosis not present

## 2022-04-05 DIAGNOSIS — E871 Hypo-osmolality and hyponatremia: Secondary | ICD-10-CM | POA: Diagnosis not present

## 2022-04-05 NOTE — Progress Notes (Signed)
  Progress Note  Patient: Jim Dunn [id number] DOB: 1966-06-19  DOA: 09/24/2021  DOS: 04/05/2022    Brief hospital course: Jim Dunn is a 56 y.o. male with past medical history significant for paranoid schizophrenia, psychogenic polydipsia, HTN, chronic systolic CHF, history of seizures, COPD, alcohol/drug abuse, tobacco abuse who presented to [facility] ED on 1/21 via EMS for altered behavior per family.  He was drinking a lot of water and trying to burn things. Previously he was staying at group home and was kicked out. Neurology and psychiatry were consulted.   Hospital course remarkable for acute on chronic hyponatremia, breakthrough seizures, COVID infection. All medical issues are now quiescent and he has remained inpatient while attempting to arrange adequate placement.    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 Will recheck tomorrow after restarting sodium tabs today.   Major neurocognitive disorder PTSD Paranoid schizophrenia with hallucinations Dementia without behavioral disturbance Patient lacks decision-making capacity. --Seroquel 25 mg p.o. nightly --Depakote ER 500mg  PO BID --Invega 6mg  PO daily --Xanax 1 mg p.o. twice daily --Mirtazapine 15 mg p.o. nightly --[name] will transport the patient to group home 8/4 at 10am.   Seizure disorder --Depakote ER 500 g p.o. twice daily   Chronic systolic congestive heart failure, compensated TTE 09/26/2021 with LVEF 30-35%, LV moderately decreased function, LV with global hypokinesis, LV mildly dilated, LV diastolic parameters normal, trivial MR, no aortic stenosis, IVC normal in size. --Digoxin 0.25 mg p.o. daily --Entresto 49-51mg  po BID   --Carvedilol 25 mg p.o. twice daily   Essential hypertension --Amlodipine 10 mg p.o. daily --Carvedilol --Entresto 49-51 mg p.o. twice daily    Hyperlipidemia --Atorvastatin 40 mg p.o. daily   Peripheral neuropathy --Continue lyrica, having titrated dose upward with improved symptoms. Ceiling dose would be 100mg  TID.   Tobacco use disorder --Nicotine gum as needed   Obesity: Body mass index is 41.25 kg/m.  - Outpatient follow-up with PCP.    Covid-19 infection: Resolved. Treated, no longer requiring isolation.  Subjective: No new complaints. Ready to leave.  Objective: Vitals:   04/04/22 2002 04/04/22 2327 04/05/22 0358 04/05/22 0824  BP: 136/75 96/64 107/66 (!) 152/88  Pulse: 77 75 63 70  Resp: 20 18 16 18   Temp: 98.3 F (36.8 C) 99 F (37.2 C) 97.7 F (36.5 C) 97.7 F (36.5 C)  TempSrc: Oral Oral Oral Oral  SpO2: 98% 93% 98% 99%  Weight:      Height:      Well-appearing male in no distress Clear, nonlabored No pitting edema.   Family Communication: None at bedside  Disposition: Status is: Inpatient Remains inpatient appropriate because: Unsafe disposition. CSW working diligently on this difficult-to-place patient. Planned Discharge Destination:  Group home   06/04/22, MD 04/05/2022 2:48 PM Page by [email or web address]

## 2022-04-05 NOTE — Progress Notes (Signed)
CSW was informed by [name] at [facility] who states she will transport the patient to the group home on Friday, 04/07/2022 at 10am.  [name], MSW, LCSW Transitions of Care  Clinical Social Worker II [phone]

## 2022-04-06 ENCOUNTER — Other Ambulatory Visit (HOSPITAL_COMMUNITY): Payer: Self-pay

## 2022-04-06 DIAGNOSIS — E871 Hypo-osmolality and hyponatremia: Secondary | ICD-10-CM | POA: Diagnosis not present

## 2022-04-06 DIAGNOSIS — J431 Panlobular emphysema: Secondary | ICD-10-CM | POA: Diagnosis not present

## 2022-04-06 DIAGNOSIS — F0390 Unspecified dementia without behavioral disturbance: Secondary | ICD-10-CM | POA: Diagnosis not present

## 2022-04-06 DIAGNOSIS — I5022 Chronic systolic (congestive) heart failure: Secondary | ICD-10-CM | POA: Diagnosis not present

## 2022-04-06 LAB — BASIC METABOLIC PANEL
Anion gap: 7 (ref 5–15)
BUN: 8 mg/dL (ref 6–20)
CO2: 28 mmol/L (ref 22–32)
Calcium: 9.1 mg/dL (ref 8.9–10.3)
Chloride: 100 mmol/L (ref 98–111)
Creatinine, Ser: 0.77 mg/dL (ref 0.61–1.24)
GFR, Estimated: >60 mL/min (ref >60)
Glucose, Bld: 88 mg/dL (ref 70–99)
Potassium: 4.1 mmol/L (ref 3.5–5.1)
Sodium: 135 mmol/L (ref 135–145)

## 2022-04-06 MED ORDER — QUETIAPINE FUMARATE 50 MG PO TABS
50.0000 mg | ORAL_TABLET | Freq: Every day | ORAL | 0 refills | Status: DC
  Filled 2022-04-06: qty 30, 30d supply, fill #0

## 2022-04-06 MED ORDER — BUSPIRONE HCL 10 MG PO TABS
10.0000 mg | ORAL_TABLET | Freq: Three times a day (TID) | ORAL | 0 refills | Status: DC
  Filled 2022-04-06: qty 90, 30d supply, fill #0

## 2022-04-06 MED ORDER — ALPRAZOLAM 1 MG PO TABS
1.0000 mg | ORAL_TABLET | Freq: Two times a day (BID) | ORAL | 0 refills | Status: DC
  Filled 2022-04-06: qty 60, 30d supply, fill #0

## 2022-04-06 MED ORDER — POLYETHYLENE GLYCOL 3350 17 GM/SCOOP PO POWD
17.0000 g | Freq: Two times a day (BID) | ORAL | 0 refills | Status: DC
  Filled 2022-04-06: qty 476, 14d supply, fill #0

## 2022-04-06 MED ORDER — CARVEDILOL 25 MG PO TABS
25.0000 mg | ORAL_TABLET | Freq: Two times a day (BID) | ORAL | 0 refills | Status: DC
  Filled 2022-04-06: qty 60, 30d supply, fill #0

## 2022-04-06 MED ORDER — MELATONIN 3 MG PO TABS
3.0000 mg | ORAL_TABLET | Freq: Every day | ORAL | 0 refills | Status: DC
  Filled 2022-04-06: qty 30, 30d supply, fill #0

## 2022-04-06 MED ORDER — SENNOSIDES-DOCUSATE SODIUM 8.6-50 MG PO TABS
2.0000 | ORAL_TABLET | Freq: Two times a day (BID) | ORAL | 0 refills | Status: DC
  Filled 2022-04-06: qty 120, 30d supply, fill #0

## 2022-04-06 MED ORDER — ATORVASTATIN CALCIUM 40 MG PO TABS
40.0000 mg | ORAL_TABLET | Freq: Every day | ORAL | 0 refills | Status: AC
  Filled 2022-04-06: qty 30, 30d supply, fill #0

## 2022-04-06 MED ORDER — PREGABALIN 75 MG PO CAPS
75.0000 mg | ORAL_CAPSULE | Freq: Two times a day (BID) | ORAL | 0 refills | Status: DC
  Filled 2022-04-06: qty 60, 30d supply, fill #0

## 2022-04-06 MED ORDER — AMLODIPINE BESYLATE 10 MG PO TABS
10.0000 mg | ORAL_TABLET | Freq: Every day | ORAL | 0 refills | Status: DC
  Filled 2022-04-06: qty 30, 30d supply, fill #0

## 2022-04-06 MED ORDER — PALIPERIDONE ER 6 MG PO TB24
6.0000 mg | ORAL_TABLET | Freq: Every day | ORAL | 0 refills | Status: DC
  Filled 2022-04-06: qty 30, 30d supply, fill #0

## 2022-04-06 MED ORDER — MIRTAZAPINE 15 MG PO TABS
15.0000 mg | ORAL_TABLET | Freq: Every day | ORAL | 0 refills | Status: DC
  Filled 2022-04-06: qty 30, 30d supply, fill #0

## 2022-04-06 MED ORDER — DIGOXIN 250 MCG PO TABS
0.2500 mg | ORAL_TABLET | Freq: Every day | ORAL | 0 refills | Status: AC
Start: 2022-04-06 — End: ?
  Filled 2022-04-06: qty 30, 30d supply, fill #0

## 2022-04-06 MED ORDER — SODIUM CHLORIDE 1 G PO TABS
1.0000 g | ORAL_TABLET | Freq: Three times a day (TID) | ORAL | 0 refills | Status: DC
  Filled 2022-04-06: qty 90, 30d supply, fill #0

## 2022-04-06 MED ORDER — SACUBITRIL-VALSARTAN 49-51 MG PO TABS
1.0000 | ORAL_TABLET | Freq: Two times a day (BID) | ORAL | 0 refills | Status: DC
  Filled 2022-04-06: qty 60, 30d supply, fill #0

## 2022-04-06 MED ORDER — DIVALPROEX SODIUM ER 500 MG PO TB24
500.0000 mg | ORAL_TABLET | Freq: Two times a day (BID) | ORAL | 0 refills | Status: DC
  Filled 2022-04-06: qty 60, 30d supply, fill #0

## 2022-04-06 NOTE — Progress Notes (Signed)
  Progress Note  Patient: Jim Dunn [id number]:[id number] DOB: 1966-08-31  DOA: 09/24/2021  DOS: 04/06/2022    Brief hospital course: Jim Dunn is a 56 y.o. male with past medical history significant for paranoid schizophrenia, psychogenic polydipsia, HTN, chronic systolic CHF, history of seizures, COPD, alcohol/drug abuse, tobacco abuse who presented to [facility] ED on 1/21 via EMS for altered behavior per family.  He was drinking a lot of water and trying to burn things. Previously he was staying at group home and was kicked out. Neurology and psychiatry were consulted.   Hospital course remarkable for acute on chronic hyponatremia, breakthrough seizures, COVID infection. All medical issues are now quiescent and he has remained inpatient while attempting to arrange adequate placement. Ultimately DSS was involved and has assumed guardianship, arranged for group home placement. Please see problem-based details outlined below.  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 Continue salt tabs at lower dose of 1g TID.   Major neurocognitive disorder PTSD Paranoid schizophrenia with hallucinations Dementia without behavioral disturbance Patient lacks decision-making capacity. --Seroquel 25 mg p.o. nightly --Depakote ER 500mg  PO BID --Invega 6mg  PO daily --Xanax 1 mg p.o. twice daily, has not required other prn doses and has had no over sedation. --Mirtazapine 15 mg p.o. nightly --Melatonin nightly --[name] will transport the patient to group home 8/4 at 10am.   Seizure disorder:  --Depakote ER 500 g p.o. twice daily   Chronic systolic congestive heart failure, compensated: TTE 09/26/2021 with LVEF 30-35%, LV moderately decreased function, LV with global hypokinesis, LV mildly dilated, LV diastolic parameters normal, trivial MR, no aortic stenosis, IVC normal in  size. --Digoxin 0.25 mg p.o. daily --Entresto 49-51mg  po BID   --Carvedilol 25 mg p.o. twice daily --Note patient previously prescribed lasix but has not been receiving this here. And, despite being on salt tabs, has no edema or other evidence of hypervolemia, so lasix is not continued in a scheduled fashion.   Essential hypertension: --Amlodipine 10 mg p.o. daily --Carvedilol 25mg  po BID --Entresto 49-51 mg p.o. twice daily (increased from prior dose)   Hyperlipidemia: Rx atorvastatin 40 mg p.o. daily (was prescribed but not taking PTA)   Peripheral neuropathy --Continue lyrica, having titrated dose upward with improved symptoms, prescribed 75mg  po BID after PDMP review. Ceiling dose would be 100mg  TID.   Tobacco use disorder --Nicotine gum as needed, declines patch.   Obesity: Body mass index is 41.25 kg/m.  - Outpatient follow-up with PCP.    Covid-19 infection: Resolved. Treated, no longer requiring isolation.  Subjective: No new complaints. No swelling or chest pain or dyspnea.  Objective: Vitals:   04/05/22 2029 04/06/22 0007 04/06/22 0421 04/06/22 0757  BP: 131/84 98/61 (!) 144/88 (!) 174/89  Pulse: 78 66 69 77  Resp: 20 18 20 18   Temp: 97.9 F (36.6 C) 97.7 F (36.5 C) 97.6 F (36.4 C) 98 F (36.7 C)  TempSrc: Oral     SpO2: 96% 94% 98% 97%  Weight:      Height:      No distress, pleasant Clear, nonlabored No pitting edema   Family Communication: None at bedside  Disposition: Status is: Inpatient, planning discharge to group home 8/4. Prescriptions have been sent to [facility] pharmacy to be ready at time of transport at 10:00am.   06/06/22, MD 04/06/2022 9:40 AM Page by [email or web address]

## 2022-04-06 NOTE — Plan of Care (Signed)
  Problem: Education: Goal: Knowledge of General Education information will improve Description: Including pain rating scale, medication(s)/side effects and non-pharmacologic comfort measures Outcome: Completed/Met   Problem: Health Behavior/Discharge Planning: Goal: Ability to manage health-related needs will improve Outcome: Completed/Met   Problem: Clinical Measurements: Goal: Ability to maintain clinical measurements within normal limits will improve Outcome: Completed/Met   Problem: Health Behavior/Discharge Planning: Goal: Ability to manage health-related needs will improve Outcome: Completed/Met   Problem: Clinical Measurements: Goal: Ability to maintain clinical measurements within normal limits will improve Outcome: Completed/Met Goal: Will remain free from infection Outcome: Completed/Met

## 2022-04-06 NOTE — Progress Notes (Addendum)
CSW spoke with patient at bedside to discuss his discharge plan for tomorrow. Patient is ready for discharge. Patient's legal guardian [name] will transport him, pick up time scheduled for 10am.  CSW requested MD send all prescriptions to [facility] pharmacy to be filled before discharge.  [name], MSW, LCSW Transitions of Care  Clinical Social Worker II [phone]

## 2022-04-07 ENCOUNTER — Other Ambulatory Visit (HOSPITAL_COMMUNITY): Payer: Self-pay

## 2022-04-07 NOTE — Discharge Summary (Addendum)
Physician Discharge Summary   Patient: Jim Dunn MRN: [id number] DOB: 1966/03/24  Admit date:     09/24/2021  Discharge date: 04/07/22  Discharge Physician: Tyrone Nine   PCP: Pcp, No   Recommendations at discharge:  Continue to monitor BMP with regular PCP follow ups. Continue routine psychiatry follow up. Recommend cardiology follow up for chronic heart failure.  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 male with past medical history significant for paranoid schizophrenia, psychogenic polydipsia, HTN, chronic systolic CHF, history of seizures, COPD, alcohol/drug abuse, tobacco abuse who presented to [facility] ED on 1/21 via EMS for altered behavior per family.  He was drinking a lot of water and trying to burn things. Previously he was staying at group home and was kicked out. Neurology and psychiatry were consulted.   Hospital course remarkable for acute on chronic hyponatremia, breakthrough seizures, COVID infection. All medical issues are now quiescent and he has remained inpatient while attempting to arrange adequate placement. He will be discharged to a group home environment on 8/4 in stable condition with medications supplied prior to discharge.    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 Will recheck tomorrow after restarting sodium tabs today.   Major neurocognitive disorder PTSD Paranoid schizophrenia with  hallucinations Dementia without behavioral disturbance Patient lacks decision-making capacity. --Seroquel 25 mg p.o. nightly --Depakote ER 500mg  PO BID --Invega 6mg  PO daily --Xanax 1 mg p.o. twice daily --Mirtazapine 15 mg p.o. nightly --[name] will transport the patient to group home 8/4 at 10am.   Seizure disorder --Depakote ER 500 g p.o. twice daily   Chronic systolic congestive heart failure, compensated TTE 09/26/2021 with LVEF 30-35%, LV moderately decreased function, LV with global hypokinesis, LV mildly dilated, LV diastolic parameters normal, trivial MR, no aortic stenosis, IVC normal in size. --Digoxin 0.25 mg p.o. daily --Entresto 49-51mg  po BID   --Carvedilol 25 mg p.o. twice daily   Essential hypertension --Amlodipine 10 mg p.o. daily --Carvedilol --Entresto 49-51 mg p.o. twice daily   Hyperlipidemia --Atorvastatin 40 mg p.o. daily   Peripheral neuropathy --Continue lyrica, having titrated dose upward with improved symptoms. Ceiling dose would be 100mg  TID.   Tobacco use disorder --Nicotine gum as needed   Obesity: Body mass index is 41.25 kg/m.  - Outpatient follow-up with PCP.     Covid-19 infection: Resolved. Treated, no longer requiring isolation.  Consultants: Psychiatry, Neurology, PCCM Procedures performed: EEG 09/26/2021, Echo 09/26/2021  Disposition: Group home Diet recommendation: Fluid restriction 1,251ml/day DISCHARGE MEDICATION: Allergies as of 04/07/2022   No Known Allergies      Medication List     STOP taking these medications    chlorproMAZINE 100 MG tablet Commonly known as: THORAZINE   furosemide 20 MG tablet Commonly known as: LASIX   nicotine 14 mg/24hr patch Commonly known as: Nicoderm CQ   sacubitril-valsartan 24-26 MG Commonly known as: ENTRESTO Replaced by: 09/28/2021 49-51 MG       TAKE these medications    acetaminophen 325 MG tablet Commonly known  as: TYLENOL Take 2 tablets (650 mg total) by mouth every  4 (four) hours as needed for mild pain or fever.   ALPRAZolam 1 MG tablet Commonly known as: XANAX Take 1 tablet (1 mg total) by mouth 2 (two) times daily.   amLODipine 10 MG tablet Commonly known as: NORVASC Take 1 tablet (10 mg total) by mouth daily.   atorvastatin 40 MG tablet Commonly known as: LIPITOR Take 1 tablet (40 mg total) by mouth daily.   busPIRone 10 MG tablet Commonly known as: BUSPAR Take 1 tablet (10 mg total) by mouth 3 (three) times daily.   carvedilol 25 MG tablet Commonly known as: COREG Take 1 tablet (25 mg total) by mouth 2 (two) times daily with a meal.   digoxin 0.25 MG tablet Commonly known as: LANOXIN Take 1 tablet (0.25 mg total) by mouth daily.   divalproex 500 MG 24 hr tablet Commonly known as: DEPAKOTE ER Take 1 tablet (500 mg total) by mouth 2 (two) times daily. What changed:  how much to take when to take this   Entresto 49-51 MG Generic drug: sacubitril-valsartan Take 1 tablet by mouth 2 (two) times daily. Replaces: sacubitril-valsartan 24-26 MG   melatonin 3 MG Tabs tablet Take 1 tablet (3 mg total) by mouth at bedtime.   mirtazapine 15 MG tablet Commonly known as: REMERON Take 1 tablet (15 mg total) by mouth at bedtime.   paliperidone 6 MG 24 hr tablet Commonly known as: INVEGA Take 1 tablet (6 mg total) by mouth daily.   polyethylene glycol powder 17 GM/SCOOP powder Commonly known as: GLYCOLAX/MIRALAX Dissolve 17 gm in liquid and take by mouth 2 (two) times daily.   pregabalin 75 MG capsule Commonly known as: LYRICA Take 1 capsule (75 mg total) by mouth 2 (two) times daily.   QUEtiapine 50 MG tablet Commonly known as: SEROQUEL Take 1 tablet (50 mg total) by mouth at bedtime.   Senexon-S 8.6-50 MG tablet Generic drug: senna-docusate Take 2 tablets by mouth 2 (two) times daily.   sodium chloride 1 g tablet Take 1 tablet (1 g total) by mouth 3 (three) times daily with meals.        Follow-up Information      [facility] .   Specialty: Urgent Care Why: You can go to the behavioral health urgent care, anytime needed for problems with your schizophrenia, or feeling out of control and suicidal. Contact information: [address] [place] [address] [phone]        [facility]. Call.   Contact information: [address] [place] [address] [phone]               Discharge Exam: BP 133/79 (BP Location: Right Arm)   Pulse 87   Temp 98 F (36.7 C)   Resp 17   Ht 5\' 8"  (1.727 m)   Wt 123.1 kg   SpO2 98%   BMI 41.25 kg/m   Well-appearing pleasant male in no distress Clear, nonlabored RRR, no edema Alert, oriented, normal speech and gait.  Condition at discharge: good  The results of significant diagnostics from this hospitalization (including imaging, microbiology, ancillary and laboratory) are listed below for reference.   Imaging Studies: DG CHEST PORT 1 VIEW  Result Date: 03/19/2022 CLINICAL DATA:  Altered mental status EXAM: PORTABLE CHEST 1 VIEW COMPARISON:  10/31/2021 FINDINGS: Transverse diameter of Edwyna Shell is increased. Central pulmonary vessels are prominent. There are  no signs of alveolar pulmonary edema or focal pulmonary consolidation. There is no pleural effusion or pneumothorax. IMPRESSION: Cardiomegaly. Central pulmonary vessels are prominent, possibly suggesting mild CHF. There are no signs of alveolar pulmonary edema or new focal infiltrates. Electronically Signed   By: Ernie Avena M.D.   On: 03/19/2022 15:03    Microbiology: Results for orders placed or performed during the hospital encounter of 09/24/21  Resp Panel by RT-PCR (Flu A&B, Covid) Nasopharyngeal Swab     Status: None   Collection Time: 09/24/21  4:42 PM   Specimen: Nasopharyngeal Swab; Nasopharyngeal(NP) swabs in vial transport medium  Result Value Ref Range Status   SARS Coronavirus  2 by RT PCR NEGATIVE NEGATIVE Final    Comment: (NOTE) SARS-CoV-2 target nucleic acids are NOT DETECTED.  The SARS-CoV-2 RNA is generally detectable in upper respiratory specimens during the acute phase of infection. The lowest concentration of SARS-CoV-2 viral copies this assay can detect is 138 copies/mL. A negative result does not preclude SARS-Cov-2 infection and should not be used as the sole basis for treatment or other patient management decisions. A negative result may occur with  improper specimen collection/handling, submission of specimen other than nasopharyngeal swab, presence of viral mutation(s) within the areas targeted by this assay, and inadequate number of viral copies(<138 copies/mL). A negative result must be combined with clinical observations, patient history, and epidemiological information. The expected result is Negative.  Fact Sheet for Patients:  BloggerCourse.com  Fact Sheet for Healthcare Providers:  SeriousBroker.it  This test is no t yet approved or cleared by the [place] FDA and  has been authorized for detection and/or diagnosis of SARS-CoV-2 by FDA under an Emergency Use Authorization (EUA). This EUA will remain  in effect (meaning this test can be used) for the duration of the COVID-19 declaration under Section 564(b)(1) of the Act, 21 U.S.C.section 360bbb-3(b)(1), unless the authorization is terminated  or revoked sooner.       Influenza A by PCR NEGATIVE NEGATIVE Final   Influenza B by PCR NEGATIVE NEGATIVE Final    Comment: (NOTE) The Xpert Xpress SARS-CoV-2/FLU/RSV plus assay is intended as an aid in the diagnosis of influenza from Nasopharyngeal swab specimens and should not be used as a sole basis for treatment. Nasal washings and aspirates are unacceptable for Xpert Xpress SARS-CoV-2/FLU/RSV testing.  Fact Sheet for Patients: BloggerCourse.com  Fact  Sheet for Healthcare Providers: SeriousBroker.it  This test is not yet approved or cleared by the [place] FDA and has been authorized for detection and/or diagnosis of SARS-CoV-2 by FDA under an Emergency Use Authorization (EUA). This EUA will remain in effect (meaning this test can be used) for the duration of the COVID-19 declaration under Section 564(b)(1) of the Act, 21 U.S.C. section 360bbb-3(b)(1), unless the authorization is terminated or revoked.  Performed at [facility], [address] [place], [place] [address]   Resp Panel by RT-PCR (Flu A&B, Covid) Nasopharyngeal Swab     Status: None   Collection Time: 10/31/21  8:36 AM   Specimen: Nasopharyngeal Swab; Nasopharyngeal(NP) swabs in vial transport medium  Result Value Ref Range Status   SARS Coronavirus 2 by RT PCR NEGATIVE NEGATIVE Final    Comment: (NOTE) SARS-CoV-2 target nucleic acids are NOT DETECTED.  The SARS-CoV-2 RNA is generally detectable in upper respiratory specimens during the acute phase of infection. The lowest concentration of SARS-CoV-2 viral copies this assay can detect is 138 copies/mL. A negative result does not preclude SARS-Cov-2 infection  and should not be used as the sole basis for treatment or other patient management decisions. A negative result may occur with  improper specimen collection/handling, submission of specimen other than nasopharyngeal swab, presence of viral mutation(s) within the areas targeted by this assay, and inadequate number of viral copies(<138 copies/mL). A negative result must be combined with clinical observations, patient history, and epidemiological information. The expected result is Negative.  Fact Sheet for Patients:  BloggerCourse.com  Fact Sheet for Healthcare Providers:  SeriousBroker.it  This test is no t yet approved or cleared by the [place] FDA and  has been  authorized for detection and/or diagnosis of SARS-CoV-2 by FDA under an Emergency Use Authorization (EUA). This EUA will remain  in effect (meaning this test can be used) for the duration of the COVID-19 declaration under Section 564(b)(1) of the Act, 21 U.S.C.section 360bbb-3(b)(1), unless the authorization is terminated  or revoked sooner.       Influenza A by PCR NEGATIVE NEGATIVE Final   Influenza B by PCR NEGATIVE NEGATIVE Final    Comment: (NOTE) The Xpert Xpress SARS-CoV-2/FLU/RSV plus assay is intended as an aid in the diagnosis of influenza from Nasopharyngeal swab specimens and should not be used as a sole basis for treatment. Nasal washings and aspirates are unacceptable for Xpert Xpress SARS-CoV-2/FLU/RSV testing.  Fact Sheet for Patients: BloggerCourse.com  Fact Sheet for Healthcare Providers: SeriousBroker.it  This test is not yet approved or cleared by the [place] FDA and has been authorized for detection and/or diagnosis of SARS-CoV-2 by FDA under an Emergency Use Authorization (EUA). This EUA will remain in effect (meaning this test can be used) for the duration of the COVID-19 declaration under Section 564(b)(1) of the Act, 21 U.S.C. section 360bbb-3(b)(1), unless the authorization is terminated or revoked.  Performed at [facility], [address], [place], [place] [address]   Culture, blood (routine x 2)     Status: None   Collection Time: 10/31/21  8:54 AM   Specimen: Left Antecubital; Blood  Result Value Ref Range Status   Specimen Description LEFT ANTECUBITAL  Final   Special Requests   Final    BOTTLES DRAWN AEROBIC AND ANAEROBIC Blood Culture results may not be optimal due to an inadequate volume of blood received in culture bottles   Culture   Final    NO GROWTH 5 DAYS Performed at [facility], [address], [place], [place] [address]    Report Status 11/05/2021 FINAL  Final   Culture, blood (routine x 2)     Status: None   Collection Time: 10/31/21  9:03 AM   Specimen: BLOOD RIGHT ARM  Result Value Ref Range Status   Specimen Description BLOOD RIGHT ARM  Final   Special Requests   Final    BOTTLES DRAWN AEROBIC AND ANAEROBIC Blood Culture results may not be optimal due to an inadequate volume of blood received in culture bottles   Culture   Final    NO GROWTH 5 DAYS Performed at [facility], [address], [place], [place] [address]    Report Status 11/05/2021 FINAL  Final  Urine Culture     Status: Abnormal   Collection Time: 10/31/21  1:46 PM   Specimen: In/Out Cath Urine  Result Value Ref Range Status   Specimen Description IN/OUT CATH URINE  Final   Special Requests   Final    NONE Performed at [facility], [address]  [place], [place] [address]    Culture MULTIPLE SPECIES PRESENT, SUGGEST RECOLLECTION (A)  Final   Report Status 11/01/2021 FINAL  Final  SARS Coronavirus 2 by RT PCR (hospital order, performed in [facility] hospital lab) *cepheid single result test* Anterior Nasal Swab     Status: Abnormal   Collection Time: 03/19/22 10:50 AM   Specimen: Anterior Nasal Swab  Result Value Ref Range Status   SARS Coronavirus 2 by RT PCR POSITIVE (A) NEGATIVE Final    Comment: (NOTE) SARS-CoV-2 target nucleic acids are DETECTED  SARS-CoV-2 RNA is generally detectable in upper respiratory specimens  during the acute phase of infection.  Positive results are indicative  of the presence of the identified virus, but do not rule out bacterial infection or co-infection with other pathogens not detected by the test.  Clinical correlation with patient history and  other diagnostic information is necessary to determine patient infection status.  The expected result is negative.  Fact Sheet for Patients:   RoadLapTop.co.za   Fact Sheet for Healthcare Providers:   http://kim-miller.com/     This test is not yet approved or cleared by the [place] FDA and  has been authorized for detection and/or diagnosis of SARS-CoV-2 by FDA under an Emergency Use Authorization (EUA).  This EUA will remain in effect (meaning this test can be used) for the duration of  the COVID-19 declaration under Section 564(b)(1)  of the Act, 21 U.S.C. section 360-bbb-3(b)(1), unless the authorization is terminated or revoked sooner.   Performed at [facility], [address] [place], [place] [address]    Basic Metabolic Panel: Recent Labs  Lab 04/04/22 0500 04/06/22 0733  NA 132* 135  K 4.5 4.1  CL 100 100  CO2 25 28  GLUCOSE 91 88  BUN 8 8  CREATININE 0.73 0.77  CALCIUM 8.9 9.1   Discharge time spent: greater than 30 minutes.  Signed: Tyrone Nine, MD [facility] 04/07/2022

## 2022-04-07 NOTE — TOC Transition Note (Signed)
Transition of Care [facility]) - CM/SW Discharge Note   Patient Details  Name: Jim Dunn MRN: [id number] Date of Birth: Feb 16, 1966  [facility] CM/SW Contact:  Curlene Labrum, RN Phone Number: 04/07/2022, 10:11 AM   Clinical Narrative:    CM met with Barbarann Ehlers, DSS SW at the bedside and she was given discharge summary (3 copies - to be given to DSS and accepting facility - [facility]).  The patient is discharging now by car with the DSS SW and will be transported to the facility with all belongings.  TOC Medications for discharge were given to DSS SW to transport to the facility.  All medications were present and coordinated with the discharge summary prior to discharge.  Patient was discharged to [facility] in care to [place].   Final next level of care: Assisted Living (Assisted Living versus Group Home per DSS Guardian, Wayna Chalet, DSS SW) Barriers to Discharge:  (Pending placement by DSS Guardian)   Patient Goals and CMS Choice Patient states their goals for this hospitalization and ongoing recovery are:: Patient is agreeable to ALF/Group home placement. CMS Medicare.gov Compare Post Acute Care list provided to:: Patient Represenative (must comment) Wayna Chalet, DSS Guardian) Choice offered to / list presented to : Parent  Discharge Placement                       Discharge Plan and Services In-house Referral: Clinical Social Work Discharge Planning Services: Medication Assistance, CM Consult Post Acute Care Choice:  (Needs [facility]/ALF placement)                               Social Determinants of Health (SDOH) Interventions     Readmission Risk Interventions    01/18/2022    2:30 PM 01/23/2020    4:46 PM  Readmission Risk Prevention Plan  Transportation Screening Complete Complete  HRI or [place]  Complete  Social Work Consult for [place] Planning/Counseling  Complete  Palliative  Care Screening  Not Applicable  Medication Review Press photographer) Complete   PCP or Specialist appointment within 3-5 days of discharge Complete   HRI or [place] Complete   SW Recovery Care/Counseling Consult Complete   [place] Not Applicable

## 2022-05-02 ENCOUNTER — Observation Stay
Admission: EM | Admit: 2022-05-02 | Discharge: 2022-05-04 | Disposition: A | Discharge status: Home / Self Care | Payer: Medicare PPO | Attending: Family Medicine | Admitting: Family Medicine

## 2022-05-02 ENCOUNTER — Emergency Department: Payer: Medicare PPO

## 2022-05-02 ENCOUNTER — Other Ambulatory Visit: Payer: Self-pay

## 2022-05-02 ENCOUNTER — Emergency Department
Admission: EM | Admit: 2022-05-02 | Discharge: 2022-05-02 | Disposition: A | Discharge status: Home / Self Care | Payer: Medicare PPO | Source: Home / Self Care | Attending: Emergency Medicine | Admitting: Emergency Medicine

## 2022-05-02 DIAGNOSIS — Z20822 Contact with and (suspected) exposure to covid-19: Secondary | ICD-10-CM | POA: Insufficient documentation

## 2022-05-02 DIAGNOSIS — A419 Sepsis, unspecified organism: Secondary | ICD-10-CM | POA: Insufficient documentation

## 2022-05-02 DIAGNOSIS — I5022 Chronic systolic (congestive) heart failure: Secondary | ICD-10-CM | POA: Insufficient documentation

## 2022-05-02 DIAGNOSIS — R569 Unspecified convulsions: Secondary | ICD-10-CM | POA: Insufficient documentation

## 2022-05-02 DIAGNOSIS — E785 Hyperlipidemia, unspecified: Secondary | ICD-10-CM | POA: Diagnosis not present

## 2022-05-02 DIAGNOSIS — G40909 Epilepsy, unspecified, not intractable, without status epilepticus: Secondary | ICD-10-CM

## 2022-05-02 DIAGNOSIS — J449 Chronic obstructive pulmonary disease, unspecified: Secondary | ICD-10-CM | POA: Insufficient documentation

## 2022-05-02 DIAGNOSIS — Z87442 Personal history of urinary calculi: Secondary | ICD-10-CM | POA: Insufficient documentation

## 2022-05-02 DIAGNOSIS — E876 Hypokalemia: Secondary | ICD-10-CM | POA: Diagnosis not present

## 2022-05-02 DIAGNOSIS — F200 Paranoid schizophrenia: Secondary | ICD-10-CM | POA: Diagnosis present

## 2022-05-02 DIAGNOSIS — I110 Hypertensive heart disease with heart failure: Secondary | ICD-10-CM | POA: Insufficient documentation

## 2022-05-02 DIAGNOSIS — F419 Anxiety disorder, unspecified: Secondary | ICD-10-CM | POA: Insufficient documentation

## 2022-05-02 DIAGNOSIS — F4312 Post-traumatic stress disorder, chronic: Secondary | ICD-10-CM | POA: Diagnosis present

## 2022-05-02 DIAGNOSIS — R4689 Other symptoms and signs involving appearance and behavior: Principal | ICD-10-CM

## 2022-05-02 DIAGNOSIS — I5032 Chronic diastolic (congestive) heart failure: Secondary | ICD-10-CM | POA: Insufficient documentation

## 2022-05-02 DIAGNOSIS — F0390 Unspecified dementia without behavioral disturbance: Secondary | ICD-10-CM | POA: Diagnosis present

## 2022-05-02 DIAGNOSIS — I5023 Acute on chronic systolic (congestive) heart failure: Secondary | ICD-10-CM

## 2022-05-02 DIAGNOSIS — I10 Essential (primary) hypertension: Secondary | ICD-10-CM

## 2022-05-02 DIAGNOSIS — F209 Schizophrenia, unspecified: Secondary | ICD-10-CM | POA: Diagnosis not present

## 2022-05-02 DIAGNOSIS — F201 Disorganized schizophrenia: Secondary | ICD-10-CM | POA: Diagnosis present

## 2022-05-02 DIAGNOSIS — E222 Syndrome of inappropriate secretion of antidiuretic hormone: Secondary | ICD-10-CM | POA: Diagnosis present

## 2022-05-02 DIAGNOSIS — E871 Hypo-osmolality and hyponatremia: Secondary | ICD-10-CM | POA: Diagnosis present

## 2022-05-02 LAB — URINE DRUG SCREEN, QUALITATIVE (ARMC ONLY)
Amphetamines, Ur Screen: NOT DETECTED
Barbiturates, Ur Screen: NOT DETECTED
Benzodiazepine, Ur Scrn: POSITIVE — AB
Cannabinoid 50 Ng, Ur ~~LOC~~: POSITIVE — AB
Cocaine Metabolite,Ur ~~LOC~~: POSITIVE — AB
MDMA (Ecstasy)Ur Screen: NOT DETECTED
Methadone Scn, Ur: NOT DETECTED
Opiate, Ur Screen: NOT DETECTED
Phencyclidine (PCP) Ur S: NOT DETECTED
Tricyclic, Ur Screen: NOT DETECTED

## 2022-05-02 LAB — COMPREHENSIVE METABOLIC PANEL
ALT: 11 U/L (ref 0–44)
AST: 47 U/L — ABNORMAL HIGH (ref 15–41)
Albumin: 4.3 g/dL (ref 3.5–5.0)
Alkaline Phosphatase: 67 U/L (ref 38–126)
Anion gap: 14 (ref 5–15)
BUN: 8 mg/dL (ref 6–20)
CO2: 18 mmol/L — ABNORMAL LOW (ref 22–32)
Calcium: 9.3 mg/dL (ref 8.9–10.3)
Chloride: 103 mmol/L (ref 98–111)
Creatinine, Ser: 0.8 mg/dL (ref 0.61–1.24)
GFR, Estimated: >60 mL/min (ref >60)
Glucose, Bld: 119 mg/dL — ABNORMAL HIGH (ref 70–99)
Potassium: 3.9 mmol/L (ref 3.5–5.1)
Sodium: 135 mmol/L (ref 135–145)
Total Bilirubin: 0.9 mg/dL (ref 0.3–1.2)
Total Protein: 8.2 g/dL — ABNORMAL HIGH (ref 6.5–8.1)

## 2022-05-02 LAB — CBC WITH DIFFERENTIAL/PLATELET
Abs Immature Granulocytes: 0.04 10*3/uL (ref 0.00–0.07)
Basophils Absolute: 0 10*3/uL (ref 0.0–0.1)
Basophils Relative: 0 %
Eosinophils Absolute: 0.1 10*3/uL (ref 0.0–0.5)
Eosinophils Relative: 1 %
HCT: 43.9 % (ref 39.0–52.0)
Hemoglobin: 13.7 g/dL (ref 13.0–17.0)
Immature Granulocytes: 1 %
Lymphocytes Relative: 32 %
Lymphs Abs: 2.7 10*3/uL (ref 0.7–4.0)
MCH: 21.9 pg — ABNORMAL LOW (ref 26.0–34.0)
MCHC: 31.2 g/dL (ref 30.0–36.0)
MCV: 70.1 fL — ABNORMAL LOW (ref 80.0–100.0)
Monocytes Absolute: 0.8 10*3/uL (ref 0.1–1.0)
Monocytes Relative: 10 %
Neutro Abs: 4.9 10*3/uL (ref 1.7–7.7)
Neutrophils Relative %: 56 %
Platelets: 297 10*3/uL (ref 150–400)
RBC: 6.26 MIL/uL — ABNORMAL HIGH (ref 4.22–5.81)
RDW: 18.1 % — ABNORMAL HIGH (ref 11.5–15.5)
WBC: 8.6 10*3/uL (ref 4.0–10.5)
nRBC: 0 % (ref 0.0–0.2)

## 2022-05-02 LAB — ACETAMINOPHEN LEVEL: Acetaminophen (Tylenol), Serum: <10 ug/mL — ABNORMAL LOW (ref 10–30)

## 2022-05-02 LAB — SALICYLATE LEVEL: Salicylate Lvl: <7 mg/dL — ABNORMAL LOW (ref 7.0–30.0)

## 2022-05-02 LAB — RESP PANEL BY RT-PCR (FLU A&B, COVID) ARPGX2
Influenza A by PCR: NEGATIVE
Influenza B by PCR: NEGATIVE
SARS Coronavirus 2 by RT PCR: NEGATIVE

## 2022-05-02 LAB — ETHANOL: Alcohol, Ethyl (B): <10 mg/dL (ref <10)

## 2022-05-02 LAB — CBG MONITORING, ED
Glucose-Capillary: 136 mg/dL — ABNORMAL HIGH (ref 70–99)
Glucose-Capillary: 86 mg/dL (ref 70–99)

## 2022-05-02 LAB — VALPROIC ACID LEVEL: Valproic Acid Lvl: 81 ug/mL (ref 50.0–100.0)

## 2022-05-02 MED ORDER — LEVETIRACETAM IN NACL 1000 MG/100ML IV SOLN
1000.0000 mg | Freq: Once | INTRAVENOUS | Status: AC
  Administered 2022-05-03: 1000 mg via INTRAVENOUS
  Filled 2022-05-02: qty 100

## 2022-05-02 MED ORDER — DIVALPROEX SODIUM ER 250 MG PO TB24
500.0000 mg | ORAL_TABLET | Freq: Every day | ORAL | Status: DC
  Administered 2022-05-02: 500 mg via ORAL
  Filled 2022-05-02: qty 2

## 2022-05-02 MED ORDER — SODIUM CHLORIDE 0.9 % IV SOLN: 2000.0000 mg | Freq: Once | INTRAVENOUS | Status: DC

## 2022-05-02 MED ORDER — SODIUM CHLORIDE 0.9 % IV BOLUS (SEPSIS)
1000.0000 mL | Freq: Once | INTRAVENOUS | Status: AC
  Administered 2022-05-03: 1000 mL via INTRAVENOUS

## 2022-05-02 NOTE — Discharge Instructions (Addendum)
Your blood work was all reassuring.  You were given your a.m. dose of 500 mg of Depakote in the ED.  Continue to take the Depakote as prescribed.  Your Depakote level was therapeutic.  If you have recurrent seizures please return to the emergency department.  Otherwise please follow-up with neurology.

## 2022-05-02 NOTE — Discharge Instructions (Signed)
Follow-up with your regular doctor and psychiatrist.  Return to the ER for any new or worsening symptoms such as worsening invasive thoughts, hearing voices, or thoughts of wanting to harm yourself or anyone else.

## 2022-05-02 NOTE — ED Notes (Signed)
Pt in cab. Cleared with Group home owner, staff and legal guardian.

## 2022-05-02 NOTE — ED Notes (Signed)
Spoke with De'andra at [facility].  Notified that patient is up for discharge and would need someone to pick him up. De'andra states he would call and see if he could find someone to pick pt up and call me back.

## 2022-05-02 NOTE — ED Notes (Signed)
Pt Group home owner asking for an update once the Pt sees the doctor-  [name]- [phone]

## 2022-05-02 NOTE — ED Notes (Deleted)
PT  VOL/  PENDING  PLACEMENT 

## 2022-05-02 NOTE — ED Notes (Signed)
Hospital meal provided, pt tolerated w/o complaints.  Waste discarded appropriately.  

## 2022-05-02 NOTE — ED Notes (Signed)
Patient moved to small area due to exposing himself to other patients.

## 2022-05-02 NOTE — ED Notes (Addendum)
Spoke with [facility] DSS and they would like an update on his plan of care and if he has been using drugs. Ms. Jim Dunn was told we would update her as soon as we have more information about pt plan of care.

## 2022-05-02 NOTE — ED Notes (Signed)
PT  VOL  PENDING  CONSULT 

## 2022-05-02 NOTE — ED Notes (Signed)
Pt given drink, sandwich tray and icecream

## 2022-05-02 NOTE — ED Notes (Signed)
[name] returned call and states they have no one to pick up patient until the AM.

## 2022-05-02 NOTE — ED Notes (Signed)
Spoke with pts legal Jim Dunn [phone]) notified that patient is currently up for discharge and this writer would be calling group home to pick up patient.

## 2022-05-02 NOTE — ED Notes (Signed)
Patient was given food tray, but does not want food tray at this time.

## 2022-05-02 NOTE — ED Notes (Signed)
Group Home called to pick up pt - [phone]

## 2022-05-02 NOTE — ED Triage Notes (Signed)
Pt coming from group home on [facility] via EMS. Pt was found outside having a seizure, lasted for one minute. No other seizure activity on route. Ems reports pt takes Dilantin and BP meds, has not taken BP meds today. Pt reports still feeling foggy w/ trouble understand information at this time.

## 2022-05-02 NOTE — ED Provider Notes (Signed)
[facility] Provider Note    Event Date/Time   First MD Initiated Contact with Patient 05/02/22 1657     (approximate)   History   Psychiatric Evaluation   HPI  Roshawn Ayala is a 56 y.o. male with a history of paranoid schizophrenia, psychogenic polydipsia, hypertension, chronic systolic heart failure, seizure disorder, and COPD who presents for psychiatric evaluation.  The patient was seen in the ED earlier today after an apparent seizure.  He had a work-up performed and was cleared for discharge around midday.  He went back to the group home, but then returned to the ED and stated "to be honest I didn't have a seizure, I just have bad thoughts I can't get out of my head."  The patient describes to me that he is having thoughts where he hears people he knows saying bad things about him including that he is "nothing."  He denies suicidal or homicidal ideation.  He denies seeing or hearing things that are not there.  He states he has been on his medication and denies drug use.    Physical Exam   Triage Vital Signs: ED Triage Vitals  Enc Vitals Group     BP 05/02/22 1421 (!) 173/103     Pulse Rate 05/02/22 1421 97     Resp 05/02/22 1421 17     Temp 05/02/22 1421 98.2 F (36.8 C)     Temp Source 05/02/22 1421 Oral     SpO2 05/02/22 1421 97 %     Weight 05/02/22 1422 269 lb 13.5 oz (122.4 kg)     Height 05/02/22 1422 5\' 8"  (1.727 m)     Head Circumference --      Peak Flow --      Pain Score 05/02/22 1422 10     Pain Loc --      Pain Edu? --      Excl. in GC? --     Most recent vital signs: Vitals:   05/02/22 1421  BP: (!) 173/103  Pulse: 97  Resp: 17  Temp: 98.2 F (36.8 C)  SpO2: 97%     General: Awake, no distress.  CV:  Good peripheral perfusion.  Resp:  Normal effort.  Abd:  No distention.  Other:  Calm and cooperative.   ED Results / Procedures / Treatments   Labs (all labs ordered are listed, but only abnormal results are  displayed) Labs Reviewed  URINE DRUG SCREEN, QUALITATIVE ([facility] ONLY) - Abnormal; Notable for the following components:      Result Value   Cocaine Metabolite,Ur [place] POSITIVE (*)    Cannabinoid 50 Ng, Ur [place] POSITIVE (*)    Benzodiazepine, Ur [place] POSITIVE (*)    All other components within normal limits  ACETAMINOPHEN LEVEL - Abnormal; Notable for the following components:   Acetaminophen (Tylenol), Serum <10 (*)    All other components within normal limits  SALICYLATE LEVEL - Abnormal; Notable for the following components:   Salicylate Lvl <7.0 (*)    All other components within normal limits  RESP PANEL BY RT-PCR (FLU A&B, COVID) ARPGX2  ETHANOL     EKG     RADIOLOGY    PROCEDURES:  Critical Care performed: No  Procedures   MEDICATIONS ORDERED IN ED: Medications - No data to display   IMPRESSION / MDM / ASSESSMENT AND PLAN / ED COURSE  I reviewed the triage vital signs and the nursing notes.  56 year old male with PMH as noted  above presents due to concern for invasive thoughts.  He was seen in the ED earlier for an apparent seizure and medically cleared at that time.  I reviewed the past medical records.  Per the spouse discharge summary from 8/4, the patient was most recently admitted earlier this month with hyponatremia due to polygenic polydipsia.  I reviewed the labs from earlier today.  The sodium was normal.  Differential diagnosis includes, but is not limited to, schizophrenia, acute psychosis, medication noncompliance, substance induced mood disorder.  Patient's presentation is most consistent with exacerbation of chronic illness.  We will obtain additional labs for medical clearance, psychiatry and TTS consultations, and reassess.  The patient has been placed in psychiatric observation due to the need to provide a safe environment for the patient while obtaining psychiatric consultation and evaluation, as well as ongoing medical and medication management  to treat the patient's condition.  The patient has not been placed under full IVC at this time.   ----------------------------------------- 10:04 PM on 05/02/2022 -----------------------------------------  I consulted with NP Janee Morn from psychiatry.  She has evaluated the patient.  She does not recommend inpatient admission and has cleared him for discharge.  There is no evidence of acute danger to self or others.  The patient is stable for discharge at this time.  Return precautions have been provided.  We will contact the group home to arrange for disposition.   FINAL CLINICAL IMPRESSION(S) / ED DIAGNOSES   Final diagnoses:  Behavior concern     Rx / DC Orders   ED Discharge Orders     None        Note:  This document was prepared using Dragon voice recognition software and may include unintentional dictation errors.    Dionne Bucy, MD 05/02/22 2204

## 2022-05-02 NOTE — ED Provider Notes (Incomplete)
11:20 PM  Assumed care of patient.  Called to [facility] as patient had a witnessed seizure that was tonic-clonic lasting about 30 seconds.  Sonorous respirations, urinary and bowel incontinence, postictal afterwards.  History of seizures.  From a group home.  Was planning to be discharged back from the group home after he had been cleared by psychiatry today.  Getting a Keppra load here.   12:00 AM  Pt more awake and answering questions.  He denies any complaints.  Blood glucose normal.  Found to have a rectal temperature of 101.5.  Will initiate septic work-up.   1:11 AM  Pt's labs show leukocytosis of 11.2.  Normal electrolytes and renal function.  Normal LFTs.  CT head and cervical spine reviewed and interpreted by myself and the radiologist and show no acute abnormality.  Valproic acid level is therapeutic.  Lactic is 5.8 which could be a component of sepsis but also from seizure activity.  He is getting 30 mL/kg IV fluid bolus.  He is complaining of headache but has no meningismus on exam.  I feel given fever, headache in the setting of seizures that he will need lumbar puncture.  He is awake, alert and oriented and has been consented for this procedure and agrees.  He has received vancomycin and cefepime for broad coverage.  We will also add on acyclovir.  Will discuss with the hospitalist for admission.  2:03 AM  Consulted and discussed patient's case with hospitalist, Dr. Arville Care.  I have recommended admission and consulting physician agrees and will place admission orders.  Patient (and family if present) agree with this plan.   I reviewed all nursing notes, vitals, pertinent previous records.  All labs, EKGs, imaging ordered have been independently reviewed and interpreted by myself.  Chest x-ray reviewed and interpreted by myself and radiologist and shows vascular congestion without significant edema.  We will add on a BNP.  No known history of CHF.  His lungs are still clear to auscultation and his  sats are not 100% on room air.  He does have some tachypnea but no respiratory distress.  He has received 2 L of fluids.  Will monitor closely.  Repeat lactic due now.  Lumbar puncture performed with some difficulty due to patient's confusion and likely still slightly postictal but CSF has been sent and is pending.  2:41 AM  Pt's repeat lactic is down to 1.6 and his blood pressures are still normal.  We will slow down his IV fluids and hold giving the third and fourth liter bolus due to concerns for vascular congestion on chest x-ray.   CRITICAL CARE Performed by: Rochele Raring   Total critical care time: 45 minutes  Critical care time was exclusive of separately billable procedures and treating other patients.  Critical care was necessary to treat or prevent imminent or life-threatening deterioration.  Critical care was time spent personally by me on the following activities: development of treatment plan with patient and/or surrogate as well as nursing, discussions with consultants, evaluation of patient's response to treatment, examination of patient, obtaining history from patient or surrogate, ordering and performing treatments and interventions, ordering and review of laboratory studies, ordering and review of radiographic studies, pulse oximetry and re-evaluation of patient's condition.    [name]1-3 Lead EKG Interpretation  Performed by: Manu Rubey, Layla Maw, DO Authorized by: Kessler Kopinski, Layla Maw, DO     Interpretation: abnormal     ECG rate:  104   ECG rate assessment: tachycardic  Rhythm: sinus tachycardia     Ectopy: none     Conduction: normal   .Lumbar Puncture  Date/Time: 05/03/2022 2:05 AM  Performed by: Collie Wernick, Layla Maw, DO Authorized by: Seanpatrick Maisano, Layla Maw, DO   Consent:    Consent obtained:  Emergent situation and verbal   Consent given by:  Patient   Risks, benefits, and alternatives were discussed: yes     Risks discussed:  Bleeding, headache, nerve damage, infection, pain  and repeat procedure Universal protocol:    Procedure explained and questions answered to patient or proxy's satisfaction: yes     Relevant documents present and verified: yes     Test results available: yes     Imaging studies available: yes     Required blood products, implants, devices, and special equipment available: yes     Immediately prior to procedure a time out was called: yes     Site/side marked: yes     Patient identity confirmed:  Verbally with patient and arm band Pre-procedure details:    Procedure purpose:  Diagnostic   Preparation: Patient was prepped and draped in usual sterile fashion   Anesthesia:    Anesthesia method:  Local infiltration   Local anesthetic:  Lidocaine 1% w/o epi Procedure details:    Lumbar space:  L3-L4 interspace   Patient position:  Sitting   Needle gauge:  20   Needle type:  Spinal needle - Quincke tip   Needle length (in):  3.5   Ultrasound guidance: no     Number of attempts:  3   Fluid appearance:  Blood-tinged then clearing   Tubes of fluid:  4   Total volume (ml):  8 Post-procedure details:    Puncture site:  Adhesive bandage applied and direct pressure applied   Procedure completion:  Tolerated with difficulty     Breonia Kirstein, Layla Maw, DO 05/03/22 0206    Gloria Ricardo, Layla Maw, DO 05/03/22 1610

## 2022-05-02 NOTE — ED Notes (Signed)
Refused v/s

## 2022-05-02 NOTE — ED Notes (Addendum)
Writer in the Nurses station when there was a thud in from pt's room. Pt was seen having a seizure. Writer called charge and ran into pt's room. Pt was found on his back. Pt was turned on to his side, and head was supported. seizure had lasted about 30 seconds. Pt had stopped seizing and was then started snoring, foaming was also noticed on pt's face. No other visible injures were noted at this time. RN Glenna Fellows Dr Ward, Dr. Wacey Levins, and Dr Darnelle Catalan arrived with a stretcher. Pt was lifted on to the stretcher. Pt was transported from BHU to rm 24

## 2022-05-02 NOTE — ED Notes (Signed)
Pt out of room into day room. Seen by security taking his penis out of his pants. Several females in the dayroom at this time. Pt told by security and nursing staff to keep his penis inside his pants or he would have to go to the 3 bed locked area of BHU by himself. Pt verbalized understanding. Pt shown to the restroom and encouraged to void.

## 2022-05-02 NOTE — ED Triage Notes (Signed)
Pt was brought here by [facility]. Pt states in front of myself and [name], tech " To be honest I didn't have a seizure, I just have bad thoughts I cant get out of my head they are all lies." Pt again states he has bad thought because of the neighborhood he grew up in.

## 2022-05-02 NOTE — ED Notes (Signed)
Spoke with pts legal Felix Pacini [phone]) as well as the group home, [facility] [phone]) [name] to have Benedetto Goad come to pick up pt and take him back to his home. Both parties have agreed. Number to call staff at the group home is [phone]) Di[place].

## 2022-05-02 NOTE — ED Notes (Signed)
Pt changed into [place] scrubs  Pt belongings;  Juanita Craver t shirt  2 pairs grey shorts  2 sandals

## 2022-05-02 NOTE — ED Provider Notes (Signed)
[facility] Provider Note    Event Date/Time   First MD Initiated Contact with Patient 05/02/22 [phone]     (approximate)   History   Seizures   HPI  Jim Dunn is a 56 y.o. male with past medical history of paranoid schizophrenia, psychogenic polydipsia, hypertension, chronic systolic heart failure, seizure disorder, COPD presents after seizure.  Per triage note patient was outside his group home in [facility] when he was found to be having a seizure.  There was no further seizure activity on route.  Patient has somewhat difficulty providing history says he has been sick for some time and that he vomited a while ago.  Says he has been needing to get help for a while should have come in before.  Not sure if he had a seizure.  He tells me that he thinks today if he did have a seizure would have been the first seizure that he had since leaving the hospital on 8/4.  Patient denies complaints currently.  Says he is trying to fluid restrict and not drink too much water.  Says been taking his medications every day.  Patient recently had prolonged 43-month admission was discharged at the beginning of this month.  He was admitted for chronic hyponatremia, altered mental status and seizures.  Hyponatremia was due to presumed psychogenic polydipsia he was started on sodium tabs and was fluid restricted.  Psychiatry and neurology were consulted.  His mental status was thought to be due to underlying psychiatric disease.  MRI and EEG were normal in the hospital he was continued on Depakote.  Patient was thought to lack decision-making capacity.  Admission was still prolonged because of placement issues.    Past Medical History:  Diagnosis Date   CHF (congestive heart failure) (HCC)    COPD (chronic obstructive pulmonary disease) ([place])    History of kidney stones    Hyponatremia    Paranoid schizophrenia ([place])    Seizures ([place])    Tobacco abuse     Patient Active Problem  List   Diagnosis Date Noted   Obesity (BMI 30-39.9) 02/07/2022   Hyponatremia    Peripheral neuropathy 10/20/2021   Dementia without behavioral disturbance ([place]) 09/25/2021   Needs assistance with community resources 09/12/2021   Disorganized schizophrenia ([place])    Dementia ([place]) 06/08/2020   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/2020   Chronic post-traumatic stress disorder (PTSD)    Noncompliance with medications    Subtherapeutic serum dilantin level    Seizure ([place])    Hypomagnesemia 02/23/2020   Suicidal ideation 02/23/2020   Paranoid schizophrenia with hallucinations    Hypokalemia    Chronic systolic CHF (congestive heart failure) ([place]) 01/22/2020   Mixed hyperlipidemia 01/22/2020   COPD (chronic obstructive pulmonary disease) ([place]) 01/22/2020   Microcytic anemia 01/22/2020   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 --      Excl. in GC? --     Most recent vital signs: Vitals:   05/02/22 0800 05/02/22 0830  BP: (!) 159/97 (!) 146/100  Pulse: 95 84  Resp: (!) 22 (!) 4  Temp: 98.6 F (37 C)   SpO2: 96% 96%     General: Awake, no distress.  CV:  Good peripheral perfusion.  Resp:  Normal effort.  Abd:  No distention.  Neuro:             Awake, Alert, Oriented x 3  Other:  Aox3, nml speech  PERRL, EOMI, face symmetric, nml tongue movement  5/5 strength in the BL upper and lower extremities  Sensation grossly intact in the BL upper and lower extremities  Finger-nose-finger intact BL Psych: Patient is calm and  cooperative, he is somewhat tangential but redirectable and can answer questions appropriately   ED Results / Procedures / Treatments  Labs (all labs ordered are listed, but only abnormal results are displayed) Labs Reviewed  COMPREHENSIVE METABOLIC PANEL - Abnormal; Notable for the following components:      Result Value   CO2 18 (*)    Glucose, Bld 119 (*)    Total Protein 8.2 (*)    AST 47 (*)    All other components within normal limits  CBC WITH DIFFERENTIAL/PLATELET - Abnormal; Notable for the following components:   RBC 6.26 (*)    MCV 70.1 (*)    MCH 21.9 (*)    RDW 18.1 (*)    All other components within normal limits  CBG MONITORING, ED - Abnormal; Notable for the following components:   Glucose-Capillary 136 (*)    All other components within normal limits  VALPROIC ACID LEVEL     EKG  EKG interpreted myself shows normal sinus rhythm with right axis deviation, prolonged PR interval, T wave inversions from V2 through V6   RADIOLOGY    PROCEDURES:  Critical Care performed: No  Procedures  The patient is on the cardiac monitor to evaluate for evidence of arrhythmia and/or significant heart rate changes.   MEDICATIONS ORDERED IN ED: Medications  divalproex (DEPAKOTE ER) 24 hr tablet 500 mg (500 mg Oral Given 05/02/22 1015)     IMPRESSION / MDM / ASSESSMENT AND PLAN / ED COURSE  I reviewed the triage vital signs and the nursing notes.                              Patient's presentation is most consistent with acute presentation with potential threat to life or bodily function.  Differential diagnosis includes, but is not limited to, breakthrough seizure secondary to medication noncompliance, hyponatremia, infection, electrolyte abnormality    Patient is a 56 year old male with significant psychiatric history seizures cardiac history presents with seizure.  Apparently was outside of his group home and had witnessed seizure-like activity.  No further  seizure activity with EMS did not receive any benzos in the field.  Patient is alert and oriented on my evaluation he has a nonfocal neurologic exam.  Somewhat tangential but he is cooperative and redirectable.  Reviewed recent discharge summary from prolonged admission for which she was admitted for psychogenic polydipsia and seizures.  Was seen by psychiatry and optimized but placement was difficult.  Was discharged at the beginning of this month.  He  is on Depakote.  Patient tells me his been taking all his medications.  Plan to check labs to ensure no significant hyponatremia as the cause of the seizure.  We will check Depakote level as well.  Given patient has known seizures without signs of trauma and nonfocal neurologic exam do not feel that he needs head imaging at this time.  Is overall reassuring sodium 135 bicarb mildly low at 18 likely in the setting of lactic acidosis from seizure.  CBC also reassuring.  Depakote level is therapeutic at 81.  He was given his a.m. dose of Depakote in the ED.  This point now that he is back to baseline with 1 single breakthrough seizure I think he is appropriate for discharge.   FINAL CLINICAL IMPRESSION(S) / ED DIAGNOSES   Final diagnoses:  Seizure (HCC)     Rx / DC Orders   ED Discharge Orders     None        Note:  This document was prepared using Dragon voice recognition software and may include unintentional dictation errors.   Georga Hacking, MD 05/02/22 1023

## 2022-05-03 ENCOUNTER — Emergency Department: Payer: Medicare PPO

## 2022-05-03 DIAGNOSIS — I5023 Acute on chronic systolic (congestive) heart failure: Secondary | ICD-10-CM | POA: Diagnosis not present

## 2022-05-03 DIAGNOSIS — F419 Anxiety disorder, unspecified: Secondary | ICD-10-CM

## 2022-05-03 DIAGNOSIS — E785 Hyperlipidemia, unspecified: Secondary | ICD-10-CM

## 2022-05-03 DIAGNOSIS — R569 Unspecified convulsions: Secondary | ICD-10-CM | POA: Diagnosis not present

## 2022-05-03 DIAGNOSIS — G40909 Epilepsy, unspecified, not intractable, without status epilepticus: Secondary | ICD-10-CM

## 2022-05-03 DIAGNOSIS — A419 Sepsis, unspecified organism: Secondary | ICD-10-CM | POA: Diagnosis not present

## 2022-05-03 DIAGNOSIS — E876 Hypokalemia: Secondary | ICD-10-CM

## 2022-05-03 DIAGNOSIS — E871 Hypo-osmolality and hyponatremia: Secondary | ICD-10-CM

## 2022-05-03 DIAGNOSIS — I10 Essential (primary) hypertension: Secondary | ICD-10-CM

## 2022-05-03 LAB — PROCALCITONIN
Procalcitonin: <0.1 ng/mL
Procalcitonin: <0.1 ng/mL

## 2022-05-03 LAB — LACTIC ACID, PLASMA
Lactic Acid, Venous: 1.6 mmol/L (ref 0.5–1.9)
Lactic Acid, Venous: 5.8 mmol/L (ref 0.5–1.9)

## 2022-05-03 LAB — URINE DRUG SCREEN, QUALITATIVE (ARMC ONLY)
Amphetamines, Ur Screen: NOT DETECTED
Barbiturates, Ur Screen: NOT DETECTED
Benzodiazepine, Ur Scrn: POSITIVE — AB
Cannabinoid 50 Ng, Ur ~~LOC~~: NOT DETECTED
Cocaine Metabolite,Ur ~~LOC~~: POSITIVE — AB
MDMA (Ecstasy)Ur Screen: NOT DETECTED
Methadone Scn, Ur: NOT DETECTED
Opiate, Ur Screen: NOT DETECTED
Phencyclidine (PCP) Ur S: NOT DETECTED
Tricyclic, Ur Screen: NOT DETECTED

## 2022-05-03 LAB — CBC WITH DIFFERENTIAL/PLATELET
Abs Immature Granulocytes: 0.06 10*3/uL (ref 0.00–0.07)
Basophils Absolute: 0.1 10*3/uL (ref 0.0–0.1)
Basophils Relative: 0 %
Eosinophils Absolute: 0.1 10*3/uL (ref 0.0–0.5)
Eosinophils Relative: 1 %
HCT: 42.5 % (ref 39.0–52.0)
Hemoglobin: 13.6 g/dL (ref 13.0–17.0)
Immature Granulocytes: 1 %
Lymphocytes Relative: 37 %
Lymphs Abs: 4.1 10*3/uL — ABNORMAL HIGH (ref 0.7–4.0)
MCH: 22.2 pg — ABNORMAL LOW (ref 26.0–34.0)
MCHC: 32 g/dL (ref 30.0–36.0)
MCV: 69.3 fL — ABNORMAL LOW (ref 80.0–100.0)
Monocytes Absolute: 1.3 10*3/uL — ABNORMAL HIGH (ref 0.1–1.0)
Monocytes Relative: 12 %
Neutro Abs: 5.6 10*3/uL (ref 1.7–7.7)
Neutrophils Relative %: 49 %
Platelets: 219 10*3/uL (ref 150–400)
RBC: 6.13 MIL/uL — ABNORMAL HIGH (ref 4.22–5.81)
RDW: 16.8 % — ABNORMAL HIGH (ref 11.5–15.5)
Smear Review: NORMAL
WBC: 11.2 10*3/uL — ABNORMAL HIGH (ref 4.0–10.5)
nRBC: 0 % (ref 0.0–0.2)

## 2022-05-03 LAB — PROTEIN AND GLUCOSE, CSF
Glucose, CSF: 64 mg/dL (ref 40–70)
Total  Protein, CSF: 40 mg/dL (ref 15–45)

## 2022-05-03 LAB — COMPREHENSIVE METABOLIC PANEL
ALT: 14 U/L (ref 0–44)
AST: 46 U/L — ABNORMAL HIGH (ref 15–41)
Albumin: 4.3 g/dL (ref 3.5–5.0)
Alkaline Phosphatase: 64 U/L (ref 38–126)
Anion gap: 14 (ref 5–15)
BUN: 10 mg/dL (ref 6–20)
CO2: 21 mmol/L — ABNORMAL LOW (ref 22–32)
Calcium: 9.2 mg/dL (ref 8.9–10.3)
Chloride: 100 mmol/L (ref 98–111)
Creatinine, Ser: 0.88 mg/dL (ref 0.61–1.24)
GFR, Estimated: >60 mL/min (ref >60)
Glucose, Bld: 95 mg/dL (ref 70–99)
Potassium: 3.4 mmol/L — ABNORMAL LOW (ref 3.5–5.1)
Sodium: 135 mmol/L (ref 135–145)
Total Bilirubin: 0.6 mg/dL (ref 0.3–1.2)
Total Protein: 8 g/dL (ref 6.5–8.1)

## 2022-05-03 LAB — VALPROIC ACID LEVEL: Valproic Acid Lvl: 88 ug/mL (ref 50.0–100.0)

## 2022-05-03 LAB — BASIC METABOLIC PANEL
Anion gap: 7 (ref 5–15)
BUN: 9 mg/dL (ref 6–20)
CO2: 22 mmol/L (ref 22–32)
Calcium: 8.1 mg/dL — ABNORMAL LOW (ref 8.9–10.3)
Chloride: 105 mmol/L (ref 98–111)
Creatinine, Ser: 0.7 mg/dL (ref 0.61–1.24)
GFR, Estimated: >60 mL/min (ref >60)
Glucose, Bld: 94 mg/dL (ref 70–99)
Potassium: 3.1 mmol/L — ABNORMAL LOW (ref 3.5–5.1)
Sodium: 134 mmol/L — ABNORMAL LOW (ref 135–145)

## 2022-05-03 LAB — CSF CELL COUNT WITH DIFFERENTIAL
Eosinophils, CSF: 0 %
Lymphs, CSF: 47 %
Monocyte-Macrophage-Spinal Fluid: 0 %
RBC Count, CSF: 65 /mm3 — ABNORMAL HIGH (ref 0–3)
RBC Count, CSF: 315 /mm3 — ABNORMAL HIGH (ref 0–3)
Segmented Neutrophils-CSF: 53 %
Tube #: 4
Tube #: 1
WBC, CSF: 0 /mm3 (ref 0–5)
WBC, CSF: 2 /mm3 (ref 0–5)

## 2022-05-03 LAB — URINALYSIS, ROUTINE W REFLEX MICROSCOPIC
Bilirubin Urine: NEGATIVE
Glucose, UA: NEGATIVE mg/dL
Hgb urine dipstick: NEGATIVE
Ketones, ur: 5 mg/dL — AB
Leukocytes,Ua: NEGATIVE
Nitrite: NEGATIVE
Protein, ur: 100 mg/dL — AB
Specific Gravity, Urine: 1.026 (ref 1.005–1.030)
pH: 5 (ref 5.0–8.0)

## 2022-05-03 LAB — CBC
HCT: 37.5 % — ABNORMAL LOW (ref 39.0–52.0)
Hemoglobin: 12.1 g/dL — ABNORMAL LOW (ref 13.0–17.0)
MCH: 22.1 pg — ABNORMAL LOW (ref 26.0–34.0)
MCHC: 32.3 g/dL (ref 30.0–36.0)
MCV: 68.6 fL — ABNORMAL LOW (ref 80.0–100.0)
Platelets: 226 10*3/uL (ref 150–400)
RBC: 5.47 MIL/uL (ref 4.22–5.81)
RDW: 16.1 % — ABNORMAL HIGH (ref 11.5–15.5)
WBC: 10.7 10*3/uL — ABNORMAL HIGH (ref 4.0–10.5)
nRBC: 0 % (ref 0.0–0.2)

## 2022-05-03 LAB — SARS CORONAVIRUS 2 BY RT PCR: SARS Coronavirus 2 by RT PCR: NEGATIVE

## 2022-05-03 LAB — PROTIME-INR
INR: 1.1 (ref 0.8–1.2)
INR: 1.1 (ref 0.8–1.2)
Prothrombin Time: 13.6 seconds (ref 11.4–15.2)
Prothrombin Time: 14.5 seconds (ref 11.4–15.2)

## 2022-05-03 LAB — MAGNESIUM: Magnesium: 2.1 mg/dL (ref 1.7–2.4)

## 2022-05-03 LAB — CORTISOL-AM, BLOOD: Cortisol - AM: 11.2 ug/dL (ref 6.7–22.6)

## 2022-05-03 LAB — HIV ANTIBODY (ROUTINE TESTING W REFLEX): HIV Screen 4th Generation wRfx: NONREACTIVE

## 2022-05-03 MED ORDER — PALIPERIDONE ER 3 MG PO TB24
6.0000 mg | ORAL_TABLET | Freq: Every day | ORAL | Status: DC
  Administered 2022-05-03 – 2022-05-04 (×2): 6 mg via ORAL
  Filled 2022-05-03 (×2): qty 2

## 2022-05-03 MED ORDER — ACYCLOVIR SODIUM 50 MG/ML IV SOLN
10.0000 mg/kg | Freq: Three times a day (TID) | INTRAVENOUS | Status: DC
Start: 2022-05-03 — End: 2022-05-03
  Filled 2022-05-03: qty 13.7

## 2022-05-03 MED ORDER — SODIUM CHLORIDE 0.9 % IV BOLUS (SEPSIS)
1000.0000 mL | Freq: Once | INTRAVENOUS | Status: AC
  Administered 2022-05-03: 1000 mL via INTRAVENOUS

## 2022-05-03 MED ORDER — ACYCLOVIR SODIUM 50 MG/ML IV SOLN
685.0000 mg | Freq: Three times a day (TID) | INTRAVENOUS | Status: DC
Start: 2022-05-03 — End: 2022-05-03

## 2022-05-03 MED ORDER — MIDAZOLAM HCL (PF) 10 MG/2ML IJ SOLN: 10.0000 mg | Freq: Once | INTRAMUSCULAR | Status: DC | PRN

## 2022-05-03 MED ORDER — MAGNESIUM HYDROXIDE 400 MG/5ML PO SUSP
30.0000 mL | Freq: Every day | ORAL | Status: DC | PRN
Start: 2022-05-03 — End: 2022-05-04

## 2022-05-03 MED ORDER — ACYCLOVIR SODIUM 50 MG/ML IV SOLN: 1000.0000 mg | Freq: Three times a day (TID) | INTRAVENOUS | Status: DC

## 2022-05-03 MED ORDER — VANCOMYCIN HCL 1500 MG/300ML IV SOLN
1500.0000 mg | Freq: Once | INTRAVENOUS | Status: AC
  Administered 2022-05-03: 1500 mg via INTRAVENOUS
  Filled 2022-05-03: qty 300

## 2022-05-03 MED ORDER — ATORVASTATIN CALCIUM 20 MG PO TABS
40.0000 mg | ORAL_TABLET | Freq: Every day | ORAL | Status: DC
  Administered 2022-05-03 – 2022-05-04 (×2): 40 mg via ORAL
  Filled 2022-05-03 (×2): qty 2

## 2022-05-03 MED ORDER — VANCOMYCIN HCL 1250 MG/250ML IV SOLN
1250.0000 mg | Freq: Two times a day (BID) | INTRAVENOUS | Status: DC
  Filled 2022-05-03: qty 250

## 2022-05-03 MED ORDER — SODIUM CHLORIDE 0.9 % IV SOLN: 2.0000 g | Freq: Three times a day (TID) | INTRAVENOUS | Status: DC

## 2022-05-03 MED ORDER — DIGOXIN 250 MCG PO TABS
0.2500 mg | ORAL_TABLET | Freq: Every day | ORAL | Status: DC
  Administered 2022-05-03 – 2022-05-04 (×2): 0.25 mg via ORAL
  Filled 2022-05-03 (×2): qty 1

## 2022-05-03 MED ORDER — VANCOMYCIN HCL IN DEXTROSE 1-5 GM/200ML-% IV SOLN: 1000.0000 mg | Freq: Once | INTRAVENOUS | Status: DC

## 2022-05-03 MED ORDER — CEFEPIME HCL 2 G IV SOLR
2.0000 g | Freq: Once | INTRAVENOUS | Status: AC
  Administered 2022-05-03: 2 g via INTRAVENOUS
  Filled 2022-05-03: qty 12.5

## 2022-05-03 MED ORDER — CARVEDILOL 25 MG PO TABS
25.0000 mg | ORAL_TABLET | Freq: Two times a day (BID) | ORAL | Status: DC
  Administered 2022-05-03 – 2022-05-04 (×4): 25 mg via ORAL
  Filled 2022-05-03 (×4): qty 1

## 2022-05-03 MED ORDER — MIRTAZAPINE 15 MG PO TABS
15.0000 mg | ORAL_TABLET | Freq: Every day | ORAL | Status: DC
  Administered 2022-05-03: 15 mg via ORAL
  Filled 2022-05-03: qty 1

## 2022-05-03 MED ORDER — ACETAMINOPHEN 500 MG PO TABS
1000.0000 mg | ORAL_TABLET | Freq: Once | ORAL | Status: AC
  Administered 2022-05-03: 1000 mg via ORAL
  Filled 2022-05-03: qty 2

## 2022-05-03 MED ORDER — ALPRAZOLAM 1 MG PO TABS
1.0000 mg | ORAL_TABLET | Freq: Two times a day (BID) | ORAL | Status: DC
  Administered 2022-05-03 – 2022-05-04 (×3): 1 mg via ORAL
  Filled 2022-05-03 (×2): qty 1
  Filled 2022-05-03: qty 2

## 2022-05-03 MED ORDER — ONDANSETRON HCL 4 MG/2ML IJ SOLN: 4.0000 mg | Freq: Four times a day (QID) | INTRAMUSCULAR | Status: DC | PRN

## 2022-05-03 MED ORDER — ONDANSETRON HCL 4 MG PO TABS: 4.0000 mg | ORAL_TABLET | Freq: Four times a day (QID) | ORAL | Status: DC | PRN

## 2022-05-03 MED ORDER — IBUPROFEN 800 MG PO TABS
800.0000 mg | ORAL_TABLET | Freq: Once | ORAL | Status: AC
  Administered 2022-05-03: 800 mg via ORAL
  Filled 2022-05-03: qty 1

## 2022-05-03 MED ORDER — VANCOMYCIN HCL IN DEXTROSE 1-5 GM/200ML-% IV SOLN
1000.0000 mg | Freq: Once | INTRAVENOUS | Status: AC
  Administered 2022-05-03: 1000 mg via INTRAVENOUS
  Filled 2022-05-03: qty 200

## 2022-05-03 MED ORDER — SODIUM CHLORIDE 0.9 % IV BOLUS (SEPSIS): 1000.0000 mL | Freq: Once | INTRAVENOUS | Status: DC

## 2022-05-03 MED ORDER — BUSPIRONE HCL 10 MG PO TABS
10.0000 mg | ORAL_TABLET | Freq: Three times a day (TID) | ORAL | Status: DC
  Administered 2022-05-03 – 2022-05-04 (×5): 10 mg via ORAL
  Filled 2022-05-03: qty 2
  Filled 2022-05-03 (×2): qty 1
  Filled 2022-05-03: qty 2
  Filled 2022-05-03: qty 1

## 2022-05-03 MED ORDER — QUETIAPINE FUMARATE 25 MG PO TABS
50.0000 mg | ORAL_TABLET | Freq: Every day | ORAL | Status: DC
  Administered 2022-05-03: 50 mg via ORAL
  Filled 2022-05-03: qty 2

## 2022-05-03 MED ORDER — POTASSIUM CHLORIDE 20 MEQ PO PACK
40.0000 meq | PACK | Freq: Once | ORAL | Status: AC
  Administered 2022-05-03: 40 meq via ORAL
  Filled 2022-05-03: qty 2

## 2022-05-03 MED ORDER — LORAZEPAM 2 MG/ML IJ SOLN: 1.0000 mg | INTRAMUSCULAR | Status: DC | PRN

## 2022-05-03 MED ORDER — ORAL CARE MOUTH RINSE: 15.0000 mL | OROMUCOSAL | Status: DC | PRN

## 2022-05-03 MED ORDER — ENOXAPARIN SODIUM 60 MG/0.6ML IJ SOSY
0.5000 mg/kg | PREFILLED_SYRINGE | INTRAMUSCULAR | Status: DC
  Administered 2022-05-03 – 2022-05-04 (×2): 60 mg via SUBCUTANEOUS
  Filled 2022-05-03 (×2): qty 0.6

## 2022-05-03 MED ORDER — FUROSEMIDE 10 MG/ML IJ SOLN
40.0000 mg | Freq: Two times a day (BID) | INTRAMUSCULAR | Status: DC
  Administered 2022-05-03 – 2022-05-04 (×3): 40 mg via INTRAVENOUS
  Filled 2022-05-03 (×3): qty 4

## 2022-05-03 MED ORDER — AMLODIPINE BESYLATE 10 MG PO TABS
10.0000 mg | ORAL_TABLET | Freq: Every day | ORAL | Status: DC
  Administered 2022-05-03 – 2022-05-04 (×2): 10 mg via ORAL
  Filled 2022-05-03: qty 1
  Filled 2022-05-03: qty 2

## 2022-05-03 MED ORDER — POLYETHYLENE GLYCOL 3350 17 G PO PACK
17.0000 g | PACK | Freq: Two times a day (BID) | ORAL | Status: DC
Start: 2022-05-03 — End: 2022-05-04
  Administered 2022-05-03 (×2): 17 g via ORAL
  Filled 2022-05-03 (×3): qty 1

## 2022-05-03 MED ORDER — SACUBITRIL-VALSARTAN 49-51 MG PO TABS
1.0000 | ORAL_TABLET | Freq: Two times a day (BID) | ORAL | Status: DC
  Administered 2022-05-03 – 2022-05-04 (×3): 1 via ORAL
  Filled 2022-05-03 (×3): qty 1

## 2022-05-03 MED ORDER — LEVETIRACETAM 500 MG PO TABS
500.0000 mg | ORAL_TABLET | Freq: Two times a day (BID) | ORAL | Status: DC
  Administered 2022-05-03 – 2022-05-04 (×3): 500 mg via ORAL
  Filled 2022-05-03 (×3): qty 1

## 2022-05-03 MED ORDER — MELATONIN 5 MG PO TABS
5.0000 mg | ORAL_TABLET | Freq: Every day | ORAL | Status: DC
  Administered 2022-05-03: 5 mg via ORAL
  Filled 2022-05-03: qty 1

## 2022-05-03 MED ORDER — ACETAMINOPHEN 325 MG PO TABS
650.0000 mg | ORAL_TABLET | Freq: Four times a day (QID) | ORAL | Status: DC | PRN
  Administered 2022-05-03: 650 mg via ORAL
  Filled 2022-05-03: qty 2

## 2022-05-03 MED ORDER — METRONIDAZOLE 500 MG/100ML IV SOLN
500.0000 mg | Freq: Two times a day (BID) | INTRAVENOUS | Status: DC
  Administered 2022-05-03: 500 mg via INTRAVENOUS

## 2022-05-03 MED ORDER — SODIUM CHLORIDE 0.9 % IV SOLN
685.0000 mg | Freq: Three times a day (TID) | INTRAVENOUS | Status: DC
Start: 2022-05-03 — End: 2022-05-03

## 2022-05-03 MED ORDER — ACETAMINOPHEN 650 MG RE SUPP: 650.0000 mg | Freq: Four times a day (QID) | RECTAL | Status: DC | PRN

## 2022-05-03 MED ORDER — DEXTROSE 5 % IV SOLN
10.0000 mg/kg | Freq: Once | INTRAVENOUS | Status: AC
  Administered 2022-05-03: 685 mg via INTRAVENOUS
  Filled 2022-05-03: qty 13.7

## 2022-05-03 MED ORDER — SENNOSIDES-DOCUSATE SODIUM 8.6-50 MG PO TABS
2.0000 | ORAL_TABLET | Freq: Two times a day (BID) | ORAL | Status: DC
Start: 2022-05-03 — End: 2022-05-04
  Administered 2022-05-03 – 2022-05-04 (×3): 2 via ORAL
  Filled 2022-05-03 (×3): qty 2

## 2022-05-03 MED ORDER — DIVALPROEX SODIUM ER 500 MG PO TB24
500.0000 mg | ORAL_TABLET | Freq: Two times a day (BID) | ORAL | Status: DC
  Administered 2022-05-03 – 2022-05-04 (×3): 500 mg via ORAL
  Filled 2022-05-03: qty 1
  Filled 2022-05-03: qty 2
  Filled 2022-05-03: qty 1

## 2022-05-03 MED ORDER — LORAZEPAM 2 MG/ML IJ SOLN
1.0000 mg | Freq: Once | INTRAMUSCULAR | Status: AC
Start: 2022-05-03 — End: 2022-05-03
  Administered 2022-05-03: 1 mg via INTRAVENOUS
  Filled 2022-05-03: qty 1

## 2022-05-03 MED ORDER — TRAZODONE HCL 50 MG PO TABS: 25.0000 mg | ORAL_TABLET | Freq: Every evening | ORAL | Status: DC | PRN

## 2022-05-03 MED ORDER — SODIUM CHLORIDE 0.9 % IV SOLN: INTRAVENOUS | Status: DC

## 2022-05-03 MED ORDER — SODIUM CHLORIDE 1 G PO TABS
1.0000 g | ORAL_TABLET | Freq: Three times a day (TID) | ORAL | Status: DC
  Administered 2022-05-03 – 2022-05-04 (×6): 1 g via ORAL
  Filled 2022-05-03 (×6): qty 1

## 2022-05-03 MED ORDER — PREGABALIN 75 MG PO CAPS
75.0000 mg | ORAL_CAPSULE | Freq: Two times a day (BID) | ORAL | Status: DC
  Administered 2022-05-03 – 2022-05-04 (×3): 75 mg via ORAL
  Filled 2022-05-03 (×3): qty 1

## 2022-05-03 NOTE — Consult Note (Signed)
[facility] Face-to-Face Psychiatry Consult   Reason for Consult:Psychiatric Evaluation  Referring Physician: Dr. 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 I just don't know."    Jim Dunn is a 56 y.o. male patient presented to [facility] ED via POV voluntary.Per the ED triage nurses note, Pt coming from group home on [facility] via EMS. Pt was found outside having a seizure, lasted for one minute. No other seizure activity on route. Ems reports pt takes Dilantin and BP meds, has not taken BP meds today. Pt reports still feeling foggy w/ trouble understand information at this time The patient recently had a prolonged 12-month admission and was discharged at the beginning of this month. He was admitted for chronic hyponatremia, altered mental status, and seizures.  Hyponatremia was due to presumed psychogenic polydipsia. He was started on sodium tabs and was fluid-restricted.  This provider saw the patient face-to-face; the chart was reviewed, and consulted with Dr. Marisa Severin on 05/02/2022 due to the patient's care. It was discussed with the EDP that the patient does not meet the criteria to be admitted to the psychiatric inpatient unit.  On evaluation, the patient is alert and oriented x 4, calm, cooperative, and mood-congruent with affect. The patient does  not appear to be responding to internal or external stimuli. Neither is the patient presenting with any delusional thinking. The patient denies auditory or visual hallucinations. The patient admits to passive suicidal ideation but denies homicidal or self-harm ideations. The patient is not presenting with any psychotic or paranoid behaviors. During an encounter with the patient, he could answer questions appropriately.  HPI: Per Dr. Marisa Severin, Jim Dunn is a 56 y.o. male with a history of paranoid schizophrenia, psychogenic polydipsia, hypertension, chronic systolic heart failure, seizure disorder, and COPD who presents for psychiatric evaluation.  The patient was seen in the ED earlier today after an apparent seizure.  He had a work-up performed and was cleared for discharge around midday.  He went back to the group home, but then returned to the ED and stated "to be honest I didn't have a seizure, I just have bad thoughts I can't get out of my head."  The patient describes to me that he is having thoughts where he hears people he knows saying bad things about him including that he is "nothing."  He denies suicidal or homicidal ideation.  He denies seeing or hearing things that are not there.  He states he has been on his medication and denies drug use.  Past Psychiatric History:  Paranoid schizophrenia (HCC) Seizures (HCC)  Risk to Self:   Risk to Others:   Prior Inpatient Therapy:   Prior Outpatient Therapy:    Past Medical History:  Past Medical History:  Diagnosis Date   CHF (congestive heart failure) (HCC)    COPD (chronic obstructive pulmonary disease) (HCC)    History of kidney stones    Hyponatremia  Paranoid schizophrenia (HCC)    Seizures (HCC)    Tobacco abuse     Past Surgical History:  Procedure Laterality Date   KIDNEY STONE SURGERY     MULTIPLE EXTRACTIONS WITH ALVEOLOPLASTY Bilateral 04/29/2020   Procedure: Extraction of tooth #'s 2-13, 17,18, 21-25, and 27-31 with  alveoloplasty and bilateral lingual exostoses reductions.;  Surgeon: [name], DDS;  Location: [facility] OR;  Service: Oral Surgery;  Laterality: Bilateral;   Family History:  Family History  Problem Relation Age of Onset   Heart disease Other    Family Psychiatric  History:  Social History:  Social History   Substance and Sexual Activity  Alcohol Use Not Currently     Social History   Substance and Sexual Activity  Drug Use Never    Social History   Socioeconomic History   Marital status: Single    Spouse name: Not on file   Number of children: Not on file   Years of education: Not on file   Highest education level: Not on file  Occupational History   Occupation: Disability  Tobacco Use   Smoking status: Every Day    Packs/day: 1.00    Types: Cigarettes   Smokeless tobacco: Current    Types: Chew   Tobacco comments:    2-3 cigarettes smoked daily 07/22/20 [facility]   Vaping Use   Vaping Use: Never used  Substance and Sexual Activity   Alcohol use: Not Currently   Drug use: Never   Sexual activity: Not Currently  Other Topics Concern   Not on file  Social History Narrative   Pt lives [facility].  He receives outpatient psychiatric services through the ALF.  He stated that prior to moving there, he did not have a psychiatrist.     Social Determinants of Health   Financial Resource Strain: Not on file  Food Insecurity: Not on file  Transportation Needs: Not on file  Physical Activity: Not on file  Stress: Not on file  Social Connections: Not on file   Additional Social History:    Allergies:  No Known Allergies  Labs:  Results for orders placed or performed during the hospital encounter of 05/02/22 (from the past 48 hour(s))  Urine Drug Screen, Qualitative     Status: Abnormal   Collection Time: 05/02/22  2:27 PM  Result Value Ref Range   Tricyclic, Ur Screen NONE DETECTED NONE DETECTED   Amphetamines, Ur Screen NONE DETECTED NONE DETECTED   MDMA  (Ecstasy)Ur Screen NONE DETECTED NONE DETECTED   Cocaine Metabolite,Ur [place] POSITIVE (A) NONE DETECTED   Opiate, Ur Screen NONE DETECTED NONE DETECTED   Phencyclidine (PCP) Ur S NONE DETECTED NONE DETECTED   Cannabinoid 50 Ng, Ur [place] POSITIVE (A) NONE DETECTED   Barbiturates, Ur Screen NONE DETECTED NONE DETECTED   Benzodiazepine, Ur Scrn POSITIVE (A) NONE DETECTED   Methadone Scn, Ur NONE DETECTED NONE DETECTED    Comment: (NOTE) Tricyclics + metabolites, urine    Cutoff 1000 ng/mL Amphetamines + metabolites, urine  Cutoff 1000 ng/mL MDMA (Ecstasy), urine              Cutoff 500 ng/mL Cocaine Metabolite, urine          Cutoff 300 ng/mL Opiate + metabolites, urine        Cutoff 300 ng/mL Phencyclidine (PCP), urine         Cutoff 25 ng/mL Cannabinoid, urine  Cutoff 50 ng/mL Barbiturates + metabolites, urine  Cutoff 200 ng/mL Benzodiazepine, urine              Cutoff 200 ng/mL Methadone, urine                   Cutoff 300 ng/mL  The urine drug screen provides only a preliminary, unconfirmed analytical test result and should not be used for non-medical purposes. Clinical consideration and professional judgment should be applied to any positive drug screen result due to possible interfering substances. A more specific alternate chemical method must be used in order to obtain a confirmed analytical result. Gas chromatography / mass spectrometry (GC/MS) is the preferred confirm atory method. Performed at [facility], [address] [place], [place] [address]   Resp Panel by RT-PCR (Flu A&B, Covid) Anterior Nasal Swab     Status: None   Collection Time: 05/02/22  4:30 PM   Specimen: Anterior Nasal Swab  Result Value Ref Range   SARS Coronavirus 2 by RT PCR NEGATIVE NEGATIVE    Comment: (NOTE) SARS-CoV-2 target nucleic acids are NOT DETECTED.  The SARS-CoV-2 RNA is generally detectable in upper respiratory specimens during the acute phase of infection. The  lowest concentration of SARS-CoV-2 viral copies this assay can detect is 138 copies/mL. A negative result does not preclude SARS-Cov-2 infection and should not be used as the sole basis for treatment or other patient management decisions. A negative result may occur with  improper specimen collection/handling, submission of specimen other than nasopharyngeal swab, presence of viral mutation(s) within the areas targeted by this assay, and inadequate number of viral copies(<138 copies/mL). A negative result must be combined with clinical observations, patient history, and epidemiological information. The expected result is Negative.  Fact Sheet for Patients:  BloggerCourse.com  Fact Sheet for Healthcare Providers:  SeriousBroker.it  This test is no t yet approved or cleared by the [place] FDA and  has been authorized for detection and/or diagnosis of SARS-CoV-2 by FDA under an Emergency Use Authorization (EUA). This EUA will remain  in effect (meaning this test can be used) for the duration of the COVID-19 declaration under Section 564(b)(1) of the Act, 21 U.S.C.section 360bbb-3(b)(1), unless the authorization is terminated  or revoked sooner.       Influenza A by PCR NEGATIVE NEGATIVE   Influenza B by PCR NEGATIVE NEGATIVE    Comment: (NOTE) The Xpert Xpress SARS-CoV-2/FLU/RSV plus assay is intended as an aid in the diagnosis of influenza from Nasopharyngeal swab specimens and should not be used as a sole basis for treatment. Nasal washings and aspirates are unacceptable for Xpert Xpress SARS-CoV-2/FLU/RSV testing.  Fact Sheet for Patients: BloggerCourse.com  Fact Sheet for Healthcare Providers: SeriousBroker.it  This test is not yet approved or cleared by the [place] FDA and has been authorized for detection and/or diagnosis of SARS-CoV-2 by FDA under an Emergency  Use Authorization (EUA). This EUA will remain in effect (meaning this test can be used) for the duration of the COVID-19 declaration under Section 564(b)(1) of the Act, 21 U.S.C. section 360bbb-3(b)(1), unless the authorization is terminated or revoked.  Performed at [facility], [address] [place], [place] [address]   Acetaminophen level     Status: Abnormal   Collection Time: 05/02/22  4:49 PM  Result Value Ref Range   Acetaminophen (Tylenol), Serum <10 (L) 10 - 30 ug/mL    Comment: (NOTE) Therapeutic concentrations vary significantly. A range of 10-30 ug/mL  may be an effective concentration for many patients. However, some  are best treated at concentrations outside of this range. Acetaminophen concentrations >150 ug/mL at 4 hours after ingestion  and >50 ug/mL at 12 hours after ingestion are often associated with  toxic reactions.  Performed at [facility], [address] [place], [place] [address]   Ethanol     Status: None   Collection Time: 05/02/22  4:49 PM  Result Value Ref Range   Alcohol, Ethyl (B) <10 <10 mg/dL    Comment: (NOTE) Lowest detectable limit for serum alcohol is 10 mg/dL.  For medical purposes only. Performed at [facility], [address] [place], [place] [address]   Salicylate level     Status: Abnormal   Collection Time: 05/02/22  4:49 PM  Result Value Ref Range   Salicylate Lvl <7.0 (L) 7.0 - 30.0 mg/dL    Comment: Performed at [facility], [address] [place], [place] [address]  CBG monitoring, ED     Status: None   Collection Time: 05/02/22 11:13 PM  Result Value Ref Range   Glucose-Capillary 86 70 - 99 mg/dL    Comment: Glucose reference range applies only to samples taken after fasting for at least 8 hours.  CBC with Differential/Platelet     Status: Abnormal   Collection Time: 05/02/22 11:32 PM  Result Value Ref Range   WBC 11.2 (H) 4.0 - 10.5 K/uL   RBC 6.13 (H) 4.22 - 5.81  MIL/uL   Hemoglobin 13.6 13.0 - 17.0 g/dL   HCT 91.4 78.2 - 95.6 %   MCV 69.3 (L) 80.0 - 100.0 fL   MCH 22.2 (L) 26.0 - 34.0 pg   MCHC 32.0 30.0 - 36.0 g/dL   RDW 21.3 (H) 08.6 - 57.8 %   Platelets 219 150 - 400 K/uL   nRBC 0.0 0.0 - 0.2 %   Neutrophils Relative % 49 %   Neutro Abs 5.6 1.7 - 7.7 K/uL   Lymphocytes Relative 37 %   Lymphs Abs 4.1 (H) 0.7 - 4.0 K/uL   Monocytes Relative 12 %   Monocytes Absolute 1.3 (H) 0.1 - 1.0 K/uL   Eosinophils Relative 1 %   Eosinophils Absolute 0.1 0.0 - 0.5 K/uL   Basophils Relative 0 %   Basophils Absolute 0.1 0.0 - 0.1 K/uL   WBC Morphology MORPHOLOGY UNREMARKABLE    RBC Morphology MORPHOLOGY UNREMARKABLE    Smear Review Normal platelet morphology    Immature Granulocytes 1 %   Abs Immature Granulocytes 0.06 0.00 - 0.07 K/uL    Comment: Performed at [facility], [address] [place], [place] [address]  Comprehensive metabolic panel     Status: Abnormal   Collection Time: 05/02/22 11:32 PM  Result Value Ref Range   Sodium 135 135 - 145 mmol/L   Potassium 3.4 (L) 3.5 - 5.1 mmol/L   Chloride 100 98 - 111 mmol/L   CO2 21 (L) 22 - 32 mmol/L   Glucose, Bld 95 70 - 99 mg/dL    Comment: Glucose reference range applies only to samples taken after fasting for at least 8 hours.   BUN 10 6 - 20 mg/dL   Creatinine, Ser 9.52 0.61 - 1.24 mg/dL   Calcium 9.2 8.9 - 84.1 mg/dL   Total Protein 8.0 6.5 - 8.1 g/dL   Albumin 4.3 3.5 - 5.0 g/dL   AST 46 (H) 15 - 41 U/L   ALT 14 0 - 44 U/L  Alkaline Phosphatase 64 38 - 126 U/L   Total Bilirubin 0.6 0.3 - 1.2 mg/dL   GFR, Estimated >62 >13 mL/min    Comment: (NOTE) Calculated using the CKD-EPI Creatinine Equation (2021)    Anion gap 14 5 - 15    Comment: Performed at [facility], [address] [place], [place] [address]  Valproic acid level     Status: None   Collection Time: 05/02/22 11:32 PM  Result Value Ref Range   Valproic Acid Lvl 88 50.0 - 100.0 ug/mL    Comment:  Performed at [facility], [address] [place], [place] [address]  Lactic acid, plasma     Status: Abnormal   Collection Time: 05/02/22 11:33 PM  Result Value Ref Range   Lactic Acid, Venous 5.8 (HH) 0.5 - 1.9 mmol/L    Comment: CRITICAL RESULT CALLED TO, READ BACK BY AND VERIFIED WITH [email or web address]  04/3022 RH Performed at [facility], [address] [place], [place] [address]   Protime-INR     Status: None   Collection Time: 05/02/22 11:56 PM  Result Value Ref Range   Prothrombin Time 13.6 11.4 - 15.2 seconds   INR 1.1 0.8 - 1.2    Comment: (NOTE) INR goal varies based on device and disease states. Performed at [facility], [address] [place], [place] [address]   SARS Coronavirus 2 by RT PCR (hospital order, performed in [facility] hospital lab) *cepheid single result test* Anterior Nasal Swab     Status: None   Collection Time: 05/03/22  1:03 AM   Specimen: Anterior Nasal Swab  Result Value Ref Range   SARS Coronavirus 2 by RT PCR NEGATIVE NEGATIVE    Comment: (NOTE) SARS-CoV-2 target nucleic acids are NOT DETECTED.  The SARS-CoV-2 RNA is generally detectable in upper and lower respiratory specimens during the acute phase of infection. The lowest concentration of SARS-CoV-2 viral copies this assay can detect is 250 copies / mL. A negative result does not preclude SARS-CoV-2 infection and should not be used as the sole basis for treatment or other patient management decisions.  A negative result may occur with improper specimen collection / handling, submission of specimen other than nasopharyngeal swab, presence of viral mutation(s) within the areas targeted by this assay, and inadequate number of viral copies (<250 copies / mL). A negative result must be combined with clinical observations, patient history, and epidemiological information.  Fact Sheet for Patients:   RoadLapTop.co.za  Fact Sheet  for Healthcare Providers: http://kim-miller.com/  This test is not yet approved or  cleared by the [place] FDA and has been authorized for detection and/or diagnosis of SARS-CoV-2 by FDA under an Emergency Use Authorization (EUA).  This EUA will remain in effect (meaning this test can be used) for the duration of the COVID-19 declaration under Section 564(b)(1) of the Act, 21 U.S.C. section 360bbb-3(b)(1), unless the authorization is terminated or revoked sooner.  Performed at [facility], [address] [place], [place] [address]     Current Facility-Administered Medications  Medication Dose Route Frequency Provider Last Rate Last Admin   0.9 %  sodium chloride infusion   Intravenous Continuous Ward, Kristen N, DO       acyclovir (ZOVIRAX) 685 mg in dextrose 5 % 100 mL IVPB  10 mg/kg (Ideal) Intravenous Once Ward, Kristen N, DO       sodium chloride 0.9 % bolus 1,000 mL  1,000 mL Intravenous Once Ward, Layla Maw, DO  sodium chloride 0.9 % bolus 1,000 mL  1,000 mL Intravenous Once Ward, Kristen N, DO 2,000 mL/hr at 05/03/22 0207 1,000 mL at 05/03/22 0207   vancomycin (VANCOREADY) IVPB 1500 mg/300 mL  1,500 mg Intravenous Once Ward, Kristen N, DO 150 mL/hr at 05/03/22 0207 1,500 mg at 05/03/22 0207   Current Outpatient Medications  Medication Sig Dispense Refill   acetaminophen (TYLENOL) 325 MG tablet Take 2 tablets (650 mg total) by mouth every 4 (four) hours as needed for mild pain or fever. (Patient not taking: Reported on 09/11/2021)     ALPRAZolam (XANAX) 1 MG tablet Take 1 tablet (1 mg total) by mouth 2 (two) times daily. 60 tablet 0   amLODipine (NORVASC) 10 MG tablet Take 1 tablet (10 mg total) by mouth daily. 30 tablet 0   atorvastatin (LIPITOR) 40 MG tablet Take 1 tablet (40 mg total) by mouth daily. 30 tablet 0   busPIRone (BUSPAR) 10 MG tablet Take 1 tablet (10 mg total) by mouth 3 (three) times daily. 90 tablet 0   carvedilol (COREG)  25 MG tablet Take 1 tablet (25 mg total) by mouth 2 (two) times daily with a meal. 60 tablet 0   digoxin (LANOXIN) 0.25 MG tablet Take 1 tablet (0.25 mg total) by mouth daily. 30 tablet 0   divalproex (DEPAKOTE ER) 500 MG 24 hr tablet Take 1 tablet (500 mg total) by mouth 2 (two) times daily. 60 tablet 0   melatonin 3 MG TABS tablet Take 1 tablet (3 mg total) by mouth at bedtime. 30 tablet 0   mirtazapine (REMERON) 15 MG tablet Take 1 tablet (15 mg total) by mouth at bedtime. 30 tablet 0   paliperidone (INVEGA) 6 MG 24 hr tablet Take 1 tablet (6 mg total) by mouth daily. 30 tablet 0   polyethylene glycol powder (GLYCOLAX/MIRALAX) 17 GM/SCOOP powder Dissolve 17 gm in liquid and take by mouth 2 (two) times daily. 476 g 0   pregabalin (LYRICA) 75 MG capsule Take 1 capsule (75 mg total) by mouth 2 (two) times daily. 60 capsule 0   QUEtiapine (SEROQUEL) 50 MG tablet Take 1 tablet (50 mg total) by mouth at bedtime. 30 tablet 0   sacubitril-valsartan (ENTRESTO) 49-51 MG Take 1 tablet by mouth 2 (two) times daily. 60 tablet 0   senna-docusate (SENOKOT-S) 8.6-50 MG tablet Take 2 tablets by mouth 2 (two) times daily. 120 tablet 0   sodium chloride 1 g tablet Take 1 tablet (1 g total) by mouth 3 (three) times daily with meals. 90 tablet 0    Musculoskeletal: Strength & Muscle Tone: within normal limits Gait & Station: normal Patient leans: N/A  Psychiatric Specialty Exam:  Presentation  General Appearance: Appropriate for Environment  Eye Contact:Good  Speech:Clear and Coherent  Speech Volume:Normal  Handedness:Right   Mood and Affect  Mood:Euthymic  Affect:Congruent   Thought Process  Thought Processes:Coherent  Descriptions of Associations:Intact  Orientation:Full (Time, Place and Person)  Thought Content:Logical  History of Schizophrenia/Schizoaffective disorder:Yes  Duration of Psychotic Symptoms:Greater than six months  Hallucinations:Hallucinations: None  Ideas of  Reference:None  Suicidal Thoughts:Suicidal Thoughts: Yes, Passive SI Passive Intent and/or Plan: Without Intent; Without Plan; Without Means to Carry Out  Homicidal Thoughts:Homicidal Thoughts: No   Sensorium  Memory:Immediate Fair; Recent Fair; Remote Fair  Judgment:Fair  Insight:Fair   Executive Functions  Concentration:Fair  Attention Span:Fair  Recall:Fair  Fund of Knowledge:Fair  Language:Fair   Psychomotor Activity  Psychomotor Activity:Psychomotor Activity: Normal   Assets  Assets:Communication  Skills; Desire for Improvement; Physical Health; Resilience   Sleep  Sleep:Sleep: Good   Physical Exam: Physical Exam Vitals and nursing note reviewed.  Constitutional:      Appearance: Normal appearance. He is normal weight.  HENT:     Head: Normocephalic and atraumatic.     Right Ear: External ear normal.     Left Ear: External ear normal.     Nose: Nose normal.  Cardiovascular:     Rate and Rhythm: Tachycardia present.  Pulmonary:     Effort: Pulmonary effort is normal.  Musculoskeletal:        General: Normal range of motion.     Cervical back: Normal range of motion and neck supple.  Neurological:     General: No focal deficit present.     Mental Status: He is alert and oriented to person, place, and time.  Psychiatric:        Attention and Perception: Attention and perception normal.        Mood and Affect: Mood normal. Affect is inappropriate.        Speech: Speech is tangential.        Behavior: Behavior normal. Behavior is cooperative.        Thought Content: Thought content includes suicidal ideation.        Cognition and Memory: Cognition and memory normal.        Judgment: Judgment is inappropriate.   Review of Systems  Psychiatric/Behavioral:  Positive for depression.   All other systems reviewed and are negative.  Blood pressure (!) 167/99, pulse (!) 104, temperature (!) 101.5 F (38.6 C), temperature source Rectal, resp. rate 20,  height 5\' 8"  (1.727 m), weight 122.4 kg, SpO2 95 %. Body mass index is 41.03 kg/m.  Treatment Plan Summary: Plan Patient does not meet criteria for psychiatric inpatient admission  Disposition: No evidence of imminent risk to self or others at present.   Patient does not meet criteria for psychiatric inpatient admission. Supportive therapy provided about ongoing stressors. Discussed crisis plan, support from social network, calling 911, coming to the Emergency Department, and calling Suicide Hotline.  , NP 05/03/2022 2:09 AM

## 2022-05-03 NOTE — ED Notes (Addendum)
Spoke with pts legal guardian Orion Modest [phone] about pt being admitted medically at this time. Legal guardian agreeable that pt should be admitted.

## 2022-05-03 NOTE — ED Notes (Signed)
Spoke with Terri Skains (group home owner) [phone] in regards to patient's care and pt's status of admission at this time.

## 2022-05-03 NOTE — Progress Notes (Signed)
Anticoagulation monitoring(Lovenox):  56 yo male ordered Lovenox 40 mg Q24h    Filed Weights   05/02/22 1422  Weight: 122.4 kg (269 lb 13.5 oz)   BMI 41   Lab Results  Component Value Date   CREATININE 0.88 05/02/2022   CREATININE 0.80 05/02/2022   CREATININE 0.77 04/06/2022   Estimated Creatinine Clearance: 122.2 mL/min (by C-G formula based on SCr of 0.88 mg/dL). Hemoglobin & Hematocrit     Component Value Date/Time   HGB 13.6 05/02/2022 2332   HCT 42.5 05/02/2022 2332   HCT 36.0 (L) 03/11/2020 7989     Per Protocol for Patient with estCrcl > 30 ml/min and BMI > 30, will transition to Lovenox 60 mg Q24h.

## 2022-05-03 NOTE — Assessment & Plan Note (Signed)
-   The patient be admitted to a medical telemetry bed. - He will be placed on seizure precaution. - EEG and neurology consult to be obtained. - We will continue on Keppra. - We will obtain a brain MRI especially given his left facial numbness. - He will be placed on anticoagulation aspirin for now.

## 2022-05-03 NOTE — Assessment & Plan Note (Addendum)
-   The patient will be gently diuresed with IV Lasix. - We will continue Entresto and Coreg as well as digoxin.

## 2022-05-03 NOTE — BH Assessment (Signed)
Comprehensive Clinical Assessment (CCA) Note  05/03/2022 Jim Dunn [id number]  Chief Complaint: Patient is a 56 year old male presenting to [facility] ED voluntarily. Per triage note Pt was brought here by [facility]. Pt states in front of myself and Jim Dunn, tech " To be honest I didn't have a seizure, I just have bad thoughts I cant get out of my head they are all lies." Pt again states he has bad thought because of the neighborhood he grew up in. Patient presented to this ED earlier in the day due to reporting seizure symptoms, he returned to the ED to report that he now has "bad thoughts." During assessment patient appears alert and oriented x4, calm and cooperative. Patient reports "my bain is messed up." Patient reports some depression symptoms but denies SI and denies a current plan. Patient reports that he was previously in "[facility] for 7 months" but that was due to physical health needs and placing the patient in a group home, patient is now currently in a group home and reports that he likes his group home. Patient denies SI/HI.  Per Psyc NP Jim Dunn patient is psyc cleared Chief Complaint  Patient presents with   Psychiatric Evaluation   Visit Diagnosis: Schizophrenia    CCA Screening, Triage and Referral (STR)  Patient Reported Information How did you hear about [place]? Self  Referral name: No data recorded Referral phone number: No data recorded  Whom do you see for routine medical problems? No data recorded Practice/Facility Name: No data recorded Practice/Facility Phone Number: No data recorded Name of Contact: No data recorded Contact Number: No data recorded Contact Fax Number: No data recorded Prescriber Name: No data recorded Prescriber Address (if known): No data recorded  What Is the Reason for Your Visit/Call Today? Pt was brought here by [facility]. Pt states in front of myself and Jim Dunn, tech " To be honest I didn't have a seizure, I just have bad thoughts I  cant get out of my head they are all lies." Pt again states he has bad thought because of the neighborhood he grew up in.  How Long Has This Been Causing You Problems? > than 6 months  What Do You Feel Would Help You the Most Today? Treatment for Depression or other mood problem; Housing Assistance; Social Support; Medication(s)   Have You Recently Been in Any Inpatient Treatment (Hospital/Detox/Crisis Center/28-Day Program)? No data recorded Name/Location of Program/Hospital:No data recorded How Long Were You There? No data recorded When Were You Discharged? No data recorded  Have You Ever Received Services From [facility] Before? No data recorded Who Do You See at [facility]? No data recorded  Have You Recently Had Any Thoughts About Hurting Yourself? No  Are You Planning to Commit Suicide/Harm Yourself At This time? No   Have you Recently Had Thoughts About Hurting Someone [name]? No  Explanation: No data recorded  Have You Used Any Alcohol or Drugs in the Past 24 Hours? No  How Long Ago Did You Use Drugs or Alcohol? No data recorded What Did You Use and How Much? No data recorded  Do You Currently Have a Therapist/Psychiatrist? -- (Unknown)  Name of Therapist/Psychiatrist: Dr Rico Ala   Have You Been Recently Discharged From Any Office Practice or Programs? No  Explanation of Discharge From Practice/Program: No data recorded    CCA Screening Triage Referral Assessment Type of Contact: Face-to-Face  Is this Initial or Reassessment? Initial Assessment  Date Telepsych consult ordered in  [facility]:  09/11/21  Time Telepsych consult ordered in [facility]:  1530   Patient Reported Information Reviewed? No data recorded Patient Left Without Being Seen? No data recorded Reason for Not Completing Assessment: No data recorded  Collateral Involvement: Pt's sister: [name] [phone]   Does Patient Have a Court Appointed Legal Guardian? No data recorded Name and Contact of  Legal Guardian: No data recorded If Minor and Not Living with Parent(s), Who has Custody? n/a  Is CPS involved or ever been involved? Never  Is APS involved or ever been involved? Never   Patient Determined To Be At Risk for Harm To Self or Others Based on Review of Patient Reported Information or Presenting Complaint? No  Method: No data recorded Availability of Means: No data recorded Intent: No data recorded Notification Required: No data recorded Additional Information for Danger to Others Potential: No data recorded Additional Comments for Danger to Others Potential: No data recorded Are There Guns or Other Weapons in Your Home? No data recorded Types of Guns/Weapons: No data recorded Are These Weapons Safely Secured?                            No data recorded Who Could Verify You Are Able To Have These Secured: No data recorded Do You Have any Outstanding Charges, Pending Court Dates, Parole/Probation? No data recorded Contacted To Inform of Risk of Harm To Self or Others: Unable to Contact:   Location of Assessment: [facility] ED   Does Patient Present under Involuntary Commitment? No  IVC Papers Initial File Date: 08/01/21   [place] of Residence: [place]   Patient Currently Receiving the Following Services: Group Home   Determination of Need: Emergent (2 hours)   Options For Referral: Outpatient Therapy; Medication Management     CCA Biopsychosocial Intake/Chief Complaint:  No data recorded Current Symptoms/Problems: No data recorded  Patient Reported Schizophrenia/Schizoaffective Diagnosis in Past: No   Strengths: Pt has family support  Preferences: No data recorded Abilities: No data recorded  Type of Services Patient Feels are Needed: No data recorded  Initial Clinical Notes/Concerns: No data recorded  Mental Health Symptoms Depression:   Change in energy/activity; Hopelessness   Duration of Depressive symptoms:  Greater than two weeks    Mania:   None   Anxiety:    None   Psychosis:   Delusions   Duration of Psychotic symptoms:  Greater than six months   Trauma:   None   Obsessions:   None   Compulsions:   None   Inattention:   N/A   Hyperactivity/Impulsivity:   N/A   Oppositional/Defiant Behaviors:   N/A   Emotional Irregularity:   Transient, stress-related paranoia/disassociation   Other Mood/Personality Symptoms:   None    Mental Status Exam Appearance and self-care  Stature:   Average   Weight:   Average weight   Clothing:   -- (Scrubs)   Grooming:   Normal   Cosmetic use:   None   Posture/gait:   Normal   Motor activity:   Not Remarkable   Sensorium  Attention:   Normal   Concentration:   Variable   Orientation:   Object; Person; Place; Situation; Time   Recall/memory:   Defective in Remote   Affect and Mood  Affect:   Blunted; Depressed   Mood:   Depressed   Relating  Eye contact:   Normal   Facial expression:   Responsive   Attitude  toward examiner:   Cooperative   Thought and Language  Speech flow:  Paucity; Normal   Thought content:   Persecutions   Preoccupation:   Ruminations; Other (Comment) (Has been interested in fire)   Hallucinations:   None   Organization:  No data recorded  [employer] of Knowledge:   Fair   Intelligence:   Average   Abstraction:   Concrete   Judgement:   Poor   Reality Testing:   Distorted   Insight:   Lacking   Decision Making:   Only simple   Social Functioning  Social Maturity:   Isolates   Social Judgement:   Victimized   Stress  Stressors:   Other (Comment) (Pt denies stressors)   Coping Ability:   Deficient supports   Skill Deficits:   Decision making; Self-care; Responsibility   Supports:   Support needed     Religion: Religion/Spirituality Are You A Religious Person?: Yes How Might This Affect Treatment?: UTA  Leisure/Recreation: Leisure /  Recreation Do You Have Hobbies?: No  Exercise/Diet: Exercise/Diet Do You Exercise?: No Have You Gained or Lost A Significant Amount of Weight in the Past Six Months?: No Do You Follow a Special Diet?: No Do You Have Any Trouble Sleeping?: Yes   CCA Employment/Education Employment/Work Situation: Employment / Work Situation Employment Situation: On disability Why is Patient on Disability: Mental Health How Long has Patient Been on Disability: Unknown Patient's Job has Been Impacted by Current Illness: No Has Patient ever Been in the [employer]?: No  Education: Education Is Patient Currently Attending School?: No Did You Have An Individualized Education Program (IIEP): No Did You Have Any Difficulty At School?: No Patient's Education Has Been Impacted by Current Illness: No   CCA Family/Childhood History Family and Relationship History: Family history Marital status: Single Does patient have children?:  (Unknown)  Childhood History:  Childhood History By whom was/is the patient raised?: Grandparents Did patient suffer any verbal/emotional/physical/sexual abuse as a child?: No Did patient suffer from severe childhood neglect?: No Has patient ever been sexually abused/assaulted/raped as an adolescent or adult?: No Was the patient ever a victim of a crime or a disaster?: No Witnessed domestic violence?: No Has patient been affected by domestic violence as an adult?: No  Child/Adolescent Assessment:     CCA Substance Use Alcohol/Drug Use: Alcohol / Drug Use Pain Medications: See MAR Prescriptions: See MAR Over the Counter: See MAR History of alcohol / drug use?: No history of alcohol / drug abuse                         ASAM's:  Six Dimensions of Multidimensional Assessment  Dimension 1:  Acute Intoxication and/or Withdrawal Potential:      Dimension 2:  Biomedical Conditions and Complications:      Dimension 3:  Emotional, Behavioral, or Cognitive  Conditions and Complications:     Dimension 4:  Readiness to Change:     Dimension 5:  Relapse, Continued use, or Continued Problem Potential:     Dimension 6:  Recovery/Living Environment:     ASAM Severity Score:    ASAM Recommended Level of Treatment:     Substance use Disorder (SUD)    Recommendations for Services/Supports/Treatments:    DSM5 Diagnoses: Patient Active Problem List   Diagnosis Date Noted   Acute on chronic systolic CHF (congestive heart failure) (HCC) 05/03/2022   Essential hypertension 05/03/2022   Anxiety 05/03/2022   Dyslipidemia 05/03/2022  Sepsis due to undetermined organism (HCC) 05/03/2022   Obesity (BMI 30-39.9) 02/07/2022   Hyponatremia    Schizophrenia (HCC)    Peripheral neuropathy 10/20/2021   Dementia without behavioral disturbance (HCC) 09/25/2021   Needs assistance with community resources 09/12/2021   Disorganized schizophrenia (HCC)    Dementia (HCC) 06/08/2020   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   Chronic post-traumatic stress disorder (PTSD)    Noncompliance with medications    Subtherapeutic serum dilantin level    Seizure (HCC)    Hypomagnesemia 02/23/2020   Suicidal ideation 02/23/2020   Paranoid schizophrenia with hallucinations    Hypokalemia    Chronic systolic CHF (congestive heart failure) (HCC) 01/22/2020   Mixed hyperlipidemia 01/22/2020   COPD (chronic obstructive pulmonary disease) (HCC) 01/22/2020   Microcytic anemia 01/22/2020   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

## 2022-05-03 NOTE — Progress Notes (Signed)
Eeg done 

## 2022-05-03 NOTE — Plan of Care (Signed)
  Problem: Education: Goal: Knowledge of General Education information will improve Description: Including pain rating scale, medication(s)/side effects and non-pharmacologic comfort measures Outcome: Progressing   Problem: Health Behavior/Discharge Planning: Goal: Ability to manage health-related needs will improve Outcome: Progressing   Problem: Clinical Measurements: Goal: Ability to maintain clinical measurements within normal limits will improve Outcome: Progressing Goal: Will remain free from infection Outcome: Progressing Goal: Diagnostic test results will improve Outcome: Progressing Goal: Respiratory complications will improve Outcome: Progressing Goal: Cardiovascular complication will be avoided Outcome: Progressing   Problem: Activity: Goal: Risk for activity intolerance will decrease Outcome: Progressing   Problem: Nutrition: Goal: Adequate nutrition will be maintained Outcome: Progressing   Problem: Coping: Goal: Level of anxiety will decrease Outcome: Progressing   Problem: Elimination: Goal: Will not experience complications related to bowel motility Outcome: Progressing Goal: Will not experience complications related to urinary retention Outcome: Progressing   Problem: Pain Managment: Goal: General experience of comfort will improve Outcome: Progressing   Problem: Safety: Goal: Ability to remain free from injury will improve Outcome: Progressing   Problem: Skin Integrity: Goal: Risk for impaired skin integrity will decrease Outcome: Progressing   Problem: Fluid Volume: Goal: Hemodynamic stability will improve Outcome: Progressing   Problem: Clinical Measurements: Goal: Diagnostic test results will improve Outcome: Progressing Goal: Signs and symptoms of infection will decrease Outcome: Progressing   Problem: Respiratory: Goal: Ability to maintain adequate ventilation will improve Outcome: Progressing   Problem: Education: Goal:  Expressions of having a comfortable level of knowledge regarding the disease process will increase Outcome: Progressing   Problem: Coping: Goal: Ability to adjust to condition or change in health will improve Outcome: Progressing Goal: Ability to identify appropriate support needs will improve Outcome: Progressing   Problem: Health Behavior/Discharge Planning: Goal: Compliance with prescribed medication regimen will improve Outcome: Progressing   Problem: Medication: Goal: Risk for medication side effects will decrease Outcome: Progressing   Problem: Clinical Measurements: Goal: Complications related to the disease process, condition or treatment will be avoided or minimized Outcome: Progressing Goal: Diagnostic test results will improve Outcome: Progressing   Problem: Safety: Goal: Verbalization of understanding the information provided will improve Outcome: Progressing   Problem: Self-Concept: Goal: Level of anxiety will decrease Outcome: Progressing Goal: Ability to verbalize feelings about condition will improve Outcome: Progressing   

## 2022-05-03 NOTE — Assessment & Plan Note (Signed)
-   We will continue BuSpar and Xanax.

## 2022-05-03 NOTE — Consult Note (Signed)
NAME: Jim Dunn  DOB: 1965-11-05  MRN: [id number]  Date/Time: 05/03/2022 11:44 AM  REQUESTING PROVIDER: Dr.Amery Subjective:  REASON FOR CONSULT: meningitis/encephalitis ? Jim Dunn is a 56 y.o. 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 He apparently was found to have another seizure when he got out of the cab  and was brought back 2 hrs later at 2.19 pm. He claimed that he did not have seizure but just bad thoughts in his head . They were plannign to admit him to behavioral unit Vitals at 2.40pm (!) 173/103 BP      Pulse Rate 05/02/22 1421 97     Resp 05/02/22 1421 17     Temp 05/02/22 1421 98.2 F (36.8 C)     Temp Source 05/02/22 1421 Oral     SpO2 05/02/22 1421 97 %    At 11pm he had witnessed seizures and had a temp of 101.5 2300  05/02/22 23:04  BP 157/119 (H)  Temp 99.8 F (37.7 C)  Pulse Rate 102 !  Resp 26 !  SpO2 97 %   Labs revealed  Latest Reference Range & Units 05/02/22 23:32 05/03/22  WBC 4.0 - 10.5 K/uL 11.2 (H) 10.7 (H)  Hemoglobin 13.0 - 17.0 g/dL 22.0 25.4 (L)  HCT 27.0 - 52.0 % 42.5 37.5 (L)  Platelets 150 - 400 K/uL 219 226  Creatinine 0.61 - 1.24 mg/dL 6.23 7.62   CT head- no acute abnormalities-  Large, stable right posterior parietal arachnoid cyst. He underwent LP and it showed 2 wbc and normal protein and glucose. He was started on broad spectrum antibiotic coverage including acyclovir I am asked to see the patient. Pt is now awake and having a conversation He said he was in [facility] for 7 months ( 09/24/21-04/07/22) for low sodium due to drinking more water. He says he uses cocaine   Past Medical History:  Diagnosis Date   CHF (congestive heart failure) (HCC)    COPD (chronic obstructive pulmonary  disease) (HCC)    History of kidney stones    Hyponatremia    Paranoid schizophrenia (HCC)    Seizures (HCC)    Tobacco abuse     Past Surgical History:  Procedure Laterality Date   KIDNEY STONE SURGERY     MULTIPLE EXTRACTIONS WITH ALVEOLOPLASTY Bilateral 04/29/2020   Procedure: Extraction of tooth #'s 2-13, 17,18, 21-25, and 27-31 with alveoloplasty and bilateral lingual exostoses reductions.;  Surgeon: [name], DDS;  Location: [facility] OR;  Service: Oral Surgery;  Laterality: Bilateral;    Social History   Socioeconomic History   Marital status: Single    Spouse name: Not on file   Number of children: Not on file   Years of education: Not on file   Highest education level: Not on file  Occupational History   Occupation: Disability  Tobacco Use   Smoking status: Every Day    Packs/day: 1.00    Types: Cigarettes   Smokeless tobacco: Current    Types: Chew   Tobacco comments:    2-3 cigarettes smoked daily 07/22/20 [facility]   Vaping Use   Vaping Use: Never used  Substance and Sexual Activity   Alcohol use: Not Currently   Drug use: Never   Sexual  activity: Not Currently  Other Topics Concern   Not on file  Social History Narrative   Pt lives [facility].  He receives outpatient psychiatric services through the ALF.  He stated that prior to moving there, he did not have a psychiatrist.     Social Determinants of Health   Financial Resource Strain: Not on file  Food Insecurity: Not on file  Transportation Needs: Not on file  Physical Activity: Not on file  Stress: Not on file  Social Connections: Not on file  Intimate Partner Violence: Not on file    Family History  Problem Relation Age of Onset   Heart disease Other    No Known Allergies I? Current Facility-Administered Medications  Medication Dose Route Frequency Provider Last Rate Last Admin   acetaminophen (TYLENOL) tablet 650 mg  650 mg Oral Q6H PRN Mansy, Jan A, MD   650 mg at 05/03/22 0439   Or    acetaminophen (TYLENOL) suppository 650 mg  650 mg Rectal Q6H PRN Mansy, Vernetta Honey, MD       ALPRAZolam Prudy Feeler) tablet 1 mg  1 mg Oral BID Mansy, Jan A, MD   1 mg at 05/03/22 1120   amLODipine (NORVASC) tablet 10 mg  10 mg Oral Daily Mansy, Jan A, MD   10 mg at 05/03/22 1120   atorvastatin (LIPITOR) tablet 40 mg  40 mg Oral Daily Mansy, Jan A, MD   40 mg at 05/03/22 1121   busPIRone (BUSPAR) tablet 10 mg  10 mg Oral TID Mansy, Jan A, MD   10 mg at 05/03/22 1121   carvedilol (COREG) tablet 25 mg  25 mg Oral BID WC Mansy, Jan A, MD   25 mg at 05/03/22 9323   digoxin (LANOXIN) tablet 0.25 mg  0.25 mg Oral Daily Mansy, Jan A, MD   0.25 mg at 05/03/22 1121   divalproex (DEPAKOTE ER) 24 hr tablet 500 mg  500 mg Oral BID Mansy, Jan A, MD   500 mg at 05/03/22 1121   enoxaparin (LOVENOX) injection 60 mg  0.5 mg/kg Subcutaneous Q24H Mansy, Jan A, MD   60 mg at 05/03/22 0737   furosemide (LASIX) injection 40 mg  40 mg Intravenous BID Mansy, Jan A, MD   40 mg at 05/03/22 0741   levETIRAcetam (KEPPRA) tablet 500 mg  500 mg Oral BID Mansy, Jan A, MD   500 mg at 05/03/22 1120   magnesium hydroxide (MILK OF MAGNESIA) suspension 30 mL  30 mL Oral Daily PRN Mansy, Jan A, MD       melatonin tablet 5 mg  5 mg Oral QHS Mansy, Jan A, MD       midazolam PF (VERSED) injection 10 mg  10 mg Intravenous Once PRN Jefferson Fuel, MD       mirtazapine (REMERON) tablet 15 mg  15 mg Oral QHS Mansy, Jan A, MD       ondansetron [facility]) tablet 4 mg  4 mg Oral Q6H PRN Mansy, Jan A, MD       Or   ondansetron [facility]) injection 4 mg  4 mg Intravenous Q6H PRN Mansy, Jan A, MD       paliperidone (INVEGA) 24 hr tablet 6 mg  6 mg Oral Daily Mansy, Jan A, MD   6 mg at 05/03/22 1120   polyethylene glycol (MIRALAX / GLYCOLAX) packet 17 g  17 g Oral BID Mansy, Jan A, MD   17 g at 05/03/22 1120  pregabalin (LYRICA) capsule 75 mg  75 mg Oral BID Mansy, Jan A, MD   75 mg at 05/03/22 1120   QUEtiapine (SEROQUEL) tablet 50 mg  50 mg Oral QHS  Mansy, Jan A, MD       sacubitril-valsartan (ENTRESTO) 49-51 mg per tablet  1 tablet Oral BID Mansy, Jan A, MD   1 tablet at 05/03/22 1121   senna-docusate (Senokot-S) tablet 2 tablet  2 tablet Oral BID Mansy, Jan A, MD   2 tablet at 05/03/22 1120   sodium chloride tablet 1 g  1 g Oral TID WC Mansy, Jan A, MD   1 g at 05/03/22 1120   traZODone (DESYREL) tablet 25 mg  25 mg Oral QHS PRN Mansy, Jan A, MD       vancomycin (VANCOREADY) IVPB 1250 mg/250 mL  1,250 mg Intravenous Q12H Mansy, Vernetta Honey, MD       Current Outpatient Medications  Medication Sig Dispense Refill   ALPRAZolam (XANAX) 1 MG tablet Take 1 tablet (1 mg total) by mouth 2 (two) times daily. 60 tablet 0   divalproex (DEPAKOTE ER) 500 MG 24 hr tablet Take 1 tablet (500 mg total) by mouth 2 (two) times daily. 60 tablet 0   paliperidone (INVEGA) 6 MG 24 hr tablet Take 1 tablet (6 mg total) by mouth daily. 30 tablet 0   pregabalin (LYRICA) 75 MG capsule Take 1 capsule (75 mg total) by mouth 2 (two) times daily. 60 capsule 0   acetaminophen (TYLENOL) 325 MG tablet Take 2 tablets (650 mg total) by mouth every 4 (four) hours as needed for mild pain or fever. (Patient not taking: Reported on 09/11/2021)     amLODipine (NORVASC) 10 MG tablet Take 1 tablet (10 mg total) by mouth daily. 30 tablet 0   atorvastatin (LIPITOR) 40 MG tablet Take 1 tablet (40 mg total) by mouth daily. 30 tablet 0   busPIRone (BUSPAR) 10 MG tablet Take 1 tablet (10 mg total) by mouth 3 (three) times daily. 90 tablet 0   carvedilol (COREG) 25 MG tablet Take 1 tablet (25 mg total) by mouth 2 (two) times daily with a meal. 60 tablet 0   digoxin (LANOXIN) 0.25 MG tablet Take 1 tablet (0.25 mg total) by mouth daily. 30 tablet 0   melatonin 3 MG TABS tablet Take 1 tablet (3 mg total) by mouth at bedtime. 30 tablet 0   mirtazapine (REMERON) 15 MG tablet Take 1 tablet (15 mg total) by mouth at bedtime. 30 tablet 0   polyethylene glycol powder (GLYCOLAX/MIRALAX) 17 GM/SCOOP powder  Dissolve 17 gm in liquid and take by mouth 2 (two) times daily. 476 g 0   QUEtiapine (SEROQUEL) 50 MG tablet Take 1 tablet (50 mg total) by mouth at bedtime. 30 tablet 0   sacubitril-valsartan (ENTRESTO) 49-51 MG Take 1 tablet by mouth 2 (two) times daily. 60 tablet 0   senna-docusate (SENOKOT-S) 8.6-50 MG tablet Take 2 tablets by mouth 2 (two) times daily. 120 tablet 0   sodium chloride 1 g tablet Take 1 tablet (1 g total) by mouth 3 (three) times daily with meals. 90 tablet 0     Abtx:  Anti-infectives (From admission, onward)    Start     Dose/Rate Route Frequency Ordered Stop   05/03/22 1300  vancomycin (VANCOREADY) IVPB 1250 mg/250 mL        1,250 mg 166.7 mL/hr over 90 Minutes Intravenous Every 12 hours 05/03/22 0345     05/03/22 1230  acyclovir (ZOVIRAX) 685 mg in dextrose 5 % 100 mL IVPB  Status:  Discontinued        10 mg/kg  68.4 kg (Ideal) 113.7 mL/hr over 60 Minutes Intravenous Every 8 hours 05/03/22 0549 05/03/22 0914   05/03/22 0900  ceFEPIme (MAXIPIME) 2 g in sodium chloride 0.9 % 100 mL IVPB  Status:  Discontinued        2 g 200 mL/hr over 30 Minutes Intravenous Every 8 hours 05/03/22 0332 05/03/22 0909   05/03/22 0545  acyclovir (ZOVIRAX) NICU IV Syringe 5 mg/mL  Status:  Discontinued        1,000 mg 200 mL/hr over 60 Minutes Intravenous Every 8 hours 05/03/22 0538 05/03/22 0543   05/03/22 0545  acyclovir (ZOVIRAX) NICU IV Syringe 5 mg/mL  Status:  Discontinued        685 mg 137 mL/hr over 60 Minutes Intravenous Every 8 hours 05/03/22 0539 05/03/22 0548   05/03/22 0545  acyclovir (ZOVIRAX) NICU IV Syringe 5 mg/mL  Status:  Discontinued        685 mg 137 mL/hr over 60 Minutes Intravenous Every 8 hours 05/03/22 0544 05/03/22 0548   05/03/22 0345  metroNIDAZOLE (FLAGYL) IVPB 500 mg  Status:  Discontinued        500 mg 100 mL/hr over 60 Minutes Intravenous Every 12 hours 05/03/22 0332 05/03/22 0909   05/03/22 0345  vancomycin (VANCOCIN) IVPB 1000 mg/200 mL premix   Status:  Discontinued        1,000 mg 200 mL/hr over 60 Minutes Intravenous  Once 05/03/22 0332 05/03/22 0336   05/03/22 0045  acyclovir (ZOVIRAX) 685 mg in dextrose 5 % 100 mL IVPB        10 mg/kg  68.4 kg (Ideal) 113.7 mL/hr over 60 Minutes Intravenous Once 05/03/22 0042 05/03/22 0618   05/03/22 0015  ceFEPIme (MAXIPIME) 2 g in sodium chloride 0.9 % 100 mL IVPB        2 g 200 mL/hr over 30 Minutes Intravenous  Once 05/03/22 0005 05/03/22 0052   05/03/22 0015  vancomycin (VANCOCIN) IVPB 1000 mg/200 mL premix       See Hyperspace for full Linked Orders Report.   1,000 mg 200 mL/hr over 60 Minutes Intravenous  Once 05/03/22 0006 05/03/22 0326   05/03/22 0015  vancomycin (VANCOREADY) IVPB 1500 mg/300 mL       See Hyperspace for full Linked Orders Report.   1,500 mg 150 mL/hr over 120 Minutes Intravenous  Once 05/03/22 0006 05/03/22 0444       REVIEW OF SYSTEMS:  Const: negative fever, negative chills, negative weight loss Eyes: negative diplopia or visual changes, negative eye pain ENT: negative coryza, negative sore throat Resp: negative cough, hemoptysis, dyspnea Cards: negative for chest pain, palpitations, lower extremity edema GU: negative for frequency, dysuria and hematuria GI: Negative for abdominal pain, diarrhea, bleeding, constipation Skin: negative for rash and pruritus Heme: negative for easy bruising and gum/nose bleeding MS: has some body aches Neurolo:seizures Psych: Hearing voices  Endocrine: negative for thyroid, diabetes Allergy/Immunology- negative for any medication or food allergies ?  Objective:  VITALS:  BP 112/66   Pulse 69   Temp 98.8 F (37.1 C) (Oral)   Resp 18   Ht 5\' 8"  (1.727 m)   Wt 122.4 kg   SpO2 96%   BMI 41.03 kg/m   PHYSICAL EXAM:  General: was sleeping, but easily woke up on calling his name and was oriented to self, place,year, month- followed commands  appropriately [place] Spaniel he was hungry Head: Normocephalic, without obvious  abnormality, atraumatic. Eyes: Conjunctivae clear, anicteric sclerae. Pupils are equal ENT Nares normal. No drainage or sinus tenderness. Lips, mucosa, and tongue normal. No Thrush Neck: Supple, symmetrical, no adenopathy, thyroid: non tender no carotid bruit and no JVD. Back: No CVA tenderness. Lungs: Clear to auscultation bilaterally. No Wheezing or Rhonchi. No rales. Heart: Regular rate and rhythm, no murmur, rub or gallop. Abdomen: Soft, non-tender,not distended. Bowel sounds normal. No masses Extremities: scabbing linear wounds b/l lower extremities- does not look infected Skin: No rashes or lesions. Or bruising Lymph: Cervical, supraclavicular normal. Neurologic: Grossly non-focal Pertinent Labs Lab Results CBC    Component Value Date/Time   WBC 10.7 (H) 05/03/2022 0435   RBC 5.47 05/03/2022 0435   HGB 12.1 (L) 05/03/2022 0435   HCT 37.5 (L) 05/03/2022 0435   HCT 36.0 (L) 03/11/2020 0433   PLT 226 05/03/2022 0435   MCV 68.6 (L) 05/03/2022 0435   MCH 22.1 (L) 05/03/2022 0435   MCHC 32.3 05/03/2022 0435   RDW 16.1 (H) 05/03/2022 0435   LYMPHSABS 4.1 (H) 05/02/2022 2332   MONOABS 1.3 (H) 05/02/2022 2332   EOSABS 0.1 05/02/2022 2332   BASOSABS 0.1 05/02/2022 2332       Latest Ref Rng & Units 05/03/2022    4:35 AM 05/02/2022   11:32 PM 05/02/2022    8:07 AM  CMP  Glucose 70 - 99 mg/dL 94  95  983   BUN 6 - 20 mg/dL 9  10  8    Creatinine 0.61 - 1.24 mg/dL  3.82  5.05   Sodium 135 - 145 mmol/L 134  135  135   Potassium 3.5 - 5.1 mmol/L 3.1  3.4  3.9   Chloride 98 - 111 mmol/L 105  100  103   CO2 22 - 32 mmol/L 22  21  18    Calcium 8.9 - 10.3 mg/dL 8.1  9.2  9.3   Total Protein 6.5 - 8.1 g/dL  8.0  8.2   Total Bilirubin 0.3 - 1.2 mg/dL  0.6  0.9   Alkaline Phos 38 - 126 U/L  64  67   AST 15 - 41 U/L  46  47   ALT 0 - 44 U/L  14  11     Latest Reference Range & Units 05/03/22 02:00  Appearance, CSF CLEAR  CLEAR  COLORLESS ! COLORLESS !  Glucose, CSF 40 - 70  mg/dL 64  RBC Count, CSF 0 - 3 /cu mm 0 - 3 /cu mm 315 (H) 65 (H)  WBC, CSF 0 - 5 /cu mm 0 - 5 /cu mm 2 0  Segmented Neutrophils-CSF % 53  Lymphs, CSF % 47  Monocyte-Macrophage-Spinal Fluid % 0  Eosinophils, CSF % 0  Color, CSF COLORLESS  COLORLESS  CLEAR ! (C) CLEAR !  Supernatant  COLORLESS COLORLESS  Total  Protein, CSF 15 - 45 mg/dL 40    Microbiology: Recent Results (from the past 240 hour(s))  Resp Panel by RT-PCR (Flu A&B, Covid) Anterior Nasal Swab     Status: None   Collection Time: 05/02/22  4:30 PM   Specimen: Anterior Nasal Swab  Result Value Ref Range Status   SARS Coronavirus 2 by RT PCR NEGATIVE NEGATIVE Final    Comment: (NOTE) SARS-CoV-2 target nucleic acids are NOT DETECTED.  The SARS-CoV-2 RNA is generally detectable in upper respiratory specimens during the acute phase of infection. The lowest concentration of SARS-CoV-2 viral  copies this assay can detect is 138 copies/mL. A negative result does not preclude SARS-Cov-2 infection and should not be used as the sole basis for treatment or other patient management decisions. A negative result may occur with  improper specimen collection/handling, submission of specimen other than nasopharyngeal swab, presence of viral mutation(s) within the areas targeted by this assay, and inadequate number of viral copies(<138 copies/mL). A negative result must be combined with clinical observations, patient history, and epidemiological information. The expected result is Negative.  Fact Sheet for Patients:  BloggerCourse.com  Fact Sheet for Healthcare Providers:  SeriousBroker.it  This test is no t yet approved or cleared by the [place] FDA and  has been authorized for detection and/or diagnosis of SARS-CoV-2 by FDA under an Emergency Use Authorization (EUA). This EUA will remain  in effect (meaning this test can be used) for the duration of the COVID-19  declaration under Section 564(b)(1) of the Act, 21 U.S.C.section 360bbb-3(b)(1), unless the authorization is terminated  or revoked sooner.       Influenza A by PCR NEGATIVE NEGATIVE Final   Influenza B by PCR NEGATIVE NEGATIVE Final    Comment: (NOTE) The Xpert Xpress SARS-CoV-2/FLU/RSV plus assay is intended as an aid in the diagnosis of influenza from Nasopharyngeal swab specimens and should not be used as a sole basis for treatment. Nasal washings and aspirates are unacceptable for Xpert Xpress SARS-CoV-2/FLU/RSV testing.  Fact Sheet for Patients: BloggerCourse.com  Fact Sheet for Healthcare Providers: SeriousBroker.it  This test is not yet approved or cleared by the [place] FDA and has been authorized for detection and/or diagnosis of SARS-CoV-2 by FDA under an Emergency Use Authorization (EUA). This EUA will remain in effect (meaning this test can be used) for the duration of the COVID-19 declaration under Section 564(b)(1) of the Act, 21 U.S.C. section 360bbb-3(b)(1), unless the authorization is terminated or revoked.  Performed at [facility], [address] [place], [place] [address]   Blood culture (routine x 2)     Status: None (Preliminary result)   Collection Time: 05/02/22 11:57 PM   Specimen: BLOOD  Result Value Ref Range Status   Specimen Description BLOOD RIGHT UPPER ARM  Final   Special Requests   Final    BOTTLES DRAWN AEROBIC AND ANAEROBIC Blood Culture adequate volume   Culture   Final    NO GROWTH < 12 HOURS Performed at [facility], [address], [place], [place] [address]    Report Status PENDING  Incomplete  Blood culture (routine x 2)     Status: None (Preliminary result)   Collection Time: 05/02/22 11:57 PM   Specimen: BLOOD  Result Value Ref Range Status   Specimen Description BLOOD RIGHT [facility]  Final   Special Requests   Final    BOTTLES DRAWN AEROBIC AND  ANAEROBIC Blood Culture adequate volume   Culture   Final    NO GROWTH < 12 HOURS Performed at [facility], [address] [place], [place] [address]    Report Status PENDING  Incomplete  SARS Coronavirus 2 by RT PCR (hospital order, performed in [facility] hospital lab) *cepheid single result test* Anterior Nasal Swab     Status: None   Collection Time: 05/03/22  1:03 AM   Specimen: Anterior Nasal Swab  Result Value Ref Range Status   SARS Coronavirus 2 by RT PCR NEGATIVE NEGATIVE Final    Comment: (NOTE) SARS-CoV-2 target nucleic acids are NOT DETECTED.  The SARS-CoV-2  RNA is generally detectable in upper and lower respiratory specimens during the acute phase of infection. The lowest concentration of SARS-CoV-2 viral copies this assay can detect is 250 copies / mL. A negative result does not preclude SARS-CoV-2 infection and should not be used as the sole basis for treatment or other patient management decisions.  A negative result may occur with improper specimen collection / handling, submission of specimen other than nasopharyngeal swab, presence of viral mutation(s) within the areas targeted by this assay, and inadequate number of viral copies (<250 copies / mL). A negative result must be combined with clinical observations, patient history, and epidemiological information.  Fact Sheet for Patients:   RoadLapTop.co.za  Fact Sheet for Healthcare Providers: http://kim-miller.com/  This test is not yet approved or  cleared by the [place] FDA and has been authorized for detection and/or diagnosis of SARS-CoV-2 by FDA under an Emergency Use Authorization (EUA).  This EUA will remain in effect (meaning this test can be used) for the duration of the COVID-19 declaration under Section 564(b)(1) of the Act, 21 U.S.C. section 360bbb-3(b)(1), unless the authorization is terminated or revoked sooner.  Performed at  [facility], [address] [place], [place] [address]   CSF culture w Gram Stain     Status: None (Preliminary result)   Collection Time: 05/03/22  2:00 AM   Specimen: CSF; Cerebrospinal Fluid  Result Value Ref Range Status   Specimen Description CSF  Final   Special Requests NONE  Final   Gram Stain   Final    NO ORGANISMS SEEN WBC SEEN RED BLOOD CELLS PRESENT Performed at [facility], [address] [place], [place] [address]    Culture PENDING  Incomplete   Report Status PENDING  Incomplete    IMAGING RESULTS:  I have personally reviewed the films ?EKG- sinus rhythm with prolonged PR  Impression/Recommendation Seizures- breakthru Cocaine use H/o hyponatermia from psychogenic polydipsia but Na okay  No evidence of meninigits or encephalitis clincially and csf- so can Discontinue all antibiotics   Schizophrenia  HTN? on amlodipine   CHF- entresto, carvedilol, frusemide   ?Substance use disorder ___________________________________________________ Discussed with patient, requesting provider and nurse Note:  This document was prepared using Dragon voice recognition software and may include unintentional dictation errors.

## 2022-05-03 NOTE — Assessment & Plan Note (Signed)
-   We will continue statin therapy. 

## 2022-05-03 NOTE — Progress Notes (Signed)
This is a no charge noticed patient was admitted this AM.  Patient was seen and examined.  H&P reviewed.   Jim Dunn is a 56 y.o. African-American male with medical history significant for COPD, CHF, paranoid schizophrenia, seizure disorder and tobacco abuse, who presented to the ER with acute onset of seizures was tonic-clonic and lasted about 30 seconds.  The patient was apparently cleared for discharge from our behavioral health unit and was waiting for a ride before this happened.  He was having sonorous respirations and urinary as well as stool incontinence as well as postictal confusion.  He denied any chest pain or palpitations.     He will be admitted to a medical telemetry unit bed for further evaluation and management.   Pt on my exam did not respond, or open his eyes with sternal rub.   Sleeping, eyes closed.no response to my questions/sternal rub Anteriorly cta.  Reg s1/s2  Benign soft  A/P ID consulted Hold abx as it may decrease seizure threshold and no evidence of source of infection. S/p LP

## 2022-05-03 NOTE — Progress Notes (Signed)
PHARMACY -  BRIEF ANTIBIOTIC NOTE   Pharmacy has received consult(s) for Acyclovir from an ED provider.  The patient's profile has been reviewed for ht/wt/allergies/indication/available labs.    One time order(s) placed for Acyclovir 685 mg (10 mg/kg IBW) IV X 1 ordered.   Further antibiotics/pharmacy consults should be ordered by admitting physician if indicated.                       Thank you, Robbie Nangle D 05/03/2022  12:43 AM

## 2022-05-03 NOTE — ED Notes (Addendum)
Writer received call from locked unit tech, Paige to inform that additional help is needed due to pt having seizure like activity. Per nurse tech, Idalia Needle, pt was lying in bed, Paige heard loud "thud" noise, looked up and seen pt lying next to mattress on floor. Paige and Security reported they entered locked area to see pts arms and legs our stretched, stiff and in jerking-like activity with foam at corners of mouth. Per Idalia Needle, she assisted rolling pt over and called for assistance. Writer, MDs and other staff to area to assist and bring pt back to room 24 for continued assessment. Pt post-ictal with snoring RR but able to respond to staff when spoken to.

## 2022-05-03 NOTE — Progress Notes (Signed)
PHARMACY -  BRIEF ANTIBIOTIC NOTE   Pharmacy has received consult(s) for Vancomycin , Cefepime from an ED provider.  The patient's profile has been reviewed for ht/wt/allergies/indication/available labs.    One time order(s) placed for Cefepime 2 gm IV X 1 and Vancomycin 2500 mg.   Further antibiotics/pharmacy consults should be ordered by admitting physician if indicated.                       Thank you, Miroslava Santellan D 05/03/2022  12:06 AM

## 2022-05-03 NOTE — ED Notes (Signed)
Informed RN bed assigned 

## 2022-05-03 NOTE — Progress Notes (Addendum)
CSW was notified by hospitalist NP that patient was present in the [facility] ED.  CSW met with patient at bedside to discuss recent events that brought him to the hospital. Patient reports he had a seizure at his group home and was brought in by EMS. Patient was disoriented and confused during conversation. Patient unable to answer orientation questions correctly. Patient stated he did know not how he tested positive for cocaine because he had not knowingly ingested any substances. Patient denied any safety concerns at the group home.  Patient has a legal guardian - Jim Dunn of [place] [phone]). Jim Dunn should be contacted to be provided with updates and for all decision making.  CSW spoke with Jim Dunn to discuss patient and the plan for a medical admission. CSW and Claude Manges will communicate further to determine an appropriate disposition plan.  Madilyn Fireman, MSW, LCSW Transitions of Care  Clinical Social Worker II [phone]

## 2022-05-03 NOTE — H&P (Signed)
[place]   PATIENT NAME: Jim Dunn    MR#:  [id number]  DATE OF BIRTH:  1966/08/21  DATE OF ADMISSION:  05/02/2022  PRIMARY CARE PHYSICIAN: Patient, No Pcp Per   Patient is coming from: Group home  REQUESTING/REFERRING PHYSICIAN: Ward, Layla Maw, DO  CHIEF COMPLAINT:   Chief Complaint  Patient presents with   Psychiatric Evaluation    HISTORY OF PRESENT ILLNESS:  Kaleel Schmieder is a 56 y.o. African-American male with medical history significant for COPD, CHF, paranoid schizophrenia, seizure disorder and tobacco abuse, who presented to the ER with acute onset of seizures was tonic-clonic and lasted about 30 seconds.  The patient was apparently cleared for discharge from our behavioral health unit and was waiting for a ride before this happened.  He was having sonorous respirations and urinary as well as stool incontinence as well as postictal confusion.  He denied any chest pain or palpitations.  No cough or wheezing or dyspnea.  No dysuria, oliguria or hematuria or flank pain.  No focal muscle weakness.  He was having mild left facial numbness with no dysphagia or dysarthria.  ED Course: When him to the ER he was noted to be febrile with temperature of 101.5 and BP was 167/105 heart rate 108 and respiratory rate 26 with pulse currently 96% on room air.  Labs revealed hypokalemia of 3.4 with AST of 46 and otherwise unremarkable CMP.  Lactic acid was 5.8 and procalcitonin less than 0.1.  CBC showed leukocytosis of 11.2.  PT and INR were normal.  Blood cultures were drawn.  UA showed 100 protein, 5 ketones and was otherwise unremarkable. EKG as reviewed by me : EKG showed normal sinus rhythm rate of 98 with prolonged PR interval, left atrial enlargement, right axis deviation. Imaging: Portable chest x-ray showed changes consistent with CHF with no focal pneumonia seen.  The patient was given IV vancomycin, cefepime and acyclovir as well as 3 L bolus of IV normal saline 525 mill per  hour, 1 g of p.o. Tylenol and was loaded with 2 g of IV Keppra as well as 1 mg of IV Ativan.  He had an LP and CSF showed 65, 315 RBCs, 0, 2 WBCs and 40 protein with 64 glucose.  He will be admitted to a medical telemetry unit bed for further evaluation and management.  PAST MEDICAL HISTORY:   Past Medical History:  Diagnosis Date   CHF (congestive heart failure) (HCC)    COPD (chronic obstructive pulmonary disease) (HCC)    History of kidney stones    Hyponatremia    Paranoid schizophrenia (HCC)    Seizures (HCC)    Tobacco abuse     PAST SURGICAL HISTORY:   Past Surgical History:  Procedure Laterality Date   KIDNEY STONE SURGERY     MULTIPLE EXTRACTIONS WITH ALVEOLOPLASTY Bilateral 04/29/2020   Procedure: Extraction of tooth #'s 2-13, 17,18, 21-25, and 27-31 with alveoloplasty and bilateral lingual exostoses reductions.;  Surgeon: [name], DDS;  Location: [facility] OR;  Service: Oral Surgery;  Laterality: Bilateral;    SOCIAL HISTORY:   Social History   Tobacco Use   Smoking status: Every Day    Packs/day: 1.00    Types: Cigarettes   Smokeless tobacco: Current    Types: Chew   Tobacco comments:    2-3 cigarettes smoked daily 07/22/20 [facility]   Substance Use Topics   Alcohol use: Not Currently    FAMILY HISTORY:   Family History  Problem Relation Age of Onset   Heart disease Other     DRUG ALLERGIES:  No Known Allergies  REVIEW OF SYSTEMS:   ROS As per history of present illness. All pertinent systems were reviewed above. Constitutional, HEENT, cardiovascular, respiratory, GI, GU, musculoskeletal, neuro, psychiatric, endocrine, integumentary and hematologic systems were reviewed and are otherwise negative/unremarkable except for positive findings mentioned above in the HPI.   MEDICATIONS AT HOME:   Prior to Admission medications   Medication Sig Start Date End Date Taking? Authorizing Provider  acetaminophen (TYLENOL) 325 MG tablet Take 2 tablets (650 mg  total) by mouth every 4 (four) hours as needed for mild pain or fever. Patient not taking: Reported on 09/11/2021 06/07/20   Russella Dar, NP  ALPRAZolam Prudy Feeler) 1 MG tablet Take 1 tablet (1 mg total) by mouth 2 (two) times daily. 04/06/22   Tyrone Nine, MD  amLODipine (NORVASC) 10 MG tablet Take 1 tablet (10 mg total) by mouth daily. 04/06/22   Tyrone Nine, MD  atorvastatin (LIPITOR) 40 MG tablet Take 1 tablet (40 mg total) by mouth daily. 04/06/22   Tyrone Nine, MD  busPIRone (BUSPAR) 10 MG tablet Take 1 tablet (10 mg total) by mouth 3 (three) times daily. 04/06/22   Tyrone Nine, MD  carvedilol (COREG) 25 MG tablet Take 1 tablet (25 mg total) by mouth 2 (two) times daily with a meal. 04/06/22   Tyrone Nine, MD  digoxin (LANOXIN) 0.25 MG tablet Take 1 tablet (0.25 mg total) by mouth daily. 04/06/22   Tyrone Nine, MD  divalproex (DEPAKOTE ER) 500 MG 24 hr tablet Take 1 tablet (500 mg total) by mouth 2 (two) times daily. 04/06/22   Tyrone Nine, MD  melatonin 3 MG TABS tablet Take 1 tablet (3 mg total) by mouth at bedtime. 04/06/22   Tyrone Nine, MD  mirtazapine (REMERON) 15 MG tablet Take 1 tablet (15 mg total) by mouth at bedtime. 04/06/22   Tyrone Nine, MD  paliperidone (INVEGA) 6 MG 24 hr tablet Take 1 tablet (6 mg total) by mouth daily. 04/06/22   Tyrone Nine, MD  polyethylene glycol powder (GLYCOLAX/MIRALAX) 17 GM/SCOOP powder Dissolve 17 gm in liquid and take by mouth 2 (two) times daily. 04/06/22   Tyrone Nine, MD  pregabalin (LYRICA) 75 MG capsule Take 1 capsule (75 mg total) by mouth 2 (two) times daily. 04/06/22   Tyrone Nine, MD  QUEtiapine (SEROQUEL) 50 MG tablet Take 1 tablet (50 mg total) by mouth at bedtime. 04/06/22   Tyrone Nine, MD  sacubitril-valsartan (ENTRESTO) 49-51 MG Take 1 tablet by mouth 2 (two) times daily. 04/06/22   Tyrone Nine, MD  senna-docusate (SENOKOT-S) 8.6-50 MG tablet Take 2 tablets by mouth 2 (two) times daily. 04/06/22   Tyrone Nine, MD  sodium chloride 1 g  tablet Take 1 tablet (1 g total) by mouth 3 (three) times daily with meals. 04/06/22   Tyrone Nine, MD      VITAL SIGNS:  Blood pressure 130/79, pulse 92, temperature (!) 101.7 F (38.7 C), temperature source Oral, resp. rate 20, height 5\' 8"  (1.727 m), weight 122.4 kg, SpO2 96 %.  PHYSICAL EXAMINATION:  Physical Exam  GENERAL:  56 y.o.-year-old African-American male patient lying in the bed with no acute distress.  EYES: Pupils equal, round, reactive to light and accommodation. No scleral icterus. Extraocular muscles intact.  HEENT: Head atraumatic, normocephalic. Oropharynx and nasopharynx  clear.  NECK:  Supple, no jugular venous distention. No thyroid enlargement, no tenderness.  LUNGS: Slightly diminished bibasilar breath sounds with minimal bibasilar rales.  No use of accessory muscles of respiration.  CARDIOVASCULAR: Regular rate and rhythm, S1, S2 normal. No murmurs, rubs, or gallops.  ABDOMEN: Soft, nondistended, nontender. Bowel sounds present. No organomegaly or mass.  EXTREMITIES: Trace bilateral lower extremity pitting edema with no cyanosis, or clubbing.  NEUROLOGIC: Cranial nerves II through XII are intact. Muscle strength 5/5 in all extremities. Sensation intact. Gait not checked.  PSYCHIATRIC: The patient is alert and oriented x 3.  Normal affect and good eye contact. SKIN: No obvious rash, lesion, or ulcer.   LABORATORY PANEL:   CBC Recent Labs  Lab 05/03/22 0435  WBC 10.7*  HGB 12.1*  HCT 37.5*  PLT 226   ------------------------------------------------------------------------------------------------------------------  Chemistries  Recent Labs  Lab 05/02/22 2332 05/03/22 0435  NA 135 134*  K 3.4* 3.1*  CL 100 105  CO2 21* 22  GLUCOSE 95 94  BUN 10 9  CREATININE 0.88 0.70  CALCIUM 9.2 8.1*  AST 46*  --   ALT 14  --   ALKPHOS 64  --   BILITOT 0.6  --     ------------------------------------------------------------------------------------------------------------------  Cardiac Enzymes No results for input(s): "TROPONINI" in the last 168 hours. ------------------------------------------------------------------------------------------------------------------  RADIOLOGY:  DG Chest Portable 1 View  Result Date: 05/03/2022 CLINICAL DATA:  Recent syncopal episode and possible sepsis EXAM: PORTABLE CHEST 1 VIEW COMPARISON:  03/19/2022 FINDINGS: Cardiac shadow is enlarged but stable. Central vascular congestion is noted without significant edema. No focal infiltrate is noted. No bony abnormality is seen. IMPRESSION: Changes consistent with CHF.  No focal pneumonia is seen. Electronically Signed   By: Alcide Clever M.D.   On: 05/03/2022 01:27   CT Cervical Spine Wo Contrast  Result Date: 05/03/2022 CLINICAL DATA:  Psychiatric evaluation. EXAM: CT CERVICAL SPINE WITHOUT CONTRAST TECHNIQUE: Multidetector CT imaging of the cervical spine was performed without intravenous contrast. Multiplanar CT image reconstructions were also generated. RADIATION DOSE REDUCTION: This exam was performed according to the departmental dose-optimization program which includes automated exposure control, adjustment of the mA and/or kV according to patient size and/or use of iterative reconstruction technique. COMPARISON:  February 23, 2020 FINDINGS: Alignment: There is mild reversal of the normal cervical spine lordosis. Skull base and vertebrae: No acute fracture. No primary bone lesion or focal pathologic process. Soft tissues and spinal canal: No prevertebral fluid or swelling. No visible canal hematoma. Disc levels: Marked severity endplate sclerosis and mild to moderate severity anterior osteophyte formation are seen at the levels of C3-C4, C4-C5, C5-C6, C6-C7 and C7-T1. Moderate to marked severity intervertebral disc space narrowing is seen at C3-C4, C4-C5, C5-C6, C6-C7 and C7-T1.  Mild, bilateral multilevel facet joint hypertrophy is noted. Upper chest: Negative. Other: None. IMPRESSION: Marked severity multilevel degenerative changes without evidence of an acute fracture or subluxation. Electronically Signed   By: Aram Candela M.D.   On: 05/03/2022 00:50   CT HEAD WO CONTRAST ( )  Result Date: 05/03/2022 CLINICAL DATA:  Psychiatric evaluation. EXAM: CT HEAD WITHOUT CONTRAST TECHNIQUE: Contiguous axial images were obtained from the base of the skull through the vertex without intravenous contrast. RADIATION DOSE REDUCTION: This exam was performed according to the departmental dose-optimization program which includes automated exposure control, adjustment of the mA and/or kV according to patient size and/or use of iterative reconstruction technique. COMPARISON:  October 31, 2021 FINDINGS: Brain: No evidence of acute infarction,  hemorrhage or hydrocephalus. A large, stable arachnoid cyst is seen within the posterior parietal lobe on the right. Vascular: No hyperdense vessel or unexpected calcification. Skull: Normal. Negative for fracture or focal lesion. Sinuses/Orbits: A 10 mm x 8 mm right maxillary sinus polyp versus mucous retention cyst is noted. Other: None. IMPRESSION: 1. No acute intracranial abnormality. 2. Large, stable right posterior parietal arachnoid cyst. Electronically Signed   By: Aram Candela M.D.   On: 05/03/2022 00:43      IMPRESSION AND PLAN:  Assessment and Plan: * Seizure disorder [facility]) - The patient be admitted to a medical telemetry bed. - He will be placed on seizure precaution. - EEG and neurology consult to be obtained. - We will continue on Keppra. - We will obtain a brain MRI especially given his left facial numbness. - He will be placed on anticoagulation aspirin for now.  Acute on chronic systolic CHF (congestive heart failure) (HCC) - The patient will be gently diuresed with IV Lasix. - We will continue Entresto and Coreg as well  as digoxin.  Sepsis due to undetermined organism (HCC) - We will continue empiric antibiotic therapy with IV vancomycin cefepime and Flagyl. - He had added IV acyclovir pending LP results given his headache. - We will continue it for now pending neurology evaluation for the possibility of underlying encephalitis. - His repeat COVID-19 PCR came back negative.  Hypokalemia - We will place potassium and check magnesium level.  Dyslipidemia We will continue statin therapy.  Anxiety - We will continue BuSpar and Xanax.  Essential hypertension - We will continue his antihypertensives  Schizophrenia (HCC) - We will continue his psychotropic medications.. - Psychiatry consult will be obtained for follow-up       DVT prophylaxis: Lovenox.  Advanced Care Planning:  Code Status: full code.  Family Communication:  The plan of care was discussed in details with the patient (and family). I answered all questions. The patient agreed to proceed with the above mentioned plan. Further management will depend upon hospital course. Disposition Plan: Back to previous home environment Consults called: Neurology and psychiatry All the records are reviewed and case discussed with ED provider.  Status is: Inpatient   At the time of the admission, it appears that the appropriate admission status for this patient is inpatient.  This is judged to be reasonable and necessary in order to provide the required intensity of service to ensure the patient's safety given the presenting symptoms, physical exam findings and initial radiographic and laboratory data in the context of comorbid conditions.  The patient requires inpatient status due to high intensity of service, high risk of further deterioration and high frequency of surveillance required.  I certify that at the time of admission, it is my clinical judgment that the patient will require inpatient hospital care extending more than 2 midnights.                             Dispo: The patient is from: Home              Anticipated d/c is to: Home              Patient currently is not medically stable to d/c.              Difficult to place patient: No  Hannah Beat M.D on 05/03/2022 at 5:24 AM  [facility]   From 7 PM-7 AM, contact night-coverage  [email or web address]  CC: Primary care physician; Patient, No Pcp Per

## 2022-05-03 NOTE — Assessment & Plan Note (Signed)
-   We will continue empiric antibiotic therapy with IV vancomycin cefepime and Flagyl. - He had added IV acyclovir pending LP results given his headache. - We will continue it for now pending neurology evaluation for the possibility of underlying encephalitis. - His repeat COVID-19 PCR came back negative. 

## 2022-05-03 NOTE — Progress Notes (Signed)
Pharmacy Antibiotic Note  Jim Dunn is a 56 y.o. male admitted on 05/02/2022 with sepsis.  Pharmacy has been consulted for Vanc, Cefepime dosing.  Plan: Cefepime 2 gm IV X 1 given in ED on 8/30 @ 0036. Cefepime 2 gm IV Q8H ordered to continue on 8/30 @ 0900.  Vancomycin 1 gm IV X 1 given in ED on 8/30 @ 0057 followed by  Vancomycin 1500 mg IV X 1 to make total loading dose of 2500 mg.  Vancomycin 1250 mg IV Q12H ordered to start on 8/30 @ 1300.  AUC = 501.5 Vanc trough = 13.9   Height: 5\' 8"  (172.7 cm) Weight: 122.4 kg (269 lb 13.5 oz) IBW/kg (Calculated) : 68.4  Temp (24hrs), Avg:99.9 F (37.7 C), Min:98.2 F (36.8 C), Max:101.5 F (38.6 C)  Recent Labs  Lab 05/02/22 0807 05/02/22 2332 05/02/22 2333 05/03/22 0200  WBC 8.6 11.2*  --   --   CREATININE 0.80 0.88  --   --   LATICACIDVEN  --   --  5.8* 1.6    Estimated Creatinine Clearance: 122.2 mL/min (by C-G formula based on SCr of 0.88 mg/dL).    No Known Allergies  Antimicrobials this admission:   >>    >>   Dose adjustments this admission:   Microbiology results:  BCx:   UCx:    Sputum:    MRSA PCR:   Thank you for allowing pharmacy to be a part of this patient's care.  Eleazar Kimmey D 05/03/2022 3:45 AM

## 2022-05-03 NOTE — Assessment & Plan Note (Signed)
-   We will continue empiric antibiotic therapy with IV vancomycin cefepime and Flagyl. - He had added IV acyclovir pending LP results given his headache. - We will continue it for now pending neurology evaluation for the possibility of underlying encephalitis. - His repeat COVID-19 PCR came back negative.

## 2022-05-03 NOTE — Assessment & Plan Note (Signed)
-   We will continue his psychotropic medications.. - Psychiatry consult will be obtained for follow-up

## 2022-05-03 NOTE — Assessment & Plan Note (Signed)
-   We will place potassium and check magnesium level. 

## 2022-05-03 NOTE — Assessment & Plan Note (Signed)
-   We will continue his antihypertensives. 

## 2022-05-04 DIAGNOSIS — G40909 Epilepsy, unspecified, not intractable, without status epilepticus: Secondary | ICD-10-CM | POA: Diagnosis not present

## 2022-05-04 LAB — BASIC METABOLIC PANEL
Anion gap: 8 (ref 5–15)
BUN: 18 mg/dL (ref 6–20)
CO2: 21 mmol/L — ABNORMAL LOW (ref 22–32)
Calcium: 7.9 mg/dL — ABNORMAL LOW (ref 8.9–10.3)
Chloride: 107 mmol/L (ref 98–111)
Creatinine, Ser: 0.76 mg/dL (ref 0.61–1.24)
GFR, Estimated: >60 mL/min (ref >60)
Glucose, Bld: 86 mg/dL (ref 70–99)
Potassium: 3.5 mmol/L (ref 3.5–5.1)
Sodium: 136 mmol/L (ref 135–145)

## 2022-05-04 LAB — URINE CULTURE: Culture: NO GROWTH

## 2022-05-04 LAB — BRAIN NATRIURETIC PEPTIDE: B Natriuretic Peptide: 6.1 pg/mL (ref 0.0–100.0)

## 2022-05-04 LAB — VDRL, CSF: VDRL Quant, CSF: NONREACTIVE

## 2022-05-04 MED ORDER — POTASSIUM CHLORIDE CRYS ER 10 MEQ PO TBCR
10.0000 meq | EXTENDED_RELEASE_TABLET | Freq: Once | ORAL | Status: AC
  Administered 2022-05-04: 10 meq via ORAL
  Filled 2022-05-04: qty 1

## 2022-05-04 NOTE — Care Management Important Message (Signed)
Important Message  Patient Details  Name: Jim Dunn MRN: [id number] Date of Birth: 10-Jun-1966   Medicare Important Message Given:  Yes     Truddie Hidden, RN 05/04/2022, 3:30 PM

## 2022-05-04 NOTE — Procedures (Signed)
Routine EEG Report  Jim Dunn is a 56 y.o. male with a history of encephalopathy who is undergoing an EEG to evaluate for seizures.  Report: This EEG was acquired with electrodes placed according to the International 10-20 electrode system (including Fp1, Fp2, F3, F4, C3, C4, P3, P4, O1, O2, T3, T4, T5, T6, A1, A2, Fz, Cz, Pz). The following electrodes were missing or displaced: none.  The best background was 8.5 Hz with overriding beta frequencies. This activity is reactive to stimulation. There was no clear waking rhythm. Sleep was identified by K complexes and sleep spindles. There was no focal slowing. There were no interictal epileptiform discharges. There were no electrographic seizures identified. Photic stimulation and hyperventilation were not performed.  Impression: This EEG was obtained while asleep and is normal.    Clinical Correlation: Normal EEGs, however, do not rule out epilepsy.  Bing Neighbors, MD [facility] [phone]  If 7pm- 7am, please page neurology on call as listed in AMION.

## 2022-05-04 NOTE — Care Management CC44 (Signed)
Condition Code 44 Documentation Completed  Patient Details  Name: Jim Dunn MRN: [id number] Date of Birth: 1965/09/06   Condition Code 44 given:  Yes Patient signature on Condition Code 44 notice:  Yes Documentation of 2 MD's agreement:  Yes Code 44 added to claim:  Yes    Truddie Hidden, RN 05/04/2022, 3:31 PM

## 2022-05-04 NOTE — Progress Notes (Signed)
CSW spoke with patient via phone who states he does not want to return to the group home because there are individuals in the home using illegal substances. Patient states he wants to move back to [facility] with his girlfriend Jim Dunn. CSW explained that he would have to return to the group home once medically cleared and if his girlfriend Jim Dunn wanted to pursue a transfer of guardianship that she would have to initiate that through the [facility] of Courts. Patient asked CSW to call and speak directly with Jim Dunn to inform her of this information.  CSW spoke with patient's legal guardian [name] of [facility] who states she is agreeable for a transfer of guardianship if the appropriate steps are taken and eventually approved by a Education administrator. Jim Dunn gave CSW permission to speak with Jim Dunn and provide her with information.  CSW spoke with patient's girlfriend Jim Dunn to explain how to initiate the change in guardianship. CSW answered all questions and sent Jim Dunn an e-mail with information discussed.  [name], MSW, LCSW Transitions of Care  Clinical Social Worker II [phone]

## 2022-05-04 NOTE — Plan of Care (Signed)
  Problem: Education: Goal: Knowledge of General Education information will improve Description: Including pain rating scale, medication(s)/side effects and non-pharmacologic comfort measures Outcome: Adequate for Discharge   Problem: Health Behavior/Discharge Planning: Goal: Ability to manage health-related needs will improve Outcome: Adequate for Discharge   Problem: Clinical Measurements: Goal: Ability to maintain clinical measurements within normal limits will improve Outcome: Adequate for Discharge Goal: Will remain free from infection Outcome: Adequate for Discharge Goal: Diagnostic test results will improve Outcome: Adequate for Discharge Goal: Respiratory complications will improve Outcome: Adequate for Discharge Goal: Cardiovascular complication will be avoided Outcome: Adequate for Discharge   Problem: Activity: Goal: Risk for activity intolerance will decrease Outcome: Adequate for Discharge   Problem: Nutrition: Goal: Adequate nutrition will be maintained Outcome: Adequate for Discharge   Problem: Coping: Goal: Level of anxiety will decrease Outcome: Adequate for Discharge   Problem: Elimination: Goal: Will not experience complications related to bowel motility Outcome: Adequate for Discharge Goal: Will not experience complications related to urinary retention Outcome: Adequate for Discharge   Problem: Pain Managment: Goal: General experience of comfort will improve Outcome: Adequate for Discharge   Problem: Safety: Goal: Ability to remain free from injury will improve Outcome: Adequate for Discharge   Problem: Skin Integrity: Goal: Risk for impaired skin integrity will decrease Outcome: Adequate for Discharge   Problem: Fluid Volume: Goal: Hemodynamic stability will improve Outcome: Adequate for Discharge   Problem: Clinical Measurements: Goal: Diagnostic test results will improve Outcome: Adequate for Discharge Goal: Signs and symptoms of  infection will decrease Outcome: Adequate for Discharge   Problem: Respiratory: Goal: Ability to maintain adequate ventilation will improve Outcome: Adequate for Discharge   Problem: Education: Goal: Expressions of having a comfortable level of knowledge regarding the disease process will increase Outcome: Adequate for Discharge   Problem: Coping: Goal: Ability to adjust to condition or change in health will improve Outcome: Adequate for Discharge Goal: Ability to identify appropriate support needs will improve Outcome: Adequate for Discharge   Problem: Health Behavior/Discharge Planning: Goal: Compliance with prescribed medication regimen will improve Outcome: Adequate for Discharge   Problem: Medication: Goal: Risk for medication side effects will decrease Outcome: Adequate for Discharge   Problem: Clinical Measurements: Goal: Complications related to the disease process, condition or treatment will be avoided or minimized Outcome: Adequate for Discharge Goal: Diagnostic test results will improve Outcome: Adequate for Discharge   Problem: Safety: Goal: Verbalization of understanding the information provided will improve Outcome: Adequate for Discharge   Problem: Self-Concept: Goal: Level of anxiety will decrease Outcome: Adequate for Discharge Goal: Ability to verbalize feelings about condition will improve Outcome: Adequate for Discharge

## 2022-05-04 NOTE — Discharge Summary (Signed)
Robley Dutton DJT:[id number] DOB: 1966/04/27 DOA: 05/02/2022  PCP: Patient, No Pcp Per  Admit date: 05/02/2022 Discharge date: 05/04/22  Admitted From: home Disposition:  home  Recommendations for Outpatient Follow-up:  Follow up with PCP in 1 week Please obtain BMP/CBC in one week Please follow up with neurology in one week     Discharge Condition:Stable CODE STATUS:full   Diet recommendation: Heart Healthy  Brief/Interim Summary: Per HPI: Shyon Slagter is a 56 y.o. African-American male with medical history significant for COPD, CHF, paranoid schizophrenia, seizure disorder and tobacco abuse, who presented to the ER with acute onset of seizures was tonic-clonic and lasted about 30 seconds.  The patient was apparently cleared for discharge from our behavioral health unit and was waiting for a ride before this happened.  He was having sonorous respirations and urinary as well as stool incontinence as well as postictal confusion.  He denied any chest pain or palpitations.  No cough or wheezing or dyspnea.  No dysuria, oliguria or hematuria or flank pain.  No focal muscle weakness.  He was having mild left facial numbness with no dysphagia or dysarthria.   ED Course: When him to the ER he was noted to be febrile with temperature of 101.5 and BP was 167/105 heart rate 108 and respiratory rate 26 with pulse currently 96% on room air.  Labs revealed hypokalemia of 3.4 with AST of 46 and otherwise unremarkable CMP.  Lactic acid was 5.8 and procalcitonin less than 0.1.  CBC showed leukocytosis of 11.2.  PT and INR were normal.  Blood cultures were drawn.  UA showed 100 protein, 5 ketones and was otherwise unremarkable.  Imaging: Portable chest x-ray showed changes consistent with CHF with no focal pneumonia seen.  His toxicology was positive for cocaine and benzodiapine   Seizure disorder [facility]) Patient was admitted to the hospital.  Neurology was consulted Counseled patient not to do cocaine is likely  the cause of his seizure EEG was normal    Acute on chronic systolic CHF (congestive heart failure) (HCC) Received IV gentle diuresis Was euvolemic and asymptomatic Continue home meds   Sepsis  Sepsis ruled out He had LP-no infection No evidence of meningitis or encephalitis and all antibiotics was discontinued by infectious disease   Hypokalemia Was replaced   Dyslipidemia Continue statin therapy.   Anxiety Continue home meds   Essential hypertension Stable continue home meds   Schizophrenia [facility]) Podiatry was consulted.  Patient did not meet criteria for psychiatric inpatient beds.  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   Paranoid schizophrenia with hallucinations   Seizure (HCC)   Chronic post-traumatic stress disorder (PTSD)   Dementia (HCC)   SIADH (syndrome of inappropriate ADH production) (HCC)   Disorganized schizophrenia (HCC)   Dementia without behavioral disturbance (HCC)   Hyponatremia   Schizophrenia (HCC)   Essential hypertension   Anxiety   Dyslipidemia    Discharge Instructions  Discharge Instructions     Diet - low sodium heart healthy   Complete by: As directed    Increase activity slowly   Complete by: As directed       Allergies as of 05/04/2022  No Known Allergies      Medication List     STOP taking these medications    acetaminophen 325 MG tablet Commonly known as: TYLENOL       TAKE these medications    ALPRAZolam 1 MG tablet Commonly known as: XANAX Take 1 tablet (1 mg total) by mouth 2 (two) times daily.   amLODipine 10 MG tablet Commonly known as: NORVASC Take  1 tablet (10 mg total) by mouth daily.   atorvastatin 40 MG tablet Commonly known as: LIPITOR Take 1 tablet (40 mg total) by mouth daily.   busPIRone 10 MG tablet Commonly known as: BUSPAR Take 1 tablet (10 mg total) by mouth 3 (three) times daily.   carvedilol 25 MG tablet Commonly known as: COREG Take 1 tablet (25 mg total) by mouth 2 (two) times daily with a meal.   digoxin 0.25 MG tablet Commonly known as: LANOXIN Take 1 tablet (0.25 mg total) by mouth daily.   divalproex 500 MG 24 hr tablet Commonly known as: DEPAKOTE ER Take 1 tablet (500 mg total) by mouth 2 (two) times daily.   Entresto 49-51 MG Generic drug: sacubitril-valsartan Take 1 tablet by mouth 2 (two) times daily.   melatonin 3 MG Tabs tablet Take 1 tablet (3 mg total) by mouth at bedtime.   mirtazapine 15 MG tablet Commonly known as: REMERON Take 1 tablet (15 mg total) by mouth at bedtime.   paliperidone 6 MG 24 hr tablet Commonly known as: INVEGA Take 1 tablet (6 mg total) by mouth daily.   polyethylene glycol powder 17 GM/SCOOP powder Commonly known as: GLYCOLAX/MIRALAX Dissolve 17 gm in liquid and take by mouth 2 (two) times daily.   pregabalin 75 MG capsule Commonly known as: LYRICA Take 1 capsule (75 mg total) by mouth 2 (two) times daily.   QUEtiapine 50 MG tablet Commonly known as: SEROQUEL Take 1 tablet (50 mg total) by mouth at bedtime.   Senexon-S 8.6-50 MG tablet Generic drug: senna-docusate Take 2 tablets by mouth 2 (two) times daily.   sodium chloride 1 g tablet Take 1 tablet (1 g total) by mouth 3 (three) times daily with meals.        Follow-up Information     Go to  [facility] EMERGENCY DEPARTMENT.   Specialty: Emergency Medicine Why: If symptoms worsen Contact information: [id number] [place] [address] [phone]               No Known Allergies  Consultations: ID, neurology and  psychiatry   Procedures/Studies: EEG adult  Result Date: 05/07/2022 Jefferson Fuel, MD     05/04/2022  9:57 AM Routine EEG Report Zyeir Dymek is a 56 y.o. male with a history of encephalopathy who is undergoing an EEG to evaluate for seizures. Report: This EEG was acquired with electrodes placed according to the International 10-20 electrode system (including Fp1, Fp2, F3, F4, C3, C4, P3, P4, O1, O2, T3, T4, T5, T6, A1, A2, Fz, Cz, Pz). The following electrodes were missing or displaced: none. The best background was 8.5 Hz with overriding beta frequencies. This activity is reactive to stimulation. There was no clear waking rhythm. Sleep was identified by K complexes and sleep spindles. There was no focal slowing. There were no interictal epileptiform discharges. There were no electrographic seizures identified. Photic stimulation and hyperventilation were not performed. Impression: This EEG was obtained while asleep and is normal.   Clinical Correlation: Normal EEGs, however, do  not rule out epilepsy. Bing Neighbors, MD [facility] [phone] If 7pm- 7am, please page neurology on call as listed in AMION.   DG Chest Portable 1 View  Result Date: 05/03/2022 CLINICAL DATA:  Recent syncopal episode and possible sepsis EXAM: PORTABLE CHEST 1 VIEW COMPARISON:  03/19/2022 FINDINGS: Cardiac shadow is enlarged but stable. Central vascular congestion is noted without significant edema. No focal infiltrate is noted. No bony abnormality is seen. IMPRESSION: Changes consistent with CHF.  No focal pneumonia is seen. Electronically Signed   By: Alcide Clever M.D.   On: 05/03/2022 01:27   CT Cervical Spine Wo Contrast  Result Date: 05/03/2022 CLINICAL DATA:  Psychiatric evaluation. EXAM: CT CERVICAL SPINE WITHOUT CONTRAST TECHNIQUE: Multidetector CT imaging of the cervical spine was performed without intravenous contrast. Multiplanar CT image reconstructions were also generated. RADIATION DOSE REDUCTION:  This exam was performed according to the departmental dose-optimization program which includes automated exposure control, adjustment of the mA and/or kV according to patient size and/or use of iterative reconstruction technique. COMPARISON:  February 23, 2020 FINDINGS: Alignment: There is mild reversal of the normal cervical spine lordosis. Skull base and vertebrae: No acute fracture. No primary bone lesion or focal pathologic process. Soft tissues and spinal canal: No prevertebral fluid or swelling. No visible canal hematoma. Disc levels: Marked severity endplate sclerosis and mild to moderate severity anterior osteophyte formation are seen at the levels of C3-C4, C4-C5, C5-C6, C6-C7 and C7-T1. Moderate to marked severity intervertebral disc space narrowing is seen at C3-C4, C4-C5, C5-C6, C6-C7 and C7-T1. Mild, bilateral multilevel facet joint hypertrophy is noted. Upper chest: Negative. Other: None. IMPRESSION: Marked severity multilevel degenerative changes without evidence of an acute fracture or subluxation. Electronically Signed   By: Aram Candela M.D.   On: 05/03/2022 00:50   CT HEAD WO CONTRAST ( )  Result Date: 05/03/2022 CLINICAL DATA:  Psychiatric evaluation. EXAM: CT HEAD WITHOUT CONTRAST TECHNIQUE: Contiguous axial images were obtained from the base of the skull through the vertex without intravenous contrast. RADIATION DOSE REDUCTION: This exam was performed according to the departmental dose-optimization program which includes automated exposure control, adjustment of the mA and/or kV according to patient size and/or use of iterative reconstruction technique. COMPARISON:  October 31, 2021 FINDINGS: Brain: No evidence of acute infarction, hemorrhage or hydrocephalus. A large, stable arachnoid cyst is seen within the posterior parietal lobe on the right. Vascular: No hyperdense vessel or unexpected calcification. Skull: Normal. Negative for fracture or focal lesion. Sinuses/Orbits: A 10 mm x 8  mm right maxillary sinus polyp versus mucous retention cyst is noted. Other: None. IMPRESSION: 1. No acute intracranial abnormality. 2. Large, stable right posterior parietal arachnoid cyst. Electronically Signed   By: Aram Candela M.D.   On: 05/03/2022 00:43      Subjective: Feels well. No sob or cp  Discharge Exam: Vitals:   05/04/22 1559 05/04/22 1702  BP: 132/76 130/74  Pulse: 77 80  Resp: 18 17  Temp: (!) 97.5 F (36.4 C) 98.2 F (36.8 C)  SpO2: 100% 100%   Vitals:   05/04/22 0851 05/04/22 1155 05/04/22 1559 05/04/22 1702  BP: 127/74 126/65 132/76 130/74  Pulse: 68 87 77 80  Resp: 18 18 18 17   Temp: 98.6 F (37 C) (!) 97.4 F (36.3 C) (!) 97.5 F (36.4 C) 98.2 F (36.8 C)  TempSrc:      SpO2: 100% 93% 100% 100%  Weight:      Height:  General: Pt is alert, awake, not in acute distress Cardiovascular: RRR, S1/S2 +, no rubs, no gallops Respiratory: CTA bilaterally, no wheezing, no rhonchi Abdominal: Soft, NT, ND, bowel sounds + Extremities: no edema, no cyanosis    The results of significant diagnostics from this hospitalization (including imaging, microbiology, ancillary and laboratory) are listed below for reference.     Microbiology: Recent Results (from the past 240 hour(s))  Resp Panel by RT-PCR (Flu A&B, Covid) Anterior Nasal Swab     Status: None   Collection Time: 05/02/22  4:30 PM   Specimen: Anterior Nasal Swab  Result Value Ref Range Status   SARS Coronavirus 2 by RT PCR NEGATIVE NEGATIVE Final    Comment: (NOTE) SARS-CoV-2 target nucleic acids are NOT DETECTED.  The SARS-CoV-2 RNA is generally detectable in upper respiratory specimens during the acute phase of infection. The lowest concentration of SARS-CoV-2 viral copies this assay can detect is 138 copies/mL. A negative result does not preclude SARS-Cov-2 infection and should not be used as the sole basis for treatment or other patient management decisions. A negative result may  occur with  improper specimen collection/handling, submission of specimen other than nasopharyngeal swab, presence of viral mutation(s) within the areas targeted by this assay, and inadequate number of viral copies(<138 copies/mL). A negative result must be combined with clinical observations, patient history, and epidemiological information. The expected result is Negative.  Fact Sheet for Patients:  BloggerCourse.com  Fact Sheet for Healthcare Providers:  SeriousBroker.it  This test is no t yet approved or cleared by the [place] FDA and  has been authorized for detection and/or diagnosis of SARS-CoV-2 by FDA under an Emergency Use Authorization (EUA). This EUA will remain  in effect (meaning this test can be used) for the duration of the COVID-19 declaration under Section 564(b)(1) of the Act, 21 U.S.C.section 360bbb-3(b)(1), unless the authorization is terminated  or revoked sooner.       Influenza A by PCR NEGATIVE NEGATIVE Final   Influenza B by PCR NEGATIVE NEGATIVE Final    Comment: (NOTE) The Xpert Xpress SARS-CoV-2/FLU/RSV plus assay is intended as an aid in the diagnosis of influenza from Nasopharyngeal swab specimens and should not be used as a sole basis for treatment. Nasal washings and aspirates are unacceptable for Xpert Xpress SARS-CoV-2/FLU/RSV testing.  Fact Sheet for Patients: BloggerCourse.com  Fact Sheet for Healthcare Providers: SeriousBroker.it  This test is not yet approved or cleared by the [place] FDA and has been authorized for detection and/or diagnosis of SARS-CoV-2 by FDA under an Emergency Use Authorization (EUA). This EUA will remain in effect (meaning this test can be used) for the duration of the COVID-19 declaration under Section 564(b)(1) of the Act, 21 U.S.C. section 360bbb-3(b)(1), unless the authorization is terminated  or revoked.  Performed at [facility], [address] [place], [place] [address]   Blood culture (routine x 2)     Status: None (Preliminary result)   Collection Time: 05/02/22 11:57 PM   Specimen: BLOOD  Result Value Ref Range Status   Specimen Description BLOOD RIGHT UPPER ARM  Final   Special Requests   Final    BOTTLES DRAWN AEROBIC AND ANAEROBIC Blood Culture adequate volume   Culture   Final    NO GROWTH 2 DAYS Performed at [facility], [address] [place], [place] [address]    Report Status PENDING  Incomplete  Blood culture (routine x 2)     Status: None (Preliminary result)  Collection Time: 05/02/22 11:57 PM   Specimen: BLOOD  Result Value Ref Range Status   Specimen Description BLOOD RIGHT [facility]  Final   Special Requests   Final    BOTTLES DRAWN AEROBIC AND ANAEROBIC Blood Culture adequate volume   Culture   Final    NO GROWTH 2 DAYS Performed at [facility], [address], [place], [place] [address]    Report Status PENDING  Incomplete  SARS Coronavirus 2 by RT PCR (hospital order, performed in [facility] hospital lab) *cepheid single result test* Anterior Nasal Swab     Status: None   Collection Time: 05/03/22  1:03 AM   Specimen: Anterior Nasal Swab  Result Value Ref Range Status   SARS Coronavirus 2 by RT PCR NEGATIVE NEGATIVE Final    Comment: (NOTE) SARS-CoV-2 target nucleic acids are NOT DETECTED.  The SARS-CoV-2 RNA is generally detectable in upper and lower respiratory specimens during the acute phase of infection. The lowest concentration of SARS-CoV-2 viral copies this assay can detect is 250 copies / mL. A negative result does not preclude SARS-CoV-2 infection and should not be used as the sole basis for treatment or other patient management decisions.  A negative result may occur with improper specimen collection / handling, submission of specimen other than nasopharyngeal swab, presence of viral mutation(s)  within the areas targeted by this assay, and inadequate number of viral copies (<250 copies / mL). A negative result must be combined with clinical observations, patient history, and epidemiological information.  Fact Sheet for Patients:   RoadLapTop.co.za  Fact Sheet for Healthcare Providers: http://kim-miller.com/  This test is not yet approved or  cleared by the [place] FDA and has been authorized for detection and/or diagnosis of SARS-CoV-2 by FDA under an Emergency Use Authorization (EUA).  This EUA will remain in effect (meaning this test can be used) for the duration of the COVID-19 declaration under Section 564(b)(1) of the Act, 21 U.S.C. section 360bbb-3(b)(1), unless the authorization is terminated or revoked sooner.  Performed at [facility], [address], [place], [place] [address]   Urine Culture     Status: None   Collection Time: 05/03/22  2:00 AM   Specimen: Urine, Clean Catch  Result Value Ref Range Status   Specimen Description   Final    URINE, CLEAN CATCH Performed at [facility], [address] [place], [place] [address]    Special Requests   Final    NONE Performed at [facility], [address] [place], [place] [address]    Culture   Final    NO GROWTH Performed at [facility], [address] [place], [place] [address]    Report Status 05/04/2022 FINAL  Final  CSF culture w Gram Stain     Status: None (Preliminary result)   Collection Time: 05/03/22  2:00 AM   Specimen: CSF; Cerebrospinal Fluid  Result Value Ref Range Status   Specimen Description   Final    CSF Performed at [facility], [address] [place], [place] [address]    Special Requests   Final    NONE Performed at [facility], [address] [place], [place] [address]    Gram Stain   Final    NO ORGANISMS SEEN WBC SEEN RED BLOOD CELLS PRESENT Performed at  [facility], [address] [place], [place] [address]    Culture   Final    NO GROWTH 2  DAYS Performed at [facility], [address], [place], [place] [address]    Report Status PENDING  Incomplete     Labs: BNP (last 3 results) Recent Labs    09/29/21 0658 03/19/22 1547 05/04/22 0543  BNP 10.3 13.0 6.1   Basic Metabolic Panel: Recent Labs  Lab 05/02/22 0807 05/02/22 2332 05/03/22 0435 05/04/22 0543  NA 135 135 134* 136  K 3.9 3.4* 3.1* 3.5  CL 103 100 105 107  CO2 18* 21* 22 21*  GLUCOSE 119* 95 94 86  BUN CREATININE 0.80 0.88 0.70 0.76  CALCIUM 9.3 9.2 8.1* 7.9*  MG  --   --  2.1  --    Liver Function Tests: Recent Labs  Lab 05/02/22 0807 05/02/22 2332  AST 47* 46*  ALT 11 14  ALKPHOS 67 64  BILITOT 0.9 0.6  PROT 8.2* 8.0  ALBUMIN 4.3 4.3   No results for input(s): "LIPASE", "AMYLASE" in the last 168 hours. No results for input(s): "AMMONIA" in the last 168 hours. CBC: Recent Labs  Lab 05/02/22 0807 05/02/22 2332 05/03/22 0435  WBC 8.6 11.2* 10.7*  NEUTROABS 4.9 5.6  --   HGB 13.7 13.6 12.1*  HCT 43.9 42.5 37.5*  MCV 70.1* 69.3* 68.6*  PLT 297 219 226   Cardiac Enzymes: No results for input(s): "CKTOTAL", "CKMB", "CKMBINDEX", "TROPONINI" in the last 168 hours. BNP: Invalid input(s): "POCBNP" CBG: Recent Labs  Lab 05/02/22 0858 05/02/22 2313  GLUCAP 136* 86   D-Dimer No results for input(s): "DDIMER" in the last 72 hours. Hgb A1c No results for input(s): "HGBA1C" in the last 72 hours. Lipid Profile No results for input(s): "CHOL", "HDL", "LDLCALC", "TRIG", "CHOLHDL", "LDLDIRECT" in the last 72 hours. Thyroid function studies No results for input(s): "TSH", "T4TOTAL", "T3FREE", "THYROIDAB" in the last 72 hours.  Invalid input(s): "FREET3" Anemia work up No results for input(s): "VITAMINB12", "FOLATE", "FERRITIN", "TIBC", "IRON", "RETICCTPCT" in the last 72 hours. Urinalysis    Component Value  Date/Time   COLORURINE YELLOW (A) 05/03/2022 0159   APPEARANCEUR CLEAR (A) 05/03/2022 0159   LABSPEC 1.026 05/03/2022 0159   PHURINE 5.0 05/03/2022 0159   GLUCOSEU NEGATIVE 05/03/2022 0159   HGBUR NEGATIVE 05/03/2022 0159   BILIRUBINUR NEGATIVE 05/03/2022 0159   KETONESUR 5 (A) 05/03/2022 0159   PROTEINUR 100 (A) 05/03/2022 0159   NITRITE NEGATIVE 05/03/2022 0159   LEUKOCYTESUR NEGATIVE 05/03/2022 0159   Sepsis Labs Recent Labs  Lab 05/02/22 0807 05/02/22 2332 05/03/22 0435  WBC 8.6 11.2* 10.7*   Microbiology Recent Results (from the past 240 hour(s))  Resp Panel by RT-PCR (Flu A&B, Covid) Anterior Nasal Swab     Status: None   Collection Time: 05/02/22  4:30 PM   Specimen: Anterior Nasal Swab  Result Value Ref Range Status   SARS Coronavirus 2 by RT PCR NEGATIVE NEGATIVE Final    Comment: (NOTE) SARS-CoV-2 target nucleic acids are NOT DETECTED.  The SARS-CoV-2 RNA is generally detectable in upper respiratory specimens during the acute phase of infection. The lowest concentration of SARS-CoV-2 viral copies this assay can detect is 138 copies/mL. A negative result does not preclude SARS-Cov-2 infection and should not be used as the sole basis for treatment or other patient management decisions. A negative result may occur with  improper specimen collection/handling, submission of specimen other than nasopharyngeal swab, presence of viral mutation(s) within the areas targeted by this assay, and inadequate number of viral copies(<138 copies/mL). A negative  result must be combined with clinical observations, patient history, and epidemiological information. The expected result is Negative.  Fact Sheet for Patients:  BloggerCourse.com  Fact Sheet for Healthcare Providers:  SeriousBroker.it  This test is no t yet approved or cleared by the [place] FDA and  has been authorized for detection and/or diagnosis of  SARS-CoV-2 by FDA under an Emergency Use Authorization (EUA). This EUA will remain  in effect (meaning this test can be used) for the duration of the COVID-19 declaration under Section 564(b)(1) of the Act, 21 U.S.C.section 360bbb-3(b)(1), unless the authorization is terminated  or revoked sooner.       Influenza A by PCR NEGATIVE NEGATIVE Final   Influenza B by PCR NEGATIVE NEGATIVE Final    Comment: (NOTE) The Xpert Xpress SARS-CoV-2/FLU/RSV plus assay is intended as an aid in the diagnosis of influenza from Nasopharyngeal swab specimens and should not be used as a sole basis for treatment. Nasal washings and aspirates are unacceptable for Xpert Xpress SARS-CoV-2/FLU/RSV testing.  Fact Sheet for Patients: BloggerCourse.com  Fact Sheet for Healthcare Providers: SeriousBroker.it  This test is not yet approved or cleared by the [place] FDA and has been authorized for detection and/or diagnosis of SARS-CoV-2 by FDA under an Emergency Use Authorization (EUA). This EUA will remain in effect (meaning this test can be used) for the duration of the COVID-19 declaration under Section 564(b)(1) of the Act, 21 U.S.C. section 360bbb-3(b)(1), unless the authorization is terminated or revoked.  Performed at [facility], [address] [place], [place] [address]   Blood culture (routine x 2)     Status: None (Preliminary result)   Collection Time: 05/02/22 11:57 PM   Specimen: BLOOD  Result Value Ref Range Status   Specimen Description BLOOD RIGHT UPPER ARM  Final   Special Requests   Final    BOTTLES DRAWN AEROBIC AND ANAEROBIC Blood Culture adequate volume   Culture   Final    NO GROWTH 2 DAYS Performed at [facility], [address], [place], [place] [address]    Report Status PENDING  Incomplete  Blood culture (routine x 2)     Status: None (Preliminary result)   Collection Time: 05/02/22 11:57 PM    Specimen: BLOOD  Result Value Ref Range Status   Specimen Description BLOOD RIGHT [facility]  Final   Special Requests   Final    BOTTLES DRAWN AEROBIC AND ANAEROBIC Blood Culture adequate volume   Culture   Final    NO GROWTH 2 DAYS Performed at [facility], [address] [place], [place] [address]    Report Status PENDING  Incomplete  SARS Coronavirus 2 by RT PCR (hospital order, performed in [facility] hospital lab) *cepheid single result test* Anterior Nasal Swab     Status: None   Collection Time: 05/03/22  1:03 AM   Specimen: Anterior Nasal Swab  Result Value Ref Range Status   SARS Coronavirus 2 by RT PCR NEGATIVE NEGATIVE Final    Comment: (NOTE) SARS-CoV-2 target nucleic acids are NOT DETECTED.  The SARS-CoV-2 RNA is generally detectable in upper and lower respiratory specimens during the acute phase of infection. The lowest concentration of SARS-CoV-2 viral copies this assay can detect is 250 copies / mL. A negative result does not preclude SARS-CoV-2 infection and should not be used as the sole basis for treatment or other patient management decisions.  A negative result may occur with improper specimen collection / handling, submission of specimen  other than nasopharyngeal swab, presence of viral mutation(s) within the areas targeted by this assay, and inadequate number of viral copies (<250 copies / mL). A negative result must be combined with clinical observations, patient history, and epidemiological information.  Fact Sheet for Patients:   RoadLapTop.co.za  Fact Sheet for Healthcare Providers: http://kim-miller.com/  This test is not yet approved or  cleared by the [place] FDA and has been authorized for detection and/or diagnosis of SARS-CoV-2 by FDA under an Emergency Use Authorization (EUA).  This EUA will remain in effect (meaning this test can be used) for the duration of the COVID-19 declaration  under Section 564(b)(1) of the Act, 21 U.S.C. section 360bbb-3(b)(1), unless the authorization is terminated or revoked sooner.  Performed at [facility], [address], [place], [place] [address]   Urine Culture     Status: None   Collection Time: 05/03/22  2:00 AM   Specimen: Urine, Clean Catch  Result Value Ref Range Status   Specimen Description   Final    URINE, CLEAN CATCH Performed at [facility], [address], [place], [place] [address]    Special Requests   Final    NONE Performed at [facility], [address], [place], [place] [address]    Culture   Final    NO GROWTH Performed at [facility], [address], [place], [place] [address]    Report Status 05/04/2022 FINAL  Final  CSF culture w Gram Stain     Status: None (Preliminary result)   Collection Time: 05/03/22  2:00 AM   Specimen: CSF; Cerebrospinal Fluid  Result Value Ref Range Status   Specimen Description   Final    CSF Performed at [facility], [address], [place], [place] [address]    Special Requests   Final    NONE Performed at [facility], [address] [place], [place] [address]    Gram Stain   Final    NO ORGANISMS SEEN WBC SEEN RED BLOOD CELLS PRESENT Performed at [facility], [address] [place], [place] [address]    Culture   Final    NO GROWTH 2 DAYS Performed at [facility], [address] [place], [place] [address]    Report Status PENDING  Incomplete     Time coordinating discharge: Over 30 minutes  SIGNED:   Lynn Ito, MD  [facility] 05/05/2022, 5:08 PM Pager   If 7PM-7AM, please contact night-coverage [email or web address] Password [email or web address]

## 2022-05-04 NOTE — Evaluation (Signed)
Physical Therapy Evaluation Patient Details Name: Jim Dunn MRN: [id number] DOB: 1966-06-15 Today's Date: 05/04/2022  History of Present Illness  Pt is a 56 y.o. African-American male with medical history significant for COPD, CHF, paranoid schizophrenia, seizure disorder and tobacco abuse, who presented to the ER with acute onset of seizures was tonic-clonic and lasted about 30 seconds.  The patient was apparently cleared for discharge from our behavioral health unit and was waiting for a ride before this happened.   Clinical Impression  Patient alert agreeable to PT, motivated to work with therapy so he could go home. He reported at baseline he uses a [facility], modI/I for ADLs, facility assists with cooking/cleaning, laundry if needed. Denies any other falls.   He was supervision for mobility with and without RW. Pt able to perform sit <> Stand with supervision, no LOB. Educated on proper positioning of RW for safety. He ambulated ~123ft with RW and without; improved steadiness and safety noted with the RW, and pt agreeable to utilize. Returned to room, where MMT demonstrated gross strength of BLE WFLs.  Overall the patient demonstrated deficits (see "PT Problem List") that impede the patient's functional abilities, safety, and mobility and would benefit from skilled PT intervention. Recommendation at this time is HHPT to return pt to PLOF and maximize safety and education.        Recommendations for follow up therapy are one component of a multi-disciplinary discharge planning process, led by the attending physician.  Recommendations may be updated based on patient status, additional functional criteria and insurance authorization.  Follow Up Recommendations Home health PT      Assistance Recommended at Discharge Intermittent Supervision/Assistance  Patient can return home with the following  Assistance with cooking/housework;Direct supervision/assist for financial management;Assist for  transportation;Help with stairs or ramp for entrance;Direct supervision/assist for medications management    Equipment Recommendations Rolling walker (2 wheels)  Recommendations for Other Services       Functional Status Assessment Patient has had a recent decline in their functional status and demonstrates the ability to make significant improvements in function in a reasonable and predictable amount of time.     Precautions / Restrictions Precautions Precautions: Fall Restrictions Weight Bearing Restrictions: No      Mobility  Bed Mobility               General bed mobility comments: pt up in recliner at start/end of session    Transfers Overall transfer level: Needs assistance Equipment used: Rolling walker (2 wheels), None Transfers: Sit to/from Stand Sit to Stand: Supervision                Ambulation/Gait Ambulation/Gait assistance: Min guard Gait Distance (Feet): 190 Feet Assistive device: Rolling walker (2 wheels), None         General Gait Details: improved safety and steadiness with RW pt agreeable to use  Stairs            Wheelchair Mobility    Modified Rankin (Stroke Patients Only)       Balance Overall balance assessment: Needs assistance Sitting-balance support: Feet supported Sitting balance-Leahy Scale: Good       Standing balance-Leahy Scale: Good                               Pertinent Vitals/Pain Pain Assessment Pain Assessment: No/denies pain    Home Living Family/patient expects to be discharged to:: Private residence Living Arrangements: Parent Available  Help at Discharge: Family Type of Home: House Home Access: Level entry       Home Layout: Two level;Able to live on main level with bedroom/bathroom Home Equipment: Gilmer Mor - single point      Prior Function Prior Level of Function : Independent/Modified Independent               ADLs Comments: facility provides  cooking/cleaning/laundry     Hand Dominance        Extremity/Trunk Assessment   Upper Extremity Assessment Upper Extremity Assessment: Overall WFL for tasks assessed    Lower Extremity Assessment Lower Extremity Assessment:  (grossly 4+/5 bilaterally)    Cervical / Trunk Assessment Cervical / Trunk Assessment: Normal  Communication   Communication: No difficulties  Cognition Arousal/Alertness: Awake/alert Behavior During Therapy: WFL for tasks assessed/performed Overall Cognitive Status: Within Functional Limits for tasks assessed                                          General Comments      Exercises     Assessment/Plan    PT Assessment Patient needs continued PT services  PT Problem List Decreased strength;Decreased mobility;Decreased activity tolerance;Decreased balance       PT Treatment Interventions DME instruction;Therapeutic exercise;Gait training;Balance training;Neuromuscular re-education;Functional mobility training;Stair training;Therapeutic activities;Patient/family education    PT Goals (Current goals can be found in the Care Plan section)  Acute Rehab PT Goals Patient Stated Goal: to go home PT Goal Formulation: With patient Time For Goal Achievement: 05/18/22 Potential to Achieve Goals: Good    Frequency Min 2X/week     Co-evaluation               AM-PAC PT "6 Clicks" Mobility  Outcome Measure Help needed turning from your back to your side while in a flat bed without using bedrails?: None Help needed moving from lying on your back to sitting on the side of a flat bed without using bedrails?: None Help needed moving to and from a bed to a chair (including a wheelchair)?: None Help needed standing up from a chair using your arms (e.g., wheelchair or bedside chair)?: None Help needed to walk in hospital room?: A Little Help needed climbing 3-5 steps with a railing? : A Little 6 Click Score: 22    End of Session  Equipment Utilized During Treatment: Gait belt Activity Tolerance: Patient tolerated treatment well Patient left: in chair;with call bell/phone within reach;with chair alarm set Nurse Communication: Mobility status PT Visit Diagnosis: Other abnormalities of gait and mobility (R26.89);Difficulty in walking, not elsewhere classified (R26.2);Muscle weakness (generalized) (M62.81)    Time: 9518-8416 PT Time Calculation (min) (ACUTE ONLY): 10 min   Charges:   PT Evaluation $PT Eval Low Complexity: 1 Low         Olga Coaster PT, DPT 4:09 PM,05/04/22

## 2022-05-04 NOTE — TOC Transition Note (Signed)
Transition of Care [facility]) - CM/SW Discharge Note   Patient Details  Name: Jim Dunn MRN: [id number] Date of Birth: 11/13/1965  [facility] CM/SW Contact:  Truddie Hidden, RN Phone Number: 05/04/2022, 4:02 PM   Clinical Narrative:    Sherron Monday with Cottie Banda of [facility] to advise of patient discharge. [name] in agreement with transfer back to group home. Contacted Timmy Rogers from group home [phone] to advised of discharge. Code 44 given. Patient will need Taxi arrange. Group home to pay. RW requested and HH arranged via [facility]. TOC signing off.    Final next level of care: Group Home Barriers to Discharge: Barriers Resolved   Patient Goals and CMS Choice     Choice offered to / list presented to : [name]  Discharge Placement                    Patient and family notified of of transfer: 05/04/22  Discharge Plan and Services                DME Arranged: 05/06/22 rolling DME Agency: [employer] Date DME Agency Contacted: 05/04/22 Time DME Agency Contacted: [phone] Representative spoke with at DME Agency: [id number] HH Arranged: PT HH Agency: [facility] Date [facility] Agency Contacted: 05/04/22 Time HH Agency Contacted: [phone] Representative spoke with at [facility] Agency: [facility] Pack  Social Determinants of Health (SDOH) Interventions     Readmission Risk Interventions    01/18/2022    2:30 PM 01/23/2020    4:46 PM  Readmission Risk Prevention Plan  Transportation Screening Complete Complete  HRI or Home Care Consult  Complete  Social Work Consult for Recovery Care Planning/Counseling  Complete  Palliative Care Screening  Not Applicable  Medication Review 01/25/2020) Complete   PCP or Specialist appointment within 3-5 days of discharge Complete   HRI or Home Care Consult Complete   SW Recovery Care/Counseling Consult Complete   Palliative Care Screening Not Applicable   Skilled Nursing  Facility Not Applicable

## 2022-05-05 LAB — HSV 1/2 PCR, CSF
HSV-1 DNA: NEGATIVE
HSV-2 DNA: NEGATIVE

## 2022-05-06 LAB — CSF CULTURE W GRAM STAIN
Culture: NO GROWTH
Gram Stain: NONE SEEN

## 2022-05-08 LAB — CULTURE, BLOOD (ROUTINE X 2)
Culture: NO GROWTH
Culture: NO GROWTH
Special Requests: ADEQUATE
Special Requests: ADEQUATE

## 2023-01-25 ENCOUNTER — Emergency Department (HOSPITAL_COMMUNITY)
Admission: EM | Admit: 2023-01-25 | Discharge: 2023-01-25 | Disposition: A | Discharge status: Other Acute Inpatient Hospital | Payer: Medicare PPO | Attending: Emergency Medicine | Admitting: Emergency Medicine

## 2023-01-25 ENCOUNTER — Other Ambulatory Visit: Payer: Self-pay

## 2023-01-25 ENCOUNTER — Encounter (HOSPITAL_COMMUNITY): Payer: Self-pay

## 2023-01-25 DIAGNOSIS — R443 Hallucinations, unspecified: Secondary | ICD-10-CM | POA: Insufficient documentation

## 2023-01-25 DIAGNOSIS — R440 Auditory hallucinations: Secondary | ICD-10-CM

## 2023-01-25 DIAGNOSIS — Z1152 Encounter for screening for COVID-19: Secondary | ICD-10-CM | POA: Insufficient documentation

## 2023-01-25 DIAGNOSIS — Z79899 Other long term (current) drug therapy: Secondary | ICD-10-CM | POA: Insufficient documentation

## 2023-01-25 DIAGNOSIS — F200 Paranoid schizophrenia: Secondary | ICD-10-CM | POA: Diagnosis present

## 2023-01-25 DIAGNOSIS — R4182 Altered mental status, unspecified: Secondary | ICD-10-CM | POA: Diagnosis not present

## 2023-01-25 DIAGNOSIS — Z8616 Personal history of COVID-19: Secondary | ICD-10-CM | POA: Diagnosis not present

## 2023-01-25 LAB — COMPREHENSIVE METABOLIC PANEL
ALT: 10 U/L (ref 0–44)
AST: 18 U/L (ref 15–41)
Albumin: 4 g/dL (ref 3.5–5.0)
Alkaline Phosphatase: 56 U/L (ref 38–126)
Anion gap: 11 (ref 5–15)
BUN: 5 mg/dL — ABNORMAL LOW (ref 6–20)
CO2: 23 mmol/L (ref 22–32)
Calcium: 9 mg/dL (ref 8.9–10.3)
Chloride: 93 mmol/L — ABNORMAL LOW (ref 98–111)
Creatinine, Ser: 0.54 mg/dL — ABNORMAL LOW (ref 0.61–1.24)
GFR, Estimated: >60 mL/min (ref >60)
Glucose, Bld: 100 mg/dL — ABNORMAL HIGH (ref 70–99)
Potassium: 3.3 mmol/L — ABNORMAL LOW (ref 3.5–5.1)
Sodium: 127 mmol/L — ABNORMAL LOW (ref 135–145)
Total Bilirubin: 1.1 mg/dL (ref 0.3–1.2)
Total Protein: 7.8 g/dL (ref 6.5–8.1)

## 2023-01-25 LAB — CBC WITH DIFFERENTIAL/PLATELET
Abs Immature Granulocytes: 0.06 10*3/uL (ref 0.00–0.07)
Basophils Absolute: 0 10*3/uL (ref 0.0–0.1)
Basophils Relative: 1 %
Eosinophils Absolute: 0.1 10*3/uL (ref 0.0–0.5)
Eosinophils Relative: 1 %
HCT: 40.7 % (ref 39.0–52.0)
Hemoglobin: 13.5 g/dL (ref 13.0–17.0)
Immature Granulocytes: 1 %
Lymphocytes Relative: 33 %
Lymphs Abs: 2.7 10*3/uL (ref 0.7–4.0)
MCH: 22.5 pg — ABNORMAL LOW (ref 26.0–34.0)
MCHC: 33.2 g/dL (ref 30.0–36.0)
MCV: 67.8 fL — ABNORMAL LOW (ref 80.0–100.0)
Monocytes Absolute: 1 10*3/uL (ref 0.1–1.0)
Monocytes Relative: 12 %
Neutro Abs: 4.3 10*3/uL (ref 1.7–7.7)
Neutrophils Relative %: 52 %
Platelets: 202 10*3/uL (ref 150–400)
RBC: 6 MIL/uL — ABNORMAL HIGH (ref 4.22–5.81)
RDW: 17.2 % — ABNORMAL HIGH (ref 11.5–15.5)
WBC: 8.2 10*3/uL (ref 4.0–10.5)
nRBC: 0 % (ref 0.0–0.2)

## 2023-01-25 LAB — RAPID URINE DRUG SCREEN, HOSP PERFORMED
Amphetamines: NOT DETECTED
Barbiturates: NOT DETECTED
Benzodiazepines: NOT DETECTED
Cocaine: NOT DETECTED
Opiates: NOT DETECTED
Tetrahydrocannabinol: NOT DETECTED

## 2023-01-25 LAB — DIGOXIN LEVEL: Digoxin Level: 0.7 ng/mL — ABNORMAL LOW (ref 0.8–2.0)

## 2023-01-25 LAB — ETHANOL: Alcohol, Ethyl (B): <10 mg/dL (ref <10)

## 2023-01-25 LAB — SARS CORONAVIRUS 2 BY RT PCR: SARS Coronavirus 2 by RT PCR: NEGATIVE

## 2023-01-25 MED ORDER — SODIUM CHLORIDE 0.9 % IV BOLUS
1000.0000 mL | Freq: Once | INTRAVENOUS | Status: AC
  Administered 2023-01-25: 1000 mL via INTRAVENOUS

## 2023-01-25 MED ORDER — NICOTINE 21 MG/24HR TD PT24
21.0000 mg | MEDICATED_PATCH | Freq: Once | TRANSDERMAL | Status: DC
  Administered 2023-01-25: 21 mg via TRANSDERMAL
  Filled 2023-01-25: qty 1
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
## 2023-01-25 NOTE — Progress Notes (Signed)
Pt was accepted to [facility] TODAY 01/25/2023, pending negative covid faxed to [phone]  Pt meets inpatient criteria per Liborio Nixon, NP  Attending Physician will be Joylene Igo, AGPCNP  Report can be called to: [phone] (please wait to call report until transport is ready to take pt)  Pt can arrive after negative covid is received; bed is ready now  Care Team Notified: Liborio Nixon, NP, Marylene Land, RN, Tacy Learn, RN, and Melynda Ripple, Counselor  [place], [place]  01/25/2023 2:11 PM

## 2023-01-25 NOTE — ED Provider Notes (Signed)
Pt is stable for transfer. Vitals:   01/25/23 0942  BP: 136/86  Pulse: 76  Resp: 18  Temp: 98.1 F (36.7 C)  SpO2: 95%      Derwood Kaplan, MD 01/25/23 1708

## 2023-01-25 NOTE — Discharge Instructions (Signed)
Patient can be transported to [facility].  Nurse practitioner Aundra Millet Oxentine excepting

## 2023-01-25 NOTE — ED Notes (Signed)
Security aware to call transport for pt to [facility].

## 2023-01-25 NOTE — ED Provider Notes (Signed)
[facility] EMERGENCY DEPARTMENT AT [facility] Provider Note   CSN: [id number] Arrival date & time: 01/25/23  0915     History  Chief Complaint  Patient presents with   hearing voices    Jim Dunn is a 57 y.o. male.  Patient has a history of hallucinations.  He states that he is hearing voices telling himself to kill himself.   Altered Mental Status Presenting symptoms: behavior changes   Severity:  Severe Most recent episode:  Today Episode history:  Continuous Timing:  Constant Progression:  Worsening Chronicity:  New Context: not alcohol use   Associated symptoms: hallucinations   Associated symptoms: no abdominal pain, no headaches, no rash and no seizures        Home Medications Prior to Admission medications   Medication Sig Start Date End Date Taking? Authorizing Provider  ALPRAZolam Prudy Feeler) 1 MG tablet Take 1 tablet (1 mg total) by mouth 2 (two) times daily. 04/06/22  Yes Tyrone Nine, MD  amLODipine (NORVASC) 10 MG tablet Take 1 tablet (10 mg total) by mouth daily. 04/06/22  Yes Tyrone Nine, MD  atorvastatin (LIPITOR) 40 MG tablet Take 1 tablet (40 mg total) by mouth daily. 04/06/22  Yes Tyrone Nine, MD  busPIRone (BUSPAR) 10 MG tablet Take 1 tablet (10 mg total) by mouth 3 (three) times daily. Patient taking differently: Take 15 mg by mouth 3 (three) times daily. 04/06/22  Yes Tyrone Nine, MD  carvedilol (COREG) 25 MG tablet Take 1 tablet (25 mg total) by mouth 2 (two) times daily with a meal. 04/06/22  Yes Tyrone Nine, MD  digoxin (LANOXIN) 0.25 MG tablet Take 1 tablet (0.25 mg total) by mouth daily. 04/06/22  Yes Tyrone Nine, MD  divalproex (DEPAKOTE ER) 500 MG 24 hr tablet Take 1 tablet (500 mg total) by mouth 2 (two) times daily. 04/06/22  Yes Tyrone Nine, MD  melatonin 3 MG TABS tablet Take 1 tablet (3 mg total) by mouth at bedtime. 04/06/22  Yes Tyrone Nine, MD  mirtazapine (REMERON) 15 MG tablet Take 1 tablet (15 mg total) by mouth at  bedtime. 04/06/22  Yes Tyrone Nine, MD  paliperidone (INVEGA SUSTENNA) 234 MG/1.5ML injection Inject 234 mg into the muscle every 30 (thirty) days.   Yes [provider]  polyethylene glycol powder (GLYCOLAX/MIRALAX) 17 GM/SCOOP powder Dissolve 17 gm in liquid and take by mouth 2 (two) times daily. 04/06/22  Yes Tyrone Nine, MD  pregabalin (LYRICA) 75 MG capsule Take 1 capsule (75 mg total) by mouth 2 (two) times daily. Patient taking differently: Take 150 mg by mouth 2 (two) times daily. 04/06/22  Yes Tyrone Nine, MD  QUEtiapine (SEROQUEL) 50 MG tablet Take 1 tablet (50 mg total) by mouth at bedtime. 04/06/22  Yes Tyrone Nine, MD  sacubitril-valsartan (ENTRESTO) 97-103 MG Take 1 tablet by mouth 2 (two) times daily.   Yes [provider]  senna-docusate (SENOKOT-S) 8.6-50 MG tablet Take 2 tablets by mouth 2 (two) times daily. 04/06/22  Yes Tyrone Nine, MD  sodium chloride 1 g tablet Take 1 tablet (1 g total) by mouth 3 (three) times daily with meals. 04/06/22  Yes Tyrone Nine, MD      Allergies    Patient has no known allergies.    Review of Systems   Review of Systems  Constitutional:  Negative for appetite change and fatigue.  HENT:  Negative for congestion, ear discharge and  sinus pressure.   Eyes:  Negative for discharge.  Respiratory:  Negative for cough.   Cardiovascular:  Negative for chest pain.  Gastrointestinal:  Negative for abdominal pain and diarrhea.  Genitourinary:  Negative for frequency and hematuria.  Musculoskeletal:  Negative for back pain.  Skin:  Negative for rash.  Neurological:  Negative for seizures and headaches.  Psychiatric/Behavioral:  Positive for hallucinations.     Physical Exam Updated Vital Signs BP 136/86   Pulse 76   Temp 98.1 F (36.7 C) (Oral)   Resp 18   SpO2 95%  Physical Exam Vitals and nursing note reviewed.  Constitutional:      Appearance: He is well-developed.  HENT:     Head: Normocephalic.     Nose: Nose  normal.  Eyes:     General: No scleral icterus.    Conjunctiva/sclera: Conjunctivae normal.  Neck:     Thyroid: No thyromegaly.  Cardiovascular:     Rate and Rhythm: Normal rate and regular rhythm.     Heart sounds: No murmur heard.    No friction rub. No gallop.  Pulmonary:     Breath sounds: No stridor. No wheezing or rales.  Chest:     Chest wall: No tenderness.  Abdominal:     General: There is no distension.     Tenderness: There is no abdominal tenderness. There is no rebound.  Musculoskeletal:        General: Normal range of motion.     Cervical back: Neck supple.  Lymphadenopathy:     Cervical: No cervical adenopathy.  Skin:    Findings: No erythema or rash.  Neurological:     Mental Status: He is alert and oriented to person, place, and time.     Motor: No abnormal muscle tone.     Coordination: Coordination normal.  Psychiatric:     Comments: Patient with auditory hallucinations and suicidal ideation     ED Results / Procedures / Treatments   Labs (all labs ordered are listed, but only abnormal results are displayed) Labs Reviewed  CBC WITH DIFFERENTIAL/PLATELET - Abnormal; Notable for the following components:      Result Value   RBC 6.00 (*)    MCV 67.8 (*)    MCH 22.5 (*)    RDW 17.2 (*)    All other components within normal limits  COMPREHENSIVE METABOLIC PANEL - Abnormal; Notable for the following components:   Sodium 127 (*)    Potassium 3.3 (*)    Chloride 93 (*)    Glucose, Bld 100 (*)    BUN 5 (*)    Creatinine, Ser 0.54 (*)    All other components within normal limits  DIGOXIN LEVEL - Abnormal; Notable for the following components:   Digoxin Level 0.7 (*)    All other components within normal limits  SARS CORONAVIRUS 2 BY RT PCR  RAPID URINE DRUG SCREEN, HOSP PERFORMED  ETHANOL    EKG None  Radiology No results found.  Procedures Procedures    Medications Ordered in ED Medications  nicotine (NICODERM CQ - dosed in mg/24  hours) patch 21 mg (21 mg Transdermal Patch Applied 01/25/23 1413)  sodium chloride 0.9 % bolus 1,000 mL (0 mLs Intravenous Stopped 01/25/23 1435)    ED Course/ Medical Decision Making/ A&P                             Medical Decision Making Amount and/or Complexity  of Data Reviewed Labs: ordered. ECG/medicine tests: ordered.  This patient presents to the ED for concern of hearing voices and suicidal, this involves an extensive number of treatment options, and is a complaint that carries with it a high risk of complications and morbidity.  The differential diagnosis includes schizophrenia and substance abuse   Co morbidities that complicate the patient evaluation  History of auditory hallucinations   Additional history obtained:  Additional history obtained from patient External records from outside source obtained and reviewed including hospital records   Lab Tests:  I Ordered, and personally interpreted labs.  The pertinent results include: White count normal, sodium 127   Imaging Studies ordered:  No imaging Cardiac Monitoring: / EKG:  The patient was maintained on a cardiac monitor.  I personally viewed and interpreted the cardiac monitored which showed an underlying rhythm of: Normal sinus rhythm   Consultations Obtained:  I requested consultation with the behavioral health,  and discussed lab and imaging findings as well as pertinent plan - they recommend: Admission to psych   Problem List / ED Course / Critical interventions / Medication management  Hallucinations no medicines ordered Reevaluation of the patient after these medicines showed that the patient stayed the same I have reviewed the patients home medicines and have made adjustments as needed   Social Determinants of Health:  None   Test / Admission - Considered:  None    Patient with auditory hallucinations and suicidal ideations.  He is going to be transferred to [facility]        Final Clinical Impression(s) / ED Diagnoses Final diagnoses:  None    Rx / DC Orders ED Discharge Orders     None         Bethann Berkshire, MD 01/25/23 1724

## 2023-01-25 NOTE — ED Notes (Signed)
Pts legal guardian took phone.

## 2023-01-25 NOTE — ED Notes (Signed)
Pt continues to get out of bed and try to go outside. Pt given nicotine patch and redirected to bed.

## 2023-01-25 NOTE — ED Notes (Signed)
Legal Guardian aware of pt leaving for [facility] with belongings.

## 2023-01-25 NOTE — Progress Notes (Signed)
LCSW Progress Note  [id number]   Jim Dunn  01/25/2023  1:05 PM  Description:   Inpatient Psychiatric Referral  Patient was recommended inpatient per Liborio Nixon, NP. There are no available beds at [facility], per [facility], RN. Patient was referred to the following out of network facilities:   [facility] Provider Address Phone Fax  [facility]  [address], [place] [address] [phone]  [facility]  [address], [place] [address] [phone]  [facility]  [address] [place], [place] [address] [phone]  [facility] [address] [place]  [address] [place] [address] [phone]  [facility]  [address], [place] [address] [phone]  [facility]  [phone] [place] [address] [phone]  [facility]  [address] [place], [place] [address] [phone]  [facility]  [address] [place] [address] [phone]  [facility]  [address] [place] [address] [phone]  [facility]  [address] [place] [address] [phone]  [facility]  [address], [place] [address] [phone]  [facility]  [address] [place] [address] [phone]  [facility]  [address], [place] [address] [phone]  [facility]  [address] [place] [address] [phone]  [facility]  [address], [place] [address] [phone]  [facility] Office  [address] [place] [address] [phone]  [facility]  [address], [place] [address] [phone]  [facility]  [address], [place] [address] [phone]  [facility]  [address], [place] [address] [phone]  [facility]  [address] [place] [address] [phone]  [facility]  [address] [place] [address] [phone]  [facility]  [address] [place] [address] [phone]  [facility]  [address] [place], [place] [address] [phone]  [facility] [address] [place] [address] [phone]  [facility]  [address] [place] [address] [phone]  [facility]  [address] [place] [address] [phone]  [facility]  [address], [place] [address] [phone]  [facility]  [address] [place] [address] [phone]  [facility]  [address] [place] [address] [phone]    Situation ongoing, CSW to continue following and update chart as more information becomes available.      Cathie Beams, LCSW  01/25/2023 1:05 PM

## 2023-01-25 NOTE — ED Notes (Signed)
Legal guardian Helmut Muster made aware of pts acceptance to [facility].

## 2023-01-25 NOTE — ED Triage Notes (Signed)
Pt bib caregiver from an assisted living facility. Pt states his mind is racing. Caregiver states that pt stated he was hearing voices, has been up over 24 hrs. Pt states he can't control his mind.

## 2023-01-25 NOTE — ED Notes (Addendum)
Patient's caregiver did note that patient does have a history of "low sodium levels" and has had seizures related to that.  Patient resting in bed, patient is calm, states he does not want to hurt himself or anyone else.

## 2023-01-25 NOTE — BH Assessment (Signed)
@  1043, requested [place], RN and [place], RN to set up the TTS machine for patient's initial TTS assessment.

## 2023-01-25 NOTE — ED Notes (Signed)
Pt belongings in a bag. Security wanded pt.

## 2024-01-19 ENCOUNTER — Inpatient Hospital Stay (HOSPITAL_COMMUNITY)
Admission: EM | Admit: 2024-01-19 | Discharge: 2024-02-01 | DRG: 194 | Disposition: A | Discharge status: Skilled Nursing Facility | Attending: Family Medicine | Admitting: Family Medicine

## 2024-01-19 ENCOUNTER — Emergency Department (HOSPITAL_COMMUNITY)

## 2024-01-19 ENCOUNTER — Encounter (HOSPITAL_COMMUNITY): Payer: Self-pay | Admitting: Emergency Medicine

## 2024-01-19 ENCOUNTER — Other Ambulatory Visit: Payer: Self-pay

## 2024-01-19 DIAGNOSIS — Z79899 Other long term (current) drug therapy: Secondary | ICD-10-CM

## 2024-01-19 DIAGNOSIS — B9562 Methicillin resistant Staphylococcus aureus infection as the cause of diseases classified elsewhere: Secondary | ICD-10-CM | POA: Diagnosis present

## 2024-01-19 DIAGNOSIS — J431 Panlobular emphysema: Secondary | ICD-10-CM | POA: Diagnosis not present

## 2024-01-19 DIAGNOSIS — I5042 Chronic combined systolic (congestive) and diastolic (congestive) heart failure: Secondary | ICD-10-CM | POA: Diagnosis present

## 2024-01-19 DIAGNOSIS — Z87442 Personal history of urinary calculi: Secondary | ICD-10-CM

## 2024-01-19 DIAGNOSIS — F17210 Nicotine dependence, cigarettes, uncomplicated: Secondary | ICD-10-CM | POA: Diagnosis present

## 2024-01-19 DIAGNOSIS — D509 Iron deficiency anemia, unspecified: Secondary | ICD-10-CM | POA: Diagnosis present

## 2024-01-19 DIAGNOSIS — I5022 Chronic systolic (congestive) heart failure: Secondary | ICD-10-CM | POA: Diagnosis present

## 2024-01-19 DIAGNOSIS — I10 Essential (primary) hypertension: Secondary | ICD-10-CM | POA: Diagnosis present

## 2024-01-19 DIAGNOSIS — J189 Pneumonia, unspecified organism: Secondary | ICD-10-CM | POA: Diagnosis not present

## 2024-01-19 DIAGNOSIS — Z6841 Body Mass Index (BMI) 40.0 and over, adult: Secondary | ICD-10-CM

## 2024-01-19 DIAGNOSIS — E871 Hypo-osmolality and hyponatremia: Principal | ICD-10-CM | POA: Diagnosis present

## 2024-01-19 DIAGNOSIS — I358 Other nonrheumatic aortic valve disorders: Secondary | ICD-10-CM | POA: Diagnosis not present

## 2024-01-19 DIAGNOSIS — I110 Hypertensive heart disease with heart failure: Secondary | ICD-10-CM | POA: Diagnosis present

## 2024-01-19 DIAGNOSIS — J440 Chronic obstructive pulmonary disease with acute lower respiratory infection: Secondary | ICD-10-CM | POA: Diagnosis present

## 2024-01-19 DIAGNOSIS — J159 Unspecified bacterial pneumonia: Principal | ICD-10-CM | POA: Diagnosis present

## 2024-01-19 DIAGNOSIS — F32A Depression, unspecified: Secondary | ICD-10-CM | POA: Diagnosis present

## 2024-01-19 DIAGNOSIS — I5023 Acute on chronic systolic (congestive) heart failure: Secondary | ICD-10-CM | POA: Diagnosis not present

## 2024-01-19 DIAGNOSIS — I739 Peripheral vascular disease, unspecified: Secondary | ICD-10-CM | POA: Diagnosis present

## 2024-01-19 DIAGNOSIS — R7881 Bacteremia: Secondary | ICD-10-CM

## 2024-01-19 DIAGNOSIS — E782 Mixed hyperlipidemia: Secondary | ICD-10-CM | POA: Diagnosis present

## 2024-01-19 DIAGNOSIS — E222 Syndrome of inappropriate secretion of antidiuretic hormone: Secondary | ICD-10-CM | POA: Diagnosis present

## 2024-01-19 DIAGNOSIS — J449 Chronic obstructive pulmonary disease, unspecified: Secondary | ICD-10-CM | POA: Diagnosis present

## 2024-01-19 DIAGNOSIS — G40909 Epilepsy, unspecified, not intractable, without status epilepticus: Secondary | ICD-10-CM

## 2024-01-19 DIAGNOSIS — E66813 Obesity, class 3: Secondary | ICD-10-CM | POA: Diagnosis present

## 2024-01-19 HISTORY — DX: Pneumonia, unspecified organism: J18.9

## 2024-01-19 HISTORY — DX: Essential (primary) hypertension: I10

## 2024-01-19 LAB — COMPREHENSIVE METABOLIC PANEL WITH GFR
ALT: 8 U/L (ref 0–44)
AST: 24 U/L (ref 15–41)
Albumin: 3.5 g/dL (ref 3.5–5.0)
Alkaline Phosphatase: 67 U/L (ref 38–126)
Anion gap: 10 (ref 5–15)
BUN: 8 mg/dL (ref 6–20)
CO2: 27 mmol/L (ref 22–32)
Calcium: 8.5 mg/dL — ABNORMAL LOW (ref 8.9–10.3)
Chloride: 86 mmol/L — ABNORMAL LOW (ref 98–111)
Creatinine, Ser: 0.85 mg/dL (ref 0.61–1.24)
GFR, Estimated: >60 mL/min (ref >60)
Glucose, Bld: 131 mg/dL — ABNORMAL HIGH (ref 70–99)
Potassium: 4.8 mmol/L (ref 3.5–5.1)
Sodium: 123 mmol/L — ABNORMAL LOW (ref 135–145)
Total Bilirubin: 1.2 mg/dL (ref 0.0–1.2)
Total Protein: 7.3 g/dL (ref 6.5–8.1)

## 2024-01-19 LAB — CBC
HCT: 33.2 % — ABNORMAL LOW (ref 39.0–52.0)
Hemoglobin: 11.1 g/dL — ABNORMAL LOW (ref 13.0–17.0)
MCH: 23 pg — ABNORMAL LOW (ref 26.0–34.0)
MCHC: 33.4 g/dL (ref 30.0–36.0)
MCV: 68.7 fL — ABNORMAL LOW (ref 80.0–100.0)
Platelets: 213 10*3/uL (ref 150–400)
RBC: 4.83 MIL/uL (ref 4.22–5.81)
RDW: 15.9 % — ABNORMAL HIGH (ref 11.5–15.5)
WBC: 8.5 10*3/uL (ref 4.0–10.5)
nRBC: 0 % (ref 0.0–0.2)

## 2024-01-19 LAB — TROPONIN I (HIGH SENSITIVITY): Troponin I (High Sensitivity): 5 ng/L (ref <18)

## 2024-01-19 MED ORDER — SODIUM CHLORIDE 0.9 % IV SOLN
500.0000 mg | Freq: Once | INTRAVENOUS | Status: AC
  Administered 2024-01-20: 500 mg via INTRAVENOUS
  Filled 2024-01-19: qty 5

## 2024-01-19 MED ORDER — SODIUM CHLORIDE 0.9 % IV SOLN
1.0000 g | Freq: Once | INTRAVENOUS | Status: AC
  Administered 2024-01-19: 1 g via INTRAVENOUS
  Filled 2024-01-19: qty 10

## 2024-01-19 MED ORDER — LIDOCAINE 5 % EX PTCH
1.0000 | MEDICATED_PATCH | CUTANEOUS | Status: DC
  Administered 2024-01-19 – 2024-01-30 (×9): 1 via TRANSDERMAL
  Filled 2024-01-19 (×12): qty 1

## 2024-01-19 MED ORDER — IPRATROPIUM-ALBUTEROL 0.5-2.5 (3) MG/3ML IN SOLN
3.0000 mL | RESPIRATORY_TRACT | Status: AC
  Administered 2024-01-19 (×3): 3 mL via RESPIRATORY_TRACT
  Filled 2024-01-19: qty 9

## 2024-01-19 MED ORDER — SODIUM CHLORIDE 0.9 % IV BOLUS
1000.0000 mL | Freq: Once | INTRAVENOUS | Status: AC
  Administered 2024-01-19: 1000 mL via INTRAVENOUS

## 2024-01-19 MED ORDER — MORPHINE SULFATE (PF) 4 MG/ML IV SOLN
4.0000 mg | Freq: Once | INTRAVENOUS | Status: AC
  Administered 2024-01-19: 4 mg via INTRAVENOUS
  Filled 2024-01-19: qty 1

## 2024-01-19 NOTE — ED Provider Notes (Signed)
 [place] EMERGENCY DEPARTMENT AT [facility] Provider Note   CSN: [id number] Arrival date & time: 01/19/24  2217     History  No chief complaint on file.   Jim Dunn is a 58 y.o. male.  Patient with history of CHF, COPD, kidney stones, schizophrenia, tobacco abuse presents today with complaints of chest pain. He states that same began when he woke up this morning. Pain is in the right side of the chest and does not radiate. It is sharp, is not pleuritic. Does have a cough which he states is similar to his baseline which is attributed to his COPD, however is a bit more productive than normal. He continues to smoke. Denies shortness of breath. No nausea, vomiting, or diarrhea. No urinary symptoms. Denies history of similar pain previously.   The history is provided by the patient. No language interpreter was used.       Home Medications Prior to Admission medications   Medication Sig Start Date End Date Taking? Authorizing Provider  ALPRAZolam  (XANAX ) 1 MG tablet Take 1 tablet (1 mg total) by mouth 2 (two) times daily. 04/06/22   [name], MD  amLODipine  (NORVASC ) 10 MG tablet Take 1 tablet (10 mg total) by mouth daily. 04/06/22   [name], MD  atorvastatin  (LIPITOR) 40 MG tablet Take 1 tablet (40 mg total) by mouth daily. 04/06/22   [name], MD  busPIRone  (BUSPAR ) 10 MG tablet Take 1 tablet (10 mg total) by mouth 3 (three) times daily. Patient taking differently: Take 15 mg by mouth 3 (three) times daily. 04/06/22   [name], MD  carvedilol  (COREG ) 25 MG tablet Take 1 tablet (25 mg total) by mouth 2 (two) times daily with a meal. 04/06/22   [name], MD  digoxin  (LANOXIN ) 0.25 MG tablet Take 1 tablet (0.25 mg total) by mouth daily. 04/06/22   [name], MD  divalproex  (DEPAKOTE  ER) 500 MG 24 hr tablet Take 1 tablet (500 mg total) by mouth 2 (two) times daily. 04/06/22   [name], MD  melatonin 3 MG TABS tablet Take 1 tablet (3 mg total) by mouth at  bedtime. 04/06/22   [name], MD  mirtazapine  (REMERON ) 15 MG tablet Take 1 tablet (15 mg total) by mouth at bedtime. 04/06/22   [name], MD  paliperidone  (INVEGA  SUSTENNA) 234 MG/1.5ML injection Inject 234 mg into the muscle every 30 (thirty) days.    [provider]  polyethylene glycol powder (GLYCOLAX /MIRALAX ) 17 GM/SCOOP powder Dissolve 17 gm in liquid and take by mouth 2 (two) times daily. 04/06/22   [name], MD  pregabalin  (LYRICA ) 75 MG capsule Take 1 capsule (75 mg total) by mouth 2 (two) times daily. Patient taking differently: Take 150 mg by mouth 2 (two) times daily. 04/06/22   [name], MD  QUEtiapine  (SEROQUEL ) 50 MG tablet Take 1 tablet (50 mg total) by mouth at bedtime. 04/06/22   [name], MD  sacubitril -valsartan  (ENTRESTO ) 97-103 MG Take 1 tablet by mouth 2 (two) times daily.    [provider]  senna-docusate (SENOKOT-S) 8.6-50 MG tablet Take 2 tablets by mouth 2 (two) times daily. 04/06/22   [name], MD  sodium chloride  1 g tablet Take 1 tablet (1 g total) by mouth 3 (three) times daily with meals. 04/06/22   [name], MD      Allergies    Patient has no known allergies.  Review of Systems   Review of Systems  Cardiovascular:  Positive for chest pain.  All other systems reviewed and are negative.   Physical Exam Updated Vital Signs BP 139/74 (BP Location: Right Arm)   Pulse 74   Temp 98.1 F (36.7 C) (Oral)   Resp (!) 21   Ht 5\' 8"  (1.727 m)   Wt 127 kg   SpO2 95%   BMI 42.57 kg/m  Physical Exam Vitals and nursing note reviewed.  Constitutional:      General: He is not in acute distress.    Appearance: Normal appearance. He is normal weight. He is not ill-appearing, toxic-appearing or diaphoretic.  HENT:     Head: Normocephalic and atraumatic.  Cardiovascular:     Rate and Rhythm: Normal rate.  Pulmonary:     Effort: Pulmonary effort is normal. No respiratory distress.     Breath sounds: Wheezing present.      Comments: Expiratory wheezing present in bilateral lung bases Chest:    Musculoskeletal:        General: Normal range of motion.     Cervical back: Normal range of motion.     Right lower leg: No edema.     Left lower leg: No edema.  Skin:    General: Skin is warm and dry.  Neurological:     General: No focal deficit present.     Mental Status: He is alert.  Psychiatric:        Mood and Affect: Mood normal.        Behavior: Behavior normal.     ED Results / Procedures / Treatments   Labs (all labs ordered are listed, but only abnormal results are displayed) Labs Reviewed  CBC - Abnormal; Notable for the following components:      Result Value   Hemoglobin 11.1 (*)    HCT 33.2 (*)    MCV 68.7 (*)    MCH 23.0 (*)    RDW 15.9 (*)    All other components within normal limits  COMPREHENSIVE METABOLIC PANEL WITH GFR - Abnormal; Notable for the following components:   Sodium 123 (*)    Chloride 86 (*)    Glucose, Bld 131 (*)    Calcium  8.5 (*)    All other components within normal limits  TROPONIN I (HIGH SENSITIVITY)    EKG None  Radiology DG Chest 2 View Result Date: 01/19/2024 CLINICAL DATA:  Chest pain EXAM: CHEST - 2 VIEW COMPARISON:  Chest x-ray 07/27/2023 FINDINGS: The heart is enlarged. There is central pulmonary vascular congestion. There are mild patchy airspace opacities in the bilateral lower lungs. There is no pleural effusion or pneumothorax. No acute fractures are seen. IMPRESSION: 1. Cardiomegaly with central pulmonary vascular congestion. 2. Mild patchy airspace opacities in the bilateral lower lungs may represent edema or infection. Electronically Signed   By: Tyron Gallon M.D.   On: 01/19/2024 23:03    Procedures Procedures    Medications Ordered in ED Medications  ipratropium-albuterol  (DUONEB) 0.5-2.5 (3) MG/3ML nebulizer solution 3 mL (has no administration in time range)    ED Course/ Medical Decision Making/ A&P                                  Medical Decision Making Amount and/or Complexity of Data Reviewed Labs: ordered. Radiology: ordered.  Risk Prescription drug management. Decision regarding hospitalization.   This patient is a 58 y.o.  male who presents to the ED for concern of chest pain, this involves an extensive number of treatment options, and is a complaint that carries with it a high risk of complications and morbidity. The emergent differential diagnosis prior to evaluation includes, but is not limited to,  ACS, pericarditis, myocarditis, aortic dissection, PE, pneumothorax, esophageal rupture, pneumonia, reflux/PUD, biliary disease, pancreatitis, costochondritis, anxiety   This is not an exhaustive differential.   Past Medical History / Co-morbidities / Social History:  has a past medical history of CHF (congestive heart failure) (HCC), COPD (chronic obstructive pulmonary disease) (HCC), History of kidney stones, Hyponatremia, Paranoid schizophrenia (HCC), Seizures (HCC), and Tobacco abuse.  Additional history: Chart reviewed.  Physical Exam: Physical exam performed. The pertinent findings include: TTP right chest with no overlying skin changes or deformity.  Lab Tests: I ordered, and personally interpreted labs.  The pertinent results include:  hgb 11.1. Na 123, chloride 86. Troponin WNL   Imaging Studies: I ordered imaging studies including DG chest. I independently visualized and interpreted imaging which showed   1. Cardiomegaly with central pulmonary vascular congestion. 2. Mild patchy airspace opacities in the bilateral lower lungs may represent edema or infection.  I agree with the radiologist interpretation.   Medications: I ordered medication including morphine , lidocaine  patch, rocephin , azithromycin , fluids  for pain, hyponatremia, pain, pneumonia. Duonebs for wheezing. Reevaluation of the patient after these medicines showed that the patient stayed the same. I have reviewed the patients  home medicines and have made adjustments as needed.   Disposition: After consideration of the diagnostic results and the patients response to treatment, I feel that patient will require admission for hyponatremia and bilateral pneumonia. Pneumonia likely cause of pain.  Discussed same with patient is understanding and in agreement with this.  He is requesting admission.   I discussed this case with my attending physician Dr. Annabell Key who cosigned this note including patient's presenting symptoms, physical exam, and planned diagnostics and interventions. Attending physician stated agreement with plan or made changes to plan which were implemented.    Final Clinical Impression(s) / ED Diagnoses Final diagnoses:  Hyponatremia  Pneumonia of both lungs due to infectious organism, unspecified part of lung    Rx / DC Orders ED Discharge Orders     None         Fredna Jasper 01/21/24 Jaquelyn Merino    Early Glisson, MD 01/22/24 [phone]

## 2024-01-19 NOTE — H&P (Signed)
 History and Physical    Patient: Jim Dunn ZOX:[id number] DOB: [birth year] DOA: 01/19/2024 DOS: the patient was seen and examined on 01/20/2024 PCP: System, Provider Not In  Patient coming from: Home  Chief Complaint: Chest pain  HPI: Jim Dunn is a 58 y.o. male with medical history significant of COPD, CHF, seizure disorder, pulm vascular failure, tobacco abuse who presents emergency department due to right-sided chest pain that woke him up this morning.  Pain was sharp, pleuritic, nonradiating and was associated with productive cough.  Patient continues to smoke.  He denies nausea, vomiting, shortness of breath.  ED Course:  In the emergency department, he was hemodynamically stable.  Workup in the ED showed microcytic anemia.  BMP was normal except for hyponatremia (123), chloride 86, glucose 131.  Troponin times was flat at 5. Chest x-ray showed cardiomegaly with central pulmonary vascular congestion.  Mild patchy airspace opacities in the bilateral lower lungs may represent edema or infection. Patient was treated with IV ceftriaxone and azithromycin , breathing treatment was provided, IV morphine  4 mg x 1 was given, IV hydration was provided.  TRH was asked to admit patient.  Review of Systems: Review of systems as noted in the HPI. All other systems reviewed and are negative.   Past Medical History:  Diagnosis Date   CHF (congestive heart failure) (HCC)    COPD (chronic obstructive pulmonary disease) (HCC)    History of kidney stones    Hyponatremia    Paranoid schizophrenia (HCC)    Seizures (HCC)    Tobacco abuse    Past Surgical History:  Procedure Laterality Date   KIDNEY STONE SURGERY     MULTIPLE EXTRACTIONS WITH ALVEOLOPLASTY Bilateral 04/29/2020   Procedure: Extraction of tooth #'s 2-13, 17,18, 21-25, and 27-31 with alveoloplasty and bilateral lingual exostoses reductions.;  Surgeon: Carol Chroman, DDS;  Location: [facility] OR;  Service: Oral Surgery;  Laterality:  Bilateral;    Social History:  reports that he has been smoking cigarettes. He has quit using smokeless tobacco. He reports that he does not currently use alcohol. He reports that he does not use drugs.   No Known Allergies  Family History  Problem Relation Age of Onset   Heart disease Other      Prior to Admission medications   Medication Sig Start Date End Date Taking? Authorizing Provider  ALPRAZolam  (XANAX ) 1 MG tablet Take 1 tablet (1 mg total) by mouth 2 (two) times daily. 04/06/22   [name], MD  amLODipine  (NORVASC ) 10 MG tablet Take 1 tablet (10 mg total) by mouth daily. 04/06/22   [name], MD  atorvastatin  (LIPITOR) 40 MG tablet Take 1 tablet (40 mg total) by mouth daily. 04/06/22   [name], MD  busPIRone  (BUSPAR ) 10 MG tablet Take 1 tablet (10 mg total) by mouth 3 (three) times daily. Patient taking differently: Take 15 mg by mouth 3 (three) times daily. 04/06/22   [name], MD  carvedilol  (COREG ) 25 MG tablet Take 1 tablet (25 mg total) by mouth 2 (two) times daily with a meal. 04/06/22   [name], MD  digoxin  (LANOXIN ) 0.25 MG tablet Take 1 tablet (0.25 mg total) by mouth daily. 04/06/22   [name], MD  divalproex  (DEPAKOTE  ER) 500 MG 24 hr tablet Take 1 tablet (500 mg total) by mouth 2 (two) times daily. 04/06/22   [name], MD  melatonin 3 MG TABS tablet Take 1 tablet (3 mg total) by mouth  at bedtime. 04/06/22   [name], MD  mirtazapine  (REMERON ) 15 MG tablet Take 1 tablet (15 mg total) by mouth at bedtime. 04/06/22   [name], MD  paliperidone  (INVEGA  SUSTENNA) 234 MG/1.5ML injection Inject 234 mg into the muscle every 30 (thirty) days.    [provider]  polyethylene glycol powder (GLYCOLAX /MIRALAX ) 17 GM/SCOOP powder Dissolve 17 gm in liquid and take by mouth 2 (two) times daily. 04/06/22   [name], MD  pregabalin  (LYRICA ) 75 MG capsule Take 1 capsule (75 mg total) by mouth 2 (two) times daily. Patient taking differently: Take  150 mg by mouth 2 (two) times daily. 04/06/22   [name], MD  QUEtiapine  (SEROQUEL ) 50 MG tablet Take 1 tablet (50 mg total) by mouth at bedtime. 04/06/22   [name], MD  sacubitril -valsartan  (ENTRESTO ) 97-103 MG Take 1 tablet by mouth 2 (two) times daily.    [provider]  senna-docusate (SENOKOT-S) 8.6-50 MG tablet Take 2 tablets by mouth 2 (two) times daily. 04/06/22   [name], MD  sodium chloride  1 g tablet Take 1 tablet (1 g total) by mouth 3 (three) times daily with meals. 04/06/22   [name], MD    Physical Exam: BP 127/75 (BP Location: Right Arm)   Pulse 68   Temp 97.7 F (36.5 C)   Resp 20   Ht 5\' 8"  (1.727 m)   Wt 121.3 kg   SpO2 96%   BMI 40.66 kg/m   General: 58 y.o. year-old male well developed well nourished in no acute distress.  Alert and oriented x3. HEENT: NCAT, EOMI Neck: Supple, trachea medial Cardiovascular: Regular rate and rhythm with no rubs or gallops.  No thyromegaly or JVD noted.  No lower extremity edema. 2/4 pulses in all 4 extremities. Respiratory: Diffuse coarse breath sounds with minimal scattered expiratory wheezing.  Abdomen: Soft, nontender nondistended with normal bowel sounds x4 quadrants. Muskuloskeletal: No cyanosis, clubbing or edema noted bilaterally Neuro: CN II-XII intact, strength 5/5 x 4, sensation, reflexes intact Skin: No ulcerative lesions noted or rashes Psychiatry: Judgement and insight appear normal. Mood is appropriate for condition and setting          Labs on Admission:  Basic Metabolic Panel: Recent Labs  Lab 01/19/24 2235  NA 123*  K 4.8  CL 86*  CO2 27  GLUCOSE 131*  BUN 8  CREATININE 0.85  CALCIUM  8.5*   Liver Function Tests: Recent Labs  Lab 01/19/24 2235  AST 24  ALT 8  ALKPHOS 67  BILITOT 1.2  PROT 7.3  ALBUMIN 3.5   No results for input(s): "LIPASE", "AMYLASE" in the last 168 hours. No results for input(s): "AMMONIA" in the last 168 hours. CBC: Recent Labs  Lab  01/19/24 2235  WBC 8.5  HGB 11.1*  HCT 33.2*  MCV 68.7*  PLT 213   Cardiac Enzymes: No results for input(s): "CKTOTAL", "CKMB", "CKMBINDEX", "TROPONINI" in the last 168 hours.  BNP (last 3 results) No results for input(s): "BNP" in the last 8760 hours.  ProBNP (last 3 results) No results for input(s): "PROBNP" in the last 8760 hours.  CBG: No results for input(s): "GLUCAP" in the last 168 hours.  Radiological Exams on Admission: DG Chest 2 View Result Date: 01/19/2024 CLINICAL DATA:  Chest pain EXAM: CHEST - 2 VIEW COMPARISON:  Chest x-ray 07/27/2023 FINDINGS: The heart is enlarged. There is central pulmonary vascular congestion. There are mild patchy airspace opacities in the  bilateral lower lungs. There is no pleural effusion or pneumothorax. No acute fractures are seen. IMPRESSION: 1. Cardiomegaly with central pulmonary vascular congestion. 2. Mild patchy airspace opacities in the bilateral lower lungs may represent edema or infection. Electronically Signed   By: Tyron Gallon M.D.   On: 01/19/2024 23:03    EKG: I independently viewed the EKG done and my findings are as followed: Normal sinus rhythm at a rate of 63 bpm  Assessment/Plan Present on Admission:  CAP (community acquired pneumonia)  COPD (chronic obstructive pulmonary disease) (HCC)  Hyponatremia  Microcytic anemia  Chronic systolic CHF (congestive heart failure) (HCC)  Mixed hyperlipidemia  Essential hypertension  Principal Problem:   CAP (community acquired pneumonia) Active Problems:   Seizure disorder (HCC)   Chronic systolic CHF (congestive heart failure) (HCC)   Mixed hyperlipidemia   COPD (chronic obstructive pulmonary disease) (HCC)   Microcytic anemia   Hyponatremia   Essential hypertension   Obesity, Class III, BMI 40-49.9 (morbid obesity)  CAP POA Chest x-ray was suggestive of pneumonia Patient was started on ceftriaxone and azithromycin , we shall continue same at this time with plan to  de-escalate/discontinue based on blood culture, sputum culture, urine Legionella, strep pneumo and procalcitonin Continue Tylenol  as needed Continue lidocaine  patch on right side of chest Continue Mucinex , incentive spirometry, flutter valve   COPD (not in acute exacerbation) Continue duo nebs, Mucinex  Continue incentive spirometry and flutter valve  Hyponatremia Na 123 Urine osmolality, serum osmolality and urine sodium will be checked  Microcytic anemia MCV 68.7, hemoglobin 11.1, hematocrit 33.2 Iron studies will be done  Morbid obesity (BMI 40.66) Patient was counseled about the cardiovascular and metabolic risk of morbid obesity. Patient was counseled for diet control, exercise regimen and weight loss.   Chronic systolic CHF Continue home meds  Mixed hyperlipidemia Continue statin  Essential hypertension Continue Norvasc , Coreg , Entresto   Seizure disorder Continue home meds   DVT prophylaxis: Lovenox    Code Status: Full code  Family Communication: None at bedside  Consults: None  Severity of Illness: The appropriate patient status for this patient is INPATIENT. Inpatient status is judged to be reasonable and necessary in order to provide the required intensity of service to ensure the patient's safety. The patient's presenting symptoms, physical exam findings, and initial radiographic and laboratory data in the context of their chronic comorbidities is felt to place them at high risk for further clinical deterioration. Furthermore, it is not anticipated that the patient will be medically stable for discharge from the hospital within 2 midnights of admission.   * I certify that at the point of admission it is my clinical judgment that the patient will require inpatient hospital care spanning beyond 2 midnights from the point of admission due to high intensity of service, high risk for further deterioration and high frequency of surveillance  required.*  Author: Aidden Markovic, DO 01/20/2024 5:48 AM  For on call review [email or web address].

## 2024-01-19 NOTE — ED Triage Notes (Signed)
 From [facility] arrived to ed via [facility]. C/o right flank pain that started this morning and progressively getting worse, spitting up phlegm. Pt able to walk.

## 2024-01-20 ENCOUNTER — Encounter (HOSPITAL_COMMUNITY): Payer: Self-pay | Admitting: Internal Medicine

## 2024-01-20 DIAGNOSIS — G40909 Epilepsy, unspecified, not intractable, without status epilepticus: Secondary | ICD-10-CM

## 2024-01-20 DIAGNOSIS — E871 Hypo-osmolality and hyponatremia: Secondary | ICD-10-CM | POA: Diagnosis not present

## 2024-01-20 DIAGNOSIS — J189 Pneumonia, unspecified organism: Secondary | ICD-10-CM | POA: Diagnosis not present

## 2024-01-20 DIAGNOSIS — I10 Essential (primary) hypertension: Secondary | ICD-10-CM

## 2024-01-20 DIAGNOSIS — E782 Mixed hyperlipidemia: Secondary | ICD-10-CM

## 2024-01-20 DIAGNOSIS — J431 Panlobular emphysema: Secondary | ICD-10-CM

## 2024-01-20 DIAGNOSIS — I5022 Chronic systolic (congestive) heart failure: Secondary | ICD-10-CM | POA: Diagnosis not present

## 2024-01-20 DIAGNOSIS — E66813 Obesity, class 3: Secondary | ICD-10-CM | POA: Diagnosis present

## 2024-01-20 LAB — CBC
HCT: 34.1 % — ABNORMAL LOW (ref 39.0–52.0)
Hemoglobin: 11.4 g/dL — ABNORMAL LOW (ref 13.0–17.0)
MCH: 23.1 pg — ABNORMAL LOW (ref 26.0–34.0)
MCHC: 33.4 g/dL (ref 30.0–36.0)
MCV: 69 fL — ABNORMAL LOW (ref 80.0–100.0)
Platelets: 211 10*3/uL (ref 150–400)
RBC: 4.94 MIL/uL (ref 4.22–5.81)
RDW: 16.2 % — ABNORMAL HIGH (ref 11.5–15.5)
WBC: 8.1 10*3/uL (ref 4.0–10.5)
nRBC: 0 % (ref 0.0–0.2)

## 2024-01-20 LAB — RESPIRATORY PANEL BY PCR

## 2024-01-20 LAB — IRON AND TIBC
Iron: 48 ug/dL (ref 45–182)
Saturation Ratios: 16 % — ABNORMAL LOW (ref 17.9–39.5)
TIBC: 300 ug/dL (ref 250–450)
UIBC: 252 ug/dL

## 2024-01-20 LAB — BRAIN NATRIURETIC PEPTIDE: B Natriuretic Peptide: 29 pg/mL (ref 0.0–100.0)

## 2024-01-20 LAB — OSMOLALITY, URINE: Osmolality, Ur: 204 mosm/kg — ABNORMAL LOW (ref 300–900)

## 2024-01-20 LAB — COMPREHENSIVE METABOLIC PANEL WITH GFR
ALT: 8 U/L (ref 0–44)
AST: 11 U/L — ABNORMAL LOW (ref 15–41)
Albumin: 3.3 g/dL — ABNORMAL LOW (ref 3.5–5.0)
Alkaline Phosphatase: 61 U/L (ref 38–126)
Anion gap: 8 (ref 5–15)
BUN: 8 mg/dL (ref 6–20)
CO2: 27 mmol/L (ref 22–32)
Calcium: 8.5 mg/dL — ABNORMAL LOW (ref 8.9–10.3)
Chloride: 94 mmol/L — ABNORMAL LOW (ref 98–111)
Creatinine, Ser: 0.6 mg/dL — ABNORMAL LOW (ref 0.61–1.24)
GFR, Estimated: >60 mL/min (ref >60)
Glucose, Bld: 90 mg/dL (ref 70–99)
Potassium: 3.5 mmol/L (ref 3.5–5.1)
Sodium: 129 mmol/L — ABNORMAL LOW (ref 135–145)
Total Bilirubin: 0.4 mg/dL (ref 0.0–1.2)
Total Protein: 7.1 g/dL (ref 6.5–8.1)

## 2024-01-20 LAB — PHOSPHORUS: Phosphorus: 3.5 mg/dL (ref 2.5–4.6)

## 2024-01-20 LAB — OSMOLALITY: Osmolality: 276 mosm/kg (ref 275–295)

## 2024-01-20 LAB — HIV ANTIBODY (ROUTINE TESTING W REFLEX): HIV Screen 4th Generation wRfx: NONREACTIVE

## 2024-01-20 LAB — STREP PNEUMONIAE URINARY ANTIGEN: Strep Pneumo Urinary Antigen: NEGATIVE

## 2024-01-20 LAB — TROPONIN I (HIGH SENSITIVITY): Troponin I (High Sensitivity): 5 ng/L (ref <18)

## 2024-01-20 LAB — MAGNESIUM: Magnesium: 1.9 mg/dL (ref 1.7–2.4)

## 2024-01-20 LAB — FERRITIN: Ferritin: 221 ng/mL (ref 24–336)

## 2024-01-20 LAB — PROCALCITONIN: Procalcitonin: 9.97 ng/mL

## 2024-01-20 LAB — SODIUM, URINE, RANDOM: Sodium, Ur: 80 mmol/L

## 2024-01-20 MED ORDER — DM-GUAIFENESIN ER 30-600 MG PO TB12
1.0000 | ORAL_TABLET | Freq: Two times a day (BID) | ORAL | Status: DC
  Administered 2024-01-20 – 2024-02-01 (×23): 1 via ORAL
  Filled 2024-01-20 (×24): qty 1

## 2024-01-20 MED ORDER — OXYCODONE-ACETAMINOPHEN 5-325 MG PO TABS
1.0000 | ORAL_TABLET | ORAL | Status: DC | PRN
  Administered 2024-01-20 – 2024-02-01 (×23): 1 via ORAL
  Filled 2024-01-20 (×23): qty 1

## 2024-01-20 MED ORDER — ALPRAZOLAM 0.5 MG PO TABS
0.5000 mg | ORAL_TABLET | Freq: Three times a day (TID) | ORAL | Status: DC | PRN
  Administered 2024-01-20 – 2024-02-01 (×28): 0.5 mg via ORAL
  Filled 2024-01-20 (×28): qty 1

## 2024-01-20 MED ORDER — ONDANSETRON HCL 4 MG PO TABS: 4.0000 mg | ORAL_TABLET | Freq: Four times a day (QID) | ORAL | Status: DC | PRN

## 2024-01-20 MED ORDER — ONDANSETRON HCL 4 MG/2ML IJ SOLN: 4.0000 mg | Freq: Four times a day (QID) | INTRAMUSCULAR | Status: DC | PRN

## 2024-01-20 MED ORDER — DIGOXIN 125 MCG PO TABS
0.2500 mg | ORAL_TABLET | Freq: Every day | ORAL | Status: DC
  Administered 2024-01-20 – 2024-02-01 (×11): 0.25 mg via ORAL
  Filled 2024-01-20 (×13): qty 2

## 2024-01-20 MED ORDER — ACETAMINOPHEN 650 MG RE SUPP: 650.0000 mg | Freq: Four times a day (QID) | RECTAL | Status: DC | PRN

## 2024-01-20 MED ORDER — CARVEDILOL 12.5 MG PO TABS
25.0000 mg | ORAL_TABLET | Freq: Two times a day (BID) | ORAL | Status: DC
  Administered 2024-01-20 – 2024-01-21 (×3): 25 mg via ORAL
  Filled 2024-01-20 (×3): qty 2

## 2024-01-20 MED ORDER — PREGABALIN 75 MG PO CAPS
75.0000 mg | ORAL_CAPSULE | Freq: Two times a day (BID) | ORAL | Status: DC
  Administered 2024-01-20 – 2024-01-21 (×3): 75 mg via ORAL
  Filled 2024-01-20 (×3): qty 1

## 2024-01-20 MED ORDER — BUSPIRONE HCL 5 MG PO TABS
10.0000 mg | ORAL_TABLET | Freq: Three times a day (TID) | ORAL | Status: DC
  Administered 2024-01-20 – 2024-01-21 (×3): 10 mg via ORAL
  Filled 2024-01-20 (×4): qty 2

## 2024-01-20 MED ORDER — ENOXAPARIN SODIUM 40 MG/0.4ML IJ SOSY
40.0000 mg | PREFILLED_SYRINGE | INTRAMUSCULAR | Status: DC
  Administered 2024-01-20: 40 mg via SUBCUTANEOUS
  Filled 2024-01-20: qty 0.4

## 2024-01-20 MED ORDER — DIVALPROEX SODIUM ER 500 MG PO TB24
500.0000 mg | ORAL_TABLET | Freq: Two times a day (BID) | ORAL | Status: DC
  Administered 2024-01-20 – 2024-02-01 (×23): 500 mg via ORAL
  Filled 2024-01-20 (×24): qty 1

## 2024-01-20 MED ORDER — ALBUTEROL SULFATE (2.5 MG/3ML) 0.083% IN NEBU
2.5000 mg | INHALATION_SOLUTION | RESPIRATORY_TRACT | Status: DC | PRN
  Administered 2024-01-20 – 2024-01-23 (×2): 2.5 mg via RESPIRATORY_TRACT
  Filled 2024-01-20 (×2): qty 3

## 2024-01-20 MED ORDER — ACETAMINOPHEN 325 MG PO TABS
650.0000 mg | ORAL_TABLET | Freq: Four times a day (QID) | ORAL | Status: DC | PRN
  Administered 2024-01-20: 650 mg via ORAL
  Filled 2024-01-20: qty 2

## 2024-01-20 MED ORDER — SODIUM CHLORIDE 0.9 % IV SOLN
500.0000 mg | INTRAVENOUS | Status: AC
  Administered 2024-01-20 – 2024-01-23 (×4): 500 mg via INTRAVENOUS
  Filled 2024-01-20 (×4): qty 5

## 2024-01-20 MED ORDER — AMLODIPINE BESYLATE 5 MG PO TABS
10.0000 mg | ORAL_TABLET | Freq: Every day | ORAL | Status: DC
  Administered 2024-01-20 – 2024-01-21 (×2): 10 mg via ORAL
  Filled 2024-01-20 (×2): qty 2

## 2024-01-20 MED ORDER — ATORVASTATIN CALCIUM 40 MG PO TABS
40.0000 mg | ORAL_TABLET | Freq: Every day | ORAL | Status: DC
  Administered 2024-01-20 – 2024-02-01 (×11): 40 mg via ORAL
  Filled 2024-01-20 (×12): qty 1

## 2024-01-20 MED ORDER — SACUBITRIL-VALSARTAN 97-103 MG PO TABS
1.0000 | ORAL_TABLET | Freq: Two times a day (BID) | ORAL | Status: DC
  Administered 2024-01-20 – 2024-01-21 (×3): 1 via ORAL
  Filled 2024-01-20 (×3): qty 1

## 2024-01-20 MED ORDER — ENOXAPARIN SODIUM 80 MG/0.8ML IJ SOSY
65.0000 mg | PREFILLED_SYRINGE | INTRAMUSCULAR | Status: DC
  Administered 2024-01-21 – 2024-01-23 (×3): 65 mg via SUBCUTANEOUS
  Filled 2024-01-20 (×3): qty 0.8

## 2024-01-20 MED ORDER — SODIUM CHLORIDE 0.9 % IV SOLN
2.0000 g | INTRAVENOUS | Status: DC
  Administered 2024-01-20 – 2024-01-23 (×4): 2 g via INTRAVENOUS
  Filled 2024-01-20 (×4): qty 20

## 2024-01-20 MED ORDER — MELATONIN 3 MG PO TABS
3.0000 mg | ORAL_TABLET | Freq: Every day | ORAL | Status: DC
  Administered 2024-01-20 – 2024-01-31 (×11): 3 mg via ORAL
  Filled 2024-01-20 (×12): qty 1

## 2024-01-20 NOTE — Plan of Care (Signed)
   Problem: Clinical Measurements: Goal: Respiratory complications will improve Outcome: Progressing   Problem: Activity: Goal: Risk for activity intolerance will decrease Outcome: Progressing

## 2024-01-20 NOTE — Progress Notes (Signed)
 Progress Note   Patient: Jim Dunn WUJ:[id number] DOB: 1965/12/21 DOA: 01/19/2024     1 DOS: the patient was seen and examined on 01/20/2024   Brief hospital course: Jahmeek Shirk is a 58 y.o. male with medical history significant of COPD, CHF, seizure disorder, pulm vascular failure, tobacco abuse who presents emergency department due to right-sided chest pain that woke him up this morning.  Patient reports productive cough, he is a smoker.  Denies nausea vomiting.  Chest x-ray showed cardiomegaly with central pulmonary congestion, mild patchy airspace opacities in bilateral lower lungs may represent edema or infection.  Patient is admitted to TRH service for evaluation of possible pneumonia.  Assessment and Plan: Community-acquired pneumonia: He has productive cough, Chest x-ray suggestive of pneumonia, Chest pain pleuritic. Troponin flat. Will get viral panel. Patient continues to smoke 8 to 10 cigarettes/day. Continue Rocephin, azithromycin  therapy. Follow blood, sputum cultures, RVP. Continue Mucinex , incentive spirometry.  COPD: Not in exacerbation. He is on room air. Continue DuoNebs, Mucinex . Smoking cessation advised.  Hyponatremia: Sodium improved to 129. Encourage oral diet, supplements. Trend sodium.  Chronic systolic CHF: Last EF from 2023 30 to 35%. Will get repeat echocardiogram, BNP. Patient will be continued on Coreg , digoxin , Entresto .  Hypertension: Continue Norvasc , coreg , entresto .  Hyperlipidemia- On statin.  Anxiety- Takes Buspar  at home, resumed Xanax  as needed.  Seizure disorder Continue home meds  Obesity class III: BMI 40.66 Advised diet, exercise and weight reduction.  Tobacco abuse- Encouraged him to quit smoking. Nicotine  patch offered.      Out of bed to chair. Incentive spirometry. Nursing supportive care. Fall, aspiration precautions. Diet:  Diet Orders (From admission, onward)     Start     Ordered   01/20/24 0354  Diet  Heart Room service appropriate? Yes; Fluid consistency: Thin  Diet effective now       Question Answer Comment  Room service appropriate? Yes   Fluid consistency: Thin      01/20/24 0354           DVT prophylaxis: SCDs Start: 01/20/24 0354  Level of care: Telemetry   Code Status: Full Code  Subjective: Patient is seen and examined today morning. He is in prone position, coughing. Has productive cough  Physical Exam: Vitals:   01/20/24 0032 01/20/24 0434 01/20/24 0840 01/20/24 1319  BP: 137/78 127/75 137/78 135/64  Pulse: 69 68 73 73  Resp: 20 20  19   Temp: 99 F (37.2 C) 97.7 F (36.5 C)  98 F (36.7 C)  TempSrc: Oral   Oral  SpO2: 95% 96%  100%  Weight: 121.3 kg     Height:        General - Elderly African American male, distress due to coaugh HEENT - PERRLA, EOMI, atraumatic head, non tender sinuses. Lung - distant breath sounds, diffuse rhonchi, wheezes. Heart - S1, S2 heard, no murmurs, rubs, trace pedal edema. Abdomen - Soft, non tender, obese, bowel sounds good Neuro - Alert, awake and oriented x 3, non focal exam. Skin - Warm and dry.  Data Reviewed:      Latest Ref Rng & Units 01/20/2024    5:01 AM 01/19/2024   10:35 PM 01/25/2023   10:25 AM  CBC  WBC 4.0 - 10.5 K/uL 8.1  8.5  8.2   Hemoglobin 13.0 - 17.0 g/dL 95.6  21.3  08.6   Hematocrit 39.0 - 52.0 % 34.1  33.2  40.7   Platelets 150 - 400 K/uL 211  213  202       Latest Ref Rng & Units 01/20/2024    5:01 AM 01/19/2024   10:35 PM 01/25/2023   10:25 AM  BMP  Glucose 70 - 99 mg/dL 90  161  096   BUN 6 - 20 mg/dL 8  8  5    Creatinine 0.61 - 1.24 mg/dL 0.45  4.09  8.11   Sodium 135 - 145 mmol/L 129  123  127   Potassium 3.5 - 5.1 mmol/L 3.5  4.8  3.3   Chloride 98 - 111 mmol/L 94  86  93   CO2 22 - 32 mmol/L 27  27  23    Calcium  8.9 - 10.3 mg/dL 8.5  8.5  9.0    DG Chest 2 View Result Date: 01/19/2024 CLINICAL DATA:  Chest pain EXAM: CHEST - 2 VIEW COMPARISON:  Chest x-ray 07/27/2023 FINDINGS:  The heart is enlarged. There is central pulmonary vascular congestion. There are mild patchy airspace opacities in the bilateral lower lungs. There is no pleural effusion or pneumothorax. No acute fractures are seen. IMPRESSION: 1. Cardiomegaly with central pulmonary vascular congestion. 2. Mild patchy airspace opacities in the bilateral lower lungs may represent edema or infection. Electronically Signed   By: Tyron Gallon M.D.   On: 01/19/2024 23:03    Family Communication: Discussed with patient, he understand and agree. All questions answered.  Disposition: Status is: Inpatient Remains inpatient appropriate because: IV antibiotics, echo  Planned Discharge Destination: Home     Time spent: 38 minutes  Author: Aisha Hove, MD 01/20/2024 1:44 PM Secure chat 7am to 7pm For on call review [email or web address].

## 2024-01-20 NOTE — Plan of Care (Signed)
  Problem: Health Behavior/Discharge Planning: Goal: Ability to manage health-related needs will improve Outcome: Progressing   Problem: Clinical Measurements: Goal: Ability to maintain clinical measurements within normal limits will improve Outcome: Progressing Goal: Will remain free from infection Outcome: Progressing Goal: Diagnostic test results will improve Outcome: Progressing Goal: Respiratory complications will improve Outcome: Progressing Goal: Cardiovascular complication will be avoided Outcome: Progressing   Problem: Activity: Goal: Risk for activity intolerance will decrease Outcome: Progressing   Problem: Nutrition: Goal: Adequate nutrition will be maintained Outcome: Progressing   Problem: Coping: Goal: Level of anxiety will decrease Outcome: Progressing   Problem: Elimination: Goal: Will not experience complications related to bowel motility Outcome: Progressing Goal: Will not experience complications related to urinary retention Outcome: Progressing   Problem: Pain Managment: Goal: General experience of comfort will improve and/or be controlled Outcome: Progressing   Problem: Safety: Goal: Ability to remain free from injury will improve Outcome: Progressing   Problem: Skin Integrity: Goal: Risk for impaired skin integrity will decrease Outcome: Progressing   Problem: Activity: Goal: Ability to tolerate increased activity will improve Outcome: Progressing   Problem: Clinical Measurements: Goal: Ability to maintain a body temperature in the normal range will improve Outcome: Progressing   Problem: Respiratory: Goal: Ability to maintain adequate ventilation will improve Outcome: Progressing Goal: Ability to maintain a clear airway will improve Outcome: Progressing   Problem: Education: Goal: Knowledge of General Education information will improve Description: Including pain rating scale, medication(s)/side effects and non-pharmacologic  comfort measures Outcome: Not Progressing

## 2024-01-20 NOTE — Progress Notes (Signed)
   01/20/24 1338  TOC Assessment  TOC screening is complete Yes  Once discharged, how will the patient get to their discharge location? Family/Friend - Photographer  Expected Discharge Plan Group Home  Final next level of care Group Home  Barriers to Discharge Continued Medical Work up  Patient states their goals for this hospitalization and ongoing recovery are: Discharge group home in [place]  [place] Medicare.gov Compare Post Acute Care list provided to: Patient  Share Information with NAME Jim Dunn  Permission granted to share info w Relationship Legal Guardin  Permission granted to share info w Contact Information [phone]  Patient language and need for interpreter reviewed: No  Appearance: Appears stated age  Attitude/Demeanor/Rapport Engaged  Affect (typically observed) Accepting  Orientation:  Oriented to Self;Oriented to Place;Oriented to  Time;Oriented to Situation   Admitted with Community Acquired pneumonia. Transition of Care Department [facility]) has reviewed patient.  TOC  will continue to monitor patient advancement through interdisciplinary progressions rounds.

## 2024-01-20 NOTE — Progress Notes (Signed)
 Patient brought up to the floor via stretcher. Patient able to ambulate from stretcher to bed without any difficultly. Alert and oriented * 4. Room and unit orientation given. Call bell within reach. Bed in low position.

## 2024-01-20 NOTE — TOC Initial Note (Signed)
 Transition of Care [facility]) - Initial/Assessment Note    Patient Details  Name: Jim Dunn MRN: [id number] Date of Birth: 1966-08-02  Transition of Care [facility]) CM/SW Contact:    Lynda Sands, RN Phone Number: 01/20/2024, 1:45 PM  Clinical Narrative:   CM me with patient at bedside. Patient reports he live in a Group Home [facility]. Patient is independent with adl's. Patient ambulates without assistance.  Patient stated someone from the group home will pick him transport him from hospital.               Expected Discharge Plan: Group Home Barriers to Discharge: Continued Medical Work up   Patient Goals and CMS Choice Patient states their goals for this hospitalization and ongoing recovery are:: Discharge group home in [place] CMS Medicare.gov Compare Post Acute Care list provided to:: Patient     Expected Discharge Plan and Services   Discharge to the group home [facility]               Prior Living Arrangements/Services    Group Home   Patient language and need for interpreter reviewed:: No    Activities of Daily Living  Independent with adl's.   ADL Screening (condition at time of admission) Independently performs ADLs?: Yes (appropriate for developmental age) Is the patient deaf or have difficulty hearing?: No Does the patient have difficulty seeing, even when wearing glasses/contacts?: No Does the patient have difficulty concentrating, remembering, or making decisions?: No  Permission Sought/Granted      Share Information with NAME: Jim Dunn     Permission granted to share info w Relationship: Legal Guardin  Permission granted to share info w Contact Information: [phone]  Emotional Assessment Appearance:: Appears stated age Attitude/Demeanor/Rapport: Engaged Affect (typically observed): Accepting Orientation: : Oriented to Self, Oriented to Place, Oriented to  Time, Oriented to Situation      Admission diagnosis:  Hyponatremia  [E87.1] CAP (community acquired pneumonia) [J18.9] Pneumonia of both lungs due to infectious organism, unspecified part of lung [J18.9] Patient Active Problem List   Diagnosis Date Noted   Obesity, Class III, BMI 40-49.9 (morbid obesity) 01/20/2024   CAP (community acquired pneumonia) 01/19/2024   Acute on chronic systolic CHF (congestive heart failure) (HCC) 05/03/2022   Essential hypertension 05/03/2022   Anxiety 05/03/2022   Dyslipidemia 05/03/2022   Sepsis due to undetermined organism (HCC) 05/03/2022   Obesity (BMI 30-39.9) 02/07/2022   Hyponatremia    Schizophrenia (HCC)    Peripheral neuropathy 10/20/2021   Dementia without behavioral disturbance (HCC) 09/25/2021   Needs assistance with community resources 09/12/2021   Disorganized schizophrenia (HCC)    Dementia (HCC) 06/08/2020   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   Chronic post-traumatic stress disorder (PTSD)    Noncompliance with medications    Subtherapeutic serum dilantin  level    Seizure (HCC)    Hypomagnesemia 02/23/2020   Suicidal ideation 02/23/2020   Paranoid schizophrenia with hallucinations    Hypokalemia    Chronic systolic CHF (congestive heart failure) (HCC) 01/22/2020   Mixed hyperlipidemia 01/22/2020   COPD (chronic obstructive pulmonary disease) (HCC) 01/22/2020   Microcytic anemia 01/22/2020   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 [place] [address] Phone: [phone] Fax: [phone]     Social Drivers of Health (SDOH) Social History: SDOH Screenings  Food Insecurity: No Food Insecurity (01/20/2024)  Housing: Low Risk  (01/20/2024)  Transportation Needs: No Transportation Needs (01/20/2024)  Utilities: Not At Risk (01/20/2024)  Social Connections: Socially Isolated (01/20/2024)   Tobacco Use: High Risk (01/20/2024)   SDOH Interventions:  No needs .  Readmission Risk Interventions    01/18/2022    2:30 PM  Readmission Risk Prevention Plan  Transportation Screening Complete  Medication Review (RN Care Manager) Complete  PCP or Specialist appointment within 3-5 days of discharge Complete  HRI or Home Care Consult Complete  SW Recovery Care/Counseling Consult Complete  Palliative Care Screening Not Applicable  Skilled Nursing Facility Not Applicable

## 2024-01-20 NOTE — Plan of Care (Signed)

## 2024-01-21 ENCOUNTER — Inpatient Hospital Stay (HOSPITAL_COMMUNITY)

## 2024-01-21 ENCOUNTER — Other Ambulatory Visit (HOSPITAL_COMMUNITY): Payer: Self-pay | Admitting: *Deleted

## 2024-01-21 DIAGNOSIS — B9562 Methicillin resistant Staphylococcus aureus infection as the cause of diseases classified elsewhere: Secondary | ICD-10-CM

## 2024-01-21 DIAGNOSIS — R7881 Bacteremia: Secondary | ICD-10-CM

## 2024-01-21 DIAGNOSIS — J431 Panlobular emphysema: Secondary | ICD-10-CM | POA: Diagnosis not present

## 2024-01-21 DIAGNOSIS — I5022 Chronic systolic (congestive) heart failure: Secondary | ICD-10-CM | POA: Diagnosis not present

## 2024-01-21 DIAGNOSIS — J189 Pneumonia, unspecified organism: Secondary | ICD-10-CM | POA: Diagnosis not present

## 2024-01-21 DIAGNOSIS — E871 Hypo-osmolality and hyponatremia: Secondary | ICD-10-CM | POA: Diagnosis not present

## 2024-01-21 LAB — ECHOCARDIOGRAM COMPLETE
AR max vel: 1.78 cm2
AV Area VTI: 1.87 cm2
AV Area mean vel: 1.96 cm2
AV Mean grad: 4.5 mmHg
AV Peak grad: 10.5 mmHg
Ao pk vel: 1.62 m/s
Area-P 1/2: 2.42 cm2
Height: 68 in
S' Lateral: 4.2 cm
Weight: 4278.69 [oz_av]

## 2024-01-21 LAB — BLOOD CULTURE ID PANEL (REFLEXED) - BCID2

## 2024-01-21 LAB — BASIC METABOLIC PANEL WITH GFR
Anion gap: 9 (ref 5–15)
BUN: 7 mg/dL (ref 6–20)
CO2: 27 mmol/L (ref 22–32)
Calcium: 8.3 mg/dL — ABNORMAL LOW (ref 8.9–10.3)
Chloride: 87 mmol/L — ABNORMAL LOW (ref 98–111)
Creatinine, Ser: 0.8 mg/dL (ref 0.61–1.24)
GFR, Estimated: >60 mL/min (ref >60)
Glucose, Bld: 99 mg/dL (ref 70–99)
Potassium: 3.1 mmol/L — ABNORMAL LOW (ref 3.5–5.1)
Sodium: 123 mmol/L — ABNORMAL LOW (ref 135–145)

## 2024-01-21 MED ORDER — PERFLUTREN LIPID MICROSPHERE
1.0000 mL | INTRAVENOUS | Status: AC | PRN
  Administered 2024-01-21: 5 mL via INTRAVENOUS

## 2024-01-21 MED ORDER — CARVEDILOL 3.125 MG PO TABS
6.2500 mg | ORAL_TABLET | Freq: Two times a day (BID) | ORAL | Status: DC
  Administered 2024-01-21 – 2024-02-01 (×21): 6.25 mg via ORAL
  Filled 2024-01-21 (×22): qty 2

## 2024-01-21 MED ORDER — VANCOMYCIN HCL 1250 MG/250ML IV SOLN
1250.0000 mg | Freq: Two times a day (BID) | INTRAVENOUS | Status: DC
  Administered 2024-01-22 – 2024-01-23 (×4): 1250 mg via INTRAVENOUS
  Filled 2024-01-21 (×4): qty 250

## 2024-01-21 MED ORDER — FLUOXETINE HCL 20 MG PO CAPS
20.0000 mg | ORAL_CAPSULE | Freq: Every day | ORAL | Status: DC
  Administered 2024-01-21 – 2024-02-01 (×10): 20 mg via ORAL
  Filled 2024-01-21 (×11): qty 1

## 2024-01-21 MED ORDER — VANCOMYCIN HCL 2000 MG/400ML IV SOLN
2000.0000 mg | Freq: Once | INTRAVENOUS | Status: AC
  Administered 2024-01-21: 2000 mg via INTRAVENOUS
  Filled 2024-01-21: qty 400

## 2024-01-21 MED ORDER — MIRTAZAPINE 15 MG PO TABS
15.0000 mg | ORAL_TABLET | Freq: Every day | ORAL | Status: DC
  Administered 2024-01-21 – 2024-01-31 (×10): 15 mg via ORAL
  Filled 2024-01-21 (×11): qty 1

## 2024-01-21 MED ORDER — SENNOSIDES-DOCUSATE SODIUM 8.6-50 MG PO TABS
2.0000 | ORAL_TABLET | Freq: Two times a day (BID) | ORAL | Status: DC
  Administered 2024-01-21 – 2024-02-01 (×20): 2 via ORAL
  Filled 2024-01-21 (×22): qty 2

## 2024-01-21 MED ORDER — BUSPIRONE HCL 5 MG PO TABS
5.0000 mg | ORAL_TABLET | Freq: Three times a day (TID) | ORAL | Status: DC
  Administered 2024-01-21 – 2024-02-01 (×31): 5 mg via ORAL
  Filled 2024-01-21 (×33): qty 1

## 2024-01-21 MED ORDER — POTASSIUM CHLORIDE 20 MEQ PO PACK
40.0000 meq | PACK | Freq: Two times a day (BID) | ORAL | Status: DC
  Administered 2024-01-21 (×2): 40 meq via ORAL
  Filled 2024-01-21 (×2): qty 2

## 2024-01-21 MED ORDER — SODIUM CHLORIDE 0.9 % IV SOLN: INTRAVENOUS | Status: AC

## 2024-01-21 MED ORDER — POLYETHYLENE GLYCOL 3350 17 GM/SCOOP PO POWD
17.0000 g | Freq: Two times a day (BID) | ORAL | Status: DC
  Administered 2024-01-21 – 2024-01-22 (×2): 17 g via ORAL

## 2024-01-21 NOTE — Progress Notes (Signed)
 Progress Note   Patient: Jim Dunn PIR:[id number] DOB: 1966/03/02 DOA: 01/19/2024     2 DOS: the patient was seen and examined on 01/21/2024   Brief hospital course: Jim Dunn is a 58 y.o. male with medical history significant of COPD, CHF, seizure disorder, pulm vascular failure, tobacco abuse who presents emergency department due to right-sided chest pain that woke him up this morning.  Patient reports productive cough, he is a smoker.  Denies nausea vomiting.  Chest x-ray showed cardiomegaly with central pulmonary congestion, mild patchy airspace opacities in bilateral lower lungs may represent edema or infection.  Patient is admitted to TRH service for evaluation of possible pneumonia.  Assessment and Plan: Community-acquired pneumonia: Staph bacteremia He has productive cough, Chest x-ray suggestive of pneumonia, Chest pain pleuritic. Troponin flat. Respiratory viral panel negative. Blood cultures positive BCID MRSA, started vancomycin  per pharmacy protocol. Continue azithromycin . Continue Mucinex , incentive spirometry.  COPD: Not in exacerbation. He is on room air. Continue DuoNebs, Mucinex . Smoking cessation advised.  Hyponatremia: Sodium 123 today. Started gentle IV fluids. Encourage oral diet, supplements. Trend sodium.  Chronic systolic CHF: Last EF from 2023 30 to 35%. BNP low, echo pending. Continue Coreg , digoxin , Entresto .  Hypertension: Continue Norvasc , coreg , entresto .  Hyperlipidemia- On statin.  Anxiety- Continue Buspar , Xanax  as needed.  Seizure disorder Continue home meds  Obesity class III: BMI 40.66 Advised diet, exercise and weight reduction.  Tobacco abuse- Encouraged him to quit smoking. Nicotine  patch offered.      Out of bed to chair. Incentive spirometry. Nursing supportive care. Fall, aspiration precautions. Diet:  Diet Orders (From admission, onward)     Start     Ordered   01/20/24 0354  Diet Heart Room service  appropriate? Yes; Fluid consistency: Thin  Diet effective now       Question Answer Comment  Room service appropriate? Yes   Fluid consistency: Thin      01/20/24 0354           DVT prophylaxis: SCDs Start: 01/20/24 0354  Level of care: Telemetry   Code Status: Full Code  Subjective: Patient is seen and examined today morning. He is more sleepy today. Did not eat. Upon awakening denies any complaints.  Physical Exam: Vitals:   01/20/24 1319 01/20/24 1710 01/20/24 2029 01/21/24 0530  BP: 135/64  (!) 142/88 99/75  Pulse: 73  75 65  Resp: 19  18 18   Temp: 98 F (36.7 C)  98.3 F (36.8 C) 97.6 F (36.4 C)  TempSrc: Oral  Oral   SpO2: 100% 99% 95% 93%  Weight:      Height:        General - Elderly African American male, no apparent distress. HEENT - PERRLA, EOMI, atraumatic head, non tender sinuses. Lung - distant breath sounds, diffuse rhonchi, wheezes. Heart - S1, S2 heard, no murmurs, rubs, trace pedal edema. Abdomen - Soft, non tender, obese, bowel sounds good Neuro - sleeping, arousable, non focal exam. Skin - Warm and dry.  Data Reviewed:      Latest Ref Rng & Units 01/20/2024    5:01 AM 01/19/2024   10:35 PM 01/25/2023   10:25 AM  CBC  WBC 4.0 - 10.5 K/uL 8.1  8.5  8.2   Hemoglobin 13.0 - 17.0 g/dL 63.0  16.0  10.9   Hematocrit 39.0 - 52.0 % 34.1  33.2  40.7   Platelets 150 - 400 K/uL 211  213  202       Latest Ref  Rng & Units 01/21/2024    4:08 AM 01/20/2024    5:01 AM 01/19/2024   10:35 PM  BMP  Glucose 70 - 99 mg/dL 99  90  657   BUN 6 - 20 mg/dL 7  8  8    Creatinine 0.61 - 1.24 mg/dL 8.46  9.62  9.52   Sodium 135 - 145 mmol/L 123  129  123   Potassium 3.5 - 5.1 mmol/L 3.1  3.5  4.8   Chloride 98 - 111 mmol/L 87  94  86   CO2 22 - 32 mmol/L 27  27  27    Calcium  8.9 - 10.3 mg/dL 8.3  8.5  8.5    DG Chest 2 View Result Date: 01/19/2024 CLINICAL DATA:  Chest pain EXAM: CHEST - 2 VIEW COMPARISON:  Chest x-ray 07/27/2023 FINDINGS: The heart is  enlarged. There is central pulmonary vascular congestion. There are mild patchy airspace opacities in the bilateral lower lungs. There is no pleural effusion or pneumothorax. No acute fractures are seen. IMPRESSION: 1. Cardiomegaly with central pulmonary vascular congestion. 2. Mild patchy airspace opacities in the bilateral lower lungs may represent edema or infection. Electronically Signed   By: Tyron Gallon M.D.   On: 01/19/2024 23:03    Disposition: Status is: Inpatient Remains inpatient appropriate because: MRSA bacteremia, IV antibiotics, echo, sodium monitoring   Planned Discharge Destination: Home     Time spent: 39 minutes  Author: Aisha Hove, MD 01/21/2024 1:10 PM Secure chat 7am to 7pm For on call review [email or web address].

## 2024-01-21 NOTE — Progress Notes (Signed)
*  PRELIMINARY RESULTS* Echocardiogram 2D Echocardiogram has been performed with Definity .  Jim Dunn 01/21/2024, 5:59 PM

## 2024-01-21 NOTE — Progress Notes (Signed)
 [facility]    Date of Admission:  01/19/2024   Total days of antibiotics 3 of ctx and azithromycin    ID: Jim Dunn is a 58 y.o. male with COPD, CHF, hx of seizure disorder who was admitted on 5/17 with acute onset of chest pain with coughing, denies fever, chills, nausea. On admit, he was found to have no leukocytosis but toxic granulation on smear, elevated procalcitonin favoring infectious process cxr showing bilateral patchy airspace disease ? Edema vs. Infiltrate. He was started on CAP coverage, but his blood cx in 1 of 4 bottles showing MRSA. He was started on vancomycin  on 5/19 Principal Problem:   CAP (community acquired pneumonia) Active Problems:   Chronic systolic CHF (congestive heart failure) (HCC)   Mixed hyperlipidemia   COPD (chronic obstructive pulmonary disease) (HCC)   Microcytic anemia   Seizure disorder (HCC)   Hyponatremia   Essential hypertension   Obesity, Class III, BMI 40-49.9 (morbid obesity)    Subjective: afebrile  Medications:   atorvastatin   40 mg Oral Daily   busPIRone   5 mg Oral TID   carvedilol   6.25 mg Oral BID WC   dextromethorphan -guaiFENesin   1 tablet Oral BID   digoxin   0.25 mg Oral Daily   divalproex   500 mg Oral BID   enoxaparin  (LOVENOX ) injection  65 mg Subcutaneous Q24H   FLUoxetine   20 mg Oral Daily   lidocaine   1 patch Transdermal Q24H   melatonin  3 mg Oral QHS   mirtazapine   15 mg Oral QHS   polyethylene glycol powder  17 g Oral BID   potassium chloride   40 mEq Oral BID   senna-docusate  2 tablet Oral BID    Objective: Vital signs in last 24 hours: Temp:  [97.6 F (36.4 C)-98.3 F (36.8 C)] 97.6 F (36.4 C) (05/19 0530) Pulse Rate:  [65-75] 65 (05/19 0530) Resp:  [18] 18 (05/19 0530) BP: (99-142)/(75-88) 99/75 (05/19 0530) SpO2:  [93 %-99 %] 93 % (05/19 0530)  Did not examine patient  Lab Results Recent Labs    01/19/24 2235 01/20/24 0501 01/21/24 0408  WBC 8.5 8.1  --   HGB  11.1* 11.4*  --   HCT 33.2* 34.1*  --   NA 123* 129* 123*  K 4.8 3.5 3.1*  CL 86* 94* 87*  CO2 27 27 27   BUN 8 8 7   CREATININE 0.85 0.60* 0.80   Liver Panel Recent Labs    01/19/24 2235 01/20/24 0501  PROT 7.3 7.1  ALBUMIN 3.5 3.3*  AST 24 11*  ALT 8 8  ALKPHOS 67 61  BILITOT 1.2 0.4   Microbiology: 5/17 blood cx 1/4 bottles MRSA Studies/Results: DG Chest 2 View Result Date: 01/19/2024 CLINICAL DATA:  Chest pain EXAM: CHEST - 2 VIEW COMPARISON:  Chest x-ray 07/27/2023 FINDINGS: The heart is enlarged. There is central pulmonary vascular congestion. There are mild patchy airspace opacities in the bilateral lower lungs. There is no pleural effusion or pneumothorax. No acute fractures are seen. IMPRESSION: 1. Cardiomegaly with central pulmonary vascular congestion. 2. Mild patchy airspace opacities in the bilateral lower lungs may represent edema or infection. Electronically Signed   By: Tyron Gallon M.D.   On: 01/19/2024 23:03     Assessment/Plan: 58yo M with CAP presenting with cough, pleuritic chest pain and found to have secondary MRSA bacteremia. -continue on vancomycin  - repeat blood cx on 5/21 to see that his bacteremia is clearing - recommend to get TTE  for now to see if any signs endocarditis  Hyponatremia = possible due to low oral intake. On small volume repletion of IVF, recommend to repeat BMP to see that sodium is not over-corrected.   Pneumonia = recommend to finish 5 days of ceftriaxone  plus azithromycin . Currently day 3. Will continue on vancomycin  for bacteremia  [facility] Pager: [phone]  01/21/2024, 4:10 PM

## 2024-01-21 NOTE — Care Management Important Message (Signed)
 Important Message  Patient Details  Name: Jim Dunn MRN: [id number] Date of Birth: 1966-01-04   Important Message Given:  N/A - LOS <3 / Initial given by admissions     Neila Bally 01/21/2024, 11:12 AM

## 2024-01-21 NOTE — Progress Notes (Signed)
 Pharmacy Antibiotic Note  Jim Dunn is a 58 y.o. male admitted on 01/19/2024 with MRSA bacteremia.  Pharmacy has been consulted for vancomycin  dosing.  Continue ceftriaxone  and azithromycin  per MD for CAP.   Plan: -Vancomycin  1250 mg q12h (eAUC 467, SCr 0.8, Vd 0.5)  - Will continue to monitor renal function, vancomycin  levels, and LOT.  Height: 5\' 8"  (172.7 cm) Weight: 121.3 kg (267 lb 6.7 oz) IBW/kg (Calculated) : 68.4  Temp (24hrs), Avg:98 F (36.7 C), Min:97.6 F (36.4 C), Max:98.3 F (36.8 C)  Recent Labs  Lab 01/19/24 2235 01/20/24 0501 01/21/24 0408  WBC 8.5 8.1  --   CREATININE 0.85 0.60* 0.80    Estimated Creatinine Clearance: 132.2 mL/min (by C-G formula based on SCr of 0.8 mg/dL).    No Known Allergies  Antimicrobials this admission: Vancomycin  5/19 >>  Ceftriaxone  5/17 >>  Azithromycin  5/17 >>  Microbiology results: 5/18 BCx: MRSA  Thank you for allowing pharmacy to be a part of this patient's care.  Winnie Haver, PharmD Candidate [phone] [facility] [employer] [facility] [employer] 01/21/2024 1:19 PM

## 2024-01-21 NOTE — Plan of Care (Signed)

## 2024-01-21 NOTE — Evaluation (Signed)
 Physical Therapy Evaluation Patient Details Name: Jim Dunn MRN: [id number] DOB: 02/02/1966 Today's Date: 01/21/2024  History of Present Illness  Jim Dunn is a 58 y.o. male with medical history significant of COPD, CHF, seizure disorder, pulm vascular failure, tobacco abuse who presents emergency department due to right-sided chest pain that woke him up this morning.  Pain was sharp, pleuritic, nonradiating and was associated with productive cough.  Patient continues to smoke.  He denies nausea, vomiting, shortness of breath.   Clinical Impression  Patient functioning near baseline for functional mobility and gait demonstrating good return for ambulating in room, hallway without loss of balance or need for an AD          If plan is discharge home, recommend the following: Help with stairs or ramp for entrance   Can travel by private vehicle        Equipment Recommendations None recommended by PT  Recommendations for Other Services       Functional Status Assessment Patient has not had a recent decline in their functional status     Precautions / Restrictions Precautions Precautions: None Restrictions Weight Bearing Restrictions Per Provider Order: No      Mobility  Bed Mobility Overal bed mobility: Modified Independent                  Transfers Overall transfer level: Modified independent                      Ambulation/Gait Ambulation/Gait assistance: Modified independent (Device/Increase time) Gait Distance (Feet): 100 Feet   Gait Pattern/deviations: WFL(Within Functional Limits) Gait velocity: decreased     General Gait Details: grossly WFL with good return for ambulating in room, hallway without loss of balance or need for an AD  Stairs            Wheelchair Mobility     Tilt Bed    Modified Rankin (Stroke Patients Only)       Balance Overall balance assessment: No apparent balance deficits (not formally assessed)                                            Pertinent Vitals/Pain Pain Assessment Pain Assessment: No/denies pain    Home Living Family/patient expects to be discharged to:: Group home     Type of Home: Group Home           Home Equipment: None      Prior Function Prior Level of Function : Independent/Modified Independent             Mobility Comments: household and short distanced community without AD ADLs Comments: facility provides cooking/cleaning, laundry     Extremity/Trunk Assessment   Upper Extremity Assessment Upper Extremity Assessment: Overall WFL for tasks assessed    Lower Extremity Assessment Lower Extremity Assessment: Overall WFL for tasks assessed    Cervical / Trunk Assessment Cervical / Trunk Assessment: Normal  Communication   Communication Communication: No apparent difficulties    Cognition Arousal: Alert Behavior During Therapy: WFL for tasks assessed/performed   PT - Cognitive impairments: No apparent impairments                         Following commands: Intact       Cueing Cueing Techniques: Verbal cues     General Comments  Exercises     Assessment/Plan    PT Assessment Patient does not need any further PT services  PT Problem List         PT Treatment Interventions      PT Goals (Current goals can be found in the Care Plan section)  Acute Rehab PT Goals Patient Stated Goal: return home with group home staff to assist PT Goal Formulation: With patient Time For Goal Achievement: 01/21/24 Potential to Achieve Goals: Good    Frequency       Co-evaluation               AM-PAC PT "6 Clicks" Mobility  Outcome Measure Help needed turning from your back to your side while in a flat bed without using bedrails?: None Help needed moving from lying on your back to sitting on the side of a flat bed without using bedrails?: None Help needed moving to and from a bed to a chair  (including a wheelchair)?: None Help needed standing up from a chair using your arms (e.g., wheelchair or bedside chair)?: None Help needed to walk in hospital room?: None Help needed climbing 3-5 steps with a railing? : A Little 6 Click Score: 23    End of Session   Activity Tolerance: Patient tolerated treatment well Patient left: in bed;with call bell/phone within reach Nurse Communication: Mobility status PT Visit Diagnosis: Unsteadiness on feet (R26.81);Other abnormalities of gait and mobility (R26.89);Muscle weakness (generalized) (M62.81)    Time: 1308-6578 PT Time Calculation (min) (ACUTE ONLY): 10 min   Charges:   PT Evaluation $PT Eval Low Complexity: 1 Low PT Treatments $Therapeutic Activity: 8-22 mins PT General Charges $$ ACUTE PT VISIT: 1 Visit         12:33 PM, 01/21/24 Walton Guppy, MPT Physical Therapist with [facility] [phone] office [phone] mobile phone

## 2024-01-22 DIAGNOSIS — J189 Pneumonia, unspecified organism: Secondary | ICD-10-CM | POA: Diagnosis not present

## 2024-01-22 DIAGNOSIS — I5022 Chronic systolic (congestive) heart failure: Secondary | ICD-10-CM | POA: Diagnosis not present

## 2024-01-22 DIAGNOSIS — J431 Panlobular emphysema: Secondary | ICD-10-CM | POA: Diagnosis not present

## 2024-01-22 DIAGNOSIS — E871 Hypo-osmolality and hyponatremia: Secondary | ICD-10-CM | POA: Diagnosis not present

## 2024-01-22 LAB — BASIC METABOLIC PANEL WITH GFR
Anion gap: 8 (ref 5–15)
BUN: 6 mg/dL (ref 6–20)
CO2: 27 mmol/L (ref 22–32)
Calcium: 8.5 mg/dL — ABNORMAL LOW (ref 8.9–10.3)
Chloride: 95 mmol/L — ABNORMAL LOW (ref 98–111)
Creatinine, Ser: 0.55 mg/dL — ABNORMAL LOW (ref 0.61–1.24)
GFR, Estimated: >60 mL/min (ref >60)
Glucose, Bld: 82 mg/dL (ref 70–99)
Potassium: 4.2 mmol/L (ref 3.5–5.1)
Sodium: 130 mmol/L — ABNORMAL LOW (ref 135–145)

## 2024-01-22 LAB — LEGIONELLA PNEUMOPHILA SEROGP 1 UR AG: L. pneumophila Serogp 1 Ur Ag: NEGATIVE

## 2024-01-22 NOTE — Plan of Care (Signed)

## 2024-01-22 NOTE — Progress Notes (Signed)
 Progress Note   Patient: Jim Dunn ZOX:[id number] DOB: Apr 02, 1966 DOA: 01/19/2024     3 DOS: the patient was seen and examined on 01/22/2024   Brief hospital course: Jim Dunn is a 58 y.o. male with medical history significant of COPD, CHF, seizure disorder, pulm vascular failure, tobacco abuse who presents emergency department due to right-sided chest pain that woke him up this morning.  Patient reports productive cough, he is a smoker.  Denies nausea vomiting.  Chest x-ray showed cardiomegaly with central pulmonary congestion, mild patchy airspace opacities in bilateral lower lungs may represent edema or infection.  Patient is admitted to TRH service for evaluation of possible pneumonia.  Assessment and Plan: MRSA bacteremia Blood cultures positive BCID MRSA, started vancomycin  per pharmacy protocol. ID on board recommended repeat blood cultures 5/21 orders placed.  Echo 01/21/24 no valvular vegetations. EF 60%.  Community-acquired pneumonia: He has productive cough, Chest x-ray suggestive of pneumonia, Chest pain pleuritic. Troponin flat. Respiratory viral panel negative. Continue Rocephin  and azithromycin . Continue Mucinex , incentive spirometry.  COPD: Not in exacerbation. He is on room air. Continue DuoNebs, Mucinex . Smoking cessation advised.  Hyponatremia: Sodium 130 today. Will hold gentle IV fluids. Encourage oral diet, supplements. Trend sodium.  Chronic systolic CHF: He is euvolemic, no exacerbation of heart failure. BNP lower side, echo shows EF 60%,  Continue Coreg , digoxin , Entresto .  Hypertension: Continue Norvasc , coreg , entresto .  Hyperlipidemia- On statin.  Anxiety- Continue Buspar , Xanax  as needed.  Seizure disorder Continue home meds  Obesity class III: BMI 40.66 Advised diet, exercise and weight reduction.  Tobacco abuse- Encouraged him to quit smoking. Nicotine  patch ordered.      Out of bed to chair. Incentive spirometry. Nursing  supportive care. Fall, aspiration precautions. Diet:  Diet Orders (From admission, onward)     Start     Ordered   01/20/24 0354  Diet Heart Room service appropriate? Yes; Fluid consistency: Thin  Diet effective now       Question Answer Comment  Room service appropriate? Yes   Fluid consistency: Thin      01/20/24 0354           DVT prophylaxis: SCDs Start: 01/20/24 0354  Level of care: Telemetry   Code Status: Full Code  Subjective: Patient is seen and examined today morning. He is more alert, awake, able to answer me. Cough better, breathing improved. Family at bedside.  Physical Exam: Vitals:   01/21/24 0530 01/21/24 2012 01/22/24 0246 01/22/24 1400  BP: 99/75 (!) 115/57 125/75 122/89  Pulse: 65 72 71 86  Resp: 18 18 18 19   Temp: 97.6 F (36.4 C) 99.2 F (37.3 C) 98.5 F (36.9 C) 98.5 F (36.9 C)  TempSrc:  Oral  Oral  SpO2: 93% 98% 94% 94%  Weight:      Height:        General - Elderly African American male, no apparent distress. HEENT - PERRLA, EOMI, atraumatic head, non tender sinuses. Lung - distant breath sounds, diffuse rhonchi, wheezes. Heart - S1, S2 heard, no murmurs, rubs, trace pedal edema. Abdomen - Soft, non tender, obese, bowel sounds good Neuro - sleeping, arousable, non focal exam. Skin - Warm and dry.  Data Reviewed:      Latest Ref Rng & Units 01/20/2024    5:01 AM 01/19/2024   10:35 PM 01/25/2023   10:25 AM  CBC  WBC 4.0 - 10.5 K/uL 8.1  8.5  8.2   Hemoglobin 13.0 - 17.0 g/dL 81.1  91.4  13.5   Hematocrit 39.0 - 52.0 % 34.1  33.2  40.7   Platelets 150 - 400 K/uL 211  213  202       Latest Ref Rng & Units 01/22/2024    4:13 AM 01/21/2024    4:08 AM 01/20/2024    5:01 AM  BMP  Glucose 70 - 99 mg/dL 82  99  90   BUN 6 - 20 mg/dL 6  7  8    Creatinine 0.61 - 1.24 mg/dL 3.08  6.57  8.46   Sodium 135 - 145 mmol/L 130  123  129   Potassium 3.5 - 5.1 mmol/L 4.2  3.1  3.5   Chloride 98 - 111 mmol/L 95  87  94   CO2 22 - 32 mmol/L 27   27  27    Calcium  8.9 - 10.3 mg/dL 8.5  8.3  8.5    ECHOCARDIOGRAM COMPLETE Result Date: 01/21/2024    ECHOCARDIOGRAM REPORT   Patient Name:   Jim Dunn Date of Exam: 01/21/2024 Medical Rec #:  [id number]   Height:       68.0 in Accession #:    [id number]  Weight:       267.4 lb Date of Birth:  [birth year]    BSA:          2.312 m Patient Age:    57 years    BP:           99/75 mmHg Patient Gender: M           HR:           65 bpm. Exam Location:  [facility] Procedure: 2D Echo, Cardiac Doppler, Color Doppler and Intracardiac            Opacification Agent (Both Spectral and Color Flow Doppler were            utilized during procedure). Indications:    Congestive Heart Failure I50.9  History:        Patient has prior history of Echocardiogram examinations, most                 recent 09/26/2021. CHF, COPD; Risk Factors:Hypertension,                 Dyslipidemia and Current Smoker.  Sonographer:    Denese Finn RCS Referring Phys: [id number] [facility]  Sonographer Comments: Technically difficult study due to poor echo windows. Image acquisition challenging due to respiratory motion and Image acquisition challenging due to patient body habitus. IMPRESSIONS  1. Left ventricular ejection fraction, by estimation, is 55 to 60%. The left ventricle has normal function. The left ventricle has no regional wall motion abnormalities. Left ventricular diastolic parameters are consistent with Grade I diastolic dysfunction (impaired relaxation).  2. Right ventricular systolic function is normal. The right ventricular size is normal. Tricuspid regurgitation signal is inadequate for assessing PA pressure.  3. The mitral valve is normal in structure. No evidence of mitral valve regurgitation. No evidence of mitral stenosis.  4. The aortic valve is tricuspid. Aortic valve regurgitation is not visualized. No aortic stenosis is present. FINDINGS  Left Ventricle: Left ventricular ejection fraction, by estimation, is 55 to 60%.  The left ventricle has normal function. The left ventricle has no regional wall motion abnormalities. Definity  contrast agent was given IV to delineate the left ventricular  endocardial borders. The left ventricular internal cavity size was normal in size. There is no left ventricular hypertrophy. Left ventricular diastolic parameters are  consistent with Grade I diastolic dysfunction (impaired relaxation). Normal left ventricular filling pressure. Right Ventricle: The right ventricular size is normal. Right vetricular wall thickness was not well visualized. Right ventricular systolic function is normal. Tricuspid regurgitation signal is inadequate for assessing PA pressure. Left Atrium: Left atrial size was normal in size. Right Atrium: Right atrial size was normal in size. Pericardium: There is no evidence of pericardial effusion. Mitral Valve: The mitral valve is normal in structure. No evidence of mitral valve regurgitation. No evidence of mitral valve stenosis. Tricuspid Valve: The tricuspid valve is normal in structure. Tricuspid valve regurgitation is not demonstrated. No evidence of tricuspid stenosis. Aortic Valve: The aortic valve is tricuspid. Aortic valve regurgitation is not visualized. No aortic stenosis is present. Aortic valve mean gradient measures 4.5 mmHg. Aortic valve peak gradient measures 10.5 mmHg. Aortic valve area, by VTI measures 1.87  cm. Pulmonic Valve: The pulmonic valve was not well visualized. Pulmonic valve regurgitation is not visualized. No evidence of pulmonic stenosis. Aorta: The aortic root is normal in size and structure. Venous: The inferior vena cava was not well visualized. IAS/Shunts: No atrial level shunt detected by color flow Doppler.  LEFT VENTRICLE PLAX 2D LVIDd:         5.80 cm   Diastology LVIDs:         4.20 cm   LV e' medial:    4.57 cm/s LV PW:         1.00 cm   LV E/e' medial:  12.8 LV IVS:        0.90 cm   LV e' lateral:   6.53 cm/s LVOT diam:     2.00 cm   LV  E/e' lateral: 9.0 LV SV:         52 LV SV Index:   23 LVOT Area:     3.14 cm  RIGHT VENTRICLE RV S prime:     11.90 cm/s TAPSE (M-mode): 2.3 cm LEFT ATRIUM             Index        RIGHT ATRIUM           Index LA diam:        4.65 cm 2.01 cm/m   RA Area:     17.50 cm LA Vol (A2C):   47.2 ml 20.41 ml/m  RA Volume:   48.90 ml  21.15 ml/m LA Vol (A4C):   79.1 ml 34.21 ml/m LA Biplane Vol: 61.1 ml 26.42 ml/m  AORTIC VALVE AV Area (Vmax):    1.78 cm AV Area (Vmean):   1.96 cm AV Area (VTI):     1.87 cm AV Vmax:           162.14 cm/s AV Vmean:          93.975 cm/s AV VTI:            0.278 m AV Peak Grad:      10.5 mmHg AV Mean Grad:      4.5 mmHg LVOT Vmax:         91.80 cm/s LVOT Vmean:        58.700 cm/s LVOT VTI:          0.166 m LVOT/AV VTI ratio: 0.60  AORTA Ao Root diam: 3.20 cm MITRAL VALVE MV Area (PHT): 2.42 cm    SHUNTS MV Decel Time: 313 msec    Systemic VTI:  0.17 m MV E velocity: 58.70 cm/s  Systemic Diam: 2.00 cm MV  A velocity: 86.30 cm/s MV E/A ratio:  0.68 Armida Lander MD Electronically signed by Armida Lander MD Signature Date/Time: 01/21/2024/6:45:56 PM    Final     Disposition: Status is: Inpatient Remains inpatient appropriate because: MRSA bacteremia, IV antibiotics, pneumonia, repeat cultures.  Planned Discharge Destination: Home     Time spent: 40 minutes  Author: Aisha Hove, MD 01/22/2024 3:41 PM Secure chat 7am to 7pm For on call review [email or web address].

## 2024-01-23 DIAGNOSIS — J189 Pneumonia, unspecified organism: Secondary | ICD-10-CM | POA: Diagnosis not present

## 2024-01-23 LAB — CBC
HCT: 32.9 % — ABNORMAL LOW (ref 39.0–52.0)
Hemoglobin: 10.9 g/dL — ABNORMAL LOW (ref 13.0–17.0)
MCH: 22.9 pg — ABNORMAL LOW (ref 26.0–34.0)
MCHC: 33.1 g/dL (ref 30.0–36.0)
MCV: 69.1 fL — ABNORMAL LOW (ref 80.0–100.0)
Platelets: 191 10*3/uL (ref 150–400)
RBC: 4.76 MIL/uL (ref 4.22–5.81)
RDW: 16.3 % — ABNORMAL HIGH (ref 11.5–15.5)
WBC: 7 10*3/uL (ref 4.0–10.5)
nRBC: 0 % (ref 0.0–0.2)

## 2024-01-23 LAB — BASIC METABOLIC PANEL WITH GFR
Anion gap: 10 (ref 5–15)
BUN: <5 mg/dL — ABNORMAL LOW (ref 6–20)
CO2: 26 mmol/L (ref 22–32)
Calcium: 8.2 mg/dL — ABNORMAL LOW (ref 8.9–10.3)
Chloride: 91 mmol/L — ABNORMAL LOW (ref 98–111)
Creatinine, Ser: 0.77 mg/dL (ref 0.61–1.24)
GFR, Estimated: >60 mL/min (ref >60)
Glucose, Bld: 66 mg/dL — ABNORMAL LOW (ref 70–99)
Potassium: 3.9 mmol/L (ref 3.5–5.1)
Sodium: 127 mmol/L — ABNORMAL LOW (ref 135–145)

## 2024-01-23 LAB — VANCOMYCIN, TROUGH: Vancomycin Tr: 9 ug/mL — ABNORMAL LOW (ref 15–20)

## 2024-01-23 MED ORDER — VANCOMYCIN HCL 1500 MG/300ML IV SOLN
1500.0000 mg | Freq: Two times a day (BID) | INTRAVENOUS | Status: DC
  Administered 2024-01-24 – 2024-01-28 (×8): 1500 mg via INTRAVENOUS
  Filled 2024-01-23 (×9): qty 300

## 2024-01-23 MED ORDER — POLYETHYLENE GLYCOL 3350 17 G PO PACK
17.0000 g | PACK | Freq: Two times a day (BID) | ORAL | Status: DC
  Administered 2024-01-23 – 2024-02-01 (×12): 17 g via ORAL
  Filled 2024-01-23 (×17): qty 1

## 2024-01-23 NOTE — Care Management Important Message (Signed)
 Important Message  Patient Details  Name: Jim Dunn MRN: [id number] Date of Birth: [birth year]   Important Message Given:  Yes - Medicare IM (copy mailed to address on file, reviewed with legal guardian Jim Dunn telephonically at [phone])     Neila Bally 01/23/2024, 11:13 AM

## 2024-01-23 NOTE — Progress Notes (Signed)
 Pharmacy Antibiotic Note  Jim Dunn is a 58 y.o. male admitted on 01/19/2024 with MRSA bacteremia.  Pharmacy has been consulted for vancomycin  dosing.  Continue ceftriaxone  and azithromycin  to compete 5 days for CAP.   Blood cultures recheck today.   Unfortunately vancomycin  peak was missed this morning or there was a lab error preventing it from resulting. Trough was low at 9, will make slight adjustment and recheck levels in a couple of days if needed.   Plan: -Increase Vancomycin  to 1500 mg q12h (eAUC 542, SCr 0.8, Vd 0.5)  - Will continue to monitor renal function, recheck vancomycin  levels if indicated  Height: 5\' 8"  (172.7 cm) Weight: 121.3 kg (267 lb 6.7 oz) IBW/kg (Calculated) : 68.4  Temp (24hrs), Avg:98.7 F (37.1 C), Min:98.4 F (36.9 C), Max:99 F (37.2 C)  Recent Labs  Lab 01/19/24 2235 01/20/24 0501 01/21/24 0408 01/22/24 0413 01/23/24 0545 01/23/24 1505  WBC 8.5 8.1  --   --  7.0  --   CREATININE 0.85 0.60* 0.80 0.55* 0.77  --   VANCOTROUGH  --   --   --   --   --  9*    Estimated Creatinine Clearance: 132.2 mL/min (by C-G formula based on SCr of 0.77 mg/dL).    No Known Allergies  Antimicrobials this admission: Vancomycin  5/19 >>  Ceftriaxone  5/17 >>  Azithromycin  5/17 >>5/21  Microbiology results: 5/18 BCx: MRSA  Thank you for allowing pharmacy to be a part of this patient's care.  Audra Blend PharmD., BCPS Clinical Pharmacist 01/23/2024 5:27 PM

## 2024-01-23 NOTE — Progress Notes (Signed)
 [facility] PROGRESS NOTE  Bryen Hinderman (DOB: 15-Mar-1966) ZOX:[id number] PCP: System, Provider Not In  Brief Narrative: Attila Mccarthy is a 58 y.o. male with a history of COPD, HFimpEF, PVD, HTN, HLD, seizure disorder, obesity (BMI 40), tobacco use who presented to the ED on 01/19/2024 who presented with cough, pleuritic chest pain and findings of pneumonia on CXR. Admitted for CAP, treated with antibiotics. Blood culture (1 of 4) grew MRSA for which ID was consulted. TTE showed no vegetation. Vancomycin  was initiated and surveillance blood cultures drawn 5/21.   Subjective: Wondering when he can go back home, his breathing is better, less cough. Feels better.   Objective: BP (!) 138/95 (BP Location: Left Arm)   Pulse 77   Temp 98.4 F (36.9 C) (Oral)   Resp 19   Ht 5\' 8"  (1.727 m)   Wt 121.3 kg   SpO2 96%   BMI 40.66 kg/m   Gen: No distress Pulm: Clear, nonlabored, nonproductive cough throughout encounter.   CV: RRR, no MRG or pitting LE edema GI: Soft, NT, ND, +BS Neuro: Alert and interactive. No new focal deficits. Ext: Warm, no deformities  Skin: No embolic stigmata.   Assessment & Plan: Principal Problem:   CAP (community acquired pneumonia) Active Problems:   Seizure disorder (HCC)   Chronic systolic CHF (congestive heart failure) (HCC)   Mixed hyperlipidemia   COPD (chronic obstructive pulmonary disease) (HCC)   Microcytic anemia   Hyponatremia   Essential hypertension   Obesity, Class III, BMI 40-49.9 (morbid obesity)  MRSA bacteremia: Aerobic bottle (1 of 4 total) on 5/18.  - Continue vancomycin  - Surveillance blood cultures redrawn 5/21 (NGTD) - TTE without vegetation - Appreciate ID recommendations going forward.     Community-acquired pneumonia:  - Complete 5 days CTX/azithro - IS, mucolytic.    COPD: Quiescent.  - prn nebs.   Hyponatremia: Has been waxing/waning. Has history of this.  - Monitor, avoid IVF as he's taking po   Chronic HFimpEF: BNP  29, appears euvolemic, echo shows EF 60%,  - Continue coreg , digoxin , entresto .   HTN:  - Continue norvasc , coreg , entresto .   HLD:  - Continue statin   Anxiety: Continue buspirone , prn alprazolam .   Seizure disorder: Continue home meds   Obesity class III: BMI 40.66 Advised diet, exercise and weight reduction.   Tobacco abuse:  - Cessation counseling provided - Nicotine  patch  [name], MD [facility] [email or web address] 01/23/2024, 2:51 PM

## 2024-01-24 DIAGNOSIS — J189 Pneumonia, unspecified organism: Secondary | ICD-10-CM | POA: Diagnosis not present

## 2024-01-24 LAB — CBC
HCT: 32.9 % — ABNORMAL LOW (ref 39.0–52.0)
Hemoglobin: 10.4 g/dL — ABNORMAL LOW (ref 13.0–17.0)
MCH: 21.7 pg — ABNORMAL LOW (ref 26.0–34.0)
MCHC: 31.6 g/dL (ref 30.0–36.0)
MCV: 68.5 fL — ABNORMAL LOW (ref 80.0–100.0)
Platelets: 191 10*3/uL (ref 150–400)
RBC: 4.8 MIL/uL (ref 4.22–5.81)
RDW: 15.9 % — ABNORMAL HIGH (ref 11.5–15.5)
WBC: 6.2 10*3/uL (ref 4.0–10.5)
nRBC: 0 % (ref 0.0–0.2)

## 2024-01-24 LAB — COMPREHENSIVE METABOLIC PANEL WITH GFR
ALT: 9 U/L (ref 0–44)
AST: 11 U/L — ABNORMAL LOW (ref 15–41)
Albumin: 3.3 g/dL — ABNORMAL LOW (ref 3.5–5.0)
Alkaline Phosphatase: 57 U/L (ref 38–126)
Anion gap: 9 (ref 5–15)
BUN: 7 mg/dL (ref 6–20)
CO2: 27 mmol/L (ref 22–32)
Calcium: 8.3 mg/dL — ABNORMAL LOW (ref 8.9–10.3)
Chloride: 89 mmol/L — ABNORMAL LOW (ref 98–111)
Creatinine, Ser: 0.62 mg/dL (ref 0.61–1.24)
GFR, Estimated: >60 mL/min (ref >60)
Glucose, Bld: 77 mg/dL (ref 70–99)
Potassium: 4.3 mmol/L (ref 3.5–5.1)
Sodium: 125 mmol/L — ABNORMAL LOW (ref 135–145)
Total Bilirubin: 0.6 mg/dL (ref 0.0–1.2)
Total Protein: 6.9 g/dL (ref 6.5–8.1)

## 2024-01-24 MED ORDER — SODIUM CHLORIDE 0.9% FLUSH: 3.0000 mL | INTRAVENOUS | Status: DC | PRN

## 2024-01-24 MED ORDER — ENOXAPARIN SODIUM 60 MG/0.6ML IJ SOSY
60.0000 mg | PREFILLED_SYRINGE | INTRAMUSCULAR | Status: DC
  Administered 2024-01-24 – 2024-02-01 (×8): 60 mg via SUBCUTANEOUS
  Filled 2024-01-24 (×10): qty 0.6

## 2024-01-24 MED ORDER — SODIUM CHLORIDE 0.9% FLUSH
3.0000 mL | Freq: Two times a day (BID) | INTRAVENOUS | Status: DC
Start: 2024-01-24 — End: 2024-01-31
  Administered 2024-01-24: 10 mL via INTRAVENOUS
  Administered 2024-01-25 (×2): 3 mL via INTRAVENOUS
  Administered 2024-01-26 – 2024-01-29 (×7): 10 mL via INTRAVENOUS
  Administered 2024-01-29: 3 mL via INTRAVENOUS
  Administered 2024-01-30: 10 mL via INTRAVENOUS
  Administered 2024-01-30: 3 mL via INTRAVENOUS

## 2024-01-24 NOTE — Progress Notes (Signed)
   [facility] has been requested to perform a transesophageal echocardiogram on Jim Dunn for bacteremia.     The patient does NOT have any absolute or relative contraindications to a Transesophageal Echocardiogram (TEE).  The patient has: No other conditions that may impact this procedure.    After careful review of history and examination, the risks and benefits of transesophageal echocardiogram have been explained including risks of esophageal damage, perforation (1:10,000 risk), bleeding, pharyngeal hematoma as well as other potential complications associated with conscious sedation including aspiration, arrhythmia, respiratory failure and death. Alternatives to treatment were discussed, questions were answered. Patient is willing to proceed.   Signed, Brentton Wardlow Rebekah Canada, PA-C  01/24/2024 10:31 AM

## 2024-01-24 NOTE — Progress Notes (Signed)
 [facility] PROGRESS NOTE  Jim Dunn (DOB: December 02, 1965) WUJ:[id number] PCP: System, Provider Not In  Brief Narrative: Jim Dunn is a 58 y.o. male with a history of COPD, HFimpEF, PVD, HTN, HLD, seizure disorder, obesity (BMI 40), tobacco use who presented to the ED on 01/19/2024 who presented with cough, pleuritic chest pain and findings of pneumonia on CXR. Admitted for CAP, treated with antibiotics. Blood culture (1 of 4) grew MRSA for which ID was consulted. TTE showed no vegetation. Vancomycin  was initiated and surveillance blood cultures drawn 5/21. TEE is pending.  Subjective: No new complaints, denies dyspnea, chest pain, abdominal pain. Tolerating po without issues. No fever/chills.   Objective: BP 132/79   Pulse 61   Temp 97.8 F (36.6 C) (Oral)   Resp 18   Ht 5\' 8"  (1.727 m)   Wt 121.3 kg   SpO2 92%   BMI 40.66 kg/m   Gen: No distress Pulm: Clear, nonlabored  CV: RRR no MRG GI: Soft, NT, ND, +BS Neuro: Alert and oriented. No new focal deficits. Ext: Warm, no deformities Skin: No new rashes, lesions or ulcers on visualized skin   Assessment & Plan: Principal Problem:   CAP (community acquired pneumonia) Active Problems:   Seizure disorder (HCC)   Chronic systolic CHF (congestive heart failure) (HCC)   Mixed hyperlipidemia   COPD (chronic obstructive pulmonary disease) (HCC)   Microcytic anemia   Hyponatremia   Essential hypertension   Obesity, Class III, BMI 40-49.9 (morbid obesity)  MRSA bacteremia: Aerobic bottle (1 of 4 total) on 5/18.  - Continue vancomycin , will discuss plans with ID and then with TOC Re: ability to administer at group home.  - Surveillance blood cultures redrawn 5/21 (remain NGTD), afebrile (Tmax 99.43F), WBC 6.2k.   - TTE without vegetation, d/w cardiology Dr. Nishan need for TEE, will arrange.  - Appreciate ID recommendations going forward.     Community-acquired pneumonia:  - Completed 5 days CTX/azithro - IS, mucolytic.     COPD: Quiescent.  - prn nebs.   Hyponatremia: Has been waxing/waning, appears asymptomatic, chronic problem - Monitor, avoid IVF as he's taking po   Chronic HFimpEF: BNP 29, appears euvolemic, echo shows EF 60%,  - Continue coreg , digoxin , entresto .   HTN:  - Continue norvasc , coreg , entresto .   HLD:  - Continue statin   Anxiety: Continue buspirone , prn alprazolam .   Seizure disorder: Continue home meds   Obesity class III: BMI 40.66 Advised diet, exercise and weight reduction.   Tobacco abuse:  - Cessation counseling provided - Nicotine  patch  [name], MD [facility] [email or web address] 01/24/2024, 10:24 AM

## 2024-01-24 NOTE — [place] FL2 (Signed)
 [place]  MEDICAID FL2 LEVEL OF CARE FORM     IDENTIFICATION  Patient Name: Jim Dunn Birthdate: 1966-03-08 Sex: male Admission Date (Current Location): 01/19/2024  [facility] and [place] Number:  [employer] and Address:  [facility],  [address] [place] [address]      Provider Number: [id number]  Attending Physician Name and Address:  [name], MD  Relative Name and Phone Number:  Lynkin, Saini (Legal Guardian)  [phone]    Current Level of Care: Hospital Recommended Level of Care: Skilled Nursing Facility (For IV antibiotics) Prior Approval Number:    Date Approved/Denied:   PASRR Number: Pending  Discharge Plan: SNF    Current Diagnoses: Patient Active Problem List   Diagnosis Date Noted   Obesity, Class III, BMI 40-49.9 (morbid obesity) 01/20/2024   CAP (community acquired pneumonia) 01/19/2024   Acute on chronic systolic CHF (congestive heart failure) (HCC) 05/03/2022   Essential hypertension 05/03/2022   Anxiety 05/03/2022   Dyslipidemia 05/03/2022   Sepsis due to undetermined organism (HCC) 05/03/2022   Obesity (BMI 30-39.9) 02/07/2022   Hyponatremia    Schizophrenia (HCC)    Peripheral neuropathy 10/20/2021   Dementia without behavioral disturbance (HCC) 09/25/2021   Needs assistance with community resources 09/12/2021   Disorganized schizophrenia (HCC)    Dementia (HCC) 06/08/2020   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   Chronic post-traumatic stress disorder (PTSD)    Noncompliance with medications    Subtherapeutic serum dilantin  level    Seizure (HCC)    Hypomagnesemia 02/23/2020   Suicidal ideation 02/23/2020   Paranoid schizophrenia with hallucinations    Hypokalemia    Chronic systolic CHF (congestive heart failure) (HCC) 01/22/2020   Mixed hyperlipidemia 01/22/2020   COPD (chronic obstructive pulmonary disease) (HCC) 01/22/2020   Microcytic anemia 01/22/2020   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   Seizure disorder (HCC) 01/22/2020    Orientation RESPIRATION BLADDER Height & Weight     Self, Time, Situation, Place  Normal Continent Weight: 121.3 kg Height:  5\' 8"  (172.7 cm)  BEHAVIORAL SYMPTOMS/MOOD NEUROLOGICAL BOWEL NUTRITION STATUS      Continent Diet (Regular)  AMBULATORY STATUS COMMUNICATION OF NEEDS Skin   Limited Assist Verbally Normal                       Personal Care Assistance Level of Assistance  Bathing, Feeding, Dressing Bathing Assistance: Limited assistance Feeding assistance: Independent Dressing Assistance: Limited assistance     Functional Limitations Info  Sight, Hearing, Speech Sight Info: Adequate Hearing Info: Adequate Speech Info: Adequate    SPECIAL CARE FACTORS FREQUENCY   (IV ABX)                    Contractures Contractures Info: Not present    Additional Factors Info  Code Status, Allergies Code Status Info: FULL Allergies Info: NKDA           Current Medications (01/24/2024):  This is the current hospital active medication list Current Facility-Administered Medications  Medication Dose Route Frequency Provider Last Rate Last Admin   acetaminophen  (TYLENOL ) tablet 650 mg  650 mg Oral Q6H PRN Adefeso, Oladapo, DO   650 mg at 01/20/24 0508   Or   acetaminophen  (TYLENOL ) suppository 650 mg  650 mg Rectal Q6H PRN Adefeso, Oladapo, DO       albuterol  (PROVENTIL ) (2.5 MG/3ML) 0.083% nebulizer solution 2.5 mg  2.5 mg Inhalation Q4H PRN Aisha Hove,  MD   2.5 mg at 01/23/24 1301   ALPRAZolam  (XANAX ) tablet 0.5 mg  0.5 mg Oral TID PRN Aisha Hove, MD   0.5 mg at 01/23/24 2143   atorvastatin  (LIPITOR) tablet 40 mg  40 mg Oral Daily Adefeso, Oladapo, DO   40 mg at 01/24/24 8657   busPIRone  (BUSPAR ) tablet 5 mg  5 mg Oral TID Sreeram, Narendranath, MD   5 mg at 01/24/24 8469   carvedilol  (COREG ) tablet 6.25 mg  6.25 mg Oral BID WC Sreeram, Narendranath, MD    6.25 mg at 01/24/24 6295   dextromethorphan -guaiFENesin  (MUCINEX  DM) 30-600 MG per 12 hr tablet 1 tablet  1 tablet Oral BID Adefeso, Oladapo, DO   1 tablet at 01/24/24 2841   digoxin  (LANOXIN ) tablet 0.25 mg  0.25 mg Oral Daily Adefeso, Oladapo, DO   0.25 mg at 01/24/24 3244   divalproex  (DEPAKOTE  ER) 24 hr tablet 500 mg  500 mg Oral BID Adefeso, Oladapo, DO   500 mg at 01/24/24 0102   enoxaparin  (LOVENOX ) injection 60 mg  60 mg Subcutaneous Q24H [name], MD   60 mg at 01/24/24 7253   FLUoxetine  (PROZAC ) capsule 20 mg  20 mg Oral Daily Sreeram, Narendranath, MD   20 mg at 01/24/24 6644   lidocaine  (LIDODERM ) 5 % 1 patch  1 patch Transdermal Q24H Adefeso, Oladapo, DO   1 patch at 01/22/24 2139   melatonin tablet 3 mg  3 mg Oral QHS Sreeram, Narendranath, MD   3 mg at 01/23/24 2144   mirtazapine  (REMERON ) tablet 15 mg  15 mg Oral QHS Sreeram, Narendranath, MD   15 mg at 01/23/24 2144   ondansetron  (ZOFRAN ) tablet 4 mg  4 mg Oral Q6H PRN Adefeso, Oladapo, DO       Or   ondansetron  (ZOFRAN ) injection 4 mg  4 mg Intravenous Q6H PRN Adefeso, Oladapo, DO       oxyCODONE -acetaminophen  (PERCOCET/ROXICET) 5-325 MG per tablet 1 tablet  1 tablet Oral Q4H PRN Aisha Hove, MD   1 tablet at 01/24/24 1127   polyethylene glycol (MIRALAX  / GLYCOLAX ) packet 17 g  17 g Oral BID [name], MD   17 g at 01/24/24 0347   senna-docusate (Senokot-S) tablet 2 tablet  2 tablet Oral BID Sreeram, Narendranath, MD   2 tablet at 01/24/24 0928   vancomycin  (VANCOREADY) IVPB 1500 mg/300 mL  1,500 mg Intravenous Q12H Olin Bertin, RPH 150 mL/hr at 01/24/24 0448 1,500 mg at 01/24/24 0448     Discharge Medications: Please see discharge summary for a list of discharge medications.  Relevant Imaging Results:  Relevant Lab Results:   Additional Information SS# [id number]                     Needs 4-6 weeks for IV antibiotics ( ID working on orders)  Darrold Bezek, RN

## 2024-01-24 NOTE — TOC Progression Note (Addendum)
 Transition of Care [facility]) - Progression Note    Patient Details  Name: Kamerin Grumbine MRN: [id number] Date of Birth: 26-May-1966  [facility]) CM/SW Contact  Orelia Binet, RN Phone Number: 01/24/2024, 10:43 AM  Clinical Narrative:   CM called Anibal Kent, legal Guardian. Patient chart updated. He lives at [facility] in [place], [place]. Add Marino Sias to contact, she is Engineer, site.  CM confirmed he can not return on IV ABX, they are only allowed to administer PO medications. Home health will not go out daily. MD confirmed patient will need 4-6 weeks of IV abx, CM discussed with Alica, she is agreeable to SNF for IV medication. She prefers [place] area to be closer to her, so she can go visit and take him out if allowed. TOC will complete FL2 and send out for bed offers.   Addendum: PASRR is pending, uploaded documents requested.    Expected Discharge Plan: Group Home Barriers to Discharge: Continued Medical Work up  Expected Discharge Plan and Services    Social Determinants of Health (SDOH) Interventions SDOH Screenings   Food Insecurity: No Food Insecurity (01/20/2024)  Housing: Low Risk  (01/20/2024)  Transportation Needs: No Transportation Needs (01/20/2024)  Utilities: Not At Risk (01/20/2024)  Social Connections: Socially Isolated (01/20/2024)  Tobacco Use: High Risk (01/20/2024)    Readmission Risk Interventions    01/18/2022    2:30 PM  Readmission Risk Prevention Plan  Transportation Screening Complete  Medication Review (RN Care Manager) Complete  PCP or Specialist appointment within 3-5 days of discharge Complete  HRI or Home Care Consult Complete  SW Recovery Care/Counseling Consult Complete  Palliative Care Screening Not Applicable  Skilled Nursing Facility Not Applicable

## 2024-01-24 NOTE — Plan of Care (Signed)
  Problem: Education: Goal: Knowledge of General Education information will improve Description: Including pain rating scale, medication(s)/side effects and non-pharmacologic comfort measures Outcome: Progressing   Problem: Health Behavior/Discharge Planning: Goal: Ability to manage health-related needs will improve Outcome: Progressing   Problem: Clinical Measurements: Goal: Ability to maintain clinical measurements within normal limits will improve Outcome: Progressing Goal: Will remain free from infection Outcome: Progressing Goal: Diagnostic test results will improve Outcome: Progressing Goal: Respiratory complications will improve Outcome: Progressing Goal: Cardiovascular complication will be avoided Outcome: Progressing   Problem: Elimination: Goal: Will not experience complications related to bowel motility Outcome: Progressing Goal: Will not experience complications related to urinary retention Outcome: Progressing   Problem: Pain Managment: Goal: General experience of comfort will improve and/or be controlled Outcome: Progressing   Problem: Safety: Goal: Ability to remain free from injury will improve Outcome: Progressing   Problem: Skin Integrity: Goal: Risk for impaired skin integrity will decrease Outcome: Progressing   Problem: Clinical Measurements: Goal: Ability to maintain a body temperature in the normal range will improve Outcome: Progressing   Problem: Respiratory: Goal: Ability to maintain adequate ventilation will improve Outcome: Progressing Goal: Ability to maintain a clear airway will improve Outcome: Progressing

## 2024-01-24 NOTE — Plan of Care (Signed)

## 2024-01-25 ENCOUNTER — Other Ambulatory Visit (HOSPITAL_COMMUNITY)

## 2024-01-25 ENCOUNTER — Encounter (HOSPITAL_COMMUNITY): Payer: Self-pay | Admitting: Anesthesiology

## 2024-01-25 ENCOUNTER — Other Ambulatory Visit (HOSPITAL_COMMUNITY): Payer: Self-pay | Admitting: *Deleted

## 2024-01-25 ENCOUNTER — Encounter (HOSPITAL_COMMUNITY)
Admission: EM | Disposition: A | Discharge status: Skilled Nursing Facility | Payer: Self-pay | Source: Home / Self Care | Attending: Family Medicine

## 2024-01-25 DIAGNOSIS — J189 Pneumonia, unspecified organism: Secondary | ICD-10-CM | POA: Diagnosis not present

## 2024-01-25 LAB — BASIC METABOLIC PANEL WITH GFR
Anion gap: 12 (ref 5–15)
Anion gap: 10 (ref 5–15)
BUN: 6 mg/dL (ref 6–20)
BUN: 6 mg/dL (ref 6–20)
CO2: 25 mmol/L (ref 22–32)
CO2: 26 mmol/L (ref 22–32)
Calcium: 8.7 mg/dL — ABNORMAL LOW (ref 8.9–10.3)
Calcium: 9 mg/dL (ref 8.9–10.3)
Chloride: 87 mmol/L — ABNORMAL LOW (ref 98–111)
Chloride: 88 mmol/L — ABNORMAL LOW (ref 98–111)
Creatinine, Ser: 0.62 mg/dL (ref 0.61–1.24)
Creatinine, Ser: 0.66 mg/dL (ref 0.61–1.24)
GFR, Estimated: >60 mL/min (ref >60)
GFR, Estimated: >60 mL/min (ref >60)
Glucose, Bld: 85 mg/dL (ref 70–99)
Glucose, Bld: 89 mg/dL (ref 70–99)
Potassium: 3.9 mmol/L (ref 3.5–5.1)
Potassium: 3.9 mmol/L (ref 3.5–5.1)
Sodium: 124 mmol/L — ABNORMAL LOW (ref 135–145)
Sodium: 124 mmol/L — ABNORMAL LOW (ref 135–145)

## 2024-01-25 LAB — CULTURE, BLOOD (ROUTINE X 2)
Culture: NO GROWTH
Special Requests: ADEQUATE

## 2024-01-25 LAB — OSMOLALITY, URINE: Osmolality, Ur: 416 mosm/kg (ref 300–900)

## 2024-01-25 LAB — OSMOLALITY: Osmolality: 269 mosm/kg — ABNORMAL LOW (ref 275–295)

## 2024-01-25 LAB — CREATININE, SERUM
Creatinine, Ser: 0.64 mg/dL (ref 0.61–1.24)
GFR, Estimated: >60 mL/min (ref >60)

## 2024-01-25 LAB — SODIUM, URINE, RANDOM: Sodium, Ur: 80 mmol/L

## 2024-01-25 SURGERY — ECHOCARDIOGRAM, TRANSESOPHAGEAL
Anesthesia: Monitor Anesthesia Care

## 2024-01-25 NOTE — Progress Notes (Signed)
 Mobility Specialist Progress Note:    01/25/24 1005  Mobility  Activity Ambulated with assistance in hallway  Level of Assistance Modified independent, requires aide device or extra time  Assistive Device None  Distance Ambulated (ft) 200 ft  Range of Motion/Exercises Active;All extremities  Activity Response Tolerated well  Mobility Referral Yes  Mobility visit 1 Mobility  Mobility Specialist Start Time (ACUTE ONLY) 1005  Mobility Specialist Stop Time (ACUTE ONLY) 1025  Mobility Specialist Time Calculation (min) (ACUTE ONLY) 20 min   Pt received in bed agreeable to mobility. ModI to stand and ambulate with no AD. Tolerated well,asx throughout. Returned pt supine, all needs met.  Adyn Serna Mobility Specialist Please contact via Special educational needs teacher or  Rehab office at [phone]

## 2024-01-25 NOTE — TOC Progression Note (Signed)
 Transition of Care [facility]) - Progression Note    Patient Details  Name: Jim Dunn MRN: [id number] Date of Birth: 1966/07/09  [facility]) CM/SW Contact  Ander Katos, [place] Phone Number: 01/25/2024, 2:05 PM  Clinical Narrative:  30 day PASRR received. Bed offer made at [facility]. Guardian notified and will tour facility. Aware no other offers at this time. LCSW spoke with Fabian Holster at [employer]. Can accept pt on Monday if ready after TEE. TOC will follow.       Expected Discharge Plan: Group Home Barriers to Discharge: Continued Medical Work up  Expected Discharge Plan and Services                                               Social Determinants of Health (SDOH) Interventions SDOH Screenings   Food Insecurity: No Food Insecurity (01/20/2024)  Housing: Low Risk  (01/20/2024)  Transportation Needs: No Transportation Needs (01/20/2024)  Utilities: Not At Risk (01/20/2024)  Social Connections: Socially Isolated (01/20/2024)  Tobacco Use: High Risk (01/20/2024)    Readmission Risk Interventions    01/18/2022    2:30 PM  Readmission Risk Prevention Plan  Transportation Screening Complete  Medication Review (RN Care Manager) Complete  PCP or Specialist appointment within 3-5 days of discharge Complete  HRI or Home Care Consult Complete  SW Recovery Care/Counseling Consult Complete  Palliative Care Screening Not Applicable  Skilled Nursing Facility Not Applicable

## 2024-01-25 NOTE — Care Management Important Message (Signed)
 Important Message  Patient Details  Name: Vitor Overbaugh MRN: [id number] Date of Birth: 07-04-1966   Important Message Given:  Yes - Medicare IM (copy mailed to Gonzalo Lather at address on file)     Neila Bally 01/25/2024, 11:54 AM

## 2024-01-25 NOTE — Progress Notes (Signed)
 Discussed case with anesthesia, due to hyponatremia poor candidate for anesthesia at this time. We will cancel TEE for today. May contact us  back when hyponatremia has resolved   Letta Raw MD

## 2024-01-25 NOTE — Plan of Care (Signed)

## 2024-01-25 NOTE — Plan of Care (Signed)
   Problem: Education: Goal: Knowledge of General Education information will improve Description Including pain rating scale, medication(s)/side effects and non-pharmacologic comfort measures Outcome: Progressing   Problem: Health Behavior/Discharge Planning: Goal: Ability to manage health-related needs will improve Outcome: Progressing

## 2024-01-25 NOTE — Progress Notes (Signed)
 [facility] PROGRESS NOTE  Jim Dunn (DOB: [birth year]) BJY:[id number] PCP: System, Provider Not In  Brief Narrative: Jim Dunn is a 58 y.o. male with a history of COPD, HFimpEF, PVD, HTN, HLD, seizure disorder, obesity (BMI 40), tobacco use who presented to the ED on 01/19/2024 who presented with cough, pleuritic chest pain and findings of pneumonia on CXR. Admitted for CAP, treated with antibiotics. Blood culture (1 of 4) grew MRSA for which ID was consulted. TTE showed no vegetation. Vancomycin  was initiated and surveillance blood cultures drawn 5/21. TEE is pending on 5/23 to inform duration of antibiotics. Since at least 2 weeks of vancomycin  will be required, plan is to pursue short term placement as his group home cannot administer IV antibiotics.   Subjective: No complaints, no subjective fever, feels fine, though temp this AM is 99.676F . Hungry.  Objective: BP (!) 144/69   Pulse 61   Temp 99.2 F (37.3 C) (Oral)   Resp 18   Ht 5\' 8"  (1.727 m)   Wt 121.3 kg   SpO2 97%   BMI 40.66 kg/m   Gen: No distress Pulm: Clear, nonlabored  CV: RRR, no MRG or edema GI: Soft, NT, ND, +BS  Neuro: Alert and oriented. No new focal deficits. Ext: Warm, no deformities. Skin: No new rashes, lesions or ulcers on visualized skin   Assessment & Plan: Principal Problem:   CAP (community acquired pneumonia) Active Problems:   Seizure disorder (HCC)   Chronic systolic CHF (congestive heart failure) (HCC)   Mixed hyperlipidemia   COPD (chronic obstructive pulmonary disease) (HCC)   Microcytic anemia   Hyponatremia   Essential hypertension   Obesity, Class III, BMI 40-49.9 (morbid obesity)  MRSA bacteremia: Aerobic bottle (1 of 4 total) on 5/18.  - Continue vancomycin  with minimum duration 2 weeks (would be 5/19-6/1) unable to be administered at group home, so we're pursuing short term placement. Would insert PICC vs. midline depending on timing.  - Surveillance blood cultures redrawn  5/21 (remain NGTD at 2 days), afebrile (Tmax 99.676F), WBC 6.2k.   - TTE without vegetation. TEE planned 5/23, keep NPO pending this.  - Appreciate ID recommendations going forward.     Community-acquired pneumonia:  - Completed 5 days CTX/azithro - IS, mucolytic.    COPD: Quiescent.  - prn nebs.   Hyponatremia: Has been waxing/waning, appears asymptomatic, chronic problem - Monitor, avoid IVF as he's taking po   Chronic HFimpEF: BNP 29, appears euvolemic, echo shows EF 60%,  - Continue coreg , digoxin , entresto .   HTN:  - Continue norvasc , coreg , entresto .   HLD:  - Continue statin   Anxiety: Continue buspirone , prn alprazolam .   Seizure disorder: Continue home meds   Obesity class III: BMI 40.66 Advised diet, exercise and weight reduction.   Tobacco abuse:  - Cessation counseling provided - Nicotine  patch  [name], MD [facility] [email or web address] 01/25/2024, 8:49 AM

## 2024-01-25 NOTE — H&P (Signed)
 Procedure H&P  Patient presents for inpatient transesophageal echocardiogram in the setting of bacteremia. For full medical history please see referenced hospital note below. Plan for TEE today with the assistance of anesthesiology   Armida Lander MD     History and Physical      Patient: Jim Dunn ZOX:[id number] DOB: August 29, 1966 DOA: 01/19/2024 DOS: the patient was seen and examined on 01/20/2024 PCP: System, Provider Not In  Patient coming from: Home   Chief Complaint: Chest pain   HPI: Jim Dunn is a 58 y.o. male with medical history significant of COPD, CHF, seizure disorder, pulm vascular failure, tobacco abuse who presents emergency department due to right-sided chest pain that woke him up this morning.  Pain was sharp, pleuritic, nonradiating and was associated with productive cough.  Patient continues to smoke.  He denies nausea, vomiting, shortness of breath.   ED Course:  In the emergency department, he was hemodynamically stable.  Workup in the ED showed microcytic anemia.  BMP was normal except for hyponatremia (123), chloride 86, glucose 131.  Troponin times was flat at 5. Chest x-ray showed cardiomegaly with central pulmonary vascular congestion.  Mild patchy airspace opacities in the bilateral lower lungs may represent edema or infection. Patient was treated with IV ceftriaxone  and azithromycin , breathing treatment was provided, IV morphine  4 mg x 1 was given, IV hydration was provided.  [facility] was asked to admit patient.   Review of Systems: Review of systems as noted in the HPI. All other systems reviewed and are negative.         Past Medical History:  Diagnosis Date   CHF (congestive heart failure) (HCC)     COPD (chronic obstructive pulmonary disease) (HCC)     History of kidney stones     Hyponatremia     Paranoid schizophrenia (HCC)     Seizures (HCC)     Tobacco abuse               Past Surgical History:  Procedure Laterality Date   KIDNEY STONE  SURGERY       MULTIPLE EXTRACTIONS WITH ALVEOLOPLASTY Bilateral 04/29/2020    Procedure: Extraction of tooth #'s 2-13, 17,18, 21-25, and 27-31 with alveoloplasty and bilateral lingual exostoses reductions.;  Surgeon: Carol Chroman, DDS;  Location: [facility] OR;  Service: Oral Surgery;  Laterality: Bilateral;          Social History:  reports that he has been smoking cigarettes. He has quit using smokeless tobacco. He reports that he does not currently use alcohol. He reports that he does not use drugs.     Allergies  No Known Allergies          Family History  Problem Relation Age of Onset   Heart disease Other                   Prior to Admission medications   Medication Sig Start Date End Date Taking? Authorizing Provider  ALPRAZolam  (XANAX ) 1 MG tablet Take 1 tablet (1 mg total) by mouth 2 (two) times daily. 04/06/22     [name], MD  amLODipine  (NORVASC ) 10 MG tablet Take 1 tablet (10 mg total) by mouth daily. 04/06/22     [name], MD  atorvastatin  (LIPITOR) 40 MG tablet Take 1 tablet (40 mg total) by mouth daily. 04/06/22     [name], MD  busPIRone  (BUSPAR ) 10 MG tablet Take 1 tablet (10 mg total) by mouth 3 (three) times daily.  Patient taking differently: Take 15 mg by mouth 3 (three) times daily. 04/06/22     [name], MD  carvedilol  (COREG ) 25 MG tablet Take 1 tablet (25 mg total) by mouth 2 (two) times daily with a meal. 04/06/22     [name], MD  digoxin  (LANOXIN ) 0.25 MG tablet Take 1 tablet (0.25 mg total) by mouth daily. 04/06/22     [name], MD  divalproex  (DEPAKOTE  ER) 500 MG 24 hr tablet Take 1 tablet (500 mg total) by mouth 2 (two) times daily. 04/06/22     [name], MD  melatonin 3 MG TABS tablet Take 1 tablet (3 mg total) by mouth at bedtime. 04/06/22     [name], MD  mirtazapine  (REMERON ) 15 MG tablet Take 1 tablet (15 mg total) by mouth at bedtime. 04/06/22     [name], MD  paliperidone  (INVEGA  SUSTENNA) 234 MG/1.5ML injection  Inject 234 mg into the muscle every 30 (thirty) days.       [provider]  polyethylene glycol powder (GLYCOLAX /MIRALAX ) 17 GM/SCOOP powder Dissolve 17 gm in liquid and take by mouth 2 (two) times daily. 04/06/22     [name], MD  pregabalin  (LYRICA ) 75 MG capsule Take 1 capsule (75 mg total) by mouth 2 (two) times daily. Patient taking differently: Take 150 mg by mouth 2 (two) times daily. 04/06/22     [name], MD  QUEtiapine  (SEROQUEL ) 50 MG tablet Take 1 tablet (50 mg total) by mouth at bedtime. 04/06/22     [name], MD  sacubitril -valsartan  (ENTRESTO ) 97-103 MG Take 1 tablet by mouth 2 (two) times daily.       [provider]  senna-docusate (SENOKOT-S) 8.6-50 MG tablet Take 2 tablets by mouth 2 (two) times daily. 04/06/22     [name], MD  sodium chloride  1 g tablet Take 1 tablet (1 g total) by mouth 3 (three) times daily with meals. 04/06/22     [name], MD      Physical Exam: BP 127/75 (BP Location: Right Arm)   Pulse 68   Temp 97.7 F (36.5 C)   Resp 20   Ht 5\' 8"  (1.727 m)   Wt 121.3 kg   SpO2 96%   BMI 40.66 kg/m    General: 58 y.o. year-old male well developed well nourished in no acute distress.  Alert and oriented x3. HEENT: NCAT, EOMI Neck: Supple, trachea medial Cardiovascular: Regular rate and rhythm with no rubs or gallops.  No thyromegaly or JVD noted.  No lower extremity edema. 2/4 pulses in all 4 extremities. Respiratory: Diffuse coarse breath sounds with minimal scattered expiratory wheezing.  Abdomen: Soft, nontender nondistended with normal bowel sounds x4 quadrants. Muskuloskeletal: No cyanosis, clubbing or edema noted bilaterally Neuro: CN II-XII intact, strength 5/5 x 4, sensation, reflexes intact Skin: No ulcerative lesions noted or rashes Psychiatry: Judgement and insight appear normal. Mood is appropriate for condition and setting                Labs on Admission:  Basic Metabolic Panel: Last Labs     Recent  Labs  Lab 01/19/24 2235  NA 123*  K 4.8  CL 86*  CO2 27  GLUCOSE 131*  BUN 8  CREATININE 0.85  CALCIUM  8.5*      Liver Function Tests: Last Labs     Recent Labs  Lab 01/19/24 2235  AST 24  ALT  8  ALKPHOS 67  BILITOT 1.2  PROT 7.3  ALBUMIN 3.5      Last Labs  No results for input(s): "LIPASE", "AMYLASE" in the last 168 hours.   Last Labs  No results for input(s): "AMMONIA" in the last 168 hours.   CBC: Last Labs     Recent Labs  Lab 01/19/24 2235  WBC 8.5  HGB 11.1*  HCT 33.2*  MCV 68.7*  PLT 213      Cardiac Enzymes: Last Labs  No results for input(s): "CKTOTAL", "CKMB", "CKMBINDEX", "TROPONINI" in the last 168 hours.     BNP (last 3 results) Recent Labs (within last 365 days)  No results for input(s): "BNP" in the last 8760 hours.     ProBNP (last 3 results) Recent Labs (within last 365 days)  No results for input(s): "PROBNP" in the last 8760 hours.     CBG: Last Labs  No results for input(s): "GLUCAP" in the last 168 hours.     Radiological Exams on Admission:  Imaging Results (Last 48 hours)  DG Chest 2 View Result Date: 01/19/2024 CLINICAL DATA:  Chest pain EXAM: CHEST - 2 VIEW COMPARISON:  Chest x-ray 07/27/2023 FINDINGS: The heart is enlarged. There is central pulmonary vascular congestion. There are mild patchy airspace opacities in the bilateral lower lungs. There is no pleural effusion or pneumothorax. No acute fractures are seen. IMPRESSION: 1. Cardiomegaly with central pulmonary vascular congestion. 2. Mild patchy airspace opacities in the bilateral lower lungs may represent edema or infection. Electronically Signed   By: Tyron Gallon M.D.   On: 01/19/2024 23:03       EKG: I independently viewed the EKG done and my findings are as followed: Normal sinus rhythm at a rate of 63 bpm   Assessment/Plan Present on Admission:  CAP (community acquired pneumonia)  COPD (chronic obstructive pulmonary disease) (HCC)  Hyponatremia   Microcytic anemia  Chronic systolic CHF (congestive heart failure) (HCC)  Mixed hyperlipidemia  Essential hypertension   Principal Problem:   CAP (community acquired pneumonia) Active Problems:   Seizure disorder (HCC)   Chronic systolic CHF (congestive heart failure) (HCC)   Mixed hyperlipidemia   COPD (chronic obstructive pulmonary disease) (HCC)   Microcytic anemia   Hyponatremia   Essential hypertension   Obesity, Class III, BMI 40-49.9 (morbid obesity)   CAP POA Chest x-ray was suggestive of pneumonia Patient was started on ceftriaxone  and azithromycin , we shall continue same at this time with plan to de-escalate/discontinue based on blood culture, sputum culture, urine Legionella, strep pneumo and procalcitonin Continue Tylenol  as needed Continue lidocaine  patch on right side of chest Continue Mucinex , incentive spirometry, flutter valve    COPD (not in acute exacerbation) Continue duo nebs, Mucinex  Continue incentive spirometry and flutter valve   Hyponatremia Na 123 Urine osmolality, serum osmolality and urine sodium will be checked   Microcytic anemia MCV 68.7, hemoglobin 11.1, hematocrit 33.2 Iron studies will be done   Morbid obesity (BMI 40.66) Patient was counseled about the cardiovascular and metabolic risk of morbid obesity. Patient was counseled for diet control, exercise regimen and weight loss.    Chronic systolic CHF Continue home meds   Mixed hyperlipidemia Continue statin   Essential hypertension Continue Norvasc , Coreg , Entresto    Seizure disorder Continue home meds     DVT prophylaxis: Lovenox    Code Status: Full code   Family Communication: None at bedside   Consults: None   Severity of Illness: The appropriate patient status for this  patient is INPATIENT. Inpatient status is judged to be reasonable and necessary in order to provide the required intensity of service to ensure the patient's safety. The patient's presenting symptoms,  physical exam findings, and initial radiographic and laboratory data in the context of their chronic comorbidities is felt to place them at high risk for further clinical deterioration. Furthermore, it is not anticipated that the patient will be medically stable for discharge from the hospital within 2 midnights of admission.    * I certify that at the point of admission it is my clinical judgment that the patient will require inpatient hospital care spanning beyond 2 midnights from the point of admission due to high intensity of service, high risk for further deterioration and high frequency of surveillance required.*   Author: Oladapo Adefeso, DO 01/20/2024 5:48 AM

## 2024-01-25 NOTE — Anesthesia Preprocedure Evaluation (Addendum)
 Anesthesia Evaluation  Patient identified by MRN, date of birth, ID band Patient awake    Reviewed: Allergy & Precautions, H&P , NPO status , Patient's Chart, lab work & pertinent test results  Airway        Dental   Pulmonary pneumonia, COPD, Current Smoker          Cardiovascular hypertension, +CHF  (-) dysrhythmias   EF 55-60%   Neuro/Psych Seizures -,  PSYCHIATRIC DISORDERS Anxiety   Schizophrenia Dementia  Neuromuscular disease    GI/Hepatic negative GI ROS, Neg liver ROS,,,  Endo/Other  negative endocrine ROS    Renal/GU negative Renal ROS  negative genitourinary   Musculoskeletal negative musculoskeletal ROS (+)    Abdominal   Peds negative pediatric ROS (+)  Hematology  (+) Blood dyscrasia, anemia   Anesthesia Other Findings 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

## 2024-01-25 NOTE — Progress Notes (Signed)
 DOB: 09/11/1965 Date: 01/25/24   Must ID: [id number]    To Whom it May Concern:  Please be advised that the above named patient will require a short-term nursing home stay- anticipated 30 days or less rehabilitation and strengthening. The plan is for return home.

## 2024-01-26 DIAGNOSIS — J189 Pneumonia, unspecified organism: Secondary | ICD-10-CM | POA: Diagnosis not present

## 2024-01-26 LAB — BASIC METABOLIC PANEL WITH GFR
Anion gap: 8 (ref 5–15)
BUN: 5 mg/dL — ABNORMAL LOW (ref 6–20)
CO2: 26 mmol/L (ref 22–32)
Calcium: 8.6 mg/dL — ABNORMAL LOW (ref 8.9–10.3)
Chloride: 90 mmol/L — ABNORMAL LOW (ref 98–111)
Creatinine, Ser: 0.66 mg/dL (ref 0.61–1.24)
GFR, Estimated: >60 mL/min (ref >60)
Glucose, Bld: 109 mg/dL — ABNORMAL HIGH (ref 70–99)
Potassium: 3.7 mmol/L (ref 3.5–5.1)
Sodium: 124 mmol/L — ABNORMAL LOW (ref 135–145)

## 2024-01-26 MED ORDER — SODIUM CHLORIDE 1 G PO TABS
1.0000 g | ORAL_TABLET | Freq: Three times a day (TID) | ORAL | Status: DC
  Administered 2024-01-26 – 2024-02-01 (×18): 1 g via ORAL
  Filled 2024-01-26 (×18): qty 1

## 2024-01-26 NOTE — Progress Notes (Signed)
 [facility] PROGRESS NOTE  Jim Dunn (DOB: Jan 29, 1966) AVW:[id number] PCP: System, Provider Not In  Brief Narrative: Jim Dunn is a 58 y.o. male with a history of COPD, HFimpEF, PVD, HTN, HLD, seizure disorder, obesity (BMI 40), tobacco use who presented to the ED on 01/19/2024 who presented with cough, pleuritic chest pain and findings of pneumonia on CXR. Admitted for CAP, treated with antibiotics. Blood culture (1 of 4) grew MRSA for which ID was consulted. TTE showed no vegetation. Vancomycin  was initiated and surveillance blood cultures drawn 5/21. TEE is pending on 5/23 to inform duration of antibiotics. Since at least 2 weeks of vancomycin  will be required, plan is to pursue short term placement as his group home cannot administer IV antibiotics.   Subjective: Sitting EOB, has been ambulating and eating well. No swelling, chest pain, seizure activity per pt nor reported. Legal guardian updated 5/23.   Objective: BP 113/61   Pulse 67   Temp 98.9 F (37.2 C) (Oral)   Resp 18   Ht 5\' 8"  (1.727 m)   Wt 121.3 kg   SpO2 95%   BMI 40.66 kg/m   Gen: No distress Pulm: Clear, nonlabored  CV: RRR, no MRG, no LE edema GI: Soft, NT, ND, +BS  Neuro: Alert and calm, cooperative. No new focal deficits. Ext: Warm, no deformities Skin: No new rashes, lesions or ulcers on visualized skin   Assessment & Plan: Principal Problem:   CAP (community acquired pneumonia) Active Problems:   Seizure disorder (HCC)   Chronic systolic CHF (congestive heart failure) (HCC)   Mixed hyperlipidemia   COPD (chronic obstructive pulmonary disease) (HCC)   Microcytic anemia   Hyponatremia   Essential hypertension   Obesity, Class III, BMI 40-49.9 (morbid obesity)  MRSA bacteremia: Aerobic bottle (1 of 4 total) on 5/18.  - Continue vancomycin  with minimum duration 2 weeks (would be 5/19-6/1) unable to be administered at group home, so we're pursuing short term placement. Would insert PICC vs.  midline depending on timing.  - Surveillance blood cultures redrawn 5/21 (remain NGTD at 3 days), afebrile (Tmax 98.13F), WBC 6.2k.   - TTE without vegetation. TEE postponed due to hyponatremia, hopeful to have this performed early next week  - Appreciate ID recommendations going forward.     Community-acquired pneumonia:  - Completed 5 days CTX/azithro - IS, mucolytic.    COPD: Quiescent.  - prn nebs.   Hyponatremia: Has been waxing/waning, appears asymptomatic, chronic problem. Nadir 124, stable on recheck. Labs consistent with SIADH.  - Institute fluid restriction, give salt tabs, monitor serially.    Chronic HFimpEF: BNP 29, appears euvolemic, echo shows EF 60%,  - Continue coreg , digoxin , entresto .   HTN:  - Continue norvasc , coreg , entresto .   HLD:  - Continue statin   Anxiety: Continue buspirone , prn alprazolam .   Seizure disorder: Continue home meds   Obesity class III: BMI 40.66 Advised diet, exercise and weight reduction.   Tobacco abuse:  - Cessation counseling provided - Nicotine  patch  [name], MD [facility] [email or web address] 01/26/2024, 12:41 PM

## 2024-01-26 NOTE — Progress Notes (Signed)
 Mobility Specialist Progress Note:    01/26/24 1115  Mobility  Activity Ambulated with assistance in hallway  Level of Assistance Modified independent, requires aide device or extra time  Assistive Device None  Distance Ambulated (ft) 200 ft  Range of Motion/Exercises Active;All extremities  Activity Response Tolerated well  Mobility Referral Yes  Mobility visit 1 Mobility  Mobility Specialist Start Time (ACUTE ONLY) 1115  Mobility Specialist Stop Time (ACUTE ONLY) 1135  Mobility Specialist Time Calculation (min) (ACUTE ONLY) 20 min   Pt received in bed, agreeable to mobility. ModI to stand and ambulate with no AD. Tolerated well,asx throughout. Returned pt supine, all needs met.  Dorene Bruni Mobility Specialist Please contact via Special educational needs teacher or  Rehab office at [phone]

## 2024-01-26 NOTE — Plan of Care (Signed)
  Problem: Clinical Measurements: Goal: Respiratory complications will improve Outcome: Progressing   Problem: Activity: Goal: Risk for activity intolerance will decrease Outcome: Progressing   Problem: Nutrition: Goal: Adequate nutrition will be maintained Outcome: Progressing   

## 2024-01-27 DIAGNOSIS — J189 Pneumonia, unspecified organism: Secondary | ICD-10-CM | POA: Diagnosis not present

## 2024-01-27 LAB — BASIC METABOLIC PANEL WITH GFR
Anion gap: 13 (ref 5–15)
BUN: 6 mg/dL (ref 6–20)
CO2: 25 mmol/L (ref 22–32)
Calcium: 8.4 mg/dL — ABNORMAL LOW (ref 8.9–10.3)
Chloride: 91 mmol/L — ABNORMAL LOW (ref 98–111)
Creatinine, Ser: 0.58 mg/dL — ABNORMAL LOW (ref 0.61–1.24)
GFR, Estimated: >60 mL/min (ref >60)
Glucose, Bld: 75 mg/dL (ref 70–99)
Potassium: 4 mmol/L (ref 3.5–5.1)
Sodium: 129 mmol/L — ABNORMAL LOW (ref 135–145)

## 2024-01-27 LAB — OSMOLALITY: Osmolality: 272 mosm/kg — ABNORMAL LOW (ref 275–295)

## 2024-01-27 NOTE — Plan of Care (Signed)
  Problem: Health Behavior/Discharge Planning: Goal: Ability to manage health-related needs will improve Outcome: Progressing   Problem: Clinical Measurements: Goal: Ability to maintain clinical measurements within normal limits will improve Outcome: Progressing Goal: Will remain free from infection Outcome: Progressing Goal: Diagnostic test results will improve Outcome: Progressing   Problem: Activity: Goal: Risk for activity intolerance will decrease Outcome: Progressing   Problem: Coping: Goal: Level of anxiety will decrease Outcome: Progressing   Problem: Elimination: Goal: Will not experience complications related to urinary retention Outcome: Progressing   Problem: Pain Managment: Goal: General experience of comfort will improve and/or be controlled Outcome: Progressing   Problem: Safety: Goal: Ability to remain free from injury will improve Outcome: Progressing   Problem: Skin Integrity: Goal: Risk for impaired skin integrity will decrease Outcome: Progressing   Problem: Activity: Goal: Ability to tolerate increased activity will improve Outcome: Progressing

## 2024-01-27 NOTE — Progress Notes (Signed)
 [facility] PROGRESS NOTE  Jim Dunn (DOB: Oct 15, 1965) ZOX:[id number] PCP: System, Provider Not In  Brief Narrative: Jim Dunn is a 58 y.o. male with a history of COPD, HFimpEF, PVD, HTN, HLD, seizure disorder, obesity (BMI 40), tobacco use who presented to the ED on 01/19/2024 who presented with cough, pleuritic chest pain and findings of pneumonia on CXR. Admitted for CAP, treated with antibiotics. Blood culture (1 of 4) grew MRSA for which ID was consulted. TTE showed no vegetation. Vancomycin  was initiated and surveillance blood cultures drawn 5/21. TEE is pending on 5/23 to inform duration of antibiotics. Since at least 2 weeks of vancomycin  will be required, plan is to pursue short term placement as his group home cannot administer IV antibiotics.   Subjective: Resting quietly without complaints.   Objective: BP (!) (P) 158/70 (BP Location: Left Arm)   Pulse 71   Temp (P) 98.1 F (36.7 C) (Oral)   Resp (P) 20   Ht 5\' 8"  (1.727 m)   Wt 121.3 kg   SpO2 (P) 95%   BMI 40.66 kg/m   Exam deferred due to patient preference he was in no distress breathing comfortably and without LE edema.   Assessment & Plan: Principal Problem:   CAP (community acquired pneumonia) Active Problems:   Seizure disorder (HCC)   Chronic systolic CHF (congestive heart failure) (HCC)   Mixed hyperlipidemia   COPD (chronic obstructive pulmonary disease) (HCC)   Microcytic anemia   Hyponatremia   Essential hypertension   Obesity, Class III, BMI 40-49.9 (morbid obesity)  MRSA bacteremia: Aerobic bottle (1 of 4 total) on 5/18.  - Continue vancomycin  with minimum duration 2 weeks (would be 5/19-6/1) unable to be administered at group home, so we're pursuing short term placement. Would insert PICC vs. midline depending on timing.  - Surveillance blood cultures redrawn 5/21 (remain NGTD at 4 days). - TTE without vegetation. TEE postponed due to hyponatremia, hopeful to have this performed early next  week  - Appreciate ID recommendations going forward.     Community-acquired pneumonia:  - Completed 5 days CTX/azithro - IS, mucolytic.    COPD: Quiescent.  - prn nebs.   Hyponatremia: Has been waxing/waning, appears asymptomatic, chronic problem. Nadir 124, stable on recheck. Labs consistent with SIADH.  - Institute fluid restriction, give salt tabs, monitor serially. Lab analyzer malfunction is delaying AM labs, will address based on labs when available.    Chronic HFimpEF: BNP 29, appears euvolemic, echo shows EF 60%,  - Continue coreg , digoxin , entresto .   HTN:  - Continue norvasc , coreg , entresto .   HLD:  - Continue statin   Anxiety: Continue buspirone , prn alprazolam .   Seizure disorder: Continue home meds   Obesity class III: BMI 40.66 Advised diet, exercise and weight reduction.   Tobacco abuse:  - Cessation counseling provided - Nicotine  patch  [name], MD [facility] [email or web address] 01/27/2024, 10:00 AM

## 2024-01-28 DIAGNOSIS — J189 Pneumonia, unspecified organism: Secondary | ICD-10-CM | POA: Diagnosis not present

## 2024-01-28 LAB — BASIC METABOLIC PANEL WITH GFR
Anion gap: 12 (ref 5–15)
BUN: 6 mg/dL (ref 6–20)
CO2: 23 mmol/L (ref 22–32)
Calcium: 8.7 mg/dL — ABNORMAL LOW (ref 8.9–10.3)
Chloride: 96 mmol/L — ABNORMAL LOW (ref 98–111)
Creatinine, Ser: 0.61 mg/dL (ref 0.61–1.24)
GFR, Estimated: >60 mL/min (ref >60)
Glucose, Bld: 75 mg/dL (ref 70–99)
Potassium: 3.8 mmol/L (ref 3.5–5.1)
Sodium: 131 mmol/L — ABNORMAL LOW (ref 135–145)

## 2024-01-28 LAB — VANCOMYCIN, TROUGH: Vancomycin Tr: 12 ug/mL — ABNORMAL LOW (ref 15–20)

## 2024-01-28 LAB — CULTURE, BLOOD (ROUTINE X 2)
Culture: NO GROWTH
Culture: NO GROWTH
Special Requests: ADEQUATE
Special Requests: ADEQUATE

## 2024-01-28 LAB — VANCOMYCIN, PEAK: Vancomycin Pk: 20 ug/mL — ABNORMAL LOW (ref 30–40)

## 2024-01-28 MED ORDER — NICOTINE 21 MG/24HR TD PT24
21.0000 mg | MEDICATED_PATCH | Freq: Every day | TRANSDERMAL | Status: DC
  Administered 2024-01-28: 21 mg via TRANSDERMAL
  Filled 2024-01-28 (×4): qty 1

## 2024-01-28 MED ORDER — VANCOMYCIN HCL 1750 MG/350ML IV SOLN
1750.0000 mg | Freq: Two times a day (BID) | INTRAVENOUS | Status: DC
  Administered 2024-01-28 – 2024-01-31 (×7): 1750 mg via INTRAVENOUS
  Filled 2024-01-28 (×10): qty 350

## 2024-01-28 NOTE — Progress Notes (Signed)
 [facility] PROGRESS NOTE  Kue Fox (DOB: 03-01-1966) ZOX:[id number] PCP: System, Provider Not In  Brief Narrative: Jim Dunn is a 58 y.o. male with a history of COPD, HFimpEF, PVD, HTN, HLD, seizure disorder, obesity (BMI 40), tobacco use who presented to the ED on 01/19/2024 who presented with cough, pleuritic chest pain and findings of pneumonia on CXR. Admitted for CAP, treated with antibiotics. Blood culture (1 of 4) grew MRSA for which ID was consulted. TTE showed no vegetation. Vancomycin  was initiated and surveillance blood cultures drawn 5/21. TEE is pending on 5/23 to inform duration of antibiotics. Since at least 2 weeks of vancomycin  will be required, plan is to pursue short term placement as his group home cannot administer IV antibiotics.   Subjective: Feels well, still walking around without issues, eating/drinking no issues. No chest pain, cough, dyspnea, or other complaints.   Objective: BP 119/68   Pulse 99   Temp 98.6 F (37 C) (Oral)   Resp 16   Ht 5\' 8"  (1.727 m)   Wt 121.3 kg   SpO2 98%   BMI 40.66 kg/m   Gen: No distress Pulm: Clear, nonlabored  CV: RRR, no MRG or pitting edema GI: Soft, NT, ND, +BS  Neuro: Alert and oriented. No new focal deficits. Ext: Warm, no deformities. Skin: No new rashes, lesions or ulcers on visualized skin   Assessment & Plan: Principal Problem:   CAP (community acquired pneumonia) Active Problems:   Seizure disorder (HCC)   Chronic systolic CHF (congestive heart failure) (HCC)   Mixed hyperlipidemia   COPD (chronic obstructive pulmonary disease) (HCC)   Microcytic anemia   Hyponatremia   Essential hypertension   Obesity, Class III, BMI 40-49.9 (morbid obesity)  MRSA bacteremia: Aerobic bottle (1 of 4 total) on 5/18.  - Continue vancomycin  with minimum duration 2 weeks (would be 5/19-6/1) unable to be administered at group home, so we're pursuing short term placement. If TEE negative, will insert midline and place  OPAT orders in preparation for DC. - Surveillance blood cultures redrawn 5/21 (remain NGTD at 4 days). - TTE without vegetation. TEE postponed due to hyponatremia last week but this is improving. Will make NPO p MN in hopes that this can be performed 5/27.  - Appreciate ID recommendations going forward.     Community-acquired pneumonia:  - Completed 5 days CTX/azithro - IS, mucolytic.    COPD: Quiescent.  - prn nebs.   Hyponatremia: Has been waxing/waning, appears asymptomatic, chronic problem. Nadir 124, stable on recheck. Labs consistent with SIADH.  - Improving with fluid restriction and salt tabs which we will continue. AM labs delayed, will readdress this PM, may be able to decrease salt tab. Pt remains euvolemic.    Chronic HFimpEF: BNP 29, appears euvolemic, echo shows EF 60%,  - Continue coreg , digoxin , entresto .   HTN:  - Continue norvasc , coreg , entresto .   HLD:  - Continue statin   Anxiety: Continue buspirone , prn alprazolam .   Seizure disorder: Continue home meds   Obesity class III: BMI 40.66 Advised diet, exercise and weight reduction.   Tobacco abuse:  - Cessation counseling provided - Nicotine  patch  [name], MD [facility] [email or web address] 01/28/2024, 7:28 AM

## 2024-01-28 NOTE — Plan of Care (Signed)
  Problem: Health Behavior/Discharge Planning: Goal: Ability to manage health-related needs will improve Outcome: Progressing   Problem: Clinical Measurements: Goal: Ability to maintain clinical measurements within normal limits will improve Outcome: Progressing Goal: Will remain free from infection Outcome: Progressing Goal: Diagnostic test results will improve Outcome: Progressing   Problem: Activity: Goal: Risk for activity intolerance will decrease Outcome: Progressing   Problem: Coping: Goal: Level of anxiety will decrease Outcome: Progressing   Problem: Elimination: Goal: Will not experience complications related to bowel motility Outcome: Progressing Goal: Will not experience complications related to urinary retention Outcome: Progressing   Problem: Pain Managment: Goal: General experience of comfort will improve and/or be controlled Outcome: Progressing   Problem: Safety: Goal: Ability to remain free from injury will improve Outcome: Progressing   Problem: Skin Integrity: Goal: Risk for impaired skin integrity will decrease Outcome: Progressing   Problem: Activity: Goal: Ability to tolerate increased activity will improve Outcome: Progressing

## 2024-01-28 NOTE — Plan of Care (Signed)
   Problem: Health Behavior/Discharge Planning: Goal: Ability to manage health-related needs will improve Outcome: Progressing   Problem: Clinical Measurements: Goal: Ability to maintain clinical measurements within normal limits will improve Outcome: Progressing Goal: Will remain free from infection Outcome: Progressing Goal: Diagnostic test results will improve Outcome: Progressing

## 2024-01-28 NOTE — Progress Notes (Signed)
 Pharmacy Antibiotic Note  Jim Dunn is a 58 y.o. male admitted on 01/19/2024 with MRSA bacteremia.  Pharmacy has been consulted for vancomycin  dosing. VP 20, and VT 35mcg/ml. AUC is below goal, adjust dose. Repeat BCX remain ngtd. Patient for TEE 5/27  Plan: -Increase Vancomycin  to 1750 mg q12h (eAUC 451)  - Will continue to monitor renal function, recheck vancomycin  levels if indicated  Height: 5\' 8"  (172.7 cm) Weight: 121.3 kg (267 lb 6.7 oz) IBW/kg (Calculated) : 68.4  Temp (24hrs), Avg:98.3 F (36.8 C), Min:98.1 F (36.7 C), Max:98.6 F (37 C)  Recent Labs  Lab 01/23/24 0545 01/23/24 1505 01/24/24 0403 01/25/24 0405 01/25/24 1306 01/25/24 1428 01/26/24 1255 01/27/24 0232 01/28/24 0616 01/28/24 1047  WBC 7.0  --  6.2  --   --   --   --   --   --   --   CREATININE 0.77  --  0.62   < > 0.62 0.66 0.66 0.58* 0.61  --   VANCOTROUGH  --  9*  --   --   --   --   --   --   --  12*  VANCOPEAK  --   --   --   --   --   --   --   --  20*  --    < > = values in this interval not displayed.    Estimated Creatinine Clearance: 132.2 mL/min (by C-G formula based on SCr of 0.61 mg/dL).    No Known Allergies  Antimicrobials this admission: Vancomycin  5/19 >>  Ceftriaxone  5/17 >> 5/21 Azithromycin  5/17 >>5/21  Microbiology results: 5/18 Bcx: MRSA  5/18 MRSA PCR + 5/21 BCX : ngtd Respiratory panel: neg  Thank you for allowing pharmacy to be a part of this patient's care.  Audra Blend PharmD., BCPS Clinical Pharmacist 01/28/2024 11:38 AM

## 2024-01-29 DIAGNOSIS — J189 Pneumonia, unspecified organism: Secondary | ICD-10-CM | POA: Diagnosis not present

## 2024-01-29 DIAGNOSIS — R7881 Bacteremia: Secondary | ICD-10-CM

## 2024-01-29 LAB — BASIC METABOLIC PANEL WITH GFR
Anion gap: 7 (ref 5–15)
BUN: 6 mg/dL (ref 6–20)
CO2: 26 mmol/L (ref 22–32)
Calcium: 8.5 mg/dL — ABNORMAL LOW (ref 8.9–10.3)
Chloride: 96 mmol/L — ABNORMAL LOW (ref 98–111)
Creatinine, Ser: 0.53 mg/dL — ABNORMAL LOW (ref 0.61–1.24)
GFR, Estimated: >60 mL/min (ref >60)
Glucose, Bld: 104 mg/dL — ABNORMAL HIGH (ref 70–99)
Potassium: 3.5 mmol/L (ref 3.5–5.1)
Sodium: 129 mmol/L — ABNORMAL LOW (ref 135–145)

## 2024-01-29 MED ORDER — ORAL CARE MOUTH RINSE: 15.0000 mL | OROMUCOSAL | Status: DC | PRN

## 2024-01-29 NOTE — Progress Notes (Signed)
 [facility] PROGRESS NOTE  Jim Dunn (DOB: 1966-04-12) [id number] PCP: System, Provider Not In  Brief Narrative: Jim Dunn is a 58 y.o. male with a history of COPD, HFimpEF, PVD, HTN, HLD, seizure disorder, obesity (BMI 40), tobacco use who presented to the ED on 01/19/2024 who presented with cough, pleuritic chest pain and findings of pneumonia on CXR. Admitted for CAP, treated with antibiotics. Blood culture (1 of 4) grew MRSA for which ID was consulted. TTE showed no vegetation. Vancomycin  was initiated and surveillance blood cultures drawn 5/21. TEE is pending on 5/28 to inform duration of antibiotics. Since at least 2 weeks of vancomycin  will be required, plan is to pursue short term placement as his group home cannot administer IV antibiotics.   Subjective: No complaints, he's hungry this morning.   Objective: BP (!) 148/93 (BP Location: Right Arm)   Pulse 76   Temp 98.1 F (36.7 C) (Oral)   Resp 18   Ht 5\' 8"  (1.727 m)   Wt 121.3 kg   SpO2 95%   BMI 40.66 kg/m   Gen: No distress Pulm: Nonlabored, clear  CV: RRR, no pitting edema GI: Soft, NT, ND, +BS Neuro: Alert and oriented. No new focal deficits. Ext: Warm, no deformities. Skin: No rashes, lesions or ulcers on visualized skin   Assessment & Plan: Principal Problem:   CAP (community acquired pneumonia) Active Problems:   Seizure disorder (HCC)   Chronic systolic CHF (congestive heart failure) (HCC)   Mixed hyperlipidemia   COPD (chronic obstructive pulmonary disease) (HCC)   Microcytic anemia   Hyponatremia   Essential hypertension   Obesity, Class III, BMI 40-49.9 (morbid obesity)  MRSA bacteremia: Aerobic bottle (1 of 4 total) on 5/18.  - Continue vancomycin  with minimum duration 2 weeks (would be 5/19-6/1) unable to be administered at group home, so we're pursuing short term placement. If TEE negative, will insert midline and place OPAT orders in preparation for DC. Plan would change if TEE showed  vegetation. - Surveillance blood cultures redrawn 5/21 have remained negative. - TTE without vegetation. TEE postponed due to hyponatremia last week but this is improving. D/w cardiology who will put him on for tomorrow, NPO p MN.  - Appreciate ID recommendations going forward.     Community-acquired pneumonia:  - Completed 5 days CTX/azithro - IS, mucolytic.    COPD: Quiescent.  - prn nebs.   Hyponatremia: Has been waxing/waning, appears asymptomatic, chronic problem. Nadir 124, stable on recheck. Labs consistent with SIADH.  - Improving with fluid restriction and salt tabs which we will continue. Na 129 this AM. If anesthesia requires higher sodium level, we can consider nephrology consult.   Chronic HFimpEF: BNP 29, appears euvolemic, echo shows EF 60%,  - Continue coreg , digoxin , entresto .   HTN:  - Continue norvasc , coreg , entresto .   HLD:  - Continue statin   Anxiety: - Continue buspirone , prn alprazolam .   Seizure disorder: - Continue home meds   Obesity class III: Body mass index is 40.66 kg/m.    Tobacco abuse:  - Cessation counseling provided - Nicotine  patch  [name], MD [facility] [email or web address] 01/29/2024, 9:55 AM

## 2024-01-29 NOTE — Plan of Care (Signed)

## 2024-01-30 DIAGNOSIS — E66813 Obesity, class 3: Secondary | ICD-10-CM | POA: Diagnosis not present

## 2024-01-30 DIAGNOSIS — J189 Pneumonia, unspecified organism: Secondary | ICD-10-CM | POA: Diagnosis not present

## 2024-01-30 DIAGNOSIS — I5022 Chronic systolic (congestive) heart failure: Secondary | ICD-10-CM | POA: Diagnosis not present

## 2024-01-30 DIAGNOSIS — I10 Essential (primary) hypertension: Secondary | ICD-10-CM | POA: Diagnosis not present

## 2024-01-30 LAB — CREATININE, SERUM
Creatinine, Ser: 0.57 mg/dL — ABNORMAL LOW (ref 0.61–1.24)
GFR, Estimated: >60 mL/min (ref >60)

## 2024-01-30 LAB — CULTURE, BLOOD (ROUTINE X 2): Special Requests: ADEQUATE

## 2024-01-30 LAB — MIC RESULT

## 2024-01-30 LAB — MINIMUM INHIBITORY CONC. (1 DRUG)

## 2024-01-30 LAB — SODIUM: Sodium: 130 mmol/L — ABNORMAL LOW (ref 135–145)

## 2024-01-30 NOTE — Progress Notes (Signed)
    TEE is scheduled for 01/31/2024 at 10 am with Dr. Avanell Bob. Patient was notified and is still in agreement with procedure. Patient was notified to remain NPO after midnight. Patient is aware and understands.    Signed, Metta Actis, PA-C 01/30/2024, 12:11 PM

## 2024-01-30 NOTE — Plan of Care (Signed)
   Problem: Health Behavior/Discharge Planning: Goal: Ability to manage health-related needs will improve Outcome: Progressing   Problem: Clinical Measurements: Goal: Ability to maintain clinical measurements within normal limits will improve Outcome: Progressing Goal: Will remain free from infection Outcome: Progressing Goal: Diagnostic test results will improve Outcome: Progressing

## 2024-01-30 NOTE — Plan of Care (Signed)

## 2024-01-30 NOTE — Progress Notes (Signed)
 [facility] PROGRESS NOTE  Jim Dunn (DOB: 1965-10-02) [id number] PCP: System, Provider Not In  Brief Narrative: Jim Dunn is a 58 y.o. male with a history of COPD, HFimpEF, PVD, HTN, HLD, seizure disorder, obesity (BMI 40), tobacco use who presented to the ED on 01/19/2024 who presented with cough, pleuritic chest pain and findings of pneumonia on CXR. Admitted for CAP, treated with antibiotics. Blood culture (1 of 4) grew MRSA for which ID was consulted. TTE showed no vegetation. Vancomycin  was initiated and surveillance blood cultures drawn 5/21. TEE is pending on 5/28 to inform duration of antibiotics. Since at least 2 weeks of vancomycin  will be required, plan is to pursue short term placement as his group home cannot administer IV antibiotics.   Subjective: No complaints, he's hungry this morning.   ***   Objective: BP 130/60 (BP Location: Right Arm)   Pulse 73   Temp (!) 97.5 F (36.4 C) (Oral)   Resp 19   Ht 5\' 8"  (1.727 m)   Wt 121.3 kg   SpO2 98%   BMI 40.66 kg/m   Gen: No distress Pulm: Nonlabored, clear  CV: RRR, no pitting edema GI: Soft, NT, ND, +BS Neuro: Alert and oriented. No new focal deficits. Ext: Warm, no deformities. Skin: No rashes, lesions or ulcers on visualized skin   Assessment & Plan: Principal Problem:   CAP (community acquired pneumonia) Active Problems:   Seizure disorder (HCC)   Chronic systolic CHF (congestive heart failure) (HCC)   Mixed hyperlipidemia   COPD (chronic obstructive pulmonary disease) (HCC)   Microcytic anemia   Hyponatremia   Essential hypertension   Obesity, Class III, BMI 40-49.9 (morbid obesity)   Bacteremia  MRSA bacteremia: Aerobic bottle (1 of 4 total) on 5/18.  - Continue vancomycin  with minimum duration 2 weeks (would be 5/19-6/1) unable to be administered at group home, so we're pursuing short term placement. If TEE negative, will insert midline and place OPAT orders in preparation for DC. Plan would  change if TEE showed vegetation. - Surveillance blood cultures redrawn 5/21 have remained negative. - TTE without vegetation. TEE postponed due to hyponatremia last week but this is improving. D/w cardiology who will put him on for tomorrow, NPO p MN.  - Appreciate ID recommendations going forward.     Community-acquired pneumonia:  - Completed 5 days CTX/azithro - IS, mucolytic.    COPD: Quiescent.  - prn nebs.   Hyponatremia: Has been waxing/waning, appears asymptomatic, chronic problem. Nadir 124, stable on recheck. Labs consistent with SIADH.  - Improving with fluid restriction and salt tabs which we will continue. Na 129 this AM. If anesthesia requires higher sodium level, we can consider nephrology consult.   Chronic HFimpEF: BNP 29, appears euvolemic, echo shows EF 60%,  - Continue coreg , digoxin , entresto .   HTN:  - Continue norvasc , coreg , entresto .   HLD:  - Continue statin   Anxiety: - Continue buspirone , prn alprazolam .   Seizure disorder: - Continue home meds   Obesity class III: Body mass index is 40.66 kg/m.    Tobacco abuse:  - Cessation counseling provided - Nicotine  patch  Colin Dawley, MD [facility] [email or web address] 01/30/2024, 7:08 PM

## 2024-01-30 NOTE — Progress Notes (Signed)
 Patient tearful at times during this shift due to loss of a child 10 years ago , emotional support and positive reinforcement utilized to redirect patients thoughts on things we can control at this time. Patient given PRN xanax  per his request and wanted to lay down and sleep.

## 2024-01-31 ENCOUNTER — Other Ambulatory Visit (HOSPITAL_COMMUNITY): Payer: Self-pay | Admitting: *Deleted

## 2024-01-31 ENCOUNTER — Inpatient Hospital Stay (HOSPITAL_COMMUNITY)

## 2024-01-31 ENCOUNTER — Encounter (HOSPITAL_COMMUNITY): Payer: Self-pay | Admitting: Internal Medicine

## 2024-01-31 ENCOUNTER — Inpatient Hospital Stay (HOSPITAL_COMMUNITY): Admitting: Certified Registered"

## 2024-01-31 ENCOUNTER — Encounter (HOSPITAL_COMMUNITY)
Admission: EM | Disposition: A | Discharge status: Skilled Nursing Facility | Payer: Self-pay | Source: Home / Self Care | Attending: Family Medicine

## 2024-01-31 DIAGNOSIS — R7881 Bacteremia: Secondary | ICD-10-CM

## 2024-01-31 DIAGNOSIS — F17210 Nicotine dependence, cigarettes, uncomplicated: Secondary | ICD-10-CM

## 2024-01-31 DIAGNOSIS — I110 Hypertensive heart disease with heart failure: Secondary | ICD-10-CM | POA: Diagnosis not present

## 2024-01-31 DIAGNOSIS — I5023 Acute on chronic systolic (congestive) heart failure: Secondary | ICD-10-CM | POA: Diagnosis not present

## 2024-01-31 DIAGNOSIS — J449 Chronic obstructive pulmonary disease, unspecified: Secondary | ICD-10-CM | POA: Diagnosis not present

## 2024-01-31 DIAGNOSIS — B9562 Methicillin resistant Staphylococcus aureus infection as the cause of diseases classified elsewhere: Secondary | ICD-10-CM | POA: Diagnosis not present

## 2024-01-31 DIAGNOSIS — I358 Other nonrheumatic aortic valve disorders: Secondary | ICD-10-CM

## 2024-01-31 HISTORY — PX: TEE WITHOUT CARDIOVERSION: SHX5443

## 2024-01-31 LAB — ECHO TEE

## 2024-01-31 LAB — CBC
HCT: 31.5 % — ABNORMAL LOW (ref 39.0–52.0)
Hemoglobin: 10.3 g/dL — ABNORMAL LOW (ref 13.0–17.0)
MCH: 22.6 pg — ABNORMAL LOW (ref 26.0–34.0)
MCHC: 32.7 g/dL (ref 30.0–36.0)
MCV: 69.1 fL — ABNORMAL LOW (ref 80.0–100.0)
Platelets: 223 10*3/uL (ref 150–400)
RBC: 4.56 MIL/uL (ref 4.22–5.81)
RDW: 16.7 % — ABNORMAL HIGH (ref 11.5–15.5)
WBC: 7.7 10*3/uL (ref 4.0–10.5)
nRBC: 0 % (ref 0.0–0.2)

## 2024-01-31 LAB — BASIC METABOLIC PANEL WITH GFR
Anion gap: 10 (ref 5–15)
BUN: 6 mg/dL (ref 6–20)
CO2: 26 mmol/L (ref 22–32)
Calcium: 8.4 mg/dL — ABNORMAL LOW (ref 8.9–10.3)
Chloride: 93 mmol/L — ABNORMAL LOW (ref 98–111)
Creatinine, Ser: 0.46 mg/dL — ABNORMAL LOW (ref 0.61–1.24)
GFR, Estimated: >60 mL/min (ref >60)
Glucose, Bld: 77 mg/dL (ref 70–99)
Potassium: 3.9 mmol/L (ref 3.5–5.1)
Sodium: 129 mmol/L — ABNORMAL LOW (ref 135–145)

## 2024-01-31 LAB — PHOSPHORUS: Phosphorus: 4.4 mg/dL (ref 2.5–4.6)

## 2024-01-31 LAB — ALBUMIN: Albumin: 3.2 g/dL — ABNORMAL LOW (ref 3.5–5.0)

## 2024-01-31 SURGERY — ECHOCARDIOGRAM, TRANSESOPHAGEAL
Anesthesia: General

## 2024-01-31 MED ORDER — BUTAMBEN-TETRACAINE-BENZOCAINE 2-2-14 % EX AERO
INHALATION_SPRAY | CUTANEOUS | Status: AC
  Filled 2024-01-31: qty 5

## 2024-01-31 MED ORDER — PROPOFOL 500 MG/50ML IV EMUL
INTRAVENOUS | Status: DC | PRN
  Administered 2024-01-31: 150 ug/kg/min via INTRAVENOUS

## 2024-01-31 MED ORDER — LACTATED RINGERS IV SOLN: INTRAVENOUS | Status: DC | PRN

## 2024-01-31 MED ORDER — DAPTOMYCIN-SODIUM CHLORIDE 700-0.9 MG/100ML-% IV SOLN
8.0000 mg/kg | Freq: Every day | INTRAVENOUS | Status: DC
  Administered 2024-01-31 – 2024-02-01 (×2): 700 mg via INTRAVENOUS
  Filled 2024-01-31 (×3): qty 100

## 2024-01-31 MED ORDER — PHENYLEPHRINE 80 MCG/ML (10ML) SYRINGE FOR IV PUSH (FOR BLOOD PRESSURE SUPPORT)
PREFILLED_SYRINGE | INTRAVENOUS | Status: DC | PRN
  Administered 2024-01-31 (×3): 80 ug via INTRAVENOUS

## 2024-01-31 MED ORDER — PROPOFOL 10 MG/ML IV BOLUS
INTRAVENOUS | Status: DC | PRN
  Administered 2024-01-31: 40 mg via INTRAVENOUS

## 2024-01-31 MED ORDER — PROPOFOL 10 MG/ML IV BOLUS: INTRAVENOUS | Status: DC | PRN

## 2024-01-31 NOTE — Progress Notes (Signed)
 [facility] PROGRESS NOTE  Jim Dunn (DOB: June 06, 1966) [id number] PCP: System, Provider Not In  Brief Narrative: Jim Dunn is a 58 y.o. male with a history of COPD, HFimpEF, PVD, HTN, HLD, seizure disorder, obesity (BMI 40), tobacco use who presented to the ED on 01/19/2024 who presented with cough, pleuritic chest pain and findings of pneumonia on CXR. Admitted for CAP, treated with antibiotics. Blood culture (1 of 4) grew MRSA for which ID was consulted. TTE showed no vegetation. Vancomycin  was initiated on 01/21/24 and surveillance blood cultures drawn 5/21. TEE is pending on 5/28 to inform duration of antibiotics. Since at least 2 weeks of iv Abx  will be required, plan is to pursue short term placement as his group home cannot administer IV antibiotics.   Subjective:  - Tolerated TTE well - Eager to eat postprocedure No fever  Or chills  - No cough or dyspnea  Objective: BP (!) 177/87   Pulse (!) 59   Temp 97.7 F (36.5 C) (Oral)   Resp 18   Ht 5\' 8"  (1.727 m)   Wt 127 kg   SpO2 99%   BMI 42.57 kg/m    Physical Exam Gen:- Awake Alert, in no acute distress  HEENT:- [place].AT, No sclera icterus Neck-Supple Neck,No JVD,.  Lungs-  CTAB , fair air movement bilaterally  CV- S1, S2 normal, RRR Abd-  +ve B.Sounds, Abd Soft, No tenderness, increased truncal adiposity    Extremity/Skin:- No  edema,   good pedal pulses  Psych-affect is appropriate, oriented x3 Neuro-no new focal deficits, no tremors  Assessment & Plan: Principal Problem:   MRSA bacteremia Active Problems:   Seizure disorder (HCC)   Hyponatremia   Obesity, Class III, BMI 40-49.9 (morbid obesity)   Chronic systolic CHF (congestive heart failure) (HCC)   Mixed hyperlipidemia   COPD (chronic obstructive pulmonary disease) (HCC)   Microcytic anemia   SIADH (syndrome of inappropriate ADH production) (HCC)   Essential hypertension   CAP (community acquired pneumonia)   Bacteremia  1)MRSA bacteremia:  Aerobic bottle (1 of 4 total) on 01/20/24.  - Started on iv  vancomycin  on  01/21/24 -- Surveillance blood cultures redrawn 01/23/24 Negative -Remains afebrile, -No leukocytosis - TTE without vegetation.  -TEE on 01/31/2024 without vegetations--discussed with ID physician Dr. Liane Redman recommends IV daptomycin through 02/05/2024 -Will obtain PICC line - Switch to IV Daptomycin on 01/31/2024  2)Community-acquired pneumonia:  - Completed 5 days CTX/azithro - IS, mucolytic.    3)COPD: Quiescent.  - prn nebs.   4)Hyponatremia: Has been waxing/waning, appears asymptomatic, chronic problem. Nadir 124, stable on recheck. Labs consistent with SIADH.  - Improving with fluid restriction and salt tabs which we will continue.  5)Chronic HFimpEF: BNP 29, appears euvolemic, echo shows EF 60%,  - Continue coreg , digoxin , entresto . --Monitor closely with salt tablet use   6)HTN:  - Continue norvasc , coreg , entresto .   HLD:  - Continue atorvastatin    Anxiety/depression/mood disorder - Continue buspirone , Prozac  ,Remeron  and prn alprazolam .   Seizure disorder: - Continue Depakote ,   Morbid Obesity- -Low calorie diet, portion control and increase physical activity discussed with patient -Body mass index is 42.57 kg/m.   Tobacco abuse:  - -Smoking Cessation advised - Nicotine  patch  Colin Dawley, MD [facility] [email or web address] 01/31/2024, 5:05 PM

## 2024-01-31 NOTE — Anesthesia Preprocedure Evaluation (Signed)
 Anesthesia Evaluation  Patient identified by MRN, date of birth, ID band Patient awake    Reviewed: Allergy & Precautions, H&P , NPO status , Patient's Chart, lab work & pertinent test results, reviewed documented beta blocker date and time   Airway Mallampati: II  TM Distance: >3 FB Neck ROM: full    Dental no notable dental hx.    Pulmonary pneumonia, COPD, Current Smoker and Patient abstained from smoking.   Pulmonary exam normal breath sounds clear to auscultation       Cardiovascular Exercise Tolerance: Good hypertension, +CHF   Rhythm:regular Rate:Normal     Neuro/Psych Seizures -,  PSYCHIATRIC DISORDERS Anxiety   Schizophrenia Dementia  Neuromuscular disease    GI/Hepatic negative GI ROS, Neg liver ROS,,,  Endo/Other    Class 4 obesity  Renal/GU negative Renal ROS  negative genitourinary   Musculoskeletal   Abdominal   Peds  Hematology  (+) Blood dyscrasia, anemia   Anesthesia Other Findings   Reproductive/Obstetrics negative OB ROS                             Anesthesia Physical Anesthesia Plan  ASA: 4 and emergent  Anesthesia Plan: General   Post-op Pain Management:    Induction:   PONV Risk Score and Plan: Propofol  infusion  Airway Management Planned:   Additional Equipment:   Intra-op Plan:   Post-operative Plan:   Informed Consent: I have reviewed the patients History and Physical, chart, labs and discussed the procedure including the risks, benefits and alternatives for the proposed anesthesia with the patient or authorized representative who has indicated his/her understanding and acceptance.     Dental Advisory Given  Plan Discussed with: CRNA  Anesthesia Plan Comments:        Anesthesia Quick Evaluation

## 2024-01-31 NOTE — Transfer of Care (Signed)
 Immediate Anesthesia Transfer of Care Note  Patient: Jim Dunn  Procedure(s) Performed: ECHOCARDIOGRAM, TRANSESOPHAGEAL  Patient Location: PACU  Anesthesia Type:General  Level of Consciousness: awake, alert , and oriented  Airway & Oxygen Therapy: Patient Spontanous Breathing and Patient connected to nasal cannula oxygen  Post-op Assessment: Report given to RN and Post -op Vital signs reviewed and stable  Post vital signs: Reviewed and stable  Last Vitals:  Vitals Value Taken Time  BP    Temp    Pulse    Resp    SpO2      Last Pain:  Vitals:   01/31/24 0917  TempSrc: Oral  PainSc: 0-No pain         Complications: No notable events documented.

## 2024-01-31 NOTE — Plan of Care (Signed)
  Problem: Health Behavior/Discharge Planning: Goal: Ability to manage health-related needs will improve Outcome: Progressing   Problem: Clinical Measurements: Goal: Ability to maintain clinical measurements within normal limits will improve Outcome: Progressing Goal: Will remain free from infection Outcome: Progressing Goal: Diagnostic test results will improve Outcome: Progressing   Problem: Coping: Goal: Level of anxiety will decrease Outcome: Progressing   Problem: Elimination: Goal: Will not experience complications related to urinary retention Outcome: Progressing   Problem: Pain Managment: Goal: General experience of comfort will improve and/or be controlled Outcome: Progressing

## 2024-01-31 NOTE — Progress Notes (Signed)
   TEE   Indication:   R/o endocarditis   Pt sedated by anesthesia with propofol  intravenously Bite guard placed in mouth TEE probe advanced to mid esophagus without difficulty    TV normal  Trace TR PV normal   No PI AV mildly thickened.  No AI MV nromal   Trace MR  LVEF and RVEF normal  LA, LAA, RA without masses  No PFO as  tested by injection of agitated saline or with color doppler  Thoracic aorta normal  Procedure without complication  Full report to follow in CV section of chart  Ola Berger MD

## 2024-01-31 NOTE — Progress Notes (Signed)
 PHARMACY CONSULT NOTE FOR:  OUTPATIENT  PARENTERAL ANTIBIOTIC THERAPY (OPAT)  Indication: MRSA bacteremia Regimen: Daptomycin 700 mg IV every 24 hours End date: 02/05/24  IV antibiotic discharge orders are pended. To discharging provider:  please sign these orders via discharge navigator,  Select New Orders & click on the button choice - Manage This Unsigned Work.     Thank you for allowing pharmacy to be a part of this patient's care.  Garland Junk, PharmD, BCPS, BCIDP Infectious Diseases Clinical Pharmacist 01/31/2024 3:47 PM   **Pharmacist phone directory can now be found on [email or web address] ([email or web address]).  Listed under [facility].

## 2024-01-31 NOTE — Progress Notes (Signed)
 Mobility Specialist Progress Note:    01/31/24 1529  Mobility  Activity Ambulated with assistance in hallway  Level of Assistance Independent  Assistive Device None  Distance Ambulated (ft) 250 ft  Range of Motion/Exercises Active;All extremities  Activity Response Tolerated well  Mobility Referral Yes  Mobility visit 1 Mobility  Mobility Specialist Start Time (ACUTE ONLY) 1504  Mobility Specialist Stop Time (ACUTE ONLY) 1528  Mobility Specialist Time Calculation (min) (ACUTE ONLY) 24 min   Pt received ambulating at door, agreeable to further mobility. Independently able to stand and ambulate with no AD. Tolerated well, asx throughout. Returned to room, left pt supine with RN. All needs met.   Glinda Lapping Mobility Specialist Please contact via Special educational needs teacher or  Rehab office at [phone]

## 2024-01-31 NOTE — Progress Notes (Signed)
 Pharmacy Antibiotic Note  Jim Dunn is a 58 y.o. male admitted on 01/19/2024 with MRSA bacteremia.  Pharmacy has been consulted for vancomycin  dosing.  Levels done on 5/26: VP 20, and VT 32mcg/ml. Dose was adjusted.   TEE planned for today.   Plan: Continue Vancomycin  to 1750 mg q12h (eAUC 451)  Will continue to monitor renal function, recheck vancomycin  levels if indicated  Height: 5\' 8"  (172.7 cm) Weight: 127 kg (280 lb) IBW/kg (Calculated) : 68.4  Temp (24hrs), Avg:98 F (36.7 C), Min:97.5 F (36.4 C), Max:98.5 F (36.9 C)  Recent Labs  Lab 01/27/24 0232 01/28/24 0616 01/28/24 1047 01/29/24 0444 01/30/24 0406 01/31/24 0410  WBC  --   --   --   --   --  7.7  CREATININE 0.58* 0.61  --  0.53* 0.57* 0.46*  VANCOTROUGH  --   --  12*  --   --   --   VANCOPEAK  --  20*  --   --   --   --     Estimated Creatinine Clearance: 135.5 mL/min (A) (by C-G formula based on SCr of 0.46 mg/dL (L)).    No Known Allergies  Antimicrobials this admission: Vancomycin  5/19 >>  Ceftriaxone  5/17 >> 5/21 Azithromycin  5/17 >>5/21  Microbiology results: 5/18 Bcx: MRSA  5/18 MRSA PCR + 5/21 BCX : ngtd Respiratory panel: neg  Thank you for allowing pharmacy to be a part of this patient's care.  Audra Blend PharmD., BCPS Clinical Pharmacist 01/31/2024 10:57 AM

## 2024-01-31 NOTE — Progress Notes (Signed)
*  PRELIMINARY RESULTS* Echocardiogram 2D Echocardiogram has been performed with saline bubble study.  Bernis Brisker 01/31/2024, 11:06 AM

## 2024-02-01 ENCOUNTER — Other Ambulatory Visit: Payer: Self-pay

## 2024-02-01 ENCOUNTER — Encounter (HOSPITAL_COMMUNITY): Payer: Self-pay | Admitting: Internal Medicine

## 2024-02-01 DIAGNOSIS — R7881 Bacteremia: Secondary | ICD-10-CM | POA: Diagnosis not present

## 2024-02-01 DIAGNOSIS — B9562 Methicillin resistant Staphylococcus aureus infection as the cause of diseases classified elsewhere: Secondary | ICD-10-CM | POA: Diagnosis not present

## 2024-02-01 LAB — CREATININE, SERUM
Creatinine, Ser: 0.48 mg/dL — ABNORMAL LOW (ref 0.61–1.24)
GFR, Estimated: >60 mL/min (ref >60)

## 2024-02-01 LAB — CK: Total CK: 104 U/L (ref 49–397)

## 2024-02-01 MED ORDER — POLYETHYLENE GLYCOL 3350 17 GM/SCOOP PO POWD: 17.0000 g | Freq: Every day | ORAL | 1 refills | Status: AC

## 2024-02-01 MED ORDER — METOPROLOL SUCCINATE ER 50 MG PO TB24: 50.0000 mg | ORAL_TABLET | Freq: Every day | ORAL | 3 refills | Status: AC

## 2024-02-01 MED ORDER — PREGABALIN 150 MG PO CAPS: 150.0000 mg | ORAL_CAPSULE | Freq: Two times a day (BID) | ORAL | 0 refills | Status: AC

## 2024-02-01 MED ORDER — CARVEDILOL 25 MG PO TABS: 25.0000 mg | ORAL_TABLET | Freq: Two times a day (BID) | ORAL | 5 refills | Status: AC

## 2024-02-01 MED ORDER — QUETIAPINE FUMARATE 50 MG PO TABS: 50.0000 mg | ORAL_TABLET | Freq: Every day | ORAL | 5 refills | Status: AC

## 2024-02-01 MED ORDER — DIVALPROEX SODIUM 500 MG PO DR TAB: 500.0000 mg | DELAYED_RELEASE_TABLET | Freq: Two times a day (BID) | ORAL | 5 refills | Status: AC

## 2024-02-01 MED ORDER — NICOTINE 21 MG/24HR TD PT24: 21.0000 mg | MEDICATED_PATCH | Freq: Every day | TRANSDERMAL | 0 refills | Status: AC

## 2024-02-01 MED ORDER — ACETAMINOPHEN 325 MG PO TABS: 650.0000 mg | ORAL_TABLET | Freq: Four times a day (QID) | ORAL | Status: AC | PRN

## 2024-02-01 MED ORDER — TRAZODONE HCL 50 MG PO TABS: 50.0000 mg | ORAL_TABLET | Freq: Every day | ORAL | 5 refills | Status: AC

## 2024-02-01 MED ORDER — AMLODIPINE BESYLATE 10 MG PO TABS: 10.0000 mg | ORAL_TABLET | Freq: Every day | ORAL | 5 refills | Status: AC

## 2024-02-01 MED ORDER — SODIUM CHLORIDE 0.9% FLUSH: 10.0000 mL | INTRAVENOUS | Status: DC | PRN

## 2024-02-01 MED ORDER — CHLORHEXIDINE GLUCONATE CLOTH 2 % EX PADS
6.0000 | MEDICATED_PAD | Freq: Every day | CUTANEOUS | Status: DC
Start: 2024-02-01 — End: 2024-02-01
  Administered 2024-02-01: 6 via TOPICAL

## 2024-02-01 MED ORDER — MIRTAZAPINE 15 MG PO TABS: 15.0000 mg | ORAL_TABLET | Freq: Every day | ORAL | 3 refills | Status: AC

## 2024-02-01 MED ORDER — SODIUM CHLORIDE 1 G PO TABS: 1.0000 g | ORAL_TABLET | Freq: Three times a day (TID) | ORAL | 0 refills | Status: AC

## 2024-02-01 MED ORDER — ENTRESTO 97-103 MG PO TABS: 1.0000 | ORAL_TABLET | Freq: Two times a day (BID) | ORAL | 5 refills | Status: AC

## 2024-02-01 MED ORDER — ALPRAZOLAM 0.5 MG PO TABS
0.5000 mg | ORAL_TABLET | Freq: Once | ORAL | Status: AC
  Administered 2024-02-01: 0.5 mg via ORAL
  Filled 2024-02-01: qty 1

## 2024-02-01 MED ORDER — ALBUTEROL SULFATE (2.5 MG/3ML) 0.083% IN NEBU: 2.5000 mg | INHALATION_SOLUTION | RESPIRATORY_TRACT | 12 refills | Status: AC | PRN

## 2024-02-01 MED ORDER — DAPTOMYCIN IV (FOR PTA / DISCHARGE USE ONLY): 700.0000 mg | INTRAVENOUS | 0 refills | Status: AC

## 2024-02-01 MED ORDER — BUSPIRONE HCL 15 MG PO TABS: 15.0000 mg | ORAL_TABLET | Freq: Three times a day (TID) | ORAL | 3 refills | Status: AC

## 2024-02-01 MED ORDER — SODIUM CHLORIDE 0.9% FLUSH
10.0000 mL | Freq: Two times a day (BID) | INTRAVENOUS | Status: DC
  Administered 2024-02-01: 10 mL

## 2024-02-01 MED ORDER — ALPRAZOLAM 1 MG PO TABS: 1.0000 mg | ORAL_TABLET | Freq: Two times a day (BID) | ORAL | 0 refills | Status: AC | PRN

## 2024-02-01 MED ORDER — DAPTOMYCIN-SODIUM CHLORIDE 700-0.9 MG/100ML-% IV SOLN: 700.0000 mg | Freq: Every day | INTRAVENOUS | Status: AC

## 2024-02-01 MED ORDER — SENNOSIDES-DOCUSATE SODIUM 8.6-50 MG PO TABS: 2.0000 | ORAL_TABLET | Freq: Two times a day (BID) | ORAL | 3 refills | Status: AC

## 2024-02-01 MED ORDER — OHTUVAYRE 3 MG/2.5ML IN SUSP: 2.5000 mL | Freq: Two times a day (BID) | RESPIRATORY_TRACT | 5 refills | Status: DC

## 2024-02-01 MED ORDER — GUAIFENESIN ER 600 MG PO TB12: 600.0000 mg | ORAL_TABLET | Freq: Two times a day (BID) | ORAL | 2 refills | Status: AC

## 2024-02-01 NOTE — Discharge Instructions (Signed)
 1)IV daptomycin 700 mg daily through 02/05/2024 inclusive ---Indication:  MRSA bacteremia First Dose: Yes Last Day of Therapy:  02/05/24 Labs - Once weekly:  CBC/D, BMP, and CPK Labs - Once weekly: ESR and CRP Method of administration: IV Push Method of administration may be changed at the discretion of home infusion pharmacist based upon assessment of the patient and/or caregiver's ability to self-administer the medication ordered.  2)Very Low-salt diet advised---Less than 2 gm of Sodium per day advised----ok to use Mrs DASH salt substitute instead of Salt

## 2024-02-01 NOTE — Progress Notes (Signed)
 Report called to [facility] LPN at [facility] [phone] room 9B. Legal Guardian called with updates also.

## 2024-02-01 NOTE — Progress Notes (Signed)
 Peripherally Inserted Central Catheter Placement  The IV Nurse has discussed with the patient and/or persons authorized to consent for the patient, the purpose of this procedure and the potential benefits and risks involved with this procedure.  The benefits include less needle sticks, lab draws from the catheter, and the patient may be discharged home with the catheter. Risks include, but not limited to, infection, bleeding, blood clot (thrombus formation), and puncture of an artery; nerve damage and irregular heartbeat and possibility to perform a PICC exchange if needed/ordered by physician.  Alternatives to this procedure were also discussed.  Bard Power PICC patient education guide, fact sheet on infection prevention and patient information card has been provided to patient /or left at bedside.  Consent obtained by legal guardian, Hayes Czaja with second witness.  PICC Placement Documentation  PICC Single Lumen 02/01/24 Right Brachial 44 cm 1 cm (Active)  Indication for Insertion or Continuance of Line Prolonged intravenous therapies 02/01/24 1215  Exposed Catheter (cm) 1 cm 02/01/24 1215  Site Assessment Clean, Dry, Intact 02/01/24 1215  Line Status Flushed;Saline locked;Blood return noted 02/01/24 1215  Dressing Type Transparent 02/01/24 1215  Dressing Status Antimicrobial disc/dressing in place;Clean, Dry, Intact 02/01/24 1215  Line Care Cap changed;Connections checked and tightened 02/01/24 1215  Dressing Change Due 02/08/24 02/01/24 1215       Avelina Bode 02/01/2024, 12:37 PM

## 2024-02-01 NOTE — Discharge Summary (Signed)
 Jim Dunn, is a 58 y.o. male  DOB 1966/02/15  MRN [id number].  Admission date:  01/19/2024  Admitting Physician  Twilla Galea, DO  Discharge Date:  02/01/2024   Primary MD  System, Provider Not In  Recommendations for primary care physician for things to follow:  1)IV daptomycin 700 mg daily through 02/05/2024 inclusive ---Indication:  MRSA bacteremia First Dose: Yes Last Day of Therapy:  02/05/24 Labs - Once weekly:  CBC/D, BMP, and CPK Labs - Once weekly: ESR and CRP Method of administration: IV Push Method of administration may be changed at the discretion of home infusion pharmacist based upon assessment of the patient and/or caregiver's ability to self-administer the medication ordered.  2)Very Low-salt diet advised---Less than 2 gm of Sodium per day advised----ok to use Mrs DASH salt substitute instead of Salt  Admission Diagnosis  Hyponatremia [E87.1] CAP (community acquired pneumonia) [J18.9] Pneumonia of both lungs due to infectious organism, unspecified part of lung [J18.9]   Discharge Diagnosis  Hyponatremia [E87.1] CAP (community acquired pneumonia) [J18.9] Pneumonia of both lungs due to infectious organism, unspecified part of lung [J18.9]    Principal Problem:   MRSA bacteremia Active Problems:   Seizure disorder (HCC)   Hyponatremia   Obesity, Class III, BMI 40-49.9 (morbid obesity)   Chronic systolic CHF (congestive heart failure) (HCC)   Mixed hyperlipidemia   COPD (chronic obstructive pulmonary disease) (HCC)   Microcytic anemia   SIADH (syndrome of inappropriate ADH production) (HCC)   Essential hypertension   CAP (community acquired pneumonia)   Bacteremia      Past Medical History:  Diagnosis Date   CHF (congestive heart failure) (HCC)    COPD (chronic obstructive pulmonary disease) (HCC)    History of kidney stones    Hypertension    Hyponatremia    Paranoid  schizophrenia (HCC)    Pneumonia    Seizures (HCC)    Tobacco abuse     Past Surgical History:  Procedure Laterality Date   KIDNEY STONE SURGERY     MULTIPLE EXTRACTIONS WITH ALVEOLOPLASTY Bilateral 04/29/2020   Procedure: Extraction of tooth #'s 2-13, 17,18, 21-25, and 27-31 with alveoloplasty and bilateral lingual exostoses reductions.;  Surgeon: Carol Chroman, DDS;  Location: [facility];  Service: Oral Surgery;  Laterality: Bilateral;   TEE WITHOUT CARDIOVERSION N/A 01/31/2024   Procedure: ECHOCARDIOGRAM, TRANSESOPHAGEAL;  Surgeon: Elmyra Haggard, MD;  Location: [facility];  Service: Cardiovascular;  Laterality: N/A;     HPI  from the history and physical done on the day of admission:   Chief Complaint: Chest pain   HPI: Jim Dunn is a 58 y.o. male with medical history significant of COPD, CHF, seizure disorder, pulm vascular failure, tobacco abuse who presents emergency department due to right-sided chest pain that woke him up this morning.  Pain was sharp, pleuritic, nonradiating and was associated with productive cough.  Patient continues to smoke.  He denies nausea, vomiting, shortness of breath.   ED Course:  In the emergency department, he was hemodynamically stable.  Workup  in the ED showed microcytic anemia.  BMP was normal except for hyponatremia (123), chloride 86, glucose 131.  Troponin times was flat at 5. Chest x-ray showed cardiomegaly with central pulmonary vascular congestion.  Mild patchy airspace opacities in the bilateral lower lungs may represent edema or infection. Patient was treated with IV ceftriaxone  and azithromycin , breathing treatment was provided, IV morphine  4 mg x 1 was given, IV hydration was provided.  [facility] was asked to admit patient.   Review of Systems: Review of systems as noted in the HPI. All other systems reviewed and are negative.    Hospital Course:     1)MRSA bacteremia: Aerobic bottle (1 of 4 total) on 01/20/24.  - Started on iv  vancomycin  on   01/21/24 -- Surveillance blood cultures redrawn 01/23/24 Negative -Remains afebrile, -No leukocytosis - TTE without vegetation.  -TEE on 01/31/2024 without vegetations--discussed with ID physician Dr. Liane Redman recommends IV daptomycin through 02/05/2024 -Started on IV Daptomycin 700 mg daily on 01/31/2024--continue these through 02/05/2024 inclusive   2)Community-acquired pneumonia:  - Completed 5 days CTX/azithro --Continue as needed bronchodilators and mucolytics   3)COPD: No acute exacerbation at this time- prn bronchodilators and mucolytics   4)Hyponatremia: Has been waxing/waning, appears asymptomatic, chronic problem. Nadir 124, stable on recheck. Labs consistent with SIADH.  - Improving with fluid restriction and salt tabs which we will continue. -Sodium currently up to 129 -Weekly BMP advised   5)Chronic HFimpEF: BNP 29, appears euvolemic, echo shows EF 60%,  - Continue coreg , digoxin , entresto . --Monitor closely with salt tablet use-risk of fluid overload   6)HTN:  - Continue norvasc , coreg , entresto .   HLD:  - Continue atorvastatin    Anxiety/depression/mood disorder - Continue buspirone , Prozac  ,Remeron  and prn alprazolam .   Seizure disorder: - Continue Depakote ,   Morbid Obesity- -Low calorie diet, portion control and increase physical activity discussed with patient -Body mass index is 42.57 kg/m.   Tobacco abuse:  - -Smoking Cessation advised - Nicotine  patch   Discharge Condition: stable  Follow UP   Contact information for after-discharge care     Destination     [facility] .   Service: Skilled Nursing Contact information: [address] [place]  [phone]                     Consults obtained - ID and cardiology  Diet and Activity recommendation:  As advised  Discharge Instructions    Discharge Instructions     [facility] pharmacist to adjust dose for Vancomycin ,  Aminoglycosides and other anti-infective therapies as requested by physician.   Complete by: As directed    [facility] to provide Cath Flo 2mg    Complete by: As directed    Administer for PICC line occlusion and as ordered by physician for other access device issues.   Anaphylaxis Kit: Provided to treat any anaphylactic reaction to the medication being provided to the patient if First Dose or when requested by physician   Complete by: As directed    Epinephrine  1mg /ml vial / amp: Administer 0.3mg  (0.10ml) subcutaneously once for moderate to severe anaphylaxis, nurse to call physician and pharmacy when reaction occurs and call 911 if needed for immediate care   Diphenhydramine  50mg /ml IV vial: Administer 25-50mg  IV/IM PRN for first dose reaction, rash, itching, mild reaction, nurse to call physician and pharmacy when reaction occurs   Sodium Chloride  0.9% NS 500ml IV: Administer if needed for hypovolemic blood pressure drop  or as ordered by physician after call to physician with anaphylactic reaction   Call MD for:  difficulty breathing, headache or visual disturbances   Complete by: As directed    Call MD for:  persistant dizziness or light-headedness   Complete by: As directed    Call MD for:  persistant nausea and vomiting   Complete by: As directed    Call MD for:  temperature >100.4   Complete by: As directed    Change dressing on IV access line weekly and PRN   Complete by: As directed    Diet - low sodium heart healthy   Complete by: As directed    Discharge instructions   Complete by: As directed    1)IV daptomycin 700 mg daily through 02/05/2024 inclusive ---Indication:  MRSA bacteremia First Dose: Yes Last Day of Therapy:  02/05/24 Labs - Once weekly:  CBC/D, BMP, and CPK Labs - Once weekly: ESR and CRP Method of administration: IV Push Method of administration may be changed at the discretion of home infusion pharmacist based upon assessment of the patient and/or  caregiver's ability to self-administer the medication ordered.  2)Very Low-salt diet advised---Less than 2 gm of Sodium per day advised----ok to use Mrs DASH salt substitute instead of Salt   Flush IV access with Sodium Chloride  0.9% and Heparin 10 units/ml or 100 units/ml   Complete by: As directed    Home infusion instructions - [facility]   Complete by: As directed    Instructions: Flush IV access with Sodium Chloride  0.9% and Heparin 10units/ml or 100units/ml   Change dressing on IV access line: Weekly and PRN   Instructions Cath Flo 2mg : Administer for PICC Line occlusion and as ordered by physician for other access device   [facility] pharmacist to adjust dose for: Vancomycin , Aminoglycosides and other anti-infective therapies as requested by physician   Increase activity slowly   Complete by: As directed    Method of administration may be changed at the discretion of home infusion pharmacist based upon assessment of the patient and/or caregiver's ability to self-administer the medication ordered   Complete by: As directed    No wound care   Complete by: As directed        Discharge Medications     Allergies as of 02/01/2024   No Known Allergies      Medication List     STOP taking these medications    hydrochlorothiazide  12.5 MG capsule Commonly known as: MICROZIDE    Melatonin 10 MG Subl       TAKE these medications    acetaminophen  325 MG tablet Commonly known as: TYLENOL  Take 2 tablets (650 mg total) by mouth every 6 (six) hours as needed for mild pain (pain score 1-3) or fever (or Fever >/= 101).   albuterol  (2.5 MG/3ML) 0.083% nebulizer solution Commonly known as: PROVENTIL  Inhale 3 mLs (2.5 mg total) into the lungs every 4 (four) hours as needed for wheezing or shortness of breath.   ALPRAZolam  1 MG tablet Commonly known as: XANAX  Take 1 tablet (1 mg total) by mouth 2 (two) times daily as needed for anxiety. What changed:  when  to take this reasons to take this   amLODipine  10 MG tablet Commonly known as: NORVASC  Take 1 tablet (10 mg total) by mouth daily.   atorvastatin  40 MG tablet Commonly known as: LIPITOR Take 1 tablet (40 mg total) by mouth daily.   busPIRone  15 MG tablet Commonly known as:  BUSPAR  Take 1 tablet (15 mg total) by mouth 3 (three) times daily.   carvedilol  25 MG tablet Commonly known as: COREG  Take 1 tablet (25 mg total) by mouth 2 (two) times daily with a meal.   daptomycin 700-0.9 MG/100ML-% Soln Commonly known as: CUBICIN Inject 100 mLs (700 mg total) into the vein daily at 2 PM.   daptomycin IVPB Commonly known as: CUBICIN Inject 700 mg into the vein daily for 5 days. Indication:  MRSA bacteremia First Dose: Yes Last Day of Therapy:  02/05/24 Labs - Once weekly:  CBC/D, BMP, and CPK Labs - Once weekly: ESR and CRP Method of administration: IV Push Method of administration may be changed at the discretion of home infusion pharmacist based upon assessment of the patient and/or caregiver's ability to self-administer the medication ordered.   digoxin  0.25 MG tablet Commonly known as: LANOXIN  Take 1 tablet (0.25 mg total) by mouth daily.   divalproex  500 MG DR tablet Commonly known as: DEPAKOTE  Take 1 tablet (500 mg total) by mouth 2 (two) times daily.   Entresto  97-103 MG Generic drug: sacubitril -valsartan  Take 1 tablet by mouth 2 (two) times daily.   FLUoxetine  20 MG capsule Commonly known as: PROZAC  Take 20 mg by mouth daily.   guaiFENesin  600 MG 12 hr tablet Commonly known as: Mucinex  Take 1 tablet (600 mg total) by mouth 2 (two) times daily.   Invega  Sustenna 234 MG/1.5ML injection Generic drug: paliperidone  Inject 234 mg into the muscle every 30 (thirty) days.   metoprolol  succinate 50 MG 24 hr tablet Commonly known as: TOPROL -XL Take 1 tablet (50 mg total) by mouth daily.   mirtazapine  15 MG tablet Commonly known as: REMERON  Take 1 tablet (15 mg total) by  mouth at bedtime.   nicotine  21 mg/24hr patch Commonly known as: NICODERM CQ  - dosed in mg/24 hours Place 1 patch (21 mg total) onto the skin daily. Start taking on: Feb 02, 2024   paliperidone  9 MG 24 hr tablet Commonly known as: INVEGA  Take 9 mg by mouth daily.   polyethylene glycol powder 17 GM/SCOOP powder Commonly known as: GLYCOLAX /MIRALAX  Take 17 g by mouth daily. What changed: when to take this   pregabalin  150 MG capsule Commonly known as: LYRICA  Take 1 capsule (150 mg total) by mouth 2 (two) times daily.   QUEtiapine  50 MG tablet Commonly known as: SEROQUEL  Take 1 tablet (50 mg total) by mouth at bedtime.   senna-docusate 8.6-50 MG tablet Commonly known as: Senokot-S Take 2 tablets by mouth 2 (two) times daily.   sodium chloride  1 g tablet Take 1 tablet (1 g total) by mouth 3 (three) times daily with meals.   traZODone  50 MG tablet Commonly known as: DESYREL  Take 1 tablet (50 mg total) by mouth at bedtime.               Discharge Care Instructions  (From admission, onward)           Start     Ordered   02/01/24 0000  Change dressing on IV access line weekly and PRN  (Home infusion instructions - [facility] )        02/01/24 1158            Major procedures and Radiology Reports - PLEASE review detailed and final reports for all details, in brief -   US  EKG SITE RITE Result Date: 02/01/2024 If Site Rite image not attached, placement could not be confirmed due to current cardiac  rhythm.  ECHO TEE Result Date: 01/31/2024    TRANSESOPHOGEAL ECHO REPORT   Patient Name:   Jim Dunn Date of Exam: 01/31/2024 Medical Rec #:  [id number]   Height:       68.0 in Accession #:    [id number]  Weight:       267.4 lb Date of Birth:  02/18/1966    BSA:          2.312 m Patient Age:    57 years    BP:           189/98 mmHg Patient Gender: M           HR:           67 bpm. Exam Location:  [facility] Procedure: Transesophageal Echo, Cardiac Doppler,  Color Doppler and Saline            Contrast Bubble Study (Both Spectral and Color Flow Doppler were            utilized during procedure). Indications:    Bacteremia  History:        Patient has prior history of Echocardiogram examinations, most                 recent 01/21/2024. CHF, COPD; Risk Factors:Current Smoker,                 Hypertension and Dyslipidemia.  Sonographer:    Denese Finn RCS Referring Phys: [id number] Metta Actis PROCEDURE: The transesophogeal probe was passed without difficulty through the esophogus of the patient. Sedation performed by different physician. The patient developed no complications during the procedure.  IMPRESSIONS  1. Left ventricular ejection fraction, by estimation, is 60 to 65%. The left ventricle has normal function.  2. Right ventricular systolic function is normal. The right ventricular size is normal.  3. No left atrial/left atrial appendage thrombus was detected.  4. The mitral valve is normal in structure. Trivial mitral valve regurgitation.  5. The aortic valve is tricuspid. Aortic valve regurgitation is not visualized. Aortic valve sclerosis is present, with no evidence of aortic valve stenosis. FINDINGS  Left Ventricle: Left ventricular ejection fraction, by estimation, is 60 to 65%. The left ventricle has normal function. The left ventricular internal cavity size was normal in size. Right Ventricle: The right ventricular size is normal. Right vetricular wall thickness was not assessed. Right ventricular systolic function is normal. Left Atrium: Left atrial size was normal in size. No left atrial/left atrial appendage thrombus was detected. Right Atrium: Right atrial size was normal in size. Pericardium: There is no evidence of pericardial effusion. Mitral Valve: The mitral valve is normal in structure. Trivial mitral valve regurgitation. Tricuspid Valve: The tricuspid valve is normal in structure. Tricuspid valve regurgitation is trivial. Aortic Valve: The  aortic valve is tricuspid. Aortic valve regurgitation is not visualized. Aortic valve sclerosis is present, with no evidence of aortic valve stenosis. Pulmonic Valve: The pulmonic valve was normal in structure. Pulmonic valve regurgitation is not visualized. Aorta: The aortic root and ascending aorta are structurally normal, with no evidence of dilitation. IAS/Shunts: No atrial level shunt detected by color flow Doppler. Agitated saline contrast was given intravenously to evaluate for intracardiac shunting. Ola Berger MD Electronically signed by Ola Berger MD Signature Date/Time: 01/31/2024/2:41:33 PM    Final    ECHOCARDIOGRAM COMPLETE Result Date: 01/21/2024    ECHOCARDIOGRAM REPORT   Patient Name:   Jim Dunn Date of Exam: 01/21/2024 Medical Rec #:  [id number]  Height:       68.0 in Accession #:    [id number]  Weight:       267.4 lb Date of Birth:  1966/04/14    BSA:          2.312 m Patient Age:    57 years    BP:           99/75 mmHg Patient Gender: M           HR:           65 bpm. Exam Location:  [facility] Procedure: 2D Echo, Cardiac Doppler, Color Doppler and Intracardiac            Opacification Agent (Both Spectral and Color Flow Doppler were            utilized during procedure). Indications:    Congestive Heart Failure I50.9  History:        Patient has prior history of Echocardiogram examinations, most                 recent 09/26/2021. CHF, COPD; Risk Factors:Hypertension,                 Dyslipidemia and Current Smoker.  Sonographer:    Denese Finn RCS Referring Phys: [id number] [facility]  Sonographer Comments: Technically difficult study due to poor echo windows. Image acquisition challenging due to respiratory motion and Image acquisition challenging due to patient body habitus. IMPRESSIONS  1. Left ventricular ejection fraction, by estimation, is 55 to 60%. The left ventricle has normal function. The left ventricle has no regional wall motion abnormalities. Left ventricular diastolic  parameters are consistent with Grade I diastolic dysfunction (impaired relaxation).  2. Right ventricular systolic function is normal. The right ventricular size is normal. Tricuspid regurgitation signal is inadequate for assessing PA pressure.  3. The mitral valve is normal in structure. No evidence of mitral valve regurgitation. No evidence of mitral stenosis.  4. The aortic valve is tricuspid. Aortic valve regurgitation is not visualized. No aortic stenosis is present. FINDINGS  Left Ventricle: Left ventricular ejection fraction, by estimation, is 55 to 60%. The left ventricle has normal function. The left ventricle has no regional wall motion abnormalities. Definity  contrast agent was given IV to delineate the left ventricular  endocardial borders. The left ventricular internal cavity size was normal in size. There is no left ventricular hypertrophy. Left ventricular diastolic parameters are consistent with Grade I diastolic dysfunction (impaired relaxation). Normal left ventricular filling pressure. Right Ventricle: The right ventricular size is normal. Right vetricular wall thickness was not well visualized. Right ventricular systolic function is normal. Tricuspid regurgitation signal is inadequate for assessing PA pressure. Left Atrium: Left atrial size was normal in size. Right Atrium: Right atrial size was normal in size. Pericardium: There is no evidence of pericardial effusion. Mitral Valve: The mitral valve is normal in structure. No evidence of mitral valve regurgitation. No evidence of mitral valve stenosis. Tricuspid Valve: The tricuspid valve is normal in structure. Tricuspid valve regurgitation is not demonstrated. No evidence of tricuspid stenosis. Aortic Valve: The aortic valve is tricuspid. Aortic valve regurgitation is not visualized. No aortic stenosis is present. Aortic valve mean gradient measures 4.5 mmHg. Aortic valve peak gradient measures 10.5 mmHg. Aortic valve area, by VTI measures 1.87   cm. Pulmonic Valve: The pulmonic valve was not well visualized. Pulmonic valve regurgitation is not visualized. No evidence of pulmonic stenosis. Aorta: The aortic root is normal in size and structure. Venous:  The inferior vena cava was not well visualized. IAS/Shunts: No atrial level shunt detected by color flow Doppler.  LEFT VENTRICLE PLAX 2D LVIDd:         5.80 cm   Diastology LVIDs:         4.20 cm   LV e' medial:    4.57 cm/s LV PW:         1.00 cm   LV E/e' medial:  12.8 LV IVS:        0.90 cm   LV e' lateral:   6.53 cm/s LVOT diam:     2.00 cm   LV E/e' lateral: 9.0 LV SV:         52 LV SV Index:   23 LVOT Area:     3.14 cm  RIGHT VENTRICLE RV S prime:     11.90 cm/s TAPSE (M-mode): 2.3 cm LEFT ATRIUM             Index        RIGHT ATRIUM           Index LA diam:        4.65 cm 2.01 cm/m   RA Area:     17.50 cm LA Vol (A2C):   47.2 ml 20.41 ml/m  RA Volume:   48.90 ml  21.15 ml/m LA Vol (A4C):   79.1 ml 34.21 ml/m LA Biplane Vol: 61.1 ml 26.42 ml/m  AORTIC VALVE AV Area (Vmax):    1.78 cm AV Area (Vmean):   1.96 cm AV Area (VTI):     1.87 cm AV Vmax:           162.14 cm/s AV Vmean:          93.975 cm/s AV VTI:            0.278 m AV Peak Grad:      10.5 mmHg AV Mean Grad:      4.5 mmHg LVOT Vmax:         91.80 cm/s LVOT Vmean:        58.700 cm/s LVOT VTI:          0.166 m LVOT/AV VTI ratio: 0.60  AORTA Ao Root diam: 3.20 cm MITRAL VALVE MV Area (PHT): 2.42 cm    SHUNTS MV Decel Time: 313 msec    Systemic VTI:  0.17 m MV E velocity: 58.70 cm/s  Systemic Diam: 2.00 cm MV A velocity: 86.30 cm/s MV E/A ratio:  0.68 Armida Lander MD Electronically signed by Armida Lander MD Signature Date/Time: 01/21/2024/6:45:56 PM    Final    DG Chest 2 View Result Date: 01/19/2024 CLINICAL DATA:  Chest pain EXAM: CHEST - 2 VIEW COMPARISON:  Chest x-ray 07/27/2023 FINDINGS: The heart is enlarged. There is central pulmonary vascular congestion. There are mild patchy airspace opacities in the bilateral lower  lungs. There is no pleural effusion or pneumothorax. No acute fractures are seen. IMPRESSION: 1. Cardiomegaly with central pulmonary vascular congestion. 2. Mild patchy airspace opacities in the bilateral lower lungs may represent edema or infection. Electronically Signed   By: Tyron Gallon M.D.   On: 01/19/2024 23:03    Micro Results   Recent Results (from the past 240 hours)  Culture, blood (Routine X 2) w Reflex to ID Panel     Status: None   Collection Time: 01/23/24  8:45 AM   Specimen: BLOOD  Result Value Ref Range Status   Specimen Description BLOOD SITE NOT SPECIFIED  Final  Special Requests   Final    BOTTLES DRAWN AEROBIC ONLY Blood Culture adequate volume   Culture   Final    NO GROWTH 5 DAYS Performed at [facility], [address] [place], [place] [address]    Report Status 01/28/2024 FINAL  Final  Culture, blood (Routine X 2) w Reflex to ID Panel     Status: None   Collection Time: 01/23/24  8:50 AM   Specimen: BLOOD  Result Value Ref Range Status   Specimen Description BLOOD SITE NOT SPECIFIED  Final   Special Requests   Final    BOTTLES DRAWN AEROBIC ONLY Blood Culture adequate volume   Culture   Final    NO GROWTH 5 DAYS Performed at [facility], [address] [place], [place] [address]    Report Status 01/28/2024 FINAL  Final  MIC (1 Drug)-Blood culture; 01/20/2024; BLOOD RIGHT HAND; MRSA; Daptomycin     Status: Abnormal   Collection Time: 01/24/24  8:01 AM   Specimen: BLOOD RIGHT HAND  Result Value Ref Range Status   Min Inhibitory Conc (1 Drug) Final report (A)  Corrected    Comment: (NOTE) Performed At: [facility] [address] [place], [place] [id number] Pearlean Botts MD WG:[id number] CORRECTED ON 05/28 AT 1335: PREVIOUSLY REPORTED AS Preliminary report    Source BLOOD  Final    Comment: Performed at [facility], [address], [place], [place] [address]  MIC Result     Status: Abnormal   Collection Time: 01/24/24  8:01 AM  Result  Value Ref Range Status   Result 1 (MIC) Comment (A)  Final    Comment: (NOTE) Methicillin - resistant Staphylococcus aureus Identification performed by account, not confirmed by this laboratory. Testing performed by a combination of broth microdilution and gradient elution. DAPTOMYCIN  0.38 ug/mL  SUSCEPTIBLE Performed At: [facility] [address] [place], [place] [id number] Pearlean Botts MD KG:[id number]     Today   Subjective    Jim Dunn today has no new complaints No fever  Or chills   No Nausea, Vomiting or Diarrhea Eating and drinking well         Patient has been seen and examined prior to discharge   Objective   Blood pressure (!) 152/89, pulse 66, temperature 98.6 F (37 C), resp. rate 18, height 5\' 8"  (1.727 m), weight 127 kg, SpO2 92%.   Intake/Output Summary (Last 24 hours) at 02/01/2024 1244 Last data filed at 02/01/2024 0900 Gross per 24 hour  Intake 2679.25 ml  Output --  Net 2679.25 ml    Exam Gen:- Awake Alert, no acute distress  HEENT:- [place].AT, No sclera icterus Neck-Supple Neck,No JVD,.  Lungs-  CTAB , good air movement bilaterally CV- S1, S2 normal, regular Abd-  +ve B.Sounds, Abd Soft, No tenderness, increased truncal adiposity Extremity/Skin:- No  edema,   good pulses Psych-affect is appropriate, oriented x3 Neuro-no new focal deficits, no tremors  MSK-right arm PICC line in situ   Data Review   CBC w Diff:  Lab Results  Component Value Date   WBC 7.7 01/31/2024   HGB 10.3 (L) 01/31/2024   HCT 31.5 (L) 01/31/2024   HCT 36.0 (L) 03/11/2020   PLT 223 01/31/2024   LYMPHOPCT 33 01/25/2023   MONOPCT 12 01/25/2023   EOSPCT 1 01/25/2023   BASOPCT 1 01/25/2023   CMP:  Lab Results  Component Value Date   NA 129 (L) 01/31/2024   K 3.9 01/31/2024   CL 93 (  L) 01/31/2024   CO2 26 01/31/2024   BUN 6 01/31/2024   CREATININE 0.48 (L) 02/01/2024   PROT 6.9 01/24/2024   ALBUMIN 3.2 (L) 01/31/2024   BILITOT 0.6 01/24/2024    ALKPHOS 57 01/24/2024   AST 11 (L) 01/24/2024   ALT 9 01/24/2024   Total Discharge time is about 33 minutes  Colin Dawley M.D on 02/01/2024 at 12:44 PM  Go to [email or web address] -  for contact info  [facility] - Office  [phone]

## 2024-02-01 NOTE — TOC Transition Note (Addendum)
 Transition of Care [facility]) - Discharge Note   Patient Details  Name: Jim Dunn MRN: [id number] Date of Birth: [birth year]  [facility]) CM/SW Contact:  Orelia Binet, RN Phone Number: 02/01/2024, 12:48 PM   Clinical Narrative:   ID orders completed, PICC line intact. DC summary completed and sent to [place]. Jyl Or and [place] updated, room provided. RN calling report. TOC called EMS. RN called Legal Guardian.    Addendum:  [facility] transport scheduled   Final next level of care: Skilled Nursing Facility Barriers to Discharge: Barriers Resolved   Patient Goals and CMS Choice Patient states their goals for this hospitalization and ongoing recovery are:: SNF CMS Medicare.gov Compare Post Acute Care list provided to:: Patient Choice offered to / list presented to : [facility] POA / Guardian     Discharge Placement               Patient to be transferred to facility by: EMS   Patient and family notified of of transfer: 02/01/24  Discharge Plan and Services Additional resources added to the After Visit Summary for          Social Drivers of Health (SDOH) Interventions SDOH Screenings   Food Insecurity: No Food Insecurity (01/20/2024)  Housing: Low Risk  (01/20/2024)  Transportation Needs: No Transportation Needs (01/20/2024)  Utilities: Not At Risk (01/20/2024)  Social Connections: Socially Isolated (01/20/2024)  Tobacco Use: High Risk (01/31/2024)     Readmission Risk Interventions    01/18/2022    2:30 PM  Readmission Risk Prevention Plan  Transportation Screening Complete  Medication Review (RN Care Manager) Complete  PCP or Specialist appointment within 3-5 days of discharge Complete  HRI or Home Care Consult Complete  SW Recovery Care/Counseling Consult Complete  Palliative Care Screening Not Applicable  Skilled Nursing Facility Not Applicable

## 2024-02-01 NOTE — Care Management Important Message (Signed)
 Important Message  Patient Details  Name: Tadeo Besecker MRN: [id number] Date of Birth: 1965-12-17   Important Message Given:  Yes - Medicare IM (copy mailed to address on file)     Neila Bally 02/01/2024, 10:28 AM

## 2024-02-01 NOTE — Progress Notes (Signed)
 Mobility Specialist Progress Note:    02/01/24 0920  Mobility  Activity Ambulated with assistance in hallway  Level of Assistance Independent  Assistive Device None  Distance Ambulated (ft) 160 ft  Range of Motion/Exercises Active;All extremities  Activity Response Tolerated well  Mobility Referral Yes  Mobility visit 1 Mobility  Mobility Specialist Start Time (ACUTE ONLY) 0920  Mobility Specialist Stop Time (ACUTE ONLY) [id number]  Mobility Specialist Time Calculation (min) (ACUTE ONLY) 19 min   Pt received in bed, agreeable to mobility. Independently able to stand and ambulate with no AD. Tolerated well,asx throughout. Returned pt supine, all needs met.  Jim Dunn Mobility Specialist Please contact via Special educational needs teacher or  Rehab office at [phone]

## 2024-02-03 NOTE — Anesthesia Postprocedure Evaluation (Signed)
 Anesthesia Post Note  Patient: Jim Dunn  Procedure(s) Performed: ECHOCARDIOGRAM, TRANSESOPHAGEAL  Patient location during evaluation: Phase II Anesthesia Type: General Level of consciousness: awake Pain management: pain level controlled Vital Signs Assessment: post-procedure vital signs reviewed and stable Respiratory status: spontaneous breathing and respiratory function stable Cardiovascular status: blood pressure returned to baseline and stable Postop Assessment: no headache and no apparent nausea or vomiting Anesthetic complications: no Comments: Late entry   No notable events documented.   Last Vitals:  Vitals:   01/31/24 2014 02/01/24 0535  BP: (!) 174/75 (!) 152/89  Pulse: 69 66  Resp: 18 18  Temp: 37.1 C 37 C  SpO2: 97% 92%    Last Pain:  Vitals:   02/01/24 0941  TempSrc:   PainSc: 2                  Coretha Dew

## 2024-02-04 ENCOUNTER — Emergency Department
Admission: EM | Admit: 2024-02-04 | Discharge: 2024-02-04 | Disposition: A | Discharge status: Other Acute Inpatient Hospital | Attending: Emergency Medicine | Admitting: Emergency Medicine

## 2024-02-04 ENCOUNTER — Emergency Department

## 2024-02-04 ENCOUNTER — Encounter: Payer: Self-pay | Admitting: Medical Oncology

## 2024-02-04 ENCOUNTER — Other Ambulatory Visit: Payer: Self-pay

## 2024-02-04 DIAGNOSIS — I509 Heart failure, unspecified: Secondary | ICD-10-CM | POA: Diagnosis not present

## 2024-02-04 DIAGNOSIS — T424X5A Adverse effect of benzodiazepines, initial encounter: Secondary | ICD-10-CM | POA: Diagnosis not present

## 2024-02-04 DIAGNOSIS — T887XXA Unspecified adverse effect of drug or medicament, initial encounter: Secondary | ICD-10-CM

## 2024-02-04 DIAGNOSIS — J449 Chronic obstructive pulmonary disease, unspecified: Secondary | ICD-10-CM | POA: Diagnosis not present

## 2024-02-04 DIAGNOSIS — R400 Somnolence: Secondary | ICD-10-CM | POA: Insufficient documentation

## 2024-02-04 LAB — URINALYSIS, ROUTINE W REFLEX MICROSCOPIC
Bilirubin Urine: NEGATIVE
Glucose, UA: NEGATIVE mg/dL
Hgb urine dipstick: NEGATIVE
Ketones, ur: NEGATIVE mg/dL
Leukocytes,Ua: NEGATIVE
Nitrite: NEGATIVE
Protein, ur: NEGATIVE mg/dL
Specific Gravity, Urine: 1.015 (ref 1.005–1.030)
pH: 5 (ref 5.0–8.0)

## 2024-02-04 LAB — COMPREHENSIVE METABOLIC PANEL WITH GFR
ALT: 12 U/L (ref 0–44)
AST: 10 U/L — ABNORMAL LOW (ref 15–41)
Albumin: 3.4 g/dL — ABNORMAL LOW (ref 3.5–5.0)
Alkaline Phosphatase: 50 U/L (ref 38–126)
Anion gap: 8 (ref 5–15)
BUN: 19 mg/dL (ref 6–20)
CO2: 27 mmol/L (ref 22–32)
Calcium: 8.4 mg/dL — ABNORMAL LOW (ref 8.9–10.3)
Chloride: 93 mmol/L — ABNORMAL LOW (ref 98–111)
Creatinine, Ser: 0.96 mg/dL (ref 0.61–1.24)
GFR, Estimated: >60 mL/min
Glucose, Bld: 82 mg/dL (ref 70–99)
Potassium: 3.9 mmol/L (ref 3.5–5.1)
Sodium: 128 mmol/L — ABNORMAL LOW (ref 135–145)
Total Bilirubin: 0.6 mg/dL (ref 0.0–1.2)
Total Protein: 6.5 g/dL (ref 6.5–8.1)

## 2024-02-04 LAB — CBC
HCT: 31.5 % — ABNORMAL LOW (ref 39.0–52.0)
Hemoglobin: 10.3 g/dL — ABNORMAL LOW (ref 13.0–17.0)
MCH: 22.7 pg — ABNORMAL LOW (ref 26.0–34.0)
MCHC: 32.7 g/dL (ref 30.0–36.0)
MCV: 69.5 fL — ABNORMAL LOW (ref 80.0–100.0)
Platelets: 221 10*3/uL (ref 150–400)
RBC: 4.53 MIL/uL (ref 4.22–5.81)
RDW: 16.7 % — ABNORMAL HIGH (ref 11.5–15.5)
WBC: 7.8 10*3/uL (ref 4.0–10.5)
nRBC: 0 % (ref 0.0–0.2)

## 2024-02-04 LAB — TROPONIN I (HIGH SENSITIVITY)
Troponin I (High Sensitivity): 4 ng/L (ref <18)
Troponin I (High Sensitivity): 6 ng/L (ref <18)

## 2024-02-04 LAB — LACTIC ACID, PLASMA: Lactic Acid, Venous: 0.8 mmol/L (ref 0.5–1.9)

## 2024-02-04 LAB — AMMONIA: Ammonia: 51 umol/L — ABNORMAL HIGH (ref 9–35)

## 2024-02-04 LAB — CBG MONITORING, ED: Glucose-Capillary: 82 mg/dL (ref 70–99)

## 2024-02-04 NOTE — ED Notes (Signed)
 [facility]  CALLED FOR  TRANSPORT TO  [facility]

## 2024-02-04 NOTE — ED Triage Notes (Signed)
 Pt from [facility] via ems, per staff at facility pt has been A/O x 4 and ambulatory. Pt at facility since 5/28 for IV ABX thru PICC for MRSA. Pt A/O x 4, responsive to verbal stimuli. Pt keeps asking, "are yall sending me back to  health care".

## 2024-02-04 NOTE — ED Notes (Signed)
 Pt assisted with urinal. Sandwich tray given

## 2024-02-04 NOTE — ED Notes (Signed)
 Legal guardian Basel Defalco updated and notified of pending discharge. Lance Huaracha stated she will notify facility.

## 2024-02-04 NOTE — ED Provider Notes (Signed)
 [facility] Provider Note    Event Date/Time   First MD Initiated Contact with Patient 02/04/24 1236     (approximate)   History   lethargic   HPI  Jim Dunn is a 58 y.o. male with history of seizure disorder, CHF, hyperlipidemia, COPD, presenting with increased sleepiness.  Patient has no complaints, he denies any nausea, vomiting, diarrhea, chest pain, shortness of breath, coughing, nausea, vomiting, diarrhea, weakness, numbness, back pain, abdominal pain.  States no trauma or falls recently.  Patient states that he does not know why he is here, states that he is sleepy because he was given Xanax .  Per independent history from EMS, patient is coming from [facility], is on IV antibiotics through a right upper extremity PICC for MRSA bacteremia.  They were concerned that he was more lethargic than usual.   On independent chart review, patient was admitted in May for MRSA bacteremia, is on IV daptomycin  till 6/3.  Had a TEE done that did not show any vegetations.  Also completed a course of antibiotics for pneumonia.  Also with history of hyponatremia secondary to SIADH.  Physical Exam   Triage Vital Signs: ED Triage Vitals  Encounter Vitals Group     BP 02/04/24 1242 112/79     Systolic BP Percentile --      Diastolic BP Percentile --      Pulse Rate 02/04/24 1240 71     Resp 02/04/24 1240 20     Temp 02/04/24 1241 98.1 F (36.7 C)     Temp Source 02/04/24 1241 Oral     SpO2 02/04/24 1240 100 %     Weight 02/04/24 1241 279 lb 15.8 oz (127 kg)     Height 02/04/24 1241 5\' 8"  (1.727 m)     Head Circumference --      Peak Flow --      Pain Score 02/04/24 1241 0     Pain Loc --      Pain Education --      Exclude from Growth Chart --     Most recent vital signs: Vitals:   02/04/24 1330 02/04/24 1400  BP: 121/79 127/83  Pulse: 66 69  Resp:    Temp:    SpO2: 99% 98%     General: Sleeping, wakes up easily to voice, ANO x  3 CV:  Good peripheral perfusion.  Resp:  Normal effort.  Clear Abd:  No distention.  Nontender Other:  Pupils are equal and reactive, extraocular movements are intact, no cranial nerve deficits, no focal weakness or numbness, PICC line in place to the right upper extremity without any surrounding erythema, tenderness, swelling   ED Results / Procedures / Treatments   Labs (all labs ordered are listed, but only abnormal results are displayed) Labs Reviewed  COMPREHENSIVE METABOLIC PANEL WITH GFR - Abnormal; Notable for the following components:      Result Value   Sodium 128 (*)    Chloride 93 (*)    Calcium  8.4 (*)    Albumin 3.4 (*)    AST 10 (*)    All other components within normal limits  CBC - Abnormal; Notable for the following components:   Hemoglobin 10.3 (*)    HCT 31.5 (*)    MCV 69.5 (*)    MCH 22.7 (*)    RDW 16.7 (*)    All other components within normal limits  AMMONIA - Abnormal; Notable for the following components:  Ammonia 51 (*)    All other components within normal limits  CULTURE, BLOOD (ROUTINE X 2)  CULTURE, BLOOD (ROUTINE X 2)  LACTIC ACID, PLASMA  URINALYSIS, ROUTINE W REFLEX MICROSCOPIC  CBG MONITORING, ED  TROPONIN I (HIGH SENSITIVITY)  TROPONIN I (HIGH SENSITIVITY)     EKG  EKG shows, sinus rhythm, rate of 70, normal QRS, prolonged QTc, T wave flattening to 1, aVL, inversions to V4 through V6, no ischemic ST elevation, not significantly change compared to prior   RADIOLOGY On my independent interpretation, CT head without obvious intracranial hemorrhage   PROCEDURES:  Critical Care performed: No  Procedures   MEDICATIONS ORDERED IN ED: Medications - No data to display   IMPRESSION / MDM / ASSESSMENT AND PLAN / ED COURSE  I reviewed the triage vital signs and the nursing notes.                              Differential diagnosis includes, but is not limited to, medication side effect, occult infection, electrolyte  derangements, atypical ACS.  Will get labs, EKG, troponin, chest x-ray, CT head, UA.  Observation in the emergency department.  Patient's presentation is most consistent with acute presentation with potential threat to life or bodily function.  Independent interpretation of labs and imaging below.  On reassessment patient was sleeping but easily wakes up to voice, he denies any cough or shortness of breath, chest x-ray showed patchy airspace disease in the right upper lung, suspect this might be atelectasis instead of pneumonia given that he is no fever, or other symptoms.  He has no asterixis on exam.  His ammonia level is 51.  Doubt hyperammonemia at this time.  Patient signed out to oncoming team pending UA, CT head results, reassessment, likely able to be discharged back to his facility.  The patient is on the cardiac monitor to evaluate for evidence of arrhythmia and/or significant heart rate changes.   Clinical Course as of 02/04/24 1550  Mon Feb 04, 2024  1521 DG Chest 1 View IMPRESSION: 1. Cardiomegaly with increasing vascular congestion and peribronchial cuffing that may be congestive. 2. Minimal patchy airspace disease in the right upper lung zone, may be atelectasis or pneumonia.  Patient denies any shortness of breath, chest pain, cough at this time. [TT]  1521 Independent review of labs, no leukocytosis, hyponatremia noted, electrolytes not severely deranged, creatinine is normal, lactate is normal, troponin is not elevated. [TT]    Clinical Course User Index [TT] Drenda Gentle Richard Champion, MD     FINAL CLINICAL IMPRESSION(S) / ED DIAGNOSES   Final diagnoses:  Sleepiness  Medication side effect     Rx / DC Orders   ED Discharge Orders     None        Note:  This document was prepared using Dragon voice recognition software and may include unintentional dictation errors.    Shane Darling, MD 02/04/24 [phone]

## 2024-02-04 NOTE — ED Provider Notes (Signed)
 Patient received in signout from Dr. Drenda Gentle pending UA, CT head and reassessment.  Patient presented with lethargy and sleepiness in the setting of Xanax  administration.  At a local SNF with a PICC line receiving daptomycin  for MRSA bacteremia, 1 day remaining on this antibiotic course.  Reassuring remainder of workup with UA not demonstrating infectious features, CT head without acute features.  I reassessed the patient he reports feeling fine and requesting discharge as today is his birthday and he has family wants to visit him.  Patient suitable for outpatient management   Arline Bennett, MD 02/04/24 [phone]

## 2024-02-09 LAB — CULTURE, BLOOD (ROUTINE X 2)
Culture: NO GROWTH
Culture: NO GROWTH

## 2024-07-05 DEATH — deceased
